# Patient Record
Sex: Female | Born: 1975
Health system: Southern US, Community
[De-identification: ages and names within clinical notes are randomized; demographics above are authoritative.]

## PROBLEM LIST (undated history)

## (undated) VITALS — BP 113/79 | HR 83 | Temp 97.5°F | Resp 16 | Ht 66.0 in | Wt 267.0 lb

## (undated) VITALS — BP 107/75 | HR 94 | Temp 98.5°F | Resp 18 | Ht 66.0 in | Wt 218.0 lb

## (undated) VITALS — BP 124/79 | HR 96 | Temp 97.7°F | Resp 16 | Ht 62.5 in | Wt 224.0 lb

## (undated) DIAGNOSIS — G709 Myoneural disorder, unspecified: Secondary | ICD-10-CM

## (undated) DIAGNOSIS — D649 Anemia, unspecified: Secondary | ICD-10-CM

## (undated) DIAGNOSIS — K219 Gastro-esophageal reflux disease without esophagitis: Secondary | ICD-10-CM

## (undated) DIAGNOSIS — E282 Polycystic ovarian syndrome: Secondary | ICD-10-CM

## (undated) DIAGNOSIS — F32A Depression, unspecified: Secondary | ICD-10-CM

## (undated) DIAGNOSIS — L989 Disorder of the skin and subcutaneous tissue, unspecified: Secondary | ICD-10-CM

## (undated) DIAGNOSIS — R011 Cardiac murmur, unspecified: Secondary | ICD-10-CM

## (undated) DIAGNOSIS — Z8701 Personal history of pneumonia (recurrent): Secondary | ICD-10-CM

## (undated) DIAGNOSIS — D229 Melanocytic nevi, unspecified: Secondary | ICD-10-CM

## (undated) DIAGNOSIS — E669 Obesity, unspecified: Secondary | ICD-10-CM

## (undated) DIAGNOSIS — R519 Headache, unspecified: Secondary | ICD-10-CM

## (undated) DIAGNOSIS — T8859XA Other complications of anesthesia, initial encounter: Secondary | ICD-10-CM

## (undated) DIAGNOSIS — J45909 Unspecified asthma, uncomplicated: Secondary | ICD-10-CM

## (undated) DIAGNOSIS — T4145XA Adverse effect of unspecified anesthetic, initial encounter: Secondary | ICD-10-CM

## (undated) DIAGNOSIS — G4730 Sleep apnea, unspecified: Secondary | ICD-10-CM

## (undated) DIAGNOSIS — F329 Major depressive disorder, single episode, unspecified: Secondary | ICD-10-CM

## (undated) DIAGNOSIS — R630 Anorexia: Secondary | ICD-10-CM

## (undated) DIAGNOSIS — J189 Pneumonia, unspecified organism: Secondary | ICD-10-CM

## (undated) DIAGNOSIS — K5900 Constipation, unspecified: Secondary | ICD-10-CM

## (undated) DIAGNOSIS — K635 Polyp of colon: Secondary | ICD-10-CM

## (undated) DIAGNOSIS — I10 Essential (primary) hypertension: Secondary | ICD-10-CM

## (undated) DIAGNOSIS — I499 Cardiac arrhythmia, unspecified: Secondary | ICD-10-CM

## (undated) DIAGNOSIS — G8929 Other chronic pain: Secondary | ICD-10-CM

## (undated) DIAGNOSIS — F419 Anxiety disorder, unspecified: Secondary | ICD-10-CM

## (undated) DIAGNOSIS — R12 Heartburn: Secondary | ICD-10-CM

## (undated) DIAGNOSIS — R51 Headache: Secondary | ICD-10-CM

## (undated) DIAGNOSIS — Z86718 Personal history of other venous thrombosis and embolism: Secondary | ICD-10-CM

## (undated) HISTORY — DX: Disorder of the skin and subcutaneous tissue, unspecified: L98.9

## (undated) HISTORY — DX: Headache: R51

## (undated) HISTORY — PX: OTHER SURGICAL HISTORY: SHX169

## (undated) HISTORY — PX: TUBAL LIGATION: SHX77

## (undated) HISTORY — DX: Unspecified asthma, uncomplicated: J45.909

## (undated) HISTORY — DX: Other chronic pain: G89.29

## (undated) HISTORY — DX: Anemia, unspecified: D64.9

## (undated) HISTORY — DX: Myoneural disorder, unspecified: G70.9

## (undated) HISTORY — DX: Personal history of pneumonia (recurrent): Z87.01

## (undated) HISTORY — DX: Personal history of other venous thrombosis and embolism: Z86.718

## (undated) HISTORY — DX: Depression, unspecified: F32.A

## (undated) HISTORY — DX: Sleep apnea, unspecified: G47.30

## (undated) HISTORY — PX: SKIN LESION EXCISION: SHX2412

## (undated) HISTORY — DX: Headache, unspecified: R51.9

## (undated) HISTORY — DX: Melanocytic nevi, unspecified: D22.9

## (undated) HISTORY — DX: Major depressive disorder, single episode, unspecified: F32.9

## (undated) HISTORY — PX: CYSTOSCOPY WITH URETHRAL DILATATION: SHX5125

## (undated) HISTORY — PX: TONSILLECTOMY: SUR1361

## (undated) HISTORY — PX: POLYPECTOMY: SHX149

## (undated) HISTORY — DX: Obesity, unspecified: E66.9

## (undated) HISTORY — DX: Anxiety disorder, unspecified: F41.9

## (undated) HISTORY — PX: BREAST SURGERY: SHX581

---

## 1997-10-10 ENCOUNTER — Ambulatory Visit (HOSPITAL_COMMUNITY): Admission: RE | Admit: 1997-10-10 | Discharge: 1997-10-10 | Payer: Self-pay | Admitting: *Deleted

## 1998-09-10 ENCOUNTER — Other Ambulatory Visit: Admission: RE | Admit: 1998-09-10 | Discharge: 1998-09-10 | Payer: Self-pay | Admitting: Obstetrics and Gynecology

## 1999-08-23 ENCOUNTER — Encounter: Payer: Self-pay | Admitting: Emergency Medicine

## 1999-08-23 ENCOUNTER — Emergency Department (HOSPITAL_COMMUNITY): Admission: EM | Admit: 1999-08-23 | Discharge: 1999-08-23 | Payer: Self-pay | Admitting: Emergency Medicine

## 2001-07-26 ENCOUNTER — Other Ambulatory Visit: Admission: RE | Admit: 2001-07-26 | Discharge: 2001-07-26 | Payer: Self-pay | Admitting: Obstetrics & Gynecology

## 2002-08-04 ENCOUNTER — Observation Stay (HOSPITAL_COMMUNITY): Admission: AD | Admit: 2002-08-04 | Discharge: 2002-08-04 | Payer: Self-pay | Admitting: *Deleted

## 2002-09-15 ENCOUNTER — Other Ambulatory Visit: Admission: RE | Admit: 2002-09-15 | Discharge: 2002-09-15 | Payer: Self-pay | Admitting: Obstetrics & Gynecology

## 2002-12-07 ENCOUNTER — Ambulatory Visit (HOSPITAL_COMMUNITY): Admission: RE | Admit: 2002-12-07 | Discharge: 2002-12-07 | Payer: Self-pay | Admitting: Obstetrics & Gynecology

## 2002-12-07 ENCOUNTER — Encounter: Payer: Self-pay | Admitting: Obstetrics & Gynecology

## 2003-03-24 ENCOUNTER — Inpatient Hospital Stay (HOSPITAL_COMMUNITY): Admission: AD | Admit: 2003-03-24 | Discharge: 2003-03-28 | Payer: Self-pay | Admitting: Obstetrics & Gynecology

## 2003-03-25 ENCOUNTER — Encounter (INDEPENDENT_AMBULATORY_CARE_PROVIDER_SITE_OTHER): Payer: Self-pay | Admitting: Specialist

## 2003-05-08 ENCOUNTER — Other Ambulatory Visit: Admission: RE | Admit: 2003-05-08 | Discharge: 2003-05-08 | Payer: Self-pay | Admitting: Obstetrics & Gynecology

## 2005-11-14 ENCOUNTER — Inpatient Hospital Stay (HOSPITAL_COMMUNITY): Admission: AD | Admit: 2005-11-14 | Discharge: 2005-11-17 | Payer: Self-pay | Admitting: Obstetrics & Gynecology

## 2006-05-01 ENCOUNTER — Ambulatory Visit: Payer: Self-pay | Admitting: Family Medicine

## 2006-05-01 ENCOUNTER — Ambulatory Visit (HOSPITAL_COMMUNITY): Admission: RE | Admit: 2006-05-01 | Discharge: 2006-05-01 | Payer: Self-pay | Admitting: Family Medicine

## 2007-01-01 ENCOUNTER — Ambulatory Visit: Payer: Self-pay | Admitting: Family Medicine

## 2007-01-05 ENCOUNTER — Encounter (INDEPENDENT_AMBULATORY_CARE_PROVIDER_SITE_OTHER): Payer: Self-pay | Admitting: *Deleted

## 2007-10-11 DIAGNOSIS — F3289 Other specified depressive episodes: Secondary | ICD-10-CM | POA: Insufficient documentation

## 2007-10-11 DIAGNOSIS — F329 Major depressive disorder, single episode, unspecified: Secondary | ICD-10-CM

## 2007-10-11 DIAGNOSIS — E669 Obesity, unspecified: Secondary | ICD-10-CM | POA: Insufficient documentation

## 2007-10-11 DIAGNOSIS — Z85828 Personal history of other malignant neoplasm of skin: Secondary | ICD-10-CM | POA: Insufficient documentation

## 2007-10-26 ENCOUNTER — Encounter: Admission: RE | Admit: 2007-10-26 | Discharge: 2007-10-26 | Payer: Self-pay | Admitting: *Deleted

## 2008-08-01 ENCOUNTER — Ambulatory Visit: Payer: Self-pay | Admitting: Family Medicine

## 2008-08-01 DIAGNOSIS — R002 Palpitations: Secondary | ICD-10-CM | POA: Insufficient documentation

## 2008-08-01 DIAGNOSIS — H547 Unspecified visual loss: Secondary | ICD-10-CM | POA: Insufficient documentation

## 2008-08-02 DIAGNOSIS — R0789 Other chest pain: Secondary | ICD-10-CM | POA: Insufficient documentation

## 2008-08-02 DIAGNOSIS — R079 Chest pain, unspecified: Secondary | ICD-10-CM | POA: Insufficient documentation

## 2008-08-03 ENCOUNTER — Ambulatory Visit (HOSPITAL_COMMUNITY): Payer: Self-pay | Admitting: Psychology

## 2008-08-15 ENCOUNTER — Ambulatory Visit (HOSPITAL_COMMUNITY): Payer: Self-pay | Admitting: Psychology

## 2008-08-16 ENCOUNTER — Encounter: Payer: Self-pay | Admitting: Family Medicine

## 2008-08-16 ENCOUNTER — Ambulatory Visit: Payer: Self-pay | Admitting: Cardiology

## 2008-08-17 ENCOUNTER — Encounter: Payer: Self-pay | Admitting: Cardiology

## 2008-08-17 ENCOUNTER — Ambulatory Visit (HOSPITAL_COMMUNITY): Admission: RE | Admit: 2008-08-17 | Discharge: 2008-08-17 | Payer: Self-pay | Admitting: Cardiology

## 2008-08-17 ENCOUNTER — Ambulatory Visit: Payer: Self-pay | Admitting: Cardiology

## 2008-08-22 ENCOUNTER — Encounter: Payer: Self-pay | Admitting: Family Medicine

## 2008-08-29 ENCOUNTER — Ambulatory Visit (HOSPITAL_COMMUNITY): Payer: Self-pay | Admitting: Psychology

## 2008-09-01 ENCOUNTER — Ambulatory Visit: Payer: Self-pay | Admitting: Family Medicine

## 2008-09-01 DIAGNOSIS — E282 Polycystic ovarian syndrome: Secondary | ICD-10-CM | POA: Insufficient documentation

## 2008-09-01 DIAGNOSIS — J0110 Acute frontal sinusitis, unspecified: Secondary | ICD-10-CM | POA: Insufficient documentation

## 2008-09-12 ENCOUNTER — Ambulatory Visit (HOSPITAL_COMMUNITY): Payer: Self-pay | Admitting: Psychology

## 2008-09-17 ENCOUNTER — Ambulatory Visit: Admission: RE | Admit: 2008-09-17 | Discharge: 2008-09-17 | Payer: Self-pay | Admitting: Neurology

## 2008-09-17 ENCOUNTER — Encounter: Payer: Self-pay | Admitting: Family Medicine

## 2008-10-10 ENCOUNTER — Ambulatory Visit (HOSPITAL_COMMUNITY): Payer: Self-pay | Admitting: Psychology

## 2008-10-24 ENCOUNTER — Ambulatory Visit (HOSPITAL_COMMUNITY): Payer: Self-pay | Admitting: Psychiatry

## 2008-11-01 ENCOUNTER — Ambulatory Visit: Payer: Self-pay | Admitting: Family Medicine

## 2008-11-15 ENCOUNTER — Ambulatory Visit (HOSPITAL_COMMUNITY): Payer: Self-pay | Admitting: Psychology

## 2008-11-16 ENCOUNTER — Ambulatory Visit (HOSPITAL_COMMUNITY): Payer: Self-pay | Admitting: Psychiatry

## 2008-12-28 ENCOUNTER — Ambulatory Visit (HOSPITAL_COMMUNITY): Payer: Self-pay | Admitting: Psychiatry

## 2009-01-11 ENCOUNTER — Ambulatory Visit (HOSPITAL_COMMUNITY): Payer: Self-pay | Admitting: Psychiatry

## 2009-01-16 ENCOUNTER — Ambulatory Visit (HOSPITAL_COMMUNITY): Payer: Self-pay | Admitting: Psychology

## 2009-02-08 ENCOUNTER — Ambulatory Visit (HOSPITAL_COMMUNITY): Payer: Self-pay | Admitting: Psychology

## 2009-03-07 ENCOUNTER — Ambulatory Visit: Payer: Self-pay | Admitting: Family Medicine

## 2009-03-08 ENCOUNTER — Ambulatory Visit (HOSPITAL_COMMUNITY): Payer: Self-pay | Admitting: Psychology

## 2009-03-27 ENCOUNTER — Ambulatory Visit (HOSPITAL_COMMUNITY): Payer: Self-pay | Admitting: Psychiatry

## 2009-04-06 ENCOUNTER — Encounter: Payer: Self-pay | Admitting: Family Medicine

## 2009-04-24 ENCOUNTER — Ambulatory Visit (HOSPITAL_COMMUNITY): Payer: Self-pay | Admitting: Psychology

## 2009-05-03 ENCOUNTER — Ambulatory Visit (HOSPITAL_COMMUNITY): Payer: Self-pay | Admitting: Psychiatry

## 2009-05-17 ENCOUNTER — Ambulatory Visit: Payer: Self-pay | Admitting: Family Medicine

## 2009-05-17 DIAGNOSIS — R7301 Impaired fasting glucose: Secondary | ICD-10-CM | POA: Insufficient documentation

## 2009-05-17 LAB — CONVERTED CEMR LAB: Blood Glucose, Fasting: 69 mg/dL

## 2009-05-22 ENCOUNTER — Ambulatory Visit (HOSPITAL_COMMUNITY): Payer: Self-pay | Admitting: Psychology

## 2009-05-23 ENCOUNTER — Encounter: Payer: Self-pay | Admitting: Family Medicine

## 2009-05-31 ENCOUNTER — Ambulatory Visit (HOSPITAL_COMMUNITY): Payer: Self-pay | Admitting: Psychiatry

## 2009-05-31 ENCOUNTER — Telehealth: Payer: Self-pay | Admitting: Family Medicine

## 2009-06-14 ENCOUNTER — Ambulatory Visit (HOSPITAL_COMMUNITY): Payer: Self-pay | Admitting: Psychiatry

## 2009-06-15 ENCOUNTER — Ambulatory Visit (HOSPITAL_COMMUNITY): Admission: RE | Admit: 2009-06-15 | Discharge: 2009-06-15 | Payer: Self-pay | Admitting: Psychiatry

## 2009-06-19 ENCOUNTER — Ambulatory Visit: Payer: Self-pay | Admitting: Psychiatry

## 2009-06-19 ENCOUNTER — Other Ambulatory Visit (HOSPITAL_COMMUNITY): Admission: RE | Admit: 2009-06-19 | Discharge: 2009-07-02 | Payer: Self-pay | Admitting: Psychiatry

## 2009-06-21 ENCOUNTER — Ambulatory Visit: Payer: Self-pay | Admitting: Family Medicine

## 2009-06-21 ENCOUNTER — Encounter: Payer: Self-pay | Admitting: Physician Assistant

## 2009-06-22 ENCOUNTER — Encounter: Payer: Self-pay | Admitting: Physician Assistant

## 2009-06-22 LAB — CONVERTED CEMR LAB
TSH: 1.809 microintl units/mL (ref 0.350–4.500)
Vit D, 25-Hydroxy: 25 ng/mL — ABNORMAL LOW (ref 30–89)

## 2009-06-29 ENCOUNTER — Encounter: Payer: Self-pay | Admitting: Family Medicine

## 2009-07-03 ENCOUNTER — Ambulatory Visit (HOSPITAL_COMMUNITY): Admission: RE | Admit: 2009-07-03 | Discharge: 2009-07-03 | Payer: Self-pay | Admitting: Surgery

## 2009-07-13 ENCOUNTER — Ambulatory Visit (HOSPITAL_COMMUNITY): Admission: RE | Admit: 2009-07-13 | Discharge: 2009-07-13 | Payer: Self-pay | Admitting: Surgery

## 2009-07-25 ENCOUNTER — Ambulatory Visit (HOSPITAL_COMMUNITY): Payer: Self-pay | Admitting: Psychology

## 2009-07-26 ENCOUNTER — Ambulatory Visit: Payer: Self-pay | Admitting: Family Medicine

## 2009-07-29 DIAGNOSIS — F411 Generalized anxiety disorder: Secondary | ICD-10-CM | POA: Insufficient documentation

## 2009-08-01 ENCOUNTER — Encounter: Payer: Self-pay | Admitting: Family Medicine

## 2009-08-08 ENCOUNTER — Ambulatory Visit (HOSPITAL_COMMUNITY): Payer: Self-pay | Admitting: Psychology

## 2009-08-09 ENCOUNTER — Ambulatory Visit (HOSPITAL_COMMUNITY): Payer: Self-pay | Admitting: Psychiatry

## 2009-08-15 ENCOUNTER — Ambulatory Visit (HOSPITAL_COMMUNITY): Admission: RE | Admit: 2009-08-15 | Discharge: 2009-08-15 | Payer: Self-pay | Admitting: Psychiatry

## 2009-08-21 ENCOUNTER — Other Ambulatory Visit (HOSPITAL_COMMUNITY): Admission: RE | Admit: 2009-08-21 | Discharge: 2009-09-04 | Payer: Self-pay | Admitting: Psychiatry

## 2009-08-22 ENCOUNTER — Encounter: Admission: RE | Admit: 2009-08-22 | Discharge: 2009-08-22 | Payer: Self-pay | Admitting: Surgery

## 2009-08-28 ENCOUNTER — Ambulatory Visit: Payer: Self-pay | Admitting: Family Medicine

## 2009-08-28 ENCOUNTER — Ambulatory Visit: Payer: Self-pay | Admitting: Psychiatry

## 2009-09-10 ENCOUNTER — Ambulatory Visit (HOSPITAL_COMMUNITY): Payer: Self-pay | Admitting: Psychology

## 2009-09-11 ENCOUNTER — Ambulatory Visit (HOSPITAL_COMMUNITY): Payer: Self-pay | Admitting: Psychiatry

## 2009-09-25 ENCOUNTER — Ambulatory Visit (HOSPITAL_COMMUNITY): Payer: Self-pay | Admitting: Psychiatry

## 2009-09-27 ENCOUNTER — Ambulatory Visit (HOSPITAL_COMMUNITY): Payer: Self-pay | Admitting: Psychiatry

## 2009-10-03 ENCOUNTER — Ambulatory Visit (HOSPITAL_COMMUNITY): Payer: Self-pay | Admitting: Psychiatry

## 2009-10-18 ENCOUNTER — Ambulatory Visit (HOSPITAL_COMMUNITY): Payer: Self-pay | Admitting: Psychiatry

## 2009-10-19 ENCOUNTER — Ambulatory Visit (HOSPITAL_COMMUNITY): Payer: Self-pay | Admitting: Psychiatry

## 2009-10-22 ENCOUNTER — Ambulatory Visit (HOSPITAL_COMMUNITY): Payer: Self-pay | Admitting: Psychiatry

## 2009-11-06 ENCOUNTER — Ambulatory Visit (HOSPITAL_COMMUNITY): Payer: Self-pay | Admitting: Psychiatry

## 2009-11-19 ENCOUNTER — Ambulatory Visit (HOSPITAL_COMMUNITY): Payer: Self-pay | Admitting: Psychiatry

## 2009-11-22 ENCOUNTER — Ambulatory Visit (HOSPITAL_COMMUNITY): Payer: Self-pay | Admitting: Psychiatry

## 2009-12-03 ENCOUNTER — Ambulatory Visit (HOSPITAL_COMMUNITY): Payer: Self-pay | Admitting: Psychiatry

## 2009-12-11 ENCOUNTER — Ambulatory Visit (HOSPITAL_COMMUNITY): Payer: Self-pay | Admitting: Psychiatry

## 2009-12-13 ENCOUNTER — Ambulatory Visit (HOSPITAL_COMMUNITY): Payer: Self-pay | Admitting: Psychiatry

## 2009-12-21 ENCOUNTER — Ambulatory Visit (HOSPITAL_COMMUNITY): Payer: Self-pay | Admitting: Psychiatry

## 2010-01-03 ENCOUNTER — Ambulatory Visit (HOSPITAL_COMMUNITY): Payer: Self-pay | Admitting: Psychiatry

## 2010-01-08 ENCOUNTER — Ambulatory Visit (HOSPITAL_COMMUNITY): Payer: Self-pay | Admitting: Psychiatry

## 2010-01-15 ENCOUNTER — Ambulatory Visit (HOSPITAL_COMMUNITY): Payer: Self-pay | Admitting: Psychiatry

## 2010-01-18 ENCOUNTER — Encounter: Payer: Self-pay | Admitting: Family Medicine

## 2010-01-22 ENCOUNTER — Ambulatory Visit: Payer: Self-pay | Admitting: Family Medicine

## 2010-01-22 ENCOUNTER — Ambulatory Visit (HOSPITAL_COMMUNITY): Payer: Self-pay | Admitting: Psychiatry

## 2010-01-22 DIAGNOSIS — R110 Nausea: Secondary | ICD-10-CM | POA: Insufficient documentation

## 2010-01-22 DIAGNOSIS — R112 Nausea with vomiting, unspecified: Secondary | ICD-10-CM | POA: Insufficient documentation

## 2010-01-22 LAB — CONVERTED CEMR LAB
BUN: 8 mg/dL (ref 6–23)
Basophils Absolute: 0 10*3/uL (ref 0.0–0.1)
Basophils Relative: 0 % (ref 0–1)
CO2: 24 meq/L (ref 19–32)
Calcium: 9.4 mg/dL (ref 8.4–10.5)
Chloride: 103 meq/L (ref 96–112)
Creatinine, Ser: 1.1 mg/dL (ref 0.40–1.20)
Eosinophils Absolute: 0.1 10*3/uL (ref 0.0–0.7)
Eosinophils Relative: 1 % (ref 0–5)
Glucose, Bld: 62 mg/dL — ABNORMAL LOW (ref 70–99)
HCT: 43.9 % (ref 36.0–46.0)
Hemoglobin: 13.8 g/dL (ref 12.0–15.0)
Lithium Lvl: 0.72 meq/L — ABNORMAL LOW (ref 0.80–1.40)
Lymphocytes Relative: 12 % (ref 12–46)
Lymphs Abs: 1.6 10*3/uL (ref 0.7–4.0)
MCHC: 31.4 g/dL (ref 30.0–36.0)
MCV: 90.9 fL (ref 78.0–100.0)
Monocytes Absolute: 0.6 10*3/uL (ref 0.1–1.0)
Monocytes Relative: 5 % (ref 3–12)
Neutro Abs: 10.8 10*3/uL — ABNORMAL HIGH (ref 1.7–7.7)
Neutrophils Relative %: 82 % — ABNORMAL HIGH (ref 43–77)
Platelets: 394 10*3/uL (ref 150–400)
Potassium: 4.2 meq/L (ref 3.5–5.3)
RBC: 4.83 M/uL (ref 3.87–5.11)
RDW: 14.7 % (ref 11.5–15.5)
Sodium: 142 meq/L (ref 135–145)
WBC: 13.2 10*3/uL — ABNORMAL HIGH (ref 4.0–10.5)

## 2010-01-23 ENCOUNTER — Telehealth: Payer: Self-pay | Admitting: Physician Assistant

## 2010-01-25 ENCOUNTER — Ambulatory Visit (HOSPITAL_COMMUNITY): Payer: Self-pay | Admitting: Psychiatry

## 2010-02-01 ENCOUNTER — Ambulatory Visit (HOSPITAL_COMMUNITY): Payer: Self-pay | Admitting: Psychiatry

## 2010-02-08 ENCOUNTER — Ambulatory Visit (HOSPITAL_COMMUNITY): Payer: Self-pay | Admitting: Psychiatry

## 2010-02-12 ENCOUNTER — Ambulatory Visit (HOSPITAL_COMMUNITY): Payer: Self-pay | Admitting: Psychiatry

## 2010-02-22 ENCOUNTER — Ambulatory Visit (HOSPITAL_COMMUNITY): Payer: Self-pay | Admitting: Psychiatry

## 2010-03-04 ENCOUNTER — Ambulatory Visit (HOSPITAL_COMMUNITY): Payer: Self-pay | Admitting: Psychiatry

## 2010-03-14 ENCOUNTER — Ambulatory Visit (HOSPITAL_COMMUNITY): Payer: Self-pay | Admitting: Psychiatry

## 2010-03-18 ENCOUNTER — Ambulatory Visit: Payer: Self-pay | Admitting: Family Medicine

## 2010-03-18 DIAGNOSIS — J209 Acute bronchitis, unspecified: Secondary | ICD-10-CM | POA: Insufficient documentation

## 2010-03-25 LAB — CONVERTED CEMR LAB
BUN: 12 mg/dL (ref 6–23)
CO2: 27 meq/L (ref 19–32)
Calcium: 8.9 mg/dL (ref 8.4–10.5)
Chloride: 104 meq/L (ref 96–112)
Cholesterol: 150 mg/dL (ref 0–200)
Creatinine, Ser: 0.99 mg/dL (ref 0.40–1.20)
Glucose, Bld: 84 mg/dL (ref 70–99)
HDL: 26 mg/dL — ABNORMAL LOW (ref >39)
Hgb A1c MFr Bld: 5.5 % (ref <5.7)
LDL Cholesterol: 95 mg/dL (ref 0–99)
Potassium: 4.3 meq/L (ref 3.5–5.3)
Sodium: 141 meq/L (ref 135–145)
Total CHOL/HDL Ratio: 5.8
Triglycerides: 145 mg/dL (ref <150)
VLDL: 29 mg/dL (ref 0–40)

## 2010-03-28 ENCOUNTER — Ambulatory Visit (HOSPITAL_COMMUNITY): Payer: Self-pay | Admitting: Psychiatry

## 2010-04-05 ENCOUNTER — Ambulatory Visit (HOSPITAL_COMMUNITY): Payer: Self-pay | Admitting: Psychiatry

## 2010-04-09 ENCOUNTER — Ambulatory Visit (HOSPITAL_COMMUNITY)
Admission: RE | Admit: 2010-04-09 | Discharge: 2010-04-09 | Payer: Self-pay | Source: Home / Self Care | Attending: Family Medicine | Admitting: Family Medicine

## 2010-04-09 ENCOUNTER — Ambulatory Visit: Payer: Self-pay | Admitting: Family Medicine

## 2010-04-09 DIAGNOSIS — J42 Unspecified chronic bronchitis: Secondary | ICD-10-CM | POA: Insufficient documentation

## 2010-04-09 LAB — CONVERTED CEMR LAB
Basophils Absolute: 0 10*3/uL (ref 0.0–0.1)
Basophils Relative: 0 % (ref 0–1)
Calcium: 9 mg/dL (ref 8.4–10.5)
Creatinine, Ser: 0.89 mg/dL (ref 0.40–1.20)
Eosinophils Absolute: 0.1 10*3/uL (ref 0.0–0.7)
Eosinophils Relative: 1 % (ref 0–5)
Glucose, Urine, Semiquant: NEGATIVE
HCT: 39.3 % (ref 36.0–46.0)
Hemoglobin: 13.1 g/dL (ref 12.0–15.0)
Lymphocytes Relative: 25 % (ref 12–46)
MCHC: 33.3 g/dL (ref 30.0–36.0)
Monocytes Absolute: 0.4 10*3/uL (ref 0.1–1.0)
Nitrite: NEGATIVE
Platelets: 226 10*3/uL (ref 150–400)
Protein, U semiquant: NEGATIVE
RDW: 12.8 % (ref 11.5–15.5)
Sodium: 139 meq/L (ref 135–145)
WBC Urine, dipstick: NEGATIVE
pH: 6.5

## 2010-04-14 DIAGNOSIS — M940 Chondrocostal junction syndrome [Tietze]: Secondary | ICD-10-CM | POA: Insufficient documentation

## 2010-04-25 ENCOUNTER — Ambulatory Visit (HOSPITAL_COMMUNITY): Payer: Self-pay | Admitting: Psychiatry

## 2010-04-30 ENCOUNTER — Ambulatory Visit (HOSPITAL_COMMUNITY): Admission: RE | Admit: 2010-04-30 | Payer: Self-pay | Source: Home / Self Care | Admitting: Psychiatry

## 2010-04-30 ENCOUNTER — Inpatient Hospital Stay (HOSPITAL_COMMUNITY)
Admission: RE | Admit: 2010-04-30 | Discharge: 2010-05-13 | Payer: Self-pay | Source: Home / Self Care | Attending: Psychiatry | Admitting: Psychiatry

## 2010-04-30 ENCOUNTER — Ambulatory Visit (HOSPITAL_COMMUNITY)
Admission: RE | Admit: 2010-04-30 | Discharge: 2010-04-30 | Payer: Self-pay | Source: Home / Self Care | Attending: Psychiatry | Admitting: Psychiatry

## 2010-05-01 LAB — COMPREHENSIVE METABOLIC PANEL
ALT: 26 U/L (ref 0–35)
AST: 19 U/L (ref 0–37)
Albumin: 3.8 g/dL (ref 3.5–5.2)
Alkaline Phosphatase: 102 U/L (ref 39–117)
BUN: 11 mg/dL (ref 6–23)
CO2: 28 mEq/L (ref 19–32)
Calcium: 9.2 mg/dL (ref 8.4–10.5)
Chloride: 101 mEq/L (ref 96–112)
Creatinine, Ser: 1 mg/dL (ref 0.4–1.2)
GFR calc Af Amer: >60 mL/min (ref >60)
GFR calc non Af Amer: >60 mL/min (ref >60)
Glucose, Bld: 95 mg/dL (ref 70–99)
Potassium: 3.5 mEq/L (ref 3.5–5.1)
Sodium: 139 mEq/L (ref 135–145)
Total Bilirubin: 0.7 mg/dL (ref 0.3–1.2)
Total Protein: 7.4 g/dL (ref 6.0–8.3)

## 2010-05-07 ENCOUNTER — Ambulatory Visit (HOSPITAL_COMMUNITY): Admit: 2010-05-07 | Payer: Self-pay | Admitting: Psychiatry

## 2010-05-13 LAB — BASIC METABOLIC PANEL
BUN: 10 mg/dL (ref 6–23)
BUN: 2 mg/dL — ABNORMAL LOW (ref 6–23)
CO2: 29 mEq/L (ref 19–32)
CO2: 30 mEq/L (ref 19–32)
Calcium: 9.3 mg/dL (ref 8.4–10.5)
Calcium: 9.2 mg/dL (ref 8.4–10.5)
Chloride: 101 mEq/L (ref 96–112)
Chloride: 102 mEq/L (ref 96–112)
Creatinine, Ser: 1.06 mg/dL (ref 0.4–1.2)
Creatinine, Ser: 1.04 mg/dL (ref 0.4–1.2)
GFR calc Af Amer: >60 mL/min (ref >60)
GFR calc Af Amer: >60 mL/min (ref >60)
GFR calc non Af Amer: 59 mL/min — ABNORMAL LOW (ref >60)
GFR calc non Af Amer: >60 mL/min (ref >60)
Glucose, Bld: 80 mg/dL (ref 70–99)
Glucose, Bld: 85 mg/dL (ref 70–99)
Potassium: 3.4 mEq/L — ABNORMAL LOW (ref 3.5–5.1)
Potassium: 3.5 mEq/L (ref 3.5–5.1)
Sodium: 140 mEq/L (ref 135–145)
Sodium: 142 mEq/L (ref 135–145)

## 2010-05-13 LAB — URINALYSIS, ROUTINE W REFLEX MICROSCOPIC
Bilirubin Urine: NEGATIVE
Hgb urine dipstick: NEGATIVE
Ketones, ur: NEGATIVE mg/dL
Nitrite: NEGATIVE
Protein, ur: NEGATIVE mg/dL
Specific Gravity, Urine: 1.007 (ref 1.005–1.030)
Urine Glucose, Fasting: NEGATIVE mg/dL
Urobilinogen, UA: 1 mg/dL (ref 0.0–1.0)
pH: 7 (ref 5.0–8.0)

## 2010-05-13 LAB — LITHIUM LEVEL: Lithium Lvl: 0.96 mEq/L (ref 0.80–1.40)

## 2010-05-13 LAB — T3, FREE: T3, Free: 2.2 pg/mL — ABNORMAL LOW (ref 2.3–4.2)

## 2010-05-13 LAB — T4, FREE: Free T4: 1.24 ng/dL (ref 0.80–1.80)

## 2010-05-13 LAB — TSH: TSH: 0.867 u[IU]/mL (ref 0.350–4.500)

## 2010-05-14 ENCOUNTER — Other Ambulatory Visit (HOSPITAL_COMMUNITY)
Admission: RE | Admit: 2010-05-14 | Discharge: 2010-05-28 | Payer: Self-pay | Source: Home / Self Care | Attending: Psychiatry | Admitting: Psychiatry

## 2010-05-19 ENCOUNTER — Encounter: Payer: Self-pay | Admitting: Surgery

## 2010-05-23 NOTE — H&P (Addendum)
NAMESHANDRELL, BODA              ACCOUNT NO.:  [id number]  MEDICAL RECORD NO.:  [id number]          PATIENT TYPE:  IPS  LOCATION:  0305                          FACILITY:  [facility]  PHYSICIAN:  Eulogio Ditch, MD DATE OF BIRTH:  [birth year]  DATE OF ADMISSION:  04/30/2010 DATE OF DISCHARGE:  05/13/2010                      PSYCHIATRIC ADMISSION ASSESSMENT   IDENTIFICATION:  This is a 35 year old married Caucasian female.  This is a voluntary admission.  HISTORY OF PRESENT ILLNESS:  Edit presents on referral from her primary psychiatrist, Dr. Kathryne Sharper with depressed mood.  It has been getting worse since October 2011.  She has attributed this to some chronic conflict with her husband who she complains of having a short temper and poor insight into her depressed illness.  She has found herself reclusive, rather irritable and rarely leaves the home.  She describes herself as agoraphobic and will typically drive the children to school and will go buy groceries early in the morning when no one else is at the store.  She otherwise avoids leaving the home and will not go around crowds.  Sometimes for long periods of time she never leaves the bed, and if the children have needs they need to go to her bed to reach her.  She recently completed the Intensive Outpatient Program in the [place] office and had been recently started on Prozac and Klonopin, but to continued to endorse significant crying spells, anhedonia, hopelessness and severe depression.  She endorsed being easily irritated, angry and agitated.  She had also cut back on the amount she was able to eat and complained of having significant nausea whenever she attempted to eat.  She was generally compliant with medications.  Having suicidal thoughts.  No homicidal thoughts.  No history of substance abuse.  PAST PSYCHIATRIC HISTORY:  Followed by Dr. Lolly Mustache in our [place] office.  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  MEDICAL EVALUATION/DIAGNOSTIC STUDIES:  This is a fully alert female in no acute distress, obese, blunted affect.  No abnormal movements.  Full PE is noted in the record.  Diagnostic studies unremarkable.  ADMITTING MENTAL STATUS EXAM:  Revealed a fully alert female who appeared to be her stated age with depressed and constricted affect. Speech normal in pace, very soft tone, barely audible at times. Thinking logical, goal directed.  Positive suicidal thoughts with a plan to overdose.  Cognition intact.  No evidence of hallucinations or delusions.  Insight and judgment preserved.  ADMITTING DIAGNOSES:  AXIS I:  Major depressive disorder, recurrent, severe. AXIS II:  Deferred. AXIS III:  Obesity. AXIS IV:  Significant issues with chronic marital discord. AXIS V:  Current 30, past year not known.  HOSPITAL COURSE:  She was admitted to our Mood Disorders Program and we elected to start her on Wellbutrin 300 mg p.o. q.a.m.  We continued her previous Lamictal 150 mg b.i.d. and her previous diazepam 10 mg p.o. b.i.d.  We increased recent Abilify to 10 mg p.o. nightly and  also started her on trazodone 150 mg p.o. nightly.  She was gradually assimilated into the milieu.  For the first week, she remained with quite a flat affect, tearful, anxious and complained of significant problems with paranoia and anxiety, particularly around anything involving group activity.  Initially, she was unable to participate much in group although she would sit in the group sessions. She also complained of being unable to go to meals at all, feeling very paranoid that people were watching her, could not stand have her back to any of the other seats in the cafeteria and needed to sit against the wall.  Would become acutely nauseated and panicky  with thoughts of attending the cafeteria, so for 3-4 days she took many of her meals in her room until we could get her symptoms under control.  She did receive a nutrition consult while she was with [place].  We remained in touch with her primary psychiatrist and our plan of treatment.  We eventually eliminated the Abilify, because as we went up in dose, she became more anxious and suffered a very fine motor tremor, even at rest.  Eliminating the Abilify eliminated those side effects. We elected to start her on Risperdal 0.5 mg b.i.d. We also decreased her Wellbutrin to 150 mg XL q.a.m. because of her persistent eating issues and anxiety and started her on lithium initially 300 mg q.a.m. and 300 mg with the evening meal.  Subsequently increased that to 300 mg q.a.m. and 600 mg with the evening meal.  Very gradually, she became calmer.  By the 12th, she was able to attend the cafeteria consistently and interact in groups.  She gradually became more relaxed.  Her affect became broader and speech much more fluent. She was actually able to express herself in group and began to speak in full sentences.  As she began to be able to eat regular meals, she also began to feel better physically.  Her lithium level was noted on January 12 to be 0.96 on 900 mg daily.  January 13, which was the first day in which she had no tears whatsoever and did not appear anxious, her affect was broader, and she reported "I feel good."  We continued to work with the Valium and the Risperdal.  The Risperdal was subsequently increased to 0.5 mg q.i.d. and she was pleased with this, felt that she had control over her symptoms throughout the day with the divided q.i.d. dosing plan.  By the 14th, she was very pleased that she was eating well, a full diet in the cafeteria, appetite much improved, although she felt like she still had some agitation in the afternoon, but that was subsequently solved with the q.i.d. dosing  pattern for the Risperdal. No more guarding or feelings of paranoia.  She was looking forward to attending our Intensive Outpatient Program and making changes at home in her schedule in what she did with the children.  Meanwhile, our counselor spoke with her husband who was pleased with her progress and Jaydence reported that her mother was coming to stay at their house to help with the children while she continued to work on her mental health issues.  DISCHARGE MENTAL STATUS EXAM:  By the 16th, she was feeling much better. Affect full.  Denying any side effects from the Risperdal.  Planning on starting our Intensive Outpatient Program.  No dangerous thoughts.  Much more relaxed and feeling positive about discharge.  DISCHARGE DIAGNOSIS:  AXIS I:  Bipolar disorder  not otherwise specified with atypical features and psychosis. AXIS II:  No diagnosis. AXIS III:  No diagnosis. AXIS IV:  Marital discord, resolving. AXIS V:  Current 58, past year not known.  DISCHARGE CONDITION:  Stable and improved.  DISCHARGE PLAN:  She is to return to our Intensive Outpatient, Partial Hospital Program on January 17 at 8:45 a.m. and will follow up with Dr. Lolly Mustache in [place] on January 31 at 10:15 a.m.  DISCHARGE MEDICATIONS: 1. Bupropion XL 150 mg q.a.m. 2. Lithium carbonate 300 mg q.a.m. and 600 mg at bedtime. 3. Risperidone 0.5 mg four times a day. 4. Colace 100 mg daily. 5. Dexilant 30 mg daily. 6. Diazepam 10 mg b.i.d. 7. Lamotrigine 150 mg twice daily. 8. Trazodone 150 mg nightly. 9. TUMS as needed for dyspepsia. 10.Albuterol inhaler 2 puffs q.6 h p.r.n. for asthma.     Margaret A. Lorin Picket, N.P.   ______________________________ Eulogio Ditch, MD    MAS/MEDQ  D:  05/16/2010  T:  05/16/2010  Job:  [id number]  Electronically Signed by Kari Baars N.P. on 05/17/2010 11:41:38 AM Electronically Signed by Eulogio Ditch  on 05/23/2010 05:31:08 AM

## 2010-05-26 LAB — CONVERTED CEMR LAB
BUN: 11 mg/dL (ref 6–23)
Basophils Absolute: 0 10*3/uL (ref 0.0–0.1)
Basophils Relative: 0 % (ref 0–1)
CO2: 27 meq/L (ref 19–32)
Calcium: 8.7 mg/dL (ref 8.4–10.5)
Chloride: 106 meq/L (ref 96–112)
Cholesterol: 173 mg/dL (ref 0–200)
Creatinine, Ser: 0.82 mg/dL (ref 0.40–1.20)
Eosinophils Absolute: 0.1 10*3/uL (ref 0.0–0.7)
Eosinophils Relative: 1 % (ref 0–5)
Glucose, Bld: 84 mg/dL (ref 70–99)
Glucose, Bld: 89 mg/dL
HCT: 43.5 % (ref 36.0–46.0)
HDL: 40 mg/dL (ref >39)
Hemoglobin: 13.8 g/dL (ref 12.0–15.0)
LDL Cholesterol: 100 mg/dL — ABNORMAL HIGH (ref 0–99)
Lymphocytes Relative: 25 % (ref 12–46)
Lymphs Abs: 2.3 10*3/uL (ref 0.7–4.0)
MCHC: 31.7 g/dL (ref 30.0–36.0)
MCV: 89.3 fL (ref 78.0–100.0)
Monocytes Absolute: 0.4 10*3/uL (ref 0.1–1.0)
Monocytes Relative: 5 % (ref 3–12)
Neutro Abs: 6.4 10*3/uL (ref 1.7–7.7)
Neutrophils Relative %: 69 % (ref 43–77)
Platelets: 337 10*3/uL (ref 150–400)
Potassium: 4.3 meq/L (ref 3.5–5.3)
RBC: 4.87 M/uL (ref 3.87–5.11)
RDW: 14.1 % (ref 11.5–15.5)
Sodium: 143 meq/L (ref 135–145)
Total CHOL/HDL Ratio: 4.3
Triglycerides: 165 mg/dL — ABNORMAL HIGH (ref <150)
VLDL: 33 mg/dL (ref 0–40)
WBC: 9.3 10*3/uL (ref 4.0–10.5)

## 2010-05-27 ENCOUNTER — Ambulatory Visit (HOSPITAL_COMMUNITY): Admit: 2010-05-27 | Payer: Self-pay | Admitting: Psychiatry

## 2010-05-28 ENCOUNTER — Ambulatory Visit (HOSPITAL_COMMUNITY): Admit: 2010-05-28 | Payer: Self-pay | Admitting: Psychiatry

## 2010-05-29 ENCOUNTER — Other Ambulatory Visit (HOSPITAL_COMMUNITY): Payer: 59 | Attending: Psychiatry | Admitting: Psychiatry

## 2010-05-29 DIAGNOSIS — E669 Obesity, unspecified: Secondary | ICD-10-CM | POA: Insufficient documentation

## 2010-05-29 DIAGNOSIS — F332 Major depressive disorder, recurrent severe without psychotic features: Secondary | ICD-10-CM | POA: Insufficient documentation

## 2010-05-30 ENCOUNTER — Other Ambulatory Visit (HOSPITAL_COMMUNITY): Payer: 59 | Admitting: Psychiatry

## 2010-05-30 ENCOUNTER — Encounter (INDEPENDENT_AMBULATORY_CARE_PROVIDER_SITE_OTHER): Payer: 59 | Admitting: Psychiatry

## 2010-05-30 DIAGNOSIS — F331 Major depressive disorder, recurrent, moderate: Secondary | ICD-10-CM

## 2010-05-30 NOTE — Assessment & Plan Note (Signed)
Summary: office visit   Vital Signs:  Patient profile:   35 year old female Menstrual status:  regular Height:      66 inches Weight:      301.75 pounds BMI:     48.88 O2 Sat:      98 % Pulse rate:   106 / minute Resp:     16 per minute  Vitals Entered By: Everitt Amber (May 17, 2009 10:22 AM) CC: Follow up chronic problems   CC:  Follow up chronic problems.  History of Present Illness: Pt has determinied that she is ready to pursue bariatric surgery and is establishin this as the reason for her visit. She has been obese since a teenager, weighed about 200 pounds at 16  and has gained weight over time since then.  Mentally she is doing well  Allergies: 1)  ! Pcn  Review of Systems      See HPI General:  Complains of fatigue; denies chills and fever. ENT:  Denies earache, hoarseness, nasal congestion, and sinus pressure. CV:  Denies chest pain or discomfort, palpitations, and swelling of feet. Resp:  Denies cough and sputum productive. GI:  Denies abdominal pain, constipation, diarrhea, nausea, and vomiting blood. GU:  Denies dysuria and urinary frequency. Psych:  Complains of anxiety, depression, and mental problems; denies easily tearful, irritability, suicidal thoughts/plans, thoughts of violence, and unusual visions or sounds; improved.  Physical Exam  General:  Well-developed,obesein no acute distress; alert,appropriate and cooperative throughout examination HEENT: No facial asymmetry,  EOMI,  TM's Clear, oropharynx  pink and moist.   Chest: CLEAR TO ASCULTATION CVS: S1, S2, No murmurs, No S3.   Abd: Soft, Nontender.  MS: Adequate ROM spine, hips, shoulders and knees.  Ext: No edema.   CNS: CN 2-12 intact, power tone and sensation normal throughout.   Skin: Intact, no visible lesions or rashes.  Psych: Good eye contact, normal affect.  Memory intact, not anxious or depressed appearing.    Impression & Recommendations:  Problem # 1:  IMPAIRED FASTING  GLUCOSE (ICD-790.21) Assessment Comment Only  Orders: Glucose, (CBG) (82962) 69, reaSSURED NOT DIABETIC  Problem # 2:  OBESITY (ICD-278.00) Assessment: Deteriorated  Ht: 66 (05/17/2009)   Wt: 301.75 (05/17/2009)   BMI: 48.88 (05/17/2009)P[T TO HAVE BARIATRIC SURGERY  Problem # 3:  DEPRESSION (ICD-311) Assessment: Improved  Her updated medication list for this problem includes:    Buspirone Hcl 15 Mg Tabs (Buspirone hcl) .Marland Kitchen... Take 1 tablet by mouth three times a day    Cymbalta 60 Mg Cpep (Duloxetine hcl) .Marland Kitchen... Take 1 capsule by mouth once a day  Complete Medication List: 1)  Buspirone Hcl 15 Mg Tabs (Buspirone hcl) .... Take 1 tablet by mouth three times a day 2)  Zolpidem Tartrate 10 Mg Tabs (Zolpidem tartrate) .... Take 1 tab by mouth at bedtime as needed 3)  Cymbalta 60 Mg Cpep (Duloxetine hcl) .... Take 1 capsule by mouth once a day 4)  Abilify 5 Mg Tabs (Aripiprazole) .... Take 1 tablet by mouth once a day 5)  Septra Ds 800-160 Mg Tabs (Sulfamethoxazole-trimethoprim) .... Take 1 tablet by mouth two times a day 6)  Tessalon Perles 100 Mg Caps (Benzonatate) .... Take 1 capsule by mouth three times a day 7)  Fluconazole 150 Mg Tabs (Fluconazole) .... Take 1 tablet by mouth once a day as needed 8)  Apothecary Syrup  .... 5 cc three times a day as needed  Patient Instructions: 1)  Please schedule a follow-up  appointment in 1 month. 2)  It is important that you exercise regularly at least 20 minutes 5 times a week. If you develop chest pain, have severe difficulty breathing, or feel very tired , stop exercising immediately and seek medical attention. 3)  You need to lose weight. Consider a lower calorie diet and regular exercise.  4)  I support the decision to have the surgery.  Laboratory Results   Blood Tests   Date/Time Received: May 17, 2009  Date/Time Reported: May 17, 2009   Glucose (fasting): 69 mg/dL   (Normal Range: 16-109)  Comments: Patient requested  because she stated that her med Abilify could elevate blood sugars

## 2010-05-30 NOTE — Assessment & Plan Note (Signed)
Summary: COUGH AND FEVER/SLJ   Vital Signs:  Patient profile:   35 year old female Menstrual status:  regular Height:      66 inches Weight:      270.50 pounds BMI:     43.82 O2 Sat:      94 % on Room air Temp:     99.2 degrees F oral Pulse rate:   89 / minute Pulse rhythm:   regular Resp:     16 per minute BP sitting:   100 / 60  (left arm)  Vitals Entered By: Adella Hare LPN (March 18, 2010 8:42 AM)  Nutrition Counseling: Patient's BMI is greater than 25 and therefore counseled on weight management options.  O2 Flow:  Room air CC: fever and cough Is Patient Diabetic? No Pain Assessment Patient in pain? no      Comments did not bring meds to ov   CC:  fever and cough.  History of Present Illness: 1 week h/o cough , fever and chills, with brown sputum, she denies sinus presure and  sore thraot. Having  excessive cough  the pt has not been doing well mentally and is currently unemployed. she is in therapy, and coninues to see psychiatry regularly, sh e has had several changes in her meds all in an effort to stabilise her. she has been unable to focus on and carry out the lifestyle changes for weight loss.  Allergies (verified): 1)  ! 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 Eyes:  Denies blurring and discharge. ENT:  Denies hoarseness and nasal congestion. CV:  Denies chest pain or discomfort, palpitations, and swelling of feet. Resp:  Complains of cough, shortness of breath, sputum productive, and wheezing. GI:  Denies abdominal pain, constipation, diarrhea, nausea, and vomiting. GU:  Denies dysuria and urinary frequency. Psych:  Complains of mental problems. Endo:  Denies cold intolerance, excessive hunger, excessive thirst, and excessive urination. Heme:  Denies abnormal  bruising and bleeding.  Physical Exam  General:  Well-developed,obese,in no acute distress; alert,appropriate and cooperative throughout examination. Pt coughing excessively. HEENT: No facial asymmetry,  EOMI, No sinus tenderness, TM's Clear, oropharynx  pink and moist.   Chest: decreased air entry bilateral crackles and wheezes CVS: S1, S2, No murmurs, No S3.   Abd: Soft, Nontender.  MS: Adequate ROM spine, hips, shoulders and knees.  Ext: No edema.   CNS: CN 2-12 intact, power tone and sensation normal throughout.   Skin: Intact, no visible lesions or rashes.  Psych: Good eye contact, flat affect.  Memory intact,  depressed appearing.    Impression & Recommendations:  Problem # 1:  ACUTE BRONCHITIS (ICD-466.0) Assessment Comment Only  Her updated medication list for this problem includes:    Septra Ds 800-160 Mg Tabs (Sulfamethoxazole-trimethoprim) .Marland Kitchen... Take 1 tablet by mouth two times a day    Tussionex Pennkinetic Er 10-8 Mg/43ml Lqcr (Hydrocod polst-chlorphen polst) ..... One teaspoon twice daily as needed for cough  Orders: T-Basic Metabolic Panel [id number])  Problem # 2:  ANXIETY DISORDER (ICD-300.00) Assessment: Deteriorated  The following medications were removed from the medication list:    Trazodone Hcl 100 Mg Tabs (Trazodone hcl) ..... One tab by mouth at bedtime Her updated medication list for this problem includes:    Wellbutrin Xl 300 Mg Xr24h-tab (Bupropion  hcl) ..... One tab by mouth once daily    Trazodone Hcl 150 Mg Tabs (Trazodone hcl) .Marland Kitchen... Take 1 tab by mouth at bedtime    Clonazepam 1 Mg Tabs (Clonazepam) .Marland Kitchen... Take 1 tablet by mouth two times a day as needed  Problem # 3:  DEPRESSION (ICD-311) Assessment: Deteriorated  The following medications were removed from the medication list:    Trazodone Hcl 100 Mg Tabs (Trazodone hcl) ..... One tab by mouth at bedtime Her updated medication list for this problem includes:    Wellbutrin Xl 300 Mg  Xr24h-tab (Bupropion hcl) ..... One tab by mouth once daily    Trazodone Hcl 150 Mg Tabs (Trazodone hcl) .Marland Kitchen... Take 1 tab by mouth at bedtime    Clonazepam 1 Mg Tabs (Clonazepam) .Marland Kitchen... Take 1 tablet by mouth two times a day as needed  Problem # 4:  OBESITY (ICD-278.00) Assessment: Deteriorated  Ht: 66 (03/18/2010)   Wt: 270.50 (03/18/2010)   BMI: 43.82 (03/18/2010)  Complete Medication List: 1)  Wellbutrin Xl 300 Mg Xr24h-tab (Bupropion hcl) .... One tab by mouth once daily 2)  Lamictal Xr 300 Mg Xr24h-tab (Lamotrigine) .... One tab by mouth once daily 3)  Trazodone Hcl 150 Mg Tabs (Trazodone hcl) .... Take 1 tab by mouth at bedtime 4)  Clonazepam 1 Mg Tabs (Clonazepam) .... Take 1 tablet by mouth two times a day as needed 5)  Septra Ds 800-160 Mg Tabs (Sulfamethoxazole-trimethoprim) .... Take 1 tablet by mouth two times a day 6)  Fluconazole 150 Mg Tabs (Fluconazole) .... Take 1 tablet by mouth once a day as needed 7)  Tussionex Pennkinetic Er 10-8 Mg/65ml Lqcr (Hydrocod polst-chlorphen polst) .... One teaspoon twice daily as needed for cough  Other Orders: T-Lipid Profile ([id number]) T- Hemoglobin A1C ([id number])  Patient Instructions: 1)  Please schedule a follow-up appointment in 3.5 to 4  months. 2)  You are being treated for bronchitis, meds are sent in 3)  I hope you feel better soon. 4)  Call and lv a msg if you need me to talk with you 5)  BMP prior to visit, ICD-9: 6)  Lipid Panel prior to visit, ICD-9: 7)  HbgA1C prior to visit, ICD-9: Prescriptions: TUSSIONEX PENNKINETIC ER 10-8 MG/5ML LQCR (HYDROCOD POLST-CHLORPHEN POLST) one teaspoon twice daily as needed for cough  #300 ml x 0   Entered and Authorized by:   Syliva Overman MD   Signed by:   Syliva Overman MD on 03/18/2010   Method used:   Printed then faxed to ...       [facility]* (retail)       [address]       [place], [place]  [address]       Ph: [phone]        Fax: [phone]   RxID:   [id number] PREDNISONE (PAK) 5 MG TABS (PREDNISONE) Use as directed  #21 x 0   Entered and Authorized by:   Syliva Overman MD   Signed by:   Syliva Overman MD on 03/18/2010   Method used:   Electronically to        [facility]* (retail)       [address]       [place], [place]  [address]       Ph: [phone]       Fax: [phone]   RxID:   [id number]  FLUCONAZOLE 150 MG TABS (FLUCONAZOLE) Take 1 tablet by mouth once a day as needed  #3 x 0   Entered and Authorized by:   Syliva Overman MD   Signed by:   Syliva Overman MD on 03/18/2010   Method used:   Electronically to        [facility]* (retail)       [address] [place], [place]  [address]       Ph: [phone]       Fax: [phone]   RxID:   [id number] SEPTRA DS 800-160 MG TABS (SULFAMETHOXAZOLE-TRIMETHOPRIM) Take 1 tablet by mouth two times a day  #20 x 0   Entered and Authorized by:   Syliva Overman MD   Signed by:   Syliva Overman MD on 03/18/2010   Method used:   Electronically to        [facility]* (retail)       [address]       [place], [place]  [address]       Ph: [phone]       Fax: [phone]   RxID:   [id number]    Orders Added: 1)  Est. Patient Level IV [55732] 2)  T-Basic Metabolic Panel [[id number]] 3)  T-Lipid Profile [80061-22930] 4)  T- Hemoglobin A1C [[id number]]

## 2010-05-30 NOTE — Assessment & Plan Note (Signed)
Summary: sick- room 1   Vital Signs:  Patient profile:   35 year old female Menstrual status:  regular Height:      66 inches Weight:      261 pounds BMI:     42.28 O2 Sat:      97 % on Room air Pulse rate:   100 / minute Resp:     16 per minute BP sitting:   108 / 70  (left arm)  Vitals Entered By: Adella Hare LPN (January 22, 2010 10:35 AM) CC: nausea, vomitting with meals, states stress induced, not eating or drinking Is Patient Diabetic? No Pain Assessment Patient in pain? no      Comments did not bring meds to ov patient states she is also on lithium two times a day but not sure of dose   CC:  nausea, vomitting with meals, states stress induced, and not eating or drinking.  History of Present Illness: Psych was changing her from Abilify to Lithium. weaned off Abilify first and depression worsened.  Then started Lithium.  Admits did "take too much medicine"  5 days ago.  Discussed with psych this am.  "Is in a better space now."  Has been having nausea and vomits every time she drinks or eats anything.  No diarrhea.  No fever or chills.  Headache, and feeling weak.  Only voiding once a day.      Current Medications (verified): 1)  Wellbutrin Xl 300 Mg Xr24h-Tab (Bupropion Hcl) .... One Tab By Mouth Once Daily 2)  Lamictal Xr 300 Mg Xr24h-Tab (Lamotrigine) .... One Tab By Mouth Once Daily 3)  Trazodone Hcl 100 Mg Tabs (Trazodone Hcl) .... One Tab By Mouth At Bedtime  Allergies (verified): 1)  ! Pcn  Past History:  Past medical history reviewed for relevance to current acute and chronic problems.  Past Medical History: Reviewed history from 08/01/2008 and no changes required. Current Problems:  PERSONAL HISTORY OTHER MALIGNANT NEOPLASM SKIN (ICD-V10.83)  DEPRESSION (ICD-311) OBESITY (ICD-278.00)  Review of Systems General:  Denies chills and fever. ENT:  Denies earache, nasal congestion, and sore throat. CV:  Denies chest pain or discomfort and  palpitations. Resp:  Denies cough and shortness of breath. GI:  Complains of nausea and vomiting; denies abdominal pain and diarrhea. MS:  Complains of muscle aches.  Physical Exam  General:  Well-developed,well-nourished,in no acute distress; alert,appropriate and cooperative throughout examination Head:  Normocephalic and atraumatic without obvious abnormalities. No apparent alopecia or balding. Ears:  External ear exam shows no significant lesions or deformities.  Otoscopic examination reveals clear canals, tympanic membranes are intact bilaterally without bulging, retraction, inflammation or discharge. Hearing is grossly normal bilaterally. Nose:  External nasal examination shows no deformity or inflammation. Nasal mucosa are pink and moist without lesions or exudates. Mouth:  Oral mucosa and oropharynx without lesions or exudates.  Teeth in good repair. Neck:  No deformities, masses, or tenderness noted. Lungs:  Normal respiratory effort, chest expands symmetrically. Lungs are clear to auscultation, no crackles or wheezes. Heart:  Normal rate and regular rhythm. S1 and S2 normal without gallop, murmur, click, rub or other extra sounds. Abdomen:  Bowel sounds positive,abdomen soft and non-tender without masses, organomegaly or hernias noted. Skin:  turgor normal.   Cervical Nodes:  No lymphadenopathy noted Psych:  Cognition and judgment appear intact. Alert and cooperative with normal attention span and concentration. No apparent delusions, illusions, hallucinations   Impression & Recommendations:  Problem # 1:  NAUSEA (ICD-787.02) Assessment  New  Her updated medication list for this problem includes:    Ondansetron 4 Mg Tbdp (Ondansetron) .Marland Kitchen... Take 1 tab every 8 hrs as needed for nausea  Orders: T-Basic Metabolic Panel [phone]) T-CBC w/Diff [id number]) T-Lithium Level (29562-13086) Admin of Therapeutic Inj  intramuscular or subcutaneous (57846) Zofran 1mg . injection  (N6295)  Problem # 2:  DEPRESSION (ICD-311) Assessment: Comment Only  The following medications were removed from the medication list:    Prozac 10 Mg Caps (Fluoxetine hcl) ..... One cap by mouth once daily Her updated medication list for this problem includes:    Wellbutrin Xl 300 Mg Xr24h-tab (Bupropion hcl) ..... One tab by mouth once daily    Trazodone Hcl 100 Mg Tabs (Trazodone hcl) ..... One tab by mouth at bedtime  Complete Medication List: 1)  Wellbutrin Xl 300 Mg Xr24h-tab (Bupropion hcl) .... One tab by mouth once daily 2)  Lamictal Xr 300 Mg Xr24h-tab (Lamotrigine) .... One tab by mouth once daily 3)  Trazodone Hcl 100 Mg Tabs (Trazodone hcl) .... One tab by mouth at bedtime 4)  Ondansetron 4 Mg Tbdp (Ondansetron) .... Take 1 tab every 8 hrs as needed for nausea  Patient Instructions: 1)  Please schedule a follow-up appointment as needed. 2)  If you worsen go to the ER. 3)  You have received a shot for nausea.  I have also prescribed this in an oral medication for you to use at home as needed. 4)  Increase fluids.  Today stick to clear fluids as we discussed.  You may gradually advance your diet to light bland foods as discussed. Prescriptions: ONDANSETRON 4 MG TBDP (ONDANSETRON) take 1 tab every 8 hrs as needed for nausea  #12 x 0   Entered and Authorized by:   Esperanza Sheets PA   Signed by:   Esperanza Sheets PA on 01/22/2010   Method used:   Electronically to        [facility] (retail)       [address] [place], [place]  [address]       Ph: [phone]       Fax: [phone]   RxID:   [id number]    Medication Administration  Injection # 1:    Medication: Zofran 1mg . injection    Diagnosis: NAUSEA (ICD-787.02)    Route: IM    Site: LUOQ gluteus    Exp Date: 10/12    Lot #: [id number]    Mfr: [email or web address]    Comments: 4mg  given    Patient tolerated injection without complications    Given by: Adella Hare LPN (January 22, 2010 11:16 AM)  Orders Added: 1)  T-Basic Metabolic Panel [60630-16010] 2)  T-CBC w/Diff [93235-57322] 3)  T-Lithium Level [80178-23440] 4)  Admin of Therapeutic Inj  intramuscular or subcutaneous [96372] 5)  Zofran 1mg . injection [J2405] 6)  Est. Patient Level III [02542]

## 2010-05-30 NOTE — Letter (Signed)
Summary: Letter  Letter   Imported By: Lind Guest 05/23/2009 14:29:21  _____________________________________________________________________  External Attachment:    Type:   Image     Comment:   External Document

## 2010-05-30 NOTE — Letter (Signed)
Summary: Laboratory/X-Ray Results  [facility]  [address]   [place], [place] [address]   Phone: [phone]  Fax: [phone]    Lab/X-Ray Results  June 22, 2009  MRN: [id number]  [facility] [address] [place], [place]  [address]    The results of your recent lab has been reviewed and were found:  Your thyroid lab test was normal.  Your Vitamin D level is a little low.  I recommend you take Vitamin D 2,000 international units  once daily.  You can purchase Vitamin D in the vitamin section at the store.  We should recheck you Vitamin D level in 2-3 months.   If you have any questions, please contact our office.     Esperanza Sheets PA

## 2010-05-30 NOTE — Assessment & Plan Note (Signed)
Summary: cough, tightness in chest/slj   Vital Signs:  Patient profile:   35 year old female Menstrual status:  regular Height:      66 inches Weight:      273 pounds BMI:     44.22 O2 Sat:      98 % on Room air Pulse rate:   86 / minute Pulse rhythm:   regular Resp:     16 per minute BP sitting:   98 / 60  (left arm)  Vitals Entered By: Adella Hare LPN (April 09, 2010 8:11 AM)  Nutrition Counseling: Patient's BMI is greater than 25 and therefore counseled on weight management options.  O2 Flow:  Room air CC: cough Is Patient Diabetic? No   CC:  cough.  History of Present Illness: 2 week h/o uncontrolled cough, pleuritic chest pain, localised reproducible right chest pain rated at a 10, since the past 3 days, difficulty moving RUE. incontinence with cough , urine even looks bloody due to pressure at times.No recent fever  for the past 1 week, has had chills last night, temp 99.8 last night.  Current Medications (verified): 1)  Wellbutrin Xl 300 Mg Xr24h-Tab (Bupropion Hcl) .... One Tab By Mouth Once Daily 2)  Lamictal Xr 300 Mg Xr24h-Tab (Lamotrigine) .... One Tab By Mouth Once Daily 3)  Trazodone Hcl 150 Mg Tabs (Trazodone Hcl) .... Take 1 Tab By Mouth At Bedtime 4)  Valium 5 Mg Tabs (Diazepam) .... One Tab By Mouth Three To Four Times Daily As Needed  Allergies (verified): 1)  ! Pcn  Review of Systems      See HPI General:  Complains of chills, fatigue, loss of appetite, malaise, sleep disorder, sweats, and weight loss. Eyes:  Denies discharge and eye pain. ENT:  Denies hoarseness, nasal congestion, postnasal drainage, sinus pressure, and sore throat. CV:  Complains of chest pain or discomfort and lightheadness; denies difficulty breathing while lying down, palpitations, shortness of breath with exertion, and swelling of feet. Resp:  Complains of cough, pleuritic, and wheezing; denies sputum productive. GI:  Denies abdominal pain, constipation, diarrhea, nausea,  and vomiting. GU:  Complains of incontinence; denies dysuria and urinary frequency. MS:  Denies joint pain, low back pain, mid back pain, and stiffness. Derm:  Denies itching, lesion(s), and rash. Neuro:  Denies headaches, seizures, and sensation of room spinning. Psych:  Denies anxiety and depression. Endo:  Denies cold intolerance, excessive hunger, excessive thirst, and excessive urination. Heme:  Denies abnormal bruising and bleeding. Allergy:  Complains of seasonal allergies; denies hives or rash and itching eyes.  Physical Exam  General:  Well-developed,obese, in respiraqtory distress due to excessive cough,alert,appropriate and cooperative throughout examination ill appearing. HEENT: No facial asymmetry,  EOMI, No sinus tenderness, TM's Clear, oropharynx  pink and moist. decreased air entry, with wheezingClear to auscultation bilaterally.  CVS: S1, S2, No murmurs, No S3.   Abd: Soft, Nontender.  MS: Adequate ROM spine, hips, shoulders and knees. reproducible right chest wall pain affecting CC 3 and 4 Ext: No edema.   CNS: CN 2-12 intact, power tone and sensation normal throughout.   Skin: Intact, no visible lesions or rashes.  Psych: Good eye contact, normal affect.  Memory intact,anxious     Impression & Recommendations:  Problem # 1:  OTHER CHRONIC BRONCHITIS (ICD-491.8) Assessment Deteriorated  Orders: CXR- 2view (CXR) T-Basic Metabolic Panel ([address]) T-CBC w/Diff ([address]-91478) Depo- Medrol 80mg  (J1040) Ketorolac-Toradol 15mg  (G9562) Admin of Therapeutic Inj  intramuscular or subcutaneous (13086)  Problem #  2:  COSTOCHONDRITIS, ACUTE (ICD-733.6) Assessment: Comment Only toradol and depomedrol administered IM  Problem # 3:  ANXIETY DISORDER (ICD-300.00) Assessment: Comment Only  The following medications were removed from the medication list:    Clonazepam 1 Mg Tabs (Clonazepam) .Marland Kitchen... Take 1 tablet by mouth two times a day as needed Her updated medication  list for this problem includes:    Wellbutrin Xl 300 Mg Xr24h-tab (Bupropion hcl) ..... One tab by mouth once daily    Trazodone Hcl 150 Mg Tabs (Trazodone hcl) .Marland Kitchen... Take 1 tab by mouth at bedtime    Valium 5 Mg Tabs (Diazepam) ..... One tab by mouth three to four times daily as needed increased anxiety due to continued cough  Problem # 4:  OBESITY (ICD-278.00) Assessment: Unchanged  Ht: 66 (04/09/2010)   Wt: 273 (04/09/2010)   BMI: 44.22 (04/09/2010)  Complete Medication List: 1)  Wellbutrin Xl 300 Mg Xr24h-tab (Bupropion hcl) .... One tab by mouth once daily 2)  Lamictal Xr 300 Mg Xr24h-tab (Lamotrigine) .... One tab by mouth once daily 3)  Trazodone Hcl 150 Mg Tabs (Trazodone hcl) .... Take 1 tab by mouth at bedtime 4)  Valium 5 Mg Tabs (Diazepam) .... One tab by mouth three to four times daily as needed 5)  Clarithromycin 500 Mg Tabs (Clarithromycin) .... Take 1 tablet by mouth two times a day 6)  Prednisone (pak) 10 Mg Tabs (Prednisone) .... Use as directed 7)  Proventil Hfa 108 (90 Base) Mcg/act Aers (Albuterol sulfate) .... 2 puffs every 6 to 8 hours as needed for wheezing or uncontrolled cough 8)  Ibuprofen 800 Mg Tabs (Ibuprofen) .... Take 1 tablet by mouth three times a day 9)  Vicodin Es 7.5-750 Mg Tabs (Hydrocodone-acetaminophen) .... Take 1 tablet by mouth two times a day as needed for severe pain 10)  Qvar 40 Mcg/act Aers (Beclomethasone dipropionate) .... One puff twice daily 11)  Dexilant 30 Mg Cpdr (Dexlansoprazole) .... Take 1 capsule by mouth once a day  Other Orders: Urinalysis ([address])  Patient Instructions: 1)  F/U as before . 2)  You are being treated for brionchitis and costochondritis with pleuritic chest pain. 3)  Toradol and depomedrol will be administered. 4)  Biaxin, prednisone 10mg  dose pack, , qvar will be given , albuterol prescribed. 5)  Ibuprofen prescribed and also a small amt of vicodin for uncontrolled pain. 6)  PLS call in 3 days if no  better for referral to pulmonary. 7)  CXR and labs today Prescriptions: DEXILANT 30 MG CPDR (DEXLANSOPRAZOLE) Take 1 capsule by mouth once a day  #30 x 0   Entered and Authorized by:   Syliva Overman MD   Signed by:   Syliva Overman MD on 04/09/2010   Method used:   Electronically to        [facility] (retail)       [address]       [place], [place]  [address]       Ph: [phone]       Fax: [phone]   RxID:   [id number] QVAR 40 MCG/ACT AERS (BECLOMETHASONE DIPROPIONATE) one puff twice daily  #1 x 0   Entered and Authorized by:   Syliva Overman MD   Signed by:   Syliva Overman MD on 04/09/2010   Method used:   Samples Given   RxID:   [id number] VICODIN ES 7.5-750 MG TABS (HYDROCODONE-ACETAMINOPHEN) Take 1 tablet by mouth two times  a day as needed for severe pain  #20 x 0   Entered and Authorized by:   Syliva Overman MD   Signed by:   Syliva Overman MD on 04/09/2010   Method used:   Printed then faxed to ...       [facility] (retail)       [address]       [place], [place]  [address]       Ph: [phone]       Fax: [phone]   RxID:   [id number] IBUPROFEN 800 MG TABS (IBUPROFEN) Take 1 tablet by mouth three times a day  #30 x 0   Entered and Authorized by:   Syliva Overman MD   Signed by:   Syliva Overman MD on 04/09/2010   Method used:   Electronically to        [facility] (retail)       [address]       [place], [place]  [address]       Ph: [phone]       Fax: [phone]   RxID:   [id number] PROVENTIL HFA 108 (90 BASE) MCG/ACT AERS (ALBUTEROL SULFATE) 2 puffs every 6 to 8 hours as needed for wheezing or uncontrolled cough  #1 x 0   Entered and Authorized by:   Syliva Overman MD   Signed by:   Syliva Overman MD on 04/09/2010   Method used:   Electronically to        [facility] (retail)        [address]       [place], [place]  [address]       Ph: [phone]       Fax: [phone]   RxID:   [id number] PREDNISONE (PAK) 10 MG TABS (PREDNISONE) Use as directed  #21 x 0   Entered and Authorized by:   Syliva Overman MD   Signed by:   Syliva Overman MD on 04/09/2010   Method used:   Electronically to        [facility] (retail)       [address]       [place], [place]  [address]       Ph: [phone]       Fax: [phone]   RxID:   [id number] CLARITHROMYCIN 500 MG TABS (CLARITHROMYCIN) Take 1 tablet by mouth two times a day  #20 x 0   Entered and Authorized by:   Syliva Overman MD   Signed by:   Syliva Overman MD on 04/09/2010   Method used:   Electronically to        [facility] (retail)       [address]       [place], [place]  [address]       Ph: [phone]       Fax: [phone]   RxID:   [id number]    Medication Administration  Injection # 1:    Medication: Depo- Medrol 80mg     Diagnosis: OTHER CHRONIC BRONCHITIS (ICD-491.8)    Route: IM    Site: RUOQ gluteus    Exp Date: 07/12    Lot #: [id number]    Mfr: Pharmacia  Patient tolerated injection without complications    Given by: Adella Hare LPN (April 09, 2010 8:55 AM)  Injection # 2:    Medication: Ketorolac-Toradol 15mg     Diagnosis: OTHER CHRONIC BRONCHITIS (ICD-491.8)    Route: IM    Site: LUOQ gluteus    Exp Date: 09/27/2011    Lot #: [id number]    Mfr: [email or web address]    Comments: toradol 60mg  given    Patient tolerated injection without complications    Given by: Adella Hare LPN (April 09, 2010 8:55 AM)  Orders Added: 1)  Est. Patient Level IV [[address]] 2)  CXR- 2view [CXR] 3)  T-Basic Metabolic Panel [80048-22910] 4)  T-CBC w/Diff [45409-81191] 5)  Depo- Medrol 80mg  [J1040] 6)  Ketorolac-Toradol 15mg  [J1885] 7)  Admin of Therapeutic Inj  intramuscular or  subcutaneous [96372] 8)  Urinalysis [81003-65000]   Qvar sample given 100529 02/26/11  Medication Administration  Injection # 1:    Medication: Depo- Medrol 80mg     Diagnosis: OTHER CHRONIC BRONCHITIS (ICD-491.8)    Route: IM    Site: RUOQ gluteus    Exp Date: 07/12    Lot #: [id number]    Mfr: Pharmacia    Patient tolerated injection without complications    Given by: Adella Hare LPN (April 09, 2010 8:55 AM)  Injection # 2:    Medication: Ketorolac-Toradol 15mg     Diagnosis: OTHER CHRONIC BRONCHITIS (ICD-491.8)    Route: IM    Site: LUOQ gluteus    Exp Date: 09/27/2011    Lot #: [id number]    Mfr: [email or web address]    Comments: toradol 60mg  given    Patient tolerated injection without complications    Given by: Adella Hare LPN (April 09, 2010 8:55 AM)  Orders Added: 1)  Est. Patient Level IV [21308] 2)  CXR- 2view [CXR] 3)  T-Basic Metabolic Panel [80048-22910] 4)  T-CBC w/Diff [65784-69629] 5)  Depo- Medrol 80mg  [J1040] 6)  Ketorolac-Toradol 15mg  [J1885] 7)  Admin of Therapeutic Inj  intramuscular or subcutaneous [96372] 8)  Urinalysis [[address]3-[address]000]   Laboratory Results   Urine Tests  Date/Time Received: April 09, 2010 10:32 AM\par  Date/Time Reported: April 09, 2010 10:32 AM   Routine Urinalysis   Color: yellow Appearance: Clear Glucose: negative   (Normal Range: Negative) Bilirubin: negative   (Normal Range: Negative) Ketone: negative   (Normal Range: Negative) Spec. Gravity: 1.025   (Normal Range: 1.003-1.035) Blood: negative   (Normal Range: Negative) pH: 6.5   (Normal Range: 5.0-8.0) Protein: negative   (Normal Range: Negative) Urobilinogen: 0.2   (Normal Range: 0-1) Nitrite: negative   (Normal Range: Negative) Leukocyte Esterace: negative   (Normal Range: Negative)

## 2010-05-30 NOTE — Progress Notes (Signed)
  Phone Note Outgoing Call   Summary of Call: Left msg on answering machine for pt to call back. Calling to check on her today, to see if she is feeling better. Initial call taken by: Esperanza Sheets PA,  January 23, 2010 8:50 AM  Follow-up for Phone Call        patient returned your call Follow-up by: Adella Hare LPN,  January 23, 2010 9:17 AM  Additional Follow-up for Phone Call Additional follow up Details #1::        Per pt she is feeling a little better.  Is able to keep small amounts of fluid down now.  Has only voided once today though when she awoke this am.  Advised pt to drink water.  If she does not begin urinating in the next 2-3 hours she needs to go to the ER for possible IV fluids. Additional Follow-up by: Esperanza Sheets PA,  January 23, 2010 1:53 PM

## 2010-05-30 NOTE — Letter (Signed)
Summary: Letter  Letter   Imported By: Lind Guest 08/01/2009 14:58:05  _____________________________________________________________________  External Attachment:    Type:   Image     Comment:   External Document

## 2010-05-30 NOTE — Progress Notes (Signed)
Summary: FORM  Phone Note Call from Patient   Summary of Call: SHE BROUGHT BY A PAPER YESTERDAY  A FORM FOR MEDICAL RECORDS FOR GAS. BYPASS SURGERY  SO THE PAPER YOU FOUND WITH NO NAME IS HERS Initial call taken by: Lind Guest,  May 31, 2009 9:42 AM  Follow-up for Phone Call        pls contact the pt and ask her to resubmit the form, since it had no name it was trashed, I do not have it.  Follow-up by: Syliva Overman MD,  June 04, 2009 6:10 AM  Additional Follow-up for Phone Call Additional follow up Details #1::        LEFT MESSAGE 2.7.11 Additional Follow-up by: Lind Guest,  June 04, 2009 8:44 AM    Additional Follow-up for Phone Call Additional follow up Details #2::    patient aware will bring another paper Follow-up by: Lind Guest,  June 04, 2009 9:37 AM

## 2010-05-30 NOTE — Assessment & Plan Note (Signed)
Summary: weight issues - room 1   Vital Signs:  Patient profile:   35 year old female Menstrual status:  regular Height:      66 inches Weight:      304 pounds BMI:     49.24 O2 Sat:      96 % on Room air Pulse rate:   118 / minute Resp:     16 per minute BP sitting:   132 / 80  (left arm)  Vitals Entered By: Adella Hare LPN (June 21, 2009 1:52 PM)  Nutrition Counseling: Patient's BMI is greater than 25 and therefore counseled on weight management options.  Serial Vital Signs/Assessments:  Time      Position  BP       Pulse  Resp  Temp     By                              88                    Esperanza Sheets PA  CC: weight Is Patient Diabetic? No Pain Assessment Patient in pain? no        CC:  weight.  History of Present Illness: Pt is here for a 1 mos follow up of her obesity.  Pt states she gave up fast food & soda 1 mos ago.  Is eating at home now instead of eating out. Feels though she needs further guidance because she has gained wt since her last visit here instead of losing.  She is hoping she will qualify for bariatric surgery.  She begain exercising 1 wk ago. 30 min treadmill & 15 stationary bike every day.  She states her depression worsened & she has been seeing her therapist more often.  They made some changes to her meds & she feels like this is improving.  She is still off work on disabiity at this time.  Current Medications (verified): 1)  Buspirone Hcl 15 Mg Tabs (Buspirone Hcl) .... Take 1 Tablet By Mouth Three Times A Day 2)  Wellbutrin Xl 300 Mg Xr24h-Tab (Bupropion Hcl) .... One Tab By Mouth Once Daily 3)  Abilify 10 Mg Tabs (Aripiprazole) .... One Tab By Mouth Once Daily  Allergies (verified): 1)  ! Pcn  Past History:  Past medical, surgical, family and social histories (including risk factors) reviewed for relevance to current acute and chronic problems.  Past Medical History: Reviewed history from 08/01/2008 and no changes  required. Current Problems:  PERSONAL HISTORY OTHER MALIGNANT NEOPLASM SKIN (ICD-V10.83)  DEPRESSION (ICD-311) OBESITY (ICD-278.00)  Past Surgical History: Reviewed history from 08/01/2008 and no changes required. Tonsillectomy (1999) Kidney surgery on the urethra - age 26 Removal of malignant mole from her left spine - infancy removal of abnormal skin lesion from post chest  Family History: Reviewed history from 08/01/2008 and no changes required. Mother aged 78, HTN, iterstitial cystitis Father aged 17: colon polyps and depression Siters :none  Brother x1 aged 40, depression  Social History: Reviewed history from 08/01/2008 and no changes required. Employed Married Children x 2 ages 80 and 3 Never Smoked Alcohol use-no Drug use-no  Review of Systems General:  Denies chills and fever. CV:  Complains of palpitations; denies chest pain or discomfort; has had some increased heart rate since change of meds. Resp:  Denies shortness of breath. GI:  Denies abdominal pain, nausea, and vomiting. Psych:  Complains of depression.  Physical Exam  General:  Well-developed,well-nourished,in no acute distress; alert,appropriate and cooperative throughout examination Head:  Normocephalic and atraumatic without obvious abnormalities. No apparent alopecia or balding. Ears:  External ear exam shows no significant lesions or deformities.  Otoscopic examination reveals clear canals, tympanic membranes are intact bilaterally without bulging, retraction, inflammation or discharge. Hearing is grossly normal bilaterally. Nose:  External nasal examination shows no deformity or inflammation. Nasal mucosa are pink and moist without lesions or exudates. Mouth:  Oral mucosa and oropharynx without lesions or exudates.  Teeth in good repair. Neck:  No deformities, masses, or tenderness noted. Lungs:  Normal respiratory effort, chest expands symmetrically. Lungs are clear to auscultation, no crackles or  wheezes. Heart:  Normal rate and regular rhythm. S1 and S2 normal without gallop, murmur, click, rub or other extra sounds. Cervical Nodes:  No lymphadenopathy noted Psych:  Cognition and judgment appear intact. Alert and cooperative with normal attention span and concentration. No apparent delusions, illusions, hallucinations   Impression & Recommendations:  Problem # 1:  DEPRESSION (ICD-311) Assessment Unchanged  Continue follow up with therapist & psych as planned.   The following medications were removed from the medication list:    Cymbalta 60 Mg Cpep (Duloxetine hcl) .Marland Kitchen... Take 1 capsule by mouth once a day Her updated medication list for this problem includes:    Buspirone Hcl 15 Mg Tabs (Buspirone hcl) .Marland Kitchen... Take 1 tablet by mouth three times a day    Wellbutrin Xl 300 Mg Xr24h-tab (Bupropion hcl) ..... One tab by mouth once daily  Orders: T-TSH ([phone]) T-Vitamin D (25-Hydroxy) [phone])  Problem # 2:  OBESITY (ICD-278.00) Assessment: Deteriorated  Ht: 66 (06/21/2009)   Wt: 304 (06/21/2009)   BMI: 49.24 (06/21/2009)  Discussed serving sizes, increased fruits & veggies, lean meats and lower carb diet.  1600 calorie diet info given. Encouraged pt to continue exercising regularly.  Orders: T-TSH ([id number])  Problem # 3:  IMPAIRED FASTING GLUCOSE (ICD-790.21) Assessment: Unchanged  Continue with wt loss efforts & exercising regularly.  Complete Medication List: 1)  Buspirone Hcl 15 Mg Tabs (Buspirone hcl) .... Take 1 tablet by mouth three times a day 2)  Wellbutrin Xl 300 Mg Xr24h-tab (Bupropion hcl) .... One tab by mouth once daily 3)  Abilify 10 Mg Tabs (Aripiprazole) .... One tab by mouth once daily  Patient Instructions: 1)  Please schedule a follow-up appointment in 1 month. 2)  It is important that you exercise regularly at least 20 minutes 5 times a week. If you develop chest pain, have severe difficulty breathing, or feel very tired , stop  exercising immediately and seek medical attention. 3)  You need to lose weight. Consider a lower calorie diet and regular exercise.

## 2010-05-30 NOTE — Letter (Signed)
Summary: MEDICAL RELEASE  MEDICAL RELEASE   Imported By: Lind Guest 01/18/2010 09:38:36  _____________________________________________________________________  External Attachment:    Type:   Image     Comment:   External Document

## 2010-05-30 NOTE — Assessment & Plan Note (Signed)
Summary: office visit   Vital Signs:  Patient profile:   35 year old female Menstrual status:  regular Height:      66 inches Weight:      297 pounds BMI:     48.11 O2 Sat:      97 % Pulse rate:   77 / minute Pulse rhythm:   regular Resp:     16 per minute BP sitting:   130 / 82  (left arm) Cuff size:   xl  Vitals Entered By: Everitt Amber LPN (July 26, 2009 12:55 PM)  Nutrition Counseling: Patient's BMI is greater than 25 and therefore counseled on weight management options. CC: Follow up chronic problems   CC:  Follow up chronic problems.  History of Present Illness: Pt was off work for 3 weeks, and has returned in the past 2 weeks, she is having severe anxiety and panic attacks  and will continue with mental health,  She sleeps 4 hours  per night she is reading , can enjoy a movie, and really enjoys her job, but just feels overwhelmed.no apetie  Regular baxk pain, left knee and left hip.  On path for weight reduction surgery, and is here for her monthly check for nutrition counselling, and encouragement with exercise and to be weighed. At this time she reports poor eationg habits, eating very little , missing meals, which she states her norm when she is mentally stressed. She is coping with extreme anxiety which is currently uncontroled, she is hoping to have xanax added by her psychiatrist. Although her reports fromher supervisors are glowing sh is currently doubting her ability to cope alot .     Allergies: 1)  ! Pcn  Review of Systems      See HPI General:  Complains of fatigue; denies chills and fever. Eyes:  Denies blurring and discharge. ENT:  Denies earache, hoarseness, nasal congestion, sinus pressure, and sore throat. CV:  Denies chest pain or discomfort, palpitations, and swelling of feet. Resp:  Denies cough and sputum productive. GI:  Denies abdominal pain, constipation, diarrhea, nausea, and vomiting. GU:  Denies dysuria and urinary frequency; pap  outstanding , sh will sheduleg. MS:  Denies joint pain and stiffness. Psych:  Complains of anxiety, depression, easily tearful, and mental problems; denies suicidal thoughts/plans, unusual visions or sounds, and thoughts /plans of harming others.  Physical Exam  General:  Well-developed,obese,in no acute distress; alert,appropriate and cooperative throughout examination HEENT: No facial asymmetry,  EOMI, No sinus tenderness, TM's Clear, oropharynx  pink and moist.   Chest: Clear to auscultation bilaterally.  CVS: S1, S2, No murmurs, No S3.   Abd: Soft, Nontender.  MS: Adequate ROM spine, hips, shoulders and knees.  Ext: No edema.   CNS: CN 2-12 intact, power tone and sensation normal throughout.   Skin: Intact, no visible lesions or rashes.  Psych: Good eye contact, normal affect.  Memory intact, mildly  anxious not depressed appearing.    Impression & Recommendations:  Problem # 1:  DEPRESSION (ICD-311) Assessment Improved  Her updated medication list for this problem includes:    Buspirone Hcl 15 Mg Tabs (Buspirone hcl) .Marland Kitchen... Take 1 tablet by mouth three times a day    Wellbutrin Xl 300 Mg Xr24h-tab (Bupropion hcl) ..... One tab by mouth once daily  Problem # 2:  OBESITY (ICD-278.00) Assessment: Improved  Ht: 66 (07/26/2009)   Wt: 297 (07/26/2009)   BMI: 48.11 (07/26/2009)  Problem # 3:  ANXIETY DISORDER (ICD-300.00) Assessment: Deteriorated  Her updated medication list for this problem includes:    Buspirone Hcl 15 Mg Tabs (Buspirone hcl) .Marland Kitchen... Take 1 tablet by mouth three times a day    Wellbutrin Xl 300 Mg Xr24h-tab (Bupropion hcl) ..... One tab by mouth once daily  Complete Medication List: 1)  Buspirone Hcl 15 Mg Tabs (Buspirone hcl) .... Take 1 tablet by mouth three times a day 2)  Wellbutrin Xl 300 Mg Xr24h-tab (Bupropion hcl) .... One tab by mouth once daily 3)  Abilify 10 Mg Tabs (Aripiprazole) .... One tab by mouth once daily  Patient Instructions: 1)   Please schedule a follow-up appointment in 1 month. 2)  It is important that you exercise regularly at least 20 minutes 5 times a week. If you develop chest pain, have severe difficulty breathing, or feel very tired , stop exercising immediately and seek medical attention. 3)  You need to lose weight. Consider a lower calorie diet and regular exercise. Congrats on 7 pound weight loss keep it up pls.  Appended Document: office visit pls call pt and see if she is willing to start a food diary as a PART OF HER WEIGHT LOSS PROGRAM , IF SHE IS TELL HER I encourage this, and ask that she bring it to her next ov,Total cal /day goal s 12 to 1600  Appended Document: office visit called patient, no answer  Appended Document: office visit called patient, left message, mailed letter

## 2010-05-30 NOTE — Progress Notes (Signed)
Summary: CALL BACK  Phone Note Call from Patient   Summary of Call: THE # TO CALL HER BACK AT [phone] Initial call taken by: Lind Guest,  January 23, 2010 11:06 AM

## 2010-05-30 NOTE — Progress Notes (Signed)
Summary: DR. Ovidio Kin  DR. DAVID NEWMAN   Imported By: Lind Guest 07/16/2009 11:03:19  _____________________________________________________________________  External Attachment:    Type:   Image     Comment:   External Document

## 2010-05-30 NOTE — Assessment & Plan Note (Signed)
Summary: weight check - room 3   Vital Signs:  Patient profile:   35 year old female Menstrual status:  regular Height:      66 inches Weight:      289 pounds BMI:     46.81 O2 Sat:      97 % on Room air Pulse rate:   112 / minute Resp:     16 per minute BP sitting:   96 / 60  (left arm)  Vitals Entered By: Adella Hare LPN (Aug 28, 2692 1:42 PM)  Nutrition Counseling: Patient's BMI is greater than 25 and therefore counseled on weight management options. CC: weight check Is Patient Diabetic? No Pain Assessment Patient in pain? no        CC:  weight check.  History of Present Illness: Pt states she is doing a little better emotionally with recent change in meds.  Is in "extensive outpt therapy."  Is currently out of work due to her mental health.  Is scheduled to rtw the end of May depending on how she is doing.  Psych Dr won't sign off for her to have bariatric surgery though.  Is keeping a food diary now, but didn't bring it.  Not counting her calories though, just journaling what she does eat.  States she isn't eating much, is skipping meals.  When she does feel like eating she over eats. Not exercising regularly. On avg is walking 30-45 min twice a week.  Her goal is to do it every day but she has been unable to get herself to do this.      Current Medications (verified): 1)  Wellbutrin Xl 300 Mg Xr24h-Tab (Bupropion Hcl) .... One Tab By Mouth Once Daily 2)  Abilify 10 Mg Tabs (Aripiprazole) .... One Tab By Mouth Once Daily 3)  Prozac 10 Mg Caps (Fluoxetine Hcl) .... One Cap By Mouth Once Daily  Allergies (verified): 1)  ! Pcn  Past History:  Past medical history reviewed for relevance to current acute and chronic problems.  Past Medical History: Reviewed history from 08/01/2008 and no changes required. Current Problems:  PERSONAL HISTORY OTHER MALIGNANT NEOPLASM SKIN (ICD-V10.83)  DEPRESSION (ICD-311) OBESITY (ICD-278.00)  Review of Systems GI:   Complains of loss of appetite; denies abdominal pain and change in bowel habits. Psych:  Complains of anxiety and depression.  Physical Exam  General:  Well-developed,well-nourished,in no acute distress; alert,appropriate and cooperative throughout examination Head:  Normocephalic and atraumatic without obvious abnormalities. No apparent alopecia or balding. Ears:  External ear exam shows no significant lesions or deformities.  Otoscopic examination reveals clear canals, tympanic membranes are intact bilaterally without bulging, retraction, inflammation or discharge. Hearing is grossly normal bilaterally. Nose:  External nasal examination shows no deformity or inflammation. Nasal mucosa are pink and moist without lesions or exudates. Mouth:  Oral mucosa and oropharynx without lesions or exudates.  Teeth in good repair. Neck:  No deformities, masses, or tenderness noted. Lungs:  Normal respiratory effort, chest expands symmetrically. Lungs are clear to auscultation, no crackles or wheezes. Heart:  Normal rate and regular rhythm. S1 and S2 normal without gallop, murmur, click, rub or other extra sounds.   Impression & Recommendations:  Problem # 1:  OBESITY (ICD-278.00) Assessment Improved Pt has lost another 8 #. Discussed food choices, increasing fruits & veggies, and to prepare these ahead of time so they are quick & easy to grab. Also discussed with pt to increase her walking by 1 day every 1-2 weeks,  until she reaches her goal of walking every day.  Continue to keep food diary, but also start counting calories. Also encouraged pt to bring diary to her next appt.  Problem # 2:  ANXIETY DISORDER (ICD-300.00) Assessment: Comment Only  The following medications were removed from the medication list:    Buspirone Hcl 15 Mg Tabs (Buspirone hcl) .Marland Kitchen... Take 1 tablet by mouth three times a day Her updated medication list for this problem includes:    Wellbutrin Xl 300 Mg Xr24h-tab (Bupropion  hcl) ..... One tab by mouth once daily    Prozac 10 Mg Caps (Fluoxetine hcl) ..... One cap by mouth once daily  Complete Medication List: 1)  Wellbutrin Xl 300 Mg Xr24h-tab (Bupropion hcl) .... One tab by mouth once daily 2)  Abilify 10 Mg Tabs (Aripiprazole) .... One tab by mouth once daily 3)  Prozac 10 Mg Caps (Fluoxetine hcl) .... One cap by mouth once daily  Patient Instructions: 1)  Please schedule a follow-up appointment in 1 month. 2)  Congratulations on your weight loss! 3)  It is important that you exercise regularly at least 20 minutes 5 times a week. If you develop chest pain, have severe difficulty breathing, or feel very tired , stop exercising immediately and seek medical attention.  Laboratory Results   Urine Tests   Date/Time Reported:

## 2010-05-31 ENCOUNTER — Other Ambulatory Visit (HOSPITAL_COMMUNITY): Payer: 59 | Admitting: Psychiatry

## 2010-06-03 ENCOUNTER — Other Ambulatory Visit (HOSPITAL_COMMUNITY): Payer: 59 | Admitting: Psychiatry

## 2010-06-04 ENCOUNTER — Other Ambulatory Visit (HOSPITAL_COMMUNITY): Payer: 59 | Admitting: Psychiatry

## 2010-06-04 ENCOUNTER — Encounter (HOSPITAL_COMMUNITY): Payer: 59 | Admitting: Psychiatry

## 2010-06-04 DIAGNOSIS — F332 Major depressive disorder, recurrent severe without psychotic features: Secondary | ICD-10-CM

## 2010-06-04 DIAGNOSIS — F410 Panic disorder [episodic paroxysmal anxiety] without agoraphobia: Secondary | ICD-10-CM

## 2010-06-05 ENCOUNTER — Other Ambulatory Visit (HOSPITAL_COMMUNITY): Payer: 59 | Admitting: Psychiatry

## 2010-06-05 NOTE — Discharge Summary (Signed)
[name], [name]              ACCOUNT NO.:  [id number]  MEDICAL RECORD NO.:  [id number]          PATIENT TYPE:  IPS  LOCATION:  0305                          FACILITY:  [facility]  PHYSICIAN:  Eulogio Ditch, MD DATE OF BIRTH:  [birth year]  DATE OF ADMISSION:  04/30/2010 DATE OF DISCHARGE:  05/13/2010                              DISCHARGE SUMMARY   IDENTIFYING INFORMATION:  35 year old married Caucasian female, voluntary admission.  HISTORY OF PRESENT ILLNESS:  Katelyn [name] presents on referral from her primary psychiatrist Katelyn [name], with depressed mood that had been getting worse since October 2011 and she attributed this to chronic conflict with her husband who she complained of a short temper and rather poor insight into her depression.  She admits to becoming increasingly reclusive, rather irritable and rarely leaving home. Describes herself as agoraphobic and would typically drive the children to school and go buy groceries early in the morning when no one else was around.  Otherwise would avoid leaving her home and not going to crowds. Oftentimes she would remain in her bedroom for long periods of time and if the children needed something she would call them into her room, including playing games and reading books all from her own bed.  She was not able to tolerate going into crowds and reported feeling guarded and suspicious, feeling that she was being watched.  She had recently completed the intensive outpatient program, but continued to endorse significant crying spells, anhedonia and hopelessness.  No history of substance abuse.  She endorsed having a lot of difficulty eating and becoming extremely anxious and having nausea whenever she ate.  Medical evaluation physical exam was performed and noted in the record.  Fully alert female in no acute distress, obese, diagnostic studies unremarkable.  Vital signs were normal.  ADMITTING MENTAL STATUS EXAM:  Revealed a  fully alert female appearing to be of stated age with depressed and constricted affect.  Speech normal in pace, very soft tone, barely audible at times.  Thinking logical, goal directed.  Endorsing mild paranoia whenever she tried to get out around people, positive suicidal thoughts with a plan to overdose.  No evidence of hallucinations or delusions, did not appear to be internally distracted and affect rather rigid, appearing anxious but did not appear to be significantly guarded.  ADMITTING DIAGNOSIS:  AXIS I: Major depression, recurrent, severe. AXIS II: Deferred. AXIS III: Obesity. AXIS IV: Significant issues with marital discord.  COURSE OF HOSPITALIZATION: Transcriptionist I am going to have this discharge summary eliminated is this is an error, this is a duplicate transcription.  DICTATION ENDED HERE.     Margaret A. Lorin Picket, N.P.   ______________________________ Eulogio Ditch, MD    MAS/MEDQ  D:  06/03/2010  T:  06/03/2010  Job:  [id number]  Electronically Signed by Kari Baars N.P. on 06/04/2010 10:10:57 AM Electronically Signed by Eulogio Ditch  on 06/05/2010 09:18:20 AM NAME:  Katelyn [name], Katelyn [name]              ACCOUNT NO.:  [id number]  MEDICAL RECORD NO.:  [id number]          PATIENT TYPE:  IPS  LOCATION:  0305                          [facility]ACILITY[facility]  [facility]  PHYSICIAN:  Eulogio Ditch, MD DATE OF BIRTH:  Jun 16, 1975  DATE OF ADMISSION:  04/30/2010 DATE OF DISCHARGE:  05/13/2010                      PSYCHIATRIC ADMISSION ASSESSMENT   IDENTIFICATION:  This is a 35 year old married Caucasian female.  This is a voluntary admission.  HISTORY OF PRESENT ILLNESS:  Katelyn [name] presents on referral from her primary psychiatrist, Katelyn [name] with depressed mood.  It has been getting worse since October 2011.  She has attributed this to some chronic conflict with her husband who she complains of having a short temper and poor insight into her depressed illness.  She  has found herself reclusive, rather irritable and rarely leaves the home.  She describes herself as agoraphobic and will typically drive the children to school and will go buy groceries early in the morning when no one else is at the store.  She otherwise avoids leaving the home and will not go around crowds.  Sometimes for long periods of time she never leaves the bed, and if the children have needs they need to go to her bed to reach her.  She recently completed the Intensive Outpatient Program in the [place] office and had been recently started on Prozac and Klonopin, but to continued to endorse significant crying spells, anhedonia, hopelessness and severe depression.  She endorsed being easily irritated, angry and agitated.  She had also cut back on the amount she was able to eat and complained of having significant nausea whenever she attempted to eat.  She was generally compliant with medications.  Having suicidal thoughts.  No homicidal thoughts.  No history of substance abuse.  PAST PSYCHIATRIC HISTORY:  Followed by Katelyn [name] in our [place] office.  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  MEDICAL EVALUATION/DIAGNOSTIC STUDIES:  This is a fully alert female in no acute distress, obese, blunted affect.  No abnormal movements.  Full PE is noted in the record.  Diagnostic studies unremarkable.  ADMITTING MENTAL STATUS EXAM:  Revealed a fully alert female who appeared to be her stated age with depressed and constricted affect. Speech normal in pace, very soft tone, barely audible at times. Thinking logical, goal directed.  Positive suicidal thoughts with a plan to overdose.  Cognition intact.  No evidence of hallucinations or delusions.  Insight and judgment preserved.  ADMITTING DIAGNOSES:  AXIS I:  Major  depressive disorder, recurrent, severe. AXIS II:  Deferred. AXIS III:  Obesity. AXIS IV:  Significant issues with chronic marital discord. AXIS V:  Current 30, past year not known.  HOSPITAL COURSE:  She was admitted to our Mood Disorders Program and we elected to start her on Wellbutrin 300 mg p.o. q.a.m.  We continued her previous Lamictal 150 mg b.i.d. and her previous diazepam 10 mg p.o. b.i.d.  We increased recent Abilify to 10 mg p.o. nightly and also started her on trazodone 150 mg p.o. nightly.  She was gradually assimilated into the milieu.  For the first week, she remained with quite a flat affect, tearful, anxious and complained of significant problems with paranoia and anxiety,  particularly around anything involving group activity.  Initially, she was unable to participate much in group although she would sit in the group sessions. She also complained of being unable to go to meals at all, feeling very paranoid that people were watching her, could not stand have her back to any of the other seats in the cafeteria and needed to sit against the wall.  Would become acutely nauseated and panicky with thoughts of attending the cafeteria, so for 3-4 days she took many of her meals in her room until we could get her symptoms under control.  She did receive a nutrition consult while she was with [place].  We remained in touch with her primary psychiatrist and our plan of treatment.  We eventually eliminated the Abilify, because as we went up in dose, she became more anxious and suffered a very fine motor tremor, even at rest.  Eliminating the Abilify eliminated those side effects. We elected to start her on Risperdal 0.5 mg b.i.d. We also decreased her Wellbutrin to 150 mg XL q.a.m. because of her persistent eating issues and anxiety and started her on lithium initially 300 mg q.a.m. and 300 mg with the evening meal.  Subsequently increased that to 300 mg q.a.m. and 600 mg with the  evening meal.  Very gradually, she became calmer.  By the 12th, she was able to attend the cafeteria consistently and interact in groups.  She gradually became more relaxed.  Her affect became broader and speech much more fluent. She was actually able to express herself in group and began to speak in full sentences.  As she began to be able to eat regular meals, she also began to feel better physically.  Her lithium level was noted on January 12 to be 0.96 on 900 mg daily.  January 13, which was the first day in which she had no tears whatsoever and did not appear anxious, her affect was broader, and she reported "I feel good."  We continued to work with the Valium and the Risperdal.  The Risperdal was subsequently increased to 0.5 mg q.i.d. and she was pleased with this, felt that she had control over her symptoms throughout the day with the divided q.i.d. dosing plan.  By the 14th, she was very pleased that she was eating well, a full diet in the cafeteria, appetite much improved, although she felt like she still had some agitation in the afternoon, but that was subsequently solved with the q.i.d. dosing pattern for the Risperdal. No more guarding or feelings of paranoia.  She was looking forward to attending our Intensive Outpatient Program and making changes at home in her schedule in what she did with the children.  Meanwhile, our counselor spoke with her husband who was pleased with her progress and Katelyn [name] reported that her mother was coming to stay at their house to help with the children while she continued to work on her mental health issues.  DISCHARGE MENTAL STATUS EXAM:  By the 16th, she was feeling much better. Affect full.  Denying any side effects from the Risperdal.  Planning on starting our Intensive Outpatient Program.  No dangerous thoughts.  Much more relaxed and feeling positive about discharge.  DISCHARGE DIAGNOSIS:  AXIS I:  Bipolar disorder not otherwise  specified with atypical features and psychosis. AXIS II:  No diagnosis. AXIS III:  No diagnosis. AXIS IV:  Marital discord, resolving. AXIS V:  Current 58, past year not known.  DISCHARGE CONDITION:  Stable and improved.  DISCHARGE PLAN:  She is to return to our Intensive Outpatient, Partial Hospital Program on January 17 at 8:45 a.m. and will follow up with Katelyn [name] in [place] on January 31 at 10:15 a.m.  DISCHARGE MEDICATIONS: 1. Bupropion XL 150 mg q.a.m. 2. Lithium carbonate 300 mg q.a.m. and 600 mg at bedtime. 3. Risperidone 0.5 mg four times a day. 4. Colace 100 mg daily. 5. Dexilant 30 mg daily. 6. Diazepam 10 mg b.i.d. 7. Lamotrigine 150 mg twice daily. 8. Trazodone 150 mg nightly. 9. TUMS as needed for dyspepsia. 10.Albuterol inhaler 2 puffs q.6 h p.r.n. for asthma.     Margaret A. Lorin Picket, N.P.   ______________________________ Eulogio Ditch, MD    MAS/MEDQ  D:  05/16/2010  T:  05/16/2010  Job:  [id number]  Electronically Signed by Kari Baars N.P. on 05/17/2010 11:41:38 AM Electronically Signed by Eulogio Ditch  on 05/23/2010 05:31:08 AM

## 2010-06-06 ENCOUNTER — Other Ambulatory Visit (HOSPITAL_COMMUNITY): Payer: 59 | Admitting: Psychiatry

## 2010-06-06 ENCOUNTER — Encounter (HOSPITAL_COMMUNITY): Payer: 59 | Admitting: Psychiatry

## 2010-06-06 DIAGNOSIS — F332 Major depressive disorder, recurrent severe without psychotic features: Secondary | ICD-10-CM

## 2010-06-06 DIAGNOSIS — F410 Panic disorder [episodic paroxysmal anxiety] without agoraphobia: Secondary | ICD-10-CM

## 2010-06-07 ENCOUNTER — Other Ambulatory Visit (HOSPITAL_COMMUNITY): Payer: 59 | Admitting: Psychiatry

## 2010-06-10 ENCOUNTER — Other Ambulatory Visit (HOSPITAL_COMMUNITY): Payer: 59 | Admitting: Psychiatry

## 2010-06-11 ENCOUNTER — Other Ambulatory Visit (HOSPITAL_COMMUNITY): Payer: 59 | Admitting: Psychiatry

## 2010-06-12 ENCOUNTER — Other Ambulatory Visit (HOSPITAL_COMMUNITY): Payer: 59 | Admitting: Psychiatry

## 2010-06-13 ENCOUNTER — Other Ambulatory Visit (HOSPITAL_COMMUNITY): Payer: 59 | Admitting: Psychiatry

## 2010-06-13 ENCOUNTER — Encounter (HOSPITAL_COMMUNITY): Payer: 59 | Admitting: Psychiatry

## 2010-06-13 DIAGNOSIS — F332 Major depressive disorder, recurrent severe without psychotic features: Secondary | ICD-10-CM

## 2010-06-13 DIAGNOSIS — F410 Panic disorder [episodic paroxysmal anxiety] without agoraphobia: Secondary | ICD-10-CM

## 2010-06-14 ENCOUNTER — Other Ambulatory Visit (HOSPITAL_COMMUNITY): Payer: 59 | Admitting: Psychiatry

## 2010-06-17 ENCOUNTER — Other Ambulatory Visit (HOSPITAL_COMMUNITY): Payer: 59 | Admitting: Psychiatry

## 2010-06-18 ENCOUNTER — Encounter (HOSPITAL_COMMUNITY): Payer: 59 | Admitting: Psychiatry

## 2010-06-18 ENCOUNTER — Other Ambulatory Visit (HOSPITAL_COMMUNITY): Payer: 59 | Admitting: Psychiatry

## 2010-06-20 ENCOUNTER — Encounter (HOSPITAL_COMMUNITY): Payer: 59 | Admitting: Psychiatry

## 2010-06-20 DIAGNOSIS — F332 Major depressive disorder, recurrent severe without psychotic features: Secondary | ICD-10-CM

## 2010-06-20 DIAGNOSIS — F410 Panic disorder [episodic paroxysmal anxiety] without agoraphobia: Secondary | ICD-10-CM

## 2010-06-27 ENCOUNTER — Encounter (HOSPITAL_COMMUNITY): Payer: 59 | Admitting: Psychiatry

## 2010-06-27 DIAGNOSIS — F410 Panic disorder [episodic paroxysmal anxiety] without agoraphobia: Secondary | ICD-10-CM

## 2010-06-27 DIAGNOSIS — F332 Major depressive disorder, recurrent severe without psychotic features: Secondary | ICD-10-CM

## 2010-06-28 ENCOUNTER — Encounter: Payer: Self-pay | Admitting: Family Medicine

## 2010-07-01 ENCOUNTER — Encounter: Payer: Self-pay | Admitting: Family Medicine

## 2010-07-01 ENCOUNTER — Ambulatory Visit (INDEPENDENT_AMBULATORY_CARE_PROVIDER_SITE_OTHER): Payer: 59 | Admitting: Family Medicine

## 2010-07-01 DIAGNOSIS — F3289 Other specified depressive episodes: Secondary | ICD-10-CM

## 2010-07-01 DIAGNOSIS — E669 Obesity, unspecified: Secondary | ICD-10-CM

## 2010-07-01 DIAGNOSIS — F329 Major depressive disorder, single episode, unspecified: Secondary | ICD-10-CM

## 2010-07-02 ENCOUNTER — Encounter (INDEPENDENT_AMBULATORY_CARE_PROVIDER_SITE_OTHER): Payer: 59 | Admitting: Psychiatry

## 2010-07-02 DIAGNOSIS — F331 Major depressive disorder, recurrent, moderate: Secondary | ICD-10-CM

## 2010-07-04 ENCOUNTER — Encounter (HOSPITAL_COMMUNITY): Payer: 59 | Admitting: Psychiatry

## 2010-07-05 ENCOUNTER — Encounter (HOSPITAL_COMMUNITY): Payer: 59 | Admitting: Psychiatry

## 2010-07-05 DIAGNOSIS — F410 Panic disorder [episodic paroxysmal anxiety] without agoraphobia: Secondary | ICD-10-CM

## 2010-07-05 DIAGNOSIS — F332 Major depressive disorder, recurrent severe without psychotic features: Secondary | ICD-10-CM

## 2010-07-08 LAB — URINALYSIS, ROUTINE W REFLEX MICROSCOPIC
Bilirubin Urine: NEGATIVE
Glucose, UA: NEGATIVE mg/dL
Hgb urine dipstick: NEGATIVE
Nitrite: NEGATIVE
Protein, ur: NEGATIVE mg/dL
Specific Gravity, Urine: 1.018 (ref 1.005–1.030)
Urobilinogen, UA: 0.2 mg/dL (ref 0.0–1.0)
pH: 5.5 (ref 5.0–8.0)

## 2010-07-08 LAB — BENZODIAZEPINE, QUANTITATIVE, URINE
Alprazolam (GC/LC/MS), ur confirm: NEGATIVE NG/ML
Flurazepam GC/MS Conf: NEGATIVE NG/ML
Lorazepam UR QT: NEGATIVE NG/ML
Nordiazepam GC/MS Conf: NEGATIVE NG/ML
Oxazepam GC/MS Conf: 386 NG/ML — ABNORMAL HIGH
Temazepam GC/MS Conf: NEGATIVE NG/ML

## 2010-07-08 LAB — URINE DRUGS OF ABUSE SCREEN W ALC, ROUTINE (REF LAB)
Amphetamine Screen, Ur: NEGATIVE
Barbiturate Quant, Ur: NEGATIVE
Benzodiazepines.: POSITIVE — AB
Cocaine Metabolites: NEGATIVE
Creatinine,U: 174.6 mg/dL
Ethyl Alcohol: <10 mg/dL (ref <10)
Marijuana Metabolite: NEGATIVE
Methadone: NEGATIVE
Opiate Screen, Urine: NEGATIVE
Phencyclidine (PCP): NEGATIVE
Propoxyphene: NEGATIVE

## 2010-07-08 LAB — PREGNANCY, URINE: Preg Test, Ur: NEGATIVE

## 2010-07-09 ENCOUNTER — Ambulatory Visit (HOSPITAL_COMMUNITY): Payer: 59 | Admitting: Psychology

## 2010-07-09 DIAGNOSIS — F331 Major depressive disorder, recurrent, moderate: Secondary | ICD-10-CM

## 2010-07-09 NOTE — Letter (Signed)
Summary: medical release  medical release   Imported By: Lind Guest 07/05/2010 11:43:48  _____________________________________________________________________  External Attachment:    Type:   Image     Comment:   External Document

## 2010-07-11 ENCOUNTER — Encounter (HOSPITAL_COMMUNITY): Payer: 59 | Admitting: Psychiatry

## 2010-07-11 DIAGNOSIS — F332 Major depressive disorder, recurrent severe without psychotic features: Secondary | ICD-10-CM

## 2010-07-11 DIAGNOSIS — F410 Panic disorder [episodic paroxysmal anxiety] without agoraphobia: Secondary | ICD-10-CM

## 2010-07-16 NOTE — Assessment & Plan Note (Signed)
Summary: follow up   Vital Signs:  Patient profile:   35 year old female Menstrual status:  regular Height:      66 inches Weight:      271 pounds BMI:     43.90 O2 Sat:      96 % Pulse rate:   84 / minute Pulse rhythm:   regular Resp:     16 per minute BP sitting:   100 / 78  Vitals Entered By: Everitt Amber LPN (June 30, 1608 8:30 AM)  Nutrition Counseling: Patient's BMI is greater than 25 and therefore counseled on weight management options. CC: Follow up chronic problems   CC:  Follow up chronic problems.  History of Present Illness: Reports  that hs has been hospitalised for suicidal ideation in January.she is slowly improving, but feels really challenged and overwhelmed mostof the time. She is in multiple support grps and therapy Denies recent fever or chills. Denies sinus pressure, nasal congestion , ear pain or sore throat. Denies chest congestion, or cough productive of sputum. Denies chest pain, palpitations, PND, orthopnea or leg swelling. Denies abdominal pain, nausea, vomitting, diarrhea Denies change in bowel movements or bloody stool. Denies dysuria , frequency, incontinence or hesitancy. Denies  joint pain, swelling, or reduced mobility. Denies headaches, vertigo, seizures.  Denies  rash, lesions, or itch.     Current Medications (verified): 1)  Wellbutrin Xl 150 Mg Xr24h-Tab (Bupropion Hcl) .... Take 1 Tablet By Mouth Once A Day 2)  Lamictal 150 Mg Tabs (Lamotrigine) .... Take 1 Tablet By Mouth Two Times A Day 3)  Trazodone Hcl 150 Mg Tabs (Trazodone Hcl) .... Take 1 Tab By Mouth At Bedtime 4)  Valium 10 Mg Tabs (Diazepam) .... Take 1 Tablet By Mouth Two Times A Day 5)  Risperdal 0.5 Mg Tabs (Risperidone) .... Take 1 Tablet By Mouth Four Times A Day  Allergies (verified): 1)  ! Pcn  Past History:  Past medical, surgical, family and social histories (including risk factors) reviewed, and no changes noted (except as noted below).  Past Medical  History: Current Problems:  PERSONAL HISTORY OTHER MALIGNANT NEOPLASM SKIN (ICD-V10.83)  DEPRESSION (ICD-311) OBESITY (ICD-278.00) Hospitalised January 3 for 2 weeks because of suicidal ideation, has been in intensive outpt gp therapy for 2 weeks, now on individual.  Past Surgical History: Reviewed history from 08/01/2008 and no changes required. Tonsillectomy (1999) Kidney surgery on the urethra - age 66 Removal of malignant mole from her left spine - infancy removal of abnormal skin lesion from post chest  Family History: Reviewed history from 08/01/2008 and no changes required. Mother aged 64, HTN, iterstitial cystitis Father aged 21: colon polyps and depression Siters :none  Brother x1 aged 57, depression  Social History: Reviewed history from 03/18/2010 and no changes required. unemployed,RN Married Children x 2 ages 73 and 3 Never Smoked Alcohol use-no Drug use-no  Review of Systems      See HPI General:  Complains of fatigue and sleep disorder. Eyes:  Complains of vision loss-both eyes. GI:  Complains of constipation. Psych:  Complains of anxiety, depression, and mental problems; denies suicidal thoughts/plans, thoughts of violence, unusual visions or sounds, and thoughts /plans of harming others. Endo:  Denies cold intolerance, excessive hunger, excessive thirst, excessive urination, and heat intolerance. Heme:  Denies abnormal bruising and bleeding. Allergy:  Denies hives or rash and itching eyes.  Physical Exam  General:  Well-developed,obese,in no acute distress; alert,appropriate and cooperative throughout examination HEENT: No facial asymmetry,  EOMI, No sinus tenderness, TM's Clear, oropharynx  pink and moist.   Chest: Clear to auscultation bilaterally.  CVS: S1, S2, No murmurs, No S3.   Abd: Soft, Nontender.  MS: Adequate ROM spine, hips, shoulders and knees.  Ext: No edema.   CNS: CN 2-12 intact, power tone and sensation normal throughout.   Skin:  Intact, no visible lesions or rashes.  Psych: Good eye contact,flat affect.  Memory intact,  depressed appearing.    Impression & Recommendations:  Problem # 1:  OTHER CHRONIC BRONCHITIS (ICD-491.8) Assessment Improved  Problem # 2:  ANXIETY DISORDER (ICD-300.00) Assessment: Improved  Her updated medication list for this problem includes:    Wellbutrin Xl 150 Mg Xr24h-tab (Bupropion hcl) .Marland Kitchen... Take 1 tablet by mouth once a day    Trazodone Hcl 150 Mg Tabs (Trazodone hcl) .Marland Kitchen... Take 1 tab by mouth at bedtime    Valium 10 Mg Tabs (Diazepam) .Marland Kitchen... Take 1 tablet by mouth two times a day  Problem # 3:  DEPRESSION (ICD-311) Assessment: Improved  Her updated medication list for this problem includes:    Wellbutrin Xl 150 Mg Xr24h-tab (Bupropion hcl) .Marland Kitchen... Take 1 tablet by mouth once a day    Trazodone Hcl 150 Mg Tabs (Trazodone hcl) .Marland Kitchen... Take 1 tab by mouth at bedtime    Valium 10 Mg Tabs (Diazepam) .Marland Kitchen... Take 1 tablet by mouth two times a day  Problem # 4:  OBESITY (ICD-278.00) Assessment: Deteriorated  Ht: 66 (07/01/2010)   Wt: 271 (07/01/2010)   BMI: 43.90 (07/01/2010) therapeutic lifestyle change discussed and encouraged  Complete Medication List: 1)  Wellbutrin Xl 150 Mg Xr24h-tab (Bupropion hcl) .... Take 1 tablet by mouth once a day 2)  Lamictal 150 Mg Tabs (Lamotrigine) .... Take 1 tablet by mouth two times a day 3)  Trazodone Hcl 150 Mg Tabs (Trazodone hcl) .... Take 1 tab by mouth at bedtime 4)  Valium 10 Mg Tabs (Diazepam) .... Take 1 tablet by mouth two times a day 5)  Risperdal 0.5 Mg Tabs (Risperidone) .... Take 1 tablet by mouth four times a day  Patient Instructions: 1)  Please schedule a follow-up appointment in 4 months. 2)  It is important that you exercise regularly at least 20 minutes 5 times a week. If you develop chest pain, have severe difficulty breathing, or feel very tired , stop exercising immediately and seek medical attention. 3)  Please continue to  regularly see your therapist and keep in close touch with your psychiatrist. 4)  Pls remember to schedule your pap. 5)  Pls set a mtg with the chaplain outside of the hospital explain that youare still afraid to go to the hsopital, I am sure she will understand. 6)  I know you will continue to improve, I will keep you in my thoughts.    Orders Added: 1)  Est. Patient Level IV [16109]

## 2010-07-18 ENCOUNTER — Encounter (HOSPITAL_COMMUNITY): Payer: 59 | Admitting: Psychiatry

## 2010-07-18 DIAGNOSIS — F332 Major depressive disorder, recurrent severe without psychotic features: Secondary | ICD-10-CM

## 2010-07-18 DIAGNOSIS — F410 Panic disorder [episodic paroxysmal anxiety] without agoraphobia: Secondary | ICD-10-CM

## 2010-07-19 ENCOUNTER — Encounter (HOSPITAL_COMMUNITY): Payer: 59 | Admitting: Psychiatry

## 2010-07-23 ENCOUNTER — Encounter (INDEPENDENT_AMBULATORY_CARE_PROVIDER_SITE_OTHER): Payer: 59 | Admitting: Psychiatry

## 2010-07-23 DIAGNOSIS — F331 Major depressive disorder, recurrent, moderate: Secondary | ICD-10-CM

## 2010-07-25 ENCOUNTER — Encounter (HOSPITAL_COMMUNITY): Payer: 59 | Admitting: Psychiatry

## 2010-07-25 DIAGNOSIS — F332 Major depressive disorder, recurrent severe without psychotic features: Secondary | ICD-10-CM

## 2010-07-25 DIAGNOSIS — F410 Panic disorder [episodic paroxysmal anxiety] without agoraphobia: Secondary | ICD-10-CM

## 2010-07-26 ENCOUNTER — Encounter (HOSPITAL_COMMUNITY): Payer: 59 | Admitting: Psychology

## 2010-07-26 DIAGNOSIS — F332 Major depressive disorder, recurrent severe without psychotic features: Secondary | ICD-10-CM

## 2010-07-30 ENCOUNTER — Encounter (HOSPITAL_COMMUNITY): Payer: 59 | Admitting: Psychiatry

## 2010-07-30 DIAGNOSIS — F332 Major depressive disorder, recurrent severe without psychotic features: Secondary | ICD-10-CM

## 2010-07-30 DIAGNOSIS — F410 Panic disorder [episodic paroxysmal anxiety] without agoraphobia: Secondary | ICD-10-CM

## 2010-08-06 ENCOUNTER — Encounter (HOSPITAL_COMMUNITY): Payer: 59 | Admitting: Psychiatry

## 2010-08-06 DIAGNOSIS — F332 Major depressive disorder, recurrent severe without psychotic features: Secondary | ICD-10-CM

## 2010-08-06 DIAGNOSIS — F410 Panic disorder [episodic paroxysmal anxiety] without agoraphobia: Secondary | ICD-10-CM

## 2010-08-08 ENCOUNTER — Encounter (INDEPENDENT_AMBULATORY_CARE_PROVIDER_SITE_OTHER): Payer: 59 | Admitting: Psychiatry

## 2010-08-08 DIAGNOSIS — F331 Major depressive disorder, recurrent, moderate: Secondary | ICD-10-CM

## 2010-08-09 ENCOUNTER — Encounter (HOSPITAL_COMMUNITY): Payer: 59 | Admitting: Psychology

## 2010-08-09 DIAGNOSIS — F332 Major depressive disorder, recurrent severe without psychotic features: Secondary | ICD-10-CM

## 2010-08-16 ENCOUNTER — Encounter (HOSPITAL_COMMUNITY): Payer: 59 | Admitting: Psychiatry

## 2010-08-16 DIAGNOSIS — F410 Panic disorder [episodic paroxysmal anxiety] without agoraphobia: Secondary | ICD-10-CM

## 2010-08-16 DIAGNOSIS — F332 Major depressive disorder, recurrent severe without psychotic features: Secondary | ICD-10-CM

## 2010-08-23 ENCOUNTER — Encounter (HOSPITAL_COMMUNITY): Payer: 59 | Admitting: Psychiatry

## 2010-08-23 DIAGNOSIS — F332 Major depressive disorder, recurrent severe without psychotic features: Secondary | ICD-10-CM

## 2010-08-23 DIAGNOSIS — F410 Panic disorder [episodic paroxysmal anxiety] without agoraphobia: Secondary | ICD-10-CM

## 2010-08-29 ENCOUNTER — Encounter (HOSPITAL_COMMUNITY): Payer: 59 | Admitting: Psychiatry

## 2010-08-29 DIAGNOSIS — F332 Major depressive disorder, recurrent severe without psychotic features: Secondary | ICD-10-CM

## 2010-08-29 DIAGNOSIS — F410 Panic disorder [episodic paroxysmal anxiety] without agoraphobia: Secondary | ICD-10-CM

## 2010-08-30 ENCOUNTER — Encounter (HOSPITAL_COMMUNITY): Payer: 59 | Admitting: Psychology

## 2010-08-30 DIAGNOSIS — F332 Major depressive disorder, recurrent severe without psychotic features: Secondary | ICD-10-CM

## 2010-09-03 ENCOUNTER — Encounter (INDEPENDENT_AMBULATORY_CARE_PROVIDER_SITE_OTHER): Payer: 59 | Admitting: Psychiatry

## 2010-09-03 DIAGNOSIS — F331 Major depressive disorder, recurrent, moderate: Secondary | ICD-10-CM

## 2010-09-05 ENCOUNTER — Encounter (HOSPITAL_COMMUNITY): Payer: 59 | Admitting: Psychiatry

## 2010-09-05 DIAGNOSIS — F332 Major depressive disorder, recurrent severe without psychotic features: Secondary | ICD-10-CM

## 2010-09-05 DIAGNOSIS — F410 Panic disorder [episodic paroxysmal anxiety] without agoraphobia: Secondary | ICD-10-CM

## 2010-09-10 NOTE — Assessment & Plan Note (Signed)
[facility]                       [place] CARDIOLOGY OFFICE NOTE   NAME:Katelyn [name], Katelyn [name]                     MRN:          [id number]  DATE:08/16/2008                            DOB:          [birth year]    CHIEF COMPLAINT:  Chest pain and abnormal EKG.   HISTORY OF PRESENT ILLNESS:  Katelyn [name] is a very pleasant 35-year-  old married white female, registered nurse on third floor here at [employer], who comes referred by Dr. Syliva Overman for chest discomfort  and an abnormal EKG.   She describes a sharp pain that hits her just under her left breast and  lateral wall that last only seconds.  It is not associated with cough,  shortness of breath, hemoptysis.  It does not sound pleuritic.   She also complains of a tightness over her left breast when she is tense  and anxious.  This has occurred at work.   She saw Dr. Lodema Hong who performed an EKG which I do not have a copy of  today.  The patient states that it read left atrial enlargement and some  other abnormality.  Her EKG today shows essentially sinus tachycardia at  104 beats per minute with poor R-wave progression in the anterior  precordium.  An EKG from Dr. Carlena Hurl office in September 2009 is  identical except for a slightly slower heart rate.   She states that the chest tightness sometimes is worse when she  overexerts herself.  She is trying to get into a walking program.   PAST MEDICAL HISTORY:  She has had 2 C-sections and a tonsillectomy.  She has borderline hypertension which she does not understand.  This is  apparently relatively new.  She has struggled with her weight for  several years.   She is allergic to PENICILLIN.   She is currently on:  1. Lexapro 20 mg per day.  2. BuSpar 7.5 b.i.d.  3. Claritin 10 mg per day.   She does not smoke, does not drink or use any illicit drugs.   SOCIAL HISTORY:  She is married.  She is a Engineer, civil (consulting) as pointed out in the  HPI.   She has 2 children.   Her family history is remarkable for mother who is alive but has  hypertension.  Her father and brother are without any cardiac issues.  There is no history of diabetes in her family.   REVIEW OF SYSTEMS:  She has a history of occasional headaches.  She had  been told in the past she had a murmur and she is actually taken  antibiotics for this in the past.  She has a history of irritable bowel  syndrome.  Her other review of systems are negative and all questioned  on the diagnostic evaluation form.   PHYSICAL EXAMINATION:  GENERAL:  She is a pleasant overweight, somewhat  anxious white female in no acute distress.  VITAL SIGNS:  Her blood pressure is 143/93 in the right arm, her pulse  is 112.  An EKG shows sinus tachycardia.  She is 5 feet 6 inches, and  weighs  276 pounds.  HEENT:  Alert and oriented x3.  Skin is pale and dry.  She is wearing  glasses.  Facial symmetry is normal.  Dentition is satisfactory.  NECK:  Supple.  Carotid upstrokes were equal bilaterally without bruits.  No JVD.  Thyroid is not enlarged.  Trachea is midline.  LUNGS:  Clear to auscultation and percussion.  HEART:  Nondisplaced PMI.  Normal S1 and S2.  No murmur, rub, or gallop.  ABDOMEN:  Soft.  Good bowel sounds.  Organomegaly cannot be assessed  secondary to her weight.  EXTREMITIES:  Revealed no edema.  Pulses were present bilaterally,  equally symmetrical.  There is no sign of DVT.  There is no venous  varicosities.  SKIN:  Unremarkable.   I have reviewed all the laboratory data from Dr. Anthony Sar office which  is unremarkable including a hemoglobin of 13.0, a normal TSH, normal  potassium, normal calcium, normal blood sugar, normal BUN and  creatinine.  Cholesterol 158, triglycerides of 48, HDL 50, VLDL of only  10, LDL of 98.  Normal B12 and folate, normal ferritin, and hemoglobin  A1c that was normal.   I do recover the EKG done from Dr. Anthony Sar office at present, which  I  will review.   ASSESSMENT:  1. Probable noncardiac chest pain.  2. Abnormal EKG, probably normal variant.  3. Hypertension.  4. Obesity.  5. Sedentary lifestyle.   PLAN:  1. Reviewed EKG from Dr. Anthony Sar office which has been requested.  2. A 2-D echocardiogram without any structural or underlying heart      disease.  3. Weight reduction.  4. Watch her blood pressure closely.  5. Increase activity to 3 hours of walking per week if possible.   If her echo is normal, reassurance has been given.  She has been told  that she is at high risk of developing diabetes and other weight-related  problems including worsening of her blood pressure.  However, she can  get motivated and drop her weight considerably.   If her echocardiogram is normal, we will see her back on a p.r.n. basis.     Thomas C. Daleen Squibb, MD, [facility]  Electronically Signed    TCW/MedQ  DD: 08/16/2008  DT: 08/17/2008  Job #: [id number]   cc:   Milus Mallick. Lodema Hong, M.D.

## 2010-09-12 ENCOUNTER — Encounter (HOSPITAL_COMMUNITY): Payer: 59 | Admitting: Psychiatry

## 2010-09-12 DIAGNOSIS — F332 Major depressive disorder, recurrent severe without psychotic features: Secondary | ICD-10-CM

## 2010-09-12 DIAGNOSIS — F410 Panic disorder [episodic paroxysmal anxiety] without agoraphobia: Secondary | ICD-10-CM

## 2010-09-13 NOTE — H&P (Signed)
Katelyn [name], Katelyn [name]                          ACCOUNT NO.:  [id number]   MEDICAL RECORD NO.:  [id number]                   PATIENT TYPE:  OBV   LOCATION:  A321                                 FACILITY:  [facility]   PHYSICIAN:  Katelyn [name], M.D.                  DATE OF BIRTH:  01-20-1976   DATE OF ADMISSION:  08/04/2002  DATE OF DISCHARGE:                                HISTORY & PHYSICAL   PRIMARY PHYSICIAN:  Dr. Lodema Hong.   CHIEF COMPLAINT:  I have been vomiting and I have diarrhea.   HISTORY OF PRESENT ILLNESS:  The patient is a 35 year old lady with no  significant medical illness in the past.  She was well until about two days  ago when she developed an insidious onset of vomiting and diarrhea.  Vomitus  was clear, sometimes contains previously eaten foods.  She has had several  episodes of that.  She also has nausea.  The diarrhea stools she describes  as watery but nonbloody.  She says she feels weak and passed only about 50  mL of urine yesterday.  She saw her primary doctor who recommended that she  come in for IV hydration.  She has had no more episodes of diarrhea and  vomiting since she came in.   REVIEW OF SYSTEMS:  GENERAL:  She denies any fever or chills.  RESPIRATORY:  Denies any cough or shortness of breath.  CARDIOVASCULAR:  No chest pains.  She admits to palpitations on and off.  CNS:  Admits to dizziness.  She has  a headache.  She denies any dysuria.   PAST MEDICAL HISTORY:  She denies any history of hypertension or diabetes.  She is being treated for infertility at the moment.   MEDICATIONS:  She is on multivitamin medication.  She was taking some pills  for fertility but she has not taken it in a while.   FAMILY HISTORY:  Noncontributory.   SOCIAL HISTORY:  She is married.  She has no children.  Does not smoke or  drink alcohol.   PHYSICAL EXAMINATION:  VITAL SIGNS:  Blood pressure 142/73, heart rate 89,  temperature 98.5.  GENERAL:  She is a young lady  lying comfortably in bed, not in any distress.  She had moderate obesity.  HEENT:  She is not pale; she is anicteric.  She has a slightly dry oral  mucosa.  She has a desquamated tongue.  NECK:  Supple.  No lymphadenopathy, no thyromegaly.  CHEST:  Clear to auscultation.  CARDIOVASCULAR:  Heart sounds 1 and 2 are normal.  Rhythm is regular.  No  murmurs were heard.  ABDOMEN:  She has slight bilateral lower quadrant tenderness.  Bowel sounds  are present.  NEUROLOGIC:  She is alert and oriented x3.  EXTREMITIES:  She has no edema.   LABORATORY DATA AND DIAGNOSTICS:  CBC, CHEM-7, and a UA are pending.  ASSESSMENT AND PLAN:  Gastroenteritis, probably viral in etiology.  The  patient will be admitted for observation.  She will be hydrated with normal  saline at 120 mL/hour.  We will monitor her volume status.  The patient will  be given Phenergan IV 12.5 mg q.6h. p.r.n. for nausea.  Will also give her  Tylenol 650 q.6h. p.r.n. for fever and pain.  Send stool for leukocytes and  ova and parasites.   The patient to be admitted under the hospitalist service.  Further workup  and management will depend on clinical course.                                               Katelyn [name], M.D.    DW/MEDQ  D:  08/04/2002  T:  08/04/2002  Job:  [id number]

## 2010-09-13 NOTE — Discharge Summary (Signed)
NAME:  Katelyn [name], Katelyn [name]                        ACCOUNT NO.:  [id number]   MEDICAL RECORD NO.:  [id number]                   PATIENT TYPE:  INP   LOCATION:  9104                                 FACILITY:  [facility]   PHYSICIAN:  Genia Del, M.D.             DATE OF BIRTH:  04-21-1976   DATE OF ADMISSION:  03/24/2003  DATE OF DISCHARGE:  03/28/2003                                 DISCHARGE SUMMARY   ADMISSION DIAGNOSES:  1. Thirty-six weeks and 5 days gestation with mild, pregnancy-induced     hypertension .  2. Nonreassuring fetal heart rate monitoring.   DISCHARGE DIAGNOSES:  1. Thirty-six weeks and 5 days gestation with mild, pregnancy-induced     hypertension .  2. Nonreassuring fetal heart rate monitoring.  3. Birth of a baby boy on March 25, 2003, at 15:25 by cesarean section.   INTERVENTION:  Urgent primary low transverse C-section.   HOSPITAL COURSE:  The patient was induced with Cervidil and then Pitocin and  progressed to 5+ cm, but the tracing of the fetal heart rate was showing  late decelerations combined with prolonged, steep, variable decelerations.  After attempting to correct the problem with O2 mask, decrease Pitocin,  change in position, and increase fluids without success, the decision was  taken to proceed with urgent C-section. The baby was born at 15:25. Apgars  were 9 and 9, pH was 7.32. An urgent primary low transverse C-section was  done. The hysterotomy was closed in two planes. Estimated blood loss was 500  mL, and no complication occurred. Her postop course was unremarkable. The  patient has remained hemodynamically stable and afebrile. Her postop  hemoglobin was 10.2. She was discharged home on postop day #3 on March 28, 2003, in stable condition.  She was given postop advice and  recommendations. Percocet and Motrin were prescribed p.r.n. Ferrous sulfate  was advised daily. She will follow up in four to six weeks at the [facility]   OB[facility].                                               Genia Del, M.D.    ML/MEDQ  D:  04/22/2003  T:  04/22/2003  Job:  [id number]

## 2010-09-13 NOTE — Procedures (Signed)
NAME:  Katelyn [name], Katelyn [name]              ACCOUNT NO.:  [id number]   MEDICAL RECORD NO.:  [id number]          PATIENT TYPE:  OUT   LOCATION:  SLEE                          FACILITY:  [facility]   PHYSICIAN:  Kofi A. Gerilyn Pilgrim, M.D. DATE OF BIRTH:  [birth year]   DATE OF PROCEDURE:  DATE OF DISCHARGE:  09/17/2008                             SLEEP DISORDER REPORT   NOCTURNAL POLYSOMNOGRAPHY REPORT   REFERRING PHYSICIAN:  Kofi A. Doonquah, MD   INDICATIONS:  A 35 year old woman who presents with snoring, fatigue,  and has been evaluated for obstructive sleep apnea syndrome.   MEDICATIONS:  Ambien, BuSpar.   Epworth sleepiness scale 8.  BMI 46.   ARCHITECTURAL SUMMARY:  The total recording time is 403 minutes.  Sleep  efficiency 72%.  Sleep latency 41 minutes.  REM latency 135 minutes.  Stage N1 18%, N2 67%, N3 13%, REM sleep 2.4%.   RESPIRATORY SUMMARY:  Baseline oxygen saturation 98.  Lowest saturation  being 88.  AHI is 5.   LIMB MOVEMENT SUMMARY:  The PLM index is 20.   ELECTROCARDIOGRAM SUMMARY:  Average heart rate of 75 with isolated PVCs  observed.   IMPRESSION:  1. Mild obstructive sleep apnea syndrome, not requiring positive      pressure.  2. Moderate periodic limb movement disorder of sleep.      Kofi A. Gerilyn Pilgrim, M.D.  Electronically Signed     KAD/MEDQ  D:  09/23/2008  T:  09/24/2008  Job:  [id number]

## 2010-09-13 NOTE — H&P (Signed)
NAME:  Katelyn [name], Katelyn [name]                        ACCOUNT NO.:  [id number]   MEDICAL RECORD NO.:  [id number]                   PATIENT TYPE:  INP   LOCATION:  9163                                 [facility]ACILITY:  [facility]   PHYSICIAN:  Genia Del, M.D.             DATE OF BIRTH:  [birth year]   DATE OF ADMISSION:  03/24/2003  DATE OF DISCHARGE:                                HISTORY & PHYSICAL   HISTORY AND PHYSICAL:  Mrs. Katelyn [name] is a 35 year old G2, P0, A1, expected  date of delivery April 17, 2003 at 36 weeks and 5 days gestation.   REASON FOR ADMISSION:  Mild PIH and deep variable decelerations.   HISTORY OF PRESENT ILLNESS:  Patient was seen at [facility] office on  March 24, 2003.  She was followed closely for increased blood pressures  recently and severe generalized swelling.  She denies persistent headache.  She denies visual symptoms and epigastric pain.  Fetal movement is positive.  No regular uterine contractions.  No vaginal bleeding.  No fluid leak.  At  the office her blood pressure was 155/98, protein is trace, patient was sent  to maternity admission [facility] and there the  monitoring showed two episodes of severe decelerations that were late after  a long uterine contraction lasting about 2-1/2 minutes.  Around those two  decelerations which reached 80 beats per minute the fetal heart rate  monitoring was reactive with good accelerations and no other decelerations.  Patient has milder uterine contractions lasting about 40-50 seconds which  were not associated with any decelerations.  Her blood pressures remained on  the higher side with diastolics in the upper 80s-90s.  The decision was made  to admit the patient for Cervidil induction.   PAST MEDICAL HISTORY:  Positive for IBS, recurrent cystitis, depression.   PAST SURGICAL HISTORY:  Tonsillectomy; melanoma removed from left leg at 6  months; also in 1997 a first trimester therapeutic  abortion, no  complication.   ALLERGIES:  PENICILLIN (rash).   MEDICATIONS:  Prenate vitamins.   SOCIAL HISTORY:  Married; nonsmoker.   FAMILY HISTORY:  Positive for congenital heart malformation on father of the  baby's side, mother with chronic hypertension, a strong family history of  depression.   HISTORY OF PRESENT PREGNANCY:  First trimester was normal; hemoglobin was  13.3, platelets 349; blood type Rh B positive, Rh antibody is negative, RPR  nonreactive, HBsAg negative, HIV nonreactive, rubella titer is immune.  In  the second trimester patient had a triple test which was within normal  limits; ultrasound review of anatomy at 20 weeks was limited by maternal  body habitus, placenta was anterior normal, cervical length 3.6 cm and  closed, dating corresponded, the ultrasound review of anatomy was completed  at [facility] and was within normal limits; 1-hour GTT at 27 weeks was  high at 153, 3-hour GTT was within normal limits; ultrasound interval  growth  at 29 plus weeks was at the 79th percentile, amniotic fluid normal.  In the  third trimester group B strep was negative at 35 plus weeks.  Her blood  pressures which were borderline on/off during her pregnancy were increased  for the past 2 weeks and she developed severe edema more so in the past  week.   REVIEW OF SYSTEMS:  CONSTITUTIONAL:  Negative.  HEENT:  Negative.  CARDIOVASCULAR/RESPIRATORY:  Negative.  UROLOGICAL/GI:  Negative.  DERMATOLOGICAL/ENDOCRINOLOGICAL AND NEUROLOGICAL:  Negative.   PHYSICAL EXAMINATION:  No apparent distress.  General edema at 2+.  Blood  pressure 156/93, pulse regular and normal, respiratory rate normal and  temperature normal.  Lungs clear bilaterally.  Regular cardiac rhythm.  Gravid uterus, cephalic presentation.  Vaginal exam closed, 2.5 cm long,  vertex, -3 to -2.  Lower limbs no clonus.  DTRs 2/4 bilaterally.  Fetal  heart rate sinus rate currently fetal heart rate baseline is  140s with  excellent accelerations and no decelerations.  Tracing is reactive.  Throughout maternity admission two decelerations were seen which occurred  late after a long uterine contraction each time lasting 2-1/2 minutes also,  the fetal heart tracing went down to 80s and then slowly recovered and it  became reactive again.  Currently uterine contractions are irregular and  mild associated with no pain.   LABORATORIES:  PIH labs today from the office revealed platelets at 307, AST  17, ALT 13, uric acid stable at 6.9, LDH normal at 154, and alkaline  phosphatase 143 slight increase.  Her 24-hour urine from March 22, 2003  revealed protein lower than 300 at 233 mg per 24 hours and creatinine at 181  g per 24 hours.   IMPRESSION:  Gravida 2, para 0, abortus 1 at 36 weeks and 5 days gestation  with deep variable decelerations on monitoring and mild pregnancy-induced  hypertension; no diagnosis of preeclampsia.   PLAN:  Admit to labor and delivery, Cervidil induction tonight, we will have  her on low dose Pitocin in the morning, continuous monitoring.                                               Genia Del, M.D.    ML/MEDQ  D:  03/24/2003  T:  03/24/2003  Job:  [id number]

## 2010-09-13 NOTE — Discharge Summary (Signed)
   [name], [name]                          ACCOUNT NO.:  [id number]   MEDICAL RECORD NO.:  [id number]                   PATIENT TYPE:  OBV   LOCATION:  A321                                 FACILITY:  [facility]   PHYSICIAN:  Sarita Bottom, M.D.                  DATE OF BIRTH:  [birth year]   DATE OF ADMISSION:  08/04/2002  DATE OF DISCHARGE:                                 DISCHARGE SUMMARY   DISCHARGE DIAGNOSIS:  Gastroenteritis.   DISCHARGE MEDICATIONS:  The patient to continue preoperative admission  medications.   HISTORY OF PRESENT ILLNESS:  [name] is a 35 year old lady who  was admitted earlier in the day when she presented with a complaint of a 2-  day history of diarrhea and vomiting.   PHYSICAL EXAMINATION:  GENERAL: Physical examination on admission revealed a  young lady not in any distress.  HEENT:  She had a fairly dry tongue.  ABDOMEN:  Abdomen had slight tenderness on deep palpation.   X-RAY AND LABORATORY DATA:  The admission laboratory investigations revealed  a sodium of 139, potassium of 3.4, BUN of 13, creatinine of 9, glucose of  78.  WBC 10.6 and hemoglobin is 14.8.   HOSPITAL COURSE:  The patient was admitted with rule out  impression of a  gastroenteritis probably viral in nature.  She was treated with IV fluids  and IV Phenergan for nausea and vomiting.  Her potassium was corrected on  oral K-Dur.  The patient's clinical course was significant for improvement  in her general condition.  She only had 1 episode of diarrhea and vomiting  while as an inpatient.   DISPOSITION:  The patient is going to be discharged home today.  She has  been advised to continue with oral hydration and to come to the emergency  room if the diarrhea and vomiting recurs.   CONDITION ON DISCHARGE:  Stable and satisfactory.                                               Sarita Bottom, M.D.    DW/MEDQ  D:  08/04/2002  T:  08/05/2002  Job:  [id number]

## 2010-09-13 NOTE — Discharge Summary (Signed)
NAMEKAILE, BIXLER              ACCOUNT NO.:  [id number]   MEDICAL RECORD NO.:  [id number]          PATIENT TYPE:  INP   LOCATION:  9105                          FACILITY:  [facility]   PHYSICIAN:  Genia Del, M.D.DATE OF BIRTH:  [birth year]   DATE OF ADMISSION:  11/14/2005  DATE OF DISCHARGE:  11/17/2005                                 DISCHARGE SUMMARY   DIAGNOSES:  Gestation of 38 weeks, previous cesarean section, preeclampsia,  breech presentation, plan for elective repeat cesarean section.   ATTENDING PHYSICIAN ON DISCHARGE:  Richardean Sale, M.D.   DISCHARGE DIAGNOSES:  Postoperative day #3, status post repeat cesarean  section, in stable condition.   PATIENT HISTORY:  Patient is a gravida 2, para 1 with an EDC of November 25, 2005.  Patient has received prenatal care at [facility]  Gynecology, and Infertility with primary attending being Dr. Seymour Bars since  approximately eight weeks gestation.   Patient has known allergy to PENICILLIN with reported rash with ingestion.   CURRENT MEDICATIONS:  Include prenatal vitamin once daily.   LAB WORK:  Patient is B+.  Antibody screen negative.  Hepatitis B surface  antigen is negative.  HIV is negative.  Rubella is immune.  RPR status is  nonreactive.  Patient is known to have a glucose intolerance of pregnancy,  managed with diet.   Surgeries include previous cesarean section for preeclampsia in 2004,  urethral surgery dilation at age 35, tonsillectomy at age 35   Medical conditions include glucose intolerance of pregnancy, occasional  urinary tract infection.   Preop labs on day of admission, white blood cell count of 13.4, hemoglobin  12.3, hematocrit 36.3, platelet count 364.  RPR status on admission is  nonreactive.   Patient is admitted on November 14, 2005 for repeat cesarean section per Dr.  Seymour Bars, assist Marlinda Mike, CNM.  See operative note.  Patient delivers a  female newborn, viable, at 12:03.  Apgar scars of  8 and 9.  Birth weight of  7.2.   Postoperative day #1, patient complained of pain.  PCA still intact, in  place.  Effective for pain relief.  Tolerating diet without nausea.  Up ad  lib to bathroom with small amount of bleeding.  Vital signs are stable.  Patient is afebrile.  Postoperative labs include white blood cell count of  11.4, hemoglobin 10.3, hematocrit 30.2, platelet count 253.  Lungs are  clear.  Abdomen, positive bowel sounds, mildly distended, but soft and  nontender.  Fundus is firm.  Dressing is intact with a small amount of  drainage that has been increasing since surgery that has been marked with no  further increase in the last eight hours.  Patient has a trace amount of  edema.  Routine postoperative care.   Postoperative day #2, patient is tolerating diet.  Bleeding is small.  Vital  signs are stable.  Patient is afebrile.  Abdomen is soft and nontender.  Dressing is off.  Incision is intact without drainage or redness.   Postoperative day #3, day of discharge.  Patient is up ad lib, tolerating  diet.  Voiding quantity sufficient.  Passing gas with no bowel movement.  A  small amount of bleeding.  Vital signs are stable with a temperature of  100.1 the past two nights around 4:00 a.m.  Patient is breast-feeding.  Breasts are nonengorged.  Abdomen is soft, nondistended.  Fundus is firm,  nontender.  Small amount of bleeding.  Incision is well approximated with  staples.  No drainage or redness.  Staples are removed.  Steri-Strips are  applied.  Patient is discharged home in stable condition with routine  discharge instructions per [facility] postop post partum booklet.  Patient is  on a regular diet at home.  Activity per postoperative restrictions.  Medications at discharge include a prenatal vitamin once daily, Niferex iron  daily x2 months, ibuprofen 800 mg every 8 hours as needed for discomfort,  Percocet 1-2 tablets every 4-6 hours as needed for pain.  Patient is  to call  specifically for temperature greater than 100.5, increasing in pain or  bleeding, any redness, drainage, or pain from incision site, any redness,  engorgement, streaks, fever or chills associated with breast-feeding and  early signs of mastitis.      Marlinda Mike, C.N.M.      Genia Del, M.D.  Electronically Signed    TB/MEDQ  D:  12/20/2005  T:  12/21/2005  Job:  [id number]

## 2010-09-13 NOTE — Op Note (Signed)
NAME:  KENNIDI, YOSHIDA              ACCOUNT NO.:  [id number]   MEDICAL RECORD NO.:  [id number]          PATIENT TYPE:  INP   LOCATION:  9105                          FACILITY:  [facility]   PHYSICIAN:  Genia Del, M.D.DATE OF BIRTH:  [birth year]   DATE OF PROCEDURE:  11/14/2005  DATE OF DISCHARGE:                                 OPERATIVE REPORT   PREOPERATIVE DIAGNOSES:  1.  A 38+ week gestation.  2.  Previous cesarean section.  3.  History of preeclampsia.   POSTOPERATIVE DIAGNOSES:  1.  A 38+ week gestation.  2.  Previous cesarean section.  3.  History of preeclampsia.  4.  Breech presentation.   PROCEDURE:  Repeat elective low transverse cesarean section.   SURGEON:  Genia Del, M.D.   ASSISTANT:  Marlinda Mike, C.N.M.   ANESTHESIOLOGIST:  Burnett Corrente, M.D.   DESCRIPTION OF PROCEDURE:  Under spinal anesthesia with 15 degree left  decubitus position, the patient is prepped with Betadine on the abdominal,  suprapubic, vulvar and vaginal area.  The bladder catheter is inserted and  the patient is draped as usual.  The level of anesthesia is verified and is  adequate.  We infiltrate the subcutaneous tissue with Marcaine 0.25% plain,  10 mL.  We make a Pfannenstiel incision at the site of the previous C.  section scar.  We open the adipose tissue with the scalpel.  We open the  aponeurosis  transversely with the electrocautery and complete on each side  with the Mayo scissors.  We separate the recti muscles from the aponeurosis  on the midline.  We then open the parietal peritoneum longitudinally with  Mayo scissors.  We then open the visceral peritoneum over the lower uterine  segment transversely with Metzenbaum scissors. The bladder is reclined  downward.  The bladder retractor is repositioned.  We make a low transverse  hysterotomy with the scalpel and extend it on each side with dressing  scissors.  The amniotic fluid is clear.  The fetus is in breech  presentation.  Birth of a baby girl.  The cord is clamped and cut.  The baby  is suctioned. We bring the baby to the neonatal team.  Apgars are 8 and 9.  Cor blood banking is done.  Cord blood is also taken for labs.  We give  Flagyl 500 mg IV after cord clamping and Pitocin IV is started.  The  placenta is evacuated spontaneously.  Uterine revision is done.  The  placenta will go to labor and delivery.  The uterus contracts very well.  We  closed the hysterotomy with a first locked running suture of 0 Vicryl.  An X  stitch is done on the midline to complete the closure and we do a second  plane with 0 Vicryl in a mattress stitch.  Hemostasis is adequate at all  levels on the hysterotomy.  We verify the ovaries and tubes and they are  normal to inspection.  We then irrigate and suction the abdominopelvic  cavity.  Hemostasis is adequate.  We reapproximate the recti muscles on the  midline  with separate stitches of 0 Vicryl.  We then close the aponeurosis  with two half running sutures of 0 Vicryl.  Hemostasis is completed on the  adipose tissue with the electrocautery.  We reapproximate the skin with  staples.  A dry dressing is applied.   ESTIMATED BLOOD LOSS:  400 mL.   COMPLICATIONS:  None.   Sponge and instrument counts correct x2.  The patient was brought to the  recovery room in good stable condition.      Genia Del, M.D.  Electronically Signed     ML/MEDQ  D:  11/14/2005  T:  11/14/2005  Job:  [id number]

## 2010-09-13 NOTE — Op Note (Signed)
NAME:  Katelyn [name], Katelyn [name]                        ACCOUNT NO.:  [id number]   MEDICAL RECORD NO.:  [id number]                   PATIENT TYPE:  INP   LOCATION:  9104                                 FACILITY:  [facility]   PHYSICIAN:  Genia Del, M.D.             DATE OF BIRTH:  05/29/1975   DATE OF PROCEDURE:  03/25/2003  DATE OF DISCHARGE:                                 OPERATIVE REPORT   PREOPERATIVE DIAGNOSES:  1. A 36 weeks and six days gestation.  2. Mild pregnancy-induced hypertension.  3. Nonreassuring fetal heart monitoring,with repetitive late decelerations.   POSTOPERATIVE DIAGNOSES:  1. A 36 weeks and six days gestation.  2. Mild pregnancy-induced hypertension.  3. Nonreassuring fetal heart monitoring, with repetitive late decelerations.   INTERVENTION:  Urgent primary low transverse cesarean section under  epidural.   SURGEON:  Genia Del, M.D.   ANESTHESIOLOGIST:  Belva Agee, M.D.   PROTOCOL:  Under epidural anesthesia the patient is in 15-degree left  decubitus position.  She is prepped with Hibiclens on the abdomen, also her  pubic and vulvar areas.  Draped as usual.  The bladder catheter is already  in place.   An infiltration of Marcaine 0.25% at 10 cc is done at the site of the  incision.  A Pfannenstiel incision is done with a scalpel.  The aponeurosis  is opened transversely with Mayo scissors.  The aponeurosis is separated  from the rectus muscles on the midline superiorly and inferiorly.  The  parietoperitoneum is opened bluntly.  We then opened the visceral peritoneum  over the lower uterine segment with the Metzenbaum scissors.  The bladder is  reclined downward.  A hysterotomy is done on the lower uterine segment  transversely with a scalpel, and elongated on each side with the scissors.   The fetus is in cephalic presentation.  Birth at 1525 hr of a baby boy.  Suctioned after delivery of the head.  The cord is clamped and cut.  The  baby  is given to the neonatal team.  Apgars are 9 and 9.  The pH is 7.32.  The cord blood is taken.   The placenta is evacuated spontaneously.  Uterine revision is done.  The  uterus contracts well.  Pitocin is started intravenously.  A dose of Flagyl  500 mg IV is given.  We exteriorized the uterus.  Both ovaries and tubes are  normal in size and appearance.  We closed the hysterotomy with first full  plain locked running suture of Vicryl.  A second plain in a mattress fashion  is done with 0 Vicryl.  Hemostasis is completed with an X-stitch of 0 Vicryl  at left angle.  We then irrigated and suctioned the abdominopelvic cavity.  The hemostasis is completed at the bladder flap and rectus muscles with  electrocautery.  We then closed the aponeurosis with two half-running  sutures of 0 Vicryl.  Hemostasis is  completed at the adipose tissue with the  electrocautery.  The count of instruments and gauzes is complete x2.  We  then reapproximated the skin with staples and a dry dressing is applied.   ESTIMATED BLOOD LOSS:  500 cc.   COMPLICATIONS:  None occurred.   DISPOSITION:  The patient was brought to the recovery room in good status.                                               Genia Del, M.D.    ML/MEDQ  D:  03/25/2003  T:  03/25/2003  Job:  [id number]

## 2010-09-19 ENCOUNTER — Encounter (HOSPITAL_COMMUNITY): Payer: 59 | Admitting: Psychiatry

## 2010-09-19 DIAGNOSIS — F410 Panic disorder [episodic paroxysmal anxiety] without agoraphobia: Secondary | ICD-10-CM

## 2010-09-19 DIAGNOSIS — F332 Major depressive disorder, recurrent severe without psychotic features: Secondary | ICD-10-CM

## 2010-09-20 ENCOUNTER — Encounter (HOSPITAL_COMMUNITY): Payer: 59 | Admitting: Psychology

## 2010-09-26 ENCOUNTER — Encounter (HOSPITAL_COMMUNITY): Payer: 59 | Admitting: Psychiatry

## 2010-09-26 DIAGNOSIS — F332 Major depressive disorder, recurrent severe without psychotic features: Secondary | ICD-10-CM

## 2010-09-26 DIAGNOSIS — F410 Panic disorder [episodic paroxysmal anxiety] without agoraphobia: Secondary | ICD-10-CM

## 2010-10-01 ENCOUNTER — Encounter (INDEPENDENT_AMBULATORY_CARE_PROVIDER_SITE_OTHER): Payer: 59 | Admitting: Psychiatry

## 2010-10-01 DIAGNOSIS — F331 Major depressive disorder, recurrent, moderate: Secondary | ICD-10-CM

## 2010-10-02 NOTE — Group Therapy Note (Signed)
[name], [name]              ACCOUNT NO.:  [id number]  MEDICAL RECORD NO.:  [id number]  LOCATION:  [facility]                           [facility]ILITY[facility]  [facility]  PHYSICIAN:  Arieh Bogue T. Oddie Bottger, M.D.   DATE OF BIRTH:  1975-12-24                                PROGRESS NOTE   HISTORY OF PRESENT ILLNESS: The patient came in today for her follow-up appointment.  She continued to endorse severe depression, hopeless and anhedonia.  She admitted that she has taken extra pills of Valium on Tuesday after the argument with her husband.  We have been talking to the patient in the past about ECT treatment.  The patient has already seen a therapist and had discussed more in detail about the plan about ECT and today, she has agreed to give a trial. So far, we have tried numerous psychotropic medications, but the patient continued to endorse depression, anxiety with severe episodes of hopelessness and helplessness.  In the past six months the patient has been admitted to the [facility] and three times she has done intensive outpatient program.  She continued to endorse social isolation, decreased energy, hopeless, helpless and feeling of worthless.  She continues to endorse passive suicidal thinking and no energy.  Her stressor remains the same, very tense and difficult relationship with her husband, though she had support from her parents, but she is not able to make her mind about her future relationship with the husband.  We had tried multiple psychotropic medication which we have to change and stop either due to lack of response or side effects. She had tried Lexapro, Abilify, Prozac, lithium, BuSpar, Cymbalta, Pristiq, Klonopin and Effexor.  CURRENT MEDICATIONS: Her current medications are: 1. Wellbutrin XL 300 mg in the morning. 2. Valium 5 mg q.i.d. which she has taken extra pills on Tuesday. 3. Lamictal 150 mg b.i.d. 4. Risperdal 0.5 mg q.i.d. 5. On her last visit we had cut down the  Wellbutrin to 150 as she is     complaining a lot of tremors and increased restlessness and     agitation with increased Wellbutrin to 300.  I also talked to the insurance company doctor, Dr. Wilhemena Durie at [phone], who needed the clinical date on this patient.  We provided the most recent update and even the possibility of the ECT treatment.  MENTAL STATUS EXAMINATION: The patient continued to express sad mood.  She maintained fair eye contact.  Her speech is very slow and soft.  Her thought process was also slow but logical.  Her affect was constricted.  She continued to endorse passive suicidal thinking but no current suicidal plan at this time.  There is some residual paranoia, but she denies any auditory hallucinations or homicidal ideation at this time. She is alert and oriented x3.  DIAGNOSIS: Major depressive disorder with psychotic features.  PLAN: As discussed earlier the patient has agreed to go to [facility] tomorrow for an ECT evaluation and treatment.  So far, we have tried numerous antipsychotic and antidepressant medications with fair to poor response.  I have strongly recommended to cut down the Valium to three per day and reminded safety plan. The patient promised  that she will not take extra Valium in the future.  We talked about potential side effects, tolerance, dependence, withdrawal and accidental overdose on this controlled substance.  The patient is also scheduled to see her OB/GYN due to amenorrhea for the past six months.  She was advised to do ultrasound which she will do after the ECT treatment.  We will continue her current medication which is Risperdal 0.5 mg q.i.d., Lamictal 150 mg b.i.d., Wellbutrin 150 in the morning and trazodone 150 at bedtime.  She currently reported no side effects of medication. We have advised to take Valium 5 mg only t.i.d., however, we will not give any early refill of the Valium.  I have explained the risks and  benefits of medication in detail.  She will continue to see the therapist in the [place] office and tomorrow, as planned, she will go to [facility] for ECT evaluation and treatment.     Damontay Alred T. Lolly Mustache, M.D.     STA/MEDQ  D:  10/01/2010  T:  10/01/2010  Job:  [id number]  Electronically Signed by Kathryne Sharper M.D. on 10/02/2010 04:24:30 PM

## 2010-10-03 ENCOUNTER — Encounter (HOSPITAL_COMMUNITY): Payer: 59 | Admitting: Psychiatry

## 2010-10-10 ENCOUNTER — Encounter (HOSPITAL_COMMUNITY): Payer: 59 | Admitting: Psychiatry

## 2010-10-11 ENCOUNTER — Telehealth: Payer: Self-pay | Admitting: Family Medicine

## 2010-10-11 NOTE — Telephone Encounter (Signed)
pls contact pt , let her know that I hope she is doing better since her hospitalization (psych). I have labwork, the sugar was high 155, may not have been fasting, but since unsure, I suggest she get HBA1c done to ensure not diabetic,, fasting sugar was entirely normal earlier this year

## 2010-10-11 NOTE — Telephone Encounter (Signed)
Called patient, left message.

## 2010-10-14 NOTE — Telephone Encounter (Signed)
Called patient, no answer 

## 2010-10-17 ENCOUNTER — Encounter (HOSPITAL_COMMUNITY): Payer: 59 | Admitting: Psychiatry

## 2010-10-24 ENCOUNTER — Encounter (HOSPITAL_COMMUNITY): Payer: 59 | Admitting: Psychiatry

## 2010-10-24 DIAGNOSIS — F332 Major depressive disorder, recurrent severe without psychotic features: Secondary | ICD-10-CM

## 2010-10-24 DIAGNOSIS — F410 Panic disorder [episodic paroxysmal anxiety] without agoraphobia: Secondary | ICD-10-CM

## 2010-11-06 ENCOUNTER — Encounter: Payer: Self-pay | Admitting: Family Medicine

## 2010-11-07 ENCOUNTER — Encounter (HOSPITAL_COMMUNITY): Payer: 59 | Admitting: Psychiatry

## 2010-11-07 DIAGNOSIS — F332 Major depressive disorder, recurrent severe without psychotic features: Secondary | ICD-10-CM

## 2010-11-07 DIAGNOSIS — F410 Panic disorder [episodic paroxysmal anxiety] without agoraphobia: Secondary | ICD-10-CM

## 2010-11-12 ENCOUNTER — Encounter (INDEPENDENT_AMBULATORY_CARE_PROVIDER_SITE_OTHER): Payer: 59 | Admitting: Psychiatry

## 2010-11-12 DIAGNOSIS — F331 Major depressive disorder, recurrent, moderate: Secondary | ICD-10-CM

## 2010-11-13 ENCOUNTER — Ambulatory Visit (INDEPENDENT_AMBULATORY_CARE_PROVIDER_SITE_OTHER): Payer: 59 | Admitting: Family Medicine

## 2010-11-13 ENCOUNTER — Encounter: Payer: Self-pay | Admitting: Family Medicine

## 2010-11-13 VITALS — BP 112/88 | HR 102 | Resp 16 | Ht 66.0 in | Wt 287.1 lb

## 2010-11-13 DIAGNOSIS — Z13820 Encounter for screening for osteoporosis: Secondary | ICD-10-CM

## 2010-11-13 DIAGNOSIS — R5381 Other malaise: Secondary | ICD-10-CM

## 2010-11-13 DIAGNOSIS — F411 Generalized anxiety disorder: Secondary | ICD-10-CM

## 2010-11-13 DIAGNOSIS — E282 Polycystic ovarian syndrome: Secondary | ICD-10-CM

## 2010-11-13 DIAGNOSIS — R5383 Other fatigue: Secondary | ICD-10-CM

## 2010-11-13 DIAGNOSIS — Z13220 Encounter for screening for lipoid disorders: Secondary | ICD-10-CM

## 2010-11-13 DIAGNOSIS — R7301 Impaired fasting glucose: Secondary | ICD-10-CM

## 2010-11-13 DIAGNOSIS — E669 Obesity, unspecified: Secondary | ICD-10-CM

## 2010-11-13 DIAGNOSIS — F3289 Other specified depressive episodes: Secondary | ICD-10-CM

## 2010-11-13 DIAGNOSIS — F329 Major depressive disorder, single episode, unspecified: Secondary | ICD-10-CM

## 2010-11-13 NOTE — Progress Notes (Signed)
  Subjective:    Patient ID: Katelyn [name], female    DOB: 06-12-1975, 35 y.o.   MRN: [id number]  HPI Pt hospitalized x 3 weeks for suicidal plan , discharged on June 26. Saw psychiatry 1 day ago. First suicide attempt was at age 38, she has had 3 others, and just in the past approx 3 years has really commited to treatment.and is established well with our local psychiatrist, and gets weekly therapy. Benefited most from shock therapy which she received with this last hospitalization  Generalized joint pains chronically, back, left knee and right hip and hands. Pt has been having irregular periods for this year she has gone over 4 months without one, however her spouse is sterilized. She has little energy to focus and concentrate at this time still, and sttates she will resume regular daily exercise as she did while in hospital. Working on weight loss remains a challenge, she does not cook much now, states he spouse is often not understanding of her illness but her Mom is a great support   Review of Systems Denies recent fever or chills. Denies sinus pressure, nasal congestion, ear pain or sore throat. Denies chest congestion, productive cough or wheezing. Denies chest pains, palpitations, paroxysmal nocturnal dyspnea, orthopnea and leg swelling Denies abdominal pain, nausea, vomiting,diarrhea or constipation.  Denies rectal bleeding or change in bowel movement. Denies dysuria, frequency, hesitancy or incontinence.  Still experiencing significant depression, anxiety and insomnia. Not suicidal or homicidal Denies skin break down or rash.        Objective:   Physical Exam Patient alert and oriented and in no Cardiopulmonary distress.  HEENT: No facial asymmetry, EOMI, no sinus tenderness, TM's clear, Oropharynx pink and moist.  Neck supple no adenopathy.  Chest: Clear to auscultation bilaterally.  CVS: S1, S2 no murmurs, no S3.  ABD: Soft non tender. Bowel sounds normal.  Ext:  No edema  MS: Adequate ROM spine, shoulders, hips and knees.  Skin: Intact, no ulcerations or rash noted.  Psych: Good eye contact, flat affect. Memory intact  CNS: CN 2-12 intact, power, tone and sensation normal throughout.        Assessment & Plan:

## 2010-11-13 NOTE — Patient Instructions (Addendum)
F/u November 25 or after.  Fasting labs NOV 22  or after   HBA1c today.  It is important that you exercise regularly at least 30 minutes 5 times a week. If you develop chest pain, have severe difficulty breathing, or feel very tired, stop exercising immediately and seek medical attention    A healthy diet is rich in fruit, vegetables and whole grains. Poultry fish, nuts and beans are a healthy choice for protein rather then red meat. A low sodium diet and drinking 64 ounces of water daily is generally recommended. Oils and sweet should be limited. Carbohydrates especially for those who are diabetic or overweight, should be limited to 34-45 gram per meal. It is important to eat on a regular schedule, at least 3 times daily. Snacks should be primarily fruits, vegetables or nuts.   I am happy that the treatment you recently got is working .   Pls plan your menu and follow this, it will help with weight control

## 2010-11-13 NOTE — Progress Notes (Signed)
Katelyn [name], Katelyn [name]              ACCOUNT NO.:  [id number]  MEDICAL RECORD NO.:  [id number]  LOCATION:  [facility]                           [facility]ACILITY[facility]  PHYSICIAN:  Rockelle Heuerman T. Akeisha Lagerquist, M.D.   DATE OF BIRTH:  Sep 11, 1975                                PROGRESS NOTE  Date; 11/12/10 The patient is a 35 year old Caucasian female who was recently discharged from [facility] after ECT treatment.  The patient is known to this office, and actually, she was recommended from this office to have an ECT evaluation and treatment.  The patient has finished 6 treatments of ECT.  Her medications were adjusted at [facility]. She is now taking nortriptyline up to 100 mg.  Her Valium has been reduced.  Her Wellbutrin and Lamictal have been discontinued.  She is still on trazodone 100 mg at bedtime.  The patient has been improved remarkably due to the ECT treatment.  She felt increased energy.  Her affect is improved.  She is more involved in daily activities.  However, she continued to have stress and anxiety and marital distress.  She continued to have racing thoughts and depression.  However, she is hoping that this new medication will resolve her residual symptoms.  She also had applied for disability which has been rejected.  However, she is in the process of appeal.  She was recommended at [facility] for outpatient ECT treatment.  However, her insurance refused for outpatient ECT treatment.  She was also suggested at [facility] to add stimulant to help her energy level.  However, the patient is reluctant to try stimulant given the history of increased anxiety and anxiousness with the Wellbutrin in the past.  She is sleeping better since she is taking all the Risperdal at bedtime.  She reported no side effects of medication.  She continued to have a difficult relationship with her husband.  Her mother has been supportive who is also very involved in taking care of her  kids.  PHYSICAL EXAMINATION: VITALS:  Her systolic blood pressure was 110.  Her pulse was 78.  She is 288 pounds today.  She has gained some weight in past 3 months.  MENTAL STATUS EXAM: The patient is mildly obese female who is casually dressed.  She maintained fair eye contact.  Her speech is soft, but clear and coherent.  Her thought process was logical, linear and goal-directed. Her mood is still anxious and depressed and affect is constricted, but it is better from her previous visit.  She denies any auditory hallucinations, suicidal thoughts or homicidal thoughts.  There were no psychosis present.  Her attention and concentration is improved.  Her thought processes were logical, linear, goal-directed.  She is alert and oriented x3.  Her insight, judgment, impulse control were okay.  DIAGNOSIS: AXIS I:  Major depressive disorder recurrent..  CURRENT MEDICATIONS: 1. Risperdal 2 mg at bedtime. 2. Nortriptyline 100 mg at bedtime. 3. Valium 5 mg as needed.  PLAN: I talked to the patient at length about her residual symptoms of depression.  I do believe that ECT treatment has remarkably improved her depression, and it is sad that she cannot continue maintenance ECT as an  outpatient.  I will increase her nortriptyline to 150 mg with the titration to 125 in 1 week and then 150 mg on second week to target the residual depression.  We will consider stimulant once we will reassess her in 2-3 weeks, if increased nortriptyline does not help.  She still has Valium and does not need a new refill as she has been cut down her Valium use.  She also has a refill on her Risperdal which she has been taking in the night time along with the trazodone.  I explained the risks and benefits of medication in detail.  We also talked in detail about the safety plan in case she has been feeling more depressed and having any suicidal thoughts and homicidal thoughts.  In that case, she will call 9-1-1 or  go to local ER or call crisis hot line.  I will see her again in 2-3 weeks.  She will continue to see therapist in [place] office.  Time spent 30 minutes.     [name] T. Lolly Mustache, M.D.     STA/MEDQ  D:  11/12/2010  T:  11/12/2010  Job:  [id number]  Electronically Signed by Kathryne Sharper M.D. on 11/13/2010 01:56:30 PM

## 2010-11-14 LAB — HEMOGLOBIN A1C
Hgb A1c MFr Bld: 5.5 % (ref <5.7)
Mean Plasma Glucose: 111 mg/dL (ref <117)

## 2010-11-15 LAB — VITAMIN D 1,25 DIHYDROXY
Vitamin D 1, 25 (OH)2 Total: 36 pg/mL (ref 18–72)
Vitamin D3 1, 25 (OH)2: 36 pg/mL

## 2010-11-18 ENCOUNTER — Encounter: Payer: Self-pay | Admitting: Family Medicine

## 2010-11-18 ENCOUNTER — Encounter (HOSPITAL_COMMUNITY): Payer: 59 | Admitting: Psychiatry

## 2010-11-18 DIAGNOSIS — F332 Major depressive disorder, recurrent severe without psychotic features: Secondary | ICD-10-CM

## 2010-11-18 DIAGNOSIS — F410 Panic disorder [episodic paroxysmal anxiety] without agoraphobia: Secondary | ICD-10-CM

## 2010-11-18 NOTE — Assessment & Plan Note (Signed)
Recent suicide attempt and prolonged hospitalization, hopefully ECT will be benficial

## 2010-11-18 NOTE — Assessment & Plan Note (Signed)
Prolonged amenorreah currently, no risk of pregnancy, pt reassured

## 2010-11-18 NOTE — Assessment & Plan Note (Signed)
Deteriorated. Patient re-educated about  the importance of commitment to a  minimum of 150 minutes of exercise per week. The importance of healthy food choices with portion control discussed. Encouraged to start a food diary, count calories and to consider  joining a support group. Sample diet sheets offered. Goals set by the patient for the next several months.    

## 2010-11-18 NOTE — Assessment & Plan Note (Signed)
Uncontrolled, will continue with aggressive therapy

## 2010-11-26 ENCOUNTER — Encounter (INDEPENDENT_AMBULATORY_CARE_PROVIDER_SITE_OTHER): Payer: 59 | Admitting: Psychiatry

## 2010-11-26 DIAGNOSIS — F331 Major depressive disorder, recurrent, moderate: Secondary | ICD-10-CM

## 2010-11-28 ENCOUNTER — Encounter (HOSPITAL_COMMUNITY): Payer: 59 | Admitting: Psychiatry

## 2010-11-28 DIAGNOSIS — F332 Major depressive disorder, recurrent severe without psychotic features: Secondary | ICD-10-CM

## 2010-11-28 DIAGNOSIS — F410 Panic disorder [episodic paroxysmal anxiety] without agoraphobia: Secondary | ICD-10-CM

## 2010-12-05 ENCOUNTER — Encounter (HOSPITAL_COMMUNITY): Payer: 59 | Admitting: Psychiatry

## 2010-12-05 DIAGNOSIS — F332 Major depressive disorder, recurrent severe without psychotic features: Secondary | ICD-10-CM

## 2010-12-05 DIAGNOSIS — F410 Panic disorder [episodic paroxysmal anxiety] without agoraphobia: Secondary | ICD-10-CM

## 2010-12-10 ENCOUNTER — Encounter (INDEPENDENT_AMBULATORY_CARE_PROVIDER_SITE_OTHER): Payer: 59 | Admitting: Psychiatry

## 2010-12-10 DIAGNOSIS — F331 Major depressive disorder, recurrent, moderate: Secondary | ICD-10-CM

## 2010-12-12 ENCOUNTER — Encounter (HOSPITAL_COMMUNITY): Payer: 59 | Admitting: Psychiatry

## 2010-12-12 DIAGNOSIS — F332 Major depressive disorder, recurrent severe without psychotic features: Secondary | ICD-10-CM

## 2010-12-12 DIAGNOSIS — F410 Panic disorder [episodic paroxysmal anxiety] without agoraphobia: Secondary | ICD-10-CM

## 2010-12-19 ENCOUNTER — Encounter (HOSPITAL_COMMUNITY): Payer: 59 | Admitting: Psychiatry

## 2010-12-19 DIAGNOSIS — F332 Major depressive disorder, recurrent severe without psychotic features: Secondary | ICD-10-CM

## 2010-12-19 DIAGNOSIS — F410 Panic disorder [episodic paroxysmal anxiety] without agoraphobia: Secondary | ICD-10-CM

## 2010-12-26 ENCOUNTER — Encounter (HOSPITAL_COMMUNITY): Payer: 59 | Admitting: Psychology

## 2010-12-26 ENCOUNTER — Encounter (HOSPITAL_COMMUNITY): Payer: 59 | Admitting: Psychiatry

## 2010-12-26 DIAGNOSIS — F332 Major depressive disorder, recurrent severe without psychotic features: Secondary | ICD-10-CM

## 2010-12-26 DIAGNOSIS — F410 Panic disorder [episodic paroxysmal anxiety] without agoraphobia: Secondary | ICD-10-CM

## 2010-12-27 ENCOUNTER — Other Ambulatory Visit: Payer: Self-pay | Admitting: Obstetrics & Gynecology

## 2010-12-27 DIAGNOSIS — N912 Amenorrhea, unspecified: Secondary | ICD-10-CM

## 2010-12-27 DIAGNOSIS — R7989 Other specified abnormal findings of blood chemistry: Secondary | ICD-10-CM

## 2011-01-02 ENCOUNTER — Encounter (HOSPITAL_COMMUNITY): Payer: 59 | Admitting: Psychiatry

## 2011-01-04 ENCOUNTER — Ambulatory Visit
Admission: RE | Admit: 2011-01-04 | Discharge: 2011-01-04 | Disposition: A | Discharge status: Home / Self Care | Payer: 59 | Source: Ambulatory Visit | Attending: Obstetrics & Gynecology | Admitting: Obstetrics & Gynecology

## 2011-01-04 DIAGNOSIS — R7989 Other specified abnormal findings of blood chemistry: Secondary | ICD-10-CM

## 2011-01-04 DIAGNOSIS — N912 Amenorrhea, unspecified: Secondary | ICD-10-CM

## 2011-01-04 MED ORDER — GADOBENATE DIMEGLUMINE 529 MG/ML IV SOLN
15.0000 mL | Freq: Once | INTRAVENOUS | Status: AC | PRN
  Administered 2011-01-04: 15 mL via INTRAVENOUS

## 2011-01-10 ENCOUNTER — Encounter (HOSPITAL_COMMUNITY): Payer: 59 | Admitting: Psychiatry

## 2011-01-10 DIAGNOSIS — F332 Major depressive disorder, recurrent severe without psychotic features: Secondary | ICD-10-CM

## 2011-01-10 DIAGNOSIS — F410 Panic disorder [episodic paroxysmal anxiety] without agoraphobia: Secondary | ICD-10-CM

## 2011-01-14 ENCOUNTER — Encounter (HOSPITAL_COMMUNITY): Payer: 59 | Admitting: Psychiatry

## 2011-01-16 ENCOUNTER — Encounter (HOSPITAL_COMMUNITY): Payer: 59 | Admitting: Psychiatry

## 2011-01-20 DIAGNOSIS — K5900 Constipation, unspecified: Secondary | ICD-10-CM | POA: Insufficient documentation

## 2011-01-24 ENCOUNTER — Encounter (HOSPITAL_COMMUNITY): Payer: 59 | Admitting: Psychiatry

## 2011-01-30 ENCOUNTER — Encounter (INDEPENDENT_AMBULATORY_CARE_PROVIDER_SITE_OTHER): Payer: 59 | Admitting: Psychiatry

## 2011-01-30 DIAGNOSIS — F331 Major depressive disorder, recurrent, moderate: Secondary | ICD-10-CM

## 2011-01-30 NOTE — Progress Notes (Signed)
Katelyn [name], FASO              ACCOUNT NO.:  [id number]  MEDICAL RECORD NO.:  [id number]  LOCATION:  [facility]                           FACILITY:  [facility]  PHYSICIAN:  Syed T. Arfeen, M.D.   DATE OF BIRTH:  03/24/1976                                PROGRESS NOTE  Date; 01/30/11 The patient is a 35 year old Caucasian female who came for her followup appointment.  The patient was recently admitted to the [facility] for ECT treatment.  She had 6 ECT treatments.  She significantly got better with the ECT treatment.  This was the second time that she required inpatient treatment for ECT at [facility].  At the hospital, her Ritalin was stopped along with Valium.  She reported that her Risperdal was also decreased due to the fact they found a high prolactin level. This finding was also discussed with the endocrinologist, but per the endocrinologist if she does not have any associated symptoms then hyperprolactinemia does not need to be treated.  The patient reported that she is taking now Neurontin 400 mg 3 times a day to help her anxiety, but she continues to have anxiety spells.  She is sleeping good and getting some support from her parents and from husband.  She reports no side effects of medication, but she continues to endorse nervousness and anxiety.  CURRENT MEDICATIONS: 1. Neurontin 400 mg 3 times a day. 2. Trazodone 150 mg at bedtime. 3. Nortriptyline 75 mg 1 a day, which was reduced at [facility]. 4. Risperdal.  She does not know the dose, but it was reduced at     [facility]. 5. Metformin 500 mg twice a day for polycystic ovary disease. 6. Calcium.  She does not remember the dose.  MENTAL STATUS EXAM: The patient is fairly groomed and well dressed.  She appears to be more alert and maintaining good eye contact.  Her speech is clear and coherent.  Her thought processes are logical, linear and goal-directed. She denies any active or passive suicidal thoughts or  homicidal thoughts.  Her attention and concentration are much improved.  There was no paranoia or psychosis present though she noticed some anxiousness, but there were no delusions or psychoses present.  Her insight, judgment, impulse control were okay.  DIAGNOSIS: AXIS I:  Major depressive disorder, recurrent. AXIS II:  Deferred. AXIS III:  Polycystic ovary disease.  Obesity.  Her weight was 295 pounds today. AXIS IV:  Mild to moderate.  PLAN: I recommended the patient to bring the old discharge papers from the [facility].  For now, we will continue the discharge medications from the [facility], which are nortriptyline 75 mg at bedtime, Neurontin 400 mg 3 times a day, trazodone 150 mg at bedtime though I have recommended to try Vistaril 25 mg 1 to 2 capsules as needed for anxiety.  The patient had tried Klonopin, Valium, and Xanax in the past with little response.  The patient will bring the discharge paper with the correct dosage of Risperdal. She is also scheduled to see [name] in a few weeks.  At this time, she is not experiencing any side effects of medication.  I will see her again in  2 weeks.     Syed T. Lolly Mustache, M.D.     STA/MEDQ  D:  01/30/2011  T:  01/30/2011  Job:  [id number]  Electronically Signed by Kathryne Sharper M.D. on 01/30/2011 03:06:18 PM

## 2011-02-06 ENCOUNTER — Encounter (HOSPITAL_COMMUNITY): Payer: 59 | Admitting: Psychiatry

## 2011-02-06 DIAGNOSIS — F410 Panic disorder [episodic paroxysmal anxiety] without agoraphobia: Secondary | ICD-10-CM

## 2011-02-06 DIAGNOSIS — F332 Major depressive disorder, recurrent severe without psychotic features: Secondary | ICD-10-CM

## 2011-02-13 ENCOUNTER — Encounter (HOSPITAL_COMMUNITY): Payer: 59 | Admitting: Psychiatry

## 2011-02-13 DIAGNOSIS — F410 Panic disorder [episodic paroxysmal anxiety] without agoraphobia: Secondary | ICD-10-CM

## 2011-02-13 DIAGNOSIS — F332 Major depressive disorder, recurrent severe without psychotic features: Secondary | ICD-10-CM

## 2011-02-18 ENCOUNTER — Encounter (INDEPENDENT_AMBULATORY_CARE_PROVIDER_SITE_OTHER): Payer: 59 | Admitting: Psychiatry

## 2011-02-18 ENCOUNTER — Encounter (HOSPITAL_COMMUNITY): Payer: 59 | Admitting: Psychiatry

## 2011-02-18 DIAGNOSIS — F331 Major depressive disorder, recurrent, moderate: Secondary | ICD-10-CM

## 2011-02-20 ENCOUNTER — Encounter (HOSPITAL_COMMUNITY): Payer: 59 | Admitting: Psychiatry

## 2011-02-25 ENCOUNTER — Encounter (HOSPITAL_COMMUNITY): Payer: 59 | Admitting: Psychiatry

## 2011-02-25 DIAGNOSIS — F332 Major depressive disorder, recurrent severe without psychotic features: Secondary | ICD-10-CM

## 2011-02-25 DIAGNOSIS — F410 Panic disorder [episodic paroxysmal anxiety] without agoraphobia: Secondary | ICD-10-CM

## 2011-03-07 ENCOUNTER — Ambulatory Visit (INDEPENDENT_AMBULATORY_CARE_PROVIDER_SITE_OTHER): Payer: 59 | Admitting: Psychiatry

## 2011-03-07 DIAGNOSIS — F411 Generalized anxiety disorder: Secondary | ICD-10-CM

## 2011-03-07 DIAGNOSIS — F339 Major depressive disorder, recurrent, unspecified: Secondary | ICD-10-CM

## 2011-03-07 DIAGNOSIS — F419 Anxiety disorder, unspecified: Secondary | ICD-10-CM

## 2011-03-07 NOTE — Progress Notes (Signed)
   THERAPIST PROGRESS NOTE  Session Time: 2:00-2:50 pm  Participation Level: Active  Behavioral Response: Well GroomedAlertEuthymic  Type of Therapy: Individual Therapy  Treatment Goals addressed: Diagnosis: Depression and Anxiety  Interventions: DBT, Motivational Interviewing, Strength-based and Supportive  Summary: Katelyn [name] is a 35 y.o. female who presents with depression and anxiety symptoms. Pt today appears with elevated mood and affect. This is the best writer has experienced Pts mood stability since Pt entered treatment. Pt reports focusing more on her physical health through diet and exercise, increased self care time, using the DBT skills she is learning in her DBT class, less all or nothing thinking, and trying to be less controlling of situations and be more patient.  Pt described being proud of herself for the changes she is making and states she is not as dependent on Clinical research associate for her wellness or as fearful of writer going out on her maternity leave.   Suicidal/Homicidal: Nowithout intent/plan  Therapist Response: Identified the changes Pt is making in her life to direct her wellness in addition to the positive affects from the ECT treatment. Pt appears more self-confident and in more control of her mood than in previous sessions and appears motivated to continued working towards making behavioral and perspective changes.   Plan: Pt is scheduled in three weeks to see Forde Radon while writer is on medical leave, but Pt indicates a desire to see how well she does without individual counseling and instead use the support of her DBT class.   Diagnosis: Axis I: Anxiety Disorder NOS and Major Depression, Rec    Axis II: Deferred     Carman Ching, LCSW 03/07/2011

## 2011-03-25 ENCOUNTER — Encounter (HOSPITAL_COMMUNITY): Payer: 59 | Admitting: Psychiatry

## 2011-03-25 ENCOUNTER — Encounter (HOSPITAL_COMMUNITY): Payer: Self-pay | Admitting: Psychiatry

## 2011-03-25 ENCOUNTER — Ambulatory Visit (INDEPENDENT_AMBULATORY_CARE_PROVIDER_SITE_OTHER): Payer: 59 | Admitting: Psychiatry

## 2011-03-25 VITALS — Wt 274.0 lb

## 2011-03-25 DIAGNOSIS — F331 Major depressive disorder, recurrent, moderate: Secondary | ICD-10-CM

## 2011-03-25 MED ORDER — NORTRIPTYLINE HCL 75 MG PO CAPS: 75.0000 mg | ORAL_CAPSULE | Freq: Every day | ORAL | Status: DC

## 2011-03-25 MED ORDER — TRAZODONE HCL 150 MG PO TABS: 150.0000 mg | ORAL_TABLET | Freq: Every day | ORAL | Status: DC

## 2011-03-25 NOTE — Progress Notes (Signed)
Patient came for her followup appointment. She is now taking nortriptyline 75 mg and trazodone 150 mg at bedtime. She has stopped Neurontin as she was gaining weight. She is very excited as she lost 20 pounds from her last visit. She is more involved in her daily life. She's watching her appetite, Diet been doing regularly exercise. She continues to be involved in DBT and feels much improvement her therapy. She also scheduled to see therapist in [place] for individual sessions. She does not want any more medications and liked to keep the current dose. She is handling her family situation better. She had a good Thanksgiving. She is taking metformin for her diabetes and is scheduled to see Dr. Lodema Hong next week. She did not bring her complete list of medication on this visit however she promised she will bring on next visit. Overall she has improved in the past and denies any agitation crying spells social isolation anhedonia or anger. She also reported no side effects of medication with nortriptyline and trazodone.  Mental status emanation Patient is casually dressed well-groomed and maintained good eye contact. Her speech is soft clear and coherent. She described her mood is good and her affect is bright. She denies any active or passive suicidal thinking homicidal thinking. There are no psychotic symptoms present. She's alert and oriented x3. Her thought process is also logical linear and goal-directed.. Her attention and concentration is improved. Her insight judgment impulse control is okay.  Assessment Maj. depressive disorder  Plan I will continue trazodone 150 mg at bedtime and nortriptyline 75 mg at bedtime. I have explained risks and benefits of medication in detail. She will see her primary care physician next week for physical checkup. She will also see her therapist in [place] and continue DBT sessions. I have recommended to call us if she has any question or concern about the medication. I  will see her again in 2 months

## 2011-03-26 ENCOUNTER — Encounter: Payer: Self-pay | Admitting: Family Medicine

## 2011-03-26 LAB — BASIC METABOLIC PANEL
CO2: 27 mEq/L (ref 19–32)
Calcium: 9.7 mg/dL (ref 8.4–10.5)
Creat: 0.91 mg/dL (ref 0.50–1.10)
Glucose, Bld: 79 mg/dL (ref 70–99)
Sodium: 141 mEq/L (ref 135–145)

## 2011-03-26 LAB — CBC WITH DIFFERENTIAL/PLATELET
Basophils Relative: 0 % (ref 0–1)
Eosinophils Absolute: 0.1 10*3/uL (ref 0.0–0.7)
Eosinophils Relative: 1 % (ref 0–5)
Lymphs Abs: 2.2 10*3/uL (ref 0.7–4.0)
MCH: 29.6 pg (ref 26.0–34.0)
MCHC: 32 g/dL (ref 30.0–36.0)
MCV: 92.7 fL (ref 78.0–100.0)
Neutrophils Relative %: 69 % (ref 43–77)
Platelets: 327 10*3/uL (ref 150–400)
RBC: 4.79 MIL/uL (ref 3.87–5.11)

## 2011-03-26 LAB — HEPATIC FUNCTION PANEL
ALT: 42 U/L — ABNORMAL HIGH (ref 0–35)
AST: 29 U/L (ref 0–37)
Alkaline Phosphatase: 94 U/L (ref 39–117)
Bilirubin, Direct: 0.1 mg/dL (ref 0.0–0.3)
Total Bilirubin: 0.3 mg/dL (ref 0.3–1.2)

## 2011-03-26 LAB — LIPID PANEL
Cholesterol: 171 mg/dL (ref 0–200)
HDL: 39 mg/dL — ABNORMAL LOW (ref >39)

## 2011-03-31 ENCOUNTER — Encounter: Payer: Self-pay | Admitting: Family Medicine

## 2011-03-31 ENCOUNTER — Ambulatory Visit (INDEPENDENT_AMBULATORY_CARE_PROVIDER_SITE_OTHER): Payer: 59 | Admitting: Family Medicine

## 2011-03-31 VITALS — BP 128/60 | HR 101 | Resp 18 | Ht 66.0 in | Wt 271.1 lb

## 2011-03-31 DIAGNOSIS — F411 Generalized anxiety disorder: Secondary | ICD-10-CM

## 2011-03-31 DIAGNOSIS — F419 Anxiety disorder, unspecified: Secondary | ICD-10-CM

## 2011-03-31 DIAGNOSIS — F3289 Other specified depressive episodes: Secondary | ICD-10-CM

## 2011-03-31 DIAGNOSIS — R7309 Other abnormal glucose: Secondary | ICD-10-CM

## 2011-03-31 DIAGNOSIS — R7303 Prediabetes: Secondary | ICD-10-CM

## 2011-03-31 DIAGNOSIS — E669 Obesity, unspecified: Secondary | ICD-10-CM

## 2011-03-31 DIAGNOSIS — Z23 Encounter for immunization: Secondary | ICD-10-CM

## 2011-03-31 DIAGNOSIS — R7301 Impaired fasting glucose: Secondary | ICD-10-CM

## 2011-03-31 DIAGNOSIS — T7411XA Adult physical abuse, confirmed, initial encounter: Secondary | ICD-10-CM

## 2011-03-31 DIAGNOSIS — IMO0002 Reserved for concepts with insufficient information to code with codable children: Secondary | ICD-10-CM

## 2011-03-31 DIAGNOSIS — F329 Major depressive disorder, single episode, unspecified: Secondary | ICD-10-CM

## 2011-03-31 NOTE — Patient Instructions (Signed)
F/u in 3 months.  Please continue daily exercise and carb counting, fasting blood sugar goal of less than 100 also.  I recommend you stay in a safe place short term until your home situation improves. Please discuss with you husband and your family.  Iam happy that mentally you are otherwise much improved
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
## 2011-04-02 ENCOUNTER — Ambulatory Visit (INDEPENDENT_AMBULATORY_CARE_PROVIDER_SITE_OTHER): Payer: 59 | Admitting: Psychology

## 2011-04-02 ENCOUNTER — Encounter (HOSPITAL_COMMUNITY): Payer: Self-pay | Admitting: Psychology

## 2011-04-02 DIAGNOSIS — F411 Generalized anxiety disorder: Secondary | ICD-10-CM

## 2011-04-02 DIAGNOSIS — F331 Major depressive disorder, recurrent, moderate: Secondary | ICD-10-CM
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
## 2011-04-06 DIAGNOSIS — IMO0002 Reserved for concepts with insufficient information to code with codable children: Secondary | ICD-10-CM | POA: Insufficient documentation

## 2011-04-06 NOTE — Assessment & Plan Note (Signed)
Severe and disabling in behavioral therapy, non responsive to meds
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
## 2011-04-06 NOTE — Progress Notes (Signed)
  Subjective:    Patient ID: Katelyn [name], female    DOB: 10/10/1975, 35 y.o.   MRN: [id number]  HPI    Review of Systems     Objective:   Physical Exam        Assessment & Plan:

## 2011-04-06 NOTE — Assessment & Plan Note (Signed)
Improved with recent eCT , pt needs to continue to follow closely with psychiatrist and therapist

## 2011-04-06 NOTE — Assessment & Plan Note (Signed)
unchnaged Patient re-educated about  the importance of commitment to a  minimum of 150 minutes of exercise per week. The importance of healthy food choices with portion control discussed. Encouraged to start a food diary, count calories and to consider  joining a support group. Sample diet sheets offered. Goals set by the patient for the next several months.    

## 2011-04-06 NOTE — Assessment & Plan Note (Signed)
Now on metformin per endocrine,and following a low carb diet

## 2011-04-09 ENCOUNTER — Telehealth (HOSPITAL_COMMUNITY): Payer: Self-pay | Admitting: Psychology

## 2011-04-09 ENCOUNTER — Ambulatory Visit (HOSPITAL_COMMUNITY): Payer: 59 | Admitting: Psychology

## 2011-04-09 NOTE — Telephone Encounter (Signed)
Pt returned call informing front office that she cancelled w/ the automated system when called for reminder call.  [facility] confirmed this was accurate.

## 2011-04-09 NOTE — Telephone Encounter (Signed)
Left message for pt informing that she had missed appointment and wanted to call to inquire how she was doing.

## 2011-04-29 HISTORY — PX: COLONOSCOPY: SHX174

## 2011-05-05 ENCOUNTER — Ambulatory Visit (INDEPENDENT_AMBULATORY_CARE_PROVIDER_SITE_OTHER): Payer: 59 | Admitting: Psychology

## 2011-05-05 DIAGNOSIS — F332 Major depressive disorder, recurrent severe without psychotic features: Secondary | ICD-10-CM
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
## 2011-05-08 ENCOUNTER — Ambulatory Visit (INDEPENDENT_AMBULATORY_CARE_PROVIDER_SITE_OTHER): Payer: 59 | Admitting: Psychiatry

## 2011-05-08 DIAGNOSIS — F329 Major depressive disorder, single episode, unspecified: Secondary | ICD-10-CM

## 2011-05-08 MED ORDER — NORTRIPTYLINE HCL 50 MG PO CAPS: 75.0000 mg | ORAL_CAPSULE | Freq: Every day | ORAL | Status: DC

## 2011-05-08 MED ORDER — NORTRIPTYLINE HCL 50 MG PO CAPS: 75.0000 mg | ORAL_CAPSULE | Freq: Two times a day (BID) | ORAL | Status: DC

## 2011-05-08 MED ORDER — BUSPIRONE HCL 10 MG PO TABS: 10.0000 mg | ORAL_TABLET | Freq: Two times a day (BID) | ORAL | Status: DC

## 2011-05-08 NOTE — Progress Notes (Signed)
Addended by: Kathryne Sharper T on: 05/08/2011 01:37 PM   Modules accepted: Orders
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
## 2011-05-21 NOTE — Progress Notes (Signed)
Addended by: YATES, LEANNE M on: 05/21/2011 10:03 AM   Modules accepted: Level of Service  

## 2011-05-26 ENCOUNTER — Ambulatory Visit (HOSPITAL_COMMUNITY): Payer: 59 | Admitting: Psychology

## 2011-06-03 ENCOUNTER — Ambulatory Visit (INDEPENDENT_AMBULATORY_CARE_PROVIDER_SITE_OTHER): Payer: 59 | Admitting: Psychiatry

## 2011-06-03 ENCOUNTER — Encounter (HOSPITAL_COMMUNITY): Payer: Self-pay | Admitting: Psychiatry

## 2011-06-03 DIAGNOSIS — F329 Major depressive disorder, single episode, unspecified: Secondary | ICD-10-CM

## 2011-06-03 MED ORDER — BUSPIRONE HCL 10 MG PO TABS: 10.0000 mg | ORAL_TABLET | Freq: Three times a day (TID) | ORAL | Status: DC
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
## 2011-06-12 ENCOUNTER — Ambulatory Visit (INDEPENDENT_AMBULATORY_CARE_PROVIDER_SITE_OTHER): Payer: 59 | Admitting: Psychiatry

## 2011-06-12 DIAGNOSIS — F411 Generalized anxiety disorder: Secondary | ICD-10-CM

## 2011-06-12 DIAGNOSIS — F419 Anxiety disorder, unspecified: Secondary | ICD-10-CM

## 2011-06-12 DIAGNOSIS — F332 Major depressive disorder, recurrent severe without psychotic features: Secondary | ICD-10-CM
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
## 2011-06-12 NOTE — Progress Notes (Deleted)
Patient ID: Katelyn [name], female   DOB: [birth year], 36 y.o.   MRN: [id number] .j

## 2011-06-21 ENCOUNTER — Encounter (HOSPITAL_COMMUNITY): Payer: Self-pay

## 2011-06-21 ENCOUNTER — Emergency Department (HOSPITAL_COMMUNITY)
Admission: EM | Admit: 2011-06-21 | Discharge: 2011-06-21 | Disposition: A | Discharge status: Home / Self Care | Payer: 59 | Source: Home / Self Care

## 2011-06-21 DIAGNOSIS — B349 Viral infection, unspecified: Secondary | ICD-10-CM

## 2011-06-21 DIAGNOSIS — B9789 Other viral agents as the cause of diseases classified elsewhere: Secondary | ICD-10-CM

## 2011-06-21 LAB — POCT RAPID STREP A: Streptococcus, Group A Screen (Direct): NEGATIVE

## 2011-06-21 MED ORDER — IBUPROFEN 800 MG PO TABS: 800.0000 mg | ORAL_TABLET | Freq: Three times a day (TID) | ORAL | Status: AC

## 2011-06-21 MED ORDER — IBUPROFEN 800 MG PO TABS: 800.0000 mg | ORAL_TABLET | Freq: Three times a day (TID) | ORAL | Status: DC

## 2011-06-21 MED ORDER — PROMETHAZINE HCL 25 MG PO TABS: 25.0000 mg | ORAL_TABLET | Freq: Four times a day (QID) | ORAL | Status: DC | PRN

## 2011-06-21 NOTE — ED Notes (Signed)
PT STATES HER PERIODS ARE IRREGULAR AND SHE HASN'T HAD A PERIOD IN 46 DAYS AND IF SHE GOES THREE MONTHS WITHOUT A PERIOD SHE THEN TAKES MEDICATION TO START HER PERIOD.

## 2011-06-21 NOTE — ED Provider Notes (Signed)
History     CSN: [id number]  Arrival date & time 06/21/11  7829   None     Chief Complaint  Patient presents with  . Fever    (Consider location/radiation/quality/duration/timing/severity/associated sxs/prior treatment) Patient is a 36 y.o. female presenting with pharyngitis. The history is provided by the patient. No language interpreter was used.  Sore Throat This is a new problem. The current episode started yesterday. The problem occurs constantly. The problem has been gradually worsening. Pertinent negatives include no chest pain and no shortness of breath. The symptoms are aggravated by swallowing and drinking. The symptoms are relieved by acetaminophen and NSAIDs. She has tried acetaminophen for the symptoms. The treatment provided no relief.  Pt reports she vomitted x 2 yesterday.  Pt reports she has had fever and a sore throat.  Pt reports recently at Hospital with family member that was sick.  Past Medical History  Diagnosis Date  . Obesity   . Malignant neoplasm of skin   . Depression     several suicide attempts, hospitaluzed in 2012 for this    Past Surgical History  Procedure Date  . Tonsillectomy   . Kidney surgery age 77     on  the urethra   . Removal of malignant mole from her left spine infancy   . Removal of abnormal skin lesion from post chest   . Cesarean section     Family History  Problem Relation Age of Onset  . Hypertension Mother     Iterstitial Cystist  . Depression Brother   . Colon polyps Father   . Depression Father     History  Substance Use Topics  . Smoking status: Former Smoker    Quit date: 08/26/1996  . Smokeless tobacco: Not on file  . Alcohol Use: No    OB History    Grav Para Term Preterm Abortions TAB SAB Ect Mult Living                  Review of Systems  HENT: Positive for sore throat.   Respiratory: Negative for shortness of breath.   Cardiovascular: Negative for chest pain.  Gastrointestinal: Positive for  nausea and vomiting.  All other systems reviewed and are negative.    Allergies  Lithium and Penicillins  Home Medications   Current Outpatient Rx  Name Route Sig Dispense Refill  . ACETAMINOPHEN 325 MG PO TABS Oral Take 650 mg by mouth every 6 (six) hours as needed.    . IBUPROFEN 200 MG PO TABS Oral Take 200 mg by mouth every 6 (six) hours as needed.    . BUSPIRONE HCL 10 MG PO TABS Oral Take 1 tablet (10 mg total) by mouth 3 (three) times daily. 90 tablet 0  . METFORMIN HCL 500 MG PO TABS Oral Take 500 mg by mouth 2 (two) times daily with a meal.      . NORTRIPTYLINE HCL 50 MG PO CAPS Oral Take 2 capsules (100 mg total) by mouth at bedtime. 60 capsule 0  . TRAZODONE HCL 150 MG PO TABS Oral Take 1 tablet (150 mg total) by mouth at bedtime. 30 tablet 1    BP 141/80  Pulse 124  Temp(Src) 100.5 F (38.1 C) (Tympanic)  Resp 18  LMP 04/28/2011  Physical Exam  Vitals reviewed. Constitutional: She is oriented to person, place, and time. She appears well-developed and well-nourished.  HENT:  Head: Normocephalic and atraumatic.  Right Ear: External ear normal.  Left Ear: External  ear normal.  Eyes: Conjunctivae and EOM are normal. Pupils are equal, round, and reactive to light.  Neck: Normal range of motion. Neck supple.  Cardiovascular: Normal rate.   Pulmonary/Chest: Effort normal.  Abdominal: Soft.  Neurological: She is alert and oriented to person, place, and time. She has normal reflexes.  Skin: Skin is warm.  Psychiatric: She has a normal mood and affect.    ED Course  Procedures (including critical care time)   Labs Reviewed  POCT RAPID STREP A ([facility]RG CARE ONLY)   No results found.   No diagnosis found.    MDM  Strep negative.   Pt given rx for phenergan and ibuprofen.  Pt advised to see her Md for recheck on Monday        Langston Masker, [place] 06/21/11 [address] [place], [place] 07/04/11 [id number]

## 2011-06-21 NOTE — Discharge Instructions (Signed)
Viral Infections A viral infection can be caused by different types of viruses.Most viral infections are not serious and resolve on their own. However, some infections may cause severe symptoms and may lead to further complications. SYMPTOMS Viruses can frequently cause:  Minor sore throat.   Aches and pains.   Headaches.   Runny nose.   Different types of rashes.   Watery eyes.   Tiredness.   Cough.   Loss of appetite.   Gastrointestinal infections, resulting in nausea, vomiting, and diarrhea.  These symptoms do not respond to antibiotics because the infection is not caused by bacteria. However, you might catch a bacterial infection following the viral infection. This is sometimes called a "superinfection." Symptoms of such a bacterial infection may include:  Worsening sore throat with pus and difficulty swallowing.   Swollen neck glands.   Chills and a high or persistent fever.   Severe headache.   Tenderness over the sinuses.   Persistent overall ill feeling (malaise), muscle aches, and tiredness (fatigue).   Persistent cough.   Yellow, green, or brown mucus production with coughing.  HOME CARE INSTRUCTIONS   Only take over-the-counter or prescription medicines for pain, discomfort, diarrhea, or fever as directed by your caregiver.   Drink enough water and fluids to keep your urine clear or pale yellow. Sports drinks can provide valuable electrolytes, sugars, and hydration.   Get plenty of rest and maintain proper nutrition. Soups and broths with crackers or rice are fine.  SEEK IMMEDIATE MEDICAL CARE IF:   You have severe headaches, shortness of breath, chest pain, neck pain, or an unusual rash.   You have uncontrolled vomiting, diarrhea, or you are unable to keep down fluids.   You or your child has an oral temperature above 102 F (38.9 C), not controlled by medicine.   Your baby is older than 3 months with a rectal temperature of 102 F (38.9 C) or  higher.   Your baby is 3 months old or younger with a rectal temperature of 100.4 F (38 C) or higher.  MAKE SURE YOU:   Understand these instructions.   Will watch your condition.   Will get help right away if you are not doing well or get worse.  Document Released: 01/22/2005 Document Revised: 12/25/2010 Document Reviewed: 08/19/2010 Exit[employer] Patient Information 2012 [employer].Antibiotic Nonuse  Your caregiver felt that the infection or problem was not one that would be helped with an antibiotic. Infections may be caused by viruses or bacteria. Only a caregiver can tell which one of these is the likely cause of an illness. A cold is the most common cause of infection in both adults and children. A cold is a virus. Antibiotic treatment will have no effect on a viral infection. Viruses can lead to many lost days of work caring for sick children and many missed days of school. Children may catch as many as 10 "colds" or "flus" per year during which they can be tearful, cranky, and uncomfortable. The goal of treating a virus is aimed at keeping the ill person comfortable. Antibiotics are medications used to help the body fight bacterial infections. There are relatively few types of bacteria that cause infections but there are hundreds of viruses. While both viruses and bacteria cause infection they are very different types of germs. A viral infection will typically go away by itself within 7 to 10 days. Bacterial infections may spread or get worse without antibiotic treatment. Examples of bacterial infections are:  Sore throats (  like strep throat or tonsillitis).   Infection in the lung (pneumonia).   Ear and skin infections.  Examples of viral infections are:  Colds or flus.   Most coughs and bronchitis.   Sore throats not caused by Strep.   Runny noses.  It is often best not to take an antibiotic when a viral infection is the cause of the problem. Antibiotics can kill off the  helpful bacteria that we have inside our body and allow harmful bacteria to start growing. Antibiotics can cause side effects such as allergies, nausea, and diarrhea without helping to improve the symptoms of the viral infection. Additionally, repeated uses of antibiotics can cause bacteria inside of our body to become resistant. That resistance can be passed onto harmful bacterial. The next time you have an infection it may be harder to treat if antibiotics are used when they are not needed. Not treating with antibiotics allows our own immune system to develop and take care of infections more efficiently. Also, antibiotics will work better for us when they are prescribed for bacterial infections. Treatments for a child that is ill may include:  Give extra fluids throughout the day to stay hydrated.   Get plenty of rest.   Only give your child over-the-counter or prescription medicines for pain, discomfort, or fever as directed by your caregiver.   The use of a cool mist humidifier may help stuffy noses.   Cold medications if suggested by your caregiver.  Your caregiver may decide to start you on an antibiotic if:  The problem you were seen for today continues for a longer length of time than expected.   You develop a secondary bacterial infection.  SEEK MEDICAL CARE IF:  Fever lasts longer than 5 days.   Symptoms continue to get worse after 5 to 7 days or become severe.   Difficulty in breathing develops.   Signs of dehydration develop (poor drinking, rare urinating, dark colored urine).   Changes in behavior or worsening tiredness (listlessness or lethargy).  Document Released: 06/23/2001 Document Revised: 12/25/2010 Document Reviewed: 12/20/2008 Exit[employer] Patient Information 2012 [employer]. 

## 2011-06-21 NOTE — ED Notes (Signed)
PT HAS FEVER, BODY ACHES, SORETHROAT AND HAS VOMITED X2, SYMPTOMS STARTED YESTERDAY.

## 2011-06-26 ENCOUNTER — Ambulatory Visit (INDEPENDENT_AMBULATORY_CARE_PROVIDER_SITE_OTHER): Payer: 59 | Admitting: Psychiatry

## 2011-06-26 DIAGNOSIS — F339 Major depressive disorder, recurrent, unspecified: Secondary | ICD-10-CM

## 2011-06-26 DIAGNOSIS — F419 Anxiety disorder, unspecified: Secondary | ICD-10-CM

## 2011-06-26 DIAGNOSIS — F411 Generalized anxiety disorder: Secondary | ICD-10-CM

## 2011-06-26 NOTE — Progress Notes (Signed)
   THERAPIST PROGRESS NOTE  Session Time: 2:00-2:50 pm  Participation Level: Active  Behavioral Response: Fairly GroomedAlertAnxious and Depressed  Type of Therapy: Individual Therapy  Treatment Goals addressed: Diagnosis: Depression and Anxiety  Interventions: Other: Reveiw and Update Treatment Plan Goals  Summary: Katelyn [name] is a 36 y.o. female who presents with Depression and anxiety. Patient reports continued depression and anxiety maintaining at the same level. Used entire session to update treatment plan goals. (See plan).  # 1) ANXIETY o Be able to do basic daily activities with decreased anxiety. o Be able to handle traveling on vacation (June) o Be able to be more involved in church o Introduce CBT skills to challenge anxiety provoking thoughts and behaviors.  o Use session for brainstorming coping mechanisms to manage anxiety.  o Continue identifying triggers for anxiety.  o  09/17/11   #2) FAMILY          ENGAGMENT o Increased Quality time and pleasurable activities with family (kids and husband) o Manage homework time o Have more respect from my children o Learn how to keep myself healthy when my husband is mean.  o Learn ways to schedule in more fun family activities. o Find good bedtime rituals for putting the kids to bed for quality fun time. o Discuss rituals and routines for completing homework to make it less stressful. o Continue self-care activities (exercise 5 days a week, morning coffee routine, nightly tea) o Learn more ways to diffuse anger.   09/17/11   #3) DEPRESSION o Be less withdrawn at home and be around family more. o Have improved mood  o Learn new self-care activities to manage mood.  o Time with other people o Learn and maintain daily maintenance activities (open blinds, get dressed, shower, brush teeth etc.)  09/17/11    Suicidal/Homicidal: Nowithout intent/plan  Therapist Response: Updated client Treatment Plan Goals  Plan: Return  again in 2 weeks.  Diagnosis: Axis I: Major Depression, Recurrent severe    Axis II: none    [name] E, LCSW 06/26/2011

## 2011-07-01 ENCOUNTER — Encounter: Payer: Self-pay | Admitting: Family Medicine

## 2011-07-01 ENCOUNTER — Ambulatory Visit (INDEPENDENT_AMBULATORY_CARE_PROVIDER_SITE_OTHER): Payer: 59 | Admitting: Family Medicine

## 2011-07-01 VITALS — BP 112/78 | HR 78 | Resp 16 | Ht 66.0 in | Wt 244.4 lb

## 2011-07-01 DIAGNOSIS — E669 Obesity, unspecified: Secondary | ICD-10-CM

## 2011-07-01 DIAGNOSIS — R7301 Impaired fasting glucose: Secondary | ICD-10-CM

## 2011-07-01 DIAGNOSIS — G4700 Insomnia, unspecified: Secondary | ICD-10-CM

## 2011-07-01 DIAGNOSIS — E282 Polycystic ovarian syndrome: Secondary | ICD-10-CM

## 2011-07-01 DIAGNOSIS — F411 Generalized anxiety disorder: Secondary | ICD-10-CM

## 2011-07-01 NOTE — Progress Notes (Signed)
  Subjective:    Patient ID: Katelyn [name], female    DOB: November 22, 1975, 36 y.o.   MRN: [id number]  HPI The PT is here for follow up and re-evaluation of chronic medical conditions, medication management and review of any available recent lab and radiology data.  Preventive health is updated, specifically  Cancer screening and Immunization.   Questions or concerns regarding consultations or procedures which the PT has had in the interim are  addressed. The PT denies any adverse reactions to current medications since the last visit.  She has stopped buspar, states nothing helps her anxiety. She is not taking trazodone and has poor sleep. She is highly focussed on diet control, and has started 30 minutes daily of exercise, with excellent results. H/o bulemia, so she is cautioned about this. Seeing her old therapist every 2 weeks, and has started back in [place]. C/o longstanding dizziness with change in position on an inconsistent basis. Recently in ED with fevr, apparently due to viral illness, now better    Review of Systems See HPI Denies recent fever or chills. Denies sinus pressure, nasal congestion, ear pain or sore throat. Denies chest congestion, productive cough or wheezing. Denies chest pains, palpitations and leg swelling Denies abdominal pain, nausea, vomiting,diarrhea or constipation.   Denies dysuria, frequency, hesitancy or incontinence. Denies joint pain, swelling and limitation in mobility. Denies headaches, seizures, numbness, or tingling. Denies skin break down or rash.        Objective:   Physical Exam Patient alert and oriented and in no cardiopulmonary distress.  HEENT: No facial asymmetry, EOMI, no sinus tenderness,  oropharynx pink and moist.  Neck supple no adenopathy.  Chest: Clear to auscultation bilaterally.  CVS: S1, S2 no murmurs, no S3.No orthostatic change in Bp  ABD: Soft non tender. Bowel sounds normal.  Ext: No edema  MS: Adequate ROM  spine, shoulders, hips and knees.  Skin: Intact, no ulcerations or rash noted.  Psych: Good eye contact, flat affect. Memory intact mildly anxious and depressed appearing.  CNS: CN 2-12 intact, power, tone and sensation normal throughout.        Assessment & Plan:

## 2011-07-01 NOTE — Assessment & Plan Note (Signed)
Improved. Pt applauded on succesful weight loss through lifestyle change, and encouraged to continue same. Weight loss goal set for the next several months.  

## 2011-07-01 NOTE — Assessment & Plan Note (Signed)
HBA1C normal in December

## 2011-07-01 NOTE — Assessment & Plan Note (Signed)
Improved but still uncontrolled, will f/u with psych

## 2011-07-01 NOTE — Patient Instructions (Addendum)
F/u early August.  Please call if you need to see me before   You are doing well, keep up the good changes you are making.  Take your trazodone every night, sleep is essential for good health.  Dance is a great hobby and a great way to increase your exercise to 1 hour every day.  Ensure you eat sufficient calories every day, and weight loss of 1.5 to 2 pounds per week is healthy.  Spiritual growth and health is a good thing , so I encourage you to continue that journey.  Your blood pressure and heart rate are normal 120/80, and there is no change when you stand.    Try and establish a routine that calms you before you go to sleep, you will be provided with information on sleep hygiene also

## 2011-07-01 NOTE — Assessment & Plan Note (Signed)
Recently started regular cycles on metformin and with weight loss

## 2011-07-03 ENCOUNTER — Ambulatory Visit (INDEPENDENT_AMBULATORY_CARE_PROVIDER_SITE_OTHER): Payer: 59 | Admitting: Psychiatry

## 2011-07-03 ENCOUNTER — Encounter (HOSPITAL_COMMUNITY): Payer: Self-pay | Admitting: Psychiatry

## 2011-07-03 VITALS — Wt 246.0 lb

## 2011-07-03 DIAGNOSIS — F329 Major depressive disorder, single episode, unspecified: Secondary | ICD-10-CM

## 2011-07-03 MED ORDER — TRAZODONE HCL 150 MG PO TABS: 150.0000 mg | ORAL_TABLET | Freq: Every day | ORAL | Status: DC

## 2011-07-03 MED ORDER — NORTRIPTYLINE HCL 50 MG PO CAPS: 100.0000 mg | ORAL_CAPSULE | Freq: Every day | ORAL | Status: DC

## 2011-07-03 NOTE — Progress Notes (Signed)
Chief complaint I have difficulty sleeping  History of presenting illness Patient is 36 year old Caucasian married unemployed female who came for her followup appointment. Patient endorse anxiety and insomnia for past few weeks. She does not like BuSpar and stop taking it. She is living with her husband however relationship remains very tense at times. She admitted that at this time she cannot leave her husband due to financial reasons. Patient is trying to keep her relationship with her husband stable. However she continues to have agitation and anger with him. She was to go back on trazodone which was helping her sleep and anxiety. She has started seeing therapist every 3 weeks which is helping her. She denies any active or passive suicidal thinking but endorse social isolation and crying spells. Patient also seen her primary care physician one week ago recommended to restart trazodone.  Patient had a good support from her mother.  In the past we have tried multiple medication along with ECT and outpatient intensive program.   Current psychiatric medication Nortriptyline 100 mg at bedtime  Medical history Patient has history of polycystic ovary disease, obesity and chronic pain.  Alcohol and substance use history Patient denies any history of alcohol or substance use  Mental status examination Patient is casually dressed and fairly groomed. She present with a flat affect. She is cooperative and relevant in conversation. She described her mood is sad and depressed. She has poverty of speech and thought content but she denies any active or passive suicidal thoughts or homicidal thoughts. There were no psychotic symptoms present. Her attention and concentration is fair. Her thought process also slow. There were no extrapyramidal side effects or shakes present at this time. She's alert oriented x3. Her insight judgment and pulse control is okay.  Assessment Axis I Maj. depressive disorder Axis II  deferred Axis III see medical history Axis IV moderate  Plan I will start trazodone 150 mg at bedtime which was helping her sleep and anxiety. I will discontinue BuSpar which did not help her. She will continue nortriptyline 100 mg at bedtime. At this time patient reported no side effects of medication. I reinforced safety plan that in case if she felt that she is in danger from her husband or from some one else then she need to call 911 or go to local ER. I will see her again in 4 weeks  Time spent 30 minutes

## 2011-07-05 NOTE — ED Provider Notes (Signed)
Medical screening examination/treatment/procedure(s) were performed by non-physician practitioner and as supervising physician I was immediately available for consultation/collaboration.  Leslee Home, M.D.   Reuben Likes, MD 07/05/11 [phone]

## 2011-07-10 ENCOUNTER — Ambulatory Visit (INDEPENDENT_AMBULATORY_CARE_PROVIDER_SITE_OTHER): Payer: 59 | Admitting: Psychiatry

## 2011-07-18 ENCOUNTER — Ambulatory Visit (INDEPENDENT_AMBULATORY_CARE_PROVIDER_SITE_OTHER): Payer: 59 | Admitting: Psychiatry

## 2011-07-18 DIAGNOSIS — F411 Generalized anxiety disorder: Secondary | ICD-10-CM

## 2011-07-18 DIAGNOSIS — F419 Anxiety disorder, unspecified: Secondary | ICD-10-CM

## 2011-07-18 NOTE — Progress Notes (Signed)
   THERAPIST PROGRESS NOTE  Session Time: 2:00-2:50 pm  Participation Level: Active  Behavioral Response: Casual and NeatAlertAnxious  Type of Therapy: Individual Therapy  Treatment Goals addressed: Diagnosis: Depression and Anxiety  Interventions: CBT, Strength-based and Supportive  Summary: Katelyn [name] is a 36 y.o. female who presents with high anxiety and racing thoughts. Patient states she schedule this session as an emergency session due to high anxiety and inability to find any relief from her symptoms. She presented a list she created this week of "stressors" that she feels overwhelmed by and the list was two full pages long single spaced. She went through each one and described feeling "better" afterward. She states she does not have anyone she feels she can open up to honestly about her anxiety due to not wanting to disappoint or burden them. She states her family thinks she is doing well because her depression has lifted so she appears to be functioning better in that she is able to get out of the bed and function daily and they are unaware of the high level of anxiety. Patient identified some things she can do externally to minimize symptoms such as taking a hot bath, walking, talking and doing mindfulness. Patient indented in the session that she is overly inward focused and needs to do outwardly focused anxiety reduction techniques that get her away from the racing thoughts.   Suicidal/Homicidal: Nowithout intent/plan  Therapist Response: Used reflective and supportive listening to allow patient to release some of her anxiety through sharing her stressors. Explored barriers to having supportive people in her life to talk with. Brainstormed skills that are affective for patient in treating anxiety symptoms.   Plan: Return again in 1 week. Continue exploring anxiety management techniques and look further into a daily maintenance plan prevent anxiety.   Diagnosis: Axis I:  Anxiety Disorder NOS and Major Depression, Recurrent severe    Axis II: None    Salem Mastrogiovanni E, LCSW 07/18/2011

## 2011-07-24 ENCOUNTER — Encounter (HOSPITAL_COMMUNITY): Payer: Self-pay | Admitting: Psychiatry

## 2011-07-24 ENCOUNTER — Ambulatory Visit (INDEPENDENT_AMBULATORY_CARE_PROVIDER_SITE_OTHER): Payer: 59 | Admitting: Psychiatry

## 2011-07-24 DIAGNOSIS — F419 Anxiety disorder, unspecified: Secondary | ICD-10-CM

## 2011-07-24 DIAGNOSIS — F411 Generalized anxiety disorder: Secondary | ICD-10-CM

## 2011-07-24 NOTE — Progress Notes (Signed)
   THERAPIST PROGRESS NOTE  Session Time: 2:00-2:50 pm  Participation Level: Active  Behavioral Response: CasualAlertAnxious  Type of Therapy: Individual Therapy  Treatment Goals addressed: Anxiety  Interventions: Solution Focused, Biofeedback and Supportive  Summary: Katelyn [name] is a 36 y.o. female who presents with high anxiety. She reports increased racing thoughts and inability to stop them and find a sense of "relief" and of "calm". She had a notebook she has been filling for the past week with random worries and thoughts and appeared hopeless that anything would work to manage her anxiety symptoms. She expressed worry about how to talk with her psychiatrist, Dr. Lolly Mustache, about requesting to resume Vallum 10 mg daily for anxiety management since she found some relief by taking one pill last week. She required redirection to stay focused on one thought and wanted to drift from topic to topic. Patient agreed to five minutes of biofeedback to try to get away from her thoughts and find some calm. She was able to find minimal relief after trying the "Nuetral Technque" and agreed to download a breathing pacer on her I-Pad and practice one minute of breathing throughout the day for anxiety management.   Suicidal/Homicidal: Nowithout intent/plan  Therapist Response: Educated patient on her racing thoughts contributing to her anxiety level, redirected patient from rambling from one thought to another and assisted with goal directed thought. Problem-solved how to organize her next visit with her psychiatrist to ensure she gets what she needs from the session. Encouraged the use of biofeedback to help reduce the racing thoughts.   Plan: Return again in 1 week to participate in the Aromatherapy presentation for anxiety management to discuss at her next therapy session the following week.   Diagnosis: Axis I: Anxiety Disorder NOS and Major Depression, Recurrent severe    Axis II:  None    Pascal Stiggers E, LCSW 07/24/2011

## 2011-08-05 ENCOUNTER — Encounter (HOSPITAL_COMMUNITY): Payer: Self-pay | Admitting: Psychiatry

## 2011-08-05 ENCOUNTER — Ambulatory Visit (INDEPENDENT_AMBULATORY_CARE_PROVIDER_SITE_OTHER): Payer: 59 | Admitting: Psychiatry

## 2011-08-05 VITALS — Wt 237.0 lb

## 2011-08-05 DIAGNOSIS — F329 Major depressive disorder, single episode, unspecified: Secondary | ICD-10-CM

## 2011-08-05 MED ORDER — NORTRIPTYLINE HCL 50 MG PO CAPS: 100.0000 mg | ORAL_CAPSULE | Freq: Every day | ORAL | Status: DC

## 2011-08-05 MED ORDER — DIAZEPAM 5 MG PO TABS: 5.0000 mg | ORAL_TABLET | Freq: Every day | ORAL | Status: DC

## 2011-08-05 NOTE — Progress Notes (Signed)
Chief complaint I feel more nervous since increase trazodone.    History of presenting illness Patient is 36 year old Caucasian married unemployed female who came for her followup appointment.  On her last visit she complained of poor sleep and we have restarted trazodone 150 mg.  Patient complained increased nervousness and racing thoughts since trazodone increased .  She feel nervous and jittery .  She likes nortriptyline which is helping her depression and recent he denies any suicidal thinking and homicidal thinking .  She admitted taking Valium left over one day that calm her down .  She is seeing therapist regularly.  She continues to have psychosocial stressors .  She is living with her husband however relationship is still very tense .  Patient denies any recent physical abuse .  Patient has a supportive parent's .  She endorse nervousness among people and crowds however she is doing regular exercise and watching her diet and very pleased as she has lost weight.  Patient denies any active or passive suicidal thinking and homicidal thinking.  Current psychiatric medication Trazodone 150 mg at bedtime Nortriptyline 100 mg at bedtime  Past psychiatric history Patient has significant history of depression.  In the past she had tried Abilify, Risperdal, Cymbalta, Prozac, Lamictal, Wellbutrin, lithium, BuSpar and ECT.  Patient has multiple psychiatric inpatient treatment.  She has done intensive outpatient program few times.  Medical history Patient has history of polycystic ovary disease, obesity and chronic pain.  Recently she has hemoglobin A1c which was 5.2.  Alcohol and substance use history Patient denies any history of alcohol or substance use  Mental status examination Patient is casually dressed and fairly groomed. She is anxious but cooperative.  She maintained good eye contact.  She described her mood as anxious and nervous and her affect is constricted.  She is relevant in  conversation.  Her speech is slow but coherent.  Her thought processes logical linear and goal-directed.  She has poverty of speech and thought content but she denies any active or passive suicidal thoughts or homicidal thoughts. There were no psychotic symptoms present. Her attention and concentration is fair. Her thought process also slow. There were no extrapyramidal side effects or shakes present at this time. She's alert oriented x3. Her insight judgment and pulse control is okay.  Assessment Axis I Maj. depressive disorder Axis II deferred Axis III see medical history Axis IV moderate  Plan I will reduce her trazodone to 50 mg at bedtime as needed.  I will start Valium 5 mg daily for anxiety.  Patient had a good response in the past however patient decided to come off as she does not want to get addicted .  Patient does not drink or use any drugs.  I explained that Valium 5 mg will help her anxiety and she should take trazodone only as needed for sleep.  I will continue nortriptyline at present dose.  She will continue to see therapist and I recommend to try to see her 2 times a week for increase coping and social skills.  I will see her again in 3 weeks.  I recommended to call us if she is a question concerns or if she feels worsening of the symptoms.  I reinforced safety plan that in case if she felt that she is in danger from her husband or from some one else then she need to call 911 or go to local ER.

## 2011-08-06 ENCOUNTER — Ambulatory Visit (INDEPENDENT_AMBULATORY_CARE_PROVIDER_SITE_OTHER): Payer: 59 | Admitting: Family Medicine

## 2011-08-06 VITALS — BP 110/90 | HR 99 | Resp 16 | Ht 66.0 in | Wt 235.1 lb

## 2011-08-06 DIAGNOSIS — R5383 Other fatigue: Secondary | ICD-10-CM

## 2011-08-06 DIAGNOSIS — I10 Essential (primary) hypertension: Secondary | ICD-10-CM

## 2011-08-06 DIAGNOSIS — Z79899 Other long term (current) drug therapy: Secondary | ICD-10-CM

## 2011-08-06 DIAGNOSIS — F3289 Other specified depressive episodes: Secondary | ICD-10-CM

## 2011-08-06 DIAGNOSIS — F329 Major depressive disorder, single episode, unspecified: Secondary | ICD-10-CM

## 2011-08-06 DIAGNOSIS — R5381 Other malaise: Secondary | ICD-10-CM

## 2011-08-06 DIAGNOSIS — R42 Dizziness and giddiness: Secondary | ICD-10-CM

## 2011-08-06 DIAGNOSIS — H6500 Acute serous otitis media, unspecified ear: Secondary | ICD-10-CM

## 2011-08-06 DIAGNOSIS — E669 Obesity, unspecified: Secondary | ICD-10-CM

## 2011-08-06 DIAGNOSIS — R7301 Impaired fasting glucose: Secondary | ICD-10-CM

## 2011-08-06 MED ORDER — ERYTHROMYCIN BASE 500 MG PO TABS: 500.0000 mg | ORAL_TABLET | Freq: Three times a day (TID) | ORAL | Status: DC

## 2011-08-06 MED ORDER — FLUCONAZOLE 150 MG PO TABS: ORAL_TABLET | ORAL | Status: AC

## 2011-08-06 NOTE — Patient Instructions (Signed)
F/u as before.  You are being treated for right serous otiits media. Medication is sent to your pharmacy.  Your throat is being swabbed to check for strep in the office.  You are being referred to cardiology for further evaluation of recurent light headedness with syncope. In the office today you are not orthostatic. Please ensure you drink sufficient fluids and eat regularly  CBC, fasting chem 7, also calcium and magnesium, lipids and cbc , as soon as possible

## 2011-08-06 NOTE — Progress Notes (Signed)
  Subjective:    Patient ID: Katelyn [name], female    DOB: Feb 08, 1976, 36 y.o.   MRN: [id number]  HPI  10 day h/o right ear pain, sore throat and tender glands under the jaw.low grade fever  And chills. Non productive cough. Intermittent light headedness x 2 month, for the first time she blacked out on standing for approx 1 minute, has had h/o palpitations intermittently, denies chest pain. Pt is on very disciplined change in her eating as well as an exercise routine , which has facilitated very successful weigh loss.Reports eating regularly. Her mental health is improving , and she remains under the care of psychiatry  Review of Systems See HPI Denies recent fever or chills. Denies sinus pressure, nasal congestion,  Denies chest congestion, productive cough or wheezing. Denies chest pains, palpitations and leg swelling Denies abdominal pain, nausea, vomiting,diarrhea or constipation.   Denies dysuria, frequency, hesitancy or incontinence. Denies joint pain, swelling and limitation in mobility. Denies headaches, seizures, numbness, or tingling.  Denies skin break down or rash.        Objective:   Physical Exam Patient alert and oriented and in no cardiopulmonary distress.  HEENT: No facial asymmetry, EOMI, no sinus tenderness,  oropharynx pink and moist.  Neck supple anterior right cervical aenopathy.Tm on right dull with mild eythema  Chest: Clear to auscultation bilaterally.  CVS: S1, S2 no murmurs, no S3.Pt not orthostatic on exam  ABD: Soft non tender. Bowel sounds normal.  Ext: No edema  MS: Adequate ROM spine, shoulders, hips and knees.  Skin: Intact, no ulcerations or rash noted.  Psych: Good eye contact, blunted affect. Memory intact not anxious or depressed appearing.  CNS: CN 2-12 intact, power, tone and sensation normal throughout.        Assessment & Plan:

## 2011-08-07 ENCOUNTER — Encounter: Payer: Self-pay | Admitting: Cardiology

## 2011-08-07 ENCOUNTER — Ambulatory Visit (HOSPITAL_COMMUNITY): Payer: Self-pay | Admitting: Psychiatry

## 2011-08-07 ENCOUNTER — Ambulatory Visit (INDEPENDENT_AMBULATORY_CARE_PROVIDER_SITE_OTHER): Payer: 59 | Admitting: Cardiology

## 2011-08-07 VITALS — BP 186/104 | HR 96 | Resp 16 | Ht 66.0 in | Wt 230.0 lb

## 2011-08-07 DIAGNOSIS — I10 Essential (primary) hypertension: Secondary | ICD-10-CM

## 2011-08-07 DIAGNOSIS — R7301 Impaired fasting glucose: Secondary | ICD-10-CM

## 2011-08-07 DIAGNOSIS — R42 Dizziness and giddiness: Secondary | ICD-10-CM

## 2011-08-07 DIAGNOSIS — R030 Elevated blood-pressure reading, without diagnosis of hypertension: Secondary | ICD-10-CM | POA: Insufficient documentation

## 2011-08-07 DIAGNOSIS — R55 Syncope and collapse: Secondary | ICD-10-CM

## 2011-08-07 DIAGNOSIS — Z0189 Encounter for other specified special examinations: Secondary | ICD-10-CM

## 2011-08-07 DIAGNOSIS — E669 Obesity, unspecified: Secondary | ICD-10-CM | POA: Insufficient documentation

## 2011-08-07 LAB — BASIC METABOLIC PANEL
CO2: 27 mEq/L (ref 19–32)
Calcium: 9.8 mg/dL (ref 8.4–10.5)
Chloride: 104 mEq/L (ref 96–112)
Creat: 0.91 mg/dL (ref 0.50–1.10)
Glucose, Bld: 81 mg/dL (ref 70–99)

## 2011-08-07 LAB — CBC WITH DIFFERENTIAL/PLATELET
Eosinophils Absolute: 0.1 10*3/uL (ref 0.0–0.7)
Eosinophils Relative: 1 % (ref 0–5)
HCT: 42.8 % (ref 36.0–46.0)
Hemoglobin: 13.7 g/dL (ref 12.0–15.0)
Lymphocytes Relative: 27 % (ref 12–46)
Lymphs Abs: 2.7 10*3/uL (ref 0.7–4.0)
MCH: 30 pg (ref 26.0–34.0)
MCV: 93.7 fL (ref 78.0–100.0)
Monocytes Absolute: 0.7 10*3/uL (ref 0.1–1.0)
Monocytes Relative: 7 % (ref 3–12)
Platelets: 467 10*3/uL — ABNORMAL HIGH (ref 150–400)
RBC: 4.57 MIL/uL (ref 3.87–5.11)
WBC: 10 10*3/uL (ref 4.0–10.5)

## 2011-08-07 LAB — LIPID PANEL
LDL Cholesterol: 91 mg/dL (ref 0–99)
Triglycerides: 91 mg/dL (ref <150)
VLDL: 18 mg/dL (ref 0–40)

## 2011-08-07 NOTE — Patient Instructions (Signed)
Your physician recommends that you schedule a follow-up appointment in: 1 month  Your physician has requested that you regularly monitor and record your blood pressure readings at home. Please use the same machine at the same time of day to check your readings and record them to bring to your follow-up visit.  Your physician has recommended that you wear an event monitor. Event monitors are medical devices that record the heart's electrical activity. Doctors most often [place] these monitors to diagnose arrhythmias. Arrhythmias are problems with the speed or rhythm of the heartbeat. The monitor is a small, portable device. You can wear one while you do your normal daily activities. This is usually used to diagnose what is causing palpitations/syncope (passing out).

## 2011-08-07 NOTE — Progress Notes (Signed)
Patient ID: Katelyn [name], female   DOB: 28-Apr-1976, 36 y.o.   MRN: [id number]  HPI: Pleasant young retired Engineer, civil (consulting) evaluated at the kind request of Dr. Lodema Hong for lightheadedness with a brief loss of consciousness.  This nice woman previously worked for [employer], but more recently has provided care for her 2 children, ages 57 and 39.  She was previously evaluated by Dr. Daleen Squibb of our practice in 2010 for chest discomfort.  An echocardiogram was normal, and symptoms resolved spontaneously.  Recently, she experienced an episode of lightheadedness with standing and subsequently fell back on the couch after an apparent brief loss of consciousness.  There was no incontinence or other symptoms.  Mental status rapidly returned to normal.  She has had no history of loss of consciousness.  She denies any recent intercurrent illnesses, extreme dieting, fluid or salt deprivation or significant change in her medications.  There was no orthostatic change in her blood pressure when she was 1st evaluated after this event.  She is currently being treated for otitis media.  Prior to Admission medications   Medication Sig Start Date End Date Taking? Authorizing Provider  acetaminophen (TYLENOL) 325 MG tablet Take 650 mg by mouth every 6 (six) hours as needed.   Yes Historical Provider, MD  diazepam (VALIUM) 5 MG tablet Take 1 tablet (5 mg total) by mouth daily. 08/05/11 08/04/12 Yes Cleotis Nipper, MD  fluconazole (DIFLUCAN) 150 MG tablet One tablet once daily, as needed for vaginal itch 08/06/11 08/07/11 Yes Kerri Perches, MD  metFORMIN (GLUCOPHAGE) 500 MG tablet Take 500 mg by mouth 2 (two) times daily with a meal.     Yes Historical Provider, MD  nortriptyline (PAMELOR) 50 MG capsule Take 2 capsules (100 mg total) by mouth at bedtime. 08/05/11  Yes Cleotis Nipper, MD  traZODone (DESYREL) 50 MG tablet Take 1 tablet (50 mg total) by mouth at bedtime. 08/05/11  Yes Cleotis Nipper, MD   Allergies  Allergen Reactions  .  Lithium     Catatonic state  . Penicillins     REACTION: Rash   Past medical history, social history, and family history reviewed and updated.  ROS: Denies palpitations, orthopnea, PND, exertional dyspnea, chest discomfort.  She has lost 75 pounds with long-term caloric restriction.  Intermittent modest rectal bleeding attributed to hemorrhoids; daytime somnolence; intermittent hip pain.  HeAll other systems reviewed and are negative.  PHYSICAL EXAM: BP 186/104  Pulse 96  Resp 16  Ht 5\' 6"  (1.676 m)  Wt 104.327 kg (230 lb)  BMI 37.12 kg/m2; no orthostatic change in blood pressure  General-Well developed; no acute distress Body habitus-Overweight Neck-No JVD; no carotid bruits Lungs-clear lung fields; resonant to percussion Cardiovascular-normal PMI; normal S1 and S2; modest systolic ejection murmur at the cardiac base Abdomen-normal bowel sounds; soft and non-tender without masses or organomegaly Musculoskeletal-No deformities, no cyanosis or clubbing Neurologic-Normal cranial nerves; symmetric strength and tone Skin-Warm, no significant lesions Extremities-distal pulses intact; no edema  EKG: Sinus tachycardia at a rate of 104 BPM, mild nonspecific ST-T wave abnormality, modest and nondiagnostic inferior Q waves.  No previous tracing for comparison.  ASSESSMENT AND PLAN:  [place] Bing, MD 08/07/2011 1:45 PM
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
## 2011-08-07 NOTE — Assessment & Plan Note (Addendum)
By patient's description, episode was orthostatic in nature; however, there is no evidence for orthostatic hypotension at present.  This may indicate transient spells of decreased blood pressure or a different etiology.  Katelyn [name] will carry an event recorder for the next few weeks and monitor blood pressure and heart rate.  If testing is negative, and no additional episodes occur, additional assessment will likely not be necessary at present.

## 2011-08-13 ENCOUNTER — Other Ambulatory Visit: Payer: Self-pay | Admitting: Cardiology

## 2011-08-13 DIAGNOSIS — R55 Syncope and collapse: Secondary | ICD-10-CM

## 2011-08-14 ENCOUNTER — Ambulatory Visit (INDEPENDENT_AMBULATORY_CARE_PROVIDER_SITE_OTHER): Payer: 59 | Admitting: Psychiatry

## 2011-08-17 ENCOUNTER — Encounter: Payer: Self-pay | Admitting: Family Medicine

## 2011-08-17 NOTE — Assessment & Plan Note (Signed)
Antibiotic course prescribed 

## 2011-08-17 NOTE — Assessment & Plan Note (Signed)
H/o recurrent light headedness with reported LOC on 04/02, refer to cardiology for further eval

## 2011-08-17 NOTE — Assessment & Plan Note (Signed)
Improved, in intense therapy as well as treatment by psychiatry. Still unable to return to gainful employment at this time

## 2011-08-17 NOTE — Assessment & Plan Note (Signed)
Improved. Pt applauded on succesful weight loss through lifestyle change, and encouraged to continue same. Weight loss goal set for the next several months.  

## 2011-08-17 NOTE — Assessment & Plan Note (Signed)
Elevated at this visit, no medication at this time, espescialy in light of h/o recurrent near syncope

## 2011-08-21 ENCOUNTER — Ambulatory Visit (INDEPENDENT_AMBULATORY_CARE_PROVIDER_SITE_OTHER): Payer: 59 | Admitting: Psychiatry

## 2011-08-21 DIAGNOSIS — F3289 Other specified depressive episodes: Secondary | ICD-10-CM

## 2011-08-21 DIAGNOSIS — F411 Generalized anxiety disorder: Secondary | ICD-10-CM

## 2011-08-21 DIAGNOSIS — F419 Anxiety disorder, unspecified: Secondary | ICD-10-CM

## 2011-08-21 DIAGNOSIS — F32A Depression, unspecified: Secondary | ICD-10-CM

## 2011-08-21 DIAGNOSIS — F329 Major depressive disorder, single episode, unspecified: Secondary | ICD-10-CM

## 2011-08-22 NOTE — Progress Notes (Signed)
   THERAPIST PROGRESS NOTE  Session Time: 2:00-3:00 pm  Participation Level: Active  Behavioral Response: CasualAlertAnxious  Type of Therapy: Individual Therapy  Treatment Goals addressed: Anxiety  Interventions: Motivational Interviewing and Strength-based  Summary: Katelyn [name] is a 36 y.o. female who presents with normal affect and behavior but states she feels anxious internally. She described her depression as low, but her anxiety as high. She states she is "uncertain" how she feels and is aware externally she appears calm, while in the inside she feels anxious. She describes have periods where she feels her "blood sugar feels low" and is uncertain of the cause. She is working with her PCP to explore medical causes and is wearing a heart monitor to track her heart levels to determine if that is a factor. She also states she is frustrated with not losing weight as quickly as she would like and is only eating one meal per day and is extremely focused on her health and weight loss. When challenged on the dangers of this, she lacks insight into how her behaviors is unhealthy.  She is managing her anxiety symptoms and challenging her tendency to withdraw. She has plans to go to [employer] park next Friday to practice for her upcoming June trip to [place]. Her goal is to make sure she can fit into the ride seats and practice being around crowds. She is very preoccupied with her weight and the anxiety she feels about her need to fly soon and fear that she won't fit in the airline seat. Overall she is making progress with her anxiety in that she is able to function socially and do things with her family despite the overwhelming feelings of fear.   Suicidal/Homicidal: Nowithout intent/plan  Therapist Response: Used motivational interviewing to assess patients need to change how she is going about trying to lose weight. Attempted to bring insight into unhealthy behaviors and create a  need for change in this area. Identified areas of improvement with how patient is learning to manage anxiety symptoms.   Plan: Return again in 2 weeks. Pateint cant attend next week due to having a busy schedule with family obligations. Follow up on trip to [place] and how this was helpful with preparing for upcoming vacation.   Diagnosis: Axis I: Anxiety Disorder NOS and Depressive Disorder NOS    Axis II: None    Galadriel Shroff E, LCSW 08/22/2011

## 2011-08-26 ENCOUNTER — Encounter (HOSPITAL_COMMUNITY): Payer: Self-pay | Admitting: Psychiatry

## 2011-08-26 ENCOUNTER — Ambulatory Visit (INDEPENDENT_AMBULATORY_CARE_PROVIDER_SITE_OTHER): Payer: 59 | Admitting: Psychiatry

## 2011-08-26 VITALS — Wt 230.0 lb

## 2011-08-26 DIAGNOSIS — F329 Major depressive disorder, single episode, unspecified: Secondary | ICD-10-CM

## 2011-08-26 MED ORDER — NORTRIPTYLINE HCL 75 MG PO CAPS: 75.0000 mg | ORAL_CAPSULE | Freq: Every day | ORAL | Status: DC
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
## 2011-08-29 LAB — NORTRIPTYLINE LEVEL: Nortriptyline Lvl: 272 mcg/L — ABNORMAL HIGH (ref 50–150)

## 2011-08-31 ENCOUNTER — Other Ambulatory Visit (HOSPITAL_COMMUNITY): Payer: Self-pay | Admitting: Psychiatry

## 2011-09-02 ENCOUNTER — Other Ambulatory Visit (HOSPITAL_COMMUNITY): Payer: Self-pay | Admitting: Psychiatry

## 2011-09-02 DIAGNOSIS — F329 Major depressive disorder, single episode, unspecified: Secondary | ICD-10-CM

## 2011-09-02 MED ORDER — NORTRIPTYLINE HCL 50 MG PO CAPS: 50.0000 mg | ORAL_CAPSULE | Freq: Every day | ORAL | Status: DC

## 2011-09-02 NOTE — Progress Notes (Signed)
Called patient to discuss Nortriptyline level which is 272 (normal 50-150).  Recommended to try nortriptyline 50 mg at bedtime.  She was taking 100 mg and on her last visit we reduced to 75 mg.  We will further decrease to 50 mg .  Patient agreed with the plan.  I will call pharmacy with new prescription.

## 2011-09-04 ENCOUNTER — Ambulatory Visit (INDEPENDENT_AMBULATORY_CARE_PROVIDER_SITE_OTHER): Payer: 59 | Admitting: Psychiatry

## 2011-09-04 DIAGNOSIS — F319 Bipolar disorder, unspecified: Secondary | ICD-10-CM

## 2011-09-04 NOTE — Progress Notes (Signed)
   THERAPIST PROGRESS NOTE  Session Time: 2:00-2:50 pm  Participation Level: Active  Behavioral Response: Neat and Well GroomedAlertAnxious  Type of Therapy: Individual Therapy  Treatment Goals addressed: Diagnosis: Anxiety and Paranoia  Interventions: Motivational Interviewing and Supportive  Summary: Katelyn [name] is a 36 y.o. female who presents with anxious mood and affect. She reports her depression being minimal with her anxiety being extremely high. She states she has been forgetful and her thoughts appear scattered. She described her most recent visit with Dr. Lolly Mustache, psychiatrist and described her desire to not take any medications anymore because she fears their are things in them that will "harm her body". She states she has been feeling paranoid and looking into government conspirecies she feels could be responsible for recent events in the news. She states she wants to empty her entire house and "free it from all toxins". She lacks insight into the unhealthiness of her thinking and judgment. She states she feels "overwhelmed" but states she does not know what to change or stop. She states she does not know if she needs help and believes diet and exersize to be working for her with regards to mood management and she does not want to look to ECT or medication to help her with the paranoia despite feeling overwhelmed with it.   Suicidal/Homicidal: Nowithout intent/plan  Therapist Response: Assessed current level of functioning and paranoia. Explored her trust in Dr. Florentina Jenny and Clinical research associate as her treating providers, Expressed concerns about her lack of insight and severe level of paranoia as well as consequences that could escalate to inpatient hospitalization if patient does not manage her symptoms. Reviewed safety planning, assessed for need for inpatient hospitalization, but criteria was not met. Reinforced concern for wanting patient to be well and safe.   Plan: Return again in 1  week. Assess overall level of functioning and level of paranoia.   Diagnosis: Axis I: Bipolar, mixed    Axis II: None    Katelyn Poppen E, LCSW 09/04/2011

## 2011-09-09 ENCOUNTER — Ambulatory Visit (INDEPENDENT_AMBULATORY_CARE_PROVIDER_SITE_OTHER): Payer: 59 | Admitting: Psychiatry

## 2011-09-09 DIAGNOSIS — F419 Anxiety disorder, unspecified: Secondary | ICD-10-CM

## 2011-09-09 DIAGNOSIS — F339 Major depressive disorder, recurrent, unspecified: Secondary | ICD-10-CM

## 2011-09-09 DIAGNOSIS — F411 Generalized anxiety disorder: Secondary | ICD-10-CM

## 2011-09-09 NOTE — Progress Notes (Signed)
   THERAPIST PROGRESS NOTE  Session Time: 2:00-2:50 pm  Participation Level: Active  Behavioral Response: CasualAlertAnxious and Depressed  Type of Therapy: Individual Therapy  Treatment Goals addressed: Anxiety and Diagnosis: Depression  Interventions: Solution Focused, Supportive and Other: Education  Summary: Katelyn [name] is a 36 y.o. female who presents with depressed and anxious mood and tearful affect. She reports an increase in her depressive symptoms and states she did not feel "safe" this past Saturday and contemplated going back into the hospital. She states she did not tell anyone in her family because she was afraid she would "dissapoint" them if they knew her depression returned. She cried as she talked about the realization that her depression is back and how disappointed she is herself. She states she thought it was gone. She talked of how impressed her family family and her friend [name] is with how much weight she is from diet and exercise that they just expect her to feel good. She is aware that she has some unrealistic and compulsive thoughts such as she can't go into the hospital because she won't be able to exercise enough. She also is aware of a need to resume her old medication regimen and contacted the psychiatrist during the visit to ask permission to increase her Nortriptyline from 50 mg to 75 mg. She talked of not wanting to return to ECT treatment due to how taxing it was on her body, but she states it was helpful. She states she may need to give herself permission to "be depressed" for a few days and embrace her illness instead of trying to pretend she does not have it. She is also going to explore how she wants to involve her friend [name] more in educating her on her depression. She denies suicidal thoughts currently and agrees to Higher education careers adviser or go to the emergency room if her depression worsens before next weeks session.   Suicidal/Homicidal: Nowithout  intent/plan  Therapist Response: Assessed for safety and overall level of functioning, challenged negative, distorted thinking about unrealistic expectations and her illness of depression, educated patient on the importance of accepting and maintaining her illness to prevent relapse and how to act quickly upon the first sign of depression returning. Talked with Dr. Lolly Mustache and confirmed patient can increase Nortriptyline. Discussed involving her family more in her care.   Plan: Return again in 1 week. Assess for safety, make a plan to increase and then manage depression symptoms.   Diagnosis: Axis I: Anxiety Disorder NOS and Major Depression, Recurrent severe    Axis II: None    Katelyn Simao E, LCSW 09/09/2011

## 2011-09-10 ENCOUNTER — Encounter: Payer: Self-pay | Admitting: Internal Medicine

## 2011-09-12 ENCOUNTER — Encounter: Payer: Self-pay | Admitting: Cardiology

## 2011-09-12 ENCOUNTER — Ambulatory Visit (INDEPENDENT_AMBULATORY_CARE_PROVIDER_SITE_OTHER): Payer: 59 | Admitting: Cardiology

## 2011-09-12 VITALS — BP 133/94 | HR 107 | Resp 16 | Ht 66.0 in | Wt 220.0 lb

## 2011-09-12 DIAGNOSIS — R42 Dizziness and giddiness: Secondary | ICD-10-CM

## 2011-09-12 DIAGNOSIS — I10 Essential (primary) hypertension: Secondary | ICD-10-CM

## 2011-09-12 NOTE — Progress Notes (Signed)
Patient ID: Katelyn [name], female   DOB: 30-May-1975, 36 y.o.   MRN: [id number]  HPI: Scheduled return visit for continuing assessment of possible syncope.  Since her last visit, Katelyn [name] has done generally well.  She continues to experience brief episodes of lightheadedness that resolved spontaneously.  She has not suffered any falls nor any apparent loss of consciousness.  She was found to have an elevated nortriptyline level prompting a decrease in the dosage of that medication.  Prior to Admission medications   Medication Sig Start Date End Date Taking? Authorizing Provider  acetaminophen (TYLENOL) 325 MG tablet Take 650 mg by mouth every 6 (six) hours as needed.   Yes Historical Provider, MD  metFORMIN (GLUCOPHAGE) 500 MG tablet Take 500 mg by mouth 2 (two) times daily with a meal.     Yes Historical Provider, MD  nortriptyline (PAMELOR) 50 MG capsule Take 75 mg by mouth at bedtime. 09/02/11  Yes Cleotis Nipper, MD   Allergies  Allergen Reactions  . Lithium     Catatonic state  . Penicillins     REACTION: Rash     Past medical history, social history, and family history reviewed and updated.  PHYSICAL EXAM: BP 133/94  Pulse 107  Resp 16  Ht 5\' 6"  (1.676 m)  Wt 99.791 kg (220 lb)  BMI 35.51 kg/m2   ASSESSMENT AND PLAN:  A 25 minute visit was spent in discussion of the patient's symptoms.  I explained that nortriptyline can cause orthostatic hypotension, but none has been documented.  Alternatively, this drug may have been contributing directly to dizziness without intervening decreased blood pressure and cerebral hypoperfusion.  At this point, I do not think it is likely that she has significant pathology contributing to her symptoms.  She reports daily exercise since October and an 80 pound weight loss.  We discussed an appropriate target heart rate of 75-85% of age-predicted maximum, which would be 150-to 175 bpm for her.  We also discussed behavioral measures appropriate to  dealing with her mild lightheadedness.  Finally, she inquired about her minimally elevated platelet level of 467.  Of 5 tests performed since 2010, this is the only abnormality and likely represents either a reactive thrombocythemia or a normal variant.  I advised her that no further testing is warranted at the present time nor is any treatment required.  I will be happy to see this nice woman again on an as-needed basis.  [place] Bing, MD 09/12/2011 12:36 PM

## 2011-09-12 NOTE — Assessment & Plan Note (Signed)
Event recording revealed episodes of tachycardia to heart rates of 160-170 bpm, sometimes with associated palpitations and sometimes without.  This appeared to be sinus tachycardia.  Patient agrees that they may have been associated with anxiety or activity.  No arrhythmias documented, and no further cardiac testing is required at present.

## 2011-09-12 NOTE — Patient Instructions (Signed)
Your physician recommends that you schedule a follow-up appointment in: PRN 
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
## 2011-09-12 NOTE — Assessment & Plan Note (Addendum)
Blood pressure log returned with approximately 100 determinations, all of them normal.  Heart rate varied from 70-115.  She does not appear to require pharmacologic therapy at present.  Continued exercise and weight loss are encouraged.

## 2011-09-18 ENCOUNTER — Ambulatory Visit (INDEPENDENT_AMBULATORY_CARE_PROVIDER_SITE_OTHER): Payer: 59 | Admitting: Psychiatry

## 2011-09-18 DIAGNOSIS — F419 Anxiety disorder, unspecified: Secondary | ICD-10-CM

## 2011-09-18 DIAGNOSIS — F339 Major depressive disorder, recurrent, unspecified: Secondary | ICD-10-CM

## 2011-09-18 DIAGNOSIS — F411 Generalized anxiety disorder: Secondary | ICD-10-CM

## 2011-09-18 NOTE — Progress Notes (Signed)
   THERAPIST PROGRESS NOTE  Session Time: 2;00-2:50 pm  Participation Level: Active  Behavioral Response: CasualAlertAnxious and Depressed  Type of Therapy: Individual Therapy  Treatment Goals addressed: Anxiety and Diagnosis: Depression  Interventions: DBT, Solution Focused and Other: Safety Plannin  Summary: Katelyn [name] is a 36 y.o. female who presents with depressed mood and affect. She reports increased depression from last session, with less vocalized paranoia. Her depression seems to be increasing during each visit and this was pointed out to the patient, as has been her pattern of allowing herself to get to "rock bottom" before she is willing take action towards treating her depression. She acknowledged this pattern of behavior and states she feels she needs ECT, but is not ready for that yet. She stats she has been having thoughts of "not wanting to be here", but denied active suicidal thoughts and a plan. She states she has informed her family since the last session of her depression returning and they are now aware. She will be going to the beach tomorrow and will be with her parents, husband and children. She acknowledged several stressors including her children getting out of school for the summer tomorrow, her husband not treating her as she would like him to and her neighbor putting pressure on her.  She expressed a pattern of "all or nothing" thinking and gave examples of how she is going to extremes in all areas of her life and struggling to see any middle ground of how to take care of herself.   Suicidal/Homicidal: Nowithout intent/plan  Therapist Response: Assess for safety and for a need for inpatient hospitalization, but she did not meet criteria. Created a safety plan. Challenged negative distorted thinking, and braintormed ways to challenge depression symptoms.   Plan: Return again in 2 weeks after she returns from the beach with her family.   Diagnosis: Axis I:  Major Depression, Recurrent severe    Axis II: None    Aleene Swanner E, LCSW 09/18/2011

## 2011-09-25 ENCOUNTER — Encounter (HOSPITAL_COMMUNITY): Payer: Self-pay | Admitting: Psychiatry

## 2011-09-25 ENCOUNTER — Ambulatory Visit (INDEPENDENT_AMBULATORY_CARE_PROVIDER_SITE_OTHER): Payer: 59 | Admitting: Psychiatry

## 2011-09-25 VITALS — Wt 221.0 lb

## 2011-09-25 DIAGNOSIS — F329 Major depressive disorder, single episode, unspecified: Secondary | ICD-10-CM

## 2011-09-25 MED ORDER — ZIPRASIDONE HCL 40 MG PO CAPS: 40.0000 mg | ORAL_CAPSULE | Freq: Every day | ORAL | Status: DC

## 2011-09-25 MED ORDER — NORTRIPTYLINE HCL 25 MG PO CAPS: 25.0000 mg | ORAL_CAPSULE | Freq: Every day | ORAL | Status: DC
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
## 2011-10-02 ENCOUNTER — Ambulatory Visit (INDEPENDENT_AMBULATORY_CARE_PROVIDER_SITE_OTHER): Payer: 59 | Admitting: Psychiatry

## 2011-10-02 DIAGNOSIS — F339 Major depressive disorder, recurrent, unspecified: Secondary | ICD-10-CM

## 2011-10-03 NOTE — Progress Notes (Signed)
   THERAPIST PROGRESS NOTE  Session Time: 2:00-2:50 pm  Participation Level: Active  Behavioral Response: Casual, Neat and Well GroomedAlert and ConfusedAnxious and Depressed  Type of Therapy: Individual Therapy  Treatment Goals addressed: Anxiety and Diagnosis: Depression, Paranoia  Interventions: Motivational Interviewing, Solution Focused and Strength-based  Summary: Katelyn [name] is a 36 y.o. female who presents with depressed, anxious and paranoid mood. She reports continued confused thinking that remains the same if not worse as the previous session. She states she loses track of what she is thinking and has poor memory. She states depression is improved some after a medication change by Dr. Lolly Mustache at her previous appointment. She continues to have paranoid feelings about taking medications or putting anything "unhealhty" in her body. She continues to be obsessed with health and exercise and is spending about three hours at least daily on working out.  She states she was prescribed Geodon and has not taken it yet due to fears of putting it in her body. She is aware the thoughts are illogical, but struggles to challenge them. She states her friend [name] is telling her not to take medications or get ECT treatment and patients has a need to please her, although she does not know where this need comes from. She states she is not happy with where she is with her depression and anxiety but feels she is able to function outwardly better than she has before and fears losing this ability. She also states she enjoyed herself last week when her daughter (age 55 - [place]) spent a few days away, allowing her time with her son (age 87 - [place] Signs) and her husband time to themselves. She states her daughter causes anxiety due to her high energy level and neediness. She states she would like to explore ways to have her spend some time each week with her parents to give her some down time. Patient describes her  home life as without much structure and states she would like more structure and less chaos in her home.   Suicidal/Homicidal: Nowithout intent/plan  Therapist Response: Assessed level of functioning and paranoia, assessed for safety. Explored barriers to taking medications, used motivational interviewing to explore ambivalence and identify needs regarding improving wellness.   Plan: Return again in 1 weeks. Continue assessing paranoia and the possible need to a higher level of care. Inquire about taking Geodon. Help patient prepare for family vacation to [place] the following week.   Diagnosis: Axis I: Major Depression, Recurrent severe    Axis II: None    Shamar Kracke E, LCSW 10/03/2011

## 2011-10-09 ENCOUNTER — Ambulatory Visit (INDEPENDENT_AMBULATORY_CARE_PROVIDER_SITE_OTHER): Payer: 59 | Admitting: Psychiatry

## 2011-10-09 DIAGNOSIS — F339 Major depressive disorder, recurrent, unspecified: Secondary | ICD-10-CM

## 2011-10-09 NOTE — Progress Notes (Signed)
   THERAPIST PROGRESS NOTE  Session Time: 2:00-2:50 pm  Participation Level: Active  Behavioral Response: CasualAlertAnxious  Type of Therapy: Individual Therapy  Treatment Goals addressed: Anxiety  Interventions: Solution Focused  Summary: Katelyn [name] is a 36 y.o. female who presents with anxious mood and affect. She reports racing thoughts, obsessive compulsive behaviors which are increasing in intensity. She is very focused on how to handle her trip to [place] where she will be with her family for the next two weeks. She described a need to control her children and husbands behaviors while on vacation and how she already foresees stress with not being able to keep the same rigid routine she keeps with them at home. She wants them to do homework daily and not eat anything unhealthy despite being on summer vacation from school. She continues to over exercise and her family is commenting on how she is overly focused on it. She is aware of this extreme anxiety and obtrusiveness, but does not know how to change it. She states she did not take the Geodon yet and continues to have paranoid thinking. She states she has taped the video feature on her computer after learning in the news this past week about how the government is tracking phone calls.   Suicidal/Homicidal: Nowithout intent/plan  Therapist Response: challenged paranoid thinking, her need to control others in her family and her unrealistic ideas of vacation. Explored anxiety reduction techniques.   Plan: Return again in 2 weeks.  Diagnosis: Axis I: Generalized Anxiety Disorder    Axis II: None    Tobby Fawcett E, LCSW 10/09/2011

## 2011-10-21 ENCOUNTER — Encounter (HOSPITAL_COMMUNITY): Payer: Self-pay | Admitting: Psychiatry

## 2011-10-21 ENCOUNTER — Other Ambulatory Visit (HOSPITAL_COMMUNITY): Payer: Self-pay | Admitting: Psychiatry

## 2011-10-21 ENCOUNTER — Ambulatory Visit (INDEPENDENT_AMBULATORY_CARE_PROVIDER_SITE_OTHER): Payer: 59 | Admitting: Psychiatry

## 2011-10-21 DIAGNOSIS — F329 Major depressive disorder, single episode, unspecified: Secondary | ICD-10-CM

## 2011-10-21 DIAGNOSIS — F323 Major depressive disorder, single episode, severe with psychotic features: Secondary | ICD-10-CM

## 2011-10-21 MED ORDER — NORTRIPTYLINE HCL 75 MG PO CAPS: 75.0000 mg | ORAL_CAPSULE | Freq: Every day | ORAL | Status: DC

## 2011-10-21 NOTE — Progress Notes (Signed)
Chief complaint Medication management and followup.  History presenting illness Patient is 36 year old female who came for her followup appointment.  On her last visit we started Geodon for paranoia and hallucination.  Patient did not start Geodon due to the fear of antipsychotic medication.  She still has some paranoia and hallucination but they are less intense and less frequent.  Patient went to [place] and visited [address] with the family and her parents.  Patient admitted it was tense due to the crowd and husband being around.  There were no incident happened however she is glad that she is back.  She is taking nortriptyline 75 mg.  She denies any side effects of medication however she continued to feel that she has some side effects of nortriptyline.  Patient did not provide details and like to get another level of nortriptyline.  She had a stop watching TV and that has helped her paranoia.  She is seeing therapist but recently has not able to make appointment due to visit to [facility].  Overall she feel less depressed and less anxious with nortriptyline.  She sleeping better.  She denies any agitation anger mood swing however she continued to endorse some paranoia.  She still believes sometime conspiracy theory from government.  She's not drinking or using any illegal substance.    Current psychiatric medication Nortriptyline 75 mg at bedtime  Past psychiatric history Patient has significant history of depression.  In the past she had tried Abilify, Risperdal, Cymbalta, Prozac, Lamictal, Wellbutrin, lithium, BuSpar and ECT.  Patient has multiple psychiatric inpatient treatment.  She has done intensive outpatient program few times.  Medical history Patient has history of polycystic ovary disease, obesity and chronic pain.  Recently she has hemoglobin A1c which was 5.2.  Patient recently seen cardiologist and waiting how to monitor as patient has sought 3 weeks ago.  Patient is scheduled to  see cardiologist again in on May 17.  Alcohol and substance use history Patient denies any history of alcohol or substance use  Mental status examination Patient is casually dressed and fairly groomed. She is anxious guarded and maintained fair eye contact.  She described her mood anxious and nervous and her affect is constricted.  She is relevant in conversation.  Her speech is slow but coherent.  Her thought processes logical linear and goal-directed.  She denies any active or passive suicidal thoughts or homicidal thoughts.  She endorse paranoid feelings about people and surrounding however she denies any auditory or visual hallucination.  Her attention and concentration is fair. Her thought process also slow. There were no extrapyramidal side effects or shakes present at this time. She's alert oriented x3. Her insight judgment and pulse control is okay.  Assessment Axis I Maj. depressive disorder with psychotic features Axis II deferred Axis III see medical history Axis IV moderate  Plan I will continue nortriptyline 75 mg.  Patient wants to keep Geodon prescription she has not used but promised if her paranoia get worse she will start.  She liked to get nortriptyline level.  I will order a nortriptyline level.  I recommend to call us if she is any question or concern about the medication or if she feels worsening of the symptoms.  We talk about safety plan that anytime if she having suicidal thoughts or homicidal thoughts and she need to call 911 or go to local emergency room.  I will see her again in 4 weeks.  Portion of this note is generated with  voice recognition software and may contain typographical error.

## 2011-10-23 ENCOUNTER — Ambulatory Visit (INDEPENDENT_AMBULATORY_CARE_PROVIDER_SITE_OTHER): Payer: 59 | Admitting: Psychiatry

## 2011-10-23 DIAGNOSIS — F419 Anxiety disorder, unspecified: Secondary | ICD-10-CM

## 2011-10-23 DIAGNOSIS — F411 Generalized anxiety disorder: Secondary | ICD-10-CM

## 2011-10-23 LAB — NORTRIPTYLINE LEVEL: Nortriptyline Lvl: 166 mcg/L — ABNORMAL HIGH (ref 50–150)

## 2011-10-23 NOTE — Progress Notes (Signed)
   THERAPIST PROGRESS NOTE  Session Time: 3:00-3:50 pm  Participation Level: Active  Behavioral Response: Casual, Neat and Well GroomedAlertAnxious  Type of Therapy: Individual Therapy  Treatment Goals addressed: Anxiety  Interventions: Motivational Interviewing, Solution Focused and Reframing  Summary: Katelyn [name] is a 36 y.o. female who presents with anxious mood and affect. She just came from a hair appointment/makeover and was almost unrecognizable to Clinical research associate. She reports feeling "uncomfortable" with the new style since she has not changed her hair since high school. Her friend [name] brought patient to the appointment and has been making most decisions for patient including her hairstyle, clothing choices, food choices and various other decisions. Patient states she is fine with being "told what to do" but admits that sometimes it increases her anxiety level. She reports no depression and high anxiety and expressed fear that if she tries to reduce her anxiety level her depression will return and she can at least function in society with her anxiety and does not want to lose that. She expressed concern about "being overly focused" on meeting her 100 pound weight loss goal and has two weeks to loose nine pounds. She states she is doing everything in her power to lose the weight including throwing up. She states she won't do anything that puts her body at risk but is aware she is obsessed and engaging in compulsive behaviors to meet the goal. She states she still is not taking the Geodon. She worries about side affects.   Suicidal/Homicidal: Nowithout intent/plan  Therapist Response: Used motivation interviewing to gain insight into when to intervene with obsessive weight control. Identified situations would cause patient to be concerned and had patient write a letter to self to help her identify a need for change. Explored how she handled the trip to [employer] and assessed overall  level of functioning. Still has some paranoid thinking.  Plan: Return again in 1 week. Continue assessing paranoia and explore OCD behaviors relating to weight loss.   Diagnosis: Axis I: Anxiety Disorder NOS and Major Depression, Recurrent severe    Axis II: None    Pamala Hayman E, LCSW 10/23/2011

## 2011-11-05 ENCOUNTER — Encounter (HOSPITAL_COMMUNITY): Payer: Self-pay | Admitting: Psychiatry

## 2011-11-05 ENCOUNTER — Ambulatory Visit (INDEPENDENT_AMBULATORY_CARE_PROVIDER_SITE_OTHER): Payer: 59 | Admitting: Psychiatry

## 2011-11-05 DIAGNOSIS — F329 Major depressive disorder, single episode, unspecified: Secondary | ICD-10-CM

## 2011-11-05 DIAGNOSIS — F32A Depression, unspecified: Secondary | ICD-10-CM

## 2011-11-05 DIAGNOSIS — F411 Generalized anxiety disorder: Secondary | ICD-10-CM

## 2011-11-05 DIAGNOSIS — F419 Anxiety disorder, unspecified: Secondary | ICD-10-CM

## 2011-11-05 DIAGNOSIS — F3289 Other specified depressive episodes: Secondary | ICD-10-CM
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
## 2011-11-08 ENCOUNTER — Emergency Department (HOSPITAL_COMMUNITY)
Admission: EM | Admit: 2011-11-08 | Discharge: 2011-11-08 | Disposition: A | Discharge status: Home / Self Care | Payer: 59 | Attending: Emergency Medicine | Admitting: Emergency Medicine

## 2011-11-08 ENCOUNTER — Encounter (HOSPITAL_COMMUNITY): Payer: Self-pay | Admitting: *Deleted

## 2011-11-08 DIAGNOSIS — G4730 Sleep apnea, unspecified: Secondary | ICD-10-CM | POA: Insufficient documentation

## 2011-11-08 DIAGNOSIS — Z87891 Personal history of nicotine dependence: Secondary | ICD-10-CM | POA: Insufficient documentation

## 2011-11-08 DIAGNOSIS — E119 Type 2 diabetes mellitus without complications: Secondary | ICD-10-CM | POA: Insufficient documentation

## 2011-11-08 DIAGNOSIS — E669 Obesity, unspecified: Secondary | ICD-10-CM | POA: Insufficient documentation

## 2011-11-08 DIAGNOSIS — N390 Urinary tract infection, site not specified: Secondary | ICD-10-CM

## 2011-11-08 HISTORY — DX: Polycystic ovarian syndrome: E28.2

## 2011-11-08 LAB — URINALYSIS, ROUTINE W REFLEX MICROSCOPIC
Nitrite: POSITIVE — AB
Specific Gravity, Urine: 1.016 (ref 1.005–1.030)
Urobilinogen, UA: 1 mg/dL (ref 0.0–1.0)
pH: 6 (ref 5.0–8.0)

## 2011-11-08 LAB — URINE MICROSCOPIC-ADD ON

## 2011-11-08 LAB — POCT PREGNANCY, URINE: Preg Test, Ur: NEGATIVE

## 2011-11-08 MED ORDER — SULFAMETHOXAZOLE-TMP DS 800-160 MG PO TABS
1.0000 | ORAL_TABLET | Freq: Once | ORAL | Status: AC
  Administered 2011-11-08: 1 via ORAL
  Filled 2011-11-08: qty 1

## 2011-11-08 MED ORDER — OXYCODONE-ACETAMINOPHEN 5-325 MG PO TABS
2.0000 | ORAL_TABLET | Freq: Once | ORAL | Status: AC
  Administered 2011-11-08: 2 via ORAL
  Filled 2011-11-08: qty 2

## 2011-11-08 MED ORDER — OXYCODONE-ACETAMINOPHEN 5-325 MG PO TABS: 1.0000 | ORAL_TABLET | ORAL | Status: AC | PRN

## 2011-11-08 MED ORDER — SULFAMETHOXAZOLE-TRIMETHOPRIM 800-160 MG PO TABS: ORAL_TABLET | ORAL | Status: DC

## 2011-11-08 MED ORDER — PHENAZOPYRIDINE HCL 200 MG PO TABS: 200.0000 mg | ORAL_TABLET | Freq: Three times a day (TID) | ORAL | Status: AC

## 2011-11-08 NOTE — ED Provider Notes (Signed)
History     CSN: [id number]  Arrival date & time 11/08/11  0403   First MD Initiated Contact with Patient 11/08/11 [phone]      Chief Complaint  Patient presents with  . Abdominal Pain    (Consider location/radiation/quality/duration/timing/severity/associated sxs/prior treatment) HPI Comments: Katelyn [name] 36 y.o. female   The chief complaint is: Patient presents with:   Abdominal Pain   The patient has medical history significant for:   Past Medical History:   Obesity                                                      Skin lesion                                                    Comment:Excisional biopsy   Depression                                                     Comment:several suicide attempts, hospitaluzed in 2012               for this   Diabetes mellitus type II                                    Dizziness - light-headed                                     Sleep apnea                                                    Comment:Mild; moderate periodic limb movement disorder   Polycystic ovary                                            Patient presents with left sided flank pain and dysuria that began yesterday afternoon. Patient states that the flank pain is 9/10, sharp, constant, with associated disaphoresis and not relieved by Tylenol or Ibuprofen. The dysuria has associated urgency and frequency. Patient states that she has had multiple UTI's in the past, but never with the flank pain. Denies fever, chills. Denies NVD. Denies vaginal discharge, sores, or pain. LMP: 10/04/11 and husband has had vasectomy.     Patient is a 36 y.o. female presenting with abdominal pain.  Abdominal Pain The primary symptoms of the illness include abdominal pain and dysuria. The primary symptoms of the illness do not include fever, nausea, vomiting, diarrhea or vaginal discharge.  The dysuria is associated with urgency. The dysuria is not associated with hematuria.    Additional symptoms associated with the illness include urgency.  Symptoms associated with the illness do not include chills, constipation or hematuria.    Past Medical History  Diagnosis Date  . Obesity   . Skin lesion     Excisional biopsy  . Depression     several suicide attempts, hospitaluzed in 2012 for this  . Diabetes mellitus type II   . Dizziness - light-headed   . Sleep apnea     Mild; moderate periodic limb movement disorder  . Polycystic ovary     Past Surgical History  Procedure Date  . Tonsillectomy   . Cystoscopy with urethral dilatation age 80   . 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 Skin lesion excision   . Cesarean section     Family History  Problem Relation Age of Onset  . Hypertension Mother     Iterstitial Cystist  . Depression Brother   . Colon polyps Father   . Depression Father     History  Substance Use Topics  . Smoking status: Former Smoker    Quit date: 08/26/1996  . Smokeless tobacco: Not on file  . Alcohol Use: No    OB History    Grav Para Term Preterm Abortions TAB SAB Ect Mult Living                  Review of Systems  Constitutional: Negative for fever and chills.  Gastrointestinal: Positive for abdominal pain. Negative for nausea, vomiting, diarrhea and constipation.  Genitourinary: Positive for dysuria, urgency and flank pain. Negative for hematuria, vaginal discharge, genital sores and pelvic pain.    Allergies  Lithium and Penicillins  Home Medications   Current Outpatient Rx  Name Route Sig Dispense Refill  . ACETAMINOPHEN 325 MG PO TABS Oral Take 650 mg by mouth every 6 (six) hours as needed.    Marland Kitchen METFORMIN HCL 500 MG PO TABS Oral Take 500 mg by mouth 2 (two) times daily with a meal.      . NORTRIPTYLINE HCL 75 MG PO CAPS Oral Take 1 capsule (75 mg total) by mouth at bedtime. 30 capsule 0    BP 135/76  Pulse 121  Temp 99 F (37.2 C) (Oral)  Resp 20  SpO2 97%  Physical Exam   Vitals reviewed. Constitutional: She appears well-developed and well-nourished.  HENT:  Head: Normocephalic and atraumatic.  Cardiovascular: Normal rate, regular rhythm, normal heart sounds and intact distal pulses.   Abdominal: Soft. Bowel sounds are normal. There is no tenderness. There is CVA tenderness.  Neurological: She is alert.    ED Course  Procedures (including critical care time)   Results for orders placed during the hospital encounter of 11/08/11  URINALYSIS, ROUTINE W REFLEX MICROSCOPIC      Component Value Range   Color, Urine ORANGE (*) YELLOW   APPearance TURBID (*) CLEAR   Specific Gravity, Urine 1.016  1.005 - 1.030   pH 6.0  5.0 - 8.0   Glucose, UA NEGATIVE  NEGATIVE mg/dL   Hgb urine dipstick LARGE (*) NEGATIVE   Bilirubin Urine NEGATIVE  NEGATIVE   Ketones, ur NEGATIVE  NEGATIVE mg/dL   Protein, ur >161 (*) NEGATIVE mg/dL   Urobilinogen, UA 1.0  0.0 - 1.0 mg/dL   Nitrite POSITIVE (*) NEGATIVE   Leukocytes, UA LARGE (*) NEGATIVE  POCT PREGNANCY, URINE      Component Value Range   Preg Test, Ur NEGATIVE  NEGATIVE  URINE MICROSCOPIC-ADD ON      Component Value  Range   Squamous Epithelial / LPF RARE  RARE   WBC, UA TOO NUMEROUS TO COUNT  <3 WBC/hpf   RBC / HPF 7-10  <3 RBC/hpf   Bacteria, UA MANY (*) RARE    No results found.   1. UTI (lower urinary tract infection)       MDM  Patient is a 36 y/o female who presented with left flank pain and dysuria. No vaginal or abdominal complaints. Patient stated flank pain was 9/10 and managed in ED with Percocet. Urine pregnancy test negative. UA positive for nitrite, blood, and leukocytes. Patient given first dose of Bactrim-Septra in ED. Patient discharged on Bactrim, Pyridium, and Percocet for pain management. Patient has no red flags for pyelonephritis or kidney stone. Plan discussed with Dr. Hyacinth Meeker.        Pixie Casino, PA-C 11/08/11 [phone]

## 2011-11-08 NOTE — ED Notes (Signed)
36 y/o female with a history of left-sided abdominal pain, is constant, sharp and stabbing, associated with diaphoresis and dysuria X 24 hours, hx of vesicoureteral reflux which was fixed surgically as a child.  Still gets 2X / year UTI's.  To exam: Patient well appearing, in no acute distress, abdomen is soft and non-peritoneal but does have suprapubic and left lower quadrant tenderness. No pain at McBurney's point, no pain in right upper cautery. No fevers pulse 110 on my exam, no rashes, no peripheral edema, clear lungs, normal peripheral pulses., Mild CVA tenderness on the left   R/o UTI, consider other sources if neg UA.    Medical screening examination/treatment/procedure(s) were conducted as a shared visit with non-physician practitioner(s) and myself.  I personally evaluated the patient during the encounter    Vida Roller, MD 11/08/11 [phone]

## 2011-11-08 NOTE — ED Notes (Addendum)
Pt states that she has been having left flank pain. Pt states that she has been having burning when she urinates. Pt states hx of kidney infections but not stones. Pt denies problems with bowel movements. Pt states use of OTC medication that turns pee orange.

## 2011-11-08 NOTE — ED Provider Notes (Signed)
Medical screening examination/treatment/procedure(s) were conducted as a shared visit with non-physician practitioner(s) and myself.  I personally evaluated the patient during the encounter  Please see my separate respective documentation pertaining to this patient encounter   Vida Roller, MD 11/08/11 [phone]

## 2011-11-10 ENCOUNTER — Other Ambulatory Visit: Payer: Self-pay

## 2011-11-10 ENCOUNTER — Telehealth: Payer: Self-pay | Admitting: Family Medicine

## 2011-11-10 ENCOUNTER — Other Ambulatory Visit: Payer: Self-pay | Admitting: Family Medicine

## 2011-11-10 DIAGNOSIS — R300 Dysuria: Secondary | ICD-10-CM

## 2011-11-10 MED ORDER — CIPROFLOXACIN HCL 500 MG PO TABS: 500.0000 mg | ORAL_TABLET | Freq: Two times a day (BID) | ORAL | Status: AC

## 2011-11-10 NOTE — Telephone Encounter (Signed)
pls advise her to submit urine for c/s and order  None seemed to have been sent from [facility], verify pls.  Then send in cipro 500mg  one twice daily #10 Tell her to enjoy her trip!

## 2011-11-10 NOTE — Telephone Encounter (Signed)
Pt aware and med sent to [facility].  Lab faxed over for collection.

## 2011-11-11 NOTE — Telephone Encounter (Signed)
Order refaxed

## 2011-11-20 ENCOUNTER — Ambulatory Visit (INDEPENDENT_AMBULATORY_CARE_PROVIDER_SITE_OTHER): Payer: 59 | Admitting: Psychiatry

## 2011-11-20 ENCOUNTER — Encounter (HOSPITAL_COMMUNITY): Payer: Self-pay | Admitting: Psychiatry

## 2011-11-20 DIAGNOSIS — F3341 Major depressive disorder, recurrent, in partial remission: Secondary | ICD-10-CM

## 2011-11-20 DIAGNOSIS — IMO0002 Reserved for concepts with insufficient information to code with codable children: Secondary | ICD-10-CM
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
## 2011-11-25 ENCOUNTER — Encounter (HOSPITAL_COMMUNITY): Payer: Self-pay | Admitting: Psychiatry

## 2011-11-25 ENCOUNTER — Ambulatory Visit (INDEPENDENT_AMBULATORY_CARE_PROVIDER_SITE_OTHER): Payer: 59 | Admitting: Psychiatry

## 2011-11-25 DIAGNOSIS — F323 Major depressive disorder, single episode, severe with psychotic features: Secondary | ICD-10-CM

## 2011-11-25 DIAGNOSIS — F329 Major depressive disorder, single episode, unspecified: Secondary | ICD-10-CM

## 2011-11-25 MED ORDER — NORTRIPTYLINE HCL 75 MG PO CAPS: 75.0000 mg | ORAL_CAPSULE | Freq: Every day | ORAL | Status: DC
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
## 2011-11-27 ENCOUNTER — Ambulatory Visit (HOSPITAL_COMMUNITY): Payer: Self-pay | Admitting: Psychiatry

## 2011-12-02 ENCOUNTER — Ambulatory Visit: Payer: Self-pay | Admitting: Family Medicine

## 2011-12-04 ENCOUNTER — Encounter (HOSPITAL_COMMUNITY): Payer: Self-pay | Admitting: Psychiatry

## 2011-12-04 ENCOUNTER — Ambulatory Visit (INDEPENDENT_AMBULATORY_CARE_PROVIDER_SITE_OTHER): Payer: 59 | Admitting: Psychiatry

## 2011-12-04 ENCOUNTER — Telehealth (HOSPITAL_COMMUNITY): Payer: Self-pay | Admitting: *Deleted

## 2011-12-04 DIAGNOSIS — F332 Major depressive disorder, recurrent severe without psychotic features: Secondary | ICD-10-CM

## 2011-12-04 NOTE — Progress Notes (Signed)
   THERAPIST PROGRESS NOTE  Session Time: 2:00-2:50 pm  Participation Level: Active  Behavioral Response: CasualAlertAnxious and Depressed  Type of Therapy: Individual Therapy  Treatment Goals addressed: Anxiety and Diagnosis: Depression  Interventions: CBT, Motivational Interviewing and Supportive  Summary: Katelyn [name] is a 36 y.o. female who presents with depressed and anxious mood and affect. Patient described an increase in her depressive symptoms but did not correlate them to the fact that her depression was returning. She described meeting the criteria for 8/9 depression symptoms and states she is having thoughts of suicide without a plan. She originally was focused on her obsession with not being able to lose weight and states she is eating in extremes, either not eating or overeating due to her increased lack of energy and motivation to follow her healthy eating plan. She also states she is still purging occasionally. She described her fear of increasing her Noratryptiline to 100 mg per Dr. Robert Bellow recommendation, but after exploring a need to intervene and treat the depression and in looking at what has worked for her in the past, she agreed to fill the prescription. She contracted for safety and agreed to enact her crisis plan if her depression or suicidal thoughts worsen.   Suicidal/Homicidal: Yeswithout intent/plan  Therapist Response: Assessed for safety, created safety plan, Discussed need to intervene with depression, contacted Dr. Robert Bellow office to get a 25 mg prescription called into her pharmacy for Noratrypliline   Plan: Return again in 1 weeks.  Diagnosis: Axis I: Major Depression, Recurrent severe    Axis II: None    Adanely Reynoso E, LCSW 12/04/2011

## 2011-12-08 NOTE — Telephone Encounter (Signed)
Spoke with Katelyn [name] 12/08/11 1455: Per [name], pt saw her 12/04/11. Patient told her she has been on Nortriptyline 100 mg in the past and would like to be on that dosage again.She is currently on 75 mg. If approved by Dr.Arfeen, patient needs 25 mg capsules called to Pharmacy--she will take with 75 mg capsules to equal dose of 100 mg.

## 2011-12-09 ENCOUNTER — Other Ambulatory Visit (HOSPITAL_COMMUNITY): Payer: Self-pay | Admitting: Psychiatry

## 2011-12-09 MED ORDER — NORTRIPTYLINE HCL 25 MG PO CAPS: 25.0000 mg | ORAL_CAPSULE | Freq: Every day | ORAL | Status: DC

## 2011-12-09 NOTE — Telephone Encounter (Signed)
Patient was to try Pamelor 100 mg.  She has 75 mg remaining.  We will order 25 mg so that she can try 100 mg at bedtime.

## 2011-12-11 ENCOUNTER — Ambulatory Visit (HOSPITAL_COMMUNITY): Payer: Self-pay | Admitting: Psychiatry

## 2011-12-18 ENCOUNTER — Encounter (HOSPITAL_COMMUNITY): Payer: Self-pay | Admitting: Psychiatry

## 2011-12-18 ENCOUNTER — Ambulatory Visit (INDEPENDENT_AMBULATORY_CARE_PROVIDER_SITE_OTHER): Payer: 59 | Admitting: Psychiatry

## 2011-12-18 DIAGNOSIS — F332 Major depressive disorder, recurrent severe without psychotic features: Secondary | ICD-10-CM

## 2011-12-18 DIAGNOSIS — F411 Generalized anxiety disorder: Secondary | ICD-10-CM

## 2011-12-18 DIAGNOSIS — F419 Anxiety disorder, unspecified: Secondary | ICD-10-CM

## 2011-12-18 DIAGNOSIS — F509 Eating disorder, unspecified: Secondary | ICD-10-CM

## 2011-12-18 NOTE — Progress Notes (Signed)
   THERAPIST PROGRESS NOTE  Session Time: 3:00-3:50 pm  Participation Level: Active  Behavioral Response: CasualAlertAnxious and Depressed  Type of Therapy: Individual Therapy  Treatment Goals addressed: Anxiety and Diagnosis: Deoression and Eating Disorder  Interventions: Motivational Interviewing, Solution Focused and Supportive  Summary: FRANCELY CRAW is a 36 y.o. female who presents with depressed and anxious mood and affect. She reports her depression at a 7 and her anxiety a 10 on the scale from 1-10 with 10 being high. She states her main struggle is her "obsessive" behavior and thoughts regarding food. She states she is binging, purging and denying herself food in a frantic attempt to lose weight and states she has started to weigh her children and monitor their food to avoid weight gain. She agreed that was dangerous and stated she is no longer doing that and allowing her husband to handle their meals. She states she struggles with her need to control things in the home and requires a strict routine that is impacting her family. She states she is considering going back into the hospital but does not feel ready at this time. She is not suicidal. She agreed to have Clinical research associate explore a referral to an eating disorder specialist since patients symptoms and behaviors are outside of writers scope of practice.    Suicidal/Homicidal: Nowithout intent/plan  Therapist Response: Assessed obsessive behaviors, Childrens safety in the home, level of depression and anxiety and discussed the need to refer patient out to an eating disorder specialist.   Plan: Return again in 1 week. Refer to specialist and explore need for inpatient hospitalization.   Diagnosis: Axis I: Major Depression, Recurrent severe and Eating Disorder, Anxiety    Axis II: None    Yusuke Beza E, LCSW 12/18/2011

## 2011-12-23 ENCOUNTER — Encounter (HOSPITAL_COMMUNITY): Payer: Self-pay | Admitting: Psychiatry

## 2011-12-23 ENCOUNTER — Ambulatory Visit (INDEPENDENT_AMBULATORY_CARE_PROVIDER_SITE_OTHER): Payer: 59 | Admitting: Psychiatry

## 2011-12-23 DIAGNOSIS — F332 Major depressive disorder, recurrent severe without psychotic features: Secondary | ICD-10-CM

## 2011-12-23 NOTE — Progress Notes (Signed)
   THERAPIST PROGRESS NOTE  Session Time: 3:00-3:50 pm  Participation Level: Active  Behavioral Response: Casual, Neat and Well GroomedAlertAnxious and Depressed  Type of Therapy: Individual Therapy  Treatment Goals addressed: Coping  Interventions: Motivational Interviewing, Solution Focused and Supportive  Summary: Katelyn [name] is a 36 y.o. female who presents with depressed mood and affect with high anxiety. Patient reports no progress with depression, anxiety or eating disorder symptoms since last session last week. Discussed referrals to an eating disorder specialist and gave patient names and phone numbers of specialists in the area to call who focus on eating disorders. Patient discussed her continued conflicted relationship with her friend [name] and states she is now "fearful" in this relationship and her friend is using guilt, verbal belittling and power and control to force patient comply. Patient expressed a desire to challenge her friend but her need to please [name] and fear of the consequences her friend may enforce scare patient and cause her to avoid taking action. Patient became overwhelmed with talking about this situation and requested to change topics.  Writer stated she explored going into the hospital to treat  Her depression, but her family expressed their displeasure with this decision and told patient it was "bad timing". Patient denied suicidal thoughts or plans, but states she feels overwhelmed. She contracted for safety and agreed to enact her crisis plan if needed.   Suicidal/Homicidal: Nowithout intent/plan  Therapist Response: Writer explored patients feeling of "overwhelmed" and discussed how patient does not seem to be making progress in treatment. Writer discussed feeling like sessions are not helping and stated she feels patient needs a higher level of care to address deep rooted personal issues associated with low self-esteem and lack of self identity that  keep her involved in unhealthy relationships with others that contribute to depressed mood. Writer suggested a longer term treatment center where patient could explore more intently these issues that individual therapy is not addressing.   Plan: Return again in 2 weeks. Revisit patients decision to go into a longer term treatment center and her follow up with contacting an eating disorder specialist.   Diagnosis: Axis I: Major Depression, Recurrent severe    Axis II: Cluster B Traits    Kataleya Zaugg E, LCSW 12/23/2011

## 2011-12-25 ENCOUNTER — Ambulatory Visit (HOSPITAL_COMMUNITY): Payer: Self-pay | Admitting: Psychiatry

## 2011-12-30 ENCOUNTER — Ambulatory Visit (INDEPENDENT_AMBULATORY_CARE_PROVIDER_SITE_OTHER): Payer: 59 | Admitting: Psychiatry

## 2011-12-30 ENCOUNTER — Encounter (HOSPITAL_COMMUNITY): Payer: Self-pay | Admitting: Psychiatry

## 2011-12-30 VITALS — Wt 224.0 lb

## 2011-12-30 DIAGNOSIS — F329 Major depressive disorder, single episode, unspecified: Secondary | ICD-10-CM

## 2011-12-30 DIAGNOSIS — F323 Major depressive disorder, single episode, severe with psychotic features: Secondary | ICD-10-CM

## 2011-12-30 MED ORDER — TRAZODONE HCL 100 MG PO TABS: 100.0000 mg | ORAL_TABLET | Freq: Every day | ORAL | Status: AC

## 2011-12-30 MED ORDER — NORTRIPTYLINE HCL 50 MG PO CAPS: ORAL_CAPSULE | ORAL | Status: DC

## 2011-12-30 NOTE — Progress Notes (Signed)
Chief complaint I am feeling more depressed and anxious.  My relationship with the neighbor is tense.  I'm not sleeping good.  I am binging and then not eating very well.  History presenting illness Patient is 36 year old female who came for her followup appointment.  Patient endorse increased anxiety and depression in the past few weeks.  She has recently seen her therapist who also recommend to seek treatment for her binging.  Patient decided to take nortriptyline 100 mg recently when she endorse that she is not sleeping very well.  Patient and her neighbor having issues .  Her neighbor does not want patient to take medication and since patient started to take medication relationship is not going very well.  Patient is very frustrated because she depends on her neighbor a lot.  She feels that she will be left alone .  Patient admitted some time does not eat very well to loose weight and then she binge .  She liked taking nortriptyline but also concern about future.  She admitted having paranoia , hallucination and issue trusting other people.  Her family issue still remain the same.  Her relationship with the husband has not improved however she has a good support from her parent's.  Her 83-year-old is scheduled to see his spent addition due to chronic pain which patient believed it could be growing pain .  She admitted having crying spells social isolation and being withdrawn.  She also admitted passive suicidal thinking but denies any active suicidal thinking or plan.  She wants to get better.  She is willing to take antipsychotic medication which was prescribed in the past but patient never took it as per advice from her neighbor.  She is any side effects of medication.  Her weight is unchanged from the past.  She denies any tremors shakes .  She's not drinking or using any illegal substance.  She admitted some time takes trazodone 150 mg for sleep.  She has Geodon 40 mg at home which she wants to  try.  Current psychiatric medication Nortriptyline 100 mg at bedtime Trazodone 50 mg to 150 mg as needed for insomnia her  Past psychiatric history Patient has significant history of depression.  In the past she had tried Abilify, Risperdal, Cymbalta, Prozac, Lamictal, Wellbutrin, lithium, BuSpar and ECT.  Patient has multiple psychiatric inpatient treatment.  She has done intensive outpatient program few times.  Medical history Patient has history of polycystic ovary disease, obesity and chronic pain.  Hemoglobin A1c was 5.2.    Alcohol and substance use history Patient denies any history of alcohol or substance use  Mental status examination Patient is casually dressed and fairly groomed. She is anxious and maintained fair eye contact.  She is tearful and described her mood is depressed and anxious and her affect is constricted.  She endorse paranoia and does not feel comfortable around people.  She denies any auditory or visual hallucination.  She denies any active suicidal thoughts or plan.  Her speech is slow but clear and coherent.  Her thought process is also slow but logical linear and goal-directed.  She denies any homicidal thoughts.  Her attention and concentration is fair. Her thought process also slow. There were no extrapyramidal side effects or shakes present at this time. She's alert oriented x3. Her insight judgment and pulse control is okay.  Assessment Axis I Maj. depressive disorder with psychotic features Axis II deferred Axis III see medical history Axis IV moderate  Plan I  discussed in detail restarting antipsychotic medication.  She had a good response with Risperdal and Abilify however she stopped taking medication due to side effects and expense .  She like to try Geodon 40 mg.  She has medication at home.  I recommend to take trazodone 50-100 mg if she sleeping better with the Geodon.  I also recommend to see therapist for coping and social skills.  We talk about  binge eating and if it continued to persist then we may need to refer for her eating disorder.  We talked about relationship with the neighbor which I believe is countertherapeutic.  Patient acknowledged and agreed to keep a distance from her neighbor who is influencing her treatment plan.  He talked about safety plan that anytime if she feels worsening of the symptom any suicidal thoughts or homicidal thoughts then she need to call 911 or go to local emergency room.  I will see her again in 3 weeks.  Time spent 30 minutes.  She will continue nortriptyline 100 mg at this time.  Portion of this note is generated with voice recognition software and may contain typographical error.

## 2012-01-08 ENCOUNTER — Ambulatory Visit (INDEPENDENT_AMBULATORY_CARE_PROVIDER_SITE_OTHER): Payer: 59 | Admitting: Psychiatry

## 2012-01-08 ENCOUNTER — Encounter (HOSPITAL_COMMUNITY): Payer: Self-pay | Admitting: Psychiatry

## 2012-01-08 DIAGNOSIS — F333 Major depressive disorder, recurrent, severe with psychotic symptoms: Secondary | ICD-10-CM

## 2012-01-08 DIAGNOSIS — F419 Anxiety disorder, unspecified: Secondary | ICD-10-CM

## 2012-01-08 NOTE — Progress Notes (Signed)
   THERAPIST PROGRESS NOTE  Session Time: 1:00-1:50 pm  Participation Level: Active  Behavioral Response: Just came from the gymAlertAnxious  Type of Therapy: Individual Therapy  Treatment Goals addressed: Coping  Interventions: Solution Focused, Strength-based and Supportive  Summary: Katelyn [name] is a 36 y.o. female who presents with anxious mood and affect. She appears less depressed than previous session with increased ability to focus, concentrate and communicate. She shared several stressors including; 1) conflict she had with her friend [name] that caused discomfort where [name] set limits regarding their exercise routine. 2) Paranoia regarding taking the Geodon that was prescribed by Dr. Lolly Mustache and 3) Concerns regarding the last counseling session with writer. Patient problem solved all three issues during the session. At first she expressed that the Geodon was not helping and listed concerns about medications have side affects when taken together. She is aware she still has a lot of mistrust and paranoia but knows that is skewed thinking now. She was able to identify how the medication is helping her concentrate, think more clearly and be more assertive.  Patient shared concerns she had about the last counseling session due to writer suggesting she see a specialist in eating disorders. Patient said she felt writer was wanting to end sessions and patient appeared to panic and fear abandonment. She stated she was not able to contact writer in between sessions for fearing she would be an inconvenience. Patient reports feeling like she is making progress in the sessions and finds the ability to talk and problem solve most helpful. She states her depression has reduced but her anxiety symptoms have increased.   Suicidal/Homicidal: Nowithout intent/plan  Therapist Response: used problem solving to help patient explore stressors. Discussed last session and clarified miscommunication.  Challenged paranoid thinking. Encouraged taking medications.   Plan: Return again in 1 weeks. Provide supportive counseling and problem solving.   Diagnosis: Axis I: Anxiety Disorder NOS and Major Depressive Disorder, Recurrent, Severe with psychosis    Axis II: Cluster B traits     Vibha Ferdig E, LCSW 01/08/2012

## 2012-01-13 ENCOUNTER — Ambulatory Visit (INDEPENDENT_AMBULATORY_CARE_PROVIDER_SITE_OTHER): Payer: 59 | Admitting: Psychiatry

## 2012-01-13 ENCOUNTER — Encounter (HOSPITAL_COMMUNITY): Payer: Self-pay | Admitting: Psychiatry

## 2012-01-13 VITALS — Wt 221.0 lb

## 2012-01-13 DIAGNOSIS — F322 Major depressive disorder, single episode, severe without psychotic features: Secondary | ICD-10-CM

## 2012-01-13 DIAGNOSIS — F329 Major depressive disorder, single episode, unspecified: Secondary | ICD-10-CM

## 2012-01-13 MED ORDER — ZIPRASIDONE HCL 60 MG PO CAPS: 60.0000 mg | ORAL_CAPSULE | Freq: Every day | ORAL | Status: DC

## 2012-01-13 MED ORDER — NORTRIPTYLINE HCL 50 MG PO CAPS: ORAL_CAPSULE | ORAL | Status: DC

## 2012-01-13 NOTE — Progress Notes (Signed)
Chief complaint I like Geodon.  I feel better.  History presenting illness Patient is 36 year old female who came for her followup appointment.  She brought list of issues that she need to be discussed on this visit.  On her last appointment I started her on Geodon to target the paranoia and hallucination.  Patient seems some improvement in her paranoid thinking.  However she still has trust issue undetermined agencies .  At night sometimes she feels the TV is on and when she comes out to check she find TV is off.  She also here doorbell ring but no one of the door.  However overall she feel more calmer less depressed and less paranoid.  She feel some time decreased concentration the Geodon.  However she has taken those time with trazodone.  She still has insomnia some nights.  She is trying to avoid taking trazodone  Overall.she denies any agitation anger mood swing.  She denies any crying spells.  She continues to feel some time very anxious however recently started going to [facility] but she has never done before.  I relationship of the neighbor remain the same.  She has not told her about taking medication and she afraid that she may lose her as a friend.  Her family issue remains the same but her parents are very supportive. She denies any side effects of medication.  She has lost weight 3 point from her last visit.  She denies any tremors shakes .  She's not drinking or using any illegal substance.    Current psychiatric medication Nortriptyline 100 mg at bedtime Trazodone 50 mg to 150 mg as needed for insomnia Geodon 40 mg at bedtime  Past psychiatric history Patient has significant history of depression.  In the past she had tried Abilify, Risperdal, Cymbalta, Prozac, Lamictal, Wellbutrin, lithium, BuSpar and ECT.  Patient has multiple psychiatric inpatient treatment.  She has done intensive outpatient program few times.  Medical history Patient has history of polycystic ovary disease, obesity and  chronic pain.  Hemoglobin A1c was 5.2.    Alcohol and substance use history Patient denies any history of alcohol or substance use  Social history She lives with her husband and her children.  Her parents are very supportive and live close by.  Mental status examination Patient is casually dressed and fairly groomed. She is anxious but cooperative.  She described her mood is better and her affect is improved from the past.  She maintained good eye contact.  Her speech is clear and coherent and her thought process is logical linear and goal-directed. She still endorse paranoia and not comfortable around people but she denies any auditory or visual hallucination at this time.  She denies any active or passive suicidal thoughts or homicidal thoughts.  Her fund of knowledge is adequate.  Her attention and concentration is better.  She's alert and oriented x3.  Her insight judgment and impulse control is okay.  Assessment Axis I Maj. depressive disorder with psychotic features Axis II deferred Axis III see medical history Axis IV moderate  Plan I review last progress note, vitals and response to the medication.  I do believe patient shown some improvement since Geodon started.  Despite concern about weight gain she actually lost weight from her last visit.  I recommend to try Geodon 60 mg to target the physical mood lability paranoia and hallucination.  I recommend to take trazodone only as needed for insomnia.  She will continue nortriptyline at present does.  I recommend to call us if she is any question or concern about the medication if she feels worsening of the symptom.  She will see therapist regularly in [place] office.  I will see her again in 4 weeks.  Time spent 30 minutes.  Portion of this note is generated with voice recognition software and may contain typographical error.

## 2012-01-15 ENCOUNTER — Ambulatory Visit (HOSPITAL_COMMUNITY): Payer: Self-pay | Admitting: Psychiatry

## 2012-01-21 ENCOUNTER — Ambulatory Visit (INDEPENDENT_AMBULATORY_CARE_PROVIDER_SITE_OTHER): Payer: 59 | Admitting: Psychiatry

## 2012-02-05 ENCOUNTER — Inpatient Hospital Stay (HOSPITAL_COMMUNITY)
Admission: RE | Admit: 2012-02-05 | Discharge: 2012-02-09 | DRG: 881 | Disposition: A | Discharge status: Home / Self Care | Payer: 59 | Attending: Psychiatry | Admitting: Psychiatry

## 2012-02-05 ENCOUNTER — Encounter (HOSPITAL_COMMUNITY): Payer: Self-pay | Admitting: *Deleted

## 2012-02-05 ENCOUNTER — Ambulatory Visit (INDEPENDENT_AMBULATORY_CARE_PROVIDER_SITE_OTHER): Payer: 59 | Admitting: Psychiatry

## 2012-02-05 DIAGNOSIS — G4730 Sleep apnea, unspecified: Secondary | ICD-10-CM | POA: Diagnosis present

## 2012-02-05 DIAGNOSIS — F3289 Other specified depressive episodes: Principal | ICD-10-CM | POA: Diagnosis present

## 2012-02-05 DIAGNOSIS — R51 Headache: Secondary | ICD-10-CM | POA: Diagnosis present

## 2012-02-05 DIAGNOSIS — F411 Generalized anxiety disorder: Secondary | ICD-10-CM | POA: Diagnosis present

## 2012-02-05 DIAGNOSIS — E119 Type 2 diabetes mellitus without complications: Secondary | ICD-10-CM | POA: Diagnosis present

## 2012-02-05 DIAGNOSIS — F333 Major depressive disorder, recurrent, severe with psychotic symptoms: Secondary | ICD-10-CM

## 2012-02-05 DIAGNOSIS — E282 Polycystic ovarian syndrome: Secondary | ICD-10-CM | POA: Diagnosis present

## 2012-02-05 DIAGNOSIS — E669 Obesity, unspecified: Secondary | ICD-10-CM | POA: Diagnosis present

## 2012-02-05 DIAGNOSIS — F329 Major depressive disorder, single episode, unspecified: Principal | ICD-10-CM

## 2012-02-05 DIAGNOSIS — Z880 Allergy status to penicillin: Secondary | ICD-10-CM

## 2012-02-05 DIAGNOSIS — R45851 Suicidal ideations: Secondary | ICD-10-CM

## 2012-02-05 DIAGNOSIS — Z888 Allergy status to other drugs, medicaments and biological substances status: Secondary | ICD-10-CM

## 2012-02-05 LAB — URINALYSIS, ROUTINE W REFLEX MICROSCOPIC
Hgb urine dipstick: NEGATIVE
Nitrite: NEGATIVE
Specific Gravity, Urine: 1.005 (ref 1.005–1.030)
Urobilinogen, UA: 0.2 mg/dL (ref 0.0–1.0)
pH: 6 (ref 5.0–8.0)

## 2012-02-05 LAB — CBC
Hemoglobin: 14.2 g/dL (ref 12.0–15.0)
MCH: 31.2 pg (ref 26.0–34.0)
MCV: 90.1 fL (ref 78.0–100.0)
RBC: 4.55 MIL/uL (ref 3.87–5.11)

## 2012-02-05 LAB — GLUCOSE, CAPILLARY: Glucose-Capillary: 90 mg/dL (ref 70–99)

## 2012-02-05 LAB — COMPREHENSIVE METABOLIC PANEL
BUN: 8 mg/dL (ref 6–23)
CO2: 28 mEq/L (ref 19–32)
Calcium: 9.7 mg/dL (ref 8.4–10.5)
Creatinine, Ser: 0.8 mg/dL (ref 0.50–1.10)
GFR calc Af Amer: >90 mL/min (ref >90)
GFR calc non Af Amer: >90 mL/min (ref >90)
Glucose, Bld: 73 mg/dL (ref 70–99)

## 2012-02-05 LAB — RAPID URINE DRUG SCREEN, HOSP PERFORMED
Amphetamines: NOT DETECTED
Cocaine: NOT DETECTED
Opiates: NOT DETECTED

## 2012-02-05 LAB — PREGNANCY, URINE: Preg Test, Ur: NEGATIVE

## 2012-02-05 MED ORDER — NORTRIPTYLINE HCL 25 MG PO CAPS
100.0000 mg | ORAL_CAPSULE | Freq: Every day | ORAL | Status: DC
  Administered 2012-02-05 – 2012-02-08 (×4): 100 mg via ORAL
  Filled 2012-02-05 (×8): qty 4

## 2012-02-05 MED ORDER — MAGNESIUM HYDROXIDE 400 MG/5ML PO SUSP: 30.0000 mL | Freq: Every day | ORAL | Status: DC | PRN

## 2012-02-05 MED ORDER — ZIPRASIDONE HCL 60 MG PO CAPS
60.0000 mg | ORAL_CAPSULE | Freq: Every day | ORAL | Status: DC
  Administered 2012-02-05: 60 mg via ORAL
  Filled 2012-02-05 (×5): qty 1

## 2012-02-05 MED ORDER — ALUM & MAG HYDROXIDE-SIMETH 200-200-20 MG/5ML PO SUSP: 30.0000 mL | ORAL | Status: DC | PRN

## 2012-02-05 MED ORDER — ACETAMINOPHEN 325 MG PO TABS
650.0000 mg | ORAL_TABLET | Freq: Four times a day (QID) | ORAL | Status: DC | PRN
  Administered 2012-02-05: 650 mg via ORAL

## 2012-02-05 MED ORDER — TRAZODONE HCL 100 MG PO TABS
100.0000 mg | ORAL_TABLET | Freq: Every evening | ORAL | Status: DC | PRN
  Administered 2012-02-07: 100 mg via ORAL
  Filled 2012-02-05: qty 1

## 2012-02-05 MED ORDER — METFORMIN HCL ER 500 MG PO TB24
500.0000 mg | ORAL_TABLET | Freq: Two times a day (BID) | ORAL | Status: DC
  Administered 2012-02-06 – 2012-02-09 (×7): 500 mg via ORAL
  Filled 2012-02-05 (×15): qty 1

## 2012-02-05 NOTE — Progress Notes (Signed)
36 year old female patient admitted on voluntary basis, pt presented as a walk-in, during admission process pt spoke about being pt of Dr. Lolly Mustache and how she was started on geodon recently to help with depression and anxiety and it initially was working but now she feels just as depressed and anxious as before. Pt admits to paranoia, auditory hallucinations and suicidal thoughts but able to contract for safety on the unit. Pt endorses not sleeping well and feels that her medications are not working for her. Pt is married with children and plans to discharge back there. Pt denies any substance abuse or nicotine use, pt does have a PCP and said that she was suppose to see her in November. Pt also stated on admission that some of her doctors have said she is diabetic and other say she isn't. Pt does not regularly check blood sugar at home but does know the signs of hypoglycemia and was able to verbalize what to do if her blood sugar is low. Pt was oriented to the unit and safety maintained.
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
## 2012-02-05 NOTE — Progress Notes (Signed)
   THERAPIST PROGRESS NOTE  Session Time: 2:00-3:00 pm  Participation Level: Active  Behavioral Response: Casual, Neat and Well GroomedAlertAnxious and Depressed  Type of Therapy: Individual Therapy  Treatment Goals addressed: Anxiety and Diagnosis: Depression  Interventions: Other: Assessed for inpatient hospitalization  Summary: Katelyn [name] is a 36 y.o. female who presents with severe depressed mood and high anxiety. She states she is hearing voices telling her "you don't have to be here and you know what to do". She had a bag packed and stated she felt she needed to be admitted into the hospital for thoughts of suicide. She was unable to contract for safety and requested writer walk with her to the assessment office.    Suicidal/Homicidal: Darnelle Catalan intent/plan  Therapist Response: Assessed for suicide, contacted assessment department and spoke with Minerva Areola and Samson Frederic to confirm a bed for the patient. Walked patient upstairs and assisted with filling out initial paperwork due to inability to focus and concentrate. Patient met with an intake counselor and they began the inpatient assessment.   Plan: Return again after inpatient stay.  Diagnosis: Axis I: Major Depression, Recurrent severe    Axis II: None    Scout Gumbs E, LCSW 02/05/2012

## 2012-02-05 NOTE — Progress Notes (Signed)
D   Pt has been tearful and anxious   She would not come out of her room until the nurse came to talk with her about treatment    She said she did not feel safe on this hallway  A   Reassured pt of safety and being observed Q 15 minutes   Discussed her medications and lab reports with pt  Administered medications and monitored for effectiveness   Q 15 min checks R   Pt safe at present time and reports feeling better

## 2012-02-06 ENCOUNTER — Encounter (HOSPITAL_COMMUNITY): Payer: Self-pay | Admitting: Psychiatry

## 2012-02-06 DIAGNOSIS — F411 Generalized anxiety disorder: Secondary | ICD-10-CM

## 2012-02-06 DIAGNOSIS — F332 Major depressive disorder, recurrent severe without psychotic features: Secondary | ICD-10-CM

## 2012-02-06 LAB — GLUCOSE, CAPILLARY
Glucose-Capillary: 85 mg/dL (ref 70–99)
Glucose-Capillary: 73 mg/dL (ref 70–99)

## 2012-02-06 LAB — TSH: TSH: 2.212 u[IU]/mL (ref 0.350–4.500)

## 2012-02-06 LAB — HEMOGLOBIN A1C
Hgb A1c MFr Bld: 5.1 % (ref <5.7)
Mean Plasma Glucose: 100 mg/dL (ref <117)

## 2012-02-06 MED ORDER — DOCUSATE SODIUM 100 MG PO CAPS
100.0000 mg | ORAL_CAPSULE | Freq: Every day | ORAL | Status: DC
  Administered 2012-02-06 – 2012-02-09 (×4): 100 mg via ORAL
  Filled 2012-02-06 (×6): qty 1

## 2012-02-06 MED ORDER — IBUPROFEN 400 MG PO TABS
400.0000 mg | ORAL_TABLET | Freq: Three times a day (TID) | ORAL | Status: DC
  Administered 2012-02-06 – 2012-02-09 (×8): 400 mg via ORAL
  Filled 2012-02-06 (×15): qty 1

## 2012-02-06 MED ORDER — ZIPRASIDONE HCL 60 MG PO CAPS
60.0000 mg | ORAL_CAPSULE | Freq: Every day | ORAL | Status: DC
  Administered 2012-02-06 – 2012-02-08 (×3): 60 mg via ORAL
  Filled 2012-02-06 (×5): qty 1

## 2012-02-06 NOTE — Discharge Planning (Signed)
Pt came to aftercare planning group and tx team. Pt appears withdrawn, uncomfortable, and with depressed affect during group. She stated that depression/anxiety have increased over the past few months and she has been withdrawn lately. Pt sees Dr. Theotis Burrow in IOP and [name] for therapy. Pt will be moved to 500 hall today and will attend programming on 500.
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
## 2012-02-06 NOTE — Tx Team (Addendum)
Interdisciplinary Treatment Plan Update (Adult)  Date: 02/06/2012  Time Reviewed: 1000  Progress in Treatment: Attending groups: Yes Participating in groups:  Yes Taking medication as prescribed: Yes Tolerating medication:  Yes Family/Significant othe contact made:   Patient understands diagnosis:  Yes Discussing patient identified problems/goals with staff:  Yes Medical problems stabilized or resolved:  Yes Denies suicidal/homicidal ideation: No, passive SI. Issues/concerns per patient self-inventory:  None identified Other: N/A  New problem(s) identified: None Identified  Reason for Continuation of Hospitalization: Depression Medication stabilization   Interventions implemented related to continuation of hospitalization: mood stabilization, medication monitoring and adjustment, group therapy and psycho education, safety checks q 15 mins  Additional comments: Patient started on Geodon, Valium and Trazadone. Patient asked about referral for ECT treatment. She said that it was helpful in the past. Did say that her short term memory has been effected. Literature from [facility] provided to patient on ECT treatment.  Estimated length of stay: 3-5 days  Discharge Plan: Exploring ECT treatment.  New goal(s): N/A  Review of initial/current patient goals per problem list:   1.  Goal(s): Reduce depressive symptoms  Met:  No  Target date: by discharge  As evidenced by: Reducing depression from a 10 to a 3 as reported by pt.   2.  Goal (s): Eliminate Suicidal Ideation  Met:  No, endorses passive SI.  Target date: by discharge  As evidenced by: Eliminate suicidal ideation.     Attendees: Patient:  Katelyn [name] 02/06/2012 1000  Family:     Physician:  Dr. Laury Deep, MD 02/06/2012 1000  Nursing:      Case Manager:  Barrie Folk RN MS EdS 02/06/2012 1000  Counselor:  Veto Kemps, MT-BC 02/06/2012 1000  Other:  Shelda Jakes RN 02/06/2012 1000  Other:  Ledell Peoples Smart - LCSW  intern 02/06/2012 1000  Other:  Brendia Sacks 02/06/2012 1000  Other:      Scribe for Treatment Team:   Maybel Dambrosio 02/06/2012 1000

## 2012-02-06 NOTE — Progress Notes (Addendum)
D:  Pt isolative in room reading book, pt states "Crowds make me nervous, I don't do well with crowds."  Pt requesting dinner tray to be brought to room because pt does not feel comfortable going to cafeteria. Denies SI/HI/AVH at this time, able to contract for safety.  A:  Notified tech to bring pt tray to room for dinner, encouraged pt to try and go to group this evening  R:  Pt receptive, states "I will try and go to a group.  I plan on going to the cafeteria for breakfast in the morning."  Pt calm and cooperative.  No further complaints of requests.

## 2012-02-06 NOTE — Progress Notes (Signed)
Psychoeducational Group Note  Date:  02/06/2012 Time:  2000  Group Topic/Focus:  Karaoke night   Participation Level:  Did not attend   Participation Quality:  Did not attend  Affect:  Did not attend   Cognitive:  Did not attend   Insight:  Did not attend   Engagement in Group:  Did not attend   Additional Comments:  Pt did not attend karaoke this evening.   Mliss Wedin A 02/06/2012, 3:13 AM

## 2012-02-06 NOTE — H&P (Signed)
Katelyn [name] is an 36 y.o. female.    Chief Complaint: SI, Major Depression Disorder Recurrent, Anxiety  HPI: Per: Dr. Laury Deep:    Katelyn [name] is a 36 y/o woman with a past psychiatric history significant for Major Depressive disorder, severe with psychotic features. The patient reports that she had come for her outpatient therapy appointment and endorsed suicidal ideations with a plans. She reports she had done well in the past with ECT. She is currently taking nortriptyline and Ziprasidone. She reports she has not been taking Ziprasidone with food and has found the medication less effective   Past Medical History  Diagnosis Date  . Obesity   . Skin lesion     Excisional biopsy  . Depression     several suicide attempts, hospitaluzed in 2012 for this  . Diabetes mellitus type II   . Dizziness - light-headed   . Sleep apnea     Mild; moderate periodic limb movement disorder  . Polycystic ovary     Past Surgical History  Procedure Date  . Tonsillectomy   . Cystoscopy with urethral dilatation age 12   . 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 Skin lesion excision   . Cesarean section     Family History  Problem Relation Age of Onset  . Hypertension Mother     Iterstitial Cystist  . Depression Brother   . Colon polyps Father   . Depression Father    Social History:  reports that she quit smoking about 15 years ago. She does not have any smokeless tobacco history on file. She reports that she does not drink alcohol or use illicit drugs.  Allergies:  Allergies  Allergen Reactions  . Lithium     Catatonic state  . Penicillins     REACTION: Rash    Medications Prior to Admission  Medication Sig Dispense Refill  . aspirin-acetaminophen-caffeine (EXCEDRIN MIGRAINE) 250-250-65 MG per tablet Take 1 tablet by mouth daily as needed. For migraine headaches.      . diazepam (VALIUM) 5 MG tablet Take 5 mg by mouth daily as needed. For anxiety.      .  metFORMIN (GLUCOPHAGE) 500 MG tablet Take 500 mg by mouth 2 (two) times daily with a meal.        . nortriptyline (PAMELOR) 50 MG capsule Take 100 mg by mouth at bedtime.      . traZODone (DESYREL) 100 MG tablet Take 100 mg by mouth at bedtime as needed. For sleep.      . ziprasidone (GEODON) 60 MG capsule Take 60 mg by mouth at bedtime.      Marland Kitchen DISCONTD: nortriptyline (PAMELOR) 50 MG capsule Take 2 tab at bed time  60 capsule  0  . DISCONTD: ziprasidone (GEODON) 60 MG capsule Take 1 capsule (60 mg total) by mouth at bedtime.  30 capsule  0    Results for orders placed during the hospital encounter of 02/05/12 (from the past 48 hour(s))  GLUCOSE, CAPILLARY     Status: Abnormal   Collection Time   02/05/12  5:08 PM      Component Value Range Comment   Glucose-Capillary 63 (*) 70 - 99 mg/dL   URINALYSIS, ROUTINE W REFLEX MICROSCOPIC     Status: Normal   Collection Time   02/05/12  5:12 PM      Component Value Range Comment   Color, Urine YELLOW  YELLOW    APPearance CLEAR  CLEAR  Specific Gravity, Urine 1.005  1.005 - 1.030    pH 6.0  5.0 - 8.0    Glucose, UA NEGATIVE  NEGATIVE mg/dL    Hgb urine dipstick NEGATIVE  NEGATIVE    Bilirubin Urine NEGATIVE  NEGATIVE    Ketones, ur NEGATIVE  NEGATIVE mg/dL    Protein, ur NEGATIVE  NEGATIVE mg/dL    Urobilinogen, UA 0.2  0.0 - 1.0 mg/dL    Nitrite NEGATIVE  NEGATIVE    Leukocytes, UA NEGATIVE  NEGATIVE MICROSCOPIC NOT DONE ON URINES WITH NEGATIVE PROTEIN, BLOOD, LEUKOCYTES, NITRITE, OR GLUCOSE <1000 mg/dL.  PREGNANCY, URINE     Status: Normal   Collection Time   02/05/12  5:12 PM      Component Value Range Comment   Preg Test, Ur NEGATIVE  NEGATIVE   URINE RAPID DRUG SCREEN (HOSP PERFORMED)     Status: Normal   Collection Time   02/05/12  5:12 PM      Component Value Range Comment   Opiates NONE DETECTED  NONE DETECTED    Cocaine NONE DETECTED  NONE DETECTED    Benzodiazepines NONE DETECTED  NONE DETECTED    Amphetamines NONE  DETECTED  NONE DETECTED    Tetrahydrocannabinol NONE DETECTED  NONE DETECTED    Barbiturates NONE DETECTED  NONE DETECTED   CBC     Status: Abnormal   Collection Time   02/05/12  8:00 PM      Component Value Range Comment   WBC 14.8 (*) 4.0 - 10.5 K/uL    RBC 4.55  3.87 - 5.11 MIL/uL    Hemoglobin 14.2  12.0 - 15.0 g/dL    HCT 16.1  09.6 - 04.5 %    MCV 90.1  78.0 - 100.0 fL    MCH 31.2  26.0 - 34.0 pg    MCHC 34.6  30.0 - 36.0 g/dL    RDW 40.9  81.1 - 91.4 %    Platelets 371  150 - 400 K/uL   COMPREHENSIVE METABOLIC PANEL     Status: Normal   Collection Time   02/05/12  8:00 PM      Component Value Range Comment   Sodium 139  135 - 145 mEq/L    Potassium 3.9  3.5 - 5.1 mEq/L    Chloride 100  96 - 112 mEq/L    CO2 28  19 - 32 mEq/L    Glucose, Bld 73  70 - 99 mg/dL    BUN 8  6 - 23 mg/dL    Creatinine, Ser 7.82  0.50 - 1.10 mg/dL    Calcium 9.7  8.4 - 95.6 mg/dL    Total Protein 7.6  6.0 - 8.3 g/dL    Albumin 4.2  3.5 - 5.2 g/dL    AST 14  0 - 37 U/L    ALT 12  0 - 35 U/L    Alkaline Phosphatase 88  39 - 117 U/L    Total Bilirubin 0.3  0.3 - 1.2 mg/dL    GFR calc non Af Amer >90  >90 mL/min    GFR calc Af Amer >90  >90 mL/min   HEMOGLOBIN A1C     Status: Normal   Collection Time   02/05/12  8:00 PM      Component Value Range Comment   Hemoglobin A1C 5.1  <5.7 %    Mean Plasma Glucose 100  <117 mg/dL   TSH     Status: Normal   Collection Time  02/05/12  8:00 PM      Component Value Range Comment   TSH 2.212  0.350 - 4.500 uIU/mL   GLUCOSE, CAPILLARY     Status: Normal   Collection Time   02/05/12  9:44 PM      Component Value Range Comment   Glucose-Capillary 90  70 - 99 mg/dL    Comment 1 Notify RN     GLUCOSE, CAPILLARY     Status: Normal   Collection Time   02/06/12  6:15 AM      Component Value Range Comment   Glucose-Capillary 87  70 - 99 mg/dL   GLUCOSE, CAPILLARY     Status: Normal   Collection Time   02/06/12 12:12 PM      Component Value Range  Comment   Glucose-Capillary 85  70 - 99 mg/dL    No results found.  Review of Systems  Constitutional: Positive for weight loss (throught diet and excerise, has lost 90 lbs. in last year). Negative for fever, chills, malaise/fatigue and diaphoresis.  HENT: Negative for hearing loss, ear pain, nosebleeds, congestion, sore throat, neck pain, tinnitus and ear discharge.   Eyes: Positive for photophobia (Only when migriane). Negative for blurred vision, double vision, pain, discharge and redness.       Wear glasses.  Near sighted  Respiratory: Negative.  Negative for stridor.   Cardiovascular: Positive for palpitations (With anxiety (panic attacks)). Negative for orthopnea, claudication, leg swelling and PND.  Gastrointestinal: Positive for constipation Luisa Dago takes colace qd at home. No BM since admitted. last stool yesterday morning, constapated at that time.) and blood in stool (bright red with stool sometimes related to constipation). Negative for heartburn, nausea, vomiting, abdominal pain, diarrhea and melena.  Genitourinary: Negative.   Musculoskeletal: Positive for joint pain (c/o stiffness and joint pains in fingers (hands) bilat.  worse in the mornings). Negative for myalgias, back pain and falls.  Skin: Negative for itching and rash.  Neurological: Positive for headaches (States has headaches at least QOD. takes excedrine migraine for headaches.  c/o  headache now and was given motrin.). Negative for weakness.  Endo/Heme/Allergies: Positive for polydipsia. Negative for environmental allergies. Does not bruise/bleed easily.  Psychiatric/Behavioral: Positive for depression (hx of depression since high school (1992). rate 6/10), suicidal ideas (SI this morning.  States that she feels ok right now.  States that she just saw Katelyn [name] and spoke with her about ECT) and memory loss (Related to ECT treatment last september.  Has to write stuff down so that she can go back to refer.  States that if  asked questions she gets really confused). Negative for hallucinations and substance abuse. The patient is nervous/anxious (Hx of anxiety, panic attacks.  Nervvous around crowds, lound/busy, strangers. rate anxiety 8/10) and has insomnia (Restless, mind racing.  ).     Blood pressure 128/84, pulse 97, temperature 98.3 F (36.8 C), temperature source Oral, resp. rate 16, height 5' 2.5" (1.588 m), weight 101.606 kg (224 lb). Physical Exam  Constitutional: She is oriented to person, place, and time. She appears well-developed and well-nourished. She appears distressed (Patient anxious.  Has calmed down since moved to [address]. ).  HENT:  Head: Normocephalic.  Eyes: Conjunctivae normal and EOM are normal. Pupils are equal, round, and reactive to light. Right eye exhibits no discharge. Left eye exhibits no discharge.  Neck: Normal range of motion. Neck supple. No JVD present. No tracheal deviation present. No thyromegaly present.  Cardiovascular: Normal rate, regular rhythm, normal  heart sounds and intact distal pulses.  Exam reveals no gallop and no friction rub.   No murmur heard. Respiratory: Effort normal and breath sounds normal. No stridor. No respiratory distress. She has no wheezes. She has no rales. She exhibits no tenderness.  GI: Soft. Bowel sounds are normal. She exhibits no distension and no mass. There is no tenderness. There is no rebound and no guarding.  Musculoskeletal: Normal range of motion. She exhibits no edema and no tenderness.  Lymphadenopathy:    She has no cervical adenopathy.  Neurological: She is alert and oriented to person, place, and time. She has normal reflexes. She displays normal reflexes. No cranial nerve deficit. She exhibits normal muscle tone. Coordination normal.  Skin: Skin is warm and dry. No rash noted. She is not diaphoretic. No erythema. No pallor.       Left wrist scabbing from scratching from yesterday  Psychiatric: Judgment and thought content normal.         Mood/Affect somewhat flat.  Responding and answering questions. Recent memory intact.     Assessment/Plan Daily contact with patient to assess and evaluate symptoms and progress in treatment  Medication management  Patient able to participate in activities group therapy Colace for constipation   Navina Wohlers 02/06/2012, 1:09 PM

## 2012-02-06 NOTE — Progress Notes (Signed)
[facility] Group Notes:  (Counselor/Nursing/MHT/Case Management/Adjunct)  02/06/2012 3:42 PM  Type of Therapy:  Group Therapy at 1:15 to 2:30  Participation Level:  Did Not Attend; patient was in process of  Transferring to [address].  Katelyn [name]  02/06/2012 3:43 PM
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
## 2012-02-06 NOTE — Progress Notes (Signed)
Psychoeducational Group Note  Date:  02/06/2012 Time:  2005  Group Topic/Focus:  AA Group  Participation Level:  Minimal  Participation Quality:  Appropriate and Resistant  Affect:  Flat  Cognitive:  Appropriate  Insight:  Good  Engagement in Group:  Limited  Additional Comments:    Jishnu Jenniges K 02/06/2012, 10:51 PM
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
## 2012-02-06 NOTE — Progress Notes (Signed)
D: Patient appears  depressed with increased anxiety. Cooperative with assessment. Pt. Stated sleep was "poor ". Appetite is "poor". Pt rates depression was "10" and hopelessness was "10" out of 10 scale. Pt requested to be moved to a different hall because she feels closed in the 500 hall. Around 1145 am, a female patient entered patient  room and used the bathroom while the patient was in the room.  Patient a bit shaken up and refused to enter her room. Pt.  Denies HI/AV, passive SI  and contract for safety at this time. Offered no additional question or concerns.  A: patient was moved to 300 hall around 1150 am. Safety has been maintained with Q15 minutes observation. Supported and encouragement provided.  R: Pt. Seems less anxious and asked about group times and schedule. Patient remains safe. She is complaint with medications. Safety has been maintained Q15 and continue current POC.

## 2012-02-06 NOTE — Progress Notes (Signed)
Patient provided with [facility] literature on ECT treatment as requested by Dr. Molli Knock. Joice Lofts RN MS EdS 02/06/2012  1:40 PM

## 2012-02-07 DIAGNOSIS — F323 Major depressive disorder, single episode, severe with psychotic features: Secondary | ICD-10-CM

## 2012-02-07 NOTE — Progress Notes (Signed)
Psychoeducational Group Note  Date:  02/07/2012 Time:1000am  Group Topic/Focus:  Identifying Needs:   The focus of this group is to help patients identify their personal needs that have been historically problematic and identify healthy behaviors to address their needs.  Participation Level:  Active  Participation Quality:  Appropriate  Affect:  Anxious  Cognitive:  Appropriate  Insight:  Good  Engagement in Group:  Good  Additional Comments:    Valente David 02/07/2012,11:14 AM

## 2012-02-07 NOTE — Progress Notes (Signed)
[facility] Group Notes:  (Counselor/Nursing/MHT/Case Management/Adjunct)  02/07/2012 2100 Type of Therapy:  wrap up group  Participation Level:  Did Not Attend  Participation Quality:  did not attend  Affect:  Appropriate  Cognitive:  Appropriate  Insight:  did not attend  Engagement in Group:  did not attend  Engagement in Therapy:  did not attend  Modes of Intervention:  Clarification, Education and Support  Summary of Progress/Problems:Pt remained in bed sleeping during group.   Shelah Lewandowsky 02/07/2012, 10:01 PM

## 2012-02-07 NOTE — Progress Notes (Signed)
Patient ID: Katelyn [name], female   DOB: 11-14-1975, 36 y.o.   MRN: [id number]   [facility] Group Notes:  (Counselor/Nursing/MHT/Case Management/Adjunct)  02/07/2012 1:15 PM  Type of Therapy:  Group Therapy, Dance/Movement Therapy   Participation Level:  None  Participation Quality:  Resistant  Affect:  Appropriate  Cognitive:  Appropriate  Insight:  None  Engagement in Group:  None  Engagement in Therapy:  None  Modes of Intervention:  Clarification, Problem-solving, Role-play, Socialization and Support  Summary of Progress/Problems: Therapist and group members discussed sacrifices that they are willing to make for their recovery. Group members shared challenges and changes that need to be made in their lives. Group members discussed the 12 promises and which ones were important to them. Pt left at the beginning of the group when discussing recovery. Pt felt that she could not benefit from therapy because she has depression and anxiety, not an addiction.     [name] 02/07/2012. 2:50 PM

## 2012-02-07 NOTE — Progress Notes (Signed)
[facility] MD Progress Note  02/07/2012 1:48 PM Hospital Stay: 2 Diagnosis:   Axis I Maj. depressive disorder with psychotic features  Axis II deferred  Axis III see medical history  Axis IV moderate   ADL's:  Intact  Sleep: Fair  Appetite:  Fair  Suicidal Ideation:  Patient does report suicidal ideation with plan to overdose or to stop eating, but she does contract for safety here on the unit. Homicidal Ideation:  Patient denies suicidal ideation plan intent means or access.  AEB (as evidenced by): Subjective: Karine states that she is extremely anxious on the substance abuse unit that it is from alcohol and she is having difficulty sleeping in the loud noises bother her. She denies feeling that she has a substance abuse problem and requests to be moved to another hall and to attend programming on another hall. Mental Status Examination/Evaluation: Objective:  Appearance: Fairly Groomed  Patent attorney::  Fair  Speech:  Normal Rate  Volume:  Decreased  Mood:  Anxious and Depressed  Affect:  Depressed  Thought Process:  Coherent  Orientation:  Full  Thought Content:  WDL  Suicidal Thoughts:  Yes.  with intent/plan  Homicidal Thoughts:  No  Memory:  Immediate;   Fair Recent;   Fair Remote;   Fair  Judgement:  Impaired  Insight:  Lacking  Psychomotor Activity:  Normal  Concentration:  Fair  Recall:  Fair  Akathisia:  No  Handed:  Right  AIMS (if indicated):     Assets:  Communication Skills Housing Social Support  Sleep:  Number of Hours: 6.25    Vital Signs:Blood pressure 128/83, pulse 92, temperature 97.4 F (36.3 C), temperature source Oral, resp. rate 18, height 5' 2.5" (1.588 m), weight 101.606 kg (224 lb). Current Medications: Current Facility-Administered Medications  Medication Dose Route Frequency Provider Last Rate Last Dose  . alum & mag hydroxide-simeth (MAALOX/MYLANTA) 200-200-20 MG/5ML suspension 30 mL  30 mL Oral Q4H PRN Nelly Rout, MD      . docusate  sodium (COLACE) capsule 100 mg  100 mg Oral Daily Shuvon Rankin, NP   100 mg at 02/07/12 0804  . ibuprofen (ADVIL,MOTRIN) tablet 400 mg  400 mg Oral TID Larena Sox, MD   400 mg at 02/07/12 1202  . magnesium hydroxide (MILK OF MAGNESIA) suspension 30 mL  30 mL Oral Daily PRN Nelly Rout, MD      . metFORMIN (GLUCOPHAGE-XR) 24 hr tablet 500 mg  500 mg Oral BID WC Nelly Rout, MD   500 mg at 02/07/12 0803  . nortriptyline (PAMELOR) capsule 100 mg  100 mg Oral QHS Nelly Rout, MD   100 mg at 02/06/12 2131  . traZODone (DESYREL) tablet 100 mg  100 mg Oral QHS PRN Nelly Rout, MD      . ziprasidone (GEODON) capsule 60 mg  60 mg Oral Q supper Larena Sox, MD   60 mg at 02/06/12 1816  . DISCONTD: ziprasidone (GEODON) capsule 60 mg  60 mg Oral QHS Nelly Rout, MD   60 mg at 02/05/12 2215    Lab Results:  Results for orders placed during the hospital encounter of 02/05/12 (from the past 48 hour(s))  GLUCOSE, CAPILLARY     Status: Abnormal   Collection Time   02/05/12  5:08 PM      Component Value Range Comment   Glucose-Capillary 63 (*) 70 - 99 mg/dL   URINALYSIS, ROUTINE W REFLEX MICROSCOPIC     Status: Normal   Collection  Time   02/05/12  5:12 PM      Component Value Range Comment   Color, Urine YELLOW  YELLOW    APPearance CLEAR  CLEAR    Specific Gravity, Urine 1.005  1.005 - 1.030    pH 6.0  5.0 - 8.0    Glucose, UA NEGATIVE  NEGATIVE mg/dL    Hgb urine dipstick NEGATIVE  NEGATIVE    Bilirubin Urine NEGATIVE  NEGATIVE    Ketones, ur NEGATIVE  NEGATIVE mg/dL    Protein, ur NEGATIVE  NEGATIVE mg/dL    Urobilinogen, UA 0.2  0.0 - 1.0 mg/dL    Nitrite NEGATIVE  NEGATIVE    Leukocytes, UA NEGATIVE  NEGATIVE MICROSCOPIC NOT DONE ON URINES WITH NEGATIVE PROTEIN, BLOOD, LEUKOCYTES, NITRITE, OR GLUCOSE <1000 mg/dL.  PREGNANCY, URINE     Status: Normal   Collection Time   02/05/12  5:12 PM      Component Value Range Comment   Preg Test, Ur NEGATIVE  NEGATIVE   URINE RAPID  DRUG SCREEN (HOSP PERFORMED)     Status: Normal   Collection Time   02/05/12  5:12 PM      Component Value Range Comment   Opiates NONE DETECTED  NONE DETECTED    Cocaine NONE DETECTED  NONE DETECTED    Benzodiazepines NONE DETECTED  NONE DETECTED    Amphetamines NONE DETECTED  NONE DETECTED    Tetrahydrocannabinol NONE DETECTED  NONE DETECTED    Barbiturates NONE DETECTED  NONE DETECTED   CBC     Status: Abnormal   Collection Time   02/05/12  8:00 PM      Component Value Range Comment   WBC 14.8 (*) 4.0 - 10.5 K/uL    RBC 4.55  3.87 - 5.11 MIL/uL    Hemoglobin 14.2  12.0 - 15.0 g/dL    HCT 16.1  09.6 - 04.5 %    MCV 90.1  78.0 - 100.0 fL    MCH 31.2  26.0 - 34.0 pg    MCHC 34.6  30.0 - 36.0 g/dL    RDW 40.9  81.1 - 91.4 %    Platelets 371  150 - 400 K/uL   COMPREHENSIVE METABOLIC PANEL     Status: Normal   Collection Time   02/05/12  8:00 PM      Component Value Range Comment   Sodium 139  135 - 145 mEq/L    Potassium 3.9  3.5 - 5.1 mEq/L    Chloride 100  96 - 112 mEq/L    CO2 28  19 - 32 mEq/L    Glucose, Bld 73  70 - 99 mg/dL    BUN 8  6 - 23 mg/dL    Creatinine, Ser 7.82  0.50 - 1.10 mg/dL    Calcium 9.7  8.4 - 95.6 mg/dL    Total Protein 7.6  6.0 - 8.3 g/dL    Albumin 4.2  3.5 - 5.2 g/dL    AST 14  0 - 37 U/L    ALT 12  0 - 35 U/L    Alkaline Phosphatase 88  39 - 117 U/L    Total Bilirubin 0.3  0.3 - 1.2 mg/dL    GFR calc non Af Amer >90  >90 mL/min    GFR calc Af Amer >90  >90 mL/min   HEMOGLOBIN A1C     Status: Normal   Collection Time   02/05/12  8:00 PM      Component Value Range  Comment   Hemoglobin A1C 5.1  <5.7 %    Mean Plasma Glucose 100  <117 mg/dL   TSH     Status: Normal   Collection Time   02/05/12  8:00 PM      Component Value Range Comment   TSH 2.212  0.350 - 4.500 uIU/mL   GLUCOSE, CAPILLARY     Status: Normal   Collection Time   02/05/12  9:44 PM      Component Value Range Comment   Glucose-Capillary 90  70 - 99 mg/dL    Comment 1  Notify RN     GLUCOSE, CAPILLARY     Status: Normal   Collection Time   02/06/12  6:15 AM      Component Value Range Comment   Glucose-Capillary 87  70 - 99 mg/dL   GLUCOSE, CAPILLARY     Status: Normal   Collection Time   02/06/12 12:12 PM      Component Value Range Comment   Glucose-Capillary 85  70 - 99 mg/dL   GLUCOSE, CAPILLARY     Status: Normal   Collection Time   02/06/12  4:46 PM      Component Value Range Comment   Glucose-Capillary 73  70 - 99 mg/dL   GLUCOSE, CAPILLARY     Status: Normal   Collection Time   02/06/12  9:40 PM      Component Value Range Comment   Glucose-Capillary 91  70 - 99 mg/dL   GLUCOSE, CAPILLARY     Status: Abnormal   Collection Time   02/07/12  6:35 AM      Component Value Range Comment   Glucose-Capillary 103 (*) 70 - 99 mg/dL   GLUCOSE, CAPILLARY     Status: Normal   Collection Time   02/07/12 11:37 AM      Component Value Range Comment   Glucose-Capillary 98  70 - 99 mg/dL   Objective: Ivyrose appears to be quite anxious and mildly tremulous she reports some nausea so she is attending groups 100% she reports no side effects to her medication and her R. review of systems is negative. She does report seeing occasional objects in the wallpaper patterns on the wall she describes her depression as severe her anxiety is extremely severe. She does contract for safety on the unit.  Physical Findings: AIMS: Facial and Oral Movements Muscles of Facial Expression: None, normal Lips and Perioral Area: None, normal Jaw: None, normal Tongue: None, normal,Extremity Movements Upper (arms, wrists, hands, fingers): None, normal Lower (legs, knees, ankles, toes): None, normal, Trunk Movements Neck, shoulders, hips: None, normal, Overall Severity Severity of abnormal movements (highest score from questions above): None, normal Incapacitation due to abnormal movements: None, normal Patient's awareness of abnormal movements (rate only patient's report): No  Awareness, Dental Status Current problems with teeth and/or dentures?: No Does patient usually wear dentures?: No  CIWA:    COWS:     Treatment Plan Summary: Daily contact with patient to assess and evaluate symptoms and progress in treatment Medication management  Plan: 1. We'll plan to move the patient to be 500 hall. 2. If that is not available will at least allow her program on the 500 hall. 3. labs are reviewed and are unremarkable. 4. continue Colace 100 mg by mouth daily. 5. Continue ibuprofen 400 mg by mouth 3 times a day. 6. Continue metformin ask our 24 hour 500 mg tablets one twice a day with meals. 7. Nortriptyline 100 mg by mouth  each bedtime. 8. Geodon 60 mg by mouth daily with evening meal. 9. Trazodone 100 mg by mouth at at bedtime when necessary sleep.  10. We'll continue to follow. [facility] 02/07/2012, 1:48 PM

## 2012-02-07 NOTE — Progress Notes (Signed)
D.  Took over Pt care at 2330.  Pt resting in bed with eyes closed, respirations even and unlabored.  No distress noted.  A.  Will continue to monitor.  R.  Pt remains safe

## 2012-02-07 NOTE — Progress Notes (Signed)
D.  Pt anxious on approach.  Pt states that she has requested medication information sheets on Geodon and nortryptaline and none have as yet been provided.  Pt also states that she has not gotten to go to a depression group since coming in on Thursday and that is why she is here.  Requests to be able to attend groups on 500 hall  Denies SI/HI/hallucinations.  Pt is interacting appropriately on unit, was in dayroom earlier.  A.  Assured Pt that it will be made known that she is to attend groups on 500 hall and tomorrow she will do so. There is an order to this effect in the chart that I did not see in time to act on it tonight, apologized to Pt for this oversight.  Pt does not wish to move to the 500 hall, only to program there.  Printed medication information requested by Pt and gave this to Pt tonight.  R.  Pt pleased to get the information requested and also that tomorrow she will be programming on 500 hall.  Pt was seen in dayroom reading the printouts she had been given and interacting appropriately with peers.  No acute distress noted.

## 2012-02-07 NOTE — Progress Notes (Signed)
Patient ID: Katelyn [name], female   DOB: 18-Feb-1976, 36 y.o.   MRN: [id number]  Pt. attended and participated in aftercare planning group. Pt. verbally accepted information on suicide prevention, warning signs to look for with suicide and crisis line numbers to use. Pt. listed their current anxiety level as 7 and their current depression level as 9.

## 2012-02-07 NOTE — Progress Notes (Signed)
Patient ID: Katelyn [name], female   DOB: [birth year], 36 y.o.   MRN: [id number] She has been up and to groups interacting with peers and staff. Self inventory: depression 7, hopelessness 3, denies w/d symptoms, has SI off and on contracts fr safety.

## 2012-02-08 NOTE — Progress Notes (Addendum)
Psychoeducational Group Note  Date:  02/08/2012 Time:  1000am  Group Topic/Focus:  Making Healthy Choices:   The focus of this group is to help patients identify negative/unhealthy choices they were using prior to admission and identify positive/healthier coping strategies to replace them upon discharge.  Participation Level:  Did not attend  Participation Quality:    Affect:    Cognitive:    Insight:    Engagement in Group:    Additional Comments:    Valente David 02/08/2012,10:22 AM

## 2012-02-08 NOTE — Progress Notes (Signed)
Patient ID: Katelyn [name], female   DOB: 11-Jan-1976, 35 y.o.   MRN: [id number]  Pt did not attend aftercare planning group.

## 2012-02-08 NOTE — Progress Notes (Signed)
Patient ID: OTTIS CRITCHER, female   DOB: [birth year], 36 y.o.   MRN: [id number] 02-08-12 @ 1312 nursing adm note: D: pt spoke with rn and stated she was uncomfortable going to group on the 500 hall. She is housed on the 300 hall.   She stated it was causing her to have panic attacks. This was a new order. A: rn did 1:1 with pt and advised her she didn't have to go to the group but perhaps she could go to group in very small increments,stay 10 mins and increase it slowly. She was anxious and tearful. R: pt was not sure she would be able to do this. rn will monitor and q 15 min cks continue.

## 2012-02-08 NOTE — Progress Notes (Signed)
Patient ID: Katelyn [name], female   DOB: 26-May-1975, 36 y.o.   MRN: [id number]  Pt. attended and participated in aftercare planning group on the 500 hall. Pt. accepted information on suicide prevention, warning signs to look for with suicide and crisis line numbers to use. The pt. agreed to call crisis line numbers if having warning signs or having thoughts of suicide. Pt. listed their current anxiety level as 10 and her depression as a 8 (both out of  10 with 1 being lowest and 10 being highest).  Pt stated that she is having SI and is able to contract for safety.

## 2012-02-08 NOTE — Progress Notes (Signed)
Patient ID: Katelyn [name], female   DOB: 04-14-1976, 36 y.o.   MRN: [id number]   [facility] Group Notes:  (Counselor/Nursing/MHT/Case Management/Adjunct)  02/08/2012 1:15 PM  Type of Therapy:  Group Therapy, Dance/Movement Therapy   Participation Level:  Minimal  Participation Quality:  Inattentive  Affect:  Appropriate  Cognitive:  Appropriate  Insight:  None  Engagement in Group:  None  Engagement in Therapy:  None  Modes of Intervention:  Clarification, Problem-solving, Role-play, Socialization and Support  Summary of Progress/Problems: Therapist and group members created a list of what addiction has cost them and what they would like to say to their addiction if they could. Group members shared their personal strengths and read the poem, " I am your recovery" and discussed what they liked. Pt shared that her strength is that she is a good listener.      [name] 02/08/2012. 2:50 PM

## 2012-02-08 NOTE — Progress Notes (Signed)
D.  Pt in room on approach.  Pt states that today was "a disaster".  She attempted to program on 500 hall because she wanted to attend groups related to depression.  Pt states that she has severe social anxiety and when she walked into the very full room with Pts that she did not know, she had a panic attack and had to leave group.  Pt states that she did not attend any other groups over there the rest of the day but did feel comfortable going to groups on 300 hall because she knows the people and it is a smaller number attending.  Denies SI/HI/hallucinations.  Interacting appropriately with peers on 300 hall.  A.  Support and encouragement offered.  Explained that Pt can still get the depression work book and information, and re-enforced that Pt could go to group a small amount of time each day to acclimatize.  Pt willing to work on this.  R.  Pt brighter, in room preparing for bed, no acute distress noted.  Will continue to monitor.

## 2012-02-08 NOTE — Progress Notes (Signed)
[facility] Group Notes:  (Counselor/Nursing/MHT/Case Management/Adjunct)  02/08/2012 10:38 AM  Type of Therapy:  Psychoeducational Skills  Participation Level:  Minimal  Participation Quality:  Appropriate, Inattentive and Resistant  Affect:  Depressed and Flat  Cognitive:  Appropriate and Oriented  Insight:  Good  Engagement in Group:  Limited  Engagement in Therapy:  Limited  Modes of Intervention:  Activity, Education and Support  Summary of Progress/Problems: Pt attended and participated in group where pts discussed "Ending the Cycle" from Sunday workbook. Pts participation was limited but did discuss how she has been loosing weight but now she is not. She has been in this cycle in the past many times but it is hard to work her way out of it. Pt discussed strategies of how to begin a positive cycle again.    Katelyn [name] 02/08/2012, 10:38 AM

## 2012-02-09 DIAGNOSIS — F3289 Other specified depressive episodes: Principal | ICD-10-CM

## 2012-02-09 DIAGNOSIS — F329 Major depressive disorder, single episode, unspecified: Principal | ICD-10-CM

## 2012-02-09 LAB — GLUCOSE, CAPILLARY: Glucose-Capillary: 93 mg/dL (ref 70–99)

## 2012-02-09 MED ORDER — TRAZODONE HCL 100 MG PO TABS: 100.0000 mg | ORAL_TABLET | Freq: Every evening | ORAL | Status: DC | PRN

## 2012-02-09 MED ORDER — METFORMIN HCL ER 500 MG PO TB24: 500.0000 mg | ORAL_TABLET | Freq: Two times a day (BID) | ORAL | Status: DC

## 2012-02-09 MED ORDER — NORTRIPTYLINE HCL 50 MG PO CAPS: 100.0000 mg | ORAL_CAPSULE | Freq: Every day | ORAL | Status: DC

## 2012-02-09 MED ORDER — ZIPRASIDONE HCL 60 MG PO CAPS: 60.0000 mg | ORAL_CAPSULE | Freq: Every day | ORAL | Status: DC

## 2012-02-09 NOTE — Tx Team (Signed)
Interdisciplinary Treatment Plan Update (Adult)  Date:  02/09/2012  Time Reviewed:  9:39 AM   Progress in Treatment: Attending groups: Yes Participating in groups:  Yes Taking medication as prescribed: Yes Tolerating medication:  Yes Family/Significant othe contact made:  No Patient understands diagnosis:  Yes Discussing patient identified problems/goals with staff:  Yes Medical problems stabilized or resolved:  Yes Denies suicidal/homicidal ideation: Yes Issues/concerns per patient self-inventory:  None identified Other: N/A  New problem(s) identified: None Identified  Reason for Continuation of Hospitalization: Stable to d/c  Interventions implemented related to continuation of hospitalization: Stable to d/c  Additional comments: N/A  Estimated length of stay: D/C today  Discharge Plan: Pt will follow up with [facility] for medication management and therapy.  New goal(s): N/A  Review of initial/current patient goals per problem list:    1. Goal(s): Reduce depressive symptoms  Met: Yes Target date: by discharge  As evidenced by: Reducing depression from a 10 to a 3 as reported by pt. Pt rates depression at a 2 and anxiety at a 5 and reports feels stable to d/c.     2. Goal (s): Eliminate Suicidal Ideation  Met: Yes Target date: by discharge  As evidenced by: Eliminate suicidal ideation.    Attendees: Patient:  Katelyn [name]  02/09/2012 9:39 AM   Family:     Physician:  Patrick North, MD 02/09/2012 9:39 AM   Nursing: Alease Frame, RN 02/09/2012 9:39 AM   Case Manager:  Reyes Ivan, LCSWA 02/09/2012 9:39 AM   Counselor:  Ronda Fairly, LCSWA 02/09/2012 9:39 AM   Other:  Charlyne Mom, RN 02/09/2012 9:40 AM   Other:  Roswell Miners, RN 02/09/2012 9:39 AM   Other:  Jules Schick, RN 02/09/2012 9:40 AM   Other:      Scribe for Treatment Team:   Reyes Ivan 02/09/2012 9:39 AM

## 2012-02-09 NOTE — Progress Notes (Signed)
Patient ID: Katelyn [name], female   DOB: 11-08-1975, 36 y.o.   MRN: [id number] Patient appears calm and cooperative, denies SI/HI upon discharge.  D/C plan reviewed  and patient verbalized understanding. All personal items returned to patient. Patient states she will comply with OP services and medications as prescribed. Patient escorted to lobby for discharge.

## 2012-02-09 NOTE — [facility] Suicide Risk Assessment (Signed)
Suicide Risk Assessment  Discharge Assessment     Demographic Factors:  Female, Caucasian, marriied  Mental Status Per Nursing Assessment::   On Admission:  Self-harm thoughts  Current Mental Status by Physician: Patient alert and oriented to 4. Speech normal in rate and volume. Mood improved significantly. Denies SI/HI/VH/AH. Fair insight and judgement.  Loss Factors: NA  Historical Factors: Family history of mental illness or substance abuse  Risk Reduction Factors:   Responsible for children under 27 years of age, Sense of responsibility to family, Positive social support, Positive therapeutic relationship and Positive coping skills or problem solving skills  Continued Clinical Symptoms:  Depression:   Recent sense of peace/wellbeing  Cognitive Features That Contribute To Risk:  Cognitively intact  Suicide Risk:  Minimal: No identifiable suicidal ideation.  Patients presenting with no risk factors but with morbid ruminations; may be classified as minimal risk based on the severity of the depressive symptoms  Discharge Diagnoses:   AXIS I:  Depressive Disorder NOS AXIS II:  No diagnosis AXIS III:   Past Medical History  Diagnosis Date  . Obesity   . Skin lesion     Excisional biopsy  . Depression     several suicide attempts, hospitaluzed in 2012 for this  . Diabetes mellitus type II   . Dizziness - light-headed   . Sleep apnea     Mild; moderate periodic limb movement disorder  . Polycystic ovary    AXIS IV:  other psychosocial or environmental problems AXIS V:  61-70 mild symptoms  Plan Of Care/Follow-up recommendations:  Activity:  normal Diet:  normal Follow up with Dr.Afrin out patient.  Is patient on multiple antipsychotic therapies at discharge:  No   Has Patient had three or more failed trials of antipsychotic monotherapy by history:  No  Recommended Plan for Multiple Antipsychotic Therapies: NA  Shawnita Krizek 02/09/2012, 10:57 AM

## 2012-02-09 NOTE — Discharge Summary (Signed)
Physician Discharge Summary Note  Patient:  Katelyn [name] is an 36 y.o., female MRN:  [id number] DOB:  July 07, 1975 Patient phone:  [phone] (home)  Patient address:   [address] [place] [address]   Date of Admission:  02/05/2012 Date of Discharge: 02/09/2012  Discharge Diagnoses: Principal Problem:  *DEPRESSION Active Problems:  ANXIETY DISORDER  Axis Diagnosis:  AXIS I: Depressive Disorder NOS  AXIS II: No diagnosis  AXIS III:  Past Medical History   Diagnosis  Date   .  Obesity    .  Skin lesion      Excisional biopsy   .  Depression      several suicide attempts, hospitaluzed in 2012 for this   .  Diabetes mellitus type II    .  Dizziness - light-headed    .  Sleep apnea      Mild; moderate periodic limb movement disorder   .  Polycystic ovary     AXIS IV: other psychosocial or environmental problems  AXIS V: 61-70 mild symptoms   Level of Care:  Inpatient Hospitalization.  Reason for admission: [name] is a 36 y/o woman with a past psychiatric history significant for Major Depressive disorder, severe with psychotic features. The patient reports that she had come for her outpatient therapy appointment and endorsed suicidal ideations with a plans. She reports she had done well in the past with ECT. She is currently taking nortriptyline and Ziprasidone. She reports she has not been taking Ziprasidone with food and has found the medication less effective   Hospital Course:   The patient attended treatment team meeting this am and met with treatment team members. The patient's symptoms, treatment plan and response to treatment was discussed. The patient endorsed that their symptoms have improved. The patient also stated that they felt stable for discharge.  They reported that from this hospital stay they had learned many coping skills.  In other to maintain their psychiatric stability, they will continue psychiatric care on an outpatient basis. They will  follow-up as outlined below.  In addition they were instructed  to take all your medications as prescribed by their mental healthcare provider and to report any adverse effects and or reactions from your medicines to their outpatient provider promptly.  The patient is also instructed and cautioned to not engage in alcohol and or illegal drug use while on prescription medicines.  In the event of worsening symptoms the patient is instructed to call the crisis hotline, 911 and or go to the nearest ED for appropriate evaluation and treatment of symptoms.   Also while a patient in this hospital, the patient received medication management for her psychiatric symptoms. They were ordered and received as outlined below:    Medication List     As of 02/09/2012  1:00 PM    STOP taking these medications         aspirin-acetaminophen-caffeine 250-250-65 MG per tablet   Commonly known as: EXCEDRIN MIGRAINE      diazepam 5 MG tablet   Commonly known as: VALIUM      metFORMIN 500 MG tablet   Commonly known as: GLUCOPHAGE      TAKE these medications      Indication    metFORMIN 500 MG 24 hr tablet   Commonly known as: GLUCOPHAGE-XR   Take 1 tablet (500 mg total) by mouth 2 (two) times daily with a meal. For diabetes       nortriptyline 50 MG capsule  Commonly known as: PAMELOR   Take 2 capsules (100 mg total) by mouth at bedtime.    Indication: Depression      traZODone 100 MG tablet   Commonly known as: DESYREL   Take 1 tablet (100 mg total) by mouth at bedtime as needed for sleep (to help with sleep.).       ziprasidone 60 MG capsule   Commonly known as: GEODON   Take 1 capsule (60 mg total) by mouth daily with supper. For depression    Indication: Psychosis       They were also enrolled in group counseling sessions and activities in which they participated actively. Patient reported having a schedule for an entire week which would help with her day to day activities, help her exercise  and reduce her anxiety.      Follow-up Information    Follow up with [facility] - Outpatient. On 02/12/2012. (Appointment scheduled at 8:30 am with Dr. Lolly Mustache)    Contact information:   [address] [place], [place] [address] [phone]      Follow up with [facility] - Outpatient. On 02/19/2012. (Appointment scheduled at 3:00 pm with [name])    Contact information:   [address] [place], [place] [address] [phone]         Upon discharge, patient adamantly denies suicidal, homicidal ideations, auditory, visual hallucinations and or delusional thinking. They left [facility] with all personal belongings via personal transportation in no apparent distress.  Consults:  Please see electronic medical record for details.  Significant Diagnostic Studies:  Please see electronic medical record for details.  Discharge Vitals:   Blood pressure 124/79, pulse 96, temperature 97.7 F (36.5 C), temperature source Oral, resp. rate 16, height 5' 2.5" (1.588 m), weight 101.606 kg (224 lb)..  Mental Status Exam:Patient alert and oriented to 4. Speech normal in rate and volume. Mood improved significantly. Denies SI/HI/VH/AH. Fair insight and judgement.    Discharge destination:  Home  Is patient on multiple antipsychotic therapies at discharge:  No  Has Patient had three or more failed trials of antipsychotic monotherapy by history: N/A Recommended Plan for Multiple Antipsychotic Therapies: N/A Discharge Orders    Future Appointments: Provider: Department: Dept Phone: Center:   03/03/2012 8:15 AM Kerri Perches, MD [facility] [phone] [facility]     Future Orders Please Complete By Expires   Diet - low sodium heart healthy      Increase activity slowly          Medication List     As of 02/09/2012  1:00 PM    STOP taking these medications         aspirin-acetaminophen-caffeine 250-250-65 MG per tablet   Commonly known as: EXCEDRIN MIGRAINE       diazepam 5 MG tablet   Commonly known as: VALIUM      metFORMIN 500 MG tablet   Commonly known as: GLUCOPHAGE      TAKE these medications      Indication    metFORMIN 500 MG 24 hr tablet   Commonly known as: GLUCOPHAGE-XR   Take 1 tablet (500 mg total) by mouth 2 (two) times daily with a meal. For diabetes       nortriptyline 50 MG capsule   Commonly known as: PAMELOR   Take 2 capsules (100 mg total) by mouth at bedtime.    Indication: Depression      traZODone 100 MG tablet   Commonly known as: DESYREL  Take 1 tablet (100 mg total) by mouth at bedtime as needed for sleep (to help with sleep.).       ziprasidone 60 MG capsule   Commonly known as: GEODON   Take 1 capsule (60 mg total) by mouth daily with supper. For depression    Indication: Psychosis           Follow-up Information    Follow up with [facility] - Outpatient. On 02/12/2012. (Appointment scheduled at 8:30 am with Dr. Lolly Mustache)    Contact information:   [address] [place], [place] [address] [phone]      Follow up with [facility] - Outpatient. On 02/19/2012. (Appointment scheduled at 3:00 pm with [name])    Contact information:   [address] [place], [place] [address] [phone]        Follow-up recommendations:   Activities: Resume typical activities Diet: Resume typical diet Other: Follow up with outpatient provider and report any side effects to out patient prescriber.  Comments:  Take all your medications as prescribed by your mental healthcare provider. Report any adverse effects and or reactions from your medicines to your outpatient provider promptly. Patient is instructed and cautioned to not engage in alcohol and or illegal drug use while on prescription medicines. In the event of worsening symptoms, patient is instructed to call the crisis hotline, 911 and or go to the nearest ED for appropriate evaluation and treatment of symptoms. Follow-up  with your primary care provider for your other medical issues, concerns and or health care needs.  Signed: Jacquelin Krajewski 02/09/2012 1:00 PM

## 2012-02-09 NOTE — Progress Notes (Signed)
Patient did attend the evening speaker AA meeting.  

## 2012-02-09 NOTE — Progress Notes (Signed)
Psychoeducational Group Note  Date:  02/09/2012 Time:  1100  Group Topic/Focus:  Wellness Toolbox:   The focus of this group is to discuss various aspects of wellness, balancing those aspects and exploring ways to increase the ability to experience wellness.  Patients will create a wellness toolbox for use upon discharge.  Participation Level:  Active  Participation Quality:  Resistant  Affect:  Appropriate  Cognitive:  Appropriate  Insight:  Good  Engagement in Group:  Good  Additional Comments:  none  [name] M 02/09/2012, 1:31 PM

## 2012-02-09 NOTE — Progress Notes (Signed)
[facility] Case Management Discharge Plan:  Will you be returning to the same living situation after discharge: Yes,  returning home At discharge, do you have transportation home?:Yes,  access to transportation Do you have the ability to pay for your medications:Yes,  access to meds   Release of information consent forms completed and in the chart;  Patient's signature needed at discharge.  Patient to Follow up at:  Follow-up Information    Follow up with [facility] - Outpatient. On 02/12/2012. (Appointment scheduled at 8:30 am with Dr. Lolly Mustache)    Contact information:   [address] [place], [place] [address] [phone]      Follow up with [facility] - Outpatient. On 02/19/2012. (Appointment scheduled at 3:00 pm with [name])    Contact information:   [address] [place], [place] [address] [phone]         Patient denies SI/HI:   Yes,  denies SI/HI    Safety Planning and Suicide Prevention discussed:  Yes,  discussed with pt today  Barrier to discharge identified:No.  Summary and Recommendations: Pt attended discharge planning group and actively participated in group.  SW provided pt with today's workbook.  Pt presents with calm mood and affect.  Pt rates depression at a 2 and anxiety at a 5 today.  Pt denies SI/HI.  Pt reports feeling stable to d/c today.  No recommendations from SW.  No further needs voiced by pt.  Pt stable to discharge.     Carmina Miller 02/09/2012, 10:57 AM

## 2012-02-10 ENCOUNTER — Ambulatory Visit (HOSPITAL_COMMUNITY): Payer: Self-pay | Admitting: Psychiatry

## 2012-02-10 NOTE — Progress Notes (Signed)
Patient Discharge Instructions:  After Visit Summary (AVS):   Access to EMR:  02/10/12 Discharge Summary Note:   Access to EMR:  02/10/12 Psychiatric Admission Assessment Note:   Access to EMR:  02/10/12 Suicide Risk Assessment - Discharge Assessment:   Access to EMR:  02/10/12 Faxed/Sent to the Next Level Care provider:  02/10/12  EMR to [facility]  Karleen Hampshire Brittini, 02/10/2012, 3:14 PM

## 2012-02-12 ENCOUNTER — Encounter (HOSPITAL_COMMUNITY): Payer: Self-pay | Admitting: Psychiatry

## 2012-02-12 ENCOUNTER — Ambulatory Visit (INDEPENDENT_AMBULATORY_CARE_PROVIDER_SITE_OTHER): Payer: 59 | Admitting: Psychiatry

## 2012-02-12 VITALS — Wt 224.0 lb

## 2012-02-12 DIAGNOSIS — F329 Major depressive disorder, single episode, unspecified: Secondary | ICD-10-CM

## 2012-02-12 DIAGNOSIS — F323 Major depressive disorder, single episode, severe with psychotic features: Secondary | ICD-10-CM

## 2012-02-12 MED ORDER — ZIPRASIDONE HCL 80 MG PO CAPS: 80.0000 mg | ORAL_CAPSULE | Freq: Every day | ORAL | Status: DC

## 2012-02-12 MED ORDER — DIAZEPAM 5 MG PO TABS: 5.0000 mg | ORAL_TABLET | Freq: Every day | ORAL | Status: DC | PRN

## 2012-02-12 NOTE — Progress Notes (Signed)
[facility] [address] Progress Note  Katelyn [name] [id number] 36 y.o.  02/12/2012 9:29 AM  Chief Complaint: I was recently discharged from behavioral Health Center.  It was not a good experience.  I feel more anxious.  History of Present Illness:  Patient is 36 year old female who is recently discharged from behavioral Center.  She was admitted after experiencing suicidal thoughts increased paranoia and having hallucination.  She was seeing therapist and she was recommended for inpatient psychiatric treatment.  Patient endorse that hospitalist day was more anxiety provoking.  Her Geodon was increased to 60 mg.  She admitted having some visual hallucination however her paranoia is somewhat better.  She continues to have poor sleep and excessive anxiety.  She is not taking any Valium.  She denies any active or passive suicidal thoughts but endorse increased anxiety and sometime having visual hallucination.  She admitted seeing shadows however she denied any command auditory hallucination.  She denies any tremors or shakes but any medication. Suicidal Ideation: No Plan Formed: No Patient has means to carry out plan: No  Homicidal Ideation: No Plan Formed: No Patient has means to carry out plan: No  Review of Systems: Psychiatric: Agitation: No Hallucination: Yes Depressed Mood: Yes Insomnia: Yes Hypersomnia: No Altered Concentration: No Feels Worthless: Yes Grandiose Ideas: No Belief In Special Powers: No New/Increased Substance Abuse: No Compulsions: No  Neurologic: Headache: Yes Seizure: No Paresthesias: No  Past Medical Family, Social History: Patient lives with her husband and children.  Her parents are very supportive who live close by.  Patient has history of polycystic ovary disease, obesity and chronic pain.  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  Outpatient Encounter Prescriptions as of 02/12/2012    Medication Sig Dispense Refill  . metFORMIN (GLUCOPHAGE-XR) 500 MG 24 hr tablet Take 1 tablet (500 mg total) by mouth 2 (two) times daily with a meal. For diabetes  60 tablet  0  . nortriptyline (PAMELOR) 50 MG capsule Take 2 capsules (100 mg total) by mouth at bedtime.  30 capsule  0  . traZODone (DESYREL) 100 MG tablet Take 1 tablet (100 mg total) by mouth at bedtime as needed for sleep (to help with sleep.).  30 tablet  0  . ziprasidone (GEODON) 80 MG capsule Take 1 capsule (80 mg total) by mouth daily with supper. For depression  30 capsule  0  . DISCONTD: ziprasidone (GEODON) 60 MG capsule Take 1 capsule (60 mg total) by mouth daily with supper. For depression  30 capsule  0  . diazepam (VALIUM) 5 MG tablet Take 1 tablet (5 mg total) by mouth daily as needed for anxiety.  30 tablet  0    Past Psychiatric History/Hospitalization(s): Anxiety: Yes Bipolar Disorder: No Depression: Yes Mania: No Psychosis: Yes Schizophrenia: No Personality Disorder: No Hospitalization for psychiatric illness: Yes History of Electroconvulsive Shock Therapy: Yes Prior Suicide Attempts: No  Physical Exam: Constitutional:  Wt 224 lb (101.606 kg)  General Appearance: alert, oriented, no acute distress  Musculoskeletal: Strength & Muscle Tone: within normal limits Gait & Station: normal Patient leans: N/A  Psychiatric: Speech (describe rate, volume, coherence, spontaneity, and abnormalities if any): Soft and clear with normal tone volume.  Her speech is spontaneous.  Thought Process (describe rate, content, abstract reasoning, and computation): Thought processes slow but logical goal-directed.  She has good fund of knowledge.  She appears very anxious.  Associations: Relevant and Intact  Thoughts: hallucinations  patient endorse visual hallucination and sometime having paranoid thinking.  She has difficulty trusting people. Mental Status: Orientation: oriented to person, place, time/date and  situation Mood & Affect: depressed affect, anxiety and flat affect Attention Span & Concentration: Fair.  Medical Decision Making (Choose Three): Review of Psycho-Social Stressors (1), Review or order clinical lab tests (1), Review and summation of old records (2), Established Problem, Worsening (2), Review of Medication Regimen & Side Effects (2) and Review of New Medication or Change in Dosage (2)  Assessment: Axis I: Maj. depressive disorder with psychotic features  Axis II: Deferred  Axis III: Polycystic ovary disease chronic pain and obesity  Axis IV: Moderate  Axis V: 50-55   Plan: I review her psychosocial stressors, recent discharge summary and discharge medication.  During her hospital stay her Geodon was increased to 60 mg.  She shown some improvement as feeling less paranoid however she continued to have visual hallucination and extreme anxiety.  Her last hemoglobin A1c is 5.1.  She is taking metformin for her polycystic ovary disease.  She scheduled to see her OB/GYN in next few weeks.  I recommend to increase Geodon to 80 mg a daughter did is build hallucination and paranoid thinking.  At this time patient does not have any side effects including any tremors or shakes.  I also added Valium 5 mg as needed which she stopped taking.  I recommend to call us if she is any question or concern about the medication or if she feels worsening of the symptom.  She will see therapist in [place] office.  She also like to see me in [place] office.  We discussed safety plan that anytime having suicidal thoughts or homicidal thoughts and she need to call 911 or go to local emergency room.  More than 50% of the time spent in counseling psychoeducation" of care.  ARFEEN,SYED T., MD 02/12/2012

## 2012-02-13 ENCOUNTER — Ambulatory Visit (INDEPENDENT_AMBULATORY_CARE_PROVIDER_SITE_OTHER): Payer: 59 | Admitting: Psychiatry

## 2012-02-13 ENCOUNTER — Encounter (HOSPITAL_COMMUNITY): Payer: Self-pay | Admitting: Psychiatry

## 2012-02-13 DIAGNOSIS — F333 Major depressive disorder, recurrent, severe with psychotic symptoms: Secondary | ICD-10-CM

## 2012-02-13 NOTE — Progress Notes (Signed)
   THERAPIST PROGRESS NOTE  Session Time: 3:00-3:50 pm  Participation Level: Active  Behavioral Response: Casual, Neat and Well GroomedAlertAnxious and Depressed  Type of Therapy: Individual Therapy  Treatment Goals addressed: Anxiety and Diagnosis: Major Depression, Recurrent, Severe with psychosis  Interventions: Solution Focused and Assertiveness Training  Summary: Katelyn [name] is a 36 y.o. female who presents with low depression and high anxious mood. This is patients first session since her most recent inpatient hospitalization for depression. Patient was discharged this past Sunday. She presents with clear and goal directed thinking and is able to take an active role in her therapy today. She states the Geodon she is taking has helped her with her thought process and ability to be assertive and set healthier limits with others. She is aware of things she needs to do to help her manage her depression and anxiety symptoms and is less defensive and avoidant. She agrees to continue the medication and take all medications as prescribed by her psychiatrist. She states she had visual hallucinations while in the hospital for the first time and had some auditory hallucinations as well. She denies any current psychosis. She agreed that she needs to be more active in managing her symptoms and agreed to use the next session to create her daily mantenance list to manage mental health symptoms. She talked of being "tired of putting out fires" in her life and wanting to start making more permanent healthy changes. She states she is aware she has an unhealthy friend in her life that is negatively affecting her wellness Francena Hanly: diagnosed with Borderline Personality Disorder) who patient met while she was in the inpatient unit two years ago. She states when her friend found out she was in the hospital, she admitted herself as well. Patient states she is fearful of this person due to how sick she is and  wanted to use part of the session to determine how to set limits with her. Patient wrote down what she plans to send to her to create distance in the relationship until the next therapy session. She agreed to avoid contact with her and contact the police if she feels at all threatened by her behaviors after setting the limits.    Suicidal/Homicidal: Nowithout intent/plan  Therapist Response: Reviewed progress since being discharged from the hospital. Set limits regarding patient taking medications as prescribed and following medical recommendations. Educated patient healthy relationships and on affective boundary setting.  Plan: Return again in 1 week. Use session to create daily maintenance plan despite other crisis that may be going on. Patient agrees to use this session to build basic skills for daily wellness.   Diagnosis: Axis I: Major Depression, Recurrent, Severe with psychosis and Anxiety Disorder    Axis II: None    Marcos Peloso E, LCSW 02/13/2012

## 2012-02-19 ENCOUNTER — Ambulatory Visit (INDEPENDENT_AMBULATORY_CARE_PROVIDER_SITE_OTHER): Payer: 59 | Admitting: Psychiatry

## 2012-02-19 ENCOUNTER — Encounter (HOSPITAL_COMMUNITY): Payer: Self-pay | Admitting: Psychiatry

## 2012-02-19 DIAGNOSIS — F411 Generalized anxiety disorder: Secondary | ICD-10-CM

## 2012-02-19 DIAGNOSIS — F333 Major depressive disorder, recurrent, severe with psychotic symptoms: Secondary | ICD-10-CM

## 2012-02-19 NOTE — Progress Notes (Signed)
   THERAPIST PROGRESS NOTE  Session Time: 3:00-4:00 pm  Participation Level: Active  Behavioral Response: CasualAlertDepressed  Type of Therapy: Individual Therapy  Treatment Goals addressed: Coping  Interventions: Other: WRAP  Summary: JASARA DARBYSHIRE is a 36 y.o. female who presents with depressed mood, affect and low energy. She states her mood has shifted from anxiety to depression since last visit. She came prepared to complete her Wellness Recovery Action Plan as was discussed in the previous session. She was focused and goal oriented on this task. She identified her daily schedule and items that she would need to do to ensure optimal wellness and reduce symptoms of depression or anxiety. She was able to complete a schedule until after dinner time, but the session came to a close before the evening time slots could be discussed. She states "evening is a difficult time" because she is normally so tired from trying to function throughout her day that she goes to bed early from exhaustion. She is hoping this new routine will allow her to enjoy her evenings more. She agreed to contact [employer] from [employer] to have a consultation about how to use essential oils to enhance mood stability before next session.    Suicidal/Homicidal: Nowithout intent/plan  Therapist Response: Assessed overall level of functioning. Assisted in the creation of a daily maintenance plan using WRAP skills. Assigned homework, contact Chief of Staff from [employer] to have a consultation about how to use essential oils to enhance mood stability before next session.    Plan: Return again in 2 weeks. Finish completing Daily Maintenance List from [employer] and begin creating the list of supplimental wellness skills.   Diagnosis: Axis I: Depression, Major, Severe with psychosis and Anxiety Disorder    Axis II: None    Saraya Tirey E, LCSW 02/19/2012

## 2012-02-19 NOTE — Patient Instructions (Addendum)
Homework: Follow up with getting an appointment with [facility]

## 2012-03-03 ENCOUNTER — Encounter: Payer: Self-pay | Admitting: Family Medicine

## 2012-03-03 ENCOUNTER — Encounter: Payer: Self-pay | Admitting: Gastroenterology

## 2012-03-03 ENCOUNTER — Ambulatory Visit (INDEPENDENT_AMBULATORY_CARE_PROVIDER_SITE_OTHER): Payer: 59 | Admitting: Family Medicine

## 2012-03-03 VITALS — BP 120/78 | HR 78 | Resp 15 | Ht 66.0 in | Wt 217.0 lb

## 2012-03-03 DIAGNOSIS — R5383 Other fatigue: Secondary | ICD-10-CM

## 2012-03-03 DIAGNOSIS — R5381 Other malaise: Secondary | ICD-10-CM

## 2012-03-03 DIAGNOSIS — F411 Generalized anxiety disorder: Secondary | ICD-10-CM

## 2012-03-03 DIAGNOSIS — Z23 Encounter for immunization: Secondary | ICD-10-CM

## 2012-03-03 DIAGNOSIS — E282 Polycystic ovarian syndrome: Secondary | ICD-10-CM

## 2012-03-03 DIAGNOSIS — F333 Major depressive disorder, recurrent, severe with psychotic symptoms: Secondary | ICD-10-CM

## 2012-03-03 DIAGNOSIS — Z8371 Family history of colonic polyps: Secondary | ICD-10-CM | POA: Insufficient documentation

## 2012-03-03 DIAGNOSIS — D72829 Elevated white blood cell count, unspecified: Secondary | ICD-10-CM

## 2012-03-03 DIAGNOSIS — E669 Obesity, unspecified: Secondary | ICD-10-CM

## 2012-03-03 DIAGNOSIS — Z83719 Family history of colon polyps, unspecified: Secondary | ICD-10-CM | POA: Insufficient documentation

## 2012-03-03 LAB — CBC
MCHC: 34.7 g/dL (ref 30.0–36.0)
Platelets: 361 10*3/uL (ref 150–400)
RDW: 13.8 % (ref 11.5–15.5)
WBC: 8.3 10*3/uL (ref 4.0–10.5)

## 2012-03-03 LAB — VITAMIN B12: Vitamin B-12: 348 pg/mL (ref 211–911)

## 2012-03-03 NOTE — Progress Notes (Signed)
  Subjective:    Patient ID: Katelyn [name], female    DOB: 03-Apr-1976, 36 y.o.   MRN: [id number]  HPI  Pt has had  recent hospitalization for suicide attempt 3 weeks ago, thinks most of her symptoms are being aggravated by pMS symptoms. Father has a h/o polyposis of colon, had 30 the last time he was checked, she has rectal bleeding with a fissure, i believe GI eval is warranted. C/o fatigue and poor sleep has appt with psych in the next 2 days when this will be addressed, probably halcion may be beneficial. She ahs joined weigh watchers, and continues to regularly exercise with excellent weight loss success. At one time she was binging and purgingto help with weight loss but she recognizes this as unhealthy, and has stopped. Needs the flu vaccine today   Review of Systems See HPI Denies recent fever or chills.c/o fatigue, has questions re the need for B12 or vit D Denies sinus pressure, nasal congestion, ear pain or sore throat. Denies chest congestion, productive cough or wheezing. Denies chest pains, palpitations and leg swelling Denies abdominal pain, nausea, vomiting,diarrhea or constipation.   Denies dysuria, frequency, hesitancy or incontinence. Denies joint pain, swelling and limitation in mobility. Denies headaches, seizures, numbness, or tingling.  Denies skin break down or rash.        Objective:   Physical Exam  Patient alert and oriented and in no cardiopulmonary distress.  HEENT: No facial asymmetry, EOMI, no sinus tenderness,  oropharynx pink and moist.  Neck supple no adenopathy.  Chest: Clear to auscultation bilaterally.  CVS: S1, S2 no murmurs, no S3.  ABD: Soft non tender. Bowel sounds normal.  Ext: No edema  MS: Adequate ROM spine, shoulders, hips and knees.  Skin: Intact, no ulcerations or rash noted.  Psych: Good eye contact, blunted affect. Memory intact not anxious or depressed appearing.  CNS: CN 2-12 intact, power, tone and sensation normal  throughout.       Assessment & Plan:

## 2012-03-03 NOTE — Patient Instructions (Addendum)
F/u in 4 month,, please call if you need me.  B12 and Vit D and cbc today, dx of fatigue and leukocytosis  Flu vaccine today  Increase colace up to 4 daily, increase water , vegetable and fruit intake  Congrats on continued weight loss.  Ask about halcion for possible help with sleep   You are referred for GI eval due to family history

## 2012-03-04 ENCOUNTER — Ambulatory Visit (INDEPENDENT_AMBULATORY_CARE_PROVIDER_SITE_OTHER): Payer: 59 | Admitting: Psychiatry

## 2012-03-04 DIAGNOSIS — F411 Generalized anxiety disorder: Secondary | ICD-10-CM

## 2012-03-04 DIAGNOSIS — F333 Major depressive disorder, recurrent, severe with psychotic symptoms: Secondary | ICD-10-CM

## 2012-03-04 LAB — VITAMIN D 25 HYDROXY (VIT D DEFICIENCY, FRACTURES): Vit D, 25-Hydroxy: 50 ng/mL (ref 30–89)

## 2012-03-04 NOTE — Progress Notes (Signed)
   THERAPIST PROGRESS NOTE  Session Time: 3:00-3:50 pm  Participation Level: Active  Behavioral Response: Casual, Neat and Well GroomedAlertDepressed  Type of Therapy: Individual Therapy  Treatment Goals addressed: Treatment Plan Goals of Depression and Anxiety  Interventions: Other: Wellness Recovery Action Planning  Summary: Katelyn [name] is a 36 y.o. female who presents with moderate depressed mood and mild anxiety. She appears to be functioning at a higher level than previous sessions and presents with minimal anxiety and depression symptoms. She rated her overall wellness at a 5/10, which is higher than she has been at in a long time. Patient states the Geodon is helping her be more assertive and make decisions clearer.  Patient states she has managed her mood over the past two weeks since previous session and described how her daily maitenance list has helped her to manage her depression and anxiety symptoms. She went over her schedule and identified the items that were working and the items that needed to be re-explored. She talked about how she is trying to reduce her anxiety from being around her children by providing more structure for herself and for her children. She states her depression is higher right now than her anxiety. She identified a need to continue to explore her daily maintenance list and come up with evening activities for when the children go to bed for her to do to increase her wellness. She states she continues to struggle with trying to lose weight, but is working the [employer] program and feels it is under control and no longer in control of her.   Suicidal/Homicidal: Nowithout intent/plan  Therapist Response: Reviewed WRAP - Daily Maintenance list that was discussed during previous session and added to the wellness activities and explored how the plan is actually working as it has been put into practice. Explored how creating structure with her children  could reduce her anxiety symptoms.   Plan: Return again in 2 weeks. Review Daily Maintenance List, Explore evening activities for patients wellness and follow up with using aromatherapy, Lavender for anxiety management during homework time.   Diagnosis: Axis I: Major Depression, Recurrent, Severe with psychosis and Anxiety Disorder NOS    Axis II: No Diagnosis    Nansi Birmingham E, LCSW 03/04/2012

## 2012-03-05 ENCOUNTER — Ambulatory Visit (INDEPENDENT_AMBULATORY_CARE_PROVIDER_SITE_OTHER): Payer: 59 | Admitting: Psychiatry

## 2012-03-05 ENCOUNTER — Encounter (HOSPITAL_COMMUNITY): Payer: Self-pay | Admitting: Psychiatry

## 2012-03-05 VITALS — Wt 217.0 lb

## 2012-03-05 DIAGNOSIS — R5383 Other fatigue: Secondary | ICD-10-CM | POA: Insufficient documentation

## 2012-03-05 DIAGNOSIS — F323 Major depressive disorder, single episode, severe with psychotic features: Secondary | ICD-10-CM

## 2012-03-05 DIAGNOSIS — F329 Major depressive disorder, single episode, unspecified: Secondary | ICD-10-CM

## 2012-03-05 MED ORDER — DIAZEPAM 5 MG PO TABS: 5.0000 mg | ORAL_TABLET | Freq: Every day | ORAL | Status: DC | PRN

## 2012-03-05 MED ORDER — NORTRIPTYLINE HCL 50 MG PO CAPS: 100.0000 mg | ORAL_CAPSULE | Freq: Every day | ORAL | Status: DC

## 2012-03-05 MED ORDER — ZIPRASIDONE HCL 80 MG PO CAPS: 80.0000 mg | ORAL_CAPSULE | Freq: Every day | ORAL | Status: DC

## 2012-03-05 NOTE — Assessment & Plan Note (Signed)
Improved. Pt applauded on succesful weight loss through lifestyle change, and encouraged to continue same. Weight loss goal set for the next several months.  

## 2012-03-05 NOTE — Assessment & Plan Note (Signed)
Poor sleep and chronic fatigue. Check B12 and vit D levels also CBC,had recent leukocytosis

## 2012-03-05 NOTE — Assessment & Plan Note (Signed)
Pt to be referred to GI for consideration for screening colonoscopy. Also hashad rectal bleeding in the past

## 2012-03-05 NOTE — Assessment & Plan Note (Signed)
Close f/u and treatment by psych to continue

## 2012-03-05 NOTE — Assessment & Plan Note (Signed)
Maintained on metformin for same, now has regular cycles espescially with weight loss. Increased PMS symptoms, several options discussed with her gyne including use of OCP

## 2012-03-05 NOTE — Progress Notes (Signed)
[facility] [id number] Progress Note  Katelyn [name] [id number] 36 y.o.  03/05/2012 10:48 AM  Chief Complaint: I'm doing better on increased Geodon.  I lost some weight.  History of Present Illness:  Patient is 36 year old female who came for her followup appointment.  On her last visit we increased Geodon to 80 mg.  She's doing much better.  She is less anxious less depressed and less paranoid.  She still has some anxiety leaving her house but she started to have more interaction and activities in her daily life.  She denies any tremors or shakes.  She denies any panic attack.  She has some time insomnia but overall she is better with Geodon.  She start weight watcher but is also helping her weight loss.  She feel increased self-esteem and low confidence.  She seeing therapist regularly.  She denies any side effects of medication.  She takes Valium most of the days for anxiety.  She does not drink or use any illegal substance.  She denies any recent paranoid thinking or any auditory or visual hallucination.  She denies any active or passive suicidal thoughts.  Suicidal Ideation: No Plan Formed: No Patient has means to carry out plan: No  Homicidal Ideation: No Plan Formed: No Patient has means to carry out plan: No  Review of Systems: Psychiatric: Agitation: No Hallucination: no Depressed Mood: no Insomnia: Yes Hypersomnia: No Altered Concentration: No Feels Worthless: Yes Grandiose Ideas: No Belief In Special Powers: No New/Increased Substance Abuse: No Compulsions: No  Neurologic: Headache: Yes Seizure: No Paresthesias: No  Past Medical Family, Social History: Patient lives with her husband and children.  Her parents are very supportive who live close by.  Patient has history of polycystic ovary disease, obesity and chronic pain.  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  Outpatient Encounter Prescriptions as of  03/05/2012  Medication Sig Dispense Refill  . diazepam (VALIUM) 5 MG tablet Take 1 tablet (5 mg total) by mouth daily as needed for anxiety.  30 tablet  0  . metFORMIN (GLUCOPHAGE-XR) 500 MG 24 hr tablet Take 1 tablet (500 mg total) by mouth 2 (two) times daily with a meal. For diabetes  60 tablet  0  . nortriptyline (PAMELOR) 50 MG capsule Take 2 capsules (100 mg total) by mouth at bedtime.  30 capsule  0  . ziprasidone (GEODON) 80 MG capsule Take 1 capsule (80 mg total) by mouth daily with supper. For depression  30 capsule  0    Past Psychiatric History/Hospitalization(s): Anxiety: Yes Bipolar Disorder: No Depression: Yes Mania: No Psychosis: Yes Schizophrenia: No Personality Disorder: No Hospitalization for psychiatric illness: Yes History of Electroconvulsive Shock Therapy: Yes Prior Suicide Attempts: No  Physical Exam: Constitutional:  Wt 217 lb (98.431 kg)  General Appearance: alert, oriented, no acute distress  Musculoskeletal: Strength & Muscle Tone: within normal limits Gait & Station: normal Patient leans: N/A  Psychiatric: Speech (describe rate, volume, coherence, spontaneity, and abnormalities if any): Soft and clear with normal tone volume.  Her speech is spontaneous.  Thought Process (describe rate, content, abstract reasoning, and computation): Thought processes slow but logical goal-directed.  She has good fund of knowledge.  She appears very anxious.  Associations: Relevant and Intact  Thoughts: Organized and logical.   Mental Status: Orientation: oriented to person, place, time/date and situation Mood & Affect: Endorse good mood but improved affect. Attention Span & Concentration: Fair.  Assessment: Axis  I: Maj. depressive disorder with psychotic features  Axis II: Deferred  Axis III: Polycystic ovary disease chronic pain and obesity  Axis IV: Moderate  Axis V: 50-55   Plan: Patient is doing much better with increased Geodon.  She stopped  taking trazodone.  She still has some insomnia but overall she is doing better on her current psychiatric medication.  I recommend to call us if she is any question or concern about the medication if he feel worsening of the symptoms.  She will see therapist for coping and social skills.  I will see her again in 5-6 weeks.  Pasqualino Witherspoon T., MD 03/05/2012

## 2012-03-05 NOTE — Assessment & Plan Note (Signed)
Uses valium as needed per psych

## 2012-03-18 ENCOUNTER — Ambulatory Visit (INDEPENDENT_AMBULATORY_CARE_PROVIDER_SITE_OTHER): Payer: 59 | Admitting: Psychiatry

## 2012-03-18 DIAGNOSIS — F411 Generalized anxiety disorder: Secondary | ICD-10-CM

## 2012-03-18 DIAGNOSIS — F333 Major depressive disorder, recurrent, severe with psychotic symptoms: Secondary | ICD-10-CM

## 2012-03-18 NOTE — Progress Notes (Signed)
   THERAPIST PROGRESS NOTE  Session Time: 2:00-2:50 pm  Participation Level: Active  Behavioral Response: CasualAlertDepressed  Type of Therapy: Individual Therapy  Treatment Goals addressed: Coping  Interventions: Other: Wellness Recovery Action Planning  Summary: Katelyn [name] is a 36 y.o. female who presents with moderate depressed and affect. She reports an increase in depressive symptoms including low energy, increased desire to sleep, lack of interest, a loss of control with her food through binging and purging and feelings of hopelessness. She denies thoughts of suicide and expressed frustration with the return of her symptoms after feeling good for the last few weeks. She said she does not understand why her depression returned, but indicated a stressor occuring on Monday where she learned that her uncle (age 45) was killed this past Monday in a car accident and also having a difficult week with her husband. She states these events should not have shook her as badly as they did. She said structure is the most helpful way to manage her anxiety and depression but states her daily maintenance list is not working because she does not have the energy or motivation to do everything on the list and she has swung into an "all or nothing" mode. She expressed wanting to work on skills she could use over the next two weeks to manage her depression and created her "crisis wellness list" which she plans to use while she is in an active state of depression. She identified the "must do" items each day to ensure a level of functioning and agreed to contact writer if her symptoms worsen or go to the hospital if she is unable to stay safe.   Suicidal/Homicidal: Nowithout intent/plan  Therapist Response: Assessed level of depression and reviewed current symptoms. Created the WRAP crisis maintenance list for patient to follow to manage depression symptoms and had patient contract for safety and agree to  use crisis management plan if symptoms worsen.  Plan: Return again in 2 weeks. Review level of depression and WRAP list to see if it is effective in managing depression.    Diagnosis: Axis I: Depression, major recurrent, severe    Axis II: No diagnosis    Carman Ching, LCSW 03/18/2012

## 2012-03-22 ENCOUNTER — Encounter: Payer: Self-pay | Admitting: Gastroenterology

## 2012-03-22 ENCOUNTER — Ambulatory Visit (INDEPENDENT_AMBULATORY_CARE_PROVIDER_SITE_OTHER): Payer: 59 | Admitting: Gastroenterology

## 2012-03-22 VITALS — BP 100/70 | HR 100 | Ht 65.0 in | Wt 221.2 lb

## 2012-03-22 DIAGNOSIS — Z8371 Family history of colonic polyps: Secondary | ICD-10-CM

## 2012-03-22 DIAGNOSIS — K921 Melena: Secondary | ICD-10-CM

## 2012-03-22 DIAGNOSIS — Z800 Family history of malignant neoplasm of digestive organs: Secondary | ICD-10-CM

## 2012-03-22 MED ORDER — HYDROCORTISONE ACE-PRAMOXINE 2.5-1 % RE CREA: TOPICAL_CREAM | Freq: Two times a day (BID) | RECTAL | Status: DC

## 2012-03-22 MED ORDER — MOVIPREP 100 G PO SOLR: 1.0000 | Freq: Once | ORAL | Status: DC

## 2012-03-22 NOTE — Patient Instructions (Addendum)
You have been scheduled for a colonoscopy with propofol. Please follow written instructions given to you at your visit today.  Please pick up your prep kit at the pharmacy within the next 1-3 days. If you use inhalers (even only as needed) or a CPAP machine, please bring them with you on the day of your procedure.  We have sent the following medications to your pharmacy for you to pick up at your convenience: Analpram.  You can use over the counter Desitin for rectal irritation daily in combination with baby wipes after a bowel movement.  cc: Syliva Overman, MD

## 2012-03-22 NOTE — Progress Notes (Signed)
History of Present Illness: This is a 36 year old female who relates a long history of irritable bowel syndrome with urgent loose stools. Her bowel habits changed about one year ago to mild constipation when she began nortriptyline. Her constipation is controlled using Colace. She has had a history of hemorrhoids and apparently an anal fissure. She notes scant bright red blood per rectum occurring about once per month for the past few months. Recently she has noted perianal discomfort. Her father has a history of colon polyps and a paternal aunt developed colon cancer at age 19. Denies weight loss, abdominal pain, diarrhea, change in stool caliber, melena, nausea, vomiting, dysphagia, reflux symptoms, chest pain.  Review of Systems: Pertinent positive and negative review of systems were noted in the above HPI section. All other review of systems were otherwise negative.  Current Medications, Allergies, Past Medical History, Past Surgical History, Family History and Social History were reviewed in [employer] record.  Physical Exam: General: Well developed , well nourished, no acute distress Head: Normocephalic and atraumatic Eyes:  sclerae anicteric, EOMI Ears: Normal auditory acuity Mouth: No deformity or lesions Neck: Supple, no masses or thyromegaly Lungs: Clear throughout to auscultation Heart: Regular rate and rhythm; no murmurs, rubs or bruits Abdomen: Soft, non tender and non distended. No masses, hepatosplenomegaly or hernias noted. Normal Bowel sounds Rectal: no internal lesions, small external hemorrhoidal tags, moderate perianal erythema, heme - brown stool Musculoskeletal: Symmetrical with no gross deformities  Skin: No lesions on visible extremities Pulses:  Normal pulses noted Extremities: No clubbing, cyanosis, edema or deformities noted Neurological: Alert oriented x 4, grossly nonfocal Cervical Nodes:  No significant cervical adenopathy Inguinal Nodes:  No significant inguinal adenopathy Psychological:  Alert and cooperative. Flat affect  Assessment and Recommendations:  1. Hematochezia. Family history of colon polyps and colon cancer. I suspect hematochezia is due to a benign anorectal source such as internal hemorrhoids. The risks, benefits, and alternatives to colonoscopy with possible biopsy and possible polypectomy were discussed with the patient and they consent to proceed.   2. Constipation, likely medication induced. Continue Colace.  3. Nonspecific perianal dermatitis. Desitin and Analpram as directed.

## 2012-03-30 ENCOUNTER — Ambulatory Visit (HOSPITAL_COMMUNITY)
Admission: RE | Admit: 2012-03-30 | Discharge: 2012-03-30 | Disposition: A | Discharge status: Home / Self Care | Payer: 59 | Attending: Psychiatry | Admitting: Psychiatry
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
## 2012-03-31 ENCOUNTER — Other Ambulatory Visit (HOSPITAL_COMMUNITY): Payer: 59 | Attending: Psychiatry | Admitting: Psychiatry

## 2012-03-31 ENCOUNTER — Encounter (HOSPITAL_COMMUNITY): Payer: Self-pay

## 2012-03-31 DIAGNOSIS — F4310 Post-traumatic stress disorder, unspecified: Secondary | ICD-10-CM | POA: Insufficient documentation

## 2012-03-31 DIAGNOSIS — F332 Major depressive disorder, recurrent severe without psychotic features: Secondary | ICD-10-CM | POA: Insufficient documentation

## 2012-03-31 DIAGNOSIS — F411 Generalized anxiety disorder: Secondary | ICD-10-CM

## 2012-03-31 DIAGNOSIS — F333 Major depressive disorder, recurrent, severe with psychotic symptoms: Secondary | ICD-10-CM

## 2012-03-31 DIAGNOSIS — E119 Type 2 diabetes mellitus without complications: Secondary | ICD-10-CM | POA: Insufficient documentation

## 2012-03-31 MED ORDER — CLONAZEPAM 1 MG PO TABS: 1.0000 mg | ORAL_TABLET | Freq: Two times a day (BID) | ORAL | Status: DC

## 2012-04-01 ENCOUNTER — Other Ambulatory Visit (HOSPITAL_COMMUNITY): Payer: 59 | Admitting: Psychiatry

## 2012-04-01 ENCOUNTER — Ambulatory Visit (HOSPITAL_COMMUNITY): Payer: Self-pay | Admitting: Psychiatry

## 2012-04-01 DIAGNOSIS — F411 Generalized anxiety disorder: Secondary | ICD-10-CM

## 2012-04-01 DIAGNOSIS — F333 Major depressive disorder, recurrent, severe with psychotic symptoms: Secondary | ICD-10-CM

## 2012-04-01 NOTE — Progress Notes (Unsigned)
    Daily Group Progress Note  Program: IOP  Group Time: 9:00-10:30 am   Participation Level: Active  Behavioral Response: Appropriate  Type of Therapy:  Process Group  Summary of Progress: Today was patients first day in the group after telling writer that she felt she needed to return to the group therapy program for support for increasing depression symptoms. She appeared anxious but was attentive and observed the group process.      Group Time: 10:30 am - 12:00 pm   Participation Level:  Active  Behavioral Response: Appropriate  Type of Therapy: Psycho-education Group  Summary of Progress: Patient discussed the homework assignment from the previous group on self-esteem and listed positive qualities about self to challenge negative perceptions and self-talk.   Carman Ching, LCSW

## 2012-04-01 NOTE — Progress Notes (Deleted)
Katelyn [name] is a 36 y.o. female in treatment for *** and displays the following risk factors for Suicide:  Demographic factors:  {CHL AMB BH Suicide Demographics:21022747:a} Current Mental Status: {CHL AMB BH Suicide Current Mental Status:21022748:a} Loss Factors: {CHL AMB BH Suicide Loss Factors:21022749:a} Historical Factors: {CHL AMB BH Suicide Historical Factors:21022750:a} Risk Reduction Factors: {CHL AMB BH Suicide Risk Reduction Factors:21022751:a}  CLINICAL FACTORS:  {Clinical Factors:22706}  COGNITIVE FEATURES THAT CONTRIBUTE TO RISK: {Cognitive Features:22703}    SUICIDE RISK:  {BHH SUICIDE RISK:22704}  Mental Status: *** General Appearance /Behavior:  {BHH GENERAL APPEARANCE/BEHAVIOR:22300} Eye Contact:  {BHH EYE CONTACT:22301} Motor Behavior:  {BHH MOTOR BEHAVIOR:22302} Speech:  {BHH SPEECH:22304} Level of Consciousness:  {BHH LEVEL OF CONSCIOUSNESS:22305} Mood:  {BHH MOOD:22306} Affect:  {BHH AFFECT:22307} Anxiety Level:  {BHH ANXIETY LEVEL:22308} Thought Process:  {BHH THOUGHT PROCESS:22309} Thought Content:  {BHH THOUGHT CONTENT:22310} Perception:  {BHH PERCEPTION:22311} Judgment:  {BHH JUDGMENT:22312} Insight:  {BHH INSIGHT:22313} Cognition:  {BHH COGNITION:22314} Sleep: ***  PLAN OF CARE: ***   Carman Ching, LCSW 04/01/2012, 8:33 AM

## 2012-04-01 NOTE — Progress Notes (Signed)
    Daily Group Progress Note  Program: IOP  Group Time: 9:00-10:30 am   Participation Level: Minimal  Behavioral Response: Appropriate  Type of Therapy:  Process Group  Summary of Progress: Today was patients first full day in the group. She appeared very anxious and was fidgeting and did not not participate. She observed the group process but appeared overwhelmed in the group setting. She is working on feeling safe and comfortable in the social setting.      Group Time: 10:30 am - 12:00 pm   Participation Level:  Active  Behavioral Response: Appropriate  Type of Therapy: Psycho-education Group  Summary of Progress: Patient participated in learning the DBT skill of Mindfulness. Patient learned the steps to accessing WISE mind through observe, describe and participate and was given homework to practice the skill.  Carman Ching, LCSW
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
## 2012-04-01 NOTE — Progress Notes (Signed)
Patient ID: Katelyn [name], female   DOB: [birth year], 36 y.o.   MRN: [id number] Addendum to Admit Note: D:  Pt denied any SI/HI.  Admits to some paranoia, added that it was better though.  Denied any A/V hallucinations.

## 2012-04-02 ENCOUNTER — Other Ambulatory Visit (HOSPITAL_COMMUNITY): Payer: 59 | Admitting: Psychiatry

## 2012-04-02 ENCOUNTER — Ambulatory Visit (HOSPITAL_COMMUNITY): Payer: Self-pay | Admitting: Psychiatry

## 2012-04-02 DIAGNOSIS — F411 Generalized anxiety disorder: Secondary | ICD-10-CM

## 2012-04-02 DIAGNOSIS — F333 Major depressive disorder, recurrent, severe with psychotic symptoms: Secondary | ICD-10-CM

## 2012-04-02 NOTE — Progress Notes (Signed)
Patient ID: Katelyn [name], female   DOB: Feb 04, 1976, 36 y.o.   MRN: [id number] Patient seen today, states that since she started taking Klonopin 1 mg at bedtime she has been able to sleep for 5 hours. Continues to feel tired.and depressed Will monitor for another 2 days and then if she still is feeling depressed we'll consider adjusting doses of Pamelor and Geodon. Patient denies suicidal or homicidal ideation.

## 2012-04-02 NOTE — Progress Notes (Signed)
    Daily Group Progress Note  Program: IOP  Group Time: 9:00-10:30 am   Participation Level: Active  Behavioral Response: Appropriate  Type of Therapy:  Process Group  Summary of Progress: Patient reports no progress in her depression and anxiety since starting the group, but said she feels better being in a place where she can talk with others who also have depression and understand her symptoms. She said it was helpful having members ask her questions today about her depression and she was talkative in her response. She said she struggles with social anxiety and recently had events trigger flashbacks from past traumas. She is doing well with expressing feelings and sharing her reason for entering treatment.      Group Time: 10:30 am - 12:00 pm   Participation Level:  Active  Behavioral Response: Appropriate  Type of Therapy: Psycho-education Group  Summary of Progress: Patient practiced the skill of Mindfulness that was discussed yesterday and reported back on what patient did for homework to practice the skill. Patient discussed ways to continue using mindfulness throughout the day to manage stress and make more affective decisions. Patient practiced a ten minute mindfulness meditation Mortimer Fries) and was given access to the web site to use over the weekend.   Carman Ching, LCSW

## 2012-04-05 ENCOUNTER — Other Ambulatory Visit (HOSPITAL_COMMUNITY): Payer: 59

## 2012-04-05 ENCOUNTER — Telehealth (HOSPITAL_COMMUNITY): Payer: Self-pay | Admitting: Psychiatry

## 2012-04-06 ENCOUNTER — Other Ambulatory Visit (HOSPITAL_COMMUNITY): Payer: 59 | Admitting: Psychiatry

## 2012-04-06 DIAGNOSIS — F411 Generalized anxiety disorder: Secondary | ICD-10-CM

## 2012-04-06 DIAGNOSIS — F333 Major depressive disorder, recurrent, severe with psychotic symptoms: Secondary | ICD-10-CM
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
## 2012-04-07 ENCOUNTER — Other Ambulatory Visit (HOSPITAL_COMMUNITY): Payer: 59 | Admitting: Psychiatry

## 2012-04-07 DIAGNOSIS — F411 Generalized anxiety disorder: Secondary | ICD-10-CM

## 2012-04-07 DIAGNOSIS — F333 Major depressive disorder, recurrent, severe with psychotic symptoms: Secondary | ICD-10-CM

## 2012-04-07 NOTE — Progress Notes (Signed)
Patient ID: Katelyn [name], female   DOB: 03-21-1976, 36 y.o.   MRN: [id number] Patient reviewed and interviewed today, states she is doing better on the Klonopin and is able to sleep up to 4 hours. Has been worried since her husband was fired from his job but then has been rehired on [employer] basis. Patient had a panic attack today in group, felt people were sitting too close to her. Discussed CBT for panic attacks and encouraged her to practice relaxation 3-4 times a day she stated understanding. Will increase her Klonopin 1 mg at bedtime and 0.5 mg q. a.m. Patient denies suicidal or homicidal ideation and has no hallucinations or delusions.

## 2012-04-07 NOTE — Progress Notes (Signed)
    Daily Group Progress Note  Program: IOP  Group Time: 9:00-10:30 am   Participation Level: Active  Behavioral Response: Sharing  Type of Therapy:  Process Group  Summary of Progress: Patient arrived thirty minutes early and sat in the chair she needs to sit in to feel safe and avoid panic attacks. She shared with the group her need for routine and consistency to feel safe throughout her day. She appeared increasingly anxious when a new member entered the group room and sat next to her. The anxious responses increased rapidly until patient left the room to manage her anxiety. Patient struggled to express her anxiety to the group and eventually the group member intervened and asked member if she needed more physical space and she nodded. Patients anxiety and depression is very high and she is struggling to enact coping skills to reduce the symptoms. She shared privately with Clinical research associate that her husband got his job back, but is on probation. She is fearful due to his explosive tendencies that he may get fired in the future.      Group Time: 10:30 am - 12:00 pm   Participation Level:  Active  Behavioral Response: Appropriate  Type of Therapy: Psycho-education Group  Summary of Progress: Patient participated in a goodbye ceremony to a member ending the group today and practiced having healthy closure and communicating words of goodbye.   Carman Ching, LCSW

## 2012-04-08 ENCOUNTER — Other Ambulatory Visit (HOSPITAL_COMMUNITY): Payer: 59 | Admitting: Psychiatry

## 2012-04-08 DIAGNOSIS — F411 Generalized anxiety disorder: Secondary | ICD-10-CM

## 2012-04-08 DIAGNOSIS — F333 Major depressive disorder, recurrent, severe with psychotic symptoms: Secondary | ICD-10-CM
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
## 2012-04-09 ENCOUNTER — Other Ambulatory Visit (HOSPITAL_COMMUNITY): Payer: 59 | Admitting: Psychiatry

## 2012-04-09 DIAGNOSIS — F333 Major depressive disorder, recurrent, severe with psychotic symptoms: Secondary | ICD-10-CM

## 2012-04-09 DIAGNOSIS — F411 Generalized anxiety disorder: Secondary | ICD-10-CM
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
## 2012-04-12 ENCOUNTER — Other Ambulatory Visit (HOSPITAL_COMMUNITY): Payer: 59 | Admitting: Psychiatry

## 2012-04-12 DIAGNOSIS — F333 Major depressive disorder, recurrent, severe with psychotic symptoms: Secondary | ICD-10-CM

## 2012-04-12 DIAGNOSIS — F411 Generalized anxiety disorder: Secondary | ICD-10-CM
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
## 2012-04-13 ENCOUNTER — Other Ambulatory Visit (HOSPITAL_COMMUNITY): Payer: 59 | Admitting: Psychiatry

## 2012-04-13 DIAGNOSIS — F411 Generalized anxiety disorder: Secondary | ICD-10-CM

## 2012-04-13 DIAGNOSIS — F333 Major depressive disorder, recurrent, severe with psychotic symptoms: Secondary | ICD-10-CM

## 2012-04-14 ENCOUNTER — Other Ambulatory Visit (HOSPITAL_COMMUNITY): Payer: 59 | Admitting: Psychiatry

## 2012-04-14 DIAGNOSIS — F333 Major depressive disorder, recurrent, severe with psychotic symptoms: Secondary | ICD-10-CM

## 2012-04-14 DIAGNOSIS — F411 Generalized anxiety disorder: Secondary | ICD-10-CM

## 2012-04-14 MED ORDER — CLONAZEPAM 1 MG PO TABS: 1.0000 mg | ORAL_TABLET | Freq: Three times a day (TID) | ORAL | Status: DC

## 2012-04-14 NOTE — Progress Notes (Signed)
    Daily Group Progress Note  Program: IOP  Group Time: 9:00-10:30 am    Participation Level: Minimal  Behavioral Response: Appropriate  Type of Therapy:  Process Group  Summary of Progress: Patient reports getting sleep last night and appears more rested. She continues to struggle with severe anxiety and and moderate depression, but appears to be approaching baseline. She is still struggling with asserting her needs to manage anxiety triggers and is working to practice this skill and learn other coping skills.      Group Time: 10:30 am - 12:00 pm   Participation Level:  Active  Behavioral Response: Appropriate  Type of Therapy: Psycho-education Group  Summary of Progress: Patient learned part of the DBT skill of distress tolerance and how to identify if an anxiety trigger can be fixed how to fix it.  Carman Ching, LCSW
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
## 2012-04-14 NOTE — Addendum Note (Signed)
Addended by: Margit Banda D on: 04/14/2012 11:49 AM   Modules accepted: Orders

## 2012-04-15 ENCOUNTER — Ambulatory Visit (HOSPITAL_COMMUNITY): Payer: Self-pay | Admitting: Psychiatry

## 2012-04-15 ENCOUNTER — Other Ambulatory Visit (HOSPITAL_COMMUNITY): Payer: 59 | Admitting: Psychiatry

## 2012-04-15 DIAGNOSIS — F333 Major depressive disorder, recurrent, severe with psychotic symptoms: Secondary | ICD-10-CM

## 2012-04-15 DIAGNOSIS — F411 Generalized anxiety disorder: Secondary | ICD-10-CM

## 2012-04-15 NOTE — Progress Notes (Signed)
    Daily Group Progress Note  Program: IOP  Group Time: 9:00-10:30 am   Participation Level: Active  Behavioral Response: Appropriate  Type of Therapy:  Process Group  Summary of Progress: Patient talked more today and asserted herself to ask another member to move back some to reduce her anxiety level. She is practicing expressing herself as a means to anxiety reduction. She still reports high depression and anxiety and lack of sleep. She talked about the difficulty with her parents and best friend not supporting her with her depression and the expectations they have of her to be able to move past it.     Group Time: 10:30 am - 12:00 pm   Participation Level:  Active  Behavioral Response: Appropriate  Type of Therapy: Psycho-education Group  Summary of Progress: Patient learned the Distress Tolerance skills of "improve the moment and "self soothe" to manage difficult emotions.  [facility]

## 2012-04-16 ENCOUNTER — Other Ambulatory Visit (HOSPITAL_COMMUNITY): Payer: 59 | Admitting: Psychiatry

## 2012-04-16 DIAGNOSIS — F411 Generalized anxiety disorder: Secondary | ICD-10-CM

## 2012-04-16 DIAGNOSIS — F333 Major depressive disorder, recurrent, severe with psychotic symptoms: Secondary | ICD-10-CM

## 2012-04-16 MED ORDER — CLONAZEPAM 1 MG PO TABS: 1.0000 mg | ORAL_TABLET | Freq: Three times a day (TID) | ORAL | Status: DC

## 2012-04-16 NOTE — Progress Notes (Signed)
    Daily Group Progress Note  Program: IOP  Group Time: 9:00-10:30 am   Participation Level: Active  Behavioral Response: Appropriate  Type of Therapy:  Process Group  Summary of Progress: today is patients final day in the group. She has her hair styled and appears less anxious. She initially entered the group to work on depression and anxiety symptoms that were increasing. She said she has learned the importance of using the DBT skills she has been learning in individual therapy and said she had not really been using them as much as she should be. She said her depression and anxiety are at a manageable level and she feels ready to return to individual therapy.     Group Time: 10:30 am - 12:00 pm   Participation Level:  Active  Behavioral Response: Appropriate  Type of Therapy: Psycho-education Group  Summary of Progress: Patient learned the DBT distress tolerance skill of distraction and how to use it to manage difficult emotions.  [facility]

## 2012-04-16 NOTE — Patient Instructions (Signed)
Patient completed MH-IOP today.  Will follow up with Maxcine Ham, LCSW on 05-06-12 @ 2pm and Dr. Lolly Mustache on 05-14-12 @ 11 a.m..  Encouraged support groups.  Also, mentioned [facility].
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
## 2012-04-19 ENCOUNTER — Other Ambulatory Visit (HOSPITAL_COMMUNITY): Payer: 59

## 2012-04-20 ENCOUNTER — Other Ambulatory Visit (HOSPITAL_COMMUNITY): Payer: 59

## 2012-04-21 ENCOUNTER — Other Ambulatory Visit (HOSPITAL_COMMUNITY): Payer: 59
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
## 2012-04-23 NOTE — Addendum Note (Signed)
Addended by: Margit Banda D on: 04/23/2012 01:53 PM   Modules accepted: Orders

## 2012-04-26 ENCOUNTER — Ambulatory Visit (AMBULATORY_SURGERY_CENTER): Payer: 59 | Admitting: Gastroenterology

## 2012-04-26 ENCOUNTER — Encounter: Payer: Self-pay | Admitting: Gastroenterology

## 2012-04-26 VITALS — BP 126/78 | HR 68 | Temp 98.3°F | Resp 16 | Ht 65.0 in | Wt 221.0 lb

## 2012-04-26 DIAGNOSIS — Z8371 Family history of colonic polyps: Secondary | ICD-10-CM

## 2012-04-26 DIAGNOSIS — D126 Benign neoplasm of colon, unspecified: Secondary | ICD-10-CM

## 2012-04-26 DIAGNOSIS — Z800 Family history of malignant neoplasm of digestive organs: Secondary | ICD-10-CM

## 2012-04-26 DIAGNOSIS — K921 Melena: Secondary | ICD-10-CM

## 2012-04-26 MED ORDER — SODIUM CHLORIDE 0.9 % IV SOLN: 500.0000 mL | INTRAVENOUS | Status: DC

## 2012-04-26 NOTE — Patient Instructions (Addendum)
YOU HAD AN ENDOSCOPIC PROCEDURE TODAY AT THE [facility]: Refer to the procedure report that was given to you for any specific questions about what was found during the examination.  If the procedure report does not answer your questions, please call your gastroenterologist to clarify.  If you requested that your care partner not be given the details of your procedure findings, then the procedure report has been included in a sealed envelope for you to review at your convenience later.  YOU SHOULD EXPECT: Some feelings of bloating in the abdomen. Passage of more gas than usual.  Walking can help get rid of the air that was put into your GI tract during the procedure and reduce the bloating. If you had a lower endoscopy (such as a colonoscopy or flexible sigmoidoscopy) you may notice spotting of blood in your stool or on the toilet paper. If you underwent a bowel prep for your procedure, then you may not have a normal bowel movement for a few days.  DIET: Your first meal following the procedure should be a light meal and then it is ok to progress to your normal diet.  A half-sandwich or bowl of soup is an example of a good first meal.  Heavy or fried foods are harder to digest and may make you feel nauseous or bloated.  Likewise meals heavy in dairy and vegetables can cause extra gas to form and this can also increase the bloating.  Drink plenty of fluids but you should avoid alcoholic beverages for 24 hours.  ACTIVITY: Your care partner should take you home directly after the procedure.  You should plan to take it easy, moving slowly for the rest of the day.  You can resume normal activity the day after the procedure however you should NOT DRIVE or use heavy machinery for 24 hours (because of the sedation medicines used during the test).    SYMPTOMS TO REPORT IMMEDIATELY: A gastroenterologist can be reached at any hour.  During normal business hours, 8:30 AM to 5:00 PM Monday through Friday,  call [phone].  After hours and on weekends, please call the GI answering service at [phone] who will take a message and have the physician on call contact you.   Following lower endoscopy (colonoscopy or flexible sigmoidoscopy):  Excessive amounts of blood in the stool  Significant tenderness or worsening of abdominal pains  Swelling of the abdomen that is new, acute  Fever of 100F or higher   FOLLOW UP: If any biopsies were taken you will be contacted by phone or by letter within the next 1-3 weeks.  Call your gastroenterologist if you have not heard about the biopsies in 3 weeks.  Our staff will call the home number listed on your records the next business day following your procedure to check on you and address any questions or concerns that you may have at that time regarding the information given to you following your procedure. This is a courtesy call and so if there is no answer at the home number and we have not heard from you through the emergency physician on call, we will assume that you have returned to your regular daily activities without incident.  SIGNATURES/CONFIDENTIALITY: You and/or your care partner have signed paperwork which will be entered into your electronic medical record.  These signatures attest to the fact that that the information above on your After Visit Summary has been reviewed and is understood.  Full responsibility of the confidentiality of   this discharge information lies with you and/or your care-partner.  Polyps-handout given  Repeat colonoscopy in 5 years.  

## 2012-04-26 NOTE — Progress Notes (Signed)
Stable to RR Report to RN 

## 2012-04-26 NOTE — Progress Notes (Signed)
Called to room to assist during endoscopic procedure.  Patient ID and intended procedure confirmed with present staff. Received instructions for my participation in the procedure from the performing physician.  

## 2012-04-26 NOTE — Op Note (Signed)
[facility] [address] [place], [address]   COLONOSCOPY PROCEDURE REPORT  PATIENT: Katelyn [name], Katelyn [name]  MR#: [id number] BIRTHDATE: 1975/08/24 , 36  yrs. old GENDER: Female ENDOSCOPIST: Meryl Dare, MD, [facility] REFERRED [id number] Lodema Hong, M.D. PROCEDURE DATE:  04/26/2012 PROCEDURE:   Colonoscopy with snare polypectomy and Colonoscopy with biopsy ASA CLASS:   Class II INDICATIONS:hematochezia, Patient's family history of colon cancer-distant relative, Patient's family history of colon polyps-father. MEDICATIONS: MAC sedation, administered by CRNA and propofol (Diprivan) 300mg  IV DESCRIPTION OF PROCEDURE:   After the risks benefits and alternatives of the procedure were thoroughly explained, informed consent was obtained.  A digital rectal exam revealed no abnormalities of the rectum.   The LB [id number]  endoscope was introduced through the anus and advanced to the cecum, which was identified by both the appendix and ileocecal valve. No adverse events experienced.   The quality of the prep was excellent, using MoviPrep  The instrument was then slowly withdrawn as the colon was fully examined.  COLON FINDINGS: Two sessile polyps measuring 4 mm in size were found in the transverse colon and descending colon.  A polypectomy was performed with cold forceps.  The resection was complete and the polyp tissue was completely retrieved.   A semi-pedunculated polyp measuring 6 mm in size was found in the rectum.  A polypectomy was performed with a cold snare.  The resection was complete and the polyp tissue was completely retrieved.   The colon was otherwise normal.  There was no diverticulosis, inflammation, polyps or cancers unless previously stated.  Retroflexed views revealed no abnormalities. The time to cecum=3 minutes 35 seconds.  Withdrawal time=13 minutes 37 seconds.  The scope was withdrawn and the procedure completed.  COMPLICATIONS: There were no  complications.  ENDOSCOPIC IMPRESSION: 1.   Two sessile polyps measuring 4 mm in the transverse and descending colon; polypectomy performed with cold forceps 2.   Semi-pedunculated polyp measuring 6 mm in the rectum; polypectomy performed with a cold snare 3.   The colon was otherwise normal  RECOMMENDATIONS: 1.  Await pathology results 2.  Repeat Colonoscopy in 5 years.  eSigned:  Meryl Dare, MD, [facility] 04/26/2012 10:17 AM

## 2012-04-26 NOTE — Progress Notes (Signed)
Patient did not experience any of the following events: a burn prior to discharge; a fall within the facility; wrong site/side/patient/procedure/implant event; or a hospital transfer or hospital admission upon discharge from the facility. (G8907) Patient did not have preoperative order for IV antibiotic SSI prophylaxis. (G8918)  

## 2012-04-27 ENCOUNTER — Telehealth: Payer: Self-pay | Admitting: *Deleted

## 2012-04-27 NOTE — Telephone Encounter (Signed)
Left message on number given in admitting yesterday. ewm 

## 2012-04-29 ENCOUNTER — Ambulatory Visit (HOSPITAL_COMMUNITY): Payer: Self-pay | Admitting: Psychiatry

## 2012-05-03 ENCOUNTER — Encounter: Payer: Self-pay | Admitting: Gastroenterology

## 2012-05-04 ENCOUNTER — Ambulatory Visit (INDEPENDENT_AMBULATORY_CARE_PROVIDER_SITE_OTHER): Payer: 59 | Admitting: Psychiatry

## 2012-05-04 ENCOUNTER — Encounter (HOSPITAL_COMMUNITY): Payer: Self-pay | Admitting: Psychiatry

## 2012-05-04 DIAGNOSIS — F321 Major depressive disorder, single episode, moderate: Secondary | ICD-10-CM

## 2012-05-04 DIAGNOSIS — F323 Major depressive disorder, single episode, severe with psychotic features: Secondary | ICD-10-CM

## 2012-05-04 MED ORDER — FLUOXETINE HCL 10 MG PO CAPS: ORAL_CAPSULE | ORAL | Status: DC
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
## 2012-05-06 ENCOUNTER — Encounter (HOSPITAL_COMMUNITY): Payer: Self-pay | Admitting: *Deleted

## 2012-05-06 ENCOUNTER — Ambulatory Visit (INDEPENDENT_AMBULATORY_CARE_PROVIDER_SITE_OTHER): Payer: 59 | Admitting: Psychiatry

## 2012-05-06 ENCOUNTER — Inpatient Hospital Stay (HOSPITAL_COMMUNITY)
Admission: RE | Admit: 2012-05-06 | Discharge: 2012-05-14 | DRG: 885 | Disposition: A | Discharge status: Home / Self Care | Payer: 59 | Attending: Psychiatry | Admitting: Psychiatry

## 2012-05-06 DIAGNOSIS — R42 Dizziness and giddiness: Secondary | ICD-10-CM

## 2012-05-06 DIAGNOSIS — E119 Type 2 diabetes mellitus without complications: Secondary | ICD-10-CM | POA: Diagnosis present

## 2012-05-06 DIAGNOSIS — F333 Major depressive disorder, recurrent, severe with psychotic symptoms: Principal | ICD-10-CM

## 2012-05-06 DIAGNOSIS — F411 Generalized anxiety disorder: Secondary | ICD-10-CM

## 2012-05-06 DIAGNOSIS — E282 Polycystic ovarian syndrome: Secondary | ICD-10-CM

## 2012-05-06 DIAGNOSIS — R002 Palpitations: Secondary | ICD-10-CM

## 2012-05-06 DIAGNOSIS — Z83719 Family history of colon polyps, unspecified: Secondary | ICD-10-CM

## 2012-05-06 DIAGNOSIS — F329 Major depressive disorder, single episode, unspecified: Secondary | ICD-10-CM

## 2012-05-06 DIAGNOSIS — R030 Elevated blood-pressure reading, without diagnosis of hypertension: Secondary | ICD-10-CM

## 2012-05-06 DIAGNOSIS — E669 Obesity, unspecified: Secondary | ICD-10-CM

## 2012-05-06 DIAGNOSIS — F3289 Other specified depressive episodes: Secondary | ICD-10-CM

## 2012-05-06 DIAGNOSIS — F509 Eating disorder, unspecified: Secondary | ICD-10-CM

## 2012-05-06 DIAGNOSIS — Z85828 Personal history of other malignant neoplasm of skin: Secondary | ICD-10-CM

## 2012-05-06 DIAGNOSIS — R7301 Impaired fasting glucose: Secondary | ICD-10-CM

## 2012-05-06 DIAGNOSIS — R45851 Suicidal ideations: Secondary | ICD-10-CM

## 2012-05-06 DIAGNOSIS — H547 Unspecified visual loss: Secondary | ICD-10-CM

## 2012-05-06 DIAGNOSIS — Z79899 Other long term (current) drug therapy: Secondary | ICD-10-CM

## 2012-05-06 DIAGNOSIS — R5383 Other fatigue: Secondary | ICD-10-CM

## 2012-05-06 DIAGNOSIS — Z8371 Family history of colonic polyps: Secondary | ICD-10-CM

## 2012-05-06 HISTORY — DX: Polyp of colon: K63.5

## 2012-05-06 LAB — COMPREHENSIVE METABOLIC PANEL
Albumin: 4 g/dL (ref 3.5–5.2)
Alkaline Phosphatase: 93 U/L (ref 39–117)
BUN: 16 mg/dL (ref 6–23)
Potassium: 3.8 mEq/L (ref 3.5–5.1)
Sodium: 135 mEq/L (ref 135–145)
Total Protein: 7.8 g/dL (ref 6.0–8.3)

## 2012-05-06 LAB — URINE MICROSCOPIC-ADD ON

## 2012-05-06 LAB — URINALYSIS, ROUTINE W REFLEX MICROSCOPIC
Glucose, UA: NEGATIVE mg/dL
Ketones, ur: NEGATIVE mg/dL
Specific Gravity, Urine: 1.019 (ref 1.005–1.030)
pH: 5.5 (ref 5.0–8.0)

## 2012-05-06 LAB — CBC
HCT: 41.9 % (ref 36.0–46.0)
Hemoglobin: 14.3 g/dL (ref 12.0–15.0)
MCV: 92.1 fL (ref 78.0–100.0)
RDW: 13.1 % (ref 11.5–15.5)
WBC: 11.8 10*3/uL — ABNORMAL HIGH (ref 4.0–10.5)

## 2012-05-06 LAB — PREGNANCY, URINE: Preg Test, Ur: NEGATIVE

## 2012-05-06 MED ORDER — NORTRIPTYLINE HCL 25 MG PO CAPS
100.0000 mg | ORAL_CAPSULE | Freq: Every day | ORAL | Status: DC
  Filled 2012-05-06: qty 1
  Filled 2012-05-06 (×2): qty 4

## 2012-05-06 MED ORDER — CLONAZEPAM 1 MG PO TABS
1.0000 mg | ORAL_TABLET | Freq: Three times a day (TID) | ORAL | Status: DC
  Administered 2012-05-06: 1 mg via ORAL
  Filled 2012-05-06: qty 1

## 2012-05-06 MED ORDER — FLUOXETINE HCL 10 MG PO CAPS
10.0000 mg | ORAL_CAPSULE | Freq: Every day | ORAL | Status: DC
  Filled 2012-05-06 (×2): qty 1

## 2012-05-06 MED ORDER — MAGNESIUM HYDROXIDE 400 MG/5ML PO SUSP
30.0000 mL | Freq: Every day | ORAL | Status: DC | PRN
  Administered 2012-05-11 – 2012-05-13 (×2): 30 mL via ORAL

## 2012-05-06 MED ORDER — HYDROCORTISONE ACE-PRAMOXINE 1-1 % RE CREA
TOPICAL_CREAM | Freq: Two times a day (BID) | RECTAL | Status: DC
  Filled 2012-05-06: qty 30

## 2012-05-06 MED ORDER — FLUOXETINE HCL 10 MG PO CAPS
10.0000 mg | ORAL_CAPSULE | Freq: Every day | ORAL | Status: DC
  Administered 2012-05-07: 10 mg via ORAL
  Filled 2012-05-06 (×2): qty 1

## 2012-05-06 MED ORDER — ACETAMINOPHEN 325 MG PO TABS
650.0000 mg | ORAL_TABLET | Freq: Four times a day (QID) | ORAL | Status: DC | PRN
  Administered 2012-05-06: 650 mg via ORAL

## 2012-05-06 MED ORDER — CLONAZEPAM 1 MG PO TABS
1.0000 mg | ORAL_TABLET | Freq: Three times a day (TID) | ORAL | Status: DC
  Administered 2012-05-06 – 2012-05-07 (×3): 1 mg via ORAL
  Filled 2012-05-06 (×3): qty 1

## 2012-05-06 MED ORDER — DOCUSATE SODIUM 100 MG PO CAPS
200.0000 mg | ORAL_CAPSULE | Freq: Two times a day (BID) | ORAL | Status: DC
  Administered 2012-05-06: 200 mg via ORAL
  Filled 2012-05-06 (×3): qty 2
  Filled 2012-05-06: qty 1

## 2012-05-06 MED ORDER — ALUM & MAG HYDROXIDE-SIMETH 200-200-20 MG/5ML PO SUSP: 30.0000 mL | ORAL | Status: DC | PRN

## 2012-05-06 MED ORDER — ZIPRASIDONE HCL 80 MG PO CAPS
80.0000 mg | ORAL_CAPSULE | Freq: Every day | ORAL | Status: DC
  Administered 2012-05-07 – 2012-05-13 (×7): 80 mg via ORAL
  Filled 2012-05-06 (×9): qty 1

## 2012-05-06 MED ORDER — DOCUSATE SODIUM 100 MG PO CAPS
200.0000 mg | ORAL_CAPSULE | Freq: Two times a day (BID) | ORAL | Status: DC
  Administered 2012-05-07 – 2012-05-14 (×15): 200 mg via ORAL
  Filled 2012-05-06 (×6): qty 2
  Filled 2012-05-06: qty 1
  Filled 2012-05-06 (×11): qty 2

## 2012-05-06 MED ORDER — METFORMIN HCL ER 500 MG PO TB24
500.0000 mg | ORAL_TABLET | Freq: Two times a day (BID) | ORAL | Status: DC
  Administered 2012-05-07 – 2012-05-14 (×15): 500 mg via ORAL
  Filled 2012-05-06 (×18): qty 1

## 2012-05-06 NOTE — Progress Notes (Signed)
Psychoeducational Group Note  Date:  05/06/2012 Time:  2000  Group Topic/Focus:  Karaoke  Participation Level:  Did Not Attend  Participation Quality:    Affect:    Cognitive:    Insight:    Engagement in Group:    Additional Comments:    Humberto Seals Monique 05/06/2012, 10:56 PM

## 2012-05-06 NOTE — Progress Notes (Signed)
   THERAPIST PROGRESS NOTE  Session Time: 2:00-3:00 pm  Participation Level: Active  Behavioral Response: CasualAlertDepressed  Type of Therapy: Individual Therapy  Treatment Goals addressed: Coping  Interventions: Other: inpatient assessment  Summary: Katelyn [name] is a 37 y.o. female who presents with severe depressed mood and flat affect. Patient requested inpatient hospitalization after exploring the severity of her depression symptoms. She states she met with Dr. Lolly Mustache this week and they considered the possibility of inpatient at that session but patient was stable enough to go home. Patient states she feels hopeless and helpless and wishes she were dead. She has had thoughts to cut her wrists at moments when the depression worsens. Patient has insight into how her depression worsens when her anxiety subsides and also that her depression becomes unmanagable when her period cycle starts. Patient was taking into the hospital during this session.   Suicidal/Homicidal: Katelyn [name] intent/plan  Therapist Response: Assessed level of depression, completed suicide assessment and referred patient for inpatient hospitalization.  Plan: Return again after discharge from the hospital  Diagnosis: Axis I: Depression, Major, Recurrent, Severe with psychosis    Axis II: No diagnosis    Caira Poche E, LCSW 05/06/2012
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
## 2012-05-06 NOTE — Tx Team (Signed)
Initial Interdisciplinary Treatment Plan  PATIENT STRENGTHS: (choose at least two) Ability for insight Average or above average intelligence Communication skills General fund of knowledge Supportive family/friends  PATIENT STRESSORS: Marital or family conflict Medication change or noncompliance   PROBLEM LIST: Problem List/Patient Goals Date to be addressed Date deferred Reason deferred Estimated date of resolution  SI      Depression                                                 DISCHARGE CRITERIA:  Improved stabilization in mood, thinking, and/or behavior Need for constant or close observation no longer present  PRELIMINARY DISCHARGE PLAN: Attend aftercare/continuing care group Attend PHP/IOP  PATIENT/FAMIILY INVOLVEMENT: This treatment plan has been presented to and reviewed with the patient, RIKO LUMSDEN, and/or family member, .  The patient and family have been given the opportunity to ask questions and make suggestions.  Noah Charon 05/06/2012, 6:21 PM
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
## 2012-05-07 MED ORDER — FLUOXETINE HCL 20 MG PO CAPS
20.0000 mg | ORAL_CAPSULE | Freq: Every day | ORAL | Status: DC
  Administered 2012-05-08 – 2012-05-10 (×3): 20 mg via ORAL
  Filled 2012-05-07 (×4): qty 1

## 2012-05-07 MED ORDER — TRAZODONE HCL 50 MG PO TABS
150.0000 mg | ORAL_TABLET | Freq: Every evening | ORAL | Status: DC | PRN
  Administered 2012-05-07 – 2012-05-13 (×7): 150 mg via ORAL
  Filled 2012-05-07: qty 1
  Filled 2012-05-07 (×5): qty 3
  Filled 2012-05-07: qty 2

## 2012-05-07 MED ORDER — NORTRIPTYLINE HCL 25 MG PO CAPS
50.0000 mg | ORAL_CAPSULE | Freq: Every day | ORAL | Status: DC
  Administered 2012-05-07 – 2012-05-09 (×3): 50 mg via ORAL
  Filled 2012-05-07 (×4): qty 2

## 2012-05-07 MED ORDER — IBUPROFEN 200 MG PO TABS
400.0000 mg | ORAL_TABLET | ORAL | Status: DC | PRN
  Administered 2012-05-07 – 2012-05-13 (×4): 400 mg via ORAL
  Filled 2012-05-07 (×4): qty 2

## 2012-05-07 MED ORDER — CLONAZEPAM 1 MG PO TABS
1.0000 mg | ORAL_TABLET | Freq: Three times a day (TID) | ORAL | Status: DC
  Administered 2012-05-07 – 2012-05-13 (×18): 1 mg via ORAL
  Filled 2012-05-07 (×18): qty 1

## 2012-05-07 NOTE — Progress Notes (Signed)
Psychoeducational Group Note  Date:  05/07/2012 Time:  1100  Group Topic/Focus:  Early Warning Signs:   The focus of this group is to help patients identify signs or symptoms they exhibit before slipping into an unhealthy state or crisis.  Participation Level:  Active  Participation Quality:  Appropriate  Affect:  Appropriate  Cognitive:  Appropriate  Insight:  Engaged  Engagement in Group:  Engaged  Additional Comments:   Katelyn [name] 05/07/2012, 1:57 PM

## 2012-05-07 NOTE — Progress Notes (Signed)
[facility] LCSW Aftercare Discharge Planning Group Note  05/07/2012 11:36 AM  Participation Quality:  Attentive  Affect:  Blunted, Depressed, Excited and Tearful  Cognitive:  Appropriate  Insight:  Developing/Improving  Engagement in Group:  Developing/Improving  Modes of Intervention:  Discussion, Exploration, Problem-solving and Support  Summary of Progress/Problems:  Patient endorses SI but able to contract for safety.  She also endorses depression, helplessness and hopelessness all rated at nine.  Wynn Banker 05/07/2012, 11:36 AM

## 2012-05-07 NOTE — Progress Notes (Signed)
Patient ID: Katelyn [name], female   DOB: [birth year], 37 y.o.   MRN: [id number] D: pt. Sitting in room with lights off. Pt. Does speak to Clinical research associate and reports depression at "10" of 10. Pt. Reports "stressful life overall". "Christmas day I almost committed suicide" "I think my medication need changing"  "I don't hear voices I hear door bell, TV's on, but it not on, not all the time" A: Writer introduces self to pt. Review meds and importance of eating to avoid GI upset with meds. Staff will monitor q70min for safety. Writer encourages group. Pt. Refuses other meds but takes Klonopin. R: Pt. Is safe on the unit. Pt. Refuses group, "I don't like music. Pt. Refuse snack.

## 2012-05-07 NOTE — Tx Team (Signed)
Interdisciplinary Treatment Plan Update (Adult)  Date:  05/07/2012  Time Reviewed:  10:01 AM   Progress in Treatment: Attending groups:   Yes   Participating in groups:  Yes Taking medication as prescribed:  Yes Tolerating medication:  Yes Family/Significant othe contact made: Contact to be made with family Patient understands diagnosis:  Yes Discussing patient identified problems/goals with staff: Yes Medical problems stabilized or resolved: Yes Denies suicidal/homicidal ideation:No but contracts for safety Issues/concerns per patient self-inventory:  Other:   New problem(s) identified:  Reason for Continuation of Hospitalization: Anxiety Depression Medication stabilization Suicidal ideation  Interventions implemented related to continuation of hospitalization:  Medication Management; safety checks q 15 mins  Additional comments:  Estimated length of stay:  3-5 days  Discharge Plan:  Home with outpatient follow up  New goal(s):  Review of initial/current patient goals per problem list:    1.  Goal(s): Eliminate SI/other thoughts of self harm   Met:  No  Target date: d/c  As evidenced by: Patient will no longer endorse SI/HI or other thoughts of self harm.    2.  Goal (s):Reduce depression/anxiety (Rated at ten today  Met: No  Target date: d/c  As evidenced by: Patient will rate symptoms at four or below    3.  Goal(s):.stabilize on meds   Met:  No  Target date: d/c  As evidenced by: Patient will report being stabilized on medications - less symptomatic    4.  Goal(s): Refer for outpatient follow up   Met:  No  Target date: d/c  As evidenced by: Follow up appointment will bescheduled    Attendees: Patient:   05/07/2012 10:01 AM  Physican:  Patrick North, MD 05/07/2012 10:01 AM  Nursing:  Berneice Heinrich, RN 05/07/2012 10:01 AM   Nursing:   Marylu Lund We bb, RN 05/07/2012 10:01 AM   Clinical Social Worker:  Juline Patch, LCSW 05/07/2012 10:01  AM   Other: Tera Helper, PHM-NP 05/07/2012 10:01 AM   Other:         05/07/2012 10:01 AM Other:        05/07/2012 10:01 AM

## 2012-05-07 NOTE — Clinical Social Work Note (Signed)
[facility] LCSW Group Therapy            05/07/2012 3:59 PM    Type of Therapy:  Group Therapy  Participation Level:  Appropriate  Participation Quality:  Appropriate  Affect:  Appropriate  Cognitive:  Attentive Appropriate  Insight:  Engaged  Engagement in Therapy:  Engaged  Modes of Intervention:  Discussion Exploration Problem-Solving Supportive  Summary of Progress/Problems:  Katelyn [name] shared she relapses when she withdraws from others.  She shared her neighbor knows she is not doing well when she does not open her blinds.    Wynn Banker 05/07/2012 3:59 PM

## 2012-05-07 NOTE — Progress Notes (Addendum)
Pt came to the nurses window this am complaining of her medications.She stated she needed trazadone 150mg  and must be on 200mg  of colace. In addition pt had been reading where clonidine needed to be given 30 minutes before meals.Assured pt that this would be discussed with the MD. Pt states she has not been sleeping and has very low energy. Pt. Has been going to group but appears very blunted in her affect and labile as well. No Si or Hi and contracts for safety. Pt does not appear to interact with the other pts. And at times is teary eyed.
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
## 2012-05-07 NOTE — H&P (Signed)
Psychiatric Admission Assessment Adult  Patient Identification:  Katelyn [name] Date of Evaluation:  05/07/2012 Chief Complaint:  MDD  Physical Exam  Constitutional: She is oriented to person, place, and time. She appears well-developed and well-nourished.  HENT:  Head: Normocephalic and atraumatic.  Right Ear: External ear normal.  Left Ear: External ear normal.  Nose: Nose normal.  Mouth/Throat: Oropharynx is clear and moist.  Eyes: Conjunctivae normal and EOM are normal. Pupils are equal, round, and reactive to light.  Neck: Normal range of motion. Neck supple.  Cardiovascular: Normal rate, regular rhythm, normal heart sounds and intact distal pulses.   Respiratory: Effort normal and breath sounds normal.  GI: Soft. Bowel sounds are normal.  Genitourinary:       Deferred, denies issues  Musculoskeletal: Normal range of motion.  Neurological: She is alert and oriented to person, place, and time. She has normal reflexes.  Skin: Skin is warm and dry.    Review of Systems  Constitutional: Negative.   Eyes: Negative.   Respiratory: Negative.   Cardiovascular: Negative.   Gastrointestinal: Negative.   Genitourinary: Negative.   Musculoskeletal: Negative.   Skin: Negative.   Neurological: Negative.   Endo/Heme/Allergies: Negative.   Psychiatric/Behavioral: Positive for depression. The patient is nervous/anxious and has insomnia.     Blood pressure 133/85, pulse 93, temperature 98.5 F (36.9 C), temperature source Oral, resp. rate 16, height 5\' 6"  (1.676 m), weight 98.884 kg (218 lb), last menstrual period 04/21/2012, SpO2 100.00%.Body mass index is 35.19 kg/(m^2).    Past Medical History:   Past Medical History  Diagnosis Date  . Obesity   . Skin lesion     Excisional biopsy  . Depression     several suicide attempts, hospitaluzed in 2012 for this  . Diabetes mellitus type II   . Dizziness - light-headed   . Sleep apnea     Mild; moderate periodic limb movement  disorder  . Polycystic ovary   . Anxiety   . Chronic headaches   . IBS (irritable bowel syndrome)   . History of pneumonia     x 3  . Benign juvenile melanoma   . Colon polyps     found on colonoscopy 04/26/2012    Family History:   Family History  Problem Relation Age of Onset  . Hypertension Mother     Iterstitial Cystist  . Hyperlipidemia Mother   . Depression Brother   . Alcohol abuse Brother   . Colon polyps Father   . Depression Father   . Irritable bowel syndrome Father   . Colon cancer Paternal Aunt 78  . Heart attack Paternal Grandfather   . Kidney cancer Paternal Grandfather   . Cancer Maternal Grandfather     unknown type    Results for orders placed during the hospital encounter of 05/06/12 (from the past 72 hour(s))  URINALYSIS, ROUTINE W REFLEX MICROSCOPIC     Status: Abnormal   Collection Time   05/06/12  6:11 PM      Component Value Range Comment   Color, Urine YELLOW  YELLOW    APPearance CLOUDY (*) CLEAR    Specific Gravity, Urine 1.019  1.005 - 1.030    pH 5.5  5.0 - 8.0    Glucose, UA NEGATIVE  NEGATIVE mg/dL    Hgb urine dipstick NEGATIVE  NEGATIVE    Bilirubin Urine NEGATIVE  NEGATIVE    Ketones, ur NEGATIVE  NEGATIVE mg/dL    Protein, ur NEGATIVE  NEGATIVE mg/dL  Urobilinogen, UA 0.2  0.0 - 1.0 mg/dL    Nitrite NEGATIVE  NEGATIVE    Leukocytes, UA SMALL (*) NEGATIVE   PREGNANCY, URINE     Status: Normal   Collection Time   05/06/12  6:11 PM      Component Value Range Comment   Preg Test, Ur NEGATIVE  NEGATIVE   URINE MICROSCOPIC-ADD ON     Status: Abnormal   Collection Time   05/06/12  6:11 PM      Component Value Range Comment   Squamous Epithelial / LPF FEW (*) RARE    WBC, UA 3-6  <3 WBC/hpf    Bacteria, UA RARE  RARE   COMPREHENSIVE METABOLIC PANEL     Status: Abnormal   Collection Time   05/06/12  7:54 PM      Component Value Range Comment   Sodium 135  135 - 145 mEq/L    Potassium 3.8  3.5 - 5.1 mEq/L    Chloride 98  96 - 112  mEq/L    CO2 28  19 - 32 mEq/L    Glucose, Bld 85  70 - 99 mg/dL    BUN 16  6 - 23 mg/dL    Creatinine, Ser 1.61  0.50 - 1.10 mg/dL    Calcium 9.3  8.4 - 09.6 mg/dL    Total Protein 7.8  6.0 - 8.3 g/dL    Albumin 4.0  3.5 - 5.2 g/dL    AST 21  0 - 37 U/L    ALT 16  0 - 35 U/L    Alkaline Phosphatase 93  39 - 117 U/L    Total Bilirubin 0.2 (*) 0.3 - 1.2 mg/dL    GFR calc non Af Amer 87 (*) >90 mL/min    GFR calc Af Amer >90  >90 mL/min   CBC     Status: Abnormal   Collection Time   05/06/12  7:54 PM      Component Value Range Comment   WBC 11.8 (*) 4.0 - 10.5 K/uL    RBC 4.55  3.87 - 5.11 MIL/uL    Hemoglobin 14.3  12.0 - 15.0 g/dL    HCT 04.5  40.9 - 81.1 %    MCV 92.1  78.0 - 100.0 fL    MCH 31.4  26.0 - 34.0 pg    MCHC 34.1  30.0 - 36.0 g/dL    RDW 91.4  78.2 - 95.6 %    Platelets 316  150 - 400 K/uL   HEMOGLOBIN A1C     Status: Normal   Collection Time   05/06/12  7:54 PM      Component Value Range Comment   Hemoglobin A1C 5.2  <5.7 %    Mean Plasma Glucose 103  <117 mg/dL    Psychological Evaluations:   Past Medical History  Diagnosis Date  . Obesity   . Skin lesion     Excisional biopsy  . Depression     several suicide attempts, hospitaluzed in 2012 for this  . Diabetes mellitus type II   . Dizziness - light-headed   . Sleep apnea     Mild; moderate periodic limb movement disorder  . Polycystic ovary   . Anxiety   . Chronic headaches   . IBS (irritable bowel syndrome)   . History of pneumonia     x 3  . Benign juvenile melanoma   . Colon polyps     found on colonoscopy 04/26/2012  Current Facility-Administered Medications  Medication Dose Route Frequency Provider Last Rate Last Dose  . acetaminophen (TYLENOL) tablet 650 mg  650 mg Oral Q6H PRN Rachael Fee, MD   650 mg at 05/06/12 1953  . alum & mag hydroxide-simeth (MAALOX/MYLANTA) 200-200-20 MG/5ML suspension 30 mL  30 mL Oral Q4H PRN Rachael Fee, MD      . clonazePAM Scarlette Calico) tablet 1 mg  1  mg Oral TID Himabindu Ravi, MD   1 mg at 05/07/12 1210  . docusate sodium (COLACE) capsule 200 mg  200 mg Oral BID Himabindu Ravi, MD   200 mg at 05/07/12 1610  . FLUoxetine (PROZAC) capsule 20 mg  20 mg Oral Daily Himabindu Ravi, MD      . magnesium hydroxide (MILK OF MAGNESIA) suspension 30 mL  30 mL Oral Daily PRN Rachael Fee, MD      . metFORMIN (GLUCOPHAGE-XR) 24 hr tablet 500 mg  500 mg Oral BID WC Rachael Fee, MD   500 mg at 05/07/12 9604  . nortriptyline (PAMELOR) capsule 50 mg  50 mg Oral QHS Himabindu Ravi, MD      . pramoxine-hydrocortisone (PROCTOCREAM-HC) rectal cream   Rectal BID Himabindu Ravi, MD      . traZODone (DESYREL) tablet 150 mg  150 mg Oral QHS PRN Himabindu Ravi, MD      . ziprasidone (GEODON) capsule 80 mg  80 mg Oral QAC supper Rachael Fee, MD        I certify that inpatient services furnished can reasonably be expected to improve the patient's condition.   Nanine Means, PMH-NP 1/10/20143:41 PM
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
## 2012-05-07 NOTE — Progress Notes (Signed)
Psychoeducational Group Note  Date:  05/07/2012 Time:  2000  Group Topic/Focus:  Wrap-Up Group:   The focus of this group is to help patients review their daily goal of treatment and discuss progress on daily workbooks.  Participation Level:  Active  Participation Quality:  Appropriate  Affect:  Anxious, Blunted and Depressed  Cognitive:  Appropriate  Insight:  Limited  Engagement in Group:  Lacking  Additional Comments:  Patient shared that she was able to speak with her doctor about her needs today which was a positive move forward for her.  Jahaad Penado, [place] Pigg 05/07/2012, 9:20 PM
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
## 2012-05-08 DIAGNOSIS — F332 Major depressive disorder, recurrent severe without psychotic features: Secondary | ICD-10-CM

## 2012-05-08 MED ORDER — ONDANSETRON 4 MG PO TBDP
4.0000 mg | ORAL_TABLET | Freq: Three times a day (TID) | ORAL | Status: DC | PRN
  Administered 2012-05-08 – 2012-05-14 (×3): 4 mg via ORAL
  Filled 2012-05-08: qty 1

## 2012-05-08 MED ORDER — ZIPRASIDONE HCL 20 MG PO CAPS
20.0000 mg | ORAL_CAPSULE | ORAL | Status: DC
  Administered 2012-05-08 – 2012-05-14 (×7): 20 mg via ORAL
  Filled 2012-05-08 (×8): qty 1

## 2012-05-08 MED ORDER — ONDANSETRON 4 MG PO TBDP
ORAL_TABLET | ORAL | Status: AC
  Filled 2012-05-08: qty 1

## 2012-05-08 NOTE — Progress Notes (Signed)
D: Presents anxious and depressed. Calm and cooperative with assessment. No acute distress noted. States she continues to feel anxious in social situations. States she gets anxious in cafeteria (MD aware and treating) but does ok in groups if the sits in the "right seat". Rates her Depression and hopelessness at a 10 and endorses low energy with poor concentration. She endorses passive SI with no plan as well as AH (buzzing noises and music). She denies HI and VH. She contracts for safety. She did c/o nausea and MD wrote order for prn to address. Otherwise offers no questions or concerns.   A: Safety has been maintained with q15 minute observation. Support and encouragement provided. Medications and POC for the shift reviewed and understanding verbalized. Medications given as ordered by MD. PRN provided per her request.   R: Pt remains safe. She is anxious and depressed, but cooperative. She is attending groups with minimal participation and interaction. She offers no additional questions or concerns. Will continue current POC, Q15 minute observation and will f/u response to prn.

## 2012-05-08 NOTE — Progress Notes (Signed)
Date: 05/08/2012  Time: 0900  Group Topic/Focus:  Goal setting group  Participation Level: Attended Participation Quality: Little engagement  Affect: flat and depressed Cognitive: wnl  Insight: limited  Engagement in Group: Lacking Additional Comments: .

## 2012-05-08 NOTE — Clinical Social Work Psychosocial (Signed)
[facility] Group Notes:  (Clinical Social Work)  05/08/2012   3:00-4:00PM  Summary of Progress/Problems:   The main focus of today's process group was for the patient to identify ways in which they have in the past sabotaged their own recovery and reasons they may have done this/what they received from doing it.  We then worked to identify a specific plan to avoid doing this when discharged from the hospital for this admission.  The discussion evolved into diagnosis education, and a concern about advocating for oneself as a patient.  In response to patient concerns, CSW encouraged patients to know about their diagnoses as well as prescribed medications, and informed them they could obtain written material from their RN.  The patient expressed almost nothing verbally, but was attentive.  Type of Therapy:  Group Therapy - Process  Participation Level:  Minimal  Participation Quality:  Attentive  Affect:  Anxious and Depressed  Cognitive:  Oriented  Insight:  Limited  Engagement in Therapy:  Limited  Modes of Intervention:  Clarification, Education, Limit-setting, Problem-solving, Socialization, Support and Processing, Exploration, Discussion   Ambrose Mantle, LCSW 05/08/2012, 5:25 PM

## 2012-05-08 NOTE — Progress Notes (Signed)
[facility] Group Notes:  (Counselor/Nursing/MHT/Case Management/Adjunct)  05/08/2012 11:19 PM  Type of Therapy:  Psychoeducational Skills  Participation Level:  Active  Participation Quality:  Attentive  Affect:  Appropriate  Cognitive:  Appropriate  Insight:  Improving  Engagement in Group:  Engaged  Engagement in Therapy:  Engaged  Modes of Intervention:  Education  Summary of Progress/Problems: The patient described her day as being "okay". She mentioned in group that she was able to go to the cafeteria with her peers. Her goal for tomorrow is to have a good day.    Hazle Coca S 05/08/2012, 11:19 PM

## 2012-05-08 NOTE — Progress Notes (Signed)
[facility] MD Progress Note  05/08/2012 10:16 AM Katelyn [name]  MRN:  [id number] Subjective:  I still feel depressed.  I have not eaten anything in past 2 days.  I cannot sleep well.  I feel weak and tired.  I get very anxious around people.  Today I have taken Klonopin half an hour earlier than breakfast and I was able to eat some food.  I still hear voices. Diagnosis:  Axis I: Major Depression, Recurrent severe  ADL's:  Intact  Sleep: Poor  Appetite:  Poor  Suicidal Ideation:  Plan:  Patient still has passive suicidal thinking but denies any current plan. Homicidal Ideation:  Plan:  Denies any homicidal thoughts or plan. AEB (as evidenced by):  Psychiatric Specialty Exam: Review of Systems  Gastrointestinal: Positive for nausea.  Neurological: Positive for weakness.  Psychiatric/Behavioral: Positive for depression and suicidal ideas. The patient is nervous/anxious and has insomnia.     Blood pressure 119/83, pulse 101, temperature 97.2 F (36.2 C), temperature source Oral, resp. rate 16, height 5\' 6"  (1.676 m), weight 98.884 kg (218 lb), last menstrual period 04/21/2012, SpO2 100.00%.Body mass index is 35.19 kg/(m^2).  General Appearance: Fairly Groomed and Guarded  Patent attorney::  Fair  Speech:  Slow  Volume:  Decreased  Mood:  Anxious, Depressed and Hopeless  Affect:  Blunt and Flat  Thought Process:  Linear and Logical  Orientation:  Full (Time, Place, and Person)  Thought Content:  Rumination  Suicidal Thoughts:  Yes.  without intent/plan  Homicidal Thoughts:  No  Memory:  Intact  Judgement:  Fair  Insight:  Fair  Psychomotor Activity:  Decreased  Concentration:  Fair  Recall:  Fair  Akathisia:  No  Handed:  Right  AIMS (if indicated):     Assets:  Housing Physical Health Social Support  Sleep:  Number of Hours: 6.5    Current Medications: Current Facility-Administered Medications  Medication Dose Route Frequency Provider Last Rate Last Dose  . acetaminophen  (TYLENOL) tablet 650 mg  650 mg Oral Q6H PRN Rachael Fee, MD   650 mg at 05/06/12 1953  . alum & mag hydroxide-simeth (MAALOX/MYLANTA) 200-200-20 MG/5ML suspension 30 mL  30 mL Oral Q4H PRN Rachael Fee, MD      . clonazePAM Scarlette Calico) tablet 1 mg  1 mg Oral TID AC Himabindu Ravi, MD   1 mg at 05/08/12 0606  . docusate sodium (COLACE) capsule 200 mg  200 mg Oral BID Himabindu Ravi, MD   200 mg at 05/08/12 0807  . FLUoxetine (PROZAC) capsule 20 mg  20 mg Oral Daily Himabindu Ravi, MD   20 mg at 05/08/12 0807  . ibuprofen (ADVIL,MOTRIN) tablet 400 mg  400 mg Oral Q4H PRN Nanine Means, NP   400 mg at 05/07/12 1616  . magnesium hydroxide (MILK OF MAGNESIA) suspension 30 mL  30 mL Oral Daily PRN Rachael Fee, MD      . metFORMIN (GLUCOPHAGE-XR) 24 hr tablet 500 mg  500 mg Oral BID WC Rachael Fee, MD   500 mg at 05/08/12 0807  . nortriptyline (PAMELOR) capsule 50 mg  50 mg Oral QHS Himabindu Ravi, MD   50 mg at 05/07/12 2109  . ondansetron (ZOFRAN-ODT) disintegrating tablet 4 mg  4 mg Oral Q8H PRN Cleotis Nipper, MD      . pramoxine-hydrocortisone (PROCTOCREAM-HC) rectal cream   Rectal BID Himabindu Ravi, MD      . traZODone (DESYREL) tablet 150 mg  150 mg  Oral QHS PRN Patrick North, MD   150 mg at 05/07/12 2111  . ziprasidone (GEODON) capsule 80 mg  80 mg Oral QAC supper Rachael Fee, MD   80 mg at 05/07/12 1712    Lab Results:  Results for orders placed during the hospital encounter of 05/06/12 (from the past 48 hour(s))  URINALYSIS, ROUTINE W REFLEX MICROSCOPIC     Status: Abnormal   Collection Time   05/06/12  6:11 PM      Component Value Range Comment   Color, Urine YELLOW  YELLOW    APPearance CLOUDY (*) CLEAR    Specific Gravity, Urine 1.019  1.005 - 1.030    pH 5.5  5.0 - 8.0    Glucose, UA NEGATIVE  NEGATIVE mg/dL    Hgb urine dipstick NEGATIVE  NEGATIVE    Bilirubin Urine NEGATIVE  NEGATIVE    Ketones, ur NEGATIVE  NEGATIVE mg/dL    Protein, ur NEGATIVE  NEGATIVE mg/dL     Urobilinogen, UA 0.2  0.0 - 1.0 mg/dL    Nitrite NEGATIVE  NEGATIVE    Leukocytes, UA SMALL (*) NEGATIVE   PREGNANCY, URINE     Status: Normal   Collection Time   05/06/12  6:11 PM      Component Value Range Comment   Preg Test, Ur NEGATIVE  NEGATIVE   URINE MICROSCOPIC-ADD ON     Status: Abnormal   Collection Time   05/06/12  6:11 PM      Component Value Range Comment   Squamous Epithelial / LPF FEW (*) RARE    WBC, UA 3-6  <3 WBC/hpf    Bacteria, UA RARE  RARE   COMPREHENSIVE METABOLIC PANEL     Status: Abnormal   Collection Time   05/06/12  7:54 PM      Component Value Range Comment   Sodium 135  135 - 145 mEq/L    Potassium 3.8  3.5 - 5.1 mEq/L    Chloride 98  96 - 112 mEq/L    CO2 28  19 - 32 mEq/L    Glucose, Bld 85  70 - 99 mg/dL    BUN 16  6 - 23 mg/dL    Creatinine, Ser 8.29  0.50 - 1.10 mg/dL    Calcium 9.3  8.4 - 56.2 mg/dL    Total Protein 7.8  6.0 - 8.3 g/dL    Albumin 4.0  3.5 - 5.2 g/dL    AST 21  0 - 37 U/L    ALT 16  0 - 35 U/L    Alkaline Phosphatase 93  39 - 117 U/L    Total Bilirubin 0.2 (*) 0.3 - 1.2 mg/dL    GFR calc non Af Amer 87 (*) >90 mL/min    GFR calc Af Amer >90  >90 mL/min   CBC     Status: Abnormal   Collection Time   05/06/12  7:54 PM      Component Value Range Comment   WBC 11.8 (*) 4.0 - 10.5 K/uL    RBC 4.55  3.87 - 5.11 MIL/uL    Hemoglobin 14.3  12.0 - 15.0 g/dL    HCT 13.0  86.5 - 78.4 %    MCV 92.1  78.0 - 100.0 fL    MCH 31.4  26.0 - 34.0 pg    MCHC 34.1  30.0 - 36.0 g/dL    RDW 69.6  29.5 - 28.4 %    Platelets 316  150 - 400  K/uL   HEMOGLOBIN A1C     Status: Normal   Collection Time   05/06/12  7:54 PM      Component Value Range Comment   Hemoglobin A1C 5.2  <5.7 %    Mean Plasma Glucose 103  <117 mg/dL     Physical Findings: AIMS: Facial and Oral Movements Muscles of Facial Expression: None, normal Lips and Perioral Area: None, normal Jaw: None, normal Tongue: None, normal,Extremity Movements Upper (arms, wrists, hands,  fingers): None, normal Lower (legs, knees, ankles, toes): None, normal, Trunk Movements Neck, shoulders, hips: None, normal, Overall Severity Severity of abnormal movements (highest score from questions above): None, normal Incapacitation due to abnormal movements: None, normal Patient's awareness of abnormal movements (rate only patient's report): No Awareness, Dental Status Current problems with teeth and/or dentures?: No Does patient usually wear dentures?: No  CIWA:  CIWA-Ar Total: 0  COWS:     Treatment Plan Summary: Daily contact with patient to assess and evaluate symptoms and progress in treatment Medication management  Plan: Patient continues to have suicidal thinking, paranoia and hallucination.  She is taking Prozac, Geodon and when necessary trazodone.  She's also taking Klonopin up to 3 times a day.  She's having nausea.  I discussed safety plan and also medication adjustment.  We will provide Zofran for nausea.  I also recommend to try Geodon 20 mg in the morning.  Reassurance given.  We will continue to monitor the therapeutic response of medication.  Patient still requires inpatient psychiatric treatment.  Encourage her to eat for a better effect of Geodon.  Patient does not have any side effects of medication.  Medical Decision Making Problem Points:  Established problem, worsening (2), New problem, with additional work-up planned (4) and Review of psycho-social stressors (1) Data Points:  Review or order medicine tests (1) Review and summation of old records (2) Review of medication regiment & side effects (2) Review of new medications or change in dosage (2)  I certify that inpatient services furnished can reasonably be expected to improve the patient's condition.   Armandina Iman T. 05/08/2012, 10:16 AM

## 2012-05-09 NOTE — Progress Notes (Signed)
Patient ID: Katelyn [name], female   DOB: 06-Jul-1975, 37 y.o.   MRN: [id number] D)  Has been rather quiet, affect is flat, sad, distant.   Seems to have limited interactions with peers, staff.  Attended group, came to med window, compliant with meds.  Came to nsg station later, said she was afraid she couldn't sleep, states she has flashbacks and bad dreams and sometimes is afraid to go to sleep.  We reviewed relaxation techniques, visualizing peaceful places, the beach, etc.  Didn't think it would work.  Suggested she think about all of her blessings, thought that would work and went back to bed. A)  Will continue to monitor q 15 minutes for safety. R)  Remains safe on unit.

## 2012-05-09 NOTE — Progress Notes (Signed)
[facility] Group Notes:  (Counselor/Nursing/MHT/Case Management/Adjunct)  05/09/2012 3:00PM  Type of Therapy:  Group Therapy  Participation Level:  Active  Participation Quality:  Appropriate, Attentive and Supportive  Affect:  Appropriate and Flat  Cognitive:  Alert and Appropriate  Insight:  Supportive  Engagement in Group:  Limited  Engagement in Therapy:  Limited  Modes of Intervention:  Clarification, Discussion and Education  Summary of Progress/Problems:Group discussion on supportive aftercare groups.   Pixie Casino [facility] 05/09/2012, 8:45 PM

## 2012-05-09 NOTE — Progress Notes (Signed)
[facility] MD Progress Note  05/09/2012 10:14 AM Katelyn [name]  MRN:  [id number] Subjective:  Patient seen and chart reviewed.  She had better sleep last night.  She also eating better but she continued to have depressive symptoms.  She is tolerating her medication without any side effects.  She still has passive suicidal thoughts and paranoia however there is no plan for suicide.  She feel Prozac helped somewhat .  She had participated some groups.  She denies any tremors or shakes.  She has some nausea and she was given Zofran yesterday which helped.   Diagnosis:  Axis I: Major Depression, Recurrent severe  ADL's:  Intact  Sleep: Poor  Appetite:  Poor  Suicidal Ideation:  Plan:  Patient still has passive suicidal thinking but denies any current plan. Homicidal Ideation:  Plan:  Denies any homicidal thoughts or plan. AEB (as evidenced by):  Psychiatric Specialty Exam: Review of Systems  Neurological: Positive for weakness.  Psychiatric/Behavioral: Positive for depression and suicidal ideas. The patient is nervous/anxious and has insomnia.     Blood pressure 126/76, pulse 108, temperature 98.6 F (37 C), temperature source Oral, resp. rate 15, height 5\' 6"  (1.676 m), weight 98.884 kg (218 lb), last menstrual period 04/21/2012, SpO2 100.00%.Body mass index is 35.19 kg/(m^2).  General Appearance: Fairly Groomed and Guarded  Patent attorney::  Fair  Speech:  Slow  Volume:  Decreased  Mood:  Anxious, Depressed and Hopeless  Affect:  Blunt and Flat  Thought Process:  Linear and Logical  Orientation:  Full (Time, Place, and Person)  Thought Content:  Rumination  Suicidal Thoughts:  Yes.  without intent/plan  Homicidal Thoughts:  No  Memory:  Intact  Judgement:  Fair  Insight:  Fair  Psychomotor Activity:  Decreased  Concentration:  Fair  Recall:  Fair  Akathisia:  No  Handed:  Right  AIMS (if indicated):     Assets:  Housing Physical Health Social Support  Sleep:  Number of Hours: 2     Current Medications: Current Facility-Administered Medications  Medication Dose Route Frequency Provider Last Rate Last Dose  . acetaminophen (TYLENOL) tablet 650 mg  650 mg Oral Q6H PRN Rachael Fee, MD   650 mg at 05/06/12 1953  . alum & mag hydroxide-simeth (MAALOX/MYLANTA) 200-200-20 MG/5ML suspension 30 mL  30 mL Oral Q4H PRN Rachael Fee, MD      . clonazePAM Scarlette Calico) tablet 1 mg  1 mg Oral TID AC Himabindu Ravi, MD   1 mg at 05/09/12 0653  . docusate sodium (COLACE) capsule 200 mg  200 mg Oral BID Himabindu Ravi, MD   200 mg at 05/09/12 0810  . FLUoxetine (PROZAC) capsule 20 mg  20 mg Oral Daily Himabindu Ravi, MD   20 mg at 05/09/12 0811  . ibuprofen (ADVIL,MOTRIN) tablet 400 mg  400 mg Oral Q4H PRN Nanine Means, NP   400 mg at 05/07/12 1616  . magnesium hydroxide (MILK OF MAGNESIA) suspension 30 mL  30 mL Oral Daily PRN Rachael Fee, MD      . metFORMIN (GLUCOPHAGE-XR) 24 hr tablet 500 mg  500 mg Oral BID WC Rachael Fee, MD   500 mg at 05/09/12 0811  . nortriptyline (PAMELOR) capsule 50 mg  50 mg Oral QHS Himabindu Ravi, MD   50 mg at 05/08/12 2149  . ondansetron (ZOFRAN-ODT) disintegrating tablet 4 mg  4 mg Oral Q8H PRN Cleotis Nipper, MD   4 mg at 05/09/12 4098  .  pramoxine-hydrocortisone (PROCTOCREAM-HC) rectal cream   Rectal BID Himabindu Ravi, MD      . traZODone (DESYREL) tablet 150 mg  150 mg Oral QHS PRN Himabindu Ravi, MD   150 mg at 05/08/12 2150  . ziprasidone (GEODON) capsule 20 mg  20 mg Oral BH-q7a Cleotis Nipper, MD   20 mg at 05/09/12 0746  . ziprasidone (GEODON) capsule 80 mg  80 mg Oral QAC supper Rachael Fee, MD   80 mg at 05/08/12 1807    Lab Results:  No results found for this or any previous visit (from the past 48 hour(s)).  Physical Findings: AIMS: Facial and Oral Movements Muscles of Facial Expression: None, normal Lips and Perioral Area: None, normal Jaw: None, normal Tongue: None, normal,Extremity Movements Upper (arms, wrists, hands,  fingers): None, normal Lower (legs, knees, ankles, toes): None, normal, Trunk Movements Neck, shoulders, hips: None, normal, Overall Severity Severity of abnormal movements (highest score from questions above): None, normal Incapacitation due to abnormal movements: None, normal Patient's awareness of abnormal movements (rate only patient's report): No Awareness, Dental Status Current problems with teeth and/or dentures?: No Does patient usually wear dentures?: No  CIWA:  CIWA-Ar Total: 0  COWS:     Treatment Plan Summary: Daily contact with patient to assess and evaluate symptoms and progress in treatment Medication management  Plan: Patient has shown mild improvement in her depression.  However she continues to have suicidal thinking, paranoia and hallucination.  She sleeping better and her appetite is also improved from the past.  She is taking Prozac, Pamelor, Geodon and when necessary trazodone.  She's also taking Klonopin up to 3 times a day.  I encourage her to participate in group milieu therapy.  We will consider increasing Prozac tomorrow to 30 mg and reducing Pamelor to 25 mg.  We will continue to monitor her progress and symptoms.  At this time patient does not have any side effects of medication.  Medical Decision Making Problem Points:  Established problem, stable/improving (1), Review of last therapy session (1) and Review of psycho-social stressors (1) Data Points:  Review or order medicine tests (1) Review of medication regiment & side effects (2)  I certify that inpatient services furnished can reasonably be expected to improve the patient's condition.   Romond Pipkins T. 05/09/2012, 10:14 AM

## 2012-05-09 NOTE — Clinical Social Work Psychosocial (Signed)
[facility] Group Notes:  (Clinical Social Work)  05/09/2012    Summary of Progress/Problems:   Summary of Progress/Problems:   The main focus of today's process group was for the patient to define "support" and describe what healthy supports are, then to identify the patient's current support system and decide on other supports that can be put in place to prevent future hospitalizations.  Roleplay was used to demonstrate definitions of different types of available supports.  An emphasis was placed on using therapist, doctor, therapy groups, self-help groups and problem-specific support groups to expand supports. The patient expressed nothing during group, but started a conversation with another patient about the [employer] resources that are available prior to CSW leaving the room.  Type of Therapy:  Process Group  Participation Level:  Minimal  Participation Quality:  Attentive  Affect:  Blunted and Depressed  Cognitive:  Appropriate and Oriented  Insight:  Limited  Engagement in Therapy:  Limited  Modes of Intervention:  Clarification, Education, Limit-setting, Problem-solving, Socialization, Support and Processing, Exploration, Discussion, Role-Play   Ambrose Mantle, LCSW 05/09/2012, 12:09 PM

## 2012-05-09 NOTE — Progress Notes (Signed)
Patient ID: Katelyn [name], female   DOB: Apr 09, 1976, 37 y.o.   MRN: [id number] D: "I'm OK, but depressed." A: Pt. Denies lethality and A/V/H's or other problems; affect and mood are congruent and reflect her depression.  Pt.'s affect is mostly flat to blunted, but was able to elicit a smile from her one time today. Taking meds and attending groups. R: Will continue to montitor for changes.

## 2012-05-09 NOTE — Progress Notes (Signed)
Psychoeducational Group Note  Date:  05/09/2012 Time:  2000  Group Topic/Focus:  Wellness Toolbox:   The focus of this group is to discuss various aspects of wellness, balancing those aspects and exploring ways to increase the ability to experience wellness.  Patients will create a wellness toolbox for use upon discharge.  Participation Level:  Active  Participation Quality:  Attentive  Affect:  Flat  Cognitive:  Oriented  Insight:  Limited  Engagement in Group:  Limited  Additional Comments:  Patient shared that she had "an okay" day.  Aryaan Persichetti, [place] Pigg 05/09/2012, 10:52 PM

## 2012-05-10 MED ORDER — NORTRIPTYLINE HCL 25 MG PO CAPS
25.0000 mg | ORAL_CAPSULE | Freq: Every day | ORAL | Status: DC
  Administered 2012-05-10 – 2012-05-11 (×2): 25 mg via ORAL
  Filled 2012-05-10 (×4): qty 1

## 2012-05-10 MED ORDER — FLUOXETINE HCL 20 MG PO CAPS
40.0000 mg | ORAL_CAPSULE | Freq: Every day | ORAL | Status: DC
  Administered 2012-05-11 – 2012-05-12 (×2): 40 mg via ORAL
  Filled 2012-05-10 (×4): qty 2

## 2012-05-10 NOTE — Progress Notes (Signed)
D.  Pt. Has flat affect, depressed mood.  Reports that she has some bad news today "pre cancerous polyps in colon" A.  Encouragement and support given. R.  Pt. Receptive.

## 2012-05-10 NOTE — Progress Notes (Signed)
Patient stated she has been hearing music and doorbells today.  SI off/on, contracts for safety.  Denied HI.  Denied visual hallucinations.  Stated she has decreased appetite.  Will go to dining room for dinner tonight.  Patient has been cooperative and pleasant.

## 2012-05-10 NOTE — Progress Notes (Signed)
Adult Psychoeducational Group Note  Date:  05/10/2012 Time:  10:54 PM  Group Topic/Focus:  Wrap-Up Group:   The focus of this group is to help patients review their daily goal of treatment and discuss progress on daily workbooks.  Participation Level:  Minimal  Participation Quality:  Appropriate  Affect:  Flat  Cognitive:  Appropriate  Insight: Appropriate  Engagement in Group:  Engaged  Modes of Intervention:  Support  Additional Comments:  Pt. Was present in group but chose not to share during group  Gwenevere Ghazi Patience 05/10/2012, 10:54 PM

## 2012-05-10 NOTE — Progress Notes (Signed)
Patient ID: Katelyn [name], female   DOB: 22-Aug-1975, 37 y.o.   MRN: [id number] D)  Remains sad, depressed, flat.  Did come out for group, but again voiced that being in group and around people seems to add to her anxiety.  Quiet, but appropriate with staff, rather isolative, admits to occasional passive thought of suicide, but feels a little better..  Compliant with meds. A)  Will continue to monitor for safety, support and encouragement. R)  Quiet, remains safe on unit.

## 2012-05-10 NOTE — Progress Notes (Signed)
[facility] MD Progress Note  05/10/2012 1:25 PM VIRJEAN BOMAN  MRN:  [id number] Subjective:  Patient continues to endorse depressed mood and suicidal thoughts. She is eating very little, poor appetite. Tolerating medications without side effects. Diagnosis:   Axis I: Major Depression, Recurrent severe Axis II: Deferred Axis III:  Past Medical History  Diagnosis Date  . Obesity   . Skin lesion     Excisional biopsy  . Depression     several suicide attempts, hospitaluzed in 2012 for this  . Diabetes mellitus type II   . Dizziness - light-headed   . Sleep apnea     Mild; moderate periodic limb movement disorder  . Polycystic ovary   . Anxiety   . Chronic headaches   . IBS (irritable bowel syndrome)   . History of pneumonia     x 3  . Benign juvenile melanoma   . Colon polyps     found on colonoscopy 04/26/2012   Axis IV: occupational problems and other psychosocial or environmental problems Axis V: 41-50 serious symptoms  ADL's:  Impaired  Sleep: Poor  Appetite:  Poor   Psychiatric Specialty Exam: Review of Systems  HENT: Negative.   Eyes: Negative.   Respiratory: Negative.   Cardiovascular: Negative.   Gastrointestinal: Negative.   Genitourinary: Negative.   Musculoskeletal: Negative.   Skin: Negative.   Neurological: Positive for weakness.  Endo/Heme/Allergies: Negative.   Psychiatric/Behavioral: Positive for depression and suicidal ideas. The patient is nervous/anxious.     Blood pressure 113/82, pulse 93, temperature 97.7 F (36.5 C), temperature source Oral, resp. rate 16, height 5\' 6"  (1.676 m), weight 98.884 kg (218 lb), last menstrual period 04/21/2012, SpO2 100.00%.Body mass index is 35.19 kg/(m^2).  General Appearance: Disheveled  Eye Solicitor::  Fair  Speech:  Slow  Volume:  Decreased  Mood:  Depressed, Dysphoric and Hopeless  Affect:  Constricted and Depressed  Thought Process:  Coherent  Orientation:  Full (Time, Place, and Person)  Thought  Content:  WDL  Suicidal Thoughts:  Yes.  with intent/plan  Homicidal Thoughts:  No  Memory:  Immediate;   Fair Recent;   Fair Remote;   Fair  Judgement:  Fair  Insight:  Present  Psychomotor Activity:  Normal  Concentration:  Fair  Recall:  Fair  Akathisia:  No  Handed:  Right  AIMS (if indicated):     Assets:  Communication Skills Desire for Improvement Housing  Sleep:  Number of Hours: 5.5    Current Medications: Current Facility-Administered Medications  Medication Dose Route Frequency Provider Last Rate Last Dose  . acetaminophen (TYLENOL) tablet 650 mg  650 mg Oral Q6H PRN Rachael Fee, MD   650 mg at 05/06/12 1953  . alum & mag hydroxide-simeth (MAALOX/MYLANTA) 200-200-20 MG/5ML suspension 30 mL  30 mL Oral Q4H PRN Rachael Fee, MD      . clonazePAM Scarlette Calico) tablet 1 mg  1 mg Oral TID AC Kyna Blahnik, MD   1 mg at 05/10/12 1059  . docusate sodium (COLACE) capsule 200 mg  200 mg Oral BID Jynesis Nakamura, MD   200 mg at 05/10/12 0820  . FLUoxetine (PROZAC) capsule 40 mg  40 mg Oral Daily Merrill Deanda, MD      . ibuprofen (ADVIL,MOTRIN) tablet 400 mg  400 mg Oral Q4H PRN Nanine Means, NP   400 mg at 05/09/12 1342  . magnesium hydroxide (MILK OF MAGNESIA) suspension 30 mL  30 mL Oral Daily PRN Rachael Fee, MD      .  metFORMIN (GLUCOPHAGE-XR) 24 hr tablet 500 mg  500 mg Oral BID WC Rachael Fee, MD   500 mg at 05/10/12 0945  . nortriptyline (PAMELOR) capsule 25 mg  25 mg Oral QHS Leightyn Cina, MD      . ondansetron (ZOFRAN-ODT) disintegrating tablet 4 mg  4 mg Oral Q8H PRN Cleotis Nipper, MD   4 mg at 05/09/12 0454  . pramoxine-hydrocortisone (PROCTOCREAM-HC) rectal cream   Rectal BID Kenlei Safi, MD      . traZODone (DESYREL) tablet 150 mg  150 mg Oral QHS PRN Yao Hyppolite, MD   150 mg at 05/09/12 2126  . ziprasidone (GEODON) capsule 20 mg  20 mg Oral BH-q7a Cleotis Nipper, MD   20 mg at 05/10/12 0820  . ziprasidone (GEODON) capsule 80 mg  80 mg Oral QAC supper  Rachael Fee, MD   80 mg at 05/09/12 1807    Lab Results: No results found for this or any previous visit (from the past 48 hour(s)).  Physical Findings: AIMS: Facial and Oral Movements Muscles of Facial Expression: None, normal Lips and Perioral Area: None, normal Jaw: None, normal Tongue: None, normal,Extremity Movements Upper (arms, wrists, hands, fingers): None, normal Lower (legs, knees, ankles, toes): None, normal, Trunk Movements Neck, shoulders, hips: None, normal, Overall Severity Severity of abnormal movements (highest score from questions above): None, normal Incapacitation due to abnormal movements: None, normal Patient's awareness of abnormal movements (rate only patient's report): No Awareness, Dental Status Current problems with teeth and/or dentures?: No Does patient usually wear dentures?: No  CIWA:  CIWA-Ar Total: 4  COWS:     Treatment Plan Summary: Daily contact with patient to assess and evaluate symptoms and progress in treatment Medication management  Plan: Decrease pamelor to 25mg  po qd. Increase Prozac too 40mg  po qd. Patient encouraged to eat, attend some groups.  Medical Decision Making Problem Points:  Established problem, stable/improving (1), Review of last therapy session (1) and Review of psycho-social stressors (1) Data Points:  Review of medication regiment & side effects (2) Review of new medications or change in dosage (2)  I certify that inpatient services furnished can reasonably be expected to improve the patient's condition.   Mishel Sans 05/10/2012, 1:25 PM

## 2012-05-10 NOTE — H&P (Signed)
Reviewed

## 2012-05-10 NOTE — Progress Notes (Signed)
Patient ID: Katelyn [name], female   DOB: Sep 07, 1975, 37 y.o.   MRN: [id number] D- Patient reports fair sleep and decreased appetite.  Her energy level is low and her ability to pay attention is improving..  She is having suicidal thoughts off and on. A Asked patient to contract for safety R- She agrees to tell staff if she feels unsafe.  She says she is still hearing doorbells and music and during group this am it was very distracting to hr.  She took her geodon this am, but says it is difficult for her to eat enough to absorb it effectively.  Talked with her about requesting a snack when needed for therapeutic absorption.  Patient has flat affect, but did smile briefly during conversation.

## 2012-05-10 NOTE — Clinical Social Work Note (Signed)
[facility] LCSW Group Therapy            Overcoming Obstacles         1:15 -2:30         05/10/2012   3:37 PM     Type of Therapy:  Group Therapy  Participation Level:  Limited  Participation Quality:Limited  Affect:  Depressed  Cognitive:  Attentive Appropriate  Insight:  Engaged  Engagement in Therapy:  Engaged  Modes of Intervention:  Discussion Exploration Problem-Solving Supportive  Summary of Progress/Problems:  Patient stated she is unable to identify an obstacle she needs to overcome.  Wynn Banker 05/10/2012    3:37 PM

## 2012-05-10 NOTE — Tx Team (Signed)
Interdisciplinary Treatment Plan Update (Adult)  Date:  05/10/2012  Time Reviewed:  9:54 AM   Progress in Treatment: Attending groups:   Yes   Participating in groups:  Yes Taking medication as prescribed:  Yes Tolerating medication:  Yes Family/Significant othe contact made: Contact to be made with family Patient understands diagnosis:  Yes Discussing patient identified problems/goals with staff: Yes Medical problems stabilized or resolved: Yes Denies suicidal/homicidal ideation:  No, but able to contract for safety Issues/concerns per patient self-inventory:  Other:   New problem(s) identified:  Reason for Continuation of Hospitalization: Anxiety Depression Medication stabilization A/H Suicidal ideation  Interventions implemented related to continuation of hospitalization:  Medication Management; safety checks q 15 mins  Additional comments:  Estimated length of stay:  3-5 days  Discharge Plan:  Home with outpatient follow up  New goal(s):  Review of initial/current patient goals per problem list:    1.  Goal(s): Eliminate SI/other thoughts of self harm   Met:  No  Target date: d/c  As evidenced by: Patient will no longer endorse SI/HI or other thoughts of self harm.    2.  Goal (s):Reduce depression/anxiety (rates at nine/ten today  Met: Yes  Target date: d/c  As evidenced by: Patient will rate symptoms at four or below    3.  Goal(s):.stabilize on meds   Met:  No  Target date: d/c  As evidenced by: Patient will report being stabilized on medications - less symptomatic    4.  Goal(s):  Eliminate Psychosis   Met: No  Target date: d/c  As evidenced by: Patient will no longer endorse A/VH or endorse less frequent occcurrences    Attendees: Patient:   05/10/2012 9:54 AM  Physican:  Patrick North, MD 05/10/2012 9:54 AM  Nursing:  Berneice Heinrich, RN 05/10/2012 9:54 AM   Nursing:   Neill Loft, RN 05/10/2012 9:54 AM   Clinical Social Worker:   Juline Patch, LCSW 05/10/2012 9:54 AM   Other: Tera Helper, PHM-NP 05/10/2012 9:54 AM   Other:   05/10/2012 9:54 AM Other:        05/10/2012 9:54 AM

## 2012-05-11 ENCOUNTER — Ambulatory Visit (HOSPITAL_COMMUNITY): Payer: Self-pay | Admitting: Psychiatry

## 2012-05-11 LAB — GLUCOSE, CAPILLARY: Glucose-Capillary: 72 mg/dL (ref 70–99)

## 2012-05-11 MED ORDER — LISINOPRIL 10 MG PO TABS
10.0000 mg | ORAL_TABLET | Freq: Every day | ORAL | Status: DC
  Administered 2012-05-11 – 2012-05-14 (×4): 10 mg via ORAL
  Filled 2012-05-11 (×7): qty 1

## 2012-05-11 NOTE — Clinical Social Work Note (Signed)
[facility] LCSW Aftercare Discharge Planning Group Note       8:30-9:30 AM  1/14/201412:43 PM  Participation Quality:  Limited  Affect:  Depressed, Flat  Cognitive:  Appropriate  Insight:  Engaged  Engagement in Group:  Limited  Modes of Intervention:  Education, Exploration, Problem-solving and Support  Summary of Progress/Problems:   Patient continues to endorse SI but able to contract for safety.  She rates all symptoms at ten.  Wynn Banker 05/11/2012 12:43 PM

## 2012-05-11 NOTE — Progress Notes (Signed)
Patient ID: Katelyn [name], female   DOB: June 19, 1975, 37 y.o.   MRN: [id number] D- Patient reports she slept well with medication.  Her appetite is poor and she continues to have difficulty eating in the cafeteria but is reluctant to draw attention to herself by not going.  Encouraged pt to think through how she can best meet her goal of being well-nourished.  Encouraged her to ask for what she needs. A- patient says she will think a about it.  She continues to be hopeless and depressed 10/10.

## 2012-05-11 NOTE — Clinical Social Work Note (Signed)
[facility] LCSW Group Therapy  Feelings Around Group Therapy 1:15 - 2:30             05/11/2012   3:18 PM     Type of Therapy:  Group Therapy  Participation Level:  Appropriate  Participation Quality:  Appropriate  Affect:  Appropriate  Cognitive:  Attentive Appropriate  Insight:  Engaged  Engagement in Therapy:  Engaged  Modes of Intervention:  Discussion Exploration Problem-Solving Supportive  Summary of Progress/Problems:  Patient shared she was okay with the diagnosis of depression but the eating disorder diagnosis is hard for her to accept.  She also shared that the psychosis diagnosis makes her feel like she is crazy.  Patient was commended for being so open during group.  Wynn Banker 05/11/2012  3:18 PM

## 2012-05-11 NOTE — Progress Notes (Signed)
Patient attended 11 am Psycho educational group which consisted of the therapeutic ball that discussed pts short and long term goals. Pt remain appropriate and alert during group. 

## 2012-05-11 NOTE — Progress Notes (Signed)
Patient ID: Katelyn [name], female   DOB: [birth year], 37 y.o.   MRN: [id number] [facility] MD Progress Note  05/11/2012 4:30 PM Katelyn [name]  MRN:  [id number]  Subjective:  "I have bad headaches, I get them a lot. My headaches started after my ECT in 2012, October. I have memory problems from that treatment as well. But it helped my depression a lot. I became suicidal this time because I felt overwhelmed. I take Excedrin headaches at home. I don't mind getting ECT again if my insurance will pay for it".  Diagnosis:   Axis I: Major Depression, Recurrent severe Axis II: Deferred Axis III:  Past Medical History  Diagnosis Date  . Obesity   . Skin lesion     Excisional biopsy  . Depression     several suicide attempts, hospitaluzed in 2012 for this  . Diabetes mellitus type II   . Dizziness - light-headed   . Sleep apnea     Mild; moderate periodic limb movement disorder  . Polycystic ovary   . Anxiety   . Chronic headaches   . IBS (irritable bowel syndrome)   . History of pneumonia     x 3  . Benign juvenile melanoma   . Colon polyps     found on colonoscopy 04/26/2012   Axis IV: occupational problems and other psychosocial or environmental problems Axis V: 41-50 serious symptoms  ADL's:  Fair  Sleep: 6.5 per documentation.  Appetite:  Fair   Psychiatric Specialty Exam: Review of Systems  HENT: Negative.   Eyes: Negative.   Respiratory: Negative.   Cardiovascular: Negative.   Gastrointestinal: Negative.   Genitourinary: Negative.   Musculoskeletal: Negative.   Skin: Negative.   Neurological: Positive for weakness.  Endo/Heme/Allergies: Negative.   Psychiatric/Behavioral: Positive for depression and suicidal ideas. The patient is nervous/anxious.     Blood pressure 154/93, pulse 91, temperature 97.3 F (36.3 C), temperature source Oral, resp. rate 18, height 5\' 6"  (1.676 m), weight 98.884 kg (218 lb), last menstrual period 04/21/2012, SpO2 100.00%.Body mass index  is 35.19 kg/(m^2).  General Appearance: Casual.  Eye Contact::  Fair  Speech:  Slow  Volume:  Decreased  Mood:  Depressed, Dysphoric and Hopeless  Affect:  Constricted and Depressed  Thought Process:  Coherent  Orientation:  Full (Time, Place, and Person)  Thought Content:  WDL  Suicidal Thoughts:  Yes.  with intent/plan  Homicidal Thoughts:  No  Memory:  Immediate;   Fair Recent;   Fair Remote;   Fair  Judgement:  Fair  Insight:  Present  Psychomotor Activity:  Normal  Concentration:  Fair  Recall:  Fair  Akathisia:  No  Handed:  Right  AIMS (if indicated):     Assets:  Communication Skills Desire for Improvement Housing  Sleep:  Number of Hours: 6.75    Current Medications: Current Facility-Administered Medications  Medication Dose Route Frequency Provider Last Rate Last Dose  . acetaminophen (TYLENOL) tablet 650 mg  650 mg Oral Q6H PRN Rachael Fee, MD   650 mg at 05/06/12 1953  . alum & mag hydroxide-simeth (MAALOX/MYLANTA) 200-200-20 MG/5ML suspension 30 mL  30 mL Oral Q4H PRN Rachael Fee, MD      . clonazePAM Scarlette Calico) tablet 1 mg  1 mg Oral TID AC Himabindu Ravi, MD   1 mg at 05/11/12 1118  . docusate sodium (COLACE) capsule 200 mg  200 mg Oral BID Himabindu Ravi, MD   200 mg at 05/11/12 0800  .  FLUoxetine (PROZAC) capsule 40 mg  40 mg Oral Daily Himabindu Ravi, MD   40 mg at 05/11/12 0800  . ibuprofen (ADVIL,MOTRIN) tablet 400 mg  400 mg Oral Q4H PRN Nanine Means, NP   400 mg at 05/09/12 1342  . lisinopril (PRINIVIL,ZESTRIL) tablet 10 mg  10 mg Oral Daily Sanjuana Kava, NP      . magnesium hydroxide (MILK OF MAGNESIA) suspension 30 mL  30 mL Oral Daily PRN Rachael Fee, MD   30 mL at 05/11/12 0921  . metFORMIN (GLUCOPHAGE-XR) 24 hr tablet 500 mg  500 mg Oral BID WC Rachael Fee, MD   500 mg at 05/11/12 0802  . nortriptyline (PAMELOR) capsule 25 mg  25 mg Oral QHS Himabindu Ravi, MD   25 mg at 05/10/12 2156  . ondansetron (ZOFRAN-ODT) disintegrating tablet 4 mg   4 mg Oral Q8H PRN Cleotis Nipper, MD   4 mg at 05/09/12 1914  . pramoxine-hydrocortisone (PROCTOCREAM-HC) rectal cream   Rectal BID Himabindu Ravi, MD      . traZODone (DESYREL) tablet 150 mg  150 mg Oral QHS PRN Himabindu Ravi, MD   150 mg at 05/10/12 2156  . ziprasidone (GEODON) capsule 20 mg  20 mg Oral BH-q7a Cleotis Nipper, MD   20 mg at 05/11/12 0602  . ziprasidone (GEODON) capsule 80 mg  80 mg Oral QAC supper Rachael Fee, MD   80 mg at 05/10/12 1804    Lab Results: No results found for this or any previous visit (from the past 48 hour(s)).  Physical Findings: AIMS: Facial and Oral Movements Muscles of Facial Expression: None, normal Lips and Perioral Area: None, normal Jaw: None, normal Tongue: None, normal,Extremity Movements Upper (arms, wrists, hands, fingers): None, normal Lower (legs, knees, ankles, toes): None, normal, Trunk Movements Neck, shoulders, hips: None, normal, Overall Severity Severity of abnormal movements (highest score from questions above): None, normal Incapacitation due to abnormal movements: None, normal Patient's awareness of abnormal movements (rate only patient's report): No Awareness, Dental Status Current problems with teeth and/or dentures?: No Does patient usually wear dentures?: No  CIWA:  CIWA-Ar Total: 4  COWS:     Treatment Plan Summary: Daily contact with patient to assess and evaluate symptoms and progress in treatment Medication management  Plan:  Supportive approach/coping skills. Start Lisinopril 10 mg daily for HTN, increased B/Pmay be reason for headaches. Encouraged group participation. Continue current plan of care.  Medical Decision Making Problem Points:  Established problem, stable/improving (1), Review of last therapy session (1) and Review of psycho-social stressors (1) Data Points:  Review of medication regiment & side effects (2) Review of new medications or change in dosage (2)  I certify that inpatient services  furnished can reasonably be expected to improve the patient's condition.   Armandina Stammer I 05/11/2012, 4:30 PM

## 2012-05-12 LAB — GLUCOSE, CAPILLARY
Glucose-Capillary: 74 mg/dL (ref 70–99)
Glucose-Capillary: 58 mg/dL — ABNORMAL LOW (ref 70–99)

## 2012-05-12 MED ORDER — FLUOXETINE HCL 20 MG PO CAPS
60.0000 mg | ORAL_CAPSULE | Freq: Every day | ORAL | Status: DC
  Administered 2012-05-13 – 2012-05-14 (×2): 60 mg via ORAL
  Filled 2012-05-12 (×3): qty 3

## 2012-05-12 NOTE — Progress Notes (Signed)
D: Patient denies HI and visual hallucinations. The patient has auditory hallucinations and passive SI but contracts for safety. The patient has a depressed mood and affect. The patient rates her depression a 9 out of 10 and her hopelessness a 10 out of 10 (1 low/10 high). The patient reports sleeping fairly well and states that her appetite is poor and that her energy level is low. The patient is attending groups on the unit and is interacting appropriately.  A: Patient given emotional support from RN. Patient encouraged to come to staff with concerns and/or questions. Patient's medication routine continued. Patient's orders and plan of care reviewed.  R: Patient remains cooperative. Will continue to monitor patient q15 minutes for safety.

## 2012-05-12 NOTE — Progress Notes (Signed)
Adult Psychoeducational Group Note  Date:  05/12/2012 Time:  1100  Group Topic/Focus:  Personal Choices and Values:   The focus of this group is to help patients assess and explore the importance of values in their lives, how their values affect their decisions, how they express their values and what opposes their expression.  Participation Level:  None  Participation Quality:  Appropriate and Attentive  Affect:  Appropriate and Depressed  Cognitive:  Appropriate  Insight: None  Engagement in Group:  None  Modes of Intervention:  Education  Additional Comments:  Keayra attended group and did not share. Patient was asked what were some negative and positive choices and values throughout patient life span. Patient also asked to review identifying values in workbook and discussed information from the choosing a value oriented life. Patient was asked to speak, patient was reserved.    Karleen Hampshire Brittini 05/12/2012, 6:30 PM

## 2012-05-12 NOTE — Progress Notes (Signed)
[facility] MD Progress Note  05/12/2012 3:12 PM Katelyn [name]  MRN:  [id number] Subjective:  Patient reports tolerating the increase in Prozac well, denies side effects. Not eating well, states she has been restricting herself to avoid gaining weight.  Diagnosis:   Axis I: Major Depression, Recurrent severe Axis II: Deferred Axis III:  Past Medical History  Diagnosis Date  . Obesity   . Skin lesion     Excisional biopsy  . Depression     several suicide attempts, hospitaluzed in 2012 for this  . Diabetes mellitus type II   . Dizziness - light-headed   . Sleep apnea     Mild; moderate periodic limb movement disorder  . Polycystic ovary   . Anxiety   . Chronic headaches   . IBS (irritable bowel syndrome)   . History of pneumonia     x 3  . Benign juvenile melanoma   . Colon polyps     found on colonoscopy 04/26/2012   Axis IV: occupational problems and other psychosocial or environmental problems Axis V: 41-50 serious symptoms  ADL's:  Intact  Sleep: Fair  Appetite:  Poor    Psychiatric Specialty Exam: Review of Systems  Constitutional: Negative.   HENT: Negative.   Eyes: Negative.   Respiratory: Negative.   Cardiovascular: Negative.   Gastrointestinal: Negative.   Genitourinary: Negative.   Musculoskeletal: Negative.   Skin: Negative.   Neurological: Negative.   Endo/Heme/Allergies: Negative.   Psychiatric/Behavioral: Positive for depression and suicidal ideas. The patient is nervous/anxious.     Blood pressure 111/77, pulse 102, temperature 98.3 F (36.8 C), temperature source Oral, resp. rate 18, height 5\' 6"  (1.676 m), weight 98.884 kg (218 lb), last menstrual period 04/21/2012, SpO2 100.00%.Body mass index is 35.19 kg/(m^2).  General Appearance: Casual  Eye Contact::  Fair  Speech:  Slow  Volume:  Decreased  Mood:  Anxious and Depressed  Affect:  Constricted and Depressed  Thought Process:  Coherent  Orientation:  Full (Time, Place, and Person)    Thought Content:  WDL  Suicidal Thoughts:  Yes.  without intent/plan  Homicidal Thoughts:  No  Memory:  Immediate;   Fair Recent;   Fair Remote;   Fair  Judgement:  Fair  Insight:  Present  Psychomotor Activity:  Decreased  Concentration:  Fair  Recall:  Fair  Akathisia:  No  Handed:  Right  AIMS (if indicated):     Assets:  Communication Skills Desire for Improvement Housing Social Support  Sleep:  Number of Hours: 6.5    Current Medications: Current Facility-Administered Medications  Medication Dose Route Frequency Provider Last Rate Last Dose  . acetaminophen (TYLENOL) tablet 650 mg  650 mg Oral Q6H PRN Rachael Fee, MD   650 mg at 05/06/12 1953  . alum & mag hydroxide-simeth (MAALOX/MYLANTA) 200-200-20 MG/5ML suspension 30 mL  30 mL Oral Q4H PRN Rachael Fee, MD      . clonazePAM Scarlette Calico) tablet 1 mg  1 mg Oral TID AC Teion Ballin, MD   1 mg at 05/12/12 1142  . docusate sodium (COLACE) capsule 200 mg  200 mg Oral BID Briasia Flinders, MD   200 mg at 05/12/12 0744  . FLUoxetine (PROZAC) capsule 40 mg  40 mg Oral Daily Shakhia Gramajo, MD   40 mg at 05/12/12 0744  . ibuprofen (ADVIL,MOTRIN) tablet 400 mg  400 mg Oral Q4H PRN Nanine Means, NP   400 mg at 05/12/12 1050  . lisinopril (PRINIVIL,ZESTRIL) tablet 10 mg  10 mg Oral Daily Sanjuana Kava, NP   10 mg at 05/12/12 0744  . magnesium hydroxide (MILK OF MAGNESIA) suspension 30 mL  30 mL Oral Daily PRN Rachael Fee, MD   30 mL at 05/11/12 0921  . metFORMIN (GLUCOPHAGE-XR) 24 hr tablet 500 mg  500 mg Oral BID WC Rachael Fee, MD   500 mg at 05/12/12 0744  . nortriptyline (PAMELOR) capsule 25 mg  25 mg Oral QHS Alilah Mcmeans, MD   25 mg at 05/11/12 2144  . ondansetron (ZOFRAN-ODT) disintegrating tablet 4 mg  4 mg Oral Q8H PRN Cleotis Nipper, MD   4 mg at 05/09/12 7829  . pramoxine-hydrocortisone (PROCTOCREAM-HC) rectal cream   Rectal BID Ernst Cumpston, MD      . traZODone (DESYREL) tablet 150 mg  150 mg Oral QHS PRN  Ritaj Dullea, MD   150 mg at 05/11/12 2144  . ziprasidone (GEODON) capsule 20 mg  20 mg Oral BH-q7a Cleotis Nipper, MD   20 mg at 05/12/12 0617  . ziprasidone (GEODON) capsule 80 mg  80 mg Oral QAC supper Rachael Fee, MD   80 mg at 05/11/12 1651    Lab Results:  Results for orders placed during the hospital encounter of 05/06/12 (from the past 48 hour(s))  GLUCOSE, CAPILLARY     Status: Normal   Collection Time   05/11/12  8:51 PM      Component Value Range Comment   Glucose-Capillary 72  70 - 99 mg/dL    Comment 1 Notify RN      Comment 2 Documented in Chart     GLUCOSE, CAPILLARY     Status: Normal   Collection Time   05/12/12  6:11 AM      Component Value Range Comment   Glucose-Capillary 74  70 - 99 mg/dL   GLUCOSE, CAPILLARY     Status: Abnormal   Collection Time   05/12/12 11:41 AM      Component Value Range Comment   Glucose-Capillary 58 (*) 70 - 99 mg/dL    Comment 1 Notify RN       Physical Findings: AIMS: Facial and Oral Movements Muscles of Facial Expression: None, normal Lips and Perioral Area: None, normal Jaw: None, normal Tongue: None, normal,Extremity Movements Upper (arms, wrists, hands, fingers): None, normal Lower (legs, knees, ankles, toes): None, normal, Trunk Movements Neck, shoulders, hips: None, normal, Overall Severity Severity of abnormal movements (highest score from questions above): None, normal Incapacitation due to abnormal movements: None, normal Patient's awareness of abnormal movements (rate only patient's report): No Awareness, Dental Status Current problems with teeth and/or dentures?: No Does patient usually wear dentures?: No  CIWA:  CIWA-Ar Total: 4  COWS:     Treatment Plan Summary: Daily contact with patient to assess and evaluate symptoms and progress in treatment Medication management  Plan: Increase Prozac to 60mg . Discontinue Pamelor. Encouraged patient to eat and that a good diet would aid in improving her mood. Plan  for discharge as patient`s mood improves and safe.  Medical Decision Making Problem Points:  Established problem, stable/improving (1), Review of last therapy session (1) and Review of psycho-social stressors (1) Data Points:  Review of medication regiment & side effects (2) Review of new medications or change in dosage (2)  I certify that inpatient services furnished can reasonably be expected to improve the patient's condition.   Raylee Strehl 05/12/2012, 3:12 PM

## 2012-05-12 NOTE — Progress Notes (Signed)
Patient ID: Katelyn [name], female   DOB: Aug 23, 1975, 37 y.o.   MRN: [id number]  Problem: Anxiety, Depression  D: Patient with dull, flat affect endorsing depression. Pt isolative to her room at times. A: Monitor Q 15 minutes for safety, encourage staff/peer interaction and group participation. Administer medications as ordered by MD. R: Patient with no inappropriate behaviors noted.

## 2012-05-12 NOTE — Progress Notes (Signed)
[facility] Group Notes:  (Counselor/Nursing/MHT/Case Management/Adjunct)  Type of Therapy:  Psychoeducational Skills  Participation Level:  Minimal  Participation Quality:  Attentive  Affect:  Depressed and Flat  Cognitive:  Appropriate and Oriented  Insight:  Limited  Engagement in Group:  Lacking and Limited  Modes of Intervention:  Activity, Discussion, Education, Problem-solving, Rapport Building, Socialization and Support  Summary of Progress/Problems: Katelyn [name] attended psychoeducational group that focused on using quality time with support systems/individuals to engage in health coping skills. Katelyn [name] participated in activity guessing about self and peers. Katelyn [name] was quiet but spoke when prompted while group discussed who their support systems are, how they can spend positive quality time with them as a coping skills and a way to strengthen their relationship, did state her family is a part of her support system. Katelyn [name] was given a homework assignment to find two ways to improve her support systems and twenty activities she can do to spend quality time with her supports.   Katelyn [name] 05/12/2012 1:55 PM

## 2012-05-12 NOTE — Tx Team (Signed)
Interdisciplinary Treatment Plan Update (Adult)  Date:  05/12/2012  Time Reviewed:  9:45 AM   Progress in Treatment: Attending groups:   Yes   Participating in groups:  Yes Taking medication as prescribed:  Yes Tolerating medication:  Yes Family/Significant othe contact made: Contact to be made with family Patient understands diagnosis:  Yes Discussing patient identified problems/goals with staff: Yes Medical problems stabilized or resolved: Yes Denies suicidal/homicidal ideation:  No but contracts for safety Issues/concerns per patient self-inventory:  Other:   New problem(s) identified:  Reason for Continuation of Hospitalization: Anxiety Depression Medication stabilization Suicidal ideation  Interventions implemented related to continuation of hospitalization:  Medication Management; safety checks q 15 mins  Additional comments:  Estimated length of stay:  3-5 days  Discharge Plan:  Home with outpatient follow up  New goal(s):  Review of initial/current patient goals per problem list:    1.  Goal(s): Eliminate SI/other thoughts of self harm   Met:  No  Target date: d/c  As evidenced by: Patient will no longer endorse SI/HI or other thoughts of self harm.    2.  Goal (s):Reduce depression/anxiety ( Rates symptoms at nine today)  Met: No  Target date: d/c  As evidenced by: Patient will rate symptoms at four or below    3.  Goal(s):.stabilize on meds   Met:  No  Target date: d/c  As evidenced by: Patient will report being stabilized on medications - less symptomatic    4.  Goal(s): Refer for outpatient follow up   Met:  No  Target date: d/c  As evidenced by: Follow up appointment scheduled    Attendees: Patient:   05/12/2012 9:45 AM  Physican:  Patrick North, MD 05/12/2012 9:45 AM  Nursing:    Nestor Ramp, RN 05/12/2012 9:45 AM   Nursing:   Berneice Heinrich, RN 05/12/2012 9:45 AM   Clinical Social Worker:  Juline Patch, LCSW 05/12/2012  9:45 AM   Other:  05/12/2012 9:45 AM   Other:         05/12/2012 9:45 AM Other:        05/12/2012 9:45 AM

## 2012-05-12 NOTE — Progress Notes (Signed)
Patient ID: Katelyn [name], female   DOB: Sep 13, 1975, 37 y.o.   MRN: [id number] D: Patient in dayroom on approach. Pt presented with depressed mood and flat affect. Pt endorses self harm thoughts by not eating. Pt stated "it silent and nobody will know". Pt has a  hx of social anxiety and not comfortable eating in the dinning room.  Pt denies SI/HI/AVH and pain. Pt attended evening wrap up group and did not Interact with peers. Cooperative with assessment. No acute distressed noted at this time.   A: Met with pt 1:1. Medications administered as prescribed. Writer encouraged pt to discuss feelings. Wrier encouraged pt to work on her fears and set goals daily. Writer also offered to have meals brought back to unit. Pt encouraged to come to staff with any question or concerns.  R: Patient remains safe. She is complaint with medications and group programming. Pt refused to have meal brought back to unit. Pt stated "will work on facing her fears". Continue current POC.
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
## 2012-05-12 NOTE — Clinical Social Work Note (Signed)
[facility] LCSW Aftercare Discharge Planning Group Note       8:30-9:30 AM  1/15/201411:21 AM  Participation Quality:  Appropriate  Affect:  Depressed, Blunted, Flat  Cognitive:  Appropriate  Insight:  Engaged  Engagement in Group:  Engaged  Modes of Intervention:  Education, Exploration, Problem-solving and Support  Summary of Progress/Problems:  Patient continues to endorse SI but able to contract for safety.  She rates depression at nine and all other symptoms at ten.  Wynn Banker 05/12/2012 11:21 AM

## 2012-05-13 DIAGNOSIS — F333 Major depressive disorder, recurrent, severe with psychotic symptoms: Principal | ICD-10-CM

## 2012-05-13 DIAGNOSIS — F411 Generalized anxiety disorder: Secondary | ICD-10-CM

## 2012-05-13 LAB — GLUCOSE, CAPILLARY: Glucose-Capillary: 78 mg/dL (ref 70–99)

## 2012-05-13 MED ORDER — CLONAZEPAM 1 MG PO TABS
1.0000 mg | ORAL_TABLET | Freq: Once | ORAL | Status: AC
  Administered 2012-05-13: 1 mg via ORAL
  Filled 2012-05-13: qty 1

## 2012-05-13 NOTE — Clinical Social Work Note (Signed)
[facility] LCSW Group Therapy  Living a Balanced Life 1:15 - 3: 30          05/13/2012  4:26 PM    Type of Therapy:  Group Therapy  Participation Level:  Appropriate  Participation Quality:  Appropriate  Affect:  Depressed  Cognitive:  Attentive Appropriate  Insight:  Engaged  Engagement in Therapy:  Engaged  Modes of Intervention:  Discussion Exploration Problem-Solving Supportive  Summary of Progress/Problems: Patient listened attentively to speaker from [employer] She asked if she had to call in advance to come to their programs and was advised she could just show up for support groups but would need to call ahead for other programs.  Patient stated she is interested in attending.  Wynn Banker 05/13/2012  4:26 PM

## 2012-05-13 NOTE — Clinical Social Work Note (Signed)
[facility] LCSW Aftercare Discharge Planning Group Note       8:30-9:30 AM  1/16/20144:24 PM  Participation Quality:  Appropriate  Affect:  Appropriate  Cognitive:  Appropriate  Insight:  Engaged  Engagement in Group:  Engaged  Modes of Intervention:  Education, Exploration, Problem-solving and Support  Summary of Progress/Problems:  Patient advised of being better today.  She endorsed off/on SI but contracted for safety.  She rated depression at eight, anxiety at ten and hopelessness/helplessness at seven.  Wynn Banker 05/13/2012 4:24 PM

## 2012-05-13 NOTE — Progress Notes (Signed)
[facility] INPATIENT:  Family/Significant Other Suicide Prevention Education  Suicide Prevention Education:  Education Completed; Devoria Albe, husband, [phone] has been identified by the patient as the family member/significant other with whom the patient will be residing, and identified as the person(s) who will aid the patient in the event of a mental health crisis (suicidal ideations/suicide attempt).  With written consent from the patient, the family member/significant other has been provided the following suicide prevention education, prior to the and/or following the discharge of the patient.  The suicide prevention education provided includes the following:  Suicide risk factors  Suicide prevention and interventions  National Suicide Hotline telephone number  [facility] 2 assessment telephone number  [facility] Emergency Assistance 911  [facility] and/or Residential Mobile Crisis Unit telephone number  Request made of family/significant other to:  Remove weapons (e.g., guns, rifles, knives), all items previously/currently identified as safety concern.  Husband reports there are no guns in the home.  Remove drugs/medications (over-the-counter, prescriptions, illicit drugs), all items previously/currently identified as a safety concern.  The family member/significant other verbalizes understanding of the suicide prevention education information provided.  The family member/significant other agrees to remove the items of safety concern listed above.  Wynn Banker 05/13/2012, 4:23 PM

## 2012-05-13 NOTE — Progress Notes (Signed)
During 11 am psycho educational group pts were encouraged to talk about positive activities that they can do and participate in to reduce stress and and use as coping skills for difficult times.  

## 2012-05-13 NOTE — Progress Notes (Signed)
D: Patient denies HI and auditory and visual hallucinations. The patient is having passive SI but verbally agrees to contract for safety. The patient has a depressed mood and affect. The patient reports sleeping well and states that her appetite is poor and that her energy level is low. The patient stated to RN that she has a "eating disorder." Patient states that she "hasn't been eating for days." RN did observe patient eating snacks and drinking juice yesterday.  A: Patient given emotional support from RN. Patient encouraged to come to staff with concerns and/or questions. Patient's medication routine continued. Patient's orders and plan of care reviewed. Patient was given education regarding proper nutrition and overall health status.  R: Patient remains cooperative. Will continue to monitor patient q15 minutes for safety. Will continue to ensure that patient is receiving proper intake.

## 2012-05-13 NOTE — Progress Notes (Signed)
Katelyn [facility] MD Progress Note  05/13/2012 2:06 PM PHENIX GREIN  MRN:  [id number] Subjective:  Reports mood is im[proving. Continues to have on/off suicidal thoughts.  Diagnosis:   Axis I: Major Depression, Recurrent severe Axis II: No diagnosis Axis III:  Past Medical History  Diagnosis Date  . Obesity   . Skin lesion     Excisional biopsy  . Depression     several suicide attempts, hospitaluzed in 2012 for this  . Diabetes mellitus type II   . Dizziness - light-headed   . Sleep apnea     Mild; moderate periodic limb movement disorder  . Polycystic ovary   . Anxiety   . Chronic headaches   . IBS (irritable bowel syndrome)   . History of pneumonia     x 3  . Benign juvenile melanoma   . Colon polyps     found on colonoscopy 04/26/2012   Axis IV: occupational problems and other psychosocial or environmental problems Axis V: 41-50 serious symptoms  ADL's:  Intact  Sleep: Fair  Appetite:  Poor   Psychiatric Specialty Exam: Review of Systems  Constitutional: Negative.   HENT: Negative.   Respiratory: Negative.   Cardiovascular: Negative.   Gastrointestinal: Positive for constipation.  Genitourinary: Negative.   Musculoskeletal: Negative.   Skin: Negative.   Neurological: Negative.   Endo/Heme/Allergies: Negative.   Psychiatric/Behavioral: Positive for depression and suicidal ideas.    Blood pressure 111/77, pulse 102, temperature 98.3 F (36.8 C), temperature source Oral, resp. rate 18, height 5\' 6"  (1.676 m), weight 98.884 kg (218 lb), last menstrual period 04/21/2012, SpO2 100.00%.Body mass index is 35.19 kg/(m^2).  General Appearance: Casual  Eye Contact::  Fair  Speech:  Clear and Coherent  Volume:  Normal  Mood:  Dysphoric  Affect:  Constricted  Thought Process:  Coherent  Orientation:  Full (Time, Place, and Person)  Thought Content:  WDL  Suicidal Thoughts:  Yes.  without intent/plan  Homicidal Thoughts:  No  Memory:  Immediate;   Fair Recent;    Fair Remote;   Fair  Judgement:  Fair  Insight:  Fair  Psychomotor Activity:  Normal  Concentration:  Fair  Recall:  Fair  Akathisia:  No  Handed:  Right  AIMS (if indicated):     Assets:  Communication Skills Desire for Improvement Housing Social Support  Sleep:  Number of Hours: 5    Current Medications: Current Facility-Administered Medications  Medication Dose Route Frequency Provider Last Rate Last Dose  . acetaminophen (TYLENOL) tablet 650 mg  650 mg Oral Q6H PRN Rachael Fee, MD   650 mg at 05/06/12 1953  . alum & mag hydroxide-simeth (MAALOX/MYLANTA) 200-200-20 MG/5ML suspension 30 mL  30 mL Oral Q4H PRN Rachael Fee, MD      . clonazePAM Scarlette Calico) tablet 1 mg  1 mg Oral TID AC Ridhi Hoffert, MD   1 mg at 05/13/12 1105  . docusate sodium (COLACE) capsule 200 mg  200 mg Oral BID Verla Bryngelson, MD   200 mg at 05/13/12 0724  . FLUoxetine (PROZAC) capsule 60 mg  60 mg Oral Daily Sukaina Toothaker, MD   60 mg at 05/13/12 0724  . ibuprofen (ADVIL,MOTRIN) tablet 400 mg  400 mg Oral Q4H PRN Nanine Means, NP   400 mg at 05/13/12 0547  . lisinopril (PRINIVIL,ZESTRIL) tablet 10 mg  10 mg Oral Daily Sanjuana Kava, NP   10 mg at 05/13/12 0727  . magnesium hydroxide (MILK OF MAGNESIA) suspension 30  mL  30 mL Oral Daily PRN Rachael Fee, MD   30 mL at 05/13/12 0936  . metFORMIN (GLUCOPHAGE-XR) 24 hr tablet 500 mg  500 mg Oral BID WC Rachael Fee, MD   500 mg at 05/13/12 0724  . ondansetron (ZOFRAN-ODT) disintegrating tablet 4 mg  4 mg Oral Q8H PRN Cleotis Nipper, MD   4 mg at 05/09/12 1610  . pramoxine-hydrocortisone (PROCTOCREAM-HC) rectal cream   Rectal BID Macel Yearsley, MD      . traZODone (DESYREL) tablet 150 mg  150 mg Oral QHS PRN Mairim Bade, MD   150 mg at 05/12/12 2218  . ziprasidone (GEODON) capsule 20 mg  20 mg Oral BH-q7a Cleotis Nipper, MD   20 mg at 05/13/12 0724  . ziprasidone (GEODON) capsule 80 mg  80 mg Oral QAC supper Rachael Fee, MD   80 mg at 05/12/12 1701     Lab Results:  Results for orders placed during the [name] encounter of 05/06/12 (from the past 48 hour(s))  GLUCOSE, CAPILLARY     Status: Normal   Collection Time   05/11/12  8:51 PM      Component Value Range Comment   Glucose-Capillary 72  70 - 99 mg/dL    Comment 1 Notify RN      Comment 2 Documented in Chart     GLUCOSE, CAPILLARY     Status: Normal   Collection Time   05/12/12  6:11 AM      Component Value Range Comment   Glucose-Capillary 74  70 - 99 mg/dL   GLUCOSE, CAPILLARY     Status: Abnormal   Collection Time   05/12/12 11:41 AM      Component Value Range Comment   Glucose-Capillary 58 (*) 70 - 99 mg/dL    Comment 1 Notify RN     GLUCOSE, CAPILLARY     Status: Normal   Collection Time   05/12/12  9:43 PM      Component Value Range Comment   Glucose-Capillary 91  70 - 99 mg/dL   GLUCOSE, CAPILLARY     Status: Normal   Collection Time   05/13/12  5:58 AM      Component Value Range Comment   Glucose-Capillary 78  70 - 99 mg/dL     Physical Findings: AIMS: Facial and Oral Movements Muscles of Facial Expression: None, normal Lips and Perioral Area: None, normal Jaw: None, normal Tongue: None, normal,Extremity Movements Upper (arms, wrists, hands, fingers): None, normal Lower (legs, knees, ankles, toes): None, normal, Trunk Movements Neck, shoulders, hips: None, normal, Overall Severity Severity of abnormal movements (highest score from questions above): None, normal Incapacitation due to abnormal movements: None, normal Patient's awareness of abnormal movements (rate only patient's report): No Awareness, Dental Status Current problems with teeth and/or dentures?: No Does patient usually wear dentures?: No  CIWA:  CIWA-Ar Total: 4  COWS:     Treatment Plan Summary: Daily contact with patient to assess and evaluate symptoms and progress in treatment Medication management  Plan: Continue current plan of care. Plan for discharge tomorrow.  Medical  Decision Making Problem Points:  Established problem, stable/improving (1), Review of last therapy session (1) and Review of psycho-social stressors (1) Data Points:  Review of medication regiment & side effects (2)  I certify that inpatient services furnished can reasonably be expected to improve the patient's condition.   Bailee Metter 05/13/2012, 2:06 PM

## 2012-05-14 ENCOUNTER — Ambulatory Visit (HOSPITAL_COMMUNITY): Payer: Self-pay | Admitting: Psychiatry

## 2012-05-14 MED ORDER — ZIPRASIDONE HCL 20 MG PO CAPS: 20.0000 mg | ORAL_CAPSULE | ORAL | Status: DC

## 2012-05-14 MED ORDER — ZIPRASIDONE HCL 80 MG PO CAPS: 80.0000 mg | ORAL_CAPSULE | Freq: Every day | ORAL | Status: DC

## 2012-05-14 MED ORDER — TRAZODONE HCL 150 MG PO TABS: 150.0000 mg | ORAL_TABLET | Freq: Every evening | ORAL | Status: DC | PRN

## 2012-05-14 MED ORDER — ASPIRIN-ACETAMINOPHEN-CAFFEINE 250-250-65 MG PO TABS: 1.0000 | ORAL_TABLET | Freq: Four times a day (QID) | ORAL | Status: DC | PRN

## 2012-05-14 MED ORDER — DOCUSATE SODIUM 100 MG PO CAPS: 200.0000 mg | ORAL_CAPSULE | Freq: Two times a day (BID) | ORAL | Status: DC

## 2012-05-14 MED ORDER — LISINOPRIL 10 MG PO TABS: 10.0000 mg | ORAL_TABLET | Freq: Every day | ORAL | Status: DC

## 2012-05-14 MED ORDER — CLONAZEPAM 1 MG PO TABS: 1.0000 mg | ORAL_TABLET | Freq: Three times a day (TID) | ORAL | Status: DC

## 2012-05-14 MED ORDER — FLUOXETINE HCL 20 MG PO CAPS: 60.0000 mg | ORAL_CAPSULE | Freq: Every day | ORAL | Status: DC

## 2012-05-14 MED ORDER — CALCIUM CARBONATE ANTACID 500 MG PO CHEW: 2.0000 | CHEWABLE_TABLET | Freq: Every day | ORAL | Status: DC | PRN

## 2012-05-14 MED ORDER — METFORMIN HCL ER 500 MG PO TB24: 500.0000 mg | ORAL_TABLET | Freq: Two times a day (BID) | ORAL | Status: DC

## 2012-05-14 NOTE — Discharge Summary (Signed)
Physician Discharge Summary Note  Patient:  Katelyn [name] is an 37 y.o., female MRN:  [id number] DOB:  02-Nov-1975 Patient phone:  [phone] (home)  Patient address:   [address] [place] [address],   Date of Admission:  05/06/2012 Date of Discharge: 05/14/2012  Reason for Admission:  Depression with psychosis and suicidal ideations  Discharge Diagnoses: Active Problems:  * No active hospital problems. *   Review of Systems  Constitutional: Negative.   HENT: Negative.   Eyes: Negative.   Respiratory: Negative.   Cardiovascular: Negative.   Genitourinary: Negative.   Musculoskeletal: Negative.   Skin: Negative.   Neurological: Negative.   Endo/Heme/Allergies: Negative.   Psychiatric/Behavioral: Positive for depression.   Axis Diagnosis:   AXIS I:  Major Depression, Recurrent severe AXIS II:  Deferred AXIS III:   Past Medical History  Diagnosis Date  . Obesity   . Skin lesion     Excisional biopsy  . Depression     several suicide attempts, hospitaluzed in 2012 for this  . Diabetes mellitus type II   . Dizziness - light-headed   . Sleep apnea     Mild; moderate periodic limb movement disorder  . Polycystic ovary   . Anxiety   . Chronic headaches   . IBS (irritable bowel syndrome)   . History of pneumonia     x 3  . Benign juvenile melanoma   . Colon polyps     found on colonoscopy 04/26/2012   AXIS IV:  other psychosocial or environmental problems, problems related to social environment and problems with primary support group AXIS V:  61-70 mild symptoms  Level of Care:  OP  Hospital Course:  Review of chart, vital signs, medications, and notes. 1-Individual and group therapy attended 2-Medication management for depression and anxiety:  Medications reviewed with the patient and no untoward effects 3-Coping skills for depression and anxiety developed and utilized 4-Crisis stabilization and management completed 5-Treatment plan in place to  prevent relapse of depression and anxiety  6-Patient denied suicidal/homicidal ideations and auditory/visual hallucinations, follow-up appointments encouraged to attend, patient plans to go to her outside support group today after discharge  Consults:  None  Significant Diagnostic Studies:  labs: completed and reviewed, stable  Discharge Vitals:   Blood pressure 107/75, pulse 94, temperature 98.5 F (36.9 C), temperature source Oral, resp. rate 18, height 5\' 6"  (1.676 m), weight 98.884 kg (218 lb), last menstrual period 04/21/2012, SpO2 100.00%. Body mass index is 35.19 kg/(m^2). Lab Results:   Results for orders placed during the hospital encounter of 05/06/12 (from the past 72 hour(s))  GLUCOSE, CAPILLARY     Status: Normal   Collection Time   05/11/12  8:51 PM      Component Value Range Comment   Glucose-Capillary 72  70 - 99 mg/dL    Comment 1 Notify RN      Comment 2 Documented in Chart     GLUCOSE, CAPILLARY     Status: Normal   Collection Time   05/12/12  6:11 AM      Component Value Range Comment   Glucose-Capillary 74  70 - 99 mg/dL   GLUCOSE, CAPILLARY     Status: Abnormal   Collection Time   05/12/12 11:41 AM      Component Value Range Comment   Glucose-Capillary 58 (*) 70 - 99 mg/dL    Comment 1 Notify RN     GLUCOSE, CAPILLARY     Status: Normal   Collection  Time   05/12/12  9:43 PM      Component Value Range Comment   Glucose-Capillary 91  70 - 99 mg/dL   GLUCOSE, CAPILLARY     Status: Normal   Collection Time   05/13/12  5:58 AM      Component Value Range Comment   Glucose-Capillary 78  70 - 99 mg/dL   GLUCOSE, CAPILLARY     Status: Abnormal   Collection Time   05/13/12  9:46 PM      Component Value Range Comment   Glucose-Capillary 66 (*) 70 - 99 mg/dL   GLUCOSE, CAPILLARY     Status: Abnormal   Collection Time   05/14/12  5:45 AM      Component Value Range Comment   Glucose-Capillary 65 (*) 70 - 99 mg/dL     Physical Findings: AIMS: Facial and Oral  Movements Muscles of Facial Expression: None, normal Lips and Perioral Area: None, normal Jaw: None, normal Tongue: None, normal,Extremity Movements Upper (arms, wrists, hands, fingers): None, normal Lower (legs, knees, ankles, toes): None, normal, Trunk Movements Neck, shoulders, hips: None, normal, Overall Severity Severity of abnormal movements (highest score from questions above): None, normal Incapacitation due to abnormal movements: None, normal Patient's awareness of abnormal movements (rate only patient's report): No Awareness, Dental Status Current problems with teeth and/or dentures?: No Does patient usually wear dentures?: No  CIWA:  CIWA-Ar Total: 4  COWS:     Psychiatric Specialty Exam: See Psychiatric Specialty Exam and Suicide Risk Assessment completed by Attending Physician prior to discharge.  Discharge destination:  Home  Is patient on multiple antipsychotic therapies at discharge:  No   Has Patient had three or more failed trials of antipsychotic monotherapy by history:  No Recommended Plan for Multiple Antipsychotic Therapies: NA  Discharge Orders    Future Appointments: Provider: Department: Dept Phone: Center:   05/20/2012 2:00 PM Carman Ching, LCSW [facility] [place] [phone] None   06/03/2012 2:00 PM Carman Ching, LCSW [facility] [place] [phone] None   06/17/2012 2:00 PM Carman Ching, LCSW [facility] [place] [phone] None   06/24/2012 2:00 PM Carman Ching, LCSW [facility] [place] [phone] None   07/01/2012 8:00 AM Kerri Perches, MD [facility] [phone] [facility]   07/01/2012 2:00 PM Carman Ching, LCSW [facility] [place] [phone] None   07/15/2012 2:00 PM Carman Ching, LCSW [facility] [place] [phone] None     Future  Orders Please Complete By Expires   Diet - low sodium heart healthy      Activity as tolerated - No restrictions          Medication List     As of 05/14/2012  1:21 PM    STOP taking these medications         acetaminophen 500 MG tablet   Commonly known as: TYLENOL      nortriptyline 50 MG capsule   Commonly known as: PAMELOR      TAKE these medications      Indication    aspirin-acetaminophen-caffeine 250-250-65 MG per tablet   Commonly known as: EXCEDRIN MIGRAINE   Take 1 tablet by mouth every 6 (six) hours as needed. For migraine    Indication: Headache      calcium carbonate 500 MG chewable tablet   Commonly known as: TUMS - dosed in mg elemental calcium   Chew 2 tablets (400 mg  of elemental calcium total) by mouth daily as needed. For heartburn    Indication: indigestion      clonazePAM 1 MG tablet   Commonly known as: KLONOPIN   Take 1 tablet (1 mg total) by mouth 3 (three) times daily.    Indication: Panic Disorder, ptsd      docusate sodium 100 MG capsule   Commonly known as: COLACE   Take 2 capsules (200 mg total) by mouth 2 (two) times daily.    Indication: Constipation      FLUoxetine 20 MG capsule   Commonly known as: PROZAC   Take 3 capsules (60 mg total) by mouth daily.    Indication: Depression      lisinopril 10 MG tablet   Commonly known as: PRINIVIL,ZESTRIL   Take 1 tablet (10 mg total) by mouth daily.    Indication: High Blood Pressure      metFORMIN 500 MG 24 hr tablet   Commonly known as: GLUCOPHAGE-XR   Take 1 tablet (500 mg total) by mouth 2 (two) times daily with a meal.    Indication: Type 2 Diabetes      traZODone 150 MG tablet   Commonly known as: DESYREL   Take 1 tablet (150 mg total) by mouth at bedtime as needed for sleep.       ziprasidone 20 MG capsule   Commonly known as: GEODON   Take 1 capsule (20 mg total) by mouth every morning.    Indication: mood disorder      ziprasidone 80 MG capsule   Commonly known as: GEODON     Take 1 capsule (80 mg total) by mouth daily before supper.    Indication: mood disorder           Follow-up Information    Follow up with Dr. Lolly Mustache. On 05/21/2012. (You are scheduled with Dr. Lolly Mustache on Friday, May 21, 2012 at 11 AM)    Contact information:   [address] [place], [place]   [address]  [phone]      Follow up with Carman Ching, LCSW. On 05/20/2012. (You are scheduled with ShannonEnglehorn on Thursday, May 20, 2012 at [facility])    Contact information:   [facility] [address] [place] [address] [phone]          Follow-up recommendations:  Activity as tolerated, low sodium heart healthy diet  Comments:  Patient will follow-up with Dr Lolly Mustache, her regular physician, after discharge.  Total Discharge Time:  Greater than 30 minutes  Signed: Nanine Means, PMH-NP 05/14/2012, 1:21 PM

## 2012-05-14 NOTE — Progress Notes (Signed)
Adult Psychoeducational Group Note  Date:  05/14/2012 Time:  11:32 AM  Group Topic/Focus:  Relapse Prevention Planning:   The focus of this group is to define relapse and discuss the need for planning to combat relapse.  Participation Level:  None  Participation Quality:  Appropriate  Affect:  Depressed and Flat  Cognitive:  Appropriate  Insight: None  Engagement in Group:  Lacking  Modes of Intervention:  Activity and Discussion  Additional Comments:  Pt stayed to the side of activities and declined to participate.  Pt stated that she was having anxiety attacks.  Pt was encouraged to communicate this to her nurse.  Pt was acknowledged for attending group even though she did not feel well.  Gwyndolyn Kaufman 05/14/2012, 11:32 AM

## 2012-05-14 NOTE — Progress Notes (Signed)
Pt discharged per MD orders; pt currently denies SI/HI and auditory/visual hallucinations; pt was given education by RN regarding follow-up appointments and medications and pt denied any questions or concerns about these instructions; pt was then escorted to search room to retrieve her belongings by RN before being discharged to hospital lobby. 

## 2012-05-14 NOTE — [facility] Suicide Risk Assessment (Signed)
Suicide Risk Assessment  Discharge Assessment     Demographic Factors:  Female, caucasian, married.  Mental Status Per Nursing Assessment::   On Admission:  Suicidal ideation indicated by patient  Current Mental Status by Physician: Patient is alert and oriented to 4. Denies AH/VH/HI. Reports vague suicidal thoughts without a plan or intent.  Loss Factors: Decline in physical health  Historical Factors: Impulsivity  Risk Reduction Factors:   Sense of responsibility to family and Positive social support  Continued Clinical Symptoms:  Depression:   Recent sense of peace/wellbeing  Cognitive Features That Contribute To Risk:  Cognitively intact   Suicide Risk:  Minimal: No identifiable suicidal ideation.  Patients presenting with no risk factors but with morbid ruminations; may be classified as minimal risk based on the severity of the depressive symptoms  Discharge Diagnoses:   AXIS I:  Major Depression, Recurrent severe AXIS II:  Deferred AXIS III:   Past Medical History  Diagnosis Date  . Obesity   . Skin lesion     Excisional biopsy  . Depression     several suicide attempts, hospitaluzed in 2012 for this  . Diabetes mellitus type II   . Dizziness - light-headed   . Sleep apnea     Mild; moderate periodic limb movement disorder  . Polycystic ovary   . Anxiety   . Chronic headaches   . IBS (irritable bowel syndrome)   . History of pneumonia     x 3  . Benign juvenile melanoma   . Colon polyps     found on colonoscopy 04/26/2012   AXIS IV:  other psychosocial or environmental problems AXIS V:  61-70 mild symptoms  Plan Of Care/Follow-up recommendations:  Activity:  normal Diet:  normal Follow up with outpatient appointments.  Is patient on multiple antipsychotic therapies at discharge:  No   Has Patient had three or more failed trials of antipsychotic monotherapy by history:  No  Recommended Plan for Multiple Antipsychotic Therapies: NA  Brayley Mackowiak,  Tabb Croghan 05/14/2012, 9:51 AM

## 2012-05-14 NOTE — Progress Notes (Signed)
Patient ID: Katelyn [name], female   DOB: [birth year], 37 y.o.   MRN: [id number] D: Pt presented with depressed mood and flat affect. Pt endorses passive suicidal ideation but contracts for safety. Pt told writer she wants to be discharged tomorrow, she is not doing better and is not eating well. Pt evening CBG was 66. Pt not exhibiting S/S of hypoglycemia. Pt denies HI/AVH and pain. Pt attended evening karaoke. Cooperative with assessment. No acute distressed noted at this time.   A: Met with pt 1:1. Medications administered as prescribed. Writer gave pt 4oz  juice for low CBG. Pt educated on S/S of hypoglycemia. Writer encouraged pt to discuss feelings. Pt encouraged to come to staff with any question or concerns.  R: Patient remains safe. She is complaint with medications and group programming. Continue current POC.

## 2012-05-14 NOTE — Tx Team (Addendum)
Interdisciplinary Treatment Plan Update (Adult)  Date:  05/14/2012  Time Reviewed:  9:50 AM   Progress in Treatment: Attending groups:   Yes   Participating in groups:  Yes Taking medication as prescribed:  Yes Tolerating medication:  Yes Family/Significant othe contact made: Contact made with family Patient understands diagnosis:  Yes Discussing patient identified problems/goals with staff: Yes Medical problems stabilized or resolved: Yes Denies suicidal/homicidal ideation:Yes Issues/concerns per patient self-inventory:  Other:   New problem(s) identified:  Reason for Continuation of Hospitalization:  Interventions implemented related to continuation of hospitalization:  Additional comments:  Estimated length of stay:  Discharge today  Discharge Plan:  Home with outpatient follow up  New goal(s):  Review of initial/current patient goals per problem list:    1.  Goal(s): Eliminate SI/other thoughts of self harm   Met:  No (Patient endorses chronic SI but no intent to act on thoughts)  Target date: d/c  As evidenced by: Patient will no longer endorse SI/HI or other thoughts of self harm.    2.  Goal (s):Reduce depression/anxiety (rated at seven and nine today)  Patient advised baseline numbers are five and   seven)  Met: Deferred  Target date: d/c  As evidenced by: Patient will rate symptoms at four or below    3.  Goal(s):.stabilize on meds   Met:  Yes  Target date: d/c  As evidenced by: Patient reports being stabilized on medications - less symptomatic    4.  Goal(s): Refer for outpatient follow up   Met:  Yes  Target date: d/c  As evidenced by: Follow up appointment scheduled    Attendees: Patient:   05/14/2012 9:50 AM  Physican:  Patrick North, MD 05/14/2012 9:50 AM  Nursing:  Neill Loft, RN 05/14/2012 9:50 AM   Nursing:   Berneice Heinrich, RN 05/14/2012 9:50 AM   Clinical Social Worker:  Juline Patch, LCSW 05/14/2012 9:50 AM   Other:  Tera Helper, PHM-NP 05/14/2012 9:50 AM   Other:   05/14/2012 9:50 AM Other:        05/14/2012 9:50 AM

## 2012-05-14 NOTE — Progress Notes (Signed)
Adult Psychoeducational Group Note  Date:  05/14/2012 Time:  11:54 AM  Group Topic/Focus:  Dimensions of Wellness:   The focus of this group is to introduce the topic of wellness and discuss the role each dimension of wellness plays in total health.  Participation Level:  None  Participation Quality:  Inattentive  Affect:  Flat  Cognitive:  Alert  Insight: None  Engagement in Group:  Lacking  Modes of Intervention:  Discussion and Education  Additional Comments:    Carlis Blanchard T 05/14/2012, 11:54 AM

## 2012-05-14 NOTE — Progress Notes (Signed)
Adult Psychoeducational Group Note  Date:  05/14/2012 Time:  2000  Group Topic/Focus:  Wrap-Up Group:   The focus of this group is to help patients review their daily goal of treatment and discuss progress on daily workbooks.  Participation Level:  Minimal  Participation Quality:  Appropriate  Affect:  Appropriate  Cognitive:  Appropriate  Insight: Appropriate  Engagement in Group:  Limited  Modes of Intervention:  Activity  Additional Comments:  Pt attended karaoke this evening.   Jane Birkel A 05/14/2012, 12:52 AM

## 2012-05-17 NOTE — Discharge Summary (Signed)
Reviewed

## 2012-05-18 NOTE — Progress Notes (Signed)
Patient Discharge Instructions:  Next Level Care Provider Has Access to the EMR, 05/18/12 Records provided to Dr. Lolly Mustache & Maxcine Ham via [facility]/Epic access  Jerelene Redden, 05/18/2012, 1:51 PM

## 2012-05-20 ENCOUNTER — Ambulatory Visit (INDEPENDENT_AMBULATORY_CARE_PROVIDER_SITE_OTHER): Payer: 59 | Admitting: Psychiatry

## 2012-05-20 ENCOUNTER — Ambulatory Visit (HOSPITAL_COMMUNITY): Payer: Self-pay | Admitting: Psychiatry

## 2012-05-20 DIAGNOSIS — F411 Generalized anxiety disorder: Secondary | ICD-10-CM

## 2012-05-20 DIAGNOSIS — E669 Obesity, unspecified: Secondary | ICD-10-CM
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
## 2012-05-21 ENCOUNTER — Ambulatory Visit (INDEPENDENT_AMBULATORY_CARE_PROVIDER_SITE_OTHER): Payer: 59 | Admitting: Psychiatry

## 2012-05-21 ENCOUNTER — Encounter (HOSPITAL_COMMUNITY): Payer: Self-pay | Admitting: Psychiatry

## 2012-05-21 VITALS — BP 116/68 | HR 71 | Wt 211.0 lb

## 2012-05-21 DIAGNOSIS — F329 Major depressive disorder, single episode, unspecified: Secondary | ICD-10-CM

## 2012-05-21 DIAGNOSIS — F323 Major depressive disorder, single episode, severe with psychotic features: Secondary | ICD-10-CM

## 2012-05-21 NOTE — Progress Notes (Signed)
[facility] [id number] Progress Note  Katelyn [name] [id number] 37 y.o.  05/21/2012 11:27 AM  Chief Complaint:  I am feeling better since his release from the hospital.      History of Present Illness:  Patient is 37 year old female who came for her followup appointment since recently discharged from the hospital.  Patient was admitted on January 9 to January 17.  She was last seen in this office on January 7.  During hospitalization her medications were changed.  She was started on Prozac and Geodon was added in the morning.  She is feeling better since the medicine or change.  She still has anxiety and depression and sometime passive suicidal thoughts but no active plan.  She's to endorse hallucination but they're less intense and less frequent.  She is concerned about her breast discharge but she believe could be due to Geodon.  However she is scheduled to see her endocrinologist and like to discuss more this issue.  She sleeping better but she still has a lot of anxiety.  Despite recommended she's not taking Klonopin.  He's also not taking her blood pressure medication since her blood pressure has been fine.  She denies any tremors or shakes.  She has lost weight in past few weeks since she was not eating however she started to have a better appetite and she has gained some weight from the past.  She is seeing therapist.  She denies any tremors or shakes.  Suicidal Ideation: Yes Plan Formed: No Patient has means to carry out plan: No  Homicidal Ideation: No Plan Formed: No Patient has means to carry out plan: No  Review of Systems: Psychiatric: Agitation: No Hallucination: yes Depressed Mood: yes Insomnia: Yes Hypersomnia: No Altered Concentration: No Feels Worthless: No Grandiose Ideas: No Belief In Special Powers: No New/Increased Substance Abuse: No Compulsions: No  Neurologic: Headache: Yes Seizure: No Paresthesias: No  Past psychiatric History: Patient has  multiple psychiatric admission.  In the past she had tried good response with ECT however she scared to try ECT.  In the past she had tried Cymbalta, Lexapro, Abilify, lithium, Wellbutrin, Lamictal, Ritalin, Risperdal, Neurontin, Vistaril , BuSpar and Valium.  Her last psychiatric admission was this month.    Psychosocial history Patient lives with her husband and children.  Her parents are very supportive who live close by.    Medical history Patient has history of polycystic ovary disease, obesity and chronic pain.  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  Outpatient Encounter Prescriptions as of 05/21/2012  Medication Sig Dispense Refill  . docusate sodium (COLACE) 100 MG capsule Take 2 capsules (200 mg total) by mouth 2 (two) times daily.  60 capsule  0  . FLUoxetine (PROZAC) 20 MG capsule Take 3 capsules (60 mg total) by mouth daily.  90 capsule  0  . metFORMIN (GLUCOPHAGE-XR) 500 MG 24 hr tablet Take 1 tablet (500 mg total) by mouth 2 (two) times daily with a meal.  60 tablet  0  . traZODone (DESYREL) 150 MG tablet Take 1 tablet (150 mg total) by mouth at bedtime as needed for sleep.  30 tablet  0  . ziprasidone (GEODON) 20 MG capsule Take 1 capsule (20 mg total) by mouth every morning.  30 capsule  0  . ziprasidone (GEODON) 80 MG capsule Take 1 capsule (80 mg total) by mouth daily before supper.  30 capsule  0  . aspirin-acetaminophen-caffeine (EXCEDRIN  MIGRAINE) 250-250-65 MG per tablet Take 1 tablet by mouth every 6 (six) hours as needed. For migraine  30 tablet  0  . calcium carbonate (TUMS - DOSED IN MG ELEMENTAL CALCIUM) 500 MG chewable tablet Chew 2 tablets (400 mg of elemental calcium total) by mouth daily as needed. For heartburn  30 tablet  0  . clonazePAM (KLONOPIN) 1 MG tablet Take 1 tablet (1 mg total) by mouth 3 (three) times daily.  21 tablet  0  . lisinopril (PRINIVIL,ZESTRIL) 10 MG tablet Take 1 tablet (10 mg total) by  mouth daily.  30 tablet  0    Past Psychiatric History/Hospitalization(s): Anxiety: Yes Bipolar Disorder: No Depression: Yes Mania: No Psychosis: Yes Schizophrenia: No Personality Disorder: No Hospitalization for psychiatric illness: Yes History of Electroconvulsive Shock Therapy: Yes Prior Suicide Attempts: No  Physical Exam: Constitutional:  BP 116/68  Pulse 71  Wt 211 lb (95.709 kg)  LMP 04/21/2012  General Appearance: well nourished  Musculoskeletal: Strength & Muscle Tone: within normal limits Gait & Station: normal Patient leans: N/A  Mental status examination Patient is casually dressed and fairly groomed.  She is cooperative and maintained fair eye contact.    She described her mood is depressed and her affect is constricted and flat.  She endorse passive suicidal thinking but no plan.  She denies any visual hallucination but endorse occasional auditory hallucination.  There were no flight of ideas or any loose association.  Her speech is slow with decreased volume and tone.  Her thought processes slow with poverty of thought content.  She endorse paranoia and does not feel comfortable leaving her place.  Her attention and concentration is fair.  There were no tremors or shakes. Her insight judgment and impulse control is okay.  Assessment: Axis I: Maj. depressive disorder with psychotic features  Axis II: Deferred  Axis III: Polycystic ovary disease chronic pain and obesity  Axis IV: Moderate  Axis V: 50-55   Plan: I review her chart, recent discharge summary and current medication and side effects.  Reassurance given.  I recommend to see endocrinologist to discuss the issue of post discharge , patient has history of breast discharged from Risperdal however it is very rare but Geodon.  Side effects and benefits discussed in detail.  Recommend to see therapist regularly.  Safety plan discussed again.  We'll consider Geodon to increase if patient is clear from her  endocrinologist.  Also recommend to take Klonopin as needed for anxiety.  Continue to encourage healthy habits including sleep hygiene and watching her calorie intake.  Time spent 25 minutes.  I will see her again in 2 weeks.  Oasis Goehring T., MD 05/21/2012

## 2012-06-03 ENCOUNTER — Ambulatory Visit (INDEPENDENT_AMBULATORY_CARE_PROVIDER_SITE_OTHER): Payer: 59 | Admitting: Psychiatry

## 2012-06-03 DIAGNOSIS — F329 Major depressive disorder, single episode, unspecified: Secondary | ICD-10-CM

## 2012-06-03 DIAGNOSIS — F3289 Other specified depressive episodes: Secondary | ICD-10-CM

## 2012-06-03 DIAGNOSIS — F411 Generalized anxiety disorder: Secondary | ICD-10-CM

## 2012-06-03 NOTE — Progress Notes (Signed)
   THERAPIST PROGRESS NOTE  Session Time: 2:00-2:50 pm  Participation Level: Active  Behavioral Response: Neat, Well Groomed and Hair and make-up doneAlertAnxious  Type of Therapy: Individual Therapy  Treatment Goals addressed: Anxiety  Interventions: CBT, Motivational Interviewing, Solution Focused and Supportive  Summary: Katelyn [name] is a 37 y.o. female who presents with high anxiety and racing thoughts. She denied depression but was frustrated that her mind was racing so quickly and she described feeling "frantic". She said she is not sleeping and pacing around her house. She listed stressors including; her son fearing his upcoming counseling appointment and expressing anger about having to attend it, stress relating to [name] and feeling unhappy in her marriage to him. She said he is more of a friend than a husband and she is uncomfortable being intimate with. She said she is "codependent" and feels she can not function without other people around her. She explored her ambivalence about making any changes with her marriage out of the fears the change would bring. She also stated she was very depressed before she met her husband and she does not think she would be less anxious or depressed without him in her life. She explored coping skills she uses to try to manage the stress and she acknowledges that she functions better with the anxiety than the depression. Patients thoughts were clear and goal directed and despite the anxiety, this was the best patient has presented in a session in a long time. She also mentioned being fearful of her upcoming disability hearing in March.   Suicidal/Homicidal: Nowithout intent/plan  Therapist Response: Assessed level of anxiety and symptoms. Used motivational interviewing to determine triggers and explore motivation for changing stressors pertaining to her marriage with her husband. Explored options if patient left her husband and the  ambivalence associated with leaving. Encouraged patient to attend the support group at [facility] immediatly following session to help reduce some of the anxiety. Recommended exploration of acupuncture for anxiety management.   Plan: Return again in 2 weeks. Reassess anxiety level, follow up regarding her appointment with her lactation specialist and the Geodon causing lactation, patients son's counseling appointment and continue working on teaching anxiety and depression coping skills.   Diagnosis: Axis I: Depression, Major, Recurrent, Severe and Anxiety Disorder    Axis II: No diagnosis    Jaymeson Mengel E, LCSW 06/03/2012

## 2012-06-03 NOTE — Progress Notes (Deleted)
Psychiatric Assessment Adult  Patient Identification:  Katelyn [name] Date of Evaluation:  06/03/2012 Chief Complaint: *** History of Chief Complaint:   Chief Complaint  Patient presents with  . Anxiety    HPI Review of Systems Physical Exam  Depressive Symptoms: {DEPRESSION SYMPTOMS:20000}  (Hypo) Manic Symptoms:   Elevated Mood:  {BHH YES OR NO:22294} Irritable Mood:  {BHH YES OR NO:22294} Grandiosity:  {BHH YES OR NO:22294} Distractibility:  {BHH YES OR NO:22294} Labiality of Mood:  {BHH YES OR NO:22294} Delusions:  {BHH YES OR NO:22294} Hallucinations:  {BHH YES OR NO:22294} Impulsivity:  {BHH YES OR NO:22294} Sexually Inappropriate Behavior:  {BHH YES OR NO:22294} Financial Extravagance:  {BHH YES OR NO:22294} Flight of Ideas:  {BHH YES OR NO:22294}  Anxiety Symptoms: Excessive Worry:  {BHH YES OR NO:22294} Panic Symptoms:  {BHH YES OR NO:22294} Agoraphobia:  {BHH YES OR NO:22294} Obsessive Compulsive: {BHH YES OR NO:22294}  Symptoms: {Obsessive Compulsive Symptoms:22671} Specific Phobias:  {BHH YES OR NO:22294} Social Anxiety:  {BHH YES OR NO:22294}  Psychotic Symptoms:  Hallucinations: {BHH YES OR NO:22294} {Hallucinations:22672} Delusions:  {BHH YES OR NO:22294} Paranoia:  {BHH YES OR NO:22294}   Ideas of Reference:  {BHH YES OR NO:22294}  PTSD Symptoms: Ever had a traumatic exposure:  {BHH YES OR NO:22294} Had a traumatic exposure in the last month:  {BHH YES OR NO:22294} Re-experiencing: {BHH YES OR NO:22294} {Re-experiencing:22673} Hypervigilance:  {BHH YES OR NO:22294} Hyperarousal: {BHH YES OR NO:22294} {Hyperarousal:22674} Avoidance: {BHH YES OR NO:22294} {Avoidance:22675}  Traumatic Brain Injury: {BHH YES OR NO:22294} {Traumatic Brain Injury:22676}  Past Psychiatric History: Diagnosis: ***  Hospitalizations: ***  Outpatient Care: ***  Substance Abuse Care: ***  Self-Mutilation: ***  Suicidal Attempts: ***  Violent Behaviors: ***   Past  Medical History:   Past Medical History  Diagnosis Date  . Obesity   . Skin lesion     Excisional biopsy  . Depression     several suicide attempts, hospitaluzed in 2012 for this  . Diabetes mellitus type II   . Dizziness - light-headed   . Sleep apnea     Mild; moderate periodic limb movement disorder  . Polycystic ovary   . Anxiety   . Chronic headaches   . IBS (irritable bowel syndrome)   . History of pneumonia     x 3  . Benign juvenile melanoma   . Colon polyps     found on colonoscopy 04/26/2012   History of Loss of Consciousness:  {BHH YES OR NO:22294} Seizure History:  {BHH YES OR NO:22294} Cardiac History:  {BHH YES OR NO:22294} Allergies:   Allergies  Allergen Reactions  . Lithium     Catatonic state  . Penicillins     REACTION: Rash   Current Medications:  Current Outpatient Prescriptions  Medication Sig Dispense Refill  . aspirin-acetaminophen-caffeine (EXCEDRIN MIGRAINE) 250-250-65 MG per tablet Take 1 tablet by mouth every 6 (six) hours as needed. For migraine  30 tablet  0  . calcium carbonate (TUMS - DOSED IN MG ELEMENTAL CALCIUM) 500 MG chewable tablet Chew 2 tablets (400 mg of elemental calcium total) by mouth daily as needed. For heartburn  30 tablet  0  . clonazePAM (KLONOPIN) 1 MG tablet Take 1 tablet (1 mg total) by mouth 3 (three) times daily.  21 tablet  0  . docusate sodium (COLACE) 100 MG capsule Take 2 capsules (200 mg total) by mouth 2 (two) times daily.  60 capsule  0  . FLUoxetine (PROZAC) 20 MG  capsule Take 3 capsules (60 mg total) by mouth daily.  90 capsule  0  . lisinopril (PRINIVIL,ZESTRIL) 10 MG tablet Take 1 tablet (10 mg total) by mouth daily.  30 tablet  0  . metFORMIN (GLUCOPHAGE-XR) 500 MG 24 hr tablet Take 1 tablet (500 mg total) by mouth 2 (two) times daily with a meal.  60 tablet  0  . traZODone (DESYREL) 150 MG tablet Take 1 tablet (150 mg total) by mouth at bedtime as needed for sleep.  30 tablet  0  . ziprasidone (GEODON) 20  MG capsule Take 1 capsule (20 mg total) by mouth every morning.  30 capsule  0  . ziprasidone (GEODON) 80 MG capsule Take 1 capsule (80 mg total) by mouth daily before supper.  30 capsule  0  . [DISCONTINUED] clonazePAM (KLONOPIN) 1 MG tablet Take 1 tablet (1 mg total) by mouth 3 (three) times daily.  90 tablet  0    Previous Psychotropic Medications:  Medication Dose   ***  ***                     Substance Abuse History in the last 12 months: Substance Age of 1st Use Last Use Amount Specific Type  Nicotine  ***  ***  ***  ***  Alcohol  ***  ***  ***  ***  Cannabis  ***  ***  ***  ***  Opiates  ***  ***  ***  ***  Cocaine  ***  ***  ***  ***  Methamphetamines  ***  ***  ***  ***  LSD  ***  ***  ***  ***  Ecstasy  ***   ***  ***  ***  Benzodiazepines  ***  ***  ***  ***  Caffeine  ***  ***  ***  ***  Inhalants  ***  ***  ***  ***  Others:                          Medical Consequences of Substance Abuse: ***  Legal Consequences of Substance Abuse: ***  Family Consequences of Substance Abuse: ***  Blackouts:  {BHH YES OR NO:22294} DT's:  {BHH YES OR NO:22294} Withdrawal Symptoms:  {BHH YES OR NO:22294} {Withdrawal Symptoms:22677}  Social History: Current Place of Residence: *** Place of Birth: *** Family Members: *** Marital Status:  {Marital Status:22678} Children: ***  Sons: ***  Daughters: *** Relationships: *** Education:  {Education:22679} Educational Problems/Performance: *** Religious Beliefs/Practices: *** History of Abuse: {Desc; abuse:16542} Occupational Experiences; Military History:  {Military History:22680} Legal History: *** Hobbies/Interests: ***  Family History:   Family History  Problem Relation Age of Onset  . Hypertension Mother     Iterstitial Cystist  . Hyperlipidemia Mother   . Depression Brother   . Alcohol abuse Brother   . Colon polyps Father   . Depression Father   . Irritable bowel syndrome Father   . Colon cancer  Paternal Aunt 34  . Heart attack Paternal Grandfather   . Kidney cancer Paternal Grandfather   . Cancer Maternal Grandfather     unknown type    Mental Status Examination/Evaluation: Objective:  Appearance: {Appearance:22683}  Eye Contact::  {BHH EYE CONTACT:22684}  Speech:  {Speech:22685}  Volume:  {Volume (PAA):22686}  Mood:  ***  Affect:  {Affect (PAA):22687}  Thought Process:  {Thought Process (PAA):22688}  Orientation:  {BHH ORIENTATION (PAA):22689}  Thought Content:  {Thought Content:22690}  Suicidal Thoughts:  {ST/HT (PAA):22692}  Homicidal Thoughts:  {ST/HT (PAA):22692}  Judgement:  {Judgement (PAA):22694}  Insight:  {Insight (PAA):22695}  Psychomotor Activity:  {Psychomotor (PAA):22696}  Akathisia:  {BHH YES OR NO:22294}  Handed:  {Handed:22697}  AIMS (if indicated):  ***  Assets:  {Assets (PAA):22698}    Laboratory/X-Ray Psychological Evaluation(s)   ***  ***   Assessment:  {axis diagnosis:3049000}  AXIS I {psych axis 1:31909}  AXIS II {psych axis 2:31910}  AXIS III Past Medical History  Diagnosis Date  . Obesity   . Skin lesion     Excisional biopsy  . Depression     several suicide attempts, hospitaluzed in 2012 for this  . Diabetes mellitus type II   . Dizziness - light-headed   . Sleep apnea     Mild; moderate periodic limb movement disorder  . Polycystic ovary   . Anxiety   . Chronic headaches   . IBS (irritable bowel syndrome)   . History of pneumonia     x 3  . Benign juvenile melanoma   . Colon polyps     found on colonoscopy 04/26/2012     AXIS IV {psych axis iv:31915}  AXIS V {psych axis v score:31919}   Treatment Plan/Recommendations:  Plan of Care: ***  Laboratory:  {Laboratory:22682}  Psychotherapy: ***  Medications: ***  Routine PRN Medications:  {BHH YES OR NO:22294}  Consultations: ***  Safety Concerns:  ***  OtherCarman Ching, LCSW 2/6/20143:05 PM

## 2012-06-11 ENCOUNTER — Ambulatory Visit (HOSPITAL_COMMUNITY): Payer: Self-pay | Admitting: Psychiatry

## 2012-06-14 ENCOUNTER — Other Ambulatory Visit (HOSPITAL_COMMUNITY): Payer: Self-pay | Admitting: Psychiatry

## 2012-06-15 ENCOUNTER — Other Ambulatory Visit (HOSPITAL_COMMUNITY): Payer: Self-pay | Admitting: Psychiatry

## 2012-06-15 MED ORDER — FLUOXETINE HCL 20 MG PO CAPS: 60.0000 mg | ORAL_CAPSULE | Freq: Every day | ORAL | Status: DC

## 2012-06-17 ENCOUNTER — Other Ambulatory Visit (HOSPITAL_COMMUNITY): Payer: Self-pay | Admitting: Psychiatry

## 2012-06-17 ENCOUNTER — Ambulatory Visit (INDEPENDENT_AMBULATORY_CARE_PROVIDER_SITE_OTHER): Payer: 59 | Admitting: Psychiatry

## 2012-06-17 DIAGNOSIS — F333 Major depressive disorder, recurrent, severe with psychotic symptoms: Secondary | ICD-10-CM | POA: Insufficient documentation

## 2012-06-17 DIAGNOSIS — F411 Generalized anxiety disorder: Secondary | ICD-10-CM

## 2012-06-17 MED ORDER — ZIPRASIDONE HCL 20 MG PO CAPS: 20.0000 mg | ORAL_CAPSULE | ORAL | Status: DC

## 2012-06-17 NOTE — Progress Notes (Signed)
   THERAPIST PROGRESS NOTE  Session Time: 2:00-2:50 pm  Participation Level: Active  Behavioral Response: Neat and Well GroomedAlertAnxious  Type of Therapy: Individual Therapy  Treatment Goals addressed: Anxiety and Coping  Interventions: CBT, Solution Focused and Supportive  Summary: Katelyn [name] is a 37 y.o. female who presents with severe anxious mood and affect at the same level as previous session. Patient denies active depression symptoms but states her anxiety feels unbearable at times and had a severe panic attack yesterday at her childrens school that involved throwing up from the stress level. Patient described racing thoughts and feeling overwhelmed by "everything" and finding it difficult to accomplish things. Patient states she has not been taking her Trazadone at night reguarly or her Klonopin as prescribed due to "not wanting to take more medications". Patient participated in a discussion on the benefits and consequences of taking hte medications vs allowing her body to become that anxious and patient agreed that it was probably very unhealhty to allow her body to become so anixous she throws up. Patient agreed to a trail period of one week of taking all medications as prescribed to review in the next session. Patient expressed fear associated with her sons upcoming counseling appointed and requested support from writer on how to make his transition to his first appointment a [facility] one. Patient also expressed frustration with her marriage and stated that she had a wonderful week with her husband last week and stated it was "the best time she has ever had with him". She said he then returned to his mean behaviors this week. She is hoping to attend his next doctors appointment with him to explore other mental health medications for mood stabilization in an attempt to work on their marriage.    Suicidal/Homicidal: Nowithout intent/plan  Therapist Response: Assessed patients  level of depression and anxiety per patient report. Reviewed mood symptoms and possible causes. Educated patient on ways to talk with her son about his counseling appointment to ease his anxiety about the visit and processed stress related to problems in patients marriage.   Plan: Return again in 1 weeks. Review patients goal of taking medications as prescribed for one week.   Diagnosis: Axis I: Anxiety Disorder NOS and Depression, Major, Recurrent, Severe with psychosis    Axis II: No diagnosis    Noela Brothers E, LCSW 06/17/2012

## 2012-06-22 ENCOUNTER — Encounter (HOSPITAL_COMMUNITY): Payer: Self-pay | Admitting: Psychiatry

## 2012-06-22 ENCOUNTER — Ambulatory Visit (INDEPENDENT_AMBULATORY_CARE_PROVIDER_SITE_OTHER): Payer: 59 | Admitting: Psychiatry

## 2012-06-22 VITALS — Wt 216.0 lb

## 2012-06-22 DIAGNOSIS — F329 Major depressive disorder, single episode, unspecified: Secondary | ICD-10-CM

## 2012-06-22 MED ORDER — ZIPRASIDONE HCL 20 MG PO CAPS: 20.0000 mg | ORAL_CAPSULE | ORAL | Status: DC

## 2012-06-22 MED ORDER — ZIPRASIDONE HCL 80 MG PO CAPS: 80.0000 mg | ORAL_CAPSULE | Freq: Every day | ORAL | Status: DC

## 2012-06-22 MED ORDER — CLONAZEPAM 1 MG PO TABS: 1.0000 mg | ORAL_TABLET | Freq: Three times a day (TID) | ORAL | Status: DC

## 2012-06-22 MED ORDER — FLUOXETINE HCL 20 MG PO CAPS: 60.0000 mg | ORAL_CAPSULE | Freq: Every day | ORAL | Status: DC

## 2012-06-22 MED ORDER — TRAZODONE HCL 150 MG PO TABS: 150.0000 mg | ORAL_TABLET | Freq: Every evening | ORAL | Status: DC | PRN

## 2012-06-22 NOTE — Progress Notes (Signed)
[facility] [id number] Progress Note  Katelyn [name] [id number] 37 y.o.  06/22/2012 11:03 AM  Chief Complaint:  I am feeling better since taking the medication regularly.        History of Present Illness:  Patient is 37 year old female who came for her followup appointment.  Patient is taking her medication regularly.  She admitted there are times when she quit taking Klonopin and also Geodon in the morning but then she started to feel very anxious depressed and realized that she need to take the medication as prescribed.  She brought her son today for the first time for counseling.  Patient admitted son has been decompensated and she was thinking to bring him for counseling.  She also brought recent blood work including prolactin level which is normal.  Patient is still has some milk discharge from breast but she is relieved that her prolactin level is normal.  Her physician suggested to take medication for breast lactation suppression and she is considering about it.  All her other blood results are normal.  She is anxious about upcoming disability hearing next week.  However she sleeping better with the medication.  She denies any agitation anger mood swing.  She denies any hallucination or any recent paranoia.  She has no tremors or shakes.  She is seeing therapist.  She wants to continue her current medication as prescribed.  She's not drinking or using any illegal substance. She has gained weight from her last visit.  She admitted more eating but also her physician decreased metformin 500 mg twice a day to once a day as her hemoglobin A1c is 4.9.  Suicidal Ideation: No Plan Formed: No Patient has means to carry out plan: No  Homicidal Ideation: No Plan Formed: No Patient has means to carry out plan: No  Review of Systems: Psychiatric: Agitation: No Hallucination: yes Depressed Mood: yes Insomnia: Yes Hypersomnia: No Altered Concentration: No Feels Worthless: No Grandiose  Ideas: No Belief In Special Powers: No New/Increased Substance Abuse: No Compulsions: No  Neurologic: Headache: Yes Seizure: No Paresthesias: No  Past psychiatric History: Patient has multiple psychiatric admission.  In the past she had tried good response with ECT however she scared to try ECT.  In the past she had tried Cymbalta, Lexapro, Abilify, lithium, Wellbutrin, Lamictal, Ritalin, Risperdal, Neurontin, Vistaril , BuSpar and Valium.  Her last psychiatric admission was this month.    Psychosocial history Patient lives with her husband and children.  Her parents are very supportive who live close by.    Medical history Patient has history of polycystic ovary disease, obesity and chronic pain.  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  Outpatient Encounter Prescriptions as of 06/22/2012  Medication Sig Dispense Refill  . aspirin-acetaminophen-caffeine (EXCEDRIN MIGRAINE) 250-250-65 MG per tablet Take 1 tablet by mouth every 6 (six) hours as needed. For migraine  30 tablet  0  . calcium carbonate (TUMS - DOSED IN MG ELEMENTAL CALCIUM) 500 MG chewable tablet Chew 2 tablets (400 mg of elemental calcium total) by mouth daily as needed. For heartburn  30 tablet  0  . clonazePAM (KLONOPIN) 1 MG tablet Take 1 tablet (1 mg total) by mouth 3 (three) times daily.  90 tablet  0  . docusate sodium (COLACE) 100 MG capsule Take 2 capsules (200 mg total) by mouth 2 (two) times daily.  60 capsule  0  . FLUoxetine (PROZAC) 20 MG capsule Take 3  capsules (60 mg total) by mouth daily.  90 capsule  0  . lisinopril (PRINIVIL,ZESTRIL) 10 MG tablet Take 1 tablet (10 mg total) by mouth daily.  30 tablet  0  . metFORMIN (GLUCOPHAGE) 500 MG tablet Take 500 mg by mouth daily with breakfast.      . ziprasidone (GEODON) 20 MG capsule Take 1 capsule (20 mg total) by mouth every morning.  30 capsule  0  . ziprasidone (GEODON) 80 MG capsule Take 1 capsule (80 mg  total) by mouth daily before supper.  30 capsule  0  . [DISCONTINUED] clonazePAM (KLONOPIN) 1 MG tablet Take 1 tablet (1 mg total) by mouth 3 (three) times daily.  21 tablet  0  . [DISCONTINUED] FLUoxetine (PROZAC) 20 MG capsule Take 3 capsules (60 mg total) by mouth daily.  90 capsule  0  . [DISCONTINUED] metFORMIN (GLUCOPHAGE-XR) 500 MG 24 hr tablet Take 1 tablet (500 mg total) by mouth 2 (two) times daily with a meal.  60 tablet  0  . [DISCONTINUED] ziprasidone (GEODON) 20 MG capsule Take 1 capsule (20 mg total) by mouth every morning.  5 capsule  0  . [DISCONTINUED] ziprasidone (GEODON) 80 MG capsule Take 1 capsule (80 mg total) by mouth daily before supper.  30 capsule  0  . traZODone (DESYREL) 150 MG tablet Take 1 tablet (150 mg total) by mouth at bedtime as needed for sleep.  30 tablet  0  . [DISCONTINUED] traZODone (DESYREL) 150 MG tablet Take 1 tablet (150 mg total) by mouth at bedtime as needed for sleep.  30 tablet  0   No facility-administered encounter medications on file as of 06/22/2012.    Past Psychiatric History/Hospitalization(s): Anxiety: Yes Bipolar Disorder: No Depression: Yes Mania: No Psychosis: Yes Schizophrenia: No Personality Disorder: No Hospitalization for psychiatric illness: Yes History of Electroconvulsive Shock Therapy: Yes Prior Suicide Attempts: No  Physical Exam: Constitutional:  Wt 216 lb (97.977 kg)  BMI 34.88 kg/m2  General Appearance: well nourished  Musculoskeletal: Strength & Muscle Tone: within normal limits Gait & Station: normal Patient leans: N/A  Mental status examination Patient is casually dressed and fairly groomed.  She is cooperative and maintained fair eye contact.    She described her mood is anxious and her affect is mood appropriate.  She  denies any active or passive suicidal thinking  or homicidal thinking.  She denies any visual hallucination but endorse occasional auditory hallucination.  There were no flight of ideas or  any loose association.  Her speech is slow with decreased volume and tone.  Her thought processes slow with poverty of thought content.  She endorse paranoia and does not feel comfortable leaving her place.  Her attention and concentration is fair.  There were no tremors or shakes. Her insight judgment and impulse control is okay.  Assessment: Axis I: Maj. depressive disorder with psychotic features  Axis II: Deferred  Axis III: Polycystic ovary disease chronic pain and obesity  Axis IV: Moderate  Axis V: 50-55   Plan: I review her chart, recent  blood results which shows normal prolactin level and hemoglobin A1c 4.9.  Her cholesterol and basic chemistry was also normal.  I recommend to continue her present dose of psychotropic medication and excessive encouragement given for compliance.  She will see therapist for coping and social skills.  Risk and benefits of medication explain in detail.  I will see her again in 4 weeks.  She'll continue Prozac 60 mg, Geodon 20 mg in  the morning and 80 mg at bedtime, Klonopin 1 mg 3 times a day and trazodone 150 mg at bedtime. Safety plan discussed again.  at this time we will defer increasing any Geodon. Continue to encourage healthy habits including sleep hygiene and watching her calorie intake.  Time spent 25 minutes.  I will see her again in 4 weeks.  Seyed Heffley T., MD 06/22/2012

## 2012-06-24 ENCOUNTER — Ambulatory Visit (INDEPENDENT_AMBULATORY_CARE_PROVIDER_SITE_OTHER): Payer: 59 | Admitting: Psychiatry

## 2012-06-24 DIAGNOSIS — F411 Generalized anxiety disorder: Secondary | ICD-10-CM

## 2012-06-24 DIAGNOSIS — F333 Major depressive disorder, recurrent, severe with psychotic symptoms: Secondary | ICD-10-CM

## 2012-06-24 NOTE — Progress Notes (Signed)
   THERAPIST PROGRESS NOTE  Session Time: 2:00-2:50 pm  Participation Level: Active  Behavioral Response: Neat and Well GroomedAlertAnxious  Type of Therapy: Individual Therapy  Treatment Goals addressed: Anxiety and Coping  Interventions: CBT and Solution Focused  Summary: Katelyn [name] is a 37 y.o. female who presents with high anxious mood and affect. Patient stated at the start of the session that she would need structure and redirection due to racing thoughts and inability to focus. Patient listed the three items for discussion in the session 1) her upcoming disability hearing and anxiety associated with it, 2) her sons recent therapy session and 3) her homework of taking the medication as prescribed. Patient states she did take her medication as prescribed but then stopped the Trazadone after one week because her neighbor, [name], noticed a change in patient for taking the medicaiton and gave patient an ultimadem that if she continues taking that medication, patient can't see her anymore. Patient expressed concerns about this and about "feeling different" and less "motivated" when she takes it, although said it gives her a full nights sleep and she feels calmer. Patient considered the posilibyt that the medication might be making her feel "normal" and that feel is different for her because it is not the accelerated feeling she is used to. Patient is uncertain if she plans to continue taking it. Patient continues to be obsessed about her weight and is engaging in binging and not eating to manage difficult emotions. Patient states she is trying new coping skills and created a safe place in her bedroom with a rocking chair and calming elements that she has found helpful in anxiety management. Patient expressed fears about her upcoming disability hearing and how worried she is that it won't be approved.   Suicidal/Homicidal: Nowithout intent/plan  Therapist Response: Assessed overall level of  functioning, reviewed homework assignment, explored behavioral tactics patient is using to reduce anxiety and depression symptoms, processed ways to ease anxiety regarding upcoming disability hearing.   Plan: Return again in 1 weeks. Gave patient her documentation that was requested for her disability hearing and faxed the paperwork to the attorney per patients request.   Diagnosis: Axis I: Anxiety Disorder NOS and Depression, Major, Recurrent, Severe with psychosis    Axis II: No diagnosis    Carman Ching, LCSW 06/24/2012

## 2012-07-01 ENCOUNTER — Encounter: Payer: Self-pay | Admitting: Family Medicine

## 2012-07-01 ENCOUNTER — Ambulatory Visit (INDEPENDENT_AMBULATORY_CARE_PROVIDER_SITE_OTHER): Payer: Medicare Other | Admitting: Psychiatry

## 2012-07-01 ENCOUNTER — Ambulatory Visit (INDEPENDENT_AMBULATORY_CARE_PROVIDER_SITE_OTHER): Payer: 59 | Admitting: Family Medicine

## 2012-07-01 VITALS — BP 118/72 | HR 70 | Resp 18 | Ht 66.0 in | Wt 209.1 lb

## 2012-07-01 DIAGNOSIS — R519 Headache, unspecified: Secondary | ICD-10-CM | POA: Insufficient documentation

## 2012-07-01 DIAGNOSIS — E669 Obesity, unspecified: Secondary | ICD-10-CM

## 2012-07-01 DIAGNOSIS — R51 Headache: Secondary | ICD-10-CM

## 2012-07-01 DIAGNOSIS — G8929 Other chronic pain: Secondary | ICD-10-CM | POA: Insufficient documentation

## 2012-07-01 DIAGNOSIS — F333 Major depressive disorder, recurrent, severe with psychotic symptoms: Secondary | ICD-10-CM

## 2012-07-01 NOTE — Progress Notes (Deleted)
    Daily Group Progress Note  Program: IOP  Group Time: 9:00-10:30 am   Participation Level: {CHL AMB BH Group Participation:21022742}  Behavioral Response: {CHL AMB BH Group Behavior:21022743}  Type of Therapy:  {CHL AMB BH Type of Therapy:21022741}  Summary of Progress: ***     Group Time:   Participation Level:  {CHL AMB BH Group Participation:21022742}  Behavioral Response: {CHL AMB BH Group Behavior:21022743}  Type of Therapy: {CHL AMB BH Type of Therapy:21022741}  Summary of Progress: ***  ENGLEHORN,SHANNON E, LCSW

## 2012-07-01 NOTE — Progress Notes (Signed)
  Subjective:    Patient ID: Katelyn [name], female    DOB: 1975/08/31, 37 y.o.   MRN: [id number]  HPI Pt in for follow up . She c/o of daily headaches for months, frontal and on the crown of her head, she has been using compresses and aromatherapy, wonders if there are any other options. No interest in headache clinic, major trust issues , by her report. No localized weakness or numbness, she relates the headaches to ECT , however is very open to the possibility that this may not at all be the case. States tha t2 days agio she nearly committted suicide, is convinced that life would be better for everyone including her family and her children if she were dead. Not currently suicidal. Has afternoon appt with her therapist, has considered re hospitalization, and allows me to leave a message on the therapist's phone about her current state. Very unhealthy relationship with food currently. Avoids kitchen, doesn't eat, feels as though she is extremely big, binging and purging , which she has done in the past. Recent labs are excellent, and she got her disability approved 3 days ago   Review of Systems See HPI Denies recent fever or chills. Denies sinus pressure, nasal congestion, ear pain or sore throat. Denies chest congestion, productive cough or wheezing. Denies chest pains, palpitations and leg swelling Denies abdominal pain, nausea, vomiting,diarrhea or constipation.   Denies dysuria, frequency, hesitancy or incontinence. Denies joint pain, swelling and limitation in mobility.  Denies skin break down or rash.        Objective:   Physical Exam  Patient alert and oriented and in no cardiopulmonary distress.  HEENT: No facial asymmetry, EOMI, no sinus tenderness,  oropharynx pink and moist.  Neck supple no adenopathy.  Chest: Clear to auscultation bilaterally.  CVS: S1, S2 no murmurs, no S3.  ABD: Soft non tender. Bowel sounds normal.  Ext: No edema  Psych: Good eye contact,  blunted affect. Memory intact  depressed appearing.  CNS: CN 2-12 intact, power,  throughout.       Assessment & Plan:

## 2012-07-01 NOTE — Assessment & Plan Note (Signed)
Unchanged for month, no aura, no focal neurologic symptoms or signs No interest in pursuing medication management of this at this time

## 2012-07-01 NOTE — Assessment & Plan Note (Signed)
Currently uncontrolled, I believe pt needs re hospitalization based on current report, she does have a scheduled appt with her therapist today. Recently took i 2 cats which are therapeutic , yesterday, I have encouraged return to [place] and to start craft, eg knitting

## 2012-07-01 NOTE — Patient Instructions (Addendum)
Congrats on excellent labs.  Please keep today's appointment and follow through on things we discussed in terms of group, , starting a craft , and enjoy your cats  Keep active, and continue what you are doing for headache management  F/U in 3.5 month, please call if you need me before.  I am thankful that you have your disability  Call if headaches worsen for referral to headache specialist

## 2012-07-01 NOTE — Progress Notes (Signed)
   THERAPIST PROGRESS NOTE  Session Time: 2:00-2:50 pm  Participation Level: Active  Behavioral Response: CasualAlertDepressed  Type of Therapy: Individual Therapy  Treatment Goals addressed: Coping  Interventions: Other: sucide assessment  Summary: Katelyn [name] is a 37 y.o. female who presents with severe depressed mood and affect. Writer received a phone call from patients primary care doctor this morning expressing concerns about the high level of depression. Patient admitted she is struggling and had thoughts of suicide this past Tuesday. She states she still has thoughts today, but does not have a plan or means and contracted for safety until tomorrow at 1:00 pm when patient agreed to Higher education careers adviser by phone for a reassessment of symptoms. Patient reports severe episodes of binging and not eating and has been throwing the food out of the house to where her mother has been brining food over for patients children to eat. Patient states she cant stand to look at food and questions if she will ever have a healthy relationship with food again. Patient agreed to contact Noni Saupe, a therapist who specializes in eating disorders, tomorrow to schedule an appointment with her. Patient states she has been isolating in her room and unable to function with her family and household responsibilities. She feels like a burden to her family and friends. She states she got two cats this week because "she needed someone to love her". Now she regrets that decision. Patient agreed to Higher education careers adviser tomorrow to further explore crisis planning.   Suicidal/Homicidal: Yeswithout intent/plan  Therapist Response: Assessed for safety, completed safety plan.  Plan: Writer will contact patient tomorrow over the phone to further assess for safety.    Diagnosis: Axis I: Depression, Major, Recurrent, Severe with psychosis    Axis II: No diagnosis    Katelyn [name] E, LCSW 07/01/2012

## 2012-07-01 NOTE — Assessment & Plan Note (Signed)
Continued weight loss, unfortunately , in an unhealthy manner to some extent Pt encouraged to be diligent in providing appropriate nutrition to her body

## 2012-07-05 ENCOUNTER — Ambulatory Visit (INDEPENDENT_AMBULATORY_CARE_PROVIDER_SITE_OTHER): Payer: 59 | Admitting: Psychiatry

## 2012-07-05 ENCOUNTER — Encounter (HOSPITAL_COMMUNITY): Payer: Self-pay

## 2012-07-05 ENCOUNTER — Inpatient Hospital Stay (HOSPITAL_COMMUNITY)
Admission: AD | Admit: 2012-07-05 | Discharge: 2012-07-12 | DRG: 885 | Disposition: A | Discharge status: Home / Self Care | Payer: 59 | Source: Ambulatory Visit | Attending: Psychiatry | Admitting: Psychiatry

## 2012-07-05 DIAGNOSIS — R7301 Impaired fasting glucose: Secondary | ICD-10-CM

## 2012-07-05 DIAGNOSIS — R030 Elevated blood-pressure reading, without diagnosis of hypertension: Secondary | ICD-10-CM

## 2012-07-05 DIAGNOSIS — F333 Major depressive disorder, recurrent, severe with psychotic symptoms: Principal | ICD-10-CM | POA: Diagnosis present

## 2012-07-05 DIAGNOSIS — F411 Generalized anxiety disorder: Secondary | ICD-10-CM

## 2012-07-05 DIAGNOSIS — E669 Obesity, unspecified: Secondary | ICD-10-CM

## 2012-07-05 DIAGNOSIS — R42 Dizziness and giddiness: Secondary | ICD-10-CM

## 2012-07-05 DIAGNOSIS — Z79899 Other long term (current) drug therapy: Secondary | ICD-10-CM

## 2012-07-05 DIAGNOSIS — R002 Palpitations: Secondary | ICD-10-CM

## 2012-07-05 DIAGNOSIS — H547 Unspecified visual loss: Secondary | ICD-10-CM

## 2012-07-05 DIAGNOSIS — F509 Eating disorder, unspecified: Secondary | ICD-10-CM

## 2012-07-05 DIAGNOSIS — Z8371 Family history of colonic polyps: Secondary | ICD-10-CM

## 2012-07-05 DIAGNOSIS — E282 Polycystic ovarian syndrome: Secondary | ICD-10-CM

## 2012-07-05 DIAGNOSIS — R5383 Other fatigue: Secondary | ICD-10-CM

## 2012-07-05 DIAGNOSIS — G8929 Other chronic pain: Secondary | ICD-10-CM

## 2012-07-05 DIAGNOSIS — Z85828 Personal history of other malignant neoplasm of skin: Secondary | ICD-10-CM

## 2012-07-05 MED ORDER — ZIPRASIDONE HCL 20 MG PO CAPS
20.0000 mg | ORAL_CAPSULE | Freq: Every day | ORAL | Status: DC
  Administered 2012-07-06 – 2012-07-07 (×2): 20 mg via ORAL
  Filled 2012-07-05 (×4): qty 1

## 2012-07-05 MED ORDER — FLUOXETINE HCL 20 MG PO CAPS
60.0000 mg | ORAL_CAPSULE | Freq: Every day | ORAL | Status: DC
  Administered 2012-07-06: 60 mg via ORAL
  Filled 2012-07-05 (×3): qty 3

## 2012-07-05 MED ORDER — TRAZODONE HCL 50 MG PO TABS
150.0000 mg | ORAL_TABLET | Freq: Every evening | ORAL | Status: DC | PRN
  Administered 2012-07-06: 150 mg via ORAL
  Filled 2012-07-05 (×2): qty 1

## 2012-07-05 MED ORDER — ADULT MULTIVITAMIN W/MINERALS CH
1.0000 | ORAL_TABLET | Freq: Every day | ORAL | Status: DC
  Administered 2012-07-06 – 2012-07-12 (×7): 1 via ORAL
  Filled 2012-07-05 (×9): qty 1

## 2012-07-05 MED ORDER — THIAMINE HCL 100 MG/ML IJ SOLN: 100.0000 mg | Freq: Once | INTRAMUSCULAR | Status: DC

## 2012-07-05 MED ORDER — ONDANSETRON 4 MG PO TBDP: 4.0000 mg | ORAL_TABLET | Freq: Four times a day (QID) | ORAL | Status: AC | PRN

## 2012-07-05 MED ORDER — ZIPRASIDONE HCL 80 MG PO CAPS
80.0000 mg | ORAL_CAPSULE | Freq: Every day | ORAL | Status: DC
  Administered 2012-07-05 – 2012-07-11 (×7): 80 mg via ORAL
  Filled 2012-07-05 (×11): qty 1

## 2012-07-05 MED ORDER — HYDROXYZINE HCL 25 MG PO TABS: 25.0000 mg | ORAL_TABLET | Freq: Four times a day (QID) | ORAL | Status: AC | PRN

## 2012-07-05 MED ORDER — METFORMIN HCL 500 MG PO TABS
500.0000 mg | ORAL_TABLET | Freq: Every day | ORAL | Status: DC
  Administered 2012-07-06 – 2012-07-07 (×2): 500 mg via ORAL
  Filled 2012-07-05 (×4): qty 1

## 2012-07-05 MED ORDER — DOCUSATE SODIUM 100 MG PO CAPS
100.0000 mg | ORAL_CAPSULE | Freq: Two times a day (BID) | ORAL | Status: DC
  Administered 2012-07-05 – 2012-07-06 (×2): 100 mg via ORAL
  Filled 2012-07-05 (×6): qty 1

## 2012-07-05 MED ORDER — ASPIRIN-ACETAMINOPHEN-CAFFEINE 250-250-65 MG PO TABS
1.0000 | ORAL_TABLET | Freq: Four times a day (QID) | ORAL | Status: DC | PRN
  Administered 2012-07-05: 1 via ORAL
  Filled 2012-07-05: qty 1

## 2012-07-05 MED ORDER — LOPERAMIDE HCL 2 MG PO CAPS: 2.0000 mg | ORAL_CAPSULE | ORAL | Status: AC | PRN

## 2012-07-05 MED ORDER — MAGNESIUM HYDROXIDE 400 MG/5ML PO SUSP: 30.0000 mL | Freq: Every day | ORAL | Status: DC | PRN

## 2012-07-05 MED ORDER — ALUM & MAG HYDROXIDE-SIMETH 200-200-20 MG/5ML PO SUSP: 30.0000 mL | ORAL | Status: DC | PRN

## 2012-07-05 MED ORDER — CHLORDIAZEPOXIDE HCL 25 MG PO CAPS
25.0000 mg | ORAL_CAPSULE | Freq: Once | ORAL | Status: AC
  Administered 2012-07-05: 25 mg via ORAL
  Filled 2012-07-05: qty 1

## 2012-07-05 MED ORDER — VITAMIN B-1 100 MG PO TABS
100.0000 mg | ORAL_TABLET | Freq: Every day | ORAL | Status: DC
  Administered 2012-07-06 – 2012-07-12 (×7): 100 mg via ORAL
  Filled 2012-07-05 (×9): qty 1

## 2012-07-05 MED ORDER — CHLORDIAZEPOXIDE HCL 25 MG PO CAPS: 25.0000 mg | ORAL_CAPSULE | Freq: Four times a day (QID) | ORAL | Status: DC | PRN

## 2012-07-05 MED ORDER — ACETAMINOPHEN 325 MG PO TABS: 650.0000 mg | ORAL_TABLET | Freq: Four times a day (QID) | ORAL | Status: DC | PRN

## 2012-07-05 MED ORDER — CALCIUM CARBONATE ANTACID 500 MG PO CHEW
1000.0000 mg | CHEWABLE_TABLET | Freq: Four times a day (QID) | ORAL | Status: DC | PRN
  Filled 2012-07-05: qty 2

## 2012-07-05 NOTE — Tx Team (Signed)
Initial Interdisciplinary Treatment Plan  PATIENT STRENGTHS: (choose at least two) Ability for insight Active sense of humor Average or above average intelligence Capable of independent living Communication skills General fund of knowledge Supportive family/friends  PATIENT STRESSORS: Medication change or noncompliance   PROBLEM LIST: Problem List/Patient Goals Date to be addressed Date deferred Reason deferred Estimated date of resolution  Pt. Denies SI but told her therapist that she took 3 klonipin Friday night so she could sleep.  Pt. Reports she has been SI in past .      Writer ask pt. What we could do to help and she does admit that she has experienced incr. Depression and does not feel like her medication is working as good as it did in the past.                                                 DISCHARGE CRITERIA:  Ability to meet basic life and health needs Improved stabilization in mood, thinking, and/or behavior Motivation to continue treatment in a less acute level of care Need for constant or close observation no longer present Verbal commitment to aftercare and medication compliance  PRELIMINARY DISCHARGE PLAN: Outpatient therapy Participate in family therapy Return to previous living arrangement  PATIENT/FAMIILY INVOLVEMENT: This treatment plan has been presented to and reviewed with the patient, ADAMARYS SHALL, and/or family member, .  The patient and family have been given the opportunity to ask questions and make suggestions.  Cooper Render 07/05/2012, 8:09 PM
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
## 2012-07-05 NOTE — Progress Notes (Signed)
   THERAPIST PROGRESS NOTE  Session Time: 2:00-2:50 pm  Participation Level: Active  Behavioral Response: CasualConfusedAnxious and Depressed  Type of Therapy: Individual Therapy  Treatment Goals addressed: Diagnosis: sucide assessment  Interventions: Other: suicide assessment and inpatient referral  Summary: Katelyn [name] is a 37 y.o. female who presents with depressed mood and flat affect. Patient continues to remain depressed with worsening symptoms. She reports "taking an overdose" but could not remember when it was, or what or how much she took. She states that she "just woke up" and that her family does not know about the overdose. Patient struggled to identify steps needed to take to engage her crisis plan but could not contract for safety or plan for her wellness. Patient continues to binge eat and not eat food in swings. She presents in crisis an unable to think clearly and make good decisions. She went voluntarily upstairs to be assessed for inpatient admission.    Suicidal/Homicidal: Yeswithout intent/plan  Therapist Response: Assessed for safety, referred patient upstairs for inpatient admission  Plan: recommended inpatient hospitalization   Diagnosis: Axis I: Depression, Major, Recurrent, Severe, with psychosis and Anxiety NOS    Axis II: No diagnosis    ENGLEHORN,SHANNON E, LCSW 07/05/2012

## 2012-07-05 NOTE — Progress Notes (Signed)
The focus of this group is to help patients review their daily goal of treatment and discuss progress on daily workbooks. Despite having only just arrived on the unit moments prior, Pt attended the evening wrap-up group and responded to all discussion prompts with limited answers. Pt said a positive thing about her day was that she had a good workout earlier this morning. Pt did not smile and affect was flat throughout group.
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
## 2012-07-06 MED ORDER — DOCUSATE SODIUM 100 MG PO CAPS
200.0000 mg | ORAL_CAPSULE | Freq: Two times a day (BID) | ORAL | Status: DC
  Administered 2012-07-06 – 2012-07-12 (×12): 200 mg via ORAL
  Filled 2012-07-06 (×2): qty 2
  Filled 2012-07-06: qty 1
  Filled 2012-07-06 (×10): qty 2
  Filled 2012-07-06: qty 1
  Filled 2012-07-06 (×4): qty 2

## 2012-07-06 MED ORDER — FLUOXETINE HCL 20 MG PO CAPS
80.0000 mg | ORAL_CAPSULE | Freq: Every day | ORAL | Status: DC
  Administered 2012-07-07 – 2012-07-12 (×6): 80 mg via ORAL
  Filled 2012-07-06 (×8): qty 4

## 2012-07-06 MED ORDER — CLONAZEPAM 1 MG PO TABS
1.0000 mg | ORAL_TABLET | Freq: Two times a day (BID) | ORAL | Status: DC | PRN
  Administered 2012-07-06 – 2012-07-12 (×6): 1 mg via ORAL
  Filled 2012-07-06 (×6): qty 1

## 2012-07-06 MED ORDER — IBUPROFEN 200 MG PO TABS
400.0000 mg | ORAL_TABLET | Freq: Four times a day (QID) | ORAL | Status: DC | PRN
  Administered 2012-07-06 – 2012-07-09 (×3): 400 mg via ORAL
  Filled 2012-07-06 (×3): qty 2
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
## 2012-07-06 NOTE — Progress Notes (Signed)
D:Patient in the dayroom getting coffee on approach.  Patient states she is stressed because she is having problems with her best friend.  Patient states her friend is pulling away from her due to her mental illness.  Patient states she just wants her friend to return her calls.  Patient states she wants to focus on taking his medications.  Patient denies SI/HI and denies AVH. A: Staff to monitor Q 15 mins for safety.  Encouragement and support offered.  No scheduled medications administered per orders.  Klonopin administered prn for anxiety.  Trazodone administered prn for sleep.   R: Patient remains safe on the unit.  Patient attended group tonight.  Patient visible on the unit and interacting with peers.

## 2012-07-06 NOTE — Progress Notes (Signed)
Patient ID: Katelyn [name], female   DOB: 14-Apr-1976, 37 y.o.   MRN: [id number] She has been up and to groups interacting with peers and staff. Requested and received prn medication for headache today and it was effective. She put on her self inventory today that she had not given the correct story on admission and that she has a heard time trusting when taking her medication.

## 2012-07-06 NOTE — Progress Notes (Signed)
Adult Psychosocial Assessment Update Interdisciplinary Team  [facility] admissions[facility]discharges:  Admissions Discharges  Date: 05/06/12 Date:  05/14/12  Date: Date:  Date: Date:  Date: Date:  Date: Date:   Changes since the last Psychosocial Assessment (including adherence to outpatient mental health and/or substance abuse treatment, situational issues contributing to decompensation and/or relapse). Patient reports becoming increasingly depression and anxious since discharging in   January.  She shared she had not been non-compliant with anxiety and sleep   Medications due to fear of taking medicines.  She advised of reporting she had taken  Husband's Xanax to sleep but stated she took six pills (3mg ) in a suicide attempt.       Discharge Plan 1. Will you be returning to the same living situation after discharge?   Yes: No:      If no, what is your plan?    Patient will return to the home she shares with her husband at discharge.       2. Would you like a referral for services when you are discharged? Yes:     If yes, for what services?  No:       Patient plans to resume outpatient services with [facility] outpatient clinic.       Summary and Recommendations (to be completed by the evaluator) Katelyn [name] is a 37 year old Caucasian female admitted with Major Depression  Disorder.  She will Patient will benefit from crisis stabilization, evaluation for medication management, psycho education groups for coping skills development, group therapy and assistance with discharge planning.                      Signature:  Wynn Banker, 07/06/2012 11:52 AM

## 2012-07-06 NOTE — Progress Notes (Signed)
Grief and Loss Group  Group members discussed significant losses that continue to impact them. Members discussed the importance of coping with their losses by sharing and supporting with another person.   Pt was present and attentive during group. Pt offered support to another group member by relating to his feelings of pain and expectations of being completely healed upon discharge.     Sherol Dade Counselor Intern Haroldine Laws
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
## 2012-07-06 NOTE — Progress Notes (Signed)
[facility] LCSW Group Therapy  Feeling Around Diagnosis  07/06/2012 2:36 PM  Type of Therapy:  Group Therapy  Participation Level:  Active  Participation Quality:  Attentive  Affect:  Appropriate, Depressed and Flat  Cognitive:  Appropriate  Insight:  Engaged  Engagement in Therapy:  Engaged  Modes of Intervention:  Discussion, Exploration, Problem-solving, Rapport Building and Support  Summary of Progress/Problems:  Patient shared she is embarrassed by her diagnosis.  She stated it is not so bad while in the hospital but is hard for her to accept outside of the hospital.  Patient stated she would not be embarrassed by a medical diagnosis.   Wynn Banker 07/06/2012, 2:36 PM

## 2012-07-06 NOTE — Progress Notes (Signed)
[facility] LCSW Aftercare Discharge Planning Group Note  07/06/2012 11:20 AM  Participation Quality:  Appropriate  Affect:  Blunted, Depressed and Flat  Cognitive:  Appropriate  Insight:  Engaged  Engagement in Group:  Engaged  Modes of Intervention:  Exploration, Problem-solving, Rapport Building and Support  Summary of Progress/Problems: Patient reports admitting to hospital due to a increased depression and a suicide attempt.  Patient shared she took pills (husband Xanax) in a suicide attempt but advised assessment it was to sleep.  Patient endorses SI but able to contract for safety.  She rates depression at eight and all other symptoms at ten.  She is followed outpatient by [facility].  Patient shared she has worried family so much in the past that she does not want family to know she attempted suicide.    Wynn Banker 07/06/2012, 11:20 AM

## 2012-07-07 MED ORDER — TRAZODONE HCL 100 MG PO TABS
200.0000 mg | ORAL_TABLET | Freq: Every evening | ORAL | Status: DC | PRN
  Administered 2012-07-07 – 2012-07-10 (×4): 200 mg via ORAL
  Administered 2012-07-11: 100 mg via ORAL
  Filled 2012-07-07 (×5): qty 2

## 2012-07-07 MED ORDER — ZIPRASIDONE HCL 40 MG PO CAPS
40.0000 mg | ORAL_CAPSULE | Freq: Every day | ORAL | Status: DC
  Administered 2012-07-08 – 2012-07-12 (×5): 40 mg via ORAL
  Filled 2012-07-07 (×7): qty 1

## 2012-07-07 MED ORDER — METFORMIN HCL 500 MG PO TABS
500.0000 mg | ORAL_TABLET | Freq: Two times a day (BID) | ORAL | Status: DC
  Administered 2012-07-07 – 2012-07-12 (×10): 500 mg via ORAL
  Filled 2012-07-07 (×15): qty 1

## 2012-07-07 NOTE — Progress Notes (Signed)
D: Patient denies HI and A/V hallucinations and admits to on and off thoughts of SI; patient reports sleep is fair because she kept waking up; reports appetite is good ; reports energy level is low ; reports ability to pay attention is poor; rates depression as 8/10; rates hopelessness 8/10; rates anxiety as 10/10;   A: Monitored q 15 minutes; patient encouraged to attend groups; patient educated about medications; patient given medications per physician orders; patient encouraged to express feelings and/or concerns; prn medications administered as ordered  R: Patient is blunted but elaborates about status; patient is cooperative; patient's interaction with staff and peers is assertive but minimal; patient was able to set goal to talk with staff 1:1 when having feelings of SI; patient is taking medications as prescribed and tolerating medications; patient is attending all groups

## 2012-07-07 NOTE — Progress Notes (Addendum)
[facility] LCSW Aftercare Discharge Planning Group Note  07/07/2012 10:41 AM  Participation Quality:  Appropriate  Affect:  Blunted, Depressed and Flat  Cognitive:  Appropriate  Insight:  Engaged  Engagement in Group:  Engaged  Modes of Intervention:  Exploration, Problem-solving, Rapport Building and Support  Summary of Progress/Problems: Patient reports being a little better today but continues to endorse SI.  Patient contracts for safety.  She rates depression at eight and all other symptoms at ten.  She shared she did not sleep well last night.  Wynn Banker 07/07/2012, 10:41 AM

## 2012-07-07 NOTE — Progress Notes (Signed)
Adult Psychoeducational Group Note  Date:  07/07/2012 Time:  10:20 AM  Group Topic/Focus:  Therapeutic Activity- Question Newman Pies  Participation Level:  Active  Participation Quality:  Appropriate  Affect:  Appropriate  Cognitive:  Alert and Appropriate  Insight: Appropriate  Engagement in Group:  Engaged  Modes of Intervention:  Discussion  Additional Comments:  Pt was appropriate and attentive while attending group. Pt was willing to participate and answer a question on ball.   Sharyn Lull 07/07/2012, 10:20 AM

## 2012-07-07 NOTE — Progress Notes (Signed)
[facility] MD Progress Note  07/07/2012 11:40 AM Katelyn [name]  MRN:  [id number] Subjective:  Patient endorsing increased depression and anxiety. She reports being concerned about her friend wanting space and worried about her therapist. Endorsing feeling paranoid. Has been researching a plan to kill herself over the internet, states she will drink excessive amounts of water in a 2 hour period which can lead to death. Able to contract for safety in the hospital. Diagnosis:   Axis I: Major Depression, Recurrent severe Axis II: Deferred Axis III:  Past Medical History  Diagnosis Date  . Obesity   . Skin lesion     Excisional biopsy  . Depression     several suicide attempts, hospitaluzed in 2012 for this  . Dizziness - light-headed   . Polycystic ovary   . Anxiety   . Chronic headaches   . IBS (irritable bowel syndrome)   . History of pneumonia     x 3  . Benign juvenile melanoma   . Colon polyps     found on colonoscopy 04/26/2012  . Sleep apnea     has lost 100lbs.   Axis IV: other psychosocial or environmental problems Axis V: 41-50 serious symptoms  ADL's:  Intact  Sleep: Poor  Appetite:  Poor  Psychiatric Specialty Exam: Review of Systems  Constitutional: Negative.   HENT: Negative.   Eyes: Negative.   Respiratory: Negative.   Cardiovascular: Negative.   Gastrointestinal: Negative.   Musculoskeletal: Negative.   Skin: Negative.   Neurological: Negative.   Endo/Heme/Allergies: Negative.   Psychiatric/Behavioral: Positive for depression and suicidal ideas. The patient is nervous/anxious and has insomnia.     Blood pressure 140/83, pulse 80, temperature 97.1 F (36.2 C), temperature source Oral, resp. rate 16, height 6' 6.5" (1.994 m), last menstrual period 06/25/2012.There is no weight on file to calculate BMI.  General Appearance: Casual  Eye Contact::  Fair  Speech:  Slow  Volume:  Decreased  Mood:  Depressed, Dysphoric and Hopeless  Affect:  Constricted   Thought Process:  Circumstantial  Orientation:  Full (Time, Place, and Person)  Thought Content:  Rumination  Suicidal Thoughts:  Yes.  with intent/plan  Homicidal Thoughts:  No  Memory:  Immediate;   Fair Recent;   Fair Remote;   Fair  Judgement:  Impaired  Insight:  Shallow  Psychomotor Activity:  Decreased  Concentration:  Fair  Recall:  Fair  Akathisia:  No  Handed:  Right  AIMS (if indicated):     Assets:  Communication Skills Desire for Improvement Housing Social Support  Sleep:  Number of Hours: 4.75   Current Medications: Current Facility-Administered Medications  Medication Dose Route Frequency Provider Last Rate Last Dose  . alum & mag hydroxide-simeth (MAALOX/MYLANTA) 200-200-20 MG/5ML suspension 30 mL  30 mL Oral Q4H PRN Larena Sox, MD      . aspirin-acetaminophen-caffeine (EXCEDRIN MIGRAINE) per tablet 1 tablet  1 tablet Oral Q6H PRN Larena Sox, MD   1 tablet at 07/05/12 2050  . calcium carbonate (TUMS - dosed in mg elemental calcium) chewable tablet 1,000 mg  1,000 mg Oral Q6H PRN Larena Sox, MD      . clonazePAM (KLONOPIN) tablet 1 mg  1 mg Oral BID PRN Lasharon Dunivan, MD   1 mg at 07/07/12 0801  . docusate sodium (COLACE) capsule 200 mg  200 mg Oral BID Imonie Tuch, MD   200 mg at 07/07/12 0756  . FLUoxetine (PROZAC) capsule 80 mg  80  mg Oral Daily Corri Delapaz, MD   80 mg at 07/07/12 0756  . hydrOXYzine (ATARAX/VISTARIL) tablet 25 mg  25 mg Oral Q6H PRN Larena Sox, MD      . ibuprofen (ADVIL,MOTRIN) tablet 400 mg  400 mg Oral Q6H PRN Prarthana Parlin, MD   400 mg at 07/06/12 1207  . loperamide (IMODIUM) capsule 2-4 mg  2-4 mg Oral PRN Larena Sox, MD      . magnesium hydroxide (MILK OF MAGNESIA) suspension 30 mL  30 mL Oral Daily PRN Larena Sox, MD      . metFORMIN (GLUCOPHAGE) tablet 500 mg  500 mg Oral BID WC Sadik Piascik, MD      . multivitamin with minerals tablet 1 tablet  1 tablet Oral Daily Larena Sox, MD   1  tablet at 07/07/12 0756  . ondansetron (ZOFRAN-ODT) disintegrating tablet 4 mg  4 mg Oral Q6H PRN Larena Sox, MD      . thiamine (B-1) injection 100 mg  100 mg Intramuscular Once Larena Sox, MD      . thiamine (VITAMIN B-1) tablet 100 mg  100 mg Oral Daily Larena Sox, MD   100 mg at 07/07/12 0756  . traZODone (DESYREL) tablet 200 mg  200 mg Oral QHS PRN Naziyah Tieszen, MD      . Melene Muller ON 07/08/2012] ziprasidone (GEODON) capsule 40 mg  40 mg Oral Daily Maleigha Colvard, MD      . ziprasidone (GEODON) capsule 80 mg  80 mg Oral q1800 Larena Sox, MD   80 mg at 07/06/12 1815    Lab Results: No results found for this or any previous visit (from the past 48 hour(s)).  Physical Findings: AIMS: Facial and Oral Movements Muscles of Facial Expression: None, normal Lips and Perioral Area: None, normal Jaw: None, normal Tongue: None, normal,Extremity Movements Upper (arms, wrists, hands, fingers): None, normal Lower (legs, knees, ankles, toes): None, normal, Trunk Movements Neck, shoulders, hips: None, normal, Overall Severity Severity of abnormal movements (highest score from questions above): None, normal Incapacitation due to abnormal movements: None, normal Patient's awareness of abnormal movements (rate only patient's report): No Awareness, Dental Status Current problems with teeth and/or dentures?: No Does patient usually wear dentures?: No  CIWA:  CIWA-Ar Total: 0 COWS:     Treatment Plan Summary: Daily contact with patient to assess and evaluate symptoms and progress in treatment Medication management  Plan: Educated patient about depression and hopelessness being a severe symptom. Discussed ways in which she can deal with her friend wanting some space at this time. Increase Geodon to 40mg  in the morning. Increase Trazodone to 200mg  at bedtime. Patient encouraged to attend groups and discuss her feelings, learn skills to cope. Patient able to contract for safety  in the hospital.  Medical Decision Making Problem Points:  Established problem, worsening (2), Review of last therapy session (1) and Review of psycho-social stressors (1) Data Points:  Review of medication regiment & side effects (2) Review of new medications or change in dosage (2)  I certify that inpatient services furnished can reasonably be expected to improve the patient's condition.   Frankye Schwegel 07/07/2012, 11:40 AM

## 2012-07-07 NOTE — Progress Notes (Signed)
Recreation Therapy Notes  Date: 03.12.2014 Time: 3:00pm Location: Art Room      Group Topic/Focus: Personal Development & Goal Setting  Participation Level: Active  Participation Quality: Sharing  Affect: Flat  Cognitive: Appropriate  Additional Comments: Patient filled out personal Coat of Arms. Patient identified the following components of Coat of Arms: My best quality, Something I am good at, Something I value, An obstacle I have overcome, A turning point in my life, Something I want to accomplish in the next year. Patient chose to share "Something I'm good at" and "An obstacle I have overcome." Patient stated "line dancing" and "Trauma." Patient stated she wants to find ways to put herself first. Patient stated she has difficulty dealing with a lot of noise. Patient stated LCSW suggested she expose herself to music periodically. LRT suggested trying different kinds of music as well. LRT also suggested for times that the patient knows she is going to experience a lot of noise she spend 10 - 15 minutes engaged in stress relief techniques prior to exposing herself to a noisy situation. Patient spoke about using aromatherapy in the past as a way to calm herself and help with her depression. Patient stated she has asked that no family or friends call her while she is here so she can fully invest in her treatment.    Marykay Lex Blanchfield, LRT/CTRS  Jearl Klinefelter 07/07/2012 3:56 PM

## 2012-07-07 NOTE — Progress Notes (Signed)
Adult Psychoeducational Group Note  Date:  07/07/2012 Time:  11:55 AM  Group Topic/Focus:  Coping With Mental Health Crisis:   The purpose of this group is to help patients identify strategies for coping with mental health crisis.  Group discusses possible causes of crisis and ways to manage them effectively.  Participation Level:  Active  Participation Quality:  Attentive  Affect:  Blunted  Cognitive:  Oriented  Insight: Good  Engagement in Group:  Engaged  Modes of Intervention:  Discussion and Education  Additional Comments:  Pt talked about her goal and set it to focus on her self. Pt talked about her goal and got even more specific about it.  BELL, CLAIRE T 07/07/2012, 11:55 AM

## 2012-07-07 NOTE — Progress Notes (Signed)
Patient ID: Katelyn [name], female   DOB: 06-11-1975, 37 y.o.   MRN: [id number] D: Pt. Just out of the shower, reports feeling better.  Rates depression at "6" of 10, anxiety at "8" of 10. Pt. Reports had visit from mom today "that went well", but reports had a phone call though that kind of upset her. A: Writer encouraged pt. To go to group. Pt. Will be monitored q64min for safety. R: pt. Is safe on the unit. Pt. Attended group.

## 2012-07-07 NOTE — Tx Team (Addendum)
Interdisciplinary Treatment Plan Update   Date Reviewed:  07/07/2012  Time Reviewed:  10:42 AM  Progress in Treatment:   Attending groups: Yes Participating in groups: Yes Taking medication as prescribed: Yes  Tolerating medication: Yes Family/Significant other contact made: No, contact to be made with husband Patient understands diagnosis: Yes  Discussing patient identified problems/goals with staff: Yes Medical problems stabilized or resolved: Yes Denies suicidal/homicidal ideation: Yes Patient has not harmed self or others: Yes  For review of initial/current patient goals, please see plan of care.  Estimated Length of Stay:  2-3 days  Reasons for Continued Hospitalization:  Anxiety Depression Medication stabilization Suicidal ideation  New Problems/Goals identified:    Discharge Plan or Barriers:   Home with outpatient follow up  Additional Comments:  Patient continues to endorse SI but contracts for safety.  She rates depression at eight and all other symptoms at ten.  MD to increase Geodon to 40 mg daily.  Patient shared with MD she researched last weekend how to commit suicide by drinking a lot of water.  Attendees:  Patient: Katelyn [name] 07/07/2012 10:42 AM   Signature: Patrick North, MD 07/07/2012 10:42 AM  Signature: 07/07/2012 10:42 AM  Signature: Harold Barban, RN 07/07/2012 10:42 AM  Signature: 07/07/2012 10:42 AM  Signature:  07/07/2012 10:42 AM  Signature:  Juline Patch, LCSW 07/07/2012 10:42 AM  Signature: Silverio Decamp, PMH-NP 07/07/2012 10:42 AM  Signature:  07/07/2012 10:42 AM  Signature: Fransisca Kaufmann, [facility] 07/07/2012 10:42 AM  Signature:    Signature:    Signature:      Scribe for Treatment Team:   Juline Patch,  07/07/2012 10:42 AM

## 2012-07-07 NOTE — Progress Notes (Signed)
[facility] LCSW Group Therapy  Emotional Regulation  07/07/2012 2:44 PM  Type of Therapy:  Group Therapy  Participation Level:  Active  Participation Quality:  Attentive  Affect:  Appropriate, Depressed and Flat  Cognitive:  Appropriate  Insight:  Engaged  Engagement in Therapy:  Engaged  Modes of Intervention:  Discussion, Exploration, Problem-solving, Rapport Building and Support  Summary of Progress/Problems:  Patient has to learn how to be patient in order to keep her frustration under control.  Patient stated it difficult for her to deal with the noise her young children make when they get home from school.  She shared she keeps the house quiet all day and when they get home she becomes anxious and frustrated and wants to go to her home.  Patient was encouraged to add a little sound to her environment during the day so that in time, the noise from the children will not be as overwhelming.  Patient shared she had not thought of doing something like that to make things better.   Wynn Banker 07/07/2012, 2:44 PM

## 2012-07-08 NOTE — Progress Notes (Signed)
[facility] Group Notes:  (Nursing/MHT/Case Management/Adjunct)  Date:  07/08/2012  Time:  10:52 AM  Type of Therapy:  Coping Skills- Pictionary  Participation Level:  Active  Participation Quality:  Appropriate, Attentive and Sharing  Affect:  Appropriate  Cognitive:  Appropriate  Insight:  Appropriate  Engagement in Group:  Developing/Improving  Modes of Intervention:  Activity and Pictionary  Summary of Progress/Problems: Patient attended group and participated in the therapeutic activity of pictionary. Patient was active, and appropriate during group.  Karleen Hampshire Brittini 07/08/2012, 10:52 AM

## 2012-07-08 NOTE — Progress Notes (Signed)
Adult Psychoeducational Group Note  Date:  07/08/2012 Time:  2015  Group Topic/Focus:  Wrap-Up Group:   The focus of this group is to help patients review their daily goal of treatment and discuss progress on daily workbooks.  Participation Level:  Active  Participation Quality:  Sharing and Supportive  Affect:  Blunted and Flat  Cognitive:  Appropriate  Insight: Good  Engagement in Group:  Engaged  Modes of Intervention:  Exploration and Support  Additional Comments:    Humberto Seals Monique 07/08/2012, 9:28 PM

## 2012-07-08 NOTE — Progress Notes (Signed)
[facility] INPATIENT:  Family/Significant Other Suicide Prevention Education  Suicide Prevention Education:  Education Completed; Angelene Giovanni, Husband, [phone] has been identified by the patient as the family member/significant other with whom the patient will be residing, and identified as the person(s) who will aid the patient in the event of a mental health crisis (suicidal ideations/suicide attempt).  With written consent from the patient, the family member/significant other has been provided the following suicide prevention education, prior to the and/or following the discharge of the patient.  The suicide prevention education provided includes the following:  Suicide risk factors  Suicide prevention and interventions  National Suicide Hotline telephone number  [facility] assessment telephone number  [facility] Emergency Assistance 911  [facility] and/or Residential Mobile Crisis Unit telephone number  Request made of family/significant other to:  Remove weapons (e.g., guns, rifles, knives), all items previously/currently identified as safety concern.  Husband reports there are no guns in the home.  Remove drugs/medications (over-the-counter, prescriptions, illicit drugs), all items previously/currently identified as a safety concern.  The family member/significant other verbalizes understanding of the suicide prevention education information provided.  The family member/significant other agrees to remove the items of safety concern listed above.   Husband reports he is familiar with suicide prevention as it has been reviewed multiple times.  Wynn Banker 07/08/2012, 2:50 PM

## 2012-07-08 NOTE — Progress Notes (Signed)
[facility] LCSW Group Therapy  07/08/2012 2:53 PM  Type of Therapy:  Group Therapy  Participation Level:  Active  Participation Quality:  Appropriate  Affect:  Appropriate  Cognitive:  Appropriate  Insight:  Engaged  Engagement in Therapy:  Engaged  Modes of Intervention:  Discussion, Education, Exploration, Problem-solving, Rapport Building and Support  Summary of Progress/Problems: Patient listened attentively to speaker from [employer].  She shared she is interested in the program and wants to attend [facility] groups rather than going to [facility] again.   Wynn Banker 07/08/2012, 2:53 PM

## 2012-07-08 NOTE — Progress Notes (Signed)
[facility] LCSW Aftercare Discharge Planning Group Note  07/08/2012 10:33 AM  Participation Quality:  Appropriate  Affect:  Blunted, Depressed and Flat  Cognitive:  Appropriate  Insight:  Engaged  Engagement in Group:  Engaged  Modes of Intervention:  Exploration, Problem-solving, Rapport Building and Support  Summary of Progress/Problems: Patient reports being better today.  She denies SI/HI and rates depression at five and anxiety at seven.  Patient shared it has been good to be away from the stressors that lead to her admission.  She continues to be concerned about the relationship with her friend and her therapist.  Writer to contact therapist to stop by to see patient.   Wynn Banker 07/08/2012, 10:33 AM

## 2012-07-08 NOTE — Progress Notes (Signed)
Patient ID: Katelyn [name], female   DOB: [birth year], 37 y.o.   MRN: [id number] D: Patient in dayroom on approach. Pt presented with depressed mood and flat affect. Pt stated "disappointed for coming back to hospital again". Pt anxious about children visiting tomorrow. Pt denies SI/HI/AVH and pain. Pt attended evening wrap up group and Interacted appropriately with peers. Cooperative with assessment. No acute distressed noted at this time.   A: Met with pt 1:1. Medications administered as prescribed. Writer encouraged pt to discuss feelings. Pt encouraged to come to staff with any question or concerns.  R: Patient remains safe. She is complaint with medications and group programming. Continue current POC.

## 2012-07-08 NOTE — Progress Notes (Signed)
Adult Psychoeducational Group Note  Date:  07/08/2012 Time:  1100  Group Topic/Focus:  Building Self Esteem:   The Focus of this group is helping patients become aware of the effects of self-esteem on their lives, the things they and others do that enhance or undermine their self-esteem, seeing the relationship between their level of self-esteem and the choices they make and learning ways to enhance self-esteem.  Participation Level:  Active  Participation Quality:  Appropriate and Attentive  Affect:  Appropriate  Cognitive:  Alert, Appropriate and Oriented  Insight: Appropriate  Engagement in Group:  Engaged  Modes of Intervention:  Discussion and Education  Additional Comments:    Noah Charon 07/08/2012, 12:22 PM

## 2012-07-08 NOTE — Progress Notes (Signed)
[facility] MD Progress Note  07/08/2012 9:28 AM Katelyn [name]  MRN:  [id number] Subjective: Patient tolerating her medications well. Slept better last night. Continues to ruminate on friendship, reports feeling very overwhelmed with life. Continues to be very anxious.  Diagnosis:   Axis I: Major Depression, Recurrent severe Axis II: Deferred Axis III:  Past Medical History  Diagnosis Date  . Obesity   . Skin lesion     Excisional biopsy  . Depression     several suicide attempts, hospitaluzed in 2012 for this  . Dizziness - light-headed   . Polycystic ovary   . Anxiety   . Chronic headaches   . IBS (irritable bowel syndrome)   . History of pneumonia     x 3  . Benign juvenile melanoma   . Colon polyps     found on colonoscopy 04/26/2012  . Sleep apnea     has lost 100lbs.   Axis IV: other psychosocial or environmental problems Axis V: 41-50 serious symptoms  ADL's:  Intact  Sleep: Fair  Appetite:  Fair    Psychiatric Specialty Exam: Review of Systems  Constitutional: Negative.   HENT: Negative.   Eyes: Negative.   Respiratory: Negative.   Cardiovascular: Negative.   Gastrointestinal: Negative.   Genitourinary: Negative.   Musculoskeletal: Negative.   Skin: Negative.   Neurological: Negative.   Endo/Heme/Allergies: Negative.   Psychiatric/Behavioral: Positive for depression and suicidal ideas. The patient is nervous/anxious.     Blood pressure 136/88, pulse 83, temperature 97.3 F (36.3 C), temperature source Oral, resp. rate 18, height 6' 6.5" (1.994 m), last menstrual period 06/25/2012.There is no weight on file to calculate BMI.  General Appearance: Casual  Eye Contact::  Fair  Speech:  Clear and Coherent  Volume:  Decreased  Mood:  Anxious, Depressed and Hopeless  Affect:  Constricted and Depressed  Thought Process:  Coherent  Orientation:  Full (Time, Place, and Person)  Thought Content:  Rumination  Suicidal Thoughts:  Yes.  with intent/plan   Homicidal Thoughts:  No  Memory:  Immediate;   Fair Recent;   Fair Remote;   Fair  Judgement:  Fair  Insight:  Present  Psychomotor Activity:  Normal  Concentration:  Fair  Recall:  Fair  Akathisia:  No  Handed:  Right  AIMS (if indicated):     Assets:  Communication Skills Desire for Improvement Housing Social Support  Sleep:  Number of Hours: 5.5   Current Medications: Current Facility-Administered Medications  Medication Dose Route Frequency Katelyn [name] Last Rate Last Dose  . alum & mag hydroxide-simeth (MAALOX/MYLANTA) 200-200-20 MG/5ML suspension 30 mL  30 mL Oral Q4H PRN Larena Sox, MD      . aspirin-acetaminophen-caffeine (EXCEDRIN MIGRAINE) per tablet 1 tablet  1 tablet Oral Q6H PRN Larena Sox, MD   1 tablet at 07/05/12 2050  . calcium carbonate (TUMS - dosed in mg elemental calcium) chewable tablet 1,000 mg  1,000 mg Oral Q6H PRN Larena Sox, MD      . clonazePAM (KLONOPIN) tablet 1 mg  1 mg Oral BID PRN Himabindu Ravi, MD   1 mg at 07/08/12 0837  . docusate sodium (COLACE) capsule 200 mg  200 mg Oral BID Himabindu Ravi, MD   200 mg at 07/07/12 1659  . FLUoxetine (PROZAC) capsule 80 mg  80 mg Oral Daily Himabindu Ravi, MD   80 mg at 07/07/12 0756  . hydrOXYzine (ATARAX/VISTARIL) tablet 25 mg  25 mg Oral Q6H PRN Iven Finn  Puthuvel, MD      . ibuprofen (ADVIL,MOTRIN) tablet 400 mg  400 mg Oral Q6H PRN Himabindu Ravi, MD   400 mg at 07/06/12 1207  . loperamide (IMODIUM) capsule 2-4 mg  2-4 mg Oral PRN Larena Sox, MD      . magnesium hydroxide (MILK OF MAGNESIA) suspension 30 mL  30 mL Oral Daily PRN Larena Sox, MD      . metFORMIN (GLUCOPHAGE) tablet 500 mg  500 mg Oral BID WC Himabindu Ravi, MD   500 mg at 07/07/12 1700  . multivitamin with minerals tablet 1 tablet  1 tablet Oral Daily Larena Sox, MD   1 tablet at 07/07/12 0756  . ondansetron (ZOFRAN-ODT) disintegrating tablet 4 mg  4 mg Oral Q6H PRN Larena Sox, MD      . thiamine (B-1)  injection 100 mg  100 mg Intramuscular Once Larena Sox, MD      . thiamine (VITAMIN B-1) tablet 100 mg  100 mg Oral Daily Larena Sox, MD   100 mg at 07/07/12 0756  . traZODone (DESYREL) tablet 200 mg  200 mg Oral QHS PRN Himabindu Ravi, MD   200 mg at 07/07/12 2138  . ziprasidone (GEODON) capsule 40 mg  40 mg Oral Daily Himabindu Ravi, MD      . ziprasidone (GEODON) capsule 80 mg  80 mg Oral q1800 Larena Sox, MD   80 mg at 07/07/12 1806    Lab Results: No results found for this or any previous visit (from the past 48 hour(s)).  Physical Findings: AIMS: Facial and Oral Movements Muscles of Facial Expression: None, normal Lips and Perioral Area: None, normal Jaw: None, normal Tongue: None, normal,Extremity Movements Upper (arms, wrists, hands, fingers): None, normal Lower (legs, knees, ankles, toes): None, normal, Trunk Movements Neck, shoulders, hips: None, normal, Overall Severity Severity of abnormal movements (highest score from questions above): None, normal Incapacitation due to abnormal movements: None, normal Patient's awareness of abnormal movements (rate only patient's report): No Awareness, Dental Status Current problems with teeth and/or dentures?: No Does patient usually wear dentures?: No  CIWA:  CIWA-Ar Total: 0 COWS:     Treatment Plan Summary: Daily contact with patient to assess and evaluate symptoms and progress in treatment Medication management  Plan: Continue current plan of care. Discussed coping strategies with patient on handling her anxiety. Continue to monitor mood.  Medical Decision Making Problem Points:  Established problem, stable/improving (1), Review of last therapy session (1) and Review of psycho-social stressors (1) Data Points:  Review of medication regiment & side effects (2) Review of new medications or change in dosage (2)  I certify that inpatient services furnished can reasonably be expected to improve the patient's  condition.   RAVI, HIMABINDU 07/08/2012, 9:28 AM

## 2012-07-09 MED ORDER — METHYLPHENIDATE HCL 10 MG PO TABS
5.0000 mg | ORAL_TABLET | Freq: Every day | ORAL | Status: DC
  Administered 2012-07-09: 16:00:00 via ORAL
  Administered 2012-07-10: 5 mg via ORAL
  Filled 2012-07-09 (×2): qty 1

## 2012-07-09 NOTE — Progress Notes (Signed)
Adult Psychoeducational Group Note  Date:  07/09/2012 Time:  11:00am Group Topic/Focus:  Relapse Prevention Planning:   The focus of this group is to define relapse and discuss the need for planning to combat relapse.  Participation Level:  Active  Participation Quality:  Appropriate, Sharing and Supportive  Affect:  Appropriate  Cognitive:  Alert and Appropriate  Insight: Appropriate  Engagement in Group:  Engaged  Modes of Intervention:  Discussion  Additional Comments: Pt was attentive and appropriate during group discussion. Pt. Was able to share with peers about why she was admitted and what changes she will make. Pt. Was able to share about her past and how she is going to make changes.   Katelyn [name] D 07/09/2012, 11:41 AM

## 2012-07-09 NOTE — Progress Notes (Signed)
[facility] LCSW Group Therapy  Relapse Prevention 1:15 - 2:30 PM  07/09/2012 2:30 PM  Type of Therapy:  Group Therapy  Participation Level:  Active  Participation Quality:  Appropriate  Affect:  Depressed. Flat  Cognitive:  Appropriate  Insight:  Engaged  Engagement in Therapy:  Engaged  Modes of Intervention:  Discussion, Education, Exploration, Problem-solving, Rapport Building and Support  Summary of Progress/Problems: Patient advised the behaviors she needs to keep from returning to is not eating and not taking her medications.  Patient shared she needs to eat before food helps to medication to work better. She shared when her meds are working she is less paranoid and does much better in all areas.    Patient shared food seems to be the only thing in her life she can contro.   Wynn Banker 07/09/2012, 2:30 PM

## 2012-07-09 NOTE — Progress Notes (Signed)
[facility] MD Progress Note  07/09/2012 2:39 PM Katelyn [name]  MRN:  [id number] Subjective:  Patient endorsing high anxiety and feeling more depressed. She heard her outpatient physician may not continue to see her and this is upsetting her greatly. Tolerating her medications well, the increase in geodone has helped with the paranoia. Denies any suicidal thoughts today.  Diagnosis:   Axis I: Major Depression, Recurrent severe Axis II: Deferred Axis III:  Past Medical History  Diagnosis Date  . Obesity   . Skin lesion     Excisional biopsy  . Depression     several suicide attempts, hospitaluzed in 2012 for this  . Dizziness - light-headed   . Polycystic ovary   . Anxiety   . Chronic headaches   . IBS (irritable bowel syndrome)   . History of pneumonia     x 3  . Benign juvenile melanoma   . Colon polyps     found on colonoscopy 04/26/2012  . Sleep apnea     has lost 100lbs.   Axis IV: other psychosocial or environmental problems Axis V: 41-50 serious symptoms  ADL's:  Intact  Sleep: Fair  Appetite:  Fair    Psychiatric Specialty Exam: Review of Systems  Constitutional: Negative.   HENT: Negative.   Eyes: Negative.   Respiratory: Negative.   Cardiovascular: Negative.   Gastrointestinal: Negative.   Genitourinary: Negative.   Musculoskeletal: Negative.   Skin: Negative.   Neurological: Negative.   Endo/Heme/Allergies: Negative.   Psychiatric/Behavioral: Positive for depression. The patient is nervous/anxious and has insomnia.     Blood pressure 122/75, pulse 88, temperature 97.6 F (36.4 C), temperature source Oral, resp. rate 16, height 6' 6.5" (1.994 m), last menstrual period 06/25/2012.There is no weight on file to calculate BMI.  General Appearance: Casual  Eye Contact::  Fair  Speech:  Slow  Volume:  Normal  Mood:  Anxious, Depressed and Hopeless  Affect:  Constricted and Depressed  Thought Process:  Circumstantial  Orientation:  Full (Time, Place, and  Person)  Thought Content:  Rumination  Suicidal Thoughts:  No  Homicidal Thoughts:  No  Memory:  Immediate;   Fair Recent;   Fair Remote;   Fair  Judgement:  Fair  Insight:  Present  Psychomotor Activity:  Normal  Concentration:  Fair  Recall:  Fair  Akathisia:  No  Handed:  Right  AIMS (if indicated):     Assets:  Communication Skills Desire for Improvement Housing Social Support  Sleep:  Number of Hours: 6   Current Medications: Current Facility-Administered Medications  Medication Dose Route Frequency Provider Last Rate Last Dose  . alum & mag hydroxide-simeth (MAALOX/MYLANTA) 200-200-20 MG/5ML suspension 30 mL  30 mL Oral Q4H PRN Larena Sox, MD      . aspirin-acetaminophen-caffeine (EXCEDRIN MIGRAINE) per tablet 1 tablet  1 tablet Oral Q6H PRN Larena Sox, MD   1 tablet at 07/05/12 2050  . calcium carbonate (TUMS - dosed in mg elemental calcium) chewable tablet 1,000 mg  1,000 mg Oral Q6H PRN Larena Sox, MD      . clonazePAM (KLONOPIN) tablet 1 mg  1 mg Oral BID PRN Himabindu Ravi, MD   1 mg at 07/09/12 0746  . docusate sodium (COLACE) capsule 200 mg  200 mg Oral BID Himabindu Ravi, MD   200 mg at 07/09/12 0744  . FLUoxetine (PROZAC) capsule 80 mg  80 mg Oral Daily Himabindu Ravi, MD   80 mg at 07/09/12 0745  .  ibuprofen (ADVIL,MOTRIN) tablet 400 mg  400 mg Oral Q6H PRN Himabindu Ravi, MD   400 mg at 07/08/12 1549  . magnesium hydroxide (MILK OF MAGNESIA) suspension 30 mL  30 mL Oral Daily PRN Larena Sox, MD      . metFORMIN (GLUCOPHAGE) tablet 500 mg  500 mg Oral BID WC Himabindu Ravi, MD   500 mg at 07/09/12 0743  . multivitamin with minerals tablet 1 tablet  1 tablet Oral Daily Larena Sox, MD   1 tablet at 07/09/12 0744  . thiamine (B-1) injection 100 mg  100 mg Intramuscular Once Larena Sox, MD      . thiamine (VITAMIN B-1) tablet 100 mg  100 mg Oral Daily Larena Sox, MD   100 mg at 07/09/12 0743  . traZODone (DESYREL) tablet 200 mg   200 mg Oral QHS PRN Himabindu Ravi, MD   200 mg at 07/08/12 2135  . ziprasidone (GEODON) capsule 40 mg  40 mg Oral Daily Himabindu Ravi, MD   40 mg at 07/09/12 0743  . ziprasidone (GEODON) capsule 80 mg  80 mg Oral q1800 Larena Sox, MD   80 mg at 07/08/12 1810    Lab Results: No results found for this or any previous visit (from the past 48 hour(s)).  Physical Findings: AIMS: Facial and Oral Movements Muscles of Facial Expression: None, normal Lips and Perioral Area: None, normal Jaw: None, normal Tongue: None, normal,Extremity Movements Upper (arms, wrists, hands, fingers): None, normal Lower (legs, knees, ankles, toes): None, normal, Trunk Movements Neck, shoulders, hips: None, normal, Overall Severity Severity of abnormal movements (highest score from questions above): None, normal Incapacitation due to abnormal movements: None, normal Patient's awareness of abnormal movements (rate only patient's report): No Awareness, Dental Status Current problems with teeth and/or dentures?: No Does patient usually wear dentures?: No  CIWA:  CIWA-Ar Total: 2 COWS:     Treatment Plan Summary: Daily contact with patient to assess and evaluate symptoms and progress in treatment Medication management  Plan: Continue current plan of care. Start ritalin at 5mg  po qam to augment therapy for depression. Encouraged patient to not dwell on her physician change and focus on getting better.   Medical Decision Making Problem Points:  Established problem, stable/improving (1), Review of last therapy session (1) and Review of psycho-social stressors (1) Data Points:  Review of medication regiment & side effects (2) Review of new medications or change in dosage (2)  I certify that inpatient services furnished can reasonably be expected to improve the patient's condition.   RAVI, HIMABINDU 07/09/2012, 2:39 PM

## 2012-07-09 NOTE — Progress Notes (Signed)
Recreation Therapy Notes  Date: 03.14.2014 Time: 3:00pm Location: [facility]      Group Topic/Focus: Communication  Participation Level: Active  Participation Quality: Appropriate  Affect: Flat  Cognitive: Appropriate    Additional Comments: Patient played two rounds of the "Telephone" game. Patient with peers were unsuccessful at getting a sentence around the group intact. Patient played "What is Different" a game that requires one member of the group to stand in the center of the group, be observed for 5 seconds and then leave the group and change something about their appearance.  Patient peers then guess what has changed about the patient. Patient participated in guessing what had changed about peers. Patient changed the following things about her appearance for peers to guess: put hair behind her ears and took her hands out of her pockets. Patient stated relapse prevention to her is "staying healthy." Patient stated that good communication skills can benefit her life and relapse prevention in the following way: be present in the moment.   Marykay Lex Blanchfield, LRT/CTRS   Jearl Klinefelter 07/09/2012 4:01 PM

## 2012-07-09 NOTE — Progress Notes (Signed)
Patient ID: Katelyn [name], female   DOB: 1976/01/01, 37 y.o.   MRN: [id number] D: Patient in dayroom on approach. Pt presented with depressed mood and flat affect. Pt stated she had increased depression during the day and anxious about family visiting. Pt had visit from husband and children this evening. Pt was sad when family left because her 37 year old was crying and wanted to stay with her.  Pt denies SI/HI/AVH. Pt attended evening wrap up group and Interacted appropriately with peers. Cooperative with assessment. No acute distressed noted at this time.   A: Met with pt 1:1. Medications administered as prescribed. Writer encouraged pt to discuss feelings. Pt encouraged to come to staff with any question or concerns.  R: Patient remains safe. She is complaint with medications and group programming. Continue current POC.

## 2012-07-09 NOTE — Tx Team (Signed)
Interdisciplinary Treatment Plan Update   Date Reviewed:  07/09/2012  Time Reviewed:  9:48 AM  Progress in Treatment:   Attending groups: Yes Participating in groups: Yes Taking medication as prescribed: Yes  Tolerating medication: Yes Family/Significant other contact made:  Yes, contact made with husband Patient understands diagnosis: Yes  Discussing patient identified problems/goals with staff: Yes Medical problems stabilized or resolved: Yes Denies suicidal/homicidal ideation: Yes Patient has not harmed self or others: Yes  For review of initial/current patient goals, please see plan of care.  Estimated Length of Stay:  2-3 days  Reasons for Continued Hospitalization:  Anxiety Depression Medication stabilization Suicidal ideation  New Problems/Goals identified:    Discharge Plan or Barriers:   Home with outpatient follow up with [facility]  Additional Comments:  Patient continues to endorse SI but contracts for safety.  She advised outpatient MD wants to to have ECT but she is not certain she wants to follow through with that recommendation.    Attendees:  Patient: 07/09/2012 9:48 AM   Signature: Patrick North, MD 07/09/2012 9:48 AM  Signature:  Berneice Heinrich, Rn 07/09/2012 9:48 AM  Signature: Harold Barban, RN 07/09/2012 9:48 AM  Signature: 07/09/2012 9:48 AM  Signature:  07/09/2012 9:48 AM  Signature:  Juline Patch, LCSW 07/09/2012 9:48 AM  Signature: Silverio Decamp, PMH-NP 07/09/2012 9:48 AM  Signature:  07/09/2012 9:48 AM  Signature: Fransisca Kaufmann, [facility] 07/09/2012 9:48 AM  Signature:    Signature:    Signature:      Scribe for Treatment Team:   Juline Patch,  07/09/2012 9:48 AM

## 2012-07-09 NOTE — Progress Notes (Signed)
D: Patient appropriate and cooperative with staff and peers. Patient's affect/mood is sad and depressed. She reported on the self inventory sheet that her sleep is poor, appetite is good, energy level is low and ability to pay attention is improving. Patient rated depression "7" and feelings of hopelessness "6". She verbalized that she's very nervous today because her children will be visiting with her at dinner. Also, she stated that she's "anxious about having to make a decision about whether to have ECT done again or not". Patient verbalized that her therapist is advising that she has this procedure performed because it was effective in the past.   A: Support and encouragement provided to patient. Scheduled medications administered per MD orders. Maintain Q15 minute checks for safety.  R: Patient receptive. Denies SI/HI/AVH at this time. Patient remains safe.

## 2012-07-10 ENCOUNTER — Encounter (HOSPITAL_COMMUNITY): Payer: Self-pay | Admitting: Registered Nurse

## 2012-07-10 DIAGNOSIS — F333 Major depressive disorder, recurrent, severe with psychotic symptoms: Principal | ICD-10-CM

## 2012-07-10 MED ORDER — METHYLPHENIDATE HCL 10 MG PO TABS
5.0000 mg | ORAL_TABLET | Freq: Every day | ORAL | Status: DC
  Administered 2012-07-11 – 2012-07-12 (×2): 5 mg via ORAL
  Filled 2012-07-10 (×2): qty 1

## 2012-07-10 NOTE — Progress Notes (Signed)
Katelyn [name] is seen out in the milieu on the [address] hall today....she is quiet, but is seen interacting with her peers. She completed her self inventory and on it she  Wrote she denied SI within the past 24 hrs, she rated her depression and hopelessness " 7 / 5 " and stated her DC plan is to " take my meds".   A She has met with her physician and ritalin is dc'Katelyn at this time.   R Safety is in place and POC cont with theraeutic relationship fostered.

## 2012-07-10 NOTE — Clinical Social Work Note (Signed)
[facility] Group Notes:  (Clinical Social Work)  07/10/2012   3:00-4:00PM  Summary of Progress/Problems:   The main focus of today's process group was for the patient to identify something in their life that led to their hospitalization that they would like to change, then to discuss their motivation to change.  The Stages of Change were explained to the group, then each patient identified where they are in that process.  .  The patient expressed that she needs and wants to be more independent, that she relies on others too much to help with her anxiety.  She discussed a specific plan for returning to a routine of exercise, only this time on her own.  She is in the preparation stage.  Type of Therapy:  Process Group  Participation Level:  Active  Participation Quality:  Appropriate, Attentive and Sharing  Affect:  Blunted and Depressed  Cognitive:  Alert, Appropriate and Oriented  Insight:  Engaged  Engagement in Therapy:  Engaged  Modes of Intervention:  Clarification, Support and Processing, Exploration, Discussion   Ambrose Mantle, LCSW 07/10/2012, 4:14 PM

## 2012-07-10 NOTE — Progress Notes (Signed)
[facility] Group Notes:  (Nursing/MHT/Case Management/Adjunct)  Date:  07/10/2012 Time:  2000  Type of Therapy:  Psychoeducational Skills  Participation Level:  None  Participation Quality:  Resistant  Affect:  Flat  Cognitive:  Lacking  Insight:  Lacking  Engagement in Group:  Lacking  Modes of Intervention:  Education  Summary of Progress/Problems: The patient arrived late for group and had nothing to share.   Katelyn [name] 07/10/2012, 10:38 PM

## 2012-07-10 NOTE — Progress Notes (Signed)
Met with pt 1:1 who remains flat, quiet. States depression is lessening somewhat. Affect remains quite flat, blank. Pt pleased that MD reinstated order for ritalin. Feels it has helped the past 2 mornings. Requested info on med. Patient information printed and given. Support, reassurance provided. She denies SI/HI/AVH and remains safe on unit. Katelyn [name]

## 2012-07-10 NOTE — Progress Notes (Addendum)
Patient ID: Katelyn [name], female   DOB: [birth year], 37 y.o.   MRN: [id number] [facility] MD Progress Note  07/10/2012 11:11 AM Katelyn [name]  MRN:  [id number] Subjective:  Patient continues to endorse high anxiety and feelings of depressed but improving. Patient states that starting the has helped.   Patient also states that she is getting more coping skills that will help such as listening to TV or playing music to desensitize not being able to be around noise which will help with her children when they come home. Diagnosis:   Axis I: Major Depression, Recurrent severe Axis II: Deferred Axis III:  Past Medical History  Diagnosis Date  . Obesity   . Skin lesion     Excisional biopsy  . Depression     several suicide attempts, hospitaluzed in 2012 for this  . Dizziness - light-headed   . Polycystic ovary   . Anxiety   . Chronic headaches   . IBS (irritable bowel syndrome)   . History of pneumonia     x 3  . Benign juvenile melanoma   . Colon polyps     found on colonoscopy 04/26/2012  . Sleep apnea     has lost 100lbs.   Axis IV: other psychosocial or environmental problems Axis V: 41-50 serious symptoms  ADL's:  Intact  Sleep: Fair  Appetite:  Fair    Psychiatric Specialty Exam: Review of Systems  Psychiatric/Behavioral: Positive for depression, suicidal ideas, hallucinations and memory loss. Negative for substance abuse. The patient is nervous/anxious and has insomnia.        Patient states that she is improving.  States that she is not hearing voices at this time states that the Geodon really helps.  States that she continues to have problems with noises.  Patient states that her anxiety and depressions is improving Rates anxiety 6/10 and depression 7/10.    Blood pressure 108/73, pulse 80, temperature 98.2 F (36.8 C), temperature source Oral, resp. rate 21, height 6' 6.5" (1.994 m), last menstrual period 06/25/2012.There is no weight on file to calculate BMI.  General  Appearance: Casual  Eye Contact::  Fair  Speech:  Slow  Volume:  Normal  Mood:  Anxious and Depressed  Affect:  Depressed  Thought Process:  Circumstantial and Goal Directed  Orientation:  Full (Time, Place, and Person)  Thought Content:  Rumination  Suicidal Thoughts:  No  Homicidal Thoughts:  No  Memory:  Immediate;   Fair Recent;   Fair Remote;   Fair  Judgement:  Fair  Insight:  Present  Psychomotor Activity:  Normal  Concentration:  Fair  Recall:  Fair  Akathisia:  No  Handed:  Right  AIMS (if indicated):     Assets:  Communication Skills Desire for Improvement Housing Social Support  Sleep:  Number of Hours: 6.75   Current Medications: Current Facility-Administered Medications  Medication Dose Route Frequency Provider Last Rate Last Dose  . alum & mag hydroxide-simeth (MAALOX/MYLANTA) 200-200-20 MG/5ML suspension 30 mL  30 mL Oral Q4H PRN Larena Sox, MD      . aspirin-acetaminophen-caffeine (EXCEDRIN MIGRAINE) per tablet 1 tablet  1 tablet Oral Q6H PRN Larena Sox, MD   1 tablet at 07/05/12 2050  . calcium carbonate (TUMS - dosed in mg elemental calcium) chewable tablet 1,000 mg  1,000 mg Oral Q6H PRN Larena Sox, MD      . clonazePAM (KLONOPIN) tablet 1 mg  1 mg Oral BID PRN Himabindu Ravi,  MD   1 mg at 07/09/12 0746  . docusate sodium (COLACE) capsule 200 mg  200 mg Oral BID Himabindu Ravi, MD   200 mg at 07/10/12 0826  . FLUoxetine (PROZAC) capsule 80 mg  80 mg Oral Daily Himabindu Ravi, MD   80 mg at 07/10/12 0827  . ibuprofen (ADVIL,MOTRIN) tablet 400 mg  400 mg Oral Q6H PRN Himabindu Ravi, MD   400 mg at 07/09/12 1958  . magnesium hydroxide (MILK OF MAGNESIA) suspension 30 mL  30 mL Oral Daily PRN Larena Sox, MD      . metFORMIN (GLUCOPHAGE) tablet 500 mg  500 mg Oral BID WC Himabindu Ravi, MD   500 mg at 07/10/12 0827  . methylphenidate (RITALIN) tablet 5 mg  5 mg Oral Daily Himabindu Ravi, MD   5 mg at 07/10/12 1610  . multivitamin with  minerals tablet 1 tablet  1 tablet Oral Daily Larena Sox, MD   1 tablet at 07/10/12 0827  . thiamine (B-1) injection 100 mg  100 mg Intramuscular Once Larena Sox, MD      . thiamine (VITAMIN B-1) tablet 100 mg  100 mg Oral Daily Larena Sox, MD   100 mg at 07/10/12 0827  . traZODone (DESYREL) tablet 200 mg  200 mg Oral QHS PRN Himabindu Ravi, MD   200 mg at 07/09/12 2154  . ziprasidone (GEODON) capsule 40 mg  40 mg Oral Daily Himabindu Ravi, MD   40 mg at 07/10/12 0827  . ziprasidone (GEODON) capsule 80 mg  80 mg Oral q1800 Larena Sox, MD   80 mg at 07/09/12 1806    Lab Results: No results found for this or any previous visit (from the past 48 hour(s)).  Physical Findings: AIMS: Facial and Oral Movements Muscles of Facial Expression: None, normal Lips and Perioral Area: None, normal Jaw: None, normal Tongue: None, normal,Extremity Movements Upper (arms, wrists, hands, fingers): None, normal Lower (legs, knees, ankles, toes): None, normal, Trunk Movements Neck, shoulders, hips: None, normal, Overall Severity Severity of abnormal movements (highest score from questions above): None, normal Incapacitation due to abnormal movements: None, normal Patient's awareness of abnormal movements (rate only patient's report): No Awareness, Dental Status Current problems with teeth and/or dentures?: No Does patient usually wear dentures?: No  CIWA:  CIWA-Ar Total: 0 COWS:     Treatment Plan Summary: Daily contact with patient to assess and evaluate symptoms and progress in treatment Medication management Will continue current medications.  Plan: Continue current plan of care. Encouraged patient to not dwell on her physician change and focus on getting better.  Will continue current treatment plan.  Medical Decision Making Problem Points:  Established problem, stable/improving (1), Review of last therapy session (1) and Review of psycho-social stressors (1) Data Points:   Review or order clinical lab tests (1) Review and summation of old records (2) Review of medication regiment & side effects (2) Review of new medications or change in dosage (2)  I certify that inpatient services furnished can reasonably be expected to improve the patient's condition.   Rankin, Shuvon 07/10/2012, 11:11 AM  Reviewed patient's chart, discussed with provider, and agree with plan. The  Patient had discussed starting ritalin, will not start this medication for this patient at this time.  Jacqulyn Cane, M.D.  07/10/2012 12:33 PM

## 2012-07-11 DIAGNOSIS — F332 Major depressive disorder, recurrent severe without psychotic features: Secondary | ICD-10-CM

## 2012-07-11 NOTE — Progress Notes (Signed)
[facility] MD Progress Note  07/11/2012 11:26 AM Katelyn [name]  MRN:  [id number]  Subjective:  The patient is a 37 y/o woman with a patThe patient reports that she has feeling more "clear" since starting Ritalin. She states that her fatigue has improved. She denies any side effects from initiating ritalin. She states that she notice some improvement of depression with ritalin. She admits to not taking geodon with food in the past 2 months as well, but reports eating while inpatient during the current admission. She admits to poor coping skills with minimal stressors which have led to suicidal thoughts in the past. She reports that she is taking her medications and denies any side effects.   Diagnosis:   Axis I: Major Depression, Recurrent severe  Axis II: No diagnosis Axis III:  Past Medical History  Diagnosis Date  . Obesity   . Skin lesion     Excisional biopsy  . Depression     several suicide attempts, hospitaluzed in 2012 for this  . Dizziness - light-headed   . Polycystic ovary   . Anxiety   . Chronic headaches   . IBS (irritable bowel syndrome)   . History of pneumonia     x 3  . Benign juvenile melanoma   . Colon polyps     found on colonoscopy 04/26/2012  . Sleep apnea     has lost 100lbs.   Axis IV: other psychosocial or environmental problems Axis V: 41-50 serious symptoms  ADL's:  Intact  Sleep: Good  Appetite:  Good  Suicidal Ideation:  Plan:  Patient denies. Intent:  Patient denies. Means:  Patient denies. Homicidal Ideation:  Plan:  Patient denies. Intent:  Patient denies. Means:  Patient denies. AEB (as evidenced by):  Psychiatric Specialty Exam: Review of Systems  Constitutional: Positive for malaise/fatigue. Negative for fever, chills and diaphoresis.  Respiratory: Negative for cough, shortness of breath and wheezing.   Cardiovascular: Negative for chest pain, palpitations and leg swelling.  Gastrointestinal: Positive for constipation. Negative for  nausea, vomiting, abdominal pain and diarrhea.  Neurological: Positive for headaches (Chronic since having ECTs). Negative for dizziness and seizures.    Blood pressure 98/62, pulse 90, temperature 97.8 F (36.6 C), temperature source Oral, resp. rate 16, height 6' 6.5" (1.994 m), last menstrual period 06/25/2012.There is no weight on file to calculate BMI.  General Appearance: Fairly Groomed  Patent attorney::  Fair  Speech:  Clear and Coherent and Normal Rate  Volume:  Normal  Mood:  "calmer, less anxious, more focused."  Affect:  Restricted  Thought Process:  Coherent, Linear and Logical  Orientation:  Full (Time, Place, and Person)  Thought Content:  Delusions  Suicidal Thoughts:  No  Homicidal Thoughts:  No  Memory:  Immediate;   Good Recent;   Poor Remote;   Fair  Judgement:  Intact  Insight:  Present  Psychomotor Activity:  Normal  Concentration:  Fair  Recall:  Fair  Akathisia:  Negative  Handed:  Right  AIMS (if indicated):   As noted in chart  Assets:  Communication Skills Desire for Improvement Financial Resources/Insurance Housing Intimacy Physical Health Social Support Transportation  Sleep:  Number of Hours: 6.75   Current Medications: Current Facility-Administered Medications  Medication Dose Route Frequency Provider Last Rate Last Dose  . alum & mag hydroxide-simeth (MAALOX/MYLANTA) 200-200-20 MG/5ML suspension 30 mL  30 mL Oral Q4H PRN Larena Sox, MD      . aspirin-acetaminophen-caffeine (EXCEDRIN MIGRAINE) per tablet 1 tablet  1 tablet Oral Q6H PRN Larena Sox, MD   1 tablet at 07/05/12 2050  . calcium carbonate (TUMS - dosed in mg elemental calcium) chewable tablet 1,000 mg  1,000 mg Oral Q6H PRN Larena Sox, MD      . clonazePAM (KLONOPIN) tablet 1 mg  1 mg Oral BID PRN Himabindu Ravi, MD   1 mg at 07/10/12 1715  . docusate sodium (COLACE) capsule 200 mg  200 mg Oral BID Himabindu Ravi, MD   200 mg at 07/11/12 0805  . FLUoxetine (PROZAC)  capsule 80 mg  80 mg Oral Daily Himabindu Ravi, MD   80 mg at 07/11/12 0805  . ibuprofen (ADVIL,MOTRIN) tablet 400 mg  400 mg Oral Q6H PRN Himabindu Ravi, MD   400 mg at 07/09/12 1958  . magnesium hydroxide (MILK OF MAGNESIA) suspension 30 mL  30 mL Oral Daily PRN Larena Sox, MD      . metFORMIN (GLUCOPHAGE) tablet 500 mg  500 mg Oral BID WC Himabindu Ravi, MD   500 mg at 07/11/12 0805  . methylphenidate (RITALIN) tablet 5 mg  5 mg Oral Q breakfast Larena Sox, MD   5 mg at 07/11/12 0813  . multivitamin with minerals tablet 1 tablet  1 tablet Oral Daily Larena Sox, MD   1 tablet at 07/11/12 0805  . thiamine (B-1) injection 100 mg  100 mg Intramuscular Once Larena Sox, MD      . thiamine (VITAMIN B-1) tablet 100 mg  100 mg Oral Daily Larena Sox, MD   100 mg at 07/11/12 0804  . traZODone (DESYREL) tablet 200 mg  200 mg Oral QHS PRN Himabindu Ravi, MD   200 mg at 07/10/12 2146  . ziprasidone (GEODON) capsule 40 mg  40 mg Oral Daily Himabindu Ravi, MD   40 mg at 07/11/12 0805  . ziprasidone (GEODON) capsule 80 mg  80 mg Oral q1800 Larena Sox, MD   80 mg at 07/10/12 1814    Lab Results: No results found for this or any previous visit (from the past 48 hour(s)).  Physical Findings: AIMS: Facial and Oral Movements Muscles of Facial Expression: None, normal Lips and Perioral Area: None, normal Jaw: None, normal Tongue: None, normal,Extremity Movements Upper (arms, wrists, hands, fingers): None, normal Lower (legs, knees, ankles, toes): None, normal, Trunk Movements Neck, shoulders, hips: None, normal, Overall Severity Severity of abnormal movements (highest score from questions above): None, normal Incapacitation due to abnormal movements: None, normal Patient's awareness of abnormal movements (rate only patient's report): No Awareness, Dental Status Current problems with teeth and/or dentures?: No Does patient usually wear dentures?: No  CIWA:  CIWA-Ar Total:  0 COWS:     Treatment Plan Summary: Daily contact with patient to assess and evaluate symptoms and progress in treatment Medication management Continue current medications.  Plan: Will continue current medications.  Continue to provide safe, secure, environment on the unit. Patient has an outpatient provider she will go to on discharge, but reports she would also like to have a therapist.  Medical Decision Making Problem Points:  Established problem, stable/improving (1) Data Points:  Order Aims Assessment (2) Review of medication regiment & side effects (2) Review of new medications or change in dosage (2)  I certify that inpatient services furnished can reasonably be expected to improve the patient's condition.   Roxie Kreeger 07/11/2012, 11:26 AM

## 2012-07-11 NOTE — Progress Notes (Signed)
Adult Psychoeducational Group Note  Date:  07/11/2012 Time:  10:22 PM  Group Topic/Focus:  Wrap-Up Group:   The focus of this group is to help patients review their daily goal of treatment and discuss progress on daily workbooks.  Participation Level:  Minimal  Participation Quality:  Appropriate  Affect:  Depressed  Cognitive:  Appropriate  Insight: Limited  Engagement in Group:  Limited  Modes of Intervention:  Discussion  Additional Comments: Katelyn [name] stated that she did not set a goal for today. When asked about her day, pt stated that she had an "okay" day, but that she felt down because the weather was dreary. Pt stated that she had some good conversations with other patients today, and that she felt less anxiety today due to having a routine.   Katelyn [name] 07/11/2012, 10:22 PM

## 2012-07-11 NOTE — Progress Notes (Signed)
Katelyn [name] says she slept better last night. She is more relaxed this  Morning as evidenced by her approaching the med window directly after breakfast and requesting her medication. She makes clearer eye contact. She almost smiles as she is conversing with this nurse.    A Ritalin was restarted last night per DR. Puthevel.  Pt completed her AM self inventory  And on it she wrote she denied SI within the past 24 hrs, and she rated her feelings of depression and hopelessness " 5 / 5 " , respectively.   R POC is in place and thera peuitc relationship is fostered.

## 2012-07-11 NOTE — Progress Notes (Addendum)
Spoke with pt 1:1 at length regarding her depression, anxiety, after care plan and safety plan upon discharge. Pt expresses readiness to d/c tomorrow however is anxious. "This has been a good, quiet break. I think I'm ready to face the chaos at home (referencing her children). States family is "tired of her depressive episodes." "I have asked them to help me identify when I start to go down hill. I hope they will help." Encouraged pt to express the alternative to her family - her potentially suiciding - if they are not willing to support and help her. Explained appropriate assertiveness techniques. Also encouraged pt to develop a safety plan should the SI return." Pt receptive. States she has a "go to" person. She denies SI/HI/AVH and remains safe. Lawrence Marseilles

## 2012-07-11 NOTE — Clinical Social Work Note (Signed)
[facility] Group Notes:  (Clinical Social Work)  07/11/2012   3:00-4:00PM  Summary of Progress/Problems:    The main focus of today's process group was to define "support" and describe what healthy supports are.  We then discussed how and why to increase patient supports, using motivational interviewing.  An emphasis was placed on using counselor, doctor, therapy groups, self-help groups and problem-specific support groups to expand supports.   The patient expressed she has some good supports, has supported people in the past and has some knowledge of how challenging it can be.  She demonstrated concern and empathy for a fellow group member who was crying and feeling very isolated.  Type of Therapy:  Process Group  Participation Level:  Active  Participation Quality:  Appropriate, Attentive, Sharing and Supportive  Affect:  Anxious and Blunted  Cognitive:  Alert, Appropriate and Oriented  Insight:  Engaged  Engagement in Therapy:  Engaged  Modes of Intervention:  Education,  Teacher, English as a foreign language, Exploration, Discussion   Ambrose Mantle, LCSW 07/11/2012, 4:41 PM

## 2012-07-11 NOTE — Progress Notes (Signed)
Psychoeducational Group Note  Psychoeducational Group Note  Date: 07/11/2012 Time:  07/11/2012  Group Topic/Focus:  Gratefulness:  The focus of this group is to help patients identify what two things they are most grateful for in their lives. What helps ground them and to center them on their work to their recovery.  Participation Level:  Active  Participation Quality:  Appropriate  Affect:  Appropriate  Cognitive:  Appropriate  Insight:  Improving  Engagement in Group:  Engaged  Additional Comments:    Dione Housekeeper

## 2012-07-12 ENCOUNTER — Encounter (HOSPITAL_COMMUNITY): Payer: Self-pay | Admitting: Psychiatry

## 2012-07-12 ENCOUNTER — Ambulatory Visit (INDEPENDENT_AMBULATORY_CARE_PROVIDER_SITE_OTHER): Payer: 59 | Admitting: Psychiatry

## 2012-07-12 VITALS — BP 105/66 | HR 68 | Wt 220.6 lb

## 2012-07-12 DIAGNOSIS — F329 Major depressive disorder, single episode, unspecified: Secondary | ICD-10-CM

## 2012-07-12 DIAGNOSIS — F323 Major depressive disorder, single episode, severe with psychotic features: Secondary | ICD-10-CM

## 2012-07-12 MED ORDER — TRAZODONE HCL 100 MG PO TABS: 200.0000 mg | ORAL_TABLET | Freq: Every evening | ORAL | Status: DC | PRN

## 2012-07-12 MED ORDER — ADULT MULTIVITAMIN W/MINERALS CH: 1.0000 | ORAL_TABLET | Freq: Every day | ORAL | Status: DC

## 2012-07-12 MED ORDER — CLONAZEPAM 1 MG PO TABS: 1.0000 mg | ORAL_TABLET | Freq: Two times a day (BID) | ORAL | Status: DC | PRN

## 2012-07-12 MED ORDER — METHYLPHENIDATE HCL 5 MG PO TABS: 5.0000 mg | ORAL_TABLET | Freq: Every day | ORAL | Status: DC

## 2012-07-12 MED ORDER — METFORMIN HCL 500 MG PO TABS: 500.0000 mg | ORAL_TABLET | Freq: Two times a day (BID) | ORAL | Status: DC

## 2012-07-12 MED ORDER — FLUOXETINE HCL 40 MG PO CAPS: 80.0000 mg | ORAL_CAPSULE | Freq: Every day | ORAL | Status: DC

## 2012-07-12 MED ORDER — DOCUSATE SODIUM 100 MG PO CAPS: 200.0000 mg | ORAL_CAPSULE | Freq: Two times a day (BID) | ORAL | Status: DC

## 2012-07-12 MED ORDER — ZIPRASIDONE HCL 80 MG PO CAPS: 80.0000 mg | ORAL_CAPSULE | Freq: Every day | ORAL | Status: DC

## 2012-07-12 MED ORDER — ZIPRASIDONE HCL 40 MG PO CAPS: 40.0000 mg | ORAL_CAPSULE | Freq: Every day | ORAL | Status: DC
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
## 2012-07-12 NOTE — Progress Notes (Signed)
Patient ID: Katelyn [name], female   DOB: [birth year], 37 y.o.   MRN: [id number] Patient was discharged ambulatory to drive herself home.  Prior to that she is going to have appt with Dr. Florentina Jenny , so patient was walked to outpatient lobby for appt.  She denies SI/HI.  She verbalizes understanding of her d/c meds and her followup.  She was given scripts.
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
## 2012-07-12 NOTE — Tx Team (Addendum)
Interdisciplinary Treatment Plan Update   Date Reviewed:  07/12/2012  Time Reviewed:  9:42 AM  Progress in Treatment:   Attending groups: Yes Participating in groups: Yes Taking medication as prescribed: Yes  Tolerating medication: Yes Family/Significant other contact made:  Yes, contact made with husband Patient understands diagnosis: Yes  Discussing patient identified problems/goals with staff: Yes Medical problems stabilized or resolved: Yes Denies suicidal/homicidal ideation: Yes Patient has not harmed self or others: Yes  For review of initial/current patient goals, please see plan of care.  Estimated Length of Stay: Discharge home today  Reasons for Continued Hospitalization:   New Problems/Goals identified:    Discharge Plan or Barriers:   Home with outpatient follow up with [facility]  Additional Comments:  Patient reports being better today.  She advised MD the Ritalin seems to help.  She denies SI/HI and rates anxiety at six and all other symptoms at four.   Attendees:  Patient:   Katelyn [name] 07/12/2012 9:42 AM   Signature: Patrick North, MD 07/12/2012 9:42 AM  Signature:   07/12/2012 9:42 AM  Signature:   Neill Loft, RN 07/12/2012 9:42 AM  Signature: 07/12/2012 9:42 AM  Signature:  07/12/2012 9:42 AM  Signature:  Juline Patch, LCSW 07/12/2012 9:42 AM  Signature: Silverio Decamp, PMH-NP 07/12/2012 9:42 AM  Signature:  07/12/2012 9:42 AM  Signature: 07/12/2012 9:42 AM  Signature:    Signature:    Signature:      Scribe for Treatment Team:   Juline Patch,  07/12/2012 9:42 AM
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
## 2012-07-12 NOTE — Progress Notes (Signed)
[facility] Adult Case Management Discharge Plan :  Will you be returning to the same living situation after discharge: Yes,  Patient to return to her home with husband. At discharge, do you have transportation home?:Yes,  Patient to arrange transportation home. Do you have the ability to pay for your medications:Yes,  Patient is able to afford medications.  Release of information consent forms completed and in the chart;  Patient's signature needed at discharge.  Patient to Follow up at: Follow-up Information   Follow up with Dr. Lolly Mustache- [facility] On 07/12/2012. (You are scheduled to see Dr. Lolly Mustache on Monday, July 12, 2012 at 1:45)    Contact information:   [address] [place], [place]   [address]  [phone]      Follow up with [name] [facility] On 07/26/2012. (Monday, July 26, 2012 at 2:00 PM)    Contact information:   [phone]      Patient denies SI/HI:   Yes,  Patient is not endorsing SI/HI or thoughts of self harm.    Safety Planning and Suicide Prevention discussed:  Yes,  Reviewed during after care group.  Durrell Barajas Ha[place]on 07/12/2012, 10:00 AM

## 2012-07-14 ENCOUNTER — Other Ambulatory Visit (HOSPITAL_COMMUNITY): Payer: Self-pay | Admitting: Psychiatry

## 2012-07-14 ENCOUNTER — Telehealth (HOSPITAL_COMMUNITY): Payer: Self-pay

## 2012-07-14 MED ORDER — BUPROPION HCL ER (XL) 150 MG PO TB24: 150.0000 mg | ORAL_TABLET | ORAL | Status: DC

## 2012-07-14 NOTE — Telephone Encounter (Signed)
Call returned. Patient like to try Wellbutrin and stop Ritalin. Ritalin requires PA. She had tried Wellbutrin before without any side effects. Will discontinue Ritalin and start Wellbutrin XL 150 mg daily. Recommended to call us back if she has any questions or experience any side effects.

## 2012-07-15 ENCOUNTER — Ambulatory Visit (HOSPITAL_COMMUNITY): Payer: Self-pay | Admitting: Psychiatry

## 2012-07-15 NOTE — Progress Notes (Signed)
Patient Discharge Instructions:  Next Level Care Provider Has Access to the EMR, 07/15/12 Records provided to [facility] via [facility]/Epic access.  Jerelene Redden, 07/15/2012, 11:55 AM

## 2012-07-21 ENCOUNTER — Ambulatory Visit (INDEPENDENT_AMBULATORY_CARE_PROVIDER_SITE_OTHER): Payer: 59 | Admitting: Psychiatry

## 2012-07-21 ENCOUNTER — Encounter (HOSPITAL_COMMUNITY): Payer: Self-pay | Admitting: Psychiatry

## 2012-07-21 VITALS — BP 122/69 | HR 61 | Wt 218.2 lb

## 2012-07-21 DIAGNOSIS — F329 Major depressive disorder, single episode, unspecified: Secondary | ICD-10-CM

## 2012-07-21 DIAGNOSIS — F323 Major depressive disorder, single episode, severe with psychotic features: Secondary | ICD-10-CM

## 2012-07-21 MED ORDER — FLUOXETINE HCL 40 MG PO CAPS: ORAL_CAPSULE | ORAL | Status: DC
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
## 2012-07-23 ENCOUNTER — Encounter: Payer: Self-pay | Admitting: Family Medicine

## 2012-07-26 ENCOUNTER — Ambulatory Visit (INDEPENDENT_AMBULATORY_CARE_PROVIDER_SITE_OTHER): Payer: 59 | Admitting: Psychiatry

## 2012-07-26 DIAGNOSIS — F333 Major depressive disorder, recurrent, severe with psychotic symptoms: Secondary | ICD-10-CM

## 2012-07-26 NOTE — Progress Notes (Signed)
   THERAPIST PROGRESS NOTE  Session Time: 2:00-2:50 pm  Participation Level: Active  Behavioral Response: Casual, Neat and Well GroomedAlertAnxious and Depressed  Type of Therapy: Individual Therapy  Treatment Goals addressed: Coping  Interventions: CBT, Solution Focused and Supportive  Summary: Katelyn [name] is a 37 y.o. female who presents with mild anxious and depressed mood and affect. This is patients first session with Clinical research associate following her most recent inpatient hospitalization at writers request. Patient reports feeling "significant improvement" and states she is grateful she went into the hospital. She was able to identify the items that helped improve her functioning and stated going into the hospital allowed her a break from home stress, the Geodon and other medications are working better now that she is eating and she is sleeping more (six hours per night reported). Patient is clear thinking, goal directed and able to make her own decisions easily. Patient is not placing extra power on her relationship with her neighbor Katelyn [name]) and has insight into the "co-dependent" nature of this relationship when patient is "unwell". Patient states she had one appointment to focus on her eating disorder with therapist, Katelyn [name], and patient states that went well and she is looking forward to doing more work with her. Patient stats she signed up with Katelyn [name] through the [facility] and is scheduled to attend WRAP training next week. Patient has another appointment this week with writer and wants to focus on exploring welness tools now while she is doing well.   Suicidal/Homicidal: Nowithout intent/plan  Therapist Response: Assessed overall level of functioning following hospitalization. Reviewed discharge plan and treatment going forward.   Plan: Return again in 1 weeks. Explore what makes patient "happy" homework from therapist, Katelyn [name].   Diagnosis: Axis I: Depression, Major,  Severe, with psychosis    Axis II: No diagnosis    Katelyn Ching, LCSW 07/26/2012

## 2012-07-29 ENCOUNTER — Ambulatory Visit (INDEPENDENT_AMBULATORY_CARE_PROVIDER_SITE_OTHER): Payer: Medicare Other | Admitting: Psychiatry

## 2012-07-29 DIAGNOSIS — F333 Major depressive disorder, recurrent, severe with psychotic symptoms: Secondary | ICD-10-CM

## 2012-07-29 NOTE — Progress Notes (Signed)
   THERAPIST PROGRESS NOTE  Session Time: 2:00-2:50 pm  Participation Level: Active  Behavioral Response: Neat and Well GroomedAlertEuthymic  Type of Therapy: Individual Therapy  Treatment Goals addressed: Coping  Interventions: CBT, Solution Focused and Supportive  Summary: Katelyn [name] is a 37 y.o. female who presents with mild anxious and depressed mood. Patient states her depression and anxiety continue to remain stable with minimal symptoms, but states she has noticed a decrease in energy and motivation. She is unable to identify the cause, but does not feel like it is from feeling overwhelmed with home responsibilities. Patient talked about a need to work towards getting her two children (9 and 6 1/2) back to sleeping in their own beds at night instead of with her and her husband. Patient states she has allowed her youngest child (daughter) to sleep with them for a long time, but now her older child (son) also wants to sleep with them and patients husband is setting limits with this. Patient states she understands the reasoning, but is worried about implementing the change. She states she talked with the children last night and informed them that the change is coming to prepare them. She explored options for making the change easier during the session. Patient describes her daughter as having symptoms of "over attachment". Patient wanted to explore items that make her happy during this session which was a homework assignment from her other counselor that patient wanted help with. Patient was able to identify about twelve items that bring her enjoyment or happiness, whereas at the start of the session, she only had one.     Suicidal/Homicidal: Nowithout intent/plan  Therapist Response: Assessed overall level of depression and anxiety. Used CBT to explore ways to help patient work towards getting her children to sleep in their own rooms. Explored things that bring patient happiness.    Plan: Return again in 1 weeks.  Diagnosis: Axis I: Depression, major, recurrent, severe with psychosis    Axis II: No diagnosis    Karla Vines E, LCSW 07/29/2012

## 2012-08-05 ENCOUNTER — Ambulatory Visit (INDEPENDENT_AMBULATORY_CARE_PROVIDER_SITE_OTHER): Payer: 59 | Admitting: Psychiatry

## 2012-08-05 ENCOUNTER — Encounter (HOSPITAL_COMMUNITY): Payer: Self-pay | Admitting: Psychiatry

## 2012-08-05 VITALS — BP 128/77 | HR 60 | Wt 221.3 lb

## 2012-08-05 DIAGNOSIS — F329 Major depressive disorder, single episode, unspecified: Secondary | ICD-10-CM

## 2012-08-05 DIAGNOSIS — F323 Major depressive disorder, single episode, severe with psychotic features: Secondary | ICD-10-CM

## 2012-08-05 MED ORDER — ZIPRASIDONE HCL 40 MG PO CAPS: 40.0000 mg | ORAL_CAPSULE | Freq: Every day | ORAL | Status: DC

## 2012-08-05 MED ORDER — ZIPRASIDONE HCL 80 MG PO CAPS: 80.0000 mg | ORAL_CAPSULE | Freq: Every day | ORAL | Status: DC

## 2012-08-05 MED ORDER — BUPROPION HCL ER (XL) 150 MG PO TB24: 150.0000 mg | ORAL_TABLET | ORAL | Status: DC

## 2012-08-05 MED ORDER — TRAZODONE HCL 100 MG PO TABS: 200.0000 mg | ORAL_TABLET | Freq: Every evening | ORAL | Status: DC | PRN

## 2012-08-05 MED ORDER — FLUOXETINE HCL 40 MG PO CAPS: ORAL_CAPSULE | ORAL | Status: DC
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
## 2012-08-06 ENCOUNTER — Ambulatory Visit (INDEPENDENT_AMBULATORY_CARE_PROVIDER_SITE_OTHER): Payer: Medicare Other | Admitting: Psychiatry

## 2012-08-06 DIAGNOSIS — F333 Major depressive disorder, recurrent, severe with psychotic symptoms: Secondary | ICD-10-CM
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
## 2012-08-17 ENCOUNTER — Ambulatory Visit (HOSPITAL_COMMUNITY): Payer: Self-pay | Admitting: Psychiatry

## 2012-08-26 ENCOUNTER — Ambulatory Visit (INDEPENDENT_AMBULATORY_CARE_PROVIDER_SITE_OTHER): Payer: Medicare Other | Admitting: Psychiatry

## 2012-08-26 DIAGNOSIS — F333 Major depressive disorder, recurrent, severe with psychotic symptoms: Secondary | ICD-10-CM | POA: Diagnosis not present

## 2012-08-26 NOTE — Progress Notes (Signed)
   THERAPIST PROGRESS NOTE  Session Time: 2:00-2:50 pm  Participation Level: Active  Behavioral Response: Neat and Well GroomedAlertDepressed  Type of Therapy: Individual Therapy  Treatment Goals addressed: Coping  Interventions: Solution Focused  Summary: Katelyn [name] is a 37 y.o. female who presents with moderate depressed mood and affect. Patient still presents with a high level of functioning but reports an increase in her depression symptoms. She identified a decrease in motivation, not getting dressed in the morning, difficulty making decisions, feeling easily overwhelmed with tasks and responsibilities, an increase in "all or nothing" thinking, return to her obsession with diet and weight loss. Patient states she has started dieting again and is stopping the assignments requested by her eating disorder therapist Risk analyst). Patient said "I need to just get away" and clarified that she is feeling overwhelmed with doing so much that she just needs a break. Patient identified triggers to her symptoms including; Dr. Lolly Mustache lowered her Prozac three weeks age in an attempt to decrease sexual side affects, Patient decreased her appointments with Noni Saupe and moved them out to monthly, Patient identified a need for a break from her children. Patient created an action plan that involves the following; Contacting Dr. Lolly Mustache to explore going up on the Wellbutrin to compensate for the Prozac reduction, Contacting Engineer, manufacturing systems for an appointment ASAP to address eating disorder, Talking with her mom about taking the children so patient can go to the beach next weekend for a break. Patient is starting to identify depression symptoms and intervene sooner. This is the first time patient was this active in creating an action plan.   Suicidal/Homicidal: Nowithout intent/plan  Therapist Response: Assessed overall level of depression and increase in symptoms, explored triggers to the  depression and created an action plan of intervention.   Plan: Return again in 2 weeks.  Diagnosis: Axis I: Depression, Major, Recurrent Severe with pscyhosis    Axis II: No diagnosis    Aureliano Oshields E, LCSW 08/26/2012

## 2012-08-27 ENCOUNTER — Telehealth (HOSPITAL_COMMUNITY): Payer: Self-pay

## 2012-08-30 ENCOUNTER — Telehealth (HOSPITAL_COMMUNITY): Payer: Self-pay | Admitting: Psychiatry

## 2012-08-30 NOTE — Telephone Encounter (Signed)
Spoke to Patient, wants to increase Wellbutrin since Prozac decrease. Feel sad and decrease energy. Rec to try Wellbutrin 300 mg and call us back about response. F/U in 2 weeks.

## 2012-09-02 ENCOUNTER — Ambulatory Visit: Payer: Self-pay | Admitting: Family Medicine

## 2012-09-06 ENCOUNTER — Telehealth (HOSPITAL_COMMUNITY): Payer: Self-pay

## 2012-09-06 DIAGNOSIS — F329 Major depressive disorder, single episode, unspecified: Secondary | ICD-10-CM

## 2012-09-06 MED ORDER — BUPROPION HCL ER (XL) 150 MG PO TB24: 150.0000 mg | ORAL_TABLET | ORAL | Status: DC

## 2012-09-06 NOTE — Telephone Encounter (Signed)
Patient like to reduce Wellbutrin to 150 mg.  She does not feel good when she tried 300 mg.  For now she likes to continue Prozac 40 mg and Wellbutrin 150 mg.  She needed new refill of Wellbutrin.  We will call her pharmacy for Wellbutrin XL 150 mg daily.

## 2012-09-07 ENCOUNTER — Other Ambulatory Visit (HOSPITAL_COMMUNITY): Payer: Self-pay | Admitting: Psychiatry

## 2012-09-07 DIAGNOSIS — F329 Major depressive disorder, single episode, unspecified: Secondary | ICD-10-CM

## 2012-09-07 MED ORDER — ZIPRASIDONE HCL 40 MG PO CAPS: 40.0000 mg | ORAL_CAPSULE | Freq: Every day | ORAL | Status: DC

## 2012-09-09 ENCOUNTER — Ambulatory Visit (INDEPENDENT_AMBULATORY_CARE_PROVIDER_SITE_OTHER): Payer: Medicare Other | Admitting: Psychiatry

## 2012-09-09 DIAGNOSIS — F333 Major depressive disorder, recurrent, severe with psychotic symptoms: Secondary | ICD-10-CM

## 2012-09-09 NOTE — Progress Notes (Signed)
   THERAPIST PROGRESS NOTE  Session Time: 2:00-2:50 pm  Participation Level: Active  Behavioral Response: Neat and Well GroomedAlertEuthymic  Type of Therapy: Individual Therapy  Treatment Goals addressed: Coping  Interventions: Solution Focused and Supportive  Summary: Katelyn [name] is a 37 y.o. female who presents with euthymic mood and affect. Patient reports a reduction in depression and anxiety symptoms and states she is feeling close to her normal. Patient believes her depression and eating disorder symptoms increase during the week before and during her menstrual cycle and patient plans to discuss this with her psychiatrist at her next appointment. This was the first time since patient has been working with Clinical research associate that she has been able to identify a depression relapse and take action to intervene and reduce the symptoms to allow herself to return to baseline without the symptoms increasing.  Patient processed fear and uncertainty with her husband getting a new job that starts this Monday and the change this will cause to patients and her childrens schedule. Patient explored how she will now be alone in the nights with her children while her husband works nights. Patient is able to think and problem solve and does not appear overwhelmed by this change. Patient also explored the power and control issues in her friendship with [name], but is still uncertain about making changes in this area.   Suicidal/Homicidal: Nowithout intent/plan  Therapist Response: Assessed level of depression and acknowledged patients success in identifying and reducing her symptoms. Explored current stressors and provided emotional support.   Plan: Return again in 1 weeks.  Diagnosis: Axis I: Depression, major, recurrent severe with psychosis    Axis II: No diagnosis    Carman Ching, LCSW 09/09/2012

## 2012-09-17 ENCOUNTER — Ambulatory Visit (INDEPENDENT_AMBULATORY_CARE_PROVIDER_SITE_OTHER): Payer: 59 | Admitting: Psychiatry

## 2012-09-17 ENCOUNTER — Encounter (HOSPITAL_COMMUNITY): Payer: Self-pay | Admitting: Psychiatry

## 2012-09-17 VITALS — BP 130/86 | HR 76 | Wt 229.0 lb

## 2012-09-17 DIAGNOSIS — F323 Major depressive disorder, single episode, severe with psychotic features: Secondary | ICD-10-CM

## 2012-09-17 DIAGNOSIS — F329 Major depressive disorder, single episode, unspecified: Secondary | ICD-10-CM

## 2012-09-17 MED ORDER — CLONAZEPAM 1 MG PO TABS: 1.0000 mg | ORAL_TABLET | Freq: Two times a day (BID) | ORAL | Status: DC | PRN

## 2012-09-17 MED ORDER — ZIPRASIDONE HCL 40 MG PO CAPS: ORAL_CAPSULE | ORAL | Status: DC

## 2012-09-17 MED ORDER — BUPROPION HCL ER (XL) 150 MG PO TB24: 150.0000 mg | ORAL_TABLET | ORAL | Status: DC

## 2012-09-17 MED ORDER — TOPIRAMATE 25 MG PO TABS: 25.0000 mg | ORAL_TABLET | Freq: Every day | ORAL | Status: DC
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
## 2012-09-23 ENCOUNTER — Ambulatory Visit (INDEPENDENT_AMBULATORY_CARE_PROVIDER_SITE_OTHER): Payer: 59 | Admitting: Psychiatry

## 2012-09-23 DIAGNOSIS — F333 Major depressive disorder, recurrent, severe with psychotic symptoms: Secondary | ICD-10-CM

## 2012-09-23 NOTE — Progress Notes (Signed)
   THERAPIST PROGRESS NOTE  Session Time: 2:00-2:50 pm  Participation Level: Active  Behavioral Response: Neat and Well GroomedAlertAnxious  Type of Therapy: Individual Therapy  Treatment Goals addressed: Anxiety  Interventions: Solution Focused, Strength-based and Supportive  Summary: Katelyn [name] is a 37 y.o. female who presents with moderate anxious mood and affect, but denies depression symptoms. Pt showed a wide range of affect including smiling and laughing. Pt appears to be functioning at a high level. Pt is aware of the progress she has made and referred to how she was when she first stared therapy. Pt expressed feeling very stressed due to several life changes including; her husband starting a new job working the night shift, her friend [name] getting upset with Pt for setting healthy limits with Pt and Pts daughter struggling with her sleep routine and Pt sitting with her in her bedroom every night until the daughter falls asleep. Pt talked about how she is managing all of this change and was proud of herself for not going into the hospital, whereas she would have if all of this would have occurred to her earlier in her treatment. Pt explored solutions to making her evenings less stressful with the kids and created a new bedtime plan to try this week to help put her daughter to sleep. Pt is getting a voice of her own an standing up for herself with others. She presents with confidence and appears happy. Pt states she is still working with [facility] on her eating disorder and states she feels like it is helping. Pt said her goals are working on her social anxiety by having something planned outside of the home daily and meeting with a nutritionist.  Pt is down to therapy appointments every two weeks and has been maintaining stable mood with this frequency of sessions.   Suicidal/Homicidal: Nowithout intent/plan  Therapist Response: Assessed level of depression and anxiety,  reviewed progress in treatment based on things Pt shared occurred since last session, used solution focused therapy to create new routines to reduce stress in the family and reviewed progress with eating disorder specialist.   Plan: Return again in 2  weeks.  Diagnosis: Axis I: Depression, Major Recurrent, Severe with psychosis and Anxiety NOS    Axis II: No diagnosis    Mickael Mcnutt E, LCSW 09/23/2012

## 2012-09-27 ENCOUNTER — Telehealth (HOSPITAL_COMMUNITY): Payer: Self-pay | Admitting: *Deleted

## 2012-09-27 ENCOUNTER — Telehealth (HOSPITAL_COMMUNITY): Payer: Self-pay | Admitting: Psychiatry

## 2012-09-27 NOTE — Telephone Encounter (Signed)
VM: Topamax not working.Wants to know if dose can be increased.

## 2012-09-27 NOTE — Telephone Encounter (Signed)
Patient called requesting to increased Topamax.  She's taking 25 mg.  She has difficulty losing weight.  She has no side effects.  I recommend to try 50 mg Topamax and let us know if she has any side effects.

## 2012-10-04 ENCOUNTER — Telehealth (HOSPITAL_COMMUNITY): Payer: Self-pay | Admitting: *Deleted

## 2012-10-04 ENCOUNTER — Other Ambulatory Visit (HOSPITAL_COMMUNITY): Payer: Self-pay | Admitting: Psychiatry

## 2012-10-04 DIAGNOSIS — F329 Major depressive disorder, single episode, unspecified: Secondary | ICD-10-CM

## 2012-10-04 MED ORDER — TOPIRAMATE 25 MG PO TABS: 25.0000 mg | ORAL_TABLET | Freq: Two times a day (BID) | ORAL | Status: DC

## 2012-10-04 NOTE — Telephone Encounter (Signed)
Requests refill for Topamax. Was increased to 50mg , needs that strength, not 25 mg.

## 2012-10-12 ENCOUNTER — Encounter: Payer: Self-pay | Admitting: Family Medicine

## 2012-10-12 ENCOUNTER — Ambulatory Visit (INDEPENDENT_AMBULATORY_CARE_PROVIDER_SITE_OTHER): Payer: 59 | Admitting: Family Medicine

## 2012-10-12 VITALS — Ht 66.0 in | Wt 227.8 lb

## 2012-10-12 DIAGNOSIS — F509 Eating disorder, unspecified: Secondary | ICD-10-CM

## 2012-10-12 NOTE — Progress Notes (Signed)
Medical Nutrition Therapy:  Appt start time: 1000 end time:  1100.  Assessment:  Primary concerns today: Weight management and eating disorder.  Katelyn [name] lost 100 lb in the last 2 yrs, but has regained ~20 lb, which worries her.  She said she has tried every diet there is.  She was diagnosed w/ DM2 a couple yrs ago, and was careful with carb's for a while.  She has subsequently restricted severely, as well as abused laxatives, and purged.  She now feels like she is never full, so can't manage to lose weight.  She wants to lose weight in a healthy way.  Katelyn [name] has not purged or used laxative for at least a couple months.  Katelyn [name] lives with her husband, and 9-YO son and 6-YO daughter.   Katelyn [name] has suffered from depression and anxiety since adolescence.  She has had ECT twice.  She sees Katelyn [name] every two weeks for therapy as well as Katelyn [name], Katelyn [name] 1 X month.    Usual eating pattern includes 3 meals and 1-4 snacks per day. Usual physical activity includes 30-60 min cardio ex at the [facility] 5 X wk.  Frequent foods include fruit, salads, bagels, 24 oz diet soda/day.  Avoided foods include milk (disliked).    24-hr recall: (Up at 4 AM) B (8 AM)-   BLT w/ 1/2 pc bacon, fat-free Miracle Whip, diet soda Snk ( AM)-   none L ( PM)-  1/2 baked potato, 1/2 slc toast, 1/2 tsp butter, 1 c squash & onions, 3 oz steak, 1/2 c mixed peas, corn, green beans, carrots, choc cake Snk ( PM)-  12 oz diet soda D (5 PM)-  1/2 c rice, 1/2 c chx & veg's, 1/2 c potato salad Snk (8 PM)-  1 pc cheesecake Snk (9 PM)-  2 bites chx salad, 4 saltines Yesterday was a non-binge day; on some days [name] might eat 3 frozen meals, multiple cookies, chips, etc.  She tries to keep these snack foods and sweets out of the house.   [name] usually gets about 6 hrs of sleep a night; can't sleep past 3, 4, or 5 AM.    Progress Towards Goal(s):  In progress.   Nutritional Diagnosis:  NB-1.2 Harmful beliefs/attitudes about food or  nutrition-related topics (use with caution) As related to food and weight anxiety.  As evidenced by recent history of binge eating with sense of being out of control.    Intervention:  Nutrition counseling.  Monitoring/Evaluation:  Dietary intake, exercise, and body weight in 3 week(s).

## 2012-10-12 NOTE — Patient Instructions (Addendum)
-   Appetite management:   - Substantial amount of protein for breakfast, i.e., at least 1 cup of milk or yogurt ([place] is best); or 2 eggs; or 3 oz meat or fish; or 2 oz cheese.    - Also include some carb at breakfast. Aim for high-fiber carbohydrates, ie. fruit or cereal with at least 5 g fiber/serving, (whole-grain bread).  - Eat at least 3 meals and 1-2 snacks per day.  Aim for no more than 5 hours between eating.  These need to be REAL meals, i.e., would you serve this as a meal to a guest in your home?  - SOLID vs liquid calories.  - [facility]    - Strength training 20 min 2-3 X wk will help increase your metabolic rate.  - Obtain twice as many veg's as protein or carbohydrate foods for both lunch and dinner.  - Make the starchy component of your meal minimal, and if not included, be sure to plan a snack.   - Keep a daily food record, including what, how much, and what time you eat.  If it gets you feeling crazy, let go of the food record.    - Review your food record a few times a week; ask yourself what was in place on days you ate well, and what was triggering on days you made poorer choices.    - Write down what you learn from this process.   - Please schedule at the front desk:  July 7 at 10:30  July 29 at 10   Aug 19 at 10

## 2012-10-14 ENCOUNTER — Ambulatory Visit (INDEPENDENT_AMBULATORY_CARE_PROVIDER_SITE_OTHER): Payer: 59 | Admitting: Psychiatry

## 2012-10-14 DIAGNOSIS — F333 Major depressive disorder, recurrent, severe with psychotic symptoms: Secondary | ICD-10-CM

## 2012-10-14 NOTE — Progress Notes (Signed)
   THERAPIST PROGRESS NOTE  Session Time: 2:30-3:30 pm  Participation Level: Active  Behavioral Response: Neat and Well GroomedAlertAnxious and Depressed  Type of Therapy: Individual Therapy  Treatment Goals addressed: Coping  Interventions: Solution Focused and Other: WRAP Training  Summary: Katelyn [name] is a 38 y.o. female who presents with moderate depressed and anxious mood and affect. Patient reports an increase in depression 8/10 and anxiety 8/10. Patient reports increased thoughts of failure, loss of interest in activities, and difficulty sleeping. Pt states her routine has been changing also, which is a trigger for Pts depression and anxiety symptoms since routine helps Pt feel balanced. Pt said her husbands work schedule is fluctuating from days and nights and Pt tried volunteering yesterday to help fill her time during the day, but it was too anxiety provoking due to the lack of structure. Pt fears having too much time at home is contributing to her depression symptoms increasing and agrees she needs to resume a schedule that involves leaving the home daily.  Pt participated in making a plan for her time to follow for the next couple of days. Pt is scheduled to go to the beach for two weeks, leaving Sunday, to be with her family. Pt states she is still working with the nutritionist and her eating disorder therapist and reports it is going well.   Suicidal/Homicidal: Nowithout intent/plan  Therapist Response: Assessed increase in depression and anxiety symptoms and explored causes, Used WRAP to create a daily plan to mange depression and anxiety symptoms and taught Pt how to use reframing to challenge negative thoughts about self and failure.   Plan: Return again in 3 weeks after Pt returns from two week vacation.  Diagnosis: Axis I: Depression, Major, Recurrent, Severe with psychosis    Axis II: No diagnosis    Carman Ching, LCSW 10/14/2012

## 2012-10-28 ENCOUNTER — Ambulatory Visit (HOSPITAL_COMMUNITY): Payer: Self-pay | Admitting: Psychiatry

## 2012-11-01 ENCOUNTER — Ambulatory Visit (INDEPENDENT_AMBULATORY_CARE_PROVIDER_SITE_OTHER): Payer: 59 | Admitting: Family Medicine

## 2012-11-01 ENCOUNTER — Encounter: Payer: Self-pay | Admitting: Family Medicine

## 2012-11-01 ENCOUNTER — Encounter (HOSPITAL_COMMUNITY): Payer: Self-pay | Admitting: Psychiatry

## 2012-11-01 ENCOUNTER — Ambulatory Visit (INDEPENDENT_AMBULATORY_CARE_PROVIDER_SITE_OTHER): Payer: 59 | Admitting: Psychiatry

## 2012-11-01 VITALS — Ht 66.0 in | Wt 233.8 lb

## 2012-11-01 VITALS — BP 133/68 | HR 71 | Ht 65.0 in | Wt 237.6 lb

## 2012-11-01 DIAGNOSIS — F509 Eating disorder, unspecified: Secondary | ICD-10-CM

## 2012-11-01 DIAGNOSIS — F329 Major depressive disorder, single episode, unspecified: Secondary | ICD-10-CM

## 2012-11-01 DIAGNOSIS — F29 Unspecified psychosis not due to a substance or known physiological condition: Secondary | ICD-10-CM

## 2012-11-01 MED ORDER — CLONAZEPAM 1 MG PO TABS: 1.0000 mg | ORAL_TABLET | Freq: Two times a day (BID) | ORAL | Status: DC | PRN

## 2012-11-01 MED ORDER — BUPROPION HCL ER (XL) 150 MG PO TB24: 150.0000 mg | ORAL_TABLET | ORAL | Status: DC

## 2012-11-01 MED ORDER — TOPIRAMATE 25 MG PO TABS: ORAL_TABLET | ORAL | Status: DC

## 2012-11-01 MED ORDER — ZIPRASIDONE HCL 40 MG PO CAPS: ORAL_CAPSULE | ORAL | Status: DC

## 2012-11-01 NOTE — Patient Instructions (Addendum)
- "  Sticking with the program":  Making good food choices per dietary recommendations provided.   - BUT actually, it means also analyzing why you didn't make those recommended choices and why you did.   - GOALS:  - Physical activity:  At least 30 min 5 X wk.     - Also try to be as active throughout your day as you can, i.e., stairs vs elevator; park far away; micro-breaks (1 in per 20 min or 5 min per hour of sedentary).    - Eat at least 3 meals and 1-2 snacks per day.  Aim for no more than 5 hours between eating.  If you feel hungry, EAT.    - Obtain twice as many veg's as protein or carbohydrate foods for both lunch and dinner.  If you skip the starch, then plan a snack for later.    - PRE-plan your exercise days AND do some advance food prep to make it easy for you.    - Daily food records WITH analyses when you do especially well AND not so wel.

## 2012-11-01 NOTE — Progress Notes (Signed)
Medical Nutrition Therapy:  Appt start time: 1000 end time:  1100.  Assessment:  Primary concerns today: Weight management and eating disorder.  Katelyn [name] said she did not want to come in today.  She just got home from 2 weeks at the beach, and knows her wt is up.  She feels tempted to restrict completely again.   She kept a food record until July 2, which she said helped her stay aware of food choices.  [name] found that having breakfast usually led to better food choices throughout the day.  Where she fell off-track was when she entertained, i.e., eating party leftovers for a couple days after.  Also, during her second week at the beach, she binged on several occasions.  The more she binged, the less she wanted to write her food records.  In analyzing why she binged at the beach, she said she was very unhappy, feeling trapped and feeling a lot of anxiety.  Her anxiety comes from fear of the ocean, especially for her 9-YO son, and she feels uncomfortable being around other women in swim suits at the pool.    Progress Towards Goal(s):  In progress.   Nutritional Diagnosis:  NB-1.2 Harmful beliefs/attitudes about food or nutrition-related topics (use with caution) As related to food and weight anxiety.  As evidenced by recent history of binge eating with sense of being out of control.    Intervention:  Nutrition counseling.  Monitoring/Evaluation:  Dietary intake, exercise, and body weight in 3 week(s).
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
## 2012-11-02 ENCOUNTER — Encounter: Payer: Self-pay | Admitting: Family Medicine

## 2012-11-02 ENCOUNTER — Ambulatory Visit (INDEPENDENT_AMBULATORY_CARE_PROVIDER_SITE_OTHER): Payer: 59 | Admitting: Family Medicine

## 2012-11-02 VITALS — BP 112/72 | HR 85 | Resp 16 | Ht 65.0 in | Wt 232.0 lb

## 2012-11-02 DIAGNOSIS — N760 Acute vaginitis: Secondary | ICD-10-CM | POA: Insufficient documentation

## 2012-11-02 DIAGNOSIS — G8929 Other chronic pain: Secondary | ICD-10-CM

## 2012-11-02 DIAGNOSIS — E669 Obesity, unspecified: Secondary | ICD-10-CM

## 2012-11-02 DIAGNOSIS — E282 Polycystic ovarian syndrome: Secondary | ICD-10-CM

## 2012-11-02 DIAGNOSIS — R7309 Other abnormal glucose: Secondary | ICD-10-CM | POA: Diagnosis not present

## 2012-11-02 DIAGNOSIS — Z139 Encounter for screening, unspecified: Secondary | ICD-10-CM | POA: Diagnosis not present

## 2012-11-02 DIAGNOSIS — F333 Major depressive disorder, recurrent, severe with psychotic symptoms: Secondary | ICD-10-CM

## 2012-11-02 DIAGNOSIS — R51 Headache: Secondary | ICD-10-CM | POA: Diagnosis not present

## 2012-11-02 DIAGNOSIS — G4700 Insomnia, unspecified: Secondary | ICD-10-CM

## 2012-11-02 DIAGNOSIS — R519 Headache, unspecified: Secondary | ICD-10-CM

## 2012-11-02 LAB — COMPREHENSIVE METABOLIC PANEL
Alkaline Phosphatase: 70 U/L (ref 39–117)
BUN: 19 mg/dL (ref 6–23)
Creat: 1.04 mg/dL (ref 0.50–1.10)
Glucose, Bld: 80 mg/dL (ref 70–99)
Total Bilirubin: 0.3 mg/dL (ref 0.3–1.2)

## 2012-11-02 MED ORDER — FLUCONAZOLE 150 MG PO TABS: ORAL_TABLET | ORAL | Status: DC

## 2012-11-02 NOTE — Patient Instructions (Addendum)
F/u in 4.5 month, call if you need me before   Fluconazole is prescribed as discussed   Labs today, HBA1C and CMP  These will be sent to Dr Talmage Nap the endocrinologist in [place]  Pls commit to daily exercise, not excessive, approximating the equivalent of 3 miles per day, this will cause a 1 pound weight loss weekly  Healthy weight loss is no more than 2 pounds per week   We will provide handout on sleep hygiene and insomnia, no pharmaceutical management is  The best way to go fior everyone, if possible  Insomnia Insomnia means you have trouble falling or staying asleep. It affects about one person in three at different times and is usually related to stress from work, school, or personal relations. Insomnia is also a sign of depression or anxiety. Other medical problems that cause insomnia include conditions that cause pain, night leg cramps, coughing, shortness of breath, urinary problems, and fevers. Sleep apnea is an abnormal breathing pattern at night that can cause insomnia and loud snoring. Certain medications and excess intake of caffeine drinks (coffee, tea, colas) can also interfere with normal sleep. Treatment for insomnia depends on the cause. Besides specific medical treatment, the following measures can help you relax and get better sleep. Get regular exercise every day, at least several hours before bed time. Try to get to bed at the same time every night. Take a hot bath before retiring to help you relax. Do not stay in bed if you are unable to sleep. During the daytime avoid staying in bed to watch television, eat, or read. Reduce unwanted noise and light in your room. Keep your room at a comfortable temperature. Avoid alcohol as it causes one to sleep less soundly, may cause you to awaken during the night, and can leave you feeling groggy the next day. Using a mild sedative prescribed or suggested by your caregiver may be needed, but the daily use of sleeping pills is not  recommended. Anti-depressant medicines can improve sleep in people with depression. Please call your doctor for follow up care to better understand the cause and proper treatment of your insomnia. Document Released: 05/22/2004 Document Revised: 07/07/2011 Document Reviewed: 04/14/2005 [facility] Patient Information 2014 [place], [place].

## 2012-11-04 ENCOUNTER — Ambulatory Visit (INDEPENDENT_AMBULATORY_CARE_PROVIDER_SITE_OTHER): Payer: 59 | Admitting: Psychiatry

## 2012-11-04 DIAGNOSIS — F333 Major depressive disorder, recurrent, severe with psychotic symptoms: Secondary | ICD-10-CM

## 2012-11-04 NOTE — Progress Notes (Signed)
   THERAPIST PROGRESS NOTE  Session Time: 3:30-4:30 pm  Participation Level: Active  Behavioral Response: CasualAlertAnxious  Type of Therapy: Individual Therapy  Treatment Goals addressed: Anxiety  Interventions: DBT and Supportive  Summary: Katelyn [name] is a 37 y.o. female who presents with high anxious mood and affect. Pt reports high anxiety since her last session three weeks ago and indicated a need to resume more frequent therapy sessions. Pt  Was aware of stopping her anxiety maintanance activities and quickly identified she was in an anxiety relapse and what skills she needed to resume. Pt acknowledged that there was a time in therapy when she could not identify any skills and appeared proud of her progress in therapy. Pt said she is using her DBT skill of distress tolerance "if you can fix it fix it" and identified in the session other activities she can stop that are coming up because they will trigger her anxiety needlessly. Pt said she is still working with [facility] on her eating disorder and said it is helping. Pt reports low depression.   Suicidal/Homicidal: Nowithout intent/plan  Therapist Response: Assessed overall level of anxiety, identified ways to reduce depression and reminded Pt of DBT skills of Distress tolerance to reduce symptoms.   Plan: Return again in 1 weeks. Pt is on the cancellation list to get in sooner than her next three week appointment per her request.   Diagnosis: Axis I: Depression, Major, Recurrent, Severe with psychosis    Axis II: No diagnosis    Chelsi Warr E, LCSW 11/04/2012

## 2012-11-07 DIAGNOSIS — G4700 Insomnia, unspecified: Secondary | ICD-10-CM | POA: Insufficient documentation

## 2012-11-07 NOTE — Assessment & Plan Note (Signed)
Marked improvement in frequency and d  severity on topamax, continue same, managed by psych

## 2012-11-07 NOTE — Progress Notes (Signed)
  Subjective:    Patient ID: Katelyn [name], female    DOB: [birth year], 37 y.o.   MRN: [id number]  HPI The PT is here for follow up and re-evaluation of chronic medical conditions, medication management and review of any available recent lab and radiology data.  Preventive health is updated, specifically  Cancer screening and Immunization.   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 Not suicidal, homicidal, or hallucinating, current meds help a lot except that insomnia is a real problem The PT denies any adverse reactions to current medications since the last visit.  There are no new concerns.  There are no specific complaints  Concerned about weight, would love to get apetitie suppressant , but due to high pill count of mind/mood altering meds, I believe this may be impossible, will leave psych to further addess    Review of Systems See HPI Denies recent fever or chills. Denies sinus pressure, nasal congestion, ear pain or sore throat. Denies chest congestion, productive cough or wheezing. Denies chest pains, palpitations and leg swelling Denies abdominal pain, nausea, vomiting,diarrhea or constipation.   Denies dysuria, frequency, hesitancy or incontinence. Denies joint pain, swelling and limitation in mobility. Denies seizures, numbness, or tingling. . Denies skin break down or rash.        Objective:   Physical Exam  Patient alert and oriented and in no cardiopulmonary distress.  HEENT: No facial asymmetry, EOMI, no sinus tenderness,  oropharynx pink and moist.  Neck supple no adenopathy.  Chest: Clear to auscultation bilaterally.  CVS: S1, S2 no murmurs,  no S3.  ABD: Soft non tender. Bowel sounds normal.  Ext: No edema  MS: Adequate ROM spine, shoulders, hips and knees.  Skin: Intact, no ulcerations or rash noted.  Psych: Good eye contact, blunted  affect. Memory intact not anxious or depressed appearing.  CNS: CN 2-12 intact, power, tone and sensation normal throughout.       Assessment & Plan:

## 2012-11-07 NOTE — Assessment & Plan Note (Signed)
Severe, however on multiple mind altering meds currently Non pharmacological methods discussed in detail

## 2012-11-07 NOTE — Assessment & Plan Note (Signed)
Deteriorated. Patient re-educated about  the importance of commitment to a  minimum of 150 minutes of exercise per week. The importance of healthy food choices with portion control discussed. Encouraged to start a food diary, count calories and to consider  joining a support group. Sample diet sheets offered. Goals set by the patient for the next several months.   Currently seeing a specialist in eating disorders to help with management of eating d/o Discussed that safety of phentermine is questionable and she needs to have psych determine. Emphasized healthy weigh loss goals

## 2012-11-07 NOTE — Assessment & Plan Note (Signed)
Hospitalized since last visit. 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

## 2012-11-07 NOTE — Assessment & Plan Note (Signed)
Maintained on metformin, of note blood sugar is totally normal

## 2012-11-07 NOTE — Assessment & Plan Note (Signed)
H/o recurrent vaginal yeast infections, script sent in fluconazole

## 2012-11-11 ENCOUNTER — Encounter (HOSPITAL_COMMUNITY): Payer: Self-pay | Admitting: *Deleted

## 2012-11-11 ENCOUNTER — Ambulatory Visit (HOSPITAL_COMMUNITY)
Admission: RE | Admit: 2012-11-11 | Discharge: 2012-11-11 | Disposition: A | Discharge status: Home / Self Care | Payer: 59 | Attending: Psychiatry | Admitting: Psychiatry
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
## 2012-11-12 ENCOUNTER — Encounter (HOSPITAL_COMMUNITY): Payer: Self-pay

## 2012-11-12 ENCOUNTER — Other Ambulatory Visit (HOSPITAL_COMMUNITY): Payer: Medicare Other | Attending: Psychiatry | Admitting: Psychiatry

## 2012-11-12 DIAGNOSIS — F332 Major depressive disorder, recurrent severe without psychotic features: Secondary | ICD-10-CM | POA: Diagnosis not present

## 2012-11-12 DIAGNOSIS — F410 Panic disorder [episodic paroxysmal anxiety] without agoraphobia: Secondary | ICD-10-CM | POA: Diagnosis not present

## 2012-11-12 DIAGNOSIS — F909 Attention-deficit hyperactivity disorder, unspecified type: Secondary | ICD-10-CM | POA: Insufficient documentation

## 2012-11-12 DIAGNOSIS — F4310 Post-traumatic stress disorder, unspecified: Secondary | ICD-10-CM | POA: Diagnosis not present

## 2012-11-12 DIAGNOSIS — F988 Other specified behavioral and emotional disorders with onset usually occurring in childhood and adolescence: Secondary | ICD-10-CM

## 2012-11-12 DIAGNOSIS — F323 Major depressive disorder, single episode, severe with psychotic features: Secondary | ICD-10-CM

## 2012-11-12 DIAGNOSIS — F333 Major depressive disorder, recurrent, severe with psychotic symptoms: Secondary | ICD-10-CM

## 2012-11-12 MED ORDER — FLUOXETINE HCL 20 MG PO CAPS: 60.0000 mg | ORAL_CAPSULE | Freq: Every day | ORAL | Status: DC

## 2012-11-12 MED ORDER — LISDEXAMFETAMINE DIMESYLATE 20 MG PO CAPS: 20.0000 mg | ORAL_CAPSULE | ORAL | Status: DC

## 2012-11-12 NOTE — Progress Notes (Signed)
Patient ID: Katelyn [name], female   DOB: 06/08/1975, 37 y.o.   MRN: [id number] D:  This is an 37 y.o married, caucasian female, who was referred per therapist Maxcine Ham, LCSW), treatment for increased anxiety and depression. Pt admits to SI, no plan or intent.  Discussed safety options with pt.  Pt able to contract for safety.  Denies HI and V/hallucinations.  Admits to A/ hallucinations.  "Whenever I am at home the phone is ringing (when it's not really ringing), I hear the TV (when it's not really on), and I hear voices from the refrigerator."  States the voice from the refrigerator tells her "no."  Pt reports that she has significant issues with binge eating and reports that she binged on food last night but did not throw up. Pt reports that she contacted her eating disorder therapist yesterday Risk analyst, LCSW) and has an appointment today. Pt reports that her anxiety has increasingly gotten worse over the past month. Pt reports that her eating disorder therapist recommended that she volunteer in the community to help her with her anxiety. Pt reports that she was volunteering at a summer food program for children. Pt reports that she became very anxious and overwhelmed volunteering and reports that her symptoms exacerbated.  Pt reports increased daily panic attacks over the past month and feels that she is a "failure" and can't work to get past her experience and reports negative self talk. Pt reports that she is not eating or sleeping and just wants to lay in bed all day, over the past week since her children (37 yo son and a 49 yo daughter) are with her mother to give her a break.  States that her husband of 14 yrs marriage, has a new job, therefore he is away from the home a lot.  Pt also mentioned, that a 83 yo neighbor passed this past Wednesday.  According to pt, her neighbor had be decompensating since she fell and broke her hip. Pt states she feels "alone with no one to talk to who  understands." Pt denies any drugs/ETOH.  Has been on disability ~ two months. *Pt is well known to this writer due to previous admits in [facility]-I[facility].  CC: previous records for history Pt completed all forms.  Scored 38 on the burns.  Pt will attend MH-IOP for 12 days.  A:  Re-oriented pt.  Informed Dr. Lolly Mustache and Maxcine Ham, LCSW of admit.  Provided pt with an orientation folder.  Have pt to sign a release for [facility], LCSW.  Encouraged support groups.  R:  Pt receptive.

## 2012-11-15 ENCOUNTER — Other Ambulatory Visit (HOSPITAL_COMMUNITY): Payer: Medicare Other | Admitting: Psychiatry

## 2012-11-15 DIAGNOSIS — F333 Major depressive disorder, recurrent, severe with psychotic symptoms: Secondary | ICD-10-CM

## 2012-11-15 DIAGNOSIS — F410 Panic disorder [episodic paroxysmal anxiety] without agoraphobia: Secondary | ICD-10-CM | POA: Diagnosis not present

## 2012-11-15 DIAGNOSIS — F332 Major depressive disorder, recurrent severe without psychotic features: Secondary | ICD-10-CM | POA: Diagnosis not present

## 2012-11-15 DIAGNOSIS — F909 Attention-deficit hyperactivity disorder, unspecified type: Secondary | ICD-10-CM | POA: Diagnosis not present

## 2012-11-15 DIAGNOSIS — F4310 Post-traumatic stress disorder, unspecified: Secondary | ICD-10-CM | POA: Diagnosis not present

## 2012-11-15 NOTE — Progress Notes (Signed)
    Daily Group Progress Note  Program: IOP  Group Time: 9:00-10:30 am   Participation Level: Active  Behavioral Response: Appropriate  Type of Therapy:  Process Group  Summary of Progress: Pt shared with the group her reason for joining and said "I need to be around people who understand depression and anxiety". Pt talked about her difficult with change and with feeling a need to please others. She said she feels anxious and depressed when others are "dissapointed" in her. Pt talked about how her husband taking a new job and buying a new truck this weekend has been a lot of change to adjust to and how she needed the support of the group to help her maintain stability. This is the first time Pt asked to join the group and Pt is getting much better at saying what she needs to ensure her wellness.      Group Time: 10:30 am - 12:00 pm   Participation Level:  Active  Behavioral Response: Appropriate  Type of Therapy: Psycho-education Group  Summary of Progress: Pt participated in a group with a focus on grief and loss and identified current losses impacting wellness and effective ways to grieve them.   Carman Ching, LCSW

## 2012-11-15 NOTE — Progress Notes (Signed)
    Daily Group Progress Note  Program: IOP  Group Time: 9:00-10:30 am   Participation Level: None  Behavioral Response: none  Type of Therapy:  Initial Assessment and Orientation  Summary of Progress: Pt was completing the initial assessment and orientation due to starting the program today.      Group Time: 10:30 am - 12:00 pm   Participation Level:  Active  Behavioral Response: Appropriate  Type of Therapy: Psycho-education Group  Summary of Progress:  Pt participated in a guided meditation to reduce stress and discussed how the relaxation technique was beneficial in reducing the patients feelings of stress.  Carman Ching, LCSW

## 2012-11-16 ENCOUNTER — Other Ambulatory Visit (HOSPITAL_COMMUNITY): Payer: Medicare Other | Admitting: Psychiatry

## 2012-11-16 DIAGNOSIS — F333 Major depressive disorder, recurrent, severe with psychotic symptoms: Secondary | ICD-10-CM

## 2012-11-16 DIAGNOSIS — F410 Panic disorder [episodic paroxysmal anxiety] without agoraphobia: Secondary | ICD-10-CM | POA: Diagnosis not present

## 2012-11-16 DIAGNOSIS — F332 Major depressive disorder, recurrent severe without psychotic features: Secondary | ICD-10-CM | POA: Diagnosis not present

## 2012-11-16 DIAGNOSIS — F4310 Post-traumatic stress disorder, unspecified: Secondary | ICD-10-CM | POA: Diagnosis not present

## 2012-11-16 DIAGNOSIS — F909 Attention-deficit hyperactivity disorder, unspecified type: Secondary | ICD-10-CM | POA: Diagnosis not present

## 2012-11-16 NOTE — Progress Notes (Signed)
    Daily Group Progress Note  Program: IOP  Group Time: 9:00-10:30 am   Participation Level: Active  Behavioral Response: Appropriate  Type of Therapy:  Process Group  Summary of Progress: pt described an increase in her depression and suicidal thoughts without a plan due to her neighbor, Katelyn [name], saying she is disappointed in Pt for getting mental health support. Group challenged Pt on being in a co-dependent relationship that is a trigger for depression symptoms and Pt said she knows this and needs to explore setting better boundaries with the friendship and learning to be ok with what the consequences of doing that will be. Pt is getting better at sharing voluntarily and opening up more in a group setting.      Group Time: 10:30 am - 12:00 pm   Participation Level:  Active  Behavioral Response: Appropriate  Type of Therapy: Psycho-education Group  Summary of Progress:  Pt was introduced to the topic of how to set healthy boundaries to ensure optimal wellness.   Carman Ching, LCSW

## 2012-11-17 ENCOUNTER — Other Ambulatory Visit (HOSPITAL_COMMUNITY): Payer: Medicare Other | Admitting: Psychiatry

## 2012-11-17 ENCOUNTER — Encounter (HOSPITAL_COMMUNITY): Payer: Self-pay | Admitting: Psychiatry

## 2012-11-17 DIAGNOSIS — F4310 Post-traumatic stress disorder, unspecified: Secondary | ICD-10-CM | POA: Diagnosis not present

## 2012-11-17 DIAGNOSIS — F332 Major depressive disorder, recurrent severe without psychotic features: Secondary | ICD-10-CM | POA: Diagnosis not present

## 2012-11-17 DIAGNOSIS — F333 Major depressive disorder, recurrent, severe with psychotic symptoms: Secondary | ICD-10-CM

## 2012-11-17 DIAGNOSIS — F410 Panic disorder [episodic paroxysmal anxiety] without agoraphobia: Secondary | ICD-10-CM | POA: Diagnosis not present

## 2012-11-17 DIAGNOSIS — F909 Attention-deficit hyperactivity disorder, unspecified type: Secondary | ICD-10-CM | POA: Diagnosis not present

## 2012-11-17 NOTE — Progress Notes (Signed)
    Daily Group Progress Note  Program: IOP  Group Time: 9:00-10:30 am   Participation Level: Active  Behavioral Response: Appropriate  Type of Therapy:  Process Group  Summary of Progress: Pt reports high depression again today associated with feeling like she is disappointing her neighbor. Pt is learning how to feel confident in herself and set limits with her neighbor who is unhealthy for pts overall wellbeing. Pt is talking more and volunteering to share, which is a huge accomplishment.      Group Time: 10:30 am - 12:00 pm   Participation Level:  Active  Behavioral Response: Appropriate  Type of Therapy: Psycho-education Group  Summary of Progress: pt participated in part II of a Boundaries discussion and identified personal things that need boundary setting to ensure wellness.  Carman Ching, LCSW
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
## 2012-11-18 ENCOUNTER — Other Ambulatory Visit (HOSPITAL_COMMUNITY): Payer: Medicare Other | Admitting: Psychiatry

## 2012-11-18 DIAGNOSIS — F909 Attention-deficit hyperactivity disorder, unspecified type: Secondary | ICD-10-CM | POA: Diagnosis not present

## 2012-11-18 DIAGNOSIS — F410 Panic disorder [episodic paroxysmal anxiety] without agoraphobia: Secondary | ICD-10-CM | POA: Diagnosis not present

## 2012-11-18 DIAGNOSIS — F332 Major depressive disorder, recurrent severe without psychotic features: Secondary | ICD-10-CM | POA: Diagnosis not present

## 2012-11-18 DIAGNOSIS — F333 Major depressive disorder, recurrent, severe with psychotic symptoms: Secondary | ICD-10-CM

## 2012-11-18 DIAGNOSIS — F4310 Post-traumatic stress disorder, unspecified: Secondary | ICD-10-CM | POA: Diagnosis not present

## 2012-11-18 NOTE — Progress Notes (Signed)
    Daily Group Progress Note  Program: IOP  Group Time: 9:00-10:30 am   Participation Level: Active  Behavioral Response: Appropriate  Type of Therapy:  Process Group  Summary of Progress: Pt is so much more assertive and talkative this time in the IOP program than previous times. Pt talked about her struggle with being co-dependent in relationships and said this is not her biggest stressor. Her main focus is feeling "alone" with her husband being at work all day and not feeling like she has anything to do. Pt wants to find ways to fill her day and feel less alone. Pt expressed some paranoia and said she thinks her neighbor can see through the computer or her cell phone and watch Pts behaviors.      Group Time: 10:30 am - 12:00 pm   Participation Level:  Active  Behavioral Response: Appropriate  Type of Therapy: Psycho-education Group  Summary of Progress: Pt learned the five ways to set boundaries with others (words, time, distance, people, and consequences) and how to use them to increase wellness.  Carman Ching, LCSW

## 2012-11-19 ENCOUNTER — Other Ambulatory Visit (HOSPITAL_COMMUNITY): Payer: Medicare Other | Admitting: Psychiatry

## 2012-11-19 DIAGNOSIS — F4310 Post-traumatic stress disorder, unspecified: Secondary | ICD-10-CM | POA: Diagnosis not present

## 2012-11-19 DIAGNOSIS — F332 Major depressive disorder, recurrent severe without psychotic features: Secondary | ICD-10-CM | POA: Diagnosis not present

## 2012-11-19 DIAGNOSIS — F333 Major depressive disorder, recurrent, severe with psychotic symptoms: Secondary | ICD-10-CM

## 2012-11-19 DIAGNOSIS — F410 Panic disorder [episodic paroxysmal anxiety] without agoraphobia: Secondary | ICD-10-CM | POA: Diagnosis not present

## 2012-11-19 DIAGNOSIS — F909 Attention-deficit hyperactivity disorder, unspecified type: Secondary | ICD-10-CM | POA: Diagnosis not present

## 2012-11-19 NOTE — Progress Notes (Signed)
    Daily Group Progress Note  Program: IOP  Group Time: 9:00-10:30 am   Participation Level: Active  Behavioral Response: Appropriate  Type of Therapy:  Process Group  Summary of Progress: Pt reports a significant improvement in her mood and said she wishes she could feel like this forever. She said she feels more anxiety but it is a productive form of anxiety where she feels she can accomplish things she otherwise can't. Members tried to help PT identify the cause of improved mood. Pt said she feels excited to see her children today who are coming home from the beach and slept well last night, but Pt feels her mood is just shifting on its own.      Group Time: 10:30 am - 12:00 pm   Participation Level:  Active  Behavioral Response: Appropriate  Type of Therapy: Psycho-education Group  Summary of Progress: Pt learned about self-esteem and the causes of low self-esteem and how to begin to improve it.   Carman Ching, LCSW

## 2012-11-19 NOTE — Progress Notes (Signed)
Patient ID: Katelyn [name], female   DOB: 1975-10-20, 37 y.o.   MRN: [id number]  Subjective: [name] requested to be seen today because she was thinking yesterday she wanted to discontinue the Topamax. She reports that she sees no appetite suppression benefit from the Topamax, and does not see the need to take it. She also reports that she did not start the Vyvanse or increased the dose of Prozac as we had talked about in her initial visit. She had talked with her outpatient psychiatrist who was concerned about side effects from the Prozac. She admits to some anxiety about taking a new medication, such as Vyvanse. She reports that her mood has improved significantly, and she states that her depression seems to have lifted somewhat. She endorses a degree of anxiety, but states that it is a good anxiety in that she feels she can be more productive. She expresses some concern that she has not been able to lose any further weight, and in fact may have gained some. She continues to work with a nutritionist , as well as a Paramedic on her eating habits. She is also participating in cardiovascular exercise 6 days per week, one hour daily.  Objective: Well-nourished well-developed, albeit obese, white female who is fully alert and oriented and in no acute distress. She presents with a depressed and anxious mood with a congruent affect. Her speech is clear and coherent had a regular rate and rhythm and decreased volume. Her cognitive functioning is within normal limits. She shows no outward sign of psychosis. Her insight and judgment are fair  Assessment and Plan: We will continue all of her medications that she is currently taking them, that being Wellbutrin XL 150 mg daily, Klonopin 1 mg twice daily as needed, Prozac 40 mg daily, Topamax 25 mg each morning and 50 mg in the evening, and Geodon 40 mg each morning and 80 mg each evening. She is encouraged to continue to eat a healthy diet, and try to mix up her workout  routine to avoid muscle memory.

## 2012-11-22 ENCOUNTER — Other Ambulatory Visit (HOSPITAL_COMMUNITY): Payer: Medicare Other

## 2012-11-22 NOTE — Progress Notes (Signed)
    Daily Group Progress Note  Program: IOP  Group Time: 9:00-10:30 a.m.  Participation Level: Active  Behavioral Response: Appropriate  Type of Therapy:  Group Therapy  Summary of Progress: Patient discussed her frustration with her father wanting her to see another psychiatrist for a second opinion.  States she has been trying to set boundaries with her parents but is having difficulty because she feels that they are doing a lot of things for her.     Group Time: 10:45-12n  Participation Level:  Active  Behavioral Response: Sharing  Type of Therapy: Psycho-education Group  Summary of Progress: Patient participated in a group with a focus on grief and loss and identified current losses impacting wellness and effective ways to grieve them.  [facility]

## 2012-11-23 ENCOUNTER — Ambulatory Visit: Payer: Self-pay | Admitting: Family Medicine

## 2012-11-24 ENCOUNTER — Other Ambulatory Visit (HOSPITAL_COMMUNITY): Payer: Medicare Other | Admitting: Psychiatry

## 2012-11-24 DIAGNOSIS — F909 Attention-deficit hyperactivity disorder, unspecified type: Secondary | ICD-10-CM | POA: Diagnosis not present

## 2012-11-24 DIAGNOSIS — F4310 Post-traumatic stress disorder, unspecified: Secondary | ICD-10-CM | POA: Diagnosis not present

## 2012-11-24 DIAGNOSIS — F333 Major depressive disorder, recurrent, severe with psychotic symptoms: Secondary | ICD-10-CM

## 2012-11-24 DIAGNOSIS — F332 Major depressive disorder, recurrent severe without psychotic features: Secondary | ICD-10-CM | POA: Diagnosis not present

## 2012-11-24 DIAGNOSIS — F410 Panic disorder [episodic paroxysmal anxiety] without agoraphobia: Secondary | ICD-10-CM | POA: Diagnosis not present

## 2012-11-24 NOTE — Progress Notes (Signed)
    Daily Group Progress Note  Program: IOP  Group Time: 9:00-10:30 am   Participation Level: Active  Behavioral Response: Appropriate  Type of Therapy:  Process Group  Summary of Progress: Pt reports improved mood and continues to be more talkative in the group. Pt is exploring the unhealthiness of her relationship with her neighbor, [name], and her tendency to have co-dependent relationships. Pt is also looking at her tendency towards "all or nothing" thinking" and how she tends to get disappointed when things don't go "perfectly" when doing things with her children. Pt gave the example of how she would like to spend more time with her kids, but when she does not have the energy to cook a full dinner and gives them a TV dinner instead she feels like a failure and then does not enjoy the time with them.      Group Time: 10:30 am - 12:00 pm   Participation Level:  Active  Behavioral Response: Appropriate  Type of Therapy: Psycho-education Group  Summary of Progress: pt was educated on negative self talk and how this contributes to feelings of depression and low self-esteem. Pt then participated in activity to help challenge negative thoughts about self.   Carman Ching, LCSW

## 2012-11-25 ENCOUNTER — Other Ambulatory Visit (HOSPITAL_COMMUNITY): Payer: Medicare Other | Admitting: Psychiatry

## 2012-11-25 DIAGNOSIS — F410 Panic disorder [episodic paroxysmal anxiety] without agoraphobia: Secondary | ICD-10-CM | POA: Diagnosis not present

## 2012-11-25 DIAGNOSIS — F332 Major depressive disorder, recurrent severe without psychotic features: Secondary | ICD-10-CM | POA: Diagnosis not present

## 2012-11-25 DIAGNOSIS — F909 Attention-deficit hyperactivity disorder, unspecified type: Secondary | ICD-10-CM | POA: Diagnosis not present

## 2012-11-25 DIAGNOSIS — F333 Major depressive disorder, recurrent, severe with psychotic symptoms: Secondary | ICD-10-CM

## 2012-11-25 DIAGNOSIS — F4310 Post-traumatic stress disorder, unspecified: Secondary | ICD-10-CM | POA: Diagnosis not present

## 2012-11-26 ENCOUNTER — Ambulatory Visit (HOSPITAL_COMMUNITY): Payer: Self-pay | Admitting: Psychiatry

## 2012-11-26 ENCOUNTER — Other Ambulatory Visit (HOSPITAL_COMMUNITY): Payer: 59 | Attending: Psychiatry | Admitting: Psychiatry

## 2012-11-26 DIAGNOSIS — F332 Major depressive disorder, recurrent severe without psychotic features: Secondary | ICD-10-CM | POA: Diagnosis not present

## 2012-11-26 DIAGNOSIS — F909 Attention-deficit hyperactivity disorder, unspecified type: Secondary | ICD-10-CM | POA: Insufficient documentation

## 2012-11-26 DIAGNOSIS — F4310 Post-traumatic stress disorder, unspecified: Secondary | ICD-10-CM | POA: Insufficient documentation

## 2012-11-26 DIAGNOSIS — F410 Panic disorder [episodic paroxysmal anxiety] without agoraphobia: Secondary | ICD-10-CM | POA: Diagnosis not present

## 2012-11-26 DIAGNOSIS — F333 Major depressive disorder, recurrent, severe with psychotic symptoms: Secondary | ICD-10-CM

## 2012-11-26 NOTE — Progress Notes (Signed)
    Daily Group Progress Note  Program: IOP  Group Time: 9:00-10:30 am   Participation Level: Active  Behavioral Response: Appropriate  Type of Therapy:  Process Group  Summary of Progress: Pt is scheduled to end the IOP program today and appears to be at her baseline level of functioning. Pt is less depressed and has anxiety at a manageable level, per Pt self report. Pt is exploring the unhealthy components of her friendship with her neighbor and considering how it is a trigger for depression symptoms. Pt still gets anxious about any changes to scheduling or things that occur that are outside of the normal. Pt is also exploring how her time alone at home is a trigger for her depression.      Group Time: 10:30 am - 12:00 pm   Participation Level:  Active  Behavioral Response: Appropriate  Type of Therapy: Psycho-education Group  Summary of Progress: Pt participated in and educational segment and discussion on how to plan to end the program and what would need to be put in place to ensure continued wellness.   Carman Ching, LCSW

## 2012-11-26 NOTE — Progress Notes (Signed)
Patient ID: Katelyn [name], female   DOB: [birth year], 37 y.o.   MRN: [id number] D: This is an 37 y.o married, caucasian female, who was referred per therapist Maxcine Ham, LCSW), treatment for increased anxiety and depression. Pt denies SI, HI, or Visual Hallucinations. Admits to continued A/ hallucinations. "Whenever I am at home the phone is ringing (when it's not really ringing), I hear the TV (when it's not really on), and I hear voices from the refrigerator." States the voice from the refrigerator tells her "no." Reports that although she feels better overall, she continues to struggle with anxiety and poor concentration.   States the groups are always helpful.  Pt voiced that she would like to continue working on structuring her days and to attend a support group on Wednesdays.  Pt states she would like a second gynecological opinion.  "I feel that I start feeling very depressed and suicidal only whenever it's time for my menstrual cycle.  A:  D/C today.  Encouraged pt to discuss second opinion with her therapist and psychiatrist.  F/U with Maxcine Ham, LCSW on 12-07-12 and Dr. Lolly Mustache on 12-08-12.  Encouraged support groups.  R:  Pt receptive.

## 2012-11-26 NOTE — Progress Notes (Signed)
    Daily Group Progress Note  Program: IOP  Group Time: 9:00-10:30 am   Participation Level: Active  Behavioral Response: Appropriate  Type of Therapy:  Process Group  Summary of Progress: Today is Pts last day in the IOP program. She appears to be at her baseline level of functioning. She reports stable mood and has elevated affect and appears more confident and assertive. Pt is exploring ways she can change her daily schedule to get her more active and reduce triggers for depression. Pt also is looking at her co-dependent tendencies in her relationships and how to be more independent instead of seeking approval or her value from others.      Group Time: 10:30 am - 12:00 pm   Participation Level:  Active  Behavioral Response: Appropriate  Type of Therapy: Psycho-education Group  Summary of Progress: Pt participated in a goodbye ceremony for herself and another member leaving the group today and also learned about how to use technology through smart phone applications to manage mental health and wellness.   Carman Ching, LCSW

## 2012-11-26 NOTE — Patient Instructions (Signed)
Patient completed MH-IOP today.  Will follow up with Maxcine Ham, LCSW on 12-07-12 @ 1:30 pm and Dr. Lolly Mustache on 12-08-12 @ 4:45 pm.  Encouraged support groups.

## 2012-11-29 ENCOUNTER — Ambulatory Visit (HOSPITAL_COMMUNITY): Payer: Self-pay | Admitting: Psychiatry

## 2012-11-29 ENCOUNTER — Other Ambulatory Visit (HOSPITAL_COMMUNITY): Payer: 59

## 2012-11-29 ENCOUNTER — Other Ambulatory Visit (HOSPITAL_COMMUNITY): Payer: Self-pay | Admitting: Psychiatry

## 2012-11-29 DIAGNOSIS — F329 Major depressive disorder, single episode, unspecified: Secondary | ICD-10-CM

## 2012-11-29 MED ORDER — ZIPRASIDONE HCL 40 MG PO CAPS: 40.0000 mg | ORAL_CAPSULE | ORAL | Status: DC

## 2012-11-29 MED ORDER — ZIPRASIDONE HCL 80 MG PO CAPS: 80.0000 mg | ORAL_CAPSULE | Freq: Every day | ORAL | Status: DC

## 2012-11-29 NOTE — Telephone Encounter (Signed)
Received refill request for Ziprasidone 80 mg at HS. Clarified w/MD:Pt takes Geodon 40 mg in AM and 80 mg at HS. Clarified w/Pharmacy:RX split into 2 prescriptions-40 mg capsules AM and 80 mg capsules HS.40 mg refilled 7/24 w/no refils remaining.

## 2012-11-30 ENCOUNTER — Other Ambulatory Visit (HOSPITAL_COMMUNITY): Payer: 59

## 2012-12-01 ENCOUNTER — Other Ambulatory Visit (HOSPITAL_COMMUNITY): Payer: 59

## 2012-12-02 ENCOUNTER — Other Ambulatory Visit (HOSPITAL_COMMUNITY): Payer: 59

## 2012-12-06 ENCOUNTER — Ambulatory Visit (INDEPENDENT_AMBULATORY_CARE_PROVIDER_SITE_OTHER): Payer: 59 | Admitting: Psychiatry

## 2012-12-06 ENCOUNTER — Encounter (HOSPITAL_COMMUNITY): Payer: Self-pay | Admitting: Psychiatry

## 2012-12-06 VITALS — BP 122/86 | HR 80 | Ht 65.0 in | Wt 244.0 lb

## 2012-12-06 DIAGNOSIS — F329 Major depressive disorder, single episode, unspecified: Secondary | ICD-10-CM | POA: Diagnosis not present

## 2012-12-06 DIAGNOSIS — F29 Unspecified psychosis not due to a substance or known physiological condition: Secondary | ICD-10-CM

## 2012-12-06 MED ORDER — ZIPRASIDONE HCL 40 MG PO CAPS: 40.0000 mg | ORAL_CAPSULE | ORAL | Status: DC

## 2012-12-06 MED ORDER — BUPROPION HCL ER (XL) 150 MG PO TB24: ORAL_TABLET | ORAL | Status: DC

## 2012-12-06 MED ORDER — FLUOXETINE HCL 40 MG PO CAPS: 40.0000 mg | ORAL_CAPSULE | Freq: Every day | ORAL | Status: DC

## 2012-12-06 MED ORDER — CLONAZEPAM 1 MG PO TABS: 1.0000 mg | ORAL_TABLET | Freq: Two times a day (BID) | ORAL | Status: DC | PRN

## 2012-12-06 MED ORDER — ZIPRASIDONE HCL 80 MG PO CAPS
80.0000 mg | ORAL_CAPSULE | Freq: Every day | ORAL | Status: DC
Start: 2012-12-06 — End: 2012-12-30
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
## 2012-12-07 ENCOUNTER — Ambulatory Visit (INDEPENDENT_AMBULATORY_CARE_PROVIDER_SITE_OTHER): Payer: Medicare Other | Admitting: Psychiatry

## 2012-12-07 DIAGNOSIS — F333 Major depressive disorder, recurrent, severe with psychotic symptoms: Secondary | ICD-10-CM

## 2012-12-07 NOTE — Progress Notes (Signed)
   THERAPIST PROGRESS NOTE  Session Time: 1:30-2:30 pm  Participation Level: Active  Behavioral Response: CasualAlertDepressed  Type of Therapy: Individual Therapy  Treatment Goals addressed: Coping  Interventions: CBT and Solution Focused  Summary: Katelyn [name] is a 37 y.o. female who presents with mild depressed mood and affect but appears to be at her baseline level of functioning. Pt states she was anxious last week at the beach with her family, but pushed through the anxiety in an attempt to make fun memories with her children. Pt believes pushing herself may have caused some mild depression symptoms this week, but said she is managing and able to function. Pt processed themes of anxiety around her children starting school in two weeks and how to plan to reduce anxiety symptoms. Pt identified ways to plan snacks and meals more effectively to reduce her stress and looked at schedule changes to allow good time for play and homework.   Suicidal/Homicidal: Nowithout intent/plan  Therapist Response: Assessed overall level of depression per pt self report. Used CBT to explore behavior changes to reduce stress associated with upcoming start of school. Explored themes of all or nothing thinking and how this impacts her behavior and ways to challenge it.   Plan: Return again in 1 weeks. Follow up with preparing for kids to start school. Planning five snacks and five meals to put into a rotation.   Diagnosis: Axis I: Depression Major, Recurrent Severe    Axis II: No diagnosis    Carman Ching, LCSW 12/07/2012

## 2012-12-08 ENCOUNTER — Ambulatory Visit (HOSPITAL_COMMUNITY): Payer: Self-pay | Admitting: Psychiatry

## 2012-12-09 DIAGNOSIS — N943 Premenstrual tension syndrome: Secondary | ICD-10-CM | POA: Diagnosis not present

## 2012-12-13 ENCOUNTER — Ambulatory Visit (INDEPENDENT_AMBULATORY_CARE_PROVIDER_SITE_OTHER): Payer: Managed Care, Other (non HMO) | Admitting: Psychiatry

## 2012-12-13 ENCOUNTER — Encounter: Payer: Self-pay | Admitting: Family Medicine

## 2012-12-13 ENCOUNTER — Ambulatory Visit (INDEPENDENT_AMBULATORY_CARE_PROVIDER_SITE_OTHER): Payer: 59 | Admitting: Family Medicine

## 2012-12-13 VITALS — Ht 66.0 in | Wt 248.6 lb

## 2012-12-13 DIAGNOSIS — F333 Major depressive disorder, recurrent, severe with psychotic symptoms: Secondary | ICD-10-CM

## 2012-12-13 DIAGNOSIS — F509 Eating disorder, unspecified: Secondary | ICD-10-CM | POA: Diagnosis not present
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
## 2012-12-13 NOTE — Progress Notes (Signed)
Medical Nutrition Therapy:  Appt start time: 1000 end time:  1100.  Assessment:  Primary concerns today: Weight management and eating disorder.  Arieona said she is eating breakfast daily and she feels good about the choices she is making for breakfast.  She often feels overwhelmed by the variety of food choices for the rest of the day, however.  She feels frustrated that she cannot maintain a consistent eating pattern, but does well for a while, then starts getting into the pattern of bingeing and restricting.  She is still seeing therapist Noni Saupe, and spent some time at [facility] last month when her depression got severe.  She will be starting on OCA to help regulate hormones, which are believed to be contributing to depression.    Progress Towards Goal(s):  In progress.   Nutritional Diagnosis:   No progress yet on NB-1.2 Harmful beliefs/attitudes about food or nutrition-related topics (use with caution) As related to food and weight anxiety.  As evidenced by recent history of binge eating with sense of being out of control.    Intervention:  Nutrition counseling.  Monitoring/Evaluation:  Dietary intake, exercise, and body weight in 4 week(s).

## 2012-12-13 NOTE — Patient Instructions (Addendum)
-   Lunch options with a sandwich wrap:    - side salad; raw veg's (carrots, celery, cucumber, pepper strips), cooked veg's, soup (to which you add frozen veg's).  - Dinner options:  Think about using the template of veg's, protein, and carb.  - Advance planning & preparation:       - Protein:  Keep some chicken in the freezer, tuna fish and beans in the cupboard, some uncooked burgers in freezer, cottage cheese, plain [place] yogurt to   top salads or soups.       - Veg's:  Keep frozen on hand.  Fresh veg's that keep well include carrots, cabbage (microwave or cut for slaw).     - Carb's:  Wraps, rice, quinoa (look for recipes on internet to keep on hand), sweet potatoes, beets (roasted; look for recipes), other bread/bakery products.  - Choose 3 from each above category, and keep these stocked in your kitchen.   Considerations:  - There is no perfect diet.    - "Less than ideal" food choices are a part of every diet.   - Food decisions algorithm provided today:  Use as needed.    - Hungry  - Angry  - Anxious  - Lonely  - Tired   (& Bored / Depressed)  - SATISFACTION is a crucial part of a healthy diet.

## 2012-12-18 NOTE — Progress Notes (Addendum)
Patient ID: ALTON TREMBLAY, female   DOB: [birth year], 37 y.o.   MRN: [id number]  Discharge Note  Patient:  Katelyn [name] DOB:  January 25, 1976  Date of Admission: 11/12/2012 Date of Discharge: 11/26/2012  Reason for Admission: anxiety and depression  Hospital course Lashonna was referred by her outpatient psychiatrist, Dr. Kathryne Sharper, for the intensive outpatient treatment for her increased anxiety and depression. She was continued on her outpatient medications including Wellbutrin XL 150 mg daily, Klonopin 1 mg twice daily as needed, Prozac 40 mg daily,  Topamax 25 mg each morning and 50 mg each evening, and Geodon 40 mg each morning and 80 mg each evening. She joined the group and quickly became an active participant. On approximately day 4 she expressed that her depression seemed to be increasing, and she expressed some suicidal thoughts. She continued to process her feelings in the group setting, and made excellent progress. At her discharge visit she reported that she was feeling more stable. She stated that she was taking time to focus on using the tools that she learned in the group. She feels that her medications are working well. She denied any suicidal or homicidal ideation. She denied any auditory or visual hallucinations.  Mental status at discharge alert and oriented and in no acute distress, mildly dysphoric and anxious with a congruent affect, good eye contact, cognitive functioning within normal limits, insight and judgment fair, speech is clear and coherent at a regular rate and rhythm and normal volume, memory and concentration are intact, no outward signs of suicidality or psychosis.  Lab Results: No results found for this or any previous visit (from the past 48 hour(s)).   Discharge prescriptions: no prescriptions were provided at discharge, but her discharge medications are as follows:  Wellbutrin XL 150 mg daily  Klonopin 1 mg twice daily as needed  Prozac 40 mg daily  Topamax  25 mg each morning and 50 mg each evening  Geodon 40 mg each morning and 80 mg each evening  Axis Diagnosis:  Axis I: ADHD, inattentive type, Major Depression, Recurrent severe, Panic Disorder and Post Traumatic Stress Disorder  Axis II: deferred Axis III:  Obesity    .  Skin lesion      Excisional biopsy   .  Depression      several suicide attempts, hospitaluzed in 2012 for this   .  Dizziness - light-headed    .  Polycystic ovary    .  Anxiety    .  Chronic headaches    .  IBS (irritable bowel syndrome)    .  History of pneumonia      x 3   .  Benign juvenile melanoma    .  Colon polyps      found on colonoscopy 04/26/2012   .  Sleep apnea      has lost 100lbs.   Axis IV: moderate for economic problems, occupational problems and problems related to social environment  Axis V: 60  Level of Care: OP  Discharge destination: Home  Is patient on multiple antipsychotic therapies at discharge: No  Has Patient had three or more failed trials of antipsychotic monotherapy by history: No  Follow-up recommendations: Activity: As tolerated  Diet: Regular   Follow-up appointments:   Maxcine Ham, LCSW on 12/07/2012     Kathryne Sharper, psychiatrist on 12/08/2012 Comments:    The patient received suicide prevention pamphlet: Yes  Yolande Jolly, MHS, PA-C

## 2012-12-20 DIAGNOSIS — N643 Galactorrhea not associated with childbirth: Secondary | ICD-10-CM | POA: Diagnosis not present

## 2012-12-20 DIAGNOSIS — E282 Polycystic ovarian syndrome: Secondary | ICD-10-CM | POA: Diagnosis not present

## 2012-12-20 DIAGNOSIS — E119 Type 2 diabetes mellitus without complications: Secondary | ICD-10-CM | POA: Diagnosis not present

## 2012-12-20 DIAGNOSIS — E669 Obesity, unspecified: Secondary | ICD-10-CM | POA: Diagnosis not present

## 2012-12-30 ENCOUNTER — Other Ambulatory Visit (HOSPITAL_COMMUNITY): Payer: Self-pay | Admitting: *Deleted

## 2012-12-30 ENCOUNTER — Ambulatory Visit (HOSPITAL_COMMUNITY): Payer: Self-pay | Admitting: Psychiatry

## 2012-12-30 DIAGNOSIS — F329 Major depressive disorder, single episode, unspecified: Secondary | ICD-10-CM

## 2012-12-30 MED ORDER — ZIPRASIDONE HCL 40 MG PO CAPS: ORAL_CAPSULE | ORAL | Status: DC

## 2013-01-03 ENCOUNTER — Ambulatory Visit (INDEPENDENT_AMBULATORY_CARE_PROVIDER_SITE_OTHER): Payer: 59 | Admitting: Family Medicine

## 2013-01-03 ENCOUNTER — Encounter: Payer: Self-pay | Admitting: Family Medicine

## 2013-01-03 VITALS — Wt 255.0 lb

## 2013-01-03 DIAGNOSIS — F509 Eating disorder, unspecified: Secondary | ICD-10-CM | POA: Diagnosis not present

## 2013-01-03 NOTE — Patient Instructions (Addendum)
-   Email your oral contraceptive to [email or web address]. - Advance planning and preparation will help you be successful in your food choices.   - Remember there is no all or none; If you need a quick, easy meal, you can always add a fruit and/or vegetable.   - Remember the rule of 3 components for a meal.  - Start a food record along with you son (for his Scout project), writing down what, how much, and what time you eat.    - Bring to follow-up appt.  - Read handout on sleep hygiene, and incorporate some of the suggestions as possible.    - GOALS:  - Daily food record, including any physical activity.   - Limit carb's to 2 portions (30 grams) per meal and 1 (15 grams) per snack.  (This is limited to starchy foods; fruit and milk excluded.)

## 2013-01-03 NOTE — Progress Notes (Signed)
Medical Nutrition Therapy:  Appt start time: 1100 end time:  1000.  Assessment:  Primary concerns today: Weight management and eating disorder.  Ayaka has been more depressed and anxious since last appt; OCA has not improved these sx, but she has only been taking it for ~3 wks.  Consequently, food choices have tended toward convenience and fast foods.  Her endocrinologist Dr. Talmage Nap considers her A1C of 6.1 to be diagnostic of DM b/c of Margalit's family hx of DM, so has recommended limiting carb's to 30 g per meal.   Reeshemah has not been sleeping well; wakes ~4 am most days, and cannot get back to sleep.  Does not go to sleep till ~midnight most nights.      24-hr recall:  (UP at 4 AM) B (5 AM)-  diet soda, can't remember bkfst Snk (11 AM)-  8 mini-donuts L ( PM)-  none Snk (2 PM)-  1/2  sausage & chs dip, 20 corn chips, diet soda D (6:30 PM)-  1 slc meatloaf, 1 c mashed pot's, 2 dinner rolls, diet soda Snk (8 PM)-  1 1/2 c ice cream This morning had a protein shake for breakfast, then sausage & chs dip & chips ~9 AM.  When asked what might help her make consistently better choices, Bailyn said she has been more successful before when keeping food records.    Progress Towards Goal(s):  In progress.   Nutritional Diagnosis:   No progress yet on NB-1.2 Harmful beliefs/attitudes about food or nutrition-related topics (use with caution) As related to food and weight anxiety.  As evidenced by recent history of binge eating with sense of being out of control.    Intervention:  Nutrition counseling.  Monitoring/Evaluation:  Dietary intake, exercise, and body weight in 4 week(s).

## 2013-01-17 ENCOUNTER — Encounter (HOSPITAL_COMMUNITY): Payer: Self-pay | Admitting: Psychiatry

## 2013-01-17 ENCOUNTER — Ambulatory Visit (INDEPENDENT_AMBULATORY_CARE_PROVIDER_SITE_OTHER): Payer: Medicare Other | Admitting: Psychiatry

## 2013-01-17 ENCOUNTER — Ambulatory Visit (HOSPITAL_COMMUNITY): Payer: Self-pay | Admitting: Psychiatry

## 2013-01-17 VITALS — BP 112/83 | HR 73 | Ht 65.0 in | Wt 256.2 lb

## 2013-01-17 DIAGNOSIS — F329 Major depressive disorder, single episode, unspecified: Secondary | ICD-10-CM

## 2013-01-17 DIAGNOSIS — F29 Unspecified psychosis not due to a substance or known physiological condition: Secondary | ICD-10-CM | POA: Diagnosis not present

## 2013-01-17 MED ORDER — CLONAZEPAM 1 MG PO TABS: 1.0000 mg | ORAL_TABLET | ORAL | Status: DC | PRN

## 2013-01-17 MED ORDER — BUPROPION HCL ER (XL) 150 MG PO TB24: ORAL_TABLET | ORAL | Status: DC

## 2013-01-17 MED ORDER — FLUOXETINE HCL 40 MG PO CAPS: 40.0000 mg | ORAL_CAPSULE | Freq: Every day | ORAL | Status: DC

## 2013-01-17 MED ORDER — ZIPRASIDONE HCL 40 MG PO CAPS: ORAL_CAPSULE | ORAL | Status: DC
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
## 2013-01-18 DIAGNOSIS — Z23 Encounter for immunization: Secondary | ICD-10-CM | POA: Diagnosis not present

## 2013-01-21 ENCOUNTER — Ambulatory Visit (INDEPENDENT_AMBULATORY_CARE_PROVIDER_SITE_OTHER): Payer: Medicare Other | Admitting: Psychiatry

## 2013-01-21 DIAGNOSIS — F333 Major depressive disorder, recurrent, severe with psychotic symptoms: Secondary | ICD-10-CM | POA: Diagnosis not present

## 2013-01-21 NOTE — Progress Notes (Signed)
   THERAPIST PROGRESS NOTE  Session Time: 1:30-2:30 pm  Participation Level: Active  Behavioral Response: CasualAlertAnxious  Type of Therapy: Individual Therapy  Treatment Goals addressed: Anxiety  Interventions: CBT  Summary: Katelyn [name] is a 37 y.o. female who presents with high anxious mood and affect. It has been five weeks since Pts last visit due to Pt falling off a scheduling routine. Pt said she has been struggling with high anxiety over the past several weeks associated with the kids starting school and Pt finding a routine with all the activities they are involved in. Pt processed her routine and ways to reduce stress. Pt also processed her friendship with her controlling friend/nieghbor and how Pt wants to set better boundaries with her regarding not exercising daily and having her control Pts weightloss. Pt agreed to stop seeing her nutritionist and weight counselor due to feeling like Pt has accepted her wieght and does not want to make that the focus of treatment.   Suicidal/Homicidal: Nowithout intent/plan  Therapist Response: Assessed overall level of depression and anxiety and explored triggers to anxiety with the kids school starting. Explored ways to manage the routine to reduce stress, ways to set boundaries with friend and ways to increase daily self care time to reduce stress.   Plan: Return again in 2 weeks.  Diagnosis: Axis I: Depression Major Recurrent Severe and Anxiety NOS    Axis II: No diagnosis    Dareth Andrew E, LCSW 01/21/2013

## 2013-01-28 ENCOUNTER — Ambulatory Visit (HOSPITAL_COMMUNITY): Payer: Self-pay | Admitting: Psychiatry

## 2013-01-31 ENCOUNTER — Ambulatory Visit: Payer: Self-pay | Admitting: Family Medicine

## 2013-02-04 ENCOUNTER — Ambulatory Visit (INDEPENDENT_AMBULATORY_CARE_PROVIDER_SITE_OTHER): Payer: Medicare Other | Admitting: Psychiatry

## 2013-02-04 DIAGNOSIS — F333 Major depressive disorder, recurrent, severe with psychotic symptoms: Secondary | ICD-10-CM

## 2013-02-04 NOTE — Progress Notes (Signed)
   THERAPIST PROGRESS NOTE  Session Time: 2:30-3:30 pm  Participation Level: Active  Behavioral Response: CasualAlertDepressed  Type of Therapy: Individual Therapy  Treatment Goals addressed: Coping  Interventions: Solution Focused and Other: WRAP Skills  Summary: Katelyn [name] is a 37 y.o. female who presents with severe depressed mood and flat affect. Pt said she is "extremely anxious", but then described her change in behaviors that indicated severe depression 8/9 symptoms. Pt denied thoughts of suicide, but said she is "very comfortable isolating at home in her bed". Pt said her children are asking her if she is going back to the hospital and noticing a change in Pts behaviors. Pt said it feels so good to "not be anxious" that it is hard to challenge it. Pt started overeating, oversleeping, not cooking dinner, staying in the bed during the day, not getting dressed or styling her hair and reports low motivation and energy. The anxiety comes from when she "has to leave her home". Paranoia about medications is returning and she is starting to question if she needs to take them, although states that she has not stopped them. Pt agreed she needs to intervene and made a plan to see if she could go away this weekend with her husband for a chance of scenery and to get her out of her habits in her home. Pt is going to ask her mother to watch the children and agreed if symptoms worsen she will go into the hospital and Higher education careers adviser.     Suicidal/Homicidal: Nowithout intent/plan  Therapist Response: Assessed increase in depression symptoms. Reviewed possible triggers, reviewed WRAP skills to determine events that need to be scheduled back in and made plans for Pt to go away this weekend with her husband.   Plan: Return again in 1 weeks. Pt agrees to enact crisis plan if symptoms worsen.  Diagnosis: Axis I: Depression Major Recurent Severe with Psychosis    Axis II: No  diagnosis    Camry Robello E, LCSW 02/04/2013

## 2013-02-11 ENCOUNTER — Ambulatory Visit (INDEPENDENT_AMBULATORY_CARE_PROVIDER_SITE_OTHER): Payer: Medicare Other | Admitting: Psychiatry

## 2013-02-11 DIAGNOSIS — F333 Major depressive disorder, recurrent, severe with psychotic symptoms: Secondary | ICD-10-CM | POA: Diagnosis not present

## 2013-02-11 NOTE — Progress Notes (Signed)
   THERAPIST PROGRESS NOTE  Session Time: 2:30-3:30 pm  Participation Level: Active  Behavioral Response: CasualConfusedDepressed  Type of Therapy: Individual Therapy  Treatment Goals addressed: Coping  Interventions: CBT and Solution Focused  Summary: Katelyn [name] is a 37 y.o. female who presents with severe depressed mood with paranoid thinking. Pts depression symptoms have worsened since last week with increase insomnia and paranoid thinking. Pt has 8-9 depression symptoms but denies thoughts of sucide. Pt does not want to go into the hospital and is questioning taking her mental health medications. Pt said she is not showering regualry and staying in the bed for most of the day. Pt said she has plans to go to [place] with her husband this weekend for a getaway trip to see if it helps reduce her depression. Pt was against the idea of hospitalization and ECT treatments. Pt has a scheduled appointment with Dr. Lolly Mustache on Monday and agreed to inform him about the increase in depression symptoms. Pt agreed to track her mood daily on a scoring sheet provided today during the session to monitor symptoms.    Suicidal/Homicidal: Nowithout intent/plan  Therapist Response: Assessed increase in depression symptoms and assessed for safety. Made a plan to assure safety and encouraged daily mood tracking to monitor symptoms.   Plan: Return again in 1 weeks.  Diagnosis: Axis I: Depression Major Recurrent Severe with psychosis    Axis II: No diagnosis    Katelyn Iseman E, LCSW 02/11/2013

## 2013-02-14 ENCOUNTER — Encounter (HOSPITAL_COMMUNITY): Payer: Self-pay | Admitting: Psychiatry

## 2013-02-14 ENCOUNTER — Ambulatory Visit (INDEPENDENT_AMBULATORY_CARE_PROVIDER_SITE_OTHER): Payer: Medicare Other | Admitting: Psychiatry

## 2013-02-14 VITALS — BP 125/68 | HR 84 | Ht 65.0 in | Wt 264.0 lb

## 2013-02-14 DIAGNOSIS — F29 Unspecified psychosis not due to a substance or known physiological condition: Secondary | ICD-10-CM

## 2013-02-14 DIAGNOSIS — F329 Major depressive disorder, single episode, unspecified: Secondary | ICD-10-CM

## 2013-02-14 MED ORDER — ZIPRASIDONE HCL 40 MG PO CAPS: ORAL_CAPSULE | ORAL | Status: DC

## 2013-02-14 MED ORDER — BUPROPION HCL ER (XL) 150 MG PO TB24: ORAL_TABLET | ORAL | Status: DC

## 2013-02-14 MED ORDER — CLONAZEPAM 1 MG PO TABS: 1.0000 mg | ORAL_TABLET | ORAL | Status: DC | PRN

## 2013-02-14 MED ORDER — FLUOXETINE HCL 40 MG PO CAPS: 40.0000 mg | ORAL_CAPSULE | Freq: Every day | ORAL | Status: DC
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
## 2013-02-24 ENCOUNTER — Ambulatory Visit (INDEPENDENT_AMBULATORY_CARE_PROVIDER_SITE_OTHER): Payer: Medicare Other | Admitting: Psychiatry

## 2013-02-24 ENCOUNTER — Encounter (HOSPITAL_COMMUNITY): Payer: Self-pay

## 2013-02-24 DIAGNOSIS — F333 Major depressive disorder, recurrent, severe with psychotic symptoms: Secondary | ICD-10-CM

## 2013-02-24 NOTE — Progress Notes (Signed)
   THERAPIST PROGRESS NOTE  Session Time: 2:30-3:30 pm  Participation Level: Active  Behavioral Response: CasualAlertDepressed  Type of Therapy: Individual Therapy  Treatment Goals addressed: Coping  Interventions: CBT, DBT and Solution Focused  Summary: Katelyn [name] is a 37 y.o. female who presents with moderate depressed mood and affect with auditory halucinations. Pt was fearful of her cell phone and wanted to turn it off during the session so she would not hear "the children laughing". Pt said she hears the tv when it is off at home, hears doorbells ringing but is paranoid about taking more medications right now and did not want to schedule a follow up with Dr. Celene Kras. Pt wants to try to see if the symptoms lesson. Pt said she did go away last weekend to [place] with her husband and found it restful. Pt said her depression has lessoned, but she still feels confused, is having difficulty making decisions and feels "blah".    Suicidal/Homicidal: Nowithout intent/plan  Therapist Response: Assessed overall level of depression, per Pt self report. Assessed reduction in depression symptoms and reviewed coping skills to manage symptoms. Explored increase in parania and discussed a plan to manage them.   Plan: Return again in 2 weeks.  Diagnosis: Axis I: Bipolar Disorder    Axis II: No diagnosis    Latona Krichbaum E, LCSW 02/24/2013

## 2013-03-02 ENCOUNTER — Telehealth (HOSPITAL_COMMUNITY): Payer: Self-pay | Admitting: Psychiatry

## 2013-03-02 ENCOUNTER — Telehealth (HOSPITAL_COMMUNITY): Payer: Self-pay | Admitting: *Deleted

## 2013-03-02 NOTE — Telephone Encounter (Signed)
I returned patient's phone call.  She is complaining of poor sleep.  She don't understand why she having insomnia.  She tried trazodone but limited response.  I recommend to take Klonopin 2 mg and if that does not help her sleep than she should take Klonopin 2 mg with trazodone 150 mg.  Recommend to cause back if she does not feel any improvement.  Patient denied any suicidal thoughts.

## 2013-03-02 NOTE — Telephone Encounter (Signed)
Pt left VM:Has not been sleeping well for several weeks.Sleeps about 2 hrs/night.Exhausted,headaches & eyes hurt.Has taken Geodon at bedtime as directed and also tried Trazodone, but neither helped.Requests call from provider and/or medicine to help sleep.

## 2013-03-03 ENCOUNTER — Other Ambulatory Visit: Payer: Self-pay

## 2013-03-08 ENCOUNTER — Telehealth: Payer: Self-pay | Admitting: Family Medicine

## 2013-03-08 ENCOUNTER — Ambulatory Visit (INDEPENDENT_AMBULATORY_CARE_PROVIDER_SITE_OTHER): Payer: Medicare Other | Admitting: Psychiatry

## 2013-03-08 ENCOUNTER — Telehealth: Payer: Self-pay

## 2013-03-08 ENCOUNTER — Telehealth: Payer: Self-pay | Admitting: *Deleted

## 2013-03-08 ENCOUNTER — Other Ambulatory Visit: Payer: Self-pay

## 2013-03-08 ENCOUNTER — Encounter (HOSPITAL_COMMUNITY): Payer: Self-pay | Admitting: Psychiatry

## 2013-03-08 VITALS — BP 116/83 | HR 84 | Wt 265.0 lb

## 2013-03-08 DIAGNOSIS — F29 Unspecified psychosis not due to a substance or known physiological condition: Secondary | ICD-10-CM

## 2013-03-08 DIAGNOSIS — F329 Major depressive disorder, single episode, unspecified: Secondary | ICD-10-CM | POA: Diagnosis not present

## 2013-03-08 DIAGNOSIS — R195 Other fecal abnormalities: Secondary | ICD-10-CM

## 2013-03-08 DIAGNOSIS — G4700 Insomnia, unspecified: Secondary | ICD-10-CM

## 2013-03-08 DIAGNOSIS — R112 Nausea with vomiting, unspecified: Secondary | ICD-10-CM

## 2013-03-08 MED ORDER — BUTALBITAL-APAP-CAFFEINE 50-500-40 MG PO TABS
1.0000 | ORAL_TABLET | Freq: Three times a day (TID) | ORAL | Status: DC | PRN
Start: 2013-03-08 — End: 2013-03-29

## 2013-03-08 MED ORDER — ONDANSETRON HCL 4 MG PO TABS: 4.0000 mg | ORAL_TABLET | Freq: Every day | ORAL | Status: DC | PRN

## 2013-03-08 MED ORDER — TEMAZEPAM 15 MG PO CAPS: 15.0000 mg | ORAL_CAPSULE | Freq: Every evening | ORAL | Status: DC | PRN

## 2013-03-08 MED ORDER — DIPHENOXYLATE-ATROPINE 2.5-0.025 MG PO TABS: 1.0000 | ORAL_TABLET | Freq: Four times a day (QID) | ORAL | Status: DC | PRN

## 2013-03-08 MED ORDER — BUTALBITAL-APAP-CAFFEINE 50-325-40 MG PO TABS: 1.0000 | ORAL_TABLET | Freq: Three times a day (TID) | ORAL | Status: DC | PRN

## 2013-03-08 NOTE — Telephone Encounter (Signed)
Spoke with patient and she states that she has previously call her psychiatrist about the insomnia.  And she was given an increase in her dose of klonopin and started on trazodone.  Both of which have not helped.  Given the advise for the loose stools and she is accepting of this.  Encouraged to get back in contact with psych and let them know that meds are not working.  She will do this and call back if they are unable to help her today.

## 2013-03-08 NOTE — Telephone Encounter (Signed)
pls encourage pt to call her mental health provider as far as sleep issues are concerned. Due to lack of sleep I believe this is causing the headache. Loose stool advise OTC immodium I need feedback pls

## 2013-03-08 NOTE — Telephone Encounter (Signed)
Dr Lolly Mustache called in requesting help for pt with 10 plus headache, no localized neurologic complaints, no blood pressure available. Also c/o nausea and loose stool. Advised needs neurology to help with headache management, in interim will prescribe fioricet, lomotil and zofran, limited amt.  Pt was sitting in his office seeing him during the conversation.  Please send in fioricet one tablet every 8 hours as needed , for uncontrrolled headache #20 refill zero  Zofran 4mg  one daily as needed, for nausea #15 no refill  Lomotil i every 6 hours as needed for diarrheah  She needs chem 7 to ensure not dehydrated and pottassium is not low.  Let her know if headache worsens or feels weak, needs to go to the [facility]  Also let her know i will refer her to neurologist of her choice re headache. Dr Lolly Mustache is working on her sleep and psych meds

## 2013-03-08 NOTE — Telephone Encounter (Signed)
Patient aware and meds sent.   Aware of labs.    Has no preference for neuro

## 2013-03-08 NOTE — Addendum Note (Signed)
Addended by: Kandis Fantasia B on: 03/08/2013 03:20 PM   Modules accepted: Orders

## 2013-03-08 NOTE — Telephone Encounter (Signed)
Noted, thanks!
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
## 2013-03-08 NOTE — Telephone Encounter (Signed)
Toni Amend spoke with patient regarding this message

## 2013-03-08 NOTE — Telephone Encounter (Signed)
Pt called stating her RX has not been called in, pt says she is supposed to have 3 RX. Please advise [phone]

## 2013-03-10 ENCOUNTER — Other Ambulatory Visit: Payer: Self-pay | Admitting: Family Medicine

## 2013-03-10 ENCOUNTER — Telehealth: Payer: Self-pay | Admitting: Family Medicine

## 2013-03-10 ENCOUNTER — Ambulatory Visit (INDEPENDENT_AMBULATORY_CARE_PROVIDER_SITE_OTHER): Payer: Medicare Other | Admitting: Psychiatry

## 2013-03-10 DIAGNOSIS — F333 Major depressive disorder, recurrent, severe with psychotic symptoms: Secondary | ICD-10-CM

## 2013-03-10 DIAGNOSIS — R519 Headache, unspecified: Secondary | ICD-10-CM

## 2013-03-10 NOTE — Telephone Encounter (Signed)
Spoke with pt states she still has diarheah , only has had the zofran, advised her to collect the lomotil which was faxed in later that day Sleep has improved with med change and so has her headache, still agrees to neurology appt which is entered

## 2013-03-10 NOTE — Progress Notes (Signed)
   THERAPIST PROGRESS NOTE  Session Time: 2:30-3:20 pm  Participation Level: Active  Behavioral Response: DisheveledDrowsyDepressed  Type of Therapy: Individual Therapy  Treatment Goals addressed: Coping  Interventions: CBT and Solution Focused  Summary: Katelyn [name] is a 37 y.o. female who presents with severe depressed mood, flat affect, confused thought process. Pts hygiene is poor and Pt said she has not been taking care of ADL's, and has "been laying in the bed all week". Pt reports all 9 symptoms of depression including thoughts of suicide, but can contract for safety and does not have a plan. Pt did say that she "overtook" her diabetes medication last week in an attempt to have a medical problem that would put her in the hospital. Pt could not remember how much medication she took. Pt was afraid to tell writer because it would "send her to the hospital". Pt completed a safety assessment and no longer feels the need to harm self. Pt agreed if her depression symptoms remain the same she will bring a packed bag next week and go immediatly inpatient. Pt agreed to call Dr. Lolly Mustache tomorrow if the Risperdal is not helping her sleep. Pt also agreed to enact crisis plan if she becomes a danger to herself.   Suicidal/Homicidal: Nowithout intent/plan  Therapist Response: Assessed overall level of depression, completed suicide and safety assessment, created a plan of action to treat depression and agreed to commit Pt if symptoms of depression don't increase by next week.   Plan: Return again in 1 weeks.  Diagnosis: Axis I: Depression Major Recurrent Severe with psychosis    Axis II: No diagnosis    Alyssa Mancera E, LCSW 03/10/2013

## 2013-03-17 ENCOUNTER — Other Ambulatory Visit (HOSPITAL_COMMUNITY): Payer: Self-pay | Admitting: Psychiatry

## 2013-03-17 ENCOUNTER — Telehealth (HOSPITAL_COMMUNITY): Payer: Self-pay | Admitting: *Deleted

## 2013-03-17 ENCOUNTER — Ambulatory Visit (HOSPITAL_COMMUNITY): Payer: Managed Care, Other (non HMO) | Admitting: Psychiatry

## 2013-03-17 DIAGNOSIS — F329 Major depressive disorder, single episode, unspecified: Secondary | ICD-10-CM

## 2013-03-17 DIAGNOSIS — G4700 Insomnia, unspecified: Secondary | ICD-10-CM

## 2013-03-17 MED ORDER — TEMAZEPAM 30 MG PO CAPS: 30.0000 mg | ORAL_CAPSULE | Freq: Every day | ORAL | Status: DC

## 2013-03-17 NOTE — Progress Notes (Signed)
Patient is taking temazepam 2 capsule.  She needs a new prescription off temazepam 30 mg.  We will provide a new prescription today.

## 2013-03-17 NOTE — Telephone Encounter (Signed)
Called pt to notify that refill she requested is available for pickup today. Pt states she will pick it up when she comes to see her therapist today.

## 2013-03-17 NOTE — Telephone Encounter (Signed)
Pt called asking that her medication Temazepam be refilled because she has been taking 2 pills at bedtime. Per Arfeen's note pt was to take 15mg  1-2 at hs for insomnia. Pt states she will not have enough to last and taking 2 pills is needed. Asking that mediation be changed to 30mg  once at bedtime. Please advise on refill.

## 2013-03-23 ENCOUNTER — Telehealth (HOSPITAL_COMMUNITY): Payer: Self-pay | Admitting: Psychiatry

## 2013-03-23 ENCOUNTER — Telehealth (HOSPITAL_COMMUNITY): Payer: Self-pay

## 2013-03-23 NOTE — Telephone Encounter (Signed)
I returned patient's phone call.  She is complaining about increased anxiety lack of sleep and irrational behavior.  She spending money for no reason.  She admitted she is doing it because of increased anxiety and calm herself.  I recommend to stop the Wellbutrin.  Continue Prozac.  If patient does not feel any better than she should take Geodon 40 mg extra at bedtime.  Recommend because by that she does not see any improvement.  Discussed safety plan.

## 2013-03-28 ENCOUNTER — Ambulatory Visit (INDEPENDENT_AMBULATORY_CARE_PROVIDER_SITE_OTHER): Payer: Medicare Other | Admitting: Psychiatry

## 2013-03-28 DIAGNOSIS — F333 Major depressive disorder, recurrent, severe with psychotic symptoms: Secondary | ICD-10-CM | POA: Diagnosis not present

## 2013-03-28 NOTE — Progress Notes (Signed)
   THERAPIST PROGRESS NOTE  Session Time: 2:30 - 3:20 pm  Participation Level: Active  Behavioral Response: Neat and Well GroomedAlertAnxious and Depressed  Type of Therapy: Individual Therapy  Treatment Goals addressed: Coping  Interventions: CBT, Solution Focused and Supportive  Summary: Katelyn [name] is a 37 y.o. female who presents with less depressed mood with high anxeity. Pt said she feels her mood is starting to improve and she does not feel she needs hospitalization. Pt said she plans on talking to her primary care doctor tomorrow at her appointment and with Dr. Lolly Mustache about how she overtook medications a few weeks ago in during a state of depression. Pt said she was "fearful if she told any of [place] we would stop treating her as a patient". Pt said she still struggles with spending money as a way to manage her anxiety, but her father has control over her funds so she is not worried she will overspend. Pt said she wants to continue learning how to identify triggers of depression and manage it more effectively.    Suicidal/Homicidal: Nowithout intent/plan  Therapist Response: Assessed overall level of depression and anxiety symptoms per Pt self report and behavior during session. Discussed how to share with her providers about her overtaking medications and explored ways to continue managing symptoms.   Plan: Return again in 1 weeks. Got Pt an earlier apt to monitor symptoms of depression and anxiety.   Diagnosis: Axis I: Depression Major Recurrent Severe with psychosis    Axis II: No diagnosis    Lorree Millar E, LCSW 03/28/2013

## 2013-03-29 ENCOUNTER — Encounter: Payer: Self-pay | Admitting: Family Medicine

## 2013-03-29 ENCOUNTER — Telehealth (HOSPITAL_COMMUNITY): Payer: Self-pay | Admitting: Dietician

## 2013-03-29 ENCOUNTER — Ambulatory Visit (INDEPENDENT_AMBULATORY_CARE_PROVIDER_SITE_OTHER): Payer: Medicare Other | Admitting: Family Medicine

## 2013-03-29 VITALS — BP 124/84 | HR 103 | Resp 16 | Ht 65.5 in | Wt 278.0 lb

## 2013-03-29 DIAGNOSIS — F509 Eating disorder, unspecified: Secondary | ICD-10-CM

## 2013-03-29 DIAGNOSIS — F333 Major depressive disorder, recurrent, severe with psychotic symptoms: Secondary | ICD-10-CM

## 2013-03-29 DIAGNOSIS — E282 Polycystic ovarian syndrome: Secondary | ICD-10-CM

## 2013-03-29 DIAGNOSIS — E669 Obesity, unspecified: Secondary | ICD-10-CM

## 2013-03-29 DIAGNOSIS — G8929 Other chronic pain: Secondary | ICD-10-CM

## 2013-03-29 DIAGNOSIS — R51 Headache: Secondary | ICD-10-CM

## 2013-03-29 LAB — COMPLETE METABOLIC PANEL WITH GFR
Albumin: 4 g/dL (ref 3.5–5.2)
Alkaline Phosphatase: 97 U/L (ref 39–117)
BUN: 15 mg/dL (ref 6–23)
Calcium: 8.8 mg/dL (ref 8.4–10.5)
Chloride: 103 mEq/L (ref 96–112)
GFR, Est Non African American: >89 mL/min
Glucose, Bld: 83 mg/dL (ref 70–99)
Potassium: 4.2 mEq/L (ref 3.5–5.3)
Sodium: 139 mEq/L (ref 135–145)
Total Protein: 6.6 g/dL (ref 6.0–8.3)

## 2013-03-29 LAB — CBC
HCT: 39.9 % (ref 36.0–46.0)
Hemoglobin: 13.5 g/dL (ref 12.0–15.0)
MCHC: 33.8 g/dL (ref 30.0–36.0)
MCV: 88.9 fL (ref 78.0–100.0)
RDW: 13.8 % (ref 11.5–15.5)
WBC: 9.9 10*3/uL (ref 4.0–10.5)

## 2013-03-29 NOTE — Telephone Encounter (Signed)
Received referral via fax from Dr. Simpson for dx: obesity.  

## 2013-03-29 NOTE — Patient Instructions (Addendum)
F/u in 1 month, call if you need me before   You are being referred to nutrtionist at [facility] , she will call with an appointment   It is important that you exercise regularly at least 30 minutes 5 times a week. If you develop chest pain, have severe difficulty breathing, or feel very tired, stop exercising immediately and seek medical attention   Three meals per day, 30 to 45 carbs per meal and 2 snacks 15 gm carb per snack. Try to eat  The 3 n meakls at set times every day, and over a 12 to max of 14 hour period  Transition to water as your drink, aim for 60 ounces every day   Weight loss goal of 5 pounds this month  Labs today, cmp 7 and EGFR, CBC . We will add TSh if needed  If your loose stool continues you will be referred to GI Doc Russella Dar

## 2013-03-29 NOTE — Progress Notes (Signed)
   Subjective:    Patient ID: Katelyn [name], female    DOB: 04/19/1976, 37 y.o.   MRN: [id number]  HPI Pt in for follow up. Recently called in with severe headache and sleep deprivation. She states at that time she intentionally overdosed on metformin, as a cry for help, no intent on suicide.Now has had loose stool since that time and is anxious re  Both kidney and liver function. She has been resisting ECT proposed by her psychiatrist for improvement in her mental illness. She has intentionally with held at times just how badly she is doing. This is because she fears ECT, feels that it not only triggered her headaches but has resulted in significant memory loss. Has longstanding eating disorder with h/o binging and purging also, has gained an excessive amount of weight this year, now going to attempt to get gastric bypass to asist with this, which I believe is a great idea   Review of Systems See HPI Denies recent fever or chills. Denies sinus pressure, nasal congestion, ear pain or sore throat. Denies chest congestion, productive cough or wheezing. Denies chest pains, palpitations and leg swelling Denies abdominal pain, nausea, vomiting,diarrhea or constipation.   Denies dysuria, frequency, hesitancy or incontinence. Denies joint pain, swelling and limitation in mobility.  Denies skin break down or rash.         Objective:   Physical Exam Patient alert and oriented and in no cardiopulmonary distress.  HEENT: No facial asymmetry, EOMI, no sinus tenderness,  oropharynx pink and moist.  Neck supple no adenopathy.  Chest: Clear to auscultation bilaterally.  CVS: S1, S2 no murmurs, no S3.  ABD: Soft non tender. Bowel sounds normal.  Ext: No edema  MS: Adequate ROM spine, shoulders, hips and knees.  Skin: Intact, no ulcerations or rash noted.  Psych: Good eye contact,flat  affect. Memory intact  depressed appearing.  CNS: CN 2-12 intact, power, tone and sensation normal  throughout.        Assessment & Plan:

## 2013-03-30 NOTE — Telephone Encounter (Signed)
Called and left message at 1403.

## 2013-03-31 NOTE — Telephone Encounter (Signed)
Received message left by pt at 1040.

## 2013-03-31 NOTE — Telephone Encounter (Signed)
Called back at 1249. Appointment scheduled for 04/07/13 at 0900.

## 2013-04-07 ENCOUNTER — Encounter (HOSPITAL_COMMUNITY): Payer: Self-pay | Admitting: Dietician

## 2013-04-07 NOTE — Progress Notes (Signed)
Outpatient Initial Nutrition Assessment  Date:04/07/2013   Appt Start Time: 1052  Referring Physician: Dr. Lodema Hong Reason for Visit: obesity  Nutrition Assessment:  Height: 5' 5.5" (166.4 cm)   Weight: 280 lb (127.007 kg)   IBW: 128#  %IBW: 219% UBW: 280#  %UBW: 108% Body mass index is 45.87 kg/(m^2). Meets criteria for extreme obesity class III.  Goal Weight: 270# (pt goal) Weight hx: Wt Readings from Last 50 Encounters:  03/29/13 278 lb (126.1 kg)  03/08/13 265 lb (120.203 kg)  02/14/13 264 lb (119.75 kg)  01/17/13 256 lb 3.2 oz (116.212 kg)  01/03/13 255 lb (115.667 kg)  12/13/12 248 lb 9.6 oz (112.764 kg)  12/06/12 244 lb (110.678 kg)  11/02/12 232 lb (105.235 kg)  11/01/12 237 lb 9.6 oz (107.775 kg)  11/01/12 233 lb 12.8 oz (106.051 kg)  10/12/12 227 lb 12.8 oz (103.329 kg)  09/17/12 229 lb (103.874 kg)  08/05/12 221 lb 4.8 oz (100.381 kg)  07/21/12 218 lb 3.2 oz (98.975 kg)  07/12/12 220 lb 9.6 oz (100.064 kg)  07/01/12 209 lb 1.3 oz (94.838 kg)  06/22/12 216 lb (97.977 kg)  05/21/12 211 lb (95.709 kg)  05/06/12 218 lb (98.884 kg)  04/26/12 221 lb (100.245 kg)  03/22/12 221 lb 4 oz (100.358 kg)  03/05/12 217 lb (98.431 kg)  03/03/12 217 lb (98.431 kg)  02/12/12 224 lb (101.606 kg)  02/05/12 224 lb (101.606 kg)  01/13/12 221 lb (100.245 kg)  12/30/11 224 lb (101.606 kg)  09/25/11 221 lb (100.245 kg)  09/12/11 220 lb (99.791 kg)  08/26/11 230 lb (104.327 kg)  08/07/11 230 lb (104.327 kg)  08/06/11 235 lb 1.9 oz (106.65 kg)  08/05/11 237 lb (107.502 kg)  07/03/11 246 lb (111.585 kg)  07/01/11 244 lb 6.4 oz (110.859 kg)  03/31/11 271 lb 1.3 oz (122.961 kg)  03/25/11 274 lb (124.286 kg)  11/13/10 287 lb 1.9 oz (130.237 kg)  07/01/10 271 lb (122.925 kg)  04/09/10 273 lb (123.832 kg)  03/18/10 270 lb 8 oz (122.698 kg)  01/22/10 261 lb (118.389 kg)  08/28/09 289 lb (131.09 kg)  07/26/09 297 lb (134.718 kg)  06/21/09 304 lb (137.893 kg)  05/17/09 301 lb 12 oz  (136.873 kg)  03/07/09 290 lb 12 oz (131.883 kg)  11/01/08 283 lb 4 oz (128.481 kg)  09/01/08 282 lb 1 oz (127.943 kg)  08/01/08 281 lb 3 oz (127.546 kg)  Noted hx of weight fluctuations over the past 4 years. Pt was able to get weight down into the 220's approximately 1 year ago. Over the past year, there is a trend of progressive weight gain. Noted a 59# (27%) wt gain x 1 year, which is significant.  She reports She lost approximately 100# over 2 years ago and lost the same amount of weight previously, but both times gained the weight back (once due to quitting smoking). She her lowest adult weight was 260# when she got married 14.5 years ago. She reports she has always struggled with obesity.   Estimated nutritional needs:  Kcals/ day: 1900-2000 Protein (grams)/day: 101-127 Fluid (L)/ day: 1.9-2.0  PMH:  Past Medical History  Diagnosis Date  . Obesity   . Skin lesion     Excisional biopsy  . Depression     several suicide attempts, hospitaluzed in 2012 for this  . Dizziness - light-headed   . Polycystic ovary   . Anxiety   . Chronic headaches   . IBS (irritable bowel syndrome)   .  History of pneumonia     x 3  . Benign juvenile melanoma   . Colon polyps     found on colonoscopy 04/26/2012  . Sleep apnea     has lost 100lbs.    Medications:  Current Outpatient Rx  Name  Route  Sig  Dispense  Refill  . diphenoxylate-atropine (LOMOTIL) 2.5-0.025 MG per tablet   Oral   Take 1 tablet by mouth 4 (four) times daily as needed for diarrhea or loose stools.   30 tablet   0   . FLUoxetine (PROZAC) 40 MG capsule   Oral   Take 1 capsule (40 mg total) by mouth daily.   30 capsule   1   . ibuprofen (ADVIL,MOTRIN) 200 MG tablet   Oral   Take 400 mg by mouth every 6 (six) hours as needed for pain.         . metFORMIN (GLUCOPHAGE) 500 MG tablet   Oral   Take 1 tablet (500 mg total) by mouth 2 (two) times daily with a meal.   60 tablet   11   . Multiple Vitamin  (MULTIVITAMIN WITH MINERALS) TABS   Oral   Take 1 tablet by mouth daily.   30 tablet   0   . temazepam (RESTORIL) 30 MG capsule   Oral   Take 1 capsule (30 mg total) by mouth at bedtime.   30 capsule   0     Dose change   . ziprasidone (GEODON) 40 MG capsule      Take ONE capsule (40mg ) daily in the morning and Take TWO capsules (80mg ) daily at bedtime   90 capsule   1     Labs: CMP     Component Value Date/Time   NA 139 03/29/2013 1007   K 4.2 03/29/2013 1007   CL 103 03/29/2013 1007   CO2 29 03/29/2013 1007   GLUCOSE 83 03/29/2013 1007   BUN 15 03/29/2013 1007   CREATININE 0.79 03/29/2013 1007   CREATININE 0.85 05/06/2012 1954   CALCIUM 8.8 03/29/2013 1007   PROT 6.6 03/29/2013 1007   ALBUMIN 4.0 03/29/2013 1007   AST 20 03/29/2013 1007   ALT 24 03/29/2013 1007   ALKPHOS 97 03/29/2013 1007   BILITOT 0.3 03/29/2013 1007   GFRNONAA 87* 05/06/2012 1954   GFRAA >90 05/06/2012 1954    Lipid Panel     Component Value Date/Time   CHOL 146 08/07/2011 1109   TRIG 91 08/07/2011 1109   HDL 37* 08/07/2011 1109   CHOLHDL 3.9 08/07/2011 1109   VLDL 18 08/07/2011 1109   LDLCALC 91 08/07/2011 1109     Lab Results  Component Value Date   HGBA1C 4.8 11/02/2012   HGBA1C 5.2 05/06/2012   HGBA1C 5.1 02/05/2012   Lab Results  Component Value Date   LDLCALC 91 08/07/2011   CREATININE 0.79 03/29/2013     Lifestyle/ social habits: Katelyn [name] resides in [place] with her husband, 5 year old son, and 1 year old daughter. She is a former [employer] fund[employer], but is now on disability due to mental illness. Her parents are very supportive.  Stress level is high. She suffers from mental illness. 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 She also exhibited a lot of negative self-talk and is very discouraged about her weight.  Pt is not physically active, but is contemplating walking for 30 minutes per day and [facility]. She reports she exercised 1 hour daily  with a friend, but stopped last year. She  participated in [place], elliptical, bike, and stair stepper.   Nutrition hx/habits: Katelyn [name] desires weight loss. She reports that she is going through the 6 month documentation process of applying for bariatric surgery, after which she can participate in the screening process for the surgery.  She reports that she has tried everything to lose weight. Efforts for weight loss included Adipex, [place], Atkins, [employer] Watchers, multiple liquid diets, and the 3 Day Cardiac Diet. She reports good weight loss attempts with each, however, she has always had difficulty with maintaining the weight loss. Most recently, she has seen Wyona Almas, RD at [facility], and [facility], an eating disorder specialist in [place]. She reports she stopped these visits due to expense. She is not a candidate for weight loss pills due to psychotropic medications, per her report. She also has hx of binging and purging, but no longer participates in this behavior.  She reports that she struggles most with portion control and frequency in eating. She complains that she is hungry all the time. Diet recall reveals she eats 5 times per day and often chooses low fiber, high carbohydrate items. Another challenge for her is that she suffers from mental illness and she often does not feel like she is able to cook, so diet is high in fast food and convenience foods. She expresses desire to make a change, particularly because her own son is now overweight. She finds sweets very hard to resist, admitting to eating an entire pastry or box of swiss rolls her husband may bring in the house.   Diet recall: 0600: shredded wheat cereal with 1% milk OR bagel with cream cheese, black coffee; 0900: TV dinner OR sandwich, diet Dr. Reino Kent; 1200: leftovers OR TV dinner, diet Dr. Reino Kent; 1400: candy and black coffee; 1500: hamburger helper, peas, diet Dr. Reino Kent; 1800: 2 bagels with cream cheese OR chips OR crackers.   Nutrition  Diagnosis: Involuntary weight gain r/t physical inactivity, excessive enrtgy intake AEB 29% wt gain x 1 year.   Nutrition Intervention: Nutrition rx: 1500 kcal NAS, diabetic diet; limit 1 starch per meal; 3 meals per day; 2 snacks per day; low calorie beverages only; 30 minutes physical activity daily  Education/Counseling Provided: Educated pt on principles of weight management. Discussed principles of energy expenditure and how changes in diet and physical activity affect weight status. Discussed nutritional content of commonly eaten foods and suggested healthier alternatives. Educated pt on plate method and a general, healthful diet that includes low fat dairy, lean meats, whole fruits and vegetables, and whole grains most often. Discussed importance of a healthy diet along with regular physical activity (at least 30 minutes 5 times per week) to achieve weight loss goals. Encouraged slow, moderate weight loss (0.5-2# weight loss per week) and adopting healthy lifestyle changes vs. obtaining a certain body type or weight. Encouraged weighing self weekly at a consistent day and time of choice. Showed pt functionality of MyFitnessPal and encouraged using a food diary to better track caloric intake. Provided AND Nutrition Care Manual's "Weight Loss Tips" and "1500 Calorie Diet" handouts Used TeachBack to assess understanding.   Understanding, Motivation, Ability to Follow Recommendations: Expect fair compliance.  Monitoring and Evaluation: Goals: 1) 0.5-2# weight loss per week; 2) 30 minutes of physical activity daily  Recommendations: 1) For weight loss: 1400-1500 kcals daily; 2) Keep Food Diary; 3) Break physical activity into smaller, more frequent sessions; 4) Make a list  when food shopping  F/U: PRN. Provided RD contact information.   Quamaine Webb A. Mayford Knife, RD, LDN 04/07/2013  Appt EndTime: 4540

## 2013-04-07 NOTE — Assessment & Plan Note (Signed)
Recently had an intentional overdose of metformin she reports due to 3 days of inability to sleep with "no one hearing her complaint". Denies suicidal intent, just believes that it was a "call for help" Still has to make her psychiatrist aware of this, as he has been suggesting ECT which she resists, feels that it has caused memory loss and her headaches to escalate

## 2013-04-07 NOTE — Assessment & Plan Note (Signed)
Treated by endo for this with metformin

## 2013-04-07 NOTE — Assessment & Plan Note (Signed)
Excessive weight gain on past 8 month, h/o eating disorder, now enrolling for bariatric surgery, which will definitely be benficial to her health. This is the first of probably 6 monthly sessions to establish consistent healthy behavior changes to assist with weigh loss

## 2013-04-08 ENCOUNTER — Ambulatory Visit (INDEPENDENT_AMBULATORY_CARE_PROVIDER_SITE_OTHER): Payer: Medicare Other | Admitting: Psychiatry

## 2013-04-08 DIAGNOSIS — F333 Major depressive disorder, recurrent, severe with psychotic symptoms: Secondary | ICD-10-CM
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
## 2013-04-11 ENCOUNTER — Encounter: Payer: Self-pay | Admitting: Neurology

## 2013-04-11 ENCOUNTER — Ambulatory Visit (INDEPENDENT_AMBULATORY_CARE_PROVIDER_SITE_OTHER): Payer: Medicare Other | Admitting: Neurology

## 2013-04-11 VITALS — BP 130/80 | HR 86 | Temp 98.4°F | Ht 65.0 in | Wt 282.9 lb

## 2013-04-11 DIAGNOSIS — R51 Headache: Secondary | ICD-10-CM

## 2013-04-11 DIAGNOSIS — G43009 Migraine without aura, not intractable, without status migrainosus: Secondary | ICD-10-CM | POA: Diagnosis not present

## 2013-04-11 DIAGNOSIS — R5381 Other malaise: Secondary | ICD-10-CM | POA: Diagnosis not present

## 2013-04-11 DIAGNOSIS — R519 Headache, unspecified: Secondary | ICD-10-CM

## 2013-04-11 LAB — TSH: TSH: 0.79 u[IU]/mL (ref 0.35–5.50)

## 2013-04-11 MED ORDER — TOPIRAMATE 25 MG PO TABS: ORAL_TABLET | ORAL | Status: DC

## 2013-04-11 NOTE — Assessment & Plan Note (Signed)
Recently worsened due to sleep deprivation, she feels as though the ECT treatment she had in the past is the major trigger. Has upcoming appointment with neurology to help with headache management

## 2013-04-11 NOTE — Progress Notes (Signed)
Rx called in to pharmacy. 

## 2013-04-11 NOTE — Assessment & Plan Note (Signed)
Worsened, has gained over 45 pounds this year. Now trying to get bypass surgery, which would be beneficial
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
## 2013-04-11 NOTE — Patient Instructions (Addendum)
1.  For chronic daily headaches  Start topamax 25mg  as follows:        AM   PM   Week 1:  25mg     Week 2:  25mg    25mg    Week 3:  50mg    25mg     Week 4:  50mg   50mg  2.  Recommend sleep evaluation and CPAP as needed as patient likely having noctural hypoxemia causing morning headaches 3.  Call the office in 6-month with an update of your headaches 4.  Check blood work today 5.  Return to clinic in 57-months

## 2013-04-12 ENCOUNTER — Ambulatory Visit (INDEPENDENT_AMBULATORY_CARE_PROVIDER_SITE_OTHER): Payer: Medicare Other | Admitting: Psychiatry

## 2013-04-12 DIAGNOSIS — F333 Major depressive disorder, recurrent, severe with psychotic symptoms: Secondary | ICD-10-CM | POA: Diagnosis not present

## 2013-04-12 NOTE — Progress Notes (Signed)
   THERAPIST PROGRESS NOTE  Session Time: 1:30-2:20 pm  Participation Level: Active  Behavioral Response: CasualAlertAnxious and Depressed  Type of Therapy: Individual Therapy  Treatment Goals addressed: Diagnosis: Depression and Anxiety  Interventions: CBT and Solution Focused  Summary: Katelyn [name] is a 37 y.o. female who presents with anxious mood and affect. Pt states her anxiety is higher than her depression this week and she could "not pinpoint a cause". Pt said she feels overall unsafe in the world. Pt rated her safest feelings at home when her husband is not around and high anxiety when he is home. Pt said she feels the most unsafe when she is outside of her house due to "watching the news" and feeling concerned that the world is an unsafe place. Pts overall sense of safety, even when she is at her baseline, is low. Pt said she is trying to manage her anxiety symptoms with coping strategies discussed in therapy. Pt said her relaxation space she was using is not effective anymore due to her bedroom being cluttered with other things. Pt brainstormed a need to turn the guest bedroom into her new relaxation space. Pt also indicated high stress associated with a lack of discipline from her children and lack of respect towards her rules. Pt said she is afraid to discipline them because she is trying to compensate for how tough her husband is on them.  Pt states overall she is in a stable place with her mood and she is scheduled to see Dr. Lolly Mustache this week.   Suicidal/Homicidal: Nowithout intent/plan  Therapist Response: Worked on goals of depression and anxiety, assessed overall levels of depression and anxiety per Pt self report, explored causes of anxiety, assessed feelings of safety, and dicussed adding a new relaxation room to Pts home.   Plan: Return again in 2 weeks.  Diagnosis: Axis I: Depression Major Recurrent Moderate with psychosis    Axis II: No  diagnosis    [name], LCSW 04/12/2013

## 2013-04-13 LAB — METHYLMALONIC ACID, SERUM: Methylmalonic Acid, Quant: 0.11 umol/L (ref <0.40)

## 2013-04-14 ENCOUNTER — Ambulatory Visit (INDEPENDENT_AMBULATORY_CARE_PROVIDER_SITE_OTHER): Payer: Medicare Other | Admitting: Psychiatry

## 2013-04-14 ENCOUNTER — Encounter (HOSPITAL_COMMUNITY): Payer: Self-pay | Admitting: Psychiatry

## 2013-04-14 VITALS — BP 136/92 | HR 92 | Wt 279.0 lb

## 2013-04-14 DIAGNOSIS — F29 Unspecified psychosis not due to a substance or known physiological condition: Secondary | ICD-10-CM | POA: Diagnosis not present

## 2013-04-14 DIAGNOSIS — F329 Major depressive disorder, single episode, unspecified: Secondary | ICD-10-CM

## 2013-04-14 DIAGNOSIS — G4700 Insomnia, unspecified: Secondary | ICD-10-CM

## 2013-04-14 MED ORDER — BUPROPION HCL ER (XL) 150 MG PO TB24: 150.0000 mg | ORAL_TABLET | ORAL | Status: DC

## 2013-04-14 MED ORDER — ZIPRASIDONE HCL 40 MG PO CAPS: ORAL_CAPSULE | ORAL | Status: DC

## 2013-04-14 MED ORDER — FLUOXETINE HCL 40 MG PO CAPS: 40.0000 mg | ORAL_CAPSULE | Freq: Every day | ORAL | Status: DC

## 2013-04-14 MED ORDER — TEMAZEPAM 30 MG PO CAPS: 30.0000 mg | ORAL_CAPSULE | Freq: Every day | ORAL | Status: DC
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
## 2013-04-18 ENCOUNTER — Ambulatory Visit (HOSPITAL_COMMUNITY): Payer: Self-pay | Admitting: Psychiatry

## 2013-04-26 ENCOUNTER — Encounter: Payer: Self-pay | Admitting: Family Medicine

## 2013-04-26 ENCOUNTER — Ambulatory Visit (INDEPENDENT_AMBULATORY_CARE_PROVIDER_SITE_OTHER): Payer: Medicare Other | Admitting: Psychiatry

## 2013-04-26 ENCOUNTER — Ambulatory Visit (INDEPENDENT_AMBULATORY_CARE_PROVIDER_SITE_OTHER): Payer: Medicare Other | Admitting: Family Medicine

## 2013-04-26 VITALS — BP 124/82 | HR 83 | Resp 16 | Ht 65.5 in | Wt 281.4 lb

## 2013-04-26 DIAGNOSIS — F333 Major depressive disorder, recurrent, severe with psychotic symptoms: Secondary | ICD-10-CM

## 2013-04-26 DIAGNOSIS — G8929 Other chronic pain: Secondary | ICD-10-CM

## 2013-04-26 DIAGNOSIS — R51 Headache: Secondary | ICD-10-CM

## 2013-04-26 DIAGNOSIS — E669 Obesity, unspecified: Secondary | ICD-10-CM | POA: Diagnosis not present

## 2013-04-26 DIAGNOSIS — E282 Polycystic ovarian syndrome: Secondary | ICD-10-CM

## 2013-04-26 MED ORDER — PHENTERMINE HCL 37.5 MG PO TABS: 37.5000 mg | ORAL_TABLET | Freq: Every day | ORAL | Status: DC
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
## 2013-04-26 NOTE — Progress Notes (Signed)
   Subjective:    Patient ID: Katelyn [name], female    DOB: Sep 05, 1975, 37 y.o.   MRN: [id number]  HPI Pt has been unable to exercise to the extent she desired due to lateral left knee pain, up to a 10 after walking for10 mins, rests but does complete 30 mins 5 days per week. No specific trauma to area, no interest in any imaging studies or even short term use of prescription pain medication. Eating has not been as hoped during the holiday season, excessive access to sweets and unhealthy snacks which she has been giving in to, plans to change this and get rid of the food which is unhealthy Requests addition of appetite suppressant , hungry all the time, she is an emotional eater with h/o eating d/o Had neurology eval, essentially normal, feeling is that needs sleep eval to see if she has sleep apnea as the culprit for daily headaches, will refer for the test, body habitus is definitely compatible with the dx. Following with psych regualrly, esp therapisty feels a bit etter mentally, but distraught/concerned about her weight    Review of Systems See HPI Denies recent fever or chills. Denies sinus pressure, nasal congestion, ear pain or sore throat. Denies chest congestion, productive cough or wheezing. Denies chest pains, palpitations and leg swelling Denies abdominal pain, nausea, vomiting,diarrhea or constipation.   Denies dysuria, frequency, hesitancy or incontinence.  Denies skin break down or rash.        Objective:   Physical Exam  Patient alert and oriented and in no cardiopulmonary distress.  HEENT: No facial asymmetry, EOMI, no sinus tenderness,  oropharynx pink and moist.  Neck supple no adenopathy.  Chest: Clear to auscultation bilaterally.  CVS: S1, S2 no murmurs, no S3.  ABD: Soft non tender. Bowel sounds normal.  Ext: No edema  MS: Adequate ROM spine, shoulders, hips and reduced in left  Knee which is tender on lateral aspect  Skin: Intact, no ulcerations or  rash noted.  Psych: Good eye contact,flat / blunted affect. Memory intact not anxious but mildly depressed appearing.  CNS: CN 2-12 intact, power, tone and sensation normal throughout.       Assessment & Plan:

## 2013-04-26 NOTE — Patient Instructions (Addendum)
F/u in 4 weeks, call if you need me before  I will start phenetrmine  If Ok with Dr Lolly Mustache, so far computer has not flagged a warning  Get rid of holiday food, and have only veg or fruit available for snacks Keep appts with therapist   Continue nto commit to 30 mins of physical activity at last 5 days per week, hopefully the knee will son nb less painful so exercise will not be hindered/  Weight loss goal of 2 to 4 pounds per month  You are being rferred for evaluation for sleep apnea, as neurologist believes that untreated sleep apnea is contributing to chronic daily headaches

## 2013-05-01 NOTE — Assessment & Plan Note (Signed)
Followed by endo, no med change, recent lab review reveals elevated testosterone level, pt unaware of this previously. Thyuroid function is normal, as is her HBa1C

## 2013-05-01 NOTE — Assessment & Plan Note (Signed)
Deteriorated. Patient re-educated about  the importance of commitment to a  minimum of 150 minutes of exercise per week. The importance of healthy food choices with portion control discussed. Encouraged to start a food diary, count calories and to consider  joining a support group. Sample diet sheets offered. Goals set by the patient for the next several months. Pt will start half phentermine ,daily, and "clean up" her eating environment , she is tocontinue daily exercise

## 2013-05-01 NOTE — Assessment & Plan Note (Signed)
evalauted recently by neurologist, sleep evaluation for possibe sleep apnea contributing to her headaches , referral to [facility] office per pt request

## 2013-05-01 NOTE — Assessment & Plan Note (Signed)
Reports doing better, seeing therapist regularly, sleep improved with recent medication and has also told her psych of recent intentionaloverdose with metformin, not with suicidal intent

## 2013-05-02 ENCOUNTER — Telehealth: Payer: Self-pay | Admitting: Family Medicine

## 2013-05-02 NOTE — Telephone Encounter (Signed)
Patient aware and verbalized understanding. °

## 2013-05-02 NOTE — Telephone Encounter (Signed)
Let her know that afterdiscussion with [name] he spicifically recommended the  half tablet, encourage her to work with this dose and on her other issues as well

## 2013-05-03 ENCOUNTER — Telehealth (HOSPITAL_COMMUNITY): Payer: Self-pay | Admitting: *Deleted

## 2013-05-03 NOTE — Telephone Encounter (Signed)
Recv'd letter from pt.Her new insurance requires her to use [facility] and get 90 day RX thru mail.She needs new RX sent for Wellbutrin and Prozac.They also limit Temazepam to 15 caps /month unless Prior Auth completed.

## 2013-05-05 ENCOUNTER — Encounter (HOSPITAL_COMMUNITY): Payer: Self-pay | Admitting: *Deleted

## 2013-05-05 NOTE — Progress Notes (Signed)
[facility] authorized Temazepam 30 mg capsules, #30 per month from 05/05/13 until 4/815 per representative [employer] [id number]

## 2013-05-09 ENCOUNTER — Telehealth: Payer: Self-pay | Admitting: Neurology

## 2013-05-09 MED ORDER — TOPIRAMATE 50 MG PO TABS: 50.0000 mg | ORAL_TABLET | Freq: Two times a day (BID) | ORAL | Status: DC

## 2013-05-09 NOTE — Telephone Encounter (Signed)
Calling to provide update after taking Topamax. Please call patient [phone] / Katelyn [name].

## 2013-05-09 NOTE — Telephone Encounter (Signed)
Patient notified

## 2013-05-09 NOTE — Telephone Encounter (Signed)
Noted.  Will send prescription for topamax 50mg  tablet - one tablet BID.   Please notify patient that Rx has been sent.  Donika K. Posey Pronto, DO

## 2013-05-09 NOTE — Telephone Encounter (Signed)
Patient call after taking the 25mg  Topamax for one month, patient has not had any side effects and would like to increase to 50mg  twice a day.

## 2013-05-11 ENCOUNTER — Other Ambulatory Visit (HOSPITAL_COMMUNITY): Payer: Self-pay | Admitting: *Deleted

## 2013-05-12 ENCOUNTER — Ambulatory Visit (INDEPENDENT_AMBULATORY_CARE_PROVIDER_SITE_OTHER): Payer: Managed Care, Other (non HMO) | Admitting: Psychiatry

## 2013-05-12 DIAGNOSIS — F333 Major depressive disorder, recurrent, severe with psychotic symptoms: Secondary | ICD-10-CM | POA: Diagnosis not present
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
## 2013-05-13 ENCOUNTER — Telehealth (HOSPITAL_COMMUNITY): Payer: Self-pay | Admitting: *Deleted

## 2013-05-13 DIAGNOSIS — F329 Major depressive disorder, single episode, unspecified: Secondary | ICD-10-CM

## 2013-05-13 MED ORDER — FLUOXETINE HCL 40 MG PO CAPS: 40.0000 mg | ORAL_CAPSULE | Freq: Every day | ORAL | Status: DC

## 2013-05-13 MED ORDER — BUPROPION HCL ER (XL) 150 MG PO TB24: 150.0000 mg | ORAL_TABLET | ORAL | Status: DC

## 2013-05-13 NOTE — Telephone Encounter (Signed)
Patient changed insurance. Insurance requires use of Mail Order and 90 day RX.  Requests 90 day supply of Prozac and Wellbutrin be sent [facility]. Authorized by [employer]

## 2013-05-20 ENCOUNTER — Ambulatory Visit (INDEPENDENT_AMBULATORY_CARE_PROVIDER_SITE_OTHER): Payer: Self-pay | Admitting: General Surgery

## 2013-05-24 ENCOUNTER — Encounter: Payer: Self-pay | Admitting: Family Medicine

## 2013-05-24 ENCOUNTER — Ambulatory Visit (INDEPENDENT_AMBULATORY_CARE_PROVIDER_SITE_OTHER): Payer: Medicare Other | Admitting: Family Medicine

## 2013-05-24 VITALS — BP 114/72 | HR 97 | Resp 16 | Wt 267.8 lb

## 2013-05-24 DIAGNOSIS — E8881 Metabolic syndrome: Secondary | ICD-10-CM | POA: Diagnosis not present

## 2013-05-24 DIAGNOSIS — E669 Obesity, unspecified: Secondary | ICD-10-CM | POA: Diagnosis not present

## 2013-05-24 DIAGNOSIS — J209 Acute bronchitis, unspecified: Secondary | ICD-10-CM | POA: Diagnosis not present

## 2013-05-24 DIAGNOSIS — G8929 Other chronic pain: Secondary | ICD-10-CM

## 2013-05-24 DIAGNOSIS — G479 Sleep disorder, unspecified: Secondary | ICD-10-CM

## 2013-05-24 DIAGNOSIS — R51 Headache: Secondary | ICD-10-CM

## 2013-05-24 DIAGNOSIS — R519 Headache, unspecified: Secondary | ICD-10-CM

## 2013-05-24 MED ORDER — PROMETHAZINE-DM 6.25-15 MG/5ML PO SYRP: ORAL_SOLUTION | ORAL | Status: DC

## 2013-05-24 MED ORDER — SULFAMETHOXAZOLE-TMP DS 800-160 MG PO TABS: 1.0000 | ORAL_TABLET | Freq: Two times a day (BID) | ORAL | Status: DC

## 2013-05-24 NOTE — Progress Notes (Signed)
   Subjective:    Patient ID: Katelyn [name], female    DOB: 1975-10-26, 38 y.o.   MRN: [id number]  HPI Pt in for f/u obesity, She has lost an excessive amt of weight and states part of this is due to illness. Had a  3 day h/o acute vomiting and diarreah and remained fairly ill for an additional 4 days. Had influenza about 2.5 weeks ago, again was unable  To eat much at the time, states did not take the phentermine for approx 2 weeks , only started back yesterday since she had no appetite She has started using 2 apps  To help with weight looss , which have both been beneficial States comited to taking the phentermine  Pre lunch as has better effect, using half tablet  Has been unable to exercise due ill health started yesterday and again got into excessive cough C/o chest congestion , yellow green sputum, rib pain with breathing and cough following influenza, no fever or chills   Continues to see her therapist , and her psych meds seem to be working well for her currently. Denies suicidal or homicidal ideation   Review of Systems See HPI C/o chills, no fever Denies sinus pressure, nasal congestion, ear pain or sore throat.  Denies chest pains, palpitations and leg swelling Denies abdominal pain, nausea, vomiting,diarrhea or constipation.   Denies dysuria, frequency, hesitancy or incontinence. Denies joint pain, swelling and limitation in mobility. Denies headaches, seizures, numbness, or tingling. Denies uncontrolled  depression, anxiety or insomnia. Denies skin break down or rash.         Objective:   Physical Exam Patient alert and oriented and in no cardiopulmonary distress.  HEENT: No facial asymmetry, EOMI, no sinus tenderness,  oropharynx pink and moist.  Neck supple no adenopathy.  Chest: Adequate air entry bilaterally, scattered crackles, no wheezes  CVS: S1, S2 no murmurs, no S3.  ABD: Soft non tender. Bowel sounds normal.  Ext: No edema  MS: Adequate ROM  spine, shoulders, hips and knees.  Skin: Intact, no ulcerations or rash noted.  Psych: Good eye contact, normal affect. Memory intact not anxious or depressed appearing.  CNS: CN 2-12 intact, power, tone and sensation normal throughout.        Assessment & Plan:

## 2013-05-24 NOTE — Patient Instructions (Addendum)
F/u in 4 weeks.  Behavioral change as discussed, esp with exercise and food choices. "My fitness pal" and "[employer]ilos" are excellent support apps and it is good that you have chosen to make them a part of your daily routine  Weight loss goal of 10 pounds per month  Medication is sent in for acute bacterial bronchitis

## 2013-05-26 ENCOUNTER — Ambulatory Visit (HOSPITAL_COMMUNITY): Payer: Self-pay | Admitting: Psychiatry

## 2013-05-30 ENCOUNTER — Ambulatory Visit (INDEPENDENT_AMBULATORY_CARE_PROVIDER_SITE_OTHER): Payer: Medicare Other | Admitting: Psychiatry

## 2013-05-30 ENCOUNTER — Ambulatory Visit (HOSPITAL_COMMUNITY): Payer: Self-pay | Admitting: Psychiatry

## 2013-05-30 DIAGNOSIS — F411 Generalized anxiety disorder: Secondary | ICD-10-CM | POA: Diagnosis not present

## 2013-05-30 DIAGNOSIS — F333 Major depressive disorder, recurrent, severe with psychotic symptoms: Secondary | ICD-10-CM
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
## 2013-06-01 ENCOUNTER — Other Ambulatory Visit: Payer: Self-pay | Admitting: Family Medicine

## 2013-06-01 DIAGNOSIS — G479 Sleep disorder, unspecified: Secondary | ICD-10-CM | POA: Insufficient documentation

## 2013-06-01 DIAGNOSIS — E8881 Metabolic syndrome: Secondary | ICD-10-CM | POA: Insufficient documentation

## 2013-06-01 MED ORDER — PHENTERMINE HCL 37.5 MG PO TABS: ORAL_TABLET | ORAL | Status: DC

## 2013-06-01 NOTE — Assessment & Plan Note (Signed)
Acute infection following influenza, antibiotic and cough suppressant prescribed

## 2013-06-01 NOTE — Assessment & Plan Note (Signed)
Pt has been labeled as prediabetic by endo per her report, her abdominal girth exceeds 33 inches and she is morbidly obese The increased risk of cardiovascular disease associated with this diagnosis, and the need to consistently work on lifestyle to change this is discussed. Following  a  heart healthy diet ,commitment to 30 minutes of exercise at least 5 days per week, as well as control of blood sugar and cholesterol , and achieving a healthy weight are all the areas to be addressed .
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
## 2013-06-09 ENCOUNTER — Ambulatory Visit (HOSPITAL_COMMUNITY): Payer: Self-pay | Admitting: Psychiatry

## 2013-06-09 ENCOUNTER — Inpatient Hospital Stay (HOSPITAL_COMMUNITY)
Admission: RE | Admit: 2013-06-09 | Discharge: 2013-06-13 | DRG: 885 | Disposition: A | Discharge status: Home / Self Care | Payer: Managed Care, Other (non HMO) | Attending: Psychiatry | Admitting: Psychiatry

## 2013-06-09 ENCOUNTER — Encounter (HOSPITAL_COMMUNITY): Payer: Self-pay

## 2013-06-09 ENCOUNTER — Ambulatory Visit (INDEPENDENT_AMBULATORY_CARE_PROVIDER_SITE_OTHER): Payer: Medicare Other | Admitting: Psychiatry

## 2013-06-09 ENCOUNTER — Telehealth (HOSPITAL_COMMUNITY): Payer: Self-pay | Admitting: *Deleted

## 2013-06-09 DIAGNOSIS — F410 Panic disorder [episodic paroxysmal anxiety] without agoraphobia: Secondary | ICD-10-CM | POA: Diagnosis present

## 2013-06-09 DIAGNOSIS — Z8249 Family history of ischemic heart disease and other diseases of the circulatory system: Secondary | ICD-10-CM | POA: Diagnosis not present

## 2013-06-09 DIAGNOSIS — Z8701 Personal history of pneumonia (recurrent): Secondary | ICD-10-CM

## 2013-06-09 DIAGNOSIS — Z23 Encounter for immunization: Secondary | ICD-10-CM | POA: Diagnosis not present

## 2013-06-09 DIAGNOSIS — F333 Major depressive disorder, recurrent, severe with psychotic symptoms: Secondary | ICD-10-CM | POA: Diagnosis not present

## 2013-06-09 DIAGNOSIS — K589 Irritable bowel syndrome without diarrhea: Secondary | ICD-10-CM | POA: Diagnosis present

## 2013-06-09 DIAGNOSIS — Z87891 Personal history of nicotine dependence: Secondary | ICD-10-CM

## 2013-06-09 DIAGNOSIS — G4730 Sleep apnea, unspecified: Secondary | ICD-10-CM

## 2013-06-09 DIAGNOSIS — R45851 Suicidal ideations: Secondary | ICD-10-CM

## 2013-06-09 DIAGNOSIS — G4700 Insomnia, unspecified: Secondary | ICD-10-CM | POA: Diagnosis present

## 2013-06-09 DIAGNOSIS — G479 Sleep disorder, unspecified: Secondary | ICD-10-CM | POA: Diagnosis present

## 2013-06-09 DIAGNOSIS — F411 Generalized anxiety disorder: Secondary | ICD-10-CM | POA: Diagnosis not present

## 2013-06-09 DIAGNOSIS — Z8051 Family history of malignant neoplasm of kidney: Secondary | ICD-10-CM

## 2013-06-09 DIAGNOSIS — Z800 Family history of malignant neoplasm of digestive organs: Secondary | ICD-10-CM

## 2013-06-09 DIAGNOSIS — F509 Eating disorder, unspecified: Secondary | ICD-10-CM

## 2013-06-09 MED ORDER — MAGNESIUM HYDROXIDE 400 MG/5ML PO SUSP: 30.0000 mL | Freq: Every day | ORAL | Status: DC | PRN

## 2013-06-09 MED ORDER — ACETAMINOPHEN 325 MG PO TABS: 650.0000 mg | ORAL_TABLET | Freq: Four times a day (QID) | ORAL | Status: DC | PRN

## 2013-06-09 MED ORDER — TRAZODONE HCL 50 MG PO TABS
50.0000 mg | ORAL_TABLET | Freq: Every evening | ORAL | Status: DC | PRN
  Administered 2013-06-09 – 2013-06-10 (×2): 50 mg via ORAL
  Filled 2013-06-09 (×2): qty 1

## 2013-06-09 MED ORDER — ALUM & MAG HYDROXIDE-SIMETH 200-200-20 MG/5ML PO SUSP: 30.0000 mL | ORAL | Status: DC | PRN

## 2013-06-09 MED ORDER — INFLUENZA VAC SPLIT QUAD 0.5 ML IM SUSP
0.5000 mL | INTRAMUSCULAR | Status: DC
  Filled 2013-06-09: qty 0.5

## 2013-06-09 MED ORDER — HYDROXYZINE HCL 50 MG PO TABS
50.0000 mg | ORAL_TABLET | Freq: Four times a day (QID) | ORAL | Status: DC | PRN
  Administered 2013-06-09: 50 mg via ORAL
  Filled 2013-06-09: qty 1

## 2013-06-09 NOTE — BH Assessment (Signed)
Completed face to face assessment with patient at  4:30 PM; patient requested earlier today that husband secure all medications in the home due to worsening SI; no HI nor Hallucinations.  Patient admitted per [name] and [facility] Debarah Crape to room [phone] with depression/anxiety and increasing suicidal ideation over the last month. Sheilah Pigeon, LCSW
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
## 2013-06-09 NOTE — Tx Team (Signed)
Initial Interdisciplinary Treatment Plan  PATIENT STRENGTHS: (choose at least two) Ability for insight Supportive family/friends  PATIENT STRESSORS: Financial difficulties Marital or family conflict Traumatic event   PROBLEM LIST: Problem List/Patient Goals Date to be addressed Date deferred Reason deferred Estimated date of resolution  depression      anxiety      Passive si      paranoia                                     DISCHARGE CRITERIA:  Ability to meet basic life and health needs Adequate post-discharge living arrangements Improved stabilization in mood, thinking, and/or behavior Motivation to continue treatment in a less acute level of care  PRELIMINARY DISCHARGE PLAN: Attend aftercare/continuing care group Attend PHP/IOP Participate in family therapy Return to previous living arrangement  PATIENT/FAMIILY INVOLVEMENT: This treatment plan has been presented to and reviewed with the patient, Katelyn [name].  The patient and family have been given the opportunity to ask questions and make suggestions.  Katelyn [name] M 06/09/2013, 9:32 PM

## 2013-06-09 NOTE — Telephone Encounter (Signed)
Contacted pt at Dr. Marguerite Olea request. Informed pt that Dr,. Arfeen had spoken with therapist [name]. He wants patient to be admitted to [facility] inpatient today.Advised pt to go to [facility] ED an tell them Dr. Adele Schilder wants her admitted to [facility] inpatient. Patient states she will either do that or come to [facility] and tell them Dr.Arfeen wants her to be admitted.
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
## 2013-06-10 DIAGNOSIS — R45851 Suicidal ideations: Secondary | ICD-10-CM

## 2013-06-10 MED ORDER — TOPIRAMATE 25 MG PO TABS
50.0000 mg | ORAL_TABLET | Freq: Two times a day (BID) | ORAL | Status: DC
  Administered 2013-06-10 – 2013-06-13 (×7): 50 mg via ORAL
  Filled 2013-06-10 (×11): qty 2

## 2013-06-10 MED ORDER — ZIPRASIDONE HCL 40 MG PO CAPS
40.0000 mg | ORAL_CAPSULE | ORAL | Status: DC
  Administered 2013-06-10: 40 mg via ORAL
  Filled 2013-06-10 (×3): qty 1

## 2013-06-10 MED ORDER — TEMAZEPAM 15 MG PO CAPS
30.0000 mg | ORAL_CAPSULE | Freq: Every day | ORAL | Status: DC
  Administered 2013-06-10 – 2013-06-12 (×3): 30 mg via ORAL
  Filled 2013-06-10 (×3): qty 2

## 2013-06-10 MED ORDER — FLUOXETINE HCL 20 MG PO CAPS
40.0000 mg | ORAL_CAPSULE | Freq: Every day | ORAL | Status: DC
  Administered 2013-06-10 – 2013-06-13 (×4): 40 mg via ORAL
  Filled 2013-06-10 (×3): qty 2
  Filled 2013-06-10: qty 8
  Filled 2013-06-10 (×3): qty 2

## 2013-06-10 MED ORDER — ZIPRASIDONE HCL 80 MG PO CAPS
80.0000 mg | ORAL_CAPSULE | Freq: Two times a day (BID) | ORAL | Status: DC
  Administered 2013-06-10 – 2013-06-13 (×6): 80 mg via ORAL
  Filled 2013-06-10 (×3): qty 1
  Filled 2013-06-10: qty 8
  Filled 2013-06-10 (×5): qty 1
  Filled 2013-06-10: qty 8
  Filled 2013-06-10 (×2): qty 1

## 2013-06-10 MED ORDER — METFORMIN HCL 500 MG PO TABS
500.0000 mg | ORAL_TABLET | Freq: Two times a day (BID) | ORAL | Status: DC
  Administered 2013-06-10 – 2013-06-13 (×7): 500 mg via ORAL
  Filled 2013-06-10 (×11): qty 1

## 2013-06-10 MED ORDER — ZIPRASIDONE HCL 80 MG PO CAPS
80.0000 mg | ORAL_CAPSULE | Freq: Every day | ORAL | Status: DC
  Filled 2013-06-10: qty 1

## 2013-06-10 NOTE — [facility] Suicide Risk Assessment (Signed)
   Nursing information obtained from:    Demographic factors:    Current Mental Status:    Loss Factors:    Historical Factors:    Risk Reduction Factors:    Total Time spent with patient: 45 minutes  CLINICAL FACTORS:   Severe Anxiety and/or Agitation Depression:   Anhedonia Delusional Hopelessness Impulsivity Insomnia Recent sense of peace/wellbeing Severe More than one psychiatric diagnosis Currently Psychotic Previous Psychiatric Diagnoses and Treatments Medical Diagnoses and Treatments/Surgeries  Psychiatric Specialty Exam: Physical Exam  Constitutional: She is oriented to person, place, and time. She appears well-developed.  HENT:  Head: Normocephalic.  Eyes: Pupils are equal, round, and reactive to light.  Neck: Normal range of motion.  Cardiovascular: Normal rate.   Respiratory: Effort normal.  GI: Soft.  Musculoskeletal: Normal range of motion.  Neurological: She is alert and oriented to person, place, and time.  Skin: Skin is warm.    Review of Systems  Psychiatric/Behavioral: Positive for depression and suicidal ideas. The patient is nervous/anxious and has insomnia.   All other systems reviewed and are negative.    Blood pressure 120/82, pulse 87, temperature 99.1 F (37.3 C), temperature source Oral, resp. rate 16, height 5\' 6"  (1.676 m), weight 121.11 kg (267 lb).Body mass index is 43.12 kg/(m^2).  General Appearance: Disheveled and Guarded  Eye Contact::  Minimal  Speech:  Clear and Coherent  Volume:  Decreased  Mood:  Anxious, Depressed, Hopeless and Worthless  Affect:  Depressed and Flat  Thought Process:  Goal Directed and Intact  Orientation:  Full (Time, Place, and Person)  Thought Content:  WDL, Paranoid Ideation and Rumination  Suicidal Thoughts:  Yes.  without intent/plan  Homicidal Thoughts:  No  Memory:  Immediate;   Fair  Judgement:  Intact  Insight:  Fair  Psychomotor Activity:  Psychomotor Retardation  Concentration:  Fair   Recall:  [place] of Knowledge:Good  Language: Good  Akathisia:  NA  Handed:  Right  AIMS (if indicated):     Assets:  Communication Skills Desire for Improvement Financial Resources/Insurance Housing Intimacy Leisure Time Physical Health Resilience Social Support Transportation  Sleep:  Number of Hours: 0   Musculoskeletal: Strength & Muscle Tone: within normal limits Gait & Station: normal Patient leans: N/A  COGNITIVE FEATURES THAT CONTRIBUTE TO RISK:  Closed-mindedness Loss of executive function Polarized thinking    SUICIDE RISK:   Moderate:  Frequent suicidal ideation with limited intensity, and duration, some specificity in terms of plans, no associated intent, good self-control, limited dysphoria/symptomatology, some risk factors present, and identifiable protective factors, including available and accessible social support.  PLAN OF CARE: Admitted for crisis stabilization, safety monitoring and medication management for major depressive disorder recurrent severe with psychotic symptoms and suicidal ideation with plan but without intention.  I certify that inpatient services furnished can reasonably be expected to improve the patient's condition.  Brandie Lopes,JANARDHAHA R. 06/10/2013, 10:59 AM

## 2013-06-10 NOTE — Progress Notes (Signed)
Patient appeared flat, depressed and seemed worried at the beginning of the shift. She reported that she came to the unit at about 3 pm and also want to know what the physician would do with his medications. She denied SI/HI and denied hallucinations. Writer notified physician patient and requested for Admission orders and HS medications for patient. Administered medications as ordered. Writer notified patient that she would be evaluated in the morning and physician would talk to her about her medications. Patient receptive to encouragement and support. Received HS medications as ordered without any difficulty. Q 15 minute check

## 2013-06-10 NOTE — BHH Group Notes (Signed)
[name] Aftercare Discharge Planning Group Note   06/10/2013 10:08 AM  Participation Quality:  Minimal  Mood/Affect:  Flat and Depressed  Depression Rating:  8  Anxiety Rating:  8  Thoughts of Suicide:  No Will you contract for safety?   Yes  Current AVH:  No  Plan for Discharge/Comments:  Patient reports she follows up with Dr. Adele Schilder and a therapist at [facility].  Patient was very quiet and limited engagement in group this morning.  Transportation Means: family/friends  Supports: family/friends  Katelyn [name]

## 2013-06-10 NOTE — Care Management Utilization Note (Signed)
Per State Regulation 482.30  The chart was reviewed for necessity with respect to the patient's Admission/ Duration of stay.Admission 06/09/13 SI with plan.  Next Review Date: 1/66/06  Conception Oms, RN, BSN
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
## 2013-06-10 NOTE — BHH Group Notes (Signed)
[place] LCSW Group Therapy  06/10/2013 3:29 PM   Type of Therapy:  Group Therapy  Participation Level:  Active  Participation Quality:  Attentive  Affect:  Appropriate  Cognitive:  Appropriate  Insight:  Improving  Engagement in Therapy:  Engaged  Modes of Intervention:  Clarification, Education, Exploration and Socialization  Summary of Progress/Problems: Today's group focused on relapse prevention.  We defined the term, and then brainstormed on ways to prevent relapse.  Was quiet throughout group.  Appeared to sleep for much of the time.  When asked directly, declined to comment.  Roque Lias B 06/10/2013 , 3:29 PM

## 2013-06-10 NOTE — Progress Notes (Signed)
D) Pt has been attending the groups. Affect is flat and mood is depressed. Rates her depression and her hopelessness both at a 9 and admits to thoughts of SI. States that she did not sleep last night "which makes everything worse". Pt reports that she has been taking Restoril 30 mg for years and that it works well. A) Encouraged to talk with the doctor to change her sleep medication. Verbal contract made with Pt for her safety on the unit. Given encouragement to attend the groups and to participate. R) Pt continues with a sad, flat affect and depressed mood. Did contract for her safety.
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
## 2013-06-11 DIAGNOSIS — F411 Generalized anxiety disorder: Secondary | ICD-10-CM

## 2013-06-11 DIAGNOSIS — F332 Major depressive disorder, recurrent severe without psychotic features: Secondary | ICD-10-CM

## 2013-06-11 NOTE — Progress Notes (Signed)
D) Pt rates her depression and hopelessness both at a 6 and denies SI and HI. Has attended the groups and interacts with seldct peers. States that she slept last night and that really helped her to feel a bit better about things. Affect remains flat and mood derpessed A) Given support, reassurance and praise.  R) Dneies SI and HI

## 2013-06-11 NOTE — Progress Notes (Signed)
[facility] MD Progress Note  06/11/2013 5:33 PM Katelyn [name]  MRN:  [id number] Subjective:  Patient forwarded little information.  She stated her sleep and appetite are "good", mood is "pretty good."  Katelyn [name] presents with a flat affect and depressed mood.  She stated prior to admission she broke down, overwhelmed with everything, paranoid, and disconnected or depersonalized.  Since admission, she has opened the communication lines with her parents and let them know how depressed and anxious she was.  [name] stated she felt like a burden to them and her family.  Her mother is going out of the country next week and her father is coming to help her with her two children; husband also supportive.   Diagnosis:   DSM5:  Depressive Disorders:  Major Depressive Disorder - with Psychotic Features (296.24) Total Time spent with patient: 30 minutes  Axis I: Anxiety Disorder NOS and Major Depression, Recurrent severe Axis II: Deferred Axis III:  Past Medical History  Diagnosis Date  . Obesity   . Skin lesion     Excisional biopsy  . Depression     several suicide attempts, hospitaluzed in 2012 for this  . Dizziness - light-headed   . Polycystic ovary   . Anxiety   . Chronic headaches   . IBS (irritable bowel syndrome)   . History of pneumonia     x 3  . Benign juvenile melanoma   . Colon polyps     found on colonoscopy 04/26/2012  . Sleep apnea     has lost 100lbs.   Axis IV: other psychosocial or environmental problems, problems related to social environment and problems with primary support group Axis V: 41-50 serious symptoms  ADL's:  Intact  Sleep: Good  Appetite:  Good  Suicidal Ideation:  Denies Homicidal Ideation:  Denies  Psychiatric Specialty Exam: Physical Exam  Constitutional: She is oriented to person, place, and time. She appears well-developed and well-nourished.  HENT:  Head: Normocephalic and atraumatic.  Neck: Normal range of motion.  Respiratory: Effort normal.   Musculoskeletal: Normal range of motion.  Neurological: She is alert and oriented to person, place, and time.  Skin: Skin is warm and dry.    Review of Systems  Constitutional: Negative.   HENT: Negative.   Eyes: Negative.   Respiratory: Negative.   Cardiovascular: Negative.   Gastrointestinal: Negative.   Genitourinary: Negative.   Musculoskeletal: Negative.   Skin: Negative.   Neurological: Negative.   Endo/Heme/Allergies: Negative.   Psychiatric/Behavioral: Positive for depression and suicidal ideas. The patient is nervous/anxious.     Blood pressure 100/67, pulse 91, temperature 97.8 F (36.6 C), temperature source Oral, resp. rate 18, height 5\' 6"  (1.676 m), weight 121.11 kg (267 lb).Body mass index is 43.12 kg/(m^2).  General Appearance: Casual  Eye Contact::  Fair  Speech:  Slow  Volume:  Decreased  Mood:  Anxious and Depressed  Affect:  Congruent  Thought Process:  Coherent  Orientation:  Full (Time, Place, and Person)  Thought Content:  Rumination  Suicidal Thoughts:  No  Homicidal Thoughts:  No  Memory:  Immediate;   Fair Recent;   Fair Remote;   Fair  Judgement:  Fair  Insight:  Lacking  Psychomotor Activity:  Decreased  Concentration:  Fair  Recall:  [employer] of Knowledge:Fair  Language: Fair  Akathisia:  No  Handed:  Right  AIMS (if indicated):     Assets:  Resilience  Sleep:  Number of Hours: 6.75   Musculoskeletal: Strength &  Muscle Tone: within normal limits Gait & Station: normal Patient leans: N/A  Current Medications: Current Facility-Administered Medications  Medication Dose Route Frequency Provider Last Rate Last Dose  . acetaminophen (TYLENOL) tablet 650 mg  650 mg Oral Q6H PRN Waldon Merl, MD      . alum & mag hydroxide-simeth (MAALOX/MYLANTA) 200-200-20 MG/5ML suspension 30 mL  30 mL Oral Q4H PRN Waldon Merl, MD      . FLUoxetine (PROZAC) capsule 40 mg  40 mg Oral Daily Durward Parcel, MD   40 mg at 06/11/13  0809  . hydrOXYzine (ATARAX/VISTARIL) tablet 50 mg  50 mg Oral Q6H PRN Waldon Merl, MD   50 mg at 06/09/13 2315  . influenza vac split quadrivalent PF (FLUARIX) injection 0.5 mL  0.5 mL Intramuscular Tomorrow-1000 Durward Parcel, MD      . magnesium hydroxide (MILK OF MAGNESIA) suspension 30 mL  30 mL Oral Daily PRN Waldon Merl, MD      . metFORMIN (GLUCOPHAGE) tablet 500 mg  500 mg Oral BID WC Lurena Nida, NP   500 mg at 06/11/13 1713  . temazepam (RESTORIL) capsule 30 mg  30 mg Oral QHS Durward Parcel, MD   30 mg at 06/10/13 2131  . topiramate (TOPAMAX) tablet 50 mg  50 mg Oral BID Lurena Nida, NP   50 mg at 06/11/13 1713  . ziprasidone (GEODON) capsule 80 mg  80 mg Oral BID WC Durward Parcel, MD   80 mg at 06/11/13 1713    Lab Results: No results found for this or any previous visit (from the past 48 hour(s)).  Physical Findings: AIMS: Facial and Oral Movements Muscles of Facial Expression: None, normal Lips and Perioral Area: None, normal Jaw: None, normal Tongue: None, normal,Extremity Movements Upper (arms, wrists, hands, fingers): None, normal Lower (legs, knees, ankles, toes): None, normal, Trunk Movements Neck, shoulders, hips: None, normal, Overall Severity Severity of abnormal movements (highest score from questions above): None, normal Incapacitation due to abnormal movements: None, normal Patient's awareness of abnormal movements (rate only patient's report): No Awareness, Dental Status Current problems with teeth and/or dentures?: No Does patient usually wear dentures?: No  CIWA:    COWS:     Treatment Plan Summary: Daily contact with patient to assess and evaluate symptoms and progress in treatment Medication management  Plan:  Review of chart, vital signs, medications, and notes. 1-Individual and group therapy 2-Medication management for depression and anxiety:  Medications reviewed with the patient and she stated no  untoward effects, no changes made 3-Coping skills for depression, anxiety 4-Continue crisis stabilization and management 5-Address health issues--monitoring vital signs, stable 6-Treatment plan in progress to prevent relapse of depression and anxiety  Medical Decision Making Problem Points:  Established problem, stable/improving (1) and Review of psycho-social stressors (1) Data Points:  Review of medication regiment & side effects (2)  I certify that inpatient services furnished can reasonably be expected to improve the patient's condition.   Waylan Boga, [place] 06/11/2013, 5:33 PM

## 2013-06-11 NOTE — BHH Group Notes (Signed)
[place] Group Notes:  (Clinical Social Work)  06/11/2013   1:15-2:15PM  Summary of Progress/Problems:   The main focus of today's process group was for the patient to identify ways in which they have sabotaged their own mental health wellness/recovery.  Motivational interviewing and a handout were used to explore the benefits and costs of their self-sabotaging behavior as well as the benefits and costs of changing this behavior.  The Stages of Change were explained to the group using a handout, and patients identified where they are with regard to changing self-defeating behaviors.  The patient expressed she self-sabotages with giving in to depression, isolating and withdrawing herself.  She feels defeated because she has tried before and failed, and feels little hope at this time.  Type of Therapy:  Process Group  Participation Level:  Active  Participation Quality:  Attentive  Affect:  Anxious and Depressed  Cognitive:  Appropriate  Insight:  Developing/Improving  Engagement in Therapy:  Engaged  Modes of Intervention:  Education, Motivational Interviewing   Selmer Dominion, LCSW 06/11/2013, 4:00pm

## 2013-06-11 NOTE — Progress Notes (Signed)
Writer spoke with patient 1:1 and she is concerned with not being able to sleep tonight. She reports that she did not rest well last nigt. She is aware that her sleep medication has been changed to the same medication she takes at home and is hopeful that she will rest well. She attended group this evening and participated. She reports issues with increased anxiety and paranoia. She is aware also that her geodon has been increased to help with these issues. She appears anxious and requested her medication so she can get some sleep. She currently reports passive si and verbally contracts for safety, denies hi/a/v hallucinations. Safety maintained on unit with 15 min checks.

## 2013-06-11 NOTE — Progress Notes (Signed)
Adult Psychoeducational Group Note  Date:  06/11/2013 Time:  1:25 AM  Group Topic/Focus:  Wrap-Up Group:   The focus of this group is to help patients review their daily goal of treatment and discuss progress on daily workbooks.  Participation Level:  Active  Participation Quality:  Appropriate  Affect:  Appropriate  Cognitive:  Alert and Appropriate  Insight: Appropriate and Good  Engagement in Group:  Engaged  Modes of Intervention:  Discussion  Additional Comments:   Pt attended wrap-up group this evening and participated in group with peers.  Demarri Elie A 06/11/2013, 1:25 AM

## 2013-06-12 DIAGNOSIS — F333 Major depressive disorder, recurrent, severe with psychotic symptoms: Principal | ICD-10-CM

## 2013-06-12 DIAGNOSIS — F411 Generalized anxiety disorder: Secondary | ICD-10-CM

## 2013-06-12 NOTE — Progress Notes (Signed)
Patient ID: Katelyn [name], female   DOB: 1975-10-31, 38 y.o.   MRN: [id number] [facility] MD Progress Note  06/12/2013 4:05 PM Katelyn [name]  MRN:  [id number] Subjective: "I have been doing well since I was admitted here. I hope my doctor will let me go home soon.'' Objective:  Patient endorsed decreased poor impulse control, anxiety and depressive symptoms. She reports decreased suicidal thoughts and denies auditory/visual hallucinations. Patient has been compliant with her medications and attending on the unit milieu. She states that she hope to be discharged soon so that she can re-unite with her children, husband and other family members. Diagnosis:   DSM5:  Depressive Disorders:  Major Depressive Disorder - with Psychotic Features (296.24) Total Time spent with patient: 30 minutes  Axis I: Anxiety Disorder NOS and Major Depression, Recurrent severe Axis II: Deferred Axis III:  Past Medical History  Diagnosis Date  . Obesity   . Skin lesion     Excisional biopsy    several suicide attempts, hospitaluzed in 2012 for this  . Dizziness - light-headed   . Polycystic ovary   . Chronic headaches   . IBS (irritable bowel syndrome)   . History of pneumonia     x 3  . Benign juvenile melanoma   . Colon polyps     found on colonoscopy 04/26/2012  . Sleep apnea     has lost 100lbs.   Axis IV: other psychosocial or environmental problems, problems related to social environment and problems with primary support group Axis V: 41-50 serious symptoms  ADL's:  Intact  Sleep: Good  Appetite:  Good  Suicidal Ideation:  Denies Homicidal Ideation:  Denies  Psychiatric Specialty Exam: Physical Exam  Constitutional: She is oriented to person, place, and time. She appears well-developed and well-nourished.  HENT:  Head: Normocephalic and atraumatic.  Neck: Normal range of motion.  Respiratory: Effort normal.  Musculoskeletal: Normal range of motion.  Neurological: She is alert and  oriented to person, place, and time.  Skin: Skin is warm and dry.    Review of Systems  Constitutional: Negative.   HENT: Negative.   Eyes: Negative.   Respiratory: Negative.   Cardiovascular: Negative.   Gastrointestinal: Negative.   Genitourinary: Negative.   Musculoskeletal: Negative.   Skin: Negative.   Neurological: Negative.   Endo/Heme/Allergies: Negative.   Psychiatric/Behavioral: Positive for depression and suicidal ideas. The patient is nervous/anxious.     Blood pressure 115/71, pulse 92, temperature 98.2 F (36.8 C), temperature source Oral, resp. rate 18, height 5\' 6"  (1.676 m), weight 121.11 kg (267 lb).Body mass index is 43.12 kg/(m^2).  General Appearance: Casual  Eye Contact::  Fair  Speech:  Slow  Volume:  Decreased  Mood:  Anxious and Depressed  Affect:  Congruent  Thought Process:  Coherent  Orientation:  Full (Time, Place, and Person)  Thought Content:  Rumination  Suicidal Thoughts:  No  Homicidal Thoughts:  No  Memory:  Immediate;   Fair Recent;   Fair Remote;   Fair  Judgement:  Fair  Insight:  Lacking  Psychomotor Activity:  Decreased  Concentration:  Fair  Recall:  [employer] of Knowledge:Fair  Language: Fair  Akathisia:  No  Handed:  Right  AIMS (if indicated):     Assets:  Resilience  Sleep:  Number of Hours: 5.75   Musculoskeletal: Strength & Muscle Tone: within normal limits Gait & Station: normal Patient leans: N/A  Current Medications: Current Facility-Administered Medications  Medication Dose Route Frequency  Provider Last Rate Last Dose  . acetaminophen (TYLENOL) tablet 650 mg  650 mg Oral Q6H PRN Waldon Merl, MD      . alum & mag hydroxide-simeth (MAALOX/MYLANTA) 200-200-20 MG/5ML suspension 30 mL  30 mL Oral Q4H PRN Waldon Merl, MD      . FLUoxetine (PROZAC) capsule 40 mg  40 mg Oral Daily Durward Parcel, MD   40 mg at 06/12/13 0809  . hydrOXYzine (ATARAX/VISTARIL) tablet 50 mg  50 mg Oral Q6H PRN Waldon Merl, MD   50 mg at 06/09/13 2315  . influenza vac split quadrivalent PF (FLUARIX) injection 0.5 mL  0.5 mL Intramuscular Tomorrow-1000 Durward Parcel, MD      . magnesium hydroxide (MILK OF MAGNESIA) suspension 30 mL  30 mL Oral Daily PRN Waldon Merl, MD      . metFORMIN (GLUCOPHAGE) tablet 500 mg  500 mg Oral BID WC Lurena Nida, NP   500 mg at 06/12/13 0809  . temazepam (RESTORIL) capsule 30 mg  30 mg Oral QHS Durward Parcel, MD   30 mg at 06/11/13 2102  . topiramate (TOPAMAX) tablet 50 mg  50 mg Oral BID Lurena Nida, NP   50 mg at 06/12/13 0809  . ziprasidone (GEODON) capsule 80 mg  80 mg Oral BID WC Durward Parcel, MD   80 mg at 06/12/13 0867    Lab Results: No results found for this or any previous visit (from the past 30 hour(s)).  Physical Findings: AIMS: Facial and Oral Movements Muscles of Facial Expression: None, normal Lips and Perioral Area: None, normal Jaw: None, normal Tongue: None, normal,Extremity Movements Upper (arms, wrists, hands, fingers): None, normal Lower (legs, knees, ankles, toes): None, normal, Trunk Movements Neck, shoulders, hips: None, normal, Overall Severity Severity of abnormal movements (highest score from questions above): None, normal Incapacitation due to abnormal movements: None, normal Patient's awareness of abnormal movements (rate only patient's report): No Awareness, Dental Status Current problems with teeth and/or dentures?: No Does patient usually wear dentures?: No  CIWA:    COWS:     Treatment Plan Summary: Daily contact with patient to assess and evaluate symptoms and progress in treatment Medication management  Plan:  Review of chart, vital signs, medications, and notes. 1-Individual and group therapy 2-Medication management for depression and anxiety:  Medications reviewed with the patient and she stated no untoward effects, no changes made 3-Coping skills for depression,  anxiety 4-Continue crisis stabilization and management 5-Address health issues--monitoring vital signs, stable 6-Treatment plan in progress to prevent relapse of depression and anxiety  Medical Decision Making Problem Points:  Established problem, stable/improving (1) and Review of psycho-social stressors (1) Data Points:  Review of medication regiment & side effects (2)  I certify that inpatient services furnished can reasonably be expected to improve the patient's condition.   Corena Pilgrim, MD 06/12/2013, 4:05 PM

## 2013-06-12 NOTE — Progress Notes (Signed)
Adult Psychoeducational Group Note  Date:  06/12/2013 Time:  8:00 pm  Group Topic/Focus:  Wrap-Up Group:   The focus of this group is to help patients review their daily goal of treatment and discuss progress on daily workbooks.  Participation Level:  Active  Participation Quality:  Appropriate and Sharing  Affect:  Appropriate  Cognitive:  Appropriate  Insight: Appropriate  Engagement in Group:  Engaged  Modes of Intervention:  Discussion, Education, Socialization and Support  Additional Comments:  Pt stated that she has learned to develop a plan of action to follow when she is unable to take her medication. Pt stated that she is a good listener and a good friend.   Wynetta Emery, Dejuana Weist 06/12/2013, 11:27 PM

## 2013-06-12 NOTE — Progress Notes (Signed)
[facility] Group Notes:  (Nursing/MHT/Case Management/Adjunct)  Date:  06/12/2013  Time:  12:09 AM  Type of Therapy:  Group Therapy  Participation Level:  Minimal  Participation Quality:  Appropriate  Affect:  Appropriate  Cognitive:  Appropriate  Insight:  Appropriate  Engagement in Group:  Engaged  Modes of Intervention:  Socialization and Support  Summary of Progress/Problems: Pt. Stated she had a good day.  Pt. Stated she was "mindfulness" and was developing coping skills for anxiety.  Lanell Persons 06/12/2013, 12:09 AM

## 2013-06-12 NOTE — Progress Notes (Signed)
D) Pt was thinking about getting sick and ending up in the hospital. States that she had the flu last week and wasn't able to eat. When she doesn't eat, she cannot take her Geodon and she states that she needs the Geodon not to feel frightened.Stated the last time she was sick and couldn't eat,  she also started feeling paranoid and feeling compromised in her psychological illness. Feels the physical issues are directly related to her psychological. Rates her depression and hopelessness both at a 4 and denies SI and HI. A) Brainstormed about different ways she could take her medications, even though she is not eating a full meal. Given support and reassurance and encouraged to talk with the doctor about he concerns. R) Denies SI and HI.

## 2013-06-12 NOTE — Progress Notes (Signed)
Late entry for 06/11/2013. Written on 06/12/2013  Psychoeducational Group Note    Date: 06/12/2013 Time:  0930  Goal Setting Purpose of Group: To be able to set a goal that is measurable and that can be accomplished in one day Participation Level:  Active  Participation Quality:  Appropriate  Affect:  Appropriate  Cognitive:  Oriented  Insight:  Improving  Engagement in Group:  Engaged  Additional Comments:  Participating and engaged.  Paulino Rily

## 2013-06-12 NOTE — Progress Notes (Signed)
Psychoeducational Group Note  Date:  06/12/2013 Time:  1015  Group Topic/Focus:  Making Healthy Choices:   The focus of this group is to help patients identify negative/unhealthy choices they were using prior to admission and identify positive/healthier coping strategies to replace them upon discharge.  Participation Level:  Active  Participation Quality:  Appropriate  Affect:  Appropriate  Cognitive:  Oriented  Insight:  Improving  Engagement in Group:  Engaged  Additional Comments:    Paulino Rily 06/12/2013

## 2013-06-12 NOTE — BHH Group Notes (Signed)
[facility] Group Notes:  (Clinical Social Work)  06/12/2013   1:15-2:15PM  Summary of Progress/Problems:  The main focus of today's process group was to   identify the patient's current support system and decide on other supports that can be put in place.  The picture on workbook was used to discuss why additional supports are needed.  An emphasis was placed on using counselor, doctor, therapy groups, 12-step groups, and problem-specific support groups to expand supports.   There was also an extensive discussion about what constitutes a healthy support versus an unhealthy support.  The patient expressed full comprehension of the concepts presented, and agreed that there is a need to add more supports.  The patient stated the current supports in place are her psychiatrist, therapist, parents, and husband.  She stated she wants to add supports of friends with the same issues.  Type of Therapy:  Process Group  Participation Level:  Active  Participation Quality:  Attentive and Sharing  Affect:  Blunted  Cognitive:  Appropriate and Oriented  Insight:  Engaged  Engagement in Therapy:  Engaged  Modes of Intervention:  Education,  Support and [employer], LCSW 06/12/2013, 4:00pm

## 2013-06-12 NOTE — Progress Notes (Signed)
Late entry for 06-11-2013 written on 06-12-2013   Psychoeducational Group Note  Date: 06/12/2013 Time:  1015  Group Topic/Focus:  Identifying Needs:   The focus of this group is to help patients identify their personal needs that have been historically problematic and identify healthy behaviors to address their needs.  Participation Level:  Active  Participation Quality:  Appropriate  Affect:  Appropriate  Cognitive:  Oriented  Insight:  Improving  Engagement in Group:  Engaged  Additional Comments:   Katelyn [name] 

## 2013-06-12 NOTE — Progress Notes (Signed)
Patient observed by writer up in the dayroom watching tv and interacting with select peers. She report having had a good day since sleeping well last night. She is hopeful to discharge on Monday and has plans for her mom/dad to help her with her 2 children for a while. She reports her anxiety has decreased also. She currently denies si/hi/a/v hallucinations. Writer encouraged her to continue improving and utilize her support system in place when in need. Safety maintained on unit with 15 min checks.

## 2013-06-12 NOTE — Progress Notes (Signed)
Psychoeducational Group Note  Date: 06/12/2013 Time:  0930 Group Topic/Focus:  Gratefulness:  The focus of this group is to help patients identify what two things they are most grateful for in their lives. What helps ground them and to center them on their work to their recovery.  Participation Level:  Active  Participation Quality:  Appropriate  Affect:  Appropriate  Cognitive:  Oriented  Insight:  Improving  Engagement in Group:  Engaged  Additional Comments:  participated in the group.  Joannah Gitlin A   

## 2013-06-13 ENCOUNTER — Ambulatory Visit: Payer: Self-pay | Admitting: Neurology

## 2013-06-13 MED ORDER — ZIPRASIDONE HCL 80 MG PO CAPS: 80.0000 mg | ORAL_CAPSULE | Freq: Two times a day (BID) | ORAL | Status: DC

## 2013-06-13 MED ORDER — FLUOXETINE HCL 40 MG PO CAPS: 40.0000 mg | ORAL_CAPSULE | Freq: Every day | ORAL | Status: DC

## 2013-06-13 NOTE — Progress Notes (Signed)
Discharge Note:  Patient discharged home.  Patient received all her belongings, clothing, toiletries, miscellaneous items, prescriptions, medications.  Patient denied SI and HI.  Denied A/V hallucinations.  Denied pain.  Suicide prevention information given and discussed with patient who stated she understood and had no questions.  Patient stated she appreciated all assistance received from [facility] staff.

## 2013-06-13 NOTE — BHH Suicide Risk Assessment (Signed)
[facility] INPATIENT:  Family/Significant Other Suicide Prevention Education  Suicide Prevention Education:  Contact Attempts: [name], Husband, [phone];  has been identified by the patient as the family member/significant other with whom the patient will be residing, and identified as the person(s) who will aid the patient in the event of a mental health crisis.  With written consent from the patient, two attempts were made to provide suicide prevention education, prior to and/or following the patient's discharge.  We were unsuccessful in providing suicide prevention education.  A suicide education pamphlet was given to the patient to share with family/significant other.  Date and time of first attempt: 06/13/13 - Message left on husband VM.  Patient discharged day of meeting with writer for the first time.  SPE has been done with husband during past hospital admissions.   Concha Pyo 06/13/2013, 3:01 PM

## 2013-06-13 NOTE — Tx Team (Signed)
Interdisciplinary Treatment Plan Update   Date Reviewed:  06/13/2013  Time Reviewed:  8:33 AM  Progress in Treatment:   Attending groups: Yes Participating in groups: Yes Taking medication as prescribed: Yes  Tolerating medication: Yes Family/Significant other contact made:  No, but consent was given by patient and message left on VM. Patient understands diagnosis: Yes  Discussing patient identified problems/goals with staff: Yes Medical problems stabilized or resolved: Yes Denies suicidal/homicidal ideation: Yes Patient has not harmed self or others: Yes  For review of initial/current patient goals, please see plan of care.  Estimated Length of Stay: Discharge today  Reasons for Continued Hospitalization:   New Problems/Goals identified:    Discharge Plan or Barriers:   Home with outpatient follow up with [facility]  Additional Comments:     Attendees:  Patient:  06/13/2013 8:33 AM   Signature: Mylinda Latina, MD 06/13/2013 8:33 AM  Signature:   06/13/2013 8:33 AM  Signature:  Catalina Pizza, NP 06/13/2013 8:33 AM  Signature:Beverly Danelle Earthly, RN 06/13/2013 8:33 AM  Signature:  Thurnell Garbe RN 06/13/2013 8:33 AM  Signature:  Joette Catching, LCSW 06/13/2013 8:33 AM  Signature:  Regan Lemming, LCSW 06/13/2013 8:33 AM  Signature:  Lucinda Dell, Care Coordinator [facility] 06/13/2013 8:33 AM  Signature:  Marshall Cork, RN 06/13/2013 8:33 AM  Signature:  06/13/2013  8:33 AM  Signature:   Lars Pinks, RN [facility] 06/13/2013  8:33 AM  ignature:   06/13/2013  8:33 AM    Scribe for Treatment Team:   Joette Catching,  06/13/2013 8:33 AM

## 2013-06-13 NOTE — Progress Notes (Signed)
Patient has been up and active on the unit, attended group this evening and has voiced no complaints. Patient currently denies having pain, -si/hi/a/v hall. She is hopeful to discharge on tomorrow and hopes to be at home with her children when it snows.  Support and encouragement offered, safety maintained on unit, will continue to monitor.
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
## 2013-06-13 NOTE — Progress Notes (Signed)
D:  Patient's self inventory sheet, patient sleeps well, good appetite, normal energy level, improving attention span.  Rated depression and hopeless 1, anxiety 2.  Denied withdrawals.  Denied SI.  Pain goal 3, zero pain.  "Have an action plan fix when I can't eat and need to take my geodon, liquid nutrition nausea meds.  I am ready for discharge today and need to leave before the weather arrives."  Does have discharge plans.  No problems taking meds after discharge. A:  Medications administered per MD orders.  Emotional support and encouragement given patient. R:  Denied SI and HI.  Denied A/V hallucinations.  Will continue to monitor patient for safety with 15 minute checks.  Safety maintained.

## 2013-06-13 NOTE — Discharge Summary (Signed)
Physician Discharge Summary Note  Patient:  Katelyn [name] is an 38 y.o., female MRN:  [id number] DOB:  1975-08-17 Patient phone:  [phone] (home)  Patient address:   [address] [place] [address],  Total Time spent with patient: Greater than 30 minutes Date of Admission:  06/09/2013 Date of Discharge: 06/13/2013  Reason for Admission:  MDD with SI w/plan  Discharge Diagnoses: Principal Problem:   Suicidal ideation Active Problems:   Depression, major, recurrent, severe with psychosis   Generalized anxiety disorder   Sleep disorder   Psychiatric Specialty Exam: Physical Exam  Review of Systems  Constitutional: Negative.   HENT: Negative.   Eyes: Negative.   Respiratory: Negative.   Cardiovascular: Negative.   Gastrointestinal: Negative.   Genitourinary: Negative.   Musculoskeletal: Negative.   Skin: Negative.   Neurological: Negative.   Endo/Heme/Allergies: Negative.   Psychiatric/Behavioral: Positive for depression. Negative for suicidal ideas and hallucinations. The patient is nervous/anxious. The patient does not have insomnia.     Blood pressure 113/79, pulse 83, temperature 97.5 F (36.4 C), temperature source Oral, resp. rate 16, height 5\' 6"  (1.676 m), weight 121.11 kg (267 lb).Body mass index is 43.12 kg/(m^2).  General Appearance: Casual  Eye Contact::  Good  Speech:  Clear and Coherent  Volume:  Normal  Mood:  Anxious  Affect:  Appropriate  Thought Process:  Coherent  Orientation:  Full (Time, Place, and Person)  Thought Content:  WDL  Suicidal Thoughts:  No  Homicidal Thoughts:  No  Memory:  Immediate;   Good Recent;   Good Remote;   Good  Judgement:  Fair  Insight:  Fair  Psychomotor Activity:  Normal  Concentration:  Good  Recall:  [place] of Knowledge:Good  Language: Good  Akathisia:  NA  Handed:  Right  AIMS (if indicated):     Assets:  Communication Skills Desire for Improvement Resilience  Sleep:  Number of Hours:  6.5     Musculoskeletal: Strength & Muscle Tone: within normal limits Gait & Station: normal Patient leans: N/A  DSM5: Depressive Disorders:  Major Depressive Disorder - Severe (296.23)  Axis Diagnosis:   AXIS I:  Generalized Anxiety Disorder and Major Depression, Recurrent severe AXIS II:  Deferred AXIS III:   Past Medical History  Diagnosis Date  . Obesity   . Skin lesion     Excisional biopsy  . Depression     several suicide attempts, hospitaluzed in 2012 for this  . Dizziness - light-headed   . Polycystic ovary   . Anxiety   . Chronic headaches   . IBS (irritable bowel syndrome)   . History of pneumonia     x 3  . Benign juvenile melanoma   . Colon polyps     found on colonoscopy 04/26/2012  . Sleep apnea     has lost 100lbs.   AXIS IV:  other psychosocial or environmental problems and problems related to social environment AXIS V:  61-70 mild symptoms  Level of Care:  OP  Hospital Course:   Katelyn [name] is a 38 YO married disabled caucasian female admitted voluntarily after she was referred by primary psychiatric therapies and physician from outpatient psychiatric services for increased symptoms of depression, anxiety, and suicidal ideation with the plan of overdosing on medication and has no intention. Patient reports having husband secure all medications in the home as she "did not trust herself today." Patient reports medications not working for last month and SI increasing. Patient Reported she  has at least 8 are more suicide attempt in the past. Patient has prior history of admits at [facility], most recently 07/05/12 and received two rounds, 6 treatment each, of ECT at [facility] in 2012.   During Hospitalization: Medications managed, psychoeducation, group and individual therapy. Pt currently denies SI, HI, and Psychosis. At discharge, pt rates anxiety at 1/10 and depression at 1/10. Pt states that she does have a good supportive home environment and will  followup with outpatient treatment. Affirms agreement with medication regimen and discharge plan. Denies other physical and psychological concerns at time of discharge.   Consults:  None  Significant Diagnostic Studies:  None  Discharge Vitals:   Blood pressure 113/79, pulse 83, temperature 97.5 F (36.4 C), temperature source Oral, resp. rate 16, height 5\' 6"  (1.676 m), weight 121.11 kg (267 lb). Body mass index is 43.12 kg/(m^2). Lab Results:   No results found for this or any previous visit (from the past 72 hour(s)).  Physical Findings: AIMS: Facial and Oral Movements Muscles of Facial Expression: None, normal Lips and Perioral Area: None, normal Jaw: None, normal Tongue: None, normal,Extremity Movements Upper (arms, wrists, hands, fingers): None, normal Lower (legs, knees, ankles, toes): None, normal, Trunk Movements Neck, shoulders, hips: None, normal, Overall Severity Severity of abnormal movements (highest score from questions above): None, normal Incapacitation due to abnormal movements: None, normal Patient's awareness of abnormal movements (rate only patient's report): No Awareness, Dental Status Current problems with teeth and/or dentures?: No Does patient usually wear dentures?: No  CIWA:  CIWA-Ar Total: 2 COWS:  COWS Total Score: 2  Psychiatric Specialty Exam: See Psychiatric Specialty Exam and Suicide Risk Assessment completed by Attending Physician prior to discharge.  Discharge destination:  Home  Is patient on multiple antipsychotic therapies at discharge:  No   Has Patient had three or more failed trials of antipsychotic monotherapy by history:  No  Recommended Plan for Multiple Antipsychotic Therapies: NA   Future Appointments Provider Department Dept Phone   06/21/2013 1:30 PM Fayrene Helper, MD [facility] [phone]   06/24/2013 10:45 AM Edward Jolly, MD [facility] [place] [phone]   07/19/2013 8:15 AM Fayrene Helper, MD [facility] [phone]   08/16/2013 8:45 AM Fayrene Helper, MD [facility] [phone]   09/13/2013 8:15 AM Fayrene Helper, MD [facility] [phone]   10/11/2013 8:00 AM Fayrene Helper, MD [place] [phone]       Medication List    ASK your doctor about these medications     Indication   buPROPion 150 MG 24 hr tablet  Commonly known as:  WELLBUTRIN XL  Take 1 tablet (150 mg total) by mouth every morning.      FLUoxetine 40 MG capsule  Commonly known as:  PROZAC  Take 40 mg by mouth every morning.      GLUCOPHAGE 500 MG tablet  Generic drug:  metFORMIN  Take 1 tablet (500 mg total) by mouth 2 (two) times daily with a meal.      multivitamin with minerals Tabs tablet  Take 1 tablet by mouth every morning.      phentermine 37.5 MG capsule  Take 37.5 mg by mouth daily before lunch.      temazepam 30 MG capsule  Commonly known as:  RESTORIL  Take 1 capsule (30 mg total) by mouth at bedtime.      topiramate 50 MG tablet  Commonly known as:  TOPAMAX  Take 1 tablet (50 mg total) by mouth 2 (two) times daily.      ziprasidone 40 MG capsule  Commonly known as:  GEODON  Take 40 mg by mouth every morning.      ziprasidone 80 MG capsule  Commonly known as:  GEODON  Take 80 mg by mouth at bedtime.            Follow-up Information   Follow up with [facility] On 06/20/2013. (Follow up with Dr. Adele Schilder at 10:00am for medications)    Contact information:   [address] [place], [place] [address] [phone]      Follow up with [facility] On 06/23/2013. (Appointment with [name] at 9:00am for continued therapy)    Contact information:   [place] Office:   [place] [place], [place] [address]  Phone: [phone]      Follow-up recommendations:  Activity:  As tolerated Diet:  Heart healthy with low sodium.  Comments:   Take all  medications as prescribed. Keep all follow-up appointments as scheduled.  Do not consume alcohol or use illegal drugs while on prescription medications. Report any adverse effects from your medications to your primary care provider promptly.  In the event of recurrent symptoms or worsening symptoms, call 911, a crisis hotline, or go to the nearest emergency department for evaluation.   Total Discharge Time:  Greater than 30 minutes.  Signed: Benjamine Mola, FNP-BC 06/13/2013, 1:52 PM  Patient was seen for psychiatric evaluation, suicide risk assessment and case discussed with her treatment team and physician extender. Disposition plan was made and reviewed the information documented and agree with the treatment plan.  Torrin Frein,JANARDHAHA R. 06/16/2013 1:38 PM

## 2013-06-13 NOTE — BHH Group Notes (Signed)
[facility] LCSW Aftercare Discharge Planning Group Note   06/13/2013 10:37 AM    Participation Quality:  Appropraite  Mood/Affect:  Appropriate  Depression Rating:  1  Anxiety Rating:  2  Thoughts of Suicide:  No  Will you contract for safety?   NA  Current AVH:  No  Plan for Discharge/Comments:  Patient attended discharge planning group and actively participated in group.  She hopes to discharge soon.  Patient to follow up with [facility].  CSW provided all participants with daily workbook.   Transportation Means: Patient has transportation.   Supports:  Patient has a support system.   Alyla Pietila, [place] Post

## 2013-06-15 ENCOUNTER — Ambulatory Visit (INDEPENDENT_AMBULATORY_CARE_PROVIDER_SITE_OTHER): Payer: Managed Care, Other (non HMO) | Admitting: Psychiatry

## 2013-06-15 ENCOUNTER — Ambulatory Visit (HOSPITAL_COMMUNITY): Payer: Self-pay | Admitting: Psychiatry

## 2013-06-15 ENCOUNTER — Encounter (HOSPITAL_COMMUNITY): Payer: Self-pay | Admitting: Psychiatry

## 2013-06-15 VITALS — BP 121/74 | HR 78 | Ht 65.0 in | Wt 268.0 lb

## 2013-06-15 DIAGNOSIS — F323 Major depressive disorder, single episode, severe with psychotic features: Secondary | ICD-10-CM

## 2013-06-15 DIAGNOSIS — G4700 Insomnia, unspecified: Secondary | ICD-10-CM

## 2013-06-15 DIAGNOSIS — F329 Major depressive disorder, single episode, unspecified: Secondary | ICD-10-CM

## 2013-06-15 MED ORDER — TEMAZEPAM 30 MG PO CAPS: 30.0000 mg | ORAL_CAPSULE | Freq: Every day | ORAL | Status: DC

## 2013-06-15 MED ORDER — ZIPRASIDONE HCL 80 MG PO CAPS: 80.0000 mg | ORAL_CAPSULE | Freq: Two times a day (BID) | ORAL | Status: DC
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
## 2013-06-16 NOTE — Progress Notes (Signed)
Patient Discharge Instructions:  Next Level Care Provider Has Access to the EMR, 06/16/13 Records provided to [facility] via [facility]/Epic access.  Patsey Berthold, 06/16/2013, 3:05 PM

## 2013-06-20 ENCOUNTER — Ambulatory Visit (HOSPITAL_COMMUNITY): Payer: Self-pay | Admitting: Psychiatry

## 2013-06-21 ENCOUNTER — Ambulatory Visit: Payer: Managed Care, Other (non HMO) | Admitting: Family Medicine

## 2013-06-22 DIAGNOSIS — E119 Type 2 diabetes mellitus without complications: Secondary | ICD-10-CM | POA: Diagnosis not present

## 2013-06-23 ENCOUNTER — Ambulatory Visit (HOSPITAL_COMMUNITY): Payer: Self-pay | Admitting: Psychiatry

## 2013-06-23 ENCOUNTER — Ambulatory Visit: Payer: Self-pay | Admitting: Family Medicine

## 2013-06-23 ENCOUNTER — Ambulatory Visit (HOSPITAL_COMMUNITY): Payer: Managed Care, Other (non HMO) | Admitting: Psychiatry

## 2013-06-24 ENCOUNTER — Ambulatory Visit (INDEPENDENT_AMBULATORY_CARE_PROVIDER_SITE_OTHER): Payer: Medicare Other | Admitting: General Surgery

## 2013-06-24 DIAGNOSIS — E282 Polycystic ovarian syndrome: Secondary | ICD-10-CM

## 2013-06-24 DIAGNOSIS — G4700 Insomnia, unspecified: Secondary | ICD-10-CM | POA: Diagnosis not present

## 2013-06-24 DIAGNOSIS — E8881 Metabolic syndrome: Secondary | ICD-10-CM

## 2013-06-24 DIAGNOSIS — F509 Eating disorder, unspecified: Secondary | ICD-10-CM

## 2013-06-24 DIAGNOSIS — Z01818 Encounter for other preprocedural examination: Secondary | ICD-10-CM

## 2013-06-24 DIAGNOSIS — Z8261 Family history of arthritis: Secondary | ICD-10-CM

## 2013-06-24 DIAGNOSIS — F411 Generalized anxiety disorder: Secondary | ICD-10-CM

## 2013-06-24 DIAGNOSIS — E119 Type 2 diabetes mellitus without complications: Secondary | ICD-10-CM

## 2013-06-24 NOTE — Progress Notes (Signed)
Subjective:   morbid obesity  Patient ID: Katelyn [name], female   DOB: [birth year], 38 y.o.   MRN: [id number]  HPI Katelyn [name] [facility] y.o.female referred by Dr. [name] for consideration for surgical treatment for morbid obesity.  She  gives a history of progressive obesity since her teenage years despite multiple attempts at supervised medical management.  her weight has been affecting her in a number of ways including development of metabolic syndrome and prediabetes, polycystic ovarian syndrome, chronic joint pain and stress urinary incontinence. She has difficulty participating in routine daily activities with her children..   She has been to our initial information seminar, researched surgical options thoroughly and is interested in gastric bypass.  She has significant psychiatric problems with depression and he is disabled because of this but at this point by her history has been cleared by her psychiatrist to be acceptable for surgery. She formerly worked as a Equities trader at [employer]. She has previously been diagnosed with mild sleep apnea not requiring CPAP. Her neurologist has recommended a repeat sleep study due to headaches.  Past Medical History  Diagnosis Date  . Obesity   . Skin lesion     Excisional biopsy  . Depression     several suicide attempts, hospitaluzed in 2012 for this  . Dizziness - light-headed   . Polycystic ovary   . Anxiety   . Chronic headaches   . IBS (irritable bowel syndrome)   . History of pneumonia     x 3  . Benign juvenile melanoma   . Colon polyps     found on colonoscopy 04/26/2012  . Sleep apnea     has lost 100lbs.   Past Surgical History  Procedure Laterality Date  . Tonsillectomy  age 46  . Cystoscopy with urethral dilatation  age 23   . Mass excision  infancy     neoplasm excised from spine  . Skin lesion excision      back  . Cesarean section  2004, 2007    x 2  . Mole excision         Review of Systems   Constitutional: Positive for fatigue.  HENT: Negative.   Respiratory: Negative.   Cardiovascular: Negative.   Gastrointestinal: Negative.        Mild GERD  Genitourinary:       Mild stress incontinence  Musculoskeletal: Positive for arthralgias and myalgias.  Psychiatric/Behavioral: Positive for suicidal ideas, sleep disturbance and dysphoric mood. The patient is nervous/anxious.        Objective:   Physical Exam BP 132/80  Pulse 74  Temp(Src) 98 F (36.7 C)  Resp 18  Ht 5' 5.5" (1.664 m)  Wt 261 lb (118.389 kg)  BMI 42.76 kg/m2 General: Alert, morbidly obese Caucasian female, in no distress Skin: Warm and dry without rash or infection. HEENT: No palpable masses or thyromegaly. Sclera nonicteric. Pupils equal round and reactive. Oropharynx clear. Lymph nodes: No cervical, supraclavicular, or inguinal nodes palpable. Lungs: Breath sounds clear and equal without increased work of breathing Cardiovascular: Regular rate and rhythm without murmur. No JVD or edema. Peripheral pulses intact. Abdomen: Nondistended. Soft and nontender. No masses palpable. No organomegaly. No palpable hernias. Extremities: No edema or joint swelling or deformity. No chronic venous stasis changes. Neurologic: Alert and fully oriented. Somewhat flat affect. Gait normal.    Assessment:     Patient with progressive morbid obesity unresponsive to multiple efforts at medical management who presents with a BMI  of 41 and comorbidities of early or prediabetes, stress urinary incontinence, joint pain, probable mildsleep apnea and polycystic ovarian syndrome.. I believe there would be very significant medical benefit from surgical weight loss.  She has very significant problems with depression but currently is being monitored closely by her psychiatrist who feels that she is stable for surgery. After our discussion of surgical options currently available the patient has decided to proceed with laparoscopic Roux-en-Y  gastric bypass due to the reliability of weight loss and direct affect on metabolic syndrome.. We have discussed the nature of the problem and the risks of remaining morbidly obese. We discussed laparoscopic Roux-en-Y gastric bypass in detail including the nature of the procedure, expected hospitalization and recovery, and major risks of anesthetic complications, bleeding, blood clots and pulmonary emboli, leakage and infection and long-term risks of stricture, ulceration, bowel obstruction, nutritional deficiencies, and failure to lose weight or weight regain. We discussed these problems could lead to death. The patient was given a complete consent form to review and all questions were answered. We will go ahead with preoperative including psychological and nutrition evaluations, H. pylori testing, ultrasound, bone density, and routine lab and x-rays. I will see the patient back following these studies.    Plan:     Begin preop workup for gastric bypass as above

## 2013-06-25 ENCOUNTER — Emergency Department (INDEPENDENT_AMBULATORY_CARE_PROVIDER_SITE_OTHER)
Admission: EM | Admit: 2013-06-25 | Discharge: 2013-06-25 | Disposition: A | Discharge status: Home / Self Care | Payer: Managed Care, Other (non HMO) | Source: Home / Self Care | Attending: Emergency Medicine | Admitting: Emergency Medicine

## 2013-06-25 ENCOUNTER — Encounter (HOSPITAL_COMMUNITY): Payer: Self-pay | Admitting: Emergency Medicine

## 2013-06-25 DIAGNOSIS — L0291 Cutaneous abscess, unspecified: Secondary | ICD-10-CM | POA: Diagnosis not present

## 2013-06-25 DIAGNOSIS — L0390 Cellulitis, unspecified: Secondary | ICD-10-CM

## 2013-06-25 MED ORDER — SULFAMETHOXAZOLE-TMP DS 800-160 MG PO TABS: 2.0000 | ORAL_TABLET | Freq: Two times a day (BID) | ORAL | Status: DC

## 2013-06-25 NOTE — ED Provider Notes (Signed)
  Chief Complaint   Chief Complaint  Patient presents with  . Abscess    History of Present Illness   Katelyn [name] is a 38 year old female who has had a 2 to three-week history of a painful abscess on her left lateral neck. This has drained some pus. She denies any fever or chills. No prior history of abscesses or skin infections.  Review of Systems   Other than as noted above, the patient denies any of the following symptoms: Systemic:  No fever, chills or sweats. Skin:  No rash or itching.  [place]   Past medical history, family history, social history, meds, and allergies were reviewed.  No history of diabetes or prior history of abscesses or MRSA. She's allergic to penicillin. Current meds include Wellbutrin, Prozac, metformin, phentermine, Restoril, Topamax, and Geodon. She has a history of polycystic ovarian syndrome and depression.  Physical Examination     Vital signs:  BP 143/87  Pulse 99  Temp(Src) 99 F (37.2 C) (Oral)  Resp 20  SpO2 100% Skin:  There is a 1.5 cm, tender abscess on the left lateral neck area. This was not draining any pus.  Skin exam was otherwise normal.  No rash. Ext:  Distal pulses were full, patient has full ROM of all joints.  Procedure   Verbal informed consent was obtained.  The patient was informed of the risks and benefits of the procedure and understands and accepts.  A time out was called and the identity of the patient and correct procedure was confirmed.   The abscess area described above was prepped with Betadine and alcohol and anesthetized with ethyl chloride spray and 5 mL of 2% Xylocaine with epinephrine.  Using a #11 scalpel blade, a singe straight incision was made into the area of fluctulence, yielding a moderate amount of prurulent drainage and squamous debris.  Routine cultures were obtained.  Blunt dissection was used to break up loculations and the resulting wound cavity was packed with 1/4 inch Iodoform gauze.  A sterile  pressure dressing was applied.  Assessment   The encounter diagnosis was Abscess.  Probably secondary to infected sebaceous cyst.  Plan     1.  Meds:  The following meds were prescribed:   Discharge Medication List as of 06/25/2013  1:38 PM    START taking these medications   Details  sulfamethoxazole-trimethoprim (BACTRIM DS) 800-160 MG per tablet Take 2 tablets by mouth 2 (two) times daily., Starting 06/25/2013, Until Discontinued, Normal        2.  Patient Education/Counseling:  The patient was given appropriate handouts, self care instructions, and instructed in symptomatic relief.    3.  Follow up:  The patient was instructed to leave the dressing in place and return here again in 48 hours for packing removal, or sooner if becoming worse in any way, and given some red flag symptoms such as fever which would prompt immediate return.       Harden Mo, MD 06/25/13 [phone]

## 2013-06-25 NOTE — Discharge Instructions (Signed)
You have had an abscess drained.  An abscess is a collection of pus caused by infection with skin bacteria such as Streptococcus or Staphylococcus.  Since this is and infection, you may be contagious.  For the first 2 days, leave the dressing in place and keep it clean and dry. This means you should not get it wet.  You will have to take a sponge bath rather than a shower.  If the abscess was packed, we may instruct you to come back in 2 to 3 days to have the packing removed.  If the abscess was not packed, you may remove the dressing yourself in 2 days and take care of the wound yourself.  After the packing is out, change the dressing at least once a day.  You may bathe or shower once the packing has been removed.  Assemble all the dressing material before you change the dressing, wear gloves, dispose of the soiled dressing material well and wash your hands before and after changing the dressing.  Wash the area well with soap and water, taking care to remove all the dried blood and drainage.  Apply a thin layer of antibiotic ointment (Bacitracin or Polysporin) around the abscess cavity, then apply a gauze dressing.  You may want to use a non-adherent dressing like Telfa.  Fasten this in place well with tape.  Continue to change the dressing until there is no further drainage.  Finish up the entire prescription of any antibiotics that you have been given.  Take infectious precautions since the bacteria that cause these abscesses may be contagious.  Wash hands frequently or use hand sanitizer, especially after touching the abscess area or changing dressings.  Do not allow anyone else to use your towel or washcloth and wash these items after each use until the abscess has healed.  You may want to use an antibacterial soap such as Dial or Safeguard or a prescription body wash like Hibiclens.  You also may want to consider spraying the tub or shower with a disinfectant such a Lysol until the abscess has  healed.  Things that should prompt you to return to the office for a recheck include:  Fever over 100 degrees, increasing pain or drainage, failure of the abscess to heal after 10 days, or other skin lesions elsewhere.   

## 2013-06-25 NOTE — ED Notes (Signed)
Pt. Stated, I've had a bump that just got bigger in the last 2 weeks, now its so painful I can;'t sleep.

## 2013-06-27 ENCOUNTER — Encounter (HOSPITAL_COMMUNITY): Payer: Self-pay | Admitting: Emergency Medicine

## 2013-06-27 ENCOUNTER — Emergency Department (INDEPENDENT_AMBULATORY_CARE_PROVIDER_SITE_OTHER)
Admission: EM | Admit: 2013-06-27 | Discharge: 2013-06-27 | Disposition: A | Discharge status: Home / Self Care | Payer: Managed Care, Other (non HMO) | Source: Home / Self Care | Attending: Emergency Medicine | Admitting: Emergency Medicine

## 2013-06-27 DIAGNOSIS — Z4801 Encounter for change or removal of surgical wound dressing: Secondary | ICD-10-CM

## 2013-06-27 DIAGNOSIS — L0291 Cutaneous abscess, unspecified: Secondary | ICD-10-CM

## 2013-06-27 NOTE — ED Provider Notes (Signed)
  Chief Complaint   Chief Complaint  Patient presents with  . Suture / Staple Removal    History of Present Illness   Katelyn [name] is a 38 year old female who was here 2 days ago for an abscess on the left side of her neck. This was incised, drained, and packed. She returns today for packing removal. She has only minimal pain. No fever or chills. So far the cultures growing out gram-negative rods, no identification or sensitivities yet.  Review of Systems   Other than as noted above, the patient denies any of the following symptoms: Systemic:  No fever, chills or sweats. Skin:  No rash or itching.  [place]   Past medical history, family history, social history, meds, and allergies were reviewed.  No history of diabetes or prior history of abscesses or MRSA.  Physical Examination     Vital signs:  BP 133/88  Pulse 80  Temp(Src) 98.9 F (37.2 C) (Oral)  Resp 16  SpO2 100%  LMP 06/17/2013 Skin:  There is an abscess on the left lateral neck which has been incised and drained and packing is in place. There is no surrounding erythema, induration, or tenderness to palpation.  Skin exam was otherwise normal.  No rash. Ext:  Distal pulses were full, patient has full ROM of all joints.  Procedure   Verbal informed consent was obtained.  The patient was informed of the risks and benefits of the procedure and understands and accepts.  A time out was called and the identity of the patient and correct procedure was confirmed. The packing was removed. I was able to express a small amount of yellow pus thereafter. Otherwise the wound cavity appears normal. The wound was cleansed with saline, antibiotic ointment was applied followed by a clean, dry dressing.  Assessment   The encounter diagnosis was Abscess.  Plan     1.  Meds:  The following meds were prescribed:   New Prescriptions   No medications on file    2.  Patient Education/Counseling:  The patient was given appropriate  handouts, self care instructions, and instructed in symptomatic relief.  Instructed in wound care.  3.  Follow up:  The patient was instructed to return if becoming worse in any way, and given some red flag symptoms such as fever which would prompt immediate return.       Harden Mo, MD 06/27/13 [phone]

## 2013-06-27 NOTE — ED Notes (Signed)
Pt here for packing removal.  Voices no concerns at this time.

## 2013-06-27 NOTE — Discharge Instructions (Signed)
Now that the packing has been removed from your abscess, you may bathe and shower as normal.  You should change the dressing at least once a day.  You may change it more often if it becomes soiled with drainage.  Wash the wound well with soap and water, pat dry, apply an antibiotic ointment (Neosporin, Polysporin, or Bacitracin are all OK), then cover with a non-stick dressing such as Telfa with several layers of plain gause over that to absorb drainage.  You may secure this in place with tape or with roll gauze and tape.  Keep the wound covered for until it stops draining.  This usually takes 7 days, but may be longer.  Return for a recheck if you have heavy bleeding, increasing pain, fever, or the wound looks worse.  ° °

## 2013-06-28 ENCOUNTER — Ambulatory Visit (INDEPENDENT_AMBULATORY_CARE_PROVIDER_SITE_OTHER): Payer: Managed Care, Other (non HMO) | Admitting: Family Medicine

## 2013-06-28 ENCOUNTER — Encounter: Payer: Self-pay | Admitting: Family Medicine

## 2013-06-28 DIAGNOSIS — R45851 Suicidal ideations: Secondary | ICD-10-CM

## 2013-06-28 DIAGNOSIS — L0291 Cutaneous abscess, unspecified: Secondary | ICD-10-CM | POA: Diagnosis not present

## 2013-06-28 DIAGNOSIS — G4700 Insomnia, unspecified: Secondary | ICD-10-CM

## 2013-06-28 DIAGNOSIS — L0390 Cellulitis, unspecified: Secondary | ICD-10-CM | POA: Diagnosis not present

## 2013-06-28 LAB — CULTURE, ROUTINE-ABSCESS
Culture: NO GROWTH
Special Requests: NORMAL

## 2013-06-28 NOTE — Progress Notes (Signed)
Subjective:    Patient ID: Katelyn [name], female    DOB: 08/15/1975, 38 y.o.   MRN: [id number]  HPI Pt in for follow up of chronic  Medical problems , in particular monthly evaluation of morbid obesity. Hopes to qualify for surgical intervention. Since last visit , she was hospitalized for suicide attempt, states she always has these events perimenstrualy, and I have advised that she discuss hormonal manipulation by her gynecologist to minimize changes in her hormonal environment in the hope that she will be n medically stable and have no suicidal plans. Weight is essentially is unchanged in the past month, but she did have excessive weight loss the month before. He is engaged in support group on line, also using "my fitness pal", uses a pedometer , and is also linked with a weight loss counselor through her ins company and is also interested on individual weight loss counseling at the hospital in person She remains highly motivated to losing weight and becoming healthier as a result. Will discuss the need for sleep study again with her neurologist, has not yet had appt she requested previously with the local neurologist  Was recently in ED for abscess  On left neck which was incised, will need f/o on c/s    Review of Systems See HPI Denies recent fever or chills. Denies sinus pressure, nasal congestion, ear pain or sore throat. Denies chest congestion, productive cough or wheezing. Denies chest pains, palpitations and leg swelling Denies abdominal pain, nausea, vomiting,diarrhea or constipation.   Denies dysuria, frequency, hesitancy or incontinence. Denies joint pain, swelling and limitation in mobility. Denies headaches, seizures, numbness, or tingling.          Objective:   Physical Exam BP 114/70  Pulse 86  Resp 16  Ht 5' 5.5" (1.664 m)  Wt 267 lb 12.8 oz (121.473 kg)  BMI 43.87 kg/m2  SpO2 97%  LMP 06/17/2013 Patient alert and oriented and in no cardiopulmonary  distress.  HEENT: No facial asymmetry, EOMI, no sinus tenderness,  oropharynx pink and moist.  Neck supple no adenopathy.Incision site on left neck healing well, no excessive erythema or drainage noted  Chest: Clear to auscultation bilaterally.  CVS: S1, S2 no murmurs, no S3.  ABD: Soft non tender. Bowel sounds normal.  Ext: No edema  MS: Adequate ROM spine, shoulders, hips and knees.  Skin: Recent incision healing well with no obvious superinfection Psych: Good eye contact, normal affect. Memory intact not anxious or depressed appearing.  CNS: CN 2-12 intact, power, tone and sensation normal throughout.        Assessment & Plan:  Morbid obesity Unchanged, resume half phentermine daily, support by telephone, inter net and in person re food intake as well as regular exercise to facilitate weight loss Patient re-educated about  the importance of commitment to a  minimum of 150 minutes of exercise per week. The importance of healthy food choices with portion control discussed. Encouraged to start a food diary, count calories and to consider  joining a support group. Sample diet sheets offered. Goals set by the patient for the next one  month.     Suicidal ideation Recently hospitalized, since last visit in the past month for suicide attempt, which she relates to variations in female hormones with menstrual cycle. To discuss this further with her gyne Followed closely by psychiatry and therapist, not suicidal currently  Insomnia Controlled, no change in medication   Cellulitis and abscess Healing well following recent I/D in  the urgent care setting, will f/u on wound c/s result

## 2013-06-28 NOTE — Patient Instructions (Addendum)
F/u in 4 week, call if you need me before  Resume phentermine one half daily, you don't need any new script at this time)  Continue with therapist and get good rest   Send me message in 2 days re culture report from abcess

## 2013-06-30 ENCOUNTER — Ambulatory Visit (INDEPENDENT_AMBULATORY_CARE_PROVIDER_SITE_OTHER): Payer: Medicare Other | Admitting: Neurology

## 2013-06-30 ENCOUNTER — Encounter: Payer: Self-pay | Admitting: Neurology

## 2013-06-30 VITALS — BP 90/68 | HR 82 | Temp 98.5°F | Ht 66.14 in | Wt 265.5 lb

## 2013-06-30 DIAGNOSIS — R51 Headache: Secondary | ICD-10-CM

## 2013-06-30 DIAGNOSIS — R519 Headache, unspecified: Secondary | ICD-10-CM

## 2013-06-30 MED ORDER — TOPIRAMATE 50 MG PO TABS: 50.0000 mg | ORAL_TABLET | Freq: Two times a day (BID) | ORAL | Status: DC

## 2013-06-30 NOTE — Patient Instructions (Signed)
1.  Continue topamax 50mg  twice daily 2.  I'll see you back in 79-months, or sooner as needed

## 2013-06-30 NOTE — Progress Notes (Signed)
Follow-up Visit   Date: 06/30/2013    Katelyn [name] MRN: [id number] DOB: 1975/05/18   Interim History: Katelyn [name] is a 38 y.o. right-handed Caucasian female with history of history of diabetes, polycystic ovarian cystic syndrome, depression with psychosis, and mild OSA (not on CPAP) returning to the clinic for follow-up of chronic daily headaches.  The patient was accompanied to the clinic by self.  She was last seen in the clinic on 04/11/2013.  History of present illness: Headaches started around 2012 after her ECT therapy (total of 12 sessions). She has no prior history of migraines or severe headaches prior to this. Headaches are described as "tightness, pressure" over the forehead. She has occasional radiation of pain to the back of her head. Headache is daily (ranked as 3/10), with varying intensity throughout the day. It is worsened by loud noises and stress. When severe, she has photophobia, nausea, and vomiting. Duration is 5-6 hours. Severe headaches occur once every 1-2 months. She is able to function with daily headaches, but with severe pain, she has to lay down. She has tried hot and cold compresses, ibuprofen, excedrin migraine, tylenol, and Aleve which does not help. She does not take anything for them anymore because nothing works. She was recently started on fioricet which helped when she took two.  She reports snoring loudly and feeling tired in the morning. She does not get restful sleep. Restoril was started for insomnia which helped her get 6 hours of uninterrupted sleep, but this is started to wear off.   Her psychiatrist also tried topamax 50mg  but there was no change in symptoms. Of all the things she has tried, she has felt the most relief with hot/cold compresses.  Of note, she takes ziprasidone 40 MG for depression with psychosis for a number of years and has generalized tremors.   - Follow-up 06/30/2013:  She reports having significant improvement after  increasing topamax to 100mg /day, especially with migraines. Last migraine was a month ago.  She continues to have low-grade daily headache, but this also improved because it is tolerable now. She is now able to sleep through the night and denies any morning headache.  She reports having psychiatric hospitalization January 2015 for suicidal ideation and impulsive thoughts.   Denies any side effects or plans for pregnancy.    Medications:  Current Outpatient Prescriptions on File Prior to Visit  Medication Sig Dispense Refill  . buPROPion (WELLBUTRIN XL) 150 MG 24 hr tablet       . FLUoxetine (PROZAC) 40 MG capsule Take 1 capsule (40 mg total) by mouth daily.  30 capsule  0  . metFORMIN (GLUCOPHAGE) 500 MG tablet Take 1 tablet (500 mg total) by mouth 2 (two) times daily with a meal.  60 tablet  11  . Multiple Vitamin (MULTIVITAMIN WITH MINERALS) TABS tablet Take 1 tablet by mouth every morning.      . phentermine 37.5 MG capsule Take 37.5 mg by mouth daily before lunch.      . sulfamethoxazole-trimethoprim (BACTRIM DS) 800-160 MG per tablet Take 2 tablets by mouth 2 (two) times daily.  40 tablet  0  . temazepam (RESTORIL) 30 MG capsule Take 1 capsule (30 mg total) by mouth at bedtime.  30 capsule  0  . topiramate (TOPAMAX) 50 MG tablet Take 1 tablet (50 mg total) by mouth 2 (two) times daily.  60 tablet  6  . ziprasidone (GEODON) 80 MG capsule Take 1 capsule (80 mg total)  by mouth 2 (two) times daily with a meal.  180 capsule  0   No current facility-administered medications on file prior to visit.    Allergies:  Allergies  Allergen Reactions  . Lithium     Catatonic state  . Penicillins     REACTION: Rash     Review of Systems:  CONSTITUTIONAL: No fevers, chills, night sweats, + intentional weight loss.   EYES: No visual changes or eye pain ENT: No hearing changes.  No history of nose bleeds.   RESPIRATORY: No cough, wheezing and shortness of breath.   CARDIOVASCULAR: Negative for  chest pain, and palpitations.   GI: Negative for abdominal discomfort, blood in stools or black stools.  No recent change in bowel habits.   GU:  No history of incontinence.   MUSCLOSKELETAL: No history of joint pain or swelling.  No myalgias.   SKIN: Negative for lesions, rash, and itching.   ENDOCRINE: Negative for cold or heat intolerance, polydipsia or goiter.   PSYCH:  + depression or anxiety symptoms.   NEURO: As Above.   Vital Signs:  BP 90/68  Pulse 82  Temp(Src) 98.5 F (36.9 C)  Ht 5' 6.14" (1.68 m)  Wt 265 lb 8 oz (120.43 kg)  BMI 42.67 kg/m2  SpO2 95%  LMP 06/17/2013  Neurological Exam: MENTAL STATUS including orientation to time, place, person, recent and remote memory, attention span and concentration, language, and fund of knowledge is normal.  Speech is not dysarthric.  CRANIAL NERVES: Pupils equal round and reactive to light.  Normal conjugate, extra-ocular eye movements in all directions of gaze.  No ptosis. Face is symmetric. Palate elevates symmetrically.  Tongue is midline and tremulous.  Occasional involuntary movements of the face is seen.  MOTOR:  Motor strength is 5/5 in all extremities. Intermittent mild bilateral hand intention tremors.  Tone is normal.  MSRs:  Reflexes are 2+/4 throughout.  SENSORY:  Intact to vibration throughout.  COORDINATION/GAIT:  Intact rapid alternating movements bilaterally.  Gait narrow based and stable.   Data: Lab Results  Component Value Date   [id number] 010 04/11/2013   Lab Results  Component Value Date   TSH 0.79 04/11/2013   MRI brain 12/2010: Normal examination. No pituitary macroadenoma. No diagnosable microadenoma.    IMPRESSION/PLAN: 1.  Chronic daily headaches  - Clinically improved after starting TPM  - Continue current dose of topiramate 50mg  BID 2.  Migraine without aura  - much improved! 3.  Depression with psychosis, followed by Dr. Adele Schilder 4.  Return to clinic in 33-months    The duration of  this appointment visit was 20 minutes of face-to-face time with the patient.  Greater than 50% of this time was spent in counseling, explanation of diagnosis, planning of further management, and coordination of care.   Thank you for allowing me to participate in patient's care.  If I can answer any additional questions, I would be pleased to do so.    Sincerely,    Nura Cahoon K. Posey Pronto, DO

## 2013-07-01 DIAGNOSIS — F411 Generalized anxiety disorder: Secondary | ICD-10-CM | POA: Diagnosis not present

## 2013-07-01 DIAGNOSIS — G4700 Insomnia, unspecified: Secondary | ICD-10-CM | POA: Diagnosis not present

## 2013-07-01 DIAGNOSIS — E8881 Metabolic syndrome: Secondary | ICD-10-CM | POA: Diagnosis not present

## 2013-07-01 DIAGNOSIS — Z01818 Encounter for other preprocedural examination: Secondary | ICD-10-CM | POA: Diagnosis not present

## 2013-07-01 DIAGNOSIS — E119 Type 2 diabetes mellitus without complications: Secondary | ICD-10-CM | POA: Diagnosis not present

## 2013-07-01 DIAGNOSIS — F509 Eating disorder, unspecified: Secondary | ICD-10-CM | POA: Diagnosis not present

## 2013-07-01 DIAGNOSIS — E282 Polycystic ovarian syndrome: Secondary | ICD-10-CM | POA: Diagnosis not present

## 2013-07-01 LAB — LIPID PANEL
CHOLESTEROL: 166 mg/dL (ref 0–200)
HDL: 46 mg/dL (ref >39)
LDL CALC: 101 mg/dL — AB (ref 0–99)
Total CHOL/HDL Ratio: 3.6 Ratio
Triglycerides: 95 mg/dL (ref <150)
VLDL: 19 mg/dL (ref 0–40)

## 2013-07-01 LAB — CBC WITH DIFFERENTIAL/PLATELET
BASOS PCT: 0 % (ref 0–1)
Basophils Absolute: 0 10*3/uL (ref 0.0–0.1)
EOS ABS: 0.1 10*3/uL (ref 0.0–0.7)
Eosinophils Relative: 1 % (ref 0–5)
HCT: 40.1 % (ref 36.0–46.0)
Hemoglobin: 13.7 g/dL (ref 12.0–15.0)
Lymphocytes Relative: 29 % (ref 12–46)
Lymphs Abs: 2.1 10*3/uL (ref 0.7–4.0)
MCH: 30.7 pg (ref 26.0–34.0)
MCHC: 34.2 g/dL (ref 30.0–36.0)
MCV: 89.9 fL (ref 78.0–100.0)
Monocytes Absolute: 0.4 10*3/uL (ref 0.1–1.0)
Monocytes Relative: 6 % (ref 3–12)
NEUTROS ABS: 4.5 10*3/uL (ref 1.7–7.7)
NEUTROS PCT: 64 % (ref 43–77)
Platelets: 388 10*3/uL (ref 150–400)
RBC: 4.46 MIL/uL (ref 3.87–5.11)
RDW: 14.9 % (ref 11.5–15.5)
WBC: 7.1 10*3/uL (ref 4.0–10.5)

## 2013-07-01 LAB — COMPREHENSIVE METABOLIC PANEL
ALBUMIN: 4.3 g/dL (ref 3.5–5.2)
ALK PHOS: 95 U/L (ref 39–117)
ALT: 26 U/L (ref 0–35)
AST: 21 U/L (ref 0–37)
BILIRUBIN TOTAL: 0.4 mg/dL (ref 0.2–1.2)
BUN: 13 mg/dL (ref 6–23)
CO2: 23 mEq/L (ref 19–32)
Calcium: 9 mg/dL (ref 8.4–10.5)
Chloride: 105 mEq/L (ref 96–112)
Creat: 0.93 mg/dL (ref 0.50–1.10)
Glucose, Bld: 88 mg/dL (ref 70–99)
POTASSIUM: 4.1 meq/L (ref 3.5–5.3)
SODIUM: 137 meq/L (ref 135–145)
TOTAL PROTEIN: 7 g/dL (ref 6.0–8.3)

## 2013-07-01 LAB — HEMOGLOBIN A1C
Hgb A1c MFr Bld: 5.2 % (ref <5.7)
Mean Plasma Glucose: 103 mg/dL (ref <117)

## 2013-07-02 LAB — TSH: TSH: 1.485 u[IU]/mL (ref 0.350–4.500)

## 2013-07-02 LAB — T4: T4, Total: 9.3 ug/dL (ref 5.0–12.5)

## 2013-07-06 ENCOUNTER — Telehealth: Payer: Self-pay | Admitting: Family Medicine

## 2013-07-06 NOTE — Telephone Encounter (Signed)
Fine please cancel

## 2013-07-07 ENCOUNTER — Ambulatory Visit (INDEPENDENT_AMBULATORY_CARE_PROVIDER_SITE_OTHER): Payer: Medicare Other | Admitting: Psychiatry

## 2013-07-07 ENCOUNTER — Encounter (HOSPITAL_COMMUNITY): Payer: Self-pay | Admitting: Psychiatry

## 2013-07-07 DIAGNOSIS — F333 Major depressive disorder, recurrent, severe with psychotic symptoms: Secondary | ICD-10-CM | POA: Diagnosis not present

## 2013-07-07 DIAGNOSIS — F411 Generalized anxiety disorder: Secondary | ICD-10-CM

## 2013-07-07 NOTE — Progress Notes (Signed)
   THERAPIST PROGRESS NOTE  Session Time: 10:00-10:50 am  Participation Level: Active  Behavioral Response: Casual, Neat and Well GroomedAlertAnxious  Type of Therapy: Individual Therapy  Treatment Goals addressed: Depression and Anxiety  Interventions: CBT, Solution Focused and Supportive  Summary: Katelyn [name] is a 38 y.o. female who presents with severe anxious mood and affect and denies depression. Pt said she was hospitalized for her depression and paranoia by Dr. Adele Schilder the last day she saw Probation officer. Pt said she contacted his office and he made the decision to refer Pt to [facility] inpatient [facility]. Pt said they made some medication changes and she also feels the psychosis was linked to the start of her cycle. Pt agrees to start tracking that on an app on her phone. Pt said she does not have any current depression or paranoia but has very high anxiety with jittery feelings. Pt said she is aware that she lacks insight into enacting her wellness plan when she gets sick and agreed to take the WRAP criss plan home today to complete it and give a copy to her dad so the next time she is depressed we have a concrete plan of intervention. Pt said she will talk to Dr. Adele Schilder next week about the possibility of ECT vs [place] as possible next treatments for depression flare ups. Pt appears to be a her highest level of baseline functioning. Pt said she is no longer paranoid about her family and her husband and is being productive around the home and leaving her house to go shopping and run errands.   Suicidal/Homicidal: No  Therapist Response: Assessed overall level of functioning per Pt self report. Discussed recent inpatient hospitalization and the benefits to treatment. Educated Pt on her resistance to treatment when she is sick an enforced the need for a written crisis plan to be completed a copy given to her father, Explored ECT and [place] to discuss with Dr. Adele Schilder at her next session, reviewed mood  stability and encouraged resuming coping skills and daily mantenance list activities for continued wellness.   Plan: Return again in 2 weeks.  Diagnosis: Axis I: Depression Major Recurent Severe with Psychosis and Generalized Anxiety Disorder      Tresa Res, LCSW 07/07/2013

## 2013-07-10 DIAGNOSIS — L0390 Cellulitis, unspecified: Secondary | ICD-10-CM

## 2013-07-10 DIAGNOSIS — L0291 Cutaneous abscess, unspecified: Secondary | ICD-10-CM | POA: Insufficient documentation

## 2013-07-10 NOTE — Assessment & Plan Note (Signed)
Healing well following recent I/D in the urgent care setting, will f/u on wound c/s result

## 2013-07-10 NOTE — Assessment & Plan Note (Signed)
Controlled, no change in medication  

## 2013-07-10 NOTE — Assessment & Plan Note (Signed)
Recently hospitalized, since last visit in the past month for suicide attempt, which she relates to variations in female hormones with menstrual cycle. To discuss this further with her gyne Followed closely by psychiatry and therapist, not suicidal currently

## 2013-07-10 NOTE — Assessment & Plan Note (Signed)
Unchanged, resume half phentermine daily, support by telephone, inter net and in person re food intake as well as regular exercise to facilitate weight loss Patient re-educated about  the importance of commitment to a  minimum of 150 minutes of exercise per week. The importance of healthy food choices with portion control discussed. Encouraged to start a food diary, count calories and to consider  joining a support group. Sample diet sheets offered. Goals set by the patient for the next one  month.

## 2013-07-13 ENCOUNTER — Ambulatory Visit (INDEPENDENT_AMBULATORY_CARE_PROVIDER_SITE_OTHER): Payer: Managed Care, Other (non HMO) | Admitting: Psychiatry

## 2013-07-13 ENCOUNTER — Encounter (HOSPITAL_COMMUNITY): Payer: Self-pay | Admitting: Psychiatry

## 2013-07-13 VITALS — BP 118/79 | HR 80 | Ht 65.5 in | Wt 268.6 lb

## 2013-07-13 DIAGNOSIS — F29 Unspecified psychosis not due to a substance or known physiological condition: Secondary | ICD-10-CM

## 2013-07-13 DIAGNOSIS — F329 Major depressive disorder, single episode, unspecified: Secondary | ICD-10-CM

## 2013-07-13 MED ORDER — BENZTROPINE MESYLATE 0.5 MG PO TABS: 0.5000 mg | ORAL_TABLET | Freq: Two times a day (BID) | ORAL | Status: DC

## 2013-07-13 MED ORDER — TEMAZEPAM 30 MG PO CAPS: 30.0000 mg | ORAL_CAPSULE | Freq: Every day | ORAL | Status: DC
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
## 2013-07-19 ENCOUNTER — Ambulatory Visit: Payer: Managed Care, Other (non HMO) | Admitting: Family Medicine

## 2013-07-21 ENCOUNTER — Encounter (HOSPITAL_COMMUNITY): Payer: Self-pay | Admitting: *Deleted

## 2013-07-21 ENCOUNTER — Ambulatory Visit (INDEPENDENT_AMBULATORY_CARE_PROVIDER_SITE_OTHER): Payer: Managed Care, Other (non HMO) | Admitting: Psychiatry

## 2013-07-21 DIAGNOSIS — F333 Major depressive disorder, recurrent, severe with psychotic symptoms: Secondary | ICD-10-CM | POA: Diagnosis not present

## 2013-07-21 DIAGNOSIS — F411 Generalized anxiety disorder: Secondary | ICD-10-CM

## 2013-07-21 NOTE — Progress Notes (Signed)
   THERAPIST PROGRESS NOTE  Session Time: 10:00-10:50 am  Participation Level: Active  Behavioral Response: CasualAlertDepressed  Type of Therapy: Individual Therapy  Treatment Goals addressed: Depression and anxiety  Interventions: CBT, Strength-based and Supportive  Summary: Katelyn [name] is a 38 y.o. female who presents with mild depressed mood and minimal anxiety. Pt reports doing "well" and said it has been a stable two weeks. Pt said she was anxious at her last appointment and her mood has settled down into a more depressed state, but Pt said it is minimal and still allows her to function around the house and accomplish weekly tasks.  Pt said she was very excited to Occupational psychologist that she had great news about her daughter [name]) sleeping in her own room and in her own bed consistently for the past two weeks. Pt said she has enjoyed having her own bed back and is so happy that this has finally worked out. Pt has expressed concerns about the kids sleeping in her bed since Pt started therapy with Probation officer. Pt said she has been consistently reinforcing her daughters positive behaviors and she has received a reward at the end of each week for sleeping in her own room. Pt saw this as a great success. Pt showed writer her crisis plan that she worked on since last session and was proud of the detail in which she completed it. Pt reviewed the plan with writer and discussed it in depth. Pt said she already discussed it with her husband and her parents and feels ready to enact it if necessary. Pt also agreed to make daily mood tracking more of a priority and asked writer to start session with this each week to hold her accountable. Pt said this should start at the next session.   Suicidal/Homicidal: No  Therapist Response: Assessed overall level of depression symptoms per Pt self report. Provided support and encouragement for Pt getting her daughter to sleep in her own room. Used cbt to explore how  Pt went about making that change and reinforcing it. reviewed crisis management plan that Pt created and discussed how writer can be supportive with implementation, encouraged daily mood tracking by Pt. Writer agreed to start each session with reviewing int.   Plan: Return again in 2 weeks. Start session by reviewing mood tracker to assess past mood and direct session.   Diagnosis: Axis I: Depression Major Recurrent Severe with Psychosis and Generalized Anxiety Disorder       Tresa Res, LCSW 07/21/2013

## 2013-07-22 ENCOUNTER — Encounter (HOSPITAL_COMMUNITY)
Admission: RE | Disposition: A | Discharge status: Home / Self Care | Payer: Self-pay | Source: Ambulatory Visit | Attending: General Surgery

## 2013-07-22 ENCOUNTER — Ambulatory Visit (HOSPITAL_COMMUNITY)
Admission: RE | Admit: 2013-07-22 | Discharge: 2013-07-22 | Disposition: A | Discharge status: Home / Self Care | Payer: Commercial Indemnity | Source: Ambulatory Visit | Attending: General Surgery | Admitting: General Surgery

## 2013-07-22 DIAGNOSIS — E282 Polycystic ovarian syndrome: Secondary | ICD-10-CM

## 2013-07-22 DIAGNOSIS — Z8261 Family history of arthritis: Secondary | ICD-10-CM

## 2013-07-22 DIAGNOSIS — E8881 Metabolic syndrome: Secondary | ICD-10-CM

## 2013-07-22 DIAGNOSIS — Z01818 Encounter for other preprocedural examination: Secondary | ICD-10-CM | POA: Diagnosis not present

## 2013-07-22 DIAGNOSIS — E119 Type 2 diabetes mellitus without complications: Secondary | ICD-10-CM

## 2013-07-22 DIAGNOSIS — G4700 Insomnia, unspecified: Secondary | ICD-10-CM

## 2013-07-22 DIAGNOSIS — Z01810 Encounter for preprocedural cardiovascular examination: Secondary | ICD-10-CM | POA: Diagnosis not present

## 2013-07-22 DIAGNOSIS — F411 Generalized anxiety disorder: Secondary | ICD-10-CM

## 2013-07-22 DIAGNOSIS — F509 Eating disorder, unspecified: Secondary | ICD-10-CM

## 2013-07-22 HISTORY — PX: BREATH TEK H PYLORI: SHX5422

## 2013-07-22 SURGERY — BREATH TEST, FOR HELICOBACTER PYLORI

## 2013-07-22 NOTE — Progress Notes (Signed)
07/22/13 [place]  Referring MD Excell Seltzer  Time of Last PO Intake 2200 (on 07/21/13)  Baseline Breath At: 0740  Pranactin Given At: 0742  Post-Dose Breath At: 0757  Sample 1 4.2  Sample 2 3.3  Test Negative

## 2013-07-25 ENCOUNTER — Encounter (HOSPITAL_COMMUNITY): Payer: Self-pay | Admitting: General Surgery

## 2013-07-26 ENCOUNTER — Ambulatory Visit (INDEPENDENT_AMBULATORY_CARE_PROVIDER_SITE_OTHER): Payer: Commercial Indemnity | Admitting: Family Medicine

## 2013-07-26 ENCOUNTER — Encounter: Payer: Self-pay | Admitting: Family Medicine

## 2013-07-26 DIAGNOSIS — F333 Major depressive disorder, recurrent, severe with psychotic symptoms: Secondary | ICD-10-CM | POA: Diagnosis not present

## 2013-07-26 NOTE — Patient Instructions (Signed)
F/u in 4 weeks, call if you need me before  Continue to make exercise fun and do this with your children, everyone benefits then, minimum of 30 minutes total each day (two 15 minute sessions is fine)  Please call an schedule f/u with nutritionist and please measure carbs and proitein. Great that you eat white meat  Eat 3 meals at scheduled times, snacks between ar oK /good BUT fesh /frozen fruit and veg , few nuts (measure)   CONGRATS on 2 pound weight loss

## 2013-07-26 NOTE — Progress Notes (Signed)
   Subjective:    Patient ID: Katelyn [name], female    DOB: July 23, 1975, 38 y.o.   MRN: [id number]  HPI PT IN FOR HER 4 WEEK F/U ON WEIGHT LOSS EFFORT. SHE HAS LOST 2 POUNDS, AND STATES LIFESTYLE HAS BEEN "GOOD AND BAD" EXERCISE HAS BEEN IRREGULAR , AND BASED ON WEATHER SHE WALKS OUTSIDE.Enjoying exercise more now that she has started doing this with her children EATING CHANGES THAT SHE HAS MADE ARE A SWITCH FROM RED TO WHITE MEAT, AND  SHE ONLY DRINKS ZERO CAL DRINKS, WATER, COFFEE OR TEA DECAF WHICH HELP WITH ANXIETY, SHE TAKES DECAF WITH NO CREAM OR SWEETNER. Did not have as significant mood swing with most recent menses which she is just finishing, has made no contact with gyn as yet, but states her psychiatrist wants her to have ECT therapy   Review of Systems See HPI Denies recent fever or chills. Denies sinus pressure, nasal congestion, ear pain or sore throat. Denies chest congestion, productive cough or wheezing. Denies chest pains, palpitations and leg swelling Denies abdominal pain, nausea, vomiting,diarrhea or constipation.   Denies dysuria, frequency, hesitancy or incontinence. Denies joint pain, swelling and limitation in mobility. Denies headaches, seizures, numbness, or tingling. Denies uncontrolled depression, anxiety or insomnia. Denies skin break down or rash.        Objective:   Physical Exam  BP 114/74  Pulse 87  Resp 16  Wt 265 lb 6.4 oz (120.385 kg)  SpO2 97%  LMP 07/22/2013 Patient alert and oriented and in no cardiopulmonary distress.  HEENT: No facial asymmetry, EOMI, no sinus tenderness,  oropharynx pink and moist.  Neck supple no adenopathy.  Chest: Clear to auscultation bilaterally.  CVS: S1, S2 no murmurs, no S3.  ABD: Soft non tender. Bowel sounds normal.  Ext: No edema  MS: Adequate ROM spine, shoulders, hips and knees.  Skin: Intact, no ulcerations or rash noted.  Psych: Good eye contact, normal affect. Memory intact not anxious  or depressed appearing.  CNS: CN 2-12 intact, power, tone and sensation normal throughout.       Assessment & Plan:  Morbid obesity Improved. Pt applauded on succesful weight loss through lifestyle change, and encouraged to continue same. Weight loss goal set for the next several months. New lifestyle goals as well as well as expected weight loss set for next 4 weeks   Depression, major, recurrent, severe with psychosis Currently stable though reports feeling as though coming down from a high. Still resists the eCT therapy recommended by her psych, associates this with memory loss

## 2013-07-31 NOTE — Assessment & Plan Note (Addendum)
Improved. Pt applauded on succesful weight loss through lifestyle change, and encouraged to continue same. Weight loss goal set for the next several months. New lifestyle goals as well as well as expected weight loss set for next 4 weeks

## 2013-07-31 NOTE — Assessment & Plan Note (Signed)
Currently stable though reports feeling as though coming down from a high. Still resists the eCT therapy recommended by her psych, associates this with memory loss

## 2013-08-03 ENCOUNTER — Ambulatory Visit (INDEPENDENT_AMBULATORY_CARE_PROVIDER_SITE_OTHER): Payer: Managed Care, Other (non HMO) | Admitting: Psychiatry

## 2013-08-03 DIAGNOSIS — F411 Generalized anxiety disorder: Secondary | ICD-10-CM

## 2013-08-03 DIAGNOSIS — F333 Major depressive disorder, recurrent, severe with psychotic symptoms: Secondary | ICD-10-CM

## 2013-08-03 NOTE — Progress Notes (Signed)
   THERAPIST PROGRESS NOTE  Session Time: 11:00-11:50 am  Participation Level: Active  Behavioral Response: Neat and Well GroomedAlertEuthymic  Type of Therapy: Individual Therapy  Treatment Goals addressed: Depression and Anxiety  Interventions: DBT and Supportive  Summary: Katelyn [name] is a 38 y.o. female who presents with euthymic mood and affect. Pt has her hair styled, make-up on and is dressed up instead of in sweat pants. She showed writer her mood tracker on [employer] and had good to excellent days since last session with Probation officer. Pt said she feels good and described how she is accepting she has depression and anxiety that must be managed. Pt said she is using her DBT manage it tools and has been maintaining both in the home with her family and outside the home with obligations. Pt said she feel good and is at baseline.   Suicidal/Homicidal: No  Therapist Response: Assessed overall level of functioning per Pt self report, explored stress management tools being used and reinforced Pts acceptance of her illness.   Plan: Return again in 2 weeks. Look at mood tracker  Diagnosis: Axis I: Depression Major Recurrent Severe with psychosis and Generalized Anxiety Disorder     Tresa Res, LCSW 08/03/2013

## 2013-08-09 ENCOUNTER — Encounter: Payer: Commercial Indemnity | Attending: General Surgery | Admitting: Dietician

## 2013-08-09 ENCOUNTER — Encounter: Payer: Self-pay | Admitting: Dietician

## 2013-08-09 DIAGNOSIS — Z713 Dietary counseling and surveillance: Secondary | ICD-10-CM | POA: Insufficient documentation

## 2013-08-09 DIAGNOSIS — Z6841 Body Mass Index (BMI) 40.0 and over, adult: Secondary | ICD-10-CM | POA: Insufficient documentation

## 2013-08-09 DIAGNOSIS — Z01818 Encounter for other preprocedural examination: Secondary | ICD-10-CM | POA: Insufficient documentation

## 2013-08-09 NOTE — Progress Notes (Signed)
Samples provided:  Premier protein shake (chocolate; qty 1) Lot# [id number] Exp 04/2014  Unjury protein powder (unflavored; qty 1) Lot# [id number] Exp 07/2014

## 2013-08-09 NOTE — Patient Instructions (Signed)
[facility] to call the [facility] to enroll in Pre-Op and Post-Op Nutrition Education when surgery date is scheduled. 

## 2013-08-09 NOTE — Progress Notes (Signed)
  Pre-Op Assessment Visit:  Pre-Operative RYGB Surgery  Medical Nutrition Therapy:  Appt start time: 0454   End time:  1600.  Patient was seen on 08/09/2013 for Pre-Operative RYGB Nutrition Assessment. Assessment and letter of approval faxed to [facility] Program coordinator on 08/09/2013.   Preferred Learning Style:  No preference indicated   Learning Readiness:   Ready   Handouts given during visit include:  Pre-Op Goals Bariatric Surgery Protein Shakes  Teaching Method Utilized:  Visual Auditory Hands on  Barriers to learning/adherence to lifestyle change: none  Demonstrated degree of understanding via:  Teach Back   Patient to call the [facility] to enroll in Pre-Op and Post-Op Nutrition Education when surgery date is scheduled.

## 2013-08-15 ENCOUNTER — Encounter (HOSPITAL_COMMUNITY): Payer: Self-pay | Admitting: Psychiatry

## 2013-08-15 ENCOUNTER — Ambulatory Visit (INDEPENDENT_AMBULATORY_CARE_PROVIDER_SITE_OTHER): Payer: Managed Care, Other (non HMO) | Admitting: Psychiatry

## 2013-08-15 VITALS — BP 117/66 | HR 75 | Ht 65.5 in | Wt 263.2 lb

## 2013-08-15 DIAGNOSIS — F329 Major depressive disorder, single episode, unspecified: Secondary | ICD-10-CM

## 2013-08-15 DIAGNOSIS — F29 Unspecified psychosis not due to a substance or known physiological condition: Secondary | ICD-10-CM

## 2013-08-15 DIAGNOSIS — E282 Polycystic ovarian syndrome: Secondary | ICD-10-CM

## 2013-08-15 MED ORDER — FLUOXETINE HCL 40 MG PO CAPS: 40.0000 mg | ORAL_CAPSULE | Freq: Every day | ORAL | Status: DC

## 2013-08-15 MED ORDER — BUPROPION HCL ER (XL) 150 MG PO TB24: 150.0000 mg | ORAL_TABLET | Freq: Every day | ORAL | Status: DC

## 2013-08-15 MED ORDER — TEMAZEPAM 30 MG PO CAPS: 30.0000 mg | ORAL_CAPSULE | Freq: Every day | ORAL | Status: DC

## 2013-08-15 MED ORDER — BENZTROPINE MESYLATE 1 MG PO TABS: 1.0000 mg | ORAL_TABLET | Freq: Every day | ORAL | Status: DC
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
## 2013-08-16 ENCOUNTER — Ambulatory Visit: Payer: Self-pay | Admitting: Family Medicine

## 2013-08-18 ENCOUNTER — Ambulatory Visit (INDEPENDENT_AMBULATORY_CARE_PROVIDER_SITE_OTHER): Payer: Managed Care, Other (non HMO) | Admitting: Psychiatry

## 2013-08-18 DIAGNOSIS — F333 Major depressive disorder, recurrent, severe with psychotic symptoms: Secondary | ICD-10-CM | POA: Diagnosis not present

## 2013-08-18 DIAGNOSIS — F411 Generalized anxiety disorder: Secondary | ICD-10-CM

## 2013-08-18 NOTE — Progress Notes (Signed)
   THERAPIST PROGRESS NOTE  Session Time: 10:00-10:50 am   Participation Level: Active  Behavioral Response: CasualAlertDepressed  Type of Therapy: Individual Therapy  Treatment Goals addressed: Depression  Interventions: CBT, Solution Focused and Supportive  Summary: Katelyn [name] is a 38 y.o. female who presents with moderate depressed mood and affect. Pt is in sweat pants with her hair pulled back and no makeup. Pt said her depression started increasing three days ago and started with feelings of sadness for two days and moderate depression yesterday. Pt showed writer her mood tracker on her phone and it showed three weeks of really good mood, one week of stable mood and with last week as an increase in depression. Pt struggled to identify what activities she pulled from on her depressed days to manage her mood. Pt said she would find her sometimes list at home that she pulls from and identify what skills to use on triangle and square days (mild and depressed mood days). Pt said her kids are stressors for her now in that they are arguing in the car on the way home from school. Pt identified that she could ask them how to make the car ride less stressful and implement some changes there. Pt also showed Probation officer a video she took last night her [name] (age 15) trying to go homework that lasted for two hours where she appeared distracted and unfocused. Pt expressed concerns about learning difficulties or ADHD and said she wants to get her tested. Pt said she does not feel she needs an assigned therapist while Probation officer is on maternity leave if she continues working her current plan and agreed to contact the receptionist if she needs support or a therapy referral while Probation officer is away. Pt denied thoughts of self harm or suicide.   Suicidal/Homicidal: No  Therapist Response: Assessed overall level of depression, reviewed mood tracker and identified triggers to depressed mood, explored coping skills to  mange depression on down days, referred pt to a child psychiatrist and made maternity plan.   Plan: Return again in 2 weeks.  Diagnosis: Axis I: Depression Major Recurrent Severe with Psychosis and Generalized Anxiety Disorder      Tresa Res, LCSW 08/18/2013

## 2013-08-23 ENCOUNTER — Encounter: Payer: Self-pay | Admitting: Family Medicine

## 2013-08-23 ENCOUNTER — Ambulatory Visit (INDEPENDENT_AMBULATORY_CARE_PROVIDER_SITE_OTHER): Payer: Commercial Indemnity | Admitting: Family Medicine

## 2013-08-23 VITALS — BP 114/74 | HR 93 | Resp 16 | Ht 65.5 in | Wt 264.0 lb

## 2013-08-23 DIAGNOSIS — E669 Obesity, unspecified: Secondary | ICD-10-CM | POA: Diagnosis not present

## 2013-08-23 DIAGNOSIS — G479 Sleep disorder, unspecified: Secondary | ICD-10-CM | POA: Diagnosis not present

## 2013-08-23 DIAGNOSIS — F333 Major depressive disorder, recurrent, severe with psychotic symptoms: Secondary | ICD-10-CM | POA: Diagnosis not present

## 2013-08-23 DIAGNOSIS — F411 Generalized anxiety disorder: Secondary | ICD-10-CM

## 2013-08-23 NOTE — Patient Instructions (Signed)
F/u in 4 weeks, call if you need me before  Labs are excellent, just reduce fatty food a bit  Changes as discussed, keep working at these  All the best, and take good care of Mom

## 2013-08-28 NOTE — Progress Notes (Signed)
   Subjective:    Patient ID: Katelyn [name], female    DOB: 24-May-1975, 38 y.o.   MRN: [id number]  HPI Pt in today for f/u weight loss program which will hopefully qualify her for bariatric surgery. She has lost 1 pound since her ;last visit , and overall 14 pounds in the past 5 months. Physically she has been well, emotionally challenged currently with being premenstrual , but has new coping strategies to deal with her mood instability esp at this time. Her current therapist is unfortunately going to be out for apprpox 3 months, she has another one which I advise her to make contact with , she needs the ongoing support Has recently met with nutritionist and has set goals as far as her diet documented , she also states her exercise remains inconsistent , which is clearly a problem   Review of Systems See HPI Denies recent fever or chills. Denies sinus pressure, nasal congestion, ear pain or sore throat. Denies chest congestion, productive cough or wheezing. Denies chest pains, palpitations and leg swelling Denies abdominal pain, nausea, vomiting,diarrhea or constipation.   Denies dysuria, frequency, hesitancy or incontinence. Denies joint pain, swelling and limitation in mobility. Denies headaches, seizures, numbness, or tingling. Denies uncontrolled depression, anxiety or insomnia. Denies skin break down or rash.        Objective:   Physical Exam BP 114/74  Pulse 93  Resp 16  Ht 5' 5.5" (1.664 m)  Wt 264 lb (119.75 kg)  BMI 43.25 kg/m2  SpO2 97% Patient alert and oriented and in no cardiopulmonary distress.  HEENT: No facial asymmetry, EOMI, no sinus tenderness,  oropharynx pink and moist.  Neck supple no adenopathy.  Chest: Clear to auscultation bilaterally.  CVS: S1, S2 no murmurs, no S3.  ABD: Soft non tender. Bowel sounds normal.  Ext: No edema  MS: Adequate ROM spine, shoulders, hips and knees.  Skin: Intact, no ulcerations or rash noted.  Psych: Good eye  contact, normal affect. Memory intact not anxious or depressed appearing.  CNS: CN 2-12 intact, power, tone and sensation normal throughout.        Assessment & Plan:  OBESITY Continues to  Work on behavioral modification to facilitate weight loss, not much weight loss in past 1 month,  But the ongoing mental health challenge as well as the fact that her exercise is inconsistent still, has resulted in only 1 pound weight loss this past 4 weeks. For pt, mental health needs have to take precedence in her care. Goals re regular eating with portion size of protein per 24 hrs as well as water intake recommended are specifically noted, and her exercise is to increase to 45 mins daily as well as she is to formulate a list of non food related pleasurable activities . Bariatric surgery will definitely be beneficial to patient in my opinion  Depression, major, recurrent, severe with psychosis Current ly stable, needs very close regular follow up with therapist as well as psychiatry  Sleep disorder Controlled, no change in medication   Generalized anxiety disorder Improving, more able to get out in the public without extreme anxiety symptoms developing

## 2013-08-28 NOTE — Assessment & Plan Note (Signed)
Controlled, no change in medication  

## 2013-08-28 NOTE — Assessment & Plan Note (Signed)
Continues to  Work on behavioral modification to facilitate weight loss, not much weight loss in past 1 month,  But the ongoing mental health challenge as well as the fact that her exercise is inconsistent still, has resulted in only 1 pound weight loss this past 4 weeks. For pt, mental health needs have to take precedence in her care. Goals re regular eating with portion size of protein per 24 hrs as well as water intake recommended are specifically noted, and her exercise is to increase to 45 mins daily as well as she is to formulate a list of non food related pleasurable activities . Bariatric surgery will definitely be beneficial to patient in my opinion

## 2013-08-28 NOTE — Assessment & Plan Note (Signed)
Improving, more able to get out in the public without extreme anxiety symptoms developing

## 2013-08-28 NOTE — Assessment & Plan Note (Signed)
Current ly stable, needs very close regular follow up with therapist as well as psychiatry

## 2013-09-01 ENCOUNTER — Telehealth (HOSPITAL_COMMUNITY): Payer: Self-pay | Admitting: *Deleted

## 2013-09-01 ENCOUNTER — Telehealth (HOSPITAL_COMMUNITY): Payer: Self-pay | Admitting: Psychiatry

## 2013-09-01 ENCOUNTER — Ambulatory Visit (INDEPENDENT_AMBULATORY_CARE_PROVIDER_SITE_OTHER): Payer: Managed Care, Other (non HMO) | Admitting: Psychiatry

## 2013-09-01 DIAGNOSIS — F333 Major depressive disorder, recurrent, severe with psychotic symptoms: Secondary | ICD-10-CM

## 2013-09-01 DIAGNOSIS — F411 Generalized anxiety disorder: Secondary | ICD-10-CM | POA: Diagnosis not present

## 2013-09-01 NOTE — Progress Notes (Signed)
   THERAPIST PROGRESS NOTE  Session Time: 10:00-10:50 am  Participation Level: Active  Behavioral Response: Casual, Neat and Well GroomedAlertAnxious  Type of Therapy: Individual Therapy  Treatment Goals addressed: Depression and Anxiety  Interventions: CBT, Solution Focused and Supportive  Summary: Katelyn [name] is a 38 y.o. female who presents with moderate anxiety. Pt described an increase in anxiety over the past three days associated with the following stressors; 1) Mothers knee surgery. Pt is worried about her mother not being able to help with the children this summer due to knee pain and the probability of knee surgery that will prevent the kids from going to the beach this summer with her parents. Pt expressed concerns that she will be the primary caretaker of both children all summer. Pt brainstormed summer camps and activities that will help provide some needed structure. 2) Daughter diagnosed with ADHD and started taking Adderal this week. Pt and her daughter see significant improvement in behaviors from starting the medications, but this was a stressor for Pt to get her started on medications. 3) Blurry eyesight. Pt questions if her Cogentin is the cause. Pt was going to just "stop taking it" and agreed to contact Dr. Adele Schilder instead of changing her medications on her own. Pt showed writer her mood tracker for he past two weeks and most days have been fair to ok with the exception of the last three. Pt identified skills she could use today to help get her mood back into a calm area.   Suicidal/Homicidal: No   Therapist Response: Assessed overall level of anxiety and depression through Mood tracker, processed stressors and created a summer plan for her children to make anxiety more management. Used solution focused therapy to identify ways to reduce other stressors, encouraged Pt to contact Dr. Adele Schilder regarding medication questions.   Plan: Return again in 2  weeks.  Diagnosis: Axis I: Depression Major Recurrent Severe with Psychosis and Generalized Anxiety Disorder      Tresa Res, LCSW 09/01/2013

## 2013-09-01 NOTE — Telephone Encounter (Signed)
I returned patient's phone call today she is complaining of blurry vision and dry mouth from Cogentin.  I recommended to reduce the dose to half and call us back if the symptoms does not get better.

## 2013-09-01 NOTE — Telephone Encounter (Signed)
Patient left ZP:HXTAV Cogentin was increased, has had blurry vision, even with glasses on.Is that a side effect? Should she continue at higher dose, or redue to previous dose?

## 2013-09-13 ENCOUNTER — Ambulatory Visit: Payer: Self-pay | Admitting: Family Medicine

## 2013-09-15 ENCOUNTER — Ambulatory Visit (INDEPENDENT_AMBULATORY_CARE_PROVIDER_SITE_OTHER): Payer: Managed Care, Other (non HMO) | Admitting: Psychiatry

## 2013-09-15 DIAGNOSIS — F411 Generalized anxiety disorder: Secondary | ICD-10-CM

## 2013-09-15 DIAGNOSIS — F333 Major depressive disorder, recurrent, severe with psychotic symptoms: Secondary | ICD-10-CM

## 2013-09-15 NOTE — Progress Notes (Signed)
   THERAPIST PROGRESS NOTE  Session Time: 10:00-10:50 am   Participation Level: Active  Behavioral Response: CasualAlertAnxious and Depressed  Type of Therapy: Individual Therapy  Treatment Goals addressed: Anxiety and Depression  Interventions: CBT, Solution Focused and Supportive  Summary: MARICSA SAMMONS is a 38 y.o. female who presents with mild anxious mood and affect. Pt reports an increase in anxiety and depressive symptoms due to life stressors that have occurred in the past two weeks. Pt said she has taken on the role of acting as a medical advocate for her father-in-law who has been diagnosed with cancer. Pt has been taking him to appointments and communicating with his doctors. Pt has also been helping her mom recovery from her recent knee surgery and has advocated for herself with her daughters soccer coach regarding not feeling like her daughter has been getting equal playing time. All these items have increased Pts stress level some. Pt continues to function and explored coping skills to manage her increase in symptoms. Pt said she is being more flexible with her rigid thinking pertaining to her children and gave an example of how she adjusted her daughters bedtime to be more accomodating to he needs and described how that is working well. Pt organized her weekend with friends a family to create more structure where she was feeling overwhelmed with scheduling. Pts birthday is tomorrow and she said she feels less stressed seeing a scheduled time plan.   Suicidal/Homicidal: No   Therapist Response: Assessed overall level of functioning per PT self report, reviewed coping skills and plan. Used cbt to explore behaviors to reduce anxiety.   Plan: Return again after writer returns from maternity leave. Jarrett Soho with [facility] will provide counseling while writer is on maternity leave.   Diagnosis: Axis I: Depression Major Recurrent Severe with Psychosis and Generalized Anxiety  Disorder      Tresa Res, LCSW 09/15/2013

## 2013-09-16 ENCOUNTER — Encounter (HOSPITAL_COMMUNITY): Payer: Self-pay | Admitting: Psychiatry

## 2013-09-16 ENCOUNTER — Ambulatory Visit (INDEPENDENT_AMBULATORY_CARE_PROVIDER_SITE_OTHER): Payer: Managed Care, Other (non HMO) | Admitting: Psychiatry

## 2013-09-16 ENCOUNTER — Encounter (HOSPITAL_COMMUNITY): Payer: Self-pay | Admitting: *Deleted

## 2013-09-16 VITALS — Wt 265.0 lb

## 2013-09-16 DIAGNOSIS — F323 Major depressive disorder, single episode, severe with psychotic features: Secondary | ICD-10-CM

## 2013-09-16 DIAGNOSIS — F329 Major depressive disorder, single episode, unspecified: Secondary | ICD-10-CM

## 2013-09-16 MED ORDER — ZIPRASIDONE HCL 80 MG PO CAPS: 80.0000 mg | ORAL_CAPSULE | Freq: Two times a day (BID) | ORAL | Status: DC

## 2013-09-16 MED ORDER — TEMAZEPAM 30 MG PO CAPS: 30.0000 mg | ORAL_CAPSULE | Freq: Every day | ORAL | Status: DC

## 2013-09-16 MED ORDER — TRIHEXYPHENIDYL HCL 2 MG PO TABS: 2.0000 mg | ORAL_TABLET | Freq: Every day | ORAL | Status: DC
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
## 2013-09-16 NOTE — Progress Notes (Signed)
Temazepam 30 mg authorized for quantity of 30/30 days by [facility] Case ID# [id number] Authorized 08/17/13 thru 09/16/14 Patient notified by phone of authorization/dates (left message at home #)

## 2013-09-20 ENCOUNTER — Encounter: Payer: Self-pay | Admitting: Family Medicine

## 2013-09-20 ENCOUNTER — Ambulatory Visit (INDEPENDENT_AMBULATORY_CARE_PROVIDER_SITE_OTHER): Payer: Commercial Indemnity | Admitting: Family Medicine

## 2013-09-20 VITALS — BP 120/80 | HR 97 | Resp 16 | Wt 267.1 lb

## 2013-09-20 DIAGNOSIS — N3090 Cystitis, unspecified without hematuria: Secondary | ICD-10-CM

## 2013-09-20 DIAGNOSIS — F411 Generalized anxiety disorder: Secondary | ICD-10-CM | POA: Diagnosis not present

## 2013-09-20 DIAGNOSIS — G479 Sleep disorder, unspecified: Secondary | ICD-10-CM | POA: Diagnosis not present

## 2013-09-20 LAB — POCT URINALYSIS DIPSTICK
BILIRUBIN UA: NEGATIVE
Blood, UA: NEGATIVE
GLUCOSE UA: NEGATIVE
KETONES UA: NEGATIVE
LEUKOCYTES UA: NEGATIVE
NITRITE UA: NEGATIVE
Protein, UA: NEGATIVE
Spec Grav, UA: 1.015
Urobilinogen, UA: 0.2
pH, UA: 7

## 2013-09-20 NOTE — Patient Instructions (Addendum)
F/u as before  Urine is normal.  You need to reduce the responsibilities that you currently have so that you don not become even more overwhelmed, also get the help that you need from family memebers, let them know what is going on Put a schedule on your fridge of medication names , doses and times for you and your daughter Katelyn [name] to help you to remember  I will send [name] a flag  Pls commit to daily exercise as we discussed and change of eating

## 2013-09-20 NOTE — Progress Notes (Signed)
Subjective:    Patient ID: Katelyn [name], female    DOB: 1975/09/04, 38 y.o.   MRN: [id number]  HPI  1 week h/o frequency , with fever , chills and left flank pain Increased anxiety this morning, has been forgetting her meds recently and is not sleeping well Currently overwhelmed due to multiple illnesses in her family, Mom recently had surgery, daughter now on medication for ADHD, and father in law with newly diagnosed severe anemia which  Requires a lot of medical evaluations and she has been put in charge of this  Weight gain since last visit, a lot of holidays, mental health and mood is uncontrolled currently and this makes her weight loss attempt impossible to fulfill  Starting a 30 minute daily exercise  Program with neighbor at 5:30 am 6 days  Per week      Review of Systems See HPI Denies recent fever or chills. Denies sinus pressure, nasal congestion, ear pain or sore throat. Denies chest congestion, productive cough or wheezing. Denies chest pains, palpitations and leg swelling Denies abdominal pain, nausea, vomiting,diarrhea or constipation.    Denies joint pain, swelling and limitation in mobility. Denies headaches, seizures, numbness, or tingling. Denies suicidal or homicidal ideation, npo hallucinations Denies skin break down or rash.        Objective:   Physical Exam BP 120/80  Pulse 97  Resp 16  Wt 267 lb 1.9 oz (121.165 kg)  SpO2 97% Patient alert and oriented and in no cardiopulmonary distress.Extremely anxious and at times slightly tearful  HEENT: No facial asymmetry, EOMI, no sinus tenderness,  oropharynx pink and moist.  Neck supple no adenopathy.  Chest: Clear to auscultation bilaterally.  CVS: S1, S2 no murmurs, no S3.  ABD: Soft left flank tender, no suprapubic tenderness Ext: No edema  MS: Adequate ROM spine, shoulders, hips and knees.  Skin: Intact, no ulcerations or rash noted.  Psych: Good eye contact, anxious. Memory intact   anxious and mildly  depressed appearing.  CNS: CN 2-12 intact, power, tone and sensation normal throughout.        Assessment & Plan:  Generalized anxiety disorder Increased due to multiple personal stressors, many family members ill, and her therapist is unavailable. Plans to make family aware that she is unale to cope with the increased stress, so changes can be made on their expectations of her , also is comfortable with making her psych aware of her current state, though she saw  Him less than 1 week ago, she "is not as open"about her mental state (reported in the past that he recommends ECT and she does not want this again) She is not suicidal or homicidal  Morbid obesity Deteriorated.Discussed specific behavioral changes which she will be better able to implement when her mental health is better Patient re-educated about  the importance of commitment to a  minimum of 150 minutes of exercise per week. The importance of healthy food choices with portion control discussed. Encouraged to start a food diary, count calories and to consider  joining a support group. Sample diet sheets offered. Goals set by the patient for the next several months.     Sleep disorder Deteriorated, reports no sleep the night behavior, states she has been "forgetting her meds", has friend with her today , who she states is a part of her support system,  She will work at taking meds as prescribed  Cystitis Normal UA in setting of multiple symptoms of severe UTI

## 2013-09-25 DIAGNOSIS — N3090 Cystitis, unspecified without hematuria: Secondary | ICD-10-CM | POA: Insufficient documentation

## 2013-09-25 NOTE — Assessment & Plan Note (Signed)
Deteriorated, reports no sleep the night behavior, states she has been "forgetting her meds", has friend with her today , who she states is a part of her support system,  She will work at taking meds as prescribed

## 2013-09-25 NOTE — Assessment & Plan Note (Signed)
Increased due to multiple personal stressors, many family members ill, and her therapist is unavailable. Plans to make family aware that she is unale to cope with the increased stress, so changes can be made on their expectations of her , also is comfortable with making her psych aware of her current state, though she saw  Him less than 1 week ago, she "is not as open"about her mental state (reported in the past that he recommends ECT and she does not want this again) She is not suicidal or homicidal

## 2013-09-25 NOTE — Assessment & Plan Note (Addendum)
Deteriorated.Discussed specific behavioral changes which she will be better able to implement when her mental health is better Patient re-educated about  the importance of commitment to a  minimum of 150 minutes of exercise per week. The importance of healthy food choices with portion control discussed. Encouraged to start a food diary, count calories and to consider  joining a support group. Sample diet sheets offered. Goals set by the patient for the next several months.

## 2013-09-25 NOTE — Assessment & Plan Note (Signed)
Normal UA in setting of multiple symptoms of severe UTI

## 2013-09-29 ENCOUNTER — Telehealth: Payer: Self-pay | Admitting: Family Medicine

## 2013-09-29 NOTE — Telephone Encounter (Signed)
Noted , great! 

## 2013-09-30 ENCOUNTER — Ambulatory Visit (INDEPENDENT_AMBULATORY_CARE_PROVIDER_SITE_OTHER): Payer: Commercial Indemnity | Admitting: Neurology

## 2013-09-30 ENCOUNTER — Encounter: Payer: Self-pay | Admitting: Neurology

## 2013-09-30 VITALS — BP 100/70 | HR 89 | Ht 66.54 in | Wt 265.0 lb

## 2013-09-30 DIAGNOSIS — R51 Headache: Secondary | ICD-10-CM | POA: Diagnosis not present

## 2013-09-30 DIAGNOSIS — R519 Headache, unspecified: Secondary | ICD-10-CM

## 2013-09-30 DIAGNOSIS — G43009 Migraine without aura, not intractable, without status migrainosus: Secondary | ICD-10-CM

## 2013-09-30 NOTE — Patient Instructions (Addendum)
1. Continue topamax 50mg  twice daily 2. Start taking magnesium oxide 400-600mg  daily 3. Return to clinic in 57-months, or sooner as needed

## 2013-09-30 NOTE — Progress Notes (Signed)
Follow-up Visit   Date: 09/30/2013    PARVEEN FREEHLING MRN: [id number] DOB: Oct 27, 1975   Interim History: Katelyn [name] is a 38 y.o. right-handed Caucasian female with history of history of diabetes, polycystic ovarian cystic syndrome, depression with psychosis, and mild OSA (not on CPAP) returning to the clinic for follow-up of chronic daily headaches.  The patient was accompanied to the clinic by self.  She was last seen in the clinic on 06/30/2013.  History of present illness: Headaches started around 2012 after her ECT therapy (total of 12 sessions). She has no prior history of migraines or severe headaches prior to this. Headaches are described as "tightness, pressure" over the forehead. She has occasional radiation of pain to the back of her head. Headache is daily (ranked as 3/10), with varying intensity throughout the day. It is worsened by loud noises and stress. When severe, she has photophobia, nausea, and vomiting. Duration is 5-6 hours. Severe headaches occur once every 1-2 months. She is able to function with daily headaches, but with severe pain, she has to lay down. She has tried hot and cold compresses, ibuprofen, excedrin migraine, tylenol, and Aleve which does not help. She does not take anything for them anymore because nothing works. She was recently started on fioricet which helped when she took two.  She reports snoring loudly and feeling tired in the morning. She does not get restful sleep. Restoril was started for insomnia which helped her get 6 hours of uninterrupted sleep, but this is started to wear off.   Her psychiatrist also tried topamax 50mg  but there was no change in symptoms. Of all the things she has tried, she has felt the most relief with hot/cold compresses.  Of note, she takes ziprasidone 40 MG for depression with psychosis for a number of years and has generalized tremors.   - Follow-up 06/30/2013:  She reports having significant improvement after  increasing topamax to 100mg /day, especially with migraines.   She continues to have low-grade daily headache, but this also improved because it is tolerable now.  She reports having psychiatric hospitalization January 2015 for suicidal ideation and impulsive thoughts.     - Follow 09/30/2013:  She reports having migraines about once per month and takes naproxen.  She is having headache-free days, occuring about once per week. Otherwise, she still has low-grade dull headache.  Denies any side effects to topamax or plans for pregnancy.  She is approved for gastric bypass which potentially scheduled for early July.  She is very happy that headaches are better because she is less sedentary and is able to engage with her children.     Medications:  Current Outpatient Prescriptions on File Prior to Visit  Medication Sig Dispense Refill  . buPROPion (WELLBUTRIN XL) 150 MG 24 hr tablet Take 1 tablet (150 mg total) by mouth daily.  90 tablet  0  . FLUoxetine (PROZAC) 40 MG capsule Take 1 capsule (40 mg total) by mouth daily.  90 capsule  0  . metFORMIN (GLUCOPHAGE) 500 MG tablet Take 1 tablet (500 mg total) by mouth 2 (two) times daily with a meal.  60 tablet  11  . Multiple Vitamin (MULTIVITAMIN WITH MINERALS) TABS tablet Take 1 tablet by mouth every morning.      . phentermine 37.5 MG capsule Take 37.5 mg by mouth daily before lunch.      . temazepam (RESTORIL) 30 MG capsule Take 1 capsule (30 mg total) by mouth at bedtime.  30 capsule  0  . topiramate (TOPAMAX) 50 MG tablet Take 1 tablet (50 mg total) by mouth 2 (two) times daily.  180 tablet  3  . trihexyphenidyl (ARTANE) 2 MG tablet Take 1 tablet (2 mg total) by mouth at bedtime.  30 tablet  0  . ziprasidone (GEODON) 80 MG capsule Take 1 capsule (80 mg total) by mouth 2 (two) times daily with a meal.  180 capsule  0   No current facility-administered medications on file prior to visit.    Allergies:  Allergies  Allergen Reactions  . Lithium      Catatonic state  . Penicillins     REACTION: Rash     Review of Systems:  CONSTITUTIONAL: No fevers, chills, night sweats, weight loss.   EYES: No visual changes or eye pain ENT: No hearing changes.  No history of nose bleeds.   RESPIRATORY: No cough, wheezing and shortness of breath.   CARDIOVASCULAR: Negative for chest pain, and palpitations.   GI: Negative for abdominal discomfort, blood in stools or black stools.  No recent change in bowel habits.   GU:  No history of incontinence.   MUSCLOSKELETAL: No history of joint pain or swelling.  No myalgias.   SKIN: Negative for lesions, rash, and itching.   ENDOCRINE: Negative for cold or heat intolerance, polydipsia or goiter.   PSYCH:  + depression or anxiety symptoms.   NEURO: As Above.   Vital Signs:  BP 100/70  Pulse 89  Ht 5' 6.53" (1.69 m)  Wt 265 lb 0.6 oz (120.22 kg)  BMI 42.09 kg/m2  SpO2 92%  Neurological Exam: MENTAL STATUS including orientation to time, place, person, recent and remote memory, attention span and concentration, language, and fund of knowledge is normal.  Speech is not dysarthric.  CRANIAL NERVES: Pupils equal round and reactive to light.  Normal conjugate, extra-ocular eye movements in all directions of gaze.  No ptosis. Face is symmetric. Palate elevates symmetrically.  Tongue is midline and tremulous.  Occasional involuntary movements of the face is seen.  MOTOR:  Motor strength is 5/5 in all extremities. Intermittent mild bilateral hand intention tremors.  Tone is normal.  MSRs:  Reflexes are 2+/4 throughout.  COORDINATION/GAIT: Gait narrow based and stable.   Data: Lab Results  Component Value Date   [id number] 04/11/2013   Lab Results  Component Value Date   TSH 1.485 07/01/2013   MRI brain 12/2010: Normal examination. No pituitary macroadenoma. No diagnosable microadenoma.    IMPRESSION/PLAN: 1.  Chronic daily headaches  - Clinically, doing great. Now with headache-free days  -  Continue current dose of topiramate 50mg  BID, discussed once daily dosing but patient happy with her regimen.  No side effects  - Start taking magnesium oxide 400-600mg  daily 2.  Migraine without aura  - Occuring once per month  - Discussed starting triptan, but patient declined 3.  Depression with psychosis, followed by Dr. Adele Schilder 4.  Return to clinic in 53-months    The duration of this appointment visit was 20 minutes of face-to-face time with the patient.  Greater than 50% of this time was spent in counseling, explanation of diagnosis, planning of further management, and coordination of care.   Thank you for allowing me to participate in patient's care.  If I can answer any additional questions, I would be pleased to do so.    Sincerely,    Yaretzy Olazabal K. Posey Pronto, DO

## 2013-10-10 NOTE — Progress Notes (Signed)
Dr. Excell Seltzer  -  Katelyn [name] has been scheduled for her surgery on 7/6  - Please enter her preop orders in Epic.  Thanks.

## 2013-10-11 ENCOUNTER — Ambulatory Visit: Payer: Self-pay | Admitting: Family Medicine

## 2013-10-11 ENCOUNTER — Encounter (HOSPITAL_COMMUNITY): Payer: Self-pay | Admitting: Psychiatry

## 2013-10-11 ENCOUNTER — Ambulatory Visit (INDEPENDENT_AMBULATORY_CARE_PROVIDER_SITE_OTHER): Payer: Managed Care, Other (non HMO) | Admitting: Psychiatry

## 2013-10-11 VITALS — BP 113/72 | HR 82 | Ht 65.5 in | Wt 266.0 lb

## 2013-10-11 DIAGNOSIS — F323 Major depressive disorder, single episode, severe with psychotic features: Secondary | ICD-10-CM

## 2013-10-11 DIAGNOSIS — F329 Major depressive disorder, single episode, unspecified: Secondary | ICD-10-CM

## 2013-10-11 MED ORDER — FLUOXETINE HCL 40 MG PO CAPS: 40.0000 mg | ORAL_CAPSULE | Freq: Every day | ORAL | Status: DC

## 2013-10-11 MED ORDER — BUPROPION HCL ER (XL) 150 MG PO TB24
150.0000 mg | ORAL_TABLET | Freq: Every day | ORAL | Status: DC
Start: 2013-10-11 — End: 2013-10-13

## 2013-10-11 MED ORDER — TEMAZEPAM 30 MG PO CAPS: 30.0000 mg | ORAL_CAPSULE | Freq: Every day | ORAL | Status: DC
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
## 2013-10-13 ENCOUNTER — Encounter (HOSPITAL_COMMUNITY): Payer: Self-pay | Admitting: Pharmacy Technician

## 2013-10-13 ENCOUNTER — Ambulatory Visit: Payer: Managed Care, Other (non HMO) | Admitting: Family Medicine

## 2013-10-17 ENCOUNTER — Encounter: Payer: Managed Care, Other (non HMO) | Attending: General Surgery

## 2013-10-17 DIAGNOSIS — Z713 Dietary counseling and surveillance: Secondary | ICD-10-CM | POA: Insufficient documentation

## 2013-10-17 DIAGNOSIS — Z01818 Encounter for other preprocedural examination: Secondary | ICD-10-CM | POA: Insufficient documentation

## 2013-10-17 DIAGNOSIS — Z6841 Body Mass Index (BMI) 40.0 and over, adult: Secondary | ICD-10-CM | POA: Insufficient documentation

## 2013-10-18 NOTE — Progress Notes (Signed)
  Pre-Operative Nutrition Class:  Appt start time: 8242   End time:  1830.  Patient was seen on 10/17/2013 for Pre-Operative Bariatric Surgery Education [facility].   Surgery date: 10/31/2013 Surgery type: RYGB Start weight at [facility]: 265.5 lbs on 08/09/2013 Weight today: 264.5 lbs  TANITA  BODY COMP RESULTS  10/17/2013   BMI (kg/m^2) 44   Fat Mass (lbs) 140   Fat Free Mass (lbs) 124.5   Total Body Water (lbs) 91   Samples given per MNT protocol. Patient educated on appropriate usage:  Unjury Protein powder (vanilla - qty 1) Lot#: [id number] Exp: 01/2015  Bariatric Advantage Calcium citrate (cinnamon - qty 1) Lot#: [id number] Exp: 11/2013  Bariactiv Multivitamin (qty 1) Lot#: [id number] Exp: 08/2014  Premier protein shake (vanilla - qty 1) Lot #: [id number] Exp: 01/2014   The following the learning objectives were met by the patient during this course:  Identify Pre-Op Dietary Goals and will begin 2 weeks pre-operatively  Identify appropriate sources of fluids and proteins   State protein recommendations and appropriate sources pre and post-operatively  Identify Post-Operative Dietary Goals and will follow for 2 weeks post-operatively  Identify appropriate multivitamin and calcium sources  Describe the need for physical activity post-operatively and will follow MD recommendations  State when to call healthcare provider regarding medication questions or post-operative complications  Handouts given during class include:  Pre-Op Bariatric Surgery Diet Handout  Protein Shake Handout  Post-Op Bariatric Surgery Nutrition Handout  BELT Program Information Flyer  Support Group Information Flyer  [facility] Bariatric Supplements Price List  Follow-Up Plan: Patient will follow-up at [facility] 2 weeks post operatively for diet advancement per MD.

## 2013-10-18 NOTE — Progress Notes (Signed)
DR. Excell Seltzer - Katelyn [name] IS COMING TO [place] - WED 6/24 AT 11:00 AM FOR HER PREOP - PLEASE ENTER HER PREOP ORDERS IN EPIC.  THANKS.

## 2013-10-19 ENCOUNTER — Encounter (HOSPITAL_COMMUNITY): Payer: Self-pay

## 2013-10-19 ENCOUNTER — Other Ambulatory Visit (INDEPENDENT_AMBULATORY_CARE_PROVIDER_SITE_OTHER): Payer: Self-pay | Admitting: General Surgery

## 2013-10-19 ENCOUNTER — Encounter (HOSPITAL_COMMUNITY)
Admission: RE | Admit: 2013-10-19 | Discharge: 2013-10-19 | Disposition: A | Discharge status: Home / Self Care | Payer: Managed Care, Other (non HMO) | Source: Ambulatory Visit | Attending: General Surgery | Admitting: General Surgery

## 2013-10-19 DIAGNOSIS — Z01812 Encounter for preprocedural laboratory examination: Secondary | ICD-10-CM | POA: Insufficient documentation

## 2013-10-19 HISTORY — DX: Pneumonia, unspecified organism: J18.9

## 2013-10-19 HISTORY — DX: Other complications of anesthesia, initial encounter: T88.59XA

## 2013-10-19 HISTORY — DX: Cardiac murmur, unspecified: R01.1

## 2013-10-19 HISTORY — DX: Heartburn: R12

## 2013-10-19 HISTORY — DX: Constipation, unspecified: K59.00

## 2013-10-19 HISTORY — DX: Adverse effect of unspecified anesthetic, initial encounter: T41.45XA

## 2013-10-19 LAB — COMPREHENSIVE METABOLIC PANEL
ALBUMIN: 4 g/dL (ref 3.5–5.2)
ALK PHOS: 100 U/L (ref 39–117)
ALT: 14 U/L (ref 0–35)
AST: 16 U/L (ref 0–37)
BUN: 20 mg/dL (ref 6–23)
CO2: 23 meq/L (ref 19–32)
CREATININE: 0.88 mg/dL (ref 0.50–1.10)
Calcium: 9.1 mg/dL (ref 8.4–10.5)
Chloride: 106 mEq/L (ref 96–112)
GFR calc Af Amer: >90 mL/min (ref >90)
GFR, EST NON AFRICAN AMERICAN: 82 mL/min — AB (ref >90)
Glucose, Bld: 89 mg/dL (ref 70–99)
Potassium: 4.4 mEq/L (ref 3.7–5.3)
Sodium: 144 mEq/L (ref 137–147)
Total Protein: 7.6 g/dL (ref 6.0–8.3)

## 2013-10-19 LAB — CBC WITH DIFFERENTIAL/PLATELET
BASOS PCT: 0 % (ref 0–1)
Basophils Absolute: 0 10*3/uL (ref 0.0–0.1)
Eosinophils Absolute: 0.1 10*3/uL (ref 0.0–0.7)
Eosinophils Relative: 1 % (ref 0–5)
HEMATOCRIT: 40.8 % (ref 36.0–46.0)
HEMOGLOBIN: 13.3 g/dL (ref 12.0–15.0)
LYMPHS PCT: 17 % (ref 12–46)
Lymphs Abs: 1.9 10*3/uL (ref 0.7–4.0)
MCH: 30.1 pg (ref 26.0–34.0)
MCHC: 32.6 g/dL (ref 30.0–36.0)
MCV: 92.3 fL (ref 78.0–100.0)
MONO ABS: 0.5 10*3/uL (ref 0.1–1.0)
MONOS PCT: 5 % (ref 3–12)
NEUTROS PCT: 77 % (ref 43–77)
Neutro Abs: 8.8 10*3/uL — ABNORMAL HIGH (ref 1.7–7.7)
Platelets: 343 10*3/uL (ref 150–400)
RBC: 4.42 MIL/uL (ref 3.87–5.11)
RDW: 13.8 % (ref 11.5–15.5)
WBC: 11.3 10*3/uL — AB (ref 4.0–10.5)

## 2013-10-19 NOTE — Patient Instructions (Signed)
ASK DR. HOXWORTH IF ANY BOWEL PREP NEEDED - ANY INSTRUCTIONS WOULD COME FROM HIS OFFICE.     YOUR SURGERY IS SCHEDULED AT [facility]  ON:  Monday  7/6  REPORT TO  SHORT STAY CENTER AT:  5:15 am   PLEASE COME IN THE [place] ENTRANCE AND FOLLOW SIGNS TO SHORT STAY CENTER.  DO NOT EAT OR DRINK ANYTHING AFTER MIDNIGHT THE NIGHT BEFORE YOUR SURGERY.  YOU MAY BRUSH YOUR TEETH, RINSE OUT YOUR MOUTH--BUT NO WATER, NO FOOD, NO CHEWING GUM, NO MINTS, NO CANDIES, NO CHEWING TOBACCO.  PLEASE TAKE THE FOLLOWING MEDICATIONS THE AM OF YOUR SURGERY WITH A FEW SIPS OF WATER:  WELLBUTRIN, PROZAC, TOPAMAX, GEODON   DO NOT BRING VALUABLES, MONEY, CREDIT CARDS.  DO NOT WEAR JEWELRY, MAKE-UP, NAIL POLISH AND NO METAL PINS OR CLIPS IN YOUR HAIR. CONTACT LENS, DENTURES / PARTIALS, GLASSES SHOULD NOT BE WORN TO SURGERY AND IN MOST CASES-HEARING AIDS WILL NEED TO BE REMOVED.  BRING YOUR GLASSES CASE, ANY EQUIPMENT NEEDED FOR YOUR CONTACT LENS. FOR PATIENTS ADMITTED TO THE HOSPITAL--CHECK OUT TIME THE DAY OF DISCHARGE IS 11:00 AM.  ALL INPATIENT ROOMS ARE PRIVATE - WITH BATHROOM, TELEPHONE, TELEVISION AND WIFI INTERNET.                                                    PLEASE BE AWARE THAT YOU MAY NEED ADDITIONAL BLOOD DRAWN DAY OF YOUR SURGERY  _______________________________________________________________________   [facility] - Preparing for Surgery Before surgery, you can play an important role.  Because skin is not sterile, your skin needs to be as free of germs as possible.  You can reduce the number of germs on your skin by washing with CHG (chlorahexidine gluconate) soap before surgery.  CHG is an antiseptic cleaner which kills germs and bonds with the skin to continue killing germs even after washing. Please DO NOT use if you have an allergy to CHG or antibacterial soaps.  If your skin becomes reddened/irritated stop using the CHG and inform your nurse when you arrive at Short  Stay. Do not shave (including legs and underarms) for at least 48 hours prior to the first CHG shower.  You may shave your face/neck. Please follow these instructions carefully:  1.  Shower with CHG Soap the night before surgery and the  morning of Surgery.  2.  If you choose to wash your hair, wash your hair first as usual with your  normal  shampoo.  3.  After you shampoo, rinse your hair and body thoroughly to remove the  shampoo.                           4.  Use CHG as you would any other liquid soap.  You can apply chg directly  to the skin and wash                       Gently with a scrungie or clean washcloth.  5.  Apply the CHG Soap to your body ONLY FROM THE NECK DOWN.   Do not use on face/ open                           Wound  or open sores. Avoid contact with eyes, ears mouth and genitals (private parts).                       Wash face,  Genitals (private parts) with your normal soap.             6.  Wash thoroughly, paying special attention to the area where your surgery  will be performed.  7.  Thoroughly rinse your body with warm water from the neck down.  8.  DO NOT shower/wash with your normal soap after using and rinsing off  the CHG Soap.                9.  Pat yourself dry with a clean towel.            10.  Wear clean pajamas.            11.  Place clean sheets on your bed the night of your first shower and do not  sleep with pets. Day of Surgery : Do not apply any lotions/deodorants the morning of surgery.  Please wear clean clothes to the hospital/surgery center.  FAILURE TO FOLLOW THESE INSTRUCTIONS MAY RESULT IN THE CANCELLATION OF YOUR SURGERY PATIENT SIGNATURE_________________________________  NURSE SIGNATURE__________________________________  ________________________________________________________________________

## 2013-10-19 NOTE — Pre-Procedure Instructions (Signed)
EKG AND CXR REPORTS IN EPIC FROM 07/22/13

## 2013-10-20 ENCOUNTER — Ambulatory Visit (INDEPENDENT_AMBULATORY_CARE_PROVIDER_SITE_OTHER): Payer: Managed Care, Other (non HMO) | Admitting: General Surgery

## 2013-10-20 ENCOUNTER — Encounter (INDEPENDENT_AMBULATORY_CARE_PROVIDER_SITE_OTHER): Payer: Self-pay | Admitting: General Surgery

## 2013-10-20 MED ORDER — OXYCODONE HCL 5 MG/5ML PO SOLN: 5.0000 mg | ORAL | Status: DC | PRN

## 2013-10-20 NOTE — Progress Notes (Signed)
Chief complaint: Preop gastric bypass  History: Patient returns for her preop visit prior to complete workup for planned laparoscopic Roux-en-Y gastric bypass for morbid obesity and multiple comorbidities including early or prediabetes, stress urinary incontinence, joint pain, probable mild sleep apnea and polycystic ovarian syndrome. She has successfully completed her preoperative workup. She has significant depression but we have clearance from her psychologist that she feels she is stable to proceed with gastric bypass. This issue has been under control per patient. Ultrasound within the last several years shows no gallstones. Lab work was unremarkable. No concerns on nutrition evaluation. She has started her preoperative diet. She has been feeling okay with no intercurrent illnesses or infections.  Past Medical History  Diagnosis Date  . Obesity   . Skin lesion     Excisional biopsy of moles - none cancerous  . Polycystic ovary     takes metformin to treat  . Anxiety   . Chronic headaches   . History of pneumonia     x 3  years ago - no recent problems  . Benign juvenile melanoma   . Colon polyps     found on colonoscopy 04/26/2012  . Complication of anesthesia     itching after epidural for c section  . Constipation   . Pneumonia   . Heart murmur     as a child - no one mentions hearing murmur anymore  . Sleep apnea     yrs ago - diagnosed mild sleep apnea - did not have to use cpap   . Heartburn     no meds  . Depression     several suicide attempts, hospitaluzed in 2012 for this; hx pf ECT treatments ; pt sees Dr. Adele Schilder pyschiatrist  and doing well on medication   Past Surgical History  Procedure Laterality Date  . Tonsillectomy  age 29  . Cystoscopy with urethral dilatation  age 67   . Skin lesion excision      back  . Cesarean section  2004, 2007    x 2  . Mole excision      "benign juvenile melanoma" removed from left leg - inner thigh  . Breath tek h pylori N/A  07/22/2013    Procedure: BREATH TEK H PYLORI;  Surgeon: Edward Jolly, MD;  Location: [facility];  Service: General;  Laterality: N/A;   Current Outpatient Prescriptions  Medication Sig Dispense Refill  . buPROPion (WELLBUTRIN XL) 150 MG 24 hr tablet Take 150 mg by mouth every morning.      Marland Kitchen FLUoxetine (PROZAC) 40 MG capsule Take 40 mg by mouth every morning.      . magnesium oxide (MAG-OX) 400 MG tablet Take 400 mg by mouth daily.      . metFORMIN (GLUCOPHAGE) 500 MG tablet Take 1 tablet (500 mg total) by mouth 2 (two) times daily with a meal.  60 tablet  11  . Multiple Vitamin (MULTIVITAMIN WITH MINERALS) TABS tablet Take 1 tablet by mouth every morning.      . temazepam (RESTORIL) 30 MG capsule Take 30 mg by mouth at bedtime as needed for sleep.      Marland Kitchen topiramate (TOPAMAX) 50 MG tablet Take 50 mg by mouth 2 (two) times daily.      . ziprasidone (GEODON) 80 MG capsule Take 80 mg by mouth 2 (two) times daily with a meal.      . oxyCODONE (ROXICODONE) 5 MG/5ML solution Take 5-10 mLs (5-10 mg total) by mouth every  4 (four) hours as needed.  250 mL  0   No current facility-administered medications for this visit.   Allergies  Allergen Reactions  . Lithium     Catatonic state  . Other     Itching with epidural for c-section - pt thinks morphine was being given  . Penicillins     REACTION: Rash   History  Substance Use Topics  . Smoking status: Former Smoker -- 1.00 packs/day for 5 years    Types: Cigarettes    Quit date: 08/26/1996  . Smokeless tobacco: Never Used  . Alcohol Use: No   Exam: BP 118/78  Pulse 84  Resp 16  Ht 5\' 5"  (1.651 m)  Wt 260 lb 6.4 oz (118.117 kg)  BMI 43.33 kg/m2  LMP 10/15/2013 General: Mild flat affect, no distress Skin: No rashes or infections HEENT: No palpable masses or thyromegaly sclera nonicteric Lungs: Clear good breath sounds bilaterally Cardiac: Regular rate and rhythm. No edema Abdomen: Soft and nontender. No masses organomegaly  or hernias Extremities: No edema Neuro: Alert and oriented. Gait normal  Assessment and plan: Ready to proceed with planned arthroscopic Roux-en-Y gastric bypass. She is accompanied by her husband today. We discussed the surgery and expected recovery period we reviewed the consent form and all their questions were answered. She received her preoperative bowel prep and postoperative pain medicine prescription.

## 2013-10-24 ENCOUNTER — Telehealth: Payer: Self-pay

## 2013-10-24 ENCOUNTER — Ambulatory Visit (INDEPENDENT_AMBULATORY_CARE_PROVIDER_SITE_OTHER): Payer: Managed Care, Other (non HMO)

## 2013-10-24 VITALS — BP 106/72 | Temp 101.3°F | Wt 257.0 lb

## 2013-10-24 DIAGNOSIS — N3000 Acute cystitis without hematuria: Secondary | ICD-10-CM | POA: Diagnosis not present

## 2013-10-24 LAB — POCT URINALYSIS DIPSTICK
Bilirubin, UA: NEGATIVE
Glucose, UA: NEGATIVE
KETONES UA: 40
Leukocytes, UA: NEGATIVE
Nitrite, UA: NEGATIVE
PH UA: 6.5
RBC UA: NEGATIVE
Spec Grav, UA: 1.02
Urobilinogen, UA: 0.2

## 2013-10-24 LAB — CBC
HCT: 40.7 % (ref 36.0–46.0)
Hemoglobin: 13.9 g/dL (ref 12.0–15.0)
MCH: 31.2 pg (ref 26.0–34.0)
MCHC: 34.2 g/dL (ref 30.0–36.0)
MCV: 91.5 fL (ref 78.0–100.0)
PLATELETS: 371 10*3/uL (ref 150–400)
RBC: 4.45 MIL/uL (ref 3.87–5.11)
RDW: 13.8 % (ref 11.5–15.5)
WBC: 14.1 10*3/uL — ABNORMAL HIGH (ref 4.0–10.5)

## 2013-10-24 MED ORDER — CIPROFLOXACIN HCL 500 MG PO TABS: 500.0000 mg | ORAL_TABLET | Freq: Two times a day (BID) | ORAL | Status: DC

## 2013-10-24 NOTE — Progress Notes (Signed)
Patient in for nurse visit for assessment of UTI.  POCT urinalysis performed with negative results for UTI.  MD notified verbally.  Patient given order for STAT CBC due to fever.  Verbal order given for Cipro 500mg  BID #6 due to symptoms.  Urine sent for culture.  Follow up to be made on culture results when available.

## 2013-10-24 NOTE — Telephone Encounter (Signed)
Noted and agree with all of the above

## 2013-10-24 NOTE — Addendum Note (Signed)
Addended by: Denman George B on: 10/24/2013 12:56 PM   Modules accepted: Orders

## 2013-10-24 NOTE — Telephone Encounter (Signed)
Noted. And patient in for nurse visit.

## 2013-10-24 NOTE — Telephone Encounter (Signed)
Patient will come in for nurse visit for POCT urinalysis.  Temp to be accessed.  Will order stat CBC due to reported fever.  Cipro to be started if in house urinalysis positive.

## 2013-10-25 ENCOUNTER — Telehealth (INDEPENDENT_AMBULATORY_CARE_PROVIDER_SITE_OTHER): Payer: Self-pay

## 2013-10-25 NOTE — Telephone Encounter (Signed)
Message copied by Ivor Costa on Tue Oct 25, 2013  3:34 PM ------      Message from: Crisoforo Oxford      Created: Tue Oct 25, 2013  2:01 PM      Contact: [phone]       Pt advised has surgery scheduled for Mon. she is currently running a fever and pcp has put her on antibiotics. She just wanted [place] to know in case anything with surgery date needed to be changed.  ty Karna Christmas  ------

## 2013-10-25 NOTE — Telephone Encounter (Signed)
Called and spoke to patient regarding surgery on Monday 10/31/13 for RNY.  Patients WBC elevated from 10/24/13 labs.  Patient to have CBC redrawn on Thursday, we will await results.

## 2013-10-26 LAB — URINE CULTURE

## 2013-10-27 ENCOUNTER — Encounter: Payer: Self-pay | Admitting: Family Medicine

## 2013-10-27 DIAGNOSIS — N3000 Acute cystitis without hematuria: Secondary | ICD-10-CM | POA: Diagnosis not present

## 2013-10-27 LAB — CBC WITH DIFFERENTIAL/PLATELET
BASOS PCT: 0 % (ref 0–1)
Basophils Absolute: 0 10*3/uL (ref 0.0–0.1)
EOS ABS: 0.1 10*3/uL (ref 0.0–0.7)
EOS PCT: 1 % (ref 0–5)
HEMATOCRIT: 41.3 % (ref 36.0–46.0)
HEMOGLOBIN: 13.9 g/dL (ref 12.0–15.0)
LYMPHS ABS: 1.6 10*3/uL (ref 0.7–4.0)
Lymphocytes Relative: 16 % (ref 12–46)
MCH: 30.6 pg (ref 26.0–34.0)
MCHC: 33.7 g/dL (ref 30.0–36.0)
MCV: 91 fL (ref 78.0–100.0)
MONO ABS: 0.6 10*3/uL (ref 0.1–1.0)
MONOS PCT: 6 % (ref 3–12)
NEUTROS PCT: 77 % (ref 43–77)
Neutro Abs: 7.5 10*3/uL (ref 1.7–7.7)
Platelets: 400 10*3/uL (ref 150–400)
RBC: 4.54 MIL/uL (ref 3.87–5.11)
RDW: 13.7 % (ref 11.5–15.5)
WBC: 9.8 10*3/uL (ref 4.0–10.5)

## 2013-10-31 ENCOUNTER — Inpatient Hospital Stay (HOSPITAL_COMMUNITY): Payer: Managed Care, Other (non HMO) | Admitting: Anesthesiology

## 2013-10-31 ENCOUNTER — Encounter (HOSPITAL_COMMUNITY): Payer: Managed Care, Other (non HMO) | Admitting: Anesthesiology

## 2013-10-31 ENCOUNTER — Encounter (HOSPITAL_COMMUNITY)
Admission: RE | Disposition: A | Discharge status: Home / Self Care | Payer: Self-pay | Source: Ambulatory Visit | Attending: General Surgery

## 2013-10-31 ENCOUNTER — Encounter (HOSPITAL_COMMUNITY): Payer: Self-pay | Admitting: *Deleted

## 2013-10-31 ENCOUNTER — Inpatient Hospital Stay (HOSPITAL_COMMUNITY)
Admission: RE | Admit: 2013-10-31 | Discharge: 2013-11-02 | DRG: 621 | Disposition: A | Discharge status: Home / Self Care | Payer: Managed Care, Other (non HMO) | Source: Ambulatory Visit | Attending: General Surgery | Admitting: General Surgery

## 2013-10-31 DIAGNOSIS — N393 Stress incontinence (female) (male): Secondary | ICD-10-CM | POA: Diagnosis present

## 2013-10-31 DIAGNOSIS — R7309 Other abnormal glucose: Secondary | ICD-10-CM | POA: Diagnosis present

## 2013-10-31 DIAGNOSIS — Z87891 Personal history of nicotine dependence: Secondary | ICD-10-CM

## 2013-10-31 DIAGNOSIS — G4730 Sleep apnea, unspecified: Secondary | ICD-10-CM | POA: Diagnosis present

## 2013-10-31 DIAGNOSIS — G4733 Obstructive sleep apnea (adult) (pediatric): Secondary | ICD-10-CM

## 2013-10-31 DIAGNOSIS — R933 Abnormal findings on diagnostic imaging of other parts of digestive tract: Secondary | ICD-10-CM | POA: Diagnosis not present

## 2013-10-31 DIAGNOSIS — Z6841 Body Mass Index (BMI) 40.0 and over, adult: Secondary | ICD-10-CM

## 2013-10-31 DIAGNOSIS — Z01812 Encounter for preprocedural laboratory examination: Secondary | ICD-10-CM | POA: Diagnosis not present

## 2013-10-31 HISTORY — PX: GASTRIC ROUX-EN-Y: SHX5262

## 2013-10-31 LAB — HEMOGLOBIN AND HEMATOCRIT, BLOOD
HEMATOCRIT: 36.6 % (ref 36.0–46.0)
HEMOGLOBIN: 12.2 g/dL (ref 12.0–15.0)

## 2013-10-31 LAB — PREGNANCY, URINE: PREG TEST UR: NEGATIVE

## 2013-10-31 SURGERY — LAPAROSCOPIC ROUX-EN-Y GASTRIC BYPASS WITH UPPER ENDOSCOPY
Anesthesia: General | Site: Abdomen

## 2013-10-31 MED ORDER — PROMETHAZINE HCL 25 MG/ML IJ SOLN
INTRAMUSCULAR | Status: AC
  Filled 2013-10-31: qty 1

## 2013-10-31 MED ORDER — PROPOFOL 10 MG/ML IV BOLUS
INTRAVENOUS | Status: DC | PRN
  Administered 2013-10-31: 150 mg via INTRAVENOUS

## 2013-10-31 MED ORDER — ONDANSETRON HCL 4 MG/2ML IJ SOLN
INTRAMUSCULAR | Status: AC
  Filled 2013-10-31: qty 2

## 2013-10-31 MED ORDER — LACTATED RINGERS IV SOLN
INTRAVENOUS | Status: DC | PRN
  Administered 2013-10-31 (×3): via INTRAVENOUS

## 2013-10-31 MED ORDER — KETOROLAC TROMETHAMINE 30 MG/ML IJ SOLN: 15.0000 mg | Freq: Once | INTRAMUSCULAR | Status: DC | PRN

## 2013-10-31 MED ORDER — MORPHINE SULFATE 2 MG/ML IJ SOLN
2.0000 mg | INTRAMUSCULAR | Status: DC | PRN
  Administered 2013-10-31: 2 mg via INTRAVENOUS
  Administered 2013-10-31 – 2013-11-01 (×6): 4 mg via INTRAVENOUS
  Filled 2013-10-31: qty 2
  Filled 2013-10-31: qty 1
  Filled 2013-10-31 (×5): qty 2

## 2013-10-31 MED ORDER — PROPOFOL 10 MG/ML IV BOLUS
INTRAVENOUS | Status: AC
  Filled 2013-10-31: qty 20

## 2013-10-31 MED ORDER — LIDOCAINE HCL (CARDIAC) 20 MG/ML IV SOLN
INTRAVENOUS | Status: DC | PRN
  Administered 2013-10-31: 50 mg via INTRAVENOUS

## 2013-10-31 MED ORDER — ACETAMINOPHEN 10 MG/ML IV SOLN
INTRAVENOUS | Status: DC | PRN
Start: 2013-10-31 — End: 2013-10-31
  Administered 2013-10-31: 1000 mg via INTRAVENOUS

## 2013-10-31 MED ORDER — LEVOFLOXACIN IN D5W 750 MG/150ML IV SOLN
750.0000 mg | INTRAVENOUS | Status: AC
  Administered 2013-10-31: 750 mg via INTRAVENOUS
  Filled 2013-10-31: qty 150

## 2013-10-31 MED ORDER — HYDROMORPHONE HCL PF 2 MG/ML IJ SOLN
INTRAMUSCULAR | Status: AC
  Filled 2013-10-31: qty 1

## 2013-10-31 MED ORDER — BUPIVACAINE-EPINEPHRINE (PF) 0.25% -1:200000 IJ SOLN
INTRAMUSCULAR | Status: AC
  Filled 2013-10-31: qty 30

## 2013-10-31 MED ORDER — FENTANYL CITRATE 0.05 MG/ML IJ SOLN
INTRAMUSCULAR | Status: DC | PRN
  Administered 2013-10-31 (×2): 50 ug via INTRAVENOUS
  Administered 2013-10-31: 100 ug via INTRAVENOUS
  Administered 2013-10-31: 50 ug via INTRAVENOUS

## 2013-10-31 MED ORDER — OXYCODONE HCL 5 MG/5ML PO SOLN
5.0000 mg | ORAL | Status: DC | PRN
  Administered 2013-11-01 – 2013-11-02 (×6): 10 mg via ORAL
  Filled 2013-10-31 (×6): qty 10

## 2013-10-31 MED ORDER — ROCURONIUM BROMIDE 100 MG/10ML IV SOLN
INTRAVENOUS | Status: AC
  Filled 2013-10-31: qty 1

## 2013-10-31 MED ORDER — SUCCINYLCHOLINE CHLORIDE 20 MG/ML IJ SOLN
INTRAMUSCULAR | Status: DC | PRN
  Administered 2013-10-31: 140 mg via INTRAVENOUS

## 2013-10-31 MED ORDER — HYDROMORPHONE HCL PF 1 MG/ML IJ SOLN
INTRAMUSCULAR | Status: DC | PRN
  Administered 2013-10-31 (×2): 0.5 mg via INTRAVENOUS
  Administered 2013-10-31: 1 mg via INTRAVENOUS

## 2013-10-31 MED ORDER — FENTANYL CITRATE 0.05 MG/ML IJ SOLN
INTRAMUSCULAR | Status: AC
  Filled 2013-10-31: qty 5

## 2013-10-31 MED ORDER — HYDROMORPHONE HCL PF 1 MG/ML IJ SOLN
0.2500 mg | INTRAMUSCULAR | Status: DC | PRN
  Administered 2013-10-31 (×2): 0.5 mg via INTRAVENOUS

## 2013-10-31 MED ORDER — GLYCOPYRROLATE 0.2 MG/ML IJ SOLN
INTRAMUSCULAR | Status: AC
  Filled 2013-10-31: qty 3

## 2013-10-31 MED ORDER — ONDANSETRON HCL 4 MG/2ML IJ SOLN
INTRAMUSCULAR | Status: DC | PRN
  Administered 2013-10-31: 4 mg via INTRAVENOUS

## 2013-10-31 MED ORDER — LACTATED RINGERS IV SOLN: INTRAVENOUS | Status: DC

## 2013-10-31 MED ORDER — POTASSIUM CHLORIDE IN NACL 20-0.9 MEQ/L-% IV SOLN
INTRAVENOUS | Status: DC
  Administered 2013-10-31 – 2013-11-02 (×4): via INTRAVENOUS
  Filled 2013-10-31 (×7): qty 1000

## 2013-10-31 MED ORDER — UNJURY VANILLA POWDER: 2.0000 [oz_av] | Freq: Four times a day (QID) | ORAL | Status: DC

## 2013-10-31 MED ORDER — NEOSTIGMINE METHYLSULFATE 10 MG/10ML IV SOLN
INTRAVENOUS | Status: DC | PRN
  Administered 2013-10-31: 4 mg via INTRAVENOUS

## 2013-10-31 MED ORDER — DEXAMETHASONE SODIUM PHOSPHATE 10 MG/ML IJ SOLN
INTRAMUSCULAR | Status: AC
  Filled 2013-10-31: qty 1

## 2013-10-31 MED ORDER — NEOSTIGMINE METHYLSULFATE 10 MG/10ML IV SOLN
INTRAVENOUS | Status: AC
  Filled 2013-10-31: qty 1

## 2013-10-31 MED ORDER — PROMETHAZINE HCL 25 MG/ML IJ SOLN
6.2500 mg | INTRAMUSCULAR | Status: AC | PRN
  Administered 2013-10-31: 6.25 mg via INTRAVENOUS
  Administered 2013-10-31: 12.5 mg via INTRAVENOUS

## 2013-10-31 MED ORDER — CHLORHEXIDINE GLUCONATE 4 % EX LIQD: 1.0000 "application " | Freq: Once | CUTANEOUS | Status: DC

## 2013-10-31 MED ORDER — HEPARIN SODIUM (PORCINE) 5000 UNIT/ML IJ SOLN
5000.0000 [IU] | INTRAMUSCULAR | Status: AC
  Administered 2013-10-31: 5000 [IU] via SUBCUTANEOUS
  Filled 2013-10-31: qty 1

## 2013-10-31 MED ORDER — ACETAMINOPHEN 160 MG/5ML PO SOLN: 650.0000 mg | ORAL | Status: DC | PRN

## 2013-10-31 MED ORDER — LIDOCAINE HCL (CARDIAC) 20 MG/ML IV SOLN
INTRAVENOUS | Status: AC
  Filled 2013-10-31: qty 5

## 2013-10-31 MED ORDER — TISSEEL VH 10 ML EX KIT
PACK | CUTANEOUS | Status: DC | PRN
  Administered 2013-10-31: 10 mL

## 2013-10-31 MED ORDER — UNJURY CHOCOLATE CLASSIC POWDER
2.0000 [oz_av] | Freq: Four times a day (QID) | ORAL | Status: DC
  Administered 2013-11-02 (×2): 2 [oz_av] via ORAL

## 2013-10-31 MED ORDER — UNJURY CHICKEN SOUP POWDER: 2.0000 [oz_av] | Freq: Four times a day (QID) | ORAL | Status: DC

## 2013-10-31 MED ORDER — GLYCOPYRROLATE 0.2 MG/ML IJ SOLN
INTRAMUSCULAR | Status: DC | PRN
  Administered 2013-10-31: 0.6 mg via INTRAVENOUS

## 2013-10-31 MED ORDER — HYDROMORPHONE HCL PF 1 MG/ML IJ SOLN
INTRAMUSCULAR | Status: AC
  Filled 2013-10-31: qty 1

## 2013-10-31 MED ORDER — DEXAMETHASONE SODIUM PHOSPHATE 10 MG/ML IJ SOLN
INTRAMUSCULAR | Status: DC | PRN
  Administered 2013-10-31: 10 mg via INTRAVENOUS

## 2013-10-31 MED ORDER — ACETAMINOPHEN 10 MG/ML IV SOLN
1000.0000 mg | Freq: Once | INTRAVENOUS | Status: DC
  Filled 2013-10-31: qty 100

## 2013-10-31 MED ORDER — ROCURONIUM BROMIDE 100 MG/10ML IV SOLN
INTRAVENOUS | Status: DC | PRN
  Administered 2013-10-31 (×3): 10 mg via INTRAVENOUS
  Administered 2013-10-31: 50 mg via INTRAVENOUS
  Administered 2013-10-31: 10 mg via INTRAVENOUS
  Administered 2013-10-31 (×2): 5 mg via INTRAVENOUS

## 2013-10-31 MED ORDER — LACTATED RINGERS IR SOLN
Status: DC | PRN
  Administered 2013-10-31: 1000 mL

## 2013-10-31 MED ORDER — MIDAZOLAM HCL 2 MG/2ML IJ SOLN
INTRAMUSCULAR | Status: AC
  Filled 2013-10-31: qty 2

## 2013-10-31 MED ORDER — ACETAMINOPHEN 160 MG/5ML PO SOLN: 325.0000 mg | ORAL | Status: DC | PRN

## 2013-10-31 MED ORDER — HEPARIN SODIUM (PORCINE) 5000 UNIT/ML IJ SOLN
5000.0000 [IU] | Freq: Three times a day (TID) | INTRAMUSCULAR | Status: DC
  Administered 2013-10-31 – 2013-11-02 (×6): 5000 [IU] via SUBCUTANEOUS
  Filled 2013-10-31 (×8): qty 1

## 2013-10-31 MED ORDER — ONDANSETRON HCL 4 MG/2ML IJ SOLN
4.0000 mg | INTRAMUSCULAR | Status: DC | PRN
  Administered 2013-10-31 – 2013-11-02 (×3): 4 mg via INTRAVENOUS
  Filled 2013-10-31 (×3): qty 2

## 2013-10-31 MED ORDER — MIDAZOLAM HCL 5 MG/5ML IJ SOLN
INTRAMUSCULAR | Status: DC | PRN
  Administered 2013-10-31: 2 mg via INTRAVENOUS

## 2013-10-31 MED ORDER — TISSEEL VH 10 ML EX KIT
PACK | CUTANEOUS | Status: AC
  Filled 2013-10-31: qty 1

## 2013-10-31 SURGICAL SUPPLY — 75 items
ADH SKN CLS APL DERMABOND .7 (GAUZE/BANDAGES/DRESSINGS) ×1
APL SRG 32X5 SNPLK LF DISP (MISCELLANEOUS) ×1
APPLICATOR COTTON TIP 6IN STRL (MISCELLANEOUS) ×4 IMPLANT
APPLIER CLIP ROT 13.4 12 LRG (CLIP)
APR CLP LRG 13.4X12 ROT 20 MLT (CLIP)
BAG SPEC RTRVL LRG 6X4 10 (ENDOMECHANICALS)
BLADE SURG SZ11 CARB STEEL (BLADE) ×2 IMPLANT
CABLE HIGH FREQUENCY MONO STRZ (ELECTRODE) ×1 IMPLANT
CANISTER SUCTION 2500CC (MISCELLANEOUS) ×2 IMPLANT
CHLORAPREP W/TINT 26ML (MISCELLANEOUS) ×2 IMPLANT
CLIP APPLIE ROT 13.4 12 LRG (CLIP) IMPLANT
CLIP SUT LAPRA TY ABSORB (SUTURE) ×4 IMPLANT
DECANTER SPIKE VIAL GLASS SM (MISCELLANEOUS) ×2 IMPLANT
DERMABOND ADVANCED (GAUZE/BANDAGES/DRESSINGS) ×1
DERMABOND ADVANCED .7 DNX12 (GAUZE/BANDAGES/DRESSINGS) ×1 IMPLANT
DEVICE SUTURE ENDOST 10MM (ENDOMECHANICALS) IMPLANT
DISSECTOR BLUNT TIP ENDO 5MM (MISCELLANEOUS) IMPLANT
DRAIN PENROSE 18X1/4 LTX STRL (WOUND CARE) ×2 IMPLANT
DRAPE CAMERA CLOSED 9X96 (DRAPES) ×2 IMPLANT
ELECT REM PT RETURN 9FT ADLT (ELECTROSURGICAL) ×2
ELECTRODE REM PT RTRN 9FT ADLT (ELECTROSURGICAL) ×1 IMPLANT
GAUZE SPONGE 4X4 12PLY STRL (GAUZE/BANDAGES/DRESSINGS) ×2 IMPLANT
GAUZE SPONGE 4X4 16PLY XRAY LF (GAUZE/BANDAGES/DRESSINGS) ×2 IMPLANT
GLOVE BIOGEL PI IND STRL 7.5 (GLOVE) ×1 IMPLANT
GLOVE BIOGEL PI INDICATOR 7.5 (GLOVE) ×2
GLOVE SS BIOGEL STRL SZ 7.5 (GLOVE) ×1 IMPLANT
GLOVE SUPERSENSE BIOGEL SZ 7.5 (GLOVE) ×1
GOWN STRL REUS W/TWL XL LVL3 (GOWN DISPOSABLE) ×6 IMPLANT
HEMOSTAT SURGICEL 4X8 (HEMOSTASIS) IMPLANT
HOVERMATT SINGLE USE (MISCELLANEOUS) ×2 IMPLANT
KIT BASIN OR (CUSTOM PROCEDURE TRAY) ×2 IMPLANT
KIT GASTRIC LAVAGE 34FR ADT (SET/KITS/TRAYS/PACK) ×2 IMPLANT
NDL SPNL 22GX3.5 QUINCKE BK (NEEDLE) ×1 IMPLANT
NEEDLE SPNL 22GX3.5 QUINCKE BK (NEEDLE) ×2 IMPLANT
PACK CARDIOVASCULAR III (CUSTOM PROCEDURE TRAY) ×2 IMPLANT
PEN SKIN MARKING BROAD (MISCELLANEOUS) ×2 IMPLANT
POUCH SPECIMEN RETRIEVAL 10MM (ENDOMECHANICALS) IMPLANT
RELOAD 45 VASCULAR/THIN (ENDOMECHANICALS) ×2 IMPLANT
RELOAD BLUE (STAPLE) ×5 IMPLANT
RELOAD ENDO STITCH 2.0 (ENDOMECHANICALS)
RELOAD GOLD (STAPLE) ×2 IMPLANT
RELOAD STAPLE 45 2.5 WHT GRN (ENDOMECHANICALS) ×1 IMPLANT
RELOAD STAPLE 45 3.5 BLU ETS (ENDOMECHANICALS) ×1 IMPLANT
RELOAD STAPLE TA45 3.5 REG BLU (ENDOMECHANICALS) ×4 IMPLANT
RELOAD SUT SNGL STCH ABSRB 2-0 (ENDOMECHANICALS) IMPLANT
RELOAD SUT SNGL STCH BLK 2-0 (ENDOMECHANICALS) IMPLANT
RELOAD WHITE ECR60W (STAPLE) ×2 IMPLANT
SCISSORS LAP 5X35 DISP (ENDOMECHANICALS) ×2 IMPLANT
SEALANT SURGICAL APPL DUAL CAN (MISCELLANEOUS) ×2 IMPLANT
SET IRRIG TUBING LAPAROSCOPIC (IRRIGATION / IRRIGATOR) ×2 IMPLANT
SHEARS CURVED HARMONIC AC 45CM (MISCELLANEOUS) ×2 IMPLANT
SLEEVE ADV FIXATION 12X100MM (TROCAR) ×4 IMPLANT
SOLUTION ANTI FOG 6CC (MISCELLANEOUS) ×2 IMPLANT
STAPLE ECHEON FLEX 60 POW ENDO (STAPLE) ×2 IMPLANT
STAPLER VISISTAT 35W (STAPLE) ×2 IMPLANT
SUT DVC SILK 2.0X39 (SUTURE) ×5 IMPLANT
SUT DVC VICRYL PGA 2.0X39 (SUTURE) ×5 IMPLANT
SUT MNCRL AB 4-0 PS2 18 (SUTURE) ×2 IMPLANT
SUT RELOAD ENDO STITCH 2 48X1 (ENDOMECHANICALS)
SUT RELOAD ENDO STITCH 2.0 (ENDOMECHANICALS)
SUT VIC AB 2-0 SH 27 (SUTURE)
SUT VIC AB 2-0 SH 27X BRD (SUTURE) IMPLANT
SUTURE RELOAD END STTCH 2 48X1 (ENDOMECHANICALS) IMPLANT
SUTURE RELOAD ENDO STITCH 2.0 (ENDOMECHANICALS) IMPLANT
SYRINGE 20CC LL (MISCELLANEOUS) ×4 IMPLANT
SYRINGE 60CC LL (MISCELLANEOUS) ×2 IMPLANT
TOWEL OR 17X26 10 PK STRL BLUE (TOWEL DISPOSABLE) ×2 IMPLANT
TOWEL OR NON WOVEN STRL DISP B (DISPOSABLE) ×2 IMPLANT
TRAY FOLEY CATH 14FRSI W/METER (CATHETERS) ×2 IMPLANT
TROCAR ADV FIXATION 12X100MM (TROCAR) ×2 IMPLANT
TROCAR ADV FIXATION 5X100MM (TROCAR) ×2 IMPLANT
TROCAR BLADELESS OPT 5 100 (ENDOMECHANICALS) ×2 IMPLANT
TROCAR XCEL 12X100 BLDLESS (ENDOMECHANICALS) ×2 IMPLANT
TUBING ENDO SMARTCAP PENTAX (MISCELLANEOUS) ×2 IMPLANT
TUBING FILTER THERMOFLATOR (ELECTROSURGICAL) ×2 IMPLANT

## 2013-10-31 NOTE — Op Note (Signed)
Katelyn [name] [id number] May 18, 1975 10/31/2013  Preoperative diagnosis: gastric bypass  Postoperative diagnosis: Same   Procedure: Upper endoscopy   Surgeon: Catalina Antigua B. Hassell Done  M.D., FACS   Anesthesia: Gen.   Indications for procedure: 38  yo morbid obesity patient  undergoing a laparoscopic roux en y gastric bypass and an upper endoscopy was requested to evaluate the anastomosis.  Description of procedure: After we have completed the new gastrojejunostomy, I scrubbed out and obtained the Olympus endoscope. I gently placed endoscope in the patient's oropharynx and gently glided it down the esophagus without any difficulty under direct visualization. Once I was in the gastric pouch, I insufflated the pouch was air. The pouch was approximately 4 cm in size. I was able to cannulate and advanced the scope through the gastrojejunostomy. Dr.Hoxworth had placed saline in the upper abdomen. Upon further insufflation of the gastric pouch there was no evidence of bubbles. Upon further inspection of the gastric pouch, the mucosa appeared normal. There is no evidence of any mucosal abnormality. The gastric pouch and Roux limb were decompressed. The width of the gastrojejunal anastomosis was at least 1 cm. The scope was withdrawn. The patient tolerated this portion of the procedure well. Please see Dr Excell Seltzer operative note for details regarding the laparoscopic roux-en-y gastric bypass.  Matt B. Hassell Done, MD, FACS General, Bariatric, & Minimally Invasive Surgery [facility], [place]

## 2013-10-31 NOTE — Anesthesia Postprocedure Evaluation (Signed)
  Anesthesia Post-op Note  Patient: Katelyn [name]  Procedure(s) Performed: Procedure(s) (LRB): LAPAROSCOPIC ROUX-EN-Y GASTRIC BYPASS WITH UPPER ENDOSCOPY (N/A)  Patient Location: PACU  Anesthesia Type: General  Level of Consciousness: awake and alert   Airway and Oxygen Therapy: Patient Spontanous Breathing  Post-op Pain: mild  Post-op Assessment: Post-op Vital signs reviewed, Patient's Cardiovascular Status Stable, Respiratory Function Stable, Patent Airway and No signs of Nausea or vomiting  Last Vitals:  Filed Vitals:   10/31/13 1115  BP: 126/65  Pulse: 84  Temp: 36.7 C  Resp: 9    Post-op Vital Signs: stable   Complications: No apparent anesthesia complications

## 2013-10-31 NOTE — Transfer of Care (Signed)
Immediate Anesthesia Transfer of Care Note  Patient: Katelyn [name]  Procedure(s) Performed: Procedure(s): LAPAROSCOPIC ROUX-EN-Y GASTRIC BYPASS WITH UPPER ENDOSCOPY (N/A)  Patient Location: PACU  Anesthesia Type:General  Level of Consciousness: awake, alert  and oriented  Airway & Oxygen Therapy: Patient Spontanous Breathing and Patient connected to face mask oxygen  Post-op Assessment: Report given to PACU RN and Post -op Vital signs reviewed and stable  Post vital signs: Reviewed and stable  Complications: No apparent anesthesia complications

## 2013-10-31 NOTE — H&P (View-Only) (Signed)
Chief complaint: Preop gastric bypass  History: Patient returns for her preop visit prior to complete workup for planned laparoscopic Roux-en-Y gastric bypass for morbid obesity and multiple comorbidities including early or prediabetes, stress urinary incontinence, joint pain, probable mild sleep apnea and polycystic ovarian syndrome. She has successfully completed her preoperative workup. She has significant depression but we have clearance from her psychologist that she feels she is stable to proceed with gastric bypass. This issue has been under control per patient. Ultrasound within the last several years shows no gallstones. Lab work was unremarkable. No concerns on nutrition evaluation. She has started her preoperative diet. She has been feeling okay with no intercurrent illnesses or infections.  Past Medical History  Diagnosis Date  . Obesity   . Skin lesion     Excisional biopsy of moles - none cancerous  . Polycystic ovary     takes metformin to treat  . Anxiety   . Chronic headaches   . History of pneumonia     x 3  years ago - no recent problems  . Benign juvenile melanoma   . Colon polyps     found on colonoscopy 04/26/2012  . Complication of anesthesia     itching after epidural for c section  . Constipation   . Pneumonia   . Heart murmur     as a child - no one mentions hearing murmur anymore  . Sleep apnea     yrs ago - diagnosed mild sleep apnea - did not have to use cpap   . Heartburn     no meds  . Depression     several suicide attempts, hospitaluzed in 2012 for this; hx pf ECT treatments ; pt sees Dr. Adele Schilder pyschiatrist  and doing well on medication   Past Surgical History  Procedure Laterality Date  . Tonsillectomy  age 87  . Cystoscopy with urethral dilatation  age 11   . Skin lesion excision      back  . Cesarean section  2004, 2007    x 2  . Mole excision      "benign juvenile melanoma" removed from left leg - inner thigh  . Breath tek h pylori N/A  07/22/2013    Procedure: BREATH TEK H PYLORI;  Surgeon: Edward Jolly, MD;  Location: [facility];  Service: General;  Laterality: N/A;   Current Outpatient Prescriptions  Medication Sig Dispense Refill  . buPROPion (WELLBUTRIN XL) 150 MG 24 hr tablet Take 150 mg by mouth every morning.      Marland Kitchen FLUoxetine (PROZAC) 40 MG capsule Take 40 mg by mouth every morning.      . magnesium oxide (MAG-OX) 400 MG tablet Take 400 mg by mouth daily.      . metFORMIN (GLUCOPHAGE) 500 MG tablet Take 1 tablet (500 mg total) by mouth 2 (two) times daily with a meal.  60 tablet  11  . Multiple Vitamin (MULTIVITAMIN WITH MINERALS) TABS tablet Take 1 tablet by mouth every morning.      . temazepam (RESTORIL) 30 MG capsule Take 30 mg by mouth at bedtime as needed for sleep.      Marland Kitchen topiramate (TOPAMAX) 50 MG tablet Take 50 mg by mouth 2 (two) times daily.      . ziprasidone (GEODON) 80 MG capsule Take 80 mg by mouth 2 (two) times daily with a meal.      . oxyCODONE (ROXICODONE) 5 MG/5ML solution Take 5-10 mLs (5-10 mg total) by mouth every  4 (four) hours as needed.  250 mL  0   No current facility-administered medications for this visit.   Allergies  Allergen Reactions  . Lithium     Catatonic state  . Other     Itching with epidural for c-section - pt thinks morphine was being given  . Penicillins     REACTION: Rash   History  Substance Use Topics  . Smoking status: Former Smoker -- 1.00 packs/day for 5 years    Types: Cigarettes    Quit date: 08/26/1996  . Smokeless tobacco: Never Used  . Alcohol Use: No   Exam: BP 118/78  Pulse 84  Resp 16  Ht 5\' 5"  (1.651 m)  Wt 260 lb 6.4 oz (118.117 kg)  BMI 43.33 kg/m2  LMP 10/15/2013 General: Mild flat affect, no distress Skin: No rashes or infections HEENT: No palpable masses or thyromegaly sclera nonicteric Lungs: Clear good breath sounds bilaterally Cardiac: Regular rate and rhythm. No edema Abdomen: Soft and nontender. No masses organomegaly  or hernias Extremities: No edema Neuro: Alert and oriented. Gait normal  Assessment and plan: Ready to proceed with planned arthroscopic Roux-en-Y gastric bypass. She is accompanied by her husband today. We discussed the surgery and expected recovery period we reviewed the consent form and all their questions were answered. She received her preoperative bowel prep and postoperative pain medicine prescription.

## 2013-10-31 NOTE — Op Note (Signed)
Preop diagnosis: Morbid obesity  Postop diagnosis: Morbid obesity  Body mass index is 41.98 kg/(m^2).  Surgical procedure: Laparoscopic Roux-en-Y gastric bypass  Surgeon: Marland Kitchen T.Peyton Rossner M.D.  Asst.: Johnathan Hausen M.D.  Anesthesia: General  Complications:  None  EBL: Minimal  Drains: None  Disposition: PACU in good condition  Description of procedure: Patient is brought to the operating room and general anesthesia induced. She had received preoperative broad-spectrum IV antibiotics and subcutaneous heparin. The abdomen was widely sterilely prepped and draped. Patient timeout was performed and correct patient and procedure confirmed. Access was obtained with a 12 mm Optiview trocar in the left upper quadrant and pneumoperitoneum established without difficulty. Under direct vision 12 mm trocars were placed laterally in the right upper quadrant, right upper quadrant midclavicular line, and to the left and above the umbilicus for the camera port. A 5 mm trocar was placed laterally in the left upper quadrant. The omentum was brought into the upper abdomen and the transverse mesocolon elevated and the ligament of Treitz clearly identified. A 40 cm biliopancreatic limb was then carefully measured from the ligament of Treitz. The small intestine was divided at this point with a single firing of the white load linear stapler. A Penrose drain was sutured to the end of the Roux-en-Y limb for later identification. A 100 cm Roux-en-Y limb was then carefully measured. At this point a side-to-side anastomosis was created between the Roux limb and the end of the biliopancreatic limb. This was accomplished with a single firing of the 45 mm white load linear stapler. The common enterotomy was closed with a running 2-0 Vicryl begun at either end of the enterotomy and tied centrally. The mesenteric defect was then closed with running 2-0 silk. The omentum was then divided with the harmonic scalpel up towards  the transverse colon to allow mobility of the Roux limb toward the gastric pouch. The patient was then placed in steep reversed Trendelenburg. Through a 5 mm subxiphoid site the [facility] retractor was placed and the left lobe of the liver elevated with excellent exposure of the upper stomach and hiatus. The angle of Hiss was then mobilized with the harmonic scalpel. A 4 cm gastric pouch was then carefully measured along the lesser curve of the stomach. Dissection was carried along the lesser curve at this point with the Harmonic scalpel working carefully back toward the lesser sac at right angles to the lesser curve. The free lesser sac was then entered. After being sure all tubes were removed from the stomach an initial firing of the gold load 60 mm linear stapler was fired at right angles across the lesser curve for about 4 cm. The gastric pouch was further mobilized posteriorly and then the pouch was completed with 2 further firings of the 60 mm blue load linear stapler up through the previously dissected angle of His. It was ensured that the pouch was completely mobilized away from the gastric remnant. This created a nice tubular 4-5 cm gastric pouch. The staple line of the gastric remnant was then oversewn with 2-0 silk for hemostasis. The Roux limb was then brought up in an antecolic fashion with the candycane facing to the patient's left without undue tension. The gastrojejunostomy was created with an initial posterior row of 2-0 Vicryl between the Roux limb and the staple line of the gastric pouch. Enterotomies were then made in the gastric pouch and the Roux limb with the harmonic scalpel and at approximately 2-2-1/2 cm anastomosis was created with a single  firing of the blue load linear stapler. The staple line was inspected and was intact without bleeding. The common enterotomy was then closed with running 2-0 Vicryl begun at either end and tied centrally. The wall tube was then easily passed through the  anastomosis and an outer anterior layer of running 2-0 Vicryl was placed. The Ewald tube was removed. With the outlet of the gastrojejunostomy clamped and under saline irrigation the assistant performed upper endoscopy and with the gastric pouch tensely distended with air there was no evidence of leak. The pouch was desufflated. The Terance Hart defect was closed with running 2-0 silk. The abdomen was inspected for any evidence of bleeding or bowel injury and everything looked fine. The Nathanson retractor was removed under direct vision after coating the anastomosis with Tisseel tissue sealant. All CO2 was evacuated and trochars removed. Skin incisions were closed with staples. Sponge needle and instrument counts were correct. The patient was taken to the PACU in good condition.     Edward Jolly MD, FACS  10/31/2013, 10:47 AM

## 2013-10-31 NOTE — Interval H&P Note (Signed)
History and Physical Interval Note:  10/31/2013 6:56 AM  Katelyn [name]  has presented today for surgery, with the diagnosis of morbid obesity   The various methods of treatment have been discussed with the patient and family. After consideration of risks, benefits and other options for treatment, the patient has consented to  Procedure(s): LAPAROSCOPIC ROUX-EN-Y GASTRIC BYPASS WITH UPPER ENDOSCOPY (N/A) as a surgical intervention .  The patient's history has been reviewed, patient examined, no change in status, stable for surgery.  I have reviewed the patient's chart and labs.  Questions were answered to the patient's satisfaction.     Stephenie Navejas T

## 2013-10-31 NOTE — Anesthesia Preprocedure Evaluation (Signed)
Anesthesia Evaluation  Patient identified by MRN, date of birth, ID band Patient awake    Reviewed: Allergy & Precautions, H&P , NPO status , Patient's Chart, lab work & pertinent test results  Airway Mallampati: II TM Distance: >3 FB Neck ROM: Full    Dental no notable dental hx.    Pulmonary neg pulmonary ROS, former smoker,  breath sounds clear to auscultation  Pulmonary exam normal       Cardiovascular negative cardio ROS  Rhythm:Regular Rate:Normal     Neuro/Psych Depression negative neurological ROS     GI/Hepatic negative GI ROS, Neg liver ROS,   Endo/Other  Morbid obesity  Renal/GU negative Renal ROS  negative genitourinary   Musculoskeletal negative musculoskeletal ROS (+)   Abdominal   Peds negative pediatric ROS (+)  Hematology negative hematology ROS (+)   Anesthesia Other Findings   Reproductive/Obstetrics negative OB ROS                           Anesthesia Physical Anesthesia Plan  ASA: II  Anesthesia Plan: General   Post-op Pain Management:    Induction: Intravenous  Airway Management Planned: Oral ETT  Additional Equipment:   Intra-op Plan:   Post-operative Plan: Extubation in OR  Informed Consent: I have reviewed the patients History and Physical, chart, labs and discussed the procedure including the risks, benefits and alternatives for the proposed anesthesia with the patient or authorized representative who has indicated his/her understanding and acceptance.   Dental advisory given  Plan Discussed with: CRNA and Surgeon  Anesthesia Plan Comments:         Anesthesia Quick Evaluation

## 2013-11-01 ENCOUNTER — Inpatient Hospital Stay (HOSPITAL_COMMUNITY): Payer: Managed Care, Other (non HMO)

## 2013-11-01 ENCOUNTER — Encounter (HOSPITAL_COMMUNITY): Payer: Self-pay | Admitting: General Surgery

## 2013-11-01 LAB — CBC WITH DIFFERENTIAL/PLATELET
BASOS ABS: 0 10*3/uL (ref 0.0–0.1)
BASOS PCT: 0 % (ref 0–1)
Eosinophils Absolute: 0 10*3/uL (ref 0.0–0.7)
Eosinophils Relative: 0 % (ref 0–5)
HEMATOCRIT: 35.7 % — AB (ref 36.0–46.0)
Hemoglobin: 11.4 g/dL — ABNORMAL LOW (ref 12.0–15.0)
Lymphocytes Relative: 15 % (ref 12–46)
Lymphs Abs: 1.9 10*3/uL (ref 0.7–4.0)
MCH: 29.7 pg (ref 26.0–34.0)
MCHC: 31.9 g/dL (ref 30.0–36.0)
MCV: 93 fL (ref 78.0–100.0)
MONO ABS: 1 10*3/uL (ref 0.1–1.0)
Monocytes Relative: 8 % (ref 3–12)
NEUTROS ABS: 9.5 10*3/uL — AB (ref 1.7–7.7)
NEUTROS PCT: 77 % (ref 43–77)
PLATELETS: 363 10*3/uL (ref 150–400)
RBC: 3.84 MIL/uL — ABNORMAL LOW (ref 3.87–5.11)
RDW: 13.8 % (ref 11.5–15.5)
WBC: 12.4 10*3/uL — AB (ref 4.0–10.5)

## 2013-11-01 LAB — HEMOGLOBIN AND HEMATOCRIT, BLOOD
HEMATOCRIT: 36.2 % (ref 36.0–46.0)
HEMOGLOBIN: 11.7 g/dL — AB (ref 12.0–15.0)

## 2013-11-01 MED ORDER — TEMAZEPAM 30 MG PO CAPS
30.0000 mg | ORAL_CAPSULE | Freq: Every evening | ORAL | Status: DC | PRN
  Administered 2013-11-01: 30 mg via ORAL
  Filled 2013-11-01: qty 2
  Filled 2013-11-01: qty 1

## 2013-11-01 MED ORDER — BUPROPION HCL ER (XL) 150 MG PO TB24
150.0000 mg | ORAL_TABLET | Freq: Every morning | ORAL | Status: DC
  Administered 2013-11-02: 150 mg via ORAL
  Filled 2013-11-01 (×2): qty 1

## 2013-11-01 MED ORDER — IOHEXOL 300 MG/ML  SOLN
50.0000 mL | Freq: Once | INTRAMUSCULAR | Status: AC | PRN
  Administered 2013-11-01: 20 mL via ORAL

## 2013-11-01 MED ORDER — FLUOXETINE HCL 20 MG PO CAPS
40.0000 mg | ORAL_CAPSULE | Freq: Every morning | ORAL | Status: DC
  Administered 2013-11-02: 40 mg via ORAL
  Filled 2013-11-01 (×2): qty 2

## 2013-11-01 MED ORDER — TOPIRAMATE 25 MG PO TABS
50.0000 mg | ORAL_TABLET | Freq: Two times a day (BID) | ORAL | Status: DC
  Administered 2013-11-01 – 2013-11-02 (×2): 50 mg via ORAL
  Filled 2013-11-01 (×4): qty 2

## 2013-11-01 MED ORDER — ZIPRASIDONE HCL 80 MG PO CAPS
80.0000 mg | ORAL_CAPSULE | Freq: Two times a day (BID) | ORAL | Status: DC
Start: 2013-11-01 — End: 2013-11-02
  Administered 2013-11-02: 80 mg via ORAL
  Filled 2013-11-01 (×5): qty 1

## 2013-11-01 NOTE — Care Management Note (Signed)
    Page 1 of 1   11/01/2013     2:19:11 PM CARE MANAGEMENT NOTE 11/01/2013  Patient:  Katelyn [name], Katelyn [name]   Account Number:  [id number]  Date Initiated:  11/01/2013  Documentation initiated by:  Sunday Spillers  Subjective/Objective Assessment:   38 yo female admitted s/p gastric bypass. PTA lived at home with spouse.     Action/Plan:   Home when stable   Anticipated DC Date:  11/03/2013   Anticipated DC Plan:  [place]  CM consult      Choice offered to / List presented to:             Status of service:  Completed, signed off Medicare Important Message given?  NA - LOS <3 / Initial given by admissions (If response is "NO", the following Medicare IM given date fields will be blank) Date Medicare IM given:   Medicare IM given by:   Date Additional Medicare IM given:   Additional Medicare IM given by:    Discharge Disposition:  HOME/SELF CARE  Per UR Regulation:  Reviewed for med. necessity/level of care/duration of stay  If discussed at [place] of Stay Meetings, dates discussed:    Comments:

## 2013-11-01 NOTE — Progress Notes (Signed)
Patient ID: Katelyn [name], female   DOB: 11/22/1975, 38 y.o.   MRN: [id number] 1 Day Post-Op  Subjective: Some LUQ pain relieved with meds. No nausea. Ambulatory.  No other C/O  Objective: Vital signs in last 24 hours: Temp:  [97.3 F (36.3 C)-98.5 F (36.9 C)] 98.5 F (36.9 C) (07/07 0610) Pulse Rate:  [69-90] 69 (07/07 0610) Resp:  [8-16] 16 (07/07 0610) BP: (106-129)/(56-78) 110/72 mmHg (07/07 0610) SpO2:  [94 %-99 %] 98 % (07/07 0610) Last BM Date: 10/31/13  Intake/Output from previous day: 07/06 0701 - 07/07 0700 In: 3066.7 [I.V.:3066.7] Out: 2000 [Urine:2000] Intake/Output this shift:    General appearance: alert, cooperative and no distress Resp: clear to auscultation bilaterally GI: abnormal findings:  mild tenderness in the LUQ Incision/Wound: Clean and dry  Lab Results:   Recent Labs  10/31/13 1909 11/01/13 0503  WBC  --  12.4*  HGB 12.2 11.4*  HCT 36.6 35.7*  PLT  --  363   BMET No results found for this basename: NA, K, CL, CO2, GLUCOSE, BUN, CREATININE, CALCIUM,  in the last 72 hours   Studies/Results: Swallow study neg for leak  Anti-infectives: Anti-infectives   Start     Dose/Rate Route Frequency Ordered Stop   10/31/13 0509  levofloxacin (LEVAQUIN) IVPB 750 mg     750 mg 100 mL/hr over 90 Minutes Intravenous On call to O.R. 10/31/13 0509 10/31/13 [id number]      Assessment/Plan: s/p Procedure(s): LAPAROSCOPIC ROUX-EN-Y GASTRIC BYPASS WITH UPPER ENDOSCOPY Doing well post op Start water po Resume home psych meds    LOS: 1 day    Cynda Soule T 11/01/2013

## 2013-11-02 LAB — CBC WITH DIFFERENTIAL/PLATELET
BASOS ABS: 0 10*3/uL (ref 0.0–0.1)
Basophils Relative: 0 % (ref 0–1)
Eosinophils Absolute: 0.1 10*3/uL (ref 0.0–0.7)
Eosinophils Relative: 1 % (ref 0–5)
HCT: 34.5 % — ABNORMAL LOW (ref 36.0–46.0)
Hemoglobin: 11.2 g/dL — ABNORMAL LOW (ref 12.0–15.0)
Lymphocytes Relative: 24 % (ref 12–46)
Lymphs Abs: 2.2 10*3/uL (ref 0.7–4.0)
MCH: 30.3 pg (ref 26.0–34.0)
MCHC: 32.5 g/dL (ref 30.0–36.0)
MCV: 93.2 fL (ref 78.0–100.0)
Monocytes Absolute: 0.8 10*3/uL (ref 0.1–1.0)
Monocytes Relative: 9 % (ref 3–12)
Neutro Abs: 6.1 10*3/uL (ref 1.7–7.7)
Neutrophils Relative %: 66 % (ref 43–77)
PLATELETS: 330 10*3/uL (ref 150–400)
RBC: 3.7 MIL/uL — ABNORMAL LOW (ref 3.87–5.11)
RDW: 13.9 % (ref 11.5–15.5)
WBC: 9.2 10*3/uL (ref 4.0–10.5)

## 2013-11-02 MED ORDER — DIPHENHYDRAMINE HCL 12.5 MG/5ML PO ELIX
25.0000 mg | ORAL_SOLUTION | Freq: Once | ORAL | Status: AC
Start: 2013-11-02 — End: 2013-11-02
  Administered 2013-11-02: 25 mg via ORAL
  Filled 2013-11-02: qty 10

## 2013-11-02 NOTE — Progress Notes (Signed)
Nutrition Education Note  Received consult for diet education per DROP protocol.   Discussed 2 week post op diet with pt. Emphasized that liquids must be non carbonated, non caffeinated, and sugar free. Fluid goals discussed. Pt to follow up with outpatient bariatric RD for further diet progression after 2 weeks. Multivitamins and minerals also reviewed. Teach back method used, pt expressed understanding, expect good compliance.   Diet: First 2 Weeks  You will see the nutritionist about two (2) weeks after your surgery. The nutritionist will increase the types of foods you can eat if you are handling liquids well:  If you have severe vomiting or nausea and cannot handle clear liquids lasting longer than 1 day, call your surgeon  Protein Shake  Drink at least 2 ounces of shake 5-6 times per day  Each serving of protein shakes (usually 8 - 12 ounces) should have a minimum of:  15 grams of protein  And no more than 5 grams of carbohydrate  Goal for protein each day:  Men = 80 grams per day  Women = 60 grams per day  Protein powder may be added to fluids such as non-fat milk or Lactaid milk or Soy milk (limit to 35 grams added protein powder per serving)   Hydration  Slowly increase the amount of water and other clear liquids as tolerated (See Acceptable Fluids)  Slowly increase the amount of protein shake as tolerated  Sip fluids slowly and throughout the day  May use sugar substitutes in small amounts (no more than 6 - 8 packets per day; i.e. Splenda)   Fluid Goal  The first goal is to drink at least 8 ounces of protein shake/drink per day (or as directed by the nutritionist); some examples of protein shakes are [employer], [employer], EAS Edge HP, and Unjury. See handout from pre-op Bariatric Education Class:  Slowly increase the amount of protein shake you drink as tolerated  You may find it easier to slowly sip shakes throughout the day  It is important to get your proteins  in first  Your fluid goal is to drink 64 - 100 ounces of fluid daily  It may take a few weeks to build up to this  32 oz (or more) should be clear liquids  And  32 oz (or more) should be full liquids (see below for examples)  Liquids should not contain sugar, caffeine, or carbonation   Clear Liquids:  Water or Sugar-free flavored water (i.e. Fruit H2O, Propel)  Decaffeinated coffee or tea (sugar-free)  Crystal Lite, Wyler's Lite, Minute Maid Lite  Sugar-free Jell-O  Bouillon or broth  Sugar-free Popsicle: *Less than 20 calories each; Limit 1 per day   Full Liquids:  Protein Shakes/Drinks + 2 choices per day of other full liquids  Full liquids must be:  No More Than 12 grams of Carbs per serving  No More Than 3 grams of Fat per serving  Strained low-fat cream soup  Non-Fat milk  Fat-free Lactaid Milk  Sugar-free yogurt (Dannon Lite & Fit, [place] yogurt)     [place] MS, RD, [place] [phone] Pager [phone] Weekend/After Hours Pager

## 2013-11-02 NOTE — Progress Notes (Signed)
Late Entry - Patient alert and oriented, Post op day 1.  Provided support and encouragement.  Encouraged pulmonary toilet, ambulation and small sips of liquids.  All questions answered.  Will continue to monitor.

## 2013-11-02 NOTE — Progress Notes (Addendum)
Patient alert and oriented, pain is controlled. Patient is tolerating fluids,  advanced to protein shake today, patient tolerated well. Reviewed Gastric Bypass discharge instructions with patient and patient is able to articulate understanding. Provided information on BELT program, Support Group and [facility] outpatient pharmacy. All questions answered, will continue to monitor.    

## 2013-11-02 NOTE — Discharge Instructions (Signed)
PER BARIATRIC NURSE     GASTRIC BYPASS/SLEEVE  Home Care Instructions   These instructions are to help you care for yourself when you go home.  Call: If you have any problems.   Call [phone] and ask for the surgeon on call   If you need immediate assistance come to the ER at [facility]. Tell the ER staff you are a new post-op gastric bypass or gastric sleeve patient  Signs and symptoms to report:   Severe  vomiting or nausea o If you cannot handle clear liquids for longer than 1 day, call your surgeon   Abdominal pain which does not get better after taking your pain medication   Fever greater than 100.4  F and chills   Heart rate over 100 beats a minute   Trouble breathing   Chest pain   Redness,  swelling, drainage, or foul odor at incision (surgical) sites   If your incisions open or pull apart   Swelling or pain in calf (lower leg)   Diarrhea (Loose bowel movements that happen often), frequent watery, uncontrolled bowel movements   Constipation, (no bowel movements for 3 days) if this happens: o Take Milk of Magnesia, 2 tablespoons by mouth, 3 times a day for 2 days if needed o Stop taking Milk of Magnesia once you have had a bowel movement o Call your doctor if constipation continues Or o Take Miralax  (instead of Milk of Magnesia) following the label instructions o Stop taking Miralax once you have had a bowel movement o Call your doctor if constipation continues   Anything you think is abnormal for you   Normal side effects after surgery:   Unable to sleep at night or unable to concentrate   Irritability   Being tearful (crying) or depressed  These are common complaints, possibly related to your anesthesia, stress of surgery, and change in lifestyle, that usually go away a few weeks after surgery. If these feelings continue, call your medical doctor.  Wound Care: You may have surgical glue, steri-strips, or staples over your incisions after surgery   Surgical  glue: Looks like clear film over your incisions and will wear off a little at a time   Steri-strips: Adhesive strips of tape over your incisions. You may notice a yellowish color on skin under the steri-strips. This is used to make the steri-strips stick better. Do not pull the steri-strips off - let them fall off   Staples: Staples may be removed before you leave the hospital o If you go home with staples, call [facility] for an appointment with your surgeons nurse to have staples removed 10 days after surgery, [phone]   Showering: You may shower two (2) days after your surgery unless your surgeon tells you differently o Wash gently around incisions with warm soapy water, rinse well, and gently pat dry o If you have a drain (tube from your incision), you may need someone to hold this while you shower o No tub baths until staples are removed and incisions are healed   Medications:   Medications should be liquid or crushed if larger than the size of a dime   Extended release pills (medication that releases a little bit at a time through the  day) should not be crushed   Depending on the size and number of medications you take, you may need to space (take a few throughout the day)/change the time you take your medications so that you do not  life -style changes °• If you have diabetes, follow up with your doctor that orders your diabetes medication(s) within one week after surgery and check your blood sugar regularly ° °• Do not drive while taking narcotics (pain medications) ° °• Do not take acetaminophen (Tylenol) and Roxicet or Lortab Elixir at the same time since these pain medications contain acetaminophen °  °Diet:  °First 2 Weeks You will see the nutritionist about two (2) weeks after your surgery. The nutritionist will  increase the types of foods you can eat if you are handling liquids well: °• If you have severe vomiting or nausea and cannot handle clear liquids lasting longer than 1 day call your surgeon °Protein Shake °• Drink at least 2 ounces of shake 5-6 times per day °• Each serving of protein shakes (usually 8-12 ounces) should have a minimum of: °o 15 grams of protein °o And no more than 5 grams of carbohydrate °• Goal for protein each day: °o Men = 80 grams per day °o Women = 60 grams per day °  ° • Protein powder may be added to fluids such as non-fat milk or Lactaid milk or Soy milk (limit to 35 grams added protein powder per serving) ° °Hydration °• Slowly increase the amount of water and other clear liquids as tolerated (See Acceptable Fluids) °• Slowly increase the amount of protein shake as tolerated °• Sip fluids slowly and throughout the day °• May use sugar substitutes in small amounts (no more than 6-8 packets per day; i.e. Splenda) ° °Fluid Goal °• The first goal is to drink at least 8 ounces of protein shake/drink per day (or as directed by the nutritionist); some examples of protein shakes are Syntrax Nectar, Adkins Advantage, EAS Edge HP, and Unjury. - See handout from pre-op Bariatric Education Class: °o Slowly increase the amount of protein shake you drink as tolerated °o You may find it easier to slowly sip shakes throughout the day °o It is important to get your proteins in first °• Your fluid goal is to drink 64-100 ounces of fluid daily °o It may take a few weeks to build up to this  °• 32 oz. (or more) should be clear liquids °And °• 32 oz. (or more) should be full liquids (see below for examples) °• Liquids should not contain sugar, caffeine, or carbonation ° °Clear Liquids: °• Water of Sugar-free flavored water (i.e. Fruit H²O, Propel) °• Decaffeinated coffee or tea (sugar-free) °• Crystal lite, Wyler’s Lite, Minute Maid Lite °• Sugar-free Jell-O °• Bouillon or broth °• Sugar-free Popsicle:    -  Less than 20 calories each; Limit 1 per day ° °Full Liquids: °                  Protein Shakes/Drinks + 2 choices per day of other full liquids °• Full liquids must be: °o No More Than 12 grams of Carbs per serving °o No More Than 3 grams of Fat per serving °• Strained low-fat cream soup °• Non-Fat milk °• Fat-free Lactaid Milk °• Sugar-free yogurt (Dannon Lite & Fit, Greek yogurt) ° °  °Vitamins and Minerals • Start 1 day after surgery unless otherwise directed by your surgeon °• 2 Chewable Multivitamin / Multimineral Supplement with iron (i.e. Centrum for Adults) °• Vitamin B-12, 350-500 micrograms sub-lingual (place tablet under the tongue) each day °• Chewable Calcium Citrate with Vitamin D-3 °(Example: 3 Chewable Calcium  Plus 600 with Vitamin D-3) °o Take 500 mg three (3)   times a day for a total of 1500 mg each day °o Do not take all 3 doses of calcium at one time as it may cause constipation, and you can only absorb 500 mg at a time °o Do not mix multivitamins containing iron with calcium supplements;  take 2 hours apart °o Do not substitute Tums (calcium carbonate) for your calcium °• Menstruating women and those at risk for anemia ( a blood disease that causes weakness) may need extra iron °o Talk to your doctor to see if you need more iron °• If you need extra iron: Total daily Iron recommendation (including Vitamins) is 50 to 100 mg Iron/day °• Do not stop taking or change any vitamins or minerals until you talk to your nutritionist or surgeon °• Your nutritionist and/or surgeon must approve all vitamin and mineral supplements °  °Activity and Exercise: It is important to continue walking at home. Limit your physical activity as instructed by your doctor. During this time, use these guidelines: °• Do not lift anything greater than ten  (10) pounds for at least two (2) weeks °• Do not go back to work or drive until your surgeon says you can °• You may have sex when you feel comfortable °o It is VERY  important for female patients to use a reliable birth control method; fertility often increase after surgery °o Do not get pregnant for at least 18 months °• Start exercising as soon as your doctor tells you that you can °o Make sure your doctor approves any physical activity °• Start with a simple walking program °• Walk 5-15 minutes each day, 7 days per week °• Slowly increase until you are walking 30-45 minutes per day °• Consider joining our BELT program. [phone] or email [email or web address] °  °Special Instructions Things to remember: °• Free counseling is available for you and your family through collaboration between [facility] and [employer]. Please call [phone] and leave a message °• Use your CPAP when sleeping if this applies to you °• Consider buying a medical alert bracelet that says you had lap-band surgery °  °  You will likely have your first fill (fluid added to your band) 6 - 8 weeks after surgery °• [facility] has a free Bariatric Surgery Support Group that meets monthly, the 3rd Thursday, 6pm. [facility] Classrooms. You can see classes online at [email or web address] °• It is very important to keep all follow up appointments with your surgeon, nutritionist, primary care physician, and behavioral health practitioner °o After the first year, please follow up with your bariatric surgeon and nutritionist at least once a year in order to maintain best weight loss results °      °             [facility]:  [phone] ° °             [facility]: [phone] ° °             Bariatric Nurse Coordinator: [phone]  °Gastric Bypass/Sleeve Home Care Instructions  Rev. 05/2012    ° °                                                    Reviewed and Endorsed °                                                     by [facility] Patient Education Committee, Jan, 2014 ° ° ° ° ° ° ° ° ° °

## 2013-11-02 NOTE — Progress Notes (Signed)
Patient ID: Katelyn [name], female   DOB: 02-07-1976, 38 y.o.   MRN: [id number] 2 Days Post-Op  Subjective: Some LUQ pain, better than yesterday and managed with meds.  Some nausea with oral meds, no vomiting, tolerating water po well.  Anxious about going home  Objective: Vital signs in last 24 hours: Temp:  [97.7 F (36.5 C)-98.7 F (37.1 C)] 97.7 F (36.5 C) (07/08 0625) Pulse Rate:  [74-92] 84 (07/08 0625) Resp:  [16] 16 (07/08 0625) BP: (104-114)/(70-76) 108/75 mmHg (07/08 0625) SpO2:  [89 %-97 %] 97 % (07/08 0625) Last BM Date: 10/31/13  Intake/Output from previous day: 07/07 0701 - 07/08 0700 In: 3652.5 [P.O.:180; I.V.:3472.5] Out: 1550 [Urine:1550] Intake/Output this shift:    General appearance: alert, cooperative and no distress GI: normal findings: soft, non-tender Incision/Wound: Clean and dry  Lab Results:   Recent Labs  11/01/13 0503 11/01/13 1545 11/02/13 0453  WBC 12.4*  --  9.2  HGB 11.4* 11.7* 11.2*  HCT 35.7* 36.2 34.5*  PLT 363  --  330   BMET No results found for this basename: NA, K, CL, CO2, GLUCOSE, BUN, CREATININE, CALCIUM,  in the last 72 hours   Studies/Results: Dg Ugi W/water Sol Cm  11/01/2013   CLINICAL DATA:  Post Roux-en-Y gastric bypass  EXAM: WATER SOLUBLE UPPER GI SERIES  TECHNIQUE: Single-column upper GI series was performed using water soluble contrast.  CONTRAST:  20 ml Omnipaque  COMPARISON:  Upper GI 07/13/2009  FLUOROSCOPY TIME:  1 min 5 seconds  FINDINGS: Water soluble contrast flows readily through the gastroesophageal junction. Small gastric remnant is identified. Contrast flows readily through the gastrojejunostomy without evidence of leak or obstruction. No significant residual contrast in the esophagus.  IMPRESSION: No evidence of leak or obstruction following Roux-en-Y gastric bypass surgery.   Electronically Signed   By: Suzy Bouchard M.D.   On: 11/01/2013 09:38    Anti-infectives: Anti-infectives   Start      Dose/Rate Route Frequency Ordered Stop   10/31/13 0509  levofloxacin (LEVAQUIN) IVPB 750 mg     750 mg 100 mL/hr over 90 Minutes Intravenous On call to O.R. 10/31/13 0509 10/31/13 9357      Assessment/Plan: s/p Procedure(s): LAPAROSCOPIC ROUX-EN-Y GASTRIC BYPASS WITH UPPER ENDOSCOPY Doing well without apparent complication. Start protein shakes  Observe for now, likely home tomorrow or possibly later today if she feels more comfortable   LOS: 2 days    Sumaiya Arruda T 11/02/2013

## 2013-11-02 NOTE — Progress Notes (Signed)
Patient complaining of itching sensation after taking oxycodone for pain, order received from MD [facility] for benadryl 70ml once Neta Mends RN 11-02-2013 13:58pm

## 2013-11-04 NOTE — Discharge Summary (Signed)
  Patient ID: Katelyn [name] [id number] 38 y.o. 1976-01-15  10/31/2013  Discharge date and time: 11/03/2013   Admitting Physician: Excell Seltzer T  Discharge Physician: Excell Seltzer T  Admission Diagnoses: morbid obesity   Discharge Diagnoses: same  Operations: Procedure(s): LAPAROSCOPIC ROUX-EN-Y GASTRIC BYPASS WITH UPPER ENDOSCOPY  Admission Condition: good  Discharged Condition: good  Indication for Admission: patient is a 38 year old female with a long history of progressive morbid obesity unresponsive to medical management. After extensive preoperative workup and discussion detailed elsewhere she is electively admitted for laparoscopic Roux-en-Y gastric bypass for treatment of her morbid obesity.  Hospital Course: on the morning of admission the patient underwent an uneventful laparoscopic Roux-en-Y gastric bypass. Her postoperative course was unremarkable. Gastrografin swallow on postop day #1 showed no leak or obstruction and she was started on water. She was stable with a mildly elevated white count. By postoperative day 2 white count was normal. Vital signs are stable. Abdomen is soft and nontender. She is tolerating her protein shakes without difficulty and felt ready for discharge.   Disposition: Home  Patient Instructions:    Medication List    STOP taking these medications       ciprofloxacin 500 MG tablet  Commonly known as:  CIPRO      TAKE these medications       buPROPion 150 MG 24 hr tablet  Commonly known as:  WELLBUTRIN XL  Take 150 mg by mouth every morning.     FLUoxetine 40 MG capsule  Commonly known as:  PROZAC  Take 40 mg by mouth every morning.     GLUCOPHAGE 500 MG tablet  Generic drug:  metFORMIN  Take 1 tablet (500 mg total) by mouth 2 (two) times daily with a meal.     magnesium oxide 400 MG tablet  Commonly known as:  MAG-OX  Take 400 mg by mouth daily.     multivitamin with minerals Tabs tablet  Take 1 tablet by mouth  every morning.     oxyCODONE 5 MG/5ML solution  Commonly known as:  ROXICODONE  Take 5-10 mLs (5-10 mg total) by mouth every 4 (four) hours as needed.     temazepam 30 MG capsule  Commonly known as:  RESTORIL  Take 30 mg by mouth at bedtime as needed for sleep.     topiramate 50 MG tablet  Commonly known as:  TOPAMAX  Take 50 mg by mouth 2 (two) times daily.     ziprasidone 80 MG capsule  Commonly known as:  GEODON  Take 80 mg by mouth 2 (two) times daily with a meal.        Activity: activity as tolerated Diet: bariatric protein shakes Wound Care: none needed  Follow-up:  With Dr. Excell Seltzer in 3 weeks.  Signed: Edward Jolly MD, FACS  11/04/2013, 5:36 PM

## 2013-11-07 ENCOUNTER — Telehealth (HOSPITAL_COMMUNITY): Payer: Self-pay

## 2013-11-07 NOTE — Telephone Encounter (Signed)
Made discharge phone call to patient per DROP protocol. Asking the following questions.    1. Do you have someone to care for you now that you are home?  yes 2. Are you having pain now that is not relieved by your pain medication?  no 3. Are you able to drink the recommended daily amount of fluids (48 ounces minimum/day) and protein (60-80 grams/day) as prescribed by the dietitian or nutritional counselor?  Not yet on fluids, protein 30mg  daily 4. Are you taking the vitamins and minerals as prescribed?  yes 5. Do you have the "on call" number to contact your surgeon if you have a problem or question?  yes 6. Are your incisions free of redness, swelling or drainage? (If steri strips, address that these can fall off, shower as tolerated) no 7. Have your bowels moved since your surgery?  If not, are you passing gas?  Yes! 8. Are you up and walking 3-4 times per day?  yes 9. Do you have an appointment to see a dietitian or nutritional counselor in the next month?  yes  The following questions can be added at the center's discharge phone call script at the center's discretion.  The questions are also captured within [employer] project custom fields. 1. Do you have an appointment made to see your surgeon in the next month?   yes 2. Were you provided your discharge medications before your surgery or before you were discharged from the hospital and are you taking them without problem?  yes 3. Were you provided phone numbers to the clinic/surgeon's office?  yes 4. Did you watch the patient education video module in the (clinic, surgeon's office, etc.) before your surgery? yes 5. Do you have a discharge checklist that was provided to you in the hospital to reference with instructions on how to take care of yourself after surgery?  yes 6. Did you see a dietitian or nutritional counselor while you were in the hospital?  Yes  Questions for me, how long should it take me to finish a shake? Able to get in about 4  oz at a sitting with no pain -- educated that she could increase volume as tolerated as long as no pain or nausea.

## 2013-11-15 ENCOUNTER — Encounter: Payer: Managed Care, Other (non HMO) | Attending: General Surgery

## 2013-11-15 DIAGNOSIS — Z01818 Encounter for other preprocedural examination: Secondary | ICD-10-CM | POA: Insufficient documentation

## 2013-11-15 DIAGNOSIS — Z713 Dietary counseling and surveillance: Secondary | ICD-10-CM | POA: Insufficient documentation

## 2013-11-15 DIAGNOSIS — Z6841 Body Mass Index (BMI) 40.0 and over, adult: Secondary | ICD-10-CM | POA: Insufficient documentation

## 2013-11-15 NOTE — Progress Notes (Addendum)
Bariatric Class:  Appt start time: 1530 end time:  1630.  2 Week Post-Operative Nutrition Class  Patient was seen on 11/15/2013 for Post-Operative [facility]   Surgery date: 10/31/2013 Surgery type: RYGB [facility] weight at [facility]: 265.5 lbs on 08/09/2013 Weight today: 242.0 lbs  Weight loss: 22.5 lbs  TANITA  BODY COMP RESULTS  10/17/2013 11/15/13   BMI (kg/m^2) 44 40.3   Fat Mass (lbs) 140 124.0   Fat Free Mass (lbs) 124.5 118.0   Total Body Water (lbs) 91 86.5    The following the learning objectives were met by the patient during this course:  Identifies Phase 3A (Soft, High Proteins) Dietary Goals and will begin from 2 weeks post-operatively to 2 months post-operatively  Identifies appropriate sources of fluids and proteins   States protein recommendations and appropriate sources post-operatively  Identifies the need for appropriate texture modifications, mastication, and bite sizes when consuming solids  Identifies appropriate multivitamin and calcium sources post-operatively  Describes the need for physical activity post-operatively and will follow MD recommendations  States when to call healthcare provider regarding medication questions or post-operative complications  Handouts given during class include:  Phase 3A: Soft, High Protein Diet Handout  Follow-Up Plan: Patient will follow-up at [facility] in 6 weeks for 2 month post-op nutrition visit for diet advancement per MD.

## 2013-11-15 NOTE — Patient Instructions (Signed)
Patient to follow Phase 3A-Soft, High Protein Diet and follow-up at [facility] in 6 weeks for 2 months post-op nutrition visit for diet advancement. 

## 2013-11-17 ENCOUNTER — Telehealth (HOSPITAL_COMMUNITY): Payer: Self-pay | Admitting: *Deleted

## 2013-11-17 DIAGNOSIS — F329 Major depressive disorder, single episode, unspecified: Secondary | ICD-10-CM

## 2013-11-17 MED ORDER — FLUOXETINE HCL 20 MG/5ML PO SOLN: 40.0000 mg | Freq: Every day | ORAL | Status: DC

## 2013-11-17 MED ORDER — BUPROPION HCL 75 MG PO TABS: 75.0000 mg | ORAL_TABLET | Freq: Two times a day (BID) | ORAL | Status: DC

## 2013-11-17 NOTE — Telephone Encounter (Signed)
Patient left [id number] again to get medicaitons changed so she can continue them. Please send new prescriptions to [facility]

## 2013-11-17 NOTE — Telephone Encounter (Signed)
Patient left message: Just had Gastric Bypass surgery. Cannot take Wellbutrin and Prozac in current form as she cannot swallow them. Requests that Wellbutrin XL be changed so that she can crush it:  REGUlAR instead of EXTENDED release. Would like Prozac ordered in liquid form.

## 2013-11-17 NOTE — Addendum Note (Signed)
Addended by: Rolland Bimler on: 11/17/2013 04:32 PM   Modules accepted: Orders

## 2013-11-17 NOTE — Telephone Encounter (Addendum)
Dr. Adele Schilder ordered that pharmacy be contacted to find out medication changes needed. Following medication changes made/ordered with pharmacist: Wellbutrin 75 mg, BID-- May crush for administration, #60 Prozac 40 mg/10 ml  Daily, 300 ml

## 2013-11-21 ENCOUNTER — Encounter (HOSPITAL_COMMUNITY): Payer: Self-pay | Admitting: Professional Counselor

## 2013-11-21 ENCOUNTER — Ambulatory Visit (INDEPENDENT_AMBULATORY_CARE_PROVIDER_SITE_OTHER): Payer: Managed Care, Other (non HMO) | Admitting: Professional Counselor

## 2013-11-21 DIAGNOSIS — F411 Generalized anxiety disorder: Secondary | ICD-10-CM | POA: Diagnosis not present

## 2013-11-21 NOTE — Progress Notes (Signed)
   THERAPIST PROGRESS NOTE  Session Time: 11:00am-11:45am  Participation Level: Active  Behavioral Response: CasualAlertDepressed and Hopeless  Type of Therapy: Individual Therapy  Treatment Goals addressed: Diagnosis: Depression/Anxiety  Interventions: Motivational Interviewing and Solution Focused  Summary: Katelyn [name] is a 38 y.o. female who presents with depressed mood and congruent affect.  Marquesa shares she just had gastric bypass surgery the beginning of the month. Patient was able to process feelings before surgery and feelings of aftermath being very tearful, depressed, and anxious. Patient reports she is struggling taking her pills, leaving the house, and accepting this new life style. LCSW and patient processed change and transition and validated emotions of feeling overwhelmed and accepting her new body and lifestyle. LCSW reviewed coping techniques such as pts relaxation room in her home, exercise, aromatherapy, and breathing in efforts to feel more in control and less overwhelmed. Patient also discussed feelings of isolation around others when they eat and LCSW educated patient on stages of change and how patient is currently in pre contemplation stage of accepting this new identity. Lastly, session focused on future oriented planning and ideals for patient using solution focused therapy.     Suicidal/Homicidal: Nowithout intent/plan  Therapist Response: LCSW assessed patient's overall anxiety and depression, coping skills patient is utilizing and daily function level.  Patient and LCSW reviewed alternative ways to coping without food such as support groups, aromatherapy, mindfulness and using her relax room in her home. Patient was open to reviewing past coping skills that were helpful and will follow up regarding next appointment regarding if they were helpful.  Plan: Return again in 2-3 weeks.  Diagnosis: Axis I: Generalized Anxiety Disorder    Axis II:  Deferred    Lilly Cove, LCSW 11/21/2013

## 2013-11-22 ENCOUNTER — Other Ambulatory Visit (HOSPITAL_COMMUNITY): Payer: Self-pay | Admitting: Psychiatry

## 2013-11-23 ENCOUNTER — Ambulatory Visit (INDEPENDENT_AMBULATORY_CARE_PROVIDER_SITE_OTHER): Payer: Managed Care, Other (non HMO) | Admitting: General Surgery

## 2013-11-23 ENCOUNTER — Encounter (INDEPENDENT_AMBULATORY_CARE_PROVIDER_SITE_OTHER): Payer: Self-pay | Admitting: General Surgery

## 2013-11-23 NOTE — Progress Notes (Signed)
Chief complaint: Followup gastric bypass  History: Patient returns 3 weeks status post laparoscopic Roux-en-Y gastric bypass for morbid obesity and comorbidities of prediabetes, stress urinary incontinence, joint pain and probable mild sleep apnea and polycystic ovarian syndrome. She has not had any major problems. She is on a number of medications for depression and sleep disorder. She has had trouble sleeping and feels that she is nauseated and generally not feeling well when she is unable to sleep. This has been better the last couple of nights. She is concerned that some of her medications are not being absorbed well. She is tolerating protein shakes and starting some small protein meals without major problems.  Exam: BP 126/80  Pulse 78  Ht 5\' 5"  (1.651 m)  Wt 236 lb (107.049 kg)  BMI 39.27 kg/m2 Total weight loss 24 pounds General: Does not appear ill Skin: Normal turgor without a rash Abdomen: Soft and nontender. Incision is healing well  Assessment and plan: I think she is doing well following gastric bypass. We discussed that some of her sleep issues and anxiety may just be perioperative disturbance. I'm not aware of poor absorption of psychiatric meds but I urged her to ask her psychiatrist if he had any knowledge of this. Overall however I think she is doing well these issues appear to be settling out. Return in 3 weeks

## 2013-12-07 ENCOUNTER — Ambulatory Visit (HOSPITAL_COMMUNITY): Payer: Self-pay | Admitting: Professional Counselor

## 2013-12-12 ENCOUNTER — Ambulatory Visit (HOSPITAL_COMMUNITY): Payer: Self-pay | Admitting: Psychiatry

## 2013-12-14 ENCOUNTER — Encounter (HOSPITAL_COMMUNITY): Payer: Self-pay | Admitting: Psychiatry

## 2013-12-14 ENCOUNTER — Ambulatory Visit (INDEPENDENT_AMBULATORY_CARE_PROVIDER_SITE_OTHER): Payer: Managed Care, Other (non HMO) | Admitting: Psychiatry

## 2013-12-14 ENCOUNTER — Ambulatory Visit (HOSPITAL_COMMUNITY): Payer: Self-pay | Admitting: Professional Counselor

## 2013-12-14 VITALS — BP 123/73 | HR 101 | Wt 225.0 lb

## 2013-12-14 DIAGNOSIS — F333 Major depressive disorder, recurrent, severe with psychotic symptoms: Secondary | ICD-10-CM | POA: Diagnosis not present

## 2013-12-14 DIAGNOSIS — F331 Major depressive disorder, recurrent, moderate: Secondary | ICD-10-CM

## 2013-12-14 MED ORDER — BUPROPION HCL ER (XL) 300 MG PO TB24: 300.0000 mg | ORAL_TABLET | ORAL | Status: DC

## 2013-12-14 MED ORDER — TEMAZEPAM 30 MG PO CAPS: 30.0000 mg | ORAL_CAPSULE | Freq: Every evening | ORAL | Status: DC | PRN

## 2013-12-14 MED ORDER — FLUOXETINE HCL 40 MG PO CAPS: 40.0000 mg | ORAL_CAPSULE | Freq: Every day | ORAL | Status: DC
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
## 2013-12-15 ENCOUNTER — Other Ambulatory Visit (INDEPENDENT_AMBULATORY_CARE_PROVIDER_SITE_OTHER): Payer: Self-pay | Admitting: General Surgery

## 2013-12-15 ENCOUNTER — Ambulatory Visit (INDEPENDENT_AMBULATORY_CARE_PROVIDER_SITE_OTHER): Payer: Managed Care, Other (non HMO) | Admitting: General Surgery

## 2013-12-15 ENCOUNTER — Encounter (INDEPENDENT_AMBULATORY_CARE_PROVIDER_SITE_OTHER): Payer: Self-pay | Admitting: General Surgery

## 2013-12-15 DIAGNOSIS — K909 Intestinal malabsorption, unspecified: Secondary | ICD-10-CM

## 2013-12-15 LAB — BASIC METABOLIC PANEL
BUN: 9 mg/dL (ref 6–23)
CHLORIDE: 101 meq/L (ref 96–112)
CO2: 18 meq/L — AB (ref 19–32)
CREATININE: 0.96 mg/dL (ref 0.50–1.10)
Calcium: 8.9 mg/dL (ref 8.4–10.5)
Glucose, Bld: 73 mg/dL (ref 70–99)
POTASSIUM: 3.6 meq/L (ref 3.5–5.3)
Sodium: 139 mEq/L (ref 135–145)

## 2013-12-15 LAB — CBC
HEMATOCRIT: 41.9 % (ref 36.0–46.0)
Hemoglobin: 14.2 g/dL (ref 12.0–15.0)
MCH: 30.6 pg (ref 26.0–34.0)
MCHC: 33.9 g/dL (ref 30.0–36.0)
MCV: 90.3 fL (ref 78.0–100.0)
PLATELETS: 350 10*3/uL (ref 150–400)
RBC: 4.64 MIL/uL (ref 3.87–5.11)
RDW: 16.6 % — AB (ref 11.5–15.5)
WBC: 10 10*3/uL (ref 4.0–10.5)

## 2013-12-15 NOTE — Progress Notes (Signed)
Chief complaint: Followup gastric bypass  History: Patient returns for her 6 week postoperative visit status post laparoscopic Roux-en-Y gastric bypass. She is having some difficulty progressing toward solid foods. She finds very little that she is able to E. Without causing nausea and occasional vomiting. She is able to tolerate yogurt and soup or chilli consistency foods.  She has been having some loose stools 4-5 times per day. No dominant or pain. No fever or chills. She is using protein powder supplement to get her protein and.  Exam: BP 130/70  Pulse 77  Ht 5\' 5"  (1.651 m)  Wt 224 lb (101.606 kg)  BMI 37.28 kg/m2 Total weight loss 36 pounds, 12 pounds from last visit General: Appears well Skin: Normal turgor no rashes infection Abdomen: Soft and nontender with well-healed  Assessment and plan: Some persistent nausea and food intolerance but getting along OK and I really do not see any evidence of complication. We discussed diet options and she'll be seen a dietitian again soon. I will check electrolytes and blood count. She will call for any worsening symptoms I reassured her that it is not unusual food intolerance early on after surgery and this should steadily improve.

## 2013-12-16 ENCOUNTER — Ambulatory Visit (INDEPENDENT_AMBULATORY_CARE_PROVIDER_SITE_OTHER): Payer: Managed Care, Other (non HMO) | Admitting: Psychiatry

## 2013-12-16 DIAGNOSIS — F331 Major depressive disorder, recurrent, moderate: Secondary | ICD-10-CM

## 2013-12-16 DIAGNOSIS — F411 Generalized anxiety disorder: Secondary | ICD-10-CM
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
## 2013-12-20 ENCOUNTER — Telehealth (HOSPITAL_COMMUNITY): Payer: Self-pay | Admitting: *Deleted

## 2013-12-20 NOTE — Telephone Encounter (Signed)
[facility] sent fax and called office to verify that patient should have Bupropion XL 300 mg as ordered - due to status of post bariatric surgery pt may not absorb medication as well. Per MD, contact pt to see if med is tolerated. If tolerated, he will authorize, but asked that pt be cautioned to report any changes in mood as may indicate medication not being fully absorbed.  Contacted patient, she states she can take it without problem.  Cautioned to report any changes in mood as may indicate medication not being fully absorbed. Patient verbalized understanding. Contacted [facility]: Gave MD authorization to Sherri Rad, pharmacist continue Bupropion XL for patient.

## 2013-12-21 ENCOUNTER — Ambulatory Visit (HOSPITAL_COMMUNITY): Payer: Self-pay | Admitting: Professional Counselor

## 2013-12-27 ENCOUNTER — Encounter: Payer: Managed Care, Other (non HMO) | Attending: General Surgery | Admitting: Dietician

## 2013-12-27 DIAGNOSIS — Z713 Dietary counseling and surveillance: Secondary | ICD-10-CM | POA: Diagnosis not present

## 2013-12-27 DIAGNOSIS — Z6841 Body Mass Index (BMI) 40.0 and over, adult: Secondary | ICD-10-CM | POA: Insufficient documentation

## 2013-12-27 DIAGNOSIS — Z01818 Encounter for other preprocedural examination: Secondary | ICD-10-CM | POA: Diagnosis not present

## 2013-12-27 DIAGNOSIS — E669 Obesity, unspecified: Secondary | ICD-10-CM

## 2013-12-27 NOTE — Patient Instructions (Addendum)
Goals:  Follow Phase 3B: High Protein + Non-Starchy Vegetables  Eat 3-6 small meals/snacks, every 3-5 hrs  Increase lean protein foods to meet 60g goal  Increase fluid intake to 64oz +  Sip more throughout the middle of the day  Avoid drinking 15 minutes before, during and 30 minutes after eating  Aim for >30 min of physical activity daily  Add another protein snack: deli meat, cheese, beans, [place] sausage, Dannon Light and Fit Greek yogurt, egg   Surgery date: 10/31/2013 Surgery type: RYGB Start weight at [facility]: 265.5 lbs on 08/09/2013 Weight today: 218 lbs Weight loss: 24 lbs Total weight loss: 47.5 lbs   TANITA  BODY COMP RESULTS  10/17/2013 11/15/13 12/27/13   BMI (kg/m^2) 44 40.3 35.7   Fat Mass (lbs) 140 124.0 101.5   Fat Free Mass (lbs) 124.5 118.0 116.5   Total Body Water (lbs) 91 86.5 85.5

## 2013-12-27 NOTE — Progress Notes (Signed)
  Follow-up visit:  8 Weeks Post-Operative RYGB Surgery  Medical Nutrition Therapy:  Appt start time: 0945 end time:  1015.  Primary concerns today: Post-operative Bariatric Surgery Nutrition Management. Katelyn [name] returns today having lost 24 pounds since last visit. She is meeting her protein needs but relying heavily on protein powder. She states that she can only eat 1/2 egg or 1/2 oz of meat at one time. Katelyn [name] reports that she feels thirsty all the time and would rather drink than eat.   Surgery date: 10/31/2013 Surgery type: RYGB Start weight at [facility]: 265.5 lbs on 08/09/2013 Weight today: 218 lbs Weight loss: 24 lbs Total weight loss: 47.5 lbs  TANITA  BODY COMP RESULTS  10/17/2013 11/15/13 12/27/13   BMI (kg/m^2) 44 40.3 35.7   Fat Mass (lbs) 140 124.0 101.5   Fat Free Mass (lbs) 124.5 118.0 116.5   Total Body Water (lbs) 91 86.5 85.5    Preferred Learning Style:   No preference indicated   Learning Readiness:   Ready  24-hr recall: B (8-10 AM): 8oz GenePro shake (30g) Snk (AM):   L (12 PM): 0.5 oz ground [place], 1/8 cup squash (4g) Snk (3 PM): string cheese (7g)  D (6 PM): 0.5 oz ground [place] and 1/8 cup squash (4g) Snk (8-9 PM): protein shake (15g)  Fluid intake: 16-24 oz water, sips all day (12 oz protein shake) Estimated total protein intake: 60g  Medications: see list Supplementation: taking  Using straws: no Drinking while eating: no Hair loss: yes, in the last 2-3 weeks (taking Biotin) Carbonated beverages: no N/V/D/C: n/v if tries to eat too much Dumping syndrome: no  Recent physical activity:  30-90 minutes of biking/treadmill at least 5 days a week   Progress Towards Goal(s):  In progress.  Handouts given during visit include:  Phase 3B lean protein + non-starchy vegetables   Nutritional Diagnosis:  [place]-3.3 Overweight/obesity related to past poor dietary habits and physical inactivity as evidenced by patient w/ recent RYGB surgery following dietary  guidelines for continued weight loss.     Intervention:  Nutrition counseling provided.  Teaching Method Utilized:  Visual Auditory  Barriers to learning/adherence to lifestyle change: none  Demonstrated degree of understanding via:  Teach Back   Monitoring/Evaluation:  Dietary intake, exercise, and body weight. Follow up in 1 months for 3 month post-op visit.

## 2013-12-29 ENCOUNTER — Ambulatory Visit (INDEPENDENT_AMBULATORY_CARE_PROVIDER_SITE_OTHER): Payer: Managed Care, Other (non HMO) | Admitting: Psychiatry

## 2013-12-29 DIAGNOSIS — F411 Generalized anxiety disorder: Secondary | ICD-10-CM

## 2013-12-29 DIAGNOSIS — F331 Major depressive disorder, recurrent, moderate: Secondary | ICD-10-CM
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
## 2014-01-04 ENCOUNTER — Ambulatory Visit (HOSPITAL_COMMUNITY): Payer: Self-pay | Admitting: Professional Counselor

## 2014-01-17 ENCOUNTER — Ambulatory Visit (INDEPENDENT_AMBULATORY_CARE_PROVIDER_SITE_OTHER): Payer: Managed Care, Other (non HMO) | Admitting: Psychiatry

## 2014-01-17 ENCOUNTER — Encounter (HOSPITAL_COMMUNITY): Payer: Self-pay | Admitting: Psychiatry

## 2014-01-17 VITALS — BP 104/68 | HR 98 | Ht 65.0 in | Wt 212.6 lb

## 2014-01-17 DIAGNOSIS — F323 Major depressive disorder, single episode, severe with psychotic features: Secondary | ICD-10-CM

## 2014-01-17 DIAGNOSIS — F331 Major depressive disorder, recurrent, moderate: Secondary | ICD-10-CM

## 2014-01-17 MED ORDER — BUPROPION HCL 75 MG PO TABS: ORAL_TABLET | ORAL | Status: DC
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
## 2014-01-18 ENCOUNTER — Ambulatory Visit (HOSPITAL_COMMUNITY): Payer: Self-pay | Admitting: Professional Counselor

## 2014-02-02 ENCOUNTER — Ambulatory Visit (INDEPENDENT_AMBULATORY_CARE_PROVIDER_SITE_OTHER): Payer: Managed Care, Other (non HMO) | Admitting: General Surgery

## 2014-02-02 ENCOUNTER — Other Ambulatory Visit (INDEPENDENT_AMBULATORY_CARE_PROVIDER_SITE_OTHER): Payer: Self-pay | Admitting: General Surgery

## 2014-02-02 ENCOUNTER — Other Ambulatory Visit (INDEPENDENT_AMBULATORY_CARE_PROVIDER_SITE_OTHER): Payer: Self-pay

## 2014-02-02 DIAGNOSIS — Z9884 Bariatric surgery status: Secondary | ICD-10-CM

## 2014-02-02 LAB — FOLATE: Folate: 18.7 ng/mL

## 2014-02-02 LAB — CBC WITH DIFFERENTIAL/PLATELET
BASOS ABS: 0 10*3/uL (ref 0.0–0.1)
Basophils Relative: 0 % (ref 0–1)
Eosinophils Absolute: 0.1 10*3/uL (ref 0.0–0.7)
Eosinophils Relative: 1 % (ref 0–5)
HCT: 40.6 % (ref 36.0–46.0)
Hemoglobin: 13.7 g/dL (ref 12.0–15.0)
LYMPHS PCT: 25 % (ref 12–46)
Lymphs Abs: 1.8 10*3/uL (ref 0.7–4.0)
MCH: 31.9 pg (ref 26.0–34.0)
MCHC: 33.7 g/dL (ref 30.0–36.0)
MCV: 94.6 fL (ref 78.0–100.0)
MONO ABS: 0.5 10*3/uL (ref 0.1–1.0)
Monocytes Relative: 7 % (ref 3–12)
NEUTROS ABS: 4.8 10*3/uL (ref 1.7–7.7)
Neutrophils Relative %: 67 % (ref 43–77)
PLATELETS: 300 10*3/uL (ref 150–400)
RBC: 4.29 MIL/uL (ref 3.87–5.11)
RDW: 18.9 % — AB (ref 11.5–15.5)
WBC: 7.2 10*3/uL (ref 4.0–10.5)

## 2014-02-02 LAB — COMPREHENSIVE METABOLIC PANEL
ALBUMIN: 3.9 g/dL (ref 3.5–5.2)
ALK PHOS: 68 U/L (ref 39–117)
ALT: 24 U/L (ref 0–35)
AST: 19 U/L (ref 0–37)
BUN: 6 mg/dL (ref 6–23)
CO2: 21 mEq/L (ref 19–32)
CREATININE: 0.84 mg/dL (ref 0.50–1.10)
Calcium: 9 mg/dL (ref 8.4–10.5)
Chloride: 103 mEq/L (ref 96–112)
Glucose, Bld: 69 mg/dL — ABNORMAL LOW (ref 70–99)
POTASSIUM: 3.7 meq/L (ref 3.5–5.3)
Sodium: 141 mEq/L (ref 135–145)
Total Bilirubin: 0.4 mg/dL (ref 0.2–1.2)
Total Protein: 6.3 g/dL (ref 6.0–8.3)

## 2014-02-02 LAB — LIPID PANEL
CHOL/HDL RATIO: 3.2 ratio
Cholesterol: 124 mg/dL (ref 0–200)
HDL: 39 mg/dL — AB (ref >39)
LDL CALC: 66 mg/dL (ref 0–99)
Triglycerides: 96 mg/dL (ref <150)
VLDL: 19 mg/dL (ref 0–40)

## 2014-02-02 LAB — HEMOGLOBIN A1C
Hgb A1c MFr Bld: 4.5 % (ref <5.7)
Mean Plasma Glucose: 82 mg/dL (ref <117)

## 2014-02-02 LAB — MAGNESIUM: Magnesium: 1.7 mg/dL (ref 1.5–2.5)

## 2014-02-02 LAB — IRON AND TIBC
%SAT: 14 % — AB (ref 20–55)
IRON: 30 ug/dL — AB (ref 42–145)
TIBC: 215 ug/dL — AB (ref 250–470)
UIBC: 185 ug/dL (ref 125–400)

## 2014-02-02 LAB — VITAMIN B12

## 2014-02-05 LAB — VITAMIN B1: Vitamin B1 (Thiamine): 10 nmol/L (ref 8–30)

## 2014-02-07 ENCOUNTER — Encounter: Payer: Managed Care, Other (non HMO) | Attending: General Surgery | Admitting: Dietician

## 2014-02-07 DIAGNOSIS — E669 Obesity, unspecified: Secondary | ICD-10-CM

## 2014-02-07 DIAGNOSIS — Z713 Dietary counseling and surveillance: Secondary | ICD-10-CM | POA: Insufficient documentation

## 2014-02-07 DIAGNOSIS — Z6829 Body mass index (BMI) 29.0-29.9, adult: Secondary | ICD-10-CM | POA: Diagnosis not present

## 2014-02-07 NOTE — Progress Notes (Signed)
  Follow-up visit:  12 Weeks Post-Operative RYGB Surgery  Medical Nutrition Therapy:  Appt start time: 1110 end time:  1140  Primary concerns today: Post-operative Bariatric Surgery Nutrition Management.  Zuri returns today stating she is able to tolerate more food and having no intolerances. She logs her foods into My Fitness pal and estimates that her daily calorie intake is 340 kcals. However, she is not having any weakness or fatigue. She sometimes has hunger pains if she does not eat frequently.   Surgery date: 10/31/2013 Surgery type: RYGB [facility] weight at [facility]: 265.5 lbs on 08/09/2013 Weight today:197.5 lbs Weight loss: 20.5 lbs Total weight loss: 68 lbs  Goal weight: 160 lbs  TANITA  BODY COMP RESULTS  10/17/2013 11/15/13 12/27/13 02/07/14   BMI (kg/m^2) 44 40.3 35.7 32.9   Fat Mass (lbs) 140 124.0 101.5 83   Fat Free Mass (lbs) 124.5 118.0 116.5 114.5   Total Body Water (lbs) 91 86.5 85.5 84    Preferred Learning Style:   No preference indicated   Learning Readiness:   Ready  24-hr recall: B (8-10 AM): 8oz GenePro shake (30g) Snk (AM):   L (12 PM): 2 oz tuna (14g) Snk (3 PM): 1/2 cup of cottage cheese with salsa (6g)  D (6 PM): 1/4 cup of ground meat and 1/4 cup vegetable Snk (7 PM): deli meat if protein is insufficient  Fluid intake: unable to determine amount; sips all day and able to take in more at a time per patient Estimated total protein intake: 60+g  Medications: see list Supplementation: taking  Using straws: no Drinking while eating: no Hair loss: "a lot of hair loss" (taking Biotin) Carbonated beverages: no N/V/D/C: constipation, resolved with miralax Dumping syndrome: no  Recent physical activity:  1 hour of cardio 4x a week; plans to start weight training  Progress Towards Goal(s):  In progress.  Handouts given during visit include:  Phase 3B lean protein + non-starchy vegetables   Nutritional Diagnosis:  Arroyo-3.3 Overweight/obesity related  to past poor dietary habits and physical inactivity as evidenced by patient w/ recent RYGB surgery following dietary guidelines for continued weight loss.     Intervention:  Nutrition counseling provided.  Teaching Method Utilized:  Visual Auditory  Barriers to learning/adherence to lifestyle change: none  Demonstrated degree of understanding via:  Teach Back   Monitoring/Evaluation:  Dietary intake, exercise, and body weight. Follow up in 2 months for 5 month post-op visit.

## 2014-02-07 NOTE — Patient Instructions (Addendum)
Goals:  Have something to eat every 3-5 hours  Try keeping a [employer] bar in your car to nibble on between meals if needed  Surgery date: 10/31/2013 Surgery type: RYGB Start weight at [facility]: 265.5 lbs on 08/09/2013 Weight today:197.5 lbs Weight loss: 20.5 lbs Total weight loss: 68 lbs  Goal weight: 160 lbs   TANITA  BODY COMP RESULTS  10/17/2013 11/15/13 12/27/13 02/07/14   BMI (kg/m^2) 44 40.3 35.7 32.9   Fat Mass (lbs) 140 124.0 101.5 83   Fat Free Mass (lbs) 124.5 118.0 116.5 114.5   Total Body Water (lbs) 91 86.5 85.5 84

## 2014-02-16 ENCOUNTER — Ambulatory Visit (INDEPENDENT_AMBULATORY_CARE_PROVIDER_SITE_OTHER): Payer: Managed Care, Other (non HMO) | Admitting: Psychiatry

## 2014-02-16 ENCOUNTER — Encounter (HOSPITAL_COMMUNITY): Payer: Self-pay | Admitting: Psychiatry

## 2014-02-16 VITALS — BP 108/65 | HR 77 | Ht 65.0 in | Wt 196.0 lb

## 2014-02-16 DIAGNOSIS — F331 Major depressive disorder, recurrent, moderate: Secondary | ICD-10-CM

## 2014-02-16 DIAGNOSIS — F333 Major depressive disorder, recurrent, severe with psychotic symptoms: Secondary | ICD-10-CM

## 2014-02-16 MED ORDER — BUPROPION HCL 75 MG PO TABS: ORAL_TABLET | ORAL | Status: DC

## 2014-02-16 MED ORDER — ZIPRASIDONE HCL 80 MG PO CAPS: 80.0000 mg | ORAL_CAPSULE | Freq: Two times a day (BID) | ORAL | Status: DC

## 2014-02-16 MED ORDER — FLUOXETINE HCL 40 MG PO CAPS: 40.0000 mg | ORAL_CAPSULE | Freq: Every day | ORAL | Status: DC

## 2014-02-16 MED ORDER — TEMAZEPAM 30 MG PO CAPS: 30.0000 mg | ORAL_CAPSULE | Freq: Every evening | ORAL | Status: DC | PRN
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
## 2014-02-20 ENCOUNTER — Other Ambulatory Visit (HOSPITAL_COMMUNITY): Payer: Self-pay | Admitting: Psychiatry

## 2014-03-03 ENCOUNTER — Telehealth (INDEPENDENT_AMBULATORY_CARE_PROVIDER_SITE_OTHER): Payer: Self-pay

## 2014-03-03 NOTE — Telephone Encounter (Signed)
Pt called to get her lab results. Informed pt that per Dr Excell Seltzer message.Iron level is a little low. You need to add one additional separate iron supplement daily. Pt verbalized understanding

## 2014-03-07 DIAGNOSIS — F411 Generalized anxiety disorder: Secondary | ICD-10-CM | POA: Diagnosis not present

## 2014-03-07 DIAGNOSIS — F333 Major depressive disorder, recurrent, severe with psychotic symptoms: Secondary | ICD-10-CM | POA: Diagnosis not present

## 2014-04-03 ENCOUNTER — Encounter: Payer: Self-pay | Admitting: Neurology

## 2014-04-03 ENCOUNTER — Ambulatory Visit (INDEPENDENT_AMBULATORY_CARE_PROVIDER_SITE_OTHER): Payer: Managed Care, Other (non HMO) | Admitting: Neurology

## 2014-04-03 ENCOUNTER — Ambulatory Visit (HOSPITAL_COMMUNITY): Payer: Self-pay | Admitting: Psychiatry

## 2014-04-03 VITALS — BP 100/64 | HR 78 | Ht 65.0 in | Wt 179.1 lb

## 2014-04-03 DIAGNOSIS — G43009 Migraine without aura, not intractable, without status migrainosus: Secondary | ICD-10-CM

## 2014-04-03 DIAGNOSIS — R51 Headache: Secondary | ICD-10-CM | POA: Diagnosis not present

## 2014-04-03 DIAGNOSIS — R519 Headache, unspecified: Secondary | ICD-10-CM

## 2014-04-03 MED ORDER — TOPIRAMATE 50 MG PO TABS: 50.0000 mg | ORAL_TABLET | Freq: Two times a day (BID) | ORAL | Status: DC

## 2014-04-03 NOTE — Progress Notes (Signed)
Follow-up Visit   Date: 04/03/2014    Katelyn [name] MRN: [id number] DOB: [birth year]   Interim History: Katelyn [name] is a 38 y.o. right-handed Caucasian female with history of history of diabetes, polycystic ovarian cystic syndrome, depression with psychosis, and mild OSA (not on CPAP) returning to the clinic for follow-up of chronic daily headaches.  The patient was accompanied to the clinic by her son.  History of present illness: Headaches started around 2012 after her ECT therapy (total of 12 sessions). She has no prior history of migraines or severe headaches prior to this. Headaches are described as "tightness, pressure" over the forehead. She has occasional radiation of pain to the back of her head. Headache is daily (ranked as 3/10), with varying intensity throughout the day. It is worsened by loud noises and stress. When severe, she has photophobia, nausea, and vomiting. Duration is 5-6 hours. Severe headaches occur once every 1-2 months. She is able to function with daily headaches, but with severe pain, she has to lay down. She has tried hot and cold compresses, ibuprofen, excedrin migraine, tylenol, and Aleve which does not help. She does not take anything for them anymore because nothing works. She was recently started on fioricet which helped when she took two.  She reports snoring loudly and feeling tired in the morning. She does not get restful sleep. Restoril was started for insomnia which helped her get 6 hours of uninterrupted sleep, but this is started to wear off.   Her psychiatrist also tried topamax 50mg  but there was no change in symptoms. Of all the things she has tried, she has felt the most relief with hot/cold compresses.  Of note, she takes ziprasidone 40 MG for depression with psychosis for a number of years and has generalized tremors.   - Follow-up 06/30/2013:  She reports having significant improvement after increasing topamax to 100mg /day, especially with  migraines.   She continues to have low-grade daily headache, but this also improved because it is tolerable now.  She reports having psychiatric hospitalization January 2015 for suicidal ideation and impulsive thoughts.     - Follow 09/30/2013:  She reports having migraines about once per month and takes naproxen.  She is having headache-free days, occuring about once per week. Otherwise, she still has low-grade dull headache.  Denies any side effects to topamax or plans for pregnancy.  She is approved for gastric bypass which potentially scheduled for early July.  She is very happy that headaches are better because she is less sedentary and is able to engage with her children.    - UPDATE 04/03/2014:  She underwent gastric bypass in July 2015 and has lost an incredible 88lb and continues to loose weight!  She is staying active and exercising also.  She reports migraines are better occuring once every 1-2 months.  She had no longer has all-day headache and she has 2-3 headache-free days per week.  She started taking magnesium supplements and noticed that it helped also.  Most days her headaches are 5/10 and reports that it is tolerable.     Medications:  Current Outpatient Prescriptions on File Prior to Visit  Medication Sig Dispense Refill  . buPROPion (WELLBUTRIN) 75 MG tablet Two tab in am and two tab in afternoon 360 tablet 0  . FLUoxetine (PROZAC) 40 MG capsule Take 1 capsule (40 mg total) by mouth daily. 90 capsule 0  . magnesium oxide (MAG-OX) 400 MG tablet Take 400 mg by mouth daily.    Marland Kitchen  Multiple Vitamin (MULTIVITAMIN WITH MINERALS) TABS tablet Take 1 tablet by mouth every morning.    . temazepam (RESTORIL) 30 MG capsule Take 1 capsule (30 mg total) by mouth at bedtime as needed for sleep. 30 capsule 1  . ziprasidone (GEODON) 80 MG capsule Take 1 capsule (80 mg total) by mouth 2 (two) times daily with a meal. 180 capsule 0   No current facility-administered medications on file prior to visit.      Allergies:  Allergies  Allergen Reactions  . Lithium     Catatonic state  . Other     Itching with epidural for c-section - pt thinks morphine was being given  . Penicillins     REACTION: Rash     Review of Systems:  CONSTITUTIONAL: No fevers, chills, night sweats, weight loss.   EYES: No visual changes or eye pain ENT: No hearing changes.  No history of nose bleeds.   RESPIRATORY: No cough, wheezing and shortness of breath.   CARDIOVASCULAR: Negative for chest pain, and palpitations.   GI: Negative for abdominal discomfort, blood in stools or black stools.  No recent change in bowel habits.   GU:  No history of incontinence.   MUSCLOSKELETAL: No history of joint pain or swelling.  No myalgias.   SKIN: Negative for lesions, rash, and itching.   ENDOCRINE: Negative for cold or heat intolerance, polydipsia or goiter.   PSYCH:  + depression or anxiety symptoms.   NEURO: As Above.   Vital Signs:  BP 100/64 mmHg  Pulse 78  Ht 5\' 5"  (1.651 m)  Wt 179 lb 1 oz (81.222 kg)  BMI 29.80 kg/m2  SpO2 97%  Neurological Exam: MENTAL STATUS including orientation to time, place, person, recent and remote memory, attention span and concentration, language, and fund of knowledge is normal.  Speech is not dysarthric.  CRANIAL NERVES: Pupils equal round and reactive to light.  Normal conjugate, extra-ocular eye movements in all directions of gaze.  No ptosis. Face is symmetric. Palate elevates symmetrically.  Tongue is midline and tremulous.  Occasional involuntary movements of the face is seen.  MOTOR:  Motor strength is 5/5 in all extremities. Intermittent mild bilateral hand intention tremors.  Tone is normal.  MSRs:  Reflexes are 2+/4 throughout.  COORDINATION/GAIT: Gait narrow based and stable.   Data: Lab Results  Component Value Date   VITAMINB12 >2000* 02/02/2014   Lab Results  Component Value Date   TSH 1.485 07/01/2013   MRI brain 12/2010: Normal examination. No  pituitary macroadenoma. No diagnosable microadenoma.    IMPRESSION/PLAN: 1.  Chronic daily headaches  - Clinically, doing great. Now with 2-3 headache-free days per week  - Refills provided for topiramate 50mg  BID.     - Continue taking magnesium oxide 400-600mg  daily 2.  Migraine without aura  - Occuring once per month  - Discussed starting triptan, but patient declined  - Recommended taking tylenol in combination with benadryl 25mg  and extra MgO 400mg  3.  Depression with psychosis, followed by Dr. Adele Schilder 4.  Praised her for her weight loss and encouraged her to stay active to keep weight off - she has lost an incredible 88lb following gastric bypass! 5.  Return to clinic in 1 year    The duration of this appointment visit was 20 minutes of face-to-face time with the patient.  Greater than 50% of this time was spent in counseling, explanation of diagnosis, planning of further management, and coordination of care.   Thank you for  allowing me to participate in patient's care.  If I can answer any additional questions, I would be pleased to do so.    Sincerely,    Donika K. Posey Pronto, DO

## 2014-04-03 NOTE — Patient Instructions (Signed)
1.  For severe migraines, take tylenol in combination with benadryl 25mg  and magnesium 400mg  2.  Continue your medications as you are taking them 3.  You look AMAZING!!  Keep up the good work. 4.  Return to clinic in 1 year

## 2014-04-11 ENCOUNTER — Encounter: Payer: Managed Care, Other (non HMO) | Attending: General Surgery | Admitting: Dietician

## 2014-04-11 DIAGNOSIS — Z6829 Body mass index (BMI) 29.0-29.9, adult: Secondary | ICD-10-CM | POA: Diagnosis not present

## 2014-04-11 DIAGNOSIS — Z48815 Encounter for surgical aftercare following surgery on the digestive system: Secondary | ICD-10-CM | POA: Diagnosis not present

## 2014-04-11 DIAGNOSIS — F333 Major depressive disorder, recurrent, severe with psychotic symptoms: Secondary | ICD-10-CM | POA: Diagnosis not present

## 2014-04-11 DIAGNOSIS — E669 Obesity, unspecified: Secondary | ICD-10-CM | POA: Diagnosis not present

## 2014-04-11 DIAGNOSIS — F411 Generalized anxiety disorder: Secondary | ICD-10-CM | POA: Diagnosis not present

## 2014-04-11 DIAGNOSIS — Z713 Dietary counseling and surveillance: Secondary | ICD-10-CM | POA: Diagnosis not present

## 2014-04-11 NOTE — Progress Notes (Signed)
  Follow-up visit:  5.5 months Post-Operative RYGB Surgery  Medical Nutrition Therapy:  Appt start time: 1000 end time:  1035  Primary concerns today: Post-operative Bariatric Surgery Nutrition Management.  Katelyn [name] returns today having lost another 25 lbs in the last 2 months. She states "every time I get to a lower number I change my goal weight." She has a history of binge eating, anorexia, and bulimia. She states she is working with her therapist to combat these thoughts; sees her therapist every 2 weeks. Having a lot of hair loss and trying to increase daily protein intake. Although she is able to eat more food, she reports having to drink 2 protein shakes per day to get more protein. Has increased calorie intake to 500-700 calories per day.   Surgery date: 10/31/2013 Surgery type: RYGB [facility] weight at [facility]: 265.5 lbs on 08/09/2013 (306 per patient) Weight today:172.5 lbs Weight loss: 25 lbs Total weight loss: 93 lbs (133.5 lbs) Goal weight: 160 lbs   TANITA  BODY COMP RESULTS  10/17/2013 11/15/13 12/27/13 02/07/14 04/11/14   BMI (kg/m^2) 44 40.3 35.7 32.9 28.7   Fat Mass (lbs) 140 124.0 101.5 83 65.5   Fat Free Mass (lbs) 124.5 118.0 116.5 114.5 107   Total Body Water (lbs) 91 86.5 85.5 84 78.5    Preferred Learning Style:   No preference indicated   Learning Readiness:   Ready  24-hr recall: B (8-10 AM): 3/4 cup unsweetened almond milk with whey protein powder (20g) Snk (11 AM): 3 oz deli [place] (18-21g)   L (3 PM): 1/2 cup chili or yogurt with PB2 (6g) Snk: string cheese (6g) D (6 PM):protein shake (20g) Snk (PM): string cheese (6g)  Fluid intake: unable to determine amount; sips all day and able to take in more at a time per patient Estimated total protein intake: 70+g  Medications: see list Supplementation: taking  Using straws: no Drinking while eating: no Hair loss: "a lot of hair loss" (taking Biotin) Carbonated beverages: no N/V/D/C: constipation  resolving Dumping syndrome: no  Recent physical activity:  1 hour of cardio 4x a week  Progress Towards Goal(s):  In progress.   Nutritional Diagnosis:  [place]-3.3 Overweight/obesity related to past poor dietary habits and physical inactivity as evidenced by patient w/ recent RYGB surgery following dietary guidelines for continued weight loss.    Intervention:  Nutrition counseling provided. -Have about 15 grams of carbohydrate per meal (45-60 grams per day) -Try weighing yourself every other day -Add nuts, spaghetti squash, small amount of fruit *Focus on non-scale victories: crossing legs comfortably, sitting in a chair comfortably, feeling good in clothes  Teaching Method Utilized:  Visual Auditory  Barriers to learning/adherence to lifestyle change: none  Demonstrated degree of understanding via:  Teach Back   Monitoring/Evaluation:  Dietary intake, exercise, and body weight. Follow up in 2 months for 7 month post-op visit.

## 2014-04-11 NOTE — Patient Instructions (Addendum)
-  Have about 15 grams of carbohydrate per meal (45-60 grams per day) -Try weighing yourself every other day -Add nuts, spaghetti squash, small amount of fruit *Focus on non-scale victories: crossing legs comfortably, sitting in a chair comfortably, feeling good in clothes  TANITA  BODY COMP RESULTS  10/17/2013 11/15/13 12/27/13 02/07/14 04/11/14   BMI (kg/m^2) 44 40.3 35.7 32.9 28.7   Fat Mass (lbs) 140 124.0 101.5 83 65.5   Fat Free Mass (lbs) 124.5 118.0 116.5 114.5 107   Total Body Water (lbs) 91 86.5 85.5 84 78.5

## 2014-04-12 ENCOUNTER — Encounter (HOSPITAL_COMMUNITY): Payer: Self-pay | Admitting: Psychiatry

## 2014-04-12 ENCOUNTER — Ambulatory Visit (INDEPENDENT_AMBULATORY_CARE_PROVIDER_SITE_OTHER): Payer: Managed Care, Other (non HMO) | Admitting: Psychiatry

## 2014-04-12 VITALS — BP 100/61 | HR 75 | Ht 65.0 in | Wt 172.8 lb

## 2014-04-12 DIAGNOSIS — F333 Major depressive disorder, recurrent, severe with psychotic symptoms: Secondary | ICD-10-CM

## 2014-04-12 DIAGNOSIS — F331 Major depressive disorder, recurrent, moderate: Secondary | ICD-10-CM

## 2014-04-12 MED ORDER — DIAZEPAM 5 MG PO TABS: 5.0000 mg | ORAL_TABLET | Freq: Three times a day (TID) | ORAL | Status: DC | PRN

## 2014-04-12 MED ORDER — TEMAZEPAM 30 MG PO CAPS: 30.0000 mg | ORAL_CAPSULE | Freq: Every evening | ORAL | Status: DC | PRN
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
## 2014-04-13 ENCOUNTER — Telehealth (HOSPITAL_COMMUNITY): Payer: Self-pay

## 2014-04-13 ENCOUNTER — Other Ambulatory Visit (HOSPITAL_COMMUNITY): Payer: Self-pay | Admitting: Psychiatry

## 2014-04-13 DIAGNOSIS — F331 Major depressive disorder, recurrent, moderate: Secondary | ICD-10-CM

## 2014-04-13 MED ORDER — LAMOTRIGINE 25 MG PO TABS: ORAL_TABLET | ORAL | Status: DC

## 2014-04-17 ENCOUNTER — Ambulatory Visit (HOSPITAL_COMMUNITY): Payer: Self-pay | Admitting: Psychiatry

## 2014-05-01 ENCOUNTER — Ambulatory Visit (HOSPITAL_COMMUNITY): Payer: Self-pay | Admitting: Psychiatry

## 2014-05-04 DIAGNOSIS — F333 Major depressive disorder, recurrent, severe with psychotic symptoms: Secondary | ICD-10-CM | POA: Diagnosis not present

## 2014-05-04 DIAGNOSIS — F411 Generalized anxiety disorder: Secondary | ICD-10-CM | POA: Diagnosis not present

## 2014-05-09 ENCOUNTER — Encounter (HOSPITAL_COMMUNITY): Payer: Self-pay | Admitting: Psychiatry

## 2014-05-09 ENCOUNTER — Other Ambulatory Visit (HOSPITAL_COMMUNITY): Payer: 59 | Attending: Psychiatry | Admitting: Psychiatry

## 2014-05-09 DIAGNOSIS — F333 Major depressive disorder, recurrent, severe with psychotic symptoms: Secondary | ICD-10-CM | POA: Insufficient documentation

## 2014-05-09 DIAGNOSIS — Z9884 Bariatric surgery status: Secondary | ICD-10-CM | POA: Insufficient documentation

## 2014-05-09 DIAGNOSIS — F419 Anxiety disorder, unspecified: Secondary | ICD-10-CM | POA: Insufficient documentation

## 2014-05-09 DIAGNOSIS — G4710 Hypersomnia, unspecified: Secondary | ICD-10-CM | POA: Insufficient documentation

## 2014-05-09 DIAGNOSIS — F4310 Post-traumatic stress disorder, unspecified: Secondary | ICD-10-CM

## 2014-05-09 DIAGNOSIS — F331 Major depressive disorder, recurrent, moderate: Secondary | ICD-10-CM

## 2014-05-09 MED ORDER — TEMAZEPAM 15 MG PO CAPS: 15.0000 mg | ORAL_CAPSULE | Freq: Every evening | ORAL | Status: DC | PRN
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
## 2014-05-10 ENCOUNTER — Other Ambulatory Visit (HOSPITAL_COMMUNITY): Payer: 59 | Admitting: Psychiatry

## 2014-05-10 DIAGNOSIS — F4310 Post-traumatic stress disorder, unspecified: Secondary | ICD-10-CM

## 2014-05-10 DIAGNOSIS — G4710 Hypersomnia, unspecified: Secondary | ICD-10-CM | POA: Diagnosis not present

## 2014-05-10 DIAGNOSIS — Z9884 Bariatric surgery status: Secondary | ICD-10-CM | POA: Diagnosis not present

## 2014-05-10 DIAGNOSIS — F419 Anxiety disorder, unspecified: Secondary | ICD-10-CM | POA: Diagnosis not present

## 2014-05-10 DIAGNOSIS — F333 Major depressive disorder, recurrent, severe with psychotic symptoms: Secondary | ICD-10-CM | POA: Diagnosis not present

## 2014-05-10 DIAGNOSIS — F331 Major depressive disorder, recurrent, moderate: Secondary | ICD-10-CM

## 2014-05-11 ENCOUNTER — Other Ambulatory Visit (HOSPITAL_COMMUNITY): Payer: 59 | Admitting: Psychiatry

## 2014-05-11 DIAGNOSIS — F419 Anxiety disorder, unspecified: Secondary | ICD-10-CM | POA: Diagnosis not present

## 2014-05-11 DIAGNOSIS — F331 Major depressive disorder, recurrent, moderate: Secondary | ICD-10-CM

## 2014-05-11 DIAGNOSIS — G4710 Hypersomnia, unspecified: Secondary | ICD-10-CM | POA: Diagnosis not present

## 2014-05-11 DIAGNOSIS — F333 Major depressive disorder, recurrent, severe with psychotic symptoms: Secondary | ICD-10-CM | POA: Diagnosis not present

## 2014-05-11 DIAGNOSIS — Z9884 Bariatric surgery status: Secondary | ICD-10-CM | POA: Diagnosis not present

## 2014-05-11 NOTE — Progress Notes (Signed)
    Daily Group Progress Note  Program: IOP  Group Time: 9:00-10:30  Participation Level: Active  Behavioral Response: Appropriate  Type of Therapy:  Group Therapy  Summary of Progress: Pt. Presents with depressed, flat affect. Pt. Is having difficulty connecting with the group, reported that she does not identify with themes in the group. Pt. Discussed in first few minutes of the group her bariatric surgery and ongoing problems with self-image post weight-loss.     Group Time: 10:30-12:00  Participation Level:  Active  Behavioral Response: Appropriate  Type of Therapy: Psycho-education Group  Summary of Progress: Pt. Participated in self-acceptance guided meditation exercise.   Nancie Neas, LPC

## 2014-05-12 ENCOUNTER — Other Ambulatory Visit (HOSPITAL_COMMUNITY): Payer: 59 | Admitting: Psychiatry

## 2014-05-12 DIAGNOSIS — G4710 Hypersomnia, unspecified: Secondary | ICD-10-CM | POA: Diagnosis not present

## 2014-05-12 DIAGNOSIS — F4310 Post-traumatic stress disorder, unspecified: Secondary | ICD-10-CM

## 2014-05-12 DIAGNOSIS — F333 Major depressive disorder, recurrent, severe with psychotic symptoms: Secondary | ICD-10-CM | POA: Diagnosis not present

## 2014-05-12 DIAGNOSIS — F419 Anxiety disorder, unspecified: Secondary | ICD-10-CM | POA: Diagnosis not present

## 2014-05-12 DIAGNOSIS — F331 Major depressive disorder, recurrent, moderate: Secondary | ICD-10-CM

## 2014-05-12 DIAGNOSIS — Z9884 Bariatric surgery status: Secondary | ICD-10-CM | POA: Diagnosis not present

## 2014-05-12 NOTE — Progress Notes (Signed)
    Daily Group Progress Note  Program: IOP  Group Time: 9:00-10:30  Participation Level: Active  Behavioral Response: Appropriate  Type of Therapy:  Group Therapy  Summary of Progress: Pt. Continues to present with flat affect. Pt. Reported that she has been feeling very tired and not sure to attribute to the worsening of her depression or nutrition. Pt. Reports that she has been feeling more anxious, heart racing.      Group Time: 10:30-12:00  Participation Level:  Active  Behavioral Response: Appropriate  Type of Therapy: Psycho-education Group  Summary of Progress: Pt. Participated in grief and loss facilitated by Jeanella Craze.   Nancie Neas, LPC

## 2014-05-12 NOTE — Progress Notes (Signed)
    Daily Group Progress Note  Program: IOP  Group Time: 9:00-10:30  Participation Level: Active  Behavioral Response: Appropriate  Type of Therapy:  Group Therapy  Summary of Progress: Pt. Presented as quiet with flat affect. Pt. Reported that she was feeling anxious and was nervous about her weigh in with her doctor.    Group Time: 10:30-12:00  Participation Level:  None  Behavioral Response: Appropriate  Type of Therapy: Psycho-education Group  Summary of Progress: Pt. Did not attend due to previously scheduled appointment related to gastric bypass.   [facility]

## 2014-05-13 ENCOUNTER — Other Ambulatory Visit: Payer: Self-pay | Admitting: General Surgery

## 2014-05-14 LAB — COMPREHENSIVE METABOLIC PANEL
ALK PHOS: 80 U/L (ref 39–117)
ALT: 20 U/L (ref 0–35)
AST: 18 U/L (ref 0–37)
Albumin: 4.1 g/dL (ref 3.5–5.2)
BUN: 15 mg/dL (ref 6–23)
CALCIUM: 9.2 mg/dL (ref 8.4–10.5)
CO2: 22 mEq/L (ref 19–32)
Chloride: 106 mEq/L (ref 96–112)
Creat: 0.82 mg/dL (ref 0.50–1.10)
GLUCOSE: 75 mg/dL (ref 70–99)
POTASSIUM: 4.3 meq/L (ref 3.5–5.3)
Sodium: 139 mEq/L (ref 135–145)
TOTAL PROTEIN: 6.9 g/dL (ref 6.0–8.3)
Total Bilirubin: 0.6 mg/dL (ref 0.2–1.2)

## 2014-05-14 LAB — CBC WITH DIFFERENTIAL/PLATELET
BASOS PCT: 0 % (ref 0–1)
Basophils Absolute: 0 10*3/uL (ref 0.0–0.1)
Eosinophils Absolute: 0.1 10*3/uL (ref 0.0–0.7)
Eosinophils Relative: 2 % (ref 0–5)
HCT: 44.2 % (ref 36.0–46.0)
Hemoglobin: 14.3 g/dL (ref 12.0–15.0)
LYMPHS PCT: 25 % (ref 12–46)
Lymphs Abs: 1.5 10*3/uL (ref 0.7–4.0)
MCH: 32.4 pg (ref 26.0–34.0)
MCHC: 32.4 g/dL (ref 30.0–36.0)
MCV: 100.2 fL — ABNORMAL HIGH (ref 78.0–100.0)
MPV: 10.4 fL (ref 8.6–12.4)
Monocytes Absolute: 0.4 10*3/uL (ref 0.1–1.0)
Monocytes Relative: 6 % (ref 3–12)
NEUTROS PCT: 67 % (ref 43–77)
Neutro Abs: 4 10*3/uL (ref 1.7–7.7)
Platelets: 348 10*3/uL (ref 150–400)
RBC: 4.41 MIL/uL (ref 3.87–5.11)
RDW: 13.6 % (ref 11.5–15.5)
WBC: 5.9 10*3/uL (ref 4.0–10.5)

## 2014-05-14 LAB — LIPID PANEL
CHOL/HDL RATIO: 3 ratio
CHOLESTEROL: 103 mg/dL (ref 0–200)
HDL: 34 mg/dL — AB (ref >39)
LDL Cholesterol: 57 mg/dL (ref 0–99)
Triglycerides: 61 mg/dL (ref <150)
VLDL: 12 mg/dL (ref 0–40)

## 2014-05-14 LAB — IRON AND TIBC
%SAT: 23 % (ref 20–55)
IRON: 66 ug/dL (ref 42–145)
TIBC: 284 ug/dL (ref 250–470)
UIBC: 218 ug/dL (ref 125–400)

## 2014-05-14 LAB — FOLATE: Folate: 12.2 ng/mL

## 2014-05-14 LAB — TSH: TSH: 1.474 u[IU]/mL (ref 0.350–4.500)

## 2014-05-14 LAB — VITAMIN B12: Vitamin B-12: 758 pg/mL (ref 211–911)

## 2014-05-15 ENCOUNTER — Other Ambulatory Visit (HOSPITAL_COMMUNITY): Payer: 59 | Admitting: Psychiatry

## 2014-05-15 DIAGNOSIS — F331 Major depressive disorder, recurrent, moderate: Secondary | ICD-10-CM

## 2014-05-15 DIAGNOSIS — F333 Major depressive disorder, recurrent, severe with psychotic symptoms: Secondary | ICD-10-CM | POA: Diagnosis not present

## 2014-05-15 DIAGNOSIS — F419 Anxiety disorder, unspecified: Secondary | ICD-10-CM | POA: Diagnosis not present

## 2014-05-15 DIAGNOSIS — Z9884 Bariatric surgery status: Secondary | ICD-10-CM | POA: Diagnosis not present

## 2014-05-15 DIAGNOSIS — G4710 Hypersomnia, unspecified: Secondary | ICD-10-CM | POA: Diagnosis not present
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
## 2014-05-16 ENCOUNTER — Other Ambulatory Visit (HOSPITAL_COMMUNITY): Payer: 59 | Admitting: Psychiatry

## 2014-05-16 ENCOUNTER — Ambulatory Visit (HOSPITAL_COMMUNITY): Payer: Self-pay | Admitting: Psychiatry

## 2014-05-16 DIAGNOSIS — Z9884 Bariatric surgery status: Secondary | ICD-10-CM | POA: Diagnosis not present

## 2014-05-16 DIAGNOSIS — F331 Major depressive disorder, recurrent, moderate: Secondary | ICD-10-CM

## 2014-05-16 DIAGNOSIS — F333 Major depressive disorder, recurrent, severe with psychotic symptoms: Secondary | ICD-10-CM | POA: Diagnosis not present

## 2014-05-16 DIAGNOSIS — G4710 Hypersomnia, unspecified: Secondary | ICD-10-CM | POA: Diagnosis not present

## 2014-05-16 DIAGNOSIS — F419 Anxiety disorder, unspecified: Secondary | ICD-10-CM | POA: Diagnosis not present

## 2014-05-16 LAB — COPPER, SERUM: Copper: 127 ug/dL (ref 70–175)

## 2014-05-16 LAB — ZINC: Zinc: 86 ug/dL (ref 60–130)

## 2014-05-16 LAB — SELENIUM SERUM: SELENIUM, BLOOD: 107 ug/L (ref 63–160)

## 2014-05-17 ENCOUNTER — Other Ambulatory Visit (HOSPITAL_COMMUNITY): Payer: 59 | Admitting: Psychiatry

## 2014-05-17 DIAGNOSIS — F333 Major depressive disorder, recurrent, severe with psychotic symptoms: Secondary | ICD-10-CM | POA: Diagnosis not present

## 2014-05-17 DIAGNOSIS — F331 Major depressive disorder, recurrent, moderate: Secondary | ICD-10-CM

## 2014-05-17 DIAGNOSIS — G4710 Hypersomnia, unspecified: Secondary | ICD-10-CM | POA: Diagnosis not present

## 2014-05-17 DIAGNOSIS — Z9884 Bariatric surgery status: Secondary | ICD-10-CM | POA: Diagnosis not present

## 2014-05-17 DIAGNOSIS — F419 Anxiety disorder, unspecified: Secondary | ICD-10-CM | POA: Diagnosis not present

## 2014-05-17 NOTE — Progress Notes (Signed)
    Daily Group Progress Note  Program: IOP  Group Time: 9:00-10:30  Participation Level: Minimal  Behavioral Response: Appropriate  Type of Therapy:  Group Therapy  Summary of Progress: Pt. Continues to present with flat affect, poor recall of events, continues to have difficulty connecting with group themes. Pt. Continues to report marital problems and history of medication noncompliance.      Group Time: 10:30-12:00  Participation Level:  Minimal  Behavioral Response: Appropriate  Type of Therapy: Psycho-education Group  Summary of Progress: Pt. Watched Marilynn Latino video on developing self-compassion. Pt. Did not not make any notable observations or reflections about the topic.  Nancie Neas, LPC

## 2014-05-17 NOTE — Progress Notes (Signed)
    Daily Group Progress Note  Program: IOP  Group Time: 9:00-10:30  Participation Level: Minimal  Behavioral Response: Appropriate  Type of Therapy:  Group Therapy  Summary of Progress: Pt had minimal participation today. Pt.Continues to present with flat affect, poor recall of events, continues to have difficulty connecting with group themes. Pt. Continues to report marital problems and history of medication noncompliance.      Group Time: 10:30-12:00  Participation Level:  Active  Behavioral Response: Appropriate  Type of Therapy: Psycho-education Group  Summary of Progress: Pt. had active participation. Pt. created an "absolute yes list" and listed items she would like to say yes to. She became aware of her priorities in life. Pt. made minimal reflections about the topic.   Nancie Neas, LPC

## 2014-05-18 ENCOUNTER — Other Ambulatory Visit (HOSPITAL_COMMUNITY): Payer: 59 | Admitting: Psychiatry

## 2014-05-18 DIAGNOSIS — F333 Major depressive disorder, recurrent, severe with psychotic symptoms: Secondary | ICD-10-CM | POA: Diagnosis not present

## 2014-05-18 DIAGNOSIS — F331 Major depressive disorder, recurrent, moderate: Secondary | ICD-10-CM

## 2014-05-18 DIAGNOSIS — F419 Anxiety disorder, unspecified: Secondary | ICD-10-CM | POA: Diagnosis not present

## 2014-05-18 DIAGNOSIS — Z9884 Bariatric surgery status: Secondary | ICD-10-CM | POA: Diagnosis not present

## 2014-05-18 DIAGNOSIS — G4710 Hypersomnia, unspecified: Secondary | ICD-10-CM | POA: Diagnosis not present

## 2014-05-18 LAB — VITAMIN D 1,25 DIHYDROXY
Vitamin D 1, 25 (OH)2 Total: 65 pg/mL (ref 18–72)
Vitamin D2 1, 25 (OH)2: <8 pg/mL
Vitamin D3 1, 25 (OH)2: 65 pg/mL

## 2014-05-18 LAB — VITAMIN A: VITAMIN A (RETINOIC ACID): 31 ug/dL — AB (ref 38–98)

## 2014-05-19 ENCOUNTER — Ambulatory Visit (HOSPITAL_COMMUNITY): Payer: Self-pay | Admitting: Psychiatry

## 2014-05-19 ENCOUNTER — Other Ambulatory Visit (HOSPITAL_COMMUNITY): Payer: 59

## 2014-05-19 LAB — VITAMIN B1: Vitamin B1 (Thiamine): 12 nmol/L (ref 8–30)

## 2014-05-21 ENCOUNTER — Other Ambulatory Visit (HOSPITAL_COMMUNITY): Payer: Self-pay | Admitting: Psychiatry

## 2014-05-22 ENCOUNTER — Other Ambulatory Visit (HOSPITAL_COMMUNITY): Payer: 59

## 2014-05-22 NOTE — Progress Notes (Signed)
    Daily Group Progress Note  Program: IOP  Group Time:  9:00-10:30  Participation Level: Minimal  Behavioral Response: Appropriate  Type of Therapy:  Group Therapy  Summary of Progress: Pt had no participation and was asleep majority of group. Pt. Continues to present with flat affect, poor recall of events, continues to have difficulty connecting with group themes. Pt. reports of getting 6 hours of sleep last night and still being tired.      Group Time: 10:30-12:00  Participation Level:  Active  Behavioral Response: Appropriate  Type of Therapy: Psycho-education Group  Summary of Progress: Pt. had minimal participation. Pt. watched Almond Lint video on the power of vulnerability and completed a self-compassion exercise. Pt. made minimal observations and reflections about the topic.  Nancie Neas, LPC

## 2014-05-23 ENCOUNTER — Other Ambulatory Visit (HOSPITAL_COMMUNITY): Payer: 59

## 2014-05-23 NOTE — Telephone Encounter (Signed)
Telephone call with patient who states she has enough of all medications to last until appointment 05/29/14.  New order can be written that date.

## 2014-05-24 ENCOUNTER — Other Ambulatory Visit (HOSPITAL_COMMUNITY): Payer: 59 | Admitting: Psychiatry

## 2014-05-24 DIAGNOSIS — F333 Major depressive disorder, recurrent, severe with psychotic symptoms: Secondary | ICD-10-CM | POA: Diagnosis not present

## 2014-05-24 DIAGNOSIS — Z9884 Bariatric surgery status: Secondary | ICD-10-CM | POA: Diagnosis not present

## 2014-05-24 DIAGNOSIS — F419 Anxiety disorder, unspecified: Secondary | ICD-10-CM | POA: Diagnosis not present

## 2014-05-24 DIAGNOSIS — F331 Major depressive disorder, recurrent, moderate: Secondary | ICD-10-CM

## 2014-05-24 DIAGNOSIS — G4710 Hypersomnia, unspecified: Secondary | ICD-10-CM | POA: Diagnosis not present

## 2014-05-24 NOTE — Progress Notes (Signed)
  [facility] Discharge Summary  [name] [id number]  Admission date: 05/09/2014 Discharge date: 05/26/2014  Reason for admission: depression and anxiety  Chemical Use History: none  Family of Origin Issues: none  Progress in Program Toward Treatment Goals: Katelyn [name] says she is about the same as when she came in to the program.  She has definitely felt better as she attended, she said because when she was out for the week end and snow days she felt worse.  Consequently she is making plans to keep herself busy when the program ends.  She has noticed that the tiredness and sleepiness come from the morning dose of Geodon and she was told she could take both Geodon doses at bedtime.  She is not suicidal and is okay with discharge.  Progress (rationale): Katelyn [name] benefited from the daily structure and talking.  She knows the coping skills already and is now planning to put them into practice more intentionally she says.    Clarene Reamer, MD 05/24/2014

## 2014-05-25 ENCOUNTER — Telehealth (HOSPITAL_COMMUNITY): Payer: Self-pay | Admitting: Psychiatry

## 2014-05-25 ENCOUNTER — Other Ambulatory Visit (HOSPITAL_COMMUNITY): Payer: 59 | Admitting: Psychiatry

## 2014-05-25 DIAGNOSIS — F333 Major depressive disorder, recurrent, severe with psychotic symptoms: Secondary | ICD-10-CM | POA: Diagnosis not present

## 2014-05-25 DIAGNOSIS — F331 Major depressive disorder, recurrent, moderate: Secondary | ICD-10-CM

## 2014-05-25 DIAGNOSIS — F419 Anxiety disorder, unspecified: Secondary | ICD-10-CM | POA: Diagnosis not present

## 2014-05-25 DIAGNOSIS — Z9884 Bariatric surgery status: Secondary | ICD-10-CM | POA: Diagnosis not present

## 2014-05-25 DIAGNOSIS — G4710 Hypersomnia, unspecified: Secondary | ICD-10-CM | POA: Diagnosis not present

## 2014-05-25 NOTE — Progress Notes (Signed)
    Daily Group Progress Note  Program: IOP  Group Time: 9:00-10:30  Participation Level: Active  Behavioral Response: Appropriate  Type of Therapy:  Group Therapy  Summary of Progress: Pt. Was one hour late for group due to children's school schedule. Pt. Reported that she had suicidal thoughts last night that were triggered by her son's defiance and challenging her authority. Pt. Reports ongoing difficulty related to son's disrespect of her. Pt. Reports that she called her father who provided social support for. Pt. Reports that after panic attack and suicidal thoughts she was able to sleep for 6 hours which she recognized as a positive change for her.      Group Time: 10:30-12:00  Participation Level:  Active  Behavioral Response: Appropriate  Type of Therapy: Psycho-education Group  Summary of Progress: Sheritta was more active than usual in group. Summary of Progress: Pt. continued to share about her family issues with her husband and children. She seemed to be connecting with the appropriate themes. Pt. participated in a guided meditation on affirmation.    Nancie Neas, LPC

## 2014-05-25 NOTE — Progress Notes (Signed)
    Daily Group Progress Note  Program: IOP Group Time: 9:00-10:30   Participation Level: Minimal   Behavioral Response: Appropriate   Type of Therapy:  Group Therapy   Summary of Progress: Pt. Continues to present with flat affect, poor recall of events, continues to have difficulty connecting with group themes. Pt. claims to have no interests or hobbies.         Group Time: 10:30-12:00   Participation Level:  Minimal   Behavioral Response: Appropriate   Type of Therapy: Psycho-education Group  Summary of Progress: Pt. participated in meditation exercise. Pt. Did not make any notable observations or reflections about the topic.   Nancie Neas, LPC

## 2014-05-26 ENCOUNTER — Other Ambulatory Visit (HOSPITAL_COMMUNITY): Payer: 59 | Admitting: Psychiatry

## 2014-05-26 DIAGNOSIS — F331 Major depressive disorder, recurrent, moderate: Secondary | ICD-10-CM

## 2014-05-26 DIAGNOSIS — G4710 Hypersomnia, unspecified: Secondary | ICD-10-CM | POA: Diagnosis not present

## 2014-05-26 DIAGNOSIS — F333 Major depressive disorder, recurrent, severe with psychotic symptoms: Secondary | ICD-10-CM | POA: Diagnosis not present

## 2014-05-26 DIAGNOSIS — F419 Anxiety disorder, unspecified: Secondary | ICD-10-CM | POA: Diagnosis not present

## 2014-05-26 DIAGNOSIS — Z9884 Bariatric surgery status: Secondary | ICD-10-CM | POA: Diagnosis not present

## 2014-05-26 NOTE — Progress Notes (Signed)
    Daily Group Progress Note  Program: IOP  Group Time: 9:00-10:30  Participation Level: Active  Behavioral Response: Appropriate  Type of Therapy:  Group Therapy  Summary of Progress: Pt. Prepared for discharge. Pt. Presented with slightly brightened affect, pt. Was open to feedback from others regarding setting healthy boundaries with her children and husband, and exercising better self care.      Group Time: 10:30-12:00  Participation Level:  Active  Behavioral Response: Appropriate  Type of Therapy: Psycho-education Group  Summary of Progress: Pt. Participated in grief and loss facilitated by Jeanella Craze.   Nancie Neas, LPC
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
## 2014-05-26 NOTE — Patient Instructions (Signed)
Patient completed MH-IOP today.  Will follow up with Dr. Adele Schilder on 05-29-14 @ 8:30 a.m and Gracelyn Nurse, LCSW on 06-01-14 @ 10 a.m.  Encouraged support groups.

## 2014-05-29 ENCOUNTER — Ambulatory Visit (INDEPENDENT_AMBULATORY_CARE_PROVIDER_SITE_OTHER): Payer: Managed Care, Other (non HMO) | Admitting: Psychiatry

## 2014-05-29 ENCOUNTER — Encounter (HOSPITAL_COMMUNITY): Payer: Self-pay | Admitting: Psychiatry

## 2014-05-29 ENCOUNTER — Other Ambulatory Visit (HOSPITAL_COMMUNITY): Payer: Managed Care, Other (non HMO)

## 2014-05-29 VITALS — BP 89/63 | HR 79 | Ht 65.0 in | Wt 155.6 lb

## 2014-05-29 DIAGNOSIS — F331 Major depressive disorder, recurrent, moderate: Secondary | ICD-10-CM

## 2014-05-29 DIAGNOSIS — F323 Major depressive disorder, single episode, severe with psychotic features: Secondary | ICD-10-CM

## 2014-05-29 MED ORDER — CLONAZEPAM 0.5 MG PO TABS: 0.5000 mg | ORAL_TABLET | Freq: Two times a day (BID) | ORAL | Status: DC

## 2014-05-29 MED ORDER — BUPROPION HCL 75 MG PO TABS: ORAL_TABLET | ORAL | Status: DC

## 2014-05-29 MED ORDER — ZIPRASIDONE HCL 80 MG PO CAPS: ORAL_CAPSULE | ORAL | Status: DC

## 2014-05-29 MED ORDER — TRAZODONE HCL 100 MG PO TABS: ORAL_TABLET | ORAL | Status: DC
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
## 2014-05-30 ENCOUNTER — Other Ambulatory Visit (HOSPITAL_COMMUNITY): Payer: Managed Care, Other (non HMO)

## 2014-05-31 ENCOUNTER — Other Ambulatory Visit (HOSPITAL_COMMUNITY): Payer: Managed Care, Other (non HMO)

## 2014-06-01 ENCOUNTER — Other Ambulatory Visit (HOSPITAL_COMMUNITY): Payer: Managed Care, Other (non HMO)

## 2014-06-02 ENCOUNTER — Other Ambulatory Visit (HOSPITAL_COMMUNITY): Payer: Managed Care, Other (non HMO)

## 2014-06-02 DIAGNOSIS — F333 Major depressive disorder, recurrent, severe with psychotic symptoms: Secondary | ICD-10-CM | POA: Diagnosis not present

## 2014-06-02 DIAGNOSIS — F411 Generalized anxiety disorder: Secondary | ICD-10-CM | POA: Diagnosis not present

## 2014-06-05 ENCOUNTER — Other Ambulatory Visit (HOSPITAL_COMMUNITY): Payer: Managed Care, Other (non HMO)

## 2014-06-06 ENCOUNTER — Telehealth: Payer: Self-pay | Admitting: Dietician

## 2014-06-06 ENCOUNTER — Other Ambulatory Visit (HOSPITAL_COMMUNITY): Payer: Managed Care, Other (non HMO)

## 2014-06-06 NOTE — Telephone Encounter (Signed)
Email received 06/03/14: Katelyn [name], This is Katelyn [name] and I had GB in July. Since the last time I have seen you I have been drinking only protein shakes.  I have become afraid to eat. I don't feel I can control how much I eat and will eat to much. The weight is not coming off as easy. I am seeing my therapist.  I have forgotten the rules to eating. 80gm of protein.  How many carbs a day?  I want to still lose. How many calories a day?  Can't wait for my appointment! Thank you, Katelyn [name]  Reply to email on 06/03/14: Katelyn [name], I can't wait for your appointment either! It is absolutely imperative that you eat real food! Aim to eat real food at least twice a day and replace 1 meal with a protein shake if you want to. It is normal for weight loss to begin to slow at this point and you can certainly continue to lose while eating real food. 80 grams of protein is a good goal (60 gram minimum). Shoot for 15 grams of carbs 4x a day. Keep it to 45 grams total per day if that makes you feel more comfortable. I'd rather you count grams and servings rather than calories. When is your next appointment? Katelyn [name]  Patient reply: Katelyn [name], I see you Feb 16th. I will aim for two meals a day and a protein shake. Thank you, Katelyn [name]

## 2014-06-07 ENCOUNTER — Other Ambulatory Visit (HOSPITAL_COMMUNITY): Payer: Managed Care, Other (non HMO)

## 2014-06-08 ENCOUNTER — Other Ambulatory Visit (HOSPITAL_COMMUNITY): Payer: Managed Care, Other (non HMO)

## 2014-06-09 ENCOUNTER — Other Ambulatory Visit (HOSPITAL_COMMUNITY): Payer: Managed Care, Other (non HMO)

## 2014-06-12 ENCOUNTER — Other Ambulatory Visit (HOSPITAL_COMMUNITY): Payer: Managed Care, Other (non HMO)

## 2014-06-13 ENCOUNTER — Ambulatory Visit: Payer: Self-pay | Admitting: Dietician

## 2014-06-14 ENCOUNTER — Encounter: Payer: Managed Care, Other (non HMO) | Attending: General Surgery | Admitting: Dietician

## 2014-06-14 ENCOUNTER — Other Ambulatory Visit (HOSPITAL_COMMUNITY): Payer: Managed Care, Other (non HMO)

## 2014-06-14 DIAGNOSIS — Z6829 Body mass index (BMI) 29.0-29.9, adult: Secondary | ICD-10-CM | POA: Insufficient documentation

## 2014-06-14 DIAGNOSIS — Z713 Dietary counseling and surveillance: Secondary | ICD-10-CM | POA: Diagnosis not present

## 2014-06-14 NOTE — Patient Instructions (Addendum)
*  Focus on non-scale victories: crossing legs comfortably, sitting in a chair comfortably, feeling good in clothes  -Increase carbs on the days you exercise to 45 to 60 grams per day -Ask your husband to lock up the scale and bring it out only on Mondays -Foods to try: beans, avocado, a big yummy salad! (Enjoy every bite!) -Goal: eat real food at least 2x a day (have at least 4 "meals" per day)

## 2014-06-14 NOTE — Progress Notes (Signed)
  Follow-up visit:  7 months Post-Operative RYGB Surgery  Medical Nutrition Therapy:  Appt start time: 1120 end time:  1200  Primary concerns today: Post-operative Bariatric Surgery Nutrition Management.  Katelyn [name] returns today having lost another 23.5 lbs in the last 2 months. Her BMI is now within the normal range. She reports that she is trying to eat one solid meal per day but is more comfortable drinking protein shakes because she does not "have to think about it." Katelyn [name] reports extreme anxiety over food and fear of weight regain. She is meeting her protein needs with Inspire protein shakes (20g protein). She is afraid to eat carbohydrates like beans and fruit and reports that she may have up to 30 grams of carbs on a "bad day." She weighs herself daily and thinks it may be beneficial to have her husband lock up her scale. She reports that she would like to eat normally.  Surgery date: 10/31/2013 Surgery type: RYGB [facility] weight at [facility]: 265.5 lbs on 08/09/2013 (306 per patient) Weight today: 149 lbs Weight loss: 23.5 lbs Total weight loss: 116.5 lbs (157 lbs)  TANITA  BODY COMP RESULTS  10/17/2013 11/15/13 12/27/13 02/07/14 04/11/14 06/14/14   BMI (kg/m^2) 44 40.3 35.7 32.9 28.7 24.8   Fat Mass (lbs) 140 124.0 101.5 83 65.5 45.5   Fat Free Mass (lbs) 124.5 118.0 116.5 114.5 107 103.5   Total Body Water (lbs) 91 86.5 85.5 84 78.5 76    Preferred Learning Style:   No preference indicated   Learning Readiness:   Ready  24-hr recall: B (AM): inspire protein shake (20g) *3-4 per day Snk (AM):  L (PM): 1/2 cup cottage cheese with 12 almonds Snk:  D (6 PM):protein shake (20g) Snk (PM):  Fluid intake: unable to determine amount; sips all day and able to take in more at a time per patient Estimated total protein intake: 70+g  Medications: see list Supplementation: taking  Using straws: no Drinking while eating: no Hair loss: "a lot of hair loss" (taking Biotin) Carbonated  beverages: no N/V/D/C: constipation resolving Dumping syndrome: no  Recent physical activity:  1 hour of cardio (treadmill and elliptical) 4x a week  Progress Towards Goal(s):  In progress.   Nutritional Diagnosis:  [place]-3.3 Overweight/obesity related to past poor dietary habits and physical inactivity as evidenced by patient w/ recent RYGB surgery following dietary guidelines for continued weight loss.    Intervention:  Nutrition counseling provided. Goals: -Increase carbs on the days you exercise to 45 to 60 grams per day -Ask your husband to lock up the scale and bring it out only on Mondays -Foods to try: beans, avocado, a big yummy salad! (Enjoy every bite!) -Goal: eat real food at least 2x a day (have at least 4 "meals" per day)  Teaching Method Utilized:  Visual Auditory  Barriers to learning/adherence to lifestyle change: none  Demonstrated degree of understanding via:  Teach Back   Monitoring/Evaluation:  Dietary intake, exercise, and body weight. Follow up in 1 months for 8 month post-op visit.

## 2014-06-15 ENCOUNTER — Encounter (HOSPITAL_COMMUNITY): Payer: Self-pay | Admitting: Psychiatry

## 2014-06-15 ENCOUNTER — Other Ambulatory Visit (HOSPITAL_COMMUNITY): Payer: Managed Care, Other (non HMO)

## 2014-06-15 ENCOUNTER — Ambulatory Visit (INDEPENDENT_AMBULATORY_CARE_PROVIDER_SITE_OTHER): Payer: Managed Care, Other (non HMO) | Admitting: Psychiatry

## 2014-06-15 VITALS — BP 125/76 | HR 88 | Ht 65.0 in | Wt 147.8 lb

## 2014-06-15 DIAGNOSIS — F323 Major depressive disorder, single episode, severe with psychotic features: Secondary | ICD-10-CM

## 2014-06-15 DIAGNOSIS — F331 Major depressive disorder, recurrent, moderate: Secondary | ICD-10-CM

## 2014-06-15 MED ORDER — LORAZEPAM 0.5 MG PO TABS: 0.5000 mg | ORAL_TABLET | Freq: Two times a day (BID) | ORAL | Status: DC

## 2014-06-15 MED ORDER — BUPROPION HCL ER (XL) 300 MG PO TB24: 300.0000 mg | ORAL_TABLET | Freq: Every day | ORAL | Status: DC
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
## 2014-06-16 ENCOUNTER — Ambulatory Visit: Payer: Self-pay | Admitting: Psychiatry

## 2014-06-16 ENCOUNTER — Other Ambulatory Visit (HOSPITAL_COMMUNITY): Payer: Managed Care, Other (non HMO)

## 2014-06-16 DIAGNOSIS — F332 Major depressive disorder, recurrent severe without psychotic features: Secondary | ICD-10-CM | POA: Diagnosis not present

## 2014-06-16 DIAGNOSIS — Z01810 Encounter for preprocedural cardiovascular examination: Secondary | ICD-10-CM | POA: Diagnosis not present

## 2014-06-16 DIAGNOSIS — F329 Major depressive disorder, single episode, unspecified: Secondary | ICD-10-CM | POA: Diagnosis not present

## 2014-06-16 DIAGNOSIS — F323 Major depressive disorder, single episode, severe with psychotic features: Secondary | ICD-10-CM | POA: Diagnosis not present

## 2014-06-16 DIAGNOSIS — Z01818 Encounter for other preprocedural examination: Secondary | ICD-10-CM | POA: Diagnosis not present

## 2014-06-16 DIAGNOSIS — Z01812 Encounter for preprocedural laboratory examination: Secondary | ICD-10-CM | POA: Diagnosis not present

## 2014-06-19 ENCOUNTER — Other Ambulatory Visit (HOSPITAL_COMMUNITY): Payer: Managed Care, Other (non HMO)

## 2014-06-19 ENCOUNTER — Ambulatory Visit: Payer: Self-pay | Admitting: Psychiatry

## 2014-06-19 ENCOUNTER — Telehealth (HOSPITAL_COMMUNITY): Payer: Self-pay

## 2014-06-19 DIAGNOSIS — F329 Major depressive disorder, single episode, unspecified: Secondary | ICD-10-CM | POA: Diagnosis not present

## 2014-06-19 NOTE — Telephone Encounter (Signed)
Katelyn [name] will start ECT in [place] on 06/21/14.  dlo

## 2014-06-20 DIAGNOSIS — F333 Major depressive disorder, recurrent, severe with psychotic symptoms: Secondary | ICD-10-CM | POA: Diagnosis not present

## 2014-06-20 DIAGNOSIS — F411 Generalized anxiety disorder: Secondary | ICD-10-CM | POA: Diagnosis not present

## 2014-06-22 ENCOUNTER — Ambulatory Visit (HOSPITAL_COMMUNITY): Payer: Self-pay | Admitting: Psychiatry

## 2014-06-22 DIAGNOSIS — E282 Polycystic ovarian syndrome: Secondary | ICD-10-CM | POA: Diagnosis not present

## 2014-06-22 DIAGNOSIS — E119 Type 2 diabetes mellitus without complications: Secondary | ICD-10-CM | POA: Diagnosis not present

## 2014-06-22 DIAGNOSIS — N643 Galactorrhea not associated with childbirth: Secondary | ICD-10-CM | POA: Diagnosis not present

## 2014-06-26 DIAGNOSIS — F329 Major depressive disorder, single episode, unspecified: Secondary | ICD-10-CM | POA: Diagnosis not present

## 2014-06-26 DIAGNOSIS — F332 Major depressive disorder, recurrent severe without psychotic features: Secondary | ICD-10-CM | POA: Diagnosis not present

## 2014-06-26 DIAGNOSIS — F339 Major depressive disorder, recurrent, unspecified: Secondary | ICD-10-CM | POA: Diagnosis not present

## 2014-06-27 ENCOUNTER — Ambulatory Visit
Admit: 2014-06-27 | Disposition: A | Discharge status: Home / Self Care | Payer: Self-pay | Attending: Psychiatry | Admitting: Psychiatry

## 2014-06-27 DIAGNOSIS — E282 Polycystic ovarian syndrome: Secondary | ICD-10-CM | POA: Diagnosis not present

## 2014-06-27 DIAGNOSIS — N643 Galactorrhea not associated with childbirth: Secondary | ICD-10-CM | POA: Diagnosis not present

## 2014-06-27 DIAGNOSIS — E6609 Other obesity due to excess calories: Secondary | ICD-10-CM | POA: Diagnosis not present

## 2014-06-27 DIAGNOSIS — F329 Major depressive disorder, single episode, unspecified: Secondary | ICD-10-CM | POA: Diagnosis not present

## 2014-06-27 DIAGNOSIS — E119 Type 2 diabetes mellitus without complications: Secondary | ICD-10-CM | POA: Diagnosis not present

## 2014-06-28 DIAGNOSIS — F339 Major depressive disorder, recurrent, unspecified: Secondary | ICD-10-CM | POA: Diagnosis not present

## 2014-06-28 DIAGNOSIS — F329 Major depressive disorder, single episode, unspecified: Secondary | ICD-10-CM | POA: Diagnosis not present

## 2014-06-28 DIAGNOSIS — F332 Major depressive disorder, recurrent severe without psychotic features: Secondary | ICD-10-CM | POA: Diagnosis not present

## 2014-06-29 ENCOUNTER — Encounter (HOSPITAL_COMMUNITY): Payer: Self-pay | Admitting: Psychiatry

## 2014-06-29 ENCOUNTER — Ambulatory Visit (INDEPENDENT_AMBULATORY_CARE_PROVIDER_SITE_OTHER): Payer: Managed Care, Other (non HMO) | Admitting: Psychiatry

## 2014-06-29 VITALS — BP 100/62 | HR 69 | Ht 65.0 in | Wt 144.8 lb

## 2014-06-29 DIAGNOSIS — F331 Major depressive disorder, recurrent, moderate: Secondary | ICD-10-CM | POA: Diagnosis not present

## 2014-06-29 MED ORDER — BUPROPION HCL 100 MG PO TABS: 100.0000 mg | ORAL_TABLET | Freq: Every day | ORAL | Status: DC

## 2014-06-29 MED ORDER — CLONAZEPAM 0.5 MG PO TABS: 0.5000 mg | ORAL_TABLET | Freq: Two times a day (BID) | ORAL | Status: DC

## 2014-06-29 MED ORDER — FLUOXETINE HCL 40 MG PO CAPS: 40.0000 mg | ORAL_CAPSULE | Freq: Every day | ORAL | Status: DC
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
## 2014-06-30 DIAGNOSIS — F332 Major depressive disorder, recurrent severe without psychotic features: Secondary | ICD-10-CM | POA: Diagnosis not present

## 2014-06-30 DIAGNOSIS — F339 Major depressive disorder, recurrent, unspecified: Secondary | ICD-10-CM | POA: Diagnosis not present

## 2014-06-30 DIAGNOSIS — F329 Major depressive disorder, single episode, unspecified: Secondary | ICD-10-CM | POA: Diagnosis not present

## 2014-07-04 DIAGNOSIS — F411 Generalized anxiety disorder: Secondary | ICD-10-CM | POA: Diagnosis not present

## 2014-07-04 DIAGNOSIS — F333 Major depressive disorder, recurrent, severe with psychotic symptoms: Secondary | ICD-10-CM | POA: Diagnosis not present

## 2014-07-10 DIAGNOSIS — F339 Major depressive disorder, recurrent, unspecified: Secondary | ICD-10-CM | POA: Diagnosis not present

## 2014-07-10 DIAGNOSIS — F329 Major depressive disorder, single episode, unspecified: Secondary | ICD-10-CM | POA: Diagnosis not present

## 2014-07-10 DIAGNOSIS — F332 Major depressive disorder, recurrent severe without psychotic features: Secondary | ICD-10-CM | POA: Diagnosis not present

## 2014-07-12 ENCOUNTER — Encounter: Payer: Managed Care, Other (non HMO) | Attending: General Surgery | Admitting: Dietician

## 2014-07-12 VITALS — Wt 142.0 lb

## 2014-07-12 DIAGNOSIS — Z713 Dietary counseling and surveillance: Secondary | ICD-10-CM | POA: Insufficient documentation

## 2014-07-12 DIAGNOSIS — Z6829 Body mass index (BMI) 29.0-29.9, adult: Secondary | ICD-10-CM | POA: Insufficient documentation

## 2014-07-12 DIAGNOSIS — E669 Obesity, unspecified: Secondary | ICD-10-CM

## 2014-07-12 NOTE — Progress Notes (Signed)
  Follow-up visit:  8 months Post-Operative RYGB Surgery  Medical Nutrition Therapy:  Appt start time: 945 end time:  1015  Primary concerns today: Post-operative Bariatric Surgery Nutrition Management.  Weronika returns today having lost another 7 pounds. She has been having Electric Shock Therapy. She states that her relationship with food is improving and she is feeling less anxiety about trying new foods. She has begun to eat solid foods more frequently but still has fears about some foods, especially carbohydrates. Reighn reports that she is ready to stop losing weight but would not like to gain any weight.   Surgery date: 10/31/2013 Surgery type: RYGB Start weight at [facility]: 265.5 lbs on 08/09/2013 (306 per patient) Weight today: 142 lbs Weight loss: 7 lbs Total weight loss: 164 lbs  TANITA  BODY COMP RESULTS  10/17/2013 11/15/13 12/27/13 02/07/14 04/11/14 06/14/14 07/12/14   BMI (kg/m^2) 44 40.3 35.7 32.9 28.7 24.8 23.6   Fat Mass (lbs) 140 124.0 101.5 83 65.5 45.5 36   Fat Free Mass (lbs) 124.5 118.0 116.5 114.5 107 103.5 106   Total Body Water (lbs) 91 86.5 85.5 84 78.5 76 77.5    Preferred Learning Style:   No preference indicated   Learning Readiness:   Ready  24-hr recall: B (AM): inspire protein shake (20g)  Snk (AM): sugar free jello L (PM): 2 oz meat + vegetable with [place] yogurt Ranch dip (14g) Snk: egg drop soup D (6 PM): Dannon Light and Fit Greek yogurt with 2Tbsp PB2 (12g) Snk (PM):  Fluid intake: "close" to 64 ounces Estimated total protein intake: 52 g protein yesterday  Medications: see list Supplementation: taking  Using straws: no Drinking while eating: no Hair loss: "a lot of hair loss" (taking Biotin) Carbonated beverages: no N/V/D/C: some diarrhea Dumping syndrome: no  Recent physical activity:  Less exercise (1 hour about 2x a week)  Progress Towards Goal(s):  In progress.   Nutritional Diagnosis:  [place]-3.3 Overweight/obesity related to past poor  dietary habits and physical inactivity as evidenced by patient w/ recent RYGB surgery following dietary guidelines for continued weight loss.    Intervention:  Nutrition counseling provided. Goals: -Add something with protein to snacks: cottage cheese, egg, cheese, nuts, deli meat, yogurt -Have something to eat about every 3-4 hours -Continue to have 4 small meals per day -Work on listening to hunger cues -Try strawberries!   Teaching Method Utilized:  Visual Auditory  Barriers to learning/adherence to lifestyle change: none  Demonstrated degree of understanding via:  Teach Back   Monitoring/Evaluation:  Dietary intake, exercise, and body weight. Follow up in 2 months for 10 month post-op visit.

## 2014-07-12 NOTE — Patient Instructions (Addendum)
*  Focus on non-scale victories: crossing legs comfortably, sitting in a chair comfortably, feeling good in clothes   -Add something with protein to snacks: cottage cheese, egg, cheese, nuts, deli meat, yogurt -Have something to eat about every 3-4 hours -Continue to have 4 small meals per day -Work on listening to hunger cues -Try strawberries!   TANITA  BODY COMP RESULTS  10/17/2013 11/15/13 12/27/13 02/07/14 04/11/14 06/14/14 07/12/14   BMI (kg/m^2) 44 40.3 35.7 32.9 28.7 24.8 23.6   Fat Mass (lbs) 140 124.0 101.5 83 65.5 45.5 36   Fat Free Mass (lbs) 124.5 118.0 116.5 114.5 107 103.5 106   Total Body Water (lbs) 91 86.5 85.5 84 78.5 76 77.5

## 2014-07-17 DIAGNOSIS — F332 Major depressive disorder, recurrent severe without psychotic features: Secondary | ICD-10-CM | POA: Diagnosis not present

## 2014-07-17 DIAGNOSIS — F339 Major depressive disorder, recurrent, unspecified: Secondary | ICD-10-CM | POA: Diagnosis not present

## 2014-07-17 DIAGNOSIS — F329 Major depressive disorder, single episode, unspecified: Secondary | ICD-10-CM | POA: Diagnosis not present

## 2014-07-18 DIAGNOSIS — F333 Major depressive disorder, recurrent, severe with psychotic symptoms: Secondary | ICD-10-CM | POA: Diagnosis not present

## 2014-07-18 DIAGNOSIS — F411 Generalized anxiety disorder: Secondary | ICD-10-CM | POA: Diagnosis not present

## 2014-07-28 ENCOUNTER — Ambulatory Visit
Admit: 2014-07-28 | Disposition: A | Discharge status: Home / Self Care | Payer: Self-pay | Attending: Psychiatry | Admitting: Psychiatry

## 2014-07-28 DIAGNOSIS — F329 Major depressive disorder, single episode, unspecified: Secondary | ICD-10-CM | POA: Diagnosis not present

## 2014-07-31 ENCOUNTER — Ambulatory Visit (HOSPITAL_COMMUNITY): Payer: Self-pay | Admitting: Psychiatry

## 2014-07-31 DIAGNOSIS — F339 Major depressive disorder, recurrent, unspecified: Secondary | ICD-10-CM | POA: Diagnosis not present

## 2014-07-31 DIAGNOSIS — F332 Major depressive disorder, recurrent severe without psychotic features: Secondary | ICD-10-CM | POA: Diagnosis not present

## 2014-07-31 DIAGNOSIS — F329 Major depressive disorder, single episode, unspecified: Secondary | ICD-10-CM | POA: Diagnosis not present

## 2014-08-01 DIAGNOSIS — F333 Major depressive disorder, recurrent, severe with psychotic symptoms: Secondary | ICD-10-CM | POA: Diagnosis not present

## 2014-08-01 DIAGNOSIS — F411 Generalized anxiety disorder: Secondary | ICD-10-CM | POA: Diagnosis not present

## 2014-08-02 ENCOUNTER — Ambulatory Visit (INDEPENDENT_AMBULATORY_CARE_PROVIDER_SITE_OTHER): Payer: Managed Care, Other (non HMO) | Admitting: Neurology

## 2014-08-02 ENCOUNTER — Encounter: Payer: Self-pay | Admitting: Neurology

## 2014-08-02 VITALS — BP 110/64 | HR 64 | Ht 65.0 in | Wt 136.3 lb

## 2014-08-02 DIAGNOSIS — G43009 Migraine without aura, not intractable, without status migrainosus: Secondary | ICD-10-CM

## 2014-08-02 DIAGNOSIS — R51 Headache: Secondary | ICD-10-CM | POA: Diagnosis not present

## 2014-08-02 DIAGNOSIS — R519 Headache, unspecified: Secondary | ICD-10-CM

## 2014-08-02 MED ORDER — ONDANSETRON HCL 4 MG PO TABS: 4.0000 mg | ORAL_TABLET | Freq: Three times a day (TID) | ORAL | Status: DC | PRN

## 2014-08-02 MED ORDER — TOPIRAMATE 50 MG PO TABS: ORAL_TABLET | ORAL | Status: DC

## 2014-08-02 MED ORDER — CYCLOBENZAPRINE HCL 5 MG PO TABS: 5.0000 mg | ORAL_TABLET | Freq: Three times a day (TID) | ORAL | Status: DC | PRN

## 2014-08-02 NOTE — Patient Instructions (Addendum)
1.  Increase your topamax 50mg  as follows:   Start taking 1 tab in the morning and 2 tab at bedtime x 2 weeks, then increase to 2 tablets twice daily.    Keep in mind that it may worsen your cognitive problems and we may need to reduce it again  2.  For rescue medication, ok to take zofran 4mg  (nausea) and flexeril 5mg  (muscle relaxants) at migraine onset  3.  Return to clinic as needed

## 2014-08-02 NOTE — Progress Notes (Signed)
Follow-up Visit   Date: 08/02/2014    Katelyn [name] MRN: [id number] DOB: [birth year]   Interim History: Katelyn [name] is a 39 y.o. right-handed Caucasian female with history of history of diabetes, polycystic ovarian cystic syndrome, depression with psychosis, and mild OSA (not on CPAP) returning to the clinic for follow-up of chronic daily headaches.  The patient was accompanied to the clinic by her son.  History of present illness: Headaches started around 2012 after her ECT therapy (total of 12 sessions). She has no prior history of migraines or severe headaches prior to this. Headaches are described as "tightness, pressure" over the forehead. She has occasional radiation of pain to the back of her head. Headache is daily (ranked as 3/10), with varying intensity throughout the day. It is worsened by loud noises and stress. When severe, she has photophobia, nausea, and vomiting. Duration is 5-6 hours. Severe headaches occur once every 1-2 months. She is able to function with daily headaches, but with severe pain, she has to lay down. She has tried hot and cold compresses, ibuprofen, excedrin migraine, tylenol, and Aleve which does not help. She does not take anything for them anymore because nothing works. She was recently started on fioricet which helped when she took two.  She reports snoring loudly and feeling tired in the morning. She does not get restful sleep. Restoril was started for insomnia which helped her get 6 hours of uninterrupted sleep, but this is started to wear off.   Her psychiatrist also tried topamax 50mg  but there was no change in symptoms. Of all the things she has tried, she has felt the most relief with hot/cold compresses.  Of note, she takes ziprasidone 40 MG for depression with psychosis for a number of years and has generalized tremors.   - Follow-up 06/30/2013:  She reports having significant improvement after increasing topamax to 100mg /day, especially with  migraines.   She continues to have low-grade daily headache, but this also improved because it is tolerable now.  She reports having psychiatric hospitalization January 2015 for suicidal ideation and impulsive thoughts.     - Follow 09/30/2013:  She reports having migraines about once per month and takes naproxen.  She is having headache-free days, occuring about once per week. Otherwise, she still has low-grade dull headache.  Denies any side effects to topamax or plans for pregnancy.  She is approved for gastric bypass which potentially scheduled for early July.  She is very happy that headaches are better because she is less sedentary and is able to engage with her children.    - UPDATE 04/03/2014:  She underwent gastric bypass in July 2015 and has lost an incredible 88lb and continues to loose weight!  She is staying active and exercising also.  She reports migraines are better occuring once every 1-2 months.  She had no longer has all-day headache and she has 2-3 headache-free days per week.  She started taking magnesium supplements and noticed that it helped also.  Most days her headaches are 5/10 and reports that it is tolerable.    - UPDATE 08/02/2014:  Patient scheduled sooner f/u appointment due to worsening headaches since starting ECT in February which is helping her depression, but has noticed cognitive side effects and worsening of headaches. Her headaches are now daily and chronic in addition to migraines several times per week. Bifrontal headaches and throbbing.  She is unable to take NSAIDs due to gastric bypass, so has been treated with  tylenol but there is no relief.  She continues to loose an astonishing 40+lb since her last visit!   Medications:  Current Outpatient Prescriptions on File Prior to Visit  Medication Sig Dispense Refill  . buPROPion (WELLBUTRIN) 100 MG tablet Take 1 tablet (100 mg total) by mouth daily. 30 tablet 0  . clonazePAM (KLONOPIN) 0.5 MG tablet Take 1 tablet (0.5 mg  total) by mouth 2 (two) times daily. 60 tablet 0  . FLUoxetine (PROZAC) 40 MG capsule Take 1 capsule (40 mg total) by mouth daily. 90 capsule 0  . magnesium oxide (MAG-OX) 400 MG tablet Take 400 mg by mouth daily.    . Multiple Vitamin (MULTIVITAMIN WITH MINERALS) TABS tablet Take 1 tablet by mouth every morning.    . ziprasidone (GEODON) 80 MG capsule Take 2 capsule at bed time 180 capsule 0   No current facility-administered medications on file prior to visit.    Allergies:  Allergies  Allergen Reactions  . Lithium     Catatonic state  . Other     Itching with epidural for c-section - pt thinks morphine was being given  . Penicillins     REACTION: Rash     Review of Systems:  CONSTITUTIONAL: No fevers, chills, night sweats,++ weight loss.   EYES: No visual changes or eye pain ENT: No hearing changes.  No history of nose bleeds.   RESPIRATORY: No cough, wheezing and shortness of breath.   CARDIOVASCULAR: Negative for chest pain, and palpitations.   GI: Negative for abdominal discomfort, blood in stools or black stools.  No recent change in bowel habits.   GU:  No history of incontinence.   MUSCLOSKELETAL: No history of joint pain or swelling.  No myalgias.   SKIN: Negative for lesions, rash, and itching.   ENDOCRINE: Negative for cold or heat intolerance, polydipsia or goiter.   PSYCH:  + depression or anxiety symptoms.   NEURO: As Above.   Vital Signs:  BP 110/64 mmHg  Pulse 64  Ht 5\' 5"  (1.651 m)  Wt 136 lb 5 oz (61.831 kg)  BMI 22.68 kg/m2  SpO2 95%  Neurological Exam: MENTAL STATUS including orientation to time, place, person, recent and remote memory, attention span and concentration, language, and fund of knowledge is normal.  Speech is not dysarthric. Blunted affect, but smiles appropriate.  Reduced eye blink.  CRANIAL NERVES:  Normal conjugate, extra-ocular eye movements in all directions of gaze.  No ptosis. Face is symmetric. Palate elevates symmetrically.   Tongue is midline and tremulous.  Occasional involuntary movements of the face is seen.  MOTOR:  Motor strength is 5/5 in all extremities.  COORDINATION/GAIT: Gait narrow based and stable.   Data: Lab Results  Component Value Date   [id number] 244 05/13/2014   Lab Results  Component Value Date   TSH 1.474 05/13/2014   MRI brain 12/2010: Normal examination. No pituitary macroadenoma. No diagnosable microadenoma.    IMPRESSION/PLAN: 1.  Chronic daily headaches, worsening since starting ECT  - now she has daily headaches again  - increase topamax to 100mg  BID (titration schedule given)  - warned her that cognitive side effects may worsen as we titrate TPM and will need to watch for this.    - Continue taking magnesium oxide 400-600mg  daily  2.  Migraine without aura, worsening  - Rx given for zofran 4mg  and flexeril 5mg  prn for migraine  - Consider triptan going forward  - Unable to take NSAIDs due to gastric bypass  3.  Depression with psychosis, followed by Dr. Adele Schilder  - undergoing ECT  4.  S/p gastric bypass with 120+ weight loss!  Encouraged her to maintain current weight and avoid further weight loss  5.  Return to clinic as needed  The duration of this appointment visit was 30 minutes of face-to-face time with the patient.  Greater than 50% of this time was spent in counseling, explanation of diagnosis, planning of further management, and coordination of care.   Thank you for allowing me to participate in patient's care.  If I can answer any additional questions, I would be pleased to do so.    Sincerely,    Marquetta Weiskopf K. Posey Pronto, DO

## 2014-08-03 ENCOUNTER — Ambulatory Visit (INDEPENDENT_AMBULATORY_CARE_PROVIDER_SITE_OTHER): Payer: Managed Care, Other (non HMO) | Admitting: Psychiatry

## 2014-08-03 ENCOUNTER — Encounter (HOSPITAL_COMMUNITY): Payer: Self-pay | Admitting: Psychiatry

## 2014-08-03 VITALS — BP 124/83 | HR 75 | Ht 65.0 in | Wt 136.0 lb

## 2014-08-03 DIAGNOSIS — F331 Major depressive disorder, recurrent, moderate: Secondary | ICD-10-CM

## 2014-08-03 DIAGNOSIS — F323 Major depressive disorder, single episode, severe with psychotic features: Secondary | ICD-10-CM

## 2014-08-03 MED ORDER — BUPROPION HCL 100 MG PO TABS: 100.0000 mg | ORAL_TABLET | Freq: Every day | ORAL | Status: DC
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
## 2014-08-10 ENCOUNTER — Other Ambulatory Visit (HOSPITAL_COMMUNITY): Payer: Self-pay | Admitting: Psychiatry

## 2014-08-10 NOTE — Telephone Encounter (Signed)
Tried to contact patient, had to leave message on machine.

## 2014-08-16 DIAGNOSIS — F411 Generalized anxiety disorder: Secondary | ICD-10-CM | POA: Diagnosis not present

## 2014-08-16 DIAGNOSIS — F333 Major depressive disorder, recurrent, severe with psychotic symptoms: Secondary | ICD-10-CM | POA: Diagnosis not present

## 2014-08-26 ENCOUNTER — Other Ambulatory Visit: Payer: Self-pay

## 2014-08-26 MED ORDER — SUCCINYLCHOLINE CHLORIDE 20 MG/ML IJ SOLN: 70.0000 mg | Freq: Once | INTRAMUSCULAR | Status: DC

## 2014-08-28 ENCOUNTER — Encounter: Payer: Self-pay | Admitting: Anesthesiology

## 2014-08-28 ENCOUNTER — Ambulatory Visit
Admission: RE | Admit: 2014-08-28 | Discharge: 2014-08-28 | Disposition: A | Discharge status: Home / Self Care | Payer: Managed Care, Other (non HMO) | Source: Ambulatory Visit | Attending: Psychiatry | Admitting: Psychiatry

## 2014-08-28 ENCOUNTER — Encounter: Payer: Medicare Other | Admitting: Anesthesiology

## 2014-08-28 DIAGNOSIS — F329 Major depressive disorder, single episode, unspecified: Secondary | ICD-10-CM | POA: Insufficient documentation

## 2014-08-28 DIAGNOSIS — F339 Major depressive disorder, recurrent, unspecified: Secondary | ICD-10-CM | POA: Diagnosis not present

## 2014-08-28 DIAGNOSIS — F332 Major depressive disorder, recurrent severe without psychotic features: Secondary | ICD-10-CM | POA: Diagnosis not present

## 2014-08-28 HISTORY — DX: Headache: R51

## 2014-08-28 HISTORY — DX: Headache, unspecified: R51.9

## 2014-08-28 HISTORY — PX: OTHER SURGICAL HISTORY: SHX169

## 2014-08-28 HISTORY — PX: ROUX-EN-Y PROCEDURE: SUR1287

## 2014-08-28 MED ORDER — LIDOCAINE HCL 2 % IJ SOLN
0.2000 mL | Freq: Once | INTRAMUSCULAR | Status: AC
  Administered 2014-08-28: 4 mg

## 2014-08-28 MED ORDER — SUCCINYLCHOLINE CHLORIDE 20 MG/ML IJ SOLN
70.0000 mg | Freq: Once | INTRAMUSCULAR | Status: AC
  Administered 2014-08-28: 70 mg via INTRAVENOUS

## 2014-08-28 MED ORDER — SODIUM CHLORIDE 0.9 % IV SOLN
250.0000 mL | Freq: Once | INTRAVENOUS | Status: AC
  Administered 2014-08-28: 250 mL via INTRAVENOUS

## 2014-08-28 MED ORDER — METHOHEXITAL SODIUM 0.5 G IJ SOLR
70.0000 mg | Freq: Once | INTRAMUSCULAR | Status: AC
  Administered 2014-08-28: 70 mg via INTRAVENOUS

## 2014-08-28 NOTE — Anesthesia Preprocedure Evaluation (Signed)
Anesthesia Evaluation   Patient awake    Reviewed: Allergy & Precautions, NPO status , Patient's Chart, lab work & pertinent test results  History of Anesthesia Complications Negative for: history of anesthetic complications  Airway Mallampati: II  TM Distance: >3 FB Neck ROM: Full    Dental no notable dental hx.    Pulmonary neg pulmonary ROS, former smoker,    Pulmonary exam normal       Cardiovascular negative cardio ROS      Neuro/Psych  Headaches, PSYCHIATRIC DISORDERS Anxiety Depression    GI/Hepatic negative GI ROS, Neg liver ROS,   Endo/Other  negative endocrine ROS  Renal/GU negative Renal ROS     Musculoskeletal   Abdominal   Peds  Hematology negative hematology ROS (+)   Anesthesia Other Findings   Reproductive/Obstetrics negative OB ROS                             Anesthesia Physical Anesthesia Plan  ASA: II  Anesthesia Plan: General   Post-op Pain Management:    Induction: Intravenous  Airway Management Planned: Mask  Additional Equipment:   Intra-op Plan:   Post-operative Plan:   Informed Consent: I have reviewed the patients History and Physical, chart, labs and discussed the procedure including the risks, benefits and alternatives for the proposed anesthesia with the patient or authorized representative who has indicated his/her understanding and acceptance.   Dental advisory given  Plan Discussed with: CRNA and Surgeon  Anesthesia Plan Comments:         Anesthesia Quick Evaluation

## 2014-08-28 NOTE — Anesthesia Procedure Notes (Signed)
Date/Time: 08/28/2014 11:36 AM Performed by: Eliberto Ivory Pre-anesthesia Checklist: Patient identified, Emergency Drugs available, Timeout performed, Suction available and Patient being monitored Patient Re-evaluated:Patient Re-evaluated prior to inductionOxygen Delivery Method: Circle system utilized Preoxygenation: Pre-oxygenation with 100% oxygen Ventilation: Oral airway inserted - appropriate to patient size and Mask ventilation without difficulty Grade View: Grade II

## 2014-08-28 NOTE — Transfer of Care (Signed)
Immediate Anesthesia Transfer of Care Note  Patient: Katelyn [name]  Procedure(s) Performed: ECT  Patient Location: PACU  Anesthesia Type:General  Level of Consciousness: awake, alert  and oriented  Airway & Oxygen Therapy: Patient Spontanous Breathing and Patient connected to face mask oxygen  Post-op Assessment: Report given to RN and Post -op Vital signs reviewed and stable  Post vital signs: Reviewed and stable  Last Vitals:  Filed Vitals:   08/28/14 1150  BP: 109/66  Pulse: 82  Temp:   Resp: 12    Complications: No apparent anesthesia complications

## 2014-08-28 NOTE — Anesthesia Postprocedure Evaluation (Signed)
  Anesthesia Post-op Note  Patient: Katelyn [name]  Procedure(s) Performed: ECT  Anesthesia type:General  Patient location: PACU  Post pain: Pain level controlled  Post assessment: Post-op Vital signs reviewed, Patient's Cardiovascular Status Stable, Respiratory Function Stable, Patent Airway and No signs of Nausea or vomiting  Post vital signs: Reviewed and stable  Last Vitals:  Filed Vitals:   08/28/14 1150  BP: 109/66  Pulse: 82  Temp: 37.9 C  Resp: 12    Level of consciousness: awake, alert  and patient cooperative  Complications: No apparent anesthesia complications

## 2014-08-29 DIAGNOSIS — F332 Major depressive disorder, recurrent severe without psychotic features: Secondary | ICD-10-CM | POA: Insufficient documentation

## 2014-08-29 DIAGNOSIS — N898 Other specified noninflammatory disorders of vagina: Secondary | ICD-10-CM | POA: Diagnosis not present

## 2014-08-29 DIAGNOSIS — Z01419 Encounter for gynecological examination (general) (routine) without abnormal findings: Secondary | ICD-10-CM | POA: Diagnosis not present

## 2014-08-29 DIAGNOSIS — Z1151 Encounter for screening for human papillomavirus (HPV): Secondary | ICD-10-CM | POA: Diagnosis not present

## 2014-08-30 DIAGNOSIS — F411 Generalized anxiety disorder: Secondary | ICD-10-CM | POA: Diagnosis not present

## 2014-08-30 DIAGNOSIS — F333 Major depressive disorder, recurrent, severe with psychotic symptoms: Secondary | ICD-10-CM | POA: Diagnosis not present

## 2014-09-01 LAB — POCT PREGNANCY, URINE: Preg Test, Ur: NEGATIVE

## 2014-09-04 ENCOUNTER — Ambulatory Visit (INDEPENDENT_AMBULATORY_CARE_PROVIDER_SITE_OTHER): Payer: Managed Care, Other (non HMO) | Admitting: Psychiatry

## 2014-09-04 ENCOUNTER — Encounter (HOSPITAL_COMMUNITY): Payer: Self-pay | Admitting: Psychiatry

## 2014-09-04 VITALS — BP 113/76 | HR 71 | Ht 66.0 in | Wt 128.8 lb

## 2014-09-04 DIAGNOSIS — F411 Generalized anxiety disorder: Secondary | ICD-10-CM | POA: Diagnosis not present

## 2014-09-04 DIAGNOSIS — F333 Major depressive disorder, recurrent, severe with psychotic symptoms: Secondary | ICD-10-CM

## 2014-09-04 MED ORDER — CLONAZEPAM 0.5 MG PO TABS: 0.5000 mg | ORAL_TABLET | Freq: Two times a day (BID) | ORAL | Status: DC

## 2014-09-04 NOTE — Progress Notes (Signed)
[name] [phone] Progress Note  Katelyn [name] [id number] 39 y.o.  09/04/2014 10:43 AM  Chief Complaint:  I am more anxious.  I start taking Klonopin again.                 History of Present Illness:  Katelyn [name] came for her appointment.  She reported increased anxiety and nervousness and she stopped taking again Klonopin 0.5 mg twice a day.  She is getting ECT every 3-4 weeks as a maintenance because she has noticed stopping ECT causing more depression and hopeless feeling.  She mentioned her parents are out of the country for vacation but she also noticed that her husband is very supportive and helping.  She has lost a lot of weight and in 4 weeks she lost another 10-12 pounds .  She is taking Topamax for the headaches.  She has noticed some time poor attention and poor concentration and some time forgetful.  She recently consulted nutritionist and trying to stabilize her weight.  She is compliant with Wellbutrin, Geodon, Prozac and now Klonopin.  She has no tremors or shakes.  Her paranoia is under control.  She is not involved in any binging .  She denies any irritability but sometimes get frustrated.  Her ADLs are okay.  She is seeing Katelyn [name] every week for counseling.  Patient denies drinking or using any illegal substances.  Her next appointment with neurologist is in December 2016.  Suicidal Ideation: No Plan Formed: No Patient has means to carry out plan: No  Homicidal Ideation: No Plan Formed: No Patient has means to carry out plan: No  Review of Systems  Constitutional: Positive for weight loss and malaise/fatigue.  Cardiovascular: Negative for chest pain.  Gastrointestinal: Negative for constipation.  Skin: Negative for itching and rash.  Neurological: Positive for headaches. Negative for dizziness, tingling and tremors.  Psychiatric/Behavioral: Negative for depression, suicidal ideas, hallucinations and substance abuse. The patient is nervous/anxious. The patient does  not have insomnia.        Memory impairment    Psychiatric: Agitation: No Hallucination: Denies.   Depressed Mood: no Insomnia: No Hypersomnia: No Altered Concentration: No Feels Worthless: No Grandiose Ideas: No Belief In Special Powers: No New/Increased Substance Abuse: No Compulsions: No  Neurologic: Headache: Yes Seizure: No Paresthesias: No  Psychosocial history Patient lives with her husband and children.  Her parents are very supportive who live close by.    Medical history Patient has history of polycystic ovary disease, obesity, headache and chronic pain.  Patient has gastric bypass surgery.  Her primary care physician is Dr. [name].  Outpatient Encounter Prescriptions as of 09/04/2014  Medication Sig  . buPROPion (WELLBUTRIN) 100 MG tablet Take 1 tablet (100 mg total) by mouth daily.  . clonazePAM (KLONOPIN) 0.5 MG tablet Take 1 tablet (0.5 mg total) by mouth 2 (two) times daily.  . cyclobenzaprine (FLEXERIL) 5 MG tablet Take 1 tablet (5 mg total) by mouth every 8 (eight) hours as needed for muscle spasms.  Marland Kitchen FLUoxetine (PROZAC) 40 MG capsule Take 1 capsule (40 mg total) by mouth daily.  . magnesium oxide (MAG-OX) 400 MG tablet Take 400 mg by mouth daily.  . Multiple Vitamin (MULTIVITAMIN WITH MINERALS) TABS tablet Take 1 tablet by mouth every morning.  . ondansetron (ZOFRAN) 4 MG tablet Take 1 tablet (4 mg total) by mouth every 8 (eight) hours as needed for nausea or vomiting.  . topiramate (TOPAMAX) 50 MG tablet Start taking 1 tab in  the morning and 2 tab at bedtime x 2 weeks, then increase to 2 tablets twice daily.  . ziprasidone (GEODON) 80 MG capsule TAKE 2 CAPSULES AT BEDTIME  . [DISCONTINUED] clonazePAM (KLONOPIN) 0.5 MG tablet Take 1 tablet (0.5 mg total) by mouth 2 (two) times daily.   No facility-administered encounter medications on file as of 09/04/2014.   No results found for this or any previous visit (from the past 2160 hour(s)). Past  Psychiatric History/Hospitalization(s): Patient has at least 8 psychiatric inpatient treatment.  Her last admission was in February 2015 .  In the past she had tried good response with ECT however she scared to try ECT.  In the past she had tried Cymbalta, Lexapro, Abilify, lithium, Wellbutrin, Lamictal, Ritalin, Risperdal, Neurontin, Vistaril , BuSpar and Valium.     Anxiety: Yes Bipolar Disorder: No Depression: Yes Mania: No Psychosis: Yes Schizophrenia: No Personality Disorder: No Hospitalization for psychiatric illness: Yes History of Electroconvulsive Shock Therapy: Yes Prior Suicide Attempts: No  Physical Exam: Constitutional:  BP 113/76 mmHg  Pulse 71  Ht 5\' 6"  (1.676 m)  Wt 128 lb 12.8 oz (58.423 kg)  BMI 20.80 kg/m2  General Appearance: well nourished  Musculoskeletal: Strength & Muscle Tone: within normal limits Gait & Station: normal Patient leans: N/A  Mental status examination Patient is casually dressed and groomed.  She appears thin and had lost weight from the past.  She is anxious but cooperative.  Her speech is soft clear and coherent.  She maintained good eye contact.  She described her mood anxious and nervous and her affect is constricted.  Her attention and concentration is fair.  Her psychomotor activity is normal.  She denies any paranoia or any hallucination.  She denies any auditory or visual hallucination.  There were no flight if ideas or any loose association. Her fund of knowledge is adequate.  Her memory is grossly normal.  She is alert and oriented x3.  Her insight judgment and impulse control is okay.  Assessment: Axis I: Maj. depressive disorder with psychotic features, generalized anxiety disorder, eating disorder in complete remission, weight loss and memory impairment  Axis II: Deferred  Axis III: Patient Active Problem List   Diagnosis Date Noted  . Sleep disorder 06/01/2013  . Generalized anxiety disorder 05/30/2013  . Chronic headaches  07/01/2012  . Depression, major, recurrent, severe with psychosis 06/17/2012  . Family hx colonic polyps 03/03/2012  . Eating disorder 12/18/2011  . POLYCYSTIC OVARIAN DISEASE 09/01/2008    Plan: I review her symptoms, history, current medication.  I do believe her attention concentration and she has some memory impairment which could be due to ECT and Topamax.  I strongly encouraged her to see her neurologist if she can take something else for headaches as she is losing weight and noticing memory impairment.  She admitted that she need ECT maintenance which is helping her depression.  I encouraged to restart Klonopin 0.5 mg twice a day which is helping her anxiety.  Patient is going to consult with a nutritionist to help her appetite and reducing weight loss.  I also encouraged if symptoms do not get better then she should consult her surgeon who did gastric bypass surgery.  I will continue Geodon 80 mg 2 tablet at bedtime, Prozac 40 mg daily, Wellbutrin 100 mg daily.  Discussed medication side effects and benefits specially metabolic syndrome, EPS and benzodiazepine may cause further cognitive impairment.  Continue to see Katelyn [name] for counseling.  I will also contact her  neurologist so she can get an early appointment for the management of chronic headaches.  Recommended to call us back if she has any question, concern or if she feels worsening of the symptom. Time spent 25 minutes.  More than 50% of the time spent in psychoeducation, counseling and coordination of care.  Discuss safety plan that anytime having active suicidal thoughts or homicidal thoughts then patient need to call 911 or go to the local emergency room.  Follow-up in 2 months. Patient is taking Topamax from Dr Romelle Starcher.    Hurman Ketelsen T., MD 09/04/2014

## 2014-09-05 NOTE — Progress Notes (Signed)
Left message for patient to call me back.  New appointment is scheduled for June 13 at 7:45.

## 2014-09-06 NOTE — Progress Notes (Signed)
Patient is scheduled for a 1 year follow up on June 13.

## 2014-09-12 ENCOUNTER — Ambulatory Visit: Payer: Self-pay | Admitting: Dietician

## 2014-09-13 DIAGNOSIS — F411 Generalized anxiety disorder: Secondary | ICD-10-CM | POA: Diagnosis not present

## 2014-09-13 DIAGNOSIS — F333 Major depressive disorder, recurrent, severe with psychotic symptoms: Secondary | ICD-10-CM | POA: Diagnosis not present

## 2014-09-22 ENCOUNTER — Other Ambulatory Visit: Payer: Self-pay | Admitting: Psychiatry

## 2014-09-28 DIAGNOSIS — N915 Oligomenorrhea, unspecified: Secondary | ICD-10-CM | POA: Diagnosis not present

## 2014-09-28 DIAGNOSIS — Z3200 Encounter for pregnancy test, result unknown: Secondary | ICD-10-CM | POA: Diagnosis not present

## 2014-10-02 ENCOUNTER — Ambulatory Visit: Payer: Self-pay | Admitting: Neurology

## 2014-10-03 ENCOUNTER — Telehealth (HOSPITAL_COMMUNITY): Payer: Self-pay | Admitting: *Deleted

## 2014-10-03 NOTE — Telephone Encounter (Signed)
Called for prior authorization of ECT treatment. Was told by Abby that it is not required for outpatient but only inpatient ECT. No further action required.

## 2014-10-04 ENCOUNTER — Ambulatory Visit: Payer: Self-pay | Admitting: Neurology

## 2014-10-05 DIAGNOSIS — Z9884 Bariatric surgery status: Secondary | ICD-10-CM | POA: Diagnosis not present

## 2014-10-06 DIAGNOSIS — Z9884 Bariatric surgery status: Secondary | ICD-10-CM | POA: Diagnosis not present

## 2014-10-09 ENCOUNTER — Ambulatory Visit: Payer: Self-pay | Admitting: Neurology

## 2014-10-13 ENCOUNTER — Other Ambulatory Visit (HOSPITAL_COMMUNITY): Payer: Self-pay | Admitting: Psychiatry

## 2014-10-16 ENCOUNTER — Other Ambulatory Visit (HOSPITAL_COMMUNITY): Payer: Self-pay | Admitting: Psychiatry

## 2014-10-16 DIAGNOSIS — F331 Major depressive disorder, recurrent, moderate: Secondary | ICD-10-CM

## 2014-10-23 ENCOUNTER — Other Ambulatory Visit: Payer: Self-pay

## 2014-10-24 ENCOUNTER — Telehealth: Payer: Self-pay | Admitting: *Deleted

## 2014-10-24 NOTE — Telephone Encounter (Signed)
Offered her an earlier appointment since she was in and out of town and had limited times available  She would come

## 2014-10-24 NOTE — Telephone Encounter (Signed)
Pt called stating for the last couple months her butt has been hurting and she noticed recently it is turning red. I offered pt a appt for today pt states she is out of town and needs a appt after 7/6, I gave her our next available 11/15/14 at 2:45. Pt would like to be seen sooner. Please advise pt at [phone]

## 2014-11-08 ENCOUNTER — Ambulatory Visit: Payer: Managed Care, Other (non HMO) | Admitting: Family Medicine

## 2014-11-08 ENCOUNTER — Ambulatory Visit: Payer: Self-pay | Admitting: Neurology

## 2014-11-08 ENCOUNTER — Other Ambulatory Visit (HOSPITAL_COMMUNITY): Payer: Self-pay | Admitting: Psychiatry

## 2014-11-08 DIAGNOSIS — F411 Generalized anxiety disorder: Secondary | ICD-10-CM

## 2014-11-09 NOTE — Telephone Encounter (Signed)
Refill of patient's prescribed Geodon authorized by Dr. Salem Senate for 90 day supply due to medication is mailed from [facility].  Patient to keep appointment on 11/16/14 as no further refills until patient evaluated.  New 90 day order of patient's Geodon e-scribed to patient [facility].

## 2014-11-14 DIAGNOSIS — F411 Generalized anxiety disorder: Secondary | ICD-10-CM | POA: Diagnosis not present

## 2014-11-14 DIAGNOSIS — F333 Major depressive disorder, recurrent, severe with psychotic symptoms: Secondary | ICD-10-CM | POA: Diagnosis not present

## 2014-11-15 ENCOUNTER — Ambulatory Visit: Payer: Managed Care, Other (non HMO) | Admitting: Family Medicine

## 2014-11-16 ENCOUNTER — Ambulatory Visit (INDEPENDENT_AMBULATORY_CARE_PROVIDER_SITE_OTHER): Payer: Managed Care, Other (non HMO) | Admitting: Psychiatry

## 2014-11-16 ENCOUNTER — Other Ambulatory Visit: Payer: Self-pay | Admitting: Physician Assistant

## 2014-11-16 ENCOUNTER — Ambulatory Visit
Admission: RE | Admit: 2014-11-16 | Discharge: 2014-11-16 | Disposition: A | Discharge status: Home / Self Care | Payer: Managed Care, Other (non HMO) | Source: Ambulatory Visit | Attending: Physician Assistant | Admitting: Physician Assistant

## 2014-11-16 ENCOUNTER — Encounter (HOSPITAL_COMMUNITY): Payer: Self-pay | Admitting: Psychiatry

## 2014-11-16 VITALS — BP 110/74 | HR 73 | Ht 65.5 in | Wt 117.0 lb

## 2014-11-16 DIAGNOSIS — R09A2 Foreign body sensation, throat: Secondary | ICD-10-CM

## 2014-11-16 DIAGNOSIS — R413 Other amnesia: Secondary | ICD-10-CM

## 2014-11-16 DIAGNOSIS — R634 Abnormal weight loss: Secondary | ICD-10-CM

## 2014-11-16 DIAGNOSIS — R0989 Other specified symptoms and signs involving the circulatory and respiratory systems: Secondary | ICD-10-CM

## 2014-11-16 DIAGNOSIS — F411 Generalized anxiety disorder: Secondary | ICD-10-CM

## 2014-11-16 DIAGNOSIS — Z980 Intestinal bypass and anastomosis status: Secondary | ICD-10-CM

## 2014-11-16 DIAGNOSIS — F331 Major depressive disorder, recurrent, moderate: Secondary | ICD-10-CM

## 2014-11-16 DIAGNOSIS — Z9884 Bariatric surgery status: Secondary | ICD-10-CM | POA: Diagnosis not present

## 2014-11-16 DIAGNOSIS — F509 Eating disorder, unspecified: Secondary | ICD-10-CM | POA: Diagnosis not present

## 2014-11-16 DIAGNOSIS — F323 Major depressive disorder, single episode, severe with psychotic features: Secondary | ICD-10-CM

## 2014-11-16 DIAGNOSIS — Z903 Acquired absence of stomach [part of]: Secondary | ICD-10-CM

## 2014-11-16 MED ORDER — CLONAZEPAM 0.5 MG PO TABS: 0.5000 mg | ORAL_TABLET | Freq: Three times a day (TID) | ORAL | Status: DC | PRN

## 2014-11-16 MED ORDER — FLUOXETINE HCL 40 MG PO CAPS: 40.0000 mg | ORAL_CAPSULE | Freq: Every day | ORAL | Status: DC

## 2014-11-16 NOTE — Progress Notes (Signed)
[facility] [phone] Progress Note  Katelyn [name] [id number] 39 y.o.  11/16/2014 8:58 AM  Chief Complaint:  I still have a lot of anxiety and nervousness.  I stop ECT treatment.  My depression is okay.                   History of Present Illness:  Katelyn [name] came for her appointment.  She stop ECT treatment because she felt her depression is stable.  She also noticed increased nausea with anesthesia and she does not like that.  She continues to lose weight.  She admitted despite feeling hungry she does not eat because she want to lose weight.  She is afraid that she is slipping into anorexia .  She had not seen Katelyn [name] for 2 months until last week because she was very anxious.  She also scheduled to see [facility] who specialize in eating disorder.  Patient admitted having binge eating but denies any purging.  She denies any hallucination or any suicidal thoughts but admitted she still have paranoia.  She likes Geodon is helping her paranoia and delusions.  She has no tremors or shakes.  She is concerned about her mother who recently diagnosed with breast cancer.  Patient told the news of breast cancer has been disturbing but she is handling better than she thought.  She sleeping 4-6 hours.  She denies any feeling of hopelessness or worthlessness.  She is compliant with Wellbutrin, Geodon, Prozac and Klonopin.  She also takes Topamax from her headache doctor.  She reported Topamax is helping her headaches.  We have discussed stopping the Topamax because of weight loss but patient is very resistant to stop the Topamax.  She has some time poor attention concentration and forgetfulness but does not believe it is getting worse.  She denies any agitation or anger.  Her ADLs are good.  She denies drinking or using any illegal substances.  She scheduled to see her surgeon who did gastric bypass next week for follow-up.  She had blood work done in 4 weeks ago and she do not remember any abnormal  findings other than mild elevation of sodium.  She is scheduled to see Dr. Moshe Cipro for her primary care needs in August.  Her blood pressure is normal however she had lost weight 10 pounds in past 2 months.  Suicidal Ideation: No Plan Formed: No Patient has means to carry out plan: No  Homicidal Ideation: No Plan Formed: No Patient has means to carry out plan: No  Review of Systems  Constitutional: Positive for weight loss.  Cardiovascular: Negative for chest pain.  Gastrointestinal: Positive for nausea. Negative for constipation.  Skin: Negative for itching and rash.  Neurological: Positive for headaches. Negative for dizziness, tingling and tremors.  Psychiatric/Behavioral: Negative for depression, suicidal ideas, hallucinations and substance abuse. The patient is nervous/anxious. The patient does not have insomnia.        Memory impairment    Psychiatric: Agitation: No Hallucination: Denies.   Depressed Mood: no Insomnia: No Hypersomnia: No Altered Concentration: No Feels Worthless: No Grandiose Ideas: No Belief In Special Powers: No New/Increased Substance Abuse: No Compulsions: No  Neurologic: Headache: Yes Seizure: No Paresthesias: No  Psychosocial history Patient lives with her husband and children.  Her parents are very supportive who live close by.    Medical history Patient has history of polycystic ovary disease, obesity, headache and chronic pain.  Patient has gastric bypass surgery.  Her primary care physician is Dr.  [name].  Outpatient Encounter Prescriptions as of 11/16/2014  Medication Sig  . clonazePAM (KLONOPIN) 0.5 MG tablet Take 1 tablet (0.5 mg total) by mouth 3 (three) times daily as needed for anxiety.  . cyclobenzaprine (FLEXERIL) 5 MG tablet Take 1 tablet (5 mg total) by mouth every 8 (eight) hours as needed for muscle spasms.  [facility] FLUoxetine (PROZAC) 40 MG capsule Take 1 capsule (40 mg total) by mouth daily.  . magnesium oxide (MAG-OX) 400  MG tablet Take 400 mg by mouth daily.  . Multiple Vitamin (MULTIVITAMIN WITH MINERALS) TABS tablet Take 1 tablet by mouth every morning.  . ondansetron (ZOFRAN) 4 MG tablet Take 1 tablet (4 mg total) by mouth every 8 (eight) hours as needed for nausea or vomiting.  . topiramate (TOPAMAX) 50 MG tablet Start taking 1 tab in the morning and 2 tab at bedtime x 2 weeks, then increase to 2 tablets twice daily.  . ziprasidone (GEODON) 80 MG capsule TAKE 2 CAPSULES AT BEDTIME  . [DISCONTINUED] buPROPion (WELLBUTRIN) 100 MG tablet Take 1 tablet (100 mg total) by mouth daily.  . [DISCONTINUED] clonazePAM (KLONOPIN) 0.5 MG tablet Take 1 tablet (0.5 mg total) by mouth 2 (two) times daily.  . [DISCONTINUED] FLUoxetine (PROZAC) 40 MG capsule Take 1 capsule (40 mg total) by mouth daily.   No facility-administered encounter medications on file as of 11/16/2014.   No results found for this or any previous visit (from the past 2160 hour(s)). Past Psychiatric History/Hospitalization(s): Patient has at least 8 psychiatric inpatient treatment.  Her last admission was in February 2015 .  In the past she had tried good response with ECT however she scared to try ECT.  In the past she had tried Cymbalta, Lexapro, Abilify, lithium, Wellbutrin, Lamictal, Ritalin, Risperdal, Neurontin, Vistaril , BuSpar and Valium.     Anxiety: Yes Bipolar Disorder: No Depression: Yes Mania: No Psychosis: Yes Schizophrenia: No Personality Disorder: No Hospitalization for psychiatric illness: Yes History of Electroconvulsive Shock Therapy: Yes Prior Suicide Attempts: No  Physical Exam: Constitutional:  BP 110/74 mmHg  Pulse 73  Ht 5' 5.5" (1.664 m)  Wt 117 lb (53.071 kg)  BMI 19.17 kg/m2  General Appearance: well nourished  Musculoskeletal: Strength & Muscle Tone: within normal limits Gait & Station: normal Patient leans: N/A  Mental status examination Patient is casually dressed and groomed.  She appears thin and had  lost weight from the past.  She is anxious but cooperative.  Her speech is soft clear and coherent.  She maintained good eye contact.  She described her mood anxious and nervous and her affect is constricted.  Her attention and concentration is fair.  Her psychomotor activity is normal.  She endorse paranoia but denies any delusions or hallucinations.  There were no flight if ideas or any loose association. Her fund of knowledge is adequate.  Her memory is grossly normal.  She is alert and oriented x3.  Her insight judgment and impulse control is okay.  Assessment: Axis I: Maj. depressive disorder with psychotic features, generalized anxiety disorder, eating disorder, weight loss and memory impairment  Axis II: Deferred  Axis III: Patient Active Problem List   Diagnosis Date Noted  . Sleep disorder 06/01/2013  . Generalized anxiety disorder 05/30/2013  . Chronic headaches 07/01/2012  . Depression, major, recurrent, severe with psychosis 06/17/2012  . Family hx colonic polyps 03/03/2012  . Eating disorder 12/18/2011  . POLYCYSTIC OVARIAN DISEASE 09/01/2008    Plan: I had a long discussion with  the patient about weight loss.  She had to stop ECT treatment because she is afraid of nausea due to anesthesia.  However she promised if started to feel depressed she will consider reducing the ECT treatment.  I will discontinue Wellbutrin since she continued to lose weight.  I would increase Klonopin 0.5 mg 3 times a day to help anxiety and nervousness.  Strongly encouraged to keep appointment with [facility] for eating disorder, Katelyn [name] for counseling and GI doctor for follow-up.  Discuss binge eating as a maladaptive behavior and length.  Patient understands and she is trying to work on her eating habit.  Encouraged to eat and portion and keep her weight stable.  I will continue Prozac 40 mg daily, Geodon 80 mg 2 tablet at bedtime.  Patient has no tremors shakes or any EPS.  One more time I  recommended to reduce or stop Topamax but patient reluctant to do that because she believe it is helping her headaches.  I also discuss that she should get repeat blood work if sodium is slightly high and she is losing weight.  Patient will discuss this with her GI doctor follow-up next week. Recommended to call us back if she has any question, concern or if she feels worsening of the symptom. Time spent 25 minutes.  More than 50% of the time spent in psychoeducation, counseling and coordination of care.  Discuss safety plan that anytime having active suicidal thoughts or homicidal thoughts then patient need to call 911 or go to the local emergency room.  Follow-up in 2 months. Patient is taking Topamax from Dr Romelle Starcher.    Tamikka Pilger T., MD 11/16/2014

## 2014-11-21 DIAGNOSIS — N911 Secondary amenorrhea: Secondary | ICD-10-CM | POA: Diagnosis not present

## 2014-11-23 DIAGNOSIS — Z9884 Bariatric surgery status: Secondary | ICD-10-CM | POA: Diagnosis not present

## 2014-11-24 ENCOUNTER — Encounter: Payer: Managed Care, Other (non HMO) | Attending: Family Medicine | Admitting: *Deleted

## 2014-11-24 ENCOUNTER — Encounter: Payer: Self-pay | Admitting: *Deleted

## 2014-11-24 VITALS — Ht 65.5 in | Wt 121.0 lb

## 2014-11-24 DIAGNOSIS — Z713 Dietary counseling and surveillance: Secondary | ICD-10-CM | POA: Insufficient documentation

## 2014-11-24 DIAGNOSIS — F509 Eating disorder, unspecified: Secondary | ICD-10-CM | POA: Insufficient documentation

## 2014-11-24 NOTE — Progress Notes (Signed)
Medical record indicates polycystic ovaries.  Follow up at next visit
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
## 2014-12-06 DIAGNOSIS — F333 Major depressive disorder, recurrent, severe with psychotic symptoms: Secondary | ICD-10-CM | POA: Diagnosis not present

## 2014-12-06 DIAGNOSIS — F411 Generalized anxiety disorder: Secondary | ICD-10-CM | POA: Diagnosis not present

## 2014-12-08 ENCOUNTER — Encounter: Payer: Self-pay | Admitting: *Deleted

## 2014-12-08 ENCOUNTER — Encounter: Payer: Managed Care, Other (non HMO) | Attending: Family Medicine | Admitting: *Deleted

## 2014-12-08 VITALS — Ht 65.5 in | Wt 129.5 lb

## 2014-12-08 DIAGNOSIS — F509 Eating disorder, unspecified: Secondary | ICD-10-CM | POA: Insufficient documentation

## 2014-12-08 DIAGNOSIS — Z713 Dietary counseling and surveillance: Secondary | ICD-10-CM | POA: Insufficient documentation
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
## 2014-12-13 ENCOUNTER — Telehealth (HOSPITAL_COMMUNITY): Payer: Self-pay

## 2014-12-14 NOTE — Telephone Encounter (Signed)
I returned her therapist called and left a message to call us back

## 2014-12-19 DIAGNOSIS — F333 Major depressive disorder, recurrent, severe with psychotic symptoms: Secondary | ICD-10-CM | POA: Diagnosis not present

## 2014-12-19 DIAGNOSIS — R630 Anorexia: Secondary | ICD-10-CM | POA: Diagnosis not present

## 2014-12-19 DIAGNOSIS — F411 Generalized anxiety disorder: Secondary | ICD-10-CM | POA: Diagnosis not present

## 2014-12-19 NOTE — Telephone Encounter (Signed)
I returned therapist call.  Discuss patient anxiety, binge eating, psychosis and depression in detail.  Therapist will work with the patient about her issues.

## 2014-12-20 ENCOUNTER — Telehealth (HOSPITAL_COMMUNITY): Payer: Self-pay

## 2014-12-20 NOTE — Telephone Encounter (Signed)
I returned patient's phone call.  Her anxiety is increased since her therapist told that if she did not gain weight than she need to be hospitalized.  We talked about resuming ECT treatment which helped her in the past.  Patient is scheduled to see on September 4.  We will discuss more on her appointment.  In the meantime recommended to take 4 Klonopin as needed.  Patient has refill remaining.

## 2014-12-26 ENCOUNTER — Encounter: Payer: Self-pay | Admitting: Family Medicine

## 2014-12-26 ENCOUNTER — Encounter: Payer: Managed Care, Other (non HMO) | Admitting: *Deleted

## 2014-12-26 ENCOUNTER — Encounter: Payer: Self-pay | Admitting: *Deleted

## 2014-12-26 ENCOUNTER — Telehealth: Payer: Self-pay | Admitting: *Deleted

## 2014-12-26 ENCOUNTER — Ambulatory Visit (INDEPENDENT_AMBULATORY_CARE_PROVIDER_SITE_OTHER): Payer: Managed Care, Other (non HMO) | Admitting: Family Medicine

## 2014-12-26 VITALS — BP 98/60 | HR 58 | Resp 16 | Ht 65.5 in | Wt 124.4 lb

## 2014-12-26 DIAGNOSIS — F509 Eating disorder, unspecified: Secondary | ICD-10-CM | POA: Diagnosis not present

## 2014-12-26 DIAGNOSIS — E441 Mild protein-calorie malnutrition: Secondary | ICD-10-CM

## 2014-12-26 DIAGNOSIS — F5000 Anorexia nervosa, unspecified: Secondary | ICD-10-CM

## 2014-12-26 DIAGNOSIS — Z0000 Encounter for general adult medical examination without abnormal findings: Secondary | ICD-10-CM

## 2014-12-26 NOTE — Patient Instructions (Signed)
Breakfast: yogurt Lunch: protein shake S: sugar-free jello Dinner: protein with vegetable  Snack on vegetables   Talk with Nira Conn about managing anxiety after eating CALL [facility] and start admission process.

## 2014-12-26 NOTE — Telephone Encounter (Signed)
Pt called lmom for a return call about some forms that's needs to be done before her inpatient treatment. Please advise
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
## 2014-12-26 NOTE — Patient Instructions (Signed)
F/U in 6 months, call if you need me before   Please remember to get the  flu vaccine and let us know    Pls sign for your recent labs, wecan do EKG if needed, just call  Inpatient care will get you on track with healthy nutrition that you do need  Very happy to see you, and I think of you a lot and continue to wish you improved health

## 2014-12-26 NOTE — Progress Notes (Signed)
Subjective:    Patient ID: Katelyn [name], female    DOB: [birth year], 39 y.o.   MRN: [id number]  HPI  Preventive Screening-Counseling & Management   Patient present here today for a Medicare annual wellness visit.Initial   Current Problems (verified)   Medications Prior to Visit Allergies (verified)   PAST HISTORY  Family History (verified)   Social History Married for 16 years, 2 children, disabled since 2011. Former smoker, quit in [place]  Current exercise habits: Not currently allowed to exercise- Dr's orders    Dietary issues discussed: dealing with anorexia now, diet very limited    Cardiac risk factors:   Depression Screen  (Note: if answer to either of the following is "Yes", a more complete depression screening is indicated)  Not currently suicidal or homicidal. Treated by psychiatry  Over the past two weeks, have you felt down, depressed or hopeless? Yes  Over the past two weeks, have you felt little interest or pleasure in doing things? Yes   Have you lost interest or pleasure in daily life? Yes  Do you often feel hopeless? yes Do you cry easily over simple problems? Yes    Activities of Daily Living  In your present state of health, do you have any difficulty performing the following activities?  Driving?: No Managing money?: Her father has control over her finances  Feeding yourself?:No Getting from bed to chair?:No Climbing a flight of stairs?:No Preparing food and eating?:No Bathing or showering?:No Getting dressed?:No Getting to the toilet?:No Using the toilet?:No Moving around from place to place?: No  Fall Risk Assessment In the past year have you fallen or had a near fall?:No Are you currently taking any medications that make you dizzy?:yes when she is not eating at times gets dark spells as if she will pass out   Hearing Difficulties: No Do you often ask people to speak up or repeat themselves?:No Do you experience  ringing or noises in your ears?:No Do you have difficulty understanding soft or whispered voices?:No  Cognitive Testing  Alert? Yes Normal Appearance?Yes  Oriented to person? Yes Place? Yes  Time? Yes  Displays appropriate judgment?Yes  Can read the correct time from a watch face? Yes  Are you having problems remembering things? Has had ECT and that has affected her memory   Advanced Directives have been discussed with the patient?Yes, no living will received  brochure  , full code   List the Names of Other Physician/Practitioners you currently use:  Dr Chalmers Cater (endo) [name] (psych) Dr Posey Pronto (neuro)  Dr Abran Cantor (surgeon) Dr Dellis Filbert (gyne)   Indicate any recent Medical Services you may have received from other than [facility] providers in the past year (date may be approximate).   Assessment:    Annual Wellness Exam , initial  Plan:        Medicare Attestation  I have personally reviewed:  The patient's medical and social history  Their use of alcohol, tobacco or illicit drugs  Their current medications and supplements  The patient's functional ability including ADLs,fall risks, home safety risks, cognitive, and hearing and visual impairment  Diet and physical activities  Evidence for depression or mood disorders  The patient's weight, height, BMI, and visual acuity have been recorded in the chart. I have made referrals, counseling, and provided education to the patient based on review of the above and I have provided the patient with a written personalized care plan for preventive services.  Review of Systems     Objective:   Physical Exam  BP 98/60 mmHg  Pulse 58  Resp 16  Ht 5' 5.5" (1.664 m)  Wt 124 lb 6.4 oz (56.427 kg)  BMI 20.38 kg/m2  SpO2 100%      Assessment & Plan:  Medicare annual wellness visit, initial Annual exam as documented. Counseling done  re healthy lifestyle involving commitment to regular exercise as able,  healthy diet, and attaining  healthy weight.The importance of adequate sleep also discussed. Regular seat belt use and home safety, is also discussed. Changes in health habits are decided on by the patient with goals and time frames  set for achieving them. Immunization and cancer screening needs are specifically addressed at this visit.

## 2014-12-27 ENCOUNTER — Other Ambulatory Visit: Payer: Self-pay | Admitting: Family Medicine

## 2014-12-27 ENCOUNTER — Encounter: Payer: Self-pay | Admitting: *Deleted

## 2014-12-27 ENCOUNTER — Ambulatory Visit (INDEPENDENT_AMBULATORY_CARE_PROVIDER_SITE_OTHER): Payer: Managed Care, Other (non HMO)

## 2014-12-27 DIAGNOSIS — Z111 Encounter for screening for respiratory tuberculosis: Secondary | ICD-10-CM

## 2014-12-27 DIAGNOSIS — F5000 Anorexia nervosa, unspecified: Secondary | ICD-10-CM | POA: Diagnosis not present

## 2014-12-27 NOTE — Telephone Encounter (Signed)
Called patient no answer.

## 2014-12-27 NOTE — Telephone Encounter (Signed)
Will come today to bring the forms

## 2014-12-28 ENCOUNTER — Encounter: Payer: Self-pay | Admitting: Family Medicine

## 2014-12-28 ENCOUNTER — Telehealth: Payer: Self-pay

## 2014-12-28 ENCOUNTER — Ambulatory Visit (INDEPENDENT_AMBULATORY_CARE_PROVIDER_SITE_OTHER): Payer: Managed Care, Other (non HMO) | Admitting: Family Medicine

## 2014-12-28 VITALS — BP 92/60 | HR 60 | Resp 16 | Ht 65.5 in | Wt 124.0 lb

## 2014-12-28 DIAGNOSIS — F411 Generalized anxiety disorder: Secondary | ICD-10-CM

## 2014-12-28 DIAGNOSIS — R51 Headache: Secondary | ICD-10-CM | POA: Diagnosis not present

## 2014-12-28 DIAGNOSIS — Z022 Encounter for examination for admission to residential institution: Secondary | ICD-10-CM | POA: Diagnosis not present

## 2014-12-28 DIAGNOSIS — R519 Headache, unspecified: Secondary | ICD-10-CM

## 2014-12-28 DIAGNOSIS — Z0000 Encounter for general adult medical examination without abnormal findings: Secondary | ICD-10-CM

## 2014-12-28 DIAGNOSIS — F333 Major depressive disorder, recurrent, severe with psychotic symptoms: Secondary | ICD-10-CM | POA: Diagnosis not present

## 2014-12-28 DIAGNOSIS — R7989 Other specified abnormal findings of blood chemistry: Secondary | ICD-10-CM

## 2014-12-28 DIAGNOSIS — G8929 Other chronic pain: Secondary | ICD-10-CM

## 2014-12-28 LAB — COMPREHENSIVE METABOLIC PANEL
ALK PHOS: 83 U/L (ref 33–115)
ALT: 27 U/L (ref 6–29)
AST: 17 U/L (ref 10–30)
Albumin: 4 g/dL (ref 3.6–5.1)
BUN: 13 mg/dL (ref 7–25)
CHLORIDE: 114 mmol/L — AB (ref 98–110)
CO2: 23 mmol/L (ref 20–31)
CREATININE: 0.78 mg/dL (ref 0.50–1.10)
Calcium: 8.8 mg/dL (ref 8.6–10.2)
GLUCOSE: 25 mg/dL — AB (ref 65–99)
POTASSIUM: 3.9 mmol/L (ref 3.5–5.3)
SODIUM: 148 mmol/L — AB (ref 135–146)
Total Bilirubin: 0.3 mg/dL (ref 0.2–1.2)
Total Protein: 6.2 g/dL (ref 6.1–8.1)

## 2014-12-28 LAB — CBC WITH DIFFERENTIAL/PLATELET
Basophils Absolute: 0 10*3/uL (ref 0.0–0.1)
Basophils Relative: 0 % (ref 0–1)
EOS ABS: 0.1 10*3/uL (ref 0.0–0.7)
EOS PCT: 2 % (ref 0–5)
HCT: 37.8 % (ref 36.0–46.0)
Hemoglobin: 12.4 g/dL (ref 12.0–15.0)
LYMPHS ABS: 2.2 10*3/uL (ref 0.7–4.0)
Lymphocytes Relative: 36 % (ref 12–46)
MCH: 32 pg (ref 26.0–34.0)
MCHC: 32.8 g/dL (ref 30.0–36.0)
MCV: 97.4 fL (ref 78.0–100.0)
MPV: 10.1 fL (ref 8.6–12.4)
Monocytes Absolute: 0.5 10*3/uL (ref 0.1–1.0)
Monocytes Relative: 8 % (ref 3–12)
Neutro Abs: 3.2 10*3/uL (ref 1.7–7.7)
Neutrophils Relative %: 54 % (ref 43–77)
PLATELETS: 322 10*3/uL (ref 150–400)
RBC: 3.88 MIL/uL (ref 3.87–5.11)
RDW: 14.1 % (ref 11.5–15.5)
WBC: 6 10*3/uL (ref 4.0–10.5)

## 2014-12-28 LAB — URINALYSIS
Bilirubin Urine: NEGATIVE
Glucose, UA: NEGATIVE
Hgb urine dipstick: NEGATIVE
NITRITE: NEGATIVE
PROTEIN: NEGATIVE
SPECIFIC GRAVITY, URINE: 1.025 (ref 1.001–1.035)
pH: 6 (ref 5.0–8.0)

## 2014-12-28 LAB — VITAMIN B12: Vitamin B-12: 514 pg/mL (ref 211–911)

## 2014-12-28 LAB — LIPASE: Lipase: 34 U/L (ref 7–60)

## 2014-12-28 LAB — AMYLASE: Amylase: 36 U/L (ref 0–105)

## 2014-12-28 LAB — HEPATITIS PANEL, ACUTE
HCV Ab: NEGATIVE
HEP A IGM: NONREACTIVE
Hep B C IgM: NONREACTIVE
Hepatitis B Surface Ag: NEGATIVE

## 2014-12-28 LAB — FERRITIN: FERRITIN: 9 ng/mL — AB (ref 10–291)

## 2014-12-28 LAB — PREGNANCY, URINE: Preg Test, Ur: NEGATIVE

## 2014-12-28 LAB — TSH: TSH: 1.155 u[IU]/mL (ref 0.350–4.500)

## 2014-12-28 LAB — T4: T4 TOTAL: 6.3 ug/dL (ref 4.5–12.0)

## 2014-12-28 LAB — T3: T3 TOTAL: 56.9 ng/dL — AB (ref 80.0–204.0)

## 2014-12-28 LAB — VITAMIN D 25 HYDROXY (VIT D DEFICIENCY, FRACTURES): VIT D 25 HYDROXY: 50 ng/mL (ref 30–100)

## 2014-12-28 LAB — MAGNESIUM: Magnesium: 2.1 mg/dL (ref 1.5–2.5)

## 2014-12-28 LAB — PHOSPHORUS: Phosphorus: 3.6 mg/dL (ref 2.5–4.5)

## 2014-12-28 NOTE — Patient Instructions (Addendum)
F/u in 3 month, call if you need me sooner  You are referred to endo Dr Soyla Murphy to further eval thyroid  You are cleared medically for inpatient treatment of your eating disorder  You NEED inpatient help with anorexia as soon as possible  pls return for TB skin test reading, we will fax your paper work for you as well  Thanks for choosing [facility], we consider it a privelige to serve you.   Hep A vaccine today at the pharmacy

## 2014-12-28 NOTE — Telephone Encounter (Signed)
Katelyn [name] from [place] lab called in a Critical low Glucose this am at 8:18am of 25 which was repeated and verified. Pt to come into office this am for an appt

## 2014-12-29 ENCOUNTER — Ambulatory Visit (INDEPENDENT_AMBULATORY_CARE_PROVIDER_SITE_OTHER): Payer: Managed Care, Other (non HMO) | Admitting: Psychiatry

## 2014-12-29 ENCOUNTER — Encounter (HOSPITAL_COMMUNITY): Payer: Self-pay | Admitting: Psychiatry

## 2014-12-29 VITALS — BP 98/62 | HR 67 | Ht 65.5 in | Wt 130.0 lb

## 2014-12-29 DIAGNOSIS — F509 Eating disorder, unspecified: Secondary | ICD-10-CM

## 2014-12-29 DIAGNOSIS — F332 Major depressive disorder, recurrent severe without psychotic features: Secondary | ICD-10-CM | POA: Diagnosis not present

## 2014-12-29 DIAGNOSIS — F411 Generalized anxiety disorder: Secondary | ICD-10-CM

## 2014-12-29 LAB — URINE CULTURE
Colony Count: NO GROWTH
Organism ID, Bacteria: NO GROWTH

## 2014-12-29 MED ORDER — MIRTAZAPINE 15 MG PO TBDP: 15.0000 mg | ORAL_TABLET | Freq: Every day | ORAL | Status: DC

## 2014-12-29 NOTE — Progress Notes (Signed)
[name] [id number] Progress Note  Katelyn [name] [id number] 39 y.o.  12/29/2014 11:37 AM  Chief Complaint:  I'm not eating well.  I'm scared to eat.                     History of Present Illness:  Katelyn [name] came for her appointment.  She has seen recently her primary care physician and she had blood work yesterday which shows blood sugar only 25.  She has noticed episodes of sweating, shakes, tremors and difficulty eating.  She believe Geodon causing difficulty eating and swallowing.  However she admitted that she is very scared to eat because she does not want to gain weight.  She had lost a lot of weight in recent months.  She is seeing Production assistant, radio for counseling.  We have long discussion in the past about getting treatment for eating disorder and now finally she agreed to go [facility] for inpatient treatment.  She admitted having paranoia about food.  She does not eat outside because she does not sure if something put in the food.  She sleeping 4-6 hours.  She is no longer taking Wellbutrin because it felt that it may causing decreased appetite.  She does not want to stop Topamax because it is helping her headaches.  I have noticed since stopped ECT treatment she's been more anxious and isolated.  She is also concerned about her mother who diagnosed with cancer.  Though she denies any suicidal thoughts or homicidal thought but she is not eating which could be critical.  Today she did not allow [place] to get weight that I will not disclose the weight to her.  She had gained weight since the last visit but she does not want to listen because it make her more anxious and afraid.  Patient has negative distortion about her weight.  Heart per patient is low but stable.  She denies any aggressive behavior.  She denies any hallucination at this time.  She told that she is in a process of getting inpatient treatment at [facility].  Patient denies drinking or using any illegal substances.  Her  ADLs are good.  She denies any chest pain, shortness of breath, dizziness or any palpitation.  Her last blood work was done on September 1 shows sodium 148, potassium 3.9, chloride 114, glucose 25, phosphorous 3.6 her liver enzymes and CBC was normal.  Suicidal Ideation: No Plan Formed: No Patient has means to carry out plan: No  Homicidal Ideation: No Plan Formed: No Patient has means to carry out plan: No  Review of Systems  Constitutional: Positive for weight loss.  Cardiovascular: Negative for chest pain.  Gastrointestinal: Positive for nausea. Negative for constipation.  Skin: Negative for itching and rash.  Neurological: Positive for headaches. Negative for dizziness, tingling and tremors.  Psychiatric/Behavioral: Negative for depression, suicidal ideas, hallucinations and substance abuse. The patient is nervous/anxious. The patient does not have insomnia.        Memory impairment    Psychiatric: Agitation: No Hallucination: Denies.   Depressed Mood: no Insomnia: No Hypersomnia: No Altered Concentration: No Feels Worthless: No Grandiose Ideas: No Belief In Special Powers: No New/Increased Substance Abuse: No Compulsions: No  Neurologic: Headache: Yes Seizure: No Paresthesias: No  Psychosocial history Patient lives with her husband and children.  Her parents are very supportive who live close by.    Medical history Patient has history of polycystic ovary disease, obesity, headache and chronic pain.  Patient has gastric bypass surgery.  Her primary care physician is Dr. [name].  Outpatient Encounter Prescriptions as of 12/29/2014  Medication Sig  . ziprasidone (GEODON) 80 MG capsule Take 80 mg by mouth at bedtime.  . clonazePAM (KLONOPIN) 0.5 MG tablet Take 1 tablet (0.5 mg total) by mouth 3 (three) times daily as needed for anxiety.  . cyclobenzaprine (FLEXERIL) 5 MG tablet Take 1 tablet (5 mg total) by mouth every 8 (eight) hours as needed for muscle spasms.   Marland Kitchen FLUoxetine (PROZAC) 40 MG capsule Take 1 capsule (40 mg total) by mouth daily.  . magnesium oxide (MAG-OX) 400 MG tablet Take 400 mg by mouth daily.  . mirtazapine (REMERON SOL-TAB) 15 MG disintegrating tablet Take 1 tablet (15 mg total) by mouth at bedtime.  . Multiple Vitamin (MULTIVITAMIN WITH MINERALS) TABS tablet Take 1 tablet by mouth every morning.  . ondansetron (ZOFRAN) 4 MG tablet Take 1 tablet (4 mg total) by mouth every 8 (eight) hours as needed for nausea or vomiting.  . topiramate (TOPAMAX) 100 MG tablet Take 100 mg by mouth 2 (two) times daily.   . ziprasidone (GEODON) 80 MG capsule TAKE 2 CAPSULES AT BEDTIME (Patient not taking: Reported on 12/29/2014)  . [DISCONTINUED] buPROPion (WELLBUTRIN) 100 MG tablet Take 100 mg by mouth every morning.   No facility-administered encounter medications on file as of 12/29/2014.   No results found for this or any previous visit (from the past 2160 hour(s)).   Past Psychiatric History/Hospitalization(s): Patient has at least 8 psychiatric inpatient treatment.  Her last admission was in February 2015 .  In the past she had tried good response with ECT however she scared to try ECT.  In the past she had tried Cymbalta, Lexapro, Abilify, lithium, Wellbutrin, Lamictal, Ritalin, Risperdal, Neurontin, Vistaril , BuSpar and Valium.     Anxiety: Yes Bipolar Disorder: No Depression: Yes Mania: No Psychosis: Yes Schizophrenia: No Personality Disorder: No Hospitalization for psychiatric illness: Yes History of Electroconvulsive Shock Therapy: Yes Prior Suicide Attempts: No  Physical Exam: Constitutional:  BP 98/62 mmHg  Pulse 67  Ht 5' 5.5" (1.664 m)  Wt 130 lb (58.968 kg)  BMI 21.30 kg/m2  General Appearance: Anxious  Musculoskeletal: Strength & Muscle Tone: within normal limits Gait & Station: normal Patient leans: N/A  Mental status examination Patient is casually dressed and groomed.  She appears thin and had lost weight from the  past.  She is anxious but cooperative.  Her speech is soft clear and coherent.  She maintained fair eye contact.  She described her mood anxious and nervous and her affect is constricted.  Her attention and concentration is fair.  Her psychomotor activity is slow. She endorse paranoia but denies any delusions or hallucinations.  There were no flight if ideas or any loose association. Her fund of knowledge is adequate.  Her memory is grossly normal.  She is alert and oriented x3.  Her insight judgment and impulse control is okay.  Assessment: Axis I: Maj. depressive disorder with psychotic features, generalized anxiety disorder, eating disorder, weight loss and memory impairment  Axis II: Deferred  Axis III: Patient Active Problem List   Diagnosis Date Noted  . Physical exam 12/28/2014  . Severe recurrent major depression without psychotic features   . Sleep disorder 06/01/2013  . Generalized anxiety disorder 05/30/2013  . Chronic headaches 07/01/2012  . Depression, major, recurrent, severe with psychosis 06/17/2012  . Family hx colonic polyps 03/03/2012  . Eating disorder 12/18/2011  .  POLYCYSTIC OVARIAN DISEASE 09/01/2008    Plan: I had a long discussion with the patient about weight loss.  I review her blood work results for a low blood sugar.  I explained that episodes of sweating, tremors, shakes could be due to hypoglycemia and she should eat to get relief.  However she insists to cut down the Geodon because she felt it is causing EPS and dysphagia.  I told we can try reducing Geodon however if her symptoms of paranoia get worse then she should go back on 2 capsule of Geodon every night.  I will add low-dose Remeron to help her anxiety , insomnia and increased appetite.  Patient promised that she will eat home-cooked food since she does not trust the food from outside.  She has been eating Jell-O, fruit, and we discussed her target weight should be more than on at least 120.  Patient is  closely working with nutritionist.  She is willing to go inpatient [place] and I recommended if she needed any help from [place] that she should inform me.  Encouraged to keep appointment with [facility] and [facility].  Discuss critical value of low blood sugar and patient agreed for inpatient services for the treatment of eating disorder.  Discuss safety plan that anytime having active suicidal thoughts or homicidal thoughts and she need to call 911 or go to the local emergency room.  I will see her again in 3-4 weeks or sooner if needed.  Time spent 25 minutes.  More than 50% of the time spent in psychoeducation, counseling and coordination of care.  Patient is taking Topamax from Dr Romelle Starcher.    Surena Welge T., MD 12/29/2014

## 2014-12-30 NOTE — Assessment & Plan Note (Signed)
Has ahd multiple treatments in 2016 with ECT, not currently suicidal, homicidal or psychotic.

## 2014-12-30 NOTE — Assessment & Plan Note (Signed)
Controlled On current med through psych

## 2014-12-30 NOTE — Assessment & Plan Note (Signed)
Physical exam is normal Pt is medically cleared for admission to treatment  facility for her eating disorder, now manifest as anorexia. EKG shows NSR, no ischemic changes noted

## 2014-12-30 NOTE — Progress Notes (Signed)
   Subjective:    Patient ID: Katelyn [name], female    DOB: 1975-12-01, 39 y.o.   MRN: [id number]  HPI  Pt in for history review and form completion of this , and also medical examination as a pre requisite for her admission for inpatient management of her severe eating disorder in a residential facility , expected duration of stay is approximately 1 month , she reprots She is "ready to go" recognizes the need, and actually she had a blood sugar of 25 two  days ago when her lab was drawn She is to receive a hepatitis A vaccine at her pharmacy, and to return in am for PPD test to be read  Review of Systems See HPI Denies recent fever or chills. Denies sinus pressure, nasal congestion, ear pain or sore throat. Denies chest congestion, productive cough or wheezing. Denies chest pains, palpitations and leg swelling. C/o intermittent light headedness, however, this occurs she states , when she "knows that she has not had enough to eat" Denies abdominal pain, nausea, vomiting,diarrhea or constipation.   Denies dysuria, frequency, hesitancy or incontinence. Denies joint pain, swelling and limitation in mobility. Denies headaches, seizures, numbness, or tingling. Denies uncontrolled  depression, anxiety or insomnia.Is treated continually by psychiatry Denies skin break down or rash.        Objective:   Physical Exam BP 92/60 mmHg  Pulse 60  Resp 16  Ht 5' 5.5" (1.664 m)  Wt 124 lb (56.246 kg)  BMI 20.31 kg/m2  SpO2 100%  Patient alert and oriented and in no cardiopulmonary distress.  HEENT: No facial asymmetry, EOMI,   oropharynx pink and moist.  Neck supple no JVD, no mass.  Chest: Clear to auscultation bilaterally.  CVS: S1, S2 no murmurs, no S3.Regular rate.  ABD: Soft non tender.   Ext: No edema  MS: Adequate ROM spine, shoulders, hips and knees.  Skin: Intact, no ulcerations or rash noted.  Psych: Good eye contact, flat affect, mild short term memory loss  CNS:  CN 2-12 intact, power,  normal throughout.no focal deficits noted.      Assessment & Plan:  Encounter for examination for admission to residential institution Physical exam is normal Pt is medically cleared for admission to treatment  facility for her eating disorder, now manifest as anorexia. EKG shows NSR, no ischemic changes noted  Chronic headaches Well controlled on topamax  Depression, major, recurrent, severe with psychosis Has ahd multiple treatments in 2016 with ECT, not currently suicidal, homicidal or psychotic.  Generalized anxiety disorder Controlled On current med through psych

## 2014-12-30 NOTE — Assessment & Plan Note (Signed)
Well controlled on topamax

## 2015-01-01 ENCOUNTER — Encounter: Payer: Self-pay | Admitting: Family Medicine

## 2015-01-01 DIAGNOSIS — Z0000 Encounter for general adult medical examination without abnormal findings: Secondary | ICD-10-CM | POA: Insufficient documentation

## 2015-01-01 LAB — BENZODIAZEPINES (GC/LC/MS), URINE
Alprazolam metabolite (GC/LC/MS), ur confirm: NEGATIVE ng/mL
Clonazepam metabolite (GC/LC/MS), ur confirm: 448 ng/mL — AB
Flurazepam metabolite (GC/LC/MS), ur confirm: NEGATIVE ng/mL
Lorazepam (GC/LC/MS), ur confirm: NEGATIVE ng/mL
Midazolam (GC/LC/MS), ur confirm: NEGATIVE ng/mL
Nordiazepam (GC/LC/MS), ur confirm: NEGATIVE ng/mL
Oxazepam (GC/LC/MS), ur confirm: NEGATIVE ng/mL
Temazepam (GC/LC/MS), ur confirm: NEGATIVE ng/mL
Triazolam metabolite (GC/LC/MS), ur confirm: NEGATIVE ng/mL

## 2015-01-01 NOTE — Assessment & Plan Note (Addendum)
Annual exam as documented. Counseling done  re healthy lifestyle involving commitment to regular exercise as able,  healthy diet, and attaining healthy weight.The importance of adequate sleep also discussed. Regular seat belt use and home safety, is also discussed. Changes in health habits are decided on by the patient with goals and time frames  set for achieving them. Immunization and cancer screening needs are specifically addressed at this visit.

## 2015-01-02 ENCOUNTER — Telehealth: Payer: Self-pay | Admitting: *Deleted

## 2015-01-02 LAB — PRESCRIPTION MONITORING PROFILE (15 PANEL)
Amphetamine/Meth: NEGATIVE ng/mL
Barbiturate Screen, Urine: NEGATIVE ng/mL
Buprenorphine, Urine: NEGATIVE ng/mL
CARISOPRODOL, URINE: NEGATIVE ng/mL
CREATININE, URINE: 187.97 mg/dL (ref >20.0)
Cannabinoid Scrn, Ur: NEGATIVE ng/mL
Cocaine Metabolites: NEGATIVE ng/mL
Fentanyl, Ur: NEGATIVE ng/mL
MEPERIDINE UR: NEGATIVE ng/mL
METHADONE SCREEN, URINE: NEGATIVE ng/mL
NITRITES URINE, INITIAL: NEGATIVE ug/mL
Opiate Screen, Urine: NEGATIVE ng/mL
Oxycodone Screen, Ur: NEGATIVE ng/mL
PH URINE, INITIAL: 6.1 pH (ref 4.5–8.9)
PROPOXYPHENE: NEGATIVE ng/mL
Tramadol Scrn, Ur: NEGATIVE ng/mL
Zolpidem, Urine: NEGATIVE ng/mL

## 2015-01-02 NOTE — Telephone Encounter (Signed)
Labs faxed over on patient

## 2015-01-02 NOTE — Telephone Encounter (Signed)
[place] from [place] requesting labs on this pt ph number is [phone] and fax number is [phone]

## 2015-01-03 NOTE — Addendum Note (Signed)
Encounter addended by: Kathyrn Drown, RN on: 01/03/2015  4:23 PM<BR>     Documentation filed: PRL Based Order Sets, Orders

## 2015-01-04 DIAGNOSIS — R630 Anorexia: Secondary | ICD-10-CM | POA: Diagnosis not present

## 2015-01-04 DIAGNOSIS — F333 Major depressive disorder, recurrent, severe with psychotic symptoms: Secondary | ICD-10-CM | POA: Diagnosis not present

## 2015-01-04 DIAGNOSIS — F411 Generalized anxiety disorder: Secondary | ICD-10-CM | POA: Diagnosis not present

## 2015-01-09 ENCOUNTER — Ambulatory Visit: Payer: Self-pay | Admitting: *Deleted

## 2015-01-11 LAB — TB SKIN TEST
INDURATION: 0 mm
TB SKIN TEST: NEGATIVE

## 2015-01-17 ENCOUNTER — Encounter: Payer: Self-pay | Admitting: Family Medicine

## 2015-01-17 ENCOUNTER — Telehealth (HOSPITAL_COMMUNITY): Payer: Self-pay | Admitting: *Deleted

## 2015-01-17 ENCOUNTER — Ambulatory Visit (HOSPITAL_COMMUNITY): Payer: Self-pay | Admitting: Psychiatry

## 2015-01-17 NOTE — Telephone Encounter (Signed)
Checking for prior authorization of ECT in May. Pt has MCR and no authorization required for ECT.

## 2015-01-30 ENCOUNTER — Ambulatory Visit (HOSPITAL_COMMUNITY): Payer: Self-pay | Admitting: Psychiatry

## 2015-02-06 ENCOUNTER — Other Ambulatory Visit (HOSPITAL_COMMUNITY): Payer: Self-pay | Admitting: Psychiatry

## 2015-02-13 ENCOUNTER — Encounter: Payer: Managed Care, Other (non HMO) | Attending: Family Medicine | Admitting: *Deleted

## 2015-02-13 ENCOUNTER — Other Ambulatory Visit (HOSPITAL_COMMUNITY): Payer: Self-pay | Admitting: Psychiatry

## 2015-02-13 ENCOUNTER — Encounter: Payer: Self-pay | Admitting: *Deleted

## 2015-02-13 DIAGNOSIS — F509 Eating disorder, unspecified: Secondary | ICD-10-CM | POA: Insufficient documentation

## 2015-02-13 DIAGNOSIS — Z713 Dietary counseling and surveillance: Secondary | ICD-10-CM | POA: Insufficient documentation

## 2015-02-13 NOTE — Patient Instructions (Signed)
Follow Me on Social Media  [employer] [facility] HEAL * Beating Eating Disorders * Ed Bites * Beauty Redefined * Be Nourished [facility] ([place])  * [facility] [place]   carbs are brain fuel.  You need them to fuel your fight against Ed!!!

## 2015-02-13 NOTE — Progress Notes (Signed)
Appointment start time: 0800  Appointment end time: 0900  Patient was seen on 02/13/15 for nutrition counseling pertaining to disordered eating  Primary care provider: Dr. Moshe Cipro Therapist: Alvis Lemmings Any other medical team members: Dr. Excell Seltzer, bariatric surgeon; Dr. Adele Schilder, psychiatrist; Dr. Chalmers Cater, endocrinologist   Assessment: Katelyn [name] was admitted on 9/12 to [facility] residential treatment center and released on 10/13.  Admission weight: 127.8, discharge weight: 126.2 lb.  Inpatient RD reports she struggled with restricting, especially carbohydrates, and that is why she lost weight.   Katelyn [name] discharged herself prematurely as she wanted to be home with her family.  Inpatient team set goal weight at 138-170, based on previous weight of 260 lb prior to surgery. Inpatient team wanted to keep her in treatment as her weight was not restored.  Katelyn [name] states she struggled with volume of food while in treatment.  Didn't think they followed bariatric guidelines (did get 3 meals and 3 snacks while there).  Was able to decrease her volume and spread it out more.  completed 100% of meals while in treatment towards the end.  Physical discomfort with meal plan even after spreading it out.  Still feels like it's too much.  Is trying to condense her foods (dried fruit instead of fresh fruit).  Feels she has flexibility at home.  Though it's been difficult.  Is getting all her exchanges.  Is struggling with her goal weight.  Get her period back and feels like that is good enough.  Feels like she wants to modify her meal plan for maintenance mode, not restoration mode.  She made substantial progress towards combating her eating disorder (Ed). Didn't get along with therapist.  Didn't discuss much.  Doesn't have openings with outpatient therapist.   Realized she was in a cycle before with bigning and restricting Felt scared when she got home and binge.  Wanted to restrict yesterday  Hasn't weighed.  Got  rid of her scale and doesn't want to know her weight.  Deleted all her bariatric apps and support systems and realizes how triggering all that was.  Wants to recover.    Anthropometrics: "Ideal body weight": 127 lb + 13 lb Ideal BMI: 20.9 BMI today: 21.2 % Ideal today:  101% Goal weight range based on previous anthropometrics: 145-155; 138-170 lb per [facility] Previous LMP at 150 lb Suspect ~5-10 lb more weight gain needed for normal BM and consistent menstruation and strength to combat cognitive distortion Goal rate of weight gain:  0.5 lb/week  [facility] Body Composition Results  10/17/2013 11/15/13 12/27/13 02/07/14 04/11/14 06/14/14 07/12/14 11/24/14 12/07/14 12/26/14 02/13/15  BMI (kg/m^2) 44 40.3 35.7 32.9 28.7 24.8 23.6 19.8 21.5 20.7 21.2  Fat Mass (lbs) 140 124.0 101.5 83 65.5 45.5 36 19 22.5 16.5 23.5  Fat Free Mass (lbs) 124.5 118.0 116.5 114.5 107 103.5 106 102 107 108 104  Total Body Water (lbs) 91 86.5 85.5 84 78.5 76 77.5 74.5 78.5 79 76  %Body Fat      30.7 25.3 15.6 17.3 13.4 18.4          Mental health diagnosis: anorexia nervosa.  B/P subtype  Dietary assessment: Discharge meal plan, per Katelyn [name]: 2 dairy 3 fruit 8 starches 6 fat 6 pro 1 other 1 supplement 2-3 vegetable   24 hour recall B: boiled egg, sausage S: skipped L: bowl vegetable and chicken soup S: greek yogurt with granola and craisins D: chili with cornbread, salad with sunflower seeds and craisins, sweet dressing S: ice  cream Beverages: decaff tea and some coffee  Friday-Sunday felt she ate everything.  Can't remember.  Feels like it was more like a binge.  Not sure what happened.  Felt out of control.  Felt like the foods at [place] are trigger foods, but bought them anyway Trigger foods: carbs: animal crackers, goldfish, granola bars, ice cream, M&Ms. Started to follow meal plan,but then started to binge   Feels herself avoiding certain foods:  carbs Realizes she restriced yesterday: no carb at breakfast  What Methods Do You Use To Control Your Weight (Compensatory behaviors)?           Restricting (calories, fat, carbs): some restrictive thoughts, but trying to fight that.  Realizes she's not eating enough starch exchanges   Estimated energy intake: 1400 kcal  Estimated energy needs: BMR: 1333 kcal Maintenance needs: 1800 kcal Weight restoration at 0.5 lb/week: 2300 kcal 275 g CHO 120 g pro 80 g fat  Nutrition Diagnosis: NI-1.4 Inadequate energy intake As related to eating disorder.  As evidenced by consumption ~200 kcal/day vs 1800kcal recommended.  Intervention/Goals:   While her BMI is now "WNL" it may not be adequate for her.  She is still constipated and still has low % body fat.  She has had 1 menstrual cycle.  Educated Katelyn [name] of need for consistent menstruation for assurance of adequate weight restoration.  Her "idea body weight" is difficult to determine.  She has been heavy all her life and her parents are heavy.  Prior to bariatric surgery she was ~260 lb.  She lost her period at ~150 lb. This provider agrees with inpatient team that her "ideal weight" is higher than the formula calculation of 127 lb.  This provider does not think she needs to restore all the weight she lost, but it is difficult to say what is appropriate.  Informed Katelyn [name] she needs at least 2 consistent menstrual cycles and need to move her bowels independent of medications.  At that weight, we can maintain.  She did not argue.  Literature is inconsistent of % body fat needed for normal menstruation, but ~21-33% body fat is considered "healthy".  Katelyn [name] is at 18.4%, which would be considered too low.  25% body fat would yield total weight 138.5 lb for her.  This seems reasonable.     Discussed possible reasons for binge behavior.  Katelyn [name] agrees it's most likely because she's restricting or thinking restrictive thoughts.  She is also mindless in her eating  sometimes, which set her up for failure.  Discussed need for adequate carbs and dangers of restricting either total amount, or types of carbs as that "black and white" mentality is destructive.  She agreed.  Encouraged challenging cognitive distortions, having adequate carbs for "brain food" and following various organizations on social media for body positivity.  Gave food log to help tease out binge behaviors.   Monitoring and Evaluation: Patient will follow up in 1 weeks

## 2015-02-15 ENCOUNTER — Other Ambulatory Visit (HOSPITAL_COMMUNITY): Payer: Self-pay | Admitting: Psychiatry

## 2015-02-20 ENCOUNTER — Encounter: Payer: Self-pay | Admitting: *Deleted

## 2015-02-20 ENCOUNTER — Encounter: Payer: Managed Care, Other (non HMO) | Admitting: *Deleted

## 2015-02-20 VITALS — Ht 65.5 in | Wt 125.5 lb

## 2015-02-20 DIAGNOSIS — F509 Eating disorder, unspecified: Secondary | ICD-10-CM | POA: Diagnosis not present

## 2015-02-20 NOTE — Progress Notes (Signed)
Appointment start time: 0800  Appointment end time: 0900  Patient was seen on 02/20/15 for nutrition counseling pertaining to disordered eating  Primary care provider: Dr. Moshe Cipro Therapist: Alvis Lemmings Any other medical team members: Dr. Excell Seltzer, bariatric surgeon; Dr. Adele Schilder, psychiatrist; Dr. Chalmers Cater, endocrinologist   Assessment: Katelyn [name] states that she had "good days and bad days" this week.  She is still without a therapist as Alvis Lemmings is very busy.  She has been concerned about her meal plan as she thought the discharge meal plan was going to make her gain weight.  She ademetly does not want to gain weight.  She therefore started to restrict her intake.  She initially started restricting types of foods (carbs and fat) then she started restricting amounts of food.  By the end of the week, she was binging.  She found the food log helpful and was able to figure out on her own what happened with her food intake and how she set herself up for failure by restricting.  She has lost weight this week, both fat mass and lean mass.  She also consistently eats mindlessly in her bedroom, living room, or the car.  She was consistently hungry all week, until she started binging   Anthropometrics "Ideal body weight": 127 lb + 13 lb Ideal BMI: 20.9 BMI today: 20.9 % Ideal today:  100% Goal weight range based on previous anthropometrics: 145-155; 138-170 lb per [facility] Previous LMP at 150 lb Suspect ~5-10 lb more weight gain needed for normal BM and consistent menstruation and strength to combat cognitive distortion Goal rate of weight gain:  0.5 lb/week  [facility] Body Composition Results  10/17/2013 11/15/13 12/27/13 02/07/14 04/11/14 06/14/14 07/12/14 11/24/14 12/07/14 12/26/14 02/13/15 02/20/15  BMI (kg/m^2) 44 40.3 35.7 32.9 28.7 24.8 23.6 19.8 21.5 20.7 21.2 20.9  Fat Mass (lbs) 140 124.0 101.5 83 65.5 45.5 36 19 22.5 16.5 23.5 22.5  Fat Free Mass (lbs) 124.5  118.0 116.5 114.5 107 103.5 106 102 107 108 104 103  Total Body Water (lbs) 91 86.5 85.5 84 78.5 76 77.5 74.5 78.5 79 76 75.5  %Body Fat      30.7 25.3 15.6 17.3 13.4 18.4 17.8          Mental health diagnosis: anorexia nervosa.  B/P subtype  Dietary assessment: Discharge meal plan, per [name]: 2 dairy 3 fruit 8 starches 6 fat 6 pro 1 other 1 supplement 2-3 vegetable   24 hour recall B: caramel apple, cheerios, 1/2 bagel with low fat cream cheese, boiled egg S: granola bar, 1/2 bagel with low fat cream cheese, popcorn L: 1/2 pork chop, zucchini and carrots with hummus, orange S: 2 ice cream sandwiches S: granola bar, 8 peanut butter crackers, ice cream cone S: 10 fries S: ice cream sandwich    What Methods Do You Use To Control Your Weight (Compensatory behaviors)?           Restricting (calories, fat, carbs): restricting carbs and fat  Binging: 3 days/week   Estimated energy intake: 1300 kcal on restricted days and 2500 kcal on binge days  Estimated energy needs: BMR: 1333 kcal Maintenance needs: 1800 kcal Weight restoration at 0.5 lb/week: 2300 kcal 275 g CHO 120 g pro 80 g fat  Nutrition Diagnosis: NI-1.4 Inadequate energy intake As related to eating disorder.  As evidenced by consumption ~200 kcal/day vs 1800kcal recommended.  Intervention/Goals:   Discussed with [name] reasons for binge behavior.  [name] agrees it's most likely because she's  restricting or thinking restrictive thoughts.  She is also mindless in her eating sometimes, which set her up for failure.  Discussed need for adequate carbs and dangers of restricting either total amount, or types of carbs as that "black and white" mentality is destructive.  She agreed.  Encouraged challenging cognitive distortions, having adequate carbs for "brain food" and following various organizations on social media for body positivity.  Explained how she did not gain weight while in treatment like she  thought.  Explained she's losing muscle mass. The meal plan is to nourish her body.  She needs more enrgy to function and to be healthy for herself and for her children.  Gave more resources on body positivity.  Encouraged her to surround herself with those positive messages to dilute the negative messages in her head.   She realizes on her own she needs more and that's why she's binging.    This provider will try and find therapy options  Monitoring and Evaluation: Patient will follow up in 1 weeks

## 2015-02-26 ENCOUNTER — Encounter: Payer: Self-pay | Admitting: *Deleted

## 2015-02-26 ENCOUNTER — Encounter: Payer: Managed Care, Other (non HMO) | Admitting: *Deleted

## 2015-02-26 VITALS — Ht 65.5 in | Wt 129.0 lb

## 2015-02-26 DIAGNOSIS — F509 Eating disorder, unspecified: Secondary | ICD-10-CM | POA: Diagnosis not present

## 2015-02-26 NOTE — Progress Notes (Signed)
Appointment start time: 1000  Appointment end time: 1100  Patient was seen on 02/26/15 for nutrition counseling pertaining to disordered eating  Primary care provider: Dr. Moshe Cipro Therapist: Alvis Lemmings Any other medical team members: Dr. Excell Seltzer, bariatric surgeon; Dr. Adele Schilder, psychiatrist; Dr. Chalmers Cater, endocrinologist   Assessment: Katelyn [name] states that she had "good days and bad days" this week.  She is still without a therapist as Alvis Lemmings is very busy.  She is still restricting her intake.  She initially started restricting types of foods (carbs and fat) then she started restricting amounts of food.  By the end of the week, she was binging.  She found the food log helpful and was able to figure out on her own what happened with her food intake and how she set herself up for failure by restricting.  She has lost weight this week, both fat mass and lean mass.  She also consistently eats mindlessly in her bedroom, living room, or the car.  She was consistently hungry all week, until she started binging.  She thinks logging is triggering   Anthropometrics "Ideal body weight": 127 lb + 13 lb Ideal BMI: 20.9 BMI today: 20.9 % Ideal today:  100% Goal weight range based on previous anthropometrics: 145-155; 138-170 lb per [facility] Previous LMP at 150 lb Suspect ~5-10 lb more weight gain needed for normal BM and consistent menstruation and strength to combat cognitive distortion Goal rate of weight gain:  0.5 lb/week  [facility] Body Composition Results  10/17/2013 11/15/13 12/27/13 02/07/14 04/11/14 06/14/14 07/12/14 11/24/14 12/07/14 12/26/14 02/13/15 02/20/15 02/26/15  BMI (kg/m^2) 44 40.3 35.7 32.9 28.7 24.8 23.6 19.8 21.5 20.7 21.2 20.9 21.1  Fat Mass (lbs) 140 124.0 101.5 83 65.5 45.5 36 19 22.5 16.5 23.5 22.5 21.5  Fat Free Mass (lbs) 124.5 118.0 116.5 114.5 107 103.5 106 102 107 108 104 103 107.5  Total Body Water (lbs) 91 86.5 85.5 84 78.5 76  77.5 74.5 78.5 79 76 75.5 78.5  %Body Fat      30.7 25.3 15.6 17.3 13.4 18.4 17.8 16.6          Mental health diagnosis: anorexia nervosa.  B/P subtype  Dietary assessment: Discharge meal plan, per [name]: 2 dairy 3 fruit 8 starches 6 fat 6 pro 1 other 1 supplement 2-3 vegetable   24 hour recall- doesn't remember yesterday. Friday B: bagel with cream cheese, [place] sausage S: yogurt with craisins L: 1/2 pimento cheese sandwich, carrots with hummus, apple, candy S: 2 slices ham and apple Binge: 3 poptarts, popcorn, pretzels, candy    What Methods Do You Use To Control Your Weight (Compensatory behaviors)?           Restricting (calories, fat, carbs): restricting carbs and fat  Binging: 3 days/week   Estimated energy intake: 1000 kcal on restricted days and 2500 kcal on binge days  Estimated energy needs: BMR: 1333 kcal Maintenance needs: 1800 kcal Weight restoration at 0.5 lb/week: 2300 kcal 275 g CHO 120 g pro 80 g fat  Nutrition Diagnosis: NI-1.4 Inadequate energy intake As related to eating disorder.  As evidenced by consumption ~200 kcal/day vs 1800kcal recommended.  Intervention/Goals:   Discussed with [name] reasons for binge behavior.  [name] agrees it's most likely because she's restricting or thinking restrictive thoughts.  She is also mindless in her eating sometimes, which set her up for failure.  Discussed need for adequate carbs and dangers of restricting either total amount, or types of carbs as that "black  and white" mentality is destructive.  She agreed.  Encouraged challenging cognitive distortions, having adequate carbs for "brain food" and following various organizations on social media for body positivity.  Explained how she did not gain weight while in treatment like she thought.  Explained she's losing muscle mass. The meal plan is to nourish her body.  She needs more enrgy to function and to be healthy for herself and for her children.  Gave  more resources on body positivity.  Encouraged her to surround herself with those positive messages to dilute the negative messages in her head.   She realizes on her own she needs more and that's why she's binging.    Log via [facility].  Take pictures  Monitoring and Evaluation: Patient will follow up in 1 weeks

## 2015-03-01 DIAGNOSIS — F411 Generalized anxiety disorder: Secondary | ICD-10-CM | POA: Diagnosis not present

## 2015-03-01 DIAGNOSIS — R630 Anorexia: Secondary | ICD-10-CM | POA: Diagnosis not present

## 2015-03-01 DIAGNOSIS — F333 Major depressive disorder, recurrent, severe with psychotic symptoms: Secondary | ICD-10-CM | POA: Diagnosis not present

## 2015-03-05 ENCOUNTER — Encounter (HOSPITAL_COMMUNITY): Payer: Self-pay | Admitting: Psychiatry

## 2015-03-05 ENCOUNTER — Ambulatory Visit (INDEPENDENT_AMBULATORY_CARE_PROVIDER_SITE_OTHER): Payer: Managed Care, Other (non HMO) | Admitting: Psychiatry

## 2015-03-05 VITALS — BP 122/77 | HR 89 | Ht 65.5 in | Wt 133.0 lb

## 2015-03-05 DIAGNOSIS — F331 Major depressive disorder, recurrent, moderate: Secondary | ICD-10-CM

## 2015-03-05 DIAGNOSIS — F411 Generalized anxiety disorder: Secondary | ICD-10-CM | POA: Diagnosis not present

## 2015-03-05 MED ORDER — FLUOXETINE HCL 40 MG PO CAPS: 40.0000 mg | ORAL_CAPSULE | Freq: Every day | ORAL | Status: DC

## 2015-03-05 MED ORDER — CLONAZEPAM 0.5 MG PO TABS: 0.5000 mg | ORAL_TABLET | Freq: Two times a day (BID) | ORAL | Status: DC

## 2015-03-05 MED ORDER — ZIPRASIDONE HCL 80 MG PO CAPS: 80.0000 mg | ORAL_CAPSULE | Freq: Every day | ORAL | Status: DC

## 2015-03-05 MED ORDER — FLUOXETINE HCL 20 MG PO CAPS: 20.0000 mg | ORAL_CAPSULE | Freq: Every day | ORAL | Status: DC

## 2015-03-05 MED ORDER — HYDROXYZINE PAMOATE 25 MG PO CAPS: 25.0000 mg | ORAL_CAPSULE | Freq: Every day | ORAL | Status: DC

## 2015-03-05 NOTE — Progress Notes (Addendum)
[facility] [phone] Progress Note  Katelyn [name] [id number] 39 y.o.  03/05/2015 10:35 AM  Chief Complaint:  I'm feeling better.  I did program at [employer].  I'm eating better.                       History of Present Illness:  Katelyn [name] came for her appointment.  She was last seen in September and after that she went to [facility] for her eating disorder program.  She did well while she was in the program however she has to sign out AMA because her parents were going out of the town and she has no one to keep the children.  But she is following their treatment plan .  Her Prozac was increased to 60 mg.  She is not taking Remeron because she is scared to gain weight.  But she is eating frequent meals and she has gained 6 pounds since the last visit.  Her energy level remains low but she is less depressed and denies any paranoia or any hallucination.  She will start counseling with [facility] for eating disorder.  Patient has appointment this Friday.  Patient is also seeing nutritionist and balancing her diet.n she denies any suicidal thoughts or homicidal thought.  Her thinking is much clearer and organized.  She does not feel that at this time she should pursue ECT treatment.  Sometimes she has anxiety but she takes Klonopin.  She cut down Klonopin from the past and takes only as needed.  However she is concerned about tremors and in the past she has taken Cogentin but cause dry mouth.  I suggested to try Vistaril as an alternative to help the tremors.  She is taking Geodon 80 mg daily.  Since she cut down from 160 mg she has seen improvement in her sedation and tremors.  She denies any irritability, anger, mood swing.  She is seeing [name] on a regular basis.  She is relieved that her husband is more involved in her treatment plan and also to help the kids.  She she mentioned her husband attended family counseling when she was in the program.  Recently she's seen primary care  physician and there has no changes.  She denies agitation, anger, feeling of hopelessness or worthlessness.  Her vitals are stable. Patient also talked psychotropic genetic testing which shows she can take Wellbutrin, Pristiq,Fetzima, selgline and viibryd if needed.  Suicidal Ideation: No Plan Formed: No Patient has means to carry out plan: No  Homicidal Ideation: No Plan Formed: No Patient has means to carry out plan: No  Review of Systems  Cardiovascular: Negative for chest pain.  Gastrointestinal: Negative for constipation.  Skin: Negative for itching and rash.  Neurological: Positive for tremors. Negative for dizziness and tingling.  Psychiatric/Behavioral: Negative for depression, suicidal ideas, hallucinations and substance abuse. The patient is nervous/anxious. The patient does not have insomnia.     Psychiatric: Agitation: No Hallucination: Denies.   Depressed Mood: no Insomnia: No Hypersomnia: No Altered Concentration: No Feels Worthless: No Grandiose Ideas: No Belief In Special Powers: No New/Increased Substance Abuse: No Compulsions: No  Neurologic: Headache: Yes Seizure: No Paresthesias: No  Psychosocial history Patient lives with her husband and children.  Her parents are very supportive who live close by.    Medical history Patient has history of polycystic ovary disease, obesity, headache and chronic pain.  Patient has gastric bypass surgery.  Her primary care physician is Dr.  [name].  Outpatient Encounter Prescriptions as of 03/05/2015  Medication Sig  . clonazePAM (KLONOPIN) 0.5 MG tablet Take 1 tablet (0.5 mg total) by mouth 2 (two) times daily.  . cyclobenzaprine (FLEXERIL) 5 MG tablet Take 1 tablet (5 mg total) by mouth every 8 (eight) hours as needed for muscle spasms.  Marland Kitchen docusate sodium (COLACE) 50 MG capsule Take 50 mg by mouth 2 (two) times daily.  Marland Kitchen FLUoxetine (PROZAC) 20 MG capsule Take 1 capsule (20 mg total) by mouth daily.  Marland Kitchen  FLUoxetine (PROZAC) 40 MG capsule Take 1 capsule (40 mg total) by mouth daily.  . hydrOXYzine (VISTARIL) 25 MG capsule Take 1 capsule (25 mg total) by mouth at bedtime.  . magnesium oxide (MAG-OX) 400 MG tablet Take 400 mg by mouth daily.  . Multiple Vitamin (MULTIVITAMIN WITH MINERALS) TABS tablet Take 1 tablet by mouth every morning.  . ondansetron (ZOFRAN) 4 MG tablet Take 1 tablet (4 mg total) by mouth every 8 (eight) hours as needed for nausea or vomiting.  . topiramate (TOPAMAX) 100 MG tablet Take 100 mg by mouth 2 (two) times daily.   . ziprasidone (GEODON) 80 MG capsule Take 1 capsule (80 mg total) by mouth at bedtime.  . [DISCONTINUED] clonazePAM (KLONOPIN) 0.5 MG tablet Take 1 tablet (0.5 mg total) by mouth 3 (three) times daily as needed for anxiety.  . [DISCONTINUED] FLUoxetine (PROZAC) 40 MG capsule Take 1 capsule (40 mg total) by mouth daily. (Patient taking differently: Take 60 mg by mouth daily. )  . [DISCONTINUED] mirtazapine (REMERON SOL-TAB) 15 MG disintegrating tablet Take 1 tablet (15 mg total) by mouth at bedtime.  . [DISCONTINUED] ziprasidone (GEODON) 80 MG capsule TAKE 2 CAPSULES AT BEDTIME (Patient not taking: Reported on 12/29/2014)  . [DISCONTINUED] ziprasidone (GEODON) 80 MG capsule Take 80 mg by mouth at bedtime.   No facility-administered encounter medications on file as of 03/05/2015.   Recent Results (from the past 2160 hour(s))  CBC with Differential/Platelet     Status: None   Collection Time: 12/27/14  2:15 PM  Result Value Ref Range   WBC 6.0 4.0 - 10.5 K/uL   RBC 3.88 3.87 - 5.11 MIL/uL   Hemoglobin 12.4 12.0 - 15.0 g/dL   HCT 37.8 36.0 - 46.0 %   MCV 97.4 78.0 - 100.0 fL   MCH 32.0 26.0 - 34.0 pg   MCHC 32.8 30.0 - 36.0 g/dL   RDW 14.1 11.5 - 15.5 %   Platelets 322 150 - 400 K/uL   MPV 10.1 8.6 - 12.4 fL   Neutrophils Relative % 54 43 - 77 %   Neutro Abs 3.2 1.7 - 7.7 K/uL   Lymphocytes Relative 36 12 - 46 %   Lymphs Abs 2.2 0.7 - 4.0 K/uL    Monocytes Relative 8 3 - 12 %   Monocytes Absolute 0.5 0.1 - 1.0 K/uL   Eosinophils Relative 2 0 - 5 %   Eosinophils Absolute 0.1 0.0 - 0.7 K/uL   Basophils Relative 0 0 - 1 %   Basophils Absolute 0.0 0.0 - 0.1 K/uL   Smear Review Criteria for review not met   Comprehensive metabolic panel     Status: Abnormal   Collection Time: 12/27/14  2:15 PM  Result Value Ref Range   Sodium 148 (H) 135 - 146 mmol/L   Potassium 3.9 3.5 - 5.3 mmol/L   Chloride 114 (H) 98 - 110 mmol/L   CO2 23 20 - 31 mmol/L  Glucose, Bld 25 (LL) 65 - 99 mg/dL    Comment: Result repeated and verified. No visible hemolysis.    BUN 13 7 - 25 mg/dL   Creat 0.78 0.50 - 1.10 mg/dL   Total Bilirubin 0.3 0.2 - 1.2 mg/dL   Alkaline Phosphatase 83 33 - 115 U/L   AST 17 10 - 30 U/L   ALT 27 6 - 29 U/L   Total Protein 6.2 6.1 - 8.1 g/dL   Albumin 4.0 3.6 - 5.1 g/dL   Calcium 8.8 8.6 - 10.2 mg/dL  Phosphorus     Status: None   Collection Time: 12/27/14  2:15 PM  Result Value Ref Range   Phosphorus 3.6 2.5 - 4.5 mg/dL  Hepatitis, Acute     Status: None   Collection Time: 12/27/14  2:15 PM  Result Value Ref Range   Hepatitis B Surface Ag NEGATIVE NEGATIVE   HCV Ab NEGATIVE NEGATIVE   Hep B C IgM NON REACTIVE NON REACTIVE    Comment: High levels of Hepatitis B Core IgM antibody are detectable during the acute stage of Hepatitis B. This antibody is used to differentiate current from past HBV infection.      Hep A IgM NON REACTIVE NON REACTIVE    Comment:   Effective March 13, 2014, Hepatitis Acute Panel (test code [id number]) will be revised to automatically reflex to the Hepatitis C Viral RNA, Quantitative, Real-Time PCR assay if the Hepatitis C antibody screening result is Reactive. This action is being taken to ensure that the CDC/USPSTF recommended HCV diagnostic algorithm with the appropriate test reflex needed for accurate interpretation is followed.     TSH     Status: None   Collection Time: 12/27/14   2:15 PM  Result Value Ref Range   TSH 1.155 0.350 - 4.500 uIU/mL  T3     Status: Abnormal   Collection Time: 12/27/14  2:15 PM  Result Value Ref Range   T3, Total 56.9 (L) 80.0 - 204.0 ng/dL  T4     Status: None   Collection Time: 12/27/14  2:15 PM  Result Value Ref Range   T4, Total 6.3 4.5 - 12.0 ug/dL  Magnesium     Status: None   Collection Time: 12/27/14  2:15 PM  Result Value Ref Range   Magnesium 2.1 1.5 - 2.5 mg/dL  Amylase     Status: None   Collection Time: 12/27/14  2:15 PM  Result Value Ref Range   Amylase 36 0 - 105 U/L  Lipase     Status: None   Collection Time: 12/27/14  2:15 PM  Result Value Ref Range   Lipase 34 7 - 60 U/L  Pregnancy, urine     Status: None   Collection Time: 12/27/14  2:15 PM  Result Value Ref Range   Preg Test, Ur NEG     Comment: The sensitivity for this methodology is >24 mIU/mL.     Vitamin D (25 hydroxy)     Status: None   Collection Time: 12/27/14  2:15 PM  Result Value Ref Range   Vit D, 25-Hydroxy 50 30 - 100 ng/mL    Comment: Vitamin D Status           25-OH Vitamin D        Deficiency                <20 ng/mL        Insufficiency  20 - 29 ng/mL        Optimal             > or = 30 ng/mL   For 25-OH Vitamin D testing on patients on D2-supplementation and patients for whom quantitation of D2 and D3 fractions is required, the QuestAssureD 25-OH VIT D, (D2,D3), LC/MS/MS is recommended: order code [phone] (patients > 2 yrs).   Footnotes:  (1) ** Please note change in unit of measure and reference range(s). **     B12     Status: None   Collection Time: 12/27/14  2:15 PM  Result Value Ref Range   Vitamin B-12 514 211 - 911 pg/mL  Ferritin     Status: Abnormal   Collection Time: 12/27/14  2:15 PM  Result Value Ref Range   Ferritin 9 (L) 10 - 291 ng/mL  Prescript Monitor Profile(15)     Status: None   Collection Time: 12/27/14  2:15 PM  Result Value Ref Range   Creatinine, Urine 187.97 >20.0 mg/dL   pH, Initial 6.1  4.5 - 8.9 pH   Nitrites, Initial NEG Cutoff:200 ug/mL   Amphetamine/Meth NEG Cutoff:500 ng/mL   Barbiturate Screen, Urine NEG Cutoff:200 ng/mL   Benzodiazepine Screen, Urine PPS Cutoff:100 ng/mL   Buprenorphine, Urine NEG Cutoff:10 ng/mL   Cannabinoid Scrn, Ur NEG Cutoff:50 ng/mL   Cocaine Metabolites NEG Cutoff:150 ng/mL   Methadone Screen, Urine NEG Cutoff:300 ng/mL   Oxycodone Screen, Ur NEG Cutoff:100 ng/mL   Tramadol Scrn, Ur NEG Cutoff:200 ng/mL   Propoxyphene NEG Cutoff:300 ng/mL   Opiate Screen, Urine NEG Cutoff:100 ng/mL   Zolpidem, Urine NEG Cutoff:20 ng/mL   Fentanyl, Ur NEG Cutoff:2 ng/mL   Meperidine, Ur NEG Cutoff:200 ng/mL   Carisoprodol, Urine NEG Cutoff:100 ng/mL   Prescribed Drug 1 NIACIN     Comment: * (PPS) Presumptive positive screen result to be verified by         quantitative LC/MS or GC/MS confirmation testing.   Urinalysis     Status: Abnormal   Collection Time: 12/27/14  2:15 PM  Result Value Ref Range   Color, Urine YELLOW YELLOW    Comment: ** Please note change in unit of measure and reference range(s). **      APPearance CLEAR CLEAR   Specific Gravity, Urine 1.025 1.001 - 1.035   pH 6.0 5.0 - 8.0   Glucose, UA NEGATIVE NEGATIVE   Bilirubin Urine NEGATIVE NEGATIVE   Ketones, ur TRACE (A) NEGATIVE   Hgb urine dipstick NEGATIVE NEGATIVE   Protein, ur NEGATIVE NEGATIVE   Nitrite NEGATIVE NEGATIVE   Leukocytes, UA TRACE (A) NEGATIVE  Urine culture     Status: None   Collection Time: 12/27/14  2:15 PM  Result Value Ref Range   Colony Count NO GROWTH    Organism ID, Bacteria NO GROWTH   Benzodiazepines (GC/LC/MS), urine     Status: Abnormal   Collection Time: 12/27/14  2:15 PM  Result Value Ref Range   Alprazolam metabolite (GC/LC/MS), ur confirm NEG <25 ng/mL   Midazolam (GC/LC/MS), ur confirm NEG <50 ng/mL   Triazolam metabolite (GC/LC/MS), ur confirm NEG <50 ng/mL   Clonazepam metabolite (GC/LC/MS), ur confirm 448 (A) <25 ng/mL   Flurazepam  metabolite (GC/LC/MS), ur confirm NEG <50 ng/mL   Lorazepam (GC/LC/MS), ur confirm NEG <50 ng/mL   Nordiazepam (GC/LC/MS), ur confirm NEG <50 ng/mL   Oxazepam (GC/LC/MS), ur confirm NEG <50 ng/mL   Temazepam (GC/LC/MS), ur confirm NEG <50 ng/mL  TB Skin Test     Status: None   Collection Time: 01/11/15  4:31 PM  Result Value Ref Range   TB Skin Test Negative    Induration 0 mm     Past Psychiatric History/Hospitalization(s): Patient has at least 8 psychiatric inpatient treatment.  Her last admission was in February 2015 .  In the past she had tried good response with ECT however she scared to try ECT.  In the past she had tried Cymbalta, Lexapro, Abilify, lithium, Wellbutrin, Lamictal, Ritalin, Risperdal, Neurontin, Vistaril , BuSpar and Valium.     Anxiety: Yes Bipolar Disorder: No Depression: Yes Mania: No Psychosis: Yes Schizophrenia: No Personality Disorder: No Hospitalization for psychiatric illness: Yes History of Electroconvulsive Shock Therapy: Yes Prior Suicide Attempts: No  Physical Exam: Constitutional:  BP 122/77 mmHg  Pulse 89  Ht 5' 5.5" (1.664 m)  Wt 133 lb (60.328 kg)  BMI 21.79 kg/m2  LMP 01/22/2015  General Appearance: Anxious  Musculoskeletal: Strength & Muscle Tone: within normal limits Gait & Station: normal Patient leans: N/A  Mental status examination Patient is casually dressed and groomed.  She is anxious but cooperative.  She maintained good eye contact.  Her speech is soft clear and coherent.  She described her mood anxious and her affect is appropriate.  Her attention and concentration is fair.  Her psychomotor activity is slow.  She denies any auditory or visual hallucination.  She denies any paranoia, delusions or any obsessive thoughts.  There were no flight if ideas or any loose association. Her fund of knowledge is adequate.  Her memory is grossly normal.  She is alert and oriented x3.  Her insight judgment and impulse control is  okay.  Assessment: Axis I: Maj. depressive disorder with psychotic features, generalized anxiety disorder, eating disorder,  Axis II: Deferred  Axis III: Patient Active Problem List   Diagnosis Date Noted  . Medicare annual wellness visit, initial 01/01/2015  . Encounter for examination for admission to residential institution 12/28/2014  . Severe recurrent major depression without psychotic features ([place])   . Sleep disorder 06/01/2013  . Generalized anxiety disorder 05/30/2013  . Chronic headaches 07/01/2012  . Depression, major, recurrent, severe with psychosis ([place]) 06/17/2012  . Family hx colonic polyps 03/03/2012  . Eating disorder 12/18/2011  . POLYCYSTIC OVARIAN DISEASE 09/01/2008    Plan: I review her previous records, current medication and her treatment plan.  She is following a meal plan since she left from [place] .  She had gained 6 pounds since the last visit.  Patient also feels relieved and pleased with her progress.  She has once binge eating episode but she scheduled to see [facility] on Friday who is her eating disorder therapist.  I also encouraged to see [name] for her regular counseling.  I recommended to try Vistaril 25 mg to help the tremors and anxiety and if she liked that she will call us back so we can add more refills.  She had tried Cogentin in the past but complained of dry mouth.  Continue Geodon 80 mg at bedtime, Klonopin 0.5 mg as needed.  A new prescription of 40 mg Prozac and 20 mg Prozac is given to her pharmacy.  She is taking 60 mg Prozac altogether.  Discussed medication side effects and benefits.  Patient also brought copy of psychotropic genetic testing medication results and I did discuss in detail about these medications.  We will keep a copy for the future if needed.  However  at this time she does not want to try a different medication.  Recommended to call us back if she has any question or any concern.  I will see her again in 6  weeks. Discuss safety plan that anytime having active suicidal thoughts or homicidal thoughts and she need to call 911 or go to the local emergency room.  Time spent 25 minutes.  More than 50% of the time spent in psychoeducation, counseling and coordination of care.  Patient is taking Topamax from Dr Romelle Starcher.    ARFEEN,SYED T., MD 03/05/2015

## 2015-03-06 ENCOUNTER — Encounter: Payer: Managed Care, Other (non HMO) | Attending: Family Medicine | Admitting: *Deleted

## 2015-03-06 ENCOUNTER — Other Ambulatory Visit (HOSPITAL_COMMUNITY): Payer: Self-pay | Admitting: Psychiatry

## 2015-03-06 VITALS — Ht 65.5 in | Wt 131.5 lb

## 2015-03-06 DIAGNOSIS — F509 Eating disorder, unspecified: Secondary | ICD-10-CM

## 2015-03-06 DIAGNOSIS — Z713 Dietary counseling and surveillance: Secondary | ICD-10-CM | POA: Diagnosis not present

## 2015-03-06 NOTE — Patient Instructions (Addendum)
3 dairy 3 fruit 9 starches 7 fat 7 pro 1 other 2-3 vegetable  B: boiled egg, 1/2 bagel with cream cheese S: granola bar with fruit L: 1 cup soup with cheese added with slice toast with butter S: yogurt with granola and craisins D:  Meat, 2 starch, veg (with fat) S: apple with peanut butter, yogurt

## 2015-03-06 NOTE — Progress Notes (Signed)
Appointment start time: 0800  Appointment end time: 0845  Patient was seen on 03/05/15 for nutrition counseling pertaining to disordered eating  Primary care provider: Dr. Moshe Cipro Therapist: Alvis Lemmings Any other medical team members: Dr. Excell Seltzer, bariatric surgeon; Dr. Adele Schilder, psychiatrist; Dr. Chalmers Cater, endocrinologist   Assessment: Katelyn [name] states she knows she gained weight as she went to the doctor yesterday.  She states she is "surprisingly ok with it."  She knows she gained weight from the binging she's been doing.  She wishes she could "get control" of her binging.  Reports no menstrual cycle since 01/22/15   Anthropometrics "Ideal body weight": 127 lb + 13 lb Ideal BMI: 20.9 BMI today: 20.9 % Ideal today:  100% Goal weight range based on previous anthropometrics: 145-155; 138-170 lb per [facility] Previous LMP at 150 lb Suspect ~5-10 lb more weight gain needed for normal BM and consistent menstruation and strength to combat cognitive distortion Goal rate of weight gain:  0.5 lb/week  [facility] Body Composition Results  10/17/2013 11/15/13 12/27/13 02/07/14 04/11/14 06/14/14 07/12/14 11/24/14 12/07/14 12/26/14 02/13/15 02/20/15 02/26/15 03/05/15  BMI (kg/m^2) 44 40.3 35.7 32.9 28.7 24.8 23.6 19.8 21.5 20.7 21.2 20.9 21.1 21.6  Fat Mass (lbs) 140 124.0 101.5 83 65.5 45.5 36 19 22.5 16.5 23.5 22.5 21.5 28  Fat Free Mass (lbs) 124.5 118.0 116.5 114.5 107 103.5 106 102 107 108 104 103 107.5 103.5  Total Body Water (lbs) 91 86.5 85.5 84 78.5 76 77.5 74.5 78.5 79 76 75.5 78.5 76  %Body Fat      30.7 25.3 15.6 17.3 13.4 18.4 17.8 16.6 21.3          Mental health diagnosis: anorexia nervosa.  B/P subtype  Dietary assessment: Discharge meal plan, per [name]: 2 dairy 3 fruit 8 starches 6 fat 6 pro 1 other 1 supplement 2-3 vegetable   24 hour recall-  B: egg and piece sausage S: greek yogurt, carrots, hummus L: salad with chicken S:  "binge" (yogurt, craisins, granola...goldfish, halloween candy, bariatric taco soup) D: apple with peanut butter, carrots with hummus, goldfish S: marshmellows and skinny cow ice cream sandwich    What Methods Do You Use To Control Your Weight (Compensatory behaviors)?           Restricting (calories, fat, carbs): restricting carbs and fat  Binging: daily   Estimated energy needs: BMR: 1333 kcal Maintenance needs: 1800 kcal Weight restoration at 0.5 lb/week: 2300 kcal 275 g CHO 120 g pro 80 g fat  Nutrition Diagnosis: NI-1.4 Inadequate energy intake As related to eating disorder.  As evidenced by consumption ~200 kcal/day vs 1800kcal recommended.  Intervention/Goals:   Discussed with Hailly reasons for binge behavior.  Minsa agrees it's most likely because she's restricting or thinking restrictive thoughts.  She is also mindless in her eating sometimes, which set her up for failure.  Discussed need for adequate carbs and dangers of restricting either total amount, or types of carbs as that "black and white" mentality is destructive.  She agreed.  Encouraged challenging cognitive distortions, having adequate carbs for "brain food" and following various organizations on social media for body positivity.  Explained how she did not gain weight while in treatment like she thought.  Encouraged her to surround herself with those positive messages to dilute the negative messages in her head.   She realizes on her own she needs more and that's why she's binging.      Monitoring and Evaluation: Patient will follow up in  1 weeks

## 2015-03-13 ENCOUNTER — Ambulatory Visit: Payer: Self-pay | Admitting: *Deleted

## 2015-03-15 DIAGNOSIS — R630 Anorexia: Secondary | ICD-10-CM | POA: Diagnosis not present

## 2015-03-15 DIAGNOSIS — F333 Major depressive disorder, recurrent, severe with psychotic symptoms: Secondary | ICD-10-CM | POA: Diagnosis not present

## 2015-03-15 DIAGNOSIS — F411 Generalized anxiety disorder: Secondary | ICD-10-CM | POA: Diagnosis not present

## 2015-03-16 ENCOUNTER — Encounter: Payer: Self-pay | Admitting: Neurology

## 2015-03-16 ENCOUNTER — Ambulatory Visit (INDEPENDENT_AMBULATORY_CARE_PROVIDER_SITE_OTHER): Payer: Managed Care, Other (non HMO) | Admitting: Neurology

## 2015-03-16 VITALS — BP 90/58 | HR 65 | Ht 65.5 in | Wt 134.3 lb

## 2015-03-16 DIAGNOSIS — R519 Headache, unspecified: Secondary | ICD-10-CM

## 2015-03-16 DIAGNOSIS — G43009 Migraine without aura, not intractable, without status migrainosus: Secondary | ICD-10-CM | POA: Diagnosis not present

## 2015-03-16 DIAGNOSIS — R51 Headache: Secondary | ICD-10-CM | POA: Diagnosis not present

## 2015-03-16 MED ORDER — TOPIRAMATE 100 MG PO TABS: 100.0000 mg | ORAL_TABLET | Freq: Two times a day (BID) | ORAL | Status: DC

## 2015-03-16 MED ORDER — ONDANSETRON HCL 4 MG PO TABS: 4.0000 mg | ORAL_TABLET | Freq: Three times a day (TID) | ORAL | Status: DC | PRN

## 2015-03-16 MED ORDER — CYCLOBENZAPRINE HCL 5 MG PO TABS: 5.0000 mg | ORAL_TABLET | Freq: Three times a day (TID) | ORAL | Status: DC | PRN

## 2015-03-16 NOTE — Patient Instructions (Addendum)
Reduce topamax to 50mg  in the morning and 100mg  at bedtime for one month.  If headaches are still well-controlled, reduce to 50mg  twice daily thereafter.  Return to clinic as needed

## 2015-03-16 NOTE — Progress Notes (Signed)
Follow-up Visit   Date: 03/16/2015    ZALEA GOVEA MRN: [id number] DOB: Mar 16, 1976   Interim History: Katelyn [name] is a 39 y.o. right-handed Caucasian female with history of history of diabetes, polycystic ovarian cystic syndrome, depression with psychosis, and mild OSA (not on CPAP) returning to the clinic for follow-up of chronic daily headaches.    History of present illness: Headaches started around 2012 after her ECT therapy (total of 12 sessions). She has no prior history of migraines or severe headaches prior to this. Headaches are described as "tightness, pressure" over the forehead. She has occasional radiation of pain to the back of her head. Headache is daily (ranked as 3/10), with varying intensity throughout the day. It is worsened by loud noises and stress. When severe, she has photophobia, nausea, and vomiting. Duration is 5-6 hours. Severe headaches occur once every 1-2 months. She is able to function with daily headaches, but with severe pain, she has to lay down. She has tried hot and cold compresses, ibuprofen, excedrin migraine, tylenol, and Aleve which does not help. She does not take anything for them anymore because nothing works. She was took fioricet which helped when she took two.  She reports snoring loudly and feeling tired in the morning.  Her psychiatrist also tried topamax 50mg  but there was no change in symptoms. Of all the things she has tried, she has felt the most relief with hot/cold compresses.  Of note, she takes ziprasidone 40 MG for depression with psychosis for a number of years and has generalized tremors.   She established care with me in December 2014 at which time I increased her topamax to 100mg /d, which significantly helped. Headaches were well-controlled on this dose until Spring 2016 when she underwent more ECT, so it was increased to 200mg /d.   - UPDATE 08/02/2014:  Patient scheduled sooner f/u appointment due to worsening headaches  since starting ECT in February which is helping her depression, but has noticed cognitive side effects and worsening of headaches. Her headaches are now daily and chronic in addition to migraines several times per week. Bifrontal headaches and throbbing.  She is unable to take NSAIDs due to gastric bypass, so has been treated with tylenol but there is no relief.  She continues to loose an astonishing 40+lb since her last visit!  - UPDATE 03/16/2015:  She stopped treatment with ECT over the summer because of issues related to her memory and headaches.  Headaches have improved since then, migraines once per month and tension headaches 2-3 times per week.  She usually does not treat these headaches.  She continues to have short-term memory, especially with names, dates, and events.     Medications:  Current Outpatient Prescriptions on File Prior to Visit  Medication Sig Dispense Refill  . clonazePAM (KLONOPIN) 0.5 MG tablet TAKE 1 TABLET BY MOUTH 3 TIMES DAILY. 90 tablet 0  . cyclobenzaprine (FLEXERIL) 5 MG tablet Take 1 tablet (5 mg total) by mouth every 8 (eight) hours as needed for muscle spasms. 20 tablet 3  . docusate sodium (COLACE) 50 MG capsule Take 50 mg by mouth 2 (two) times daily.    Marland Kitchen FLUoxetine (PROZAC) 20 MG capsule Take 1 capsule (20 mg total) by mouth daily. 90 capsule 0  . FLUoxetine (PROZAC) 40 MG capsule Take 1 capsule (40 mg total) by mouth daily. (Patient taking differently: Take 60 mg by mouth daily. ) 90 capsule 0  . hydrOXYzine (VISTARIL) 25 MG capsule Take  1 capsule (25 mg total) by mouth at bedtime. 30 capsule 0  . magnesium oxide (MAG-OX) 400 MG tablet Take 400 mg by mouth daily.    . Multiple Vitamin (MULTIVITAMIN WITH MINERALS) TABS tablet Take 1 tablet by mouth every morning.    . ondansetron (ZOFRAN) 4 MG tablet Take 1 tablet (4 mg total) by mouth every 8 (eight) hours as needed for nausea or vomiting. 20 tablet 3  . topiramate (TOPAMAX) 100 MG tablet Take 100 mg by  mouth 2 (two) times daily.     . ziprasidone (GEODON) 80 MG capsule Take 1 capsule (80 mg total) by mouth at bedtime. 90 capsule 0   No current facility-administered medications on file prior to visit.    Allergies:  Allergies  Allergen Reactions  . Dextrose     Anxiety due to gastric bypass  . Lithium Other (See Comments)    Catatonic state Catatonic state  . Penicillins     REACTION: Rash  . Penicillins Other (See Comments)    unknown  . Other Rash    Red rash if worn over long period Itching with epidural for c-section - pt thinks morphine was being given     Review of Systems:  CONSTITUTIONAL: No fevers, chills, night sweats,++ weight loss.   EYES: No visual changes or eye pain ENT: No hearing changes.  No history of nose bleeds.   RESPIRATORY: No cough, wheezing and shortness of breath.   CARDIOVASCULAR: Negative for chest pain, and palpitations.   GI: Negative for abdominal discomfort, blood in stools or black stools.  No recent change in bowel habits.   GU:  No history of incontinence.   MUSCLOSKELETAL: No history of joint pain or swelling.  No myalgias.   SKIN: Negative for lesions, rash, and itching.   ENDOCRINE: Negative for cold or heat intolerance, polydipsia or goiter.   PSYCH:  + depression or anxiety symptoms.   NEURO: As Above.   Vital Signs:  BP 90/58 mmHg  Pulse 65  Ht 5' 5.5" (1.664 m)  Wt 134 lb 5 oz (60.924 kg)  BMI 22.00 kg/m2  SpO2 99%  Neurological Exam: MENTAL STATUS including orientation to time, place, person, recent and remote memory, attention span and concentration, language, and fund of knowledge is normal.  Blunted affect, but smiles appropriate.   CRANIAL NERVES:  Face is symmetric.  MOTOR:  Motor strength is 5/5 in all extremities.  COORDINATION/GAIT: Gait narrow based and stable.   Data: Lab Results  Component Value Date   [id number] 12/27/2014   Lab Results  Component Value Date   TSH 1.155 12/27/2014   MRI brain  12/2010: Normal examination. No pituitary macroadenoma. No diagnosable microadenoma.    IMPRESSION/PLAN: 1.  Chronic daily headaches, improved since stopping ECT but will with short-term memory problems  - Reduce topamax to 50mg  in the morning and 100mg  at bedtime for a month and then 50mg  twice daily, if headaches are still controlled.  Monitor for signs of cognitive improvement although patient feels this is moreso due to ECT   - Continue taking magnesium oxide 400-600mg  daily  2.  Migraine without aura, stable  - Continue zofran 4mg  and flexeril 5mg  prn for migraine which provides relief  - Unable to take NSAIDs due to gastric bypass  3.  Depression with psychosis, followed by Dr. Adele Schilder  4.  S/p gastric bypass with significant weight loss, unable to take extended release medication   Return to clinic as needed  The  duration of this appointment visit was 25 minutes of face-to-face time with the patient.  Greater than 50% of this time was spent in counseling, explanation of diagnosis, planning of further management, and coordination of care.   Thank you for allowing me to participate in patient's care.  If I can answer any additional questions, I would be pleased to do so.    Sincerely,    Wilmont Olund K. Posey Pronto, DO

## 2015-03-20 ENCOUNTER — Ambulatory Visit: Payer: Self-pay | Admitting: *Deleted

## 2015-03-27 ENCOUNTER — Ambulatory Visit: Payer: Self-pay | Admitting: *Deleted

## 2015-03-29 DIAGNOSIS — F411 Generalized anxiety disorder: Secondary | ICD-10-CM | POA: Diagnosis not present

## 2015-03-29 DIAGNOSIS — R630 Anorexia: Secondary | ICD-10-CM | POA: Diagnosis not present

## 2015-03-29 DIAGNOSIS — F333 Major depressive disorder, recurrent, severe with psychotic symptoms: Secondary | ICD-10-CM | POA: Diagnosis not present

## 2015-04-04 ENCOUNTER — Ambulatory Visit: Payer: Self-pay | Admitting: *Deleted

## 2015-04-04 ENCOUNTER — Ambulatory Visit: Payer: Self-pay | Admitting: Neurology

## 2015-04-10 ENCOUNTER — Encounter: Payer: Self-pay | Admitting: *Deleted

## 2015-04-10 ENCOUNTER — Ambulatory Visit (INDEPENDENT_AMBULATORY_CARE_PROVIDER_SITE_OTHER): Payer: Managed Care, Other (non HMO) | Admitting: Psychiatry

## 2015-04-10 ENCOUNTER — Encounter: Payer: Managed Care, Other (non HMO) | Attending: Family Medicine | Admitting: *Deleted

## 2015-04-10 ENCOUNTER — Encounter (HOSPITAL_COMMUNITY): Payer: Self-pay | Admitting: Psychiatry

## 2015-04-10 VITALS — Ht 65.5 in | Wt 139.5 lb

## 2015-04-10 VITALS — BP 96/60 | HR 76 | Ht 65.5 in | Wt 140.4 lb

## 2015-04-10 DIAGNOSIS — Z713 Dietary counseling and surveillance: Secondary | ICD-10-CM | POA: Diagnosis not present

## 2015-04-10 DIAGNOSIS — F419 Anxiety disorder, unspecified: Secondary | ICD-10-CM

## 2015-04-10 DIAGNOSIS — F331 Major depressive disorder, recurrent, moderate: Secondary | ICD-10-CM

## 2015-04-10 DIAGNOSIS — F509 Eating disorder, unspecified: Secondary | ICD-10-CM

## 2015-04-10 MED ORDER — FLUOXETINE HCL 20 MG PO CAPS: ORAL_CAPSULE | ORAL | Status: DC

## 2015-04-10 MED ORDER — ZIPRASIDONE HCL 60 MG PO CAPS
ORAL_CAPSULE | ORAL | Status: DC
Start: 2015-04-10 — End: 2015-05-10

## 2015-04-10 NOTE — Progress Notes (Signed)
Appointment start time: 0800  Appointment end time: 0900  Patient was seen on 04/10/15 for nutrition counseling pertaining to disordered eating  Primary care provider: Dr. Moshe Cipro Therapist: Gracelyn Nurse, primary.  Needs new eating disorder therapist Any other medical team members: Dr. Excell Seltzer, bariatric surgeon; Dr. Adele Schilder, psychiatrist; Dr. Chalmers Cater, endocrinologist   Assessment:  Sheneka has not been seen by this provider in 1 month.  She has stopped seeing [employer] as the out of pocket expense was too great.  She has an appointment with Dr. Ardath Sax scheduled.  Realizes her eating disorder voice was really loud.  Thought she was "better."  Wasn't following any one's advice and felt like it was a waste of time.  Considered going back to bariatric RD Is weighing hreself at home, "not obsessively" but knows it can go there.  Restricts during the morning and then binges at night.  She's at 140 lb and feels that is the breaking point; that she needs to make changes.   Doesn't remember exchange system from previous treatment and would like a new meal plan based on her current needs  Did resume menses.  Has now had 3 consecutive cycles.   Has been experiencing hypoglycemia  (reactive??) once a week.  Thinks this is because she is mostly eating refined carbohydrates without protein now fat.  She knows she's been restricting in the mornings and that leads to binging in the afternoons.     Anthropometrics "Ideal body weight": 127 lb + 13 lb Ideal BMI: 20.9 BMI today: 23.2 % Ideal today:  100+% Goal weight range based on previous anthropometrics: 145-155; 138-170 lb per [facility] No further weight gain needed  [place] Body Composition Results  10/17/2013 11/15/13 12/27/13 02/07/14 04/11/14 06/14/14 07/12/14 11/24/14 12/07/14 12/26/14 02/13/15 02/20/15 02/26/15 03/05/15 04/10/15  BMI (kg/m^2) 44 40.3 35.7 32.9 28.7 24.8 23.6 19.8 21.5 20.7 21.2 20.9 21.1 21.6 23.2  Fat Mass  (lbs) 140 124.0 101.5 83 65.5 45.5 36 19 22.5 16.5 23.5 22.5 21.5 28 33  Fat Free Mass (lbs) 124.5 118.0 116.5 114.5 107 103.5 106 102 107 108 104 103 107.5 103.5 106.5  Total Body Water (lbs) 91 86.5 85.5 84 78.5 76 77.5 74.5 78.5 79 76 75.5 78.5 76 78  %Body Fat      30.7 25.3 15.6 17.3 13.4 18.4 17.8 16.6 21.3 23.5          Mental health diagnosis: anorexia nervosa.  B/P subtype  Dietary assessment: high carbs Past 2  Days has been eating more normally (she thinks this is because maybe because she's on her period?)   24 hour recall B: not sure (today had 1/2 large muffin) S: broccoli and hummus L: hamburger helper with corn an dover baked fries S: dont' remember D: frozen dinner; chicken low mein S: swiss cake roll Beverages: crystla light, unsweet tea, coffee  Physical activity: none   What Methods Do You Use To Control Your Weight (Compensatory behaviors)?           Restricting (calories, fat, carbs): restricting carbs and fat  Binging: daily   Estimated energy needs: BMR: 1386 kcal Maintenance needs: 1800 kcal 225 g CHO 90 g pro 60 g fat  Nutrition Diagnosis: NI-1.4 Inadequate energy intake As related to eating disorder.  As evidenced by consumption ~200 kcal/day vs 1800kcal recommended.  Intervention/Goals:   Recommended 2 additional therapist who accept her insurance that this provider has worked with in the past: Ed Blalock and Jeremy Johann as this  provider does not align treatment idealogies with Dr. Ardath Sax and collaboration and cohesion is important for recovery and patient care.   Discussed with Mili reasons for binge behavior: restricting total food or restricting types of food.  Reviewed reasons for recovery.  Discussed eating disorder voice is deceiving.  Praised her renewed desire for recovery.    New meal plan: Dairy: 3 Fruit: 3 Veg: 3-4 Starch: 8 Pro: 5 Fat: 6  B: 2 starch, 1 dairy, 1 fruit, 1 pro, 1 fat S: 1 starch,  1 fruit, 1 fat L: 2 starch,  1 veg, 2 pro, 1 fat S: 1 starch,  1 dairy, 1 fat D: 2 starch, 2 veg, 2 pro, 1 fat S: 1 fruit, 1 dairy, 1 fat  B: 2 servings cereal with 1 cup soy milk, prunes, 2 [place] sausage links S: apple with 2 tbsp peanut butter and 1 tbsp honey drizzle L: 2 oz [place] sandwich with mustard, 1 cup carrots, 2 tbsp hummus S: greek yogurt with 1/4 cup granola (regular, not low fat) D: 2 oz meat, 2/3 cup cooked pasta, 1 cup cooked veggies, cooked in oil S: cheesestick, 1 small or 1/2 large banana and peanut butter Beverages: at least 4 cups water, plus whatever else   Why I want to recover: Want to be comfortable in my body Want to be healthy Want to be able to eat with family Want my body to function properly   Monitoring and Evaluation: Patient will follow up in 1 weeks

## 2015-04-10 NOTE — Patient Instructions (Addendum)
New meal plan: Dairy: 3 Fruit: 3 Veg: 3-4 Starch: 8 Pro: 5 Fat: 6  B: 2 starch, 1 dairy, 1 fruit, 1 pro, 1 fat S: 1 starch, 1 fruit, 1 fat L: 2 starch,  1 veg, 2 pro, 1 fat S: 1 starch,  1 dairy, 1 fat D: 2 starch, 2 veg, 2 pro, 1 fat S: 1 fruit, 1 dairy, 1 fat  B: 2 servings cereal with 1 cup soy milk, prunes, 2 [place] sausage links S: apple with 2 tbsp peanut butter and 1 tbsp honey drizzle L: 2 oz [place] sandwich with mustard, 1 cup carrots, 2 tbsp hummus S: greek yogurt with 1/4 cup granola (regular, not low fat) D: 2 oz meat, 2/3 cup cooked pasta, 1 cup cooked veggies, cooked in oil S: cheesestick, 1 small or 1/2 large banana and peanut butter Beverages: at least 4 cups water, plus whatever else  This is what I need to maintain, not gain.  This is what will protect me from binging in the afternoons!!!  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    Why I want to recover: Want to be comfortable in my body Want to be healthy Want to be able to eat with family Want my body to function properly

## 2015-04-10 NOTE — Progress Notes (Signed)
[name] Progress Note  [name] [id number] 39 y.o.  04/10/2015 9:44 AM  Chief Complaint:  I'm seeing nutritionist every week.  I'm eating better I still have a lot of anxiety and I continued to hear voices.                         History of Present Illness:  Katelyn [name] came for her appointment.  She is taking her medication as prescribed however she takes Klonopin only as needed.  She is seeing nutritionist every week and her weight is increased from the past.  She is not happy about it but she admitted her energy level is much better.  She endorsed passive and fleeting suicidal thoughts but denies any plan.  She continued to hear voices but also endorsed the are stable.  She still have tremors and shakes and we had tried Vistaril but she does not see any improvement.  She tried to cut down her Topamax but her headaches got worse.  She is no longer seeing Katelyn [name] due to conflict of appointments.  She is seeing Katelyn [name] for CBT.  At this time she does not want to pursue ECT as she feel that her depression is stable.  She endorse husband is more involved and supportive.  Her parents are also came back who are out of the town.  She has gained 4 pounds from the last visit.  She is seeing primary care physician.  She denies any agitation or any anger and denies any feeling of hopelessness or worthlessness.  She used to take a higher dose of Geodon which help her hallucination but decided to cut down on her own.  Now she is willing to go back on a higher dose of Geodon again.  Patient denies drinking or using any illegal substances.  She is planning to have Christmas at her parent's house were very supportive.  Her appetite is okay and she gained weight from the past.  She still have mild tremors.  Patient has genetic testing which shows that she can take Wellbutrin, Pristiq, Fetzima, selgline and viibryd if needed.  Suicidal Ideation: Passive and fleeting Thoughts but no  plan or intent Plan Formed: No Patient has means to carry out plan: No  Homicidal Ideation: No Plan Formed: No Patient has means to carry out plan: No  Review of Systems  Constitutional: Negative for weight loss and malaise/fatigue.  Cardiovascular: Negative for chest pain.  Gastrointestinal: Negative for constipation.  Skin: Negative for itching and rash.  Neurological: Positive for tremors and headaches. Negative for dizziness and tingling.  Psychiatric/Behavioral: Positive for hallucinations. Negative for depression, suicidal ideas and substance abuse. The patient is nervous/anxious. The patient does not have insomnia.     Psychiatric: Agitation: No Hallucination:  complain of auditory hallucination.   Depressed Mood: no Insomnia: No Hypersomnia: No Altered Concentration: No Feels Worthless: No Grandiose Ideas: No Belief In Special Powers: No New/Increased Substance Abuse: No Compulsions: No  Neurologic: Headache: Yes Seizure: No Paresthesias: No  Psychosocial history Patient lives with her husband and children.  Her parents are very supportive who live close by.    Medical history Patient has history of polycystic ovary disease, obesity, headache and chronic pain.  Patient has gastric bypass surgery.  Her primary care physician is Dr. Tula [name].  Outpatient Encounter Prescriptions as of 04/10/2015  Medication Sig  . clonazePAM (KLONOPIN) 0.5 MG tablet TAKE 1 TABLET BY MOUTH 3 TIMES  DAILY.  . cyclobenzaprine (FLEXERIL) 5 MG tablet Take 1 tablet (5 mg total) by mouth every 8 (eight) hours as needed for muscle spasms.  Marland [name] docusate sodium (COLACE) 50 MG capsule Take 50 mg by mouth 2 (two) times daily.  Marland [name] FLUoxetine (PROZAC) 20 MG capsule Take 3 capsule daily  . magnesium oxide (MAG-OX) 400 MG tablet Take 400 mg by mouth daily.  . Multiple Vitamin (MULTIVITAMIN WITH MINERALS) TABS tablet Take 1 tablet by mouth every morning.  . ondansetron (ZOFRAN) 4 MG tablet Take 1  tablet (4 mg total) by mouth every 8 (eight) hours as needed for nausea or vomiting.  . topiramate (TOPAMAX) 100 MG tablet Take 1 tablet (100 mg total) by mouth 2 (two) times daily.  . ziprasidone (GEODON) 60 MG capsule Take 2 capsule daily  . [DISCONTINUED] FLUoxetine (PROZAC) 20 MG capsule Take 1 capsule (20 mg total) by mouth daily.  . [DISCONTINUED] FLUoxetine (PROZAC) 40 MG capsule Take 1 capsule (40 mg total) by mouth daily. (Patient taking differently: Take 60 mg by mouth daily. )  . [DISCONTINUED] hydrOXYzine (VISTARIL) 25 MG capsule Take 1 capsule (25 mg total) by mouth at bedtime.  . [DISCONTINUED] ziprasidone (GEODON) 80 MG capsule Take 1 capsule (80 mg total) by mouth at bedtime.   No facility-administered encounter medications on file as of 04/10/2015.   Recent Results (from the past 2160 hour(s))  TB Skin Test     Status: None   Collection Time: 01/11/15  4:31 PM  Result Value Ref Range   TB Skin Test Negative    Induration 0 mm     Past Psychiatric History/Hospitalization(s): Patient has at least 8 psychiatric inpatient treatment.  Her last admission was in February 2015 .  In the past she had tried good response with ECT however she scared to try ECT.  In the past she had tried Cymbalta, Lexapro, Abilify, lithium, Wellbutrin, Lamictal, Ritalin,  Remeron , Risperdal, Neurontin, Vistaril , BuSpar and Valium.     Anxiety: Yes Bipolar Disorder: No Depression: Yes Mania: No Psychosis: Yes Schizophrenia: No Personality Disorder: No Hospitalization for psychiatric illness: Yes History of Electroconvulsive Shock Therapy: Yes Prior Suicide Attempts: No  Physical Exam: Constitutional:  BP 96/60 mmHg  Pulse 76  Ht 5' 5.5" (1.664 m)  Wt 140 lb 6.4 oz (63.685 kg)  BMI 23.00 kg/m2  LMP 04/09/2015  General Appearance: Anxious  Musculoskeletal: Strength & Muscle Tone: within normal limits Gait & Station: normal Patient leans: N/A  Mental status examination Patient is  casually dressed and groomed.  She is anxious but cooperative.  She maintained good eye contact.  Her speech is soft clear and coherent.  She described her mood anxious and her affect is appropriate.  Her attention and concentration is fair.   she has mild tremors in her psychomotor activity is slightly increased.  She admitted having hallucinations which are nonspecific.  She denies any visual hallucinations.  She denies any paranoia, delusions or any obsessive thoughts.  There were no flight if ideas or any loose association. Her fund of knowledge is adequate.  Her memory is grossly normal.  She is alert and oriented x3.  Her insight judgment and impulse control is okay.  Assessment: Axis I: Maj. depressive disorder with psychotic features, generalized anxiety disorder, eating disorder,  Axis II: Deferred  Axis III: Patient Active Problem List   Diagnosis Date Noted  . Medicare annual wellness visit, initial 01/01/2015  . Encounter for examination for admission to residential institution  12/28/2014  . Severe recurrent major depression without psychotic features ([place])   . Sleep disorder 06/01/2013  . Generalized anxiety disorder 05/30/2013  . Chronic headaches 07/01/2012  . Depression, major, recurrent, severe with psychosis ([place]) 06/17/2012  . Family hx colonic polyps 03/03/2012  . Eating disorder 12/18/2011  . POLYCYSTIC OVARIAN DISEASE 09/01/2008    Plan: I review her previous records,  notes from nutritionist , current medication and her treatment plan.  She is following a meal plan since she left from  .  She had gained 4 pounds since the last visit.  Patient also feels relieved and pleased with her progress.   She is not involved in any binge.  I would discontinue Vistaril since it did not help her tremors.  Recommended to increase Geodon 120 mg at bedtime to help hallucinations.  She is in the process of getting a new therapist for her eating disorder since her previous  therapist had a [name] is not available for appointments.  She will continue seeing [name] for [facility]BT.  Continue Prozac 60 mg daily.  She does not need a new prescription of Klonopin since she is using only as needed.  Encouraged to keep appointment with nutritionist every week for her meal plan. Recommended to call us back if she has any question or any concern.  Discuss safety plan that anytime having active suicidal thoughts or homicidal thoughts and she need to call 911 or go to the local emergency room.  Time spent 25 minutes.  More than 50% of the time spent in psychoeducation, counseling and coordination of care.  Patient is taking Topamax from Dr Romelle Starcher.    Katelyn Avedisian T., MD 04/10/2015

## 2015-04-16 ENCOUNTER — Ambulatory Visit (INDEPENDENT_AMBULATORY_CARE_PROVIDER_SITE_OTHER): Payer: Managed Care, Other (non HMO) | Admitting: Family Medicine

## 2015-04-16 ENCOUNTER — Ambulatory Visit: Payer: Self-pay | Admitting: *Deleted

## 2015-04-16 ENCOUNTER — Encounter: Payer: Self-pay | Admitting: Family Medicine

## 2015-04-16 VITALS — BP 118/78 | HR 68 | Resp 16 | Ht 65.5 in | Wt 141.4 lb

## 2015-04-16 DIAGNOSIS — F509 Eating disorder, unspecified: Secondary | ICD-10-CM | POA: Diagnosis not present

## 2015-04-16 DIAGNOSIS — E162 Hypoglycemia, unspecified: Secondary | ICD-10-CM | POA: Diagnosis not present

## 2015-04-16 DIAGNOSIS — E282 Polycystic ovarian syndrome: Secondary | ICD-10-CM | POA: Diagnosis not present

## 2015-04-16 LAB — GLUCOSE, POCT (MANUAL RESULT ENTRY): POC Glucose: 75 mg/dl (ref 70–99)

## 2015-04-16 NOTE — Assessment & Plan Note (Signed)
Recently completed in patient care for help with this with some success, however this continues to be a struggle, I suspect that the reported episodes of hypoglycemia have more of a psychiatric componenent than endo. She continues to follow with eating disorder specialist as she needs to

## 2015-04-16 NOTE — Assessment & Plan Note (Signed)
2 month h/o hypoglycemia, now experiences daily drops into the 40's and 50's , once down in 30's and unarousable this past Saturday , afraid even to drive ,

## 2015-04-16 NOTE — Patient Instructions (Signed)
F/u as before, call if you need me before  Take glucose tablets to use as a supplement when you feel as thouigh you need them ' Urgent referral to endo we will call with appt  Weightis VERY HEALTHY, so continue intensive therapy

## 2015-04-16 NOTE — Progress Notes (Signed)
   Subjective:    Patient ID: Katelyn [name], female    DOB: [birth year], 39 y.o.   MRN: [id number]  HPI Pt in today with c/o recurrent hypoglycemic episodes in the past 2 months. States blood sugar falls to the 50's if she does not eat on a 2 hr schedule, and that this past Saturday her blood sugar was in the 30's , her daughter found her and she was barely arousable In office today , 3 hrs after last meal blood sugar is checked twice and it is 75 and 80 respectively. States she is afraid even to drive  Due to hypoglycemia, states she is following dietary recommendations, awakens herself from sleep every 2 hrs to eat, feels that she is currently 10 pounds overweight though her weight is ideal She is ready to admit that her eating disorder is the cause of these feelings Questions the need for glucagon at home and wonders if she is diabetic, denies taking metformin at this time  Review of Systems See HPI Denies recent fever or chills. Denies sinus pressure, nasal congestion, ear pain or sore throat. Denies chest congestion, productive cough or wheezing. Denies chest pains, palpitations and leg swelling Denies abdominal pain, nausea, vomiting,diarrhea or constipation.   Denies dysuria, frequency, hesitancy or incontinence. Denies joint pain, swelling and limitation in mobility. Denies headaches, seizures, numbness, or tingling. Denies uncontrolled  depression, anxiety or insomnia. Denies skin break down or rash.        Objective:   Physical Exam BP 118/78 mmHg  Pulse 68  Resp 16  Ht 5' 5.5" (1.664 m)  Wt 141 lb 6.4 oz (64.139 kg)  BMI 23.16 kg/m2  SpO2 100%  LMP 04/09/2015 Patient alert and oriented and in no cardiopulmonary distress.  HEENT: No facial asymmetry, EOMI,   oropharynx pink and moist.  Neck supple no JVD, no mass.  Chest: Clear to auscultation bilaterally.  CVS: S1, S2 no murmurs, no S3.Regular rate.  ABD: Soft non tender.   Ext: No edema  MS: Adequate ROM  spine, shoulders, hips and knees.  Skin: Intact, no ulcerations or rash noted.  Psych: Good eye contact, normal affect. Memory intact not anxious or depressed appearing.  CNS: CN 2-12 intact, power,  normal throughout.no focal deficits noted.        Assessment & Plan:  Hypoglycemia 2 month h/o hypoglycemia, now experiences daily drops into the 40's and 50's , once down in 30's and unarousable this past Saturday , afraid even to drive ,   Eating disorder Recently completed in patient care for help with this with some success, however this continues to be a struggle, I suspect that the reported episodes of hypoglycemia have more of a psychiatric componenent than endo. She continues to follow with eating disorder specialist as she needs to

## 2015-04-24 ENCOUNTER — Encounter: Payer: Managed Care, Other (non HMO) | Admitting: *Deleted

## 2015-04-24 VITALS — Ht 65.5 in | Wt 142.0 lb

## 2015-04-24 DIAGNOSIS — F509 Eating disorder, unspecified: Secondary | ICD-10-CM

## 2015-04-24 DIAGNOSIS — E162 Hypoglycemia, unspecified: Secondary | ICD-10-CM

## 2015-04-24 NOTE — Progress Notes (Signed)
Appointment start time: 1500  Appointment end time: 1600  Patient was seen on 04/24/15 for nutrition counseling pertaining to disordered eating  Primary care provider: Dr. Moshe Cipro Therapist: Gracelyn Nurse, primary.  Made appointment with [name] for 1/23 Any other medical team members: Dr. Excell Seltzer, bariatric surgeon; Dr. Adele Schilder, psychiatrist; Dr. Chalmers Cater, endocrinologist   Assessment:  Katelyn [name] has not been seen by an eating disorder therapist in ~2 months. She has stopped seeing [employer] as the out of pocket expense was too great.  She has an appointment with [name] scheduled.  She confirms increased anxiety.  She complains of hypoglycemia regularly.  Saw PCP last week who referred her to endocrinology, Dr. Chalmers Cater last week and was instructed to limit carbohydrates to 30g 6 times/day to protect again reacive hypoglycemia.  Laurelee feels she needs to limit carbohydrates completely.  She is afraid to eat and is getting mixed messages from her providers.  She is very dissatisfied with her weight and her body.  She feels she is too heavy. headaches have been increasing in severity.  Anxiety is 8:10, higher than before.   Sees primary therapist every other week psychiatrist increased meds recently She has not followed a structured meal plan consistently in awhile.  She was restricting and then binging pretty regularly.  When she binges, she consumes refined carbohydrates  Anthropometrics "Ideal body weight": 127 lb + 13 lb Ideal BMI: 20.9 BMI today: 23.3 % Ideal today:  100+% Goal weight range based on previous anthropometrics: 145-155; 138-170 lb per [facility] No further weight gain needed  [place] Body Composition Results  10/17/2013 11/15/13 12/27/13 02/07/14 04/11/14 06/14/14 07/12/14 11/24/14 12/07/14 12/26/14 02/13/15 02/20/15 02/26/15 03/05/15 04/10/15 04/24/15  BMI (kg/m^2) 44 40.3 35.7 32.9 28.7 24.8 23.6 19.8 21.5 20.7 21.2 20.9 21.1 21.6 23.2 23.3  Fat Mass  (lbs) 140 124.0 101.5 83 65.5 45.5 36 19 22.5 16.5 23.5 22.5 21.5 28 33 30.5  Fat Free Mass (lbs) 124.5 118.0 116.5 114.5 107 103.5 106 102 107 108 104 103 107.5 103.5 106.5 111.5  Total Body Water (lbs) 91 86.5 85.5 84 78.5 76 77.5 74.5 78.5 79 76 75.5 78.5 76 78 81.5  %Body Fat      30.7 25.3 15.6 17.3 13.4 18.4 17.8 16.6 21.3 23.5 21.6          Mental health diagnosis: anorexia nervosa.  B/P subtype  Dietary assessment:   24 hour recall B: biscuit with grits, 1 egg, country ham L: none D: pork with mashed potatoes Beverages: decaff coffee, water   Physical activity: none   What Methods Do You Use To Control Your Weight (Compensatory behaviors)?           Restricting (calories, fat, carbs): restricting carbs and fat  Binging: daily   Estimated energy needs: BMR: 1386 kcal Maintenance needs: 1800 kcal 225 g CHO 90 g pro 60 g fat  Nutrition Diagnosis: NI-1.4 Inadequate energy intake As related to eating disorder.  As evidenced by consumption ~200 kcal/day vs 1800kcal recommended.  Intervention/Goals:    Reinforced messaging about normalized eating to protect against binging and reactive hypoglycemia.  She followed her meal plan months ago and no hypoglycemia.  The difference now is the binging.  Reinforced messages of balanced eating: fiber, protein, fat with all eating occasions. Endocrinology recommendations of 30 g x 6/day = 180 g carbohydrate total; this provider recommended 225 g carbohydrate total.  Created new meal plan honoring endocrinology recommendations.  Listened and validated her feelings.  Reiterated she still needs to eat adequately.  Restricting --> binging every day with her.  She must eat in order to maintain control.   New meal plan B:1 dairy, 1 fruit, 2 pro, 1 fat S: 1 starch, 1 fruit, 1 fat L: 2 starch,  1 veg, 2 pro, 1 fat S: 1 starch,  1 dairy, 1 fat D: 2 starch, 2 veg, 2 pro, 1 fat S: 1 starch, 1 dairy, 1  fat   Monitoring and Evaluation: Patient will follow up in 1 weeks

## 2015-04-24 NOTE — Patient Instructions (Addendum)
B:1 dairy, 1 fruit, 2 pro, 1 fat S: 1 starch, 1 fruit, 1 fat L: 2 starch,  1 veg, 2 pro, 1 fat S: 1 starch,  1 dairy, 1 fat D: 2 starch, 2 veg, 2 pro, 1 fat S: 1 starch, 1 dairy, 1 fat

## 2015-05-01 ENCOUNTER — Encounter: Payer: Self-pay | Admitting: *Deleted

## 2015-05-01 ENCOUNTER — Encounter: Payer: Managed Care, Other (non HMO) | Attending: Family Medicine | Admitting: *Deleted

## 2015-05-01 VITALS — Ht 65.5 in | Wt 138.0 lb

## 2015-05-01 DIAGNOSIS — Z713 Dietary counseling and surveillance: Secondary | ICD-10-CM | POA: Insufficient documentation

## 2015-05-01 DIAGNOSIS — F509 Eating disorder, unspecified: Secondary | ICD-10-CM

## 2015-05-01 NOTE — Progress Notes (Signed)
Appointment start time: 0800  Appointment end time: 0900  Patient was seen on 05/01/15 for nutrition counseling pertaining to disordered eating  Primary care provider: Dr. Moshe Cipro Therapist: Gracelyn Nurse, primary.  Made appointment with [name] for 1/23 Any other medical team members: Dr. Excell Seltzer, bariatric surgeon; Dr. Adele Schilder, psychiatrist; Dr. Chalmers Cater, endocrinologist   Assessment:  Kept a food log this past week.  Feels like she had some restricting and some binging behaviors.  Keeping the log was helpful, thought, per patient. Log reveals restricting leads to binging.  Log also reveals inappropriate treatment for hypoglycemia.  Jennell was not able to follow structured meal plan.  Most food is eaten in living room.  Binges are typically in bedroom, but she is trying to work on that   [employer] Composition Results  10/17/2013 11/15/13 12/27/13 04/11/14 07/12/14 11/24/14 12/07/14 02/13/15 02/20/15 02/26/15 03/05/15 04/10/15 04/24/15 04/30/14  BMI (kg/m^2) 44 40.3 35.7 28.7 23.6 19.8 21.5 21.2 20.9 21.1 21.6 23.2 23.3 22.6  Fat Mass (lbs) 140 124.0 101.5 65.5 36 19 22.5 23.5 22.5 21.5 28 33 30.5 34  Fat Free Mass (lbs) 124.5 118.0 116.5 107 106 102 107 104 103 107.5 103.5 106.5 111.5 104  Total Body Water (lbs) 91 86.5 85.5 78.5 77.5 74.5 78.5 76 75.5 78.5 76 78 81.5 76  %Body Fat     25.3 15.6 17.3 18.4 17.8 16.6 21.3 23.5 21.6 24.7          Mental health diagnosis: anorexia nervosa.  B/P subtype  What Methods Do You Use To Control Your Weight (Compensatory behaviors)?           Restricting (calories, fat, carbs): restricting carbs and fat  Binging: 2-3/week   Estimated energy needs: BMR: 1386 kcal Maintenance needs: 1800 kcal 225 g CHO 90 g pro 60 g fat  Nutrition Diagnosis: NI-1.4 Inadequate energy intake As related to eating disorder.  As evidenced by consumption ~200 kcal/day vs 1800kcal recommended.  Intervention/Goals:    Reinforced  messaging about normalized eating to protect against binging and reactive hypoglycemia.  She followed her meal plan months ago and no hypoglycemia.  The difference now is the binging.  Reinforced messages of balanced eating: fiber, protein, fat with all eating occasions. Endocrinology recommendations of 30 g x 6/day = 180 g carbohydrate total; this provider recommended 225 g carbohydrate total.  Created new meal plan honoring endocrinology recommendations.  Listened and validated her feelings.  Reiterated she still needs to eat adequately.  Restricting --> binging every day with her.  She must eat in order to maintain control.   New meal plan B:1 dairy, 1 fruit, 2 pro, 1 fat S: 1 starch, 1 fruit, 1 fat L: 2 starch,  1 veg, 2 pro, 1 fat S: 1 starch,  1 dairy, 1 fat D: 2 starch, 2 veg, 2 pro, 1 fat S: 1 starch, 1 dairy, 1 fat  Goals: Continue to keep food log, write down all beverages Write sign for bedroom Read Dr. Austin Miles books, Overcoming Binge Eating Consider not weighing until next week Think about spilled coffee cup next time something doesn't happen the way you want When you have a low blood sugar, treat with 15 g simple sugar: 3-4 hard candies, 8 sweet tarts/smarties, or 3-4 glucose tablets  Then 15 minutes later, eat something with protein and carb (1/2 [place] sandwich or peanut butter crackers, or the meal you haven't had yet ;-) Following the meal plan will protect form binges and from low blood  sugars!!  Monitoring and Evaluation: Patient will follow up in 1 weeks

## 2015-05-01 NOTE — Patient Instructions (Addendum)
Continue to keep food log, write down all beverages Write sign for bedroom Read Dr. Austin Miles books, Overcoming Binge Eating Consider not weighing until next week Think about spilled coffee cup next time something doesn't happen the way you want When you have a low blood sugar, treat with 15 g simple sugar: 3-4 hard candies, 8 sweet tarts/smarties, or 3-4 glucose tablets  Then 15 minutes later, eat something with protein and carb (1/2 [place] sandwich or peanut butter crackers, or the meal you haven't had yet ;-) Following the meal plan will protect form binges and from low blood sugars!!  You can do this!!! You did it before and you will do it again.  You are stronger than Ed

## 2015-05-04 DIAGNOSIS — F333 Major depressive disorder, recurrent, severe with psychotic symptoms: Secondary | ICD-10-CM | POA: Diagnosis not present

## 2015-05-04 DIAGNOSIS — F411 Generalized anxiety disorder: Secondary | ICD-10-CM | POA: Diagnosis not present

## 2015-05-10 ENCOUNTER — Ambulatory Visit (INDEPENDENT_AMBULATORY_CARE_PROVIDER_SITE_OTHER): Payer: Managed Care, Other (non HMO) | Admitting: Psychiatry

## 2015-05-10 ENCOUNTER — Encounter (HOSPITAL_COMMUNITY): Payer: Self-pay | Admitting: Psychiatry

## 2015-05-10 VITALS — BP 106/68 | HR 58 | Ht 65.5 in | Wt 143.8 lb

## 2015-05-10 DIAGNOSIS — F331 Major depressive disorder, recurrent, moderate: Secondary | ICD-10-CM | POA: Diagnosis not present

## 2015-05-10 DIAGNOSIS — F411 Generalized anxiety disorder: Secondary | ICD-10-CM | POA: Diagnosis not present

## 2015-05-10 MED ORDER — FLUOXETINE HCL 20 MG PO CAPS: ORAL_CAPSULE | ORAL | Status: DC

## 2015-05-10 MED ORDER — ZIPRASIDONE HCL 80 MG PO CAPS: 80.0000 mg | ORAL_CAPSULE | Freq: Every day | ORAL | Status: DC

## 2015-05-10 MED ORDER — CARBAMAZEPINE 100 MG PO CHEW: 100.0000 mg | CHEWABLE_TABLET | Freq: Two times a day (BID) | ORAL | Status: DC

## 2015-05-10 NOTE — Progress Notes (Signed)
[facility] [phone] Progress Note  Katelyn [name] [id number] 40 y.o.  05/10/2015 9:38 AM  Chief Complaint:  I was very depressed and having suicidal thoughts.  Her blood sugar dropped because I was careless about myself and I was feeling hopeless.  Now I am feeling better.  I cannot tolerate increase Geodon.  It was making me restless and I started having tremors.                          History of Present Illness:  Katelyn [name] came for her appointment.  She admitted last month she had severe episode of depression and prior to that she was somewhat manic and irritable.  She believe she crash into depression and started to having suicidal thoughts and not taking care of herself .  She is not sure what happened but she had a hypoglycemic event and her blood sugar dropped 30.  Since then she is watching her diet closely.  She is able to gain weight from the past.  She cannot tolerate higher dose of Geodon.  She reported it makes her restless, tremulous and liked to go back on 80 mg.  She is not taking Klonopin every day but does take Klonopin 2-3 times a week when she is very anxious and nervous.  She admitted not able to see [name] for therapy because she has to go to school as her son was victim of bullying by another Ship broker.  She admitted increased stress and depression at that time.  But overall her Christmas went well.  Her husband is very supportive.  Her husband locked all her medication and her medicine is supervised by her family members.  Though she denies any hallucination or any paranoia but admitted some time not taking the medication as prescribed.  She still have struggle accepting her psychiatric illness .  She admitted episodes of hopelessness and worthlessness.  Her energy level is better since she is watching her diet and able to gain weight.  She was seen by her primary care physician and there has been no significant changes.  Patient denies any irritability, anger, aggressive  behavior but does report some time mania and depression.  We talked about adding mood stabilizer since she cannot tolerate higher dose of Geodon.  In the past she had tried Lamictal, lithium but she had a limited response.  She does not want to pursue ECT at this time.  Patient has genetic testing which shows that she can take Wellbutrin, Pristiq, Fetzima, selgline and viibryd if needed.  However she is comfortable with Prozac.  Her tremors are less intense since she cut down Geodon.  Her appetite is fair.  She gained weight from the past.  She has bradycardia but she denies any dizziness, palpitation at this time.  Suicidal Ideation: No Plan Formed: No Patient has means to carry out plan: No  Homicidal Ideation: No Plan Formed: No Patient has means to carry out plan: No  Review of Systems  Constitutional: Negative for weight loss and malaise/fatigue.  Cardiovascular: Negative for chest pain.  Gastrointestinal: Negative for constipation.  Skin: Negative for itching and rash.  Neurological: Positive for tremors and headaches. Negative for dizziness and tingling.  Psychiatric/Behavioral: Positive for depression. Negative for suicidal ideas and substance abuse. The patient is nervous/anxious. The patient does not have insomnia.     Psychiatric: Agitation: No Hallucination:  Denies    Depressed Mood: no Insomnia: No Hypersomnia: No Altered  Concentration: No Feels Worthless: No Grandiose Ideas: No Belief In Special Powers: No New/Increased Substance Abuse: No Compulsions: No  Neurologic: Headache: Yes Seizure: No Paresthesias: No  Psychosocial history Patient lives with her husband and children.  Her parents are very supportive who live close by.    Medical history Patient has history of polycystic ovary disease, obesity, headache and chronic pain.  Patient has gastric bypass surgery.  Her primary care physician is Dr. [name].  Outpatient Encounter Prescriptions as of  05/10/2015  Medication Sig  . carbamazepine (TEGRETOL) 100 MG chewable tablet Chew 1 tablet (100 mg total) by mouth 2 (two) times daily.  . clonazePAM (KLONOPIN) 0.5 MG tablet TAKE 1 TABLET BY MOUTH 3 TIMES DAILY.  Marland Kitchen docusate sodium (COLACE) 50 MG capsule Take 50 mg by mouth 2 (two) times daily. Reported on 04/24/2015  . FLUoxetine (PROZAC) 20 MG capsule Take 3 capsule daily  . Magnesium 400 MG CAPS Take by mouth.  . Multiple Vitamin (MULTIVITAMIN WITH MINERALS) TABS tablet Take 1 tablet by mouth every morning.  . ondansetron (ZOFRAN) 4 MG tablet Take 1 tablet (4 mg total) by mouth every 8 (eight) hours as needed for nausea or vomiting.  . topiramate (TOPAMAX) 100 MG tablet Take 1 tablet (100 mg total) by mouth 2 (two) times daily.  . ziprasidone (GEODON) 80 MG capsule Take 1 capsule (80 mg total) by mouth at bedtime.  . [DISCONTINUED] cyclobenzaprine (FLEXERIL) 5 MG tablet Take 1 tablet (5 mg total) by mouth every 8 (eight) hours as needed for muscle spasms.  . [DISCONTINUED] FLUoxetine (PROZAC) 20 MG capsule Take 3 capsule daily  . [DISCONTINUED] magnesium oxide (MAG-OX) 400 MG tablet Take 400 mg by mouth daily.  . [DISCONTINUED] ziprasidone (GEODON) 60 MG capsule Take 2 capsule daily   No facility-administered encounter medications on file as of 05/10/2015.   Recent Results (from the past 2160 hour(s))  POCT glucose (manual entry)     Status: None   Collection Time: 04/16/15 10:46 AM  Result Value Ref Range   POC Glucose 75 70 - 99 mg/dl    Comment: random      Past Psychiatric History/Hospitalization(s): Patient has at least 8 psychiatric inpatient treatment.  Her last admission was in February 2015 .  In the past she had tried good response with ECT however she scared to try ECT.  In the past she had tried Cymbalta, Lexapro, Abilify, lithium, Wellbutrin, Lamictal, Ritalin,  Remeron , Risperdal, Neurontin, Vistaril , BuSpar and Valium.     Anxiety: Yes Bipolar Disorder: No Depression:  Yes Mania: No Psychosis: Yes Schizophrenia: No Personality Disorder: No Hospitalization for psychiatric illness: Yes History of Electroconvulsive Shock Therapy: Yes Prior Suicide Attempts: No  Physical Exam: Constitutional:  BP 106/68 mmHg  Pulse 58  Ht 5' 5.5" (1.664 m)  Wt 143 lb 12.8 oz (65.227 kg)  BMI 23.56 kg/m2  LMP 04/09/2015  General Appearance: Anxious  Musculoskeletal: Strength & Muscle Tone: within normal limits Gait & Station: normal Patient leans: N/A  Mental status examination Patient is casually dressed and groomed.  She maintained fair eye contact.  She described her mood anxious and depressed and her affect is constricted.  Her speech is slow, soft, clear and coherent.  Her attention and concentration is fair.   she has mild tremors in her psychomotor activity is slightly decreased. She denies any auditory or visual hallucination at this time.  However she admitted some time paranoid about taking medication.  There were no delusions  or any obsessive thoughts.  There were no flight if ideas or any loose association. Her fund of knowledge is adequate.  Her memory is grossly normal.  She is alert and oriented x3.  Her insight judgment is fair and her impulse control is okay.    Assessment: Axis I: Maj. depressive disorder with psychotic features, generalized anxiety disorder, eating disorder,  Axis II: Deferred  Axis III: Patient Active Problem List   Diagnosis Date Noted  . Hypoglycemia 04/16/2015  . Medicare annual wellness visit, initial 01/01/2015  . Encounter for examination for admission to residential institution 12/28/2014  . Severe recurrent major depression without psychotic features ([place])   . Sleep disorder 06/01/2013  . Generalized anxiety disorder 05/30/2013  . Chronic headaches 07/01/2012  . Depression, major, recurrent, severe with psychosis ([place]) 06/17/2012  . Family hx colonic polyps 03/03/2012  . Eating disorder 12/18/2011  . POLYCYSTIC  OVARIAN DISEASE 09/01/2008    Plan: I review her previous records,  notes from her primary care physician , lab results current medication and her treatment plan.  She is following a meal plan and overall she has gained weight .  Lately she has been not involved in any purging or vomiting.  However she continues to struggle accepting her psychiatric illness .  She does not want to take a higher dose of Geodon due to tremors, restlessness.  I talk in detail about adding low-dose mood stabilizer.  In the past she had taken lithium and Lamictal with limited response.  I offer Depakote or Tegretol and patient agreed to try Tegretol a low dose.  I also encouraged to see [name] on a regular basis for coping skills.  I will continue Prozac 60 mg daily however reduce Geodon 80 mg at bedtime and we will start Tegretol 100 mg twice a day.  She is taking Klonopin 2-3 times a week for severe anxiety and nervousness.  Discuss in length medication side effects and benefits.  Recommended to call us back if she has any question or any concern.  Discuss safety plan that anytime having active suicidal thoughts or homicidal thoughts and she need to call 911 or go to the local emergency room.  Follow-up in 4 weeks Time spent 25 minutes.  More than 50% of the time spent in psychoeducation, counseling and coordination of care.  Patient is taking Topamax from Dr Romelle Starcher.  Patient does not want to cut down Topamax at this time.    Janiaya Ryser T., MD 05/10/2015

## 2015-05-11 ENCOUNTER — Ambulatory Visit: Payer: Self-pay | Admitting: *Deleted

## 2015-05-15 ENCOUNTER — Ambulatory Visit: Payer: Self-pay | Admitting: *Deleted

## 2015-05-18 ENCOUNTER — Ambulatory Visit: Payer: Self-pay | Admitting: *Deleted

## 2015-05-18 DIAGNOSIS — R630 Anorexia: Secondary | ICD-10-CM | POA: Diagnosis not present

## 2015-05-18 DIAGNOSIS — F411 Generalized anxiety disorder: Secondary | ICD-10-CM | POA: Diagnosis not present

## 2015-05-18 DIAGNOSIS — F333 Major depressive disorder, recurrent, severe with psychotic symptoms: Secondary | ICD-10-CM | POA: Diagnosis not present

## 2015-05-22 ENCOUNTER — Encounter: Payer: Managed Care, Other (non HMO) | Admitting: *Deleted

## 2015-05-22 ENCOUNTER — Encounter: Payer: Self-pay | Admitting: *Deleted

## 2015-05-22 VITALS — Ht 65.5 in | Wt 144.0 lb

## 2015-05-22 DIAGNOSIS — F509 Eating disorder, unspecified: Secondary | ICD-10-CM

## 2015-05-22 NOTE — Progress Notes (Signed)
Appointment start time: 0800  Appointment end time: 0845  Patient was seen on 05/22/15 for nutrition counseling pertaining to disordered eating  Primary care provider: Dr. Moshe Cipro Therapist: Gracelyn Nurse, primary.  No ED therapist in many months Any other medical team members: Dr. Excell Seltzer, bariatric surgeon; Dr. Adele Schilder, psychiatrist; Dr. Chalmers Cater, endocrinologist   Assessment:  Feels like she isn't "eating like I should."  Has been binging.  Eats like she "is supposed to" and then binges in the evening.  Reports emotional eating.  Did not see Gwen Auel yesterday as insurance will not cover 2 weekly therapists.    "safe carbs"  -tortilla -fruit -granola -popcorn -quest bar -shake with carb   24 hour recall B: protein shake (inspire) at 6 am 9 am:1/4 cup cottage cheese, boiled eggs, piece of fruit L: large salad with chicken, beans, avocado with ranch S: light greek yogurt with PB 2 D: chicken enchilada with rice  Binge episodes: daily in the evening after dinner.  Food varies: cereal or another carb, milkshake or ice cream, cheese and crackers, bagel with cream cheese  Living room typically   Eats less in bedroom now, just spends less time there.    Yesterday she was anxious and ate candy throughout the day in her bed    Mental health diagnosis: anorexia nervosa.  B/P subtype  What Methods Do You Use To Control Your Weight (Compensatory behaviors)?           Restricting (calories, fat, carbs): restricting carbs and fat  Binging: 2-3/week   Estima1400 kcal Maintenance needs: 1800 kcal 225 g CHO 90 g pro 60 g fat  Nutrition Diagnosis: NI-1.4 Inadequate energy intake As related to eating disorder.  As evidenced by consumption ~200 kcal/day vs 1800kcal recommended.  Intervention/Goals:    Reinforced messaging about normalized eating to protect against binging and reactive hypoglycemia.  She followed her meal plan months ago and no hypoglycemia.  The difference now is  the binging.  Reinforced messages of balanced eating: fiber, protein, fat with all eating occasions. Endocrinology recommendations of 30 g x 6/day = 180 g carbohydrate total; this provider recommended 225 g carbohydrate total.  Created new meal plan honoring endocrinology recommendations.  Listened and validated her feelings.  Reiterated she still needs to eat adequately.  Restricting --> binging every day with her.  She must eat in order to maintain control.  Coping skills:  Opposite action- "fake it till you make it"  Do whatever Ed tells you not to do Remember that this is what my body NEEDS .  Food is medicine.  presciption medicine doesn't work as well without adequate nutrition.  Food restriction increases psychological challenges.   Eating enough during the day protects against binging in the evening.    Think about what you could be doing free from Ed thoughts  Consider seeing friends outside of bariatric support group and consider not going to supoprt group Consider registering for [employer] group instead   Add carb to all eating occasions:   Fruit or tortilla or popcorn, granola, quest bar, shake (glucerna or boost glucose control)  Breakfast on the go could be quest bar and greek yogurt Lunch on the go: salad with fruit on it  Try "no weigh weekends." Consider if things are helpful or hurtful towards your recovery.  If it's hurtful- get rid of it!  Monitoring and Evaluation: Patient will follow up in 2 weeks.  Advised weekly appointments

## 2015-05-22 NOTE — Patient Instructions (Addendum)
Coping skills:  Opposite action- "fake it till you make it"  Do whatever Ed tells you not to do Remember that this is what my body NEEDS .  Food is medicine.  presciption medicine doesn't work as well without adequate nutrition.  Food restriction increases psychological challenges.   Eating enough during the day protects against binging in the evening.    Think about what you could be doing free from Ed thoughts  Consider seeing friends outside of bariatric support group and consider not going to supoprt group Consider registering for Body Works group instead   Add carb to all eating occasions:   Fruit or tortilla or popcorn, granola, quest bar, shake (glucerna or boost glucose control)  Breakfast on the go could be quest bar and greek yogurt Lunch on the go: salad with fruit on it  Try "no weigh weekends." Consider if things are helpful or hurtful towards your recovery.  If it's hurtful- get rid of it!

## 2015-05-24 DIAGNOSIS — R630 Anorexia: Secondary | ICD-10-CM | POA: Diagnosis not present

## 2015-05-24 DIAGNOSIS — F411 Generalized anxiety disorder: Secondary | ICD-10-CM | POA: Diagnosis not present

## 2015-05-24 DIAGNOSIS — F333 Major depressive disorder, recurrent, severe with psychotic symptoms: Secondary | ICD-10-CM | POA: Diagnosis not present

## 2015-05-25 ENCOUNTER — Ambulatory Visit: Payer: Self-pay | Admitting: *Deleted

## 2015-05-29 ENCOUNTER — Ambulatory Visit (INDEPENDENT_AMBULATORY_CARE_PROVIDER_SITE_OTHER): Payer: Managed Care, Other (non HMO) | Admitting: Family Medicine

## 2015-05-29 ENCOUNTER — Encounter: Payer: Self-pay | Admitting: Family Medicine

## 2015-05-29 VITALS — BP 104/70 | HR 65 | Resp 16 | Ht 66.0 in | Wt 148.0 lb

## 2015-05-29 DIAGNOSIS — D649 Anemia, unspecified: Secondary | ICD-10-CM | POA: Diagnosis not present

## 2015-05-29 DIAGNOSIS — R519 Headache, unspecified: Secondary | ICD-10-CM

## 2015-05-29 DIAGNOSIS — R51 Headache: Secondary | ICD-10-CM

## 2015-05-29 DIAGNOSIS — F509 Eating disorder, unspecified: Secondary | ICD-10-CM

## 2015-05-29 DIAGNOSIS — E162 Hypoglycemia, unspecified: Secondary | ICD-10-CM

## 2015-05-29 DIAGNOSIS — G8929 Other chronic pain: Secondary | ICD-10-CM

## 2015-05-29 DIAGNOSIS — F411 Generalized anxiety disorder: Secondary | ICD-10-CM

## 2015-05-29 DIAGNOSIS — F333 Major depressive disorder, recurrent, severe with psychotic symptoms: Secondary | ICD-10-CM

## 2015-05-29 NOTE — Patient Instructions (Signed)
F/u in 4.5 month, call if you need me sooner  Thankful that you are improving, continue to remind  Yourself how important it is to stay motivated to care for yourself  Labs to be drawn when you are next getting them for Dr Michiel Sites  All the best!  Thanks for choosing [facility], we consider it a privelige to serve you.

## 2015-05-29 NOTE — Progress Notes (Signed)
   Subjective:    Patient ID: Katelyn [name], female    DOB: Sep 03, 1975, 40 y.o.   MRN: [id number]  HPI   Katelyn [name]     MRN: [id number]      DOB: October 19, 1975   HPI [name] is here for follow up and re-evaluation of chronic medical conditions, medication management and review of any available recent lab and radiology data.  Preventive health is updated, specifically  Cancer screening and Immunization.   Questions or concerns regarding consultations or procedures which the PT has had in the interim are  addressed. The PT denies any adverse reactions to current medications since the last visit.  There are no new concerns.  There are no specific complaints   ROS Denies recent fever or chills. Denies sinus pressure, nasal congestion, ear pain or sore throat. Denies chest congestion, productive cough or wheezing. Denies chest pains, palpitations and leg swelling Denies abdominal pain, nausea, vomiting,diarrhea or constipation.   Denies dysuria, frequency, hesitancy or incontinence. Denies joint pain, swelling and limitation in mobility. Denies uncontrolled  headaches, seizures, numbness, or tingling. Denies skin break down or rash.   PE  BP 104/70 mmHg  Pulse 65  Resp 16  Ht 5\' 6"  (1.676 m)  Wt 148 lb (67.132 kg)  BMI 23.90 kg/m2  SpO2 100%  Patient alert and oriented and in no cardiopulmonary distress.  HEENT: No facial asymmetry, EOMI,   oropharynx pink and moist.  Neck supple no JVD, no mass.  Chest: Clear to auscultation bilaterally.  CVS: S1, S2 no murmurs, no S3.Regular rate.  ABD: Soft non tender.   Ext: No edema  MS: Adequate ROM spine, shoulders, hips and knees.  Skin: Intact, no ulcerations or rash noted.  Psych: Good eye contact, flat  affect. Memory intact not anxious or depressed appearing.  CNS: CN 2-12 intact, power,  normal throughout.no focal deficits noted.   Assessment & Plan   Hypoglycemia recently evaluated by her endo and has  been counsel led as to the importance of eating regularly including complex carbs to reduce likelihood of hypoglycemia Less frequent episodes since last visit  Eating disorder Involved with therapist and nutritionist, and continues to struggle, but trying hard to remain engaged in the process Healthier weight at this visit. She is s/p bariatric surgery with excessive weight loss and the need to be institutionalized because of excessive weight loss   Generalized anxiety disorder Reports that currently improved, but states that 3 weeks ago she was doing poorly, especially with regard to her depression  Depression, major, recurrent, severe with psychosis Continues to fluctuate in stability, states that 3 weeks ago she was doing "very badly" has seen psych and improved since that time. Currently dealing with new stresses involved in raising a teen son  Chronic headaches Improved and less frequent , topamax has been beneficial as a prophylactic agent  Anemia Low ferritin howver iron is within normal, will update value with next lab draw, supplementation encouraged        Review of Systems     Objective:   Physical Exam        Assessment & Plan:

## 2015-06-01 ENCOUNTER — Ambulatory Visit: Payer: Self-pay | Admitting: *Deleted

## 2015-06-01 DIAGNOSIS — F333 Major depressive disorder, recurrent, severe with psychotic symptoms: Secondary | ICD-10-CM | POA: Diagnosis not present

## 2015-06-01 DIAGNOSIS — F411 Generalized anxiety disorder: Secondary | ICD-10-CM | POA: Diagnosis not present

## 2015-06-01 DIAGNOSIS — R630 Anorexia: Secondary | ICD-10-CM | POA: Diagnosis not present

## 2015-06-08 ENCOUNTER — Ambulatory Visit: Payer: Self-pay | Admitting: *Deleted

## 2015-06-08 ENCOUNTER — Ambulatory Visit (HOSPITAL_COMMUNITY): Payer: Self-pay | Admitting: Psychiatry

## 2015-06-08 DIAGNOSIS — F333 Major depressive disorder, recurrent, severe with psychotic symptoms: Secondary | ICD-10-CM | POA: Diagnosis not present

## 2015-06-08 DIAGNOSIS — F411 Generalized anxiety disorder: Secondary | ICD-10-CM | POA: Diagnosis not present

## 2015-06-08 DIAGNOSIS — R630 Anorexia: Secondary | ICD-10-CM | POA: Diagnosis not present

## 2015-06-10 ENCOUNTER — Encounter: Payer: Self-pay | Admitting: Family Medicine

## 2015-06-10 DIAGNOSIS — D509 Iron deficiency anemia, unspecified: Secondary | ICD-10-CM | POA: Insufficient documentation

## 2015-06-10 NOTE — Assessment & Plan Note (Signed)
Reports that currently improved, but states that 3 weeks ago she was doing poorly, especially with regard to her depression

## 2015-06-10 NOTE — Assessment & Plan Note (Signed)
Improved and less frequent , topamax has been beneficial as a prophylactic agent

## 2015-06-10 NOTE — Assessment & Plan Note (Signed)
recently evaluated by her endo and has been counsel led as to the importance of eating regularly including complex carbs to reduce likelihood of hypoglycemia Less frequent episodes since last visit

## 2015-06-10 NOTE — Assessment & Plan Note (Signed)
Continues to fluctuate in stability, states that 3 weeks ago she was doing "very badly" has seen psych and improved since that time. Currently dealing with new stresses involved in raising a teen son

## 2015-06-10 NOTE — Assessment & Plan Note (Signed)
Low ferritin howver iron is within normal, will update value with next lab draw, supplementation encouraged

## 2015-06-10 NOTE — Assessment & Plan Note (Signed)
Involved with therapist and nutritionist, and continues to struggle, but trying hard to remain engaged in the process Healthier weight at this visit. She is s/p bariatric surgery with excessive weight loss and the need to be institutionalized because of excessive weight loss

## 2015-06-12 ENCOUNTER — Encounter: Payer: Self-pay | Admitting: *Deleted

## 2015-06-12 ENCOUNTER — Encounter: Payer: Managed Care, Other (non HMO) | Attending: Family Medicine | Admitting: *Deleted

## 2015-06-12 DIAGNOSIS — F509 Eating disorder, unspecified: Secondary | ICD-10-CM

## 2015-06-12 DIAGNOSIS — Z713 Dietary counseling and surveillance: Secondary | ICD-10-CM | POA: Diagnosis not present

## 2015-06-12 NOTE — Patient Instructions (Addendum)
Practice self care by using your relaxation techniques and consider yoga or 20 minute walk Try not to eat at all in the bedroom Prevent binging by eating 6 small meals through out the day with carbs, protein, and fat Try to get as much rest as you Lucianne Lei Drink adequate fluids (64 ounces)  Consider [email or web address]   Or body image support group

## 2015-06-12 NOTE — Progress Notes (Signed)
Appointment start time: 1030  Appointment end time: 1100  Patient was seen on 06/12/15 for nutrition counseling pertaining to disordered eating  Primary care provider: Dr. Moshe Cipro Therapist: Gracelyn Nurse, primary.  No ED therapist in many months Any other medical team members: Dr. Excell Seltzer, bariatric surgeon; Dr. Adele Schilder, psychiatrist; Dr. Chalmers Cater, endocrinologist   Assessment:  Martin Majestic to see [facility] and enjoyed it.   Is signing up for DBT class.  Has not been pursuing eating disorder therapy for herself Feels like she is in a better place the past week.  Feels like she hasn't been "at war" this week.  Didn't think about food as much and that is the first time in a long time However, children are sick and son is having a lot of struggles.  She has not been taking care of herself  24 hour recall B: can't remember.  Today had leftover chicken and soleslaw L: egg McMuffin D: fried chicken, cole slaw S: strawberries Beverages: water, decaff tea, 1 cup decaff coffee  Didn't bear herself up over fried chicken No physical activity 4 meals/day the past week, 2 snacks/day Last binge Wednesday.  Can't remember what happened, but it hasn't happened since Blood sugars are improving.  Last hypoglycemia when binging Not sure what is still a struggle  Has been doing aromatherapy, distraction, mediatation to help relax Has been to yoga twice   Mental health diagnosis: anorexia nervosa.  B/P subtype   Estimated energy intake: unknown  Maintenance needs: 1800 kcal 225 g CHO 90 g pro 60 g fat  Nutrition Diagnosis: NI-1.4 Inadequate energy intake As related to eating disorder.  As evidenced by consumption ~200 kcal/day vs 1800kcal recommended.  Intervention/Goals:    Reinforced messaging about normalized eating to protect against binging and reactive hypoglycemia.  Reiterated self-care and importance of adequate participation in recovery.  Advised Body Image group and/or [facility]  IOP.  Stressed importance of adequate relaxation, sleep, hydration, as well as nutrition.  Advised not eating in the bedroom.  Monitoring and Evaluation: Patient will follow up in 2 weeks.  Advised weekly appointments

## 2015-06-14 ENCOUNTER — Ambulatory Visit (INDEPENDENT_AMBULATORY_CARE_PROVIDER_SITE_OTHER): Payer: Managed Care, Other (non HMO) | Admitting: Psychiatry

## 2015-06-14 ENCOUNTER — Encounter (HOSPITAL_COMMUNITY): Payer: Self-pay | Admitting: Psychiatry

## 2015-06-14 VITALS — BP 106/64 | HR 57 | Ht 65.5 in | Wt 147.2 lb

## 2015-06-14 DIAGNOSIS — F411 Generalized anxiety disorder: Secondary | ICD-10-CM

## 2015-06-14 DIAGNOSIS — F331 Major depressive disorder, recurrent, moderate: Secondary | ICD-10-CM

## 2015-06-14 MED ORDER — ZIPRASIDONE HCL 80 MG PO CAPS: 80.0000 mg | ORAL_CAPSULE | Freq: Every day | ORAL | Status: DC

## 2015-06-14 MED ORDER — FLUOXETINE HCL 20 MG PO CAPS: ORAL_CAPSULE | ORAL | Status: DC

## 2015-06-14 MED ORDER — CLONAZEPAM 0.5 MG PO TABS: ORAL_TABLET | ORAL | Status: DC

## 2015-06-14 NOTE — Progress Notes (Signed)
[facility] [phone] Progress Note  Katelyn [name] [id number] 40 y.o.  06/14/2015 11:23 AM  Chief Complaint:  I'm feeling overwhelmed.  My son having behavior problem.    History of Present Illness:  Katelyn [name] came for her appointment.   she is taking her medication as prescribed however she stopped taking Tegretol which was started on her last visit.  Patient told she is just not see any improvement after 3 week she decided to stop.  She admitted not compliant with her meal plan and there are times when she go into hypoglycemia but her weight has been stable.  She is very concerned about her 58 year old son who has significant behavior problem and having violent outbursts.  Patient told there are complaints from school and she is [facility] .  She described her mood is most of the time stable otherwise she feels anxious nervous.  She still have sad days but denies any recent active suicidal thoughts.  Her energy level remains low.  Her husband is trying to help the situation but most of the time he is not home and come very late.  She had a good support from her parents.  She is seeing [name] for counseling and now she decided to see a therapist for DBT.  She is taking Geodon, Prozac and Klonopin.  She is taking Klonopin 1 tablet to 2 tablets a day is helping her anxiety.  She sleeping better.  She admitted Geodon helping her paranoia and delusions and there has been no recent episodes of psychosis.  She does not want any mood stabilizer.  She wants to continue her current psychiatric medication.  Her weight is a stable.  She denies any episodes of dizziness, palpitation, fainting.  She also denies any recent purging or restricting her diet.  Patient denies drinking or using any illegal substances.  Suicidal Ideation: No Plan Formed: No Patient has means to carry out plan: No  Homicidal Ideation: No Plan Formed: No Patient has means to carry out plan:  No  Review of Systems  Constitutional: Negative for weight loss and malaise/fatigue.  Cardiovascular: Negative for chest pain.  Gastrointestinal: Negative for constipation.  Skin: Negative for itching and rash.  Neurological: Positive for tremors and headaches. Negative for dizziness and tingling.  Psychiatric/Behavioral: Positive for depression. Negative for suicidal ideas and substance abuse. The patient is nervous/anxious. The patient does not have insomnia.     Psychiatric: Agitation: No Hallucination:  Denies    Depressed Mood: no Insomnia: No Hypersomnia: No Altered Concentration: No Feels Worthless: No Grandiose Ideas: No Belief In Special Powers: No New/Increased Substance Abuse: No Compulsions: No  Neurologic: Headache: Yes Seizure: No Paresthesias: No  Psychosocial history Patient lives with her husband and children.  Her parents are very supportive who live close by.    Medical history Patient has history of polycystic ovary disease, obesity, headache and chronic pain.  Patient has gastric bypass surgery.  Her primary care physician is Dr. [name].  Outpatient Encounter Prescriptions as of 06/14/2015  Medication Sig  . clonazePAM (KLONOPIN) 0.5 MG tablet TAKE 1 TABLET BY MOUTH 2 TIMES DAILY.  Marland Kitchen docusate sodium (COLACE) 50 MG capsule Take 50 mg by mouth 2 (two) times daily. Reported on 04/24/2015  . FLUoxetine (PROZAC) 20 MG capsule Take 3 capsule daily  . Magnesium 400 MG CAPS Take 1 tablet by mouth. 1 tab daily  . Multiple Vitamin (MULTIVITAMIN WITH MINERALS) TABS tablet  Take 1 tablet by mouth every morning.  . ondansetron (ZOFRAN) 4 MG tablet Take 1 tablet (4 mg total) by mouth every 8 (eight) hours as needed for nausea or vomiting.  . topiramate (TOPAMAX) 100 MG tablet Take 1 tablet (100 mg total) by mouth 2 (two) times daily.  . ziprasidone (GEODON) 80 MG capsule Take 1 capsule (80 mg total) by mouth at bedtime.  . [DISCONTINUED] carbamazepine  (TEGRETOL) 100 MG chewable tablet Chew 1 tablet (100 mg total) by mouth 2 (two) times daily.  . [DISCONTINUED] clonazePAM (KLONOPIN) 0.5 MG tablet TAKE 1 TABLET BY MOUTH 3 TIMES DAILY.  . [DISCONTINUED] FLUoxetine (PROZAC) 20 MG capsule Take 3 capsule daily  . [DISCONTINUED] ziprasidone (GEODON) 80 MG capsule Take 1 capsule (80 mg total) by mouth at bedtime.   No facility-administered encounter medications on file as of 06/14/2015.   Recent Results (from the past 2160 hour(s))  POCT glucose (manual entry)     Status: None   Collection Time: 04/16/15 10:46 AM  Result Value Ref Range   POC Glucose 75 70 - 99 mg/dl    Comment: random      Past Psychiatric History/Hospitalization(s): Patient has at least 8 psychiatric inpatient treatment.  Her last admission was in February 2015 .  In the past she had tried good response with ECT however she scared to try ECT.  In the past she had tried Cymbalta, Lexapro, Abilify, lithium, Wellbutrin, Lamictal, Ritalin,  Remeron , Risperdal, Neurontin, Vistaril , BuSpar and Valium.     Anxiety: Yes Bipolar Disorder: No Depression: Yes Mania: No Psychosis: Yes Schizophrenia: No Personality Disorder: No Hospitalization for psychiatric illness: Yes History of Electroconvulsive Shock Therapy: Yes Prior Suicide Attempts: No  Physical Exam: Constitutional:  BP 106/64 mmHg  Pulse 57  Ht 5' 5.5" (1.664 m)  Wt 147 lb 3.2 oz (66.769 kg)  BMI 24.11 kg/m2  General Appearance: Anxious  Musculoskeletal: Strength & Muscle Tone: within normal limits Gait & Station: normal Patient leans: N/A  Mental status examination Patient is casually dressed and groomed.  She maintained fair eye contact.  She described her mood anxious and her affect is constricted.  Her speech is slow, soft, clear and coherent.  Her attention and concentration is fair.   she has mild tremors in her psychomotor activity is slightly decreased. She denies any auditory or visual hallucination  at this time.   she denies any delusions, obsessive thoughts or any paranoia.  She denies any active or passive suicidal thoughts or homicidal thought.  There were no flight if ideas or any loose association. Her fund of knowledge is adequate.  Her memory is grossly normal.  She is alert and oriented x3.  Her insight judgment is fair and her impulse control is okay.    Assessment: Axis I: Maj. depressive disorder with psychotic features, generalized anxiety disorder, eating disorder,  Axis II: Deferred  Axis III: Patient Active Problem List   Diagnosis Date Noted  . Anemia 06/10/2015  . Hypoglycemia 04/16/2015  . Encounter for examination for admission to residential institution 12/28/2014  . Severe recurrent major depression without psychotic features ([place])   . Sleep disorder 06/01/2013  . Generalized anxiety disorder 05/30/2013  . Chronic headaches 07/01/2012  . Depression, major, recurrent, severe with psychosis ([place]) 06/17/2012  . Family hx colonic polyps 03/03/2012  . Eating disorder 12/18/2011  . POLYCYSTIC OVARIAN DISEASE 09/01/2008    Plan: I review her records,  notes from her primary care physician , lab results  current medication and her treatment plan.   I would discontinue Tegretol since patient is not taking it.  She does not see any improvement.  She has not involved in any purging or weight restriction.  Her weight is stable.  Reinforce to compliant with her meal plan.  Encouraged to continue seeing [name] for counseling .  Patient also schedule for DBT counseling.  Discussed psychosocial stressors specially son need to see a therapist due to behavioral issues.  [facility]. I will continue Prozac 60 mg daily, Geodon 80 mg at bedtime and Klonopin 0.5 mg 2 times a day as needed. Discuss in length medication side effects and benefits.  Recommended to call us back if she has any question or any concern.  Discuss safety plan that anytime  having active suicidal thoughts or homicidal thoughts and she need to call 911 or go to the local emergency room.  Follow-up in 6 weeks Time spent 25 minutes.  More than 50% of the time spent in psychoeducation, counseling and coordination of care.  Patient is taking Topamax from Dr Romelle Starcher.  Patient does not want to cut down Topamax at this time.    ARFEEN,SYED T., MD 06/14/2015

## 2015-06-15 DIAGNOSIS — R630 Anorexia: Secondary | ICD-10-CM | POA: Diagnosis not present

## 2015-06-15 DIAGNOSIS — F333 Major depressive disorder, recurrent, severe with psychotic symptoms: Secondary | ICD-10-CM | POA: Diagnosis not present

## 2015-06-15 DIAGNOSIS — F411 Generalized anxiety disorder: Secondary | ICD-10-CM | POA: Diagnosis not present

## 2015-06-19 ENCOUNTER — Encounter: Payer: Managed Care, Other (non HMO) | Admitting: *Deleted

## 2015-06-19 DIAGNOSIS — F509 Eating disorder, unspecified: Secondary | ICD-10-CM

## 2015-06-19 NOTE — Patient Instructions (Addendum)
Stay hydrated Pack food medicine when you travel :-) Skipping meals and snacks never works out   Follow previous meal plan: Dairy: 3 Fruit: 3 Veg: 3-4 Starch: 8 Pro: 5 Fat: 6  Finish reading Life without Ed It's not all or nothing!  If you slip up, don't give up.  Practice self-compassion and grace Take yoga class Try relaxing yoga video at home

## 2015-06-19 NOTE — Progress Notes (Signed)
Appointment start time: 0800  Appointment end time: 0845  Patient was seen on 06/19/15 for nutrition counseling pertaining to disordered eating  Primary care provider: Dr. Moshe Cipro Therapist: Gracelyn Nurse, primary.  No ED therapist in many months Any other medical team members: Dr. Excell Seltzer, bariatric surgeon; Dr. Adele Schilder, psychiatrist; Dr. Chalmers Cater, endocrinologist   Assessment:  Is meeting with therapist weekly started DBT group weekly, last week's topic was "radical acceptance".  Tried to get in Body Image group, but it was full.  Is currently on wait list.  Therapist visit last week served to help refocus Katelyn [name].  She is wanting to recover and is committed to participating in treatment  Thinks her problem is "denial"; doesn't want to acknowledge she has mental illness due to negative feedback she's received over the years about mental illness Realizes she needs to acknowledge and work  Dietary recall" B: 1 scrambled egg, 2 slices bacon, 2 pancakes S: missed L: missed Binged rest of the day (candy, popcorn, hot pocket, chicken pot pie, mango)  Today B: boiled eggs, greek yogurt, granola  TANITA  BODY COMP RESULTS  06/19/15   BMI (kg/m^2) 23.6   Fat Mass (lbs) 39   Fat Free Mass (lbs) 109.5   Total Body Water (lbs) 80  % body fat 26.2     Mental health diagnosis: anorexia nervosa.  B/P subtype   Estimated energy intake: unknown  Maintenance needs: 1800 kcal 225 g CHO 90 g pro 60 g fat  Nutrition Diagnosis: NI-1.4 Inadequate energy intake As related to eating disorder.  As evidenced by consumption ~200 kcal/day vs 1800kcal recommended.  Intervention/Goals:    Nutrition counseling provided.  Reinforced recovery messages.  Addressed cognitive distortions and ED thoughts.    Goals: Stay hydrated Pack food medicine when you travel :-) Skipping meals and snacks never works out   Follow previous meal plan: Dairy: 3 Fruit: 3 Veg: 3-4 Starch: 8 Pro: 5 Fat:  6  Finish reading Life without Ed It's not all or nothing!  If you slip up, don't give up.  Practice self-compassion and grace Take yoga class Try relaxing yoga video at home  Monitoring and Evaluation: Patient will follow up in 2 weeks, per limited scheduling availability

## 2015-06-20 DIAGNOSIS — N643 Galactorrhea not associated with childbirth: Secondary | ICD-10-CM | POA: Diagnosis not present

## 2015-06-20 DIAGNOSIS — E119 Type 2 diabetes mellitus without complications: Secondary | ICD-10-CM | POA: Diagnosis not present

## 2015-06-20 DIAGNOSIS — D649 Anemia, unspecified: Secondary | ICD-10-CM | POA: Diagnosis not present

## 2015-06-20 DIAGNOSIS — F509 Eating disorder, unspecified: Secondary | ICD-10-CM | POA: Diagnosis not present

## 2015-06-20 DIAGNOSIS — E282 Polycystic ovarian syndrome: Secondary | ICD-10-CM | POA: Diagnosis not present

## 2015-06-22 DIAGNOSIS — F333 Major depressive disorder, recurrent, severe with psychotic symptoms: Secondary | ICD-10-CM | POA: Diagnosis not present

## 2015-06-22 DIAGNOSIS — R630 Anorexia: Secondary | ICD-10-CM | POA: Diagnosis not present

## 2015-06-22 DIAGNOSIS — F411 Generalized anxiety disorder: Secondary | ICD-10-CM | POA: Diagnosis not present

## 2015-06-28 DIAGNOSIS — R630 Anorexia: Secondary | ICD-10-CM | POA: Diagnosis not present

## 2015-06-28 DIAGNOSIS — F333 Major depressive disorder, recurrent, severe with psychotic symptoms: Secondary | ICD-10-CM | POA: Diagnosis not present

## 2015-06-28 DIAGNOSIS — F411 Generalized anxiety disorder: Secondary | ICD-10-CM | POA: Diagnosis not present

## 2015-07-03 ENCOUNTER — Encounter: Payer: Managed Care, Other (non HMO) | Attending: Family Medicine | Admitting: *Deleted

## 2015-07-03 ENCOUNTER — Ambulatory Visit: Payer: Self-pay | Admitting: *Deleted

## 2015-07-03 DIAGNOSIS — F509 Eating disorder, unspecified: Secondary | ICD-10-CM

## 2015-07-03 DIAGNOSIS — Z713 Dietary counseling and surveillance: Secondary | ICD-10-CM | POA: Diagnosis not present

## 2015-07-03 NOTE — Progress Notes (Signed)
Appointment start time: 0930  Appointment end time: 1015  Patient was seen on 07/03/15 for nutrition counseling pertaining to disordered eating  Primary care provider: Dr. Moshe Cipro Therapist: Gracelyn Nurse, primary.  No ED therapist in many months Any other medical team members: Dr. Excell Seltzer, bariatric surgeon; Dr. Adele Schilder, psychiatrist; Dr. Chalmers Cater, endocrinologist   Assessment: Katelyn [name] is separating from her husband.  She is struggling to follow the meal plan, though not intentionally.  She is not restricting on purpose, but is feeling very anxious and that is causing nausea.  She's trying to force herself to eat as she knows she needs to.  She is actually doing faily well, all things considered Is seeing psych on Friday.  Is seeing therapist weekly. And doing DBT group weekly: she likes that she's getting actual skills she can use Is going to stay in her home and husband is supposed to move out. Parents are not here and that's her main support  She is trying to take care of herself.  There are times when she doesn't follow the meal plan, but overall feels more in control with her eating lately.  There has been less binging.  Feels like she is stronger.  Sees a light at the end of the tunnel Is doing some guided meditations; taking time with friends, focus on kids; have not been to yoga class.   Son is in therapy now. Is doing family therapy    TANITA  BODY COMP RESULTS  06/19/15 07/03/15   BMI (kg/m^2) 23.6 23.8   Fat Mass (lbs) 39 38.5   Fat Free Mass (lbs) 109.5 106.5   Total Body Water (lbs) 80 78  % body fat 26.2 26.6     Mental health diagnosis: anorexia nervosa.  B/P subtype   Estimated energy intake: unknown  Maintenance needs: 1800 kcal 225 g CHO 90 g pro 60 g fat  Nutrition Diagnosis: NI-1.4 Inadequate energy intake As related to eating disorder.  As evidenced by consumption ~200 kcal/day vs 1800kcal recommended.  Intervention/Goals:   Nutrition counseling provided.    Talk to psych about med changes  Consider asking about Remeron or something else for sleep and anxiety  Consider asking about Zofran temporarily  Try ginger products for nausea: tea, gingerale, ginger snaps, ginger candies  Keep focusing on "food is medicine" need to eat 5-6 small protein+ carb occasions through out the day for physical stregth and mental strength You are so much stronger than you were and strong than you give yourself credit for Do ensure you get adequate carbs, otherwise you'll binge Try not to eat in bedroom  Go sit in the kitchen! Consider family meal at the table   Choose 3-4 random days to record foods  incorporate 20 minute walk or yoga   Follow previous meal plan: Dairy: 3 Fruit: 3 Veg: 3-4 Starch: 8 Pro: 5 Fat: 6    Monitoring and Evaluation: Patient will follow up in 3 weeks, per limited scheduling availability

## 2015-07-03 NOTE — Patient Instructions (Signed)
Talk to psych about med changes  Consider asking about Remeron or something else for sleep and anxiety  Consider asking about Zofran temporarily  Try ginger products for nausea: tea, gingerale, ginger snaps, ginger candies  Keep focusing on "food is medicine" need to eat 5-6 small protein+ carb occasions through out the day for physical stregth and mental strength You are so much stronger than you were and strong than you give yourself credit for Do ensure you get adequate carbs, otherwise you'll binge Try not to eat in bedroom  Go sit in the kitchen! Consider family meal at the table   Choose 3-4 random days to record foods  incorporate 20 minute walk or yoga

## 2015-07-04 ENCOUNTER — Ambulatory Visit: Payer: Self-pay | Admitting: *Deleted

## 2015-07-05 DIAGNOSIS — F411 Generalized anxiety disorder: Secondary | ICD-10-CM | POA: Diagnosis not present

## 2015-07-05 DIAGNOSIS — F333 Major depressive disorder, recurrent, severe with psychotic symptoms: Secondary | ICD-10-CM | POA: Diagnosis not present

## 2015-07-05 DIAGNOSIS — R630 Anorexia: Secondary | ICD-10-CM | POA: Diagnosis not present

## 2015-07-06 ENCOUNTER — Encounter (HOSPITAL_COMMUNITY): Payer: Self-pay | Admitting: Psychiatry

## 2015-07-06 ENCOUNTER — Ambulatory Visit (INDEPENDENT_AMBULATORY_CARE_PROVIDER_SITE_OTHER): Payer: Managed Care, Other (non HMO) | Admitting: Psychiatry

## 2015-07-06 VITALS — BP 118/64 | HR 72 | Ht 65.5 in | Wt 144.2 lb

## 2015-07-06 DIAGNOSIS — F331 Major depressive disorder, recurrent, moderate: Secondary | ICD-10-CM

## 2015-07-06 DIAGNOSIS — F411 Generalized anxiety disorder: Secondary | ICD-10-CM | POA: Diagnosis not present

## 2015-07-06 MED ORDER — ZIPRASIDONE HCL 60 MG PO CAPS: 60.0000 mg | ORAL_CAPSULE | Freq: Every day | ORAL | Status: DC
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
## 2015-07-20 DIAGNOSIS — F411 Generalized anxiety disorder: Secondary | ICD-10-CM | POA: Diagnosis not present

## 2015-07-20 DIAGNOSIS — F333 Major depressive disorder, recurrent, severe with psychotic symptoms: Secondary | ICD-10-CM | POA: Diagnosis not present

## 2015-07-20 DIAGNOSIS — R630 Anorexia: Secondary | ICD-10-CM | POA: Diagnosis not present

## 2015-07-23 ENCOUNTER — Encounter: Payer: Managed Care, Other (non HMO) | Admitting: *Deleted

## 2015-07-23 DIAGNOSIS — F509 Eating disorder, unspecified: Secondary | ICD-10-CM | POA: Diagnosis not present

## 2015-07-23 NOTE — Progress Notes (Signed)
Appointment start time: 1000  Appointment end time: 1030  Patient was seen on 07/23/15 for nutrition counseling pertaining to disordered eating  Primary care provider: Dr. Moshe Cipro Therapist: Gracelyn Nurse, primary.  No ED therapist in many months Any other medical team members: Dr. Excell Seltzer, bariatric surgeon; Dr. Adele Schilder, psychiatrist; Dr. Chalmers Cater, endocrinologist   Assessment: Katelyn [name] has separated from her husband.  She feels better about that, but things are still quite stressful with managing the divorce paperwork, etc. She is using a lawyer No more nausea.  Isn't sleeping as well, but is ok with that.  Sleeping ~5 hour.  Is waking up 3 am and there is no going back to sleep.   No yoga yet Has been avoiding her feelings and not taking care of her mental health.  Is eating mindlessly and isn't doing her paperwork.    24 hour recall Not sure, can't remember Didn't eat until 3 pm: lasagna, cheeseburger, pretzels, cantaloupe, strawberries, diet ice cream sandwich, peanuts Beverages: coffee, crystal light, diet apple juice    TANITA  BODY COMP RESULTS  06/19/15 07/03/15 07/23/15 *scale significantly off   BMI (kg/m^2) 23.6 23.8 24.1   Fat Mass (lbs) 39 38.5 28   Fat Free Mass (lbs) 109.5 106.5 123.5   Total Body Water (lbs) 80 78 90.5  % body fat 26.2 26.6 18.4     Mental health diagnosis: anorexia nervosa.  B/P subtype   Estimated energy intake: unknown  Maintenance needs: 1800 kcal 225 g CHO 90 g pro 60 g fat  Nutrition Diagnosis: NI-1.4 Inadequate energy intake As related to eating disorder.  As evidenced by consumption ~200 kcal/day vs 1800kcal recommended.  Intervention/Goals:   Nutrition counseling provided.   Put yourself first!  You can't take care of your kids without you being safe and healthy.  Log food, eat breakfast with kids and start day off right.  Follow structured meal plan, eat mindfully at the table.  Practice self-care by doing homework from therapist,  yoga, walking, meditation, bubble bath, etc.    Follow previous meal plan: Dairy: 3 Fruit: 3 Veg: 3-4 Starch: 8 Pro: 5 Fat: 6    Monitoring and Evaluation: Patient will follow up in 1 weeks

## 2015-07-26 ENCOUNTER — Ambulatory Visit (HOSPITAL_COMMUNITY): Payer: Self-pay | Admitting: Psychiatry

## 2015-07-30 ENCOUNTER — Encounter: Payer: Managed Care, Other (non HMO) | Attending: Family Medicine | Admitting: *Deleted

## 2015-07-30 DIAGNOSIS — Z713 Dietary counseling and surveillance: Secondary | ICD-10-CM | POA: Insufficient documentation

## 2015-07-30 DIAGNOSIS — F509 Eating disorder, unspecified: Secondary | ICD-10-CM

## 2015-07-30 NOTE — Progress Notes (Signed)
Appointment start time: 1130  Appointment end time: 1200  Patient was seen on 07/30/15 for nutrition counseling pertaining to disordered eating  Primary care provider: Dr. Moshe Cipro Therapist: Gracelyn Nurse, primary.  No ED therapist in many months Any other medical team members: Dr. Excell Seltzer, bariatric surgeon; Dr. Adele Schilder, psychiatrist; Dr. Chalmers Cater, endocrinologist   Assessment: Her anxiety is worse lately and she's been eating less.  Has appt with psychiatry 4/7.  Still seeing therapist weekly (didn't see last week) and still has DBT group.   states she is really nauseated from her anxiety and not eating well.  This has happened before.  Advised discussing need for anti-emetic with medical provider. States she uses her home scale to make sure she's not dropping too low.  Tries to stay in 140s. States she weighs every couple days, depending on her eating behaviors  Still is not practicing self-care.  Staying busy and going through motions, but not taking care of herself. Not logging foods, or taking time for herself.  24 hour recall Yogurt with granola Skipped lunch Dinner with parents: 1 rib, 1/2 corn, pineapple    TANITA  BODY COMP RESULTS  06/19/15 07/03/15 07/30/15   BMI (kg/m^2) 23.6 23.8 23   Fat Mass (lbs) 39 38.5 42   Fat Free Mass (lbs) 109.5 106.5 102.   Total Body Water (lbs) 80 78 75  % body fat 26.2 26.6 29.1     Mental health diagnosis: anorexia nervosa.  B/P subtype   Estimated energy intake: unknown  Maintenance needs: 1800 kcal 225 g CHO 90 g pro 60 g fat  Nutrition Diagnosis: NI-1.4 Inadequate energy intake As related to eating disorder.  As evidenced by consumption ~200 kcal/day vs 1800kcal recommended.  Intervention/Goals:   Nutrition counseling provided.   Put yourself first!  You can't take care of your kids without you being safe and healthy.  Log food, eat breakfast with kids and start day off right.  Follow structured meal plan, eat mindfully at the  table.  Practice self-care by doing homework from therapist, yoga, walking, meditation, bubble bath, etc.  Goals:  Ask psych about nausea meds Use ginger and peppermint in the interim  Try BRAT foods: banana, rice, applesacue, toast (try peanut butter) maybe Ezekiel bread Try probiotic either yogurt or Kefir or Culturelle supplement Go on youtube and try yoga for anxiety MAKE the time for self care: yoga or medication or prayer or walking daily  Clean less to [facility] time for you Shift focus from staying busy to things that help you  -Log foods, eat 5-6 times/day, eat in kitchen or living room and not in the bedroom  Stay hydrated with non caffeinated beverages  Eat with kids, when possible   -Practice mindfullness, fill out log sheet for [name], make time for self-care (take break from other busywork), reach out for support people  Read Life without Ed. Embody by Eileen Stanford; color     Follow previous meal plan: Dairy: 3 Fruit: 3 Veg: 3-4 Starch: 8 Pro: 5 Fat: 6    Monitoring and Evaluation: Patient will follow up in 2 weeks as she will be going out of town next week

## 2015-07-30 NOTE — Patient Instructions (Addendum)
You deserve kindness from yourself.  Treat yourself with kindness   Ask psych about nausea meds Use ginger and peppermint in the interim  Try BRAT foods: banana, rice, applesacue, toast (try peanut butter) maybe Ezekiel bread Try probiotic either yogurt or Kefir or Culturelle supplement Go on youtube and try yoga for anxiety MAKE the time for self care: yoga or medication or prayer or walking daily  Clean less to [facility] time for you Shift focus from staying busy to things that help you  -Log foods, eat 5-6 times/day, eat in kitchen or living room and not in the bedroom  Stay hydrated with non caffeinated beverages  Eat with kids, when possible   -Practice mindfullness, fill out log sheet for [name], make time for self-care (take break from other busywork), reach out for support people  Read Life without Ed. Embody by Eileen Stanford; color

## 2015-08-01 DIAGNOSIS — R630 Anorexia: Secondary | ICD-10-CM | POA: Diagnosis not present

## 2015-08-01 DIAGNOSIS — F333 Major depressive disorder, recurrent, severe with psychotic symptoms: Secondary | ICD-10-CM | POA: Diagnosis not present

## 2015-08-01 DIAGNOSIS — F411 Generalized anxiety disorder: Secondary | ICD-10-CM | POA: Diagnosis not present

## 2015-08-03 ENCOUNTER — Encounter (HOSPITAL_COMMUNITY): Payer: Self-pay | Admitting: Psychiatry

## 2015-08-03 ENCOUNTER — Ambulatory Visit (INDEPENDENT_AMBULATORY_CARE_PROVIDER_SITE_OTHER): Payer: Managed Care, Other (non HMO) | Admitting: Psychiatry

## 2015-08-03 VITALS — BP 102/68 | HR 75 | Ht 65.5 in | Wt 148.0 lb

## 2015-08-03 DIAGNOSIS — F331 Major depressive disorder, recurrent, moderate: Secondary | ICD-10-CM | POA: Diagnosis not present

## 2015-08-03 DIAGNOSIS — F411 Generalized anxiety disorder: Secondary | ICD-10-CM | POA: Diagnosis not present

## 2015-08-03 MED ORDER — FLUOXETINE HCL 20 MG PO CAPS: ORAL_CAPSULE | ORAL | Status: DC

## 2015-08-03 MED ORDER — CLONAZEPAM 0.5 MG PO TABS: ORAL_TABLET | ORAL | Status: DC

## 2015-08-03 MED ORDER — ZIPRASIDONE HCL 60 MG PO CAPS: 60.0000 mg | ORAL_CAPSULE | Freq: Every day | ORAL | Status: DC

## 2015-08-03 NOTE — Progress Notes (Signed)
[facility] Progress Note  Katelyn [name] [id number] 40 y.o.  08/03/2015 10:56 AM  Chief Complaint:  I still have some anxiety but I'm feeling better from the past.        History of Present Illness:  Katelyn [name] came for her follow-up appointment.  On her last visit she recommended to reduce Geodon because it was making her too sleepy and she does not want to be in deep sleep and she is living with her children and her husband is moved out.  She is been separated and she is feeling much better after the decision.  She was disappointed last week when she find out that her husband already seeing someone .  She admitted that day very depressed and having fleeting suicidal thoughts but now she feels proud that she made the better decision.  She had a good support from her parents.  She still have anxiousness and nervousness.  But overall her depression is better.  She sleeping okay.  Her appetite is better.  She is able to keep the food inside and able to gain a few more pounds from the past.  She denies any agitation, anger, mood swing.  She mentioned her daughter is very relieved since the separation because she was very scared from her husband.  Patient has mild tremors but overall she has no other concern.  She is seeing [name] a regular basis.  Now she is taking Klonopin more frequently is helping her anxiety.  She denies any feeling of hopelessness or worthlessness.  She denies any active or passive suicidal thoughts.  Patient denies drinking or using any illegal substances.  Suicidal Ideation: No Plan Formed: No Patient has means to carry out plan: No  Homicidal Ideation: No Plan Formed: No Patient has means to carry out plan: No  Review of Systems  Constitutional: Negative for weight loss and malaise/fatigue.  Cardiovascular: Negative for chest pain.  Gastrointestinal: Negative for constipation.  Skin: Negative for itching and rash.  Neurological: Positive for tremors.  Negative for dizziness and tingling.  Psychiatric/Behavioral: Negative for suicidal ideas and substance abuse. The patient is nervous/anxious. The patient does not have insomnia.     Psychiatric: Agitation: No Hallucination:  Denies    Depressed Mood: no Insomnia: No Hypersomnia: No Altered Concentration: No Feels Worthless: No Grandiose Ideas: No Belief In Special Powers: No New/Increased Substance Abuse: No Compulsions: No  Neurologic: Headache: Yes Seizure: No Paresthesias: No  Psychosocial history Patient lives with her husband and children.  Her parents are very supportive who live close by.    Medical history Patient has history of polycystic ovary disease, obesity, headache and chronic pain.  Patient has gastric bypass surgery.  Her primary care physician is Dr. [name].  Outpatient Encounter Prescriptions as of 08/03/2015  Medication Sig  . clonazePAM (KLONOPIN) 0.5 MG tablet TAKE 1 TABLET BY MOUTH 3 TIMES DAILY.  Marland Kitchen docusate sodium (COLACE) 50 MG capsule Take 50 mg by mouth 2 (two) times daily. Reported on 04/24/2015  . FLUoxetine (PROZAC) 20 MG capsule Take 3 capsule daily  . Magnesium 400 MG CAPS Take 1 tablet by mouth. 1 tab daily  . Multiple Vitamin (MULTIVITAMIN WITH MINERALS) TABS tablet Take 1 tablet by mouth every morning.  . ondansetron (ZOFRAN) 4 MG tablet Take 1 tablet (4 mg total) by mouth every 8 (eight) hours as needed for nausea or vomiting.  . topiramate (TOPAMAX) 100 MG tablet Take 1 tablet (100 mg total) by mouth 2 (  two) times daily.  . ziprasidone (GEODON) 60 MG capsule Take 1 capsule (60 mg total) by mouth at bedtime.  . [DISCONTINUED] clonazePAM (KLONOPIN) 0.5 MG tablet TAKE 1 TABLET BY MOUTH 2 TIMES DAILY.  . [DISCONTINUED] FLUoxetine (PROZAC) 20 MG capsule Take 3 capsule daily  . [DISCONTINUED] ziprasidone (GEODON) 60 MG capsule Take 1 capsule (60 mg total) by mouth at bedtime.   No facility-administered encounter medications on file as  of 08/03/2015.   No results found for this or any previous visit (from the past 2160 hour(s)).   Past Psychiatric History/Hospitalization(s): Patient has at least 8 psychiatric inpatient treatment.  Her last admission was in February 2015 .  In the past she had tried good response with ECT however she scared to try ECT.  In the past she had tried Cymbalta, Lexapro, Abilify, lithium, Wellbutrin, Lamictal, Ritalin,  Remeron , Risperdal, Neurontin, Vistaril , BuSpar and Valium.     Anxiety: Yes Bipolar Disorder: No Depression: Yes Mania: No Psychosis: Yes Schizophrenia: No Personality Disorder: No Hospitalization for psychiatric illness: Yes History of Electroconvulsive Shock Therapy: Yes Prior Suicide Attempts: No  Physical Exam: Constitutional:  BP 102/68 mmHg  Pulse 75  Ht 5' 5.5" (1.664 m)  Wt 148 lb (67.132 kg)  BMI 24.25 kg/m2  General Appearance: Anxious  Musculoskeletal: Strength & Muscle Tone: within normal limits Gait & Station: normal Patient leans: N/A  Mental status examination Patient is casually dressed and groomed.  She maintained fair eye contact.  She described her mood anxious and her affect is Mood appropriate.  Her speech is slow, soft, clear and coherent.  Her attention and concentration is fair.   she has mild tremors in her hand .  Her psychomotor activity is slightly decreased. She denies any auditory or visual hallucination at this time.   she denies any delusions, obsessive thoughts or any paranoia.  She denies any active or passive suicidal thoughts or homicidal thought.  There were no flight if ideas or any loose association. Her fund of knowledge is adequate.  Her memory is grossly normal.  She is alert and oriented x3.  Her insight judgment is fair and her impulse control is okay.    Assessment: Axis I: Maj. depressive disorder with psychotic features, generalized anxiety disorder, eating disorder,  Axis II: Deferred  Axis III: Patient Active Problem  List   Diagnosis Date Noted  . Anemia 06/10/2015  . Hypoglycemia 04/16/2015  . Encounter for examination for admission to residential institution 12/28/2014  . Severe recurrent major depression without psychotic features ([place])   . Sleep disorder 06/01/2013  . Generalized anxiety disorder 05/30/2013  . Chronic headaches 07/01/2012  . Depression, major, recurrent, severe with psychosis ([place]) 06/17/2012  . Family hx colonic polyps 03/03/2012  . Eating disorder 12/18/2011  . POLYCYSTIC OVARIAN DISEASE 09/01/2008    Plan: Patient feels that she made a good decision about separation from her husband.  She is seeing therapist on a regular basis.  We discuss if paranoia started to get worse then she should go back to Geodon 80 mg at this time she is fairly stable on her current psychiatric medication however I recommended to try Klonopin 3 times a day to help her anxiety.  She really like Prozac and she [facility].  I will continue Prozac 60 mg daily, Geodon 60 mg at bedtime recommended to continue DBT and individual counseling.  Reinforce to stick with a meal plan. Recommended to call  us back if she has any question or any concern.  Discuss safety plan that anytime having active suicidal thoughts or homicidal thoughts and she need to call 911 or go to the local emergency room.  Follow-up in 6 weeks  Patient is taking Topamax from Dr Romelle Starcher.  Patient does not want to cut down Topamax at this time.    Trueman Worlds T., MD 08/03/2015

## 2015-08-14 DIAGNOSIS — F3112 Bipolar disorder, current episode manic without psychotic features, moderate: Secondary | ICD-10-CM | POA: Diagnosis not present

## 2015-08-16 ENCOUNTER — Ambulatory Visit: Payer: Self-pay | Admitting: *Deleted

## 2015-08-23 DIAGNOSIS — F3112 Bipolar disorder, current episode manic without psychotic features, moderate: Secondary | ICD-10-CM | POA: Diagnosis not present

## 2015-08-23 DIAGNOSIS — R630 Anorexia: Secondary | ICD-10-CM | POA: Diagnosis not present

## 2015-08-23 DIAGNOSIS — F411 Generalized anxiety disorder: Secondary | ICD-10-CM | POA: Diagnosis not present

## 2015-08-28 ENCOUNTER — Encounter: Payer: Self-pay | Admitting: *Deleted

## 2015-08-28 ENCOUNTER — Encounter: Payer: Managed Care, Other (non HMO) | Attending: Family Medicine | Admitting: *Deleted

## 2015-08-28 DIAGNOSIS — F509 Eating disorder, unspecified: Secondary | ICD-10-CM

## 2015-08-28 DIAGNOSIS — Z713 Dietary counseling and surveillance: Secondary | ICD-10-CM | POA: Diagnosis not present

## 2015-08-28 NOTE — Patient Instructions (Addendum)
Put reminders ALL OVER THE PLACE so you can see how important you are and how important food is!  Use a visual reminder when you're struggling to use your coping skills  Meditation  Aroma therapy  Distraction  Call a friend  Break ice cubes in the shower/bath tub  Do relaxation yoga- not your power yoga  With eating disorder thoughts  Eat regularly. Stay ahead of the binge  Eat balanced  opposite action.  Whatever Ed says, do the opposite   Challenge those thoughts with truth: "food is fuel.  And my body/brain don't function without adequate fuel.  End of story. " restricting causes anxiety to be worse and depression."  Remind yourself you deserve nutrition DAILY     When you're nauseated  Ginger  Peppermint  Try probiotic regularly- Culturelle  Preventative measures  Eat regularly  Probiotic  Yoga  Meditation  Staying hydrated with water  Plan ahead with meals and snacks!   When you wake up early, do a meditation instead of getting on the phone or cleaning

## 2015-08-28 NOTE — Progress Notes (Signed)
Appointment start time: 0800  Appointment end time: 0845  Patient was seen on 08/28/15 for nutrition counseling pertaining to disordered eating  Primary care provider: Dr. Moshe Cipro Therapist: Gracelyn Nurse, primary.  No ED therapist in many months Any other medical team members: Dr. Excell Seltzer, bariatric surgeon; Dr. Adele Schilder, psychiatrist; Dr. Chalmers Cater, endocrinologist   Assessment:  States things are about the same.  States she is "just here."  She is glad that she is here.  Nausea comes and goes.  Is still anxious.  meds were increased.   Is not managing her nausea.  Is not using ginger or mint or a bland diet.  States she is just trying to do what she can and eat when she can  B: yogurt with granola L: chicken biscuit S; cottage cheese and chips D: bacon, eggs, cheese biscuit Beverages: decaff coffe, water, diet lemonade   TANITA  BODY COMP RESULTS  06/19/15 07/03/15 07/30/15 08/28/15   BMI (kg/m^2) 23.6 23.8 23 22.8   Fat Mass (lbs) 39 38.5 42 39   Fat Free Mass (lbs) 109.5 106.5 102. 104.5   Total Body Water (lbs) 80 78 75 76.5  % body fat 26.2 26.6 29.1 27.2     Mental health diagnosis: anorexia nervosa.  B/P subtype   Estimated energy intake: unknown  Maintenance needs: 1800 kcal 225 g CHO 90 g pro 60 g fat  Nutrition Diagnosis: NI-1.4 Inadequate energy intake As related to eating disorder.  As evidenced by consumption ~200 kcal/day vs 1800kcal recommended.  Intervention/Goals:   Nutrition counseling provided.   Put yourself first!  You can't take care of your kids without you being safe and healthy.  Log food, eat breakfast with kids and start day off right.  Follow structured meal plan, eat mindfully at the table.  Practice self-care by doing homework from therapist, yoga, walking, meditation, bubble bath, etc.  Goals:  Put reminders ALL OVER THE PLACE so you can see how important you are and how important food is!  Use a visual reminder when you're struggling to use  your coping skills  Meditation  Aroma therapy  Distraction  Call a friend  Break ice cubes in the shower/bath tub  Do relaxation yoga- not your power yoga  With eating disorder thoughts  Eat regularly. Stay ahead of the binge  Eat balanced  opposite action.  Whatever Ed says, do the opposite   Challenge those thoughts with truth: "food is fuel.  And my body/brain don't function without adequate fuel.  End of story. " restricting causes anxiety to be worse and depression."  Remind yourself you deserve nutrition DAILY     When you're nauseated  Ginger  Peppermint  Try probiotic regularly- Culturelle  Preventative measures  Eat regularly  Probiotic  Yoga  Meditation  Staying hydrated with water  Plan ahead with meals and snacks!   When you wake up early, do a meditation instead of getting on the phone or cleaning   Follow previous meal plan: Dairy: 3 Fruit: 3 Veg: 3-4 Starch: 8 Pro: 5 Fat: 6    Monitoring and Evaluation: Patient will follow up in 1 week

## 2015-08-30 DIAGNOSIS — N898 Other specified noninflammatory disorders of vagina: Secondary | ICD-10-CM | POA: Diagnosis not present

## 2015-08-30 DIAGNOSIS — R300 Dysuria: Secondary | ICD-10-CM | POA: Diagnosis not present

## 2015-08-30 DIAGNOSIS — Z113 Encounter for screening for infections with a predominantly sexual mode of transmission: Secondary | ICD-10-CM | POA: Diagnosis not present

## 2015-08-30 DIAGNOSIS — Z01411 Encounter for gynecological examination (general) (routine) with abnormal findings: Secondary | ICD-10-CM | POA: Diagnosis not present

## 2015-09-03 ENCOUNTER — Encounter: Payer: Managed Care, Other (non HMO) | Admitting: *Deleted

## 2015-09-03 ENCOUNTER — Encounter: Payer: Self-pay | Admitting: *Deleted

## 2015-09-03 DIAGNOSIS — F509 Eating disorder, unspecified: Secondary | ICD-10-CM | POA: Diagnosis not present

## 2015-09-03 NOTE — Progress Notes (Signed)
Appointment start time: 0800  Appointment end time: 0830  Patient was seen on 09/03/15 for nutrition counseling pertaining to disordered eating  Primary care provider: Dr. Moshe Cipro Therapist: Gracelyn Nurse, primary.  No ED therapist in many months Any other medical team members: Dr. Excell Seltzer, bariatric surgeon; Dr. Adele Schilder, psychiatrist; Dr. Chalmers Cater, endocrinologist   Assessment: Katelyn [name] thinks he weight is up because she's been binging this past week.  She did not complete any food logs due to shame over her binging.   She states she actually feels better, both mentally and physically this week after eating.  Her anxiety has improved, her mood has improved, and she feels like she actually has more energy to do things around the house.  She is concerned about weight gain, but states "it's not the end of the world."  She expresses some frustration at not being able to "be in control" of her binges, but acknowledges the binges are a direct result of her restricting.  She did experience more frequent reactive hypoglycemia this week which she attributes to her dietary choices.  She is treating hypoglycemia with  Appropriate glucose, but not waiting adequate time for allow for absorption before starting protein. She would like to start back on a structured meal plan and has questions about that   Dietary recall Can't remember yesterday.  Did not log   TANITA  BODY COMP RESULTS  06/19/15 07/03/15 07/30/15 08/28/15 09/03/15   BMI (kg/m^2) 23.6 23.8 23 22.8 23.8   Fat Mass (lbs) 39 38.5 42 39 43   Fat Free Mass (lbs) 109.5 106.5 102. 104.5 106.5   Total Body Water (lbs) 80 78 75 76.5 78  % body fat 26.2 26.6 29.1 27.2 28.8     Mental health diagnosis: anorexia nervosa.  B/P subtype   Estimated energy intake: unknown  Maintenance needs: 1800 kcal 225 g CHO 90 g pro 60 g fat  Nutrition Diagnosis: NI-1.4 Inadequate energy intake As related to eating disorder.  As evidenced by consumption ~200  kcal/day vs 1800kcal recommended.  Intervention/Goals:   Nutrition counseling provided.  Discussed her need for adequate fuel and the binging as result of restriction.  Discussed how much better she feels with adequate fuel.  Discussed health vs weight.  Discussed Ed thoughts. Educated on proper treatment of hypoglycemia.  Reviewed structured meal plan and answered questions.  Discussed shame of binges being Ed thought and usefulness of food logs     Goals:  Put reminders ALL OVER THE PLACE so you can see how important you are and how important food is!  Use a visual reminder when you're struggling to use your coping skills  Meditation  Aroma therapy  Distraction  Call a friend  Break ice cubes in the shower/bath tub  Do relaxation yoga- not your power yoga  With eating disorder thoughts  Eat regularly. Stay ahead of the binge  Eat balanced  opposite action.  Whatever Ed says, do the opposite   Challenge those thoughts with truth: "food is fuel.  And my body/brain don't function without adequate fuel.  End of story. " restricting causes anxiety to be worse and depression."  Remind yourself you deserve nutrition DAILY     When you're nauseated  Ginger  Peppermint  Try probiotic regularly- Culturelle  Preventative measures  Eat regularly  Probiotic  Yoga  Meditation  Staying hydrated with water  Plan ahead with meals and snacks!   When you wake up early, do a meditation instead of getting  on the phone or cleaning   Follow previous meal plan: Dairy: 3 Fruit: 3 Veg: 3-4 Starch: 8 Pro: 5 Fat: 6    Monitoring and Evaluation: Patient will follow up in 1 week

## 2015-09-07 DIAGNOSIS — F411 Generalized anxiety disorder: Secondary | ICD-10-CM | POA: Diagnosis not present

## 2015-09-07 DIAGNOSIS — F3112 Bipolar disorder, current episode manic without psychotic features, moderate: Secondary | ICD-10-CM | POA: Diagnosis not present

## 2015-09-07 DIAGNOSIS — R630 Anorexia: Secondary | ICD-10-CM | POA: Diagnosis not present

## 2015-09-11 ENCOUNTER — Encounter (HOSPITAL_COMMUNITY): Payer: Self-pay | Admitting: Psychiatry

## 2015-09-11 ENCOUNTER — Ambulatory Visit (INDEPENDENT_AMBULATORY_CARE_PROVIDER_SITE_OTHER): Payer: Managed Care, Other (non HMO) | Admitting: Psychiatry

## 2015-09-11 VITALS — BP 118/70 | HR 62 | Ht 65.5 in | Wt 150.6 lb

## 2015-09-11 DIAGNOSIS — F411 Generalized anxiety disorder: Secondary | ICD-10-CM

## 2015-09-11 DIAGNOSIS — F331 Major depressive disorder, recurrent, moderate: Secondary | ICD-10-CM

## 2015-09-11 MED ORDER — CLONAZEPAM 0.5 MG PO TABS: ORAL_TABLET | ORAL | Status: DC
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
## 2015-09-13 ENCOUNTER — Encounter (HOSPITAL_COMMUNITY): Payer: Self-pay | Admitting: Psychiatry

## 2015-09-13 ENCOUNTER — Other Ambulatory Visit (HOSPITAL_COMMUNITY): Payer: 59 | Attending: Psychiatry | Admitting: Psychiatry

## 2015-09-13 DIAGNOSIS — F509 Eating disorder, unspecified: Secondary | ICD-10-CM

## 2015-09-13 DIAGNOSIS — F323 Major depressive disorder, single episode, severe with psychotic features: Secondary | ICD-10-CM | POA: Insufficient documentation

## 2015-09-13 DIAGNOSIS — Z30430 Encounter for insertion of intrauterine contraceptive device: Secondary | ICD-10-CM | POA: Diagnosis not present

## 2015-09-13 DIAGNOSIS — Z9884 Bariatric surgery status: Secondary | ICD-10-CM | POA: Diagnosis not present

## 2015-09-13 DIAGNOSIS — F333 Major depressive disorder, recurrent, severe with psychotic symptoms: Secondary | ICD-10-CM

## 2015-09-13 DIAGNOSIS — F4310 Post-traumatic stress disorder, unspecified: Secondary | ICD-10-CM | POA: Diagnosis not present

## 2015-09-13 DIAGNOSIS — Z87891 Personal history of nicotine dependence: Secondary | ICD-10-CM | POA: Diagnosis not present

## 2015-09-13 DIAGNOSIS — F411 Generalized anxiety disorder: Secondary | ICD-10-CM

## 2015-09-13 DIAGNOSIS — N854 Malposition of uterus: Secondary | ICD-10-CM | POA: Diagnosis not present
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
## 2015-09-13 NOTE — Progress Notes (Signed)
    Daily Group Progress Note  Program: IOP  Group Time: 9:00-10:30  Participation Level: Active  Behavioral Response: Appropriate  Type of Therapy:  Group Therapy  Summary of Progress: Pt. Met with case manager and psychiatrist for intake assessment for first day of group.     Group Time: 10:30-12:00  Participation Level:  None  Behavioral Response: Appropriate  Type of Therapy: Psycho-education Group  Summary of Progress: Pt. Did not attend due to previously scheduled doctor's appointment.  Nancie Neas, LPC

## 2015-09-14 ENCOUNTER — Ambulatory Visit: Payer: Self-pay | Admitting: *Deleted

## 2015-09-14 ENCOUNTER — Other Ambulatory Visit (HOSPITAL_COMMUNITY): Payer: 59 | Admitting: Psychiatry

## 2015-09-14 DIAGNOSIS — F323 Major depressive disorder, single episode, severe with psychotic features: Secondary | ICD-10-CM | POA: Diagnosis not present

## 2015-09-14 DIAGNOSIS — F411 Generalized anxiety disorder: Secondary | ICD-10-CM

## 2015-09-17 ENCOUNTER — Other Ambulatory Visit (HOSPITAL_COMMUNITY): Payer: 59 | Admitting: Psychiatry

## 2015-09-18 ENCOUNTER — Telehealth (HOSPITAL_COMMUNITY): Payer: Self-pay | Admitting: Psychiatry

## 2015-09-18 ENCOUNTER — Other Ambulatory Visit (HOSPITAL_COMMUNITY): Payer: 59 | Admitting: Psychiatry

## 2015-09-18 NOTE — Progress Notes (Signed)
    Daily Group Progress Note  Program: IOP  Group Time: 9:00-10:30  Participation Level: Active  Behavioral Response: Appropriate  Type of Therapy:  Group Therapy  Summary of Progress: Pt. Presented as quiet with flat affect. Pt. Did not share in group, but stated that she would like to wait until Monday to speak.     Group Time: 10:30-12:00  Participation Level:  Active  Behavioral Response: Appropriate  Type of Therapy: Psycho-education Group  Summary of Progress: Pt. Participated in grief and loss with the Chaplain.  Nancie Neas, LPC

## 2015-09-19 ENCOUNTER — Other Ambulatory Visit (HOSPITAL_COMMUNITY): Payer: 59 | Admitting: Psychiatry

## 2015-09-19 DIAGNOSIS — F411 Generalized anxiety disorder: Secondary | ICD-10-CM

## 2015-09-19 DIAGNOSIS — F323 Major depressive disorder, single episode, severe with psychotic features: Secondary | ICD-10-CM | POA: Diagnosis not present

## 2015-09-20 ENCOUNTER — Other Ambulatory Visit (HOSPITAL_COMMUNITY): Payer: 59 | Admitting: Psychiatry

## 2015-09-20 DIAGNOSIS — F411 Generalized anxiety disorder: Secondary | ICD-10-CM

## 2015-09-20 DIAGNOSIS — F323 Major depressive disorder, single episode, severe with psychotic features: Secondary | ICD-10-CM | POA: Diagnosis not present
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
## 2015-09-21 ENCOUNTER — Other Ambulatory Visit (HOSPITAL_COMMUNITY): Payer: 59 | Admitting: Psychiatry

## 2015-09-21 DIAGNOSIS — F323 Major depressive disorder, single episode, severe with psychotic features: Secondary | ICD-10-CM | POA: Diagnosis not present

## 2015-09-21 DIAGNOSIS — F411 Generalized anxiety disorder: Secondary | ICD-10-CM
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
## 2015-09-21 NOTE — Progress Notes (Signed)
    Daily Group Progress Note  Program: IOP  Group Time: 9:00-12:00  Participation Level: Active  Behavioral Response: Appropriate  Type of Therapy:  Group Therapy  Summary of Progress: Pt. Was primarily quiet, alert, engaged in the group process. Pt. Reported that she exercised twice the day before because she was following suggestions made to another patient, despite the fact that she has been instructed not to exercise due to eating disorder. Pt. Reported that she was feeling significant anxiety. Pt.participated in reflection on reading and suggestions for meditation for the day.    Nancie Neas, LPC

## 2015-09-25 ENCOUNTER — Other Ambulatory Visit (HOSPITAL_COMMUNITY): Payer: 59 | Admitting: Psychiatry

## 2015-09-25 DIAGNOSIS — F323 Major depressive disorder, single episode, severe with psychotic features: Secondary | ICD-10-CM | POA: Diagnosis not present

## 2015-09-25 DIAGNOSIS — F331 Major depressive disorder, recurrent, moderate: Secondary | ICD-10-CM

## 2015-09-25 NOTE — Progress Notes (Signed)
    Daily Group Progress Note  Program: IOP  Group Time: 10:30-12:00pm  Participation Level: Active  Behavioral Response: Appropriate  Type of Therapy:  Group Therapy  Summary of Progress: Pt participated in a discussion on her circle of recovery. Since living with her depression pt has isolated herself and is apprehenisve about trying to widen her circle. Pt shared she has also been betrayed by a friend who tried to sabotage a new relationship. Pt asked for suggestions from the group and was given good feedback about trust. Pt wants to widen her circle but has trust issues. Pt will work on finding positive supportive people that will be an addition to her life.      MACKENZIE,LISBETH S, Licensed Cli

## 2015-09-26 ENCOUNTER — Other Ambulatory Visit (HOSPITAL_COMMUNITY): Payer: 59 | Admitting: Psychiatry
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
## 2015-09-27 ENCOUNTER — Other Ambulatory Visit (HOSPITAL_COMMUNITY): Payer: 59 | Attending: Psychiatry | Admitting: Psychiatry

## 2015-09-27 DIAGNOSIS — R630 Anorexia: Secondary | ICD-10-CM | POA: Diagnosis not present

## 2015-09-27 DIAGNOSIS — F331 Major depressive disorder, recurrent, moderate: Secondary | ICD-10-CM | POA: Diagnosis present

## 2015-09-27 DIAGNOSIS — F411 Generalized anxiety disorder: Secondary | ICD-10-CM | POA: Diagnosis not present

## 2015-09-27 NOTE — Progress Notes (Signed)
Patient ID: Katelyn [name], female   DOB: Sep 10, 1975, 40 y.o.   MRN: [id number] Discharge Note  Patient:  [name] is an 40 y.o., female DOB:  January 18, 1976  Date of Admission:  09/13/2015  Date of Discharge:  09/27/2015  Reason for Admission:depression  IOP Course:  [name] attended and participated.  Said she is less depressed but has had newer stressors that have impeded her progress.  Her son surprisingly failed EOG tests and the [place] trip had to be cancelled, she had problems with a best friend and also a new man she was interested in.  Michela Pitcher he feels she has to have a man in her life but with recent experiences she is rethinking that.  Eating disorder has flared up but she believes she is keeping it under control.    Mental Status at Discharge:not suicidal, though still depressed  Lab Results: No results found for this or any previous visit (from the past 74 hour(s)).   Current outpatient prescriptions:  .  clonazePAM (KLONOPIN) 0.5 MG tablet, TAKE 1 TABLET BY MOUTH 3 TIMES DAILY., Disp: 90 tablet, Rfl: 0 .  docusate sodium (COLACE) 50 MG capsule, Take 50 mg by mouth 2 (two) times daily. Reported on 04/24/2015, Disp: , Rfl:  .  FLUoxetine (PROZAC) 20 MG capsule, Take 3 capsule daily, Disp: 270 capsule, Rfl: 0 .  Magnesium 400 MG CAPS, Take 1 tablet by mouth. 1 tab daily, Disp: , Rfl:  .  Multiple Vitamin (MULTIVITAMIN WITH MINERALS) TABS tablet, Take 1 tablet by mouth every morning., Disp: , Rfl:  .  ondansetron (ZOFRAN) 4 MG tablet, Take 1 tablet (4 mg total) by mouth every 8 (eight) hours as needed for nausea or vomiting., Disp: 20 tablet, Rfl: 3 .  topiramate (TOPAMAX) 100 MG tablet, Take 1 tablet (100 mg total) by mouth 2 (two) times daily., Disp: 360 tablet, Rfl: 3 .  ziprasidone (GEODON) 60 MG capsule, Take 1 capsule (60 mg total) by mouth at bedtime., Disp: 90 capsule, Rfl: 0  Axis Diagnosis:  Major depression, recurrent, moderate.  GAD, PTSP, eating disorder currently  anorexic   Level of Care:  IOP  Discharge destination:  Other:  has appointments with psychiatrist and therapist  Is patient on multiple antipsychotic therapies at discharge:  No    Has Patient had three or more failed trials of antipsychotic monotherapy by history:  Negative  Patient phone:  [phone] (home)  Patient address:   [address] [place] [address],   Follow-up recommendations:  Activity:  continue current activity Diet:  continue current diet  Comments:  none  The patient received suicide prevention pamphlet:  Yes   Donnelly Angelica 09/27/2015, 9:09 AM

## 2015-09-27 NOTE — Patient Instructions (Signed)
Patient complete MH-IOP today.  F/U with Dr. Adele Schilder on 10-15-15 @ 2:30 pm and Gracelyn Nurse, LCSW on 10-18-15 pm.  Encourage support groups.  DBT once a week.
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
## 2015-09-28 ENCOUNTER — Other Ambulatory Visit (HOSPITAL_COMMUNITY): Payer: 59

## 2015-09-28 NOTE — Progress Notes (Signed)
    Daily Group Progress Note  Program: IOP  Group Time: 9:00-12:00   Participation Level:  minimal   Behavioral Response:  flat   Type of Therapy:  group therapy   Summary of Progress: Client stated she was able to recognize and connect her sickness/discomfort the previous day with the lack of food intake.  Client was able to consume food to make herself feel better.  Client was encouraged to continue to listen to her body and respond accordingly.  Client was discharged.  Nancie Neas, LPC

## 2015-10-01 ENCOUNTER — Other Ambulatory Visit (HOSPITAL_COMMUNITY): Payer: 59

## 2015-10-02 ENCOUNTER — Other Ambulatory Visit (HOSPITAL_COMMUNITY): Payer: 59

## 2015-10-03 ENCOUNTER — Other Ambulatory Visit (HOSPITAL_COMMUNITY): Payer: 59

## 2015-10-04 ENCOUNTER — Other Ambulatory Visit (HOSPITAL_COMMUNITY): Payer: 59

## 2015-10-05 ENCOUNTER — Other Ambulatory Visit (HOSPITAL_COMMUNITY): Payer: 59

## 2015-10-08 ENCOUNTER — Other Ambulatory Visit (HOSPITAL_COMMUNITY): Payer: 59

## 2015-10-09 ENCOUNTER — Other Ambulatory Visit (HOSPITAL_COMMUNITY): Payer: 59

## 2015-10-10 ENCOUNTER — Other Ambulatory Visit (HOSPITAL_COMMUNITY): Payer: 59

## 2015-10-11 ENCOUNTER — Other Ambulatory Visit (HOSPITAL_COMMUNITY): Payer: 59

## 2015-10-12 ENCOUNTER — Other Ambulatory Visit (HOSPITAL_COMMUNITY): Payer: 59

## 2015-10-14 ENCOUNTER — Other Ambulatory Visit (HOSPITAL_COMMUNITY): Payer: Self-pay | Admitting: Psychiatry

## 2015-10-15 ENCOUNTER — Encounter (HOSPITAL_COMMUNITY): Payer: Self-pay | Admitting: Psychiatry

## 2015-10-15 ENCOUNTER — Ambulatory Visit (INDEPENDENT_AMBULATORY_CARE_PROVIDER_SITE_OTHER): Payer: 59 | Admitting: Psychiatry

## 2015-10-15 ENCOUNTER — Encounter: Payer: Self-pay | Admitting: Family Medicine

## 2015-10-15 ENCOUNTER — Other Ambulatory Visit (HOSPITAL_COMMUNITY): Payer: 59

## 2015-10-15 ENCOUNTER — Ambulatory Visit (INDEPENDENT_AMBULATORY_CARE_PROVIDER_SITE_OTHER): Payer: Managed Care, Other (non HMO) | Admitting: Family Medicine

## 2015-10-15 VITALS — BP 108/60 | HR 78 | Resp 16 | Ht 66.0 in | Wt 152.0 lb

## 2015-10-15 VITALS — BP 116/66 | HR 74 | Ht 65.5 in | Wt 153.2 lb

## 2015-10-15 DIAGNOSIS — R51 Headache: Secondary | ICD-10-CM

## 2015-10-15 DIAGNOSIS — F331 Major depressive disorder, recurrent, moderate: Secondary | ICD-10-CM | POA: Diagnosis not present

## 2015-10-15 DIAGNOSIS — F509 Eating disorder, unspecified: Secondary | ICD-10-CM | POA: Diagnosis not present

## 2015-10-15 DIAGNOSIS — F411 Generalized anxiety disorder: Secondary | ICD-10-CM

## 2015-10-15 DIAGNOSIS — N760 Acute vaginitis: Secondary | ICD-10-CM | POA: Diagnosis not present

## 2015-10-15 DIAGNOSIS — D649 Anemia, unspecified: Secondary | ICD-10-CM

## 2015-10-15 DIAGNOSIS — J0110 Acute frontal sinusitis, unspecified: Secondary | ICD-10-CM

## 2015-10-15 DIAGNOSIS — B3731 Acute candidiasis of vulva and vagina: Secondary | ICD-10-CM | POA: Insufficient documentation

## 2015-10-15 DIAGNOSIS — F333 Major depressive disorder, recurrent, severe with psychotic symptoms: Secondary | ICD-10-CM

## 2015-10-15 DIAGNOSIS — B373 Candidiasis of vulva and vagina: Secondary | ICD-10-CM | POA: Insufficient documentation

## 2015-10-15 DIAGNOSIS — G8929 Other chronic pain: Secondary | ICD-10-CM

## 2015-10-15 DIAGNOSIS — R519 Headache, unspecified: Secondary | ICD-10-CM

## 2015-10-15 LAB — CBC
HEMATOCRIT: 36.5 % (ref 35.0–45.0)
HEMOGLOBIN: 11.7 g/dL (ref 11.7–15.5)
MCH: 29.3 pg (ref 27.0–33.0)
MCHC: 32.1 g/dL (ref 32.0–36.0)
MCV: 91.3 fL (ref 80.0–100.0)
MPV: 9.2 fL (ref 7.5–12.5)
Platelets: 365 10*3/uL (ref 140–400)
RBC: 4 MIL/uL (ref 3.80–5.10)
RDW: 14.6 % (ref 11.0–15.0)
WBC: 8.2 10*3/uL (ref 3.8–10.8)

## 2015-10-15 LAB — IRON: IRON: 24 ug/dL — AB (ref 40–190)

## 2015-10-15 LAB — FERRITIN: FERRITIN: 5 ng/mL — AB (ref 10–232)

## 2015-10-15 MED ORDER — FLUCONAZOLE 150 MG PO TABS: ORAL_TABLET | ORAL | Status: DC

## 2015-10-15 MED ORDER — DOXYCYCLINE HYCLATE 100 MG PO TABS: 100.0000 mg | ORAL_TABLET | Freq: Two times a day (BID) | ORAL | Status: DC

## 2015-10-15 MED ORDER — FLUOXETINE HCL 20 MG PO CAPS: ORAL_CAPSULE | ORAL | Status: DC

## 2015-10-15 MED ORDER — CLONAZEPAM 0.5 MG PO TABS: ORAL_TABLET | ORAL | Status: DC

## 2015-10-15 MED ORDER — ZIPRASIDONE HCL 60 MG PO CAPS: 60.0000 mg | ORAL_CAPSULE | Freq: Every day | ORAL | Status: DC

## 2015-10-15 NOTE — Assessment & Plan Note (Signed)
Symptomatic, fluconazole prescribed 1 tab only for use before the antibiotic
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
## 2015-10-15 NOTE — Assessment & Plan Note (Signed)
Weight stable , no longer in therapy, doing well at this time

## 2015-10-15 NOTE — Assessment & Plan Note (Signed)
Closely followed by psychiatry, currently stable though had a recent adverse event but fortunately sought help in a timely manner

## 2015-10-15 NOTE — Progress Notes (Signed)
   Subjective:    Patient ID: Katelyn [name], female    DOB: Sep 08, 1975, 40 y.o.   MRN: [id number]  HPI   Katelyn [name]     MRN: [id number]      DOB: 1976/02/27   HPI [name] is here for follow up and re-evaluation of chronic medical conditions, medication management and review of any available recent lab and radiology data.  Preventive health is updated, specifically  Cancer screening and Immunization.   Questions or concerns regarding consultations or procedures which the PT has had in the interim are  Addressed.Being followed closely by mental health and reports recent unfortunate incident which she is engaged in therapy for No longer getting support for eating disorder and weight is now stable. The PT denies any adverse reactions to current medications since the last visit.  1 week h/o frontal pressure, and now has cough productive of brown sputum. C/o vaginal itch and needs medication for this   ROS Denies recent fever or chills. re throat. Denies chest congestion, productive cough or wheezing. Denies chest pains, palpitations and leg swelling Denies abdominal pain, nausea, vomiting,diarrhea or constipation.   Denies dysuria, frequency, hesitancy or incontinence. Denies joint pain, swelling and limitation in mobility. Denies , seizures, numbness, or tingling. Denies uncontrolled  depression, anxiety or insomnia. Denies skin break down or rash.   PE  BP 108/60 mmHg  Pulse 78  Resp 16  Ht 5\' 6"  (1.676 m)  Wt 152 lb (68.947 kg)  BMI 24.55 kg/m2  SpO2 98%  Patient alert and oriented and in no cardiopulmonary distress.  HEENT: No facial asymmetry, EOMI,   oropharynx pink and moist.  Neck supple no JVD, no mass. Frontal sinus tenderness Chest: Clear to auscultation bilaterally.  CVS: S1, S2 no murmurs, no S3.Regular rate.  ABD: Soft non tender.   Ext: No edema  MS: Adequate ROM spine, shoulders, hips and knees.  Skin: Intact, no ulcerations or rash  noted.  Psych: Good eye contact, normal affect. Memory intact not anxious or depressed appearing.  CNS: CN 2-12 intact, power,  normal throughout.no focal deficits noted.   Assessment & Plan   Acute frontal sinusitis Antibiotic prescribed, and fluconazole  Vaginitis and vulvovaginitis Symptomatic, fluconazole prescribed 1 tab only for use before the antibiotic  Iron deficiency anemia Updated lab needed at/ before next visit. Though HB was normal, her ferritin was low, needs to be re checked today  Eating disorder Weight stable , no longer in therapy, doing well at this time  Generalized anxiety disorder Controlled, no change in medication   Chronic headaches Controlled, no change in medication   Depression, major, recurrent, severe with psychosis Closely followed by psychiatry, currently stable though had a recent adverse event but fortunately sought help in a timely manner       Review of Systems     Objective:   Physical Exam        Assessment & Plan:

## 2015-10-15 NOTE — Assessment & Plan Note (Signed)
Updated lab needed at/ before next visit. Though HB was normal, her ferritin was low, needs to be re checked today

## 2015-10-15 NOTE — Assessment & Plan Note (Signed)
Controlled, no change in medication  

## 2015-10-15 NOTE — Patient Instructions (Signed)
Annual wellness in 3.5 month, call if yoyu need me sooner  CBC, iron and ferritin today  Antibiotic and flyconazole prescribed as discussed  Thankful you are improving  Thank you  for choosing [facility]. We consider it a privelige to serve you.  Delivering excellent health care in a caring and  compassionate way is our goal.  Partnering with you,  so that together we can achieve this goal is our strategy.

## 2015-10-15 NOTE — Assessment & Plan Note (Signed)
Antibiotic prescribed, and fluconazole

## 2015-10-16 ENCOUNTER — Other Ambulatory Visit (HOSPITAL_COMMUNITY): Payer: 59

## 2015-10-16 DIAGNOSIS — R102 Pelvic and perineal pain: Secondary | ICD-10-CM | POA: Diagnosis not present

## 2015-10-16 DIAGNOSIS — Z3202 Encounter for pregnancy test, result negative: Secondary | ICD-10-CM | POA: Diagnosis not present

## 2015-10-16 DIAGNOSIS — Z30431 Encounter for routine checking of intrauterine contraceptive device: Secondary | ICD-10-CM | POA: Diagnosis not present

## 2015-10-16 NOTE — Addendum Note (Signed)
Addended by: Eual Fines on: 10/16/2015 02:29 PM   Modules accepted: Orders

## 2015-10-17 ENCOUNTER — Other Ambulatory Visit (HOSPITAL_COMMUNITY): Payer: 59

## 2015-10-17 DIAGNOSIS — R630 Anorexia: Secondary | ICD-10-CM | POA: Diagnosis not present

## 2015-10-17 DIAGNOSIS — F3112 Bipolar disorder, current episode manic without psychotic features, moderate: Secondary | ICD-10-CM | POA: Diagnosis not present

## 2015-10-17 DIAGNOSIS — F411 Generalized anxiety disorder: Secondary | ICD-10-CM | POA: Diagnosis not present

## 2015-10-18 ENCOUNTER — Other Ambulatory Visit (HOSPITAL_COMMUNITY): Payer: 59

## 2015-10-19 ENCOUNTER — Other Ambulatory Visit (HOSPITAL_COMMUNITY): Payer: 59

## 2015-10-22 ENCOUNTER — Other Ambulatory Visit (HOSPITAL_COMMUNITY): Payer: 59

## 2015-10-23 ENCOUNTER — Other Ambulatory Visit (HOSPITAL_COMMUNITY): Payer: 59

## 2015-10-24 ENCOUNTER — Other Ambulatory Visit (HOSPITAL_COMMUNITY): Payer: 59

## 2015-10-24 DIAGNOSIS — R630 Anorexia: Secondary | ICD-10-CM | POA: Diagnosis not present

## 2015-10-24 DIAGNOSIS — F411 Generalized anxiety disorder: Secondary | ICD-10-CM | POA: Diagnosis not present

## 2015-10-24 DIAGNOSIS — F3112 Bipolar disorder, current episode manic without psychotic features, moderate: Secondary | ICD-10-CM | POA: Diagnosis not present

## 2015-10-25 ENCOUNTER — Other Ambulatory Visit (HOSPITAL_COMMUNITY): Payer: 59

## 2015-10-26 ENCOUNTER — Other Ambulatory Visit (HOSPITAL_COMMUNITY): Payer: 59

## 2015-10-29 ENCOUNTER — Other Ambulatory Visit (HOSPITAL_COMMUNITY): Payer: 59

## 2015-10-31 ENCOUNTER — Other Ambulatory Visit (HOSPITAL_COMMUNITY): Payer: 59

## 2015-11-01 ENCOUNTER — Other Ambulatory Visit (HOSPITAL_COMMUNITY): Payer: 59

## 2015-11-02 ENCOUNTER — Other Ambulatory Visit (HOSPITAL_COMMUNITY): Payer: 59

## 2015-11-05 ENCOUNTER — Ambulatory Visit (HOSPITAL_COMMUNITY): Payer: Self-pay | Admitting: Psychiatry

## 2015-11-05 ENCOUNTER — Other Ambulatory Visit (HOSPITAL_COMMUNITY): Payer: 59

## 2015-11-06 ENCOUNTER — Other Ambulatory Visit (HOSPITAL_COMMUNITY): Payer: 59

## 2015-11-07 ENCOUNTER — Other Ambulatory Visit (HOSPITAL_COMMUNITY): Payer: 59

## 2015-11-07 ENCOUNTER — Emergency Department (HOSPITAL_COMMUNITY)
Admission: EM | Admit: 2015-11-07 | Discharge: 2015-11-08 | Disposition: A | Discharge status: Home / Self Care | Payer: Managed Care, Other (non HMO) | Attending: Emergency Medicine | Admitting: Emergency Medicine

## 2015-11-07 ENCOUNTER — Encounter (HOSPITAL_COMMUNITY): Payer: Self-pay

## 2015-11-07 DIAGNOSIS — Z6824 Body mass index (BMI) 24.0-24.9, adult: Secondary | ICD-10-CM | POA: Diagnosis not present

## 2015-11-07 DIAGNOSIS — F329 Major depressive disorder, single episode, unspecified: Secondary | ICD-10-CM | POA: Diagnosis not present

## 2015-11-07 DIAGNOSIS — Z87891 Personal history of nicotine dependence: Secondary | ICD-10-CM | POA: Insufficient documentation

## 2015-11-07 DIAGNOSIS — N12 Tubulo-interstitial nephritis, not specified as acute or chronic: Secondary | ICD-10-CM | POA: Diagnosis not present

## 2015-11-07 DIAGNOSIS — Z79899 Other long term (current) drug therapy: Secondary | ICD-10-CM | POA: Diagnosis not present

## 2015-11-07 DIAGNOSIS — E669 Obesity, unspecified: Secondary | ICD-10-CM | POA: Insufficient documentation

## 2015-11-07 DIAGNOSIS — R509 Fever, unspecified: Secondary | ICD-10-CM | POA: Diagnosis present

## 2015-11-07 LAB — I-STAT CG4 LACTIC ACID, ED: LACTIC ACID, VENOUS: 1.66 mmol/L (ref 0.5–1.9)

## 2015-11-07 LAB — CBC WITH DIFFERENTIAL/PLATELET
Basophils Absolute: 0 10*3/uL (ref 0.0–0.1)
Basophils Relative: 0 %
Eosinophils Absolute: 0 10*3/uL (ref 0.0–0.7)
Eosinophils Relative: 0 %
HEMATOCRIT: 36.9 % (ref 36.0–46.0)
HEMOGLOBIN: 11.9 g/dL — AB (ref 12.0–15.0)
LYMPHS ABS: 1.5 10*3/uL (ref 0.7–4.0)
LYMPHS PCT: 12 %
MCH: 29 pg (ref 26.0–34.0)
MCHC: 32.2 g/dL (ref 30.0–36.0)
MCV: 90 fL (ref 78.0–100.0)
MONO ABS: 1 10*3/uL (ref 0.1–1.0)
MONOS PCT: 8 %
NEUTROS ABS: 10.1 10*3/uL — AB (ref 1.7–7.7)
Neutrophils Relative %: 80 %
Platelets: 403 10*3/uL — ABNORMAL HIGH (ref 150–400)
RBC: 4.1 MIL/uL (ref 3.87–5.11)
RDW: 14.5 % (ref 11.5–15.5)
WBC: 12.6 10*3/uL — ABNORMAL HIGH (ref 4.0–10.5)

## 2015-11-07 LAB — URINALYSIS, ROUTINE W REFLEX MICROSCOPIC
BILIRUBIN URINE: NEGATIVE
GLUCOSE, UA: NEGATIVE mg/dL
Ketones, ur: NEGATIVE mg/dL
Nitrite: NEGATIVE
Protein, ur: 30 mg/dL — AB
SPECIFIC GRAVITY, URINE: 1.01 (ref 1.005–1.030)
pH: 7 (ref 5.0–8.0)

## 2015-11-07 LAB — COMPREHENSIVE METABOLIC PANEL
ALK PHOS: 90 U/L (ref 38–126)
ALT: 17 U/L (ref 14–54)
ANION GAP: 9 (ref 5–15)
AST: 19 U/L (ref 15–41)
Albumin: 4.3 g/dL (ref 3.5–5.0)
BILIRUBIN TOTAL: 0.7 mg/dL (ref 0.3–1.2)
BUN: 8 mg/dL (ref 6–20)
CALCIUM: 8.5 mg/dL — AB (ref 8.9–10.3)
CO2: 21 mmol/L — ABNORMAL LOW (ref 22–32)
Chloride: 107 mmol/L (ref 101–111)
Creatinine, Ser: 0.83 mg/dL (ref 0.44–1.00)
GFR calc Af Amer: >60 mL/min (ref >60)
Glucose, Bld: 126 mg/dL — ABNORMAL HIGH (ref 65–99)
POTASSIUM: 3 mmol/L — AB (ref 3.5–5.1)
Sodium: 137 mmol/L (ref 135–145)
TOTAL PROTEIN: 7.7 g/dL (ref 6.5–8.1)

## 2015-11-07 LAB — URINE MICROSCOPIC-ADD ON

## 2015-11-07 MED ORDER — ONDANSETRON HCL 4 MG/2ML IJ SOLN
4.0000 mg | Freq: Once | INTRAMUSCULAR | Status: AC
  Administered 2015-11-08: 4 mg via INTRAVENOUS
  Filled 2015-11-07: qty 2

## 2015-11-07 MED ORDER — DEXTROSE 5 % IV SOLN
1.0000 g | Freq: Once | INTRAVENOUS | Status: AC
  Administered 2015-11-08: 1 g via INTRAVENOUS
  Filled 2015-11-07: qty 10

## 2015-11-07 MED ORDER — SODIUM CHLORIDE 0.9 % IV BOLUS (SEPSIS)
1000.0000 mL | Freq: Once | INTRAVENOUS | Status: AC
Start: 2015-11-08 — End: 2015-11-08
  Administered 2015-11-08: 1000 mL via INTRAVENOUS

## 2015-11-07 MED ORDER — OXYCODONE-ACETAMINOPHEN 5-325 MG PO TABS
1.0000 | ORAL_TABLET | Freq: Once | ORAL | Status: AC
  Administered 2015-11-08: 1 via ORAL
  Filled 2015-11-07: qty 1

## 2015-11-07 NOTE — ED Provider Notes (Signed)
CSN: [id number]     Arrival date & time 11/07/15  2034 History  By signing my name below, I, Emmanuella Mensah, attest that this documentation has been prepared under the direction and in the presence of Merryl Hacker, MD. Electronically Signed: Judithann Sauger, ED Scribe. 11/07/2015. 11:53 PM.    Chief Complaint  Patient presents with  . Fever   Patient is a 40 y.o. female presenting with fever. The history is provided by the patient. No language interpreter was used.  Fever Max temp prior to arrival:  103.7 Temp source:  Oral Severity:  Moderate Onset quality:  Gradual Duration:  12 hours Timing:  Constant Progression:  Unchanged Relieved by:  Nothing Worsened by:  Nothing tried Ineffective treatments:  Acetaminophen Associated symptoms: dysuria and nausea   Associated symptoms: no chest pain, no rhinorrhea, no sore throat and no vomiting    HPI Comments: Katelyn [name] is a 40 y.o. female who presents to the Emergency Department complaining of gradually worsening 9/10 lower back pain onset today. Pt reports associated fever of 103.7 PTA, urinary frequency, and nausea. Pt adds that she has decreased appetite with sharp LLQ pain onset 4 days ago. No alleviating factors noted. Pt states that she has tried Tylenol for her fever with no relief. [name] LNMP was 11/02/15. She reports a hx of UTI, stating that these symptoms are consistent with when she had a UTI in the past. She denies any cough, rhinorrhea, sore throat, SOB, or chest pain.   Past Medical History  Diagnosis Date  . Depression   . Headache   . Obesity   . Skin lesion     Excisional biopsy of moles - none cancerous  . Polycystic ovary     takes metformin to treat  . Anxiety   . Chronic headaches   . History of pneumonia     x 3  years ago - no recent problems  . Benign juvenile melanoma   . Colon polyps     found on colonoscopy 04/26/2012  . Complication of anesthesia     itching after epidural for c section   . Constipation   . Pneumonia   . Heart murmur     as a child - no one mentions hearing murmur anymore  . Sleep apnea     yrs ago - diagnosed mild sleep apnea - did not have to use cpap   . Heartburn     no meds  . Depression     several suicide attempts, hospitaluzed in 2012 for this; hx pf ECT treatments ; pt sees Dr. Adele Schilder pyschiatrist  and doing well on medication   Past Surgical History  Procedure Laterality Date  . Roux-en-y procedure  08/28/14  . Tonsillectomy    . C sections  08/28/14    x 2  . Kidney stent  08/28/14  . Tonsillectomy  age 7  . Cystoscopy with urethral dilatation  age 32   . Skin lesion excision      back  . Cesarean section  2004, 2007    x 2  . Mole excision      "benign juvenile melanoma" removed from left leg - inner thigh  . Breath tek h pylori N/A 07/22/2013    Procedure: BREATH TEK H PYLORI;  Surgeon: Edward Jolly, MD;  Location: [facility];  Service: General;  Laterality: N/A;  . Gastric roux-en-y N/A 10/31/2013    Procedure: LAPAROSCOPIC ROUX-EN-Y GASTRIC BYPASS WITH UPPER ENDOSCOPY;  Surgeon:  Edward Jolly, MD;  Location: [facility];  Service: General;  Laterality: N/A;   Family History  Problem Relation Age of Onset  . Hypercholesterolemia Mother   . Hypertension Mother     Iterstitial Cystist  . Hyperlipidemia Mother   . Cancer Mother 95    breast   . Depression Brother   . Alcohol abuse Brother   . Colon polyps Father   . Depression Father   . Irritable bowel syndrome Father   . Alcohol abuse Father   . Colon cancer Paternal Aunt 34  . Heart attack Paternal Grandfather   . Kidney cancer Paternal Grandfather   . Cancer Maternal Grandfather     unknown type   Social History  Substance Use Topics  . Smoking status: Former Smoker -- 1.00 packs/day for 5 years    Types: Cigarettes    Quit date: 08/26/1997  . Smokeless tobacco: Never Used  . Alcohol Use: No   OB History    Gravida Para Term Preterm AB TAB SAB Ectopic  Multiple Living   0 0 0 0 0 0 0 0       Review of Systems  Constitutional: Positive for fever.  HENT: Negative for rhinorrhea and sore throat.   Respiratory: Negative for shortness of breath.   Cardiovascular: Negative for chest pain.  Gastrointestinal: Positive for nausea and abdominal pain. Negative for vomiting.  Genitourinary: Positive for dysuria and frequency.  Musculoskeletal: Positive for back pain.  All other systems reviewed and are negative.     Allergies  Dextrose; Lithium; Penicillins; Penicillins; and Other  Home Medications   Prior to Admission medications   Medication Sig Start Date End Date Taking? Authorizing Provider  ferrous sulfate 325 (65 FE) MG tablet Take 325 mg by mouth 2 (two) times daily with a meal.   Yes Historical Provider, MD  FLUoxetine (PROZAC) 20 MG capsule Take 3 capsule daily 10/15/15  Yes Kathlee Nations, MD  Magnesium 400 MG CAPS Take 1 tablet by mouth. 1 tab daily   Yes Historical Provider, MD  Multiple Vitamin (MULTIVITAMIN WITH MINERALS) TABS tablet Take 1 tablet by mouth every morning.   Yes Historical Provider, MD  topiramate (TOPAMAX) 100 MG tablet Take 1 tablet (100 mg total) by mouth 2 (two) times daily. 03/16/15  Yes Donika K Patel, DO  ziprasidone (GEODON) 60 MG capsule Take 1 capsule (60 mg total) by mouth at bedtime. 10/15/15  Yes Kathlee Nations, MD  cephALEXin (KEFLEX) 500 MG capsule Take 1 capsule (500 mg total) by mouth 3 (three) times daily. 11/08/15   Merryl Hacker, MD  clonazePAM (KLONOPIN) 0.5 MG tablet TAKE 1 TABLET BY MOUTH 3 TIMES DAILY. Patient not taking: Reported on 11/07/2015 10/15/15   Kathlee Nations, MD  doxycycline (VIBRA-TABS) 100 MG tablet Take 1 tablet (100 mg total) by mouth 2 (two) times daily. Patient not taking: Reported on 11/07/2015 10/15/15   Fayrene Helper, MD  HYDROcodone-acetaminophen (NORCO/VICODIN) 5-325 MG tablet Take 1 tablet by mouth every 6 (six) hours as needed. 11/08/15   Merryl Hacker, MD   ondansetron (ZOFRAN ODT) 4 MG disintegrating tablet Take 1 tablet (4 mg total) by mouth every 8 (eight) hours as needed for nausea or vomiting. 11/08/15   Merryl Hacker, MD   BP 118/74 mmHg  Pulse 97  Temp(Src) 102 F (38.9 C) (Oral)  Resp 18  Ht 5\' 5"  (1.651 m)  Wt 150 lb (68.04 kg)  BMI 24.96 kg/m2  SpO2  96%  LMP 10/31/2015 Physical Exam  Constitutional: She is oriented to person, place, and time. She appears well-developed and well-nourished. No distress.  HENT:  Head: Normocephalic and atraumatic.  Cardiovascular: Normal rate, regular rhythm and normal heart sounds.   No murmur heard. Pulmonary/Chest: Effort normal and breath sounds normal. No respiratory distress. She has no wheezes.  Abdominal: Soft. Bowel sounds are normal. There is no tenderness. There is no rebound and no guarding.  Genitourinary:  No CVA tenderness  Musculoskeletal:  Diffuse paraspinous muscle tenderness of the lower lumbar spine bilaterally  Neurological: She is alert and oriented to person, place, and time.  Skin: Skin is warm and dry.  Psychiatric: She has a normal mood and affect.  Nursing note and vitals reviewed.   ED Course  Procedures (including critical care time) DIAGNOSTIC STUDIES: Oxygen Saturation is 96% on RA, normal by my interpretation.    COORDINATION OF CARE: 11:50 PM- Pt advised of plan for treatment and pt agrees. Pt informed of lab results. She will receive IV fluids, Percocet, Zofran, and Rocephin.     Labs Review Labs Reviewed  CBC WITH DIFFERENTIAL/PLATELET - Abnormal; Notable for the following:    WBC 12.6 (*)    Hemoglobin 11.9 (*)    Platelets 403 (*)    Neutro Abs 10.1 (*)    All other components within normal limits  COMPREHENSIVE METABOLIC PANEL - Abnormal; Notable for the following:    Potassium 3.0 (*)    CO2 21 (*)    Glucose, Bld 126 (*)    Calcium 8.5 (*)    All other components within normal limits  URINALYSIS, ROUTINE W REFLEX MICROSCOPIC (NOT AT  [facility]) - Abnormal; Notable for the following:    Hgb urine dipstick TRACE (*)    Protein, ur 30 (*)    Leukocytes, UA TRACE (*)    All other components within normal limits  URINE MICROSCOPIC-ADD ON - Abnormal; Notable for the following:    Squamous Epithelial / LPF 0-5 (*)    Bacteria, UA MANY (*)    All other components within normal limits  I-STAT CG4 LACTIC ACID, ED - Abnormal; Notable for the following:    Lactic Acid, Venous 0.43 (*)    All other components within normal limits  URINE CULTURE  I-STAT CG4 LACTIC ACID, ED    Imaging Review No results found.   Merryl Hacker, MD has personally reviewed and evaluated these images and lab results as part of her medical decision-making.   EKG Interpretation None      MDM   Final diagnoses:  Pyelonephritis    Patient presents with back pain, dysuria, and fever. Temperature here 103 upon arrival. Mildly tachycardic. Ill-appearing but nontoxic. No CVA tenderness; however given back pain, pyelonephritis is certainly consideration. Urinalysis with 6-30 white cells and many bacteria. Urine culture sent. Lactate normal. Mild leukocytosis. Mild hypokalemia as well. Patient was given fluids and pain medication. She is able to tolerate by mouth. She was given 1 g of Rocephin. Will discharge him on Keflex with a short course of nausea and pain medication. Will treat for pyelonephritis. She does not appear septic and a course of outpatient antibiotics seems reasonable.  After history, exam, and medical workup I feel the patient has been appropriately medically screened and is safe for discharge home. Pertinent diagnoses were discussed with the patient. Patient was given return precautions.   I personally performed the services described in this documentation, which was scribed in my presence.  The recorded information has been reviewed and is accurate.   Merryl Hacker, MD 11/08/15 [phone]

## 2015-11-07 NOTE — ED Notes (Signed)
Fever, back pain, LLQ pain, headache and nausea started today.

## 2015-11-08 DIAGNOSIS — N12 Tubulo-interstitial nephritis, not specified as acute or chronic: Secondary | ICD-10-CM | POA: Diagnosis not present

## 2015-11-08 LAB — I-STAT CG4 LACTIC ACID, ED: LACTIC ACID, VENOUS: 0.43 mmol/L — AB (ref 0.5–1.9)

## 2015-11-08 MED ORDER — ONDANSETRON 4 MG PO TBDP: 4.0000 mg | ORAL_TABLET | Freq: Three times a day (TID) | ORAL | Status: DC | PRN

## 2015-11-08 MED ORDER — HYDROCODONE-ACETAMINOPHEN 5-325 MG PO TABS: 1.0000 | ORAL_TABLET | Freq: Four times a day (QID) | ORAL | Status: DC | PRN

## 2015-11-08 MED ORDER — CEPHALEXIN 500 MG PO CAPS: 500.0000 mg | ORAL_CAPSULE | Freq: Three times a day (TID) | ORAL | Status: DC

## 2015-11-08 MED ORDER — POTASSIUM CHLORIDE CRYS ER 20 MEQ PO TBCR
40.0000 meq | EXTENDED_RELEASE_TABLET | Freq: Once | ORAL | Status: AC
  Administered 2015-11-08: 40 meq via ORAL
  Filled 2015-11-08: qty 2

## 2015-11-08 NOTE — Discharge Instructions (Signed)

## 2015-11-08 NOTE — ED Notes (Signed)
Gave patient a diet gingerale as per doctor.

## 2015-11-09 DIAGNOSIS — R630 Anorexia: Secondary | ICD-10-CM | POA: Diagnosis not present

## 2015-11-09 DIAGNOSIS — F3112 Bipolar disorder, current episode manic without psychotic features, moderate: Secondary | ICD-10-CM | POA: Diagnosis not present

## 2015-11-09 DIAGNOSIS — F411 Generalized anxiety disorder: Secondary | ICD-10-CM | POA: Diagnosis not present

## 2015-11-12 ENCOUNTER — Telehealth: Payer: Self-pay

## 2015-11-12 LAB — URINE CULTURE: Culture: 50000 — AB

## 2015-11-12 MED ORDER — CIPROFLOXACIN HCL 500 MG PO TABS: 500.0000 mg | ORAL_TABLET | Freq: Two times a day (BID) | ORAL | Status: DC

## 2015-11-12 NOTE — Telephone Encounter (Signed)
From c/s needs cipro 500 mg one twice daily for 5 days please send in , may stop the keflex

## 2015-11-12 NOTE — Telephone Encounter (Signed)
Patient aware and medication change sent to pharmacy.

## 2015-11-12 NOTE — Addendum Note (Signed)
Addended by: Denman George B on: 11/12/2015 03:59 PM   Modules accepted: Orders

## 2015-11-13 ENCOUNTER — Telehealth: Payer: Self-pay | Admitting: *Deleted

## 2015-11-13 NOTE — Telephone Encounter (Signed)
Post ED Visit - Positive Culture Follow-up  Culture report reviewed by antimicrobial stewardship pharmacist:  []  Katelyn [name], Pharm.D. []  Katelyn [name], Pharm.D., BCPS []  Katelyn [name], Pharm.D. [x]  Katelyn [name], Pharm.D., BCPS []  Katelyn [name], [place].D., BCPS, AAHIVP []  Katelyn [name], Pharm.D., BCPS, AAHIVP []  Katelyn [name], Pharm.D. []  Katelyn [name], Pharm.D.  Positive urine culture Treated with Cephalexin, organism sensitive to the same and no further patient follow-up is required at this time.  [name] [facility] 11/13/2015, 10:36 AM

## 2015-11-16 DIAGNOSIS — F3112 Bipolar disorder, current episode manic without psychotic features, moderate: Secondary | ICD-10-CM | POA: Diagnosis not present

## 2015-11-16 DIAGNOSIS — R630 Anorexia: Secondary | ICD-10-CM | POA: Diagnosis not present

## 2015-11-16 DIAGNOSIS — F411 Generalized anxiety disorder: Secondary | ICD-10-CM | POA: Diagnosis not present

## 2015-11-19 DIAGNOSIS — R102 Pelvic and perineal pain: Secondary | ICD-10-CM | POA: Diagnosis not present

## 2015-11-19 DIAGNOSIS — N760 Acute vaginitis: Secondary | ICD-10-CM | POA: Diagnosis not present

## 2015-11-19 DIAGNOSIS — Z113 Encounter for screening for infections with a predominantly sexual mode of transmission: Secondary | ICD-10-CM | POA: Diagnosis not present

## 2015-11-21 DIAGNOSIS — F3112 Bipolar disorder, current episode manic without psychotic features, moderate: Secondary | ICD-10-CM | POA: Diagnosis not present

## 2015-11-21 DIAGNOSIS — F411 Generalized anxiety disorder: Secondary | ICD-10-CM | POA: Diagnosis not present

## 2015-11-21 DIAGNOSIS — R630 Anorexia: Secondary | ICD-10-CM | POA: Diagnosis not present

## 2015-11-23 ENCOUNTER — Telehealth: Payer: Self-pay | Admitting: Family Medicine

## 2015-11-23 DIAGNOSIS — N3000 Acute cystitis without hematuria: Secondary | ICD-10-CM

## 2015-11-23 NOTE — Telephone Encounter (Signed)
Do you want her to come in for a nurse visit to recheck urine or an OV since she has fever?

## 2015-11-23 NOTE — Telephone Encounter (Signed)
Order sent to lab

## 2015-11-23 NOTE — Telephone Encounter (Signed)
Katelyn [name] is stating she has finished the antibiotics given for the UTI, still c/o lower abdominal and back pain and still running fever, Please advise?

## 2015-11-23 NOTE — Telephone Encounter (Signed)
Pt unable to come in this pm due to home situation  Pls order urine c/s and send to lab, she should submit this no later than tomorrow, I also advised her to start no antibuiotic until specimen submitted , I am sending in a 3 day course of antibiotic for her

## 2015-11-25 ENCOUNTER — Encounter: Payer: Self-pay | Admitting: Family Medicine

## 2015-11-25 LAB — URINE CULTURE: Organism ID, Bacteria: NO GROWTH

## 2015-11-27 ENCOUNTER — Telehealth: Payer: Self-pay

## 2015-11-27 NOTE — Telephone Encounter (Signed)
Patient states that she has not run a fever today or yesterday.  Will call back by Thursday if problems persist.

## 2015-11-27 NOTE — Telephone Encounter (Signed)
Needs to be seen in office today

## 2015-11-29 DIAGNOSIS — F411 Generalized anxiety disorder: Secondary | ICD-10-CM | POA: Diagnosis not present

## 2015-11-29 DIAGNOSIS — R509 Fever, unspecified: Secondary | ICD-10-CM | POA: Diagnosis not present

## 2015-11-29 DIAGNOSIS — R109 Unspecified abdominal pain: Secondary | ICD-10-CM | POA: Diagnosis not present

## 2015-11-29 DIAGNOSIS — N898 Other specified noninflammatory disorders of vagina: Secondary | ICD-10-CM | POA: Diagnosis not present

## 2015-11-29 DIAGNOSIS — R630 Anorexia: Secondary | ICD-10-CM | POA: Diagnosis not present

## 2015-11-29 DIAGNOSIS — F3112 Bipolar disorder, current episode manic without psychotic features, moderate: Secondary | ICD-10-CM | POA: Diagnosis not present

## 2015-12-06 DIAGNOSIS — F411 Generalized anxiety disorder: Secondary | ICD-10-CM | POA: Diagnosis not present

## 2015-12-06 DIAGNOSIS — F3112 Bipolar disorder, current episode manic without psychotic features, moderate: Secondary | ICD-10-CM | POA: Diagnosis not present

## 2015-12-06 DIAGNOSIS — R630 Anorexia: Secondary | ICD-10-CM | POA: Diagnosis not present

## 2015-12-10 ENCOUNTER — Ambulatory Visit (HOSPITAL_COMMUNITY): Payer: Self-pay | Admitting: Psychiatry

## 2015-12-13 DIAGNOSIS — R102 Pelvic and perineal pain: Secondary | ICD-10-CM | POA: Diagnosis not present

## 2015-12-13 DIAGNOSIS — R300 Dysuria: Secondary | ICD-10-CM | POA: Diagnosis not present

## 2015-12-13 DIAGNOSIS — R509 Fever, unspecified: Secondary | ICD-10-CM | POA: Diagnosis not present

## 2015-12-13 DIAGNOSIS — N898 Other specified noninflammatory disorders of vagina: Secondary | ICD-10-CM | POA: Diagnosis not present

## 2015-12-14 DIAGNOSIS — F411 Generalized anxiety disorder: Secondary | ICD-10-CM | POA: Diagnosis not present

## 2015-12-14 DIAGNOSIS — R630 Anorexia: Secondary | ICD-10-CM | POA: Diagnosis not present

## 2015-12-14 DIAGNOSIS — F3112 Bipolar disorder, current episode manic without psychotic features, moderate: Secondary | ICD-10-CM | POA: Diagnosis not present

## 2015-12-18 ENCOUNTER — Encounter (HOSPITAL_COMMUNITY): Payer: Self-pay | Admitting: *Deleted

## 2015-12-18 ENCOUNTER — Emergency Department (HOSPITAL_COMMUNITY)
Admission: EM | Admit: 2015-12-18 | Discharge: 2015-12-18 | Disposition: A | Discharge status: Home / Self Care | Payer: Managed Care, Other (non HMO) | Attending: Emergency Medicine | Admitting: Emergency Medicine

## 2015-12-18 DIAGNOSIS — Z87891 Personal history of nicotine dependence: Secondary | ICD-10-CM | POA: Diagnosis not present

## 2015-12-18 DIAGNOSIS — R300 Dysuria: Secondary | ICD-10-CM | POA: Diagnosis not present

## 2015-12-18 DIAGNOSIS — R000 Tachycardia, unspecified: Secondary | ICD-10-CM | POA: Insufficient documentation

## 2015-12-18 DIAGNOSIS — R109 Unspecified abdominal pain: Secondary | ICD-10-CM | POA: Diagnosis not present

## 2015-12-18 DIAGNOSIS — N3090 Cystitis, unspecified without hematuria: Secondary | ICD-10-CM | POA: Diagnosis not present

## 2015-12-18 DIAGNOSIS — R509 Fever, unspecified: Secondary | ICD-10-CM | POA: Diagnosis not present

## 2015-12-18 DIAGNOSIS — R110 Nausea: Secondary | ICD-10-CM | POA: Diagnosis not present

## 2015-12-18 LAB — CBC WITH DIFFERENTIAL/PLATELET
Basophils Absolute: 0 10*3/uL (ref 0.0–0.1)
Basophils Relative: 0 %
EOS ABS: 0.1 10*3/uL (ref 0.0–0.7)
EOS PCT: 1 %
HCT: 30.8 % — ABNORMAL LOW (ref 36.0–46.0)
Hemoglobin: 10.1 g/dL — ABNORMAL LOW (ref 12.0–15.0)
LYMPHS ABS: 2.5 10*3/uL (ref 0.7–4.0)
LYMPHS PCT: 28 %
MCH: 28.5 pg (ref 26.0–34.0)
MCHC: 32.8 g/dL (ref 30.0–36.0)
MCV: 86.8 fL (ref 78.0–100.0)
MONO ABS: 0.9 10*3/uL (ref 0.1–1.0)
MONOS PCT: 10 %
Neutro Abs: 5.4 10*3/uL (ref 1.7–7.7)
Neutrophils Relative %: 61 %
PLATELETS: 354 10*3/uL (ref 150–400)
RBC: 3.55 MIL/uL — AB (ref 3.87–5.11)
RDW: 15.3 % (ref 11.5–15.5)
WBC: 8.9 10*3/uL (ref 4.0–10.5)

## 2015-12-18 LAB — COMPREHENSIVE METABOLIC PANEL
ALBUMIN: 4.1 g/dL (ref 3.5–5.0)
ALK PHOS: 70 U/L (ref 38–126)
ALT: 28 U/L (ref 14–54)
AST: 34 U/L (ref 15–41)
Anion gap: 5 (ref 5–15)
BUN: 16 mg/dL (ref 6–20)
CALCIUM: 8.4 mg/dL — AB (ref 8.9–10.3)
CHLORIDE: 109 mmol/L (ref 101–111)
CO2: 23 mmol/L (ref 22–32)
CREATININE: 0.94 mg/dL (ref 0.44–1.00)
GFR calc Af Amer: >60 mL/min (ref >60)
GFR calc non Af Amer: >60 mL/min (ref >60)
GLUCOSE: 77 mg/dL (ref 65–99)
Potassium: 3.5 mmol/L (ref 3.5–5.1)
SODIUM: 137 mmol/L (ref 135–145)
Total Bilirubin: 0.2 mg/dL — ABNORMAL LOW (ref 0.3–1.2)
Total Protein: 6.8 g/dL (ref 6.5–8.1)

## 2015-12-18 LAB — URINALYSIS, ROUTINE W REFLEX MICROSCOPIC
BILIRUBIN URINE: NEGATIVE
GLUCOSE, UA: NEGATIVE mg/dL
HGB URINE DIPSTICK: NEGATIVE
KETONES UR: NEGATIVE mg/dL
Leukocytes, UA: NEGATIVE
Nitrite: NEGATIVE
Protein, ur: 30 mg/dL — AB
Specific Gravity, Urine: >1.03 — ABNORMAL HIGH (ref 1.005–1.030)
pH: 6 (ref 5.0–8.0)

## 2015-12-18 LAB — URINE MICROSCOPIC-ADD ON

## 2015-12-18 LAB — LACTIC ACID, PLASMA: Lactic Acid, Venous: 0.7 mmol/L (ref 0.5–1.9)

## 2015-12-18 LAB — LIPASE, BLOOD: Lipase: 45 U/L (ref 11–51)

## 2015-12-18 MED ORDER — SODIUM CHLORIDE 0.9 % IV BOLUS (SEPSIS)
1000.0000 mL | Freq: Once | INTRAVENOUS | Status: AC
  Administered 2015-12-18: 1000 mL via INTRAVENOUS

## 2015-12-18 MED ORDER — SODIUM CHLORIDE 0.9 % IV BOLUS (SEPSIS)
1000.0000 mL | Freq: Once | INTRAVENOUS | Status: AC
Start: 2015-12-18 — End: 2015-12-18
  Administered 2015-12-18: 1000 mL via INTRAVENOUS

## 2015-12-18 NOTE — ED Triage Notes (Signed)
Pt brought in by [facility] for c/o tachycardia; pt states she has been dealing with a uti x 6 weeks and felt her heart racing today; when she took her pulse at home it was in the 170's, ems found pulse to be in 130's with a temp of 101; pt was given zofran 4mg  en route to ED

## 2015-12-18 NOTE — ED Provider Notes (Signed)
Emergency Department Provider Note   I have reviewed the triage vital signs and the nursing notes.   HISTORY  Chief Complaint Tachycardia   HPI Katelyn [name] is a 40 y.o. female with PMH of anxiety, chronic HA, depression, and intermittent UTI with 6 weeks of persistent fever presents to the ED for evaluation of intermittent dysuria and continued fever. The patient was seen by her OB/GYN provider last week with labs drawn at that time showing a urinary tract infection. She has been on multiple rounds of antibiotics and had multiple urine cultures growing positive Escherichia coli that is pan-sensitive. She has been referred to a urologist with an appointment next week. She denies abdominal pain and reports some mild right sided lower back pain. She had a pelvic ultrasound today to assess the position of her IUD which was reportedly normal. With continued fever today of 68 F the patient presented to the emergency department for further evaluation. She denies any new symptoms such as vomiting, diarrhea, productive cough, shortness of breath, chest pain. No vaginal discharge or bleeding.    Past Medical History:  Diagnosis Date  . Anxiety   . Benign juvenile melanoma   . Chronic headaches   . Colon polyps    found on colonoscopy 04/26/2012  . Complication of anesthesia    itching after epidural for c section  . Constipation   . Depression   . Depression    several suicide attempts, hospitaluzed in 2012 for this; hx pf ECT treatments ; pt sees Dr. Adele Schilder pyschiatrist  and doing well on medication  . Headache   . Heart murmur    as a child - no one mentions hearing murmur anymore  . Heartburn    no meds  . History of pneumonia    x 3  years ago - no recent problems  . Obesity   . Pneumonia   . Polycystic ovary    takes metformin to treat  . Skin lesion    Excisional biopsy of moles - none cancerous  . Sleep apnea    yrs ago - diagnosed mild sleep apnea - did not have to  use cpap     Patient Active Problem List   Diagnosis Date Noted  . Acute frontal sinusitis 10/15/2015  . Vaginitis and vulvovaginitis 10/15/2015  . Iron deficiency anemia 06/10/2015  . Hypoglycemia 04/16/2015  . Sleep disorder 06/01/2013  . Generalized anxiety disorder 05/30/2013  . Chronic headaches 07/01/2012  . Depression, major, recurrent, severe with psychosis ([place]) 06/17/2012  . Family hx colonic polyps 03/03/2012  . Eating disorder 12/18/2011  . POLYCYSTIC OVARIAN DISEASE 09/01/2008    Past Surgical History:  Procedure Laterality Date  . BREATH TEK H PYLORI N/A 07/22/2013   Procedure: BREATH TEK H PYLORI;  Surgeon: Edward Jolly, MD;  Location: [facility];  Service: General;  Laterality: N/A;  . c sections  08/28/14   x 2  . CESAREAN SECTION  2004, 2007   x 2  . CYSTOSCOPY WITH URETHRAL DILATATION  age 26   . GASTRIC ROUX-EN-Y N/A 10/31/2013   Procedure: LAPAROSCOPIC ROUX-EN-Y GASTRIC BYPASS WITH UPPER ENDOSCOPY;  Surgeon: Edward Jolly, MD;  Location: [facility];  Service: General;  Laterality: N/A;  . kidney stent  08/28/14  . mole excision     "benign juvenile melanoma" removed from left leg - inner thigh  . ROUX-EN-Y PROCEDURE  08/28/14  . SKIN LESION EXCISION     back  . TONSILLECTOMY    .  TONSILLECTOMY  age 57    Current Outpatient Rx  . Order #: [id number] Class: Print  . Order #: [id number] Class: Historical Med  . Order #: [id number] Class: Normal  . Order #: [id number] Class: Print  . Order #: [id number] Class: Historical Med  . Order #: [id number] Class: Historical Med  . Order #: [id number] Class: Historical Med  . Order #: [id number] Class: Normal  . Order #: [id number] Class: Normal  . Order #: [id number] Class: Print  . Order #: [id number] Class: Normal  . Order #: [id number] Class: Print  . Order #: [id number] Class: Print    Allergies Penicillins  Family History  Problem Relation Age of Onset  . Hypercholesterolemia Mother   . Hypertension Mother      Iterstitial Cystist  . Hyperlipidemia Mother   . Cancer Mother 51    breast   . Depression Brother   . Alcohol abuse Brother   . Colon polyps Father   . Depression Father   . Irritable bowel syndrome Father   . Alcohol abuse Father   . Colon cancer Paternal Aunt 59  . Heart attack Paternal Grandfather   . Kidney cancer Paternal Grandfather   . Cancer Maternal Grandfather     unknown type    Social History Social History  Substance Use Topics  . Smoking status: Former Smoker    Packs/day: 1.00    Years: 5.00    Types: Cigarettes    Quit date: 08/26/1997  . Smokeless tobacco: Never Used  . Alcohol use No    Review of Systems  Constitutional: No chills. Positive persistent fever.  Eyes: No visual changes. ENT: No sore throat. Cardiovascular: Denies chest pain. Respiratory: Denies shortness of breath. Gastrointestinal: No abdominal pain.  No nausea, no vomiting.  No diarrhea.  No constipation. Genitourinary: Intermittent for dysuria. Musculoskeletal: Negative for back pain. Skin: Negative for rash. Neurological: Negative for headaches, focal weakness or numbness.  10-point ROS otherwise negative.  ____________________________________________   PHYSICAL EXAM:  VITAL SIGNS: ED Triage Vitals [12/18/15 1923]  Enc Vitals Group     BP 127/82     Pulse Rate 113     Resp 20     Temp 99.3 F (37.4 C)     Temp Source Oral     SpO2 97 %     Weight 152 lb (68.9 kg)     Height 5\' 5"  (1.651 m)     Pain Score 8   Constitutional: Alert and oriented. Well appearing and in no acute distress. Eyes: Conjunctivae are normal.  Head: Atraumatic. Nose: No congestion/rhinnorhea. Mouth/Throat: Mucous membranes are dry. Oropharynx non-erythematous. Neck: No stridor.   Cardiovascular: Sinus tachycardia. Good peripheral circulation. Grossly normal heart sounds.   Respiratory: Normal respiratory effort.  No retractions. Lungs CTAB. Gastrointestinal: Soft and nontender. No  distention. No CVA tenderness.  Musculoskeletal: No lower extremity tenderness nor edema. No gross deformities of extremities. Neurologic:  Normal speech and language. No gross focal neurologic deficits are appreciated.  Skin:  Skin is warm, dry and intact. No rash noted. Psychiatric: Mood and affect are normal. Speech and behavior are normal.  ____________________________________________   LABS (all labs ordered are listed, but only abnormal results are displayed)  Labs Reviewed  COMPREHENSIVE METABOLIC PANEL - Abnormal; Notable for the following:       Result Value   Calcium 8.4 (*)    Total Bilirubin 0.2 (*)    All other components within normal limits  CBC WITH DIFFERENTIAL/PLATELET - Abnormal; Notable for the following:    RBC 3.55 (*)  Hemoglobin 10.1 (*)    HCT 30.8 (*)    All other components within normal limits  URINALYSIS, ROUTINE W REFLEX MICROSCOPIC (NOT AT [facility]) - Abnormal; Notable for the following:    Specific Gravity, Urine >1.030 (*)    Protein, ur 30 (*)    All other components within normal limits  URINE MICROSCOPIC-ADD ON - Abnormal; Notable for the following:    Squamous Epithelial / LPF 6-30 (*)    Bacteria, UA RARE (*)    All other components within normal limits  CULTURE, BLOOD (ROUTINE X 2)  CULTURE, BLOOD (ROUTINE X 2)  LIPASE, BLOOD  LACTIC ACID, PLASMA   ____________________________________________  EKG    EKG Interpretation  Date/Time:  Tuesday December 18 2015 19:32:22 EDT Ventricular Rate:  108 PR Interval:    QRS Duration: 78 QT Interval:  326 QTC Calculation: 437 R Axis:   48 Text Interpretation:  Sinus tachycardia No STEMI.  Confirmed by Fallynn Gravett MD, Adira Limburg [phone]) on 12/18/2015 8:21:44 PM       ____________________________________________  RADIOLOGY  None ____________________________________________   PROCEDURES  Procedure(s) performed:   Procedures  None ____________________________________________   INITIAL  IMPRESSION / ASSESSMENT AND PLAN / ED COURSE  Pertinent labs & imaging results that were available during my care of the patient were reviewed by me and considered in my medical decision making (see chart for details).  Patient presents to the emergency department for evaluation of persistent fever, intermittent dysuria, sinus tachycardia the setting of recurrent urinary tract infection. Prior urine cultures have grown Escherichia coli sensitive to multiple antibiotics. Patient has been treated in the emergency department with Rocephin IV and Keflex. She is followed with her OB/GYN provider who perform pelvic ultrasound today that was reportedly normal. The patient has no abdominal tenderness to palpation. I do not feel this is an underlying intra-abdominal source of infection. The patient is scheduled to see a urologist. Have sent initial sepsis workup labs including lactate, blood cultures, urinalysis. Will give IV fluids. Patient is afebrile here. Normal mental status. Well perfused. No ischemia on EKG.   09:40 PM Patient with no evidence of urinary tract infection. Normal lactate. Normal kidney function. Tachycardia improving with IV fluids. Patient has been afebrile in the emergency department. I discussed with her my impression that I'm unsure why she continues to have fever. She is being seen by urologist in the near future. I discussed possible referral to an infectious disease specialist and the patient will discuss with her primary care physician. I see no clear indication to initiate new antibiotics or admission to the hospital at this time. Plan to complete IV fluids and discharge at this time.   At this time, I do not feel there is any life-threatening condition present. I have reviewed and discussed all results (EKG, imaging, lab, urine as appropriate), exam findings with patient. I have reviewed nursing notes and appropriate previous records.  I feel the patient is safe to be discharged home  without further emergent workup. Discussed usual and customary return precautions. Patient and family (if present) verbalize understanding and are comfortable with this plan.  Patient will follow-up with their primary care provider. If they do not have a primary care provider, information for follow-up has been provided to them. All questions have been answered.  ____________________________________________  FINAL CLINICAL IMPRESSION(S) / ED DIAGNOSES  Final diagnoses:  Tachycardia  Dysuria     MEDICATIONS GIVEN DURING THIS VISIT:  Medications  sodium chloride 0.9 % bolus 1,000 mL (  0 mLs Intravenous Stopped 12/18/15 2118)  sodium chloride 0.9 % bolus 1,000 mL (0 mLs Intravenous Stopped 12/18/15 2218)     NEW OUTPATIENT MEDICATIONS STARTED DURING THIS VISIT:  None   Note:  This document was prepared using Dragon voice recognition software and may include unintentional dictation errors.  Nanda Quinton, MD Emergency Medicine   Margette Fast, MD 12/19/15 1021

## 2015-12-18 NOTE — ED Notes (Signed)
Pt reports taking hydrocodone/apap 5/325 today and noticed HR up to 170s. Reports had never taken this medication before.

## 2015-12-18 NOTE — Discharge Instructions (Signed)
You were seen in the ED today with elevated heart rate and fever at home. I did not find an obvious source of infection to explain these symptoms. We are sending blood and urine for culture.   Follow up with your PCP this week to discuss further treatment and possible referral to an infectious disease physician. Return to the ED with any abdominal pain, confusion, weakness, chest pain, or difficulty breathing.

## 2015-12-19 ENCOUNTER — Encounter: Payer: Self-pay | Admitting: Family Medicine

## 2015-12-19 ENCOUNTER — Ambulatory Visit (INDEPENDENT_AMBULATORY_CARE_PROVIDER_SITE_OTHER): Payer: Managed Care, Other (non HMO) | Admitting: Family Medicine

## 2015-12-19 VITALS — BP 120/80 | HR 88 | Temp 99.6°F | Resp 16 | Ht 66.0 in | Wt 156.0 lb

## 2015-12-19 DIAGNOSIS — D509 Iron deficiency anemia, unspecified: Secondary | ICD-10-CM

## 2015-12-19 DIAGNOSIS — E559 Vitamin D deficiency, unspecified: Secondary | ICD-10-CM

## 2015-12-19 DIAGNOSIS — R509 Fever, unspecified: Secondary | ICD-10-CM

## 2015-12-19 DIAGNOSIS — F411 Generalized anxiety disorder: Secondary | ICD-10-CM

## 2015-12-19 DIAGNOSIS — Z8371 Family history of colonic polyps: Secondary | ICD-10-CM

## 2015-12-19 DIAGNOSIS — R000 Tachycardia, unspecified: Secondary | ICD-10-CM

## 2015-12-19 DIAGNOSIS — Z1211 Encounter for screening for malignant neoplasm of colon: Secondary | ICD-10-CM

## 2015-12-19 DIAGNOSIS — Z114 Encounter for screening for human immunodeficiency virus [HIV]: Secondary | ICD-10-CM

## 2015-12-19 DIAGNOSIS — I495 Sick sinus syndrome: Secondary | ICD-10-CM | POA: Insufficient documentation

## 2015-12-19 LAB — POC HEMOCCULT BLD/STL (OFFICE/1-CARD/DIAGNOSTIC): Fecal Occult Blood, POC: NEGATIVE

## 2015-12-19 LAB — FERRITIN: Ferritin: 5 ng/mL — ABNORMAL LOW (ref 10–232)

## 2015-12-19 LAB — IRON AND TIBC
%SAT: 3 % — ABNORMAL LOW (ref 11–50)
IRON: 12 ug/dL — AB (ref 40–190)
TIBC: 442 ug/dL (ref 250–450)
UIBC: 430 ug/dL — AB (ref 125–400)

## 2015-12-19 LAB — VITAMIN B12: VITAMIN B 12: 335 pg/mL (ref 200–1100)

## 2015-12-19 LAB — FOLATE: Folate: >24 ng/mL (ref >5.4)

## 2015-12-19 LAB — TSH: TSH: 1.17 m[IU]/L

## 2015-12-19 LAB — HIV ANTIBODY (ROUTINE TESTING W REFLEX): HIV 1&2 Ab, 4th Generation: NONREACTIVE

## 2015-12-19 NOTE — Assessment & Plan Note (Signed)
6 week h/o intermittent fever up to 105, but maintinaing low geradt temp of 99.6  Since that time to current Has had 2 uTI , still concerned about fever will get id inputI

## 2015-12-19 NOTE — Addendum Note (Signed)
Addended by: Eual Fines on: 12/19/2015 01:28 PM   Modules accepted: Orders

## 2015-12-19 NOTE — Progress Notes (Signed)
   Katelyn [name]     MRN: [id number]      DOB: [birth year]   HPI [name] is here for follow up and re-evaluation c/o 6 week h/o fever, intermittently as high as 105 and generally around 99.5, has had 2 bladder infections. Concerned about cause of fever being so prolonged, no current symptoms of infection, wishes to see ID, has upcoming urology appt. Twice since yesterday , acute tachycardia as fast as 170, seen in ED , had recurrence again this morning. No symptoms of anxiety or panic. Npo new medication denies uncontrolled psych symptoms  ROS . Denies sinus pressure, nasal congestion, ear pain or sore throat. Denies chest congestion, productive cough or wheezing. Denies chest pains, palpitations and leg swelling Denies abdominal pain, nausea, vomiting,diarrhea or constipation.   Denies dysuria, frequency, hesitancy or incontinence. Denies joint pain, swelling and limitation in mobility. Denies headaches, seizures, numbness, or tingling. Denies uncontrolled depression, anxiety or insomnia. Denies skin break down or rash.   PE  BP 120/80   Pulse 88   Temp 99.6 F (37.6 C) (Oral)   Resp 16   Ht 5\' 6"  (1.676 m)   Wt 156 lb (70.8 kg)   LMP 11/29/2015   SpO2 98%   BMI 25.18 kg/m   Patient alert and oriented and in no cardiopulmonary distress.  HEENT: No facial asymmetry, EOMI,   oropharynx pink and moist.  Neck supple no JVD, no mass.  Chest: Clear to auscultation bilaterally.  CVS: S1, S2 no murmurs, no S3.Regular rate.  ABD: Soft non tender.  Rectal: no mass heme negative stool Ext: No edema  MS: Adequate ROM spine, shoulders, hips and knees.  Skin: Intact, no ulcerations or rash noted.  Psych: Good eye contact, normal affect. Memory intact  anxious not depressed appearing.  CNS: CN 2-12 intact, power,  normal throughout.no focal deficits noted.   Assessment & Plan  Tachycardia Acute onset  Tachycardia yesterday rate up to 170, went to Ed by EMS, was 140  when eMS, remained over 100 in the eD. This morning  Again at 5am experienced tachycardia of 140 for approx 30 mins, no symptoms of panic attack at the time  Fever in adult 6 week h/o intermittent fever up to 105, but maintinaing low geradt temp of 99.6  Since that time to current Has had 2 uTI , still concerned about fever will get id inputI  Generalized anxiety disorder Reports controlled though mildly anxious at visit, managed by psych  Iron deficiency anemia Updated lab needed at/ before next visit.   Family hx colonic polyps rept colonoscopy due in 2018, heme negative stool at visit

## 2015-12-19 NOTE — Assessment & Plan Note (Signed)
rept colonoscopy due in 2018, heme negative stool at visit

## 2015-12-19 NOTE — Patient Instructions (Addendum)
F/u in 5 weeks , call if you need me sooner  Labs today  You are referred urgently to cardiology  You are referred to Infectious disease  No hidden blood in stool,  We will coonact  You with labresults

## 2015-12-19 NOTE — Assessment & Plan Note (Signed)
Acute onset  Tachycardia yesterday rate up to 170, went to Ed by EMS, was 140 when eMS, remained over 100 in the eD. This morning  Again at 5am experienced tachycardia of 140 for approx 30 mins, no symptoms of panic attack at the time

## 2015-12-19 NOTE — Assessment & Plan Note (Signed)
Reports controlled though mildly anxious at visit, managed by psych

## 2015-12-19 NOTE — Assessment & Plan Note (Signed)
Updated lab needed at/ before next visit.   

## 2015-12-20 ENCOUNTER — Other Ambulatory Visit: Payer: Self-pay | Admitting: Family Medicine

## 2015-12-20 ENCOUNTER — Telehealth: Payer: Self-pay | Admitting: Family Medicine

## 2015-12-20 DIAGNOSIS — F509 Eating disorder, unspecified: Secondary | ICD-10-CM

## 2015-12-20 DIAGNOSIS — R000 Tachycardia, unspecified: Secondary | ICD-10-CM

## 2015-12-20 DIAGNOSIS — D509 Iron deficiency anemia, unspecified: Secondary | ICD-10-CM

## 2015-12-20 LAB — VITAMIN D 25 HYDROXY (VIT D DEFICIENCY, FRACTURES): VIT D 25 HYDROXY: 45 ng/mL (ref 30–100)

## 2015-12-20 NOTE — Telephone Encounter (Signed)
Patient aware.

## 2015-12-20 NOTE — Telephone Encounter (Signed)
pls advise her that I suggest a trial of regular food small amounts for lunch and early dinner as she needs to eat to nourish her body Let her kniow I have referred her to gI ,  Dr Laural Golden as I told her ,  Let her know her lab results pls

## 2015-12-20 NOTE — Telephone Encounter (Signed)
[phone] pt called and said that Dr. Moshe Cipro told her to only eat protein shakes until she saw the cardiologist - however, they can't see her until 8/30 and she wanted to know if she could eat regular food or if Dr. Moshe Cipro still wanted her to eat protein shakes.   Also, she wants to know if we received her lab reports back. Please call her back on the number above.

## 2015-12-20 NOTE — Telephone Encounter (Signed)
Please advise about diet.

## 2015-12-20 NOTE — Telephone Encounter (Signed)
called at approx 18:50 on 08/24 stating that she became tachycardic in relation to meals. Had eaten approx 2 hrs before and sustained period of tachycardia of approx 30 mins up to 140 hR, she had not had lunch. I advised a protein shake for breakfast and that I would also refer her to GI Of note at visit she stated that she had missed last appt with psych as Provider canceled, and that her eating d/o was under control, I will send a msg alert to her psych as well

## 2015-12-22 ENCOUNTER — Encounter (INDEPENDENT_AMBULATORY_CARE_PROVIDER_SITE_OTHER): Payer: Self-pay | Admitting: Internal Medicine

## 2015-12-23 ENCOUNTER — Other Ambulatory Visit: Payer: Self-pay | Admitting: Family Medicine

## 2015-12-23 ENCOUNTER — Encounter: Payer: Self-pay | Admitting: Family Medicine

## 2015-12-23 LAB — CULTURE, BLOOD (ROUTINE X 2)
Culture: NO GROWTH
Culture: NO GROWTH

## 2015-12-23 MED ORDER — DICYCLOMINE HCL 10 MG PO CAPS: 10.0000 mg | ORAL_CAPSULE | Freq: Three times a day (TID) | ORAL | 2 refills | Status: DC

## 2015-12-26 NOTE — Progress Notes (Signed)
Cardiology Office Note   Date:  12/27/2015   ID:  Katelyn [name], DOB 10-29-1975, MRN [id number]  PCP:  [name], MD  Cardiologist:   Jenkins Rouge, MD   No chief complaint on file.     History of Present Illness: Katelyn [name] is a 40 y.o. female who presents for evaluation of tachycardia  Has anemia and dumping Syndrome with difficulty absorbing iron  Also sees psych and has eating disorder.  8/24 indicated tachycardia To 140 for 30 minutes about 2 hrs after eating . Also seen 8/23 complaining of fever to 105 with bladder infections  Seen in ER 8/22 and only in ST no atrial tach or PAF.  Improved with hydaration Pelvic US negative And no signs of sepsis   Has appt with urologist tomorrow still thinks she is having fevers.  Single mom with a 28 and 40 yo no ETOH, drugs , stimulants or smoking    Past Medical History:  Diagnosis Date  . Anxiety   . Benign juvenile melanoma   . Chronic headaches   . Colon polyps    found on colonoscopy 04/26/2012  . Complication of anesthesia    itching after epidural for c section  . Constipation   . Depression   . Depression    several suicide attempts, hospitaluzed in 2012 for this; hx pf ECT treatments ; pt sees Dr. Adele Schilder pyschiatrist  and doing well on medication  . Headache   . Heart murmur    as a child - no one mentions hearing murmur anymore  . Heartburn    no meds  . History of pneumonia    x 3  years ago - no recent problems  . Obesity   . Pneumonia   . Polycystic ovary    takes metformin to treat  . Skin lesion    Excisional biopsy of moles - none cancerous  . Sleep apnea    yrs ago - diagnosed mild sleep apnea - did not have to use cpap     Past Surgical History:  Procedure Laterality Date  . BREATH TEK H PYLORI N/A 07/22/2013   Procedure: BREATH TEK H PYLORI;  Surgeon: Edward Jolly, MD;  Location: [facility];  Service: General;  Laterality: N/A;  . c sections  08/28/14   x 2  . CESAREAN  SECTION  2004, 2007   x 2  . CYSTOSCOPY WITH URETHRAL DILATATION  age 39   . GASTRIC ROUX-EN-Y N/A 10/31/2013   Procedure: LAPAROSCOPIC ROUX-EN-Y GASTRIC BYPASS WITH UPPER ENDOSCOPY;  Surgeon: Edward Jolly, MD;  Location: [facility];  Service: General;  Laterality: N/A;  . kidney stent  08/28/14  . mole excision     "benign juvenile melanoma" removed from left leg - inner thigh  . ROUX-EN-Y PROCEDURE  08/28/14  . SKIN LESION EXCISION     back  . TONSILLECTOMY    . TONSILLECTOMY  age 29     Current Outpatient Prescriptions  Medication Sig Dispense Refill  . clonazePAM (KLONOPIN) 0.5 MG tablet TAKE 1 TABLET BY MOUTH 3 TIMES DAILY. (Patient taking differently: Take 0.5 mg by mouth 3 (three) times daily as needed for anxiety. ) 90 tablet 0  . dicyclomine (BENTYL) 10 MG capsule Take 1 capsule (10 mg total) by mouth 4 (four) times daily -  before meals and at bedtime. 120 capsule 2  . ferrous sulfate 325 (65 FE) MG tablet Take 325 mg by mouth 2 (two) times daily  with a meal.    . FLUoxetine (PROZAC) 20 MG capsule Take 3 capsule daily (Patient taking differently: Take 40 mg by mouth daily. ) 270 capsule 0  . HYDROcodone-acetaminophen (NORCO/VICODIN) 5-325 MG tablet Take 1 tablet by mouth every 6 (six) hours as needed. 6 tablet 0  . Magnesium 400 MG CAPS Take 1 tablet by mouth daily.     . Multiple Vitamin (MULTIVITAMIN WITH MINERALS) TABS tablet Take 1 tablet by mouth every morning.    . ondansetron (ZOFRAN ODT) 4 MG disintegrating tablet Take 1 tablet (4 mg total) by mouth every 8 (eight) hours as needed for nausea or vomiting. 20 tablet 0  . topiramate (TOPAMAX) 100 MG tablet Take 1 tablet (100 mg total) by mouth 2 (two) times daily. 360 tablet 3  . ziprasidone (GEODON) 60 MG capsule Take 1 capsule (60 mg total) by mouth at bedtime. 90 capsule 0   No current facility-administered medications for this visit.     Allergies:   Penicillins    Social History:  The patient  reports that she quit  smoking about 18 years ago. Her smoking use included Cigarettes. She has a 5.00 pack-year smoking history. She has never used smokeless tobacco. She reports that she does not drink alcohol or use drugs.   Family History:  The patient's family history includes Alcohol abuse in her brother and father; Cancer in her maternal grandfather; Cancer (age of onset: 22) in her mother; Colon cancer (age of onset: 25) in her paternal aunt; Colon polyps in her father; Depression in her brother and father; Heart attack in her paternal grandfather; Hypercholesterolemia in her mother; Hyperlipidemia in her mother; Hypertension in her mother; Irritable bowel syndrome in her father; Kidney cancer in her paternal grandfather.    ROS:  Please see the history of present illness.   Otherwise, review of systems are positive for none.   All other systems are reviewed and negative.    PHYSICAL EXAM: VS:  BP (!) 126/58 (BP Location: Right Arm)   Pulse (!) 105   Ht 5\' 5"  (1.651 m)   Wt 158 lb (71.7 kg)   LMP 11/29/2015   SpO2 96%   BMI 26.29 kg/m  , BMI Body mass index is 26.29 kg/m. Affect appropriate Healthy:  appears stated age 43: normal Neck supple with no adenopathy JVP normal no bruits no thyromegaly Lungs clear with no wheezing and good diaphragmatic motion Heart:  S1/S2 no murmur, no rub, gallop or click PMI normal Abdomen: benighn, BS positve, no tenderness, no AAA no bruit.  No HSM or HJR Distal pulses intact with no bruits No edema Neuro non-focal Skin warm and dry No muscular weakness    EKG:  12/18/15 ST rate 108 no abnormalities no pre excitation    Recent Labs: 12/27/2014: Magnesium 2.1 12/18/2015: ALT 28; BUN 16; Creatinine, Ser 0.94; Hemoglobin 10.1; Platelets 354; Potassium 3.5; Sodium 137 12/19/2015: TSH 1.17    Lipid Panel    Component Value Date/Time   CHOL 103 05/13/2014 0805   TRIG 61 05/13/2014 0805   HDL 34 (L) 05/13/2014 0805   CHOLHDL 3.0 05/13/2014 0805   VLDL 12  05/13/2014 0805   LDLCALC 57 05/13/2014 0805      Wt Readings from Last 3 Encounters:  12/27/15 158 lb (71.7 kg)  12/19/15 156 lb (70.8 kg)  12/18/15 152 lb (68.9 kg)      Other studies Reviewed: Additional studies/ records that were reviewed today include: ER notes labs ECG Notes  from Dr Moshe Cipro .    ASSESSMENT AND PLAN:  1.  Tachycardia seems physiologic from anemia , anxiety and dumping syndrome 48 hr holter to assess Average HR and make sure HR goes down at night 2. Murmur:  Radiates to back no history of congental dx echo to r/o SBE and coarctation 3. Dumping Syndrome:  F/u GI low gluten diet  4. UTI's  May need further w/u with continued fevers r/o pylonephritis seeing urology in am    Current medicines are reviewed at length with the patient today.  The patient does not have concerns regarding medicines.  The following changes have been made:  no change  Labs/ tests ordered today include: 48 hr Holter and echo  No orders of the defined types were placed in this encounter.    Disposition:   FU with NP in a few weeks after tests      Signed, Jenkins Rouge, MD  12/27/2015 8:55 AM    [facility] [address], [place], [place]  [address] Phone: [phone]; Fax: [phone]

## 2015-12-27 ENCOUNTER — Ambulatory Visit (INDEPENDENT_AMBULATORY_CARE_PROVIDER_SITE_OTHER): Payer: Managed Care, Other (non HMO) | Admitting: Cardiovascular Disease

## 2015-12-27 ENCOUNTER — Encounter: Payer: Self-pay | Admitting: Cardiovascular Disease

## 2015-12-27 ENCOUNTER — Encounter (INDEPENDENT_AMBULATORY_CARE_PROVIDER_SITE_OTHER): Payer: Self-pay | Admitting: Internal Medicine

## 2015-12-27 ENCOUNTER — Ambulatory Visit (HOSPITAL_COMMUNITY)
Admission: RE | Admit: 2015-12-27 | Discharge: 2015-12-27 | Disposition: A | Discharge status: Home / Self Care | Payer: Managed Care, Other (non HMO) | Source: Ambulatory Visit | Attending: Cardiovascular Disease | Admitting: Cardiovascular Disease

## 2015-12-27 VITALS — BP 126/58 | HR 105 | Ht 65.0 in | Wt 158.0 lb

## 2015-12-27 DIAGNOSIS — R002 Palpitations: Secondary | ICD-10-CM

## 2015-12-27 DIAGNOSIS — R011 Cardiac murmur, unspecified: Secondary | ICD-10-CM

## 2015-12-27 DIAGNOSIS — I491 Atrial premature depolarization: Secondary | ICD-10-CM | POA: Insufficient documentation

## 2015-12-27 DIAGNOSIS — R000 Tachycardia, unspecified: Secondary | ICD-10-CM | POA: Diagnosis not present

## 2015-12-27 DIAGNOSIS — I493 Ventricular premature depolarization: Secondary | ICD-10-CM | POA: Diagnosis not present

## 2015-12-27 NOTE — Patient Instructions (Signed)
Your physician recommends that you schedule a follow-up appointment in: 4-6 Weeks with Jory Sims, NP  Your physician recommends that you continue on your current medications as directed. Please refer to the Current Medication list given to you today.  Your physician has recommended that you wear a 48 Hour holter monitor. Holter monitors are medical devices that record the heart's electrical activity. Doctors most often use these monitors to diagnose arrhythmias. Arrhythmias are problems with the speed or rhythm of the heartbeat. The monitor is a small, portable device. You can wear one while you do your normal daily activities. This is usually used to diagnose what is causing palpitations/syncope (passing out).   Your physician has requested that you have an echocardiogram. Echocardiography is a painless test that uses sound waves to create images of your heart. It provides your doctor with information about the size and shape of your heart and how well your heart's chambers and valves are working. This procedure takes approximately one hour. There are no restrictions for this procedure.  If you need a refill on your cardiac medications before your next appointment, please call your pharmacy.  Thank you for choosing [facility]!

## 2015-12-28 DIAGNOSIS — N10 Acute tubulo-interstitial nephritis: Secondary | ICD-10-CM | POA: Diagnosis not present

## 2016-01-01 ENCOUNTER — Encounter (HOSPITAL_COMMUNITY): Payer: Self-pay | Admitting: Adult Health

## 2016-01-01 ENCOUNTER — Encounter (HOSPITAL_COMMUNITY): Payer: Managed Care, Other (non HMO) | Attending: Adult Health | Admitting: Adult Health

## 2016-01-01 VITALS — BP 142/76 | HR 77 | Temp 99.6°F | Resp 18 | Ht 65.0 in | Wt 155.6 lb

## 2016-01-01 DIAGNOSIS — K909 Intestinal malabsorption, unspecified: Secondary | ICD-10-CM

## 2016-01-01 DIAGNOSIS — R630 Anorexia: Secondary | ICD-10-CM | POA: Diagnosis not present

## 2016-01-01 DIAGNOSIS — D500 Iron deficiency anemia secondary to blood loss (chronic): Secondary | ICD-10-CM | POA: Diagnosis not present

## 2016-01-01 DIAGNOSIS — N920 Excessive and frequent menstruation with regular cycle: Secondary | ICD-10-CM | POA: Diagnosis not present

## 2016-01-01 DIAGNOSIS — E538 Deficiency of other specified B group vitamins: Secondary | ICD-10-CM

## 2016-01-01 DIAGNOSIS — Z9884 Bariatric surgery status: Secondary | ICD-10-CM | POA: Diagnosis not present

## 2016-01-01 DIAGNOSIS — F411 Generalized anxiety disorder: Secondary | ICD-10-CM | POA: Diagnosis not present

## 2016-01-01 DIAGNOSIS — D509 Iron deficiency anemia, unspecified: Secondary | ICD-10-CM

## 2016-01-01 DIAGNOSIS — F3112 Bipolar disorder, current episode manic without psychotic features, moderate: Secondary | ICD-10-CM | POA: Diagnosis not present

## 2016-01-01 MED ORDER — SODIUM CHLORIDE 0.9 % IV SOLN: 510.0000 mg | Freq: Once | INTRAVENOUS | Status: DC

## 2016-01-01 MED ORDER — B-12 1000 MCG SL SUBL: 1000.0000 ug | SUBLINGUAL_TABLET | Freq: Every day | SUBLINGUAL | 6 refills | Status: DC

## 2016-01-01 NOTE — Progress Notes (Signed)
Katelyn [name], [name]   CLINIC:  Medical Oncology  REASON FOR CONSULTATION:  Iron-deficiency anemia   REFERRAL FROM:  Dr. [name] [phone]  PRIMARY CARE PROVIDER: Dr. [name] [phone]   HISTORY OF PRESENT ILLNESS: [name] is referred to the cancer center for management of iron-deficiency anemia.  Her diagnosis of anemia dates back to 01/2014, which was about 15 months after she underwent Roux-en-Y gastric bypass surgery.  Her labs were monitored over time until about 1 year ago when she was started on oral iron supplementation at 325 mg twice daily; she has been on this iron regimen since that time.  Her energy levels have been progressively getting worse over the past year.   In the past 2 months, she has noted worsening cold intolerance, hair loss, and easy bruising.  She denies any frank bleeding, blood in her stools, or dark/tarry stools.  She tells me that her stools were hemoccult negative back at the end of 11/2015.  Her menstrual cycles occur once per month with no metrorrhagia.  Also for the past 2 months, she has had daily fevers of about 100.4; highest fever was 105.  She was diagnosed with a few UTIs and is on daily prophylactic doses of antibiotics now.  She feels tachycardic when she has fevers. She denies any nausea or vomiting; she has some diarrhea, but feels this may be dumping syndrome related to her gastric bypass surgery.  Her PCP is closely monitoring her for this.  She does tell me that she is also recovering from anorexia nervosa since Fall 2016; her weight has been stable since that time; her appetite is good; she has close follow-up with her psychiatrist Dr. Adele Schilder.    ADDITIONAL REVIEW OF SYSTEMS: Review of Systems  Constitutional: Positive for fever and malaise/fatigue.  HENT: Negative.  Negative for nosebleeds.   Eyes: Negative.   Respiratory: Negative.   Cardiovascular:   Tachycardia  Gastrointestinal: Negative.  Negative for blood in stool, melena, nausea and vomiting.  Genitourinary:       Recent UTIs  Musculoskeletal: Negative.   Skin: Negative.   Neurological: Negative.   Endo/Heme/Allergies: Bruises/bleeds easily.       Cold intolerance  Psychiatric/Behavioral:       History of anxiety & depression    PAST MEDICAL & SURGICAL HISTORY:  Past Medical History:  Diagnosis Date  . Anxiety   . Benign juvenile melanoma   . Chronic headaches   . Colon polyps    found on colonoscopy 04/26/2012  . Complication of anesthesia    itching after epidural for c section  . Constipation   . Depression   . Depression    several suicide attempts, hospitaluzed in 2012 for this; hx pf ECT treatments ; pt sees Dr. Adele Schilder pyschiatrist  and doing well on medication  . Headache   . Heart murmur    as a child - no one mentions hearing murmur anymore  . Heartburn    no meds  . History of pneumonia    x 3  years ago - no recent problems  . Obesity   . Pneumonia   . Polycystic ovary    takes metformin to treat  . Skin lesion    Excisional biopsy of moles - none cancerous  . Sleep apnea    yrs ago - diagnosed mild sleep apnea - did not have to use cpap    Past Surgical History:  Procedure Laterality Date  . BREATH TEK H PYLORI N/A 07/22/2013   Procedure: BREATH TEK H PYLORI;  Surgeon: Edward Jolly, MD;  Location: [facility];  Service: General;  Laterality: N/A;  . c sections  08/28/14   x 2  . CESAREAN SECTION  2004, 2007   x 2  . CYSTOSCOPY WITH URETHRAL DILATATION  age 59   . GASTRIC ROUX-EN-Y N/A 10/31/2013   Procedure: LAPAROSCOPIC ROUX-EN-Y GASTRIC BYPASS WITH UPPER ENDOSCOPY;  Surgeon: Edward Jolly, MD;  Location: [facility];  Service: General;  Laterality: N/A;  . kidney stent  08/28/14  . mole excision     "benign juvenile melanoma" removed from left leg - inner thigh  . ROUX-EN-Y PROCEDURE  08/28/14  . SKIN LESION EXCISION     back  .  TONSILLECTOMY    . TONSILLECTOMY  age 67     SOCIAL HISTORY:  [name] is separated and lives with her 2 children in [place], [place].  She has 1 son age 14 and 78 daughter age 93.  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  She denies any history or current use of tobacco, alcohol, or illicit drugs.   CURRENT MEDICATIONS:  Current Outpatient Prescriptions on File Prior to Visit  Medication Sig Dispense Refill  . clonazePAM (KLONOPIN) 0.5 MG tablet TAKE 1 TABLET BY MOUTH 3 TIMES DAILY. (Patient taking differently: Take 0.5 mg by mouth 3 (three) times daily as needed for anxiety. ) 90 tablet 0  . dicyclomine (BENTYL) 10 MG capsule Take 1 capsule (10 mg total) by mouth 4 (four) times daily -  before meals and at bedtime. 120 capsule 2  . FLUoxetine (PROZAC) 20 MG capsule Take 3 capsule daily (Patient taking differently: Take 40 mg by mouth daily. ) 270 capsule 0  . HYDROcodone-acetaminophen (NORCO/VICODIN) 5-325 MG tablet Take 1 tablet by mouth every 6 (six) hours as needed. 6 tablet 0  . Magnesium 400 MG CAPS Take 1 tablet by mouth daily.     . Multiple Vitamin (MULTIVITAMIN WITH MINERALS) TABS tablet Take 1 tablet by mouth 2 (two) times daily.     . ondansetron (ZOFRAN ODT) 4 MG disintegrating tablet Take 1 tablet (4 mg total) by mouth every 8 (eight) hours as needed for nausea or vomiting. 20 tablet 0  . topiramate (TOPAMAX) 100 MG tablet Take 1 tablet (100 mg total) by mouth 2 (two) times daily. 360 tablet 3  . ziprasidone (GEODON) 60 MG capsule Take 1 capsule (60 mg total) by mouth at bedtime. 90 capsule 0   No current facility-administered medications on file prior to visit.      ALLERGIES: Allergies  Allergen Reactions  . Penicillins     Has patient had a PCN reaction causing immediate rash, facial/tongue/throat swelling, SOB or lightheadedness with hypotension: Yes Has patient had a PCN reaction causing severe  rash involving mucus membranes or skin necrosis: No Has patient had a PCN reaction that required hospitalization No Has patient had a PCN reaction occurring within the last 10 years: No If all of the above answers are "NO", then may proceed with Cephalosporin use.     REACTION: Rash     PERFORMANCE STATUS: 80   PHYSICAL EXAM:  Vitals:   01/01/16 1300  BP: (!) 142/76  Pulse: 77  Resp: 18  Temp: 99.6 F (37.6 C)   Filed Weights   01/01/16 1300  Weight:  155 lb 9.6 oz (70.6 kg)    General: Female in no acute distress.  Unaccompanied today.    HEENT: Head is normocephalic.  Pupils equal and reactive to light. Conjunctivae clear without exudate.  Sclerae anicteric. Oral mucosa is pink and moist without lesions. Oropharynx is pink and moist without lesions. Lymph: No cervical, supraclavicular, or infraclavicular lymphadenopathy noted on palpation.   Cardiovascular: Tachycardia, but regular rhythm Respiratory: Clear to auscultation bilaterally. Chest expansion symmetric without accessory muscle use. Breathing non-labored.    GU: Deferred.   GI: Soft, non-tender abdomen. Normoactive bowel sounds.  Neuro: No focal deficits. Steady gait.   Psych: Normal mood and affect for situation. Extremities: No edema. Skin: Pale; skin warm and dry.    LABORATORY DATA: CBC    Component Value Date/Time   WBC 8.9 12/18/2015 2030   RBC 3.55 (L) 12/18/2015 2030   HGB 10.1 (L) 12/18/2015 2030   HCT 30.8 (L) 12/18/2015 2030   PLT 354 12/18/2015 2030   MCV 86.8 12/18/2015 2030   MCH 28.5 12/18/2015 2030   MCHC 32.8 12/18/2015 2030   RDW 15.3 12/18/2015 2030   LYMPHSABS 2.5 12/18/2015 2030   MONOABS 0.9 12/18/2015 2030   EOSABS 0.1 12/18/2015 2030   BASOSABS 0.0 12/18/2015 2030   CMP Latest Ref Rng & Units 12/18/2015 11/07/2015 12/27/2014  Glucose 65 - 99 mg/dL 77 126(H) 25(LL)  BUN 6 - 20 mg/dL 16 8 13   Creatinine 0.44 - 1.00 mg/dL 0.94 0.83 0.78  Sodium 135 - 145 mmol/L 137 137 148(H)    Potassium 3.5 - 5.1 mmol/L 3.5 3.0(L) 3.9  Chloride 101 - 111 mmol/L 109 107 114(H)  CO2 22 - 32 mmol/L 23 21(L) 23  Calcium 8.9 - 10.3 mg/dL 8.4(L) 8.5(L) 8.8  Total Protein 6.5 - 8.1 g/dL 6.8 7.7 6.2  Total Bilirubin 0.3 - 1.2 mg/dL 0.2(L) 0.7 0.3  Alkaline Phos 38 - 126 U/L 70 90 83  AST 15 - 41 U/L 34 19 17  ALT 14 - 54 U/L 28 17 27     Results for SHARMANE, DALTO (MRN [id number]) as of 01/01/2016   Ref. Range 12/19/2015 12:07  Iron Latest Ref Range: 40 - 190 ug/dL 12 (L)  UIBC Latest Ref Range: 125 - 400 ug/dL 430 (H)  TIBC Latest Ref Range: 250 - 450 ug/dL 442  %SAT Latest Ref Range: 11 - 50 % 3 (L)  Ferritin Latest Ref Range: 10 - 232 ng/mL 5 (L)  Folate Latest Ref Range: >5.4 ng/mL >24.0  Vitamin D, 25-Hydroxy Latest Ref Range: 30 - 100 ng/mL 45  Vitamin B12 Latest Ref Range: 200 - 1,100 pg/mL 335   Results for VIANCA, GARDENHIRE (MRN [id number]) as of 01/01/2016   Ref. Range 12/19/2015 12:07  TSH Latest Units: mIU/L 1.17   Results for LAILANI, GARLING (MRN [id number]) as of 01/01/2016   Ref. Range 12/19/2015 12:07  HIV Latest Ref Range: NONREACTIVE  NONREACTIVE   Results for CHANTICE, COLDEN (MRN [id number]) as of 01/01/2016  Ref. Range 12/19/2015 13:27  Fecal Occult Blood, POC Latest Ref Range: Negative  Negative  Card #1 Date Unknown 12/19/2015    DIAGNOSTIC IMAGING:  None for this visit.    ASSESSMENT & PLAN:  [name] is a pleasant 40 y.o. female/female with recent history of iron-deficiency anemia.   1. Iron-deficiency anemia likely secondary to malabsorption: Given Ms. Huffman's history of gastric bypass surgery, she is likely not absorbing the oral supplementation to maintain her iron stores.  Her serum iron on 12/19/15 was quite low at 12 ug/dL.  We discussed that our goal for her total iron will be 100 ug/dL.  We will arrange for her to have IV iron infusion with routine monitoring of her iron lab studies.  She was instructed to stop her oral iron supplementation.   Her B12 was also recently noted to be on the low-end of normal at 335 pg/mL.  She will begin sublingual B12 supplement, which was e-prescribed to her pharmacy today with instructions on use.  Based on iron deficit calculations  = 590 (see calculation below), one dose of Feraheme (510 mg) has been ordered for her.  We reviewed possible side effects of IV iron and expected length of time for her infusions. She may need an additional dose based on her iron deficit, but we will continue to monitor her closely. [name] voiced understanding and agreed with this plan of care.  She will return to the cancer center next week for her first IV iron infusion, then she will follow-up with Dr. Whitney Muse in 1 month with repeat labs. Supportive therapy treatment plan for Feraheme created today.   The following dose calculation was done to determine iron deficit: [(14 - current Hgb) x 2.145 x wt in kg] 14 - 10.1 x 2.145 x 70.6 = 590.6   [name] was encouraged to ask questions and all questions were answered to her stated satisfaction.  She was also encouraged to contact our office if she has any further questions or concerns prior to her next appointment at the cancer center.    Dispo:  -Start sublingual B12 oral supplementation today.  -Arrange for first IV iron infusion (Feraheme) next week.  -Return to cancer center in 1 month to see Dr. Whitney Muse with labs preceding visit.    -This patient was seen & evaluated by Dr. Ancil Linsey who agrees with the above plan of care.   A total of 35 minutes was spent in face-to-face care of this patient, with greater than 50% of that time spent in counseling and care coordination.    Mike Craze, NP [place]   Patient was seen and examined with Elzie Rings today. This is a shared visit. I agree with her HPI. Physical exam is performed and documented below:   PHYSICAL EXAM:  Vitals:   01/01/16 1300  BP: (!) 142/76  Pulse: 77  Resp: 18   Temp: 99.6 F (37.6 C)   Filed Weights   01/01/16 1300  Weight: 155 lb 9.6 oz (70.6 kg)    General: Female in no acute distress.  Unaccompanied today.    HEENT: Head is normocephalic.  Pupils equal and reactive to light. Conjunctivae clear without exudate.  Sclerae anicteric. Oral mucosa is pink and moist without lesions. Oropharynx is pink and moist without lesions. Lymph: No cervical, supraclavicular, or infraclavicular lymphadenopathy noted on palpation.   Cardiovascular: RRR, no murmurs, S1, S2 audible Respiratory: Clear to auscultation bilaterally. NO wheezing or rhonchi GU: Deferred.   GI: Soft, non-tender abdomen. Normoactive bowel sounds. NO HSM Neuro: No focal deficits. Steady gait.  CN II-XII grossly intact Psych: Normal mood and affect for situation. Extremities: No edema. Skin: Pale; skin warm and dry.   Assessment/Plan Gastric Bypass Iron deficiency Anemia Hx anorexia Iron malabsorption secondary to bypass surgery Iron losses secondary to menses  Agree with IV iron replacement. Calculated iron deficient noted above. The most likely cause of her anemia is due to chronic blood loss/malabsorption syndrome.  We discussed some of the risks, benefits, and alternatives of intravenous iron infusions. The patient is symptomatic from anemia and the iron level is critically low. Some of the side-effects to be expected including risks of infusion reactions, phlebitis, headaches, nausea and fatigue.  The patient is willing to proceed. Patient education material was dispensed. Goal is to keep ferritin level greater than or equal to 100 ng/ml  Donald Pore MD

## 2016-01-01 NOTE — Patient Instructions (Signed)
We will get you scheduled for your first IV iron infusion within the next week or so.  You will then see Dr. Whitney Muse in about 1 month with repeat labs before that visit.    -Start taking the Vitamin B12 medication (under your tongue) once daily.    Call us with any questions!  Mike Craze, NP [place] [phone]

## 2016-01-03 ENCOUNTER — Telehealth: Payer: Self-pay | Admitting: Family Medicine

## 2016-01-03 DIAGNOSIS — N10 Acute tubulo-interstitial nephritis: Secondary | ICD-10-CM | POA: Diagnosis not present

## 2016-01-03 DIAGNOSIS — F509 Eating disorder, unspecified: Secondary | ICD-10-CM

## 2016-01-03 DIAGNOSIS — R000 Tachycardia, unspecified: Secondary | ICD-10-CM

## 2016-01-03 NOTE — Telephone Encounter (Signed)
Dr. Moshe Cipro wanted her to see Dr. Laural Golden but he can't see her until January - wants to know if she can see Dr. Oneida Alar instead. Also she hasn't heard anything from an infectious disease doctor Serena Colonel will you check into the referral?)

## 2016-01-03 NOTE — Telephone Encounter (Signed)
Referral changed to dr fields

## 2016-01-07 ENCOUNTER — Other Ambulatory Visit (HOSPITAL_COMMUNITY): Payer: Self-pay | Admitting: Oncology

## 2016-01-07 ENCOUNTER — Encounter (HOSPITAL_BASED_OUTPATIENT_CLINIC_OR_DEPARTMENT_OTHER): Payer: Managed Care, Other (non HMO)

## 2016-01-07 VITALS — BP 116/67 | HR 64 | Temp 98.5°F | Resp 18

## 2016-01-07 DIAGNOSIS — D509 Iron deficiency anemia, unspecified: Secondary | ICD-10-CM | POA: Diagnosis not present

## 2016-01-07 MED ORDER — METHYLPREDNISOLONE SODIUM SUCC 40 MG IJ SOLR
INTRAMUSCULAR | Status: AC
  Filled 2016-01-07: qty 1

## 2016-01-07 MED ORDER — SODIUM CHLORIDE 0.9 % IV SOLN
Freq: Once | INTRAVENOUS | Status: AC
  Administered 2016-01-07: 15:00:00 via INTRAVENOUS

## 2016-01-07 MED ORDER — METHYLPREDNISOLONE SODIUM SUCC 40 MG IJ SOLR
40.0000 mg | Freq: Once | INTRAMUSCULAR | Status: AC
  Administered 2016-01-07: 40 mg via INTRAVENOUS

## 2016-01-07 MED ORDER — ACETAMINOPHEN 325 MG PO TABS
650.0000 mg | ORAL_TABLET | Freq: Once | ORAL | Status: AC
  Administered 2016-01-07: 650 mg via ORAL

## 2016-01-07 MED ORDER — DIPHENHYDRAMINE HCL 50 MG/ML IJ SOLN
25.0000 mg | Freq: Once | INTRAMUSCULAR | Status: AC
Start: 2016-01-07 — End: 2016-01-07
  Administered 2016-01-07: 25 mg via INTRAVENOUS

## 2016-01-07 MED ORDER — ACETAMINOPHEN 325 MG PO TABS
ORAL_TABLET | ORAL | Status: AC
  Filled 2016-01-07: qty 2

## 2016-01-07 MED ORDER — METHYLPREDNISOLONE SODIUM SUCC 125 MG IJ SOLR
INTRAMUSCULAR | Status: AC
  Filled 2016-01-07: qty 2

## 2016-01-07 MED ORDER — SODIUM CHLORIDE 0.9 % IV SOLN
510.0000 mg | Freq: Once | INTRAVENOUS | Status: AC
  Administered 2016-01-07: 510 mg via INTRAVENOUS
  Filled 2016-01-07: qty 17

## 2016-01-07 MED ORDER — DIPHENHYDRAMINE HCL 50 MG/ML IJ SOLN
INTRAMUSCULAR | Status: AC
  Filled 2016-01-07: qty 1

## 2016-01-07 NOTE — Patient Instructions (Signed)
[place] at [facility] Discharge Instructions  RECOMMENDATIONS MADE BY THE CONSULTANT AND ANY TEST RESULTS WILL BE SENT TO YOUR REFERRING PHYSICIAN.  Almost complete dose of Feraheme today until reaction occurred.   Solu-Medrol 40mg  IV, Benadryl 25mg  IV, and Tylenol 650mg  by mouth for allergic reaction.    Thank you for choosing [place] at [facility] to provide your oncology and hematology care.  To afford each patient quality time with our provider, please arrive at least 15 minutes before your scheduled appointment time.   Beginning January 23rd 2017 lab work for the [facility] will be done in the  Main lab at [facility] on 1st floor. If you have a lab appointment with the [facility] please come in thru the  Main Entrance and check in at the main information desk  You need to re-schedule your appointment should you arrive 10 or more minutes late.  We strive to give you quality time with our providers, and arriving late affects you and other patients whose appointments are after yours.  Also, if you no show three or more times for appointments you may be dismissed from the clinic at the providers discretion.     Again, thank you for choosing [facility].  Our hope is that these requests will decrease the amount of time that you wait before being seen by our physicians.       _____________________________________________________________  Should you have questions after your visit to [facility], please contact our office at [phone] between the hours of 8:30 a.m. and 4:30 p.m.  Voicemails left after 4:30 p.m. will not be returned until the following business day.  For prescription refill requests, have your pharmacy contact our office.         Resources For Cancer Patients and their Caregivers ? American Cancer Society: Can assist with transportation, wigs, general needs, runs Look Good Feel Better.         970-710-3173 ? Cancer Care: Provides financial assistance, online support groups, medication/co-pay assistance.  1-800-813-HOPE 610-593-6468) ? [facility] Assists [place] cancer patients and their families through emotional , educational and financial support.  (602) 231-6124 ? [place] DSS Where to apply for food stamps, Medicaid and utility assistance. 640-381-0046 ? RCATS: Transportation to medical appointments. 762-792-6423 ? Social Security Administration: May apply for disability if have a Stage IV cancer. 407-089-7222 (541)242-2832 ? [employer], Disability and Transit Services: Assists with nutrition, care and transit needs. [place] Support Programs: @10RELATIVEDAYS @ > Cancer Support Group  2nd Tuesday of the month 1pm-2pm, Journey Room  > Creative Journey  3rd Tuesday of the month 1130am-1pm, Journey Room  > Look Good Feel Better  1st Wednesday of the month 10am-12 noon, Journey Room (Call [place] to register [phone])

## 2016-01-07 NOTE — Progress Notes (Signed)
1525: Feraheme stopped r/t patient developing hives locally to the IV site.  VSS.  Robynn Pane, PA aware and in room.  No other complaints or symptoms.  Patient given benadryl IV, Solu-medrol IV, and tylenol PO as ordered for reaction.   Patient monitored for additional 45 minutes without further s/s allergic reaction.  Ambulatory and stable upon discharge.

## 2016-01-08 MED ORDER — HEPARIN SOD (PORK) LOCK FLUSH 100 UNIT/ML IV SOLN
INTRAVENOUS | Status: AC
  Filled 2016-01-08: qty 5

## 2016-01-09 ENCOUNTER — Ambulatory Visit (INDEPENDENT_AMBULATORY_CARE_PROVIDER_SITE_OTHER): Payer: Self-pay | Admitting: Internal Medicine

## 2016-01-09 ENCOUNTER — Other Ambulatory Visit (HOSPITAL_COMMUNITY): Payer: Self-pay

## 2016-01-09 DIAGNOSIS — B373 Candidiasis of vulva and vagina: Secondary | ICD-10-CM | POA: Diagnosis not present

## 2016-01-09 DIAGNOSIS — Z3009 Encounter for other general counseling and advice on contraception: Secondary | ICD-10-CM | POA: Diagnosis not present

## 2016-01-09 DIAGNOSIS — R102 Pelvic and perineal pain: Secondary | ICD-10-CM | POA: Diagnosis not present

## 2016-01-09 DIAGNOSIS — Z30432 Encounter for removal of intrauterine contraceptive device: Secondary | ICD-10-CM | POA: Diagnosis not present

## 2016-01-10 DIAGNOSIS — F3112 Bipolar disorder, current episode manic without psychotic features, moderate: Secondary | ICD-10-CM | POA: Diagnosis not present

## 2016-01-10 DIAGNOSIS — F411 Generalized anxiety disorder: Secondary | ICD-10-CM | POA: Diagnosis not present

## 2016-01-10 DIAGNOSIS — R630 Anorexia: Secondary | ICD-10-CM | POA: Diagnosis not present

## 2016-01-15 ENCOUNTER — Other Ambulatory Visit (HOSPITAL_COMMUNITY): Payer: Self-pay | Admitting: Psychiatry

## 2016-01-15 ENCOUNTER — Other Ambulatory Visit (HOSPITAL_COMMUNITY): Payer: Self-pay | Admitting: Pharmacist

## 2016-01-15 DIAGNOSIS — F331 Major depressive disorder, recurrent, moderate: Secondary | ICD-10-CM

## 2016-01-16 ENCOUNTER — Encounter (HOSPITAL_BASED_OUTPATIENT_CLINIC_OR_DEPARTMENT_OTHER): Payer: Managed Care, Other (non HMO)

## 2016-01-16 VITALS — BP 100/65 | HR 76 | Temp 98.7°F | Resp 18 | Wt 157.4 lb

## 2016-01-16 DIAGNOSIS — D509 Iron deficiency anemia, unspecified: Secondary | ICD-10-CM | POA: Diagnosis not present

## 2016-01-16 MED ORDER — SODIUM CHLORIDE 0.9 % IV SOLN
INTRAVENOUS | Status: DC
  Administered 2016-01-16: 14:00:00 via INTRAVENOUS

## 2016-01-16 MED ORDER — SODIUM CHLORIDE 0.9 % IV SOLN
750.0000 mg | Freq: Once | INTRAVENOUS | Status: AC
  Administered 2016-01-16: 750 mg via INTRAVENOUS
  Filled 2016-01-16: qty 15

## 2016-01-16 NOTE — Progress Notes (Signed)
Injectafer given today per orders. Patient tolerated well. Vitals stable. Patient discharged from clinic ambulatory with family at side.

## 2016-01-16 NOTE — Patient Instructions (Signed)
[place] at [facility] Discharge Instructions  RECOMMENDATIONS MADE BY THE CONSULTANT AND ANY TEST RESULTS WILL BE SENT TO YOUR REFERRING PHYSICIAN.  Injectafer given today. Follow up as scheduled.  Thank you for choosing [place] at [facility] to provide your oncology and hematology care.  To afford each patient quality time with our provider, please arrive at least 15 minutes before your scheduled appointment time.   Beginning January 23rd 2017 lab work for the [facility] will be done in the  Main lab at [facility] on 1st floor. If you have a lab appointment with the [facility] please come in thru the  Main Entrance and check in at the main information desk  You need to re-schedule your appointment should you arrive 10 or more minutes late.  We strive to give you quality time with our providers, and arriving late affects you and other patients whose appointments are after yours.  Also, if you no show three or more times for appointments you may be dismissed from the clinic at the providers discretion.     Again, thank you for choosing [facility].  Our hope is that these requests will decrease the amount of time that you wait before being seen by our physicians.       _____________________________________________________________  Should you have questions after your visit to [facility], please contact our office at [phone] between the hours of 8:30 a.m. and 4:30 p.m.  Voicemails left after 4:30 p.m. will not be returned until the following business day.  For prescription refill requests, have your pharmacy contact our office.         Resources For Cancer Patients and their Caregivers ? American Cancer Society: Can assist with transportation, wigs, general needs, runs Look Good Feel Better.        (223)589-6131 ? Cancer Care: Provides financial assistance, online support groups,  medication/co-pay assistance.  1-800-813-HOPE 548-481-6796) ? [place] Assists [place] cancer patients and their families through emotional , educational and financial support.  (801)841-5284 ? [place] DSS Where to apply for food stamps, Medicaid and utility assistance. 551-040-1350 ? RCATS: Transportation to medical appointments. 938-357-1985 ? Social Security Administration: May apply for disability if have a Stage IV cancer. 769-552-5033 (364) 415-4519 ? [employer], Disability and Transit Services: Assists with nutrition, care and transit needs. [place] Support Programs: @10RELATIVEDAYS @ > Cancer Support Group  2nd Tuesday of the month 1pm-2pm, Journey Room  > Creative Journey  3rd Tuesday of the month 1130am-1pm, Journey Room  > Look Good Feel Better  1st Wednesday of the month 10am-12 noon, Journey Room (Call [place] to register [phone])

## 2016-01-17 ENCOUNTER — Other Ambulatory Visit (HOSPITAL_COMMUNITY): Payer: Self-pay

## 2016-01-17 DIAGNOSIS — F331 Major depressive disorder, recurrent, moderate: Secondary | ICD-10-CM

## 2016-01-17 MED ORDER — ZIPRASIDONE HCL 60 MG PO CAPS: 60.0000 mg | ORAL_CAPSULE | Freq: Every day | ORAL | 0 refills | Status: DC

## 2016-01-17 MED ORDER — FLUOXETINE HCL 20 MG PO CAPS: 60.0000 mg | ORAL_CAPSULE | Freq: Every day | ORAL | 0 refills | Status: DC

## 2016-01-18 DIAGNOSIS — F411 Generalized anxiety disorder: Secondary | ICD-10-CM | POA: Diagnosis not present

## 2016-01-18 DIAGNOSIS — R630 Anorexia: Secondary | ICD-10-CM | POA: Diagnosis not present

## 2016-01-18 DIAGNOSIS — F3112 Bipolar disorder, current episode manic without psychotic features, moderate: Secondary | ICD-10-CM | POA: Diagnosis not present

## 2016-01-25 ENCOUNTER — Ambulatory Visit (HOSPITAL_COMMUNITY): Payer: Self-pay | Admitting: Psychiatry

## 2016-01-25 DIAGNOSIS — F411 Generalized anxiety disorder: Secondary | ICD-10-CM | POA: Diagnosis not present

## 2016-01-25 DIAGNOSIS — F3112 Bipolar disorder, current episode manic without psychotic features, moderate: Secondary | ICD-10-CM | POA: Diagnosis not present

## 2016-01-25 DIAGNOSIS — R630 Anorexia: Secondary | ICD-10-CM | POA: Diagnosis not present

## 2016-01-28 ENCOUNTER — Ambulatory Visit (HOSPITAL_COMMUNITY): Payer: Self-pay | Admitting: Psychiatry

## 2016-01-31 ENCOUNTER — Ambulatory Visit: Payer: Self-pay | Admitting: Adult Health

## 2016-01-31 ENCOUNTER — Ambulatory Visit (INDEPENDENT_AMBULATORY_CARE_PROVIDER_SITE_OTHER): Payer: Managed Care, Other (non HMO)

## 2016-01-31 ENCOUNTER — Ambulatory Visit: Payer: Self-pay | Admitting: Family Medicine

## 2016-01-31 VITALS — BP 122/64 | HR 62 | Temp 99.0°F | Resp 18 | Ht 65.0 in | Wt 163.0 lb

## 2016-01-31 DIAGNOSIS — Z0000 Encounter for general adult medical examination without abnormal findings: Secondary | ICD-10-CM

## 2016-01-31 DIAGNOSIS — Z23 Encounter for immunization: Secondary | ICD-10-CM | POA: Diagnosis not present

## 2016-01-31 NOTE — Patient Instructions (Signed)
Thank you for choosing [facility] for your health care needs  The Annual Wellness Visit is designed to allow [place] the chance to assist you in preserving and improving you health.   I will be in touch with you in regard to the referral and the next step with the chronic fever  If you have any concerns please don't hesitate to call.  The new # is [phone]

## 2016-02-01 ENCOUNTER — Encounter (HOSPITAL_COMMUNITY): Payer: Managed Care, Other (non HMO)

## 2016-02-01 ENCOUNTER — Encounter (HOSPITAL_COMMUNITY): Payer: Managed Care, Other (non HMO) | Attending: Adult Health | Admitting: Hematology & Oncology

## 2016-02-01 ENCOUNTER — Encounter (HOSPITAL_COMMUNITY): Payer: Self-pay | Admitting: Hematology & Oncology

## 2016-02-01 VITALS — BP 119/64 | HR 69 | Temp 99.7°F | Resp 18 | Wt 163.2 lb

## 2016-02-01 DIAGNOSIS — E538 Deficiency of other specified B group vitamins: Secondary | ICD-10-CM | POA: Diagnosis not present

## 2016-02-01 DIAGNOSIS — Z9884 Bariatric surgery status: Secondary | ICD-10-CM | POA: Diagnosis not present

## 2016-02-01 DIAGNOSIS — F411 Generalized anxiety disorder: Secondary | ICD-10-CM | POA: Diagnosis not present

## 2016-02-01 DIAGNOSIS — K909 Intestinal malabsorption, unspecified: Secondary | ICD-10-CM

## 2016-02-01 DIAGNOSIS — D500 Iron deficiency anemia secondary to blood loss (chronic): Secondary | ICD-10-CM | POA: Diagnosis not present

## 2016-02-01 DIAGNOSIS — R630 Anorexia: Secondary | ICD-10-CM | POA: Diagnosis not present

## 2016-02-01 DIAGNOSIS — D509 Iron deficiency anemia, unspecified: Secondary | ICD-10-CM

## 2016-02-01 DIAGNOSIS — F509 Eating disorder, unspecified: Secondary | ICD-10-CM

## 2016-02-01 DIAGNOSIS — N920 Excessive and frequent menstruation with regular cycle: Secondary | ICD-10-CM

## 2016-02-01 DIAGNOSIS — F3112 Bipolar disorder, current episode manic without psychotic features, moderate: Secondary | ICD-10-CM | POA: Diagnosis not present

## 2016-02-01 LAB — CBC WITH DIFFERENTIAL/PLATELET
BASOS ABS: 0 10*3/uL (ref 0.0–0.1)
BASOS PCT: 0 %
EOS ABS: 0.1 10*3/uL (ref 0.0–0.7)
EOS PCT: 1 %
HCT: 39.6 % (ref 36.0–46.0)
Hemoglobin: 12.8 g/dL (ref 12.0–15.0)
Lymphocytes Relative: 24 %
Lymphs Abs: 1.8 10*3/uL (ref 0.7–4.0)
MCH: 30.6 pg (ref 26.0–34.0)
MCHC: 32.3 g/dL (ref 30.0–36.0)
MCV: 94.7 fL (ref 78.0–100.0)
Monocytes Absolute: 0.5 10*3/uL (ref 0.1–1.0)
Monocytes Relative: 7 %
Neutro Abs: 5.1 10*3/uL (ref 1.7–7.7)
Neutrophils Relative %: 68 %
PLATELETS: 364 10*3/uL (ref 150–400)
RBC: 4.18 MIL/uL (ref 3.87–5.11)
RDW: 21.3 % — ABNORMAL HIGH (ref 11.5–15.5)
WBC: 7.5 10*3/uL (ref 4.0–10.5)

## 2016-02-01 LAB — VITAMIN B12: VITAMIN B 12: 305 pg/mL (ref 180–914)

## 2016-02-01 NOTE — Progress Notes (Signed)
[facility] [place], [place] [address]   CLINIC:  Medical Oncology  REASON FOR CONSULTATION:  Iron-deficiency anemia   REFERRAL FROM:  Dr. [name] [phone]  PRIMARY CARE PROVIDER: Dr. [name] [phone]   HISTORY OF PRESENT ILLNESS: [name] returns to the cancer center for management of iron-deficiency anemia.  Her diagnosis of anemia dates back to 01/2014, which was about 15 months after she underwent Roux-en-Y gastric bypass surgery.  Her labs were monitored over time until about 1 year ago when she was started on oral iron supplementation at 325 mg twice daily; she has been on this iron regimen since that time.  Her energy levels have been progressively getting worse over the past year. She has now received IV iron,  Given on 9/20.  She had a reaction to feraheme.   [name] is unaccompanied. I personally reviewed and went over laboratory studies with the patient.  She takes sublingual B12 daily.   She has been feeling better and has noticed she has had more energy. She remarks that her fatigue is improved.   She reports she developed headaches soon after receiving IV iron. She already has chronic headaches/migraines and has noticed an increase since the IV iron. No visual changes, speech changes or other problems. She wonders if she is getting used to "not being anemic."  She denies pagophagia, pica.    ADDITIONAL REVIEW OF SYSTEMS: Review of Systems  HENT: Negative for nosebleeds.        Chronic headaches increased after IV iron  Eyes: Negative.   Respiratory: Negative.   Cardiovascular:       Tachycardia  Gastrointestinal: Negative.  Negative for blood in stool, melena, nausea and vomiting.  Musculoskeletal: Negative.   Skin: Negative.   Neurological: Positive for headaches.  Endo/Heme/Allergies: Bruises/bleeds easily.       Cold intolerance  Psychiatric/Behavioral:       History of anxiety & depression   14 point review of systems was performed and is negative except as detailed under history of present illness and above   PAST MEDICAL & SURGICAL HISTORY:  Past Medical History:  Diagnosis Date  . Anxiety   . Benign juvenile melanoma   . Chronic headaches   . Colon polyps    found on colonoscopy 04/26/2012  . Complication of anesthesia    itching after epidural for c section  . Constipation   . Depression   . Depression    several suicide attempts, hospitaluzed in 2012 for this; hx pf ECT treatments ; pt sees Dr. Adele Schilder pyschiatrist  and doing well on medication  . Headache   . Heart murmur    as a child - no one mentions hearing murmur anymore  . Heartburn    no meds  . History of pneumonia    x 3  years ago - no recent problems  . Obesity   . Pneumonia   . Polycystic ovary    takes metformin to treat  . Skin lesion    Excisional biopsy of moles - none cancerous  . Sleep apnea    yrs ago - diagnosed mild sleep apnea - did not have to use cpap    Past Surgical History:  Procedure Laterality Date  . BREATH TEK H PYLORI N/A 07/22/2013   Procedure: BREATH TEK H PYLORI;  Surgeon: Edward Jolly, MD;  Location: [facility];  Service: General;  Laterality: N/A;  . c sections  08/28/14  x 2  . CESAREAN SECTION  2004, 2007   x 2  . CYSTOSCOPY WITH URETHRAL DILATATION  age 7   . GASTRIC ROUX-EN-Y N/A 10/31/2013   Procedure: LAPAROSCOPIC ROUX-EN-Y GASTRIC BYPASS WITH UPPER ENDOSCOPY;  Surgeon: Edward Jolly, MD;  Location: [facility];  Service: General;  Laterality: N/A;  . kidney stent  08/28/14  . mole excision     "benign juvenile melanoma" removed from left leg - inner thigh  . ROUX-EN-Y PROCEDURE  08/28/14  . SKIN LESION EXCISION     back  . TONSILLECTOMY    . TONSILLECTOMY  age 22     SOCIAL HISTORY:  [name] is separated and lives with her 2 children in [place], [place].  She has 1 son age 68 and 42 daughter age 83.  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  She denies any history or current use of tobacco, alcohol, or illicit drugs.   CURRENT MEDICATIONS:  Current Outpatient Prescriptions on File Prior to Visit  Medication Sig Dispense Refill  . clonazePAM (KLONOPIN) 0.5 MG tablet TAKE 1 TABLET BY MOUTH 3 TIMES DAILY. (Patient taking differently: Take 0.5 mg by mouth 3 (three) times daily as needed for anxiety. ) 90 tablet 0  . Cyanocobalamin (B-12) 1000 MCG SUBL Place 1,000 mcg under the tongue daily. 30 each 6  . dicyclomine (BENTYL) 10 MG capsule Take 1 capsule (10 mg total) by mouth 4 (four) times daily -  before meals and at bedtime. 120 capsule 2  . FLUoxetine (PROZAC) 20 MG capsule Take 3 capsules (60 mg total) by mouth daily. 90 capsule 0  . HYDROcodone-acetaminophen (NORCO/VICODIN) 5-325 MG tablet Take 1 tablet by mouth every 6 (six) hours as needed. 6 tablet 0  . Magnesium 400 MG CAPS Take 1 tablet by mouth daily.     . Multiple Vitamin (MULTIVITAMIN WITH MINERALS) TABS tablet Take 1 tablet by mouth 2 (two) times daily.     . ondansetron (ZOFRAN ODT) 4 MG disintegrating tablet Take 1 tablet (4 mg total) by mouth every 8 (eight) hours as needed for nausea or vomiting. 20 tablet 0  . topiramate (TOPAMAX) 100 MG tablet Take 1 tablet (100 mg total) by mouth 2 (two) times daily. 360 tablet 3  . ziprasidone (GEODON) 60 MG capsule Take 1 capsule (60 mg total) by mouth at bedtime. 30 capsule 0   No current facility-administered medications on file prior to visit.      ALLERGIES: Allergies  Allergen Reactions  . Feraheme [Ferumoxytol] Hives  . Penicillins     Has patient had a PCN reaction causing immediate rash, facial/tongue/throat swelling, SOB or lightheadedness with hypotension: Yes Has patient had a PCN reaction causing severe rash involving mucus membranes or skin necrosis: No Has patient had a PCN reaction that required hospitalization No Has patient had a PCN  reaction occurring within the last 10 years: No If all of the above answers are "NO", then may proceed with Cephalosporin use.     REACTION: Rash     PERFORMANCE STATUS: 80    PHYSICAL EXAM:  Vitals:   02/01/16 1327  BP: 119/64  Pulse: 69  Resp: 18  Temp: 99.7 F (37.6 C)   Filed Weights   02/01/16 1327  Weight: 163 lb 3.2 oz (74 kg)    Physical Exam  Constitutional: She is oriented to person, place, and time and well-developed, well-nourished, and  in no distress.  HENT:  Head: Normocephalic and atraumatic.  Nose: Nose normal.  Mouth/Throat: Oropharynx is clear and moist. No oropharyngeal exudate.  Eyes: Conjunctivae and EOM are normal. Pupils are equal, round, and reactive to light. Right eye exhibits no discharge. Left eye exhibits no discharge. No scleral icterus.  Neck: Normal range of motion. Neck supple. No tracheal deviation present. No thyromegaly present.  Cardiovascular: Normal rate, regular rhythm and normal heart sounds.  Exam reveals no gallop and no friction rub.   No murmur heard. Pulmonary/Chest: Effort normal and breath sounds normal. She has no wheezes. She has no rales.  Abdominal: Soft. Bowel sounds are normal. She exhibits no distension and no mass. There is no tenderness. There is no rebound and no guarding.  Musculoskeletal: Normal range of motion. She exhibits no edema.  Lymphadenopathy:    She has no cervical adenopathy.  Neurological: She is alert and oriented to person, place, and time. She has normal reflexes. No cranial nerve deficit. Gait normal. Coordination normal.  Skin: Skin is warm and dry. No rash noted.  Psychiatric: Mood, memory, affect and judgment normal.  Nursing note and vitals reviewed.    LABORATORY DATA: I have reviewed the data as listed. CBC    Component Value Date/Time   WBC 7.5 02/01/2016 1300   RBC 4.18 02/01/2016 1300   HGB 12.8 02/01/2016 1300   HCT 39.6 02/01/2016 1300   PLT 364 02/01/2016 1300   MCV 94.7  02/01/2016 1300   MCH 30.6 02/01/2016 1300   MCHC 32.3 02/01/2016 1300   RDW 21.3 (H) 02/01/2016 1300   LYMPHSABS 1.8 02/01/2016 1300   MONOABS 0.5 02/01/2016 1300   EOSABS 0.1 02/01/2016 1300   BASOSABS 0.0 02/01/2016 1300   CMP Latest Ref Rng & Units 12/18/2015 11/07/2015 12/27/2014  Glucose 65 - 99 mg/dL 77 126(H) 25(LL)  BUN 6 - 20 mg/dL 16 8 13   Creatinine 0.44 - 1.00 mg/dL 0.94 0.83 0.78  Sodium 135 - 145 mmol/L 137 137 148(H)  Potassium 3.5 - 5.1 mmol/L 3.5 3.0(L) 3.9  Chloride 101 - 111 mmol/L 109 107 114(H)  CO2 22 - 32 mmol/L 23 21(L) 23  Calcium 8.9 - 10.3 mg/dL 8.4(L) 8.5(L) 8.8  Total Protein 6.5 - 8.1 g/dL 6.8 7.7 6.2  Total Bilirubin 0.3 - 1.2 mg/dL 0.2(L) 0.7 0.3  Alkaline Phos 38 - 126 U/L 70 90 83  AST 15 - 41 U/L 34 19 17  ALT 14 - 54 U/L 28 17 27     Results for OLEVIA, BUDMAN (MRN [id number])   Ref. Range 12/19/2015 12:07  Iron Latest Ref Range: 40 - 190 ug/dL 12 (L)  UIBC Latest Ref Range: 125 - 400 ug/dL 430 (H)  TIBC Latest Ref Range: 250 - 450 ug/dL 442  %SAT Latest Ref Range: 11 - 50 % 3 (L)  Ferritin Latest Ref Range: 10 - 232 ng/mL 5 (L)  Folate Latest Ref Range: >5.4 ng/mL >24.0  Vitamin D, 25-Hydroxy Latest Ref Range: 30 - 100 ng/mL 45  Vitamin B12 Latest Ref Range: 200 - 1,100 pg/mL 335   Results for ZAYLEEN, KOSAKOWSKI (MRN [id number])   Ref. Range 12/19/2015 12:07  TSH Latest Units: mIU/L 1.17   Results for DEMETRIA, ROLAND (MRN [id number])   Ref. Range 12/19/2015 12:07  HIV Latest Ref Range: NONREACTIVE  NONREACTIVE   Results for TAKEEMA, SPEGAL (MRN [id number])   Ref. Range 12/19/2015 13:27  Fecal Occult Blood, POC Latest Ref Range: Negative  Negative  Card #1 Date Unknown 12/19/2015   Results for KYRIAH, HUIBREGTSE (MRN [id number]) as of 02/09/2016 15:26  Ref. Range 12/18/2015 20:30 02/01/2016 13:00  Hemoglobin Latest Ref Range: 12.0 - 15.0 g/dL 10.1 (L) 12.8   DIAGNOSTIC IMAGING:  None for this visit.   Assessment/Plan Gastric  Bypass Iron deficiency Anemia Hx anorexia Iron malabsorption secondary to bypass surgery Iron losses secondary to menses   Given Ms. Huffman's history of gastric bypass surgery, she is likely not absorbing the oral supplementation to maintain her iron stores.  Her serum iron on 12/19/15 was quite low at 12 ug/dL.  We discussed that our goal for her total iron will be 100 ug/dL.  We will arrange for her to have IV iron infusion with routine monitoring of her iron lab studies.  She was instructed to stop her oral iron supplementation.  Her B12 was also recently noted to be on the low-end of normal at 335 pg/mL.  She will continue on sublingual B12 supplement.  Labs reviewed with the patient and noted in the chart above.  HGB is 12.8 today. Iron level pending, we will call with these results. Additional iron will be given if needed.   We will check B12 again in a few months and will continue to monitor it.   She will continue to receive Injectafer. Shirlean Kelly is now on her allergy list.  She will return for follow up in 4 months with labs.   This document serves as a record of services personally performed by Ancil Linsey, MD. It was created on her behalf by Arlyce Harman, a trained medical scribe. The creation of this record is based on the scribe's personal observations and the provider's statements to them. This document has been checked and approved by the attending provider.  I have reviewed the above documentation for accuracy and completeness and I agree with the above.  Donald Pore MD

## 2016-02-01 NOTE — Patient Instructions (Signed)
[place] at [facility] Discharge Instructions  RECOMMENDATIONS MADE BY THE CONSULTANT AND ANY TEST RESULTS WILL BE SENT TO YOUR REFERRING PHYSICIAN.   Return to clinic in 4 months with labs  Thank you for choosing [place] at [name] to provide your oncology and hematology care.  To afford each patient quality time with our provider, please arrive at least 15 minutes before your scheduled appointment time.   Beginning January 23rd 2017 lab work for the [facility] will be done in the  Main lab at [facility] on 1st floor. If you have a lab appointment with the [facility] please come in thru the  Main Entrance and check in at the main information desk  You need to re-schedule your appointment should you arrive 10 or more minutes late.  We strive to give you quality time with our providers, and arriving late affects you and other patients whose appointments are after yours.  Also, if you no show three or more times for appointments you may be dismissed from the clinic at the providers discretion.     Again, thank you for choosing [facility].  Our hope is that these requests will decrease the amount of time that you wait before being seen by our physicians.       _____________________________________________________________  Should you have questions after your visit to [facility], please contact our office at [phone] between the hours of 8:30 a.m. and 4:30 p.m.  Voicemails left after 4:30 p.m. will not be returned until the following business day.  For prescription refill requests, have your pharmacy contact our office.         Resources For Cancer Patients and their Caregivers ? American Cancer Society: Can assist with transportation, wigs, general needs, runs Look Good Feel Better.        (315) 346-6016 ? Cancer Care: Provides financial assistance, online support groups, medication/co-pay  assistance.  1-800-813-HOPE 249-114-5198) ? [facility] Assists [place] cancer patients and their families through emotional , educational and financial support.  (684)017-5908 ? [place] DSS Where to apply for food stamps, Medicaid and utility assistance. 520-079-5645 ? [facility]: Transportation to medical appointments. 602-049-2158 ? Social Security Administration: May apply for disability if have a Stage IV cancer. (587) 028-8650 619-360-2201 ? [employer], Disability and Transit Services: Assists with nutrition, care and transit needs. [place] Support Programs: @10RELATIVEDAYS @ > Cancer Support Group  2nd Tuesday of the month 1pm-2pm, Journey Room  > Creative Journey  3rd Tuesday of the month 1130am-1pm, Journey Room  > Look Good Feel Better  1st Wednesday of the month 10am-12 noon, Journey Room (Call [place] to register [phone])

## 2016-02-04 ENCOUNTER — Telehealth: Payer: Self-pay

## 2016-02-04 LAB — VITAMIN B6: VITAMIN B6: 8.4 ug/L (ref 2.0–32.8)

## 2016-02-04 NOTE — Telephone Encounter (Signed)
Needs to follow recommendation of ID Doc with tempoerature log and follow through with recommendation in his note

## 2016-02-04 NOTE — Progress Notes (Signed)
Subjective:    Katelyn [name] is a 40 y.o. female who presents for Medicare Annual/Subsequent preventive examination.  Preventive Screening-Counseling & Management  Tobacco History  Smoking Status  . Former Smoker  . Packs/day: 1.00  . Years: 5.00  . Types: Cigarettes  . Quit date: 08/26/1997  Smokeless Tobacco  . Never Used     Current Problems (verified) Patient Active Problem List   Diagnosis Date Noted  . Tachycardia 12/19/2015  . Fever in adult 12/19/2015  . Iron deficiency anemia 06/10/2015  . Sleep disorder 06/01/2013  . Generalized anxiety disorder 05/30/2013  . Chronic headaches 07/01/2012  . Depression, major, recurrent, severe with psychosis ([place]) 06/17/2012  . Family hx colonic polyps 03/03/2012  . Eating disorder 12/18/2011  . POLYCYSTIC OVARIAN DISEASE 09/01/2008    Medications Prior to Visit Current Outpatient Prescriptions on File Prior to Visit  Medication Sig Dispense Refill  . clonazePAM (KLONOPIN) 0.5 MG tablet TAKE 1 TABLET BY MOUTH 3 TIMES DAILY. (Patient taking differently: Take 0.5 mg by mouth 3 (three) times daily as needed for anxiety. ) 90 tablet 0  . Cyanocobalamin (B-12) 1000 MCG SUBL Place 1,000 mcg under the tongue daily. 30 each 6  . dicyclomine (BENTYL) 10 MG capsule Take 1 capsule (10 mg total) by mouth 4 (four) times daily -  before meals and at bedtime. 120 capsule 2  . FLUoxetine (PROZAC) 20 MG capsule Take 3 capsules (60 mg total) by mouth daily. 90 capsule 0  . HYDROcodone-acetaminophen (NORCO/VICODIN) 5-325 MG tablet Take 1 tablet by mouth every 6 (six) hours as needed. 6 tablet 0  . Magnesium 400 MG CAPS Take 1 tablet by mouth daily.     . Multiple Vitamin (MULTIVITAMIN WITH MINERALS) TABS tablet Take 1 tablet by mouth 2 (two) times daily.     . ondansetron (ZOFRAN ODT) 4 MG disintegrating tablet Take 1 tablet (4 mg total) by mouth every 8 (eight) hours as needed for nausea or vomiting. 20 tablet 0  . topiramate (TOPAMAX) 100 MG  tablet Take 1 tablet (100 mg total) by mouth 2 (two) times daily. 360 tablet 3  . ziprasidone (GEODON) 60 MG capsule Take 1 capsule (60 mg total) by mouth at bedtime. 30 capsule 0   No current facility-administered medications on file prior to visit.     Current Medications (verified) Current Outpatient Prescriptions  Medication Sig Dispense Refill  . clonazePAM (KLONOPIN) 0.5 MG tablet TAKE 1 TABLET BY MOUTH 3 TIMES DAILY. (Patient taking differently: Take 0.5 mg by mouth 3 (three) times daily as needed for anxiety. ) 90 tablet 0  . Cyanocobalamin (B-12) 1000 MCG SUBL Place 1,000 mcg under the tongue daily. 30 each 6  . dicyclomine (BENTYL) 10 MG capsule Take 1 capsule (10 mg total) by mouth 4 (four) times daily -  before meals and at bedtime. 120 capsule 2  . FLUoxetine (PROZAC) 20 MG capsule Take 3 capsules (60 mg total) by mouth daily. 90 capsule 0  . HYDROcodone-acetaminophen (NORCO/VICODIN) 5-325 MG tablet Take 1 tablet by mouth every 6 (six) hours as needed. 6 tablet 0  . Magnesium 400 MG CAPS Take 1 tablet by mouth daily.     . Multiple Vitamin (MULTIVITAMIN WITH MINERALS) TABS tablet Take 1 tablet by mouth 2 (two) times daily.     . norethindrone-ethinyl estradiol (JUNEL FE,GILDESS FE,LOESTRIN FE) 1-20 MG-MCG tablet Take 1 tablet by mouth daily.    . ondansetron (ZOFRAN ODT) 4 MG disintegrating tablet Take 1 tablet (4 mg  total) by mouth every 8 (eight) hours as needed for nausea or vomiting. 20 tablet 0  . topiramate (TOPAMAX) 100 MG tablet Take 1 tablet (100 mg total) by mouth 2 (two) times daily. 360 tablet 3  . ziprasidone (GEODON) 60 MG capsule Take 1 capsule (60 mg total) by mouth at bedtime. 30 capsule 0   No current facility-administered medications for this visit.      Allergies (verified) Feraheme [ferumoxytol] and Penicillins   PAST HISTORY  Family History Family History  Problem Relation Age of Onset  . Hypercholesterolemia Mother   . Hypertension Mother      Iterstitial Cystist  . Hyperlipidemia Mother   . Cancer Mother 13    breast   . Depression Brother   . Alcohol abuse Brother   . Colon polyps Father   . Depression Father   . Irritable bowel syndrome Father   . Alcohol abuse Father   . Colon cancer Paternal Aunt 66  . Heart attack Paternal Grandfather   . Kidney cancer Paternal Grandfather   . Cancer Maternal Grandfather     unknown type    Social History Social History  Substance Use Topics  . Smoking status: Former Smoker    Packs/day: 1.00    Years: 5.00    Types: Cigarettes    Quit date: 08/26/1997  . Smokeless tobacco: Never Used  . Alcohol use No     Are there smokers in your home (other than you)? No  Risk Factors Current exercise habits: The patient does not participate in regular exercise at present.  Dietary issues discussed: Patient is currently under the care of a nutritionist    Cardiac risk factors: none and .Marland Kitchen  Depression Screen- currently under the care of Psychiatry  (Note: if answer to either of the following is "Yes", a more complete depression screening is indicated)   Over the past two weeks, have you felt down, depressed or hopeless? No  Over the past two weeks, have you felt little interest or pleasure in doing things? No  Have you lost interest or pleasure in daily life? No  Do you often feel hopeless? No  Do you cry easily over simple problems? No  Activities of Daily Living In your present state of health, do you have any difficulty performing the following activities?:  Driving? No Managing money?  No Feeding yourself? No Getting from bed to chair? No Climbing a flight of stairs? No Preparing food and eating?: No Bathing or showering? No Getting dressed: No Getting to the toilet? No Using the toilet:No Moving around from place to place: No In the past year have you fallen or had a near fall?:Yes   Are you sexually active?  Yes  Do you have more than one partner?  No  Hearing  Difficulties: No Do you often ask people to speak up or repeat themselves? No Do you experience ringing or noises in your ears? No Do you have difficulty understanding soft or whispered voices? No   Do you feel that you have a problem with memory? No  Do you often misplace items? No  Do you feel safe at home?  Yes  Cognitive Testing  Alert? Yes  Normal Appearance?Yes  Oriented to person? Yes  Place? Yes   Time? Yes  Recall of three objects?  Yes  Can perform simple calculations? Yes  Displays appropriate judgment?Yes  Can read the correct time from a watch face?Yes   Advanced Directives have been discussed with the  patient? No  List the Names of Other Physician/Practitioners you currently use: 1.  Dr. Whitney Muse (hematology) 2. Dr. Adele Schilder  (psychiatry) 3.  Dr. Johnsie Cancel (cardiology)  4. Dietician-Laura Wonda Amis any recent Medical Services you may have received from other than [facility] providers in the past year (date may be approximate).  Immunization History  Administered Date(s) Administered  . Influenza Split 03/03/2012, 02/18/2015  . Influenza,inj,Quad PF,36+ Mos 01/31/2016  . PPD Test 12/27/2014  . Tdap 03/31/2011    Screening Tests Health Maintenance  Topic Date Due  . COLONOSCOPY  04/26/2017  . PAP SMEAR  09/06/2017  . TETANUS/TDAP  03/30/2021  . INFLUENZA VACCINE  Completed  . HIV Screening  Completed    All answers were reviewed with the patient and necessary referrals were made:  Vanetta Mulders, LPN   [id number]   History reviewed: allergies, current medications, past family history, past medical history, past social history, past surgical history and problem list  Review of Systems A comprehensive review of systems was negative.    Objective:     Vision by Snellen chart: right eye:20/20, left eye:20/20  Body mass index is 27.12 kg/m. BP 122/64   Pulse 62   Temp 99 F (37.2 C)   Resp 18   Ht 5\' 5"  (1.651 m)   Wt 163 lb (73.9 kg)    BMI 27.12 kg/m   No exam performed today, annual wellness without physical exam.     Assessment:       Plan:     During the course of the visit the patient was educated and counseled about appropriate screening and preventive services including:    Hematology follow up  Diet review for nutrition referral? Yes ____  Not Indicated _x___   Patient Instructions (the written plan) was given to the patient.  Medicare Attestation I have personally reviewed: The patient's medical and social history Their use of alcohol, tobacco or illicit drugs Their current medications and supplements The patient's functional ability including ADLs,fall risks, home safety risks, cognitive, and hearing and visual impairment Diet and physical activities Evidence for depression or mood disorders  The patient's weight, height, BMI, and visual acuity have been recorded in the chart.  I have made referrals, counseling, and provided education to the patient based on review of the above and I have provided the patient with a written personalized care plan for preventive services.     Denman George [place], [place]   [id number]

## 2016-02-05 NOTE — Telephone Encounter (Signed)
Spoke with patient and she will log her temperatures x 1 week at bedtime.  Will follow up with her on 10/17 or 10/18

## 2016-02-08 DIAGNOSIS — F3112 Bipolar disorder, current episode manic without psychotic features, moderate: Secondary | ICD-10-CM | POA: Diagnosis not present

## 2016-02-08 DIAGNOSIS — F411 Generalized anxiety disorder: Secondary | ICD-10-CM | POA: Diagnosis not present

## 2016-02-08 DIAGNOSIS — R630 Anorexia: Secondary | ICD-10-CM | POA: Diagnosis not present

## 2016-02-09 ENCOUNTER — Encounter (HOSPITAL_COMMUNITY): Payer: Self-pay | Admitting: Hematology & Oncology
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
## 2016-02-15 DIAGNOSIS — F411 Generalized anxiety disorder: Secondary | ICD-10-CM | POA: Diagnosis not present

## 2016-02-15 DIAGNOSIS — F3112 Bipolar disorder, current episode manic without psychotic features, moderate: Secondary | ICD-10-CM | POA: Diagnosis not present

## 2016-02-15 DIAGNOSIS — R630 Anorexia: Secondary | ICD-10-CM | POA: Diagnosis not present

## 2016-02-18 ENCOUNTER — Ambulatory Visit (INDEPENDENT_AMBULATORY_CARE_PROVIDER_SITE_OTHER): Payer: Managed Care, Other (non HMO) | Admitting: Psychiatry

## 2016-02-18 ENCOUNTER — Encounter (HOSPITAL_COMMUNITY): Payer: Self-pay | Admitting: Psychiatry

## 2016-02-18 DIAGNOSIS — Z79899 Other long term (current) drug therapy: Secondary | ICD-10-CM

## 2016-02-18 DIAGNOSIS — F331 Major depressive disorder, recurrent, moderate: Secondary | ICD-10-CM

## 2016-02-18 MED ORDER — CLONAZEPAM 0.5 MG PO TABS: 0.5000 mg | ORAL_TABLET | Freq: Three times a day (TID) | ORAL | 0 refills | Status: DC | PRN

## 2016-02-18 MED ORDER — BUPROPION HCL 100 MG PO TABS: ORAL_TABLET | ORAL | 0 refills | Status: DC

## 2016-02-18 NOTE — Patient Instructions (Addendum)
1. Continue fluoxetine 40 mg daily 2. Continue Geodon 60 mg daily 3. Start Wellbutrin 150 mg daily for two weeks, then 150 mg twice a day 4. Continue clonazepam 0.5 mg three times a day as needed for anxiety 5. Return to clinic in one month

## 2016-02-20 DIAGNOSIS — F3112 Bipolar disorder, current episode manic without psychotic features, moderate: Secondary | ICD-10-CM | POA: Diagnosis not present

## 2016-02-20 DIAGNOSIS — F411 Generalized anxiety disorder: Secondary | ICD-10-CM | POA: Diagnosis not present

## 2016-02-28 DIAGNOSIS — F411 Generalized anxiety disorder: Secondary | ICD-10-CM | POA: Diagnosis not present

## 2016-02-28 DIAGNOSIS — R630 Anorexia: Secondary | ICD-10-CM | POA: Diagnosis not present

## 2016-02-28 DIAGNOSIS — F3112 Bipolar disorder, current episode manic without psychotic features, moderate: Secondary | ICD-10-CM | POA: Diagnosis not present

## 2016-03-05 ENCOUNTER — Telehealth: Payer: Self-pay

## 2016-03-05 DIAGNOSIS — F411 Generalized anxiety disorder: Secondary | ICD-10-CM | POA: Diagnosis not present

## 2016-03-05 DIAGNOSIS — Z9884 Bariatric surgery status: Secondary | ICD-10-CM

## 2016-03-05 DIAGNOSIS — F3112 Bipolar disorder, current episode manic without psychotic features, moderate: Secondary | ICD-10-CM | POA: Diagnosis not present

## 2016-03-05 DIAGNOSIS — R630 Anorexia: Secondary | ICD-10-CM | POA: Diagnosis not present

## 2016-03-05 NOTE — Telephone Encounter (Signed)
Referral entered as requested.  
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
## 2016-03-14 DIAGNOSIS — R630 Anorexia: Secondary | ICD-10-CM | POA: Diagnosis not present

## 2016-03-14 DIAGNOSIS — F411 Generalized anxiety disorder: Secondary | ICD-10-CM | POA: Diagnosis not present

## 2016-03-14 DIAGNOSIS — F3112 Bipolar disorder, current episode manic without psychotic features, moderate: Secondary | ICD-10-CM | POA: Diagnosis not present

## 2016-03-17 ENCOUNTER — Ambulatory Visit (INDEPENDENT_AMBULATORY_CARE_PROVIDER_SITE_OTHER): Payer: Managed Care, Other (non HMO) | Admitting: Psychiatry

## 2016-03-17 ENCOUNTER — Encounter (HOSPITAL_COMMUNITY): Payer: Self-pay | Admitting: Psychiatry

## 2016-03-17 DIAGNOSIS — F331 Major depressive disorder, recurrent, moderate: Secondary | ICD-10-CM | POA: Diagnosis not present

## 2016-03-17 DIAGNOSIS — Z79899 Other long term (current) drug therapy: Secondary | ICD-10-CM | POA: Diagnosis not present

## 2016-03-17 MED ORDER — FLUOXETINE HCL 20 MG PO TABS: 20.0000 mg | ORAL_TABLET | Freq: Every day | ORAL | 0 refills | Status: DC

## 2016-03-17 MED ORDER — ZIPRASIDONE HCL 60 MG PO CAPS: 60.0000 mg | ORAL_CAPSULE | Freq: Every day | ORAL | 0 refills | Status: DC

## 2016-03-17 MED ORDER — BUPROPION HCL 100 MG PO TABS: ORAL_TABLET | ORAL | 0 refills | Status: DC

## 2016-03-17 NOTE — Patient Instructions (Addendum)
1. Continue fluoxetine 40 mg daily 2. Continue Geodon 60 mg daily 3. Continue Wellbutrin 150 mg twice a day 4. Continue clonazepam 0.5 mg three times a day as needed for anxiety 5. Return to clinic in one month

## 2016-03-25 ENCOUNTER — Other Ambulatory Visit (HOSPITAL_COMMUNITY): Payer: Self-pay | Admitting: Psychiatry

## 2016-03-25 ENCOUNTER — Telehealth (HOSPITAL_COMMUNITY): Payer: Self-pay

## 2016-03-25 MED ORDER — FLUOXETINE HCL 20 MG PO TABS: 40.0000 mg | ORAL_TABLET | Freq: Every day | ORAL | 0 refills | Status: DC

## 2016-03-25 NOTE — Telephone Encounter (Signed)
The order was in error and it is 40 mg daily. Put corrected order through chart.

## 2016-03-25 NOTE — Telephone Encounter (Signed)
Medication management - Telephone call from pharmacist at [facility] who wanted to verify patient's current Prozac order and instructions due to only 20 mg sent in. Their last order was for 60 mg and last note states 40 mg daily.  Pharmacy requests call back or corrected prescription for 90 days for patient.

## 2016-03-25 NOTE — Telephone Encounter (Signed)
Medication management - Telephone call with Caroleen Hamman, pharmacist at [facility] to inform Dr. Modesta Messing sent a new order for patient's Prozac this date to clarify recent change to 20 mg, 2 a day.

## 2016-03-28 DIAGNOSIS — F411 Generalized anxiety disorder: Secondary | ICD-10-CM | POA: Diagnosis not present

## 2016-03-28 DIAGNOSIS — F3112 Bipolar disorder, current episode manic without psychotic features, moderate: Secondary | ICD-10-CM | POA: Diagnosis not present

## 2016-03-28 DIAGNOSIS — R630 Anorexia: Secondary | ICD-10-CM | POA: Diagnosis not present

## 2016-04-11 DIAGNOSIS — R630 Anorexia: Secondary | ICD-10-CM | POA: Diagnosis not present

## 2016-04-11 DIAGNOSIS — F411 Generalized anxiety disorder: Secondary | ICD-10-CM | POA: Diagnosis not present

## 2016-04-11 DIAGNOSIS — F3112 Bipolar disorder, current episode manic without psychotic features, moderate: Secondary | ICD-10-CM | POA: Diagnosis not present

## 2016-04-17 ENCOUNTER — Encounter: Payer: Managed Care, Other (non HMO) | Attending: Family Medicine | Admitting: *Deleted

## 2016-04-17 ENCOUNTER — Encounter: Payer: Self-pay | Admitting: *Deleted

## 2016-04-17 DIAGNOSIS — Z9884 Bariatric surgery status: Secondary | ICD-10-CM | POA: Diagnosis not present

## 2016-04-17 DIAGNOSIS — Z713 Dietary counseling and surveillance: Secondary | ICD-10-CM | POA: Diagnosis present

## 2016-04-17 DIAGNOSIS — D509 Iron deficiency anemia, unspecified: Secondary | ICD-10-CM

## 2016-04-17 DIAGNOSIS — F509 Eating disorder, unspecified: Secondary | ICD-10-CM

## 2016-04-17 NOTE — Progress Notes (Signed)
Medical Nutrition Therapy:  Appt start time: 1400 end time:  1500.   Assessment:  Primary concerns today: Katelyn [name] is here for follow up nutrition counseling pertaining to new referral for suspicion of dumping syndrome.  PCP request bariatric RD, but given [name]'s extensive history of disordered eating, she was scheduled with this provider whom she saw regularly for this issue, but not in several months.   Melayna states medically she has not been doing well.  She has been running low-grade fever for months and they can't figure out why.  HR was 170 bbp and was taken to hospital.  given fluids.  Had bad UTI and fever was 105 and that's when the low grade fever started and never went away.  Saw urology and ultrasound was fine, couldn't figure out why she was getting infections.  Went to cards who wanted tests run, but did not get tests done.  Talked with PCP and she feels tachycardia was from dumping syndrome and diet related.  She has not had dumping syndrome before,but it started after the fever/infection.   Now she's getting "dumping syndrome" with not feeling well and heart racing.  Has experience chronic diarrhea and there are no patterns, low calcium, low iron, low potassium, low b12 (not responding well).  PCP thinks this is bariatric related.  [name] is not following a specific diet and tries to limit foods that bother her.  States everything  Bothers her.  Still reactive hypoglycemia and limits concentrated sweet. [name] thinks it's anxiety related.  Things are not going well with children and ex-husband. Still sees same person for therapy once a week. [name] manages psychiatric medication who is out on medical leave.  Saw someone else who prescribed Wellbutrin for a month and it's helping, but anxiety is still worse  Thinks she is eating, but not eating right and its' because of her anxiety.  She is maintaining 160 lb, but she's struggling with that.  She is trying to meet herself with  compassion.  She is trying to meet her basic needs, but she isn't getting balanced nutrition.  She is getting enough calories, but not enough nutrients.  She is tempted to restrict, but has a motivation to say well. She has a guy who she wants to be well for, but he doesn't know her struggles Has appointment with regular psych in January  Is not sleeping well.  She can fall asleep, but can't stay asleep she started smoking again  MEDICATIONS: see list   DIETARY INTAKE:  Usual eating pattern includes 3 meals and ad lib snacks per day.  Fear/Avoided foods include starches, fast food, fats, breakfast meat, butter, pizza.    24-hr recall:  B ( AM): greek yogurt (dannon lit and fit) and granola (1/4 cup) Snk ( AM): none  L ( PM): none Snk ( PM): 3pm bean and ham soup (1/2 cup) D ( PM): little more soup Snk ( PM): none Beverages: coffee with SF creamer, decaff ice tea, diet soda (caffeine and not), crystal light  Normally B: yogurt and granola L: salad with chicken and full fat ranch D: casserole; taco; frozen dinner Snacks in evening: reduced fat wheat thin, circus peanuts  Usual physical activity: obsessive cleaning.  All day.  Knows it's obsessive;  Wants to joining [facility] [employer] to get some energy out.   Does yoga at home (power yoga from [employer])  TANITA  BODY COMP RESULTS  04/17/16   BMI (kg/m^2) 26.5   Fat Mass (  lbs) 50.8   Fat Free Mass (lbs) 108.2   Total Body Water (lbs) 76.4     Estimated energy needs: 2000 calories 250 g carbohydrates 100 g protein 67 g fat    Nutritional Diagnosis:  NB-1.5 Disordered eating pattern As related to meal skipping and binging .  As evidenced by dietary recall.    Intervention:  Nutrition counseling provided.  [name] has had extensive nutrition education/counseling from 2 bariatric RDs and this RD as well as residential treatment for her eating disorder.  She is not ignorant on nutrition.  I asked what she thinks the issue(s) is and  she responded: ANXIETY.  She knows how to avoid dumping syndrome and knows how to balance meals and snacks, she just isn't due to anxiety and lingering disordered eating thoughts.  She knows she isn't getting enough protein.  Dumping syndrome occurs with high intakes of refined carbs without balanced protein and/or fat.  She binges on refined carbs when she doesn't get enough food throughout the day.  Anxiety affects so many part of the body and it's possible her symptoms could be related to anxiety and/or disordered eating.  She restricts throughout the day and then binges often.  She gets inadequate protein, fat, fiber, and total energy, then gets excessive refined carbohydrates, possibly contributing to the reactive hypoglycemia and GI symptoms.  Discussed need for balanced nutrition throughout the day . Recommended increase water and decreased caffeine  Monitoring/Evaluation:   in a few week(s).

## 2016-04-17 NOTE — Patient Instructions (Addendum)
Reminder: carbs are neccessary for your brain function Eating more during the day will help you from binging as much and restore metabolism Talk with psychiatrist about sleeping and anxiety Talk with [name] about talking with Paul/getting support   Limit coffee to 2-3 cups Limit decaff tea to 2 glasses increase water by using bottle, fill once daily  B: 1/2 cup granola and 2/3 cup yogurt S: apple and peanut butter; 1/2 sandwic L: salad with chicken and ranch and 1/2 cup crutons S: apple and peanut butter; 1/2 sandwich D: whatever: needs protein, starch, and fat and vegetable  Next visit 05/07/16 at 8

## 2016-04-28 HISTORY — PX: TUBAL LIGATION: SHX77

## 2016-04-29 ENCOUNTER — Ambulatory Visit (INDEPENDENT_AMBULATORY_CARE_PROVIDER_SITE_OTHER): Payer: Self-pay | Admitting: Internal Medicine

## 2016-05-01 DIAGNOSIS — F411 Generalized anxiety disorder: Secondary | ICD-10-CM | POA: Diagnosis not present

## 2016-05-01 DIAGNOSIS — F3112 Bipolar disorder, current episode manic without psychotic features, moderate: Secondary | ICD-10-CM | POA: Diagnosis not present

## 2016-05-01 DIAGNOSIS — R630 Anorexia: Secondary | ICD-10-CM | POA: Diagnosis not present

## 2016-05-02 ENCOUNTER — Encounter: Payer: Self-pay | Admitting: Family Medicine

## 2016-05-02 ENCOUNTER — Ambulatory Visit (INDEPENDENT_AMBULATORY_CARE_PROVIDER_SITE_OTHER): Payer: Managed Care, Other (non HMO) | Admitting: Family Medicine

## 2016-05-02 VITALS — BP 120/70 | HR 76 | Temp 98.5°F | Resp 16 | Ht 65.0 in | Wt 159.0 lb

## 2016-05-02 DIAGNOSIS — Z9884 Bariatric surgery status: Secondary | ICD-10-CM | POA: Diagnosis not present

## 2016-05-02 DIAGNOSIS — R51 Headache: Secondary | ICD-10-CM | POA: Diagnosis not present

## 2016-05-02 DIAGNOSIS — R519 Headache, unspecified: Secondary | ICD-10-CM

## 2016-05-02 DIAGNOSIS — R1033 Periumbilical pain: Secondary | ICD-10-CM

## 2016-05-02 MED ORDER — ONDANSETRON HCL 4 MG PO TABS: ORAL_TABLET | ORAL | 2 refills | Status: DC

## 2016-05-02 MED ORDER — CYCLOBENZAPRINE HCL 5 MG PO TABS: ORAL_TABLET | ORAL | 2 refills | Status: DC

## 2016-05-02 NOTE — Patient Instructions (Signed)
I have refilled the zofran and flexeril Take as directed Refills provided See neurologist in follow up Call for problems

## 2016-05-02 NOTE — Progress Notes (Signed)
Chief Complaint  Patient presents with  . Headache  . Abdominal Pain    x 3 days   Here for acute visit, cared for by PCP [name], MD Has a chronic headache condition cared for by Neurology.  She usually takes flexeril 5 and zofran 4 with good results.  Is out of these medications.  Has had a headache now for a couple of days.  No cause identified - injury, stress, infection.  No nausea or vomiting, no neuro symptoms or photophobia or aura Also has abdominal pain, central.  Worse with food.  Has had bariatric surgery, states she has not had heartburn or swallowing trouble.  This has also been present a couple of days.  No vomiting or diarrhea.  (cannot vomit - per pt since surgery).  No fever or chills.  No change in bowels.  Past history IBS.  Weight stable. Patient is a Therapist, sports , disabled due to mental illness.   Patient Active Problem List   Diagnosis Date Noted  . Bariatric surgery status 05/02/2016  . Major depressive disorder, recurrent episode, moderate ([place]) 02/18/2016  . Iron deficiency anemia 06/10/2015  . Sleep disorder 06/01/2013  . Generalized anxiety disorder 05/30/2013  . Chronic headaches 07/01/2012  . Depression, major, recurrent, severe with psychosis ([place]) 06/17/2012  . Family hx colonic polyps 03/03/2012  . Eating disorder 12/18/2011  . POLYCYSTIC OVARIAN DISEASE 09/01/2008    Outpatient Encounter Prescriptions as of 05/02/2016  Medication Sig  . buPROPion (WELLBUTRIN) 100 MG tablet 150 mg daily for two weeks, then 150 mg twice a day  . clonazePAM (KLONOPIN) 0.5 MG tablet Take 1 tablet (0.5 mg total) by mouth 3 (three) times daily as needed for anxiety.  . Cyanocobalamin (B-12) 1000 MCG SUBL Place 1,000 mcg under the tongue daily.  Marland Kitchen dicyclomine (BENTYL) 10 MG capsule Take 1 capsule (10 mg total) by mouth 4 (four) times daily -  before meals and at bedtime.  Marland Kitchen FLUoxetine (PROZAC) 20 MG tablet Take 2 tablets (40 mg total) by mouth daily.  Marland Kitchen  HYDROcodone-acetaminophen (NORCO/VICODIN) 5-325 MG tablet Take 1 tablet by mouth every 6 (six) hours as needed.  . Magnesium 400 MG CAPS Take 1 tablet by mouth daily.   . Multiple Vitamin (MULTIVITAMIN WITH MINERALS) TABS tablet Take 1 tablet by mouth 2 (two) times daily.   . norethindrone-ethinyl estradiol (JUNEL FE,GILDESS FE,LOESTRIN FE) 1-20 MG-MCG tablet Take 1 tablet by mouth daily.  . ondansetron (ZOFRAN ODT) 4 MG disintegrating tablet Take 1 tablet (4 mg total) by mouth every 8 (eight) hours as needed for nausea or vomiting.  . topiramate (TOPAMAX) 100 MG tablet Take 1 tablet (100 mg total) by mouth 2 (two) times daily.  . ziprasidone (GEODON) 60 MG capsule Take 1 capsule (60 mg total) by mouth at bedtime.  . cyclobenzaprine (FLEXERIL) 5 MG tablet Take as needed headache  . ondansetron (ZOFRAN) 4 MG tablet Take as needed headache   No facility-administered encounter medications on file as of 05/02/2016.     Allergies  Allergen Reactions  . Feraheme [Ferumoxytol] Hives  . Penicillins     Has patient had a PCN reaction causing immediate rash, facial/tongue/throat swelling, SOB or lightheadedness with hypotension: Yes Has patient had a PCN reaction causing severe rash involving mucus membranes or skin necrosis: No Has patient had a PCN reaction that required hospitalization No Has patient had a PCN reaction occurring within the last 10 years: No If all of the above answers are "NO",  then may proceed with Cephalosporin use.     REACTION: Rash    Review of Systems  Constitutional: Negative for activity change, appetite change, fatigue and fever.  HENT: Negative for congestion and sinus pressure.   Eyes: Negative for photophobia and visual disturbance.  Respiratory: Negative for cough and shortness of breath.   Cardiovascular: Negative for chest pain and palpitations.  Gastrointestinal: Positive for abdominal pain and nausea. Negative for constipation, diarrhea and vomiting.    Genitourinary: Negative for difficulty urinating and frequency.  Musculoskeletal: Negative for neck pain and neck stiffness.  Neurological: Positive for headaches. Negative for dizziness.  Psychiatric/Behavioral: Positive for dysphoric mood. Negative for sleep disturbance.       Under care chronic depression     BP 120/70 (BP Location: Right Arm, Patient Position: Sitting, Cuff Size: Normal)   Pulse 76   Temp 98.5 F (36.9 C) (Oral)   Resp 16   Ht 5\' 5"  (1.651 m)   Wt 159 lb (72.1 kg)   LMP 04/26/2016 (Exact Date)   SpO2 100%   BMI 26.46 kg/m   Physical Exam  Constitutional: She is oriented to person, place, and time. She appears well-developed and well-nourished.  NAD  HENT:  Head: Normocephalic and atraumatic.  Right Ear: External ear normal.  Left Ear: External ear normal.  Mouth/Throat: Oropharynx is clear and moist.  Eyes: Conjunctivae are normal. Pupils are equal, round, and reactive to light.  Discs flat  Neck: Normal range of motion. Neck supple. No thyromegaly present.  Cardiovascular: Normal rate, regular rhythm and normal heart sounds.   Pulmonary/Chest: Effort normal and breath sounds normal. No respiratory distress.  Abdominal: Soft. Bowel sounds are normal. There is no hepatosplenomegaly. There is tenderness in the periumbilical area.  Deep palpation central  Musculoskeletal: Normal range of motion. She exhibits no edema.  Lymphadenopathy:    She has no cervical adenopathy.  Neurological: She is alert and oriented to person, place, and time. No cranial nerve deficit. Coordination normal.  Gait normal  Skin: Skin is warm and dry.  Psychiatric: She has a normal mood and affect. Her behavior is normal. Thought content normal.  Nursing note and vitals reviewed.   ASSESSMENT/PLAN:  1. Chronic intractable headache, unspecified headache type   2. Periumbilical abdominal pain   3. Bariatric surgery status    Patient Instructions  I have refilled the  zofran and flexeril Take as directed Refills provided See neurologist in follow up Call for problems   Katelyn Everts, MD

## 2016-05-06 ENCOUNTER — Ambulatory Visit (INDEPENDENT_AMBULATORY_CARE_PROVIDER_SITE_OTHER): Payer: Managed Care, Other (non HMO) | Admitting: Psychiatry

## 2016-05-06 ENCOUNTER — Encounter (HOSPITAL_COMMUNITY): Payer: Self-pay | Admitting: Psychiatry

## 2016-05-06 VITALS — BP 118/72 | HR 73 | Ht 65.5 in | Wt 158.8 lb

## 2016-05-06 DIAGNOSIS — Z87891 Personal history of nicotine dependence: Secondary | ICD-10-CM

## 2016-05-06 DIAGNOSIS — Z79899 Other long term (current) drug therapy: Secondary | ICD-10-CM

## 2016-05-06 DIAGNOSIS — Z9889 Other specified postprocedural states: Secondary | ICD-10-CM | POA: Diagnosis not present

## 2016-05-06 DIAGNOSIS — Z803 Family history of malignant neoplasm of breast: Secondary | ICD-10-CM | POA: Diagnosis not present

## 2016-05-06 DIAGNOSIS — Z888 Allergy status to other drugs, medicaments and biological substances status: Secondary | ICD-10-CM

## 2016-05-06 DIAGNOSIS — F331 Major depressive disorder, recurrent, moderate: Secondary | ICD-10-CM

## 2016-05-06 DIAGNOSIS — Z880 Allergy status to penicillin: Secondary | ICD-10-CM

## 2016-05-06 DIAGNOSIS — Z8249 Family history of ischemic heart disease and other diseases of the circulatory system: Secondary | ICD-10-CM

## 2016-05-06 DIAGNOSIS — Z8051 Family history of malignant neoplasm of kidney: Secondary | ICD-10-CM

## 2016-05-06 DIAGNOSIS — Z800 Family history of malignant neoplasm of digestive organs: Secondary | ICD-10-CM

## 2016-05-06 DIAGNOSIS — Z811 Family history of alcohol abuse and dependence: Secondary | ICD-10-CM

## 2016-05-06 MED ORDER — BUPROPION HCL 75 MG PO TABS: ORAL_TABLET | ORAL | 0 refills | Status: DC

## 2016-05-06 MED ORDER — TEMAZEPAM 15 MG PO CAPS: 15.0000 mg | ORAL_CAPSULE | Freq: Every evening | ORAL | 1 refills | Status: DC | PRN

## 2016-05-06 MED ORDER — ZIPRASIDONE HCL 60 MG PO CAPS: 60.0000 mg | ORAL_CAPSULE | Freq: Every day | ORAL | 0 refills | Status: DC
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
## 2016-05-07 ENCOUNTER — Encounter: Payer: Self-pay | Admitting: *Deleted

## 2016-05-07 ENCOUNTER — Encounter: Payer: Managed Care, Other (non HMO) | Attending: Family Medicine | Admitting: *Deleted

## 2016-05-07 DIAGNOSIS — D509 Iron deficiency anemia, unspecified: Secondary | ICD-10-CM

## 2016-05-07 DIAGNOSIS — Z713 Dietary counseling and surveillance: Secondary | ICD-10-CM | POA: Insufficient documentation

## 2016-05-07 DIAGNOSIS — Z9884 Bariatric surgery status: Secondary | ICD-10-CM | POA: Diagnosis not present

## 2016-05-07 DIAGNOSIS — F509 Eating disorder, unspecified: Secondary | ICD-10-CM

## 2016-05-07 NOTE — Patient Instructions (Addendum)
Try Culturelle probiotic Try Pronourish shake Do Miralax daily Use a stool when you go for posture   Try to get in multiple small snacks throughout the day: minimum of 4, preferrably closke to 6 Yogurt 8 oz Kefir ProNourish Tomato soup 6 crackers with PB Scrambled eggs Cottage cheese Sugar free pudding Applesauce Beans Vegetable patties (veggie burgers) Shakes Quest bar [employer] Protein bar  Push fluids!! Try sparkling water like [employer] flavored with fruit At least 64 oz Continue to limit caffeine

## 2016-05-07 NOTE — Progress Notes (Signed)
  Medical Nutrition Therapy:  Appt start time: 0800  end time:  0845   Assessment:  Primary concerns today: Katelyn [name] is here for follow up nutrition counseling pertaining to new referral for suspicion of dumping syndrome.  PCP request bariatric RD, but given Kasha's extensive history of disordered eating, she was scheduled with this provider whom she saw regularly for this issue, but not in several months.   In past 2 weeks has been having severe abdominal pain.  She was constipated and went to PCP.  Hasn't been able to eat much. Even fluids hurt.  She  Started back with magnesium and miralax.  Sunday she pooped a little, Monday somewhat better.  She was able to eat Monday evening and Tuesday.  She had been doing liquids mostly.  No belly pain yesterday, but no BM.    Stomach doesn't hurt anymore.  PCP didn't find anything Esteban doesn't think things are right.  She thinks something is "off" with her body She doesn't think her medication are working.  Even with muscle relaxers she isn't sleeping Has not seen bariatric surgeon in awhile  Has not been taking adequate calcium.  Does take Journey mulitvitamin Saw psych yesterday and was prescribed Resteril, but hasn't started that yet and stopped Klonopin.  Did not discuss stomach issue, but mostly focused on sleep.   She knows her anxiety is bad, but has not had much luck finding the right anti anxiety medication. Trying to modify her environment by getting better sleep and eating better and drinking more water  Decreased her coffee and is having more headaches. Is trying to have more water    MEDICATIONS: see list   DIETARY INTAKE:  Usual eating pattern includes 3 meals and ad lib snacks per day.  Fear/Avoided foods include starches, fast food, fats, breakfast meat, butter, pizza.    24-hr recall:  Yesterday had bacon and egg biscuit 8 piece nugget from [employer] This morning had PB crackers  Normally B: yogurt and granola L: salad with  chicken and full fat ranch D: casserole; taco; frozen dinner Snacks in evening: reduced fat wheat thin, circus peanuts  Usual physical activity: obsessive cleaning.  Not right now as she hasn't been feeling well  TANITA  BODY COMP RESULTS  04/17/16 05/07/16   BMI (kg/m^2) 26.5 25.8   Fat Mass (lbs) 50.8 51.8   Fat Free Mass (lbs) 108.2 105.8   Total Body Water (lbs) 76.4 74.6     Estimated energy needs: 2000 calories 250 g carbohydrates 100 g protein 67 g fat    Nutritional Diagnosis:  NB-1.5 Disordered eating pattern As related to meal skipping and binging .  As evidenced by dietary recall.    Intervention:  Nutrition counseling provided. Recommended probiotic and compliance with all medications and supplements.  Discussed eating very small portions multiple times throughout the day with protein and carbohydrate.  Recommended adequate fluids.  Suggested Miralax daily  Monitoring/Evaluation:   in 1 week(s).

## 2016-05-09 DIAGNOSIS — R630 Anorexia: Secondary | ICD-10-CM | POA: Diagnosis not present

## 2016-05-09 DIAGNOSIS — F411 Generalized anxiety disorder: Secondary | ICD-10-CM | POA: Diagnosis not present

## 2016-05-09 DIAGNOSIS — F3112 Bipolar disorder, current episode manic without psychotic features, moderate: Secondary | ICD-10-CM | POA: Diagnosis not present

## 2016-05-12 DIAGNOSIS — Z3009 Encounter for other general counseling and advice on contraception: Secondary | ICD-10-CM | POA: Diagnosis not present

## 2016-05-13 ENCOUNTER — Ambulatory Visit: Payer: Self-pay | Admitting: *Deleted

## 2016-05-16 ENCOUNTER — Telehealth (HOSPITAL_COMMUNITY): Payer: Self-pay

## 2016-05-16 NOTE — Telephone Encounter (Signed)
Patient called about increasing her temazepam from 15 mg to 30 mg at night. Patient states that the current dose is not helping at all. Please review and advise, thank you

## 2016-05-19 MED ORDER — TEMAZEPAM 22.5 MG PO CAPS: 22.5000 mg | ORAL_CAPSULE | Freq: Every evening | ORAL | 0 refills | Status: DC | PRN

## 2016-05-19 NOTE — Telephone Encounter (Signed)
She can try temazepam 22.5 mg.  Please call the new prescription for 30 days.

## 2016-05-19 NOTE — Telephone Encounter (Signed)
Called in the new dose, and had the pharmacy d/c old prescription. Patient is aware of the change.

## 2016-05-20 ENCOUNTER — Telehealth (HOSPITAL_COMMUNITY): Payer: Self-pay | Admitting: *Deleted

## 2016-05-22 ENCOUNTER — Telehealth: Payer: Self-pay | Admitting: Family Medicine

## 2016-05-22 MED ORDER — OSELTAMIVIR PHOSPHATE 75 MG PO CAPS: 75.0000 mg | ORAL_CAPSULE | Freq: Two times a day (BID) | ORAL | 0 refills | Status: AC

## 2016-05-22 NOTE — Telephone Encounter (Signed)
Katelyn [name] is calling stating she believes she has the flu she is c/o fever, runny nose, headache, nausea, chills sorethroat,body aches came on all of a sudden yesterday afternoon states she feels tired & weak , please advise?

## 2016-05-22 NOTE — Telephone Encounter (Signed)
Acute onset of fever Yes.   Headache Yes.   Body ache Yes.   Fatigue Yes.   Non productive cough Yes.   Sore throat Yes.   Nasal discharge Yes.    Onset between 1-4 days of possible exposure Yes.    Recommendation:  Fluid, rest, Tylenol for control of fever  Expect 5 days before feeling better and not so contagious and generally better in 7-10 days.  Standard treatment Tama flu 75 mg 1 tablet twice daily times 5 days  Please call office if symptoms do not improve or worsen in 5 days after beginning treatment.      

## 2016-05-25 ENCOUNTER — Other Ambulatory Visit: Payer: Self-pay | Admitting: Neurology

## 2016-05-26 ENCOUNTER — Encounter: Payer: Managed Care, Other (non HMO) | Admitting: *Deleted

## 2016-05-26 DIAGNOSIS — F509 Eating disorder, unspecified: Secondary | ICD-10-CM

## 2016-05-26 DIAGNOSIS — Z9884 Bariatric surgery status: Secondary | ICD-10-CM

## 2016-05-26 DIAGNOSIS — D509 Iron deficiency anemia, unspecified: Secondary | ICD-10-CM

## 2016-05-26 DIAGNOSIS — Z713 Dietary counseling and surveillance: Secondary | ICD-10-CM | POA: Diagnosis not present

## 2016-05-26 NOTE — Progress Notes (Signed)
  Medical Nutrition Therapy:  Appt start time: 0900  end time:  0930   Assessment:  Primary concerns today: Katelyn [name] is here for follow up nutrition counseling pertaining to new referral for suspicion of dumping syndrome.  PCP request bariatric RD, but given [name]'s extensive history of disordered eating, she was scheduled with this provider whom she saw regularly for this issue, but not in several months.   Knows she has lost weight. Attributes it to being nauseated and having a stomach bug.  She still struggles with constipation.  2 BM/week with milk of magnesia.  Miralax does not work.  She drinks 1 cup regular coffee and a couple cups of decaff.  When she was sick she was drinking diet gingerale.  She was drinking more water beforehand and hasn't quite picked up.    Still struggles with hypoglycemia.  She attributes that to not eating well.   She is sleeping better 4-5 hours/night.  Medication was adjusted.   Is having these episodes where it feels like hypoglycemia, but her sugars are fine.  Could it be anxiety??  It happens out of nowhere and it can last 30 minutes    MEDICATIONS: see list   DIETARY INTAKE:  Usual eating pattern includes 3 meals and ad lib snacks per day.  Fear/Avoided foods include starches, fast food, fats, breakfast meat, butter, pizza.    24-hr recall:  B: nothing    Usual physical activity: obsessive cleaning.  Not right now as she hasn't been feeling well  TANITA  BODY COMP RESULTS  04/17/16 05/07/16 05/26/16   BMI (kg/m^2) 26.5 25.8 24.1   Fat Mass (lbs) 50.8 51.8 48.6   Fat Free Mass (lbs) 108.2 105.8 102.8   Total Body Water (lbs) 76.4 74.6 72.2     Estimated energy needs: 2000 calories 250 g carbohydrates 100 g protein 67 g fat    Nutritional Diagnosis:  NB-1.5 Disordered eating pattern As related to meal skipping and binging .  As evidenced by dietary recall.    Intervention:  Nutrition counseling provided. Advised following up with  bariatric surgeon. Discussed constipation management with daily Miralax and probiotic beverages (kefir/kambucha).  Recommended small, frequent snacks throughout the day to feed her body.  Shakes are ok  Monitoring/Evaluation:   in 2 week(s)  This provider recommended sooner as she continues to lose weight.

## 2016-05-26 NOTE — Patient Instructions (Addendum)
Drink  64 oz water daily Try Miralax daily Continue probiotic Try Kombucha and/or kefir Make follow up appt with Dr. Hassell Done, just to double check everything.   Try Glucerna or Boost Glucose Control Or add sugar free chocolate syrup to vanilla Hide scale  Try to get in something with protein and carbs every couple hours.

## 2016-05-29 ENCOUNTER — Other Ambulatory Visit (HOSPITAL_COMMUNITY): Payer: Self-pay

## 2016-05-29 ENCOUNTER — Telehealth: Payer: Self-pay | Admitting: Family Medicine

## 2016-05-29 NOTE — Telephone Encounter (Signed)
My apoligies (I thought she was a patient here ) she needs to see Dr Moshe Cipro on Friday bcz of her sx and her sons dx of strep (if worse er)

## 2016-05-29 NOTE — Telephone Encounter (Signed)
Patient notified that Dr Nicki Reaper stated:  My apologies (He thought she was a patient here ) she needs to see Dr Moshe Cipro on Friday bcz of her sx and her sons dx of strep (if worse er). Patient verbalized understanding.

## 2016-05-29 NOTE — Telephone Encounter (Signed)
Patient is no longer our patient and is an patient at Dr Griffin Dakin office and has an appointment there tomorrow

## 2016-05-29 NOTE — Telephone Encounter (Signed)
Patients son tested positive for Step A this morning. Pt called and said Dr. Nicki Reaper said he would write an Rx for her if she started running a fever. She began running a fever this afternnon and would like something called in. She asked that the following meds are taken into consideration when witting a prescription: Wellbutrin, Prozac and Geodone. Preferred pharmacy- [facility].

## 2016-05-30 ENCOUNTER — Encounter: Payer: Self-pay | Admitting: Family Medicine

## 2016-05-30 ENCOUNTER — Ambulatory Visit (INDEPENDENT_AMBULATORY_CARE_PROVIDER_SITE_OTHER): Payer: Managed Care, Other (non HMO) | Admitting: Family Medicine

## 2016-05-30 VITALS — BP 112/72 | HR 72 | Temp 98.2°F | Resp 16 | Ht 65.0 in | Wt 151.0 lb

## 2016-05-30 DIAGNOSIS — J029 Acute pharyngitis, unspecified: Secondary | ICD-10-CM | POA: Diagnosis not present

## 2016-05-30 LAB — POCT RAPID STREP A (OFFICE): Rapid Strep A Screen: NEGATIVE

## 2016-05-30 NOTE — Patient Instructions (Signed)
Push fluids Call for problems

## 2016-05-30 NOTE — Progress Notes (Signed)
Chief Complaint  Patient presents with  . Sore Throat   children with  +strep Patient has sore throat, runny nose, PND, sinus congestion and headache No fever or chills Feels tired Concerned for strep No nausea or vomiting No cough or chest congestion   Patient Active Problem List   Diagnosis Date Noted  . Bariatric surgery status 05/02/2016  . Major depressive disorder, recurrent episode, moderate ([place]) 02/18/2016  . Iron deficiency anemia 06/10/2015  . Sleep disorder 06/01/2013  . Generalized anxiety disorder 05/30/2013  . Chronic headaches 07/01/2012  . Depression, major, recurrent, severe with psychosis ([place]) 06/17/2012  . Family hx colonic polyps 03/03/2012  . Eating disorder 12/18/2011  . POLYCYSTIC OVARIAN DISEASE 09/01/2008    Outpatient Encounter Prescriptions as of 05/30/2016  Medication Sig  . buPROPion (WELLBUTRIN) 75 MG tablet Take 2 tab twice a day  . clonazePAM (KLONOPIN) 0.5 MG tablet Take 0.5 mg by mouth daily at 6 (six) AM.  . Cyanocobalamin (B-12) 1000 MCG SUBL Place 1,000 mcg under the tongue daily.  . cyclobenzaprine (FLEXERIL) 5 MG tablet Take as needed headache  . dicyclomine (BENTYL) 10 MG capsule Take 1 capsule (10 mg total) by mouth 4 (four) times daily -  before meals and at bedtime.  Marland Kitchen FLUoxetine (PROZAC) 20 MG tablet Take 2 tablets (40 mg total) by mouth daily.  . Magnesium 400 MG CAPS Take 1 tablet by mouth daily.   . Multiple Vitamin (MULTIVITAMIN WITH MINERALS) TABS tablet Take 1 tablet by mouth 2 (two) times daily.   . norethindrone-ethinyl estradiol (JUNEL FE,GILDESS FE,LOESTRIN FE) 1-20 MG-MCG tablet Take 1 tablet by mouth daily.  . ondansetron (ZOFRAN) 4 MG tablet Take as needed headache  . temazepam (RESTORIL) 22.5 MG capsule Take 1 capsule (22.5 mg total) by mouth at bedtime as needed for sleep.  Marland Kitchen topiramate (TOPAMAX) 100 MG tablet Take 1 tablet (100 mg total) by mouth 2 (two) times daily.  . ziprasidone (GEODON) 60 MG capsule Take 1  capsule (60 mg total) by mouth at bedtime.   No facility-administered encounter medications on file as of 05/30/2016.     Allergies  Allergen Reactions  . Feraheme [Ferumoxytol] Hives  . Penicillins     Has patient had a PCN reaction causing immediate rash, facial/tongue/throat swelling, SOB or lightheadedness with hypotension: Yes Has patient had a PCN reaction causing severe rash involving mucus membranes or skin necrosis: No Has patient had a PCN reaction that required hospitalization No Has patient had a PCN reaction occurring within the last 10 years: No If all of the above answers are "NO", then may proceed with Cephalosporin use.     REACTION: Rash    Review of Systems  Constitutional: Positive for fatigue. Negative for activity change and appetite change.  HENT: Positive for congestion, postnasal drip, sinus pain, sinus pressure and sore throat. Negative for trouble swallowing.   Eyes: Negative for redness.  Respiratory: Negative for cough and wheezing.   Cardiovascular: Negative for chest pain and palpitations.  Gastrointestinal: Negative for nausea and vomiting.  Genitourinary: Negative for difficulty urinating and frequency.  Musculoskeletal: Negative.  Negative for neck pain and neck stiffness.  Neurological: Positive for headaches. Negative for dizziness.     BP 112/72 (BP Location: Right Arm, Patient Position: Sitting, Cuff Size: Normal)   Pulse 72   Temp 98.2 F (36.8 C) (Oral)   Resp 16   Ht 5\' 5"  (1.651 m)   Wt 151 lb 0.6 oz (68.5 kg)  LMP 05/24/2016 (Exact Date)   BMI 25.13 kg/m   Physical Exam  Constitutional: She is oriented to person, place, and time. She appears well-developed and well-nourished. No distress.  tired  HENT:  Head: Normocephalic and atraumatic.  Right Ear: External ear normal.  Left Ear: External ear normal.  Nose: Nose normal.  Mouth/Throat: Oropharynx is clear and moist.  post pharynx mildly red, no exudate  Eyes:  Conjunctivae are normal. Pupils are equal, round, and reactive to light.  Neck: Normal range of motion.  Cardiovascular: Normal rate, regular rhythm and normal heart sounds.   Pulmonary/Chest: Effort normal and breath sounds normal. No respiratory distress.  Abdominal: Soft. Bowel sounds are normal.  Musculoskeletal: Normal range of motion. She exhibits no edema.  Lymphadenopathy:    She has no cervical adenopathy.  Neurological: She is alert and oriented to person, place, and time.  Skin: Skin is warm. No rash noted.  Psychiatric: She has a normal mood and affect. Her behavior is normal. Thought content normal.    ASSESSMENT/PLAN:  1. Sore throat  - POCT rapid strep A Results for orders placed or performed in visit on 05/30/16  POCT rapid strep A  Result Value Ref Range   Rapid Strep A Screen Negative Negative      Patient Instructions  Push fluids Call for problems   [name], MD

## 2016-06-04 ENCOUNTER — Encounter (HOSPITAL_COMMUNITY): Payer: Self-pay

## 2016-06-05 ENCOUNTER — Ambulatory Visit (HOSPITAL_COMMUNITY): Payer: Self-pay | Admitting: Hematology & Oncology

## 2016-06-06 DIAGNOSIS — R630 Anorexia: Secondary | ICD-10-CM | POA: Diagnosis not present

## 2016-06-06 DIAGNOSIS — F3112 Bipolar disorder, current episode manic without psychotic features, moderate: Secondary | ICD-10-CM | POA: Diagnosis not present

## 2016-06-06 DIAGNOSIS — F411 Generalized anxiety disorder: Secondary | ICD-10-CM | POA: Diagnosis not present

## 2016-06-09 ENCOUNTER — Ambulatory Visit: Payer: Self-pay | Admitting: *Deleted

## 2016-06-11 DIAGNOSIS — R630 Anorexia: Secondary | ICD-10-CM | POA: Diagnosis not present

## 2016-06-11 DIAGNOSIS — F411 Generalized anxiety disorder: Secondary | ICD-10-CM | POA: Diagnosis not present

## 2016-06-11 DIAGNOSIS — F3112 Bipolar disorder, current episode manic without psychotic features, moderate: Secondary | ICD-10-CM | POA: Diagnosis not present

## 2016-06-12 ENCOUNTER — Other Ambulatory Visit (HOSPITAL_COMMUNITY): Payer: Self-pay | Admitting: Oncology

## 2016-06-12 ENCOUNTER — Encounter (HOSPITAL_COMMUNITY): Payer: Managed Care, Other (non HMO) | Attending: Oncology

## 2016-06-12 DIAGNOSIS — Z8701 Personal history of pneumonia (recurrent): Secondary | ICD-10-CM | POA: Diagnosis not present

## 2016-06-12 DIAGNOSIS — F419 Anxiety disorder, unspecified: Secondary | ICD-10-CM | POA: Diagnosis not present

## 2016-06-12 DIAGNOSIS — Z803 Family history of malignant neoplasm of breast: Secondary | ICD-10-CM | POA: Insufficient documentation

## 2016-06-12 DIAGNOSIS — D509 Iron deficiency anemia, unspecified: Secondary | ICD-10-CM

## 2016-06-12 DIAGNOSIS — Z9889 Other specified postprocedural states: Secondary | ICD-10-CM | POA: Insufficient documentation

## 2016-06-12 DIAGNOSIS — Z888 Allergy status to other drugs, medicaments and biological substances status: Secondary | ICD-10-CM | POA: Diagnosis not present

## 2016-06-12 DIAGNOSIS — Z8051 Family history of malignant neoplasm of kidney: Secondary | ICD-10-CM | POA: Insufficient documentation

## 2016-06-12 DIAGNOSIS — Z800 Family history of malignant neoplasm of digestive organs: Secondary | ICD-10-CM | POA: Diagnosis not present

## 2016-06-12 DIAGNOSIS — Z79899 Other long term (current) drug therapy: Secondary | ICD-10-CM | POA: Insufficient documentation

## 2016-06-12 DIAGNOSIS — Z808 Family history of malignant neoplasm of other organs or systems: Secondary | ICD-10-CM | POA: Insufficient documentation

## 2016-06-12 DIAGNOSIS — Z8249 Family history of ischemic heart disease and other diseases of the circulatory system: Secondary | ICD-10-CM | POA: Diagnosis not present

## 2016-06-12 DIAGNOSIS — Z811 Family history of alcohol abuse and dependence: Secondary | ICD-10-CM | POA: Insufficient documentation

## 2016-06-12 DIAGNOSIS — Z87891 Personal history of nicotine dependence: Secondary | ICD-10-CM | POA: Insufficient documentation

## 2016-06-12 DIAGNOSIS — F329 Major depressive disorder, single episode, unspecified: Secondary | ICD-10-CM | POA: Insufficient documentation

## 2016-06-12 DIAGNOSIS — G4730 Sleep apnea, unspecified: Secondary | ICD-10-CM | POA: Diagnosis not present

## 2016-06-12 LAB — CBC WITH DIFFERENTIAL/PLATELET
BASOS ABS: 0 10*3/uL (ref 0.0–0.1)
BASOS PCT: 0 %
EOS PCT: 1 %
Eosinophils Absolute: 0.1 10*3/uL (ref 0.0–0.7)
HCT: 41.3 % (ref 36.0–46.0)
Hemoglobin: 14.2 g/dL (ref 12.0–15.0)
LYMPHS PCT: 33 %
Lymphs Abs: 2.2 10*3/uL (ref 0.7–4.0)
MCH: 34.1 pg — ABNORMAL HIGH (ref 26.0–34.0)
MCHC: 34.4 g/dL (ref 30.0–36.0)
MCV: 99.3 fL (ref 78.0–100.0)
Monocytes Absolute: 0.3 10*3/uL (ref 0.1–1.0)
Monocytes Relative: 5 %
NEUTROS ABS: 4.1 10*3/uL (ref 1.7–7.7)
Neutrophils Relative %: 61 %
PLATELETS: 273 10*3/uL (ref 150–400)
RBC: 4.16 MIL/uL (ref 3.87–5.11)
RDW: 13.1 % (ref 11.5–15.5)
WBC: 6.7 10*3/uL (ref 4.0–10.5)

## 2016-06-12 LAB — FERRITIN: Ferritin: 38 ng/mL (ref 11–307)

## 2016-06-18 ENCOUNTER — Ambulatory Visit: Payer: Self-pay | Admitting: Neurology
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
## 2016-06-19 ENCOUNTER — Other Ambulatory Visit (HOSPITAL_COMMUNITY): Payer: Self-pay

## 2016-06-19 ENCOUNTER — Encounter (HOSPITAL_BASED_OUTPATIENT_CLINIC_OR_DEPARTMENT_OTHER): Payer: Managed Care, Other (non HMO) | Admitting: Adult Health

## 2016-06-19 ENCOUNTER — Encounter (HOSPITAL_COMMUNITY): Payer: Self-pay | Admitting: Adult Health

## 2016-06-19 VITALS — BP 123/69 | Temp 98.7°F | Resp 14 | Wt 153.6 lb

## 2016-06-19 DIAGNOSIS — F319 Bipolar disorder, unspecified: Secondary | ICD-10-CM | POA: Diagnosis not present

## 2016-06-19 DIAGNOSIS — E538 Deficiency of other specified B group vitamins: Secondary | ICD-10-CM | POA: Diagnosis not present

## 2016-06-19 DIAGNOSIS — Z9884 Bariatric surgery status: Secondary | ICD-10-CM

## 2016-06-19 DIAGNOSIS — F509 Eating disorder, unspecified: Secondary | ICD-10-CM

## 2016-06-19 DIAGNOSIS — D508 Other iron deficiency anemias: Secondary | ICD-10-CM

## 2016-06-19 DIAGNOSIS — D509 Iron deficiency anemia, unspecified: Secondary | ICD-10-CM

## 2016-06-19 NOTE — Patient Instructions (Signed)
[place] at [facility] Discharge Instructions  RECOMMENDATIONS MADE BY THE CONSULTANT AND ANY TEST RESULTS WILL BE SENT TO YOUR REFERRING PHYSICIAN.  Exam with Mike Craze, NP. We will do labs prior to your next iron infusion appointment. You will start B12 injections on your next iron infusion appointment. These injections will be once a week for 4 weeks and then you will only need the B12 injection monthly. Return to the clinic for follow up in three months. Please see Amy as you leave for appointments.    Thank you for choosing [place] at [facility] to provide your oncology and hematology care.  To afford each patient quality time with our provider, please arrive at least 15 minutes before your scheduled appointment time.    If you have a lab appointment with the [facility] please come in thru the  Main Entrance and check in at the main information desk  You need to re-schedule your appointment should you arrive 10 or more minutes late.  We strive to give you quality time with our providers, and arriving late affects you and other patients whose appointments are after yours.  Also, if you no show three or more times for appointments you may be dismissed from the clinic at the providers discretion.     Again, thank you for choosing [facility].  Our hope is that these requests will decrease the amount of time that you wait before being seen by our physicians.       _____________________________________________________________  Should you have questions after your visit to [facility], please contact our office at [phone] between the hours of 8:30 a.m. and 4:30 p.m.  Voicemails left after 4:30 p.m. will not be returned until the following business day.  For prescription refill requests, have your pharmacy contact our office.       Resources For Cancer Patients and their Caregivers ? American  Cancer Society: Can assist with transportation, wigs, general needs, runs Look Good Feel Better.        928-088-9991 ? Cancer Care: Provides financial assistance, online support groups, medication/co-pay assistance.  1-800-813-HOPE (814)081-2755) ? [place] Assists [place] cancer patients and their families through emotional , educational and financial support.  214-426-2621 ? [place] DSS Where to apply for food stamps, Medicaid and utility assistance. (603)024-5474 ? [facility]: Transportation to medical appointments. (937)075-3846 ? Social Security Administration: May apply for disability if have a Stage IV cancer. 720-886-8163 915-222-3811 ? [employer], Disability and Transit Services: Assists with nutrition, care and transit needs. [place] Support Programs: @10RELATIVEDAYS @ > Cancer Support Group  2nd Tuesday of the month 1pm-2pm, Journey Room  > Creative Journey  3rd Tuesday of the month 1130am-1pm, Journey Room  > Look Good Feel Better  1st Wednesday of the month 10am-12 noon, Journey Room (Call [place] to register [phone])

## 2016-06-20 ENCOUNTER — Ambulatory Visit (HOSPITAL_COMMUNITY): Payer: Self-pay

## 2016-06-24 ENCOUNTER — Ambulatory Visit (INDEPENDENT_AMBULATORY_CARE_PROVIDER_SITE_OTHER): Payer: Managed Care, Other (non HMO) | Admitting: Psychiatry

## 2016-06-24 ENCOUNTER — Encounter (HOSPITAL_COMMUNITY): Payer: Managed Care, Other (non HMO)

## 2016-06-24 ENCOUNTER — Encounter (HOSPITAL_COMMUNITY): Payer: Self-pay | Admitting: Psychiatry

## 2016-06-24 VITALS — BP 122/70 | HR 90 | Ht 65.5 in | Wt 152.8 lb

## 2016-06-24 DIAGNOSIS — D509 Iron deficiency anemia, unspecified: Secondary | ICD-10-CM | POA: Diagnosis not present

## 2016-06-24 DIAGNOSIS — Z79891 Long term (current) use of opiate analgesic: Secondary | ICD-10-CM | POA: Diagnosis not present

## 2016-06-24 DIAGNOSIS — F419 Anxiety disorder, unspecified: Secondary | ICD-10-CM

## 2016-06-24 DIAGNOSIS — Z818 Family history of other mental and behavioral disorders: Secondary | ICD-10-CM | POA: Diagnosis not present

## 2016-06-24 DIAGNOSIS — Z888 Allergy status to other drugs, medicaments and biological substances status: Secondary | ICD-10-CM | POA: Diagnosis not present

## 2016-06-24 DIAGNOSIS — Z8701 Personal history of pneumonia (recurrent): Secondary | ICD-10-CM | POA: Diagnosis not present

## 2016-06-24 DIAGNOSIS — Z880 Allergy status to penicillin: Secondary | ICD-10-CM

## 2016-06-24 DIAGNOSIS — Z9889 Other specified postprocedural states: Secondary | ICD-10-CM | POA: Diagnosis not present

## 2016-06-24 DIAGNOSIS — F331 Major depressive disorder, recurrent, moderate: Secondary | ICD-10-CM | POA: Diagnosis not present

## 2016-06-24 DIAGNOSIS — Z811 Family history of alcohol abuse and dependence: Secondary | ICD-10-CM

## 2016-06-24 DIAGNOSIS — Z79899 Other long term (current) drug therapy: Secondary | ICD-10-CM | POA: Diagnosis not present

## 2016-06-24 DIAGNOSIS — G4730 Sleep apnea, unspecified: Secondary | ICD-10-CM | POA: Diagnosis not present

## 2016-06-24 DIAGNOSIS — Z87891 Personal history of nicotine dependence: Secondary | ICD-10-CM | POA: Diagnosis not present

## 2016-06-24 DIAGNOSIS — F329 Major depressive disorder, single episode, unspecified: Secondary | ICD-10-CM | POA: Diagnosis not present

## 2016-06-24 LAB — CBC WITH DIFFERENTIAL/PLATELET
BASOS ABS: 0 10*3/uL (ref 0.0–0.1)
Basophils Relative: 0 %
Eosinophils Absolute: 0.1 10*3/uL (ref 0.0–0.7)
Eosinophils Relative: 2 %
HCT: 42.9 % (ref 36.0–46.0)
HEMOGLOBIN: 14.9 g/dL (ref 12.0–15.0)
LYMPHS ABS: 1.9 10*3/uL (ref 0.7–4.0)
LYMPHS PCT: 26 %
MCH: 34.2 pg — AB (ref 26.0–34.0)
MCHC: 34.7 g/dL (ref 30.0–36.0)
MCV: 98.4 fL (ref 78.0–100.0)
Monocytes Absolute: 0.4 10*3/uL (ref 0.1–1.0)
Monocytes Relative: 5 %
NEUTROS ABS: 4.8 10*3/uL (ref 1.7–7.7)
NEUTROS PCT: 67 %
Platelets: 289 10*3/uL (ref 150–400)
RBC: 4.36 MIL/uL (ref 3.87–5.11)
RDW: 12.6 % (ref 11.5–15.5)
WBC: 7.2 10*3/uL (ref 4.0–10.5)

## 2016-06-24 LAB — IRON AND TIBC
IRON: 55 ug/dL (ref 28–170)
SATURATION RATIOS: 18 % (ref 10.4–31.8)
TIBC: 312 ug/dL (ref 250–450)
UIBC: 257 ug/dL

## 2016-06-24 LAB — FERRITIN: FERRITIN: 50 ng/mL (ref 11–307)

## 2016-06-24 LAB — VITAMIN B12: Vitamin B-12: 369 pg/mL (ref 180–914)

## 2016-06-24 LAB — FOLATE: Folate: 32.3 ng/mL (ref >5.9)

## 2016-06-24 MED ORDER — FLUOXETINE HCL 20 MG PO TABS: 40.0000 mg | ORAL_TABLET | Freq: Every day | ORAL | 0 refills | Status: DC

## 2016-06-24 MED ORDER — ZIPRASIDONE HCL 80 MG PO CAPS: 80.0000 mg | ORAL_CAPSULE | Freq: Every day | ORAL | 0 refills | Status: DC

## 2016-06-24 MED ORDER — TEMAZEPAM 22.5 MG PO CAPS: 22.5000 mg | ORAL_CAPSULE | Freq: Every evening | ORAL | 2 refills | Status: DC | PRN

## 2016-06-24 MED ORDER — BUPROPION HCL 75 MG PO TABS: ORAL_TABLET | ORAL | 0 refills | Status: DC
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
## 2016-06-25 ENCOUNTER — Encounter (HOSPITAL_BASED_OUTPATIENT_CLINIC_OR_DEPARTMENT_OTHER): Payer: Managed Care, Other (non HMO)

## 2016-06-25 ENCOUNTER — Encounter (HOSPITAL_COMMUNITY): Payer: Self-pay

## 2016-06-25 VITALS — BP 105/72 | HR 81 | Temp 98.5°F | Resp 16

## 2016-06-25 DIAGNOSIS — D508 Other iron deficiency anemias: Secondary | ICD-10-CM | POA: Diagnosis not present

## 2016-06-25 DIAGNOSIS — Z9884 Bariatric surgery status: Secondary | ICD-10-CM | POA: Diagnosis not present

## 2016-06-25 DIAGNOSIS — D509 Iron deficiency anemia, unspecified: Secondary | ICD-10-CM

## 2016-06-25 MED ORDER — SODIUM CHLORIDE 0.9 % IV SOLN
INTRAVENOUS | Status: DC
  Administered 2016-06-25: 14:00:00 via INTRAVENOUS

## 2016-06-25 MED ORDER — FERRIC CARBOXYMALTOSE 750 MG/15ML IV SOLN
750.0000 mg | Freq: Once | INTRAVENOUS | Status: AC
  Administered 2016-06-25: 750 mg via INTRAVENOUS
  Filled 2016-06-25: qty 15

## 2016-06-25 NOTE — Patient Instructions (Signed)
[facility] at [facility] Discharge Instructions  RECOMMENDATIONS MADE BY THE CONSULTANT AND ANY TEST RESULTS WILL BE SENT TO YOUR REFERRING PHYSICIAN.  Injectafer given today Follow up as scheduled.  Thank you for choosing [facility] at [facility] to provide your oncology and hematology care.  To afford each patient quality time with our provider, please arrive at least 15 minutes before your scheduled appointment time.    If you have a lab appointment with the Cancer Center please come in thru the  Main Entrance and check in at the main information desk  You need to re-schedule your appointment should you arrive 10 or more minutes late.  We strive to give you quality time with our providers, and arriving late affects you and other patients whose appointments are after yours.  Also, if you no show three or more times for appointments you may be dismissed from the clinic at the providers discretion.     Again, thank you for choosing [facility].  Our hope is that these requests will decrease the amount of time that you wait before being seen by our physicians.       _____________________________________________________________  Should you have questions after your visit to [facility], please contact our office at [phone] between the hours of 8:30 a.m. and 4:30 p.m.  Voicemails left after 4:30 p.m. will not be returned until the following business day.  For prescription refill requests, have your pharmacy contact our office.       Resources For Cancer Patients and their Caregivers ? American Cancer Society: Can assist with transportation, wigs, general needs, runs Look Good Feel Better.        1-888-227-6333 ? Cancer Care: Provides financial assistance, online support groups, medication/co-pay assistance.  1-800-813-HOPE (4673) ? [facility] Assists [place] cancer patients and  their families through emotional , educational and financial support.  336-427-4357 ? [place] DSS Where to apply for food stamps, Medicaid and utility assistance. 336-342-1394 ? [facility]: Transportation to medical appointments. 336-347-2287 ? Social Security Administration: May apply for disability if have a Stage IV cancer. 336-342-7796 1-800-772-1213 ? [place] Aging, Disability and Transit Services: Assists with nutrition, care and transit needs. 336-349-2343  Cancer Center Support Programs: @10RELATIVEDAYS@ > Cancer Support Group  2nd Tuesday of the month 1pm-2pm, Journey Room  > Creative Journey  3rd Tuesday of the month 1130am-1pm, Journey Room  > Look Good Feel Better  1st Wednesday of the month 10am-12 noon, Journey Room (Call American Cancer Society to register 1-800-395-5775)   

## 2016-06-25 NOTE — Progress Notes (Signed)
Patient does not need b12 injections per G.Dawson NP  injectafer given today per orders. Patient tolerated it well, no problems. Vitals stable and discharged home ambulatory from clinic. Follow up as scheduled.

## 2016-06-26 DIAGNOSIS — E119 Type 2 diabetes mellitus without complications: Secondary | ICD-10-CM | POA: Diagnosis not present

## 2016-06-26 DIAGNOSIS — F3112 Bipolar disorder, current episode manic without psychotic features, moderate: Secondary | ICD-10-CM | POA: Diagnosis not present

## 2016-06-26 DIAGNOSIS — N643 Galactorrhea not associated with childbirth: Secondary | ICD-10-CM | POA: Diagnosis not present

## 2016-06-26 DIAGNOSIS — D649 Anemia, unspecified: Secondary | ICD-10-CM | POA: Diagnosis not present

## 2016-06-26 DIAGNOSIS — F411 Generalized anxiety disorder: Secondary | ICD-10-CM | POA: Diagnosis not present

## 2016-07-01 ENCOUNTER — Ambulatory Visit (HOSPITAL_COMMUNITY): Payer: Self-pay

## 2016-07-02 DIAGNOSIS — R630 Anorexia: Secondary | ICD-10-CM | POA: Diagnosis not present

## 2016-07-02 DIAGNOSIS — F3112 Bipolar disorder, current episode manic without psychotic features, moderate: Secondary | ICD-10-CM | POA: Diagnosis not present

## 2016-07-02 DIAGNOSIS — F411 Generalized anxiety disorder: Secondary | ICD-10-CM | POA: Diagnosis not present

## 2016-07-02 DIAGNOSIS — R300 Dysuria: Secondary | ICD-10-CM | POA: Diagnosis not present

## 2016-07-03 DIAGNOSIS — Z9884 Bariatric surgery status: Secondary | ICD-10-CM | POA: Diagnosis not present

## 2016-07-03 DIAGNOSIS — E119 Type 2 diabetes mellitus without complications: Secondary | ICD-10-CM | POA: Diagnosis not present

## 2016-07-03 DIAGNOSIS — E282 Polycystic ovarian syndrome: Secondary | ICD-10-CM | POA: Diagnosis not present

## 2016-07-03 DIAGNOSIS — N643 Galactorrhea not associated with childbirth: Secondary | ICD-10-CM | POA: Diagnosis not present

## 2016-07-08 ENCOUNTER — Ambulatory Visit (HOSPITAL_COMMUNITY): Payer: Self-pay

## 2016-07-15 ENCOUNTER — Ambulatory Visit (HOSPITAL_COMMUNITY): Payer: Self-pay

## 2016-07-17 ENCOUNTER — Other Ambulatory Visit (HOSPITAL_COMMUNITY): Payer: Self-pay

## 2016-07-17 DIAGNOSIS — F3112 Bipolar disorder, current episode manic without psychotic features, moderate: Secondary | ICD-10-CM | POA: Diagnosis not present

## 2016-07-17 DIAGNOSIS — R630 Anorexia: Secondary | ICD-10-CM | POA: Diagnosis not present

## 2016-07-17 DIAGNOSIS — F411 Generalized anxiety disorder: Secondary | ICD-10-CM | POA: Diagnosis not present

## 2016-07-22 ENCOUNTER — Ambulatory Visit (INDEPENDENT_AMBULATORY_CARE_PROVIDER_SITE_OTHER): Payer: Self-pay | Admitting: Internal Medicine

## 2016-07-24 DIAGNOSIS — Z9884 Bariatric surgery status: Secondary | ICD-10-CM | POA: Diagnosis not present

## 2016-07-31 ENCOUNTER — Encounter: Payer: Managed Care, Other (non HMO) | Attending: Family Medicine | Admitting: *Deleted

## 2016-07-31 ENCOUNTER — Encounter: Payer: Self-pay | Admitting: *Deleted

## 2016-07-31 DIAGNOSIS — Z713 Dietary counseling and surveillance: Secondary | ICD-10-CM | POA: Insufficient documentation

## 2016-07-31 DIAGNOSIS — F509 Eating disorder, unspecified: Secondary | ICD-10-CM

## 2016-07-31 DIAGNOSIS — Z9884 Bariatric surgery status: Secondary | ICD-10-CM | POA: Insufficient documentation

## 2016-07-31 NOTE — Progress Notes (Signed)
  Medical Nutrition Therapy:  Appt start time: 0900  end time:  0930   Assessment:  Primary concerns today: Scotty is here for follow up nutrition counseling  Saw Dr. Hassell Done who said things were ok, per [name].  Thought GI distress was IBS, not anything surgical related.  Takes immodium and miralax  Is concerned because she wants to diet and wants to do so in "a healthy way".  EAT-26 today was a 41   Still seeing same therapist every 3 weeks Depression is worse.  Made appointment with psychiatrist sooner.  Gets worse when she focuses on things like weight Excessively cleaning.  Hasn't found anything she enjoys.  No joy in things Therapist suggest medication changes Knows she is using poor coping skills ie dieting  Not sleeping well  No reactive hypoglycemia  Treadmill, elliptical, stair stepper 30 minutes 3 days/week.  Gardening/excessively cleaning regularly  Insurance is about to change due to divorce being finalized   No dizziness Chronic headaches No N/V.   Heartburn- takes tums Constipation.  BM 2/week Cold intolerance Difficulty focusing Hair loss No trouble chewing or swallowing.  No vision changes   MEDICATIONS: see list   DIETARY INTAKE:  Usual eating pattern includes 3 meals and ad lib snacks per day.  Fear/Avoided foods include starches, fast food, fats, breakfast meat, butter, pizza.    24-hr recall:  Coffee with protein powder decaff coffee Diet sprite Salad with deli ham, 1/2 egg, crunch salad topping, ff ranch Diet cranberry juice 1/2 ham sandwich, goldfish 30 oz water Light Greek yogurt, 2 tbsp PB2 Slice ham   Usual physical activity: obsessive cleaning.  Not right now as she hasn't been feeling well  TANITA  BODY COMP RESULTS  04/17/16 05/07/16 05/26/16   BMI (kg/m^2) 26.5 25.8 24.1   Fat Mass (lbs) 50.8 51.8 48.6   Fat Free Mass (lbs) 108.2 105.8 102.8   Total Body Water (lbs) 76.4 74.6 72.2  % body fat   16.2   Estimated energy  intake: 800  Estimated energy needs: 2000 calories 250 g carbohydrates 100 g protein 67 g fat    Nutritional Diagnosis:  NB-1.5 Disordered eating pattern As related to meal skipping and binging .  As evidenced by dietary recall.    Intervention:  Nutrition counseling provided.  She's not eating enough.  Discussed how dieting is contraindicated in ED recovery Suggested [name] for therapy  Try for 15-20 g higher fiber carbs with each eating occasion Switch out fat free condiments for the regular stuf and use the regular stuff  Monitoring/Evaluation:   Will follow up in 1 week

## 2016-07-31 NOTE — Patient Instructions (Addendum)
Consider [name] at [facility] [phone] You brain needs fuel! Eat to feed your brain  Try for 15-20 g higher fiber carbs with each eating occasion Switch out fat free condiments for the regular stuf and use the regular stuff Fat is important to regulate body temperature.    You're worth it!

## 2016-08-05 DIAGNOSIS — F411 Generalized anxiety disorder: Secondary | ICD-10-CM | POA: Diagnosis not present

## 2016-08-05 DIAGNOSIS — F3112 Bipolar disorder, current episode manic without psychotic features, moderate: Secondary | ICD-10-CM | POA: Diagnosis not present

## 2016-08-05 DIAGNOSIS — R630 Anorexia: Secondary | ICD-10-CM | POA: Diagnosis not present

## 2016-08-06 ENCOUNTER — Ambulatory Visit (HOSPITAL_COMMUNITY): Payer: Self-pay | Admitting: Psychiatry

## 2016-08-07 ENCOUNTER — Encounter: Payer: Managed Care, Other (non HMO) | Admitting: *Deleted

## 2016-08-07 DIAGNOSIS — Z713 Dietary counseling and surveillance: Secondary | ICD-10-CM | POA: Diagnosis not present

## 2016-08-07 DIAGNOSIS — F509 Eating disorder, unspecified: Secondary | ICD-10-CM

## 2016-08-07 NOTE — Patient Instructions (Addendum)
[name] at [facility]  [phone]  Dairy: 2 Fruit: 2 veg: 2 Starch: 4 Pro: 3 Fat: 3  Keep reading Life without Ed Make your divorce decree from your eating disorder Put up your positive affirmations.  Look at them over and over [employer].  You deserve his support.

## 2016-08-07 NOTE — Progress Notes (Signed)
  Medical Nutrition Therapy:  Appt start time: 0800  end time:  0830   Assessment:  Primary concerns today: Katelyn [name] is here for follow up nutrition counseling related to disordered eating Weight is down Ate more over the weekend, but then felt guilty. This is a cycle where she eats during the weekend and restricts during the week. Reread her journal from [name]: Plan ahead could be helpful Positive affirmation- she put those back up Started rereading Life without Ed Got dizzy in the shower today doesn't have energy to exercise anymore.  She is tired so she knows it's catching up with her finally told her therapist.  Talked about how it's important to be healthy for her bf Katelyn [name]  Got some starches, but haven't eaten yet. Forgot to call Katelyn [name]     MEDICATIONS: see list   DIETARY INTAKE:  Fear/Avoided foods include starches, fast food, fats, breakfast meat, butter, pizza.    24-hr recall:  Boiled egg 1.5 oz chicken 1/2 egg drop soup, greek yogurt with PB2 mixed in String cheese and some crackers   Usual physical activity: obsessive cleaning.  Not right now as she hasn't been feeling well  TANITA  BODY COMP RESULTS  04/17/16 05/07/16 05/26/16 08/07/16   BMI (kg/m^2) 26.5 25.8 24.1 22.8   Fat Mass (lbs) 50.8 51.8 48.6 42.2   Fat Free Mass (lbs) 108.2 105.8 102.8 97   Total Body Water (lbs) 76.4 74.6 72.2 67.2  % body fat    30.3   Estimated energy intake: <800  Estimated energy needs: 2000 calories 250 g carbohydrates 100 g protein 67 g fat    Nutritional Diagnosis:  NB-1.5 Disordered eating pattern As related to meal skipping and binging .  As evidenced by dietary recall.    Intervention:  Nutrition counseling provided.  Challenged ED voice. Discussed severity of her illness.  Reiterated Katelyn [name].  Discussed dietary exchanges  Dairy: 2 Fruit: 2 veg: 2 Starch: 4 Pro: 3 Fat: 3  Keep reading Life without Ed Make your divorce decree from your  eating disorder Put up your positive affirmations.  Look at them over and over [employer].  You deserve his support.  Monitoring/Evaluation:   Will follow up in 1 week

## 2016-08-12 ENCOUNTER — Encounter: Payer: Managed Care, Other (non HMO) | Admitting: *Deleted

## 2016-08-12 DIAGNOSIS — F509 Eating disorder, unspecified: Secondary | ICD-10-CM

## 2016-08-12 DIAGNOSIS — Z713 Dietary counseling and surveillance: Secondary | ICD-10-CM | POA: Diagnosis not present

## 2016-08-12 NOTE — Patient Instructions (Addendum)
Dairy: 2 Fruit: 2 veg: 2 Starch: 4 Pro: 3 Fat: 3  Eat breakfast with kids, dinner with kids Show [employer] of lunch Read Declaration every day. Maybe multiple times a day.  Before every meal.  It's not about weight.  Anorexia is: restriction of energy intake relative to requirement (ie less than you need), leading to a significantly low body weight in the context of age, sex, developmental trajectory, and physical health (ie you right now).  significantly low weight is defined as a weight that is less than minimally normal.  ...  disturbance in the way in which one's body weight or shale is experienced, undue influence of body weight or shape on self-evaluation, or persistent lack of recognition of the seriousness of the current low-body weight. (ie you don't how sick you are)

## 2016-08-12 NOTE — Progress Notes (Signed)
  Medical Nutrition Therapy:  Appt start time: 1030  end time:  1115   Assessment:  Primary concerns today: Katelyn [name] is here for follow up nutrition counseling related to disordered eating Weight is down  Made appointment with Silvestre Moment Told her bf about her eating disorder.  Still  Hasn't told parents.  Ate less on weekend with bf thinking that would help her not restrict during week.  Proved not to be the case insurance will be strictly medicare   MEDICATIONS: see list   DIETARY INTAKE:  Fear/Avoided foods include starches, fast food, fats, breakfast meat, butter, pizza.    24-hr recall:  Grilled chicken and 2 pickles   Usual physical activity: obsessive cleaning.  Not right now as she hasn't been feeling well  TANITA  BODY COMP RESULTS  04/17/16 05/07/16 05/26/16 08/07/16 08/12/16   BMI (kg/m^2) 26.5 25.8 24.1 22.8 22.8   Fat Mass (lbs) 50.8 51.8 48.6 42.2 37.8   Fat Free Mass (lbs) 108.2 105.8 102.8 97 101.2   Total Body Water (lbs) 76.4 74.6 72.2 67.2 70.6  % body fat    30.3 27.2   Estimated energy intake: <500  Estimated energy needs: 2000 calories 250 g carbohydrates 100 g protein 67 g fat    Nutritional Diagnosis:  NB-1.5 Disordered eating pattern As related to meal skipping and binging .  As evidenced by dietary recall.    Intervention:  Nutrition counseling provided.  Challenged ED voice. Discussed severity of her illness.  Anorexia is not about weight; it's about behaviors.  She is very sick. If she continue to restrict to this level, she will need a higher level or care  Please eat 2 meals with kids and text lunch pic to [place] (bf) for accountability Read Declaration from Ed daily  Dairy: 2 Fruit: 2 veg: 2 Starch: 4 Pro: 3 Fat: 3    Monitoring/Evaluation:   Will follow up in 1 week

## 2016-08-14 ENCOUNTER — Telehealth (HOSPITAL_COMMUNITY): Payer: Self-pay | Admitting: *Deleted

## 2016-08-14 NOTE — Telephone Encounter (Signed)
Prior authorization for Temazepam received. Submitted online with cover my meds. Case ID [id number].

## 2016-08-15 NOTE — Telephone Encounter (Signed)
Called pt due to previous pharmacy needing office to do a PA on pt Temazepam. Per pt chart, request was submitted and waiting for pt insurance to respond. Staff called pt to inform her with this information and was unable to reach pt. lmtcb and office number was provided on voicemail and name of med and office waiting for insurance to respond and if pt have further questions to call office.

## 2016-08-15 NOTE — Telephone Encounter (Signed)
noted 

## 2016-08-18 ENCOUNTER — Ambulatory Visit (HOSPITAL_COMMUNITY): Payer: Self-pay

## 2016-08-25 ENCOUNTER — Encounter: Payer: Managed Care, Other (non HMO) | Admitting: *Deleted

## 2016-08-25 DIAGNOSIS — F509 Eating disorder, unspecified: Secondary | ICD-10-CM

## 2016-08-25 DIAGNOSIS — Z713 Dietary counseling and surveillance: Secondary | ICD-10-CM | POA: Diagnosis not present

## 2016-08-25 NOTE — Progress Notes (Signed)
  Medical Nutrition Therapy:  Appt start time: 0830  end time:  0915   Assessment:  Primary concerns today: Katelyn [name] is here for follow up nutrition counseling related to disordered eating Is getting divorced today.   Weight is stable.  Tanita is stable  Made a considerable effort to increase.  Tried to make "positive choices".  Didn't eat 6 times/day, but ate 4-5 time.  Eats 3 times on weekends, but more energy dense.  Has noticed her mood is better.  Is not as stressed.  Not as edgy.  Things are a little bit better. Food is still a struggles, but that is getting somewhat better.  Does ok when she eats "healthy stuff" and doesn't feel terrible about that.  She is able to eat more and is able to talk herself through eating more.  Skips meals sometimes.  Like today  Improved energy ; sleeping better Dizziness better Headaches sometimes No GI distress. Except on the weekends when she eats more energy dense foods Improving constipation.  BM every 2 days.  Not a strain  For a couple days she felt like she ate all day long.  That has slowed down  Sometimes eats breakfast with her kids.  They eat at different time Talk to Zeb at lunchtime Has been reading DOI Has not scheduled with [name] yet due to insurance change.   Can no longer follow up with this office due to insurance change   MEDICATIONS: see list   DIETARY INTAKE:  Fear/Avoided foods include starches, fast food, fats, breakfast meat, butter, pizza.    24-hr recall:  B: 1/2 egg mcmuffin L: 1 chicken strip, couple fries D: granola with yogurt    Usual physical activity: obsessive cleaning.  Not right now as she hasn't been feeling well  TANITA  BODY COMP RESULTS  04/17/16 05/07/16 05/26/16 08/07/16 08/12/16   BMI (kg/m^2) 26.5 25.8 24.1 22.8 22.8   Fat Mass (lbs) 50.8 51.8 48.6 42.2 37.8   Fat Free Mass (lbs) 108.2 105.8 102.8 97 101.2   Total Body Water (lbs) 76.4 74.6 72.2 67.2 70.6  % body fat    30.3 27.2    Estimated energy intake: [phone]  Estimated energy needs: 2000 calories 250 g carbohydrates 100 g protein 67 g fat    Nutritional Diagnosis:  NB-1.5 Disordered eating pattern As related to meal skipping and binging .  As evidenced by dietary recall.    Intervention:  Nutrition counseling provided.  Challenged ED voice. Praised progress.  Reiterated need to follow meal plan in its entirety  Stressed how food is fuel  Call [facility]wenn, call [facility]   Dairy: 2 Fruit: 2 veg: 2 Starch: 4 Pro: 3 Fat: 3    Monitoring/Evaluation:   Will follow up at [facility] for self pay

## 2016-08-25 NOTE — Patient Instructions (Signed)
Please eat 2 meals with kids and text lunch pic to [place] (bf) for accountability Read Declaration from Ed daily  Call [facility] to get scheduled Call [facility] to get scheduled  Meal plan Dairy: 2 Fruit: 2 veg: 2 Starch: 4 Pro: 3 Fat: 3

## 2016-09-09 DIAGNOSIS — R630 Anorexia: Secondary | ICD-10-CM | POA: Diagnosis not present

## 2016-09-09 DIAGNOSIS — F411 Generalized anxiety disorder: Secondary | ICD-10-CM | POA: Diagnosis not present

## 2016-09-09 DIAGNOSIS — F3112 Bipolar disorder, current episode manic without psychotic features, moderate: Secondary | ICD-10-CM | POA: Diagnosis not present

## 2016-09-17 ENCOUNTER — Ambulatory Visit (HOSPITAL_COMMUNITY): Payer: Self-pay

## 2016-09-17 ENCOUNTER — Ambulatory Visit (HOSPITAL_COMMUNITY): Payer: Self-pay | Admitting: Adult Health

## 2016-09-23 ENCOUNTER — Encounter (HOSPITAL_COMMUNITY): Payer: Self-pay | Admitting: Psychiatry

## 2016-09-23 ENCOUNTER — Ambulatory Visit (INDEPENDENT_AMBULATORY_CARE_PROVIDER_SITE_OTHER): Payer: PPO | Admitting: Psychiatry

## 2016-09-23 DIAGNOSIS — Z87891 Personal history of nicotine dependence: Secondary | ICD-10-CM | POA: Diagnosis not present

## 2016-09-23 DIAGNOSIS — F339 Major depressive disorder, recurrent, unspecified: Secondary | ICD-10-CM | POA: Diagnosis not present

## 2016-09-23 DIAGNOSIS — F509 Eating disorder, unspecified: Secondary | ICD-10-CM | POA: Diagnosis not present

## 2016-09-23 DIAGNOSIS — Z880 Allergy status to penicillin: Secondary | ICD-10-CM | POA: Diagnosis not present

## 2016-09-23 DIAGNOSIS — Z811 Family history of alcohol abuse and dependence: Secondary | ICD-10-CM

## 2016-09-23 DIAGNOSIS — F419 Anxiety disorder, unspecified: Secondary | ICD-10-CM | POA: Diagnosis not present

## 2016-09-23 DIAGNOSIS — Z79899 Other long term (current) drug therapy: Secondary | ICD-10-CM | POA: Diagnosis not present

## 2016-09-23 DIAGNOSIS — Z818 Family history of other mental and behavioral disorders: Secondary | ICD-10-CM

## 2016-09-23 DIAGNOSIS — Z888 Allergy status to other drugs, medicaments and biological substances status: Secondary | ICD-10-CM | POA: Diagnosis not present

## 2016-09-23 DIAGNOSIS — F331 Major depressive disorder, recurrent, moderate: Secondary | ICD-10-CM

## 2016-09-23 MED ORDER — BUPROPION HCL 75 MG PO TABS: ORAL_TABLET | ORAL | 2 refills | Status: DC

## 2016-09-23 MED ORDER — ZIPRASIDONE HCL 80 MG PO CAPS: 80.0000 mg | ORAL_CAPSULE | Freq: Every day | ORAL | 2 refills | Status: DC

## 2016-09-23 MED ORDER — FLUOXETINE HCL 20 MG PO TABS: 40.0000 mg | ORAL_TABLET | Freq: Every day | ORAL | 2 refills | Status: DC

## 2016-09-23 MED ORDER — TEMAZEPAM 22.5 MG PO CAPS: 22.5000 mg | ORAL_CAPSULE | Freq: Every evening | ORAL | 2 refills | Status: DC | PRN
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
## 2016-09-24 DIAGNOSIS — F3112 Bipolar disorder, current episode manic without psychotic features, moderate: Secondary | ICD-10-CM | POA: Diagnosis not present

## 2016-09-24 DIAGNOSIS — F411 Generalized anxiety disorder: Secondary | ICD-10-CM | POA: Diagnosis not present

## 2016-09-24 DIAGNOSIS — R630 Anorexia: Secondary | ICD-10-CM | POA: Diagnosis not present

## 2016-10-07 ENCOUNTER — Encounter (HOSPITAL_COMMUNITY): Payer: PPO | Attending: Adult Health | Admitting: Adult Health

## 2016-10-07 ENCOUNTER — Encounter (HOSPITAL_COMMUNITY): Payer: PPO

## 2016-10-07 ENCOUNTER — Encounter (HOSPITAL_COMMUNITY): Payer: Self-pay | Admitting: Adult Health

## 2016-10-07 VITALS — BP 109/68 | HR 71 | Temp 98.7°F | Resp 16 | Wt 131.4 lb

## 2016-10-07 DIAGNOSIS — Z9884 Bariatric surgery status: Secondary | ICD-10-CM | POA: Diagnosis not present

## 2016-10-07 DIAGNOSIS — E639 Nutritional deficiency, unspecified: Secondary | ICD-10-CM

## 2016-10-07 DIAGNOSIS — F319 Bipolar disorder, unspecified: Secondary | ICD-10-CM

## 2016-10-07 DIAGNOSIS — F509 Eating disorder, unspecified: Secondary | ICD-10-CM

## 2016-10-07 DIAGNOSIS — D509 Iron deficiency anemia, unspecified: Secondary | ICD-10-CM | POA: Diagnosis not present

## 2016-10-07 LAB — COMPREHENSIVE METABOLIC PANEL
ALBUMIN: 4.7 g/dL (ref 3.5–5.0)
ALT: 19 U/L (ref 14–54)
ANION GAP: 9 (ref 5–15)
AST: 18 U/L (ref 15–41)
Alkaline Phosphatase: 57 U/L (ref 38–126)
BILIRUBIN TOTAL: 0.8 mg/dL (ref 0.3–1.2)
BUN: 10 mg/dL (ref 6–20)
CALCIUM: 9.1 mg/dL (ref 8.9–10.3)
CO2: 25 mmol/L (ref 22–32)
Chloride: 106 mmol/L (ref 101–111)
Creatinine, Ser: 0.81 mg/dL (ref 0.44–1.00)
GFR calc Af Amer: >60 mL/min (ref >60)
GFR calc non Af Amer: >60 mL/min (ref >60)
GLUCOSE: 93 mg/dL (ref 65–99)
Potassium: 3.4 mmol/L — ABNORMAL LOW (ref 3.5–5.1)
SODIUM: 140 mmol/L (ref 135–145)
TOTAL PROTEIN: 7.1 g/dL (ref 6.5–8.1)

## 2016-10-07 LAB — CBC WITH DIFFERENTIAL/PLATELET
BASOS PCT: 0 %
Basophils Absolute: 0 10*3/uL (ref 0.0–0.1)
Eosinophils Absolute: 0.1 10*3/uL (ref 0.0–0.7)
Eosinophils Relative: 1 %
HEMATOCRIT: 41.9 % (ref 36.0–46.0)
Hemoglobin: 14.4 g/dL (ref 12.0–15.0)
LYMPHS ABS: 1.6 10*3/uL (ref 0.7–4.0)
Lymphocytes Relative: 25 %
MCH: 33.7 pg (ref 26.0–34.0)
MCHC: 34.4 g/dL (ref 30.0–36.0)
MCV: 98.1 fL (ref 78.0–100.0)
MONOS PCT: 6 %
Monocytes Absolute: 0.4 10*3/uL (ref 0.1–1.0)
NEUTROS ABS: 4.4 10*3/uL (ref 1.7–7.7)
NEUTROS PCT: 68 %
Platelets: 285 10*3/uL (ref 150–400)
RBC: 4.27 MIL/uL (ref 3.87–5.11)
RDW: 13 % (ref 11.5–15.5)
WBC: 6.5 10*3/uL (ref 4.0–10.5)

## 2016-10-07 LAB — IRON AND TIBC
Iron: 58 ug/dL (ref 28–170)
Saturation Ratios: 23 % (ref 10.4–31.8)
TIBC: 256 ug/dL (ref 250–450)
UIBC: 198 ug/dL

## 2016-10-07 LAB — FERRITIN: Ferritin: 163 ng/mL (ref 11–307)
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
## 2016-10-07 NOTE — Patient Instructions (Addendum)
[place] at [facility] Discharge Instructions  RECOMMENDATIONS MADE BY THE CONSULTANT AND ANY TEST RESULTS WILL BE SENT TO YOUR REFERRING PHYSICIAN.  You saw Mike Craze, NP, today Labs today.  Follow-Up in 4 months with labs.  See Amy at checkout for appointments.  Thank you for choosing [place] at [facility] to provide your oncology and hematology care.  To afford each patient quality time with our provider, please arrive at least 15 minutes before your scheduled appointment time.    If you have a lab appointment with the [facility] please come in thru the  Main Entrance and check in at the main information desk  You need to re-schedule your appointment should you arrive 10 or more minutes late.  We strive to give you quality time with our providers, and arriving late affects you and other patients whose appointments are after yours.  Also, if you no show three or more times for appointments you may be dismissed from the clinic at the providers discretion.     Again, thank you for choosing [facility].  Our hope is that these requests will decrease the amount of time that you wait before being seen by our physicians.       _____________________________________________________________  Should you have questions after your visit to [facility], please contact our office at [phone] between the hours of 8:30 a.m. and 4:30 p.m.  Voicemails left after 4:30 p.m. will not be returned until the following business day.  For prescription refill requests, have your pharmacy contact our office.       Resources For Cancer Patients and their Caregivers ? American Cancer Society: Can assist with transportation, wigs, general needs, runs Look Good Feel Better.        248-274-4940 ? Cancer Care: Provides financial assistance, online support groups, medication/co-pay assistance.  1-800-813-HOPE  979-571-6043) ? [place] Assists [place] cancer patients and their families through emotional , educational and financial support.  506 144 2841 ? [place] DSS Where to apply for food stamps, Medicaid and utility assistance. 775-812-7569 ? [facility]: Transportation to medical appointments. 952 232 3811 ? Social Security Administration: May apply for disability if have a Stage IV cancer. 254-243-6651 360-573-3634 ? [employer], Disability and Transit Services: Assists with nutrition, care and transit needs. [place] Support Programs: @10RELATIVEDAYS @ > Cancer Support Group  2nd Tuesday of the month 1pm-2pm, Journey Room  > Creative Journey  3rd Tuesday of the month 1130am-1pm, Journey Room  > Look Good Feel Better  1st Wednesday of the month 10am-12 noon, Journey Room (Call [place] to register [phone])

## 2016-10-08 DIAGNOSIS — F3112 Bipolar disorder, current episode manic without psychotic features, moderate: Secondary | ICD-10-CM | POA: Diagnosis not present

## 2016-10-08 DIAGNOSIS — R630 Anorexia: Secondary | ICD-10-CM | POA: Diagnosis not present

## 2016-10-08 DIAGNOSIS — F411 Generalized anxiety disorder: Secondary | ICD-10-CM | POA: Diagnosis not present

## 2016-11-05 ENCOUNTER — Other Ambulatory Visit: Payer: Self-pay

## 2016-11-05 NOTE — Patient Outreach (Signed)
[employer] questionnaire screening call: 11/04/2016  Placed call to patient with health team advantage provided phone number. No answer. 11/05/2016  Placed 2nd call to patient with home number in epic with no answer. 11/05/2016  Placed 3rd call to patient with mobile number in Epic and was able to reach patient who identified herself.  Patient reports that she is doing well. Reports numerous MD visit for normal medical "stuff".  Reports that she is taking all her medications without problems.  Reports that she stopped smoking in 1998.    Declines any needs at this time. Offered to send letter and magnet and patient agreed. Confirmed address.  No further needs at this time. Tomasa Rand, RN, BSN, CEN [facility] [employer] [phone]

## 2016-11-06 DIAGNOSIS — F3112 Bipolar disorder, current episode manic without psychotic features, moderate: Secondary | ICD-10-CM | POA: Diagnosis not present

## 2016-11-06 DIAGNOSIS — F411 Generalized anxiety disorder: Secondary | ICD-10-CM | POA: Diagnosis not present

## 2016-11-06 DIAGNOSIS — R630 Anorexia: Secondary | ICD-10-CM | POA: Diagnosis not present

## 2016-11-20 DIAGNOSIS — R300 Dysuria: Secondary | ICD-10-CM | POA: Diagnosis not present

## 2016-11-20 DIAGNOSIS — B373 Candidiasis of vulva and vagina: Secondary | ICD-10-CM | POA: Diagnosis not present

## 2016-12-01 ENCOUNTER — Telehealth: Payer: Self-pay | Admitting: Family Medicine

## 2016-12-01 NOTE — Telephone Encounter (Signed)
Patient calling to get a medical clearance appt with you so she can have plastic surgery with Dr. Charma Igo in [place].  Since her appt is 12/03/16 @1 :40 she is wanting to make sure that you receive the blood work from Dr Excell Seltzer w/[facility].  Please let know if this is here or not.

## 2016-12-02 NOTE — Telephone Encounter (Signed)
Patient has a copy of the labs and will bring to her appt tomorrow

## 2016-12-03 ENCOUNTER — Ambulatory Visit (INDEPENDENT_AMBULATORY_CARE_PROVIDER_SITE_OTHER): Payer: PPO | Admitting: Family Medicine

## 2016-12-03 ENCOUNTER — Encounter: Payer: Self-pay | Admitting: Family Medicine

## 2016-12-03 VITALS — BP 106/60 | HR 98 | Temp 97.7°F | Resp 16 | Ht 65.5 in | Wt 123.8 lb

## 2016-12-03 DIAGNOSIS — F331 Major depressive disorder, recurrent, moderate: Secondary | ICD-10-CM

## 2016-12-03 DIAGNOSIS — F509 Eating disorder, unspecified: Secondary | ICD-10-CM | POA: Diagnosis not present

## 2016-12-03 DIAGNOSIS — Z01818 Encounter for other preprocedural examination: Secondary | ICD-10-CM | POA: Diagnosis not present

## 2016-12-03 DIAGNOSIS — R519 Headache, unspecified: Secondary | ICD-10-CM

## 2016-12-03 DIAGNOSIS — R51 Headache: Secondary | ICD-10-CM

## 2016-12-03 NOTE — Progress Notes (Signed)
   Katelyn [name]     MRN: [id number]      DOB: [birth year]   HPI Katelyn [name] is here for follow up and re-evaluation of chronic medical conditions, medication management and review of any available recent lab and radiology data.  Preventive health is updated, specifically  Cancer screening and Immunization.   Questions or concerns regarding consultations or procedures which the PT has had in the interim are  Addressed. She needs to get pre op evaluation and clearance for proposed upcoming plastic surgery for breast augmentation and removal of excess skin from lower abdomen, currently uncertain of which surgeon she will use, has seen one and has an appointment with a 2nd  No longer being followed by neurology or endocrinology, she has her ntire recent panel of labs from her bariatric surgeon and they are all excellent including CBC, liver , kidney and thyroid function, vit d, HBA1C, and Vit B levels  Requests that I write her meds for headaches, gets  On avg 2 to 3 headaches  per month, and needs to use approx 3 tabs each of flexeril  And zofran to abate the headache  ROS Denies recent fever or chills. Denies sinus pressure, nasal congestion, ear pain or sore throat. Denies chest congestion, productive cough or wheezing. Denies chest pains, palpitations and leg swelling Denies abdominal pain, nausea, vomiting,diarrhea or constipation.   Denies dysuria, frequency, hesitancy or incontinence. Denies joint pain, swelling and limitation in mobility. Denies uncontrolled headaches, seizures, numbness, or tingling. Denies uncontrolled  depression, anxiety or insomnia. Denies skin break down or rash.   PE  BP 106/60 (BP Location: Left Arm, Patient Position: Sitting, Cuff Size: Normal)   Pulse 98   Temp 97.7 F (36.5 C) (Other (Comment))   Resp 16   Ht 5' 5.5" (1.664 m)   Wt 123 lb 12 oz (56.1 kg)   LMP 11/25/2016 (Exact Date)   SpO2 98%   BMI 20.28 kg/m   Patient alert and oriented and  in no cardiopulmonary distress.  HEENT: No facial asymmetry, EOMI,   oropharynx pink and moist.  Neck supple no JVD, no mass.  Chest: Clear to auscultation bilaterally.  CVS: S1, S2 no murmurs, no S3.Regular rate. EKG; NSR, no ischemia, No LVH  ABD: Soft non tender.  UA: normal Ext: No edema  MS: Adequate ROM spine, shoulders, hips and knees.  Skin: Intact, no ulcerations or rash noted.  Psych: Good eye contact, normal affect. Memory intact not anxious or depressed appearing.  CNS: CN 2-12 intact, power,  normal throughout.no focal deficits noted.   Assessment & Plan  Preop exam for internal medicine Pt is medically cleared based on history , and exam for proposed plastic surgery. Her urinalysis is normal and her eKG is normal, cXR is ordered and pending, lungs are clear   Eating disorder Controlled and stable, following intensive therapy , she remains in therapy and is doing extremely well  Chronic headaches Controlled on same regime of medication for over 2 years, I will tak over prescribing as pt will no longer see neurology unless she develops breakthrough headaches  Iron deficiency anemia corrected  Major depressive disorder, recurrent episode, moderate (HCC) Stable, doing well, treated by psychiatry

## 2016-12-04 DIAGNOSIS — R630 Anorexia: Secondary | ICD-10-CM | POA: Diagnosis not present

## 2016-12-04 DIAGNOSIS — F411 Generalized anxiety disorder: Secondary | ICD-10-CM | POA: Diagnosis not present

## 2016-12-04 DIAGNOSIS — F3112 Bipolar disorder, current episode manic without psychotic features, moderate: Secondary | ICD-10-CM | POA: Diagnosis not present

## 2016-12-08 ENCOUNTER — Telehealth: Payer: Self-pay | Admitting: Family Medicine

## 2016-12-08 ENCOUNTER — Other Ambulatory Visit: Payer: Self-pay | Admitting: Family Medicine

## 2016-12-08 DIAGNOSIS — Z01818 Encounter for other preprocedural examination: Secondary | ICD-10-CM | POA: Insufficient documentation

## 2016-12-08 DIAGNOSIS — Z01811 Encounter for preprocedural respiratory examination: Secondary | ICD-10-CM

## 2016-12-08 DIAGNOSIS — Z09 Encounter for follow-up examination after completed treatment for conditions other than malignant neoplasm: Secondary | ICD-10-CM | POA: Insufficient documentation

## 2016-12-08 LAB — POCT URINALYSIS DIPSTICK
BILIRUBIN UA: NEGATIVE
Glucose, UA: NEGATIVE
KETONES UA: NEGATIVE
Leukocytes, UA: NEGATIVE
NITRITE UA: NEGATIVE
PH UA: 6 (ref 5.0–8.0)
Protein, UA: NEGATIVE
RBC UA: NEGATIVE
SPEC GRAV UA: 1.025 (ref 1.010–1.025)
Urobilinogen, UA: 0.2 E.U./dL

## 2016-12-08 MED ORDER — TOPIRAMATE 100 MG PO TABS: 100.0000 mg | ORAL_TABLET | Freq: Two times a day (BID) | ORAL | 5 refills | Status: DC

## 2016-12-08 MED ORDER — ONDANSETRON HCL 4 MG PO TABS: ORAL_TABLET | ORAL | 5 refills | Status: DC

## 2016-12-08 MED ORDER — CYCLOBENZAPRINE HCL 5 MG PO TABS: ORAL_TABLET | ORAL | 5 refills | Status: DC

## 2016-12-08 NOTE — Assessment & Plan Note (Signed)
Pt is medically cleared based on history , and exam for proposed plastic surgery. Her urinalysis is normal and her eKG is normal, cXR is ordered and pending, lungs are clear

## 2016-12-08 NOTE — Assessment & Plan Note (Signed)
Controlled on same regime of medication for over 2 years, I will tak over prescribing as pt will no longer see neurology unless she develops breakthrough headaches

## 2016-12-08 NOTE — Assessment & Plan Note (Signed)
Controlled and stable, following intensive therapy , she remains in therapy and is doing extremely well

## 2016-12-08 NOTE — Addendum Note (Signed)
Addended by: Eual Fines on: 12/08/2016 01:21 PM   Modules accepted: Orders

## 2016-12-08 NOTE — Telephone Encounter (Signed)
Patient called nurse line. She states she was in the office the other day when the computers went down. She states that Dr. Moshe Cipro was supposed to order a chest x-ray and refill zofran, topamax, and flexeril to [facility].

## 2016-12-08 NOTE — Patient Instructions (Signed)
F/u in 6 months, call if you need me sooner   Thankful that you are doing extremely well.  Your history and exam are excellent  your EKG is normal and your urinalysis is also normal  Please get a chest X ray  Recent labs are excellent  I will prescribe medications for headache management as we discussed moving forward , which are zofran, flexeril and topamax  Thank you  for choosing [facility]. We consider it a privelige to serve you.  Delivering excellent health care in a caring and  compassionate way is our goal.  Partnering with you,  so that together we can achieve this goal is our strategy.

## 2016-12-08 NOTE — Assessment & Plan Note (Signed)
corrected

## 2016-12-08 NOTE — Assessment & Plan Note (Signed)
Stable, doing well, treated by psychiatry

## 2016-12-08 NOTE — Telephone Encounter (Signed)
cxr ordered I will send in meds

## 2016-12-09 ENCOUNTER — Encounter: Payer: Self-pay | Admitting: Family Medicine

## 2016-12-09 ENCOUNTER — Ambulatory Visit (HOSPITAL_COMMUNITY)
Admission: RE | Admit: 2016-12-09 | Discharge: 2016-12-09 | Disposition: A | Discharge status: Home / Self Care | Payer: PPO | Source: Ambulatory Visit | Attending: Family Medicine | Admitting: Family Medicine

## 2016-12-09 DIAGNOSIS — Z01811 Encounter for preprocedural respiratory examination: Secondary | ICD-10-CM | POA: Diagnosis not present

## 2016-12-09 DIAGNOSIS — R05 Cough: Secondary | ICD-10-CM | POA: Insufficient documentation

## 2016-12-17 DIAGNOSIS — B373 Candidiasis of vulva and vagina: Secondary | ICD-10-CM | POA: Diagnosis not present

## 2016-12-17 DIAGNOSIS — Z124 Encounter for screening for malignant neoplasm of cervix: Secondary | ICD-10-CM | POA: Diagnosis not present

## 2016-12-17 DIAGNOSIS — Z1159 Encounter for screening for other viral diseases: Secondary | ICD-10-CM | POA: Diagnosis not present

## 2016-12-17 DIAGNOSIS — N952 Postmenopausal atrophic vaginitis: Secondary | ICD-10-CM | POA: Diagnosis not present

## 2016-12-17 DIAGNOSIS — Z118 Encounter for screening for other infectious and parasitic diseases: Secondary | ICD-10-CM | POA: Diagnosis not present

## 2016-12-17 DIAGNOSIS — Z113 Encounter for screening for infections with a predominantly sexual mode of transmission: Secondary | ICD-10-CM | POA: Diagnosis not present

## 2016-12-17 DIAGNOSIS — Z114 Encounter for screening for human immunodeficiency virus [HIV]: Secondary | ICD-10-CM | POA: Diagnosis not present

## 2016-12-24 ENCOUNTER — Encounter (HOSPITAL_COMMUNITY): Payer: Self-pay | Admitting: Psychiatry

## 2016-12-24 ENCOUNTER — Ambulatory Visit (INDEPENDENT_AMBULATORY_CARE_PROVIDER_SITE_OTHER): Payer: PPO | Admitting: Psychiatry

## 2016-12-24 VITALS — BP 116/68 | HR 97 | Ht 65.5 in | Wt 122.4 lb

## 2016-12-24 DIAGNOSIS — R634 Abnormal weight loss: Secondary | ICD-10-CM

## 2016-12-24 DIAGNOSIS — Z818 Family history of other mental and behavioral disorders: Secondary | ICD-10-CM

## 2016-12-24 DIAGNOSIS — Z560 Unemployment, unspecified: Secondary | ICD-10-CM

## 2016-12-24 DIAGNOSIS — Z87891 Personal history of nicotine dependence: Secondary | ICD-10-CM

## 2016-12-24 DIAGNOSIS — F419 Anxiety disorder, unspecified: Secondary | ICD-10-CM

## 2016-12-24 DIAGNOSIS — F331 Major depressive disorder, recurrent, moderate: Secondary | ICD-10-CM

## 2016-12-24 DIAGNOSIS — Z811 Family history of alcohol abuse and dependence: Secondary | ICD-10-CM

## 2016-12-24 DIAGNOSIS — Z6379 Other stressful life events affecting family and household: Secondary | ICD-10-CM | POA: Diagnosis not present

## 2016-12-24 DIAGNOSIS — R51 Headache: Secondary | ICD-10-CM | POA: Diagnosis not present

## 2016-12-24 DIAGNOSIS — D509 Iron deficiency anemia, unspecified: Secondary | ICD-10-CM | POA: Diagnosis not present

## 2016-12-24 MED ORDER — BUPROPION HCL 75 MG PO TABS: ORAL_TABLET | ORAL | 1 refills | Status: DC

## 2016-12-24 MED ORDER — FLUOXETINE HCL 20 MG PO TABS: 20.0000 mg | ORAL_TABLET | Freq: Every day | ORAL | 1 refills | Status: DC

## 2016-12-24 MED ORDER — ZIPRASIDONE HCL 80 MG PO CAPS: 80.0000 mg | ORAL_CAPSULE | Freq: Every day | ORAL | 1 refills | Status: DC

## 2016-12-24 MED ORDER — TEMAZEPAM 22.5 MG PO CAPS: 22.5000 mg | ORAL_CAPSULE | Freq: Every evening | ORAL | 1 refills | Status: DC | PRN
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
## 2017-01-01 DIAGNOSIS — F411 Generalized anxiety disorder: Secondary | ICD-10-CM | POA: Diagnosis not present

## 2017-01-01 DIAGNOSIS — R630 Anorexia: Secondary | ICD-10-CM | POA: Diagnosis not present

## 2017-01-01 DIAGNOSIS — F3112 Bipolar disorder, current episode manic without psychotic features, moderate: Secondary | ICD-10-CM | POA: Diagnosis not present

## 2017-01-05 ENCOUNTER — Encounter: Payer: Self-pay | Admitting: Family Medicine

## 2017-01-22 DIAGNOSIS — R630 Anorexia: Secondary | ICD-10-CM | POA: Diagnosis not present

## 2017-01-22 DIAGNOSIS — F411 Generalized anxiety disorder: Secondary | ICD-10-CM | POA: Diagnosis not present

## 2017-01-22 DIAGNOSIS — F3112 Bipolar disorder, current episode manic without psychotic features, moderate: Secondary | ICD-10-CM | POA: Diagnosis not present

## 2017-01-28 ENCOUNTER — Encounter (HOSPITAL_COMMUNITY): Payer: PPO | Attending: Oncology

## 2017-01-28 DIAGNOSIS — Z79899 Other long term (current) drug therapy: Secondary | ICD-10-CM | POA: Insufficient documentation

## 2017-01-28 DIAGNOSIS — F329 Major depressive disorder, single episode, unspecified: Secondary | ICD-10-CM | POA: Insufficient documentation

## 2017-01-28 DIAGNOSIS — Z9884 Bariatric surgery status: Secondary | ICD-10-CM | POA: Diagnosis not present

## 2017-01-28 DIAGNOSIS — Z880 Allergy status to penicillin: Secondary | ICD-10-CM | POA: Diagnosis not present

## 2017-01-28 DIAGNOSIS — Z87891 Personal history of nicotine dependence: Secondary | ICD-10-CM | POA: Insufficient documentation

## 2017-01-28 DIAGNOSIS — E282 Polycystic ovarian syndrome: Secondary | ICD-10-CM | POA: Diagnosis not present

## 2017-01-28 DIAGNOSIS — F419 Anxiety disorder, unspecified: Secondary | ICD-10-CM | POA: Diagnosis not present

## 2017-01-28 DIAGNOSIS — G4730 Sleep apnea, unspecified: Secondary | ICD-10-CM | POA: Diagnosis not present

## 2017-01-28 DIAGNOSIS — D509 Iron deficiency anemia, unspecified: Secondary | ICD-10-CM | POA: Diagnosis not present

## 2017-01-28 LAB — CBC WITH DIFFERENTIAL/PLATELET
BASOS ABS: 0 10*3/uL (ref 0.0–0.1)
BASOS PCT: 0 %
EOS ABS: 0.1 10*3/uL (ref 0.0–0.7)
EOS PCT: 1 %
HCT: 43 % (ref 36.0–46.0)
Hemoglobin: 14.5 g/dL (ref 12.0–15.0)
LYMPHS ABS: 2.2 10*3/uL (ref 0.7–4.0)
LYMPHS PCT: 27 %
MCH: 33.5 pg (ref 26.0–34.0)
MCHC: 33.7 g/dL (ref 30.0–36.0)
MCV: 99.3 fL (ref 78.0–100.0)
Monocytes Absolute: 0.4 10*3/uL (ref 0.1–1.0)
Monocytes Relative: 5 %
NEUTROS ABS: 5.4 10*3/uL (ref 1.7–7.7)
NEUTROS PCT: 67 %
PLATELETS: 262 10*3/uL (ref 150–400)
RBC: 4.33 MIL/uL (ref 3.87–5.11)
RDW: 12.7 % (ref 11.5–15.5)
WBC: 8.1 10*3/uL (ref 4.0–10.5)

## 2017-01-28 LAB — COMPREHENSIVE METABOLIC PANEL
ALT: 20 U/L (ref 14–54)
ANION GAP: 7 (ref 5–15)
AST: 16 U/L (ref 15–41)
Albumin: 4.7 g/dL (ref 3.5–5.0)
Alkaline Phosphatase: 66 U/L (ref 38–126)
BUN: 12 mg/dL (ref 6–20)
CHLORIDE: 106 mmol/L (ref 101–111)
CO2: 26 mmol/L (ref 22–32)
Calcium: 9.1 mg/dL (ref 8.9–10.3)
Creatinine, Ser: 0.8 mg/dL (ref 0.44–1.00)
Glucose, Bld: 99 mg/dL (ref 65–99)
POTASSIUM: 3.9 mmol/L (ref 3.5–5.1)
Sodium: 139 mmol/L (ref 135–145)
TOTAL PROTEIN: 6.9 g/dL (ref 6.5–8.1)
Total Bilirubin: 0.6 mg/dL (ref 0.3–1.2)

## 2017-01-28 LAB — IRON AND TIBC
IRON: 63 ug/dL (ref 28–170)
Saturation Ratios: 24 % (ref 10.4–31.8)
TIBC: 259 ug/dL (ref 250–450)
UIBC: 196 ug/dL

## 2017-01-28 LAB — FERRITIN: Ferritin: 93 ng/mL (ref 11–307)

## 2017-02-05 ENCOUNTER — Encounter (HOSPITAL_COMMUNITY): Payer: Self-pay | Admitting: Oncology

## 2017-02-05 ENCOUNTER — Encounter (HOSPITAL_BASED_OUTPATIENT_CLINIC_OR_DEPARTMENT_OTHER): Payer: PPO | Admitting: Oncology

## 2017-02-05 ENCOUNTER — Other Ambulatory Visit (HOSPITAL_COMMUNITY): Payer: Self-pay

## 2017-02-05 VITALS — BP 103/47 | HR 83 | Temp 98.2°F | Resp 18 | Wt 122.6 lb

## 2017-02-05 DIAGNOSIS — Z9884 Bariatric surgery status: Secondary | ICD-10-CM | POA: Diagnosis not present

## 2017-02-05 DIAGNOSIS — E538 Deficiency of other specified B group vitamins: Secondary | ICD-10-CM | POA: Diagnosis not present

## 2017-02-05 DIAGNOSIS — G479 Sleep disorder, unspecified: Secondary | ICD-10-CM | POA: Diagnosis not present

## 2017-02-05 DIAGNOSIS — D508 Other iron deficiency anemias: Secondary | ICD-10-CM | POA: Diagnosis not present

## 2017-02-05 DIAGNOSIS — R197 Diarrhea, unspecified: Secondary | ICD-10-CM | POA: Diagnosis not present
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
## 2017-02-05 NOTE — Patient Instructions (Signed)
[facility]  Discharge Instructions:  You were seen by Dr. Zhou today. _______________________________________________________________  Thank you for choosing [facility] at [facility] to provide your oncology and hematology care.  To afford each patient quality time with our providers, please arrive at least 15 minutes before your scheduled appointment.  You need to re-schedule your appointment if you arrive 10 or more minutes late.  We strive to give you quality time with our providers, and arriving late affects you and other patients whose appointments are after yours.  Also, if you no show three or more times for appointments you may be dismissed from the clinic.  Again, thank you for choosing [facility] at [facility]. Our hope is that these requests will allow you access to exceptional care and in a timely manner. _______________________________________________________________  If you have questions after your visit, please contact our office at [phone] between the hours of 8:30 a.m. and 5:00 p.m. Voicemails left after 4:30 p.m. will not be returned until the following business day. _______________________________________________________________  For prescription refill requests, have your pharmacy contact our office. _______________________________________________________________  Recommendations made by the consultant and any test results will be sent to your referring physician. _______________________________________________________________ 

## 2017-02-11 HISTORY — PX: BREAST ENHANCEMENT SURGERY: SHX7

## 2017-02-18 ENCOUNTER — Telehealth: Payer: Self-pay | Admitting: Family Medicine

## 2017-02-18 NOTE — Telephone Encounter (Signed)
Patient had breast implants put in 02/11/17, she is experiencing calf pain (ache) in left calf, numb and tingly if she is walking or sitting a certain way. No redness nor swelling. She would like to speak to someone to rule out a blood clot. Cb#: [phone]

## 2017-02-18 NOTE — Telephone Encounter (Signed)
Spoke to patient. Informed her that without redness, welling, tenderness or heat to the the touch, I did not think it was a blood clot. She is advised to go to ED if any of the above develop.

## 2017-02-23 ENCOUNTER — Encounter (HOSPITAL_COMMUNITY): Payer: Self-pay | Admitting: Psychiatry

## 2017-02-23 ENCOUNTER — Telehealth: Payer: Self-pay | Admitting: Family Medicine

## 2017-02-23 ENCOUNTER — Ambulatory Visit (INDEPENDENT_AMBULATORY_CARE_PROVIDER_SITE_OTHER): Payer: PPO | Admitting: Psychiatry

## 2017-02-23 DIAGNOSIS — R450 Nervousness: Secondary | ICD-10-CM | POA: Diagnosis not present

## 2017-02-23 DIAGNOSIS — Z818 Family history of other mental and behavioral disorders: Secondary | ICD-10-CM

## 2017-02-23 DIAGNOSIS — Z560 Unemployment, unspecified: Secondary | ICD-10-CM

## 2017-02-23 DIAGNOSIS — Z9882 Breast implant status: Secondary | ICD-10-CM

## 2017-02-23 DIAGNOSIS — Z811 Family history of alcohol abuse and dependence: Secondary | ICD-10-CM | POA: Diagnosis not present

## 2017-02-23 DIAGNOSIS — F419 Anxiety disorder, unspecified: Secondary | ICD-10-CM | POA: Diagnosis not present

## 2017-02-23 DIAGNOSIS — F331 Major depressive disorder, recurrent, moderate: Secondary | ICD-10-CM

## 2017-02-23 DIAGNOSIS — F509 Eating disorder, unspecified: Secondary | ICD-10-CM | POA: Diagnosis not present

## 2017-02-23 DIAGNOSIS — Z87891 Personal history of nicotine dependence: Secondary | ICD-10-CM

## 2017-02-23 MED ORDER — ZIPRASIDONE HCL 80 MG PO CAPS: 80.0000 mg | ORAL_CAPSULE | Freq: Every day | ORAL | 1 refills | Status: DC

## 2017-02-23 MED ORDER — FLUOXETINE HCL 20 MG PO TABS: 20.0000 mg | ORAL_TABLET | Freq: Every day | ORAL | 1 refills | Status: DC

## 2017-02-23 MED ORDER — BUPROPION HCL 75 MG PO TABS: ORAL_TABLET | ORAL | 1 refills | Status: DC

## 2017-02-23 MED ORDER — TEMAZEPAM 30 MG PO CAPS: 30.0000 mg | ORAL_CAPSULE | Freq: Every evening | ORAL | 1 refills | Status: DC | PRN
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
## 2017-02-23 NOTE — Telephone Encounter (Signed)
Patient had breast implant surgery 02/11/17, she is now having some complications , burning in the back of her legs, bed sores, hard time eating, and more. Dr.Barber is her Psychologist, sport and exercise and he is at a loss for what is going on he is requesting she see DrSimpson because she knows her better. Cb#: [phone]

## 2017-02-24 ENCOUNTER — Ambulatory Visit (INDEPENDENT_AMBULATORY_CARE_PROVIDER_SITE_OTHER): Payer: PPO | Admitting: Family Medicine

## 2017-02-24 VITALS — BP 120/60 | HR 121 | Temp 98.0°F | Resp 16 | Ht 65.5 in | Wt 128.0 lb

## 2017-02-24 DIAGNOSIS — F411 Generalized anxiety disorder: Secondary | ICD-10-CM

## 2017-02-24 DIAGNOSIS — F333 Major depressive disorder, recurrent, severe with psychotic symptoms: Secondary | ICD-10-CM

## 2017-02-24 DIAGNOSIS — M533 Sacrococcygeal disorders, not elsewhere classified: Secondary | ICD-10-CM | POA: Diagnosis not present

## 2017-02-24 DIAGNOSIS — F509 Eating disorder, unspecified: Secondary | ICD-10-CM | POA: Diagnosis not present

## 2017-02-24 MED ORDER — GABAPENTIN 100 MG PO CAPS: ORAL_CAPSULE | ORAL | 0 refills | Status: DC

## 2017-02-24 NOTE — Telephone Encounter (Signed)
Work in today at [employer] am

## 2017-02-24 NOTE — Patient Instructions (Addendum)
F/u in December, call if you need me sooner  Start gabapentin one daily or  twice daily as needed for pain  Please schedule sooner appt with nutrition  Skin is totally healthy  Change positions regularly for comfort as best able   Keep appts with providers who are all working together to help you

## 2017-02-24 NOTE — Progress Notes (Signed)
   SCOTTY PINDER     MRN: [id number]      DOB: [birth year]   HPI [name] is here ffollowing breast augmnetation surgery on 10/17, c/o excess tailbone pain, has been concerned she is getting bedsores allover which she had in the past. Very anxious, now also c/o weakness in legs, states she has been standing for long periods of time to prevent bedsores now legs are weak  No position of comfort. Breast surgeon saw her this past Sunday, advised her to lie on stomach , pt at with end , anxious,  C/o pain etc, concerned re  anorexia and nutritional status feels that she was  Not healthy enough for breast augmentation surgery when she had it and now her healing is a problem, she was anorectic, now assures me she is eating everything possible to reverse this Surgeon also suppoesed to be doing operation on arms and abdomen but this is ion hold until weight is gained, states she has been eating excessively but is hesitant to have further surgery also which I agree with Refuses flu vaccine today , states she has been advised to hold off on this by her surgeon  ROS Denies recent fever or chills. Denies sinus pressure, nasal congestion, ear pain or sore throat. Denies chest congestion, productive cough or wheezing. Denies chest pains, palpitations and leg swelling Denies abdominal pain, nausea, vomiting,diarrhea or constipation.   Denies dysuria, frequency, hesitancy or incontinence. Denies joint pain, swelling and limitation in mobility. Denies headaches, seizures, numbness, or tingling. Denies depression, anxiety or insomnia. Denies skin break down or rash.   PE  BP 120/60 (BP Location: Left Arm, Patient Position: Sitting, Cuff Size: Normal)   Pulse (!) 121   Temp 98 F (36.7 C) (Other (Comment))   Resp 16   Ht 5' 5.5" (1.664 m)   Wt 128 lb (58.1 kg)   SpO2 98%   BMI 20.98 kg/m   Patient alert and oriented and in no cardiopulmonary distress. Anxious  HEENT: No facial asymmetry, EOMI,    oropharynx pink and moist.  Neck supple no JVD, no mass.  Chest: Clear to auscultation bilaterally. Breasts: surgical margins show no signs of infection or skin breakdown, no purulent drainage , healing well CVS: S1, S2 no murmurs, no S3.Regular rate.  ABD: Soft non tender.   Ext: No edema  MS: Adequate ROM spine, shoulders, hips and knees.  Skin: Intact, no ulcerations or rash noted.No evidence of skin breakdown or erythema over tailbone , no ulcer  Psych: Good eye contact, normal affect. Memory intact not anxious or depressed appearing.  CNS: CN 2-12 intact, power,  normal throughout.no focal deficits noted.   Assessment & Plan  Generalized anxiety disorder Increased and uncontrolled following breast augmentation surgery, sees therapist regularly and was evaluated by her psychiatrist 1 day ago  Eating disorder Reports recent bout of anorexia immediately preceding her breast surgery, now reports overeating to gain weight , her BMI is healthy currently, needs nutrition appt asap  Depression, major, recurrent, severe with psychosis Treated by psychiatry and followed closely, not currently suicidal or homicidal, not psychotic, extremely anxious    Sacral pain Uncontrolled , start low dose gabapentin ,  May increase to max of 200 mg daily for 1 month only Will order X ray if symptoms persist

## 2017-02-25 ENCOUNTER — Ambulatory Visit: Payer: Self-pay | Admitting: Family Medicine

## 2017-03-01 ENCOUNTER — Encounter: Payer: Self-pay | Admitting: Family Medicine

## 2017-03-01 DIAGNOSIS — M533 Sacrococcygeal disorders, not elsewhere classified: Secondary | ICD-10-CM | POA: Insufficient documentation

## 2017-03-01 NOTE — Assessment & Plan Note (Signed)
Uncontrolled , start low dose gabapentin ,  May increase to max of 200 mg daily for 1 month only Will order X ray if symptoms persist

## 2017-03-01 NOTE — Assessment & Plan Note (Addendum)
Reports recent bout of anorexia immediately preceding her breast surgery, now reports overeating to gain weight , her BMI is healthy currently, needs nutrition appt asap

## 2017-03-01 NOTE — Assessment & Plan Note (Signed)
Increased and uncontrolled following breast augmentation surgery, sees therapist regularly and was evaluated by her psychiatrist 1 day ago

## 2017-03-01 NOTE — Assessment & Plan Note (Signed)
Treated by psychiatry and followed closely, not currently suicidal or homicidal, not psychotic, extremely anxious

## 2017-03-02 ENCOUNTER — Telehealth: Payer: Self-pay | Admitting: Family Medicine

## 2017-03-02 ENCOUNTER — Other Ambulatory Visit: Payer: Self-pay | Admitting: Family Medicine

## 2017-03-02 ENCOUNTER — Other Ambulatory Visit: Payer: Self-pay

## 2017-03-02 DIAGNOSIS — M545 Low back pain, unspecified: Secondary | ICD-10-CM

## 2017-03-02 DIAGNOSIS — M533 Sacrococcygeal disorders, not elsewhere classified: Secondary | ICD-10-CM

## 2017-03-02 MED ORDER — GABAPENTIN 100 MG PO CAPS
100.0000 mg | ORAL_CAPSULE | Freq: Three times a day (TID) | ORAL | 0 refills | Status: DC
Start: 2017-03-02 — End: 2017-03-02

## 2017-03-02 MED ORDER — GABAPENTIN 100 MG PO CAPS: 100.0000 mg | ORAL_CAPSULE | Freq: Three times a day (TID) | ORAL | 0 refills | Status: DC

## 2017-03-02 NOTE — Telephone Encounter (Signed)
Increase gabapentin dose to one 3 times daily also I am ordering the X ray of low back that we discussed but was not entered , ask her to get this  Done please

## 2017-03-02 NOTE — Telephone Encounter (Signed)
Patient left message on nurse line stating that gabapentin is not helping- she is no better, having no relief. She asks if dose can be increased or med changed?  [phone]

## 2017-03-02 NOTE — Telephone Encounter (Signed)
Patient aware.

## 2017-03-03 ENCOUNTER — Ambulatory Visit (HOSPITAL_COMMUNITY)
Admission: RE | Admit: 2017-03-03 | Discharge: 2017-03-03 | Disposition: A | Discharge status: Home / Self Care | Payer: PPO | Source: Ambulatory Visit | Attending: Family Medicine | Admitting: Family Medicine

## 2017-03-03 ENCOUNTER — Encounter: Payer: Self-pay | Admitting: Family Medicine

## 2017-03-03 DIAGNOSIS — M533 Sacrococcygeal disorders, not elsewhere classified: Secondary | ICD-10-CM | POA: Insufficient documentation

## 2017-03-03 DIAGNOSIS — M545 Low back pain, unspecified: Secondary | ICD-10-CM

## 2017-03-04 ENCOUNTER — Telehealth: Payer: Self-pay | Admitting: Family Medicine

## 2017-03-04 NOTE — Telephone Encounter (Signed)
Patient aware.

## 2017-03-04 NOTE — Telephone Encounter (Signed)
Patient left message on nurse line asking about results of x-ray done yesterday. Callback# [phone]

## 2017-03-06 ENCOUNTER — Telehealth: Payer: Self-pay | Admitting: Family Medicine

## 2017-03-06 NOTE — Telephone Encounter (Signed)
Patient is requesting to increase her gabapentin, states she is not doing better. She scheduled a followup for 03/12/17  Cb#[phone]

## 2017-03-10 NOTE — Telephone Encounter (Signed)
States a bit better, states surgeon mentioned possible fibromyalgia, describes generalized burning pain, wii address at visit

## 2017-03-12 ENCOUNTER — Encounter: Payer: Self-pay | Admitting: Family Medicine

## 2017-03-12 ENCOUNTER — Ambulatory Visit: Payer: PPO | Admitting: Family Medicine

## 2017-03-12 VITALS — BP 108/70 | HR 106 | Resp 16 | Ht 66.0 in | Wt 128.0 lb

## 2017-03-12 DIAGNOSIS — M79604 Pain in right leg: Secondary | ICD-10-CM | POA: Diagnosis not present

## 2017-03-12 DIAGNOSIS — F331 Major depressive disorder, recurrent, moderate: Secondary | ICD-10-CM

## 2017-03-12 DIAGNOSIS — M533 Sacrococcygeal disorders, not elsewhere classified: Secondary | ICD-10-CM | POA: Insufficient documentation

## 2017-03-12 DIAGNOSIS — G5793 Unspecified mononeuropathy of bilateral lower limbs: Secondary | ICD-10-CM | POA: Diagnosis not present

## 2017-03-12 DIAGNOSIS — M79605 Pain in left leg: Secondary | ICD-10-CM | POA: Diagnosis not present

## 2017-03-12 MED ORDER — GABAPENTIN 100 MG PO CAPS: ORAL_CAPSULE | ORAL | 3 refills | Status: DC

## 2017-03-12 NOTE — Progress Notes (Signed)
   Katelyn [name]     MRN: [id number]      DOB: 29-Jan-1976   HPI [name] is here for follow up c/o numbness and tingling in both thighs she experiences a burning pain on the anterior thighs, when sitting  She has burning pain in buttock, has hypersensitivity from groin down to both feet, can even feel wrinkles in her skin,hip bones this hyprsensitivity is when she sits , lies down, symptoms are worse on the left leg. Reports that she is aware  when she needs to urinate and have BM, but notes new dribbling of urine, no fecal incontinence , but new constipation needs stool sfteners, and with miralax and colace and magnesium has 3 to 4 BM per week , before surgery she needed immodium daily for diarrheah  ALL probs started post breast augmentation surgery on February 11, 2017 Has had some symptom relief from gabapentin, but insufficient and the quality of her life is extremely poor due to chronic pain which started since surgery is unexplained and disabling. Leg muscles feel weak and also back feels sore to some extent , however posterior left calf feels stiff and as though constantly flexed ROS Denies recent fever or chills. Denies sinus pressure, nasal congestion, ear pain or sore throat. Denies chest congestion, productive cough or wheezing. Denies chest pains, palpitations and leg swelling Denies abdominal pain, nausea, vomiting,diarrhea or constipation.   Denies dysuria, frequency, hesitancy or incontinence. . Denies depression,not suicidal or homicidal,. Engaged and planning her  wedding  does have increased anxiety , states sleep has improved since last visit Denies skin break down or rash.   PE  BP 108/70   Pulse (!) 106   Resp 16   Ht 5\' 6"  (1.676 m)   Wt 128 lb (58.1 kg)   LMP 02/11/2017   SpO2 98%   BMI 20.66 kg/m   Patient alert and oriented and in no cardiopulmonary distress.Anxious, states clearly this is not in her head  HEENT: No facial asymmetry, EOMI,   oropharynx  pink and moist.  Neck supple no JVD, no mass.  Chest: Clear to auscultation bilaterally.  CVS: S1, S2 no murmurs, no S3.Regular rate.  ABD: Soft non tender.   Ext: No edema  MS: Adequate ROM spine, shoulders, hips and knees.  Skin: Intact, no ulcerations or rash noted.  Psych: Good eye contact, normal affect. Memory intact  anxious  Not  depressed appearing.  CNS: CN 2-12 intact, power,  normal throughout.no focal deficits noted.   Assessment & Plan  Neuropathic pain of both legs Disabling lower extremity neuropathic pain following surgery,  Unclear etiology, minimal response to low dose gabapentin, will titrate up dose and refer to neurology for evaluation and management Increase gabapentin from 100 mg 3 times daily to 200 mg 3 times daily, script written for 300 mg 3 times daily  Sacral pain Sacral pain radiating to groin and genital area associated with urina incontinence and change in stool pattern  From diarrhea to constipation, symptom onset following breast augmentation surgery under general anaesthesia, neurology to evaluate and manage asap  Major depressive disorder, recurrent episode, moderate ([place]) Treated by psychiatry and reports stable and controlled

## 2017-03-12 NOTE — Assessment & Plan Note (Signed)
Treated by psychiatry and reports stable and controlled

## 2017-03-12 NOTE — Assessment & Plan Note (Signed)
Disabling lower extremity neuropathic pain following surgery,  Unclear etiology, minimal response to low dose gabapentin, will titrate up dose and refer to neurology for evaluation and management Increase gabapentin from 100 mg 3 times daily to 200 mg 3 times daily, script written for 300 mg 3 times daily

## 2017-03-12 NOTE — Assessment & Plan Note (Addendum)
Sacral pain radiating to groin and genital area associated with urina incontinence and change in stool pattern  From diarrhea to constipation, symptom onset following breast augmentation surgery under general anaesthesia, neurology to evaluate and manage asap

## 2017-03-12 NOTE — Patient Instructions (Signed)
F/U as before  Increase gabapentin to TWO three times daily  You are referred urgently to neurology.  Call if you need me before

## 2017-03-13 ENCOUNTER — Telehealth: Payer: Self-pay | Admitting: Family Medicine

## 2017-03-13 NOTE — Telephone Encounter (Signed)
Patient left message on nurse line stating that she called for neuro consult like Dr. Moshe Cipro said to and they can not see until 12/28. She wants to know what to do- the pain is too bad to wait.

## 2017-03-16 NOTE — Telephone Encounter (Signed)
Attempted to call patient and had to leave a  Message please let me know how soon she can be seen

## 2017-03-17 ENCOUNTER — Telehealth: Payer: Self-pay | Admitting: Family Medicine

## 2017-03-17 NOTE — Telephone Encounter (Signed)
Pt states she has appointment with neurologisrt Dr Posey Pronto in am , he will assume management of her pain

## 2017-03-17 NOTE — Telephone Encounter (Signed)
Patient left message on nurse line regarding gabapentin. She asks if she can increase it. She states that she discussed with Dr Moshe Cipro, and the prescription she was given allowed for an increase, but she believes she was told to call and ask/or inform before increasing.  Patient left second message stating that the neurologist called and had a cancellation so she is being seen tomorrow and will talk to them about the gabapentin. She states someone was trying to get her a sooner appointment with neuro but that is no longer necessary since she is being seen tomorrow.

## 2017-03-18 ENCOUNTER — Encounter: Payer: Self-pay | Admitting: Neurology

## 2017-03-18 ENCOUNTER — Other Ambulatory Visit (INDEPENDENT_AMBULATORY_CARE_PROVIDER_SITE_OTHER): Payer: PPO

## 2017-03-18 ENCOUNTER — Ambulatory Visit (INDEPENDENT_AMBULATORY_CARE_PROVIDER_SITE_OTHER): Payer: PPO | Admitting: Neurology

## 2017-03-18 VITALS — BP 118/64 | HR 117 | Ht 66.0 in | Wt 128.4 lb

## 2017-03-18 DIAGNOSIS — R202 Paresthesia of skin: Secondary | ICD-10-CM

## 2017-03-18 LAB — SEDIMENTATION RATE: SED RATE: 14 mm/h (ref 0–20)

## 2017-03-18 LAB — FOLATE

## 2017-03-18 LAB — VITAMIN B12: Vitamin B-12: 563 pg/mL (ref 211–911)

## 2017-03-18 LAB — C-REACTIVE PROTEIN: CRP: 0.3 mg/dL — AB (ref 0.5–20.0)

## 2017-03-18 LAB — TSH: TSH: 1.21 u[IU]/mL (ref 0.35–4.50)

## 2017-03-18 MED ORDER — GABAPENTIN 300 MG PO CAPS: 300.0000 mg | ORAL_CAPSULE | Freq: Three times a day (TID) | ORAL | 5 refills | Status: DC

## 2017-03-18 NOTE — Progress Notes (Signed)
Follow-up Visit   Date: 03/18/17    Katelyn [name] MRN: [id number] DOB: 07-23-1975   Interim History: Katelyn [name] is a 41 y.o. right-handed Caucasian female with history of history of diabetes, polycystic ovarian cystic syndrome, depression with psychosis, and mild OSA (not on CPAP) returning to the clinic with new complaints of bilateral leg pain.    History of present illness: Headaches started around 2012 after her ECT therapy (total of 12 sessions). Headaches are described as "tightness, pressure" over the forehead. Headache is daily (ranked as 3/10), with varying intensity throughout the day.  When severe, she has photophobia, nausea, and vomiting. Duration is 5-6 hours. Severe headaches occur once every 1-2 months. She has tried hot and cold compresses, ibuprofen, excedrin migraine, tylenol, and Aleve which does not help.   Her psychiatrist also tried topamax 44m but there was no change in symptoms. Of all the things she has tried, she has felt the most relief with hot/cold compresses.  Of note, she takes ziprasidone 40 MG for depression with psychosis for a number of years and has generalized tremors.   She established care with me in December 2014 at which time I increased her topamax to 1059md, which significantly helped. Headaches were well-controlled on this dose until Spring 2016 when she underwent more ECT, so it was increased to 20070m.   - UPDATE 03/16/2015:  She stopped treatment with ECT over the summer because of issues related to her memory and headaches.  Headaches have improved since then, migraines once per month and tension headaches 2-3 times per week.  She usually does not treat these headaches.  She continues to have short-term memory, especially with names, dates, and events.    UPDATE 03/18/2017:  Patient is here with new complaints of bilateral leg pain.  She underwent breast augmentation surgery on 02/11/2017, which was same-day surgery.  She was  instructed to rest that day and the following day, starting having pain with sitting, as if her tailbone was bruised.  Over the next few days, she developed burning pain over the side of buttocks and thighs and lower legs. She felt more comfortable standing, so would stand for most of the day, but then developed burning pain the left calf.  She also complains of numbness and tingling of the entire left leg, which is provoked by sitting.  She has increased sensitivity to her clothes, especially around her waist.  She denies any weakness or imbalance.   She has chronic low back pain, which is unchanged.  She denies any similar symptoms in the arms.  She takes gabapentin 200m14mree times daily which eases the pain some.    Medications:  Current Outpatient Medications on File Prior to Visit  Medication Sig Dispense Refill  . buPROPion (WELLBUTRIN) 75 MG tablet Take 2 tab twice a day 120 tablet 1  . conjugated estrogens (PREMARIN) vaginal cream Place 1 Applicatorful vaginally daily.    . Cyanocobalamin (B-12) 1000 MCG SUBL Place 1,000 mcg under the tongue daily. 30 each 6  . cyclobenzaprine (FLEXERIL) 5 MG tablet One tablet evey 8 hours as needed for headache 30 tablet 5  . FLUoxetine (PROZAC) 20 MG tablet Take 1 tablet (20 mg total) by mouth daily. 30 tablet 1  . gabapentin (NEURONTIN) 100 MG capsule Three capsules three times daily (Patient taking differently: 200 mg 3 (three) times daily. Two capsules three times daily) 270 capsule 3  . HYDROcodone-acetaminophen (NORCO/VICODIN) 5-325 MG tablet     .  Multiple Vitamin (MULTIVITAMIN WITH MINERALS) TABS tablet Take 1 tablet by mouth 2 (two) times daily.     . ondansetron (ZOFRAN) 4 MG tablet One tablet as needed, for headache 30 tablet 5  . temazepam (RESTORIL) 30 MG capsule Take 1 capsule (30 mg total) by mouth at bedtime as needed for sleep. 30 capsule 1  . tinidazole (TINDAMAX) 500 MG tablet TAKE 4 TABLETS PO DAILY  0  . topiramate (TOPAMAX) 100 MG  tablet Take 1 tablet (100 mg total) by mouth 2 (two) times daily. 60 tablet 5  . triamcinolone ointment (KENALOG) 0.1 %     . ziprasidone (GEODON) 80 MG capsule Take 1 capsule (80 mg total) by mouth at bedtime. 30 capsule 1   No current facility-administered medications on file prior to visit.     Allergies:  Allergies  Allergen Reactions  . Feraheme [Ferumoxytol] Hives  . Penicillins     Has patient had a PCN reaction causing immediate rash, facial/tongue/throat swelling, SOB or lightheadedness with hypotension: Yes Has patient had a PCN reaction causing severe rash involving mucus membranes or skin necrosis: No Has patient had a PCN reaction that required hospitalization No Has patient had a PCN reaction occurring within the last 10 years: No If all of the above answers are "NO", then may proceed with Cephalosporin use.     REACTION: Rash     Review of Systems:  CONSTITUTIONAL: No fevers, chills, night sweats,+ weight loss.   EYES: No visual changes or eye pain ENT: No hearing changes.  No history of nose bleeds.   RESPIRATORY: No cough, wheezing and shortness of breath.   CARDIOVASCULAR: Negative for chest pain, and palpitations.   GI: Negative for abdominal discomfort, blood in stools or black stools.  No recent change in bowel habits.   GU:  No history of incontinence.   MUSCLOSKELETAL: No history of joint pain or swelling.  No myalgias.   SKIN: Negative for lesions, rash, and itching.   ENDOCRINE: Negative for cold or heat intolerance, polydipsia or goiter.   PSYCH:  + depression or anxiety symptoms.   NEURO: As Above.   Vital Signs:  BP 118/64   Pulse (!) 117   Ht 5' 6"  (1.676 m)   Wt 128 lb 6 oz (58.2 kg)   SpO2 99%   BMI 20.72 kg/m    General Medical Exam:   General:  Well appearing, comfortable  Eyes/ENT: see cranial nerve examination.   Neck: No masses appreciated.  Full range of motion without tenderness.  No carotid bruits. Respiratory:  Clear to  auscultation, good air entry bilaterally.   Cardiac:  Regular rate and rhythm, no murmur.    Neurological Exam: MENTAL STATUS including orientation to time, place, person, recent and remote memory, attention span and concentration, language, and fund of knowledge is normal. Severely blunted affect. Speech is not dysarthric.  CRANIAL NERVES: Normal fundoscopic exam. No visual field defects. Pupils equal round and reactive to light.  Normal conjugate, extra-ocular eye movements in all directions of gaze.  No ptosis. Face is symmetric. Palate elevates symmetrically.  Tongue is midline.  MOTOR:  Motor strength is 5/5 in all extremities.  No atrophy, fasciculations or abnormal movements.  No pronator drift.  Tone is normal.    MSRs:  Reflexes are 2+/4 in the arms, 1+/4 in the arms, and absent Achilles.  SENSORY:  Intact to vibration, pin prick, and temperature throughout.  There is no sensory level.  COORDINATION/GAIT:  Normal finger-to- nose-finger  and heel-to-shin.  Intact rapid alternating movements bilaterally.  Gait narrow based and stable.    Data: Lab Results  Component Value Date   [id number] 196 06/24/2016   Lab Results  Component Value Date   TSH 1.17 12/19/2015   MRI brain 12/2010: Normal examination. No pituitary macroadenoma. No diagnosable microadenoma.    IMPRESSION/PLAN: 1.  Bilateral leg pain and paresthesias, ?skin hypersensitivity due to loss of protective fat padding around the nerves vs neuropathy, but it would be unusual to start following surgery.  Exam shows reduced reflexes in the legs, normal sensation and motor strength.  Because she continues to loose weight, nutritional deficiency needs to be evaluated which can have more diffuse onset of symptoms.  - NCS/EMG of the EMGs  - MRI lumbar spine wwo contrast to evaluate for structural pathology.  - Check ESR, CRP, vitamin B12, MMA, copper, TSH, folate, vitamin B1  - Increase gabapentin 375m three times  daily  2.  Chronic daily headaches, slightly worse following surgery but do not recommend increasing topiramate due to associated side effects of weight loss and paresthesias.  Stay on topiramate 1045mdaily.  Hopefully, increasing gabapentin will help.  Continue MgO 400-60048m.  3.  Migraine without aura, stable  - Continue zofran 4mg89md flexeril 5mg 58m for migraine which provides relief  - Unable to take NSAIDs due to gastric bypass  4.  Depression with psychosis, followed by Dr. ArfeeAdele Schilder S/p gastric bypass with significant weight loss (>150lb), unable to take extended release medication.   Thank you for allowing me to participate in patient's care.  If I can answer any additional questions, I would be pleased to do so.    Sincerely,    Shanequia Kendrick K. PatelPosey Pronto

## 2017-03-18 NOTE — Patient Instructions (Addendum)
MRI lumbar spine Check labs Nerve testing Increase gabapentin 300mg  three times daily   ELECTROMYOGRAM AND NERVE CONDUCTION STUDIES (EMG/NCS) INSTRUCTIONS  How to Prepare The neurologist conducting the EMG will need to know if you have certain medical conditions. Tell the neurologist and other EMG lab personnel if you: . Have a pacemaker or any other electrical medical device . Take blood-thinning medications . Have hemophilia, a blood-clotting disorder that causes prolonged bleeding Bathing Take a shower or bath shortly before your exam in order to remove oils from your skin. Don't apply lotions or creams before the exam.  What to Expect You'll likely be asked to change into a hospital gown for the procedure and lie down on an examination table. The following explanations can help you understand what will happen during the exam.  . Electrodes. The neurologist or a technician places surface electrodes at various locations on your skin depending on where you're experiencing symptoms. Or the neurologist may insert needle electrodes at different sites depending on your symptoms.  . Sensations. The electrodes will at times transmit a tiny electrical current that you may feel as a twinge or spasm. The needle electrode may cause discomfort or pain that usually ends shortly after the needle is removed. If you are concerned about discomfort or pain, you may want to talk to the neurologist about taking a short break during the exam.  . Instructions. During the needle EMG, the neurologist will assess whether there is any spontaneous electrical activity when the muscle is at rest - activity that isn't present in healthy muscle tissue - and the degree of activity when you slightly contract the muscle.  He or she will give you instructions on resting and contracting a muscle at appropriate times. Depending on what muscles and nerves the neurologist is examining, he or she may ask you to change positions  during the exam.  After your EMG You may experience some temporary, minor bruising where the needle electrode was inserted into your muscle. This bruising should fade within several days. If it persists, contact your primary care doctor.

## 2017-03-23 LAB — METHYLMALONIC ACID, SERUM: Methylmalonic Acid, Quant: 172 nmol/L (ref 87–318)

## 2017-03-23 LAB — COPPER, SERUM: Copper: 106 ug/dL (ref 70–175)

## 2017-03-23 LAB — VITAMIN B1: VITAMIN B1 (THIAMINE): 49 nmol/L — AB (ref 8–30)

## 2017-03-24 ENCOUNTER — Telehealth: Payer: Self-pay | Admitting: Neurology

## 2017-03-24 NOTE — Telephone Encounter (Signed)
Results were posted to [facility].  All results are normal.

## 2017-03-24 NOTE — Telephone Encounter (Signed)
Please advise on lab results.  I will call [facility] to check on status of MRI.

## 2017-03-24 NOTE — Telephone Encounter (Signed)
Patient calling to check the status of scheduling her MRI and Lab Results. Please Call. Thanks

## 2017-03-24 NOTE — Telephone Encounter (Signed)
Patient informed labs are normal.  Gave her # to Nathalie so that she can call to set up MRI.

## 2017-03-26 ENCOUNTER — Other Ambulatory Visit: Payer: Self-pay | Admitting: *Deleted

## 2017-03-26 ENCOUNTER — Telehealth: Payer: Self-pay | Admitting: Neurology

## 2017-03-26 MED ORDER — GABAPENTIN 300 MG PO CAPS: 600.0000 mg | ORAL_CAPSULE | Freq: Three times a day (TID) | ORAL | 5 refills | Status: DC

## 2017-03-26 NOTE — Telephone Encounter (Signed)
Patient given instructions and new Rx sent.

## 2017-03-26 NOTE — Telephone Encounter (Signed)
Pt called and said she is having increased pain and would like a call back maybe to adjust her medication

## 2017-03-26 NOTE — Telephone Encounter (Signed)
She is currently taking gabapentin 300 mg 3 times daily. It is okay to increase this by 300 mg every 3 days to a goal of 600 mg 3 times daily.  Please send new prescription, if needed.

## 2017-03-26 NOTE — Telephone Encounter (Signed)
Please advise 

## 2017-03-31 ENCOUNTER — Ambulatory Visit
Admission: RE | Admit: 2017-03-31 | Discharge: 2017-03-31 | Disposition: A | Discharge status: Home / Self Care | Payer: PPO | Source: Ambulatory Visit | Attending: Neurology | Admitting: Neurology

## 2017-03-31 DIAGNOSIS — M5126 Other intervertebral disc displacement, lumbar region: Secondary | ICD-10-CM | POA: Diagnosis not present

## 2017-03-31 DIAGNOSIS — R202 Paresthesia of skin: Secondary | ICD-10-CM

## 2017-03-31 MED ORDER — GADOBENATE DIMEGLUMINE 529 MG/ML IV SOLN
10.0000 mL | Freq: Once | INTRAVENOUS | Status: AC | PRN
  Administered 2017-03-31: 10 mL via INTRAVENOUS

## 2017-04-01 ENCOUNTER — Ambulatory Visit (HOSPITAL_COMMUNITY): Payer: Self-pay | Admitting: Psychiatry

## 2017-04-02 ENCOUNTER — Ambulatory Visit (INDEPENDENT_AMBULATORY_CARE_PROVIDER_SITE_OTHER): Payer: PPO | Admitting: Neurology

## 2017-04-02 ENCOUNTER — Telehealth: Payer: Self-pay | Admitting: *Deleted

## 2017-04-02 DIAGNOSIS — R202 Paresthesia of skin: Secondary | ICD-10-CM

## 2017-04-02 MED ORDER — GABAPENTIN 300 MG PO CAPS: 900.0000 mg | ORAL_CAPSULE | Freq: Three times a day (TID) | ORAL | 5 refills | Status: DC

## 2017-04-02 NOTE — Telephone Encounter (Signed)
Patient given results

## 2017-04-02 NOTE — Telephone Encounter (Signed)
-----   Message from Alda Berthold, DO sent at 03/31/2017  5:29 PM EST ----- Please inform patient that MRI lumbar spine looks good - there is no nerve impingement or compression.  Next step is NCS/EMG which is scheduled for later this week.

## 2017-04-02 NOTE — Procedures (Signed)
[facility]  [address]  [place], [place] [address] Tel: [phone] Fax:  [phone] Test Date:  04/02/2017  Patient: Katelyn [name] DOB: Nov 03, 1975 Physician: Narda Amber, DO  Sex: Female Height: 5\' 6"  Ref Phys: Narda Amber, DO  ID#: [id number]   Technician:    Patient Complaints: This is 41 year-old female referred for evaluation of bilateral leg paresthesias and pain.  NCV & EMG Findings: Extensive electrodiagnostic testing of the right upper and lower extremities shows: 1. All sensory responses including the right median, ulnar, sural, and superficial peroneal nerves are within normal limits. 2. All motor responses including the right median, ulnar, peroneal, and tibial nerves are within normal limits. 3. Right tibial H reflex study is within normal limits. 4. There is no evidence of active or chronic motor axon loss changes affecting any of the tested muscles. Motor unit configuration and recruitment pattern is within normal limits.   Impression: This is a normal study of the right side. In particular, there is no evidence of a sensorimotor polyneuropathy or cervical/lumbosacral radiculopathy.    ___________________________ Narda Amber, DO    Nerve Conduction Studies Anti Sensory Summary Table   Stim Site NR Peak (ms) Norm Peak (ms) P-T Amp (V) Norm P-T Amp  Left Sup Peroneal Anti Sensory (Ant Lat Mall)  12 cm    2.1 <4.5 18.6 >5  Right Sup Peroneal Anti Sensory (Ant Lat Mall)  12 cm    2.3 <4.5 22.5 >5  Left Sural Anti Sensory (Lat Mall)  Calf    2.6 <4.5 29.5 >5  Right Sural Anti Sensory (Lat Mall)  Calf    2.1 <4.5 28.8 >5   Motor Summary Table   Stim Site NR Onset (ms) Norm Onset (ms) O-P Amp (mV) Norm O-P Amp Site1 Site2 Delta-0 (ms) Dist (cm) Vel (m/s) Norm Vel (m/s)  Left Peroneal Motor (Ext Dig Brev)  Ankle    2.7 <5.5 5.8 >3 B Fib Ankle 6.6 35.0 53 >40  B Fib    9.3  5.4  Poplt B Fib 1.1 8.0 73 >40  Poplt    10.4  5.3          Right Peroneal Motor (Ext Dig Brev)  Ankle    2.8 <5.5 7.7 >3 B Fib Ankle 6.7 35.0 52 >40  B Fib    9.5  7.4  Poplt B Fib 1.0 8.0 80 >40  Poplt    10.5  7.4         Left Tibial Motor (Abd Hall Brev)  Ankle    4.5 <6.0 19.1 >8 Knee Ankle 8.2 38.5 47 >40  Knee    12.7  16.8         Right Tibial Motor (Abd Hall Brev)  Ankle    4.1 <6.0 21.3 >8 Knee Ankle 8.3 37.0 45 >40  Knee    12.4  18.5          H Reflex Studies   NR H-Lat (ms) Lat Norm (ms) L-R H-Lat (ms)  Left Tibial (Gastroc)     25.17 <35 5.71  Right Tibial (Gastroc)     30.88 <35 5.71   EMG   Side Muscle Ins Act Fibs Psw Fasc Number Recrt Dur Dur. Amp Amp. Poly Poly. Comment  Left AntTibialis Nml Nml Nml Nml Nml Nml Nml Nml Nml Nml Nml Nml N/A  Right AntTibialis Nml Nml Nml Nml Nml Nml Nml Nml Nml Nml Nml Nml N/A  Right Gastroc Nml  Nml Nml Nml Nml Nml Nml Nml Nml Nml Nml Nml N/A  Right Flex Dig Long Nml Nml Nml Nml Nml Nml Nml Nml Nml Nml Nml Nml N/A  Right RectFemoris Nml Nml Nml Nml Nml Nml Nml Nml Nml Nml Nml Nml N/A  Right GluteusMed Nml Nml Nml Nml Nml Nml Nml Nml Nml Nml Nml Nml N/A  Left Gastroc Nml Nml Nml Nml Nml Nml Nml Nml Nml Nml Nml Nml N/A  Left Flex Dig Long Nml Nml Nml Nml Nml Nml Nml Nml Nml Nml Nml Nml N/A  Left RectFemoris Nml Nml Nml Nml Nml Nml Nml Nml Nml Nml Nml Nml N/A  Left GluteusMed Nml Nml Nml Nml Nml Nml Nml Nml Nml Nml Nml Nml N/A  Left BicepsFemS Nml Nml Nml Nml Nml Nml Nml Nml Nml Nml Nml Nml N/A  Right BicepsFemS Nml Nml Nml Nml Nml Nml Nml Nml Nml Nml Nml Nml N/A      Waveforms:

## 2017-04-03 ENCOUNTER — Telehealth: Payer: Self-pay | Admitting: *Deleted

## 2017-04-03 ENCOUNTER — Other Ambulatory Visit: Payer: Self-pay | Admitting: *Deleted

## 2017-04-03 ENCOUNTER — Telehealth: Payer: Self-pay | Admitting: Neurology

## 2017-04-03 MED ORDER — GABAPENTIN 300 MG PO CAPS: 600.0000 mg | ORAL_CAPSULE | Freq: Three times a day (TID) | ORAL | 11 refills | Status: DC

## 2017-04-03 NOTE — Telephone Encounter (Signed)
Rx sent again

## 2017-04-03 NOTE — Telephone Encounter (Signed)
Patient called and her prescription is not at her Pharmacy. Thanks

## 2017-04-03 NOTE — Telephone Encounter (Signed)
-----   Message from Alda Berthold, DO sent at 04/02/2017  2:57 PM EST ----- Please inform patient that nerve testing is normal - you will have to call her, she does not remember her Mychart login.  Thanks.

## 2017-04-03 NOTE — Telephone Encounter (Signed)
Called patient and gave her the results.

## 2017-04-08 ENCOUNTER — Ambulatory Visit (HOSPITAL_COMMUNITY): Payer: Self-pay | Admitting: Psychiatry

## 2017-04-10 ENCOUNTER — Telehealth: Payer: Self-pay | Admitting: Neurology

## 2017-04-10 NOTE — Telephone Encounter (Signed)
Please advise 

## 2017-04-10 NOTE — Telephone Encounter (Signed)
Patient notified but she is still in terrible pain and can't really function.  Any other suggestions?

## 2017-04-10 NOTE — Telephone Encounter (Signed)
Right now, I would recommend treating the pain with gabapentin as she is already doing.  I would avoid making too many dose changes without allowing 2-3 weeks for the medication to stabilize.

## 2017-04-10 NOTE — Telephone Encounter (Signed)
Pt called and said she wants to know what her diagnosis and treatment plan is because she said she never received that when she was here

## 2017-04-10 NOTE — Telephone Encounter (Signed)
With her normal nerve testing and MRI lumbar spine, her pain and tingling has not caused any nerve injury and should resolve with time.  It is likely due to significant weight loss and stress of surgery, moreso than surgery itself.    Continue gabapentin 900mg  three times daily.  If pain steadily improves, we can slowly reduce the dose. Marland Kitchen   Return to clinic in 3 months to reassess.

## 2017-04-13 NOTE — Telephone Encounter (Signed)
Gave patient the instructions per Dr. Posey Pronto and she agreed with plan.

## 2017-04-15 ENCOUNTER — Other Ambulatory Visit (HOSPITAL_COMMUNITY)
Admission: AD | Admit: 2017-04-15 | Discharge: 2017-04-15 | Disposition: A | Discharge status: Home / Self Care | Payer: PPO | Source: Skilled Nursing Facility | Attending: Family Medicine | Admitting: Family Medicine

## 2017-04-15 ENCOUNTER — Ambulatory Visit (INDEPENDENT_AMBULATORY_CARE_PROVIDER_SITE_OTHER): Payer: PPO | Admitting: Family Medicine

## 2017-04-15 ENCOUNTER — Other Ambulatory Visit: Payer: Self-pay

## 2017-04-15 ENCOUNTER — Encounter: Payer: Self-pay | Admitting: Family Medicine

## 2017-04-15 VITALS — BP 118/66 | HR 104 | Temp 98.5°F | Resp 18 | Ht 66.0 in | Wt 130.1 lb

## 2017-04-15 DIAGNOSIS — F411 Generalized anxiety disorder: Secondary | ICD-10-CM | POA: Diagnosis not present

## 2017-04-15 DIAGNOSIS — F331 Major depressive disorder, recurrent, moderate: Secondary | ICD-10-CM | POA: Diagnosis not present

## 2017-04-15 DIAGNOSIS — G5793 Unspecified mononeuropathy of bilateral lower limbs: Secondary | ICD-10-CM

## 2017-04-15 DIAGNOSIS — X58XXXA Exposure to other specified factors, initial encounter: Secondary | ICD-10-CM | POA: Diagnosis not present

## 2017-04-15 DIAGNOSIS — S21002A Unspecified open wound of left breast, initial encounter: Secondary | ICD-10-CM | POA: Diagnosis not present

## 2017-04-15 DIAGNOSIS — F509 Eating disorder, unspecified: Secondary | ICD-10-CM

## 2017-04-15 MED ORDER — FLUCONAZOLE 150 MG PO TABS: 150.0000 mg | ORAL_TABLET | Freq: Once | ORAL | 0 refills | Status: AC

## 2017-04-15 NOTE — Patient Instructions (Signed)
F/u in 4 months, call if  You need me before  Thankful that cause of pain has been identified, and you are in expert hands   We have sent off a culture of the wound of left breast , sensitivity will take on average  5 days for completion. Please follow direction o f surgeon re wound  Management     Thanks for choosing [facility], we consider it a privelige to serve you.

## 2017-04-15 NOTE — Progress Notes (Signed)
Katelyn [name]     MRN: [id number]      DOB: Sep 16, 1975   HPI [name] is here for follow up and re-evaluation of chronic medical conditions, medication management and review of any available recent lab and radiology data.  Preventive health is updated, specifically  Cancer screening and Immunization.   Questions or concerns regarding consultations or procedures which the PT has had in the interim are  Addressed. We review the neurology evaluation and management of her chronic pain, and though she is "frustrated" she understands that the only way out, is through weight gain and she is working on this, gabapentin is the medication being used for pain control The PT denies any adverse reactions to current medications since the last visit.  Open sore of left breast starting 1 week ago, had spike of temp to 101.6 , she called her surgeon and yesterday and was started on cipro 500 mg twice daily for 5 day, she will see hm in am  ROS Denies recent fever or chills. C/o generalized pain and anxiety Denies sinus pressure, nasal congestion, ear pain or sore throat. Denies chest congestion, productive cough or wheezing. Denies chest pains, palpitations and leg swelling Denies abdominal pain, nausea, vomiting,diarrhea or constipation.   Denies dysuria, frequency, hesitancy or incontinence. . Denies headaches, seizures, numbness, or tingling. C/o  depression, anxiety and  Insomnia.Denies suicidal or homicidal ideation, but very stressed  PE  BP 118/66 (BP Location: Left Arm, Patient Position: Sitting, Cuff Size: Normal)   Pulse (!) 104   Temp 98.5 F (36.9 C) (Temporal)   Resp 18   Ht 5\' 6"  (1.676 m)   Wt 130 lb 1.9 oz (59 kg)   LMP 04/11/2017 (Exact Date)   SpO2 99%   BMI 21.00 kg/m   Patient alert and oriented and in no cardiopulmonary distress.  HEENT: No facial asymmetry, EOMI,   oropharynx pink and moist.  Neck supple no JVD, no mass.  Chest: Clear to auscultation  bilaterally.  CVS: S1, S2 no murmurs, no S3.Regular rate. Breast: open draining sore at 5 o clock position in areolar area, mildly erythematous  Of left breast  ABD: Soft non tender.   Ext: No edema  MS: Adequate ROM spine, shoulders, hips and knees.  Skin: Intact, no ulcerations or rash noted.  Psych: Good eye contact, mildly anxious affect affect. Memory intact  anxious not  depressed appearing.  CNS: CN 2-12 intact, power,  normal throughout.no focal deficits noted.   Assessment & Plan  Open breast wound, left, initial encounter Open wound left breast where patient had recent breast augmentation surgery, currently draining serous appearing fluid, pt reports purulent drainage , surrounding area mildly erythematous. On day 2 of cipro started by surgeon. She is to continue same, wound care to continue per surgeon who she sees in am. C/S sent  Neuropathic pain of both legs Nerve conduction studies are all normal. Management is by neurology, weight gain essential for pain mmangement , in the interim, titrated doses of gabapentin are being used for medication  management  Generalized anxiety disorder Pt managing to "keep things together" despite current medical stress she is under, however , as expected, she reports finding it very challenging at times   Major depressive disorder, recurrent episode, moderate ([place]) Closely followed by psychiatry , currently controlled  Eating disorder An ongoing challenge for which she is treated by psychiatry, need to regain weight as a part of management of neuropathic pain States she has  very low body fat desppite a reasonable BMI , a lot of her weight is due to skin

## 2017-04-16 ENCOUNTER — Encounter (HOSPITAL_COMMUNITY): Payer: Self-pay | Admitting: Psychiatry

## 2017-04-16 ENCOUNTER — Ambulatory Visit (HOSPITAL_COMMUNITY): Payer: PPO | Admitting: Psychiatry

## 2017-04-16 VITALS — BP 118/66 | HR 111 | Ht 65.5 in | Wt 131.0 lb

## 2017-04-16 DIAGNOSIS — F411 Generalized anxiety disorder: Secondary | ICD-10-CM | POA: Diagnosis not present

## 2017-04-16 DIAGNOSIS — F509 Eating disorder, unspecified: Secondary | ICD-10-CM | POA: Diagnosis not present

## 2017-04-16 DIAGNOSIS — Z87891 Personal history of nicotine dependence: Secondary | ICD-10-CM

## 2017-04-16 DIAGNOSIS — F331 Major depressive disorder, recurrent, moderate: Secondary | ICD-10-CM

## 2017-04-16 DIAGNOSIS — Z818 Family history of other mental and behavioral disorders: Secondary | ICD-10-CM | POA: Diagnosis not present

## 2017-04-16 DIAGNOSIS — Z811 Family history of alcohol abuse and dependence: Secondary | ICD-10-CM | POA: Diagnosis not present

## 2017-04-16 DIAGNOSIS — G8929 Other chronic pain: Secondary | ICD-10-CM

## 2017-04-16 MED ORDER — BUPROPION HCL 75 MG PO TABS: ORAL_TABLET | ORAL | 1 refills | Status: DC

## 2017-04-16 MED ORDER — ZIPRASIDONE HCL 60 MG PO CAPS: 60.0000 mg | ORAL_CAPSULE | Freq: Every day | ORAL | 1 refills | Status: DC

## 2017-04-16 MED ORDER — CLONAZEPAM 0.5 MG PO TABS: 0.5000 mg | ORAL_TABLET | Freq: Every day | ORAL | 0 refills | Status: DC | PRN

## 2017-04-16 MED ORDER — TEMAZEPAM 30 MG PO CAPS: 30.0000 mg | ORAL_CAPSULE | Freq: Every evening | ORAL | 1 refills | Status: DC | PRN

## 2017-04-16 MED ORDER — FLUOXETINE HCL 20 MG PO TABS: 20.0000 mg | ORAL_TABLET | Freq: Every day | ORAL | 1 refills | Status: DC
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
## 2017-04-18 LAB — AEROBIC CULTURE W GRAM STAIN (SUPERFICIAL SPECIMEN)

## 2017-04-18 LAB — AEROBIC CULTURE  (SUPERFICIAL SPECIMEN): CULTURE: NO GROWTH

## 2017-04-20 ENCOUNTER — Encounter: Payer: Self-pay | Admitting: Family Medicine

## 2017-04-20 DIAGNOSIS — S21002A Unspecified open wound of left breast, initial encounter: Secondary | ICD-10-CM | POA: Insufficient documentation

## 2017-04-20 NOTE — Assessment & Plan Note (Signed)
Nerve conduction studies are all normal. Management is by neurology, weight gain essential for pain mmangement , in the interim, titrated doses of gabapentin are being used for medication  management

## 2017-04-20 NOTE — Assessment & Plan Note (Addendum)
An ongoing challenge for which she is treated by psychiatry, need to regain weight as a part of management of neuropathic pain States she has very low body fat desppite a reasonable BMI , a lot of her weight is due to skin

## 2017-04-20 NOTE — Assessment & Plan Note (Signed)
Pt managing to "keep things together" despite current medical stress she is under, however , as expected, she reports finding it very challenging at times

## 2017-04-20 NOTE — Assessment & Plan Note (Signed)
Open wound left breast where patient had recent breast augmentation surgery, currently draining serous appearing fluid, pt reports purulent drainage , surrounding area mildly erythematous. On day 2 of cipro started by surgeon. She is to continue same, wound care to continue per surgeon who she sees in am. C/S sent

## 2017-04-20 NOTE — Assessment & Plan Note (Signed)
Closely followed by psychiatry , currently controlled

## 2017-04-24 ENCOUNTER — Encounter: Payer: Self-pay | Admitting: Gastroenterology

## 2017-04-24 ENCOUNTER — Ambulatory Visit: Payer: Self-pay | Admitting: Neurology

## 2017-04-28 DIAGNOSIS — Z86718 Personal history of other venous thrombosis and embolism: Secondary | ICD-10-CM

## 2017-04-28 HISTORY — DX: Personal history of other venous thrombosis and embolism: Z86.718

## 2017-04-30 DIAGNOSIS — R630 Anorexia: Secondary | ICD-10-CM | POA: Diagnosis not present

## 2017-04-30 DIAGNOSIS — F3112 Bipolar disorder, current episode manic without psychotic features, moderate: Secondary | ICD-10-CM | POA: Diagnosis not present

## 2017-04-30 DIAGNOSIS — F411 Generalized anxiety disorder: Secondary | ICD-10-CM | POA: Diagnosis not present

## 2017-05-01 ENCOUNTER — Telehealth: Payer: Self-pay | Admitting: Family Medicine

## 2017-05-01 ENCOUNTER — Other Ambulatory Visit (HOSPITAL_COMMUNITY): Payer: Self-pay | Admitting: Oncology

## 2017-05-01 ENCOUNTER — Inpatient Hospital Stay (HOSPITAL_COMMUNITY): Payer: PPO | Attending: Oncology

## 2017-05-01 DIAGNOSIS — D509 Iron deficiency anemia, unspecified: Secondary | ICD-10-CM

## 2017-05-01 DIAGNOSIS — D508 Other iron deficiency anemias: Secondary | ICD-10-CM | POA: Insufficient documentation

## 2017-05-01 DIAGNOSIS — E538 Deficiency of other specified B group vitamins: Secondary | ICD-10-CM | POA: Insufficient documentation

## 2017-05-01 LAB — CBC WITH DIFFERENTIAL/PLATELET
BASOS ABS: 0 10*3/uL (ref 0.0–0.1)
Basophils Relative: 0 %
EOS PCT: 1 %
Eosinophils Absolute: 0.1 10*3/uL (ref 0.0–0.7)
HEMATOCRIT: 42.5 % (ref 36.0–46.0)
HEMOGLOBIN: 14 g/dL (ref 12.0–15.0)
LYMPHS ABS: 1.7 10*3/uL (ref 0.7–4.0)
LYMPHS PCT: 18 %
MCH: 32.6 pg (ref 26.0–34.0)
MCHC: 32.9 g/dL (ref 30.0–36.0)
MCV: 99.1 fL (ref 78.0–100.0)
Monocytes Absolute: 0.5 10*3/uL (ref 0.1–1.0)
Monocytes Relative: 5 %
NEUTROS ABS: 6.9 10*3/uL (ref 1.7–7.7)
NEUTROS PCT: 76 %
PLATELETS: 315 10*3/uL (ref 150–400)
RBC: 4.29 MIL/uL (ref 3.87–5.11)
RDW: 12.4 % (ref 11.5–15.5)
WBC: 9.1 10*3/uL (ref 4.0–10.5)

## 2017-05-01 LAB — VITAMIN B12: Vitamin B-12: 612 pg/mL (ref 180–914)

## 2017-05-01 LAB — FERRITIN: FERRITIN: 39 ng/mL (ref 11–307)

## 2017-05-01 LAB — IRON AND TIBC
IRON: 58 ug/dL (ref 28–170)
SATURATION RATIOS: 18 % (ref 10.4–31.8)
TIBC: 322 ug/dL (ref 250–450)
UIBC: 264 ug/dL

## 2017-05-01 LAB — FOLATE: Folate: 68.1 ng/mL (ref >5.9)

## 2017-05-01 NOTE — Telephone Encounter (Signed)
Patient left message on nurse line requesting wound culture results because she has another wound infection and wants to know what the first results were.  Callback# [phone]

## 2017-05-04 ENCOUNTER — Encounter: Payer: Self-pay | Admitting: Family Medicine

## 2017-05-04 NOTE — Telephone Encounter (Signed)
Please add result note and I will call patient

## 2017-05-04 NOTE — Telephone Encounter (Signed)
Wound grew no bacteria, I will also send pt a direct message

## 2017-05-05 ENCOUNTER — Inpatient Hospital Stay (HOSPITAL_COMMUNITY): Payer: PPO | Attending: Hematology and Oncology

## 2017-05-05 ENCOUNTER — Other Ambulatory Visit: Payer: Self-pay

## 2017-05-05 VITALS — BP 112/70 | HR 71 | Temp 98.4°F | Resp 18 | Wt 132.0 lb

## 2017-05-05 DIAGNOSIS — D509 Iron deficiency anemia, unspecified: Secondary | ICD-10-CM

## 2017-05-05 DIAGNOSIS — D508 Other iron deficiency anemias: Secondary | ICD-10-CM | POA: Diagnosis not present

## 2017-05-05 MED ORDER — SODIUM CHLORIDE 0.9 % IV SOLN
750.0000 mg | Freq: Once | INTRAVENOUS | Status: AC
  Administered 2017-05-05: 750 mg via INTRAVENOUS
  Filled 2017-05-05: qty 15

## 2017-05-05 MED ORDER — SODIUM CHLORIDE 0.9 % IV SOLN
INTRAVENOUS | Status: DC
  Administered 2017-05-05: 11:00:00 via INTRAVENOUS

## 2017-05-05 NOTE — Progress Notes (Signed)
Tolerated infusion w/o adverse reaction.  Alert, in no distress.  VSS.  Discharged ambulatory.  

## 2017-05-15 DIAGNOSIS — F411 Generalized anxiety disorder: Secondary | ICD-10-CM | POA: Diagnosis not present

## 2017-05-15 DIAGNOSIS — F3112 Bipolar disorder, current episode manic without psychotic features, moderate: Secondary | ICD-10-CM | POA: Diagnosis not present

## 2017-05-15 DIAGNOSIS — R630 Anorexia: Secondary | ICD-10-CM | POA: Diagnosis not present

## 2017-05-21 ENCOUNTER — Telehealth: Payer: Self-pay | Admitting: Neurology

## 2017-05-21 NOTE — Telephone Encounter (Signed)
I spoke with patient and she is taking gabapentin 900 mg tid which is not helping.  Please advise.

## 2017-05-21 NOTE — Telephone Encounter (Signed)
Pt called and said Dr Posey Pronto has her on gabapentin and was told if not helping her to call back and it is not helping so she would like a call back

## 2017-05-22 ENCOUNTER — Other Ambulatory Visit: Payer: Self-pay | Admitting: *Deleted

## 2017-05-22 MED ORDER — GABAPENTIN 300 MG PO CAPS: ORAL_CAPSULE | ORAL | 5 refills | Status: DC

## 2017-05-22 NOTE — Telephone Encounter (Signed)
Patient given instructions and agreed with plan.  (she said that gabapentin does not make her drowsy)  New Rx sent in.

## 2017-05-22 NOTE — Telephone Encounter (Signed)
Is she feeling drowsy on the gabapentin? If not, can try increasing to 1200mg  TID.

## 2017-05-28 ENCOUNTER — Ambulatory Visit (INDEPENDENT_AMBULATORY_CARE_PROVIDER_SITE_OTHER): Payer: PPO | Admitting: Psychiatry

## 2017-05-28 ENCOUNTER — Encounter (HOSPITAL_COMMUNITY): Payer: Self-pay | Admitting: Psychiatry

## 2017-05-28 DIAGNOSIS — F331 Major depressive disorder, recurrent, moderate: Secondary | ICD-10-CM

## 2017-05-28 DIAGNOSIS — M792 Neuralgia and neuritis, unspecified: Secondary | ICD-10-CM

## 2017-05-28 DIAGNOSIS — Z818 Family history of other mental and behavioral disorders: Secondary | ICD-10-CM

## 2017-05-28 DIAGNOSIS — Z811 Family history of alcohol abuse and dependence: Secondary | ICD-10-CM

## 2017-05-28 DIAGNOSIS — F419 Anxiety disorder, unspecified: Secondary | ICD-10-CM | POA: Diagnosis not present

## 2017-05-28 DIAGNOSIS — F509 Eating disorder, unspecified: Secondary | ICD-10-CM | POA: Diagnosis not present

## 2017-05-28 DIAGNOSIS — Z87891 Personal history of nicotine dependence: Secondary | ICD-10-CM

## 2017-05-28 DIAGNOSIS — Z79899 Other long term (current) drug therapy: Secondary | ICD-10-CM | POA: Diagnosis not present

## 2017-05-28 MED ORDER — TEMAZEPAM 22.5 MG PO CAPS: 22.5000 mg | ORAL_CAPSULE | Freq: Every evening | ORAL | 1 refills | Status: DC | PRN

## 2017-05-28 MED ORDER — ZIPRASIDONE HCL 40 MG PO CAPS: 40.0000 mg | ORAL_CAPSULE | Freq: Every day | ORAL | 1 refills | Status: DC
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
## 2017-05-29 DIAGNOSIS — R630 Anorexia: Secondary | ICD-10-CM | POA: Diagnosis not present

## 2017-05-29 DIAGNOSIS — F3112 Bipolar disorder, current episode manic without psychotic features, moderate: Secondary | ICD-10-CM | POA: Diagnosis not present

## 2017-05-29 DIAGNOSIS — F411 Generalized anxiety disorder: Secondary | ICD-10-CM | POA: Diagnosis not present

## 2017-06-04 ENCOUNTER — Telehealth: Payer: Self-pay | Admitting: Family Medicine

## 2017-06-04 NOTE — Telephone Encounter (Signed)
Patient left message on nurse line stating that her psychiatrist advised she get a second opinion from neurology. She called Dr. Lynnette Caffey in [place] and he is by referral only. His phone number is [phone]

## 2017-06-05 ENCOUNTER — Other Ambulatory Visit: Payer: Self-pay | Admitting: Family Medicine

## 2017-06-05 DIAGNOSIS — M79605 Pain in left leg: Principal | ICD-10-CM

## 2017-06-05 DIAGNOSIS — M79604 Pain in right leg: Secondary | ICD-10-CM

## 2017-06-05 NOTE — Telephone Encounter (Signed)
Please let pt know that she has been referred and contact the office for an appointment , thanks

## 2017-06-08 ENCOUNTER — Telehealth: Payer: Self-pay | Admitting: Family Medicine

## 2017-06-08 NOTE — Telephone Encounter (Signed)
Patient left a voicemail checking the status of a neurology referral. I called her back to let her know this was already done Friday before 12.  Fax#: [phone]

## 2017-06-08 NOTE — Telephone Encounter (Signed)
Patient left message on nurse line stating that she called the neurology office to schedule and they state they did not receive the fax.

## 2017-06-26 ENCOUNTER — Other Ambulatory Visit (HOSPITAL_COMMUNITY): Payer: Self-pay | Admitting: Psychiatry

## 2017-06-26 DIAGNOSIS — F331 Major depressive disorder, recurrent, moderate: Secondary | ICD-10-CM

## 2017-07-01 DIAGNOSIS — F333 Major depressive disorder, recurrent, severe with psychotic symptoms: Secondary | ICD-10-CM | POA: Diagnosis not present

## 2017-07-01 DIAGNOSIS — M533 Sacrococcygeal disorders, not elsewhere classified: Secondary | ICD-10-CM | POA: Diagnosis not present

## 2017-07-03 DIAGNOSIS — R630 Anorexia: Secondary | ICD-10-CM | POA: Diagnosis not present

## 2017-07-03 DIAGNOSIS — F411 Generalized anxiety disorder: Secondary | ICD-10-CM | POA: Diagnosis not present

## 2017-07-03 DIAGNOSIS — F3112 Bipolar disorder, current episode manic without psychotic features, moderate: Secondary | ICD-10-CM | POA: Diagnosis not present

## 2017-07-06 ENCOUNTER — Telehealth (HOSPITAL_COMMUNITY): Payer: Self-pay

## 2017-07-06 DIAGNOSIS — F331 Major depressive disorder, recurrent, moderate: Secondary | ICD-10-CM

## 2017-07-06 NOTE — Telephone Encounter (Signed)
Patient called and said she is experiencing increased anxiety since decreasing the Geodon and would like to know if she can go back up on the dose. Please review and advise, thank you

## 2017-07-07 MED ORDER — ZIPRASIDONE HCL 60 MG PO CAPS: 60.0000 mg | ORAL_CAPSULE | Freq: Every day | ORAL | 1 refills | Status: DC

## 2017-07-07 NOTE — Telephone Encounter (Signed)
She should go back to Geodon 60 mg to help anxiety.  Please call a new prescription if needed.

## 2017-07-09 ENCOUNTER — Ambulatory Visit (HOSPITAL_COMMUNITY): Payer: Self-pay | Admitting: Psychiatry

## 2017-07-13 ENCOUNTER — Other Ambulatory Visit: Payer: Self-pay | Admitting: *Deleted

## 2017-07-13 ENCOUNTER — Telehealth: Payer: Self-pay | Admitting: *Deleted

## 2017-07-13 MED ORDER — PREGABALIN 50 MG PO CAPS: 50.0000 mg | ORAL_CAPSULE | Freq: Three times a day (TID) | ORAL | 2 refills | Status: DC

## 2017-07-13 NOTE — Telephone Encounter (Signed)
Patient given instructions but would like something for the pain.

## 2017-07-13 NOTE — Telephone Encounter (Signed)
Patient called stating that she is having a reaction to the gabapentin.  She has been back on it for a month now and she is having agitation, crying spells and overreacting to things.  She has also had some swelling in her hands and still having pain.  She takes 1200 mg tid.  She called the after hours clinic and they instructed her to go to urgent care but she wanted to wait to talk to [place] this morning.  Please advise.

## 2017-07-13 NOTE — Telephone Encounter (Signed)
Offered nortriptyline and Cymbalta, but patient reports having intolerances to these medications.  She is agreeable to starting low dose Lyrica 50mg  TID as we taper her gabapentin.   Dajion Bickford K. Posey Pronto, DO

## 2017-07-13 NOTE — Telephone Encounter (Signed)
Her behavioral symptoms are not typical as a side effect of gabapentin and maybe moreso related to her mood disorder, that she is seeing psychiatry.  Swelling can be a side effect - she can slowly reduce the dose to 900mg  TID x 1 week, then 600mg  TID x 1 week, then 300mg  TID and see if this helps.

## 2017-07-15 DIAGNOSIS — H5213 Myopia, bilateral: Secondary | ICD-10-CM | POA: Diagnosis not present

## 2017-07-15 DIAGNOSIS — H52221 Regular astigmatism, right eye: Secondary | ICD-10-CM | POA: Diagnosis not present

## 2017-07-16 ENCOUNTER — Telehealth: Payer: Self-pay | Admitting: Neurology

## 2017-07-16 NOTE — Telephone Encounter (Signed)
Instructed patient to start the lyrica per Dr. Serita Grit last note.

## 2017-07-16 NOTE — Telephone Encounter (Signed)
Patient called and needs to know how to start the Lyrica medication? She has been tapering off other medications. Please call. Thanks

## 2017-07-17 ENCOUNTER — Telehealth: Payer: Self-pay | Admitting: Neurology

## 2017-07-17 NOTE — Telephone Encounter (Signed)
Pt took benadryl and now she has a rash and still has the swelling in the throat

## 2017-07-17 NOTE — Telephone Encounter (Signed)
Patient had called earlier and said that her throat felt a little swollen about 2 minutes after taking her first dose of lyrica.  She said that she called her pharmacist who instructed her to take a benadryl which she did.  She was not having any SOB or other symptoms at that time. Instructed her to call 911 if swelling gets worse or she feels like she can't breathe.  Also recommended that she take another benadryl if needed.  I called patient back after getting the message about the rash and she said that the swelling in her throat was getting better.  Gave her instructions again about calling 911 if needed and to not take anymore lyrica.  Patient agreed with plan.

## 2017-07-20 ENCOUNTER — Ambulatory Visit (HOSPITAL_COMMUNITY): Payer: PPO | Admitting: Psychiatry

## 2017-07-20 ENCOUNTER — Encounter (HOSPITAL_COMMUNITY): Payer: Self-pay | Admitting: Psychiatry

## 2017-07-20 DIAGNOSIS — M5136 Other intervertebral disc degeneration, lumbar region: Secondary | ICD-10-CM | POA: Diagnosis not present

## 2017-07-20 DIAGNOSIS — G43109 Migraine with aura, not intractable, without status migrainosus: Secondary | ICD-10-CM | POA: Diagnosis not present

## 2017-07-20 DIAGNOSIS — M533 Sacrococcygeal disorders, not elsewhere classified: Secondary | ICD-10-CM | POA: Diagnosis not present

## 2017-07-20 DIAGNOSIS — F411 Generalized anxiety disorder: Secondary | ICD-10-CM | POA: Diagnosis not present

## 2017-07-20 DIAGNOSIS — F331 Major depressive disorder, recurrent, moderate: Secondary | ICD-10-CM

## 2017-07-20 DIAGNOSIS — M47816 Spondylosis without myelopathy or radiculopathy, lumbar region: Secondary | ICD-10-CM | POA: Diagnosis not present

## 2017-07-20 DIAGNOSIS — G894 Chronic pain syndrome: Secondary | ICD-10-CM | POA: Diagnosis not present

## 2017-07-20 MED ORDER — CLONAZEPAM 0.5 MG PO TABS: 0.5000 mg | ORAL_TABLET | Freq: Two times a day (BID) | ORAL | 1 refills | Status: DC

## 2017-07-20 MED ORDER — TEMAZEPAM 22.5 MG PO CAPS: 22.5000 mg | ORAL_CAPSULE | Freq: Every evening | ORAL | 1 refills | Status: DC | PRN

## 2017-07-20 MED ORDER — BUPROPION HCL 75 MG PO TABS: ORAL_TABLET | ORAL | 1 refills | Status: DC
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
## 2017-07-22 DIAGNOSIS — G43109 Migraine with aura, not intractable, without status migrainosus: Secondary | ICD-10-CM | POA: Insufficient documentation

## 2017-07-29 ENCOUNTER — Inpatient Hospital Stay (HOSPITAL_COMMUNITY): Payer: PPO | Attending: Internal Medicine

## 2017-07-29 DIAGNOSIS — K909 Intestinal malabsorption, unspecified: Secondary | ICD-10-CM | POA: Insufficient documentation

## 2017-07-29 DIAGNOSIS — Z9884 Bariatric surgery status: Secondary | ICD-10-CM | POA: Diagnosis not present

## 2017-07-29 DIAGNOSIS — Z87891 Personal history of nicotine dependence: Secondary | ICD-10-CM | POA: Insufficient documentation

## 2017-07-29 DIAGNOSIS — D508 Other iron deficiency anemias: Secondary | ICD-10-CM | POA: Diagnosis not present

## 2017-07-29 DIAGNOSIS — E876 Hypokalemia: Secondary | ICD-10-CM | POA: Diagnosis not present

## 2017-07-29 DIAGNOSIS — D509 Iron deficiency anemia, unspecified: Secondary | ICD-10-CM

## 2017-07-29 LAB — CBC WITH DIFFERENTIAL/PLATELET
BASOS ABS: 0 10*3/uL (ref 0.0–0.1)
Basophils Relative: 0 %
EOS ABS: 0.1 10*3/uL (ref 0.0–0.7)
EOS PCT: 1 %
HCT: 43.5 % (ref 36.0–46.0)
Hemoglobin: 14.5 g/dL (ref 12.0–15.0)
LYMPHS ABS: 2.2 10*3/uL (ref 0.7–4.0)
Lymphocytes Relative: 26 %
MCH: 32.9 pg (ref 26.0–34.0)
MCHC: 33.3 g/dL (ref 30.0–36.0)
MCV: 98.6 fL (ref 78.0–100.0)
MONO ABS: 0.4 10*3/uL (ref 0.1–1.0)
Monocytes Relative: 4 %
Neutro Abs: 5.8 10*3/uL (ref 1.7–7.7)
Neutrophils Relative %: 69 %
PLATELETS: 278 10*3/uL (ref 150–400)
RBC: 4.41 MIL/uL (ref 3.87–5.11)
RDW: 12.6 % (ref 11.5–15.5)
WBC: 8.4 10*3/uL (ref 4.0–10.5)

## 2017-07-29 LAB — IRON AND TIBC
Iron: 63 ug/dL (ref 28–170)
Saturation Ratios: 24 % (ref 10.4–31.8)
TIBC: 259 ug/dL (ref 250–450)
UIBC: 196 ug/dL

## 2017-07-29 LAB — FOLATE: Folate: 36.6 ng/mL (ref >5.9)

## 2017-07-29 LAB — VITAMIN B12: VITAMIN B 12: 606 pg/mL (ref 180–914)

## 2017-07-29 LAB — FERRITIN: FERRITIN: 132 ng/mL (ref 11–307)

## 2017-07-30 ENCOUNTER — Other Ambulatory Visit (HOSPITAL_COMMUNITY): Payer: Self-pay

## 2017-07-30 DIAGNOSIS — M5136 Other intervertebral disc degeneration, lumbar region: Secondary | ICD-10-CM | POA: Diagnosis not present

## 2017-07-30 DIAGNOSIS — M533 Sacrococcygeal disorders, not elsewhere classified: Secondary | ICD-10-CM | POA: Diagnosis not present

## 2017-08-03 ENCOUNTER — Telehealth: Payer: Self-pay | Admitting: Family Medicine

## 2017-08-03 NOTE — Telephone Encounter (Signed)
Patient called at 5:30 AM with multiple complaints.  Her primary complaint was abdominal pain ever since she had taken a Medrol Dosepak.  She states the Medrol Dosepak was given to her by her pain provider because she has "pain in my tailbone" and numbness from the waist down since my breast surgery.  She also states that she has had bariatric surgery.  She was eating less but able to drink water and protein shakes.  She had not tried anything for the pain.  I advised her to use Maalox or Mylanta liquid.  I advised her she could take Prilosec 20 mg a day over-the-counter.  I advised her that she is Monday if she was not feeling better or go to the emergency room if she felt she needed to over the weekend.  I did inquire why she felt the need to call me at 5:30 in the morning for pain that have been going on for a week.  She laughed and said "oh I always do that".

## 2017-08-04 ENCOUNTER — Emergency Department (HOSPITAL_COMMUNITY)
Admission: EM | Admit: 2017-08-04 | Discharge: 2017-08-04 | Disposition: A | Discharge status: Home / Self Care | Payer: PPO | Attending: Emergency Medicine | Admitting: Emergency Medicine

## 2017-08-04 ENCOUNTER — Other Ambulatory Visit: Payer: Self-pay

## 2017-08-04 ENCOUNTER — Encounter (HOSPITAL_COMMUNITY): Payer: Self-pay | Admitting: *Deleted

## 2017-08-04 ENCOUNTER — Emergency Department (HOSPITAL_COMMUNITY): Payer: PPO

## 2017-08-04 DIAGNOSIS — R0789 Other chest pain: Secondary | ICD-10-CM | POA: Diagnosis not present

## 2017-08-04 DIAGNOSIS — Z87891 Personal history of nicotine dependence: Secondary | ICD-10-CM | POA: Diagnosis not present

## 2017-08-04 DIAGNOSIS — Z79899 Other long term (current) drug therapy: Secondary | ICD-10-CM | POA: Diagnosis not present

## 2017-08-04 DIAGNOSIS — R079 Chest pain, unspecified: Secondary | ICD-10-CM | POA: Diagnosis not present

## 2017-08-04 DIAGNOSIS — R072 Precordial pain: Secondary | ICD-10-CM | POA: Diagnosis not present

## 2017-08-04 HISTORY — DX: Anorexia: R63.0

## 2017-08-04 LAB — CBC
HCT: 46.4 % — ABNORMAL HIGH (ref 36.0–46.0)
HEMOGLOBIN: 15.2 g/dL — AB (ref 12.0–15.0)
MCH: 32.6 pg (ref 26.0–34.0)
MCHC: 32.8 g/dL (ref 30.0–36.0)
MCV: 99.6 fL (ref 78.0–100.0)
Platelets: 342 10*3/uL (ref 150–400)
RBC: 4.66 MIL/uL (ref 3.87–5.11)
RDW: 12.7 % (ref 11.5–15.5)
WBC: 9.6 10*3/uL (ref 4.0–10.5)

## 2017-08-04 LAB — BASIC METABOLIC PANEL
ANION GAP: 13 (ref 5–15)
BUN: 20 mg/dL (ref 6–20)
CALCIUM: 9.8 mg/dL (ref 8.9–10.3)
CO2: 23 mmol/L (ref 22–32)
Chloride: 106 mmol/L (ref 101–111)
Creatinine, Ser: 0.89 mg/dL (ref 0.44–1.00)
GFR calc Af Amer: >60 mL/min (ref >60)
GFR calc non Af Amer: >60 mL/min (ref >60)
GLUCOSE: 109 mg/dL — AB (ref 65–99)
Potassium: 3.2 mmol/L — ABNORMAL LOW (ref 3.5–5.1)
Sodium: 142 mmol/L (ref 135–145)

## 2017-08-04 LAB — D-DIMER, QUANTITATIVE: D-Dimer, Quant: 0.27 ug/mL-FEU (ref 0.00–0.50)

## 2017-08-04 LAB — TROPONIN I

## 2017-08-04 MED ORDER — HYDROCODONE-ACETAMINOPHEN 5-325 MG PO TABS
1.0000 | ORAL_TABLET | Freq: Once | ORAL | Status: DC
  Filled 2017-08-04: qty 1

## 2017-08-04 MED ORDER — TRAMADOL HCL 50 MG PO TABS: 50.0000 mg | ORAL_TABLET | Freq: Four times a day (QID) | ORAL | 0 refills | Status: DC | PRN

## 2017-08-04 MED ORDER — POTASSIUM CHLORIDE CRYS ER 20 MEQ PO TBCR
40.0000 meq | EXTENDED_RELEASE_TABLET | Freq: Once | ORAL | Status: AC
  Administered 2017-08-04: 40 meq via ORAL
  Filled 2017-08-04: qty 2

## 2017-08-04 NOTE — Discharge Instructions (Signed)
Follow up with your family md next week. °

## 2017-08-04 NOTE — ED Provider Notes (Signed)
[facility] EMERGENCY DEPARTMENT Provider Note   CSN: [id number] Arrival date & time: 08/04/17  4098     History   Chief Complaint Chief Complaint  Patient presents with  . Chest Pain    HPI Katelyn [name] is a 42 y.o. female.  Patient complains of left-sided chest discomfort that has been lasting continuously since yesterday afternoon.  No pain with inspiration  The history is provided by the patient.  Chest Pain   This is a new problem. The current episode started 12 to 24 hours ago. The problem occurs constantly. The problem has not changed since onset.The pain is associated with movement. The pain is present in the substernal region. The pain is at a severity of 5/10. The pain is moderate. The quality of the pain is described as pressure-like and stabbing. The pain radiates to the left neck. Pertinent negatives include no abdominal pain, no back pain, no cough and no headaches.  Pertinent negatives for past medical history include no seizures.    Past Medical History:  Diagnosis Date  . Anorexia   . Anxiety   . Benign juvenile melanoma   . Chronic headaches   . Colon polyps    found on colonoscopy 04/26/2012  . Complication of anesthesia    itching after epidural for c section  . Constipation   . Depression   . Depression    several suicide attempts, hospitaluzed in 2012 for this; hx pf ECT treatments ; pt sees Dr. Adele Schilder pyschiatrist  and doing well on medication  . Headache   . Heart murmur    as a child - no one mentions hearing murmur anymore  . Heartburn    no meds  . History of pneumonia    x 3  years ago - no recent problems  . Obesity   . Pneumonia   . Polycystic ovary    takes metformin to treat  . Skin lesion    Excisional biopsy of moles - none cancerous  . Sleep apnea    yrs ago - diagnosed mild sleep apnea - did not have to use cpap     Patient Active Problem List   Diagnosis Date Noted  . Open breast wound, left, initial encounter  04/20/2017  . Neuropathic pain of both legs 03/12/2017  . Sacral pain 03/01/2017  . Bariatric surgery status 05/02/2016  . Major depressive disorder, recurrent episode, moderate ([place]) 02/18/2016  . Iron deficiency anemia 06/10/2015  . Sleep disorder 06/01/2013  . Generalized anxiety disorder 05/30/2013  . Chronic headaches 07/01/2012  . Depression, major, recurrent, severe with psychosis ([place]) 06/17/2012  . Family hx colonic polyps 03/03/2012  . Eating disorder 12/18/2011  . POLYCYSTIC OVARIAN DISEASE 09/01/2008    Past Surgical History:  Procedure Laterality Date  . BREAST ENHANCEMENT SURGERY Bilateral 02/11/2017  . BREATH TEK H PYLORI N/A 07/22/2013   Procedure: BREATH TEK H PYLORI;  Surgeon: Edward Jolly, MD;  Location: [facility];  Service: General;  Laterality: N/A;  . c sections  08/28/14   x 2  . CESAREAN SECTION  2004, 2007   x 2  . CYSTOSCOPY WITH URETHRAL DILATATION  age 69   . GASTRIC ROUX-EN-Y N/A 10/31/2013   Procedure: LAPAROSCOPIC ROUX-EN-Y GASTRIC BYPASS WITH UPPER ENDOSCOPY;  Surgeon: Edward Jolly, MD;  Location: [facility];  Service: General;  Laterality: N/A;  . kidney stent  08/28/14  . mole excision     "benign juvenile melanoma" removed from left leg - inner  thigh  . ROUX-EN-Y PROCEDURE  08/28/14  . SKIN LESION EXCISION     back  . TONSILLECTOMY    . TONSILLECTOMY  age 47  . TUBAL LIGATION       OB History    Gravida  0   Para  0   Term  0   Preterm  0   AB  0   Living        SAB  0   TAB  0   Ectopic  0   Multiple      Live Births               Home Medications    Prior to Admission medications   Medication Sig Start Date End Date Taking? Authorizing Provider  buPROPion (WELLBUTRIN) 75 MG tablet Take 2 tab twice a day 07/20/17   Arfeen, Arlyce Harman, MD  clonazePAM (KLONOPIN) 0.5 MG tablet Take 1 tablet (0.5 mg total) by mouth 2 (two) times daily. 07/20/17 07/20/18  Arfeen, Arlyce Harman, MD  conjugated estrogens (PREMARIN) vaginal  cream Place 1 Applicatorful vaginally daily.    [provider]  Cyanocobalamin (B-12) 1000 MCG SUBL Place 1,000 mcg under the tongue daily. 01/01/16   Holley Bouche, NP  cyclobenzaprine (FLEXERIL) 5 MG tablet One tablet evey 8 hours as needed for headache 12/08/16   Fayrene Helper, MD  FLUoxetine (PROZAC) 20 MG tablet Take 1 tablet (20 mg total) by mouth daily. 04/16/17   Arfeen, Arlyce Harman, MD  gabapentin (NEURONTIN) 300 MG capsule Take 4 capsules 3 x a day.  (1200 mg tid) 05/22/17   Narda Amber K, DO  Multiple Vitamin (MULTIVITAMIN WITH MINERALS) TABS tablet Take 1 tablet by mouth 2 (two) times daily.     [provider]  ondansetron (ZOFRAN) 4 MG tablet One tablet as needed, for headache 12/08/16   Fayrene Helper, MD  temazepam (RESTORIL) 22.5 MG capsule Take 1 capsule (22.5 mg total) by mouth at bedtime as needed for sleep. 07/20/17   Arfeen, Arlyce Harman, MD  tinidazole (TINDAMAX) 500 MG tablet TAKE 4 TABLETS PO DAILY 12/09/16   [provider]  topiramate (TOPAMAX) 100 MG tablet Take 1 tablet (100 mg total) by mouth 2 (two) times daily. 12/08/16   Fayrene Helper, MD  traMADol (ULTRAM) 50 MG tablet Take 1 tablet (50 mg total) by mouth every 6 (six) hours as needed. 08/04/17   Milton Ferguson, MD  triamcinolone ointment (KENALOG) 0.1 %  11/21/16   [provider]  ziprasidone (GEODON) 60 MG capsule Take 1 capsule (60 mg total) by mouth at bedtime. 07/07/17   Arfeen, Arlyce Harman, MD    Family History Family History  Problem Relation Age of Onset  . Hypercholesterolemia Mother   . Hypertension Mother        Iterstitial Cystist  . Hyperlipidemia Mother   . Cancer Mother 61       breast   . Depression Brother   . Alcohol abuse Brother   . Colon polyps Father   . Depression Father   . Irritable bowel syndrome Father   . Alcohol abuse Father   . Colon cancer Paternal Aunt 2  . Heart attack Paternal Grandfather   . Kidney cancer Paternal Grandfather   .  Cancer Maternal Grandfather        unknown type    Social History Social History   Tobacco Use  . Smoking status: Former Smoker    Packs/day: 1.00  Years: 5.00    Pack years: 5.00    Types: Cigarettes    Last attempt to quit: 08/26/1997    Years since quitting: 19.9  . Smokeless tobacco: Never Used  Substance Use Topics  . Alcohol use: No    Alcohol/week: 0.0 oz  . Drug use: No     Allergies   Lyrica [pregabalin]; Feraheme [ferumoxytol]; and Penicillins   Review of Systems Review of Systems  Constitutional: Negative for appetite change and fatigue.  HENT: Negative for congestion, ear discharge and sinus pressure.   Eyes: Negative for discharge.  Respiratory: Negative for cough.   Cardiovascular: Positive for chest pain.  Gastrointestinal: Negative for abdominal pain and diarrhea.  Genitourinary: Negative for frequency and hematuria.  Musculoskeletal: Negative for back pain.  Skin: Negative for rash.  Neurological: Negative for seizures and headaches.  Psychiatric/Behavioral: Negative for hallucinations.     Physical Exam Updated Vital Signs BP 123/73 (BP Location: Right Arm)   Pulse 75   Temp 98.3 F (36.8 C) (Oral)   Resp 18   Ht 5' 5.5" (1.664 m)   Wt 57.6 kg (127 lb)   LMP 06/30/2017 Comment: tubal ligation  SpO2 98%   BMI 20.81 kg/m   Physical Exam  Constitutional: She is oriented to person, place, and time. She appears well-developed.  HENT:  Head: Normocephalic.  Eyes: Conjunctivae and EOM are normal. No scleral icterus.  Neck: Neck supple. No thyromegaly present.  Cardiovascular: Normal rate and regular rhythm. Exam reveals no gallop and no friction rub.  No murmur heard. Pulmonary/Chest: No stridor. She has no wheezes. She has no rales. She exhibits no tenderness.  Abdominal: She exhibits no distension. There is no tenderness. There is no rebound.  Musculoskeletal: Normal range of motion. She exhibits no edema.  Lymphadenopathy:    She has  no cervical adenopathy.  Neurological: She is oriented to person, place, and time. She exhibits normal muscle tone. Coordination normal.  Skin: No rash noted. No erythema.  Psychiatric: She has a normal mood and affect. Her behavior is normal.     ED Treatments / Results  Labs (all labs ordered are listed, but only abnormal results are displayed) Labs Reviewed  BASIC METABOLIC PANEL - Abnormal; Notable for the following components:      Result Value   Potassium 3.2 (*)    Glucose, Bld 109 (*)    All other components within normal limits  CBC - Abnormal; Notable for the following components:   Hemoglobin 15.2 (*)    HCT 46.4 (*)    All other components within normal limits  TROPONIN I  D-DIMER, QUANTITATIVE (NOT AT [facility])    EKG EKG Interpretation  Date/Time:  Tuesday August 04 2017 10:53:47 EDT Ventricular Rate:  72 PR Interval:  152 QRS Duration: 70 QT Interval:  370 QTC Calculation: 405 R Axis:   78 Text Interpretation:  Normal sinus rhythm Right atrial enlargement Cannot rule out Anterior infarct , age undetermined Abnormal ECG Confirmed by Milton Ferguson [phone]) on 08/04/2017 12:24:54 PM   Radiology Dg Chest 2 View  Result Date: 08/04/2017 CLINICAL DATA:  Chest and left arm and jaw pain for 1 day, slight nausea, history of eating disorder EXAM: CHEST - 2 VIEW COMPARISON:  Chest x-ray of 12/09/2016 FINDINGS: No active infiltrate or effusion is seen. Mediastinal and hilar contours are unremarkable. The heart is within normal limits in size. Breast implants are present. No bony abnormality is seen. IMPRESSION: No active cardiopulmonary disease. Electronically Signed  By: Ivar Drape M.D.   On: 08/04/2017 11:13    Procedures Procedures (including critical care time)  Medications Ordered in ED Medications  potassium chloride SA (K-DUR,KLOR-CON) CR tablet 40 mEq (40 mEq Oral Given 08/04/17 1325)     Initial Impression / Assessment and Plan / ED Course  I have reviewed the  triage vital signs and the nursing notes.  Pertinent labs & imaging results that were available during my care of the patient were reviewed by me and considered in my medical decision making (see chart for details).     Patient with left-sided chest pain.  It does not seem to be cardiac lab tests unremarkable troponin negative x1 patient refused second troponin chest x-ray unremarkable and EKG showed no acute changes.  Patient given some Ultram and she will follow-up with her PCP  Final Clinical Impressions(s) / ED Diagnoses   Final diagnoses:  Atypical chest pain    ED Discharge Orders        Ordered    traMADol (ULTRAM) 50 MG tablet  Every 6 hours PRN     08/04/17 1335       Milton Ferguson, MD 08/04/17 1341

## 2017-08-04 NOTE — ED Notes (Signed)
Patient states she is ready to go home because she is unable to lay on stretcher or sit in chair due to chronic pain. Patient aware of ordered medication and labs. Patient states that she does not wish to have second troponin drawn but will wait for d-dimer results. EDP aware

## 2017-08-04 NOTE — ED Triage Notes (Signed)
Pt c/o left sided chest pain, left jaw pain, left arm numbness/tingling, dizziness and nausea that started last night about 1700. Denies SOB, vomiting.

## 2017-08-06 ENCOUNTER — Other Ambulatory Visit: Payer: Self-pay

## 2017-08-06 ENCOUNTER — Encounter (HOSPITAL_COMMUNITY): Payer: Self-pay | Admitting: Internal Medicine

## 2017-08-06 ENCOUNTER — Inpatient Hospital Stay (HOSPITAL_BASED_OUTPATIENT_CLINIC_OR_DEPARTMENT_OTHER): Payer: PPO | Admitting: Internal Medicine

## 2017-08-06 VITALS — BP 127/77 | HR 80 | Temp 97.6°F | Resp 16 | Wt 126.4 lb

## 2017-08-06 DIAGNOSIS — K909 Intestinal malabsorption, unspecified: Secondary | ICD-10-CM

## 2017-08-06 DIAGNOSIS — Z87891 Personal history of nicotine dependence: Secondary | ICD-10-CM | POA: Diagnosis not present

## 2017-08-06 DIAGNOSIS — E876 Hypokalemia: Secondary | ICD-10-CM

## 2017-08-06 DIAGNOSIS — D508 Other iron deficiency anemias: Secondary | ICD-10-CM

## 2017-08-06 DIAGNOSIS — Z9884 Bariatric surgery status: Secondary | ICD-10-CM | POA: Diagnosis not present

## 2017-08-06 DIAGNOSIS — D500 Iron deficiency anemia secondary to blood loss (chronic): Secondary | ICD-10-CM

## 2017-08-06 NOTE — Progress Notes (Signed)
Diagnosis Iron deficiency anemia due to chronic blood loss - Plan: CBC with Differential/Platelet, Comprehensive metabolic panel, Lactate dehydrogenase, Ferritin, CBC with Differential/Platelet, Comprehensive metabolic panel, Lactate dehydrogenase, Ferritin  Staging Cancer Staging No matching staging information was found for the patient.  Assessment and Plan: 1.   Iron deficiency anemia.  Patient has undergone gastric bypass so she has difficulty absorbing oral iron.  She was recently treated with IV iron on May 05, 2017 with Injectafer.  Labs done 08/04/2017 show a hemoglobin 15.2.  Ferritin was 132 which is improved from 39 in January.  She will have repeat labs in July 2019.  She will follow-up in October 2019 with repeat labs.  2.  Nutrient deficiency secondary to gastric bypass.  Continue B12 as directed.  3.  GI symptoms.  Follow-up with GI and gastric bypass surgeon as directed.  4.  Hypokalemia.  Potassium was slightly decreased at 3.2.  She reports she was recently treated with IV potassium.  She denies any diarrhea.  Potassium rich foods are recommended.  5.  Health maintenance.  Follow-up with GI as recommended.  Mammogram evaluation is recommended.  Interval History:   (From Dr. Donald Pore note on 02/01/16)    Current Status: Patient is seen for follow-up.  She is here today to go over her labs.  She reports back pain, she has ongoing GI complaints.     Problem List Patient Active Problem List   Diagnosis Date Noted  . Open breast wound, left, initial encounter [S21.002A] 04/20/2017  . Neuropathic pain of both legs [G57.93] 03/12/2017  . Sacral pain [M53.3] 03/01/2017  . Bariatric surgery status [Z98.84] 05/02/2016  . Major depressive disorder, recurrent episode, moderate ([place]) [F33.1] 02/18/2016  . Iron deficiency anemia [D50.9] 06/10/2015  . Sleep disorder [G47.9] 06/01/2013  . Generalized anxiety disorder [F41.1] 05/30/2013  . Chronic headaches [R51] 07/01/2012    . Depression, major, recurrent, severe with psychosis ([place]) [F33.3] 06/17/2012  . Family hx colonic polyps [Z83.71] 03/03/2012  . Eating disorder [F50.9] 12/18/2011  . POLYCYSTIC OVARIAN DISEASE [E28.2] 09/01/2008    Past Medical History Past Medical History:  Diagnosis Date  . Anorexia   . Anxiety   . Benign juvenile melanoma   . Chronic headaches   . Colon polyps    found on colonoscopy 04/26/2012  . Complication of anesthesia    itching after epidural for c section  . Constipation   . Depression   . Depression    several suicide attempts, hospitaluzed in 2012 for this; hx pf ECT treatments ; pt sees Dr. Adele Schilder pyschiatrist  and doing well on medication  . Headache   . Heart murmur    as a child - no one mentions hearing murmur anymore  . Heartburn    no meds  . History of pneumonia    x 3  years ago - no recent problems  . Obesity   . Pneumonia   . Polycystic ovary    takes metformin to treat  . Skin lesion    Excisional biopsy of moles - none cancerous  . Sleep apnea    yrs ago - diagnosed mild sleep apnea - did not have to use cpap     Past Surgical History Past Surgical History:  Procedure Laterality Date  . BREAST ENHANCEMENT SURGERY Bilateral 02/11/2017  . BREATH TEK H PYLORI N/A 07/22/2013   Procedure: BREATH TEK H PYLORI;  Surgeon: Edward Jolly, MD;  Location: [facility];  Service: General;  Laterality: N/A;  .  c sections  08/28/14   x 2  . CESAREAN SECTION  2004, 2007   x 2  . CYSTOSCOPY WITH URETHRAL DILATATION  age 42   . GASTRIC ROUX-EN-Y N/A 10/31/2013   Procedure: LAPAROSCOPIC ROUX-EN-Y GASTRIC BYPASS WITH UPPER ENDOSCOPY;  Surgeon: Edward Jolly, MD;  Location: [facility];  Service: General;  Laterality: N/A;  . kidney stent  08/28/14  . mole excision     "benign juvenile melanoma" removed from left leg - inner thigh  . ROUX-EN-Y PROCEDURE  08/28/14  . SKIN LESION EXCISION     back  . TONSILLECTOMY    . TONSILLECTOMY  age 69  . TUBAL  LIGATION      Family History Family History  Problem Relation Age of Onset  . Hypercholesterolemia Mother   . Hypertension Mother        Iterstitial Cystist  . Hyperlipidemia Mother   . Cancer Mother 7       breast   . Depression Brother   . Alcohol abuse Brother   . Colon polyps Father   . Depression Father   . Irritable bowel syndrome Father   . Alcohol abuse Father   . Colon cancer Paternal Aunt 64  . Heart attack Paternal Grandfather   . Kidney cancer Paternal Grandfather   . Cancer Maternal Grandfather        unknown type     Social History  reports that she quit smoking about 19 years ago. Her smoking use included cigarettes. She has a 5.00 pack-year smoking history. She has never used smokeless tobacco. She reports that she does not drink alcohol or use drugs.  Medications  Current Outpatient Medications:  .  buPROPion (WELLBUTRIN) 75 MG tablet, Take 2 tab twice a day, Disp: 120 tablet, Rfl: 1 .  clonazePAM (KLONOPIN) 0.5 MG tablet, Take 1 tablet (0.5 mg total) by mouth 2 (two) times daily., Disp: 60 tablet, Rfl: 1 .  conjugated estrogens (PREMARIN) vaginal cream, Place 1 Applicatorful vaginally daily., Disp: , Rfl:  .  Cyanocobalamin (B-12) 1000 MCG SUBL, Place 1,000 mcg under the tongue daily., Disp: 30 each, Rfl: 6 .  cyclobenzaprine (FLEXERIL) 5 MG tablet, One tablet evey 8 hours as needed for headache, Disp: 30 tablet, Rfl: 5 .  FLUoxetine (PROZAC) 20 MG tablet, Take 1 tablet (20 mg total) by mouth daily., Disp: 30 tablet, Rfl: 1 .  gabapentin (NEURONTIN) 300 MG capsule, Take 4 capsules 3 x a day.  (1200 mg tid), Disp: 360 capsule, Rfl: 5 .  Multiple Vitamin (MULTIVITAMIN WITH MINERALS) TABS tablet, Take 1 tablet by mouth 2 (two) times daily. , Disp: , Rfl:  .  ondansetron (ZOFRAN) 4 MG tablet, One tablet as needed, for headache, Disp: 30 tablet, Rfl: 5 .  temazepam (RESTORIL) 22.5 MG capsule, Take 1 capsule (22.5 mg total) by mouth at bedtime as needed for  sleep., Disp: 30 capsule, Rfl: 1 .  tinidazole (TINDAMAX) 500 MG tablet, TAKE 4 TABLETS PO DAILY, Disp: , Rfl: 0 .  topiramate (TOPAMAX) 100 MG tablet, Take 1 tablet (100 mg total) by mouth 2 (two) times daily., Disp: 60 tablet, Rfl: 5 .  traMADol (ULTRAM) 50 MG tablet, Take 1 tablet (50 mg total) by mouth every 6 (six) hours as needed., Disp: 15 tablet, Rfl: 0 .  triamcinolone ointment (KENALOG) 0.1 %, , Disp: , Rfl:  .  ziprasidone (GEODON) 60 MG capsule, Take 1 capsule (60 mg total) by mouth at bedtime., Disp: 30 capsule, Rfl: 1  Allergies Lyrica [pregabalin]; Feraheme [ferumoxytol]; and Penicillins  Review of Systems Review of Systems - Oncology ROS as per HPI otherwise 12 point ROS is negative.   Physical Exam  Vitals Wt Readings from Last 3 Encounters:  08/06/17 126 lb 6.4 oz (57.3 kg)  08/04/17 127 lb (57.6 kg)  05/05/17 132 lb (59.9 kg)   Temp Readings from Last 3 Encounters:  08/06/17 97.6 F (36.4 C) (Oral)  08/04/17 98.3 F (36.8 C) (Oral)  05/05/17 98.4 F (36.9 C) (Oral)   BP Readings from Last 3 Encounters:  08/06/17 127/77  08/04/17 123/73  05/05/17 112/70   Pulse Readings from Last 3 Encounters:  08/06/17 80  08/04/17 75  05/05/17 71   Constitutional: Well-developed, well-nourished, and in no distress.   HENT: Head: Normocephalic and atraumatic.  Mouth/Throat: No oropharyngeal exudate. Mucosa moist. Eyes: Pupils are equal, round, and reactive to light. Conjunctivae are normal. No scleral icterus.  Neck: Normal range of motion. Neck supple. No JVD present.  Cardiovascular: Normal rate, regular rhythm and normal heart sounds.  Exam reveals no gallop and no friction rub.   No murmur heard. Pulmonary/Chest: Effort normal and breath sounds normal. No respiratory distress. No wheezes.No rales.  Abdominal: Soft. Bowel sounds are normal. No distension. There is no tenderness. There is no guarding.  Musculoskeletal:Tender in back area Lymphadenopathy: No  cervical, axillary or supraclavicular adenopathy.  Neurological: Alert and oriented to person, place, and time. No cranial nerve deficit.  Skin: Skin is warm and dry. No rash noted. No erythema. No pallor.  Psychiatric: Affect and judgment normal.   Labs Admission on 08/04/2017, Discharged on 08/04/2017  Component Date Value Ref Range Status  . Sodium 08/04/2017 142  135 - 145 mmol/L Final  . Potassium 08/04/2017 3.2* 3.5 - 5.1 mmol/L Final  . Chloride 08/04/2017 106  101 - 111 mmol/L Final  . CO2 08/04/2017 23  22 - 32 mmol/L Final  . Glucose, Bld 08/04/2017 109* 65 - 99 mg/dL Final  . BUN 08/04/2017 20  6 - 20 mg/dL Final  . Creatinine, Ser 08/04/2017 0.89  0.44 - 1.00 mg/dL Final  . Calcium 08/04/2017 9.8  8.9 - 10.3 mg/dL Final  . GFR calc non Af Amer 08/04/2017 >60  >60 mL/min Final  . GFR calc Af Amer 08/04/2017 >60  >60 mL/min Final   Comment: (NOTE) The eGFR has been calculated using the CKD EPI equation. This calculation has not been validated in all clinical situations. eGFR's persistently <60 mL/min signify possible Chronic Kidney Disease.   Georgiann Hahn gap 08/04/2017 13  5 - 15 Final   Performed at [facility], [address] [place], [place] [address]  . WBC 08/04/2017 9.6  4.0 - 10.5 K/uL Final  . RBC 08/04/2017 4.66  3.87 - 5.11 MIL/uL Final  . Hemoglobin 08/04/2017 15.2* 12.0 - 15.0 g/dL Final  . HCT 08/04/2017 46.4* 36.0 - 46.0 % Final  . MCV 08/04/2017 99.6  78.0 - 100.0 fL Final  . MCH 08/04/2017 32.6  26.0 - 34.0 pg Final  . MCHC 08/04/2017 32.8  30.0 - 36.0 g/dL Final  . RDW 08/04/2017 12.7  11.5 - 15.5 % Final  . Platelets 08/04/2017 342  150 - 400 K/uL Final   Performed at [facility], [address], [place], [place] [address]  . Troponin I 08/04/2017 <0.03  <0.03 ng/mL Final   Performed at [facility], [address] [place],  [address]  . D-Dimer, Quant 08/04/2017 0.27  0.00 -  0.50 ug/mL-FEU Final   Comment: (NOTE) At the manufacturer cut-off of  0.50 ug/mL FEU, this assay has been documented to exclude PE with a sensitivity and negative predictive value of 97 to 99%.  At this time, this assay has not been approved by the FDA to exclude DVT/VTE. Results should be correlated with clinical presentation. Performed at [facility], [address], [place], [place] [address]      Pathology Orders Placed This Encounter  Procedures  . CBC with Differential/Platelet    Standing Status:   Future    Standing Expiration Date:   08/07/2018  . Comprehensive metabolic panel    Standing Status:   Future    Standing Expiration Date:   08/07/2018  . Lactate dehydrogenase    Standing Status:   Future    Standing Expiration Date:   08/07/2018  . Ferritin    Standing Status:   Future    Standing Expiration Date:   08/07/2018  . CBC with Differential/Platelet    Standing Status:   Future    Standing Expiration Date:   08/07/2018  . Comprehensive metabolic panel    Standing Status:   Future    Standing Expiration Date:   08/07/2018  . Lactate dehydrogenase    Standing Status:   Future    Standing Expiration Date:   08/07/2018  . Ferritin    Standing Status:   Future    Standing Expiration Date:   08/07/2018       Zoila Shutter MD

## 2017-08-07 DIAGNOSIS — R630 Anorexia: Secondary | ICD-10-CM | POA: Diagnosis not present

## 2017-08-07 DIAGNOSIS — F411 Generalized anxiety disorder: Secondary | ICD-10-CM | POA: Diagnosis not present

## 2017-08-07 DIAGNOSIS — F3112 Bipolar disorder, current episode manic without psychotic features, moderate: Secondary | ICD-10-CM | POA: Diagnosis not present

## 2017-08-10 ENCOUNTER — Ambulatory Visit: Payer: Self-pay | Admitting: Family Medicine

## 2017-08-17 ENCOUNTER — Other Ambulatory Visit (HOSPITAL_COMMUNITY): Payer: Self-pay | Admitting: Psychiatry

## 2017-08-17 ENCOUNTER — Other Ambulatory Visit: Payer: Self-pay | Admitting: Plastic Surgery

## 2017-08-17 DIAGNOSIS — R52 Pain, unspecified: Secondary | ICD-10-CM

## 2017-08-17 DIAGNOSIS — F331 Major depressive disorder, recurrent, moderate: Secondary | ICD-10-CM

## 2017-08-20 DIAGNOSIS — Z9884 Bariatric surgery status: Secondary | ICD-10-CM | POA: Diagnosis not present

## 2017-08-23 ENCOUNTER — Other Ambulatory Visit: Payer: Self-pay | Admitting: Plastic Surgery

## 2017-08-23 ENCOUNTER — Ambulatory Visit
Admission: RE | Admit: 2017-08-23 | Discharge: 2017-08-23 | Disposition: A | Discharge status: Home / Self Care | Payer: PPO | Source: Ambulatory Visit | Attending: Plastic Surgery | Admitting: Plastic Surgery

## 2017-08-23 DIAGNOSIS — R52 Pain, unspecified: Secondary | ICD-10-CM

## 2017-08-23 DIAGNOSIS — D250 Submucous leiomyoma of uterus: Secondary | ICD-10-CM | POA: Diagnosis not present

## 2017-08-25 ENCOUNTER — Ambulatory Visit: Payer: Self-pay | Admitting: Family Medicine

## 2017-08-28 DIAGNOSIS — G894 Chronic pain syndrome: Secondary | ICD-10-CM | POA: Diagnosis not present

## 2017-08-28 DIAGNOSIS — M533 Sacrococcygeal disorders, not elsewhere classified: Secondary | ICD-10-CM | POA: Diagnosis not present

## 2017-08-28 DIAGNOSIS — M47892 Other spondylosis, cervical region: Secondary | ICD-10-CM | POA: Diagnosis not present

## 2017-08-28 DIAGNOSIS — M47812 Spondylosis without myelopathy or radiculopathy, cervical region: Secondary | ICD-10-CM | POA: Diagnosis not present

## 2017-08-28 DIAGNOSIS — R102 Pelvic and perineal pain: Secondary | ICD-10-CM | POA: Diagnosis not present

## 2017-08-28 DIAGNOSIS — G8929 Other chronic pain: Secondary | ICD-10-CM | POA: Diagnosis not present

## 2017-08-28 DIAGNOSIS — M47816 Spondylosis without myelopathy or radiculopathy, lumbar region: Secondary | ICD-10-CM | POA: Diagnosis not present

## 2017-08-31 ENCOUNTER — Ambulatory Visit (INDEPENDENT_AMBULATORY_CARE_PROVIDER_SITE_OTHER): Payer: PPO | Admitting: Psychiatry

## 2017-08-31 ENCOUNTER — Encounter (HOSPITAL_COMMUNITY): Payer: Self-pay | Admitting: Psychiatry

## 2017-08-31 DIAGNOSIS — Z560 Unemployment, unspecified: Secondary | ICD-10-CM | POA: Diagnosis not present

## 2017-08-31 DIAGNOSIS — F509 Eating disorder, unspecified: Secondary | ICD-10-CM | POA: Diagnosis not present

## 2017-08-31 DIAGNOSIS — Z818 Family history of other mental and behavioral disorders: Secondary | ICD-10-CM

## 2017-08-31 DIAGNOSIS — F411 Generalized anxiety disorder: Secondary | ICD-10-CM | POA: Diagnosis not present

## 2017-08-31 DIAGNOSIS — M549 Dorsalgia, unspecified: Secondary | ICD-10-CM | POA: Diagnosis not present

## 2017-08-31 DIAGNOSIS — F331 Major depressive disorder, recurrent, moderate: Secondary | ICD-10-CM | POA: Diagnosis not present

## 2017-08-31 DIAGNOSIS — Z811 Family history of alcohol abuse and dependence: Secondary | ICD-10-CM

## 2017-08-31 DIAGNOSIS — Z87891 Personal history of nicotine dependence: Secondary | ICD-10-CM | POA: Diagnosis not present

## 2017-08-31 MED ORDER — BUPROPION HCL 75 MG PO TABS: ORAL_TABLET | ORAL | 1 refills | Status: DC

## 2017-08-31 MED ORDER — TEMAZEPAM 22.5 MG PO CAPS: 22.5000 mg | ORAL_CAPSULE | Freq: Every evening | ORAL | 1 refills | Status: DC | PRN

## 2017-08-31 MED ORDER — ZIPRASIDONE HCL 60 MG PO CAPS: 60.0000 mg | ORAL_CAPSULE | Freq: Every day | ORAL | 1 refills | Status: DC

## 2017-08-31 MED ORDER — CLONAZEPAM 0.5 MG PO TABS: 0.5000 mg | ORAL_TABLET | Freq: Two times a day (BID) | ORAL | 1 refills | Status: DC
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
## 2017-09-02 DIAGNOSIS — N83202 Unspecified ovarian cyst, left side: Secondary | ICD-10-CM | POA: Diagnosis not present

## 2017-09-02 DIAGNOSIS — R300 Dysuria: Secondary | ICD-10-CM | POA: Diagnosis not present

## 2017-09-02 DIAGNOSIS — N926 Irregular menstruation, unspecified: Secondary | ICD-10-CM | POA: Diagnosis not present

## 2017-09-02 DIAGNOSIS — N898 Other specified noninflammatory disorders of vagina: Secondary | ICD-10-CM | POA: Diagnosis not present

## 2017-09-17 DIAGNOSIS — F411 Generalized anxiety disorder: Secondary | ICD-10-CM | POA: Diagnosis not present

## 2017-09-17 DIAGNOSIS — R630 Anorexia: Secondary | ICD-10-CM | POA: Diagnosis not present

## 2017-09-17 DIAGNOSIS — F3112 Bipolar disorder, current episode manic without psychotic features, moderate: Secondary | ICD-10-CM | POA: Diagnosis not present

## 2017-09-17 DIAGNOSIS — M65311 Trigger thumb, right thumb: Secondary | ICD-10-CM | POA: Diagnosis not present

## 2017-09-30 ENCOUNTER — Telehealth: Payer: Self-pay | Admitting: Family Medicine

## 2017-09-30 ENCOUNTER — Telehealth: Payer: Self-pay | Admitting: *Deleted

## 2017-09-30 NOTE — Telephone Encounter (Signed)
I have sch the pt for 6-13

## 2017-09-30 NOTE — Telephone Encounter (Signed)
Reply to patient email complaining of symptoms:   Harshitha, I just spoke with Dr. Moshe Cipro and she is not able to see you today. She advised you do go to either Urgent Care or the emergency department and she will follow up with you after that visit early next week. I agree that you deserve to be seen immediately, given your symptoms. I know it's not ideal, but I advise you do go to the emergency department. She and I both want you to get checked out ASAP. You need a full workup and she can't do that today, but you do need to be seen.  I want your heart rate, temperature, blood pressure, potassium, calcium, phosphorus, glucose, and magnesium checked ASAP.  If you are dehydrated from diarrhea and having cardiac abnormalities, we need you to get checked out      Not included in that email, but included here: Requirements for hospitalization include: Adults: HR <40 bpm; BP <90/60 mmHg;gluc<60 mg/dl; K+<3 mEq; electrolyte imbalance; temp <97.68F; dehydration; hepatic, renal , or cardiovascular organ compromise requiring acute treatment; poorly controlled diabetes

## 2017-09-30 NOTE — Telephone Encounter (Signed)
Please call and  offer appt next week for this pt with me , I did tell the dietitian that I would work her in one day next week, BUT THAT my advice was that she go to urgent care or ED today with the  Concerns she voiced on the tele call today

## 2017-09-30 NOTE — Telephone Encounter (Signed)
I spoke with dietician, concern is pt c/o diarrhea and slow heart rate and poor appetite for days. Request eas for an apopt to be seen today. I explained that I have no availability today and that normall this pt has tachycardiia , she may well have electrolyte imbalance and since the complaint is over severl days / weeks , I advise urgent care / ED eval to ensure no electrolyte imbalance today, and I will see her one day next week in follow up

## 2017-09-30 NOTE — Telephone Encounter (Signed)
Patient emailed RD complaining of fatigue, low heart rate, diarrhea.  Patient concerned about possibility of refeeding syndrome as she has severe anorexia and recently started to try and eat more adequate again.  Patient has appointment scheduled with PCP for 6/19 but this provider is concerned that isn't soon enough.  Spoke with PCP who advised patient go to urgent care or emergency room today

## 2017-09-30 NOTE — Telephone Encounter (Signed)
Dietician Mickel Baas calling in with concerns of PT. Transferred the call to Dr Moshe Cipro

## 2017-10-05 ENCOUNTER — Encounter (HOSPITAL_COMMUNITY): Payer: Self-pay | Admitting: Emergency Medicine

## 2017-10-05 ENCOUNTER — Ambulatory Visit (HOSPITAL_COMMUNITY)
Admission: EM | Admit: 2017-10-05 | Discharge: 2017-10-05 | Disposition: A | Discharge status: Home / Self Care | Payer: PPO | Attending: Family Medicine | Admitting: Family Medicine

## 2017-10-05 DIAGNOSIS — F331 Major depressive disorder, recurrent, moderate: Secondary | ICD-10-CM | POA: Diagnosis not present

## 2017-10-05 DIAGNOSIS — Z803 Family history of malignant neoplasm of breast: Secondary | ICD-10-CM | POA: Diagnosis not present

## 2017-10-05 DIAGNOSIS — Z86010 Personal history of colonic polyps: Secondary | ICD-10-CM | POA: Diagnosis not present

## 2017-10-05 DIAGNOSIS — Z888 Allergy status to other drugs, medicaments and biological substances status: Secondary | ICD-10-CM | POA: Insufficient documentation

## 2017-10-05 DIAGNOSIS — Z7984 Long term (current) use of oral hypoglycemic drugs: Secondary | ICD-10-CM | POA: Diagnosis not present

## 2017-10-05 DIAGNOSIS — Z811 Family history of alcohol abuse and dependence: Secondary | ICD-10-CM | POA: Diagnosis not present

## 2017-10-05 DIAGNOSIS — Z8371 Family history of colonic polyps: Secondary | ICD-10-CM | POA: Insufficient documentation

## 2017-10-05 DIAGNOSIS — Z79899 Other long term (current) drug therapy: Secondary | ICD-10-CM | POA: Diagnosis not present

## 2017-10-05 DIAGNOSIS — G4730 Sleep apnea, unspecified: Secondary | ICD-10-CM | POA: Insufficient documentation

## 2017-10-05 DIAGNOSIS — Z85820 Personal history of malignant melanoma of skin: Secondary | ICD-10-CM | POA: Diagnosis not present

## 2017-10-05 DIAGNOSIS — Z87891 Personal history of nicotine dependence: Secondary | ICD-10-CM | POA: Diagnosis not present

## 2017-10-05 DIAGNOSIS — Z818 Family history of other mental and behavioral disorders: Secondary | ICD-10-CM | POA: Insufficient documentation

## 2017-10-05 DIAGNOSIS — M791 Myalgia, unspecified site: Secondary | ICD-10-CM | POA: Diagnosis not present

## 2017-10-05 DIAGNOSIS — Z9889 Other specified postprocedural states: Secondary | ICD-10-CM | POA: Insufficient documentation

## 2017-10-05 DIAGNOSIS — R636 Underweight: Secondary | ICD-10-CM | POA: Diagnosis not present

## 2017-10-05 DIAGNOSIS — E282 Polycystic ovarian syndrome: Secondary | ICD-10-CM | POA: Diagnosis not present

## 2017-10-05 DIAGNOSIS — Z9884 Bariatric surgery status: Secondary | ICD-10-CM | POA: Diagnosis not present

## 2017-10-05 DIAGNOSIS — F411 Generalized anxiety disorder: Secondary | ICD-10-CM | POA: Diagnosis not present

## 2017-10-05 DIAGNOSIS — Z8249 Family history of ischemic heart disease and other diseases of the circulatory system: Secondary | ICD-10-CM | POA: Insufficient documentation

## 2017-10-05 DIAGNOSIS — Z880 Allergy status to penicillin: Secondary | ICD-10-CM | POA: Diagnosis not present

## 2017-10-05 LAB — COMPREHENSIVE METABOLIC PANEL
ALK PHOS: 56 U/L (ref 38–126)
ALT: 29 U/L (ref 14–54)
ANION GAP: 6 (ref 5–15)
AST: 25 U/L (ref 15–41)
Albumin: 4 g/dL (ref 3.5–5.0)
BUN: 11 mg/dL (ref 6–20)
CO2: 27 mmol/L (ref 22–32)
CREATININE: 0.82 mg/dL (ref 0.44–1.00)
Calcium: 8.9 mg/dL (ref 8.9–10.3)
Chloride: 112 mmol/L — ABNORMAL HIGH (ref 101–111)
Glucose, Bld: 91 mg/dL (ref 65–99)
Potassium: 3.7 mmol/L (ref 3.5–5.1)
Sodium: 145 mmol/L (ref 135–145)
TOTAL PROTEIN: 6.1 g/dL — AB (ref 6.5–8.1)
Total Bilirubin: 0.6 mg/dL (ref 0.3–1.2)

## 2017-10-05 LAB — MAGNESIUM: Magnesium: 2 mg/dL (ref 1.7–2.4)

## 2017-10-05 LAB — POCT I-STAT, CHEM 8
BUN: 11 mg/dL (ref 6–20)
CALCIUM ION: 1.13 mmol/L — AB (ref 1.15–1.40)
Chloride: 106 mmol/L (ref 101–111)
Creatinine, Ser: 0.8 mg/dL (ref 0.44–1.00)
Glucose, Bld: 88 mg/dL (ref 65–99)
HEMATOCRIT: 39 % (ref 36.0–46.0)
Hemoglobin: 13.3 g/dL (ref 12.0–15.0)
Potassium: 3.6 mmol/L (ref 3.5–5.1)
SODIUM: 144 mmol/L (ref 135–145)
TCO2: 23 mmol/L (ref 22–32)

## 2017-10-05 MED ORDER — MUPIROCIN CALCIUM 2 % EX CREA: 1.0000 "application " | TOPICAL_CREAM | Freq: Two times a day (BID) | CUTANEOUS | 0 refills | Status: DC

## 2017-10-05 NOTE — ED Triage Notes (Signed)
Pt had breast augmentation and states her incision site is infection, surgeon put her on cipro and she says it makes her dizzy. Pt also has anorexia and wants her potassium. Pt requesting other lab work done.

## 2017-10-05 NOTE — ED Provider Notes (Signed)
[place]    CSN: [id number] Arrival date & time: 10/05/17  1007     History   Chief Complaint Chief Complaint  Patient presents with  . Wound Infection    HPI Katelyn [name] is a 42 y.o. female.   HPI  Patient is here with a couple of complaints.  First she is worried about infection in her surgical wound after having breast augmentation in October.  She states that ever since her surgery she has been "spitting out stitches".  Her surgeon prescribed Cipro for her.  She wonders if this is helping, and wants to have a wound check.  She also complains of all of her muscles "burning".  They feel Easily fatigued.  Even brushing her hair makes her arm muscles "burn".  She called her primary care doctor.  They can see her till Thursday.  They advised her to get lab work done prior to the visit.  She has written on a piece of paper she needs a calcium magnesium and electrolytes.  I will order these for her in anticipation of her doctor's visit, but I told her that they would not be back until after her visit today.  She understands.  I am going to get a potassium stat.  Her chart is reviewed.  She has a complicated medical history.  She has history of morbid obesity, gastric bypass, and then developing anorexia.  She is currently clinically underweight.  She is on a number of psychiatric medications.  She feels like she is reasonably stable and is trying to eat a balanced nutritious diet.  She is compliant with her medications.  She is compliant with her psychiatric follow-up.   Past Medical History:  Diagnosis Date  . Anorexia   . Anxiety   . Benign juvenile melanoma   . Chronic headaches   . Colon polyps    found on colonoscopy 04/26/2012  . Complication of anesthesia    itching after epidural for c section  . Constipation   . Depression   . Depression    several suicide attempts, hospitaluzed in 2012 for this; hx pf ECT treatments ; pt sees Dr. Adele Schilder pyschiatrist   and doing well on medication  . Headache   . Heart murmur    as a child - no one mentions hearing murmur anymore  . Heartburn    no meds  . History of pneumonia    x 3  years ago - no recent problems  . Obesity   . Pneumonia   . Polycystic ovary    takes metformin to treat  . Skin lesion    Excisional biopsy of moles - none cancerous  . Sleep apnea    yrs ago - diagnosed mild sleep apnea - did not have to use cpap     Patient Active Problem List   Diagnosis Date Noted  . Open breast wound, left, initial encounter 04/20/2017  . Neuropathic pain of both legs 03/12/2017  . Sacral pain 03/01/2017  . Bariatric surgery status 05/02/2016  . Major depressive disorder, recurrent episode, moderate ([place]) 02/18/2016  . Iron deficiency anemia 06/10/2015  . Sleep disorder 06/01/2013  . Generalized anxiety disorder 05/30/2013  . Chronic headaches 07/01/2012  . Depression, major, recurrent, severe with psychosis ([place]) 06/17/2012  . Family hx colonic polyps 03/03/2012  . Eating disorder 12/18/2011  . POLYCYSTIC OVARIAN DISEASE 09/01/2008    Past Surgical History:  Procedure Laterality Date  . BREAST ENHANCEMENT SURGERY Bilateral 02/11/2017  .  BREATH TEK H PYLORI N/A 07/22/2013   Procedure: BREATH TEK H PYLORI;  Surgeon: Edward Jolly, MD;  Location: [facility];  Service: General;  Laterality: N/A;  . c sections  08/28/14   x 2  . CESAREAN SECTION  2004, 2007   x 2  . CYSTOSCOPY WITH URETHRAL DILATATION  age 70   . GASTRIC ROUX-EN-Y N/A 10/31/2013   Procedure: LAPAROSCOPIC ROUX-EN-Y GASTRIC BYPASS WITH UPPER ENDOSCOPY;  Surgeon: Edward Jolly, MD;  Location: [facility];  Service: General;  Laterality: N/A;  . kidney stent  08/28/14  . mole excision     "benign juvenile melanoma" removed from left leg - inner thigh  . ROUX-EN-Y PROCEDURE  08/28/14  . SKIN LESION EXCISION     back  . TONSILLECTOMY    . TONSILLECTOMY  age 41  . TUBAL LIGATION      OB History    Gravida  0    Para  0   Term  0   Preterm  0   AB  0   Living        SAB  0   TAB  0   Ectopic  0   Multiple      Live Births               Home Medications    Prior to Admission medications   Medication Sig Start Date End Date Taking? Authorizing Provider  baclofen (LIORESAL) 10 MG tablet Take 10 mg by mouth 3 (three) times daily.    [provider]  buPROPion (WELLBUTRIN) 75 MG tablet Take 2 tab twice a day 08/31/17   Arfeen, Arlyce Harman, MD  clonazePAM (KLONOPIN) 0.5 MG tablet Take 1 tablet (0.5 mg total) by mouth 2 (two) times daily. 08/31/17 08/31/18  Kathlee Nations, MD  conjugated estrogens (PREMARIN) vaginal cream Place 1 Applicatorful vaginally daily.    [provider]  Cyanocobalamin (B-12) 1000 MCG SUBL Place 1,000 mcg under the tongue daily. 01/01/16   Holley Bouche, NP  gabapentin (NEURONTIN) 300 MG capsule Take 4 capsules 3 x a day.  (1200 mg tid) 05/22/17   Narda Amber K, DO  Multiple Vitamin (MULTIVITAMIN WITH MINERALS) TABS tablet Take 1 tablet by mouth 2 (two) times daily.     [provider]  mupirocin cream (BACTROBAN) 2 % Apply 1 application topically 2 (two) times daily. 10/05/17   [name], MD  ondansetron [facility] 4 MG tablet One tablet as needed, for headache 12/08/16   Fayrene Helper, MD  temazepam (RESTORIL) 22.5 MG capsule Take 1 capsule (22.5 mg total) by mouth at bedtime as needed for sleep. 08/31/17   Arfeen, Arlyce Harman, MD  tinidazole (TINDAMAX) 500 MG tablet TAKE 4 TABLETS PO DAILY 12/09/16   [provider]  topiramate (TOPAMAX) 100 MG tablet Take 1 tablet (100 mg total) by mouth 2 (two) times daily. 12/08/16   Fayrene Helper, MD  triamcinolone ointment (KENALOG) 0.1 %  11/21/16   [provider]  ziprasidone (GEODON) 60 MG capsule Take 1 capsule (60 mg total) by mouth at bedtime. 08/31/17   Arfeen, Arlyce Harman, MD    Family History Family History  Problem Relation Age of Onset  . Hypercholesterolemia Mother    . Hypertension Mother        Iterstitial Cystist  . Hyperlipidemia Mother   . Cancer Mother 54       breast   . Depression Brother   .  Alcohol abuse Brother   . Colon polyps Father   . Depression Father   . Irritable bowel syndrome Father   . Alcohol abuse Father   . Colon cancer Paternal Aunt 49  . Heart attack Paternal Grandfather   . Kidney cancer Paternal Grandfather   . Cancer Maternal Grandfather        unknown type    Social History Social History   Tobacco Use  . Smoking status: Former Smoker    Packs/day: 1.00    Years: 5.00    Pack years: 5.00    Types: Cigarettes    Last attempt to quit: 08/26/1997    Years since quitting: 20.1  . Smokeless tobacco: Never Used  Substance Use Topics  . Alcohol use: No    Alcohol/week: 0.0 oz  . Drug use: No     Allergies   Lyrica [pregabalin]; Feraheme [ferumoxytol]; and Penicillins   Review of Systems Review of Systems  Constitutional: Negative for chills and fever.  HENT: Negative for ear pain and sore throat.   Eyes: Negative for pain and visual disturbance.  Respiratory: Negative for cough and shortness of breath.   Cardiovascular: Negative for chest pain and palpitations.  Gastrointestinal: Negative for abdominal pain and vomiting.  Genitourinary: Negative for dysuria and hematuria.  Musculoskeletal: Positive for myalgias. Negative for arthralgias and back pain.  Skin: Positive for wound. Negative for color change and rash.  Neurological: Negative for seizures and syncope.  All other systems reviewed and are negative.    Physical Exam Triage Vital Signs ED Triage Vitals [10/05/17 1031]  Enc Vitals Group     BP 121/79     Pulse Rate 83     Resp 16     Temp 98.9 F (37.2 C)     Temp Source Oral     SpO2 100 %     Weight      Height      Head Circumference      Peak Flow      Pain Score      Pain Loc      Pain Edu?      Excl. in [place]?    No data found.  Updated Vital Signs BP 121/79 (BP  Location: Right Arm)   Pulse 83   Temp 98.9 F (37.2 C) (Oral)   Resp 16   SpO2 100%   Visual Acuity Right Eye Distance:   Left Eye Distance:   Bilateral Distance:    Right Eye Near:   Left Eye Near:    Bilateral Near:     Physical Exam  Constitutional: She appears well-developed and well-nourished. No distress.  HENT:  Head: Normocephalic and atraumatic.  Mouth/Throat: Oropharynx is clear and moist.  Eyes: Pupils are equal, round, and reactive to light. Conjunctivae are normal.  Neck: Normal range of motion.  Cardiovascular: Normal rate, regular rhythm and normal heart sounds.  Pulmonary/Chest: Effort normal and breath sounds normal. No respiratory distress. She has no wheezes.  The incisions are examined from the patient's breast augmentation surgery.  There is no cellulitis.  There are 2 small indurated areas on the left breast that may have been a stitch abscess.  No purulence noted.  Abdominal: Soft. She exhibits no distension. There is no tenderness.  Redundant abdominal skin.  Doughy texture.  No tenderness organomegaly  Musculoskeletal: Normal range of motion. She exhibits no edema.  Neurological: She is alert.  Skin: Skin is warm and dry.     UC  Treatments / Results  Labs (all labs ordered are listed, but only abnormal results are displayed) Labs Reviewed  POCT I-STAT, CHEM 8 - Abnormal; Notable for the following components:      Result Value   Calcium, Ion 1.13 (*)    All other components within normal limits  COMPREHENSIVE METABOLIC PANEL  MAGNESIUM    EKG None  Radiology No results found.  Procedures Procedures (including critical care time)  Medications Ordered in UC Medications - No data to display  Initial Impression / Assessment and Plan / UC Course  I have reviewed the triage vital signs and the nursing notes.  Pertinent labs & imaging results that were available during my care of the patient were reviewed by me and considered in my  medical decision making (see chart for details).     Discussed with patient that her follow-up with her primary care doctor is important.  Lab work is done today in anticipation of that visit.  She does not have hypokalemia.  Since she does not have any significant cellulitis or infection, I do not think she needs to be on the Cipro.  We did give her instead mupirocin.  I do not think the Cipro is contributing to the muscle burning, but I think it is unnecessary at this time. Final Clinical Impressions(s) / UC Diagnoses   Final diagnoses:  Myalgia     Discharge Instructions     Your potassium is good Continue to try to eat well and stay hydrated See your PCP on Thursday We did lab testing during this visit.  If there are any abnormal findings that require change in medicine or indicate a positive result, you will be notified.  If all of your tests are normal, you will not be called.   Use the antibiotic cream to infected skin areas   ED Prescriptions    Medication Sig Dispense Auth. Provider   mupirocin cream (BACTROBAN) 2 % Apply 1 application topically 2 (two) times daily. 15 g [name], MD     Controlled Substance Prescriptions [place] Controlled Substance Registry consulted? Not Applicable   [name], MD 10/05/17 1130

## 2017-10-05 NOTE — Discharge Instructions (Signed)
Your potassium is good Continue to try to eat well and stay hydrated See your PCP on Thursday We did lab testing during this visit.  If there are any abnormal findings that require change in medicine or indicate a positive result, you will be notified.  If all of your tests are normal, you will not be called.   Use the antibiotic cream to infected skin areas

## 2017-10-06 DIAGNOSIS — F3112 Bipolar disorder, current episode manic without psychotic features, moderate: Secondary | ICD-10-CM | POA: Diagnosis not present

## 2017-10-06 DIAGNOSIS — R630 Anorexia: Secondary | ICD-10-CM | POA: Diagnosis not present

## 2017-10-06 DIAGNOSIS — F411 Generalized anxiety disorder: Secondary | ICD-10-CM | POA: Diagnosis not present

## 2017-10-07 DIAGNOSIS — Z79891 Long term (current) use of opiate analgesic: Secondary | ICD-10-CM | POA: Diagnosis not present

## 2017-10-07 DIAGNOSIS — M533 Sacrococcygeal disorders, not elsewhere classified: Secondary | ICD-10-CM | POA: Diagnosis not present

## 2017-10-07 DIAGNOSIS — G894 Chronic pain syndrome: Secondary | ICD-10-CM | POA: Diagnosis not present

## 2017-10-08 ENCOUNTER — Encounter: Payer: Self-pay | Admitting: Family Medicine

## 2017-10-08 ENCOUNTER — Ambulatory Visit (INDEPENDENT_AMBULATORY_CARE_PROVIDER_SITE_OTHER): Payer: PPO | Admitting: Family Medicine

## 2017-10-08 VITALS — BP 118/70 | HR 81 | Resp 16 | Ht 66.0 in | Wt 128.0 lb

## 2017-10-08 DIAGNOSIS — R519 Headache, unspecified: Secondary | ICD-10-CM

## 2017-10-08 DIAGNOSIS — G5793 Unspecified mononeuropathy of bilateral lower limbs: Secondary | ICD-10-CM

## 2017-10-08 DIAGNOSIS — F5000 Anorexia nervosa, unspecified: Secondary | ICD-10-CM

## 2017-10-08 DIAGNOSIS — D508 Other iron deficiency anemias: Secondary | ICD-10-CM

## 2017-10-08 DIAGNOSIS — Z13220 Encounter for screening for lipoid disorders: Secondary | ICD-10-CM

## 2017-10-08 DIAGNOSIS — R7301 Impaired fasting glucose: Secondary | ICD-10-CM

## 2017-10-08 DIAGNOSIS — R52 Pain, unspecified: Secondary | ICD-10-CM | POA: Diagnosis not present

## 2017-10-08 DIAGNOSIS — Z8371 Family history of colonic polyps: Secondary | ICD-10-CM

## 2017-10-08 DIAGNOSIS — M6281 Muscle weakness (generalized): Secondary | ICD-10-CM

## 2017-10-08 DIAGNOSIS — G8929 Other chronic pain: Secondary | ICD-10-CM

## 2017-10-08 DIAGNOSIS — D369 Benign neoplasm, unspecified site: Secondary | ICD-10-CM | POA: Diagnosis not present

## 2017-10-08 DIAGNOSIS — N926 Irregular menstruation, unspecified: Secondary | ICD-10-CM

## 2017-10-08 DIAGNOSIS — R51 Headache: Secondary | ICD-10-CM

## 2017-10-08 DIAGNOSIS — E559 Vitamin D deficiency, unspecified: Secondary | ICD-10-CM

## 2017-10-08 MED ORDER — TOPIRAMATE 100 MG PO TABS: 100.0000 mg | ORAL_TABLET | Freq: Two times a day (BID) | ORAL | 5 refills | Status: DC

## 2017-10-08 MED ORDER — FLUCONAZOLE 150 MG PO TABS: ORAL_TABLET | ORAL | 0 refills | Status: DC

## 2017-10-08 NOTE — Progress Notes (Signed)
   MICKIE BADDERS     MRN: [id number]      DOB: 11-Oct-1975   HPI [name] is here for follow up. States she spends 80 % of her life in bed because of pain in low back and LLE to foot and all this started since October 2018 following breast augmentation surgery ,  February 09, 2017. genralized wasting and weakness of all muscles  Numbness and weakness of LUE left jaw and neck.  Neurologist sent pt to pain specialist Pt has pain with pressure on buttock , pain with pressure on breasts, pain on walking  Dr Francesco Runner , Sherlyn Lick, Dr  Adele Schilder, Neurologist Dr Cathlean Marseilles GYne,Dr  Fuller Plan is Gastroenterologist She sees a nutritionist who s concerned about her loss of body fat, also her imbalance of electrolytes is causing her to have irregularities in her heart rate and she is having bradycardia when she stands a times Her colonoscopy is past due, wants to hold off on getting this until after her Summer wedding Also c/o irregular menses for weeks at a time,  However her gynecologist is managing this C/o circulation concern that lower legs and feet appear red when she stands  ROS Denies recent fever or chills. Denies sinus pressure, nasal congestion, ear pain or sore throat. Denies chest congestion, productive cough or wheezing. C/o . C/o chronic joint pain and limitation in mobility, uncontrolled wants to get  physical therapy for help, epidural injection was not helpful. . C/o depression, anxiety and  Insomnia.she is treated for all 3  C/o ongoing infection in left breast PE  BP 118/70   Pulse 81   Resp 16   Ht 5\' 6"  (1.676 m)   Wt 128 lb (58.1 kg)   SpO2 98%   BMI 20.66 kg/m   Patient alert and oriented and in no cardiopulmonary distress.  HEENT: No facial asymmetry, EOMI,   oropharynx pink and moist.  Neck decreased ROM  no JVD, no mass.  Chest: Clear to auscultation bilaterally.  CVS: S1, S2 no murmurs, no S3.Regular rate.  ABD: Soft non tender.   Ext: No edema  MS:  decreased  ROM spine, shoulders, hips and knees.  Skin: Intact, no ulcerations or rash noted.  Psych: Good eye contact, flat affect. Mildly  anxious and depressed appearing.  CNS: CN 2-12 intact,   Assessment & Plan  Neuropathic pain  Increased and uncontrolled pain affecting both lower extremities disabling, no position of comfort, also pt has weakness and wasting of all muscles, physical therapy to evaluate and assist with strengthening and pain relief if possibel  Family hx colonic polyps Patient has tubular adenoma and f/h of colon polyps, needs rept colonoscopy, this is past due , will refer, she is getting married in the Summer and is leaning towards having the test done after this, will finalize the date with GI  Generalized pain Pain in back, neck, arms, legs, breasts, no position of comfort since October 2018, disab;ling, Pain specialist has been unsuccessful as far as epidural and gabapentin, hopeful that physical therapy may e useful, will refer  Irregular menses Reports frequent irregular bleeding in the past approximately  4 months, gynecology is  Managing this   Eating disorder Followed regularly by a nutrition expert  Chronic headaches Controlled with topamax, continue same dose

## 2017-10-08 NOTE — Patient Instructions (Addendum)
Wellness with nurse past due, please schedule  Please schedule f/u with mD in mid Septemeber  Fasting labs as soon as possible    Medications are sent in as discussed   I will refer you to Dr Fuller Plan call and schedule as discussed  You are being referred to PT twice weekly for 6 weeks for pain  Congrats and good wishes , I hope that you feel better as time goes on

## 2017-10-09 DIAGNOSIS — E559 Vitamin D deficiency, unspecified: Secondary | ICD-10-CM | POA: Diagnosis not present

## 2017-10-09 DIAGNOSIS — Z13220 Encounter for screening for lipoid disorders: Secondary | ICD-10-CM | POA: Diagnosis not present

## 2017-10-09 DIAGNOSIS — D508 Other iron deficiency anemias: Secondary | ICD-10-CM | POA: Diagnosis not present

## 2017-10-09 DIAGNOSIS — R7301 Impaired fasting glucose: Secondary | ICD-10-CM | POA: Diagnosis not present

## 2017-10-10 LAB — HEMOGLOBIN A1C
Hgb A1c MFr Bld: 4.6 % of total Hgb (ref <5.7)
Mean Plasma Glucose: 85 (calc)
eAG (mmol/L): 4.7 (calc)

## 2017-10-10 LAB — VITAMIN D 25 HYDROXY (VIT D DEFICIENCY, FRACTURES): VIT D 25 HYDROXY: 45 ng/mL (ref 30–100)

## 2017-10-10 LAB — LIPID PANEL
CHOL/HDL RATIO: 2.2 (calc) (ref <5.0)
CHOLESTEROL: 139 mg/dL (ref <200)
HDL: 63 mg/dL (ref >50)
LDL CHOLESTEROL (CALC): 61 mg/dL
Non-HDL Cholesterol (Calc): 76 mg/dL (calc) (ref <130)
Triglycerides: 70 mg/dL (ref <150)

## 2017-10-10 LAB — VITAMIN B12: Vitamin B-12: 572 pg/mL (ref 200–1100)

## 2017-10-12 ENCOUNTER — Encounter: Payer: Self-pay | Admitting: Family Medicine

## 2017-10-13 ENCOUNTER — Telehealth: Payer: Self-pay | Admitting: Family Medicine

## 2017-10-13 NOTE — Telephone Encounter (Signed)
Pt wants lab results and needs to know if she will be getting B12 shots- please call her

## 2017-10-13 NOTE — Telephone Encounter (Signed)
Pt aware.

## 2017-10-14 ENCOUNTER — Ambulatory Visit: Payer: Self-pay | Admitting: Family Medicine

## 2017-10-15 ENCOUNTER — Ambulatory Visit (INDEPENDENT_AMBULATORY_CARE_PROVIDER_SITE_OTHER): Payer: PPO

## 2017-10-15 ENCOUNTER — Telehealth: Payer: Self-pay

## 2017-10-15 VITALS — BP 114/70 | HR 66 | Temp 98.6°F | Resp 16 | Ht 65.5 in | Wt 131.0 lb

## 2017-10-15 DIAGNOSIS — Z0000 Encounter for general adult medical examination without abnormal findings: Secondary | ICD-10-CM

## 2017-10-15 DIAGNOSIS — Z1159 Encounter for screening for other viral diseases: Secondary | ICD-10-CM

## 2017-10-15 NOTE — Patient Instructions (Signed)
[name] , Thank you for taking time to come for your Medicare Wellness Visit. I appreciate your ongoing commitment to your health goals. Please review the following plan we discussed and let me know if I can assist you in the future.   Screening recommendations/referrals: Colonoscopy: You will call and schedule this Mammogram: You want to wait until Friday to make this with you gynecologist  Bone Density: You want to wait and discuss this with Dr.Simpson at your September appointment.  Recommended yearly ophthalmology/optometry visit for glaucoma screening and checkup Recommended yearly dental visit for hygiene and checkup  Vaccinations: Influenza vaccine: Due in Fall 2019 Pneumococcal vaccine: You aren't eligible for this yet Tdap vaccine: UTD Shingles vaccine: You aren't eligible for this yet    Advanced directives: You don't have these in place but I will give you the paperwork so you can read over it today.   Conditions/risks identified: These were discussed during your visit today  Next appointment: January 13, 2018 at 9:00 am with Dr.Simpson. Call us if you need anything before then.   Preventive Care 40-64 Years, Female Preventive care refers to lifestyle choices and visits with your health care provider that can promote health and wellness. What does preventive care include?  A yearly physical exam. This is also called an annual well check.  Dental exams once or twice a year.  Routine eye exams. Ask your health care provider how often you should have your eyes checked.  Personal lifestyle choices, including:  Daily care of your teeth and gums.  Regular physical activity.  Eating a healthy diet.  Avoiding tobacco and drug use.  Limiting alcohol use.  Practicing safe sex.  Taking low-dose aspirin daily starting at age 110.  Taking vitamin and mineral supplements as recommended by your health care provider. What happens during an annual well check? The  services and screenings done by your health care provider during your annual well check will depend on your age, overall health, lifestyle risk factors, and family history of disease. Counseling  Your health care provider may ask you questions about your:  Alcohol use.  Tobacco use.  Drug use.  Emotional well-being.  Home and relationship well-being.  Sexual activity.  Eating habits.  Work and work Statistician.  Method of birth control.  Menstrual cycle.  Pregnancy history. Screening  You may have the following tests or measurements:  Height, weight, and BMI.  Blood pressure.  Lipid and cholesterol levels. These may be checked every 5 years, or more frequently if you are over 73 years old.  Skin check.  Lung cancer screening. You may have this screening every year starting at age 32 if you have a 30-pack-year history of smoking and currently smoke or have quit within the past 15 years.  Fecal occult blood test (FOBT) of the stool. You may have this test every year starting at age 8.  Flexible sigmoidoscopy or colonoscopy. You may have a sigmoidoscopy every 5 years or a colonoscopy every 10 years starting at age 57.  Hepatitis C blood test.  Hepatitis B blood test.  Sexually transmitted disease (STD) testing.  Diabetes screening. This is done by checking your blood sugar (glucose) after you have not eaten for a while (fasting). You may have this done every 1-3 years.  Mammogram. This may be done every 1-2 years. Talk to your health care provider about when you should start having regular mammograms. This may depend on whether you have a family history of breast cancer.  BRCA-related cancer screening. This may be done if you have a family history of breast, ovarian, tubal, or peritoneal cancers.  Pelvic exam and Pap test. This may be done every 3 years starting at age 58. Starting at age 23, this may be done every 5 years if you have a Pap test in combination with  an HPV test.  Bone density scan. This is done to screen for osteoporosis. You may have this scan if you are at high risk for osteoporosis. Discuss your test results, treatment options, and if necessary, the need for more tests with your health care provider. Vaccines  Your health care provider may recommend certain vaccines, such as:  Influenza vaccine. This is recommended every year.  Tetanus, diphtheria, and acellular pertussis (Tdap, Td) vaccine. You may need a Td booster every 10 years.  Zoster vaccine. You may need this after age 15.  Pneumococcal 13-valent conjugate (PCV13) vaccine. You may need this if you have certain conditions and were not previously vaccinated.  Pneumococcal polysaccharide (PPSV23) vaccine. You may need one or two doses if you smoke cigarettes or if you have certain conditions. Talk to your health care provider about which screenings and vaccines you need and how often you need them. This information is not intended to replace advice given to you by your health care provider. Make sure you discuss any questions you have with your health care provider. Document Released: 05/11/2015 Document Revised: 01/02/2016 Document Reviewed: 02/13/2015 Elsevier Interactive Patient Education  2017 [place] Prevention in the Home Falls can cause injuries. They can happen to people of all ages. There are many things you can do to make your home safe and to help prevent falls. What can I do on the outside of my home?  Regularly fix the edges of walkways and driveways and fix any cracks.  Remove anything that might make you trip as you walk through a door, such as a raised step or threshold.  Trim any bushes or trees on the path to your home.  Use bright outdoor lighting.  Clear any walking paths of anything that might make someone trip, such as rocks or tools.  Regularly check to see if handrails are loose or broken. Make sure that both sides of any steps  have handrails.  Any raised decks and porches should have guardrails on the edges.  Have any leaves, snow, or ice cleared regularly.  Use sand or salt on walking paths during winter.  Clean up any spills in your garage right away. This includes oil or grease spills. What can I do in the bathroom?  Use night lights.  Install grab bars by the toilet and in the tub and shower. Do not use towel bars as grab bars.  Use non-skid mats or decals in the tub or shower.  If you need to sit down in the shower, use a plastic, non-slip stool.  Keep the floor dry. Clean up any water that spills on the floor as soon as it happens.  Remove soap buildup in the tub or shower regularly.  Attach bath mats securely with double-sided non-slip rug tape.  Do not have throw rugs and other things on the floor that can make you trip. What can I do in the bedroom?  Use night lights.  Make sure that you have a light by your bed that is easy to reach.  Do not use any sheets or blankets that are too big for your bed. They  should not hang down onto the floor.  Have a firm chair that has side arms. You can use this for support while you get dressed.  Do not have throw rugs and other things on the floor that can make you trip. What can I do in the kitchen?  Clean up any spills right away.  Avoid walking on wet floors.  Keep items that you use a lot in easy-to-reach places.  If you need to reach something above you, use a strong step stool that has a grab bar.  Keep electrical cords out of the way.  Do not use floor polish or wax that makes floors slippery. If you must use wax, use non-skid floor wax.  Do not have throw rugs and other things on the floor that can make you trip. What can I do with my stairs?  Do not leave any items on the stairs.  Make sure that there are handrails on both sides of the stairs and use them. Fix handrails that are broken or loose. Make sure that handrails are as  long as the stairways.  Check any carpeting to make sure that it is firmly attached to the stairs. Fix any carpet that is loose or worn.  Avoid having throw rugs at the top or bottom of the stairs. If you do have throw rugs, attach them to the floor with carpet tape.  Make sure that you have a light switch at the top of the stairs and the bottom of the stairs. If you do not have them, ask someone to add them for you. What else can I do to help prevent falls?  Wear shoes that:  Do not have high heels.  Have rubber bottoms.  Are comfortable and fit you well.  Are closed at the toe. Do not wear sandals.  If you use a stepladder:  Make sure that it is fully opened. Do not climb a closed stepladder.  Make sure that both sides of the stepladder are locked into place.  Ask someone to hold it for you, if possible.  Clearly mark and make sure that you can see:  Any grab bars or handrails.  First and last steps.  Where the edge of each step is.  Use tools that help you move around (mobility aids) if they are needed. These include:  Canes.  Walkers.  Scooters.  Crutches.  Turn on the lights when you go into a dark area. Replace any light bulbs as soon as they burn out.  Set up your furniture so you have a clear path. Avoid moving your furniture around.  If any of your floors are uneven, fix them.  If there are any pets around you, be aware of where they are.  Review your medicines with your doctor. Some medicines can make you feel dizzy. This can increase your chance of falling. Ask your doctor what other things that you can do to help prevent falls. This information is not intended to replace advice given to you by your health care provider. Make sure you discuss any questions you have with your health care provider. Document Released: 02/08/2009 Document Revised: 09/20/2015 Document Reviewed: 05/19/2014 Elsevier Interactive Patient Education  2017 [employer].
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
## 2017-10-15 NOTE — Telephone Encounter (Signed)
Referral entered and chart closed , pls follow through in  am

## 2017-10-15 NOTE — Assessment & Plan Note (Addendum)
Patient has tubular adenoma and f/h of colon polyps, needs rept colonoscopy, this is past due , will refer, she is getting married in the Summer and is leaning towards having the test done after this, will finalize the date with GI

## 2017-10-15 NOTE — Telephone Encounter (Signed)
Patient came in today for her AWV. She stated she saw her nutritionist, she is post gastric bypass surgery. She states she has lost 7.6% body fat,however, she is retaining a lot of fluid. The nutritionist scale can show how much fluid she is holding on to, and the weight she is gaining is all fluid.   She is also wanting to know the status of the PT referral discussed at her last visit. I let her know I would send Dr.Simpson a message and get back with her.   I offered to make the patients mammogram and colonoscopy appts today for her, but she stated she is getting married in September and doesn't want to have a lot of appointments before then because of all the pain she is in and her parents are away for the summer.

## 2017-10-15 NOTE — Assessment & Plan Note (Signed)
Increased and uncontrolled pain affecting both lower extremities disabling, no position of comfort, also pt has weakness and wasting of all muscles, physical therapy to evaluate and assist with strengthening and pain relief if possibel

## 2017-10-16 DIAGNOSIS — R52 Pain, unspecified: Secondary | ICD-10-CM | POA: Insufficient documentation

## 2017-10-16 DIAGNOSIS — N83202 Unspecified ovarian cyst, left side: Secondary | ICD-10-CM | POA: Diagnosis not present

## 2017-10-16 DIAGNOSIS — N926 Irregular menstruation, unspecified: Secondary | ICD-10-CM | POA: Insufficient documentation

## 2017-10-16 LAB — HEPATITIS C ANTIBODY
HEP C AB: NONREACTIVE
SIGNAL TO CUT-OFF: 0.02 (ref <1.00)

## 2017-10-16 NOTE — Telephone Encounter (Signed)
Spoke with patient and advised her referral placed for PT with verbal understanding.

## 2017-10-16 NOTE — Assessment & Plan Note (Signed)
Controlled with topamax, continue same dose

## 2017-10-16 NOTE — Assessment & Plan Note (Signed)
Followed regularly by a nutrition expert

## 2017-10-16 NOTE — Assessment & Plan Note (Signed)
Pain in back, neck, arms, legs, breasts, no position of comfort since October 2018, disab;ling, Pain specialist has been unsuccessful as far as epidural and gabapentin, hopeful that physical therapy may e useful, will refer

## 2017-10-16 NOTE — Assessment & Plan Note (Signed)
Reports frequent irregular bleeding in the past approximately  4 months, gynecology is  Managing this

## 2017-10-22 ENCOUNTER — Ambulatory Visit (HOSPITAL_COMMUNITY): Payer: PPO | Admitting: Licensed Clinical Social Worker

## 2017-10-22 DIAGNOSIS — F331 Major depressive disorder, recurrent, moderate: Secondary | ICD-10-CM | POA: Diagnosis not present

## 2017-10-22 DIAGNOSIS — F411 Generalized anxiety disorder: Secondary | ICD-10-CM

## 2017-10-23 ENCOUNTER — Encounter (HOSPITAL_COMMUNITY): Payer: Self-pay | Admitting: Licensed Clinical Social Worker
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
## 2017-11-02 ENCOUNTER — Ambulatory Visit (HOSPITAL_COMMUNITY): Payer: PPO | Admitting: Psychiatry

## 2017-11-03 ENCOUNTER — Other Ambulatory Visit (HOSPITAL_COMMUNITY): Payer: Self-pay | Admitting: Psychiatry

## 2017-11-03 ENCOUNTER — Inpatient Hospital Stay (HOSPITAL_COMMUNITY): Payer: PPO | Attending: Hematology

## 2017-11-03 DIAGNOSIS — D508 Other iron deficiency anemias: Secondary | ICD-10-CM | POA: Insufficient documentation

## 2017-11-03 DIAGNOSIS — K909 Intestinal malabsorption, unspecified: Secondary | ICD-10-CM | POA: Diagnosis not present

## 2017-11-03 DIAGNOSIS — D500 Iron deficiency anemia secondary to blood loss (chronic): Secondary | ICD-10-CM

## 2017-11-03 LAB — COMPREHENSIVE METABOLIC PANEL
ALBUMIN: 4.6 g/dL (ref 3.5–5.0)
ALT: 32 U/L (ref 0–44)
AST: 26 U/L (ref 15–41)
Alkaline Phosphatase: 66 U/L (ref 38–126)
Anion gap: 7 (ref 5–15)
BUN: 17 mg/dL (ref 6–20)
CO2: 26 mmol/L (ref 22–32)
Calcium: 9.1 mg/dL (ref 8.9–10.3)
Chloride: 110 mmol/L (ref 98–111)
Creatinine, Ser: 0.89 mg/dL (ref 0.44–1.00)
GFR calc non Af Amer: >60 mL/min (ref >60)
GLUCOSE: 85 mg/dL (ref 70–99)
POTASSIUM: 3.8 mmol/L (ref 3.5–5.1)
SODIUM: 143 mmol/L (ref 135–145)
Total Bilirubin: 0.4 mg/dL (ref 0.3–1.2)
Total Protein: 7.1 g/dL (ref 6.5–8.1)

## 2017-11-03 LAB — CBC WITH DIFFERENTIAL/PLATELET
BASOS ABS: 0 10*3/uL (ref 0.0–0.1)
BASOS PCT: 0 %
EOS PCT: 1 %
Eosinophils Absolute: 0.1 10*3/uL (ref 0.0–0.7)
HCT: 42.8 % (ref 36.0–46.0)
Hemoglobin: 14.4 g/dL (ref 12.0–15.0)
LYMPHS PCT: 23 %
Lymphs Abs: 2 10*3/uL (ref 0.7–4.0)
MCH: 34.5 pg — ABNORMAL HIGH (ref 26.0–34.0)
MCHC: 33.6 g/dL (ref 30.0–36.0)
MCV: 102.6 fL — AB (ref 78.0–100.0)
MONO ABS: 0.6 10*3/uL (ref 0.1–1.0)
Monocytes Relative: 7 %
Neutro Abs: 6 10*3/uL (ref 1.7–7.7)
Neutrophils Relative %: 69 %
PLATELETS: 311 10*3/uL (ref 150–400)
RBC: 4.17 MIL/uL (ref 3.87–5.11)
RDW: 12.6 % (ref 11.5–15.5)
WBC: 8.8 10*3/uL (ref 4.0–10.5)

## 2017-11-03 LAB — FERRITIN: Ferritin: 62 ng/mL (ref 11–307)

## 2017-11-03 LAB — LACTATE DEHYDROGENASE: LDH: 141 U/L (ref 98–192)

## 2017-11-05 ENCOUNTER — Telehealth (HOSPITAL_COMMUNITY): Payer: Self-pay

## 2017-11-05 ENCOUNTER — Telehealth: Payer: Self-pay | Admitting: Family Medicine

## 2017-11-05 ENCOUNTER — Ambulatory Visit (HOSPITAL_COMMUNITY): Payer: PPO | Attending: Family Medicine

## 2017-11-05 DIAGNOSIS — M6281 Muscle weakness (generalized): Secondary | ICD-10-CM | POA: Diagnosis not present

## 2017-11-05 DIAGNOSIS — M533 Sacrococcygeal disorders, not elsewhere classified: Secondary | ICD-10-CM | POA: Diagnosis not present

## 2017-11-05 DIAGNOSIS — R627 Adult failure to thrive: Secondary | ICD-10-CM | POA: Diagnosis not present

## 2017-11-05 NOTE — Telephone Encounter (Signed)
Patient called in to let you know she had physical therapy at 2:30 this afternoon, she left feeling dizzy. Now she has a severe headache,dizzy,and nausea. Cb# [id number]/ [id number]

## 2017-11-05 NOTE — Therapy (Addendum)
PHYSICAL THERAPY DISCHARGE SUMMARY  Visits from Start of Care: 1  Current functional level related to goals / functional outcomes: *unchanged, patient unable to return to PT d/t onset of vertigo after evaluation.    Remaining deficits: *unchanged, patient unable to return to PT d/t onset of vertigo after evaluation.    Education / Equipment: N/a  Plan: Patient agrees to discharge.  Patient goals were not met. Patient is being discharged due to the patient's request.  ?????            3:16 PM, 12/08/17 Etta Grandchild, PT, DPT Physical Therapist at [facility] [phone] (office)        [facility] [address] [place], [place], [address] Phone: [phone]   Fax:  [phone]  Physical Therapy Evaluation  Patient Details  Name: DEMESHIA SHERBURNE MRN: [id number] Date of Birth: [birth year] Referring Provider: [name]    Encounter Date: 11/05/2017  PT End of Session - 11/05/17 1242    Visit Number  1    Number of Visits  12    Date for PT Re-Evaluation  12/17/17    Authorization Type  [employer]- No visit limits     Authorization Time Period  11/05/17-12/17/17 (reassess on 8/1)    PT Start Time  1031    PT Stop Time  1124    PT Time Calculation (min)  53 min    Activity Tolerance  Patient tolerated treatment well;Patient limited by pain;Patient limited by fatigue    Behavior During Therapy  Flat affect       Past Medical History:  Diagnosis Date  . Anorexia   . Anxiety   . Benign juvenile melanoma   . Chronic headaches   . Colon polyps    found on colonoscopy 04/26/2012  . Complication of anesthesia    itching after epidural for c section  . Constipation   . Depression   . Depression    several suicide attempts, hospitaluzed in 2012 for this; hx pf ECT treatments ; pt sees Dr. Adele Schilder pyschiatrist  and doing well on medication  . Headache   . Heart murmur     as a child - no one mentions hearing murmur anymore  . Heartburn    no meds  . History of pneumonia    x 3  years ago - no recent problems  . Obesity   . Pneumonia   . Polycystic ovary    takes metformin to treat  . Skin lesion    Excisional biopsy of moles - none cancerous  . Sleep apnea    yrs ago - diagnosed mild sleep apnea - did not have to use cpap     Past Surgical History:  Procedure Laterality Date  . BREAST ENHANCEMENT SURGERY Bilateral 02/11/2017  . BREATH TEK H PYLORI N/A 07/22/2013   Procedure: BREATH TEK H PYLORI;  Surgeon: Edward Jolly, MD;  Location: [facility];  Service: General;  Laterality: N/A;  . c sections  08/28/14   x 2  . CESAREAN SECTION  2004, 2007   x 2  . CYSTOSCOPY WITH URETHRAL DILATATION  age 55   . GASTRIC ROUX-EN-Y N/A 10/31/2013   Procedure: LAPAROSCOPIC ROUX-EN-Y GASTRIC BYPASS WITH UPPER ENDOSCOPY;  Surgeon: Edward Jolly, MD;  Location: [facility];  Service: General;  Laterality: N/A;  . kidney stent  08/28/14  . mole excision     "benign juvenile melanoma" removed  from left leg - inner thigh  . ROUX-EN-Y PROCEDURE  08/28/14  . SKIN LESION EXCISION     back  . TONSILLECTOMY    . TONSILLECTOMY  age 27  . TUBAL LIGATION      There were no vitals filed for this visit.   Subjective Assessment - 11/05/17 1036    Subjective  Pt is referred to PT form PCP Dr. [name]. Pt has had a complicated course in last year since October after immediate onset of bilat gluteal pain after a surgery in october. Pain reports she is worse with sustain pressure (ie siting) or lying. Associated symptoms include left leg paresthesia in LLE in most heavily in the Left foot, but also up to anterior thigh/pelvis, and she also has burning sensation in the left posterior thigh only (exercise burn, like "fire", or like a sun burn). Pt also reports soreness in quads bilat, although unclear if this is related to metabolic dysfunction (muscle wasting) or 9  months of altered functional habits. Pt reports some help with gabapentin, and good 10 day response after tailbone nerve injection, but had increased nerve burning pain in buttocks. Pt is scheduled for another injection in this same area soon, but with an agent anticipated to provide more long-term relief. Pt not exercising right now per nutrition as she is "not eating enough" d/t anorexia. Pt alternates laying on stomach/standing up throughout day.      How long can you sit comfortably?  unable to toelrate sitting, progressive pain immediately.     How long can you stand comfortably?  Pt stands most of the time d/t inability to sit, uses a timer to limit it to 20 minutes.     How long can you walk comfortably?  Tolerating AMB in the home only, IAD[facility] community AMB 1-2x weekly.      Currently in Pain?  Yes    Pain Score  9     Pain Location  -- bilat gluteal region, tailbone, and central low back     Pain Descriptors / Indicators  Burning "like sunburn"     Pain Type  Chronic pain;Neuropathic pain    Pain Onset  More than a month ago    Pain Frequency  Constant         [facility] PT Assessment - 11/05/17 0001      Assessment   Medical Diagnosis  Chronic Neurpathic Low Back pain, muscle wasting and weakness    Referring Provider  [name]     Onset Date/Surgical Date  -- October 2018    Prior Therapy  None      Precautions   Precautions  None Nutritionist has asked patient to avoid exercise    Precaution Comments  RD wants body fat >23% prior to resuming exercise      Restrictions   Weight Bearing Restrictions  No      Balance Screen   Has the patient fallen in the past 6 months  No    Has the patient had a decrease in activity level because of a fear of falling?   No    Is the patient reluctant to leave their home because of a fear of falling?   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       Observation/Other Assessments   Focus on Therapeutic Outcomes (FOTO)   will perform at next visit      Sensation   Light Touch  Not tested pt attests to LLE paresthesia since October      Functional Tests   Functional tests  Sit to Stand;Single leg stance      Single Leg Stance   Comments  >30sec bilat eyes open  eyes closed: 11sec Right, 3seconds Left       Sit to Stand   Comments  5xSTS (modified to comfort for patient) 12.5sec      ROM / Strength   AROM / PROM / Strength  Strength      Strength   Strength Assessment Site  Hip;Knee    Right/Left Hip  Right;Left    Right Hip Flexion  4/5    Right Hip Extension  -- test at next visit    Right Hip External Rotation   4+/5    Right Hip Internal Rotation  4+/5    Right Hip ABduction  -- test at next visit, consider in prone    Left Hip Flexion  4/5    Left Hip Extension  -- test at next visit    Left Hip External Rotation  4+/5    Left Hip Internal Rotation  4+/5    Left Hip ABduction  -- test at next visit, consider in prone    Right/Left Knee  Right;Left    Right Knee Flexion  3+/5    Right Knee Extension  4-/5    Left Knee Flexion  4/5    Left Knee Extension  4/5      Palpation   Spinal mobility  mild to moderate hypomobility in flexion during toe touch     Palpation comment  anterior thighs feels squishy, questionable fluid?  pt reports likely "extyra skin" as she used to weight >300lb      Ambulation/Gait   Ambulation Distance (Feet)  450 Feet    Gait velocity  1.60ms     Gait Comments  Legs begin to fatigue at end of 4070f speed slows, mild SOB                Objective measurements completed on examination: See above findings.      [facility] PT Treatment/Exercise - 11/05/17 0001      Exercises   Exercises   Lumbar;Knee/Hip      Lumbar Exercises: Stretches   Other Lumbar Stretch Exercise  Forward flexion stretch (toe touch)  5x15 seconds    Other Lumbar Stretch Exercise  Repeated flexion standing: 1x15 mild dizziness upon completion      Knee/Hip Exercises: Standing   Wall Squat  1 set;10 reps wide stance, mild toe,out, avoid knee valgus      Knee/Hip Exercises: Seated   Sit to Sand  1 set;10 reps;without UE support chair squats without sitting             PT Education - 11/05/17 1241    Education Details  Concerns over inactivity in relation to muscle wasting and BMD loss    Person(s) Educated  Patient    Methods  Explanation    Comprehension  Verbalized understanding;Need further instruction       PT Short Term Goals - 11/05/17 1339      PT SHORT  TERM GOAL #1   Title  At 3 weeks patient will tolerate performance of 1022f continunous AMB at 1.074m or greater.       PT SHORT TERM GOAL #2   Title  At 3 weeks patient will reports 3 stretches exercises or activities to help self manage back pain.         PT Long Term Goals - 11/05/17 1340      PT LONG TERM GOAL #1   Title  At six weeks pt will demosntrate 5/5 muscle strength throughout BLE.       PT LONG TERM GOAL #2   Title  At six weeks patient will tolerate sitting up to 10 minutes on a cushioned surface without increased pain.      PT LONG TERM GOAL #3   Title  At six weeks patient will be independent and compliant with a walking exercise program 3-days per week to improve activity tolerance and promote BMD maintenence or improvement.               Plan - 11/05/17 1243    Clinical Impression Statement  Examination revelaing of heavy strength deficits in BLE, most notably knee and hip groups, chronic neuropathic pain in the posterior pelvis, LLE paresthesia that appears to have minimally altered balance/proprioception, low back pain., adn poor toelrance to basic AMB required for limited community distances.  Examination is limited by pt's poor tolerance of sitting and supine. Pt will benefit from PT to address these deficits and to promote improved strength and functional capacity. Discussed potential for sensory reintegration training with patient in future sessions. Will also need to discussed exercise parameters with PCP and RD.      History and Personal Factors relevant to plan of care:  Pt has anorexia nervosa and is followed by RD and LCSW to address eating habits, etc. RD has specific guidelines in place to limit physical activity and exercise until body fat percentage increases over 23% (now at 17%).    Clinical Presentation  Unstable    Clinical Presentation due to:  tests and measures    Clinical Decision Making  High    Rehab Potential  Fair    PT Frequency  2x / week    PT Duration  6 weeks    PT Treatment/Interventions  Passive range of motion;Therapeutic exercise;Therapeutic activities;Electrical Stimulation;Stair training;Functional mobility training    PT Next Visit Plan  Review HEP and symptoms response, discuss treatment goals, trial tolerance to prone on pillow, screen neck ROM (pt now with neck pain due to only tolerating prone for sleep).     PT Home Exercise Plan  wall squats, chair squats, toe touch back stretch.     Consulted and Agree with Plan of Care  Patient       Patient will benefit from skilled therapeutic intervention in order to improve the following deficits and impairments:  Cardiopulmonary status limiting activity, Decreased endurance, Impaired sensation, Decreased activity tolerance, Decreased strength, Pain, Difficulty walking  Visit Diagnosis: Muscle weakness (generalized) - Plan: PT plan of care cert/re-cert  Adult failure to thrive - Plan: PT plan of care cert/re-cert  Sacrococcygeal disorders, not elsewhere classified - Plan: PT plan of care cert/re-cert     Problem List Patient Active Problem List   Diagnosis Date Noted  . Generalized pain  10/16/2017  . Irregular menses 10/16/2017  . Open breast wound, left, initial encounter 04/20/2017  . Neuropathic pain 03/12/2017  . Sacral pain 03/01/2017  . Bariatric surgery  status 05/02/2016  . Major depressive disorder, recurrent episode, moderate ([place]) 02/18/2016  . Iron deficiency anemia 06/10/2015  . Sleep disorder 06/01/2013  . Generalized anxiety disorder 05/30/2013  . Chronic headaches 07/01/2012  . Depression, major, recurrent, severe with psychosis ([place]) 06/17/2012  . Family hx colonic polyps 03/03/2012  . Eating disorder 12/18/2011  . POLYCYSTIC OVARIAN DISEASE 09/01/2008    1:44 PM, 11/05/17 Etta Grandchild, PT, DPT Physical Therapist at [facility] [phone] (office)      Etta Grandchild 11/05/2017, 1:43 PM  [facility] [address] [place], [place], [address] Phone: [phone]   Fax:  [phone]  Name: FIZZA SCALES MRN: [id number] Date of Birth: 29-Aug-1975

## 2017-11-05 NOTE — Telephone Encounter (Signed)
Patient out for a week

## 2017-11-05 NOTE — Telephone Encounter (Signed)
I spoke directly with the patient. I advised OTC meclizine for vertigo ,  tylenol for pain, and she already has zofran for nausea. Symptoms started at 11 am and are worsening she reports, I advised go to the ED/ urgent care, she stated , she does not want to go and sit for a long time somewhere I re iterated the need to go for clinical evaluation if her symptoms were worsening I also advised her to [facility]  Due to her Excessive pain and adverse reaction to her first sesssion, and will send a message to the department

## 2017-11-05 NOTE — Telephone Encounter (Signed)
Note    PT returning patients's call. Pt called office today shortly after leaving PT evaluation. Pt reports continued dizziness, HA, and neck pain since performing repeated flexion in standing at end of evaluation. Patient asking if this is normal or anticipated. PT reports that these symptoms are not atypically for 30-45 seconds after described activity, but not normal for persistence as described by patient. Pt reports these symptoms as outside of the scope of her typical symptomology. She denies any suspicious of dehydration. She denies and spinning sensations or vertiginious phenomenon. She denies symptom response to positional changes. Pt encouraged to contact PCP (Dr. Moshe Cipro) shoulder symptoms worsen or become concerningly severe. Pt encouraged to cease Flexion stretches on HEP until next PT visit.   4:13 PM, 11/05/17 Etta Grandchild, PT, DPT Physical Therapist at [place] [phone] (office)

## 2017-11-09 ENCOUNTER — Ambulatory Visit (HOSPITAL_COMMUNITY): Payer: PPO | Admitting: Psychiatry

## 2017-11-10 ENCOUNTER — Telehealth (HOSPITAL_COMMUNITY): Payer: Self-pay

## 2017-11-10 DIAGNOSIS — M533 Sacrococcygeal disorders, not elsewhere classified: Secondary | ICD-10-CM | POA: Diagnosis not present

## 2017-11-10 DIAGNOSIS — R200 Anesthesia of skin: Secondary | ICD-10-CM | POA: Diagnosis not present

## 2017-11-10 DIAGNOSIS — G894 Chronic pain syndrome: Secondary | ICD-10-CM | POA: Diagnosis not present

## 2017-11-10 DIAGNOSIS — M542 Cervicalgia: Secondary | ICD-10-CM | POA: Diagnosis not present

## 2017-11-10 NOTE — Telephone Encounter (Signed)
Patient called to say her MD wants her to stop rehab for now do to her getting really sick after her first eval

## 2017-11-11 ENCOUNTER — Ambulatory Visit (HOSPITAL_COMMUNITY): Payer: PPO

## 2017-11-12 ENCOUNTER — Other Ambulatory Visit: Payer: Self-pay | Admitting: Student

## 2017-11-12 DIAGNOSIS — M542 Cervicalgia: Secondary | ICD-10-CM

## 2017-11-12 DIAGNOSIS — R200 Anesthesia of skin: Secondary | ICD-10-CM

## 2017-11-13 ENCOUNTER — Encounter (HOSPITAL_COMMUNITY): Payer: Self-pay

## 2017-11-16 ENCOUNTER — Other Ambulatory Visit (HOSPITAL_COMMUNITY): Payer: Self-pay | Admitting: Psychiatry

## 2017-11-16 DIAGNOSIS — F331 Major depressive disorder, recurrent, moderate: Secondary | ICD-10-CM

## 2017-11-17 ENCOUNTER — Encounter (HOSPITAL_COMMUNITY): Payer: Self-pay

## 2017-11-17 ENCOUNTER — Other Ambulatory Visit (HOSPITAL_COMMUNITY): Payer: Self-pay

## 2017-11-17 DIAGNOSIS — F331 Major depressive disorder, recurrent, moderate: Secondary | ICD-10-CM

## 2017-11-17 DIAGNOSIS — F411 Generalized anxiety disorder: Secondary | ICD-10-CM

## 2017-11-17 MED ORDER — ZIPRASIDONE HCL 60 MG PO CAPS: 60.0000 mg | ORAL_CAPSULE | Freq: Every day | ORAL | 1 refills | Status: DC

## 2017-11-17 MED ORDER — BUPROPION HCL 75 MG PO TABS: ORAL_TABLET | ORAL | 1 refills | Status: DC

## 2017-11-17 MED ORDER — TEMAZEPAM 22.5 MG PO CAPS: 22.5000 mg | ORAL_CAPSULE | Freq: Every evening | ORAL | 1 refills | Status: DC | PRN

## 2017-11-17 MED ORDER — CLONAZEPAM 0.5 MG PO TABS: 0.5000 mg | ORAL_TABLET | Freq: Two times a day (BID) | ORAL | 1 refills | Status: DC

## 2017-11-19 ENCOUNTER — Encounter (HOSPITAL_COMMUNITY): Payer: Self-pay

## 2017-11-24 ENCOUNTER — Encounter (HOSPITAL_COMMUNITY): Payer: Self-pay

## 2017-11-26 ENCOUNTER — Encounter (HOSPITAL_COMMUNITY): Payer: Self-pay

## 2017-12-01 ENCOUNTER — Encounter (HOSPITAL_COMMUNITY): Payer: Self-pay

## 2017-12-03 ENCOUNTER — Encounter (HOSPITAL_COMMUNITY): Payer: Self-pay

## 2017-12-07 ENCOUNTER — Ambulatory Visit
Admission: RE | Admit: 2017-12-07 | Discharge: 2017-12-07 | Disposition: A | Discharge status: Home / Self Care | Payer: PPO | Source: Ambulatory Visit | Attending: Student | Admitting: Student

## 2017-12-07 DIAGNOSIS — M542 Cervicalgia: Secondary | ICD-10-CM

## 2017-12-07 DIAGNOSIS — R200 Anesthesia of skin: Secondary | ICD-10-CM | POA: Diagnosis not present

## 2017-12-07 DIAGNOSIS — M50223 Other cervical disc displacement at C6-C7 level: Secondary | ICD-10-CM | POA: Diagnosis not present

## 2017-12-07 MED ORDER — GADOBENATE DIMEGLUMINE 529 MG/ML IV SOLN
12.0000 mL | Freq: Once | INTRAVENOUS | Status: AC | PRN
  Administered 2017-12-07: 12 mL via INTRAVENOUS

## 2017-12-08 ENCOUNTER — Encounter (HOSPITAL_COMMUNITY): Payer: Self-pay

## 2017-12-08 ENCOUNTER — Encounter (HOSPITAL_COMMUNITY): Payer: Self-pay | Admitting: Emergency Medicine

## 2017-12-08 ENCOUNTER — Ambulatory Visit (HOSPITAL_COMMUNITY)
Admission: EM | Admit: 2017-12-08 | Discharge: 2017-12-08 | Disposition: A | Discharge status: Home / Self Care | Payer: PPO | Attending: Family Medicine | Admitting: Family Medicine

## 2017-12-08 ENCOUNTER — Other Ambulatory Visit: Payer: Self-pay

## 2017-12-08 DIAGNOSIS — R1031 Right lower quadrant pain: Secondary | ICD-10-CM

## 2017-12-08 DIAGNOSIS — Z3202 Encounter for pregnancy test, result negative: Secondary | ICD-10-CM

## 2017-12-08 LAB — POCT URINALYSIS DIP (DEVICE)
BILIRUBIN URINE: NEGATIVE
Glucose, UA: NEGATIVE mg/dL
HGB URINE DIPSTICK: NEGATIVE
Ketones, ur: NEGATIVE mg/dL
Leukocytes, UA: NEGATIVE
NITRITE: NEGATIVE
PH: 7.5 (ref 5.0–8.0)
Protein, ur: NEGATIVE mg/dL
SPECIFIC GRAVITY, URINE: 1.01 (ref 1.005–1.030)
Urobilinogen, UA: 0.2 mg/dL (ref 0.0–1.0)

## 2017-12-08 LAB — POCT PREGNANCY, URINE: Preg Test, Ur: NEGATIVE

## 2017-12-08 NOTE — Discharge Instructions (Signed)
Continue your stool softener and Gas-X over the next couple days. Schedule appt with your PCP next Tues and cancel if you continue to improve.  Things to look out for: Fevers, worsening signs/symptoms, bleeding, unable to keep anything down.

## 2017-12-08 NOTE — ED Provider Notes (Signed)
  [facility]    CSN: [id number] Arrival date & time: 12/08/17  1449     History   Chief Complaint Chief Complaint  Patient presents with  . Abdominal Pain   Subjective: Patient is a 42 y.o. female here for abd pain.  Started this AM, no inj or change in activity. R suprapubic region. Denies urinary issues. Has chronic pain/neuropathy and takes hydrocodone chronically.  She does have issues with constipation secondary to this.  She has taken a stool softener and had a normal bowel movement.  She denies any nausea, fevers, vomiting, diarrhea, bleeding, or constipation issues.  It was 8/10 pain this morning but is currently a 1/10.  While she was waiting for a provider to enter, her pain spontaneously improved.  She is unsure what she did to help improve it.  She denies any urinary or vaginal complaints otherwise.   ROS: GI: As noted in HPI  Past Medical History:  Diagnosis Date  . Anorexia   . Anxiety   . Benign juvenile melanoma   . Chronic headaches   . Colon polyps    found on colonoscopy 04/26/2012  . Complication of anesthesia    itching after epidural for c section  . Constipation   . Depression   . Depression    several suicide attempts, hospitaluzed in 2012 for this; hx pf ECT treatments ; pt sees Dr. Adele Schilder pyschiatrist  and doing well on medication  . Headache   . Heart murmur    as a child - no one mentions hearing murmur anymore  . Heartburn    no meds  . History of pneumonia    x 3  years ago - no recent problems  . Obesity   . Pneumonia   . Polycystic ovary    takes metformin to treat  . Skin lesion    Excisional biopsy of moles - none cancerous  . Sleep apnea    yrs ago - diagnosed mild sleep apnea - did not have to use cpap     Objective: BP 121/74 (BP Location: Left Arm)   Pulse 96   Temp 98.1 F (36.7 C) (Oral)   Resp 18   SpO2 99%  General: Awake, appears stated age HEENT: MMM, EOMi Heart: RRR, no murmurs Lungs: CTAB, no  rales, wheezes or rhonchi. No accessory muscle use Abd: BS+, soft, mild ttp in RLQ area, not over McBurney's, did feel bowels gurgling upon palpation over this area Psych: Age appropriate judgment and insight, normal affect and mood  Final Clinical Impressions(s) / UC Diagnoses   Final diagnoses:  Right lower quadrant abdominal pain     Discharge Instructions     Continue your stool softener and Gas-X over the next couple days. Schedule appt with your PCP next Tues and cancel if you continue to improve.  Things to look out for: Fevers, worsening signs/symptoms, bleeding, unable to keep anything down.    ED Prescriptions    None     Make an appointment with PCP in 1 week.  Cancel if doing better.  Continue stool softener and Gas-X.  Based off of improvement, likely not anything sinister.  She is not having any fevers or bleeding.  No other red flag signs/symptoms. The patient voiced understanding and agreement to the plan.   Shelda Pal, [place] 12/08/17 [phone]

## 2017-12-08 NOTE — ED Triage Notes (Signed)
Abdominal pain, poor appetite.  Patient had a bm this am after stool softener.    No nausea or vomiting.  No pain with urination.  Right lower quadrant pain.  Intensity has grown as day progresses.

## 2017-12-10 ENCOUNTER — Encounter (HOSPITAL_COMMUNITY): Payer: Self-pay

## 2017-12-15 ENCOUNTER — Encounter (HOSPITAL_COMMUNITY): Payer: Self-pay

## 2017-12-17 ENCOUNTER — Encounter (HOSPITAL_COMMUNITY): Payer: Self-pay

## 2017-12-18 ENCOUNTER — Telehealth (HOSPITAL_COMMUNITY): Payer: Self-pay | Admitting: Psychiatry

## 2017-12-18 DIAGNOSIS — M47816 Spondylosis without myelopathy or radiculopathy, lumbar region: Secondary | ICD-10-CM | POA: Diagnosis not present

## 2017-12-18 DIAGNOSIS — G8929 Other chronic pain: Secondary | ICD-10-CM | POA: Diagnosis not present

## 2017-12-18 DIAGNOSIS — Z79891 Long term (current) use of opiate analgesic: Secondary | ICD-10-CM | POA: Diagnosis not present

## 2017-12-18 DIAGNOSIS — R102 Pelvic and perineal pain: Secondary | ICD-10-CM | POA: Diagnosis not present

## 2017-12-18 DIAGNOSIS — M47812 Spondylosis without myelopathy or radiculopathy, cervical region: Secondary | ICD-10-CM | POA: Diagnosis not present

## 2017-12-18 DIAGNOSIS — M533 Sacrococcygeal disorders, not elsewhere classified: Secondary | ICD-10-CM | POA: Diagnosis not present

## 2017-12-18 DIAGNOSIS — G894 Chronic pain syndrome: Secondary | ICD-10-CM | POA: Diagnosis not present

## 2017-12-18 NOTE — Telephone Encounter (Signed)
Sure. Please schedule for 30 mins follow up.

## 2017-12-21 ENCOUNTER — Ambulatory Visit (HOSPITAL_COMMUNITY): Payer: PPO | Admitting: Licensed Clinical Social Worker

## 2017-12-21 ENCOUNTER — Encounter (HOSPITAL_COMMUNITY): Payer: Self-pay | Admitting: Licensed Clinical Social Worker

## 2017-12-21 DIAGNOSIS — F331 Major depressive disorder, recurrent, moderate: Secondary | ICD-10-CM | POA: Diagnosis not present

## 2017-12-21 NOTE — Progress Notes (Signed)
   THERAPIST PROGRESS NOTE  Session Time: 8:00 am-8:50 am  Participation Level: Active  Behavioral Response: CasualAlertAnxious  Type of Therapy: Individual Therapy  Treatment Goals addressed: Coping  Interventions: CBT and Solution Focused  Summary: Katelyn [name] is a 42 y.o. female who presents oriented x5 (person, place, situation, time and object), alert, average height, thin, casually dressed, appropriately groomed, and cooperative to address mood and anxiety. Patient has a history of medical treatment including bariatric surgery, eating disorder, and chronic pain. Patient has a history of mental health treatment including outpatient therapy and medication management. Patient denies suicidal and homicidal ideations. She denies psychosis including auditory and visual hallucinations. Patient denies substance abuse. She is at low risk for lethality.   Physically: Patient is in a lot of pain. She can't sit, stand or lay down without extreme discomfort. She is losing muscle. Patient has struggled with an eating disorder for several years. She is working with a nutritionist to help get healthy. She acknowledges that  Her health issues are related to anorexia. She has been asked to drink a protein shake daily but has not been. She tried it yesterday and it made her sick.  Spiritually/values: No issues identified.  Relationships: Patient is married. She feels like her husband is supportive of her. She opened up to him about her eating disorder. It was hard for her to do this but he was supportive.  Emotionally/Mentally/Behavior: Patient's mood has been down due to physical pain. She has been eating more but this causes her anxiety due to weight gain which then impacts her eating. Patient acknowledges that she struggles with control. Her physical pain has stopped her from being able to "OCD clean" which is how she normally deals with anxiety. After discussion of things that patient could control,  patient identified that she can join an online support group for eating disorders, weigh herself less during the day due to impacting her ability to eat and the impact it has on her daughter, and drink her protein shake slowly through out the day so she doesn't feel sick.   Patient engaged in session. She responded well to interventions. Patient continues to meet criteria for Major depressive disorder, recurrent episode, moderate. She will continue in outpatient therapy due to being the least restrictive service to meet her needs at this time.   Suicidal/Homicidal: Negativewithout intent/plan  Therapist Response: Therapist reviewed patient's recent thoughts and behaviors. Therapist utilized CBT to address mood. Therapist processed patient's thoughts to identify triggers for mood. Therapist assisted patient in identify small steps she could take to have healthy control in her life.   Plan: Return again in 2 weeks.  Diagnosis: Axis I: Major depressive disorder, recurrent episode, moderate    Axis II: No diagnosis    Glori Bickers, LCSW 12/21/2017

## 2017-12-22 ENCOUNTER — Encounter (HOSPITAL_COMMUNITY): Payer: Self-pay

## 2017-12-24 ENCOUNTER — Ambulatory Visit (HOSPITAL_COMMUNITY): Payer: Self-pay

## 2017-12-25 ENCOUNTER — Ambulatory Visit (HOSPITAL_COMMUNITY): Payer: PPO | Admitting: Psychiatry

## 2018-01-01 DIAGNOSIS — Z124 Encounter for screening for malignant neoplasm of cervix: Secondary | ICD-10-CM | POA: Diagnosis not present

## 2018-01-01 DIAGNOSIS — R1084 Generalized abdominal pain: Secondary | ICD-10-CM | POA: Diagnosis not present

## 2018-01-01 DIAGNOSIS — N760 Acute vaginitis: Secondary | ICD-10-CM | POA: Diagnosis not present

## 2018-01-01 DIAGNOSIS — Z1231 Encounter for screening mammogram for malignant neoplasm of breast: Secondary | ICD-10-CM | POA: Diagnosis not present

## 2018-01-01 DIAGNOSIS — N952 Postmenopausal atrophic vaginitis: Secondary | ICD-10-CM | POA: Diagnosis not present

## 2018-01-01 DIAGNOSIS — R102 Pelvic and perineal pain: Secondary | ICD-10-CM | POA: Diagnosis not present

## 2018-01-03 NOTE — Telephone Encounter (Signed)
No telephone contact made
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
## 2018-01-11 ENCOUNTER — Encounter (HOSPITAL_COMMUNITY): Payer: Self-pay | Admitting: Licensed Clinical Social Worker

## 2018-01-11 ENCOUNTER — Ambulatory Visit (HOSPITAL_COMMUNITY): Payer: PPO | Admitting: Licensed Clinical Social Worker

## 2018-01-11 DIAGNOSIS — F331 Major depressive disorder, recurrent, moderate: Secondary | ICD-10-CM

## 2018-01-11 NOTE — Progress Notes (Signed)
   THERAPIST PROGRESS NOTE  Session Time: 10:00 am-10:45 am  Participation Level: Active  Behavioral Response: CasualAlertAnxious  Type of Therapy: Individual Therapy  Treatment Goals addressed: Coping  Interventions: CBT and Solution Focused  Summary: Katelyn [name] is a 42 y.o. female who presents oriented x5 (person, place, situation, time and object), alert, average height, thin, casually dressed, appropriately groomed, and cooperative to address mood and anxiety. Patient has a history of medical treatment including bariatric surgery, eating disorder, and chronic pain. Patient has a history of mental health treatment including outpatient therapy and medication management. Patient denies suicidal and homicidal ideations. She denies psychosis including auditory and visual hallucinations. Patient denies substance abuse. She is at low risk for lethality.   Physically: Patient continues to be physical pain. Patient is struggling with complications from anorexia that manifested after surgeries that she has had.  Spiritually/values: No issues identified.  Relationships: Patient's relationship with her husband continues to go well. She has limited intimacy issues due to her body image issues.  Emotionally/Mentally/Behavior: Patient's anxiety has increased due to the weight she is gaining. She has a number in her head that is a "safe weight" and she is worried if she goes beyond that number she will start to restrict and diet. Patient has body image issues that impact several aspects of her life. Patient was able to drink her protein shake as she set as a goal and reduce the number of times that she is weighing herself. Patient feels like her next step is to tell her doctor that she doesn't feel like the complications from anorexia are being addressed. She is nervous about speaking up to her doctor because they knew each other before they had a doctor patient relationship. Patient is going to plan  ahead what she will say to her doctor so she won't get anxious and forget and she is going to plan ahead for the wait before seeing her doctor to reduce anxiety.  Patient engaged in session. She responded well to interventions. Patient continues to meet criteria for Major depressive disorder, recurrent episode, moderate. She will continue in outpatient therapy due to being the least restrictive service to meet her needs at this time. Patient made minimal progress on her goals.   Suicidal/Homicidal: Negativewithout intent/plan  Therapist Response: Therapist reviewed patient's recent thoughts and behaviors. Therapist utilized CBT to address mood. Therapist processed patient's thoughts to identify triggers for mood. Therapist assisted patient in identify small steps she could take to reduce her anxiety.   Plan: Return again in 2 weeks.  Diagnosis: Axis I: Major depressive disorder, recurrent episode, moderate    Axis II: No diagnosis    Glori Bickers, LCSW 01/11/2018

## 2018-01-12 ENCOUNTER — Encounter (HOSPITAL_COMMUNITY): Payer: Self-pay | Admitting: Psychiatry

## 2018-01-12 ENCOUNTER — Ambulatory Visit (HOSPITAL_COMMUNITY): Payer: PPO | Admitting: Psychiatry

## 2018-01-12 DIAGNOSIS — Z818 Family history of other mental and behavioral disorders: Secondary | ICD-10-CM | POA: Diagnosis not present

## 2018-01-12 DIAGNOSIS — Z560 Unemployment, unspecified: Secondary | ICD-10-CM | POA: Diagnosis not present

## 2018-01-12 DIAGNOSIS — F331 Major depressive disorder, recurrent, moderate: Secondary | ICD-10-CM

## 2018-01-12 DIAGNOSIS — Z811 Family history of alcohol abuse and dependence: Secondary | ICD-10-CM

## 2018-01-12 DIAGNOSIS — F411 Generalized anxiety disorder: Secondary | ICD-10-CM | POA: Diagnosis not present

## 2018-01-12 MED ORDER — BUPROPION HCL 75 MG PO TABS: ORAL_TABLET | ORAL | 1 refills | Status: DC

## 2018-01-12 MED ORDER — TEMAZEPAM 22.5 MG PO CAPS: 22.5000 mg | ORAL_CAPSULE | Freq: Every evening | ORAL | 1 refills | Status: DC | PRN

## 2018-01-12 MED ORDER — ZIPRASIDONE HCL 60 MG PO CAPS: 60.0000 mg | ORAL_CAPSULE | Freq: Every day | ORAL | 1 refills | Status: DC

## 2018-01-12 MED ORDER — CLONAZEPAM 0.5 MG PO TABS: 0.5000 mg | ORAL_TABLET | Freq: Two times a day (BID) | ORAL | 1 refills | Status: DC

## 2018-01-12 NOTE — Patient Instructions (Signed)
1. Continue Bupropion 150 mg twice a day  2. Continue Geodon 60 mg daily 3. Continue temazepam 22.5 mg at night 4. Continue clonazepam 0.5 mg three twice a day as needed for anxiety 5. Keep the appointment with Dr. Adele Schilder

## 2018-01-13 ENCOUNTER — Ambulatory Visit (INDEPENDENT_AMBULATORY_CARE_PROVIDER_SITE_OTHER): Payer: PPO | Admitting: Family Medicine

## 2018-01-13 ENCOUNTER — Encounter: Payer: Self-pay | Admitting: Family Medicine

## 2018-01-13 VITALS — BP 120/78 | HR 83 | Temp 99.6°F | Resp 15 | Ht 65.5 in | Wt 138.0 lb

## 2018-01-13 DIAGNOSIS — F5000 Anorexia nervosa, unspecified: Secondary | ICD-10-CM | POA: Diagnosis not present

## 2018-01-13 DIAGNOSIS — F333 Major depressive disorder, recurrent, severe with psychotic symptoms: Secondary | ICD-10-CM

## 2018-01-13 DIAGNOSIS — R0789 Other chest pain: Secondary | ICD-10-CM | POA: Diagnosis not present

## 2018-01-13 DIAGNOSIS — Z8371 Family history of colonic polyps: Secondary | ICD-10-CM | POA: Diagnosis not present

## 2018-01-13 DIAGNOSIS — Z1211 Encounter for screening for malignant neoplasm of colon: Secondary | ICD-10-CM | POA: Diagnosis not present

## 2018-01-13 DIAGNOSIS — L819 Disorder of pigmentation, unspecified: Secondary | ICD-10-CM | POA: Diagnosis not present

## 2018-01-13 DIAGNOSIS — E559 Vitamin D deficiency, unspecified: Secondary | ICD-10-CM | POA: Diagnosis not present

## 2018-01-13 DIAGNOSIS — I495 Sick sinus syndrome: Secondary | ICD-10-CM

## 2018-01-13 DIAGNOSIS — E162 Hypoglycemia, unspecified: Secondary | ICD-10-CM | POA: Diagnosis not present

## 2018-01-13 DIAGNOSIS — Z23 Encounter for immunization: Secondary | ICD-10-CM

## 2018-01-13 DIAGNOSIS — I8393 Asymptomatic varicose veins of bilateral lower extremities: Secondary | ICD-10-CM | POA: Diagnosis not present

## 2018-01-13 NOTE — Assessment & Plan Note (Addendum)
Reports recurrent hypoglycemia which she has been told can be life threatening , wakes up to eat at 1 am, needs endo re eval to evaluate and make specific management recommendations as this relates to her anorexia

## 2018-01-13 NOTE — Progress Notes (Signed)
Katelyn [name]     MRN: [id number]      DOB: 03/08/1976   HPI Katelyn [name] is here for follow up and re-evaluation of chronic medical conditions. States that she is concerned that the big picture is not being addressed because of the fact that she has too many Doctors, and the big picture is not being addressed.which is the Eating Disorder. States she has been told by her nutritionist she could  "pass away" in her sleep due to low blood sugar and so she has been advised and has been getting up to eat one time around 1 am, eats at 7am, and again at 4am when she normally wakes up.Started this in July, 2019 Multiple other medical complaints/ all are attributed to malnutrition  ,muscle wasting, bradycardia when lying down and tachycardia on standing, concerned  About circulation in her feet which turn purple on standing , finds this embarrassing , left is worse  Is seeing Gyne and was recently started on vaginal estrogen , she has not started this yet, as she is still symptomatic with burning , is currently on cleocin for root canal, 3 more days left She is concerned that her  Estrogen level was not checked and will f/u with Gyne on this before starting the cream. C/o redness over hips when she lies on stomach which she must do, this goes away 10  Minutes after she is up, but the area remains painful Has upcoming appt at [facility] neurology in October for neuropathy with chronic pain in left lower extremity in particular C/o right lower quadrant /inguinal pain follwing direct  Trauma, has had gyne exam since which is non revealing  ROS See HPI  Denies recent fever or chills. Denies sinus pressure, nasal congestion, ear pain or sore throat. Denies chest congestion, productive cough or wheezing. Denies abdominal pain, nausea, vomiting,diarrhea or constipation.   Denies skin break down or rash.   PE  BP 120/78   Pulse 83   Temp 99.6 F (37.6 C) (Oral)   Resp 15   Ht 5' 5.5" (1.664 m)   Wt 138  lb (62.6 kg)   SpO2 98%   BMI 22.62 kg/m   Patient alert and oriented and in no cardiopulmonary distress.  HEENT: No facial asymmetry, EOMI,   oropharynx pink and moist.  Neck supple no JVD, no mass.  Chest: Clear to auscultation bilaterally.  CVS: S1, S2 no murmurs, no S3.Regular rate.  ABD: Soft . No palpable hernia in area of concern (RLQ) when standing  Ext: No edema    Skin: Intact, no ulcerations or rash noted.  Psych: Good eye contact, normal affect. Memory intact not anxious or depressed appearing.  CNS: CN 2-12 intact, power,  normal throughout.no focal deficits noted.   Assessment & Plan  Eating disorder Followed by nutritionist, states her food intake is now adequate, as far as weight is concerned, but she has no fat or s/c tissue, with muscle wasting and multiple other health concerns attributed to the eating disorder as documented  Depression, major, recurrent, severe with psychosis Managed by Psychiatry and stable  Family hx colonic polyps Colonoscopy is past due , she needs to schedule this  Hypoglycemia Reports recurrent hypoglycemia which she has been told can be life threatening , wakes up to eat at 1 am, needs endo re eval to evaluate and make specific management recommendations as this relates to her anorexia  Tachy-brady syndrome ([place]) Pt c/o  Irregularity in her HR from  bradycardia to tachycardia, and she also c/o chest discomfort. Will refer to cardiology for evaluation   Varicose veins of both lower extremities C/o purple discoloration of both legs on standing which she finds embarrassing and is concerning to her for over 4 months. C/o swollen veins in both lower and upper extremities Will refer to vascular surgery for further evaluation

## 2018-01-13 NOTE — Assessment & Plan Note (Signed)
C/o purple discoloration of both legs on standing which she finds embarrassing and is concerning to her for over 4 months. C/o swollen veins in both lower and upper extremities Will refer to vascular surgery for further evaluation

## 2018-01-13 NOTE — Assessment & Plan Note (Signed)
Pt c/o  Irregularity in her HR from bradycardia to tachycardia, and she also c/o chest discomfort. Will refer to cardiology for evaluation

## 2018-01-13 NOTE — Patient Instructions (Signed)
F/uZ in mid December, call if you ned me before  Flu vaccine today  Please get vit D and TSH  1 week before your next visit  No hernia on exam   Please schedule your colonoscopy, this is overdue and necessary  Please discuss the estrogen lab, pap result and mammogram and ongoing symptoms with gyne    You are referred to endocrinology, Vascular surgery and cardiology as we have discussed

## 2018-01-13 NOTE — Assessment & Plan Note (Signed)
Followed by nutritionist, states her food intake is now adequate, as far as weight is concerned, but she has no fat or s/c tissue, with muscle wasting and multiple other health concerns attributed to the eating disorder as documented

## 2018-01-13 NOTE — Assessment & Plan Note (Signed)
Managed by Psychiatry and stable 

## 2018-01-13 NOTE — Assessment & Plan Note (Addendum)
Colonoscopy is past due , she needs to schedule this, will re enter referral

## 2018-01-15 ENCOUNTER — Telehealth: Payer: Self-pay | Admitting: Family Medicine

## 2018-01-15 ENCOUNTER — Other Ambulatory Visit: Payer: Self-pay

## 2018-01-15 ENCOUNTER — Ambulatory Visit (HOSPITAL_COMMUNITY)
Admission: EM | Admit: 2018-01-15 | Discharge: 2018-01-15 | Disposition: A | Discharge status: Home / Self Care | Payer: PPO | Attending: Family Medicine | Admitting: Family Medicine

## 2018-01-15 ENCOUNTER — Encounter (HOSPITAL_COMMUNITY): Payer: Self-pay | Admitting: Emergency Medicine

## 2018-01-15 DIAGNOSIS — Z880 Allergy status to penicillin: Secondary | ICD-10-CM | POA: Insufficient documentation

## 2018-01-15 DIAGNOSIS — Z87891 Personal history of nicotine dependence: Secondary | ICD-10-CM | POA: Insufficient documentation

## 2018-01-15 DIAGNOSIS — R109 Unspecified abdominal pain: Secondary | ICD-10-CM | POA: Diagnosis present

## 2018-01-15 DIAGNOSIS — G4730 Sleep apnea, unspecified: Secondary | ICD-10-CM | POA: Diagnosis not present

## 2018-01-15 DIAGNOSIS — F411 Generalized anxiety disorder: Secondary | ICD-10-CM | POA: Diagnosis not present

## 2018-01-15 DIAGNOSIS — F331 Major depressive disorder, recurrent, moderate: Secondary | ICD-10-CM | POA: Insufficient documentation

## 2018-01-15 DIAGNOSIS — Z8249 Family history of ischemic heart disease and other diseases of the circulatory system: Secondary | ICD-10-CM | POA: Diagnosis not present

## 2018-01-15 DIAGNOSIS — R1084 Generalized abdominal pain: Secondary | ICD-10-CM | POA: Diagnosis not present

## 2018-01-15 DIAGNOSIS — Z79899 Other long term (current) drug therapy: Secondary | ICD-10-CM | POA: Insufficient documentation

## 2018-01-15 DIAGNOSIS — Z9884 Bariatric surgery status: Secondary | ICD-10-CM | POA: Diagnosis not present

## 2018-01-15 DIAGNOSIS — R630 Anorexia: Secondary | ICD-10-CM | POA: Insufficient documentation

## 2018-01-15 DIAGNOSIS — R197 Diarrhea, unspecified: Secondary | ICD-10-CM | POA: Diagnosis not present

## 2018-01-15 DIAGNOSIS — E282 Polycystic ovarian syndrome: Secondary | ICD-10-CM | POA: Diagnosis not present

## 2018-01-15 LAB — COMPREHENSIVE METABOLIC PANEL
ALK PHOS: 70 U/L (ref 38–126)
ALT: 29 U/L (ref 0–44)
ANION GAP: 9 (ref 5–15)
AST: 23 U/L (ref 15–41)
Albumin: 4.7 g/dL (ref 3.5–5.0)
BUN: 15 mg/dL (ref 6–20)
CALCIUM: 9.5 mg/dL (ref 8.9–10.3)
CO2: 25 mmol/L (ref 22–32)
Chloride: 111 mmol/L (ref 98–111)
Creatinine, Ser: 0.91 mg/dL (ref 0.44–1.00)
Glucose, Bld: 90 mg/dL (ref 70–99)
Potassium: 4.2 mmol/L (ref 3.5–5.1)
Sodium: 145 mmol/L (ref 135–145)
Total Bilirubin: 0.8 mg/dL (ref 0.3–1.2)
Total Protein: 7.3 g/dL (ref 6.5–8.1)

## 2018-01-15 LAB — CBC
HCT: 47.1 % — ABNORMAL HIGH (ref 36.0–46.0)
HEMOGLOBIN: 15.3 g/dL — AB (ref 12.0–15.0)
MCH: 32.9 pg (ref 26.0–34.0)
MCHC: 32.5 g/dL (ref 30.0–36.0)
MCV: 101.3 fL — ABNORMAL HIGH (ref 78.0–100.0)
Platelets: 342 10*3/uL (ref 150–400)
RBC: 4.65 MIL/uL (ref 3.87–5.11)
RDW: 12.6 % (ref 11.5–15.5)
WBC: 9.6 10*3/uL (ref 4.0–10.5)

## 2018-01-15 LAB — LIPASE, BLOOD: Lipase: 45 U/L (ref 11–51)

## 2018-01-15 NOTE — Telephone Encounter (Signed)
Dr Moshe Cipro unavailable- if having severe pain will need UC or ER evaluation

## 2018-01-15 NOTE — ED Triage Notes (Signed)
Pt reports completing her third round of antibiotics in the last month.   She is currently suffering from abdominal pain, bloating and diarrhea.  She states she was exposed to C-Diff recently.  Pt is unable to sit down due to discomfort.

## 2018-01-15 NOTE — Telephone Encounter (Signed)
Clindamycin for 10 days, weight-loss, and diarrhea for 3 days now, severe abdominal pain, fever low grade.

## 2018-01-15 NOTE — Discharge Instructions (Signed)
Please bring back stool kit so that we can send off to check for C. difficile and other causes of infectious diarrhea; please bring back as soon as possible, we have been at 10 AM in the morning Please stop the antibiotics Please use your Zofran as needed for nausea Push fluids, try to drink Pedialyte and/or Gatorade to help replace electrolytes Is able eat a bland diet including toast, rice, applesauce, bananas, crackers, potatoes  If you develop worsening abdominal pain, high fever, increase in lightheadedness or dizziness, weakness please go to emergency room

## 2018-01-16 ENCOUNTER — Telehealth (HOSPITAL_COMMUNITY): Payer: Self-pay | Admitting: Emergency Medicine

## 2018-01-16 NOTE — Telephone Encounter (Signed)
Patient to return stool sample

## 2018-01-16 NOTE — ED Provider Notes (Signed)
[facility]    CSN: [id number] Arrival date & time: 01/15/18  1716     History   Chief Complaint Chief Complaint  Patient presents with  . Diarrhea  . Abdominal Pain    HPI Katelyn [name] is a 42 y.o. female history of anorexia, depression, presenting today for evaluation of abdominal pain and diarrhea.  Patient states that over the past month she has been on a couple courses of antibiotics.  She was initially on Keflex for breast infection and more recently has been on clindamycin for a root canal.  3 days ago she began to have severe abdominal pain, bloating with associated nausea.  She has not had any vomiting, but correlates this to her previous gastric bypass.  She has had dry heaves.  She has also had looser bowel movements, frequent throughout the day.  Occasionally water, occasionally some consistency.  Patient is also concerned that she has had exposure to someone who has had C. difficile.  She states that she has lost 4 pounds in the past 3 days which is concerning for her as she has difficulty gaining weight due to her anorexia.  Denies blood in stool.  She has had low-grade fevers of 99.6.  HPI  Past Medical History:  Diagnosis Date  . Anorexia   . Anxiety   . Benign juvenile melanoma   . Chronic headaches   . Colon polyps    found on colonoscopy 04/26/2012  . Complication of anesthesia    itching after epidural for c section  . Constipation   . Depression   . Depression    several suicide attempts, hospitaluzed in 2012 for this; hx pf ECT treatments ; pt sees Dr. Adele Schilder pyschiatrist  and doing well on medication  . Headache   . Heart murmur    as a child - no one mentions hearing murmur anymore  . Heartburn    no meds  . History of pneumonia    x 3  years ago - no recent problems  . Obesity   . Pneumonia   . Polycystic ovary    takes metformin to treat  . Skin lesion    Excisional biopsy of moles - none cancerous  . Sleep apnea    yrs ago -  diagnosed mild sleep apnea - did not have to use cpap     Patient Active Problem List   Diagnosis Date Noted  . Varicose veins of both lower extremities 01/13/2018  . Generalized pain 10/16/2017  . Irregular menses 10/16/2017  . Neuropathic pain 03/12/2017  . Sacral pain 03/01/2017  . Bariatric surgery status 05/02/2016  . Major depressive disorder, recurrent episode, moderate ([place]) 02/18/2016  . Tachy-brady syndrome ([place]) 12/19/2015  . Iron deficiency anemia 06/10/2015  . Hypoglycemia 04/16/2015  . Sleep disorder 06/01/2013  . Generalized anxiety disorder 05/30/2013  . Chronic headaches 07/01/2012  . Depression, major, recurrent, severe with psychosis ([place]) 06/17/2012  . Family hx colonic polyps 03/03/2012  . Eating disorder 12/18/2011  . POLYCYSTIC OVARIAN DISEASE 09/01/2008    Past Surgical History:  Procedure Laterality Date  . BREAST ENHANCEMENT SURGERY Bilateral 02/11/2017  . BREATH TEK H PYLORI N/A 07/22/2013   Procedure: BREATH TEK H PYLORI;  Surgeon: Edward Jolly, MD;  Location: [facility];  Service: General;  Laterality: N/A;  . c sections  08/28/14   x 2  . CESAREAN SECTION  2004, 2007   x 2  . CYSTOSCOPY WITH URETHRAL DILATATION  age 68   .  GASTRIC ROUX-EN-Y N/A 10/31/2013   Procedure: LAPAROSCOPIC ROUX-EN-Y GASTRIC BYPASS WITH UPPER ENDOSCOPY;  Surgeon: Edward Jolly, MD;  Location: [facility];  Service: General;  Laterality: N/A;  . kidney stent  08/28/14  . mole excision     "benign juvenile melanoma" removed from left leg - inner thigh  . ROUX-EN-Y PROCEDURE  08/28/14  . SKIN LESION EXCISION     back  . TONSILLECTOMY    . TONSILLECTOMY  age 28  . TUBAL LIGATION      OB History    Gravida  0   Para  0   Term  0   Preterm  0   AB  0   Living        SAB  0   TAB  0   Ectopic  0   Multiple      Live Births               Home Medications    Prior to Admission medications   Medication Sig Start Date End Date Taking?  Authorizing Provider  baclofen (LIORESAL) 10 MG tablet Take 10 mg by mouth 3 (three) times daily.   Yes [provider]  buPROPion (WELLBUTRIN) 75 MG tablet Take 2 tab twice a day 01/12/18  Yes Hisada, Elie Goody, MD  clindamycin (CLEOCIN) 300 MG capsule One cap three times daily 01/05/18  Yes [provider]  clonazePAM (KLONOPIN) 0.5 MG tablet Take 1 tablet (0.5 mg total) by mouth 2 (two) times daily. 01/18/18 03/19/18 Yes Hisada, Elie Goody, MD  Cyanocobalamin (B-12) 1000 MCG SUBL Place 1,000 mcg under the tongue daily. 01/01/16  Yes Holley Bouche, NP  gabapentin (NEURONTIN) 600 MG tablet Take 600 mg by mouth 3 (three) times daily.   Yes [provider]  HYDROcodone-acetaminophen (NORCO) 7.5-325 MG tablet Take 1 tablet by mouth every 6 (six) hours as needed for moderate pain.   Yes [provider]  Multiple Vitamin (MULTIVITAMIN WITH MINERALS) TABS tablet Take 3 tablets by mouth 2 (two) times daily.    Yes [provider]  ondansetron (ZOFRAN) 4 MG tablet One tablet as needed, for headache 12/08/16  Yes Fayrene Helper, MD  temazepam (RESTORIL) 22.5 MG capsule Take 1 capsule (22.5 mg total) by mouth at bedtime as needed for sleep. 01/18/18  Yes Norman Clay, MD  topiramate (TOPAMAX) 100 MG tablet Take 1 tablet (100 mg total) by mouth 2 (two) times daily. 10/08/17  Yes Fayrene Helper, MD  ziprasidone (GEODON) 60 MG capsule Take 1 capsule (60 mg total) by mouth at bedtime. 01/12/18  Yes Norman Clay, MD  PREMARIN vaginal cream 2 times a week 01/01/18   [provider]    Family History Family History  Problem Relation Age of Onset  . Hypercholesterolemia Mother   . Hypertension Mother        Iterstitial Cystist  . Hyperlipidemia Mother   . Cancer Mother 53       breast   . Depression Brother   . Alcohol abuse Brother   . Colon polyps Father   . Depression Father   . Irritable bowel syndrome Father   . Alcohol abuse Father   . Colon  cancer Paternal Aunt 18  . Heart attack Paternal Grandfather   . Kidney cancer Paternal Grandfather   . Cancer Maternal Grandfather        unknown type    Social History Social History   Tobacco Use  . Smoking status: Former Smoker  Packs/day: 1.00    Years: 5.00    Pack years: 5.00    Types: Cigarettes    Last attempt to quit: 08/26/1997    Years since quitting: 20.4  . Smokeless tobacco: Never Used  Substance Use Topics  . Alcohol use: No    Alcohol/week: 0.0 standard drinks  . Drug use: No     Allergies   Lyrica [pregabalin]; Feraheme [ferumoxytol]; and Penicillins   Review of Systems Review of Systems  Constitutional: Positive for appetite change and fever. Negative for activity change.  Respiratory: Negative for shortness of breath.   Cardiovascular: Negative for chest pain.  Gastrointestinal: Positive for abdominal pain, diarrhea and nausea. Negative for blood in stool and vomiting.  Genitourinary: Negative for dysuria, flank pain, genital sores, hematuria, menstrual problem, vaginal bleeding, vaginal discharge and vaginal pain.  Musculoskeletal: Negative for back pain.  Skin: Negative for rash.  Neurological: Negative for dizziness, light-headedness and headaches.     Physical Exam Triage Vital Signs ED Triage Vitals  Enc Vitals Group     BP 01/15/18 1729 127/75     Pulse Rate 01/15/18 1729 79     Resp --      Temp 01/15/18 1729 98.8 F (37.1 C)     Temp Source 01/15/18 1729 Oral     SpO2 01/15/18 1729 100 %     Weight --      Height --      Head Circumference --      Peak Flow --      Pain Score 01/15/18 1730 6     Pain Loc --      Pain Edu? --      Excl. in [place]? --    No data found.  Updated Vital Signs BP 127/75 (BP Location: Right Arm)   Pulse 79   Temp 98.8 F (37.1 C) (Oral)   LMP 12/22/2017 (Exact Date)   SpO2 100%   Visual Acuity Right Eye Distance:   Left Eye Distance:   Bilateral Distance:    Right Eye Near:   Left Eye  Near:    Bilateral Near:     Physical Exam  Constitutional: She appears well-developed and well-nourished. No distress.  Patient standing to avoid discomfort from sitting down, this is related to her back and not her abdominal pain; is able to easily ambulate around room  HENT:  Head: Normocephalic and atraumatic.  Mouth/Throat: Oropharynx is clear and moist.  Eyes: Conjunctivae are normal.  Neck: Neck supple.  Cardiovascular: Normal rate and regular rhythm.  No murmur heard. Pulmonary/Chest: Effort normal and breath sounds normal. No respiratory distress.  Breathing comfortably at rest, CTABL, no wheezing, rales or other adventitious sounds auscultated  Abdominal: Soft. There is tenderness.  Abdomen soft, tender generalized throughout all 4 quadrants, no focal tenderness, negative McBurney's, negative rebound, negative Rovsing, negative Murphy's.  Musculoskeletal: She exhibits no edema.  Neurological: She is alert.  Skin: Skin is warm and dry.  Psychiatric: She has a normal mood and affect.  Nursing note and vitals reviewed.    UC Treatments / Results  Labs (all labs ordered are listed, but only abnormal results are displayed) Labs Reviewed  CBC - Abnormal; Notable for the following components:      Result Value   Hemoglobin 15.3 (*)    HCT 47.1 (*)    MCV 101.3 (*)    All other components within normal limits  GASTROINTESTINAL PANEL BY PCR, STOOL (REPLACES STOOL CULTURE)  COMPREHENSIVE METABOLIC PANEL  LIPASE, BLOOD    EKG None  Radiology No results found.  Procedures Procedures (including critical care time)  Medications Ordered in UC Medications - No data to display  Initial Impression / Assessment and Plan / UC Course  I have reviewed the triage vital signs and the nursing notes.  Pertinent labs & imaging results that were available during my care of the patient were reviewed by me and considered in my medical decision making (see chart for details).      Patient with abdominal pain and diarrhea for 3 days, vital signs stable in clinic today, recent antibiotic use.  Will obtain stool sample to check for C. difficile and other GI causes.  No history of diverticulitis.  Negative peritoneal signs.  Zofran for nausea, patient will return stool sample to send off; will call patient with these results and provide treatment for C. difficile if needed.  Pushing fluids.  Go to emergency room if developing worsening abdominal pain, fever, worsening symptoms of dehydration.Discussed strict return precautions. Patient verbalized understanding and is agreeable with plan.  Final Clinical Impressions(s) / UC Diagnoses   Final diagnoses:  Generalized abdominal pain  Diarrhea, unspecified type     Discharge Instructions     Please bring back stool kit so that we can send off to check for C. difficile and other causes of infectious diarrhea; please bring back as soon as possible, we have been at 10 AM in the morning Please stop the antibiotics Please use your Zofran as needed for nausea Push fluids, try to drink Pedialyte and/or Gatorade to help replace electrolytes Is able eat a bland diet including toast, rice, applesauce, bananas, crackers, potatoes  If you develop worsening abdominal pain, high fever, increase in lightheadedness or dizziness, weakness please go to emergency room   ED Prescriptions    None     Controlled Substance Prescriptions [place] Controlled Substance Registry consulted? Not Applicable   Janith Lima, [place] 01/16/18 1001

## 2018-01-19 ENCOUNTER — Telehealth: Payer: Self-pay | Admitting: Family Medicine

## 2018-01-19 DIAGNOSIS — E119 Type 2 diabetes mellitus without complications: Secondary | ICD-10-CM | POA: Diagnosis not present

## 2018-01-19 DIAGNOSIS — E282 Polycystic ovarian syndrome: Secondary | ICD-10-CM | POA: Diagnosis not present

## 2018-01-19 DIAGNOSIS — N643 Galactorrhea not associated with childbirth: Secondary | ICD-10-CM | POA: Diagnosis not present

## 2018-01-19 DIAGNOSIS — E162 Hypoglycemia, unspecified: Secondary | ICD-10-CM | POA: Diagnosis not present

## 2018-01-19 NOTE — Telephone Encounter (Signed)
Patient has had diarrhea for 7 days, today she had blood in her stool.  She went urgent care Friday & they told her it maybe coming from the antibiotics she has been on. Please advise. [phone]/ [phone]

## 2018-01-19 NOTE — Telephone Encounter (Signed)
Spoke with patient and advised that she should be evaluated at urgent care or the closed ED again especially if she is seeing blood in her stool. Patient is against going back to urgent care. She just wants a stool sample sent off to be tested for C Diff. I advised her Dr.Simpson is unavailable and that she could see Dr.Hagler tomorrow morning but Dr.Hagler will not do any treatment without first seeing her. Patient scheduled for an appt at 8am tomorrow morning

## 2018-01-20 ENCOUNTER — Telehealth: Payer: Self-pay | Admitting: Family Medicine

## 2018-01-20 ENCOUNTER — Ambulatory Visit (INDEPENDENT_AMBULATORY_CARE_PROVIDER_SITE_OTHER): Payer: PPO | Admitting: Family Medicine

## 2018-01-20 ENCOUNTER — Encounter: Payer: Self-pay | Admitting: Family Medicine

## 2018-01-20 ENCOUNTER — Telehealth: Payer: Self-pay

## 2018-01-20 ENCOUNTER — Other Ambulatory Visit: Payer: Self-pay

## 2018-01-20 ENCOUNTER — Other Ambulatory Visit (HOSPITAL_COMMUNITY)
Admission: RE | Admit: 2018-01-20 | Discharge: 2018-01-20 | Disposition: A | Discharge status: Home / Self Care | Payer: PPO | Source: Ambulatory Visit | Attending: Family Medicine | Admitting: Family Medicine

## 2018-01-20 VITALS — BP 116/64 | HR 75 | Temp 98.6°F | Resp 12 | Ht 65.5 in | Wt 135.1 lb

## 2018-01-20 DIAGNOSIS — R197 Diarrhea, unspecified: Secondary | ICD-10-CM | POA: Insufficient documentation

## 2018-01-20 DIAGNOSIS — D7589 Other specified diseases of blood and blood-forming organs: Secondary | ICD-10-CM

## 2018-01-20 LAB — C DIFFICILE QUICK SCREEN W PCR REFLEX
C DIFFICILE (CDIFF) TOXIN: NEGATIVE
C DIFFICLE (CDIFF) ANTIGEN: NEGATIVE
C Diff interpretation: NOT DETECTED

## 2018-01-20 NOTE — Telephone Encounter (Signed)
Advised patient of c diff results with verbal understanding. Spoke with Chase at [facility] lab and asked if patient needed to leave another sample or if they could leave sample they already had for new orders. He said they could use current sample. Orders entered with comment added to send results to [facility].

## 2018-01-20 NOTE — Telephone Encounter (Signed)
Patient called to let you know that she dropped off her speciman to the hospital

## 2018-01-20 NOTE — Telephone Encounter (Signed)
Please call patient advised that her stool rapid test for C. difficile was negative.  Please order stool culture and stool PCR pathogen assay for patient.  Please have these labs to come back to Dr. Moshe Cipro, who is her PCP.  Advised patient that if her symptoms worsen or she develops any worrisome signs and symptoms or any subsequent bleeding that she needs to go to the emergency department or urgent care for evaluation.  Advised that if she develops any fevers, chills, nausea, or abdominal pain that she needs to be seen at the urgent care clinic.  She will need to follow-up with her PCP for further management.

## 2018-01-20 NOTE — Telephone Encounter (Signed)
LABS ORDERED PER DR.HAGLER.

## 2018-01-20 NOTE — Telephone Encounter (Signed)
Thank you. We will let [name] know when we have the test results back .Katelyn [name]. [name], MD

## 2018-01-20 NOTE — Telephone Encounter (Signed)
Opened in error. Gwen Her. [name], MD

## 2018-01-20 NOTE — Telephone Encounter (Signed)
Called patient to give results. No answer. Left generic message requesting call back.
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
## 2018-01-21 DIAGNOSIS — F5001 Anorexia nervosa, restricting type: Secondary | ICD-10-CM | POA: Diagnosis not present

## 2018-01-21 DIAGNOSIS — F331 Major depressive disorder, recurrent, moderate: Secondary | ICD-10-CM | POA: Diagnosis not present

## 2018-01-21 DIAGNOSIS — F411 Generalized anxiety disorder: Secondary | ICD-10-CM | POA: Diagnosis not present

## 2018-01-21 LAB — GASTROINTESTINAL PANEL BY PCR, STOOL (REPLACES STOOL CULTURE)
Adenovirus F40/41: NOT DETECTED
Astrovirus: NOT DETECTED
CRYPTOSPORIDIUM: NOT DETECTED
CYCLOSPORA CAYETANENSIS: NOT DETECTED
Campylobacter species: NOT DETECTED
ENTAMOEBA HISTOLYTICA: NOT DETECTED
ENTEROAGGREGATIVE E COLI (EAEC): NOT DETECTED
Enteropathogenic E coli (EPEC): NOT DETECTED
Enterotoxigenic E coli (ETEC): NOT DETECTED
GIARDIA LAMBLIA: NOT DETECTED
Norovirus GI/GII: NOT DETECTED
Plesimonas shigelloides: NOT DETECTED
ROTAVIRUS A: NOT DETECTED
SALMONELLA SPECIES: NOT DETECTED
SHIGELLA/ENTEROINVASIVE E COLI (EIEC): NOT DETECTED
Sapovirus (I, II, IV, and V): NOT DETECTED
Shiga like toxin producing E coli (STEC): NOT DETECTED
Vibrio cholerae: NOT DETECTED
Vibrio species: NOT DETECTED
Yersinia enterocolitica: NOT DETECTED

## 2018-01-22 ENCOUNTER — Telehealth: Payer: Self-pay | Admitting: Family Medicine

## 2018-01-22 DIAGNOSIS — N760 Acute vaginitis: Secondary | ICD-10-CM | POA: Diagnosis not present

## 2018-01-22 DIAGNOSIS — B373 Candidiasis of vulva and vagina: Secondary | ICD-10-CM | POA: Diagnosis not present

## 2018-01-22 NOTE — Telephone Encounter (Signed)
LVM --Wants to know if test results are in

## 2018-01-22 NOTE — Telephone Encounter (Signed)
Called patient to let her know we have not got the results back yet. I will call her as soon as I get a response. She verbalized understanding.

## 2018-01-25 ENCOUNTER — Ambulatory Visit (HOSPITAL_COMMUNITY): Payer: Self-pay | Admitting: Licensed Clinical Social Worker

## 2018-01-25 ENCOUNTER — Encounter: Payer: Self-pay | Admitting: Family Medicine

## 2018-01-25 LAB — STOOL CULTURE: E coli, Shiga toxin Assay: NEGATIVE

## 2018-01-25 LAB — STOOL CULTURE REFLEX - CMPCXR

## 2018-01-25 LAB — STOOL CULTURE REFLEX - RSASHR

## 2018-01-25 NOTE — Telephone Encounter (Signed)
Spoke with patient and advised of test results and that we are waiting on one to report out with verbal understanding. She wishes to wait on the last result before getting a GI consult.

## 2018-01-25 NOTE — Telephone Encounter (Signed)
All stool results available are NEGATIVE , only one outstanding test result is campylobacter, will let her know when this is reported. If c/o loose stool ,/ diarrhea, gI evaluation is recommended. I know Dr Fuller Plan is her Doc please refer her if  She agrees, dx is diarrheah

## 2018-01-26 DIAGNOSIS — F3112 Bipolar disorder, current episode manic without psychotic features, moderate: Secondary | ICD-10-CM | POA: Diagnosis not present

## 2018-01-26 DIAGNOSIS — F411 Generalized anxiety disorder: Secondary | ICD-10-CM | POA: Diagnosis not present

## 2018-01-26 DIAGNOSIS — R630 Anorexia: Secondary | ICD-10-CM | POA: Diagnosis not present

## 2018-01-27 ENCOUNTER — Telehealth (HOSPITAL_COMMUNITY): Payer: Self-pay | Admitting: Licensed Clinical Social Worker

## 2018-01-28 ENCOUNTER — Other Ambulatory Visit: Payer: Self-pay

## 2018-01-28 DIAGNOSIS — I83813 Varicose veins of bilateral lower extremities with pain: Secondary | ICD-10-CM

## 2018-01-29 ENCOUNTER — Inpatient Hospital Stay (HOSPITAL_COMMUNITY): Payer: PPO | Attending: Hematology

## 2018-01-29 ENCOUNTER — Ambulatory Visit: Payer: PPO | Admitting: Cardiovascular Disease

## 2018-01-29 ENCOUNTER — Encounter: Payer: Self-pay | Admitting: Cardiovascular Disease

## 2018-01-29 VITALS — BP 116/68 | HR 79 | Ht 65.5 in | Wt 136.6 lb

## 2018-01-29 DIAGNOSIS — Z85820 Personal history of malignant melanoma of skin: Secondary | ICD-10-CM | POA: Diagnosis not present

## 2018-01-29 DIAGNOSIS — D500 Iron deficiency anemia secondary to blood loss (chronic): Secondary | ICD-10-CM

## 2018-01-29 DIAGNOSIS — E876 Hypokalemia: Secondary | ICD-10-CM | POA: Diagnosis not present

## 2018-01-29 DIAGNOSIS — I499 Cardiac arrhythmia, unspecified: Secondary | ICD-10-CM | POA: Diagnosis not present

## 2018-01-29 DIAGNOSIS — Z87891 Personal history of nicotine dependence: Secondary | ICD-10-CM | POA: Insufficient documentation

## 2018-01-29 DIAGNOSIS — D508 Other iron deficiency anemias: Secondary | ICD-10-CM | POA: Diagnosis not present

## 2018-01-29 DIAGNOSIS — Z79899 Other long term (current) drug therapy: Secondary | ICD-10-CM | POA: Diagnosis not present

## 2018-01-29 DIAGNOSIS — Z9884 Bariatric surgery status: Secondary | ICD-10-CM | POA: Insufficient documentation

## 2018-01-29 DIAGNOSIS — K912 Postsurgical malabsorption, not elsewhere classified: Secondary | ICD-10-CM | POA: Diagnosis not present

## 2018-01-29 LAB — CBC WITH DIFFERENTIAL/PLATELET
BASOS ABS: 0 10*3/uL (ref 0.0–0.1)
BASOS PCT: 0 %
EOS ABS: 0.1 10*3/uL (ref 0.0–0.7)
Eosinophils Relative: 1 %
HCT: 43.9 % (ref 36.0–46.0)
Hemoglobin: 14.5 g/dL (ref 12.0–15.0)
Lymphocytes Relative: 18 %
Lymphs Abs: 1.6 10*3/uL (ref 0.7–4.0)
MCH: 33.1 pg (ref 26.0–34.0)
MCHC: 33 g/dL (ref 30.0–36.0)
MCV: 100.2 fL — ABNORMAL HIGH (ref 78.0–100.0)
MONO ABS: 0.5 10*3/uL (ref 0.1–1.0)
MONOS PCT: 6 %
Neutro Abs: 6.7 10*3/uL (ref 1.7–7.7)
Neutrophils Relative %: 75 %
PLATELETS: 287 10*3/uL (ref 150–400)
RBC: 4.38 MIL/uL (ref 3.87–5.11)
RDW: 13 % (ref 11.5–15.5)
WBC: 9 10*3/uL (ref 4.0–10.5)

## 2018-01-29 LAB — COMPREHENSIVE METABOLIC PANEL
ALBUMIN: 4.7 g/dL (ref 3.5–5.0)
ALT: 33 U/L (ref 0–44)
AST: 27 U/L (ref 15–41)
Alkaline Phosphatase: 66 U/L (ref 38–126)
Anion gap: 8 (ref 5–15)
BUN: 15 mg/dL (ref 6–20)
CO2: 25 mmol/L (ref 22–32)
Calcium: 9.2 mg/dL (ref 8.9–10.3)
Chloride: 109 mmol/L (ref 98–111)
Creatinine, Ser: 0.8 mg/dL (ref 0.44–1.00)
GFR calc Af Amer: >60 mL/min (ref >60)
GFR calc non Af Amer: >60 mL/min (ref >60)
GLUCOSE: 96 mg/dL (ref 70–99)
Potassium: 3.9 mmol/L (ref 3.5–5.1)
SODIUM: 142 mmol/L (ref 135–145)
Total Bilirubin: 0.4 mg/dL (ref 0.3–1.2)
Total Protein: 7.4 g/dL (ref 6.5–8.1)

## 2018-01-29 LAB — LACTATE DEHYDROGENASE: LDH: 150 U/L (ref 98–192)

## 2018-01-29 LAB — FERRITIN: Ferritin: 30 ng/mL (ref 11–307)
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
## 2018-01-29 NOTE — Patient Instructions (Signed)
Medication Instructions: No Changes  If you need a refill on your cardiac medications before your next appointment, please call your pharmacy.   Lab work:None  If you have labs (blood work) drawn today and your tests are completely normal, you will receive your results only by: Marland Kitchen MyChart Message (if you have MyChart) OR . A paper copy in the mail If you have any lab test that is abnormal or we need to change your treatment, we will call you to review the results.  Testing/Procedures:Your physician has recommended that you wear a holter monitor for 48 hours. Holter monitors are medical devices that record the heart's electrical activity. Doctors most often use these monitors to diagnose arrhythmias. Arrhythmias are problems with the speed or rhythm of the heartbeat. The monitor is a small, portable device. You can wear one while you do your normal daily activities. This is usually used to diagnose what is causing palpitations/syncope (passing out).   Your physician has requested that you have an echocardiogram. Echocardiography is a painless test that uses sound waves to create images of your heart. It provides your doctor with information about the size and shape of your heart and how well your heart's chambers and valves are working. This procedure takes approximately one hour. There are no restrictions for this procedure.    Follow-Up: At [facility], you and your health needs are our priority.  As part of our continuing mission to provide you with exceptional heart care, we have created designated Provider Care Teams.  These Care Teams include your primary Cardiologist (physician) and Advanced Practice Providers (APPs -  Physician Assistants and Nurse Practitioners) who all work together to provide you with the care you need, when you need it. .  January or February 2020 with Dr.Koneswaran  Any Other Special Instructions Will Be Listed Below (If Applicable). None       Thank you  for choosing [facility] !

## 2018-02-02 ENCOUNTER — Ambulatory Visit (HOSPITAL_COMMUNITY): Admission: RE | Admit: 2018-02-02 | Payer: PPO | Source: Ambulatory Visit

## 2018-02-02 DIAGNOSIS — N760 Acute vaginitis: Secondary | ICD-10-CM | POA: Diagnosis not present

## 2018-02-02 DIAGNOSIS — F331 Major depressive disorder, recurrent, moderate: Secondary | ICD-10-CM | POA: Diagnosis not present

## 2018-02-02 DIAGNOSIS — F411 Generalized anxiety disorder: Secondary | ICD-10-CM | POA: Diagnosis not present

## 2018-02-02 DIAGNOSIS — F5001 Anorexia nervosa, restricting type: Secondary | ICD-10-CM | POA: Diagnosis not present

## 2018-02-03 ENCOUNTER — Ambulatory Visit (HOSPITAL_BASED_OUTPATIENT_CLINIC_OR_DEPARTMENT_OTHER)
Admission: RE | Admit: 2018-02-03 | Discharge: 2018-02-03 | Disposition: A | Discharge status: Home / Self Care | Payer: PPO | Source: Ambulatory Visit | Attending: Cardiovascular Disease | Admitting: Cardiovascular Disease

## 2018-02-03 ENCOUNTER — Ambulatory Visit (HOSPITAL_COMMUNITY)
Admission: RE | Admit: 2018-02-03 | Discharge: 2018-02-03 | Disposition: A | Discharge status: Home / Self Care | Payer: PPO | Source: Ambulatory Visit | Attending: Cardiovascular Disease | Admitting: Cardiovascular Disease

## 2018-02-03 DIAGNOSIS — I491 Atrial premature depolarization: Secondary | ICD-10-CM | POA: Insufficient documentation

## 2018-02-03 DIAGNOSIS — I493 Ventricular premature depolarization: Secondary | ICD-10-CM | POA: Insufficient documentation

## 2018-02-03 DIAGNOSIS — I499 Cardiac arrhythmia, unspecified: Secondary | ICD-10-CM | POA: Insufficient documentation

## 2018-02-03 DIAGNOSIS — I495 Sick sinus syndrome: Secondary | ICD-10-CM | POA: Diagnosis not present

## 2018-02-03 DIAGNOSIS — I082 Rheumatic disorders of both aortic and tricuspid valves: Secondary | ICD-10-CM | POA: Insufficient documentation

## 2018-02-03 NOTE — Progress Notes (Signed)
*  PRELIMINARY RESULTS* Echocardiogram 2D Echocardiogram has been performed.  Katelyn [name] 02/03/2018, 9:12 AM

## 2018-02-05 ENCOUNTER — Other Ambulatory Visit: Payer: Self-pay

## 2018-02-05 ENCOUNTER — Encounter (HOSPITAL_COMMUNITY): Payer: Self-pay | Admitting: Internal Medicine

## 2018-02-05 ENCOUNTER — Inpatient Hospital Stay (HOSPITAL_BASED_OUTPATIENT_CLINIC_OR_DEPARTMENT_OTHER): Payer: PPO | Admitting: Internal Medicine

## 2018-02-05 VITALS — BP 120/67 | HR 98 | Temp 98.2°F | Resp 16 | Wt 135.5 lb

## 2018-02-05 DIAGNOSIS — Z87891 Personal history of nicotine dependence: Secondary | ICD-10-CM | POA: Diagnosis not present

## 2018-02-05 DIAGNOSIS — Z79899 Other long term (current) drug therapy: Secondary | ICD-10-CM

## 2018-02-05 DIAGNOSIS — D508 Other iron deficiency anemias: Secondary | ICD-10-CM | POA: Diagnosis not present

## 2018-02-05 DIAGNOSIS — E876 Hypokalemia: Secondary | ICD-10-CM | POA: Diagnosis not present

## 2018-02-05 DIAGNOSIS — D500 Iron deficiency anemia secondary to blood loss (chronic): Secondary | ICD-10-CM

## 2018-02-05 DIAGNOSIS — Z85820 Personal history of malignant melanoma of skin: Secondary | ICD-10-CM | POA: Diagnosis not present

## 2018-02-05 DIAGNOSIS — K912 Postsurgical malabsorption, not elsewhere classified: Secondary | ICD-10-CM

## 2018-02-05 DIAGNOSIS — Z9884 Bariatric surgery status: Secondary | ICD-10-CM

## 2018-02-05 NOTE — Progress Notes (Signed)
Diagnosis Iron deficiency anemia due to chronic blood loss - Plan: CBC with Differential/Platelet, Comprehensive metabolic panel, Lactate dehydrogenase, Ferritin, Vitamin B12, Folate, Methylmalonic acid, serum, CBC with Differential/Platelet, Comprehensive metabolic panel, Lactate dehydrogenase, Ferritin  Staging Cancer Staging No matching staging information was found for the patient.  Assessment and Plan:  1.   Iron deficiency anemia.  Patient has undergone gastric bypass so she has difficulty absorbing oral iron.  She was last treated with  IV iron on May 05, 2017 with Injectafer.   Labs done 01/29/2018 reviewed and showed WBC 9 HB 14.5 plts 287,000.  Chemistries WNL with K+ 3.9, Cr 0.8 and normal LFTs.  Ferritin decreased at 30.  Ferritin level has trended downward and pt has option of IV iron.  She desires treatment and she will be treated with 1 dose of Injectafer 750 mg IV and will have repeat labs done 03/2018.  She will be seen for follow-up in 05/2018 with labs.    2.  Nutrient deficiency secondary to gastric bypass.  Continue B12 as directed.  Will check E83, MMA and folic acid in 15/1761.    3.  GI symptoms.  Follow-up with GI and gastric bypass surgeon as directed.  4.  Hypokalemia.  Potassium 3.9 which is WNL. Follow-up with PCP for ongoing monitoring.   5.  Trouble swallowing.  Pt should follow-up with GI or PCP for evaluation.    6.  Health maintenance.  Follow-up with GI as recommended.  Mammogram evaluation is recommended.  Greater than 25 minutes spent with more than 50% spent in counseling and coordination of care.    Interval History:   (From Dr. Donald Pore note on 02/01/16)    Current Status: Patient is seen for follow-up.  She is here today to go over her labs.  She reports trouble swallowing.    Problem List Patient Active Problem List   Diagnosis Date Noted  . Varicose veins of both lower extremities [I83.93] 01/13/2018  . Generalized pain [R52] 10/16/2017   . Irregular menses [N92.6] 10/16/2017  . Neuropathic pain [M79.2] 03/12/2017  . Sacral pain [M53.3] 03/01/2017  . Bariatric surgery status [Z98.84] 05/02/2016  . Major depressive disorder, recurrent episode, moderate ([place]) [F33.1] 02/18/2016  . Tachy-brady syndrome ([place]) [I49.5] 12/19/2015  . Iron deficiency anemia [D50.9] 06/10/2015  . Hypoglycemia [E16.2] 04/16/2015  . Sleep disorder [G47.9] 06/01/2013  . Generalized anxiety disorder [F41.1] 05/30/2013  . Chronic headaches [R51] 07/01/2012  . Depression, major, recurrent, severe with psychosis ([place]) [F33.3] 06/17/2012  . Family hx colonic polyps [Z83.71] 03/03/2012  . Eating disorder [F50.9] 12/18/2011  . POLYCYSTIC OVARIAN DISEASE [E28.2] 09/01/2008    Past Medical History Past Medical History:  Diagnosis Date  . Anorexia   . Anxiety   . Benign juvenile melanoma   . Chronic headaches   . Colon polyps    found on colonoscopy 04/26/2012  . Complication of anesthesia    itching after epidural for c section  . Constipation   . Depression   . Depression    several suicide attempts, hospitaluzed in 2012 for this; hx pf ECT treatments ; pt sees Dr. Adele Schilder pyschiatrist  and doing well on medication  . Headache   . Heart murmur    as a child - no one mentions hearing murmur anymore  . Heartburn    no meds  . History of pneumonia    x 3  years ago - no recent problems  . Obesity   . Pneumonia   .  Polycystic ovary    takes metformin to treat  . Skin lesion    Excisional biopsy of moles - none cancerous  . Sleep apnea    yrs ago - diagnosed mild sleep apnea - did not have to use cpap     Past Surgical History Past Surgical History:  Procedure Laterality Date  . BREAST ENHANCEMENT SURGERY Bilateral 02/11/2017  . BREATH TEK H PYLORI N/A 07/22/2013   Procedure: BREATH TEK H PYLORI;  Surgeon: Edward Jolly, MD;  Location: [facility];  Service: General;  Laterality: N/A;  . c sections  08/28/14   x 2  . CESAREAN  SECTION  2004, 2007   x 2  . CYSTOSCOPY WITH URETHRAL DILATATION  age 23   . GASTRIC ROUX-EN-Y N/A 10/31/2013   Procedure: LAPAROSCOPIC ROUX-EN-Y GASTRIC BYPASS WITH UPPER ENDOSCOPY;  Surgeon: Edward Jolly, MD;  Location: [facility];  Service: General;  Laterality: N/A;  . kidney stent  08/28/14  . mole excision     "benign juvenile melanoma" removed from left leg - inner thigh  . ROUX-EN-Y PROCEDURE  08/28/14  . SKIN LESION EXCISION     back  . TONSILLECTOMY    . TONSILLECTOMY  age 30  . TUBAL LIGATION      Family History Family History  Problem Relation Age of Onset  . Hypercholesterolemia Mother   . Hypertension Mother        Iterstitial Cystist  . Hyperlipidemia Mother   . Cancer Mother 53       breast   . Depression Brother   . Alcohol abuse Brother   . Colon polyps Father   . Depression Father   . Irritable bowel syndrome Father   . Alcohol abuse Father   . Colon cancer Paternal Aunt 43  . Heart attack Paternal Grandfather   . Kidney cancer Paternal Grandfather   . Cancer Maternal Grandfather        unknown type     Social History  reports that she quit smoking about 20 years ago. Her smoking use included cigarettes. She has a 5.00 pack-year smoking history. She has never used smokeless tobacco. She reports that she does not drink alcohol or use drugs.  Medications  Current Outpatient Medications:  .  baclofen (LIORESAL) 10 MG tablet, Take 10 mg by mouth 3 (three) times daily., Disp: , Rfl:  .  buPROPion (WELLBUTRIN) 75 MG tablet, Take 2 tab twice a day, Disp: 120 tablet, Rfl: 1 .  clonazePAM (KLONOPIN) 0.5 MG tablet, Take 1 tablet (0.5 mg total) by mouth 2 (two) times daily., Disp: 60 tablet, Rfl: 1 .  Continuous Blood Gluc Sensor (FREESTYLE LIBRE 14 DAY SENSOR) MISC, USE TO MONITOR BLOOD SUGAR QD FOR 14 DAYS, Disp: , Rfl: 0 .  Cyanocobalamin (B-12) 1000 MCG SUBL, Place 1,000 mcg under the tongue daily., Disp: 30 each, Rfl: 6 .  gabapentin (NEURONTIN) 300 MG  capsule, , Disp: , Rfl:  .  gabapentin (NEURONTIN) 600 MG tablet, Take 600 mg by mouth 3 (three) times daily., Disp: , Rfl:  .  HYDROcodone-acetaminophen (NORCO) 7.5-325 MG tablet, Take 1 tablet by mouth every 6 (six) hours as needed for moderate pain., Disp: , Rfl:  .  Multiple Vitamin (MULTIVITAMIN WITH MINERALS) TABS tablet, Take 3 tablets by mouth 2 (two) times daily. , Disp: , Rfl:  .  ondansetron (ZOFRAN) 4 MG tablet, One tablet as needed, for headache, Disp: 30 tablet, Rfl: 5 .  PREMARIN vaginal cream, 2 times  a week, Disp: , Rfl:  .  temazepam (RESTORIL) 22.5 MG capsule, Take 1 capsule (22.5 mg total) by mouth at bedtime as needed for sleep., Disp: 30 capsule, Rfl: 1 .  topiramate (TOPAMAX) 100 MG tablet, Take 1 tablet (100 mg total) by mouth 2 (two) times daily., Disp: 60 tablet, Rfl: 5 .  ziprasidone (GEODON) 60 MG capsule, Take 1 capsule (60 mg total) by mouth at bedtime., Disp: 30 capsule, Rfl: 1  Allergies Lyrica [pregabalin]; Feraheme [ferumoxytol]; and Penicillins  Review of Systems Review of Systems - Oncology ROS negative other than trouble swallowing.     Physical Exam  Vitals Wt Readings from Last 3 Encounters:  02/05/18 135 lb 8 oz (61.5 kg)  01/29/18 136 lb 9.6 oz (62 kg)  01/20/18 135 lb 1.9 oz (61.3 kg)   Temp Readings from Last 3 Encounters:  02/05/18 98.2 F (36.8 C) (Oral)  01/20/18 98.6 F (37 C) (Temporal)  01/15/18 98.8 F (37.1 C) (Oral)   BP Readings from Last 3 Encounters:  02/05/18 120/67  01/29/18 116/68  01/20/18 116/64   Pulse Readings from Last 3 Encounters:  02/05/18 98  01/29/18 79  01/20/18 75   Constitutional: Well-developed, well-nourished, and in no distress.   HENT: Head: Normocephalic and atraumatic.  Mouth/Throat: No oropharyngeal exudate. Mucosa moist. Eyes: Pupils are equal, round, and reactive to light. Conjunctivae are normal. No scleral icterus.  Neck: Normal range of motion. Neck supple. No JVD present.    Cardiovascular: Normal rate, regular rhythm and normal heart sounds.  Exam reveals no gallop and no friction rub.   No murmur heard. Pulmonary/Chest: Effort normal and breath sounds normal. No respiratory distress. No wheezes.No rales.  Abdominal: Soft. Bowel sounds are normal. No distension. Slightly tender, no guarding.   Musculoskeletal: No edema or tenderness.  Lymphadenopathy: No cervical, axillary or supraclavicular adenopathy.  Neurological: Alert and oriented to person, place, and time. No cranial nerve deficit.  Skin: Skin is warm and dry. No rash noted. No erythema. No pallor.  Psychiatric: Affect and judgment normal.   Labs No visits with results within 3 Day(s) from this visit.  Latest known visit with results is:  Appointment on 01/29/2018  Component Date Value Ref Range Status  . WBC 01/29/2018 9.0  4.0 - 10.5 K/uL Final  . RBC 01/29/2018 4.38  3.87 - 5.11 MIL/uL Final  . Hemoglobin 01/29/2018 14.5  12.0 - 15.0 g/dL Final  . HCT 01/29/2018 43.9  36.0 - 46.0 % Final  . MCV 01/29/2018 100.2* 78.0 - 100.0 fL Final  . MCH 01/29/2018 33.1  26.0 - 34.0 pg Final  . MCHC 01/29/2018 33.0  30.0 - 36.0 g/dL Final  . RDW 01/29/2018 13.0  11.5 - 15.5 % Final  . Platelets 01/29/2018 287  150 - 400 K/uL Final  . Neutrophils Relative % 01/29/2018 75  % Final  . Neutro Abs 01/29/2018 6.7  1.7 - 7.7 K/uL Final  . Lymphocytes Relative 01/29/2018 18  % Final  . Lymphs Abs 01/29/2018 1.6  0.7 - 4.0 K/uL Final  . Monocytes Relative 01/29/2018 6  % Final  . Monocytes Absolute 01/29/2018 0.5  0.1 - 1.0 K/uL Final  . Eosinophils Relative 01/29/2018 1  % Final  . Eosinophils Absolute 01/29/2018 0.1  0.0 - 0.7 K/uL Final  . Basophils Relative 01/29/2018 0  % Final  . Basophils Absolute 01/29/2018 0.0  0.0 - 0.1 K/uL Final   Performed at [facility], [address] [place], [place] [address]  .  Sodium 01/29/2018 142  135 - 145 mmol/L Final  . Potassium 01/29/2018 3.9  3.5 - 5.1 mmol/L Final   . Chloride 01/29/2018 109  98 - 111 mmol/L Final  . CO2 01/29/2018 25  22 - 32 mmol/L Final  . Glucose, Bld 01/29/2018 96  70 - 99 mg/dL Final  . BUN 01/29/2018 15  6 - 20 mg/dL Final  . Creatinine, Ser 01/29/2018 0.80  0.44 - 1.00 mg/dL Final  . Calcium 01/29/2018 9.2  8.9 - 10.3 mg/dL Final  . Total Protein 01/29/2018 7.4  6.5 - 8.1 g/dL Final  . Albumin 01/29/2018 4.7  3.5 - 5.0 g/dL Final  . AST 01/29/2018 27  15 - 41 U/L Final  . ALT 01/29/2018 33  0 - 44 U/L Final  . Alkaline Phosphatase 01/29/2018 66  38 - 126 U/L Final  . Total Bilirubin 01/29/2018 0.4  0.3 - 1.2 mg/dL Final  . GFR calc non Af Amer 01/29/2018 >60  >60 mL/min Final  . GFR calc Af Amer 01/29/2018 >60  >60 mL/min Final   Comment: (NOTE) The eGFR has been calculated using the CKD EPI equation. This calculation has not been validated in all clinical situations. eGFR's persistently <60 mL/min signify possible Chronic Kidney Disease.   Georgiann Hahn gap 01/29/2018 8  5 - 15 Final   Performed at [facility], [address] [place], [place] [address]  . LDH 01/29/2018 150  98 - 192 U/L Final   Performed at [facility], [address], [place], [place] [address]  . Ferritin 01/29/2018 30  11 - 307 ng/mL Final   Performed at [facility], [address], [place],  [address]     Pathology Orders Placed This Encounter  Procedures  . CBC with Differential/Platelet    Standing Status:   Future    Standing Expiration Date:   02/06/2019  . Comprehensive metabolic panel    Standing Status:   Future    Standing Expiration Date:   02/06/2019  . Lactate dehydrogenase    Standing Status:   Future    Standing Expiration Date:   02/06/2019  . Ferritin    Standing Status:   Future    Standing Expiration Date:   02/06/2019  . Vitamin B12    Standing Status:   Future    Standing Expiration Date:   02/06/2019  . Folate    Standing Status:   Future    Standing Expiration Date:   02/06/2019  . Methylmalonic acid, serum     Standing Status:   Future    Standing Expiration Date:   02/06/2019  . CBC with Differential/Platelet    Standing Status:   Future    Standing Expiration Date:   02/06/2020  . Comprehensive metabolic panel    Standing Status:   Future    Standing Expiration Date:   02/06/2020  . Lactate dehydrogenase    Standing Status:   Future    Standing Expiration Date:   02/06/2020  . Ferritin    Standing Status:   Future    Standing Expiration Date:   02/06/2020       Zoila Shutter MD

## 2018-02-08 ENCOUNTER — Ambulatory Visit (HOSPITAL_COMMUNITY): Payer: Self-pay | Admitting: Licensed Clinical Social Worker

## 2018-02-15 DIAGNOSIS — F331 Major depressive disorder, recurrent, moderate: Secondary | ICD-10-CM | POA: Diagnosis not present

## 2018-02-15 DIAGNOSIS — F5001 Anorexia nervosa, restricting type: Secondary | ICD-10-CM | POA: Diagnosis not present

## 2018-02-15 DIAGNOSIS — F411 Generalized anxiety disorder: Secondary | ICD-10-CM | POA: Diagnosis not present

## 2018-02-16 ENCOUNTER — Ambulatory Visit (INDEPENDENT_AMBULATORY_CARE_PROVIDER_SITE_OTHER): Payer: PPO | Admitting: Psychiatry

## 2018-02-16 ENCOUNTER — Encounter (HOSPITAL_COMMUNITY): Payer: Self-pay | Admitting: Psychiatry

## 2018-02-16 VITALS — BP 118/68 | Ht 65.5 in | Wt 136.0 lb

## 2018-02-16 DIAGNOSIS — F509 Eating disorder, unspecified: Secondary | ICD-10-CM

## 2018-02-16 DIAGNOSIS — F411 Generalized anxiety disorder: Secondary | ICD-10-CM

## 2018-02-16 DIAGNOSIS — F331 Major depressive disorder, recurrent, moderate: Secondary | ICD-10-CM

## 2018-02-16 MED ORDER — BUPROPION HCL 75 MG PO TABS: ORAL_TABLET | ORAL | 0 refills | Status: DC

## 2018-02-16 MED ORDER — ZIPRASIDONE HCL 80 MG PO CAPS: 80.0000 mg | ORAL_CAPSULE | Freq: Every day | ORAL | 1 refills | Status: DC

## 2018-02-16 MED ORDER — TEMAZEPAM 22.5 MG PO CAPS: 22.5000 mg | ORAL_CAPSULE | Freq: Every evening | ORAL | 1 refills | Status: DC | PRN
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
## 2018-02-18 ENCOUNTER — Ambulatory Visit (HOSPITAL_COMMUNITY): Payer: Self-pay

## 2018-02-18 DIAGNOSIS — R300 Dysuria: Secondary | ICD-10-CM | POA: Diagnosis not present

## 2018-02-18 DIAGNOSIS — N898 Other specified noninflammatory disorders of vagina: Secondary | ICD-10-CM | POA: Diagnosis not present

## 2018-02-18 DIAGNOSIS — N949 Unspecified condition associated with female genital organs and menstrual cycle: Secondary | ICD-10-CM | POA: Diagnosis not present

## 2018-02-22 ENCOUNTER — Ambulatory Visit (HOSPITAL_COMMUNITY): Payer: Self-pay | Admitting: Licensed Clinical Social Worker

## 2018-02-23 ENCOUNTER — Ambulatory Visit: Payer: Self-pay | Admitting: Family Medicine

## 2018-02-23 DIAGNOSIS — G608 Other hereditary and idiopathic neuropathies: Secondary | ICD-10-CM | POA: Insufficient documentation

## 2018-03-01 ENCOUNTER — Encounter (HOSPITAL_COMMUNITY): Payer: Self-pay

## 2018-03-01 ENCOUNTER — Inpatient Hospital Stay (HOSPITAL_COMMUNITY): Payer: PPO | Attending: Hematology

## 2018-03-01 VITALS — BP 109/70 | HR 76 | Temp 98.5°F | Resp 18 | Wt 140.2 lb

## 2018-03-01 DIAGNOSIS — D508 Other iron deficiency anemias: Secondary | ICD-10-CM | POA: Insufficient documentation

## 2018-03-01 DIAGNOSIS — D509 Iron deficiency anemia, unspecified: Secondary | ICD-10-CM

## 2018-03-01 DIAGNOSIS — Z79899 Other long term (current) drug therapy: Secondary | ICD-10-CM | POA: Diagnosis not present

## 2018-03-01 MED ORDER — SODIUM CHLORIDE 0.9 % IV SOLN
750.0000 mg | Freq: Once | INTRAVENOUS | Status: AC
  Administered 2018-03-01: 750 mg via INTRAVENOUS
  Filled 2018-03-01: qty 15

## 2018-03-01 MED ORDER — SODIUM CHLORIDE 0.9 % IV SOLN
Freq: Once | INTRAVENOUS | Status: AC
  Administered 2018-03-01: 15:00:00 via INTRAVENOUS

## 2018-03-01 NOTE — Progress Notes (Signed)
Katelyn [name] tolerated Injecatfer infusion well without complaints or incident. Pt refused to stay after infusion complete. VSS upon discharge. Peripheral IV site with positive blood return prior to and after infusion complete. Pt discharged self ambulatory in satisfactory condition

## 2018-03-01 NOTE — Patient Instructions (Signed)
[facility] Discharge Instructions  Received Injectafer infusion today. Follow-up as scheduled. Call clinic for any questions or concerns   Thank you for choosing [facility] to provide your oncology and hematology care.  To afford each patient quality time with our provider, please arrive at least 15 minutes before your scheduled appointment time.   If you have a lab appointment with the Cancer Center please come in thru the  Main Entrance and check in at the main information desk  You need to re-schedule your appointment should you arrive 10 or more minutes late.  We strive to give you quality time with our providers, and arriving late affects you and other patients whose appointments are after yours.  Also, if you no show three or more times for appointments you may be dismissed from the clinic at the providers discretion.     Again, thank you for choosing [facility].  Our hope is that these requests will decrease the amount of time that you wait before being seen by our physicians.       _____________________________________________________________  Should you have questions after your visit to [facility], please contact our office at [phone] between the hours of 8:00 a.m. and 4:30 p.m.  Voicemails left after 4:00 p.m. will not be returned until the following business day.  For prescription refill requests, have your pharmacy contact our office and allow 72 hours.    Cancer Center Support Programs:   > Cancer Support Group  2nd Tuesday of the month 1pm-2pm, Journey Room   

## 2018-03-02 DIAGNOSIS — F5001 Anorexia nervosa, restricting type: Secondary | ICD-10-CM | POA: Diagnosis not present

## 2018-03-02 DIAGNOSIS — F331 Major depressive disorder, recurrent, moderate: Secondary | ICD-10-CM | POA: Diagnosis not present

## 2018-03-02 DIAGNOSIS — F411 Generalized anxiety disorder: Secondary | ICD-10-CM | POA: Diagnosis not present

## 2018-03-03 DIAGNOSIS — R200 Anesthesia of skin: Secondary | ICD-10-CM | POA: Diagnosis not present

## 2018-03-03 DIAGNOSIS — R238 Other skin changes: Secondary | ICD-10-CM | POA: Diagnosis not present

## 2018-03-04 ENCOUNTER — Encounter: Payer: Self-pay | Admitting: Family Medicine

## 2018-03-08 ENCOUNTER — Ambulatory Visit: Payer: Self-pay | Admitting: Family Medicine

## 2018-03-08 ENCOUNTER — Ambulatory Visit (HOSPITAL_COMMUNITY): Payer: Self-pay | Admitting: Licensed Clinical Social Worker

## 2018-03-11 ENCOUNTER — Encounter: Payer: Self-pay | Admitting: Family Medicine

## 2018-03-16 DIAGNOSIS — F331 Major depressive disorder, recurrent, moderate: Secondary | ICD-10-CM | POA: Diagnosis not present

## 2018-03-16 DIAGNOSIS — F5001 Anorexia nervosa, restricting type: Secondary | ICD-10-CM | POA: Diagnosis not present

## 2018-03-16 DIAGNOSIS — F411 Generalized anxiety disorder: Secondary | ICD-10-CM | POA: Diagnosis not present

## 2018-03-18 DIAGNOSIS — G8929 Other chronic pain: Secondary | ICD-10-CM | POA: Diagnosis not present

## 2018-03-18 DIAGNOSIS — G629 Polyneuropathy, unspecified: Secondary | ICD-10-CM | POA: Insufficient documentation

## 2018-03-18 DIAGNOSIS — R102 Pelvic and perineal pain: Secondary | ICD-10-CM | POA: Diagnosis not present

## 2018-03-18 DIAGNOSIS — M47816 Spondylosis without myelopathy or radiculopathy, lumbar region: Secondary | ICD-10-CM | POA: Diagnosis not present

## 2018-03-18 DIAGNOSIS — Z79891 Long term (current) use of opiate analgesic: Secondary | ICD-10-CM | POA: Diagnosis not present

## 2018-03-18 DIAGNOSIS — M7918 Myalgia, other site: Secondary | ICD-10-CM | POA: Diagnosis not present

## 2018-03-18 DIAGNOSIS — M533 Sacrococcygeal disorders, not elsewhere classified: Secondary | ICD-10-CM | POA: Diagnosis not present

## 2018-03-18 DIAGNOSIS — G894 Chronic pain syndrome: Secondary | ICD-10-CM | POA: Diagnosis not present

## 2018-03-22 ENCOUNTER — Ambulatory Visit (HOSPITAL_COMMUNITY): Payer: Self-pay | Admitting: Licensed Clinical Social Worker

## 2018-03-29 DIAGNOSIS — E559 Vitamin D deficiency, unspecified: Secondary | ICD-10-CM | POA: Diagnosis not present

## 2018-03-29 DIAGNOSIS — F5000 Anorexia nervosa, unspecified: Secondary | ICD-10-CM | POA: Diagnosis not present

## 2018-03-30 ENCOUNTER — Encounter: Payer: Self-pay | Admitting: Vascular Surgery

## 2018-03-30 ENCOUNTER — Inpatient Hospital Stay (HOSPITAL_COMMUNITY): Admission: RE | Admit: 2018-03-30 | Payer: Self-pay | Source: Ambulatory Visit

## 2018-03-30 ENCOUNTER — Encounter

## 2018-03-30 LAB — VITAMIN D 25 HYDROXY (VIT D DEFICIENCY, FRACTURES): Vit D, 25-Hydroxy: 44 ng/mL (ref 30–100)

## 2018-03-30 LAB — TSH: TSH: 1.38 mIU/L

## 2018-03-31 ENCOUNTER — Ambulatory Visit (HOSPITAL_COMMUNITY): Payer: PPO | Admitting: Psychiatry

## 2018-03-31 DIAGNOSIS — F411 Generalized anxiety disorder: Secondary | ICD-10-CM | POA: Diagnosis not present

## 2018-03-31 DIAGNOSIS — F331 Major depressive disorder, recurrent, moderate: Secondary | ICD-10-CM | POA: Diagnosis not present

## 2018-03-31 DIAGNOSIS — F5001 Anorexia nervosa, restricting type: Secondary | ICD-10-CM | POA: Diagnosis not present

## 2018-04-05 ENCOUNTER — Ambulatory Visit (HOSPITAL_COMMUNITY): Payer: Self-pay | Admitting: Licensed Clinical Social Worker

## 2018-04-06 DIAGNOSIS — M62838 Other muscle spasm: Secondary | ICD-10-CM | POA: Diagnosis not present

## 2018-04-06 DIAGNOSIS — N393 Stress incontinence (female) (male): Secondary | ICD-10-CM | POA: Diagnosis not present

## 2018-04-06 DIAGNOSIS — R102 Pelvic and perineal pain: Secondary | ICD-10-CM | POA: Diagnosis not present

## 2018-04-06 DIAGNOSIS — G8929 Other chronic pain: Secondary | ICD-10-CM | POA: Diagnosis not present

## 2018-04-08 ENCOUNTER — Ambulatory Visit (INDEPENDENT_AMBULATORY_CARE_PROVIDER_SITE_OTHER): Payer: PPO | Admitting: Family Medicine

## 2018-04-08 ENCOUNTER — Encounter: Payer: Self-pay | Admitting: Family Medicine

## 2018-04-08 VITALS — BP 118/64 | HR 72 | Resp 15 | Ht 66.0 in | Wt 138.0 lb

## 2018-04-08 DIAGNOSIS — M79674 Pain in right toe(s): Secondary | ICD-10-CM

## 2018-04-08 DIAGNOSIS — F411 Generalized anxiety disorder: Secondary | ICD-10-CM

## 2018-04-08 DIAGNOSIS — B351 Tinea unguium: Secondary | ICD-10-CM | POA: Diagnosis not present

## 2018-04-08 DIAGNOSIS — R52 Pain, unspecified: Secondary | ICD-10-CM

## 2018-04-08 DIAGNOSIS — F333 Major depressive disorder, recurrent, severe with psychotic symptoms: Secondary | ICD-10-CM | POA: Diagnosis not present

## 2018-04-08 MED ORDER — ONDANSETRON HCL 4 MG PO TABS: 4.0000 mg | ORAL_TABLET | Freq: Three times a day (TID) | ORAL | 0 refills | Status: DC | PRN

## 2018-04-08 MED ORDER — TERBINAFINE HCL 250 MG PO TABS: 250.0000 mg | ORAL_TABLET | Freq: Every day | ORAL | 1 refills | Status: DC

## 2018-04-08 MED ORDER — FLUCONAZOLE 150 MG PO TABS: ORAL_TABLET | ORAL | 0 refills | Status: DC

## 2018-04-08 NOTE — Assessment & Plan Note (Addendum)
2 month h/o right great toe pain, eredfer to Podiatry to assess and manage

## 2018-04-08 NOTE — Patient Instructions (Addendum)
F/U in 4.5 months,, call if you need me before  Medications are prescribed as discussed,. New is 12 weeks of medication for fungal toenail infection  All the best for 2020

## 2018-04-13 DIAGNOSIS — F331 Major depressive disorder, recurrent, moderate: Secondary | ICD-10-CM | POA: Diagnosis not present

## 2018-04-13 DIAGNOSIS — F5001 Anorexia nervosa, restricting type: Secondary | ICD-10-CM | POA: Diagnosis not present

## 2018-04-13 DIAGNOSIS — F411 Generalized anxiety disorder: Secondary | ICD-10-CM | POA: Diagnosis not present

## 2018-04-14 ENCOUNTER — Inpatient Hospital Stay (HOSPITAL_COMMUNITY): Payer: PPO | Attending: Hematology

## 2018-04-14 DIAGNOSIS — D508 Other iron deficiency anemias: Secondary | ICD-10-CM | POA: Diagnosis not present

## 2018-04-14 DIAGNOSIS — D500 Iron deficiency anemia secondary to blood loss (chronic): Secondary | ICD-10-CM

## 2018-04-14 LAB — CBC WITH DIFFERENTIAL/PLATELET
ABS IMMATURE GRANULOCYTES: 0.02 10*3/uL (ref 0.00–0.07)
BASOS PCT: 0 %
Basophils Absolute: 0 10*3/uL (ref 0.0–0.1)
Eosinophils Absolute: 0.1 10*3/uL (ref 0.0–0.5)
Eosinophils Relative: 1 %
HCT: 42.4 % (ref 36.0–46.0)
HEMOGLOBIN: 13.5 g/dL (ref 12.0–15.0)
IMMATURE GRANULOCYTES: 0 %
LYMPHS ABS: 1.5 10*3/uL (ref 0.7–4.0)
LYMPHS PCT: 20 %
MCH: 32.8 pg (ref 26.0–34.0)
MCHC: 31.8 g/dL (ref 30.0–36.0)
MCV: 103.2 fL — AB (ref 80.0–100.0)
MONOS PCT: 4 %
Monocytes Absolute: 0.3 10*3/uL (ref 0.1–1.0)
NEUTROS ABS: 5.5 10*3/uL (ref 1.7–7.7)
NEUTROS PCT: 75 %
Platelets: 283 10*3/uL (ref 150–400)
RBC: 4.11 MIL/uL (ref 3.87–5.11)
RDW: 12.6 % (ref 11.5–15.5)
WBC: 7.4 10*3/uL (ref 4.0–10.5)
nRBC: 0 % (ref 0.0–0.2)

## 2018-04-14 LAB — COMPREHENSIVE METABOLIC PANEL
ALT: 36 U/L (ref 0–44)
AST: 27 U/L (ref 15–41)
Albumin: 4.6 g/dL (ref 3.5–5.0)
Alkaline Phosphatase: 64 U/L (ref 38–126)
Anion gap: 8 (ref 5–15)
BUN: 16 mg/dL (ref 6–20)
CO2: 29 mmol/L (ref 22–32)
CREATININE: 0.79 mg/dL (ref 0.44–1.00)
Calcium: 9.4 mg/dL (ref 8.9–10.3)
Chloride: 102 mmol/L (ref 98–111)
GFR calc Af Amer: >60 mL/min (ref >60)
Glucose, Bld: 91 mg/dL (ref 70–99)
Potassium: 3.9 mmol/L (ref 3.5–5.1)
Sodium: 139 mmol/L (ref 135–145)
Total Bilirubin: 0.5 mg/dL (ref 0.3–1.2)
Total Protein: 7 g/dL (ref 6.5–8.1)

## 2018-04-14 LAB — FERRITIN: Ferritin: 120 ng/mL (ref 11–307)

## 2018-04-14 LAB — LACTATE DEHYDROGENASE: LDH: 216 U/L — AB (ref 98–192)

## 2018-04-14 LAB — FOLATE: Folate: 40.3 ng/mL (ref >5.9)

## 2018-04-14 LAB — VITAMIN B12: Vitamin B-12: 562 pg/mL (ref 180–914)

## 2018-04-15 ENCOUNTER — Ambulatory Visit: Payer: Self-pay | Admitting: Podiatry

## 2018-04-17 ENCOUNTER — Encounter: Payer: Self-pay | Admitting: Family Medicine

## 2018-04-17 DIAGNOSIS — B351 Tinea unguium: Secondary | ICD-10-CM | POA: Insufficient documentation

## 2018-04-17 LAB — METHYLMALONIC ACID, SERUM: Methylmalonic Acid, Quantitative: 186 nmol/L (ref 0–378)

## 2018-04-17 NOTE — Assessment & Plan Note (Signed)
Managed by Pain specialist, uncontrolled

## 2018-04-17 NOTE — Assessment & Plan Note (Signed)
Terbinafine x 12 weeeks

## 2018-04-17 NOTE — Assessment & Plan Note (Signed)
managed by psych, stable

## 2018-04-17 NOTE — Assessment & Plan Note (Signed)
Currently stable , managed by Psychiatry

## 2018-04-17 NOTE — Progress Notes (Signed)
   Katelyn [name]     MRN: [id number]      DOB: [birth year]   HPI Katelyn [name] is here for follow up and re-evaluation of chronic medical conditions, medication management and review of any available recent lab and radiology data.  Preventive health is updated, specifically  Cancer screening and Immunization.   Her appointment at [facility] re pain has provided an answer to the etiology, linking it to anaesthesia, however , her pain remains severe and ucontrolled. She is being managed by a pain specialist,  C/0 recurrent left breast infection and is now contemplating surgery to remove her implants, wonders if they are the source of her problem C/o pain in right great toe, concerned about possible infection  ROS Denies recent fever or chills. Denies sinus pressure, nasal congestion, ear pain or sore throat. Denies chest congestion, productive cough or wheezing. Denies chest pains, palpitations and leg swelling Denies abdominal pain, nausea, vomiting,diarrhea or constipation.   Denies dysuria, frequency, hesitancy or incontinence.  Denies headaches, seizures, numbness, or tingling. C/o  depression, anxiety becuase of pain and infection, seeing psychiatry regularly, not suicidal or homicidal, reports great marriage   PE  BP 118/64   Pulse 72   Resp 15   Ht 5\' 6"  (1.676 m)   Wt 138 lb (62.6 kg)   SpO2 98%   BMI 22.27 kg/m   Patient alert and oriented and in no cardiopulmonary distress.  HEENT: No facial asymmetry, EOMI,   oropharynx pink and moist.  Neck supple no JVD, no mass.  Chest: Clear to auscultation bilaterally.  CVS: S1, S2 no murmurs, no S3.Regular rate.  ABD: Soft non tender.   Ext: No edema  MS: Adequate ROM spine, shoulders, hips and knees.  Skin: Intact, onychomycosis  Psych: Good eye contact,flat affect. Memory intact not anxious or depressed appearing.  CNS: CN 2-12 intact, power,  normal throughout.no focal deficits noted.   Assessment & Plan  Pain of  great toe, right 2 month h/o right great toe pain, eredfer to Podiatry to assess and manage  Onychomycosis of toenail Terbinafine x 12 weeeks  Generalized anxiety disorder managed by psych, stable  Generalized pain Managed by Pain specialist, uncontrolled   Depression, major, recurrent, severe with psychosis Currently stable , managed by Psychiatry

## 2018-04-19 DIAGNOSIS — R102 Pelvic and perineal pain: Secondary | ICD-10-CM | POA: Diagnosis not present

## 2018-04-19 DIAGNOSIS — N393 Stress incontinence (female) (male): Secondary | ICD-10-CM | POA: Diagnosis not present

## 2018-04-19 DIAGNOSIS — G8929 Other chronic pain: Secondary | ICD-10-CM | POA: Diagnosis not present

## 2018-04-19 DIAGNOSIS — M62838 Other muscle spasm: Secondary | ICD-10-CM | POA: Diagnosis not present

## 2018-04-23 ENCOUNTER — Ambulatory Visit: Payer: PPO | Admitting: Vascular Surgery

## 2018-04-23 ENCOUNTER — Other Ambulatory Visit: Payer: Self-pay

## 2018-04-23 ENCOUNTER — Ambulatory Visit (HOSPITAL_COMMUNITY)
Admission: RE | Admit: 2018-04-23 | Discharge: 2018-04-23 | Disposition: A | Discharge status: Home / Self Care | Payer: PPO | Source: Ambulatory Visit | Attending: Vascular Surgery | Admitting: Vascular Surgery

## 2018-04-23 ENCOUNTER — Encounter: Payer: Self-pay | Admitting: Vascular Surgery

## 2018-04-23 DIAGNOSIS — I83813 Varicose veins of bilateral lower extremities with pain: Secondary | ICD-10-CM | POA: Diagnosis not present

## 2018-04-23 DIAGNOSIS — I872 Venous insufficiency (chronic) (peripheral): Secondary | ICD-10-CM | POA: Insufficient documentation
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
## 2018-04-26 ENCOUNTER — Ambulatory Visit (HOSPITAL_COMMUNITY): Admission: EM | Admit: 2018-04-26 | Discharge: 2018-04-26 | Discharge status: Against Medical Advice | Payer: PPO

## 2018-04-26 ENCOUNTER — Ambulatory Visit
Admission: EM | Admit: 2018-04-26 | Discharge: 2018-04-26 | Disposition: A | Discharge status: Home / Self Care | Payer: PPO | Attending: Family Medicine | Admitting: Family Medicine

## 2018-04-26 ENCOUNTER — Ambulatory Visit (HOSPITAL_COMMUNITY): Payer: Self-pay | Admitting: Licensed Clinical Social Worker

## 2018-04-26 ENCOUNTER — Ambulatory Visit: Payer: PPO

## 2018-04-26 DIAGNOSIS — W19XXXA Unspecified fall, initial encounter: Secondary | ICD-10-CM

## 2018-04-26 DIAGNOSIS — R52 Pain, unspecified: Secondary | ICD-10-CM

## 2018-04-26 DIAGNOSIS — S31829A Unspecified open wound of left buttock, initial encounter: Secondary | ICD-10-CM | POA: Diagnosis not present

## 2018-04-26 DIAGNOSIS — M533 Sacrococcygeal disorders, not elsewhere classified: Secondary | ICD-10-CM | POA: Diagnosis not present

## 2018-04-26 DIAGNOSIS — W010XXA Fall on same level from slipping, tripping and stumbling without subsequent striking against object, initial encounter: Secondary | ICD-10-CM

## 2018-04-26 NOTE — ED Triage Notes (Signed)
Pt states that the wait is too long and she is in too much pain so she is going to leave.

## 2018-04-26 NOTE — ED Triage Notes (Signed)
Pt states she fell this am on her buttocks landing on concrete. States has chronic pain with numbness but this is different; denies loc or any other concerns

## 2018-04-26 NOTE — Discharge Instructions (Addendum)
Your x-rays appear to be normal I will call you once the final result is and if there is any fractures You can continue your pain medication regimen at home to treat the symptoms You can add some Aleve for ibuprofen and for anti-inflammatory properties Ice and rest the area and use the pad that you have with you at the clinic If your symptoms continue worsen he will need to follow-up with your doctor

## 2018-04-29 ENCOUNTER — Ambulatory Visit: Payer: PPO | Admitting: Podiatry
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
## 2018-05-06 ENCOUNTER — Ambulatory Visit: Payer: PPO | Admitting: Podiatry

## 2018-05-10 DIAGNOSIS — M6289 Other specified disorders of muscle: Secondary | ICD-10-CM | POA: Diagnosis not present

## 2018-05-10 DIAGNOSIS — M545 Low back pain: Secondary | ICD-10-CM | POA: Diagnosis not present

## 2018-05-10 DIAGNOSIS — N393 Stress incontinence (female) (male): Secondary | ICD-10-CM | POA: Diagnosis not present

## 2018-05-10 DIAGNOSIS — R102 Pelvic and perineal pain: Secondary | ICD-10-CM | POA: Diagnosis not present

## 2018-05-11 ENCOUNTER — Encounter: Payer: Self-pay | Admitting: Cardiovascular Disease

## 2018-05-11 ENCOUNTER — Ambulatory Visit: Payer: PPO | Admitting: Cardiovascular Disease

## 2018-05-11 ENCOUNTER — Telehealth: Payer: Self-pay

## 2018-05-11 VITALS — BP 112/66 | HR 82 | Ht 65.5 in | Wt 138.0 lb

## 2018-05-11 DIAGNOSIS — I499 Cardiac arrhythmia, unspecified: Secondary | ICD-10-CM | POA: Diagnosis not present

## 2018-05-11 NOTE — Telephone Encounter (Signed)
Returned pt's call regarding questions from her u/s of lower legs. Pt concerned (after having read of the u/s report after her appt with Dr. Carlis Abbott) that there was residual thrombus. Explained to her that this could have been residual from one of her previous surgeries and there is no treatment for it. Reminded pt per Dr. Ainsley Spinner note, as he discussed with her at time of her appt., elevation and compression stockings are recommended for deep reflux. Pt. Verbalized understanding and has no further questions at this time. Will call back if anything changes.
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
## 2018-05-11 NOTE — Patient Instructions (Signed)
Medication Instructions:  Your physician recommends that you continue on your current medications as directed. Please refer to the Current Medication list given to you today.   Labwork: NONE  Testing/Procedures: NONE  Follow-Up: Your physician recommends that you schedule a follow-up appointment in: AS NEEDED      Any Other Special Instructions Will Be Listed Below (If Applicable).     If you need a refill on your cardiac medications before your next appointment, please call your pharmacy.   

## 2018-05-13 DIAGNOSIS — F5001 Anorexia nervosa, restricting type: Secondary | ICD-10-CM | POA: Diagnosis not present

## 2018-05-13 DIAGNOSIS — F331 Major depressive disorder, recurrent, moderate: Secondary | ICD-10-CM | POA: Diagnosis not present

## 2018-05-13 DIAGNOSIS — F411 Generalized anxiety disorder: Secondary | ICD-10-CM | POA: Diagnosis not present

## 2018-05-14 ENCOUNTER — Other Ambulatory Visit (HOSPITAL_COMMUNITY): Payer: Self-pay

## 2018-05-14 DIAGNOSIS — F331 Major depressive disorder, recurrent, moderate: Secondary | ICD-10-CM

## 2018-05-14 DIAGNOSIS — F411 Generalized anxiety disorder: Secondary | ICD-10-CM

## 2018-05-14 MED ORDER — BUPROPION HCL 75 MG PO TABS: ORAL_TABLET | ORAL | 0 refills | Status: DC

## 2018-05-14 MED ORDER — ZIPRASIDONE HCL 60 MG PO CAPS: 60.0000 mg | ORAL_CAPSULE | Freq: Every day | ORAL | 0 refills | Status: DC

## 2018-05-17 ENCOUNTER — Other Ambulatory Visit (HOSPITAL_COMMUNITY): Payer: Self-pay

## 2018-05-17 DIAGNOSIS — R102 Pelvic and perineal pain: Secondary | ICD-10-CM | POA: Diagnosis not present

## 2018-05-17 DIAGNOSIS — N393 Stress incontinence (female) (male): Secondary | ICD-10-CM | POA: Diagnosis not present

## 2018-05-17 DIAGNOSIS — M6289 Other specified disorders of muscle: Secondary | ICD-10-CM | POA: Diagnosis not present

## 2018-05-17 DIAGNOSIS — M545 Low back pain: Secondary | ICD-10-CM | POA: Diagnosis not present

## 2018-05-17 MED ORDER — TEMAZEPAM 22.5 MG PO CAPS: 22.5000 mg | ORAL_CAPSULE | Freq: Every day | ORAL | 0 refills | Status: DC

## 2018-05-20 DIAGNOSIS — G894 Chronic pain syndrome: Secondary | ICD-10-CM | POA: Diagnosis not present

## 2018-05-20 DIAGNOSIS — M47816 Spondylosis without myelopathy or radiculopathy, lumbar region: Secondary | ICD-10-CM | POA: Diagnosis not present

## 2018-05-20 DIAGNOSIS — M533 Sacrococcygeal disorders, not elsewhere classified: Secondary | ICD-10-CM | POA: Diagnosis not present

## 2018-05-24 ENCOUNTER — Encounter (HOSPITAL_COMMUNITY): Payer: Self-pay | Admitting: Psychiatry

## 2018-05-24 ENCOUNTER — Ambulatory Visit (INDEPENDENT_AMBULATORY_CARE_PROVIDER_SITE_OTHER): Payer: PPO | Admitting: Psychiatry

## 2018-05-24 VITALS — BP 139/77 | HR 84 | Ht 65.5 in | Wt 139.0 lb

## 2018-05-24 DIAGNOSIS — F331 Major depressive disorder, recurrent, moderate: Secondary | ICD-10-CM

## 2018-05-24 DIAGNOSIS — F411 Generalized anxiety disorder: Secondary | ICD-10-CM | POA: Diagnosis not present

## 2018-05-24 DIAGNOSIS — F509 Eating disorder, unspecified: Secondary | ICD-10-CM | POA: Diagnosis not present

## 2018-05-24 MED ORDER — TEMAZEPAM 22.5 MG PO CAPS: 22.5000 mg | ORAL_CAPSULE | Freq: Every day | ORAL | 0 refills | Status: DC

## 2018-05-24 MED ORDER — CLONAZEPAM 0.5 MG PO TABS: 0.5000 mg | ORAL_TABLET | Freq: Every day | ORAL | 0 refills | Status: DC

## 2018-05-24 MED ORDER — ZIPRASIDONE HCL 60 MG PO CAPS: 60.0000 mg | ORAL_CAPSULE | Freq: Every day | ORAL | 0 refills | Status: DC

## 2018-05-24 MED ORDER — DULOXETINE HCL 20 MG PO CPEP: ORAL_CAPSULE | ORAL | 0 refills | Status: DC

## 2018-05-24 MED ORDER — BUPROPION HCL 75 MG PO TABS: ORAL_TABLET | ORAL | 0 refills | Status: DC
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
## 2018-05-24 NOTE — Patient Instructions (Signed)
Start cymbalta 20 mg daily for one week and than twice daily. Start reducing wellbutrin to one tab for week and than stopped. Call us if you have any questions.

## 2018-05-25 DIAGNOSIS — M6289 Other specified disorders of muscle: Secondary | ICD-10-CM | POA: Diagnosis not present

## 2018-05-25 DIAGNOSIS — N393 Stress incontinence (female) (male): Secondary | ICD-10-CM | POA: Diagnosis not present

## 2018-05-25 DIAGNOSIS — R102 Pelvic and perineal pain: Secondary | ICD-10-CM | POA: Diagnosis not present

## 2018-05-25 DIAGNOSIS — M545 Low back pain: Secondary | ICD-10-CM | POA: Diagnosis not present

## 2018-05-27 DIAGNOSIS — F411 Generalized anxiety disorder: Secondary | ICD-10-CM | POA: Diagnosis not present

## 2018-05-27 DIAGNOSIS — F5001 Anorexia nervosa, restricting type: Secondary | ICD-10-CM | POA: Diagnosis not present

## 2018-05-27 DIAGNOSIS — F331 Major depressive disorder, recurrent, moderate: Secondary | ICD-10-CM | POA: Diagnosis not present

## 2018-06-01 DIAGNOSIS — N393 Stress incontinence (female) (male): Secondary | ICD-10-CM | POA: Diagnosis not present

## 2018-06-01 DIAGNOSIS — M6289 Other specified disorders of muscle: Secondary | ICD-10-CM | POA: Diagnosis not present

## 2018-06-01 DIAGNOSIS — R278 Other lack of coordination: Secondary | ICD-10-CM | POA: Diagnosis not present

## 2018-06-01 DIAGNOSIS — R102 Pelvic and perineal pain: Secondary | ICD-10-CM | POA: Diagnosis not present

## 2018-06-01 DIAGNOSIS — M6281 Muscle weakness (generalized): Secondary | ICD-10-CM | POA: Diagnosis not present

## 2018-06-01 DIAGNOSIS — M533 Sacrococcygeal disorders, not elsewhere classified: Secondary | ICD-10-CM | POA: Diagnosis not present

## 2018-06-01 DIAGNOSIS — M545 Low back pain: Secondary | ICD-10-CM | POA: Diagnosis not present

## 2018-06-08 DIAGNOSIS — M6281 Muscle weakness (generalized): Secondary | ICD-10-CM | POA: Diagnosis not present

## 2018-06-08 DIAGNOSIS — R278 Other lack of coordination: Secondary | ICD-10-CM | POA: Diagnosis not present

## 2018-06-08 DIAGNOSIS — M6289 Other specified disorders of muscle: Secondary | ICD-10-CM | POA: Diagnosis not present

## 2018-06-08 DIAGNOSIS — R102 Pelvic and perineal pain: Secondary | ICD-10-CM | POA: Diagnosis not present

## 2018-06-08 DIAGNOSIS — M545 Low back pain: Secondary | ICD-10-CM | POA: Diagnosis not present

## 2018-06-08 DIAGNOSIS — M533 Sacrococcygeal disorders, not elsewhere classified: Secondary | ICD-10-CM | POA: Diagnosis not present

## 2018-06-08 DIAGNOSIS — N393 Stress incontinence (female) (male): Secondary | ICD-10-CM | POA: Diagnosis not present

## 2018-06-09 DIAGNOSIS — F331 Major depressive disorder, recurrent, moderate: Secondary | ICD-10-CM | POA: Diagnosis not present

## 2018-06-09 DIAGNOSIS — F411 Generalized anxiety disorder: Secondary | ICD-10-CM | POA: Diagnosis not present

## 2018-06-09 DIAGNOSIS — F5001 Anorexia nervosa, restricting type: Secondary | ICD-10-CM | POA: Diagnosis not present

## 2018-06-11 ENCOUNTER — Telehealth (HOSPITAL_COMMUNITY): Payer: Self-pay

## 2018-06-11 NOTE — Telephone Encounter (Signed)
10 patient's phone call.  She took Cymbalta for 7 days but started to have nausea, vomiting, headache and threw up.  She stopped the Cymbalta.  She resume her Wellbutrin.  She also took Klonopin that helped her today.  She admitted depressed but no suicidal thoughts or any plan.  She is trying to keep herself busy and staying most of the time with her family members.  I recommended if do not feel improvement by early next week then she should call us back.  I will also put Cymbalta in her allergy section.  I discussed that anytime having active suicidal thoughts or plan then she should call 911 or go to the emergency room.  Patient agreed with the plan.

## 2018-06-11 NOTE — Telephone Encounter (Signed)
Patient called to report that the Cymbalta sent her into a depression. She took it for 10 days, she had headaches and nausea. She stopped it and started back on her Wellbutrin. Patient is very tearful and depressed, she wants to know what to do. Please review and advise, thank you

## 2018-06-14 ENCOUNTER — Inpatient Hospital Stay (HOSPITAL_COMMUNITY): Payer: PPO | Attending: Hematology

## 2018-06-14 DIAGNOSIS — Z79899 Other long term (current) drug therapy: Secondary | ICD-10-CM | POA: Insufficient documentation

## 2018-06-14 DIAGNOSIS — D500 Iron deficiency anemia secondary to blood loss (chronic): Secondary | ICD-10-CM

## 2018-06-14 DIAGNOSIS — D509 Iron deficiency anemia, unspecified: Secondary | ICD-10-CM | POA: Diagnosis not present

## 2018-06-14 DIAGNOSIS — Z9884 Bariatric surgery status: Secondary | ICD-10-CM | POA: Diagnosis not present

## 2018-06-14 DIAGNOSIS — E876 Hypokalemia: Secondary | ICD-10-CM | POA: Insufficient documentation

## 2018-06-14 LAB — CBC WITH DIFFERENTIAL/PLATELET
Abs Immature Granulocytes: 0.02 10*3/uL (ref 0.00–0.07)
Basophils Absolute: 0.1 10*3/uL (ref 0.0–0.1)
Basophils Relative: 1 %
Eosinophils Absolute: 0 10*3/uL (ref 0.0–0.5)
Eosinophils Relative: 1 %
HCT: 44.1 % (ref 36.0–46.0)
Hemoglobin: 14.2 g/dL (ref 12.0–15.0)
Immature Granulocytes: 0 %
Lymphocytes Relative: 23 %
Lymphs Abs: 1.7 10*3/uL (ref 0.7–4.0)
MCH: 32.8 pg (ref 26.0–34.0)
MCHC: 32.2 g/dL (ref 30.0–36.0)
MCV: 101.8 fL — AB (ref 80.0–100.0)
Monocytes Absolute: 0.4 10*3/uL (ref 0.1–1.0)
Monocytes Relative: 5 %
Neutro Abs: 5.3 10*3/uL (ref 1.7–7.7)
Neutrophils Relative %: 70 %
Platelets: 248 10*3/uL (ref 150–400)
RBC: 4.33 MIL/uL (ref 3.87–5.11)
RDW: 12.5 % (ref 11.5–15.5)
WBC: 7.5 10*3/uL (ref 4.0–10.5)
nRBC: 0 % (ref 0.0–0.2)

## 2018-06-14 LAB — COMPREHENSIVE METABOLIC PANEL
ALT: 39 U/L (ref 0–44)
AST: 30 U/L (ref 15–41)
Albumin: 4.7 g/dL (ref 3.5–5.0)
Alkaline Phosphatase: 57 U/L (ref 38–126)
Anion gap: 9 (ref 5–15)
BUN: 15 mg/dL (ref 6–20)
CO2: 26 mmol/L (ref 22–32)
CREATININE: 0.77 mg/dL (ref 0.44–1.00)
Calcium: 9.3 mg/dL (ref 8.9–10.3)
Chloride: 106 mmol/L (ref 98–111)
Glucose, Bld: 90 mg/dL (ref 70–99)
Potassium: 3.9 mmol/L (ref 3.5–5.1)
Sodium: 141 mmol/L (ref 135–145)
Total Bilirubin: 0.3 mg/dL (ref 0.3–1.2)
Total Protein: 7.2 g/dL (ref 6.5–8.1)

## 2018-06-14 LAB — LACTATE DEHYDROGENASE: LDH: 178 U/L (ref 98–192)

## 2018-06-14 LAB — FERRITIN: FERRITIN: 100 ng/mL (ref 11–307)

## 2018-06-15 DIAGNOSIS — R278 Other lack of coordination: Secondary | ICD-10-CM | POA: Diagnosis not present

## 2018-06-15 DIAGNOSIS — R102 Pelvic and perineal pain: Secondary | ICD-10-CM | POA: Diagnosis not present

## 2018-06-15 DIAGNOSIS — M6289 Other specified disorders of muscle: Secondary | ICD-10-CM | POA: Diagnosis not present

## 2018-06-15 DIAGNOSIS — M6281 Muscle weakness (generalized): Secondary | ICD-10-CM | POA: Diagnosis not present

## 2018-06-15 DIAGNOSIS — M533 Sacrococcygeal disorders, not elsewhere classified: Secondary | ICD-10-CM | POA: Diagnosis not present

## 2018-06-15 DIAGNOSIS — M545 Low back pain: Secondary | ICD-10-CM | POA: Diagnosis not present

## 2018-06-15 DIAGNOSIS — N393 Stress incontinence (female) (male): Secondary | ICD-10-CM | POA: Diagnosis not present

## 2018-06-21 ENCOUNTER — Encounter (HOSPITAL_COMMUNITY): Payer: Self-pay | Admitting: Internal Medicine

## 2018-06-21 ENCOUNTER — Inpatient Hospital Stay (HOSPITAL_COMMUNITY): Payer: PPO | Admitting: Internal Medicine

## 2018-06-21 ENCOUNTER — Telehealth (HOSPITAL_COMMUNITY): Payer: Self-pay | Admitting: *Deleted

## 2018-06-21 ENCOUNTER — Other Ambulatory Visit: Payer: Self-pay

## 2018-06-21 ENCOUNTER — Other Ambulatory Visit (HOSPITAL_COMMUNITY): Payer: Self-pay | Admitting: Internal Medicine

## 2018-06-21 VITALS — BP 128/80 | HR 85 | Temp 98.7°F | Resp 16 | Wt 140.0 lb

## 2018-06-21 DIAGNOSIS — D509 Iron deficiency anemia, unspecified: Secondary | ICD-10-CM

## 2018-06-21 DIAGNOSIS — Z79899 Other long term (current) drug therapy: Secondary | ICD-10-CM | POA: Diagnosis not present

## 2018-06-21 DIAGNOSIS — Z9884 Bariatric surgery status: Secondary | ICD-10-CM

## 2018-06-21 DIAGNOSIS — E876 Hypokalemia: Secondary | ICD-10-CM | POA: Diagnosis not present

## 2018-06-21 DIAGNOSIS — D519 Vitamin B12 deficiency anemia, unspecified: Secondary | ICD-10-CM | POA: Insufficient documentation

## 2018-06-21 DIAGNOSIS — D500 Iron deficiency anemia secondary to blood loss (chronic): Secondary | ICD-10-CM

## 2018-06-21 MED ORDER — CYANOCOBALAMIN 1000 MCG/ML IJ SOLN
1000.0000 ug | Freq: Once | INTRAMUSCULAR | Status: AC
  Administered 2018-06-21: 1000 ug via INTRAMUSCULAR

## 2018-06-21 MED ORDER — CYANOCOBALAMIN 1000 MCG/ML IJ SOLN
INTRAMUSCULAR | Status: AC
  Filled 2018-06-21: qty 1

## 2018-06-21 NOTE — Telephone Encounter (Signed)
Pt contacted the clinic c/o right arm numbness/tingling after rec'ing B12 injection in clinic today.  Reports that the numbness/tingling began in her right arm as she was driving home from her appt, then traveled to her right lower extremity; also reports her right eye became numb.  States she developed a rash to her chest.  Also c/o SOB and "heart racing". Pt has pulse oximeter at home and reports her current SpO2 is 97% with a pulse rate ranging from in the 90s to 106.  Reports all s/s resolved without intervention except feeling short of breath and feeling as though her heart is racing.  Instructed pt to present to the ED for her SOB and heart racing. Informed Dr. Walden Field of the above and she is in agreement with my advice to pt.

## 2018-06-21 NOTE — Progress Notes (Signed)
Katelyn [name] presents today for injection per the provider's orders.  B12 administration without incident; see MAR for injection details.  Patient tolerated procedure well and without incident.  No questions or complaints noted at this time. Pt d/c ambulatory

## 2018-06-21 NOTE — Progress Notes (Signed)
Diagnosis No diagnosis found.  Staging Cancer Staging No matching staging information was found for the patient.  Assessment and Plan:  1.   Iron deficiency anemia.  Patient has undergone gastric bypass so she has difficulty absorbing oral iron.  She was last treated with  IV iron on 03/01/2018 with Injectafer.   Labs done 06/14/2018 reviewed and showed WBC 7.5 HB 14.2 plts 248,000.  Chemistries WNL with K+ 3.9 Cr 0.77 and normal LFTs.  Ferritin 100. I discussed with her iron and hb levels remain normal after IV iron. She will have repeat labs in 09/2018.  2.  Nutrient deficiency secondary to gastric bypass.  Pt is on sublingual B12.  She is requesting B12 injections. She has normal I95, MMA and folic acid in 18/8416.  She will begin monthly B12 1000 mcg injections.    3.  GI symptoms.  Follow-up with GI and gastric bypass surgeon as directed.  4.  Hypokalemia.  Potassium 3.9 which is WNL. Follow-up with PCP for ongoing monitoring.   5.  Health maintenance.  Follow-up with GI as recommended.  Mammogram evaluation is recommended.  Interval History:   (From Dr. Donald Pore note on 02/01/16)    Current Status: Patient is seen for follow-up.  She is here today to go over her labs.  She is requesting B12 injections.    Problem List Patient Active Problem List   Diagnosis Date Noted  . Venous insufficiency [I87.2] 04/23/2018  . Onychomycosis of toenail [B35.1] 04/17/2018  . Pain of great toe, right [M79.674] 04/08/2018  . Varicose veins of both lower extremities [I83.93] 01/13/2018  . Generalized pain [R52] 10/16/2017  . Irregular menses [N92.6] 10/16/2017  . Neuropathic pain [M79.2] 03/12/2017  . Sacral pain [M53.3] 03/01/2017  . Bariatric surgery status [Z98.84] 05/02/2016  . Major depressive disorder, recurrent episode, moderate ([place]) [F33.1] 02/18/2016  . Tachy-brady syndrome ([place]) [I49.5] 12/19/2015  . Iron deficiency anemia [D50.9] 06/10/2015  . Hypoglycemia [E16.2] 04/16/2015    . Sleep disorder [G47.9] 06/01/2013  . Generalized anxiety disorder [F41.1] 05/30/2013  . Chronic headaches [R51] 07/01/2012  . Depression, major, recurrent, severe with psychosis ([place]) [F33.3] 06/17/2012  . Family hx colonic polyps [Z83.71] 03/03/2012  . Eating disorder [F50.9] 12/18/2011  . POLYCYSTIC OVARIAN DISEASE [E28.2] 09/01/2008    Past Medical History Past Medical History:  Diagnosis Date  . Anorexia   . Anxiety   . Benign juvenile melanoma   . Chronic headaches   . Colon polyps    found on colonoscopy 04/26/2012  . Complication of anesthesia    itching after epidural for c section  . Constipation   . Depression   . Depression    several suicide attempts, hospitaluzed in 2012 for this; hx pf ECT treatments ; pt sees Dr. Adele Schilder pyschiatrist  and doing well on medication  . Headache   . Heart murmur    as a child - no one mentions hearing murmur anymore  . Heartburn    no meds  . History of pneumonia    x 3  years ago - no recent problems  . Obesity   . Pneumonia   . Polycystic ovary    takes metformin to treat  . Skin lesion    Excisional biopsy of moles - none cancerous  . Sleep apnea    yrs ago - diagnosed mild sleep apnea - did not have to use cpap     Past Surgical History Past Surgical History:  Procedure Laterality Date  . BREAST  ENHANCEMENT SURGERY Bilateral 02/11/2017  . BREATH TEK H PYLORI N/A 07/22/2013   Procedure: BREATH TEK H PYLORI;  Surgeon: Edward Jolly, MD;  [facility];  Service: General;  Laterality: N/A;  . c sections  08/28/14   x 2  . CESAREAN SECTION  2004, 2007   x 2  . CYSTOSCOPY WITH URETHRAL DILATATION  age 74   . GASTRIC ROUX-EN-Y N/A 10/31/2013   Procedure: LAPAROSCOPIC ROUX-EN-Y GASTRIC BYPASS WITH UPPER ENDOSCOPY;  Surgeon: Edward Jolly, MD;  Location: [facility];  Service: General;  Laterality: N/A;  . kidney stent  08/28/14  . mole excision     "benign juvenile melanoma" removed from left leg - inner  thigh  . ROUX-EN-Y PROCEDURE  08/28/14  . SKIN LESION EXCISION     back  . TONSILLECTOMY    . TONSILLECTOMY  age 32  . TUBAL LIGATION      Family History Family History  Problem Relation Age of Onset  . Hypercholesterolemia Mother   . Hypertension Mother        Iterstitial Cystist  . Hyperlipidemia Mother   . Cancer Mother 42       breast   . Depression Brother   . Alcohol abuse Brother   . Colon polyps Father   . Depression Father   . Irritable bowel syndrome Father   . Alcohol abuse Father   . Colon cancer Paternal Aunt 84  . Heart attack Paternal Grandfather   . Kidney cancer Paternal Grandfather   . Cancer Maternal Grandfather        unknown type     Social History  reports that she quit smoking about 20 years ago. Her smoking use included cigarettes. She has a 5.00 pack-year smoking history. She has never used smokeless tobacco. She reports that she does not drink alcohol or use drugs.  Medications  Current Outpatient Medications:  .  baclofen (LIORESAL) 10 MG tablet, Take 10 mg by mouth 3 (three) times daily., Disp: , Rfl:  .  buPROPion (WELLBUTRIN) 75 MG tablet, Take 2 tab in AM, Disp: 60 tablet, Rfl: 0 .  clonazePAM (KLONOPIN) 0.5 MG tablet, Take 1 tablet (0.5 mg total) by mouth daily., Disp: 30 tablet, Rfl: 0 .  Cyanocobalamin (B-12) 1000 MCG SUBL, Place 1,000 mcg under the tongue daily., Disp: 30 each, Rfl: 6 .  gabapentin (NEURONTIN) 600 MG tablet, Take 600 mg by mouth 3 (three) times daily., Disp: , Rfl:  .  HYDROcodone-acetaminophen (NORCO) 7.5-325 MG tablet, Take 1 tablet by mouth every 6 (six) hours as needed for moderate pain., Disp: , Rfl:  .  Multiple Vitamin (MULTIVITAMIN WITH MINERALS) TABS tablet, Take 3 tablets by mouth 2 (two) times daily. , Disp: , Rfl:  .  PREMARIN vaginal cream, 2 times a week, Disp: , Rfl:  .  temazepam (RESTORIL) 22.5 MG capsule, Take 1 capsule (22.5 mg total) by mouth at bedtime., Disp: 30 capsule, Rfl: 0 .  terbinafine  (LAMISIL) 250 MG tablet, Take 1 tablet (250 mg total) by mouth daily., Disp: 42 tablet, Rfl: 1 .  ziprasidone (GEODON) 60 MG capsule, Take 1 capsule (60 mg total) by mouth at bedtime., Disp: 30 capsule, Rfl: 0 .  fluconazole (DIFLUCAN) 150 MG tablet, One tablet once daily , as needed, for vaginal itch from antibiotic use (Patient not taking: Reported on 06/21/2018), Disp: 4 tablet, Rfl: 0 .  metroNIDAZOLE (METROGEL) 0.75 % vaginal gel, , Disp: , Rfl:  .  naloxone (NARCAN) nasal  spray 4 mg/0.1 mL, Place into the nose., Disp: , Rfl:  .  ondansetron (ZOFRAN) 4 MG tablet, Take 1 tablet (4 mg total) by mouth every 8 (eight) hours as needed for nausea or vomiting. (Patient not taking: Reported on 06/21/2018), Disp: 20 tablet, Rfl: 0  Allergies Ciprofloxacin; Lyrica [pregabalin]; Feraheme [ferumoxytol]; Cymbalta [duloxetine hcl]; and Penicillins  Review of Systems Review of Systems - Oncology ROS negative   Physical Exam  Vitals Wt Readings from Last 3 Encounters:  06/21/18 140 lb (63.5 kg)  05/11/18 138 lb (62.6 kg)  04/23/18 138 lb (62.6 kg)   Temp Readings from Last 3 Encounters:  06/21/18 98.7 F (37.1 C) (Oral)  04/26/18 99.3 F (37.4 C) (Oral)  04/23/18 99 F (37.2 C)   BP Readings from Last 3 Encounters:  06/21/18 128/80  05/11/18 112/66  04/26/18 121/86   Pulse Readings from Last 3 Encounters:  06/21/18 85  05/11/18 82  04/26/18 95   Constitutional: Well-developed, well-nourished, and in no distress.   HENT: Head: Normocephalic and atraumatic.  Mouth/Throat: No oropharyngeal exudate. Mucosa moist. Eyes: Pupils are equal, round, and reactive to light. Conjunctivae are normal. No scleral icterus.  Neck: Normal range of motion. Neck supple. No JVD present.  Cardiovascular: Normal rate, regular rhythm and normal heart sounds.  Exam reveals no gallop and no friction rub.   No murmur heard. Pulmonary/Chest: Effort normal and breath sounds normal. No respiratory distress. No  wheezes.No rales.  Abdominal: Soft. Bowel sounds are normal. No distension. There is no tenderness. There is no guarding.  Musculoskeletal: No edema or tenderness.  Lymphadenopathy: No cervical, axillary or supraclavicular adenopathy.  Neurological: Alert and oriented to person, place, and time. No cranial nerve deficit.  Skin: Skin is warm and dry. No rash noted. No erythema. No pallor.  Psychiatric: Affect and judgment normal.   Labs No visits with results within 3 Day(s) from this visit.  Latest known visit with results is:  Appointment on 06/14/2018  Component Date Value Ref Range Status  . WBC 06/14/2018 7.5  4.0 - 10.5 K/uL Final  . RBC 06/14/2018 4.33  3.87 - 5.11 MIL/uL Final  . Hemoglobin 06/14/2018 14.2  12.0 - 15.0 g/dL Final  . HCT 06/14/2018 44.1  36.0 - 46.0 % Final  . MCV 06/14/2018 101.8* 80.0 - 100.0 fL Final  . MCH 06/14/2018 32.8  26.0 - 34.0 pg Final  . MCHC 06/14/2018 32.2  30.0 - 36.0 g/dL Final  . RDW 06/14/2018 12.5  11.5 - 15.5 % Final  . Platelets 06/14/2018 248  150 - 400 K/uL Final  . nRBC 06/14/2018 0.0  0.0 - 0.2 % Final  . Neutrophils Relative % 06/14/2018 70  % Final  . Neutro Abs 06/14/2018 5.3  1.7 - 7.7 K/uL Final  . Lymphocytes Relative 06/14/2018 23  % Final  . Lymphs Abs 06/14/2018 1.7  0.7 - 4.0 K/uL Final  . Monocytes Relative 06/14/2018 5  % Final  . Monocytes Absolute 06/14/2018 0.4  0.1 - 1.0 K/uL Final  . Eosinophils Relative 06/14/2018 1  % Final  . Eosinophils Absolute 06/14/2018 0.0  0.0 - 0.5 K/uL Final  . Basophils Relative 06/14/2018 1  % Final  . Basophils Absolute 06/14/2018 0.1  0.0 - 0.1 K/uL Final  . Immature Granulocytes 06/14/2018 0  % Final  . Abs Immature Granulocytes 06/14/2018 0.02  0.00 - 0.07 K/uL Final   Performed at [facility], [address] [place], [place] [address]  . Sodium 06/14/2018  141  135 - 145 mmol/L Final  . Potassium 06/14/2018 3.9  3.5 - 5.1 mmol/L Final  . Chloride 06/14/2018 106  98 - 111 mmol/L  Final  . CO2 06/14/2018 26  22 - 32 mmol/L Final  . Glucose, Bld 06/14/2018 90  70 - 99 mg/dL Final  . BUN 06/14/2018 15  6 - 20 mg/dL Final  . Creatinine, Ser 06/14/2018 0.77  0.44 - 1.00 mg/dL Final  . Calcium 06/14/2018 9.3  8.9 - 10.3 mg/dL Final  . Total Protein 06/14/2018 7.2  6.5 - 8.1 g/dL Final  . Albumin 06/14/2018 4.7  3.5 - 5.0 g/dL Final  . AST 06/14/2018 30  15 - 41 U/L Final  . ALT 06/14/2018 39  0 - 44 U/L Final  . Alkaline Phosphatase 06/14/2018 57  38 - 126 U/L Final  . Total Bilirubin 06/14/2018 0.3  0.3 - 1.2 mg/dL Final  . GFR calc non Af Amer 06/14/2018 >60  >60 mL/min Final  . GFR calc Af Amer 06/14/2018 >60  >60 mL/min Final  . Anion gap 06/14/2018 9  5 - 15 Final   Performed at [facility], [address] [place], [place] [address]  . LDH 06/14/2018 178  98 - 192 U/L Final   Performed at [facility], [address], [place], [place] [address]  . Ferritin 06/14/2018 100  11 - 307 ng/mL Final   Performed at [facility], [address] [place], [place] [address]     Pathology No orders of the defined types were placed in this encounter.      Zoila Shutter MD

## 2018-06-28 ENCOUNTER — Ambulatory Visit: Payer: PPO | Admitting: Podiatry

## 2018-06-28 ENCOUNTER — Encounter: Payer: Self-pay | Admitting: Podiatry

## 2018-06-28 VITALS — BP 123/74 | HR 85

## 2018-06-28 DIAGNOSIS — L600 Ingrowing nail: Secondary | ICD-10-CM | POA: Diagnosis not present

## 2018-06-28 DIAGNOSIS — G8929 Other chronic pain: Secondary | ICD-10-CM | POA: Diagnosis not present

## 2018-06-28 DIAGNOSIS — R102 Pelvic and perineal pain: Secondary | ICD-10-CM | POA: Diagnosis not present

## 2018-06-28 DIAGNOSIS — N393 Stress incontinence (female) (male): Secondary | ICD-10-CM | POA: Diagnosis not present

## 2018-06-28 DIAGNOSIS — M6289 Other specified disorders of muscle: Secondary | ICD-10-CM | POA: Diagnosis not present

## 2018-06-28 DIAGNOSIS — M545 Low back pain: Secondary | ICD-10-CM | POA: Diagnosis not present

## 2018-06-28 DIAGNOSIS — M62838 Other muscle spasm: Secondary | ICD-10-CM | POA: Diagnosis not present

## 2018-06-28 DIAGNOSIS — N941 Unspecified dyspareunia: Secondary | ICD-10-CM | POA: Diagnosis not present

## 2018-06-28 DIAGNOSIS — M533 Sacrococcygeal disorders, not elsewhere classified: Secondary | ICD-10-CM | POA: Diagnosis not present

## 2018-06-28 DIAGNOSIS — Z9882 Breast implant status: Secondary | ICD-10-CM | POA: Diagnosis not present

## 2018-06-28 MED ORDER — GENTAMICIN SULFATE 0.1 % EX CREA: 1.0000 "application " | TOPICAL_CREAM | Freq: Two times a day (BID) | CUTANEOUS | 0 refills | Status: DC

## 2018-06-28 NOTE — Patient Instructions (Signed)

## 2018-06-29 ENCOUNTER — Ambulatory Visit (INDEPENDENT_AMBULATORY_CARE_PROVIDER_SITE_OTHER): Payer: PPO | Admitting: Psychiatry

## 2018-06-29 ENCOUNTER — Encounter (HOSPITAL_COMMUNITY): Payer: Self-pay | Admitting: Psychiatry

## 2018-06-29 DIAGNOSIS — F331 Major depressive disorder, recurrent, moderate: Secondary | ICD-10-CM | POA: Diagnosis not present

## 2018-06-29 DIAGNOSIS — F411 Generalized anxiety disorder: Secondary | ICD-10-CM | POA: Diagnosis not present

## 2018-06-29 MED ORDER — ZIPRASIDONE HCL 60 MG PO CAPS: 60.0000 mg | ORAL_CAPSULE | Freq: Every day | ORAL | 2 refills | Status: DC

## 2018-06-29 MED ORDER — TEMAZEPAM 22.5 MG PO CAPS: 22.5000 mg | ORAL_CAPSULE | Freq: Every day | ORAL | 2 refills | Status: DC

## 2018-06-29 MED ORDER — BUPROPION HCL 75 MG PO TABS: ORAL_TABLET | ORAL | 2 refills | Status: DC

## 2018-06-29 MED ORDER — CLONAZEPAM 0.5 MG PO TABS: 0.5000 mg | ORAL_TABLET | Freq: Two times a day (BID) | ORAL | 2 refills | Status: DC | PRN
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
## 2018-06-30 NOTE — Progress Notes (Signed)
   Subjective: Patient presents today for evaluation of pain to the medial border of the right hallux that began three weeks ago. She reports associated purulent drainage. Patient is concerned for possible ingrown nail. Wearing shoes increases the pain. She has not done anything for treatment. Patient presents today for further treatment and evaluation.  Past Medical History:  Diagnosis Date  . Anorexia   . Anxiety   . Benign juvenile melanoma   . Chronic headaches   . Colon polyps    found on colonoscopy 04/26/2012  . Complication of anesthesia    itching after epidural for c section  . Constipation   . Depression   . Depression    several suicide attempts, hospitaluzed in 2012 for this; hx pf ECT treatments ; pt sees Dr. Adele Schilder pyschiatrist  and doing well on medication  . Headache   . Heart murmur    as a child - no one mentions hearing murmur anymore  . Heartburn    no meds  . History of pneumonia    x 3  years ago - no recent problems  . Obesity   . Pneumonia   . Polycystic ovary    takes metformin to treat  . Skin lesion    Excisional biopsy of moles - none cancerous  . Sleep apnea    yrs ago - diagnosed mild sleep apnea - did not have to use cpap     Objective:  General: Well developed, nourished, in no acute distress, alert and oriented x3   Dermatology: Skin is warm, dry and supple bilateral. Medial border of the right great toe appears to be erythematous with evidence of an ingrowing nail. Pain on palpation noted to the border of the nail fold. The remaining nails appear unremarkable at this time. There are no open sores, lesions.  Vascular: Dorsalis Pedis artery and Posterior Tibial artery pedal pulses palpable. No lower extremity edema noted.   Neruologic: Grossly intact via light touch bilateral.  Musculoskeletal: Muscular strength within normal limits in all groups bilateral. Normal range of motion noted to all pedal and ankle joints.   Assesement: #1  Paronychia with ingrowing nail medial border right hallux  #2 Pain in toe #3 Incurvated nail  Plan of Care:  1. Patient evaluated.  2. Discussed treatment alternatives and plan of care. Explained nail avulsion procedure and post procedure course to patient. 3. Patient opted for temporary partial nail avulsion of the medial border of the right great toe.  4. Prior to procedure, local anesthesia infiltration utilized using 3 ml of a 50:50 mixture of 2% plain lidocaine and 0.5% plain marcaine in a normal hallux block fashion and a betadine prep performed.  5. Light dressing applied. 6. Prescription for Gentamicin cream provided to patient to use daily with a bandage.  7. Return to clinic in 2 weeks.   Edrick Kins, DPM [facility]  Dr. Edrick Kins, [address]                                        [place], [place] [address]                Office [phone]  Fax [phone]

## 2018-07-01 DIAGNOSIS — F331 Major depressive disorder, recurrent, moderate: Secondary | ICD-10-CM | POA: Diagnosis not present

## 2018-07-01 DIAGNOSIS — F411 Generalized anxiety disorder: Secondary | ICD-10-CM | POA: Diagnosis not present

## 2018-07-01 DIAGNOSIS — F5001 Anorexia nervosa, restricting type: Secondary | ICD-10-CM | POA: Diagnosis not present

## 2018-07-07 DIAGNOSIS — F331 Major depressive disorder, recurrent, moderate: Secondary | ICD-10-CM | POA: Diagnosis not present

## 2018-07-07 DIAGNOSIS — F5001 Anorexia nervosa, restricting type: Secondary | ICD-10-CM | POA: Diagnosis not present

## 2018-07-07 DIAGNOSIS — F411 Generalized anxiety disorder: Secondary | ICD-10-CM | POA: Diagnosis not present

## 2018-07-12 ENCOUNTER — Ambulatory Visit (INDEPENDENT_AMBULATORY_CARE_PROVIDER_SITE_OTHER): Payer: PPO | Admitting: Podiatry

## 2018-07-12 ENCOUNTER — Other Ambulatory Visit: Payer: Self-pay

## 2018-07-12 DIAGNOSIS — L600 Ingrowing nail: Secondary | ICD-10-CM | POA: Diagnosis not present

## 2018-07-12 IMAGING — MR MR LUMBAR SPINE WO/W CM
4 of 7 series · 27 of 48 positions shown · IV contrast (multihance)
Comparison: None.

CLINICAL DATA: Pain from the waist down and burning in the buttocks
since February 11, 2017 after breast augmentation surgery.

EXAM:
MRI LUMBAR SPINE WITHOUT AND WITH CONTRAST
TECHNIQUE: Multiplanar and multiecho pulse sequences of the lumbar spine were
obtained without and with intravenous contrast.
CONTRAST:  10 ml MULTIHANCE GADOBENATE DIMEGLUMINE 529 MG/ML IV SOLN

[Series 4: T1 · sagittal · 4.0mm · 0.59mm/px · 4 of 13 slices shown (1 of 2)]
[im 1/13]
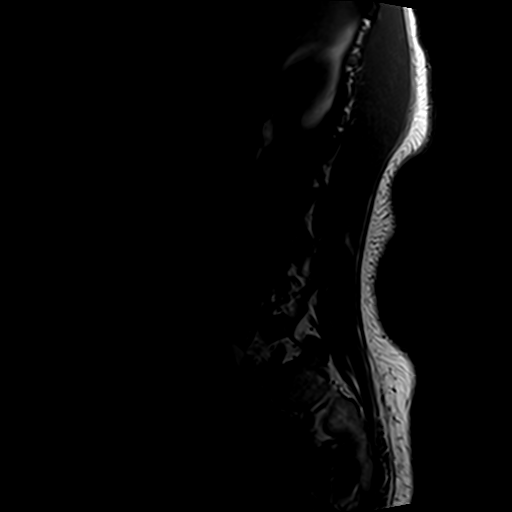
[im 5/13]
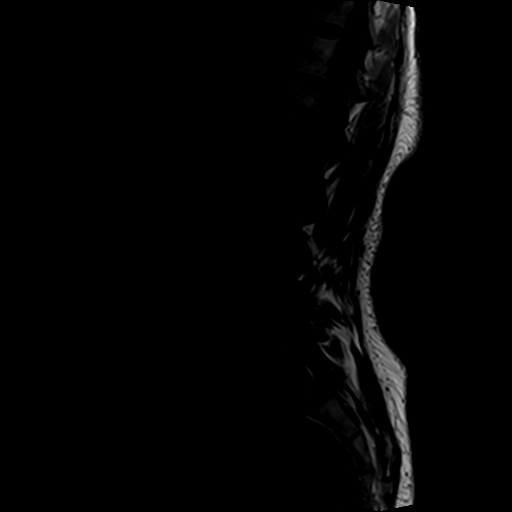
[im 9/13]
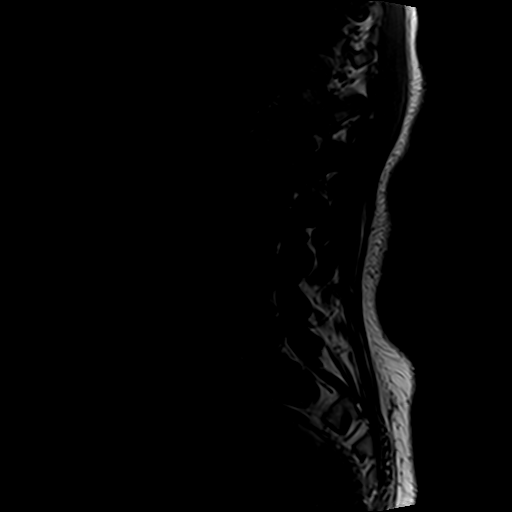
[im 13/13]
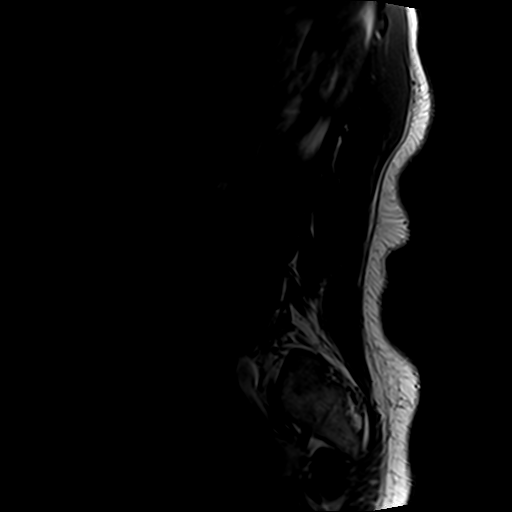

[Series 5: T2 post-contrast · sagittal · 4.0mm · 0.59mm/px · 4 of 13 slices shown]
[im 1/13]
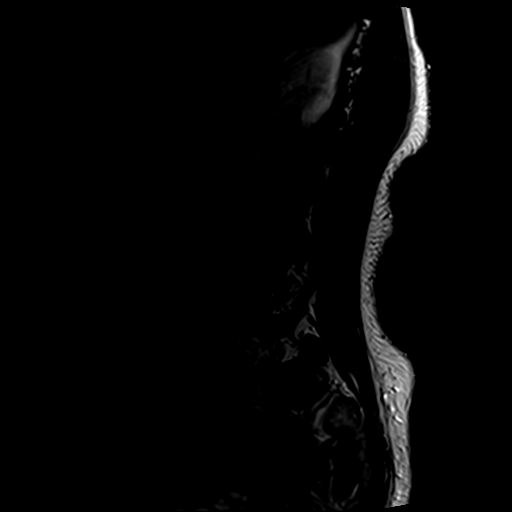
[im 5/13]
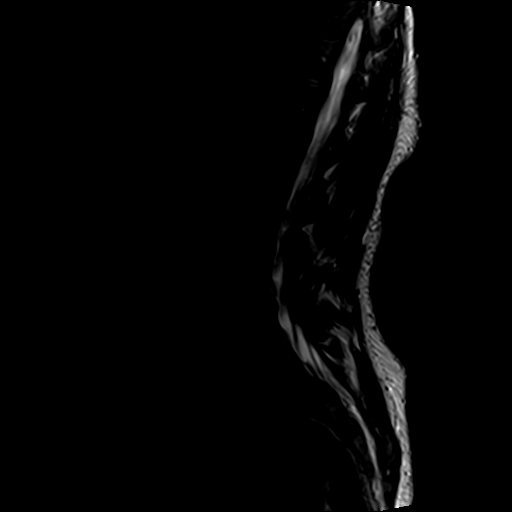
[im 9/13]
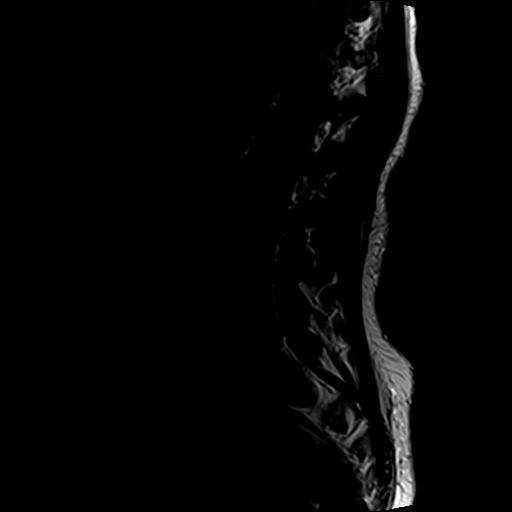
[im 13/13]
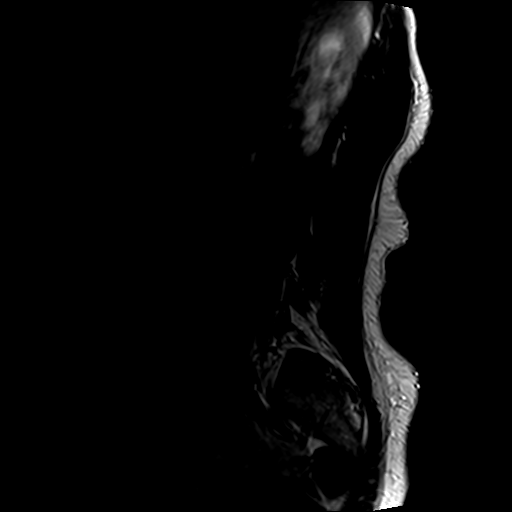

[Series 6: T2 · axial · 4.0mm · 0.70mm/px · z∈[-88,+138]mm · 11 of 40 slices shown]
[im 1/40]
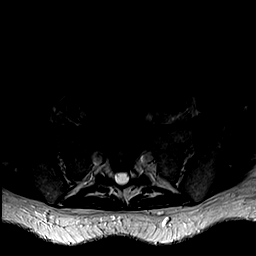
[im 4/40]
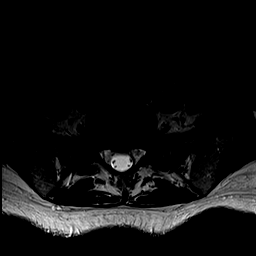
[im 8/40]
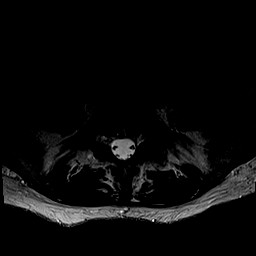
[im 12/40]
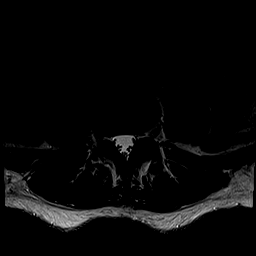
[im 16/40]
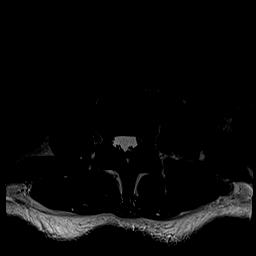
[im 20/40]
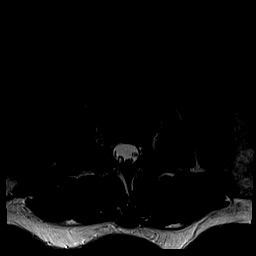
[im 24/40]
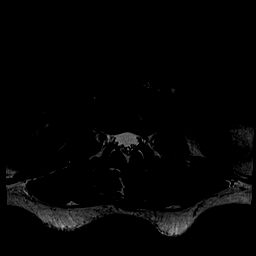
[im 28/40]
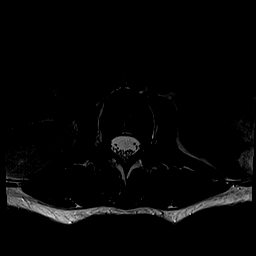
[im 32/40]
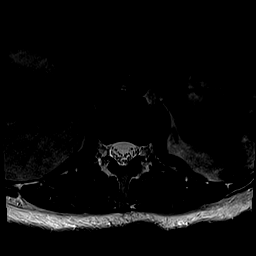
[im 36/40]
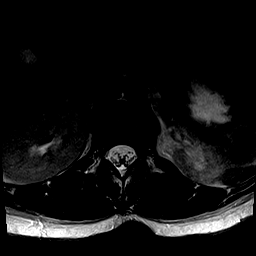
[im 40/40]
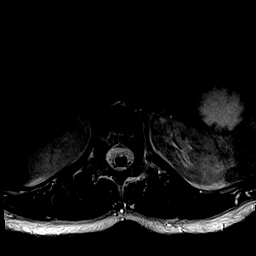

[Series 7: T1 · axial · 4.0mm · 0.35mm/px · z∈[-88,+118]mm · 8 of 40 slices shown (2 of 2)]
[im 1/40]
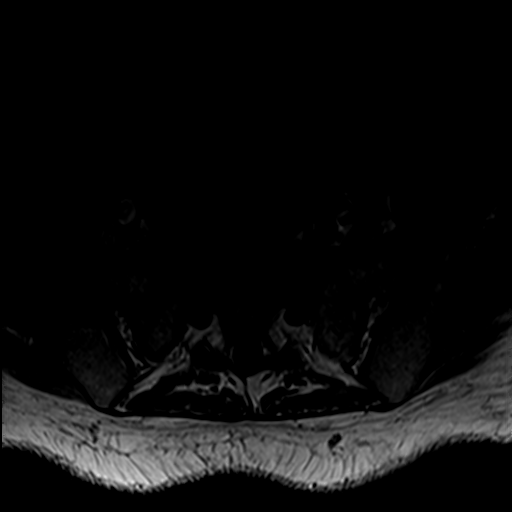
[im 4/40]
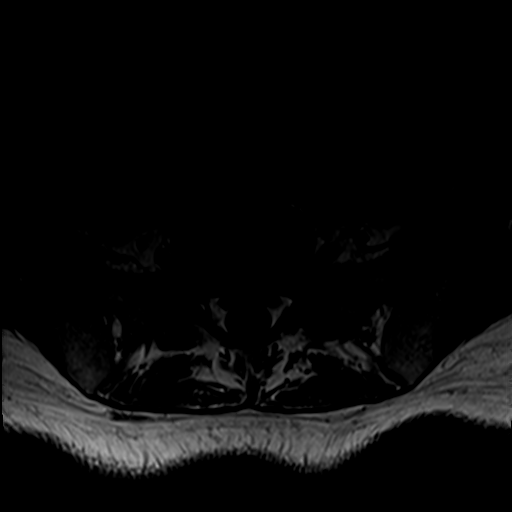
[im 8/40]
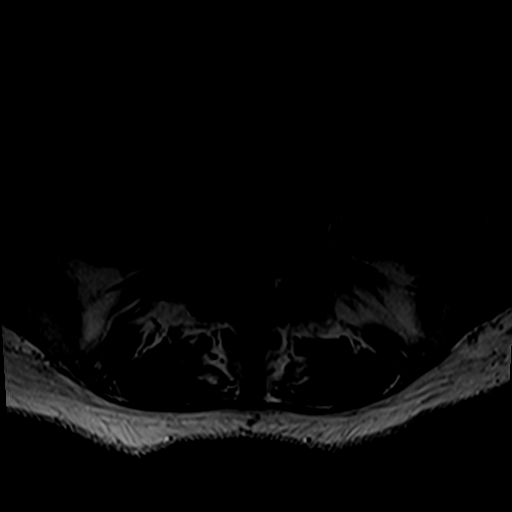
[im 12/40]
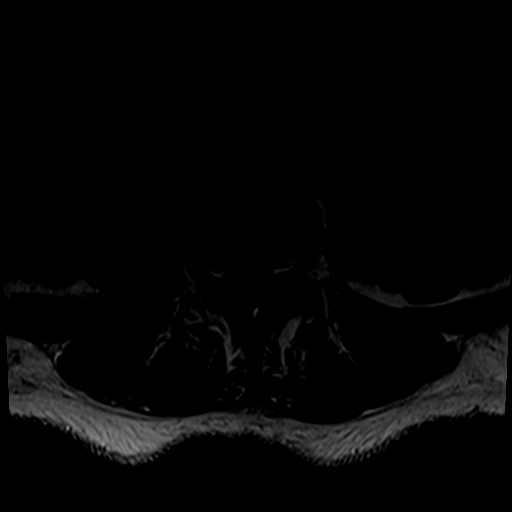
[im 16/40]
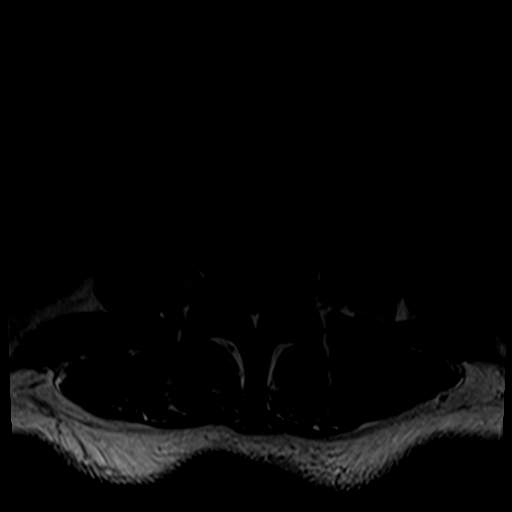
[im 20/40]
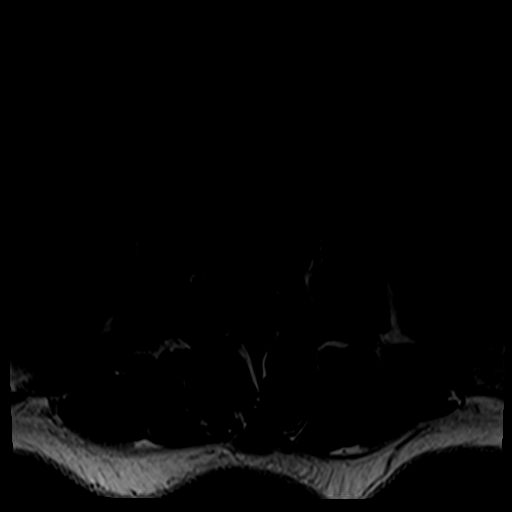
[im 24/40]
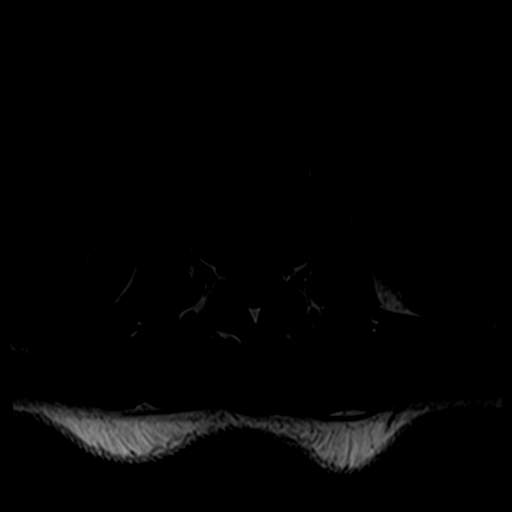
[im 36/40]
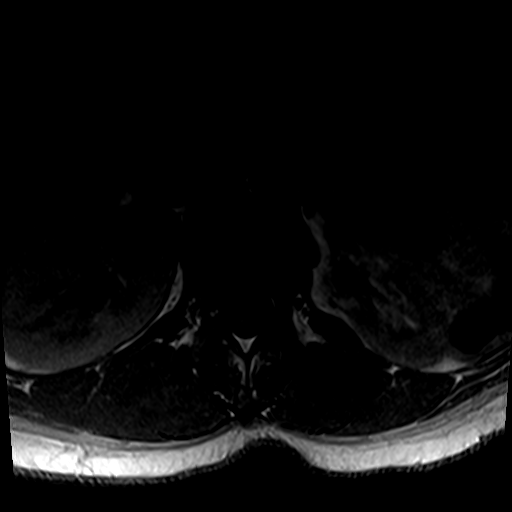

[27 of 48 positions shown; findings below may reference images not displayed]

FINDINGS: Segmentation:  Standard.

Alignment:  Maintained.

Vertebrae:  Height and signal are normal.

Conus medullaris and cauda equina: Conus extends to the L1 level.
Conus and cauda equina appear normal.

Paraspinal and other soft tissues: Negative.

Disc levels:

T11-12 to L3-4 are negative.

L4-5: The patient has a small right foraminal protrusion causing
mild foraminal narrowing. The central canal and left foramen are
open.

L5-S1: Tiny central annular fissure and disc desiccation without
bulge or protrusion. The central canal and foramina are widely
patent.
IMPRESSION: No finding to explain the patient's symptoms. Small right foraminal
protrusion at L4-5 causes mild foraminal narrowing. The foramina are
otherwise widely patent. The central canal is open at all levels.

## 2018-07-13 ENCOUNTER — Encounter: Payer: Self-pay | Admitting: Family Medicine

## 2018-07-14 NOTE — Progress Notes (Signed)
   Subjective: Patient presents today status post ingrown nail temporary nail avulsion procedure of the medial border of the right great toenail that was performed on 06/28/2018. She reports some tenderness of the area. She denies any drainage. She has been using the Gentamicin cream as directed. Patient is here for further evaluation and treatment.  Past Medical History:  Diagnosis Date  . Anorexia   . Anxiety   . Benign juvenile melanoma   . Chronic headaches   . Colon polyps    found on colonoscopy 04/26/2012  . Complication of anesthesia    itching after epidural for c section  . Constipation   . Depression   . Depression    several suicide attempts, hospitaluzed in 2012 for this; hx pf ECT treatments ; pt sees Dr. Adele Schilder pyschiatrist  and doing well on medication  . Headache   . Heart murmur    as a child - no one mentions hearing murmur anymore  . Heartburn    no meds  . History of pneumonia    x 3  years ago - no recent problems  . Obesity   . Pneumonia   . Polycystic ovary    takes metformin to treat  . Skin lesion    Excisional biopsy of moles - none cancerous  . Sleep apnea    yrs ago - diagnosed mild sleep apnea - did not have to use cpap     Objective: Skin is warm, dry and supple. Nail and respective nail fold appears to be healing appropriately. Open wound to the associated nail fold with a granular wound base and moderate amount of fibrotic tissue. Minimal drainage noted.   Assessment: #1 postop temporary partial nail avulsion medial border right hallux  #2 open wound periungual nail fold of respective digit.   Plan of care: #1 patient was evaluated  #2 debridement of open wound was performed to the periungual border of the respective toe using a currette. Antibiotic ointment and Band-Aid was applied. #3 Recommended using Neosporin daily for one week.  #4 patient is to return to clinic on a PRN basis.   Edrick Kins, DPM [facility]   Dr. Edrick Kins, [address]                                        [place], [place] [address]                Office [phone]  Fax [phone]

## 2018-07-17 ENCOUNTER — Other Ambulatory Visit (HOSPITAL_COMMUNITY): Payer: Self-pay | Admitting: Psychiatry

## 2018-07-19 ENCOUNTER — Encounter: Payer: Self-pay | Admitting: Family Medicine

## 2018-07-20 ENCOUNTER — Other Ambulatory Visit: Payer: Self-pay | Admitting: Family Medicine

## 2018-07-20 ENCOUNTER — Inpatient Hospital Stay (HOSPITAL_COMMUNITY): Payer: PPO | Attending: Hematology

## 2018-07-20 DIAGNOSIS — Z9109 Other allergy status, other than to drugs and biological substances: Secondary | ICD-10-CM

## 2018-07-22 ENCOUNTER — Telehealth: Payer: Self-pay | Admitting: *Deleted

## 2018-07-22 ENCOUNTER — Telehealth: Payer: Self-pay

## 2018-07-22 DIAGNOSIS — F411 Generalized anxiety disorder: Secondary | ICD-10-CM | POA: Diagnosis not present

## 2018-07-22 DIAGNOSIS — F5001 Anorexia nervosa, restricting type: Secondary | ICD-10-CM | POA: Diagnosis not present

## 2018-07-22 DIAGNOSIS — Z9109 Other allergy status, other than to drugs and biological substances: Secondary | ICD-10-CM

## 2018-07-22 DIAGNOSIS — F331 Major depressive disorder, recurrent, moderate: Secondary | ICD-10-CM | POA: Diagnosis not present

## 2018-07-22 NOTE — Telephone Encounter (Signed)
Referral to pulmonology re-entered because first provider is unable to see patient til possibly July. Is only doing telephone visits but can't do one for this patient because she isn't an established patient yet.

## 2018-07-22 NOTE — Telephone Encounter (Signed)
Called pt to ask about referral since it would be until July with the [facility]. She stated she didn't want to go anywhere right now with her lungs being the way that they are and if they would not do tele visit to establish new pt care at places she didn't know what to do because she didn't feel comfortable going out into the public with her lungs they way that they are. She didn't want to chance it.

## 2018-07-22 NOTE — Telephone Encounter (Signed)
FYI

## 2018-07-26 NOTE — Telephone Encounter (Signed)
Noted, sh will seek Urgent as in ED care if she develops further problems

## 2018-08-09 DIAGNOSIS — F5001 Anorexia nervosa, restricting type: Secondary | ICD-10-CM | POA: Diagnosis not present

## 2018-08-09 DIAGNOSIS — F411 Generalized anxiety disorder: Secondary | ICD-10-CM | POA: Diagnosis not present

## 2018-08-09 DIAGNOSIS — F331 Major depressive disorder, recurrent, moderate: Secondary | ICD-10-CM | POA: Diagnosis not present

## 2018-08-16 DIAGNOSIS — F5001 Anorexia nervosa, restricting type: Secondary | ICD-10-CM | POA: Diagnosis not present

## 2018-08-16 DIAGNOSIS — F411 Generalized anxiety disorder: Secondary | ICD-10-CM | POA: Diagnosis not present

## 2018-08-16 DIAGNOSIS — F331 Major depressive disorder, recurrent, moderate: Secondary | ICD-10-CM | POA: Diagnosis not present

## 2018-08-18 NOTE — Telephone Encounter (Signed)
Patient returning call.

## 2018-08-18 NOTE — Telephone Encounter (Signed)
I did not call this patient. Zacarias Pontes called this patient regarding her upcoming appt. Transferred patient to [facility] to discuss appt.

## 2018-08-19 DIAGNOSIS — G894 Chronic pain syndrome: Secondary | ICD-10-CM | POA: Diagnosis not present

## 2018-08-19 DIAGNOSIS — M47816 Spondylosis without myelopathy or radiculopathy, lumbar region: Secondary | ICD-10-CM | POA: Diagnosis not present

## 2018-08-19 DIAGNOSIS — M533 Sacrococcygeal disorders, not elsewhere classified: Secondary | ICD-10-CM | POA: Diagnosis not present

## 2018-08-20 ENCOUNTER — Ambulatory Visit (HOSPITAL_COMMUNITY): Payer: Self-pay

## 2018-08-23 DIAGNOSIS — F331 Major depressive disorder, recurrent, moderate: Secondary | ICD-10-CM | POA: Diagnosis not present

## 2018-08-23 DIAGNOSIS — F411 Generalized anxiety disorder: Secondary | ICD-10-CM | POA: Diagnosis not present

## 2018-08-23 DIAGNOSIS — F5001 Anorexia nervosa, restricting type: Secondary | ICD-10-CM | POA: Diagnosis not present

## 2018-08-24 ENCOUNTER — Ambulatory Visit (INDEPENDENT_AMBULATORY_CARE_PROVIDER_SITE_OTHER): Payer: PPO | Admitting: Family Medicine

## 2018-08-24 ENCOUNTER — Other Ambulatory Visit: Payer: Self-pay

## 2018-08-24 ENCOUNTER — Encounter: Payer: Self-pay | Admitting: Family Medicine

## 2018-08-24 VITALS — BP 120/70 | Ht 65.5 in | Wt 140.0 lb

## 2018-08-24 DIAGNOSIS — M533 Sacrococcygeal disorders, not elsewhere classified: Secondary | ICD-10-CM | POA: Diagnosis not present

## 2018-08-24 DIAGNOSIS — F333 Major depressive disorder, recurrent, severe with psychotic symptoms: Secondary | ICD-10-CM

## 2018-08-24 DIAGNOSIS — Z9109 Other allergy status, other than to drugs and biological substances: Secondary | ICD-10-CM

## 2018-08-24 DIAGNOSIS — B373 Candidiasis of vulva and vagina: Secondary | ICD-10-CM

## 2018-08-24 DIAGNOSIS — F509 Eating disorder, unspecified: Secondary | ICD-10-CM

## 2018-08-24 DIAGNOSIS — B3731 Acute candidiasis of vulva and vagina: Secondary | ICD-10-CM

## 2018-08-24 NOTE — Progress Notes (Addendum)
Virtual Visit via Telephone Note  I connected with Katelyn [name] on 08/24/18 at  9:20 AM EDT by telephone and verified that I am speaking with the correct person using two identifiers.   I discussed the limitations, risks, security and privacy concerns of performing an evaluation and management service by telephone and the availability of in person appointments. I also discussed with the patient that there may be a patient responsible charge related to this service. The patient expressed understanding and agreed to proceed. Pt is in her home and I am in the office . Webex attempt is unsuccessful   History of Present Illness: 1 week bad vaginal yeast  Infection with excessive itching, no significant discharge , no fever, chills or urinary symptoms . Has had this problem in the past, responds to both topical and oral simultaneously, through Gyne in the past  C/o burning in sensation in the lungs which statrted in summer 2019 after exposure to bug spray. Got slightly better, then after exposure to weed and feed, at the  end of the Fall symptoms worsened, reports burning in her lungs whenever she is actually able to smell, denies any wheezing , feels as if she is not getting enough air inside her,  though, no cough  C/o sinus drainge  Denies recent fever or chills. Denies sinus pressure, nasal congestion, ear pain or sore throat.  Denies chest pains, palpitations and leg swelling Denies abdominal pain, nausea, vomiting,diarrhea or constipation.   Denies dysuria, frequency, hesitancy or incontinence. C/o recurrent vaginal itch Chronic  pain,  and limitation in mobility. Denies headaches, seizures, chronic  numbness, and  tingling. Chronic  depression, anxiety and  Insomnia.treated by Psychiatry, currently " stable" Denies skin break down or rash.     Observations/Objective: Ht 5' 5.5" (1.664 m)   Wt 140 lb (63.5 kg)   SpO2 100%   BMI 22.94 kg/m  Good communication with no confusion  and intact memory. Alert and oriented x 3 No signs of respiratory distress during sppech    Assessment and Plan: Multiple environmental allergies Reports that since mid 2019, following exposure to bug spray, and then weed  Killer, she has experienced burning in her lungs with decreased sense  of taste and smell, and at times feels as though not getting sufficient air. Has concerns as to whether her breast implants are affecting her immune system also, and has been considering removal I recommend discuss further with surgeon who implanted them and be very slow to move forward with surgery unless  clearly indicated. Referral to Allergist for further evaluation  Coccydynia Managed by pain management on hydrocodone , PDMP reviewed and she is compliant  Candidal vulvovaginitis Reports recurrent episodes, fluconazole prescribed, she will need to update me on topical cram she mentioned  Depression, major, recurrent, severe with psychosis Continues to be closely followed by Psychiatry  Eating disorder Needs support from nutritionist lifelong    Follow Up Instructions:    I discussed the assessment and treatment plan with the patient. The patient was provided an opportunity to ask questions and all were answered. The patient agreed with the plan and demonstrated an understanding of the instructions.   The patient was advised to call back or seek an in-person evaluation if the symptoms worsen or if the condition fails to improve as anticipated.  I provided 25 minutes of non-face-to-face time during this encounter.   [name], MD

## 2018-08-25 ENCOUNTER — Ambulatory Visit: Payer: Self-pay | Admitting: Family Medicine

## 2018-08-25 ENCOUNTER — Encounter: Payer: Self-pay | Admitting: Family Medicine

## 2018-08-25 MED ORDER — FLUCONAZOLE 150 MG PO TABS: ORAL_TABLET | ORAL | 1 refills | Status: DC

## 2018-08-29 ENCOUNTER — Encounter: Payer: Self-pay | Admitting: Family Medicine

## 2018-08-29 DIAGNOSIS — Z9109 Other allergy status, other than to drugs and biological substances: Secondary | ICD-10-CM | POA: Insufficient documentation

## 2018-08-29 NOTE — Assessment & Plan Note (Addendum)
Managed by pain management on hydrocodone , PDMP reviewed and she is compliant

## 2018-08-29 NOTE — Assessment & Plan Note (Signed)
Reports recurrent episodes, fluconazole prescribed, she will need to update me on topical cram she mentioned

## 2018-08-29 NOTE — Assessment & Plan Note (Signed)
Needs support from nutritionist lifelong

## 2018-08-29 NOTE — Assessment & Plan Note (Signed)
Reports that since mid 2019, following exposure to bug spray, and then weed  Killer, she has experienced burning in her lungs with decreased sense  of taste and smell, and at times feels as though not getting sufficient air. Has concerns as to whether her breast implants are affecting her immune system also, and has been considering removal I recommend discuss further with surgeon who implanted them and be very slow to move forward with surgery unless  clearly indicated. Referral to Allergist for further evaluation

## 2018-08-29 NOTE — Patient Instructions (Signed)
Wellness due end June , please schedule  Annual exam  with MD , no pap in September  You ar referred to Allergist for further evaluation, as we discussed  Medication is sent for your recurrent symptoms  Thanks for choosing [facility], we consider it a privelige to serve you.   Social distancing. Frequent hand washing with soap and water Keeping your hands off of your face.Wear a face mask in public and keep a 6 ft distance from people outside of your close contacts These 3 practices will help to keep both you and your community healthy during this time. Please practice them faithfully!

## 2018-08-29 NOTE — Assessment & Plan Note (Signed)
Continues to be closely followed by Psychiatry

## 2018-08-30 DIAGNOSIS — F331 Major depressive disorder, recurrent, moderate: Secondary | ICD-10-CM | POA: Diagnosis not present

## 2018-08-30 DIAGNOSIS — F5001 Anorexia nervosa, restricting type: Secondary | ICD-10-CM | POA: Diagnosis not present

## 2018-08-30 DIAGNOSIS — F411 Generalized anxiety disorder: Secondary | ICD-10-CM | POA: Diagnosis not present

## 2018-09-06 DIAGNOSIS — F5001 Anorexia nervosa, restricting type: Secondary | ICD-10-CM | POA: Diagnosis not present

## 2018-09-06 DIAGNOSIS — F411 Generalized anxiety disorder: Secondary | ICD-10-CM | POA: Diagnosis not present

## 2018-09-06 DIAGNOSIS — F331 Major depressive disorder, recurrent, moderate: Secondary | ICD-10-CM | POA: Diagnosis not present

## 2018-09-09 DIAGNOSIS — G608 Other hereditary and idiopathic neuropathies: Secondary | ICD-10-CM | POA: Diagnosis not present

## 2018-09-13 DIAGNOSIS — F331 Major depressive disorder, recurrent, moderate: Secondary | ICD-10-CM | POA: Diagnosis not present

## 2018-09-13 DIAGNOSIS — F5001 Anorexia nervosa, restricting type: Secondary | ICD-10-CM | POA: Diagnosis not present

## 2018-09-13 DIAGNOSIS — F411 Generalized anxiety disorder: Secondary | ICD-10-CM | POA: Diagnosis not present

## 2018-09-14 ENCOUNTER — Encounter: Payer: Self-pay | Admitting: Family Medicine

## 2018-09-14 ENCOUNTER — Other Ambulatory Visit (HOSPITAL_COMMUNITY): Payer: Self-pay | Admitting: Psychiatry

## 2018-09-21 ENCOUNTER — Ambulatory Visit (HOSPITAL_COMMUNITY): Payer: Self-pay

## 2018-09-22 ENCOUNTER — Ambulatory Visit (HOSPITAL_COMMUNITY): Payer: Self-pay | Admitting: Nurse Practitioner

## 2018-09-27 DIAGNOSIS — F331 Major depressive disorder, recurrent, moderate: Secondary | ICD-10-CM | POA: Diagnosis not present

## 2018-09-27 DIAGNOSIS — F411 Generalized anxiety disorder: Secondary | ICD-10-CM | POA: Diagnosis not present

## 2018-09-27 DIAGNOSIS — F5001 Anorexia nervosa, restricting type: Secondary | ICD-10-CM | POA: Diagnosis not present

## 2018-09-28 ENCOUNTER — Telehealth (HOSPITAL_COMMUNITY): Payer: Self-pay | Admitting: Psychiatry

## 2018-09-29 ENCOUNTER — Other Ambulatory Visit: Payer: Self-pay

## 2018-09-29 ENCOUNTER — Ambulatory Visit (INDEPENDENT_AMBULATORY_CARE_PROVIDER_SITE_OTHER): Payer: PPO | Admitting: Psychiatry

## 2018-09-29 ENCOUNTER — Encounter (HOSPITAL_COMMUNITY): Payer: Self-pay | Admitting: Psychiatry

## 2018-09-29 VITALS — Wt 142.0 lb

## 2018-09-29 DIAGNOSIS — F509 Eating disorder, unspecified: Secondary | ICD-10-CM | POA: Diagnosis not present

## 2018-09-29 DIAGNOSIS — F411 Generalized anxiety disorder: Secondary | ICD-10-CM | POA: Diagnosis not present

## 2018-09-29 DIAGNOSIS — F331 Major depressive disorder, recurrent, moderate: Secondary | ICD-10-CM | POA: Diagnosis not present

## 2018-09-29 MED ORDER — TEMAZEPAM 22.5 MG PO CAPS: 22.5000 mg | ORAL_CAPSULE | Freq: Every day | ORAL | 2 refills | Status: DC

## 2018-09-29 MED ORDER — BUPROPION HCL 75 MG PO TABS: ORAL_TABLET | ORAL | 2 refills | Status: DC

## 2018-09-29 MED ORDER — CLONAZEPAM 0.5 MG PO TABS: 0.5000 mg | ORAL_TABLET | Freq: Two times a day (BID) | ORAL | 2 refills | Status: DC | PRN

## 2018-09-29 MED ORDER — ZIPRASIDONE HCL 60 MG PO CAPS: 60.0000 mg | ORAL_CAPSULE | Freq: Every day | ORAL | 2 refills | Status: DC

## 2018-09-29 NOTE — Progress Notes (Signed)
Virtual Visit via Telephone Note  I connected with Katelyn [name] on 09/29/18 at  8:40 AM EDT by telephone and verified that I am speaking with the correct person using two identifiers.   I discussed the limitations, risks, security and privacy concerns of performing an evaluation and management service by telephone and the availability of in person appointments. I also discussed with the patient that there may be a patient responsible charge related to this service. The patient expressed understanding and agreed to proceed.   History of Present Illness: Katelyn [name] was evaluated by phone session.  Now she is taking clonazepam but does not take every day twice a day.  She is feeling better as her anxiety is less but she is very worried about upcoming appointment at [facility] which requires blood work.  Patient told she has no choice and she is very concerned leaving the house.  Patient told her body is so sensitive that recently she was working at backyard and she had reaction to chemicals when her husband is spraying the chemicals to kill the wheets.  She started to have cough and her PCP diagnosed her chemical pneumonia.  She still have some breathing but slowly and gradually improving.  She is no longer taking Cymbalta.  She noticed since taking the clonazepam for anxiety and sleep is improved.  She does not have any major panic attack.  She is getting therapy for her CBT and also consulting with nutritionist for her eating disorder.  She like to have increase weight and she gained 2 pounds since the last visit.  Patient told she has to gain weight so she has more fatty tissue to resist infections.  She still have chronic pain.  She reported her energy level is fair.  She denies any crying spells or any feeling of hopelessness or worthlessness.  She denies any paranoia or delusions.  She reported no tremors shakes or any EPS.  She denies drinking or using any illegal substances.  Her husband is very  supportive.   Past Psychiatric History: Reviewed. H/O multiple hospitalization. Last admission February 2015. Good response with ECT but scared to continue maintenance ECT. Tried Cymbalta, Lexapro, Abilify, lithium, Wellbutrin, Lamictal, Ritalin, Remeron, Risperdal, Neurontin, Vistaril, BuSpar, Prozac and Valium.    Psychiatric Specialty Exam: Physical Exam  ROS  There were no vitals taken for this visit.There is no height or weight on file to calculate BMI.  General Appearance: NA  Eye Contact:  NA  Speech:  Clear and Coherent  Volume:  Normal  Mood:  Anxious  Affect:  NA  Thought Process:  Goal Directed  Orientation:  Full (Time, Place, and Person)  Thought Content:  Rumination  Suicidal Thoughts:  No  Homicidal Thoughts:  No  Memory:  Immediate;   Good Recent;   Good Remote;   Good  Judgement:  Good  Insight:  Good  Psychomotor Activity:  NA  Concentration:  Concentration: Good and Attention Span: Good  Recall:  Good  Fund of Knowledge:  Good  Language:  Good  Akathisia:  NA  Handed:  Right  AIMS (if indicated):     Assets:  Communication Skills Desire for Improvement Housing Resilience Social Support  ADL's:  Intact  Cognition:  WNL  Sleep:   fair      Assessment and Plan: Generalized anxiety disorder.  Major depressive disorder, recurrent.  Eating disorder NOS.  Patient started to feeling somewhat better since taking the clonazepam but she does not take twice a  day.  I encouraged that she should take clonazepam twice a day since it is helping.  I also recommend that she should take 1 tablet before her upcoming appointment at [facility].  Patient is tolerating her medication and does not ask early refills for her benzodiazepine.  I encouraged to continue therapy for CBT and nutritionist for her eating disorder.  I will continue Wellbutrin 75 mg 2 tablet in the morning, Geodon 60 mg daily, temazepam 22.5 mg at bedtime and Klonopin 0.5 mg twice a day.  She is also  taking baclofen, Topamax and gabapentin from other providers.  Discussed medication side effects and benefits.  Recommended to call us back if she has any question or any concern.  Reassurance given about pandemic and recommend that she should keep social distancing and also use mask when she is working in backyard.  Follow-up in 3 months.    Follow Up Instructions:    I discussed the assessment and treatment plan with the patient. The patient was provided an opportunity to ask questions and all were answered. The patient agreed with the plan and demonstrated an understanding of the instructions.   The patient was advised to call back or seek an in-person evaluation if the symptoms worsen or if the condition fails to improve as anticipated.  I provided 15 minutes of non-face-to-face time during this encounter.   Kathlee Nations, MD

## 2018-10-07 DIAGNOSIS — F331 Major depressive disorder, recurrent, moderate: Secondary | ICD-10-CM | POA: Diagnosis not present

## 2018-10-07 DIAGNOSIS — F411 Generalized anxiety disorder: Secondary | ICD-10-CM | POA: Diagnosis not present

## 2018-10-07 DIAGNOSIS — F5001 Anorexia nervosa, restricting type: Secondary | ICD-10-CM | POA: Diagnosis not present

## 2018-10-18 ENCOUNTER — Other Ambulatory Visit (HOSPITAL_COMMUNITY): Payer: Self-pay

## 2018-10-19 ENCOUNTER — Other Ambulatory Visit: Payer: Self-pay

## 2018-10-19 ENCOUNTER — Ambulatory Visit (INDEPENDENT_AMBULATORY_CARE_PROVIDER_SITE_OTHER): Payer: PPO | Admitting: Family Medicine

## 2018-10-19 ENCOUNTER — Encounter: Payer: Self-pay | Admitting: Family Medicine

## 2018-10-19 VITALS — BP 120/70 | HR 86 | Resp 16 | Ht 65.5 in | Wt 140.0 lb

## 2018-10-19 DIAGNOSIS — Z0000 Encounter for general adult medical examination without abnormal findings: Secondary | ICD-10-CM

## 2018-10-19 DIAGNOSIS — G43109 Migraine with aura, not intractable, without status migrainosus: Secondary | ICD-10-CM | POA: Diagnosis not present

## 2018-10-19 MED ORDER — ONDANSETRON HCL 4 MG PO TABS: 4.0000 mg | ORAL_TABLET | Freq: Three times a day (TID) | ORAL | 0 refills | Status: DC | PRN

## 2018-10-19 MED ORDER — TOPIRAMATE 50 MG PO TABS: 50.0000 mg | ORAL_TABLET | Freq: Every day | ORAL | 1 refills | Status: DC
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
## 2018-10-19 NOTE — Patient Instructions (Signed)
[name] , Thank you for taking time to come for your Medicare Wellness Visit. I appreciate your ongoing commitment to your health goals. Please review the following plan we discussed and let me know if I can assist you in the future.   Screening recommendations/referrals: Colonoscopy: Please schedule this once you can Mammogram: Up to date Bone Density: Schedule when you can Recommended yearly ophthalmology/optometry visit for glaucoma screening and checkup Recommended yearly dental visit for hygiene and checkup  Vaccinations: Influenza vaccine: Due Fall 2020 Pneumococcal vaccine: Decline for now Tdap vaccine: Up to date Shingles vaccine: N/a  Conditions/risks identified: Falls  Next appointment: 01/11/2019   Preventive Care 40-64 Years, Female Preventive care refers to lifestyle choices and visits with your health care provider that can promote health and wellness. What does preventive care include?  A yearly physical exam. This is also called an annual well check.  Dental exams once or twice a year.  Routine eye exams. Ask your health care provider how often you should have your eyes checked.  Personal lifestyle choices, including:  Daily care of your teeth and gums.  Regular physical activity.  Eating a healthy diet.  Avoiding tobacco and drug use.  Limiting alcohol use.  Practicing safe sex.  Taking low-dose aspirin daily starting at age 9.  Taking vitamin and mineral supplements as recommended by your health care provider. What happens during an annual well check? The services and screenings done by your health care provider during your annual well check will depend on your age, overall health, lifestyle risk factors, and family history of disease. Counseling  Your health care provider may ask you questions about your:  Alcohol use.  Tobacco use.  Drug use.  Emotional well-being.  Home and relationship well-being.  Sexual activity.  Eating  habits.  Work and work Statistician.  Method of birth control.  Menstrual cycle.  Pregnancy history. Screening  You may have the following tests or measurements:  Height, weight, and BMI.  Blood pressure.  Lipid and cholesterol levels. These may be checked every 5 years, or more frequently if you are over 71 years old.  Skin check.  Lung cancer screening. You may have this screening every year starting at age 40 if you have a 30-pack-year history of smoking and currently smoke or have quit within the past 15 years.  Fecal occult blood test (FOBT) of the stool. You may have this test every year starting at age 52.  Flexible sigmoidoscopy or colonoscopy. You may have a sigmoidoscopy every 5 years or a colonoscopy every 10 years starting at age 45.  Hepatitis C blood test.  Hepatitis B blood test.  Sexually transmitted disease (STD) testing.  Diabetes screening. This is done by checking your blood sugar (glucose) after you have not eaten for a while (fasting). You may have this done every 1-3 years.  Mammogram. This may be done every 1-2 years. Talk to your health care provider about when you should start having regular mammograms. This may depend on whether you have a family history of breast cancer.  BRCA-related cancer screening. This may be done if you have a family history of breast, ovarian, tubal, or peritoneal cancers.  Pelvic exam and Pap test. This may be done every 3 years starting at age 32. Starting at age 37, this may be done every 5 years if you have a Pap test in combination with an HPV test.  Bone density scan. This is done to screen for osteoporosis. You may  have this scan if you are at high risk for osteoporosis. Discuss your test results, treatment options, and if necessary, the need for more tests with your health care provider. Vaccines  Your health care provider may recommend certain vaccines, such as:  Influenza vaccine. This is recommended every year.   Tetanus, diphtheria, and acellular pertussis (Tdap, Td) vaccine. You may need a Td booster every 10 years.  Zoster vaccine. You may need this after age 64.  Pneumococcal 13-valent conjugate (PCV13) vaccine. You may need this if you have certain conditions and were not previously vaccinated.  Pneumococcal polysaccharide (PPSV23) vaccine. You may need one or two doses if you smoke cigarettes or if you have certain conditions. Talk to your health care provider about which screenings and vaccines you need and how often you need them. This information is not intended to replace advice given to you by your health care provider. Make sure you discuss any questions you have with your health care provider. Document Released: 05/11/2015 Document Revised: 01/02/2016 Document Reviewed: 02/13/2015 Elsevier Interactive Patient Education  2017 [place] Prevention in the Home Falls can cause injuries. They can happen to people of all ages. There are many things you can do to make your home safe and to help prevent falls. What can I do on the outside of my home?  Regularly fix the edges of walkways and driveways and fix any cracks.  Remove anything that might make you trip as you walk through a door, such as a raised step or threshold.  Trim any bushes or trees on the path to your home.  Use bright outdoor lighting.  Clear any walking paths of anything that might make someone trip, such as rocks or tools.  Regularly check to see if handrails are loose or broken. Make sure that both sides of any steps have handrails.  Any raised decks and porches should have guardrails on the edges.  Have any leaves, snow, or ice cleared regularly.  Use sand or salt on walking paths during winter.  Clean up any spills in your garage right away. This includes oil or grease spills. What can I do in the bathroom?  Use night lights.  Install grab bars by the toilet and in the tub and shower. Do  not use towel bars as grab bars.  Use non-skid mats or decals in the tub or shower.  If you need to sit down in the shower, use a plastic, non-slip stool.  Keep the floor dry. Clean up any water that spills on the floor as soon as it happens.  Remove soap buildup in the tub or shower regularly.  Attach bath mats securely with double-sided non-slip rug tape.  Do not have throw rugs and other things on the floor that can make you trip. What can I do in the bedroom?  Use night lights.  Make sure that you have a light by your bed that is easy to reach.  Do not use any sheets or blankets that are too big for your bed. They should not hang down onto the floor.  Have a firm chair that has side arms. You can use this for support while you get dressed.  Do not have throw rugs and other things on the floor that can make you trip. What can I do in the kitchen?  Clean up any spills right away.  Avoid walking on wet floors.  Keep items that you use a lot in easy-to-reach  places.  If you need to reach something above you, use a strong step stool that has a grab bar.  Keep electrical cords out of the way.  Do not use floor polish or wax that makes floors slippery. If you must use wax, use non-skid floor wax.  Do not have throw rugs and other things on the floor that can make you trip. What can I do with my stairs?  Do not leave any items on the stairs.  Make sure that there are handrails on both sides of the stairs and use them. Fix handrails that are broken or loose. Make sure that handrails are as long as the stairways.  Check any carpeting to make sure that it is firmly attached to the stairs. Fix any carpet that is loose or worn.  Avoid having throw rugs at the top or bottom of the stairs. If you do have throw rugs, attach them to the floor with carpet tape.  Make sure that you have a light switch at the top of the stairs and the bottom of the stairs. If you do not have them,  ask someone to add them for you. What else can I do to help prevent falls?  Wear shoes that:  Do not have high heels.  Have rubber bottoms.  Are comfortable and fit you well.  Are closed at the toe. Do not wear sandals.  If you use a stepladder:  Make sure that it is fully opened. Do not climb a closed stepladder.  Make sure that both sides of the stepladder are locked into place.  Ask someone to hold it for you, if possible.  Clearly mark and make sure that you can see:  Any grab bars or handrails.  First and last steps.  Where the edge of each step is.  Use tools that help you move around (mobility aids) if they are needed. These include:  Canes.  Walkers.  Scooters.  Crutches.  Turn on the lights when you go into a dark area. Replace any light bulbs as soon as they burn out.  Set up your furniture so you have a clear path. Avoid moving your furniture around.  If any of your floors are uneven, fix them.  If there are any pets around you, be aware of where they are.  Review your medicines with your doctor. Some medicines can make you feel dizzy. This can increase your chance of falling. Ask your doctor what other things that you can do to help prevent falls. This information is not intended to replace advice given to you by your health care provider. Make sure you discuss any questions you have with your health care provider. Document Released: 02/08/2009 Document Revised: 09/20/2015 Document Reviewed: 05/19/2014 Elsevier Interactive Patient Education  2017 [employer].

## 2018-10-20 ENCOUNTER — Ambulatory Visit: Payer: PPO | Admitting: Allergy & Immunology

## 2018-10-21 DIAGNOSIS — F411 Generalized anxiety disorder: Secondary | ICD-10-CM | POA: Diagnosis not present

## 2018-10-21 DIAGNOSIS — F5001 Anorexia nervosa, restricting type: Secondary | ICD-10-CM | POA: Diagnosis not present

## 2018-10-21 DIAGNOSIS — F331 Major depressive disorder, recurrent, moderate: Secondary | ICD-10-CM | POA: Diagnosis not present

## 2018-10-25 ENCOUNTER — Ambulatory Visit (HOSPITAL_COMMUNITY): Payer: Self-pay

## 2018-10-25 ENCOUNTER — Ambulatory Visit (HOSPITAL_COMMUNITY): Payer: PPO | Admitting: Hematology

## 2018-10-26 DIAGNOSIS — G608 Other hereditary and idiopathic neuropathies: Secondary | ICD-10-CM | POA: Diagnosis not present

## 2018-10-26 DIAGNOSIS — Z87891 Personal history of nicotine dependence: Secondary | ICD-10-CM | POA: Diagnosis not present

## 2018-11-02 ENCOUNTER — Inpatient Hospital Stay (HOSPITAL_COMMUNITY): Payer: PPO | Attending: Hematology

## 2018-11-02 ENCOUNTER — Other Ambulatory Visit: Payer: Self-pay

## 2018-11-02 DIAGNOSIS — Z79899 Other long term (current) drug therapy: Secondary | ICD-10-CM | POA: Insufficient documentation

## 2018-11-02 DIAGNOSIS — Z9884 Bariatric surgery status: Secondary | ICD-10-CM | POA: Diagnosis not present

## 2018-11-02 DIAGNOSIS — E876 Hypokalemia: Secondary | ICD-10-CM | POA: Diagnosis not present

## 2018-11-02 DIAGNOSIS — D509 Iron deficiency anemia, unspecified: Secondary | ICD-10-CM | POA: Insufficient documentation

## 2018-11-02 DIAGNOSIS — D500 Iron deficiency anemia secondary to blood loss (chronic): Secondary | ICD-10-CM

## 2018-11-02 LAB — CBC WITH DIFFERENTIAL/PLATELET
Abs Immature Granulocytes: 0.02 10*3/uL (ref 0.00–0.07)
Basophils Absolute: 0 10*3/uL (ref 0.0–0.1)
Basophils Relative: 0 %
Eosinophils Absolute: 0.1 10*3/uL (ref 0.0–0.5)
Eosinophils Relative: 1 %
HCT: 43.3 % (ref 36.0–46.0)
Hemoglobin: 14.4 g/dL (ref 12.0–15.0)
Immature Granulocytes: 0 %
Lymphocytes Relative: 24 %
Lymphs Abs: 1.9 10*3/uL (ref 0.7–4.0)
MCH: 33.7 pg (ref 26.0–34.0)
MCHC: 33.3 g/dL (ref 30.0–36.0)
MCV: 101.4 fL — ABNORMAL HIGH (ref 80.0–100.0)
Monocytes Absolute: 0.4 10*3/uL (ref 0.1–1.0)
Monocytes Relative: 5 %
Neutro Abs: 5.7 10*3/uL (ref 1.7–7.7)
Neutrophils Relative %: 70 %
Platelets: 293 10*3/uL (ref 150–400)
RBC: 4.27 MIL/uL (ref 3.87–5.11)
RDW: 12.5 % (ref 11.5–15.5)
WBC: 8.2 10*3/uL (ref 4.0–10.5)
nRBC: 0 % (ref 0.0–0.2)

## 2018-11-02 LAB — COMPREHENSIVE METABOLIC PANEL
ALT: 37 U/L (ref 0–44)
AST: 30 U/L (ref 15–41)
Albumin: 5.1 g/dL — ABNORMAL HIGH (ref 3.5–5.0)
Alkaline Phosphatase: 72 U/L (ref 38–126)
Anion gap: 11 (ref 5–15)
BUN: 15 mg/dL (ref 6–20)
CO2: 25 mmol/L (ref 22–32)
Calcium: 9.3 mg/dL (ref 8.9–10.3)
Chloride: 105 mmol/L (ref 98–111)
Creatinine, Ser: 0.74 mg/dL (ref 0.44–1.00)
GFR calc Af Amer: >60 mL/min (ref >60)
GFR calc non Af Amer: >60 mL/min (ref >60)
Glucose, Bld: 90 mg/dL (ref 70–99)
Potassium: 3.8 mmol/L (ref 3.5–5.1)
Sodium: 141 mmol/L (ref 135–145)
Total Bilirubin: 0.5 mg/dL (ref 0.3–1.2)
Total Protein: 7.5 g/dL (ref 6.5–8.1)

## 2018-11-02 LAB — FERRITIN: Ferritin: 51 ng/mL (ref 11–307)

## 2018-11-02 LAB — LACTATE DEHYDROGENASE: LDH: 214 U/L — ABNORMAL HIGH (ref 98–192)

## 2018-11-03 DIAGNOSIS — F331 Major depressive disorder, recurrent, moderate: Secondary | ICD-10-CM | POA: Diagnosis not present

## 2018-11-03 DIAGNOSIS — F411 Generalized anxiety disorder: Secondary | ICD-10-CM | POA: Diagnosis not present

## 2018-11-03 DIAGNOSIS — F5001 Anorexia nervosa, restricting type: Secondary | ICD-10-CM | POA: Diagnosis not present

## 2018-11-03 NOTE — Progress Notes (Signed)
For review.  Please update ordering provider

## 2018-11-09 ENCOUNTER — Encounter (HOSPITAL_COMMUNITY): Payer: Self-pay | Admitting: Hematology

## 2018-11-09 ENCOUNTER — Telehealth (HOSPITAL_BASED_OUTPATIENT_CLINIC_OR_DEPARTMENT_OTHER): Payer: PPO | Admitting: Hematology

## 2018-11-09 ENCOUNTER — Ambulatory Visit (HOSPITAL_COMMUNITY): Payer: PPO | Admitting: Hematology

## 2018-11-09 DIAGNOSIS — K909 Intestinal malabsorption, unspecified: Secondary | ICD-10-CM

## 2018-11-09 DIAGNOSIS — D508 Other iron deficiency anemias: Secondary | ICD-10-CM

## 2018-11-09 DIAGNOSIS — Z9884 Bariatric surgery status: Secondary | ICD-10-CM | POA: Diagnosis not present

## 2018-11-09 DIAGNOSIS — D500 Iron deficiency anemia secondary to blood loss (chronic): Secondary | ICD-10-CM

## 2018-11-09 NOTE — Patient Instructions (Signed)
[place] at [facility]  Discharge Instructions:  You will talk to Harriet Pho, NP, with my chart video today. _______________________________________________________________  Thank you for choosing [place] at [facility] to provide your oncology and hematology care.  To afford each patient quality time with our providers, please arrive at least 15 minutes before your scheduled appointment.  You need to re-schedule your appointment if you arrive 10 or more minutes late.  We strive to give you quality time with our providers, and arriving late affects you and other patients whose appointments are after yours.  Also, if you no show three or more times for appointments you may be dismissed from the clinic.  Again, thank you for choosing [place] [facility] hope is that these requests will allow you access to exceptional care and in a timely manner. _______________________________________________________________  If you have questions after your visit, please contact our office at [phone] between the hours of 8:30 a.m. and 5:00 p.m. Voicemails left after 4:30 p.m. will not be returned until the following business day. _______________________________________________________________  For prescription refill requests, have your pharmacy contact our office. _______________________________________________________________  Recommendations made by the consultant and any test results will be sent to your referring physician. _______________________________________________________________

## 2018-11-09 NOTE — Progress Notes (Signed)
Virtual Visit via Telephone Note  I connected with Katelyn [name] on 11/09/18 at 11:50 AM EDT by telephone and verified that I am speaking with the correct person using two identifiers.   I discussed the limitations, risks, security and privacy concerns of performing an evaluation and management service by telephone and the availability of in person appointments. I also discussed with the patient that there may be a patient responsible charge related to this service. The patient expressed understanding and agreed to proceed.   History of Present Illness: Iron deficiency anemia, secondary to malabsorption from gastric bypass surgery    Observations/Objective: Ms. Kaczynski is interviewed by video chat.  Reports overall doing well.  She reports chronic fatigue secondary to chronic pain syndrome.   She denies any obvious signs of bleeding.  She states she does follow with a nutritionist regularly.  This she receives adequate nutrition in her diet.  She continues on sublingual vitamin B12, as well as p.o. iron.  She denies any changes in bowel habits.  She has chronic constipation.  We will discuss her recent labs today.   Assessment and Plan: 1.Iron deficiency anemia secondary to malabsorption - S/P gastric bypass and on chronic PPI therapy.   - Continue with IV iron as needed. Had previous allergic reaction to [facility]. Has tolerated 2 doses of Infectafer without complaints. Last dose 03/11/2018 .  - Hemoglobin has been stable in the 14 g/dL range, since her last dose of iron. - Continue to monitor patient return to clinic in 3 months.   Follow Up Instructions:    I discussed the assessment and treatment plan with the patient. The patient was provided an opportunity to ask questions and all were answered. The patient agreed with the plan and demonstrated an understanding of the instructions.   The patient was advised to call back or seek an in-person evaluation if the symptoms worsen or  if the condition fails to improve as anticipated.  I provided 15 minutes of non-face-to-face time during this encounter.   Roger Shelter, FNP

## 2018-11-17 DIAGNOSIS — F411 Generalized anxiety disorder: Secondary | ICD-10-CM | POA: Diagnosis not present

## 2018-11-17 DIAGNOSIS — F5001 Anorexia nervosa, restricting type: Secondary | ICD-10-CM | POA: Diagnosis not present

## 2018-11-17 DIAGNOSIS — F331 Major depressive disorder, recurrent, moderate: Secondary | ICD-10-CM | POA: Diagnosis not present

## 2018-11-18 DIAGNOSIS — M47812 Spondylosis without myelopathy or radiculopathy, cervical region: Secondary | ICD-10-CM | POA: Diagnosis not present

## 2018-11-18 DIAGNOSIS — M47816 Spondylosis without myelopathy or radiculopathy, lumbar region: Secondary | ICD-10-CM | POA: Diagnosis not present

## 2018-11-18 DIAGNOSIS — G8929 Other chronic pain: Secondary | ICD-10-CM | POA: Diagnosis not present

## 2018-11-18 DIAGNOSIS — M533 Sacrococcygeal disorders, not elsewhere classified: Secondary | ICD-10-CM | POA: Diagnosis not present

## 2018-11-18 DIAGNOSIS — R102 Pelvic and perineal pain: Secondary | ICD-10-CM | POA: Diagnosis not present

## 2018-11-18 DIAGNOSIS — G894 Chronic pain syndrome: Secondary | ICD-10-CM | POA: Diagnosis not present

## 2018-11-18 DIAGNOSIS — M7918 Myalgia, other site: Secondary | ICD-10-CM | POA: Diagnosis not present

## 2018-11-22 ENCOUNTER — Encounter: Payer: Self-pay | Admitting: Pulmonary Disease

## 2018-11-22 ENCOUNTER — Other Ambulatory Visit: Payer: Self-pay

## 2018-11-22 ENCOUNTER — Ambulatory Visit (INDEPENDENT_AMBULATORY_CARE_PROVIDER_SITE_OTHER): Payer: PPO | Admitting: Pulmonary Disease

## 2018-11-22 VITALS — BP 122/60 | HR 97 | Temp 98.3°F | Ht 65.5 in | Wt 144.0 lb

## 2018-11-22 DIAGNOSIS — R0789 Other chest pain: Secondary | ICD-10-CM | POA: Diagnosis not present

## 2018-11-22 MED ORDER — BUDESONIDE-FORMOTEROL FUMARATE 80-4.5 MCG/ACT IN AERO: 2.0000 | INHALATION_SPRAY | Freq: Two times a day (BID) | RESPIRATORY_TRACT | 0 refills | Status: DC

## 2018-11-22 MED ORDER — ALBUTEROL SULFATE HFA 108 (90 BASE) MCG/ACT IN AERS: 2.0000 | INHALATION_SPRAY | Freq: Four times a day (QID) | RESPIRATORY_TRACT | 6 refills | Status: DC | PRN

## 2018-11-22 NOTE — Progress Notes (Signed)
Patient seen in the office today and instructed on use of symbicort inhaler.  Patient expressed understanding and demonstrated technique.

## 2018-11-22 NOTE — Progress Notes (Signed)
Subjective:   PATIENT ID: Katelyn [name] GENDER: female DOB: [birth year], MRN: [id number]   HPI  Chief Complaint  Patient presents with   Consult    started end of summer 2019 bug spray areosol burning when breathing and shortness of breath another reaction to yard treatment, now all scents burns her chest.   Reason for Visit: New consult for dyspnea  Katelyn [name] is a 43 year old female with history of tachybradycardia syndrome, depression, anxiety, migraine, PCOS and neuropathy who presents for evaluation of allergies.  She was referred by Dr. Moshe Cipro in [place] primary care.  PCP note from 08/24/2018 reviewed and summarized as follows: Since 2019 after an exposure to bug spray, she had burning sensation in her lungs.  On exposures to weed killer in the fall time, she has had recurrent burning sensation in her lungs difficulty getting air in.  Associated with change in taste and smell.  PCP referred her to an allergist.  In our clinic, she reports shortness of breath and chest "burning" when exposed to certain irritants in the last year. She reports that has been a persistent burning sensation that took 4-5 months before gradually improving however this spring, this symptom recurred with increased intensity when husband was putting down fertilizer in the yard. Now any irritant including laundry detergent, shampoos, hand sanitizer, deodarants and any scented products. If she removes the irritant (washing hands, removing clothes), it will take 30-60 minutes for the intensity of the burning will subside. She describes the irritation located in the throat and deep irritation feeling like it is in the lungs. However she has a constant low-level burn. She has switched to unscented products and this has helped. Denies coughing, wheezing, cough. Has never used any inhalers.   She does not thing her dyspnea is not necessarily associated with this burning sensation. She has had  shortness of breath for the last year. This worsens with exertion including ambulating in the house. She believes due to her deconditioning since her breast implant in October 2018 where afterwards she had severe chronic pain from unclear etiology. She has been evaluated for [facility] and recommended management of symptoms as work-up negative per patient.   Social History: Former smoker.  5-pack-year history.  Quit in [place] exposures: As above  I have personally reviewed patient's past medical/family/social history, allergies, current medications.  Past Medical History:  Diagnosis Date   Anemia    Anorexia    Anxiety    Benign juvenile melanoma    Chronic headaches    Colon polyps    found on colonoscopy 44/04/270   Complication of anesthesia    itching after epidural for c section   Constipation    Depression    Depression    several suicide attempts, hospitaluzed in 2012 for this; hx pf ECT treatments ; pt sees Dr. Adele Schilder pyschiatrist  and doing well on medication   Headache    Heart murmur    as a child - no one mentions hearing murmur anymore   Heartburn    no meds   History of pneumonia    x 3  years ago - no recent problems   Hx of blood clots    Obesity    Pneumonia    Polycystic ovary    takes metformin to treat   Skin lesion    Excisional biopsy of moles - none cancerous   Sleep apnea    yrs ago - diagnosed mild sleep apnea -  did not have to use cpap      Family History  Problem Relation Age of Onset   Hypercholesterolemia Mother    Hypertension Mother        Iterstitial Cystist   Hyperlipidemia Mother    Cancer Mother 58       breast    Depression Brother    Alcohol abuse Brother    Colon polyps Father    Depression Father    Irritable bowel syndrome Father    Alcohol abuse Father    Colon cancer Paternal Aunt 52   Heart attack Paternal Grandfather    Kidney cancer Paternal Grandfather    Cancer Maternal  Grandfather        unknown type     Social History   Occupational History   Occupation: Unemployed Therapist, sports   Occupation: Disabled    Fish farm manager: UNEMPLOYED  Tobacco Use   Smoking status: Former Smoker    Packs/day: 1.00    Years: 5.00    Pack years: 5.00    Types: Cigarettes    Start date: 1994    Quit date: 08/26/1997    Years since quitting: 21.2   Smokeless tobacco: Never Used  Substance and Sexual Activity   Alcohol use: No    Alcohol/week: 0.0 standard drinks   Drug use: No   Sexual activity: Yes    Partners: Male    Birth control/protection: Surgical, Other-see comments    Allergies  Allergen Reactions   Ciprofloxacin Other (See Comments)    Nerve pain  Nerve pain  Nerve pain    Duloxetine Hcl Anaphylaxis    Make her more depressed, having nausea, vomiting, headache and threw up. Make her more depressed, having nausea, vomiting, headache and threw up.   Lyrica [Pregabalin] Anaphylaxis   Feraheme [Ferumoxytol] Hives   Penicillins     Has patient had a PCN reaction causing immediate rash, facial/tongue/throat swelling, SOB or lightheadedness with hypotension: Yes Has patient had a PCN reaction causing severe rash involving mucus membranes or skin necrosis: No Has patient had a PCN reaction that required hospitalization No Has patient had a PCN reaction occurring within the last 10 years: No If all of the above answers are "NO", then may proceed with Cephalosporin use.     REACTION: Rash   Vit B12-Methionine-Inos-Chol      Outpatient Medications Prior to Visit  Medication Sig Dispense Refill   buPROPion (WELLBUTRIN) 75 MG tablet Take 2 tab in AM 60 tablet 2   clonazePAM (KLONOPIN) 0.5 MG tablet Take 1 tablet (0.5 mg total) by mouth 2 (two) times daily as needed for anxiety. 60 tablet 2   Cyanocobalamin (B-12) 1000 MCG SUBL Place 1,000 mcg under the tongue daily. 30 each 6   fluconazole (DIFLUCAN) 150 MG tablet Take one tablet once daily as needed  for yeast infection 2 tablet 1   gabapentin (NEURONTIN) 600 MG tablet Take 600 mg by mouth 3 (three) times daily.     HYDROcodone-acetaminophen (NORCO) 7.5-325 MG tablet Take 1 tablet by mouth every 6 (six) hours as needed for moderate pain.     Multiple Vitamin (MULTIVITAMIN WITH MINERALS) TABS tablet Take 3 tablets by mouth 2 (two) times daily.      naloxone (NARCAN) nasal spray 4 mg/0.1 mL Place into the nose as needed.      ondansetron (ZOFRAN) 4 MG tablet Take 1 tablet (4 mg total) by mouth every 8 (eight) hours as needed for nausea or vomiting. 20 tablet 0  PREMARIN vaginal cream 2 times a week     temazepam (RESTORIL) 22.5 MG capsule Take 1 capsule (22.5 mg total) by mouth at bedtime. 30 capsule 2   tiZANidine (ZANAFLEX) 4 MG tablet Take 4 mg by mouth 3 (three) times daily as needed for muscle spasms.     topiramate (TOPAMAX) 50 MG tablet Take 1 tablet (50 mg total) by mouth daily. 30 tablet 1   ziprasidone (GEODON) 60 MG capsule Take 1 capsule (60 mg total) by mouth at bedtime. 30 capsule 2   No facility-administered medications prior to visit.     Review of Systems  Constitutional: Negative for chills, diaphoresis, fever, malaise/fatigue and weight loss.  HENT: Positive for congestion. Negative for ear pain and sore throat.   Respiratory: Positive for shortness of breath. Negative for cough, hemoptysis, sputum production and wheezing.   Cardiovascular: Positive for palpitations. Negative for chest pain and leg swelling.  Gastrointestinal: Negative for abdominal pain, heartburn and nausea.  Genitourinary: Negative for frequency.  Musculoskeletal: Negative for joint pain and myalgias.       Joint swelling  Skin: Negative for itching and rash.  Neurological: Positive for headaches. Negative for dizziness and weakness.  Endo/Heme/Allergies: Does not bruise/bleed easily.  Psychiatric/Behavioral: Positive for depression. The patient is nervous/anxious.      Objective:    Vitals:   11/22/18 0916 11/22/18 0917  BP:  122/60  Pulse:  97  Temp: 98.3 F (36.8 C)   TempSrc: Oral   SpO2:  99%  Weight: 144 lb (65.3 kg)   Height: 5' 5.5" (1.664 m)    SpO2: 99 % O2 Device: None (Room air)  Physical Exam: General: Well-appearing thin female, no acute distress HENT: [place], AT Eyes: EOMI, no scleral icterus Respiratory: Clear to auscultation bilaterally.  No crackles, wheezing or rales Cardiovascular: RRR, -M/R/G, no JVD GI: BS+, soft, nontender Extremities:-Edema,-tenderness Neuro: AAO x4, CNII-XII grossly intact Skin: Intact, no rashes or bruising Psych: Normal mood, normal affect  Data Reviewed:  Imaging: CXR 08/04/2017-no acute cardiopulmonary abnormalities.  Mild hyperinflation without flattening of the diaphragms CXR 12/09/2016-mild hyperinflation.  No infiltrates, effusion or edema.  PFT: None on file  Labs: CBC and CMP 11/02/2018-unremarkable.  Eosinophils 100  Imaging, labs and tests noted above have been reviewed independently by me.    Assessment & Plan:   Discussion: 43 year old female who presents with atypical symptoms that may represent reactive airway dysfunction or irritant induced asthma. Doubt this represents infectious process even chronicity of her symptoms. Deconditioning may contribute to her overall dyspnea but would not explain her chest irritation/tightness.  Administered Symbicort in-office to monitor for any symptoms as patient has reports reactions in the past to various medications including injections and pills. Has never used an inhaler and concerned that she may have respiratory symptoms.Provided education on how to use inhaler and monitored patient for 30 minutes total, checking every 15 minutes for any symptoms. She was discharged from clinic after clinical monitoring. Tolerated inhaler without issues.  Medications:  Provide Symbicort 80-4.5 mcg 1 puff twice a day   Prescribe Albuterol 2 puffs as needed every 4-6  hours for shortness of breath or wheezing. THIS IS RESCUE INHALER   Tests ordered:  Pulmonary function test             RAST panel  Other recommendations:  Minimize exposures  Continue physical therapy at home  Health Maintenance Pneumonia-not indicated Influenza 01/13/2018 CT Lung Screen-not indicated  Orders Placed This Encounter  Procedures  Resp Allergy Profile Regn2DC DE MD [facility]    Standing Status:   Future    Number of Occurrences:   1    Standing Expiration Date:   05/24/2020   Pulmonary function test    Standing Status:   Future    Standing Expiration Date:   11/22/2019    Order Specific Question:   Where should this test be performed?    Answer:   [facility]    Order Specific Question:   Full PFT: includes the following: basic spirometry, spirometry pre & post bronchodilator, diffusion capacity (DLCO), lung volumes    Answer:   Full PFT   Meds ordered this encounter  Medications   albuterol (VENTOLIN HFA) 108 (90 Base) MCG/ACT inhaler    Sig: Inhale 2 puffs into the lungs every 6 (six) hours as needed for wheezing or shortness of breath.    Dispense:  8 g    Refill:  6   budesonide-formoterol (SYMBICORT) 80-4.5 MCG/ACT inhaler    Sig: Inhale 2 puffs into the lungs 2 (two) times daily.    Dispense:  1 Inhaler    Refill:  0    Order Specific Question:   Lot Number?    Answer:   [id number]    Order Specific Question:   Expiration Date?    Answer:   08/27/2019    Order Specific Question:   Manufacturer?    Answer:   [employer] [[id number]]    Order Specific Question:   Quantity    Answer:   2   Greater than 50% of this patient 80-minute office visit was spent face-to-face in counseling with the patient/family. We discussed medical diagnosis and treatment plan as noted.   Return in about 1 month (around 12/23/2018).  [place], MD [facility] 11/22/2018 10:15 AM  Office Number [phone]

## 2018-11-22 NOTE — Patient Instructions (Signed)
Medications:  Provide Symbicort 80-4.5 mcg 1 puff twice a day   Prescribe Albuterol 2 puffs as needed every 4-6 hours for shortness of breath or wheezing. THIS IS RESCUE INHALER   Tests ordered:  Pulmonary function test             RAST panel  Other recommendations:  Minimize exposures  Continue physical therapy at home  Follow-up in 1 month

## 2018-11-23 LAB — RESPIRATORY ALLERGY PROFILE REGION II ~~LOC~~

## 2018-11-23 LAB — INTERPRETATION:

## 2018-11-24 ENCOUNTER — Ambulatory Visit: Payer: PPO | Admitting: Allergy & Immunology

## 2018-11-24 DIAGNOSIS — Z79891 Long term (current) use of opiate analgesic: Secondary | ICD-10-CM | POA: Diagnosis not present

## 2018-11-30 DIAGNOSIS — F411 Generalized anxiety disorder: Secondary | ICD-10-CM | POA: Diagnosis not present

## 2018-11-30 DIAGNOSIS — F331 Major depressive disorder, recurrent, moderate: Secondary | ICD-10-CM | POA: Diagnosis not present

## 2018-11-30 DIAGNOSIS — F5001 Anorexia nervosa, restricting type: Secondary | ICD-10-CM | POA: Diagnosis not present

## 2018-12-03 DIAGNOSIS — M545 Low back pain: Secondary | ICD-10-CM | POA: Diagnosis not present

## 2018-12-03 DIAGNOSIS — R208 Other disturbances of skin sensation: Secondary | ICD-10-CM | POA: Diagnosis not present

## 2018-12-03 DIAGNOSIS — R3911 Hesitancy of micturition: Secondary | ICD-10-CM | POA: Diagnosis not present

## 2018-12-03 DIAGNOSIS — M6281 Muscle weakness (generalized): Secondary | ICD-10-CM | POA: Diagnosis not present

## 2018-12-03 DIAGNOSIS — M62838 Other muscle spasm: Secondary | ICD-10-CM | POA: Diagnosis not present

## 2018-12-03 DIAGNOSIS — N3943 Post-void dribbling: Secondary | ICD-10-CM | POA: Diagnosis not present

## 2018-12-10 DIAGNOSIS — R208 Other disturbances of skin sensation: Secondary | ICD-10-CM | POA: Diagnosis not present

## 2018-12-10 DIAGNOSIS — M545 Low back pain: Secondary | ICD-10-CM | POA: Diagnosis not present

## 2018-12-10 DIAGNOSIS — N3943 Post-void dribbling: Secondary | ICD-10-CM | POA: Diagnosis not present

## 2018-12-10 DIAGNOSIS — M62838 Other muscle spasm: Secondary | ICD-10-CM | POA: Diagnosis not present

## 2018-12-10 DIAGNOSIS — M6281 Muscle weakness (generalized): Secondary | ICD-10-CM | POA: Diagnosis not present

## 2018-12-10 DIAGNOSIS — R3911 Hesitancy of micturition: Secondary | ICD-10-CM | POA: Diagnosis not present

## 2018-12-14 ENCOUNTER — Other Ambulatory Visit: Payer: Self-pay

## 2018-12-14 ENCOUNTER — Encounter: Payer: Self-pay | Admitting: Student

## 2018-12-14 ENCOUNTER — Ambulatory Visit (INDEPENDENT_AMBULATORY_CARE_PROVIDER_SITE_OTHER): Payer: PPO | Admitting: Student

## 2018-12-14 VITALS — BP 128/81 | Temp 97.5°F | Ht 65.5 in | Wt 144.0 lb

## 2018-12-14 DIAGNOSIS — D508 Other iron deficiency anemias: Secondary | ICD-10-CM

## 2018-12-14 DIAGNOSIS — R002 Palpitations: Secondary | ICD-10-CM | POA: Diagnosis not present

## 2018-12-14 DIAGNOSIS — I493 Ventricular premature depolarization: Secondary | ICD-10-CM

## 2018-12-14 MED ORDER — METOPROLOL TARTRATE 25 MG PO TABS: 12.5000 mg | ORAL_TABLET | Freq: Two times a day (BID) | ORAL | 3 refills | Status: DC | PRN

## 2018-12-14 NOTE — Patient Instructions (Signed)
Medication Instructions:  Your physician has recommended you make the following change in your medication:  Take Lopressor 12.5 Two Times Daily As Needed For Palpitations   If you need a refill on your cardiac medications before your next appointment, please call your pharmacy.   Lab work: NONE   If you have labs (blood work) drawn today and your tests are completely normal, you will receive your results only by: Marland Kitchen MyChart Message (if you have MyChart) OR . A paper copy in the mail If you have any lab test that is abnormal or we need to change your treatment, we will call you to review the results.  Testing/Procedures: NONE   Follow-Up: At [facility], you and your health needs are our priority.  As part of our continuing mission to provide you with exceptional heart care, we have created designated Provider Care Teams.  These Care Teams include your primary Cardiologist (physician) and Advanced Practice Providers (APPs -  Physician Assistants and Nurse Practitioners) who all work together to provide you with the care you need, when you need it. You will need a follow up appointment in 3-4 months.  Please call our office 2 months in advance to schedule this appointment.  You may see Kate Sable, MD or one of the following Advanced Practice Providers on your designated Care Team:   Bernerd Pho, PA-C [facility]) . Ermalinda Barrios, PA-C ([place])  Any Other Special Instructions Will Be Listed Below (If Applicable). Thank you for choosing [facility]!
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
## 2018-12-15 DIAGNOSIS — F331 Major depressive disorder, recurrent, moderate: Secondary | ICD-10-CM | POA: Diagnosis not present

## 2018-12-15 DIAGNOSIS — F5001 Anorexia nervosa, restricting type: Secondary | ICD-10-CM | POA: Diagnosis not present

## 2018-12-15 DIAGNOSIS — F411 Generalized anxiety disorder: Secondary | ICD-10-CM | POA: Diagnosis not present

## 2018-12-16 DIAGNOSIS — R208 Other disturbances of skin sensation: Secondary | ICD-10-CM | POA: Diagnosis not present

## 2018-12-16 DIAGNOSIS — N3943 Post-void dribbling: Secondary | ICD-10-CM | POA: Diagnosis not present

## 2018-12-16 DIAGNOSIS — M6281 Muscle weakness (generalized): Secondary | ICD-10-CM | POA: Diagnosis not present

## 2018-12-16 DIAGNOSIS — R3911 Hesitancy of micturition: Secondary | ICD-10-CM | POA: Diagnosis not present

## 2018-12-16 DIAGNOSIS — M62838 Other muscle spasm: Secondary | ICD-10-CM | POA: Diagnosis not present

## 2018-12-16 DIAGNOSIS — M545 Low back pain: Secondary | ICD-10-CM | POA: Diagnosis not present

## 2018-12-17 ENCOUNTER — Ambulatory Visit: Payer: PPO | Admitting: Allergy & Immunology

## 2018-12-24 DIAGNOSIS — M6281 Muscle weakness (generalized): Secondary | ICD-10-CM | POA: Diagnosis not present

## 2018-12-24 DIAGNOSIS — R3911 Hesitancy of micturition: Secondary | ICD-10-CM | POA: Diagnosis not present

## 2018-12-24 DIAGNOSIS — M545 Low back pain: Secondary | ICD-10-CM | POA: Diagnosis not present

## 2018-12-24 DIAGNOSIS — M62838 Other muscle spasm: Secondary | ICD-10-CM | POA: Diagnosis not present

## 2018-12-24 DIAGNOSIS — R208 Other disturbances of skin sensation: Secondary | ICD-10-CM | POA: Diagnosis not present

## 2018-12-24 DIAGNOSIS — N3943 Post-void dribbling: Secondary | ICD-10-CM | POA: Diagnosis not present

## 2018-12-28 ENCOUNTER — Ambulatory Visit (INDEPENDENT_AMBULATORY_CARE_PROVIDER_SITE_OTHER): Payer: PPO | Admitting: Psychiatry

## 2018-12-28 ENCOUNTER — Other Ambulatory Visit: Payer: Self-pay

## 2018-12-28 ENCOUNTER — Encounter (HOSPITAL_COMMUNITY): Payer: Self-pay | Admitting: Psychiatry

## 2018-12-28 DIAGNOSIS — F411 Generalized anxiety disorder: Secondary | ICD-10-CM

## 2018-12-28 DIAGNOSIS — F509 Eating disorder, unspecified: Secondary | ICD-10-CM | POA: Diagnosis not present

## 2018-12-28 DIAGNOSIS — F331 Major depressive disorder, recurrent, moderate: Secondary | ICD-10-CM

## 2018-12-28 MED ORDER — CLONAZEPAM 0.5 MG PO TABS: 0.5000 mg | ORAL_TABLET | Freq: Two times a day (BID) | ORAL | 1 refills | Status: DC | PRN

## 2018-12-28 MED ORDER — BUPROPION HCL 75 MG PO TABS: ORAL_TABLET | ORAL | 2 refills | Status: DC

## 2018-12-28 MED ORDER — TEMAZEPAM 22.5 MG PO CAPS: 22.5000 mg | ORAL_CAPSULE | Freq: Every day | ORAL | 2 refills | Status: DC

## 2018-12-28 MED ORDER — ZIPRASIDONE HCL 60 MG PO CAPS: 60.0000 mg | ORAL_CAPSULE | Freq: Every day | ORAL | 2 refills | Status: DC

## 2018-12-28 NOTE — Progress Notes (Signed)
Virtual Visit via Telephone Note  I connected with Katelyn [name] on 12/28/18 at  9:00 AM EDT by telephone and verified that I am speaking with the correct person using two identifiers.   I discussed the limitations, risks, security and privacy concerns of performing an evaluation and management service by telephone and the availability of in person appointments. I also discussed with the patient that there may be a patient responsible charge related to this service. The patient expressed understanding and agreed to proceed.   History of Present Illness: Patient was evaluated by phone session.  She admitted frustration and irritability due to her general health.  Lately she is having palpitation and she saw cardiologist who mentioned she may have occasional PVC and recommended Holter and started metoprolol.  She also feel her neuropathy is not getting better despite taking gabapentin and pain medication.  She still have a lot of sensitivity and cannot sit down for a long period of time.  She still have breathing issues which she had at 2 months ago due to chemical pneumonia.  Patient denies any suicidal thoughts or homicidal thoughts but admitted her health condition is making her more nervous.  She gets sometimes irritable for no reason.  She is sleeping 5 to 6 hours with temazepam.  She has chronic pain.  She is seen nutritionist and her therapist on a regular basis.  She admitted restricting her diet and she lost 3 pounds since the last visit.  However she is not purging.  Her nutritionist recommended that she need to gain more weight and she agreed with that.  Overall patient feel that she is less paranoid and her thinking is more clear and organized but her anxiety and nervousness remains the same.  She admitted not taking the Klonopin twice a day but does take as needed.  She has mild tremors but no other concern from the medication.  She is sad because not able to see her son Katelyn [name] since last  Christmas but able to talk to him on face time.  She is keeping social distancing because she knew her son does not keep social distance and she cannot take the chance to get infected.  Her husband is very supportive but lately her job hours are reduced.  She admitted some concern financially but there is no major issues at this time.  She denies any major panic attack.  She denies drinking or using any illegal substances.  Her energy level is fair.    Past Psychiatric History:Reviewed. H/Omultiple hospitalization. Last admission February 2015. Good response with ECT but scared to continue maintenance ECT. Tried Cymbalta, Lexapro, Abilify, lithium, Wellbutrin, Lamictal, Ritalin, Remeron, Risperdal, Neurontin, Vistaril, BuSpar, Prozac and Valium.  Psychiatric Specialty Exam: Physical Exam  ROS  There were no vitals taken for this visit.There is no height or weight on file to calculate BMI.  General Appearance: NA  Eye Contact:  NA  Speech:  Slow  Volume:  Decreased  Mood:  Anxious, Dysphoric and Irritable  Affect:  NA  Thought Process:  Goal Directed  Orientation:  Full (Time, Place, and Person)  Thought Content:  Rumination  Suicidal Thoughts:  No  Homicidal Thoughts:  No  Memory:  Immediate;   Good Recent;   Good Remote;   Good  Judgement:  Fair  Insight:  Fair  Psychomotor Activity:  Decreased  Concentration:  Concentration: Fair and Attention Span: Fair  Recall:  Good  Fund of Knowledge:  Good  Language:  Good  Akathisia:  No  Handed:  Right  AIMS (if indicated):     Assets:  Communication Skills Desire for Improvement Housing Resilience  ADL's:  Intact  Cognition:  WNL  Sleep:   5-6 hrs      Assessment and Plan: Generalized anxiety disorder.  Major depressive disorder, recurrent.  Eating disorder NOS.  I review her blood work results which was done in first week of July.  Her CBC and basic chemistry is normal other than higher albumin.  Encouraged to take the  Klonopin twice a day to help her irritability and anxiety.  She is concerned about her health issues that causes some anxiety.  Reassurance given.  Encouraged to continue therapy with nutritionist and her counselor on a regular basis.  Patient does not want to change her medication since it is keeping her stable.  She does not have any major side effects.  She promised that she will work on her restriction of her diet and gained few more pounds as recommended by nutritionist.  She is getting gabapentin, narcotic pain medication and Topamax from other provider.  I will continue Geodon 60 mg at bedtime, temazepam 22.5 mg at bedtime, Wellbutrin 75 mg 2 tablet twice a day, Klonopin 0.5 mg twice a day as needed.  Discussed polypharmacy.  Recommended to call us back if she has any question or any concern.  Follow-up in 3 months.  Follow Up Instructions:    I discussed the assessment and treatment plan with the patient. The patient was provided an opportunity to ask questions and all were answered. The patient agreed with the plan and demonstrated an understanding of the instructions.   The patient was advised to call back or seek an in-person evaluation if the symptoms worsen or if the condition fails to improve as anticipated.  I provided 20 minutes of non-face-to-face time during this encounter.   Kathlee Nations, MD

## 2018-12-30 ENCOUNTER — Ambulatory Visit (HOSPITAL_COMMUNITY): Payer: PPO | Admitting: Psychiatry

## 2018-12-30 DIAGNOSIS — R3911 Hesitancy of micturition: Secondary | ICD-10-CM | POA: Diagnosis not present

## 2018-12-30 DIAGNOSIS — M545 Low back pain: Secondary | ICD-10-CM | POA: Diagnosis not present

## 2018-12-30 DIAGNOSIS — N3943 Post-void dribbling: Secondary | ICD-10-CM | POA: Diagnosis not present

## 2018-12-30 DIAGNOSIS — M62838 Other muscle spasm: Secondary | ICD-10-CM | POA: Diagnosis not present

## 2018-12-30 DIAGNOSIS — F331 Major depressive disorder, recurrent, moderate: Secondary | ICD-10-CM | POA: Diagnosis not present

## 2018-12-30 DIAGNOSIS — F411 Generalized anxiety disorder: Secondary | ICD-10-CM | POA: Diagnosis not present

## 2018-12-30 DIAGNOSIS — F5001 Anorexia nervosa, restricting type: Secondary | ICD-10-CM | POA: Diagnosis not present

## 2018-12-30 DIAGNOSIS — R208 Other disturbances of skin sensation: Secondary | ICD-10-CM | POA: Diagnosis not present

## 2018-12-30 DIAGNOSIS — M6281 Muscle weakness (generalized): Secondary | ICD-10-CM | POA: Diagnosis not present

## 2019-01-04 ENCOUNTER — Other Ambulatory Visit: Payer: Self-pay

## 2019-01-04 ENCOUNTER — Ambulatory Visit: Payer: PPO | Admitting: Pulmonary Disease

## 2019-01-04 DIAGNOSIS — J45909 Unspecified asthma, uncomplicated: Secondary | ICD-10-CM | POA: Diagnosis not present

## 2019-01-04 DIAGNOSIS — Z23 Encounter for immunization: Secondary | ICD-10-CM

## 2019-01-04 MED ORDER — BUDESONIDE-FORMOTEROL FUMARATE 80-4.5 MCG/ACT IN AERO: 2.0000 | INHALATION_SPRAY | Freq: Two times a day (BID) | RESPIRATORY_TRACT | 6 refills | Status: DC

## 2019-01-04 MED ORDER — BUDESONIDE-FORMOTEROL FUMARATE 80-4.5 MCG/ACT IN AERO: 2.0000 | INHALATION_SPRAY | Freq: Two times a day (BID) | RESPIRATORY_TRACT | 0 refills | Status: DC

## 2019-01-04 NOTE — Patient Instructions (Signed)
  Irritant-induced asthma  Medications:  Provide sample and PRESCRIBE Symbicort 80-4.5 mcg 2 puffs twice a day   CONTINUE Albuterol 2 puffs as needed every 4-6 hours for shortness of breath or wheezing. THIS IS RESCUE INHALER   Asthma Action Plan:  Take Albuterol as needed for shortness of breath. If symptoms persistent including coughing, shortness of breath or wheezing, please call office to consider short-course of steroids.   Other recommendations:  Minimize exposures  Continue physical therapy at home  Follow-up with me via televideo visit in 3 months

## 2019-01-04 NOTE — Progress Notes (Signed)
Subjective:   PATIENT ID: Katelyn [name] GENDER: female DOB: 23-May-1975, MRN: [id number]   HPI  Chief Complaint  Patient presents with   Follow-up    new start symbicort 80 felts better on it   Reason for Visit: Follow-up for dyspnea  [name] is a 43 year old female with history of tachybradycardia syndrome, depression, anxiety, migraine, PCOS and small fiber neuropathy who presents for follow-up  She was initially evaluated by me on 11/22/2018.  Since 2019 after an exposure to bug spray, she had burning sensation in her lungs.  On exposures to weed killer in the fall time, she has had recurrent burning sensation in her lungs difficulty getting air in.  Associated with change in taste and smell.  On our initial visit she reported shortness of breath and chest "burning" when exposed to certain irritants in the last year. She reports that has been a persistent burning sensation that took 4-5 months before gradually improving however this spring, this symptom recurred with increased intensity when husband was putting down fertilizer in the yard. Now any irritant including laundry detergent, shampoos, hand sanitizer, deodarants and any scented products. If she removes the irritant (washing hands, removing clothes), it will take 30-60 minutes for the intensity of the burning will subside. She describes the irritation located in the throat and deep irritation feeling like it is in the lungs. However she has a constant low-level burn. She has switched to unscented products and this has helped.  She does not thing her dyspnea is not necessarily associated with this burning sensation. She has had shortness of breath for the last year. This worsens with exertion including ambulating in the house. She believes due to her deconditioning since her breast implant in October 2018 where afterwards she had severe chronic pain from unclear etiology. She has been evaluated for [facility] and  recommended management of symptoms as work-up negative per patient.   Interval History Since her last visit, she was given a trial of low-dose ICS/LABA and as needed rescue inhaler. She has been compliant with inhaler and reports symptoms improved 50% with low-level irritants. Still has symptoms of shortness of breath and chest burning when around finger more volatile irritants such as nail polish, bleach products. Denies wheezing. On those occasions she had to use the albuterol which was only twice since our last visit. She is avoiding triggers (dryer sheets, fertilizer, bug spray, cleaning products) and has switched to non-fragrant detergent and deodarant.    Social History: Former smoker.  5-pack-year history.  Quit in [place] exposures: As above  I have personally reviewed patient's past medical/family/social history, allergies and current medications.  Past Medical History:  Diagnosis Date   Anemia    Anorexia    Anxiety    Benign juvenile melanoma    Chronic headaches    Colon polyps    found on colonoscopy [id number]   Complication of anesthesia    itching after epidural for c section   Constipation    Depression    Depression    several suicide attempts, hospitaluzed in 2012 for this; hx pf ECT treatments ; pt sees Dr. Adele Schilder pyschiatrist  and doing well on medication   Headache    Heart murmur    as a child - no one mentions hearing murmur anymore   Heartburn    no meds   History of pneumonia    x 3  years ago - no recent problems   Hx of  blood clots    Obesity    Pneumonia    Polycystic ovary    takes metformin to treat   Skin lesion    Excisional biopsy of moles - none cancerous   Sleep apnea    yrs ago - diagnosed mild sleep apnea - did not have to use cpap      Family History  Problem Relation Age of Onset   Hypercholesterolemia Mother    Hypertension Mother        Iterstitial Cystist   Hyperlipidemia Mother    Cancer  Mother 59       breast    Depression Brother    Alcohol abuse Brother    Colon polyps Father    Depression Father    Irritable bowel syndrome Father    Alcohol abuse Father    Colon cancer Paternal Aunt 59   Heart attack Paternal Grandfather    Kidney cancer Paternal Grandfather    Cancer Maternal Grandfather        unknown type     Social History   Occupational History   Occupation: Unemployed Therapist, sports   Occupation: Disabled    Fish farm manager: UNEMPLOYED  Tobacco Use   Smoking status: Former Smoker    Packs/day: 1.00    Years: 5.00    Pack years: 5.00    Types: Cigarettes    Start date: 1994    Quit date: 08/26/1997    Years since quitting: 21.3   Smokeless tobacco: Never Used  Substance and Sexual Activity   Alcohol use: No    Alcohol/week: 0.0 standard drinks   Drug use: No   Sexual activity: Yes    Partners: Male    Birth control/protection: Surgical, Other-see comments    Allergies  Allergen Reactions   Ciprofloxacin Other (See Comments)    Nerve pain  Nerve pain  Nerve pain    Duloxetine Hcl Anaphylaxis    Make her more depressed, having nausea, vomiting, headache and threw up. Make her more depressed, having nausea, vomiting, headache and threw up.   Lyrica [Pregabalin] Anaphylaxis   Feraheme [Ferumoxytol] Hives   Penicillins     Has patient had a PCN reaction causing immediate rash, facial/tongue/throat swelling, SOB or lightheadedness with hypotension: Yes Has patient had a PCN reaction causing severe rash involving mucus membranes or skin necrosis: No Has patient had a PCN reaction that required hospitalization No Has patient had a PCN reaction occurring within the last 10 years: No If all of the above answers are "NO", then may proceed with Cephalosporin use.     REACTION: Rash   Vit B12-Methionine-Inos-Chol      Outpatient Medications Prior to Visit  Medication Sig Dispense Refill   albuterol (VENTOLIN HFA) 108 (90 Base) MCG/ACT  inhaler Inhale 2 puffs into the lungs every 6 (six) hours as needed for wheezing or shortness of breath. 8 g 6   buPROPion (WELLBUTRIN) 75 MG tablet Take 2 tab in AM 60 tablet 2   clonazePAM (KLONOPIN) 0.5 MG tablet Take 1 tablet (0.5 mg total) by mouth 2 (two) times daily as needed for anxiety. 60 tablet 1   Cyanocobalamin (B-12) 1000 MCG SUBL Place 1,000 mcg under the tongue daily. 30 each 6   fluconazole (DIFLUCAN) 150 MG tablet Take one tablet once daily as needed for yeast infection 2 tablet 1   gabapentin (NEURONTIN) 600 MG tablet Take 600 mg by mouth 3 (three) times daily.     HYDROcodone-acetaminophen (NORCO) 7.5-325 MG tablet Take 1  tablet by mouth every 6 (six) hours as needed for moderate pain.     metoprolol tartrate (LOPRESSOR) 25 MG tablet Take 0.5 tablets (12.5 mg total) by mouth 2 (two) times daily as needed. 180 tablet 3   Multiple Vitamin (MULTIVITAMIN WITH MINERALS) TABS tablet Take 3 tablets by mouth 2 (two) times daily.      naloxone (NARCAN) nasal spray 4 mg/0.1 mL Place into the nose as needed.      ondansetron (ZOFRAN) 4 MG tablet Take 1 tablet (4 mg total) by mouth every 8 (eight) hours as needed for nausea or vomiting. 20 tablet 0   PREMARIN vaginal cream 2 times a week     temazepam (RESTORIL) 22.5 MG capsule Take 1 capsule (22.5 mg total) by mouth at bedtime. 30 capsule 2   tiZANidine (ZANAFLEX) 4 MG tablet Take 4 mg by mouth 3 (three) times daily as needed for muscle spasms.     topiramate (TOPAMAX) 50 MG tablet Take 1 tablet (50 mg total) by mouth daily. 30 tablet 1   ziprasidone (GEODON) 60 MG capsule Take 1 capsule (60 mg total) by mouth at bedtime. 30 capsule 2   budesonide-formoterol (SYMBICORT) 80-4.5 MCG/ACT inhaler Inhale 2 puffs into the lungs 2 (two) times daily. 1 Inhaler 0   No facility-administered medications prior to visit.     Review of Systems  Constitutional: Negative for chills, fever, malaise/fatigue and weight loss.  HENT:  Negative for congestion and sinus pain.   Respiratory: Positive for cough and shortness of breath. Negative for sputum production and wheezing.   Cardiovascular: Positive for chest pain and palpitations. Negative for leg swelling.  Gastrointestinal: Negative for heartburn and nausea.  Skin: Negative for rash.  Endo/Heme/Allergies: Positive for environmental allergies.     Objective:   Vitals:   01/04/19 0858  BP: 108/60  Pulse: 89  Temp: (!) 97 F (36.1 C)  TempSrc: Temporal  SpO2: 99%  Weight: 145 lb (65.8 kg)  Height: 5' 5.5" (1.664 m)   SpO2: 99 % O2 Device: None (Room air)   Physical Exam: General: Well-appearing, no acute distress HENT: [place], AT, OP clear, MMM Eyes: EOMI, no scleral icterus Respiratory: Clear to auscultation bilaterally.  No crackles, wheezing or rales Cardiovascular: RRR, -M/R/G, no JVD GI: BS+, soft, nontender Extremities:-Edema,-tenderness Neuro: AAO x4, CNII-XII grossly intact Skin: Intact, no rashes or bruising Psych: Normal mood, normal affect  Data Reviewed:  Imaging: CXR 08/04/2017-no acute cardiopulmonary abnormalities.  Mild hyperinflation without flattening of the diaphragms CXR 12/09/2016-mild hyperinflation.  No infiltrates, effusion or edema.  PFT: None on file  Labs: CBC and CMP 11/02/2018-unremarkable.  Eosinophils 100  Imaging, labs and tests noted above have been reviewed independently by me.    Assessment & Plan:  Irritant-induced asthma  Discussion: 43 year old female who presents with atypical symptoms that may represent reactive airway dysfunction or irritant induced asthma. Trial of low-dose ICS/LABA improved symptoms. She may need higher ICS dose to resolve issues. Rast panel negative.  Did not complete PFTs due to concern about safety/sterility of testing.    Medications:  Provide sample and PRESCRIBE Symbicort 80-4.5 mcg 2 puffs twice a day   CONTINUE Albuterol 2 puffs as needed every 4-6 hours for shortness of breath  or wheezing. THIS IS RESCUE INHALER   Asthma Action Plan:  Take Albuterol as needed for shortness of breath. If symptoms persistent including coughing, shortness of breath or wheezing, please call office to consider short-course of steroids.   Other recommendations:  Minimize  exposures  Continue physical therapy at home  Health Maintenance Pneumonia-not indicated Influenza 01/13/2018 CT Lung Screen-not indicated  Orders Placed This Encounter  Procedures   Flu Vaccine QUAD 36+ mos IM   Meds ordered this encounter  Medications   budesonide-formoterol (SYMBICORT) 80-4.5 MCG/ACT inhaler    Sig: Inhale 2 puffs into the lungs 2 (two) times daily.    Dispense:  1 Inhaler    Refill:  6   budesonide-formoterol (SYMBICORT) 80-4.5 MCG/ACT inhaler    Sig: Inhale 2 puffs into the lungs 2 (two) times daily.    Dispense:  1 Inhaler    Refill:  0    Order Specific Question:   Lot Number?    AnswerLF:[id number]    Order Specific Question:   Expiration Date?    Answer:   11/27/2019    Order Specific Question:   Manufacturer?    Answer:   [employer] [[id number]]    Order Specific Question:   Quantity    Answer:   1   Return in about 3 months (around 04/05/2019). Via Televideo visit  Greater than 50% of this patient 25-minute office visit was spent face-to-face in counseling with the patient/family. We discussed medical diagnosis and treatment plan as noted.  [place], MD [facility] 01/04/2019 1:21 PM  Office Number [phone]

## 2019-01-05 ENCOUNTER — Telehealth: Payer: Self-pay | Admitting: Pulmonary Disease

## 2019-01-05 MED ORDER — BUDESONIDE-FORMOTEROL FUMARATE 80-4.5 MCG/ACT IN AERO: 2.0000 | INHALATION_SPRAY | Freq: Two times a day (BID) | RESPIRATORY_TRACT | 5 refills | Status: DC

## 2019-01-05 NOTE — Telephone Encounter (Signed)
Called and spoke with Patient.  Patient stated st her OV yesterday, Dr Loanne Drilling was going to send a new prescription to [facility].  Patient stated it was never received. New Prescription sent to requested pharmacy. Nothing further at this time.

## 2019-01-07 DIAGNOSIS — G608 Other hereditary and idiopathic neuropathies: Secondary | ICD-10-CM | POA: Diagnosis not present

## 2019-01-11 ENCOUNTER — Telehealth: Payer: Self-pay | Admitting: *Deleted

## 2019-01-11 ENCOUNTER — Other Ambulatory Visit: Payer: Self-pay

## 2019-01-11 ENCOUNTER — Encounter: Payer: Self-pay | Admitting: Family Medicine

## 2019-01-11 ENCOUNTER — Ambulatory Visit (INDEPENDENT_AMBULATORY_CARE_PROVIDER_SITE_OTHER): Payer: PPO | Admitting: Family Medicine

## 2019-01-11 ENCOUNTER — Other Ambulatory Visit (HOSPITAL_COMMUNITY): Payer: Self-pay

## 2019-01-11 VITALS — BP 96/68 | Ht 65.5 in | Wt 143.0 lb

## 2019-01-11 DIAGNOSIS — G43109 Migraine with aura, not intractable, without status migrainosus: Secondary | ICD-10-CM | POA: Diagnosis not present

## 2019-01-11 DIAGNOSIS — Z9109 Other allergy status, other than to drugs and biological substances: Secondary | ICD-10-CM

## 2019-01-11 DIAGNOSIS — Z09 Encounter for follow-up examination after completed treatment for conditions other than malignant neoplasm: Secondary | ICD-10-CM

## 2019-01-11 DIAGNOSIS — E559 Vitamin D deficiency, unspecified: Secondary | ICD-10-CM

## 2019-01-11 DIAGNOSIS — Z9884 Bariatric surgery status: Secondary | ICD-10-CM

## 2019-01-11 DIAGNOSIS — J45909 Unspecified asthma, uncomplicated: Secondary | ICD-10-CM | POA: Diagnosis not present

## 2019-01-11 DIAGNOSIS — F509 Eating disorder, unspecified: Secondary | ICD-10-CM | POA: Diagnosis not present

## 2019-01-11 DIAGNOSIS — D500 Iron deficiency anemia secondary to blood loss (chronic): Secondary | ICD-10-CM

## 2019-01-11 DIAGNOSIS — G608 Other hereditary and idiopathic neuropathies: Secondary | ICD-10-CM | POA: Diagnosis not present

## 2019-01-11 DIAGNOSIS — R7301 Impaired fasting glucose: Secondary | ICD-10-CM

## 2019-01-11 DIAGNOSIS — D508 Other iron deficiency anemias: Secondary | ICD-10-CM | POA: Diagnosis not present

## 2019-01-11 DIAGNOSIS — Z13220 Encounter for screening for lipoid disorders: Secondary | ICD-10-CM

## 2019-01-11 DIAGNOSIS — F333 Major depressive disorder, recurrent, severe with psychotic symptoms: Secondary | ICD-10-CM | POA: Diagnosis not present

## 2019-01-11 MED ORDER — ONDANSETRON HCL 4 MG PO TABS: 4.0000 mg | ORAL_TABLET | Freq: Three times a day (TID) | ORAL | 1 refills | Status: DC | PRN

## 2019-01-11 MED ORDER — TOPIRAMATE 50 MG PO TABS: 50.0000 mg | ORAL_TABLET | Freq: Two times a day (BID) | ORAL | 5 refills | Status: DC

## 2019-01-11 NOTE — Assessment & Plan Note (Signed)
Followed by [facility], and chronic pain management through pain clinic, still c/o chronic uncontrolled pain

## 2019-01-11 NOTE — Assessment & Plan Note (Signed)
Has been evaluated by pulmonary and reports improvement in wheezing with symbicort

## 2019-01-11 NOTE — Assessment & Plan Note (Signed)
Followed by hematology, has upcoming labs in am

## 2019-01-11 NOTE — Telephone Encounter (Signed)
Ok to order 

## 2019-01-11 NOTE — Assessment & Plan Note (Signed)
Controlled and stable, psychiatry treats

## 2019-01-11 NOTE — Assessment & Plan Note (Signed)
Controlled on current medication, continue same 

## 2019-01-11 NOTE — Progress Notes (Signed)
Virtual Visit via Telephone Note  I connected with Katelyn [name] on 01/11/19 at  9:20 AM EDT by telephone and verified that I am speaking with the correct person using two identifiers.  Location: Patient: home Provider: office   I discussed the limitations, risks, security and privacy concerns of performing an evaluation and management service by telephone and the availability of in person appointments. I also discussed with the patient that there may be a patient responsible charge related to this service. The patient expressed understanding and agreed to proceed.  understanding and agreed to proceed.           History of Present Illness: Generally feels very weak and in a lot of pain, was at [facility] yesterday. [facility] has suggested home PT home physical therapy for neck and back pain. and seeing pelvic floor therapist virtually x 1 month  Has small fiber neuropathy Started palpitations with PVC in past month has resumed metoprolol recently, has been told could be due to stress and pain,  [facility] thinks neuropathy may contribute to palpitation Also has been told that neuropathy  may also contribute to the pulmonary symptoms, does report some improvement with symbicort. Question if IVIG may benefit her neuropathy but s/e and cost are barriers at this  Time Bariatric surgeon has retired, question re follow up.Surgery was in 2015, states she was told needed dexa 3 years post surgery and has b een having annual exams with labs , this has been delayed and now needs new Provider for f/u will help with this Has rept iron studies in am through hematology Upcoming gyne and mammogram next week Eating disorder therapy every 2 weeks and nutritionist weekly in past 6 months, has gained 10 pounds in last year and goal is for an additional 10 pounds  Observations/Objective: BP 96/68   Ht 5' 5.5" (1.664 m)   Wt 143 lb (64.9 kg)   BMI 23.43 kg/m   Good communication with no confusion and intact  memory. Alert and oriented x 3 No signs of respiratory distress during speech    Assessment and Plan:  Multiple environmental allergies Has been evaluated by pulmonary and reports improvement in wheezing with symbicort  Asthma, exogenous, unspecified asthma severity, uncomplicated Improvement on symbicort through Pulmonary reported at visit today  Bariatric surgery status Had surgery in 2015, requesting referral to new surgeon for annual follow up as her surgeon Dr Excell Seltzer has retired. She is requesting Dr Hassell Done if possible and I have entered the referral Labs are added on for draw in am at the hospital  Eating disorder Being followed closely by therapy with 10 pound weight gain, will continue same  Idiopathic small fiber sensory neuropathy Followed by [facility], and chronic pain management through pain clinic, still c/o chronic uncontrolled pain  Iron deficiency anemia Followed by hematology, has upcoming labs in am  Depression, major, recurrent, severe with psychosis Controlled and stable, psychiatry treats  Migraine with aura and without status migrainosus, not intractable Controlled on current medication , continue same   Follow Up Instructions:    I discussed the assessment and treatment plan with the patient. The patient was provided an opportunity to ask questions and all were answered. The patient agreed with the plan and demonstrated an understanding of the instructions.   The patient was advised to call back or seek an in-person evaluation if the symptoms worsen or if the condition fails to improve as anticipated.  I provided 25 minutes of non-face-to-face time during this encounter.  [name], MD

## 2019-01-11 NOTE — Telephone Encounter (Signed)
Pt called said Dr Moshe Cipro was ordering blood work for her to have done tomorrow. She wanted to know if Dr Moshe Cipro could order a hemoglobn A1c to add to the blood work as she has been having low blood sugars.

## 2019-01-11 NOTE — Telephone Encounter (Signed)
A1C added to order

## 2019-01-11 NOTE — Patient Instructions (Addendum)
F/U with MD phone visit in 4 months, call if you need me before  Fasting lipid, TSH and Vit D at hospital in am, add HBA1C   6 months of topamax and zofran are prescribed  You are referred for f/u at [facility] surgery ,for bariatric surgery , preferred is Dr Hassell Done per your request  All the best with physical therapy and your nutrtion support  Thanks for choosing [facility], we consider it a privelige to serve you.

## 2019-01-11 NOTE — Assessment & Plan Note (Signed)
Had surgery in 2015, requesting referral to new surgeon for annual follow up as her surgeon Dr Excell Seltzer has retired. She is requesting Dr Hassell Done if possible and I have entered the referral Labs are added on for draw in am at the hospital

## 2019-01-11 NOTE — Assessment & Plan Note (Signed)
Being followed closely by therapy with 10 pound weight gain, will continue same

## 2019-01-11 NOTE — Assessment & Plan Note (Signed)
Improvement on symbicort through Pulmonary reported at visit today

## 2019-01-11 NOTE — Telephone Encounter (Signed)
Yes you may order

## 2019-01-12 ENCOUNTER — Other Ambulatory Visit: Payer: Self-pay

## 2019-01-12 ENCOUNTER — Other Ambulatory Visit (HOSPITAL_COMMUNITY)
Admission: RE | Admit: 2019-01-12 | Discharge: 2019-01-12 | Disposition: A | Discharge status: Home / Self Care | Payer: PPO | Source: Ambulatory Visit | Attending: Family Medicine | Admitting: Family Medicine

## 2019-01-12 ENCOUNTER — Encounter: Payer: Self-pay | Admitting: Family Medicine

## 2019-01-12 ENCOUNTER — Inpatient Hospital Stay (HOSPITAL_COMMUNITY): Payer: PPO | Attending: Hematology

## 2019-01-12 ENCOUNTER — Other Ambulatory Visit (HOSPITAL_COMMUNITY): Payer: PPO

## 2019-01-12 DIAGNOSIS — E559 Vitamin D deficiency, unspecified: Secondary | ICD-10-CM | POA: Diagnosis not present

## 2019-01-12 DIAGNOSIS — Z13220 Encounter for screening for lipoid disorders: Secondary | ICD-10-CM | POA: Insufficient documentation

## 2019-01-12 DIAGNOSIS — Z79899 Other long term (current) drug therapy: Secondary | ICD-10-CM | POA: Insufficient documentation

## 2019-01-12 DIAGNOSIS — Z9884 Bariatric surgery status: Secondary | ICD-10-CM | POA: Insufficient documentation

## 2019-01-12 DIAGNOSIS — D509 Iron deficiency anemia, unspecified: Secondary | ICD-10-CM | POA: Insufficient documentation

## 2019-01-12 DIAGNOSIS — R5383 Other fatigue: Secondary | ICD-10-CM | POA: Insufficient documentation

## 2019-01-12 DIAGNOSIS — D500 Iron deficiency anemia secondary to blood loss (chronic): Secondary | ICD-10-CM

## 2019-01-12 DIAGNOSIS — R7301 Impaired fasting glucose: Secondary | ICD-10-CM | POA: Diagnosis not present

## 2019-01-12 LAB — CBC WITH DIFFERENTIAL/PLATELET
Abs Immature Granulocytes: 0.02 10*3/uL (ref 0.00–0.07)
Basophils Absolute: 0 10*3/uL (ref 0.0–0.1)
Basophils Relative: 0 %
Eosinophils Absolute: 0.1 10*3/uL (ref 0.0–0.5)
Eosinophils Relative: 2 %
HCT: 42.1 % (ref 36.0–46.0)
Hemoglobin: 13.5 g/dL (ref 12.0–15.0)
Immature Granulocytes: 0 %
Lymphocytes Relative: 31 %
Lymphs Abs: 1.8 10*3/uL (ref 0.7–4.0)
MCH: 32.8 pg (ref 26.0–34.0)
MCHC: 32.1 g/dL (ref 30.0–36.0)
MCV: 102.2 fL — ABNORMAL HIGH (ref 80.0–100.0)
Monocytes Absolute: 0.3 10*3/uL (ref 0.1–1.0)
Monocytes Relative: 5 %
Neutro Abs: 3.7 10*3/uL (ref 1.7–7.7)
Neutrophils Relative %: 62 %
Platelets: 315 10*3/uL (ref 150–400)
RBC: 4.12 MIL/uL (ref 3.87–5.11)
RDW: 12.3 % (ref 11.5–15.5)
WBC: 6 10*3/uL (ref 4.0–10.5)
nRBC: 0 % (ref 0.0–0.2)

## 2019-01-12 LAB — COMPREHENSIVE METABOLIC PANEL
ALT: 40 U/L (ref 0–44)
AST: 39 U/L (ref 15–41)
Albumin: 4.9 g/dL (ref 3.5–5.0)
Alkaline Phosphatase: 62 U/L (ref 38–126)
Anion gap: 10 (ref 5–15)
BUN: 14 mg/dL (ref 6–20)
CO2: 24 mmol/L (ref 22–32)
Calcium: 8.8 mg/dL — ABNORMAL LOW (ref 8.9–10.3)
Chloride: 103 mmol/L (ref 98–111)
Creatinine, Ser: 0.71 mg/dL (ref 0.44–1.00)
GFR calc Af Amer: >60 mL/min (ref >60)
GFR calc non Af Amer: >60 mL/min (ref >60)
Glucose, Bld: 82 mg/dL (ref 70–99)
Potassium: 3.5 mmol/L (ref 3.5–5.1)
Sodium: 137 mmol/L (ref 135–145)
Total Bilirubin: 1 mg/dL (ref 0.3–1.2)
Total Protein: 7.3 g/dL (ref 6.5–8.1)

## 2019-01-12 LAB — LIPID PANEL
Cholesterol: 140 mg/dL (ref 0–200)
HDL: 65 mg/dL (ref >40)
LDL Cholesterol: 67 mg/dL (ref 0–99)
Total CHOL/HDL Ratio: 2.2 RATIO
Triglycerides: 39 mg/dL (ref <150)
VLDL: 8 mg/dL (ref 0–40)

## 2019-01-12 LAB — LACTATE DEHYDROGENASE: LDH: 286 U/L — ABNORMAL HIGH (ref 98–192)

## 2019-01-12 LAB — FERRITIN: Ferritin: 49 ng/mL (ref 11–307)

## 2019-01-12 LAB — TSH: TSH: 1.674 u[IU]/mL (ref 0.350–4.500)

## 2019-01-13 ENCOUNTER — Encounter: Payer: Self-pay | Admitting: Family Medicine

## 2019-01-13 LAB — HEMOGLOBIN A1C
Hgb A1c MFr Bld: 4.8 % (ref 4.8–5.6)
Mean Plasma Glucose: 91 mg/dL

## 2019-01-13 LAB — VITAMIN D 25 HYDROXY (VIT D DEFICIENCY, FRACTURES): Vit D, 25-Hydroxy: 42.1 ng/mL (ref 30.0–100.0)

## 2019-01-17 DIAGNOSIS — Z1231 Encounter for screening mammogram for malignant neoplasm of breast: Secondary | ICD-10-CM | POA: Diagnosis not present

## 2019-01-17 DIAGNOSIS — N760 Acute vaginitis: Secondary | ICD-10-CM | POA: Diagnosis not present

## 2019-01-17 DIAGNOSIS — Z124 Encounter for screening for malignant neoplasm of cervix: Secondary | ICD-10-CM | POA: Diagnosis not present

## 2019-01-17 DIAGNOSIS — N920 Excessive and frequent menstruation with regular cycle: Secondary | ICD-10-CM | POA: Diagnosis not present

## 2019-01-17 DIAGNOSIS — Z1151 Encounter for screening for human papillomavirus (HPV): Secondary | ICD-10-CM | POA: Diagnosis not present

## 2019-01-18 ENCOUNTER — Inpatient Hospital Stay (HOSPITAL_BASED_OUTPATIENT_CLINIC_OR_DEPARTMENT_OTHER): Payer: PPO | Admitting: Hematology

## 2019-01-18 DIAGNOSIS — D509 Iron deficiency anemia, unspecified: Secondary | ICD-10-CM | POA: Diagnosis not present

## 2019-01-19 NOTE — Progress Notes (Signed)
I connected with Katelyn [name] by telephone visit and verified that I am speaking with the correct person using two identifiers.   I discussed the limitations, risks, security and privacy concerns of performing an evaluation and management service by telemedicine and the availability of in-person appointments. I also discussed with the patient that there may be a patient responsible charge related to this service. The patient expressed understanding and agreed to proceed.     Patient's location: Home  Provider's location: [place] [facility]    History of Present Illness: Iron deficiency anemia, secondary to malabsorption from gastric bypass surgery    Chief Complaint: Katelyn [name] is interviewed by telephone.  She reports overall doing well.  Her condition is stable.  She continues with reports of chronic fatigue and chronic pain.  She denies any obvious signs of bleeding.  She denies any chest pain shortness of breath, lightheadedness or dizziness.  She continues follow-up with a nutritionist.  She denies any changes in bowel habits.  She continues to gain weight.  She states she also suffers with chronic constipation secondary to long-term use of opioids.  Today we will discuss her most recent labs.  Assessment and Plan: 1.Iron deficiency anemia secondary to malabsorption - S/P gastric bypass and on chronic PPI therapy.  - Continue with IV iron as needed. Had previous allergic reaction to [facility]. Has tolerated 2 doses of Infectafer without complaints. Last dose 03/11/2018 .  -Hemoglobin is stable and within normal at 13.5 g/dL.  Ferritin level gradually continues to trend down.  Due to the amount of patient's fatigue.  We will proceed with 1 infusion of IV Injectafer. -Patient will return to clinic in 3 months for repeat labs and office visit.     I discussed the assessment and treatment plan with the patient. The patient was provided an opportunity to ask questions  and all were answered. The patient agreed with the plan and demonstrated an understanding of the instructions.  The patient was advised to call back or seek an in-person evaluation if the symptoms worsen or if the condition fails to improve as anticipated.  I provided 15 minutes of non-face-to-face time during this encounter.   Roger Shelter, FNP

## 2019-01-20 DIAGNOSIS — F411 Generalized anxiety disorder: Secondary | ICD-10-CM | POA: Diagnosis not present

## 2019-01-20 DIAGNOSIS — F5001 Anorexia nervosa, restricting type: Secondary | ICD-10-CM | POA: Diagnosis not present

## 2019-01-20 DIAGNOSIS — F331 Major depressive disorder, recurrent, moderate: Secondary | ICD-10-CM | POA: Diagnosis not present

## 2019-01-25 DIAGNOSIS — M533 Sacrococcygeal disorders, not elsewhere classified: Secondary | ICD-10-CM | POA: Diagnosis not present

## 2019-01-25 DIAGNOSIS — G8929 Other chronic pain: Secondary | ICD-10-CM | POA: Diagnosis not present

## 2019-01-25 DIAGNOSIS — R102 Pelvic and perineal pain: Secondary | ICD-10-CM | POA: Diagnosis not present

## 2019-01-25 DIAGNOSIS — Z79891 Long term (current) use of opiate analgesic: Secondary | ICD-10-CM | POA: Diagnosis not present

## 2019-01-25 DIAGNOSIS — G629 Polyneuropathy, unspecified: Secondary | ICD-10-CM | POA: Diagnosis not present

## 2019-01-28 ENCOUNTER — Other Ambulatory Visit: Payer: Self-pay

## 2019-01-28 ENCOUNTER — Inpatient Hospital Stay (HOSPITAL_COMMUNITY): Payer: PPO | Attending: Hematology

## 2019-01-28 ENCOUNTER — Encounter (HOSPITAL_COMMUNITY): Payer: Self-pay

## 2019-01-28 VITALS — BP 120/73 | HR 78 | Temp 97.1°F | Resp 18

## 2019-01-28 DIAGNOSIS — Z9884 Bariatric surgery status: Secondary | ICD-10-CM | POA: Insufficient documentation

## 2019-01-28 DIAGNOSIS — Z79899 Other long term (current) drug therapy: Secondary | ICD-10-CM | POA: Insufficient documentation

## 2019-01-28 DIAGNOSIS — D509 Iron deficiency anemia, unspecified: Secondary | ICD-10-CM | POA: Diagnosis not present

## 2019-01-28 DIAGNOSIS — R5383 Other fatigue: Secondary | ICD-10-CM | POA: Insufficient documentation

## 2019-01-28 DIAGNOSIS — D513 Other dietary vitamin B12 deficiency anemia: Secondary | ICD-10-CM

## 2019-01-28 MED ORDER — CYANOCOBALAMIN 1000 MCG/ML IJ SOLN: 1000.0000 ug | Freq: Once | INTRAMUSCULAR | Status: DC

## 2019-01-28 MED ORDER — SODIUM CHLORIDE 0.9 % IV SOLN
Freq: Once | INTRAVENOUS | Status: AC
  Administered 2019-01-28: 09:00:00 via INTRAVENOUS

## 2019-01-28 MED ORDER — SODIUM CHLORIDE 0.9 % IV SOLN
750.0000 mg | Freq: Once | INTRAVENOUS | Status: AC
  Administered 2019-01-28: 750 mg via INTRAVENOUS
  Filled 2019-01-28: qty 15

## 2019-01-28 NOTE — Progress Notes (Signed)
Injectafer given per orders. Patient tolerated it well without problems. Vitals stable and discharged home from clinic ambulatory. Follow up as scheduled.  

## 2019-01-28 NOTE — Patient Instructions (Signed)
[facility]  Discharge Instructions:   _______________________________________________________________  Thank you for choosing [facility] to provide your oncology and hematology care.  To afford each patient quality time with our providers, please arrive at least 15 minutes before your scheduled appointment.  You need to re-schedule your appointment if you arrive 10 or more minutes late.  We strive to give you quality time with our providers, and arriving late affects you and other patients whose appointments are after yours.  Also, if you no show three or more times for appointments you may be dismissed from the clinic.  Again, thank you for choosing  [facility]. Our hope is that these requests will allow you access to exceptional care and in a timely manner. _______________________________________________________________  If you have questions after your visit, please contact our office at [phone] between the hours of 8:30 a.m. and 5:00 p.m. Voicemails left after 4:30 p.m. will not be returned until the following business day. _______________________________________________________________  For prescription refill requests, have your pharmacy contact our office. _______________________________________________________________  Recommendations made by the consultant and any test results will be sent to your referring physician. _______________________________________________________________ 

## 2019-01-31 ENCOUNTER — Ambulatory Visit (INDEPENDENT_AMBULATORY_CARE_PROVIDER_SITE_OTHER): Payer: PPO | Admitting: Family Medicine

## 2019-01-31 ENCOUNTER — Telehealth (HOSPITAL_COMMUNITY): Payer: Self-pay | Admitting: *Deleted

## 2019-01-31 ENCOUNTER — Other Ambulatory Visit: Payer: Self-pay

## 2019-01-31 VITALS — BP 106/70 | Temp 101.7°F | Ht 65.5 in | Wt 144.0 lb

## 2019-01-31 DIAGNOSIS — R5383 Other fatigue: Secondary | ICD-10-CM

## 2019-01-31 DIAGNOSIS — R509 Fever, unspecified: Secondary | ICD-10-CM | POA: Diagnosis not present

## 2019-01-31 NOTE — Patient Instructions (Signed)
F/U as before, call if you need me before  Please get Covid 19 test today, nurse will call with hours of operation for Covid testing  Tylenol , fluids, many small meals and rest please

## 2019-01-31 NOTE — Progress Notes (Signed)
Virtual Visit via Telephone Note  I connected with Katelyn [name] on 01/31/19 at 10:20 AM EDT by telephone and verified that I am speaking with the correct person using two identifiers.  Location: Patient: home Provider: office   I discussed the limitations, risks, security and privacy concerns of performing an evaluation and management service by telephone and the availability of in person appointments. I also discussed with the patient that there may be a patient responsible charge related to this service. The patient expressed understanding and agreed to proceed.   History of Present Illness: States she had IV iron treatment at hospital last Friday, temp up to 101 started last pm, tylenol every 4 hrs, then bounced back , to 101.4, this am  101.6. Denies UTI sym[ptoms, treated for bV with 2 day course. Nausea, poor appetite, runny nose   Heart rate up to 123 with fever.  Denies sinus pressure,  ear pain or sore throat. Denies chest pains, palpitations and leg swelling      Observations/Objective: BP 106/70   Temp (!) 101.7 F (38.7 C) (Oral)   Ht 5' 5.5" (1.664 m)   Wt 144 lb (65.3 kg)   SpO2 98%   BMI 23.60 kg/m  Good communication with no confusion and intact memory. Alert and oriented x 3 No signs of respiratory distress during speech    Assessment and Plan: Fever Reports acute onset of fever and fatigue, recommend covid testing and self isolation until report avalble, supportive management at home  Fatigue Acute fatigue with fever, supportive management, and covid testing and self isolation     Follow Up Instructions:    I discussed the assessment and treatment plan with the patient. The patient was provided an opportunity to ask questions and all were answered. The patient agreed with the plan and demonstrated an understanding of the instructions.   The patient was advised to call back or seek an in-person evaluation if the symptoms worsen or if  the condition fails to improve as anticipated.  I provided 12 minutes of non-face-to-face time during this encounter.   [name], MD

## 2019-01-31 NOTE — Telephone Encounter (Signed)
Pt called into clinic stated she's having symptoms of a fever of 101.7, chills, slight shortness of breath, and nausea. Pt received an iron infusion Friday.Pt stated her symptoms started Sunday. Pt wanted to know did these symptoms have anything to do with the iron infusion she received Friday. I asked provider and provider stated the symptoms wasn't due to the iron infusion from Friday. Provider advised  the pt go to see PCP or go to the ER. Called pt to let her know what the provider recommended and pt verbalized understanding.

## 2019-02-01 ENCOUNTER — Telehealth: Payer: Self-pay | Admitting: *Deleted

## 2019-02-01 ENCOUNTER — Other Ambulatory Visit: Payer: Self-pay | Admitting: *Deleted

## 2019-02-01 ENCOUNTER — Encounter: Payer: Self-pay | Admitting: Family Medicine

## 2019-02-01 DIAGNOSIS — Z20828 Contact with and (suspected) exposure to other viral communicable diseases: Secondary | ICD-10-CM | POA: Diagnosis not present

## 2019-02-01 DIAGNOSIS — Z20822 Contact with and (suspected) exposure to covid-19: Secondary | ICD-10-CM

## 2019-02-01 NOTE — Telephone Encounter (Signed)
Pt called said she was told to go get a covid test done and they were closed yesterday. Said she woke up this morning and her fever has been fine all night no fever and no respiratory symptoms. Wants to know if she can just not go get the covid test done?

## 2019-02-01 NOTE — Telephone Encounter (Signed)
Spoke with pt and she ended up getting tested anyways to be on the safe side

## 2019-02-01 NOTE — Telephone Encounter (Signed)
Noted and glad she got it

## 2019-02-01 NOTE — Assessment & Plan Note (Signed)
Acute fatigue with fever, supportive management, and covid testing and self isolation

## 2019-02-01 NOTE — Assessment & Plan Note (Signed)
Reports acute onset of fever and fatigue, recommend covid testing and self isolation until report avalble, supportive management at home

## 2019-02-01 NOTE — Telephone Encounter (Signed)
Do you think this is still needed per pt?

## 2019-02-03 DIAGNOSIS — R208 Other disturbances of skin sensation: Secondary | ICD-10-CM | POA: Diagnosis not present

## 2019-02-03 DIAGNOSIS — M62838 Other muscle spasm: Secondary | ICD-10-CM | POA: Diagnosis not present

## 2019-02-03 DIAGNOSIS — M6281 Muscle weakness (generalized): Secondary | ICD-10-CM | POA: Diagnosis not present

## 2019-02-03 DIAGNOSIS — R3911 Hesitancy of micturition: Secondary | ICD-10-CM | POA: Diagnosis not present

## 2019-02-03 DIAGNOSIS — N3943 Post-void dribbling: Secondary | ICD-10-CM | POA: Diagnosis not present

## 2019-02-03 DIAGNOSIS — M545 Low back pain: Secondary | ICD-10-CM | POA: Diagnosis not present

## 2019-02-03 LAB — NOVEL CORONAVIRUS, NAA: SARS-CoV-2, NAA: NOT DETECTED

## 2019-02-10 DIAGNOSIS — R3911 Hesitancy of micturition: Secondary | ICD-10-CM | POA: Diagnosis not present

## 2019-02-10 DIAGNOSIS — M62838 Other muscle spasm: Secondary | ICD-10-CM | POA: Diagnosis not present

## 2019-02-10 DIAGNOSIS — N3943 Post-void dribbling: Secondary | ICD-10-CM | POA: Diagnosis not present

## 2019-02-10 DIAGNOSIS — M545 Low back pain: Secondary | ICD-10-CM | POA: Diagnosis not present

## 2019-02-10 DIAGNOSIS — R208 Other disturbances of skin sensation: Secondary | ICD-10-CM | POA: Diagnosis not present

## 2019-02-10 DIAGNOSIS — M6281 Muscle weakness (generalized): Secondary | ICD-10-CM | POA: Diagnosis not present

## 2019-02-16 DIAGNOSIS — N3943 Post-void dribbling: Secondary | ICD-10-CM | POA: Diagnosis not present

## 2019-02-16 DIAGNOSIS — M62838 Other muscle spasm: Secondary | ICD-10-CM | POA: Diagnosis not present

## 2019-02-16 DIAGNOSIS — R208 Other disturbances of skin sensation: Secondary | ICD-10-CM | POA: Diagnosis not present

## 2019-02-16 DIAGNOSIS — M6281 Muscle weakness (generalized): Secondary | ICD-10-CM | POA: Diagnosis not present

## 2019-02-16 DIAGNOSIS — R3911 Hesitancy of micturition: Secondary | ICD-10-CM | POA: Diagnosis not present

## 2019-02-16 DIAGNOSIS — M545 Low back pain: Secondary | ICD-10-CM | POA: Diagnosis not present

## 2019-02-17 DIAGNOSIS — F331 Major depressive disorder, recurrent, moderate: Secondary | ICD-10-CM | POA: Diagnosis not present

## 2019-02-17 DIAGNOSIS — F5001 Anorexia nervosa, restricting type: Secondary | ICD-10-CM | POA: Diagnosis not present

## 2019-02-17 DIAGNOSIS — F411 Generalized anxiety disorder: Secondary | ICD-10-CM | POA: Diagnosis not present

## 2019-02-23 DIAGNOSIS — M62838 Other muscle spasm: Secondary | ICD-10-CM | POA: Diagnosis not present

## 2019-02-23 DIAGNOSIS — M545 Low back pain: Secondary | ICD-10-CM | POA: Diagnosis not present

## 2019-02-23 DIAGNOSIS — M6281 Muscle weakness (generalized): Secondary | ICD-10-CM | POA: Diagnosis not present

## 2019-02-23 DIAGNOSIS — N3943 Post-void dribbling: Secondary | ICD-10-CM | POA: Diagnosis not present

## 2019-02-23 DIAGNOSIS — R3911 Hesitancy of micturition: Secondary | ICD-10-CM | POA: Diagnosis not present

## 2019-02-23 DIAGNOSIS — R208 Other disturbances of skin sensation: Secondary | ICD-10-CM | POA: Diagnosis not present

## 2019-03-03 DIAGNOSIS — F5001 Anorexia nervosa, restricting type: Secondary | ICD-10-CM | POA: Diagnosis not present

## 2019-03-03 DIAGNOSIS — F411 Generalized anxiety disorder: Secondary | ICD-10-CM | POA: Diagnosis not present

## 2019-03-03 DIAGNOSIS — F331 Major depressive disorder, recurrent, moderate: Secondary | ICD-10-CM | POA: Diagnosis not present

## 2019-03-09 NOTE — Telephone Encounter (Signed)
Beth- please see pt email and advise recs thanks!   Deatra Ina sent to [facility]  Phone Number: [phone]        [place]. My husband has been very sick since Saturday. He got tested for covid Sunday and we found out he is positive. He has been quarantined in a bedroom since Sunday. But I'm still having to take care of him by taking him his things and washing his dishes. I wear a mask and so does he. He started steroids on Tuesday. I'm concerned about getting it and what to do if I start with symptoms. I'm tired decreased appetite and nauseous and have lost 3lbs since the weekend. No fever. Last time I took steroids I got very sick with abdominal pain and vomiting and lost 10lbs in a week. Bariatric surgeon says be careful with steroid use. Due to my many health problems and how weak I am I'm very concerned about this virus in the house and what treatment is available for me if I become symptomatic. His doctor says I should not get tested before Friday and only if I have symptoms. What should I do at this point and should I message back if I start with symptoms.

## 2019-03-09 NOTE — Telephone Encounter (Signed)
Coronavirus (COVID-19) Are you at risk? ° °Are you at risk for the Coronavirus (COVID-19)? ° °To be considered HIGH RISK for Coronavirus (COVID-19), you have to meet the following criteria: ° °Traveled to [place], [place], [place], [place] or [place]; or in the [place] to [place], [place], [place], or [place]; and have fever, cough, and shortness of breath within the last 2 weeks of travel OR °Been in close contact with a person diagnosed with COVID-19 within the last 2 weeks and have fever, cough, and shortness of breath °IF YOU DO NOT MEET THESE CRITERIA, YOU ARE CONSIDERED LOW RISK FOR COVID-19. ° °What to do if you are HIGH RISK for COVID-19? ° °If you are having a medical emergency, call 911. °Seek medical care right away. Before you go to a doctor’s office, urgent care or emergency department, call ahead and tell them about your recent travel, contact with someone diagnosed with COVID-19, and your symptoms. You should receive instructions from your physician’s office regarding next steps of care.  °When you arrive at healthcare provider, tell the healthcare staff immediately you have returned from visiting [place], [place], [place], [place] or [place]; or traveled in the [place] to [place], [place], [place], or [place]; in the last two weeks or you have been in close contact with a person diagnosed with COVID-19 in the last 2 weeks.   °Tell the health care staff about your symptoms: fever, cough and shortness of breath. °After you have been seen by a medical provider, you will be either: °Tested for (COVID-19) and discharged home on quarantine except to seek medical care if symptoms worsen, and asked to  °Stay home and avoid contact with others until you get your results (4-5 days)  °Avoid travel on public transportation if possible (such as bus, train, or airplane) or °Sent to the Emergency Department by EMS for evaluation, COVID-19 testing, and possible admission depending on your  condition and test results. ° °What to do if you are LOW RISK for COVID-19? ° °Reduce your risk of any infection by using the same precautions used for avoiding the common cold or flu:  °Wash your hands often with soap and warm water for at least 20 seconds.  If soap and water are not readily available, use an alcohol-based hand sanitizer with at least 60% alcohol.  °If coughing or sneezing, cover your mouth and nose by coughing or sneezing into the elbow areas of your shirt or coat, into a tissue or into your sleeve (not your hands). °Avoid shaking hands with others and consider head nods or verbal greetings only. °Avoid touching your eyes, nose, or mouth with unwashed hands.  °Avoid close contact with people who are sick. °Avoid places or events with large numbers of people in one location, like concerts or sporting events. °Carefully consider travel plans you have or are making. °If you are planning any travel outside or inside the [place], visit the CDC’s Travelers’ Health webpage for the latest health notices. °If you have some symptoms but not all symptoms, continue to monitor at home and seek medical attention if your symptoms worsen. °If you are having a medical emergency, call 911. ° ° °ADDITIONAL HEALTHCARE OPTIONS FOR PATIENTS ° °[facility] / e-Visit: [email or web address] °        °[facility]: [phone] ° °[facility]: [phone]          °         °[facility] 

## 2019-03-09 NOTE — Telephone Encounter (Signed)
I agree she should not get tested until Friday and only if she has symptoms. I do not recommend steroids unless having respiratory symptoms such as chest tightness, wheezing or shortness of breath. There is not much more to do other than use precautions and symptomatic management. Be very alert to your symptoms and get tested if needed. Seek medication care if you develop acute respiratory problems or fever that does not improve with tylenol. Continue to practice strict masking and handwashing. Avoid direct contact with husband until he has been free from symptoms for 72 hours. She can take vit C and zinc over the counter. Stay well hydrated.

## 2019-03-10 ENCOUNTER — Encounter: Payer: Self-pay | Admitting: Family Medicine

## 2019-03-18 DIAGNOSIS — F5001 Anorexia nervosa, restricting type: Secondary | ICD-10-CM | POA: Diagnosis not present

## 2019-03-18 DIAGNOSIS — F411 Generalized anxiety disorder: Secondary | ICD-10-CM | POA: Diagnosis not present

## 2019-03-18 DIAGNOSIS — F331 Major depressive disorder, recurrent, moderate: Secondary | ICD-10-CM | POA: Diagnosis not present

## 2019-03-21 DIAGNOSIS — R3911 Hesitancy of micturition: Secondary | ICD-10-CM | POA: Diagnosis not present

## 2019-03-21 DIAGNOSIS — M62838 Other muscle spasm: Secondary | ICD-10-CM | POA: Diagnosis not present

## 2019-03-21 DIAGNOSIS — M545 Low back pain: Secondary | ICD-10-CM | POA: Diagnosis not present

## 2019-03-21 DIAGNOSIS — M6281 Muscle weakness (generalized): Secondary | ICD-10-CM | POA: Diagnosis not present

## 2019-03-21 DIAGNOSIS — R208 Other disturbances of skin sensation: Secondary | ICD-10-CM | POA: Diagnosis not present

## 2019-03-21 DIAGNOSIS — N3943 Post-void dribbling: Secondary | ICD-10-CM | POA: Diagnosis not present

## 2019-03-28 DIAGNOSIS — R3911 Hesitancy of micturition: Secondary | ICD-10-CM | POA: Diagnosis not present

## 2019-03-28 DIAGNOSIS — N3943 Post-void dribbling: Secondary | ICD-10-CM | POA: Diagnosis not present

## 2019-03-28 DIAGNOSIS — M62838 Other muscle spasm: Secondary | ICD-10-CM | POA: Diagnosis not present

## 2019-03-28 DIAGNOSIS — M6281 Muscle weakness (generalized): Secondary | ICD-10-CM | POA: Diagnosis not present

## 2019-03-28 DIAGNOSIS — R208 Other disturbances of skin sensation: Secondary | ICD-10-CM | POA: Diagnosis not present

## 2019-03-28 DIAGNOSIS — M545 Low back pain: Secondary | ICD-10-CM | POA: Diagnosis not present

## 2019-03-29 ENCOUNTER — Other Ambulatory Visit: Payer: Self-pay

## 2019-03-29 ENCOUNTER — Ambulatory Visit (INDEPENDENT_AMBULATORY_CARE_PROVIDER_SITE_OTHER): Payer: PPO | Admitting: Psychiatry

## 2019-03-29 ENCOUNTER — Encounter (HOSPITAL_COMMUNITY): Payer: Self-pay | Admitting: Psychiatry

## 2019-03-29 DIAGNOSIS — F509 Eating disorder, unspecified: Secondary | ICD-10-CM

## 2019-03-29 DIAGNOSIS — F331 Major depressive disorder, recurrent, moderate: Secondary | ICD-10-CM | POA: Diagnosis not present

## 2019-03-29 DIAGNOSIS — F411 Generalized anxiety disorder: Secondary | ICD-10-CM

## 2019-03-29 MED ORDER — BUPROPION HCL 75 MG PO TABS: ORAL_TABLET | ORAL | 2 refills | Status: DC

## 2019-03-29 MED ORDER — ZIPRASIDONE HCL 60 MG PO CAPS: 60.0000 mg | ORAL_CAPSULE | Freq: Every day | ORAL | 2 refills | Status: DC

## 2019-03-29 MED ORDER — CLONAZEPAM 0.5 MG PO TABS: 0.5000 mg | ORAL_TABLET | Freq: Two times a day (BID) | ORAL | 1 refills | Status: DC | PRN

## 2019-03-29 MED ORDER — TEMAZEPAM 22.5 MG PO CAPS: 22.5000 mg | ORAL_CAPSULE | Freq: Every day | ORAL | 2 refills | Status: DC

## 2019-03-29 NOTE — Progress Notes (Signed)
Virtual Visit via Telephone Note  I connected with Katelyn [name] on 03/29/19 at  8:40 AM EST by telephone and verified that I am speaking with the correct person using two identifiers.   I discussed the limitations, risks, security and privacy concerns of performing an evaluation and management service by telephone and the availability of in person appointments. I also discussed with the patient that there may be a patient responsible charge related to this service. The patient expressed understanding and agreed to proceed.   History of Present Illness: Patient was evaluated by phone session.  She reported very anxious and nervous when find out that her husband has Covid symptoms.  Patient told he was sick for 3 weeks but now he is feeling better.  Patient told he was quarantine and she did not even touch him.  She was very scared that she may develop the symptoms.  She was very anxious and nervous and that time she was having hard time sleeping.  Now she is feeling better.  She is back to her 4 to 5 hours sleep with temazepam.  She is seeing nutritionist and eating disorder therapist.  She also getting iron infusion.  She denies any paranoia, hallucination however continues to have chronic neuropathy with tingling and back pain.  She saw specialist at [facility] who recommended IV immunoglobulin but she is not sure if her insurance will approved.  She denies drinking or using any illegal substances.  She is pleased that her weight is stable.  She denies any purging but sometimes restrict her diet.  She is no longer taking Topamax.  She is hoping to come off from the medication because she had paranoia about taking too many medication.  Patient denies any aggression, violence, self abusive behavior.   Past Psychiatric History:Reviewed. H/Omultiple hospitalization. Last admission February 2015. Good response with ECT but scared to continue maintenance ECT. Tried Cymbalta (suicidal thoughts),  Lexapro, Abilify, lithium, Wellbutrin, Lamictal, Ritalin, Remeron, Risperdal, Neurontin, Vistaril, BuSpar, Prozac and Valium.  Recent Results (from the past 2160 hour(s))  CBC with Differential     Status: Abnormal   Collection Time: 01/12/19  8:39 AM  Result Value Ref Range   WBC 6.0 4.0 - 10.5 K/uL   RBC 4.12 3.87 - 5.11 MIL/uL   Hemoglobin 13.5 12.0 - 15.0 g/dL   HCT 42.1 36.0 - 46.0 %   MCV 102.2 (H) 80.0 - 100.0 fL   MCH 32.8 26.0 - 34.0 pg   MCHC 32.1 30.0 - 36.0 g/dL   RDW 12.3 11.5 - 15.5 %   Platelets 315 150 - 400 K/uL   nRBC 0.0 0.0 - 0.2 %   Neutrophils Relative % 62 %   Neutro Abs 3.7 1.7 - 7.7 K/uL   Lymphocytes Relative 31 %   Lymphs Abs 1.8 0.7 - 4.0 K/uL   Monocytes Relative 5 %   Monocytes Absolute 0.3 0.1 - 1.0 K/uL   Eosinophils Relative 2 %   Eosinophils Absolute 0.1 0.0 - 0.5 K/uL   Basophils Relative 0 %   Basophils Absolute 0.0 0.0 - 0.1 K/uL   Immature Granulocytes 0 %   Abs Immature Granulocytes 0.02 0.00 - 0.07 K/uL    Comment: Performed at [facility], [address] [place], [place] [address]  Comprehensive metabolic panel     Status: Abnormal   Collection Time: 01/12/19  8:39 AM  Result Value Ref Range   Sodium 137 135 - 145 mmol/L   Potassium 3.5 3.5 -  5.1 mmol/L   Chloride 103 98 - 111 mmol/L   CO2 24 22 - 32 mmol/L   Glucose, Bld 82 70 - 99 mg/dL   BUN 14 6 - 20 mg/dL   Creatinine, Ser 0.71 0.44 - 1.00 mg/dL   Calcium 8.8 (L) 8.9 - 10.3 mg/dL   Total Protein 7.3 6.5 - 8.1 g/dL   Albumin 4.9 3.5 - 5.0 g/dL   AST 39 15 - 41 U/L   ALT 40 0 - 44 U/L   Alkaline Phosphatase 62 38 - 126 U/L   Total Bilirubin 1.0 0.3 - 1.2 mg/dL   GFR calc non Af Amer >60 >60 mL/min   GFR calc Af Amer >60 >60 mL/min   Anion gap 10 5 - 15    Comment: Performed at [facility], [address] [place], [place] [address]  Lactate dehydrogenase     Status: Abnormal   Collection Time: 01/12/19  8:39 AM  Result Value Ref Range   LDH 286 (H) 98 - 192 U/L     Comment: Performed at [facility], [address], [place], [place] [address]  Ferritin     Status: None   Collection Time: 01/12/19  8:39 AM  Result Value Ref Range   Ferritin 49 11 - 307 ng/mL    Comment: Performed at [facility], [address], [place], [place] [address]  Lipid panel     Status: None   Collection Time: 01/12/19  8:39 AM  Result Value Ref Range   Cholesterol 140 0 - 200 mg/dL   Triglycerides 39 <150 mg/dL   HDL 65 >40 mg/dL   Total CHOL/HDL Ratio 2.2 RATIO   VLDL 8 0 - 40 mg/dL   LDL Cholesterol 67 0 - 99 mg/dL    Comment:        Total Cholesterol/HDL:CHD Risk Coronary Heart Disease Risk Table                     Men   Women  1/2 Average Risk   3.4   3.3  Average Risk       5.0   4.4  2 X Average Risk   9.6   7.1  3 X Average Risk  23.4   11.0        Use the calculated Patient Ratio above and the CHD Risk Table to determine the patient's CHD Risk.        ATP III CLASSIFICATION (LDL):  <100     mg/dL   Optimal  100-129  mg/dL   Near or Above                    Optimal  130-159  mg/dL   Borderline  160-189  mg/dL   High  >190     mg/dL   Very High Performed at [facility], [address] [name], [place] [address]   TSH     Status: None   Collection Time: 01/12/19  8:39 AM  Result Value Ref Range   TSH 1.674 0.350 - 4.500 uIU/mL    Comment: Performed by a 3rd Generation assay with a functional sensitivity of <=0.01 uIU/mL. Performed at [facility], [address] [place],  [address]   VITAMIN D 25 Hydroxy (Vit-D Deficiency, Fractures)     Status: None   Collection Time: 01/12/19  8:39 AM  Result Value Ref Range   Vit D, 25-Hydroxy 42.1 30.0 - 100.0 ng/mL    Comment: (  NOTE) Vitamin D deficiency has been defined by the [place] practice guideline as a level of serum 25-OH vitamin D less than 20 ng/mL (1,2). The Endocrine Society went on to further define vitamin D insufficiency as a level between 21  and 29 ng/mL (2). 1. IOM (Institute of Medicine). 2010. Dietary reference   intakes for calcium and D. [place]: The   [employer]. 2. Holick MF, Binkley [place], Bischoff-Ferrari HA, et al.   Evaluation, treatment, and prevention of vitamin D   deficiency: an Endocrine Society clinical practice   guideline. JCEM. 2011 Jul; 96(7):1911-30. Performed At: [facility] [place], [place] [id number] Rush Farmer MD [id number]   Hemoglobin A1c     Status: None   Collection Time: 01/12/19  8:39 AM  Result Value Ref Range   Hgb A1c MFr Bld 4.8 4.8 - 5.6 %    Comment: (NOTE)         Prediabetes: 5.7 - 6.4         Diabetes: >6.4         Glycemic control for adults with diabetes: <7.0    Mean Plasma Glucose 91 mg/dL    Comment: (NOTE) Performed At: [facility] [place], [place] [id number] Rush Farmer MD [id number]   Novel Coronavirus, NAA ([facility])     Status: None   Collection Time: 02/01/19 12:00 AM   Specimen: Oropharyngeal(OP) collection in vial transport medium   OROPHARYNGEA  TESTING  Result Value Ref Range   SARS-CoV-2, NAA Not Detected Not Detected    Comment: This nucleic acid amplification test was developed and its performance characteristics determined by [employer]. Nucleic acid amplification tests include PCR and TMA. This test has not been FDA cleared or approved. This test has been authorized by FDA under an Emergency Use Authorization (EUA). This test is only authorized for the duration of time the declaration that circumstances exist justifying the authorization of the emergency use of in vitro diagnostic tests for detection of SARS-CoV-2 virus and/or diagnosis of COVID-19 infection under section 564(b)(1) of the Act, 21 U.S.C. [id number]) (1), unless the authorization is terminated or revoked sooner. When diagnostic testing is negative, the possibility of a false negative result should  be considered in the context of a patient's recent exposures and the presence of clinical signs and symptoms consistent with COVID-19. An individual without symptoms of COVID-19 and who is not shedding SARS-CoV-2 virus would  expect to have a negative (not detected) result in this assay.      Psychiatric Specialty Exam: Physical Exam  Review of Systems  Musculoskeletal: Positive for back pain.  Neurological: Positive for tingling.    There were no vitals taken for this visit.There is no height or weight on file to calculate BMI.  General Appearance: NA  Eye Contact:  NA  Speech:  Clear and Coherent and Normal Rate  Volume:  Normal  Mood:  Anxious  Affect:  NA  Thought Process:  Goal Directed  Orientation:  Full (Time, Place, and Person)  Thought Content:  Rumination  Suicidal Thoughts:  No  Homicidal Thoughts:  No  Memory:  Immediate;   Good Recent;   Good Remote;   Good  Judgement:  Good  Insight:  Good  Psychomotor Activity:  NA  Concentration:  Concentration: Fair and Attention Span: Fair  Recall:  Good  Fund of Knowledge:  Good  Language:  Good  Akathisia:  No  Handed:  Right  AIMS (if indicated):     Assets:  Communication Skills Desire for Improvement Housing Resilience Social Support  ADL's:  Intact  Cognition:  WNL  Sleep:   4-5 hrs     Assessment and Plan: Generalized anxiety disorder.  Major depressive disorder, recurrent.  Eating disorder NOS.  I reviewed blood work results.  Her hemoglobin A1c is normal.  Discussed her anxiety related to her husband's positive Covid test.  Encourage that her husband should get two negative test before he socialize.  Encouraged to continue therapy with her nutritionist.  She is taking the break from therapy with [facility].  Encouraged to continue medication as it is helping her.  She agreed with the plan.  She has no major side effects from the medication.  Continue Geodon 60 mg at bedtime, temazepam 22.5 mg at bedtime,  Wellbutrin 75 mg 2 tablet twice a day and Klonopin 0.5 mg twice a day as needed.  Discussed medication side effect specially benzodiazepine dependence tolerance and withdrawal.  Recommended to call us back if she has any question or any concern.  Follow-up in 3 months.  Follow Up Instructions:    I discussed the assessment and treatment plan with the patient. The patient was provided an opportunity to ask questions and all were answered. The patient agreed with the plan and demonstrated an understanding of the instructions.   The patient was advised to call back or seek an in-person evaluation if the symptoms worsen or if the condition fails to improve as anticipated.  I provided 20 minutes of non-face-to-face time during this encounter.   Kathlee Nations, MD

## 2019-03-31 DIAGNOSIS — F5001 Anorexia nervosa, restricting type: Secondary | ICD-10-CM | POA: Diagnosis not present

## 2019-03-31 DIAGNOSIS — F331 Major depressive disorder, recurrent, moderate: Secondary | ICD-10-CM | POA: Diagnosis not present

## 2019-03-31 DIAGNOSIS — F411 Generalized anxiety disorder: Secondary | ICD-10-CM | POA: Diagnosis not present

## 2019-04-04 ENCOUNTER — Other Ambulatory Visit (HOSPITAL_COMMUNITY): Payer: Self-pay | Admitting: Hematology

## 2019-04-04 DIAGNOSIS — R208 Other disturbances of skin sensation: Secondary | ICD-10-CM | POA: Diagnosis not present

## 2019-04-04 DIAGNOSIS — M62838 Other muscle spasm: Secondary | ICD-10-CM | POA: Diagnosis not present

## 2019-04-04 DIAGNOSIS — M545 Low back pain: Secondary | ICD-10-CM | POA: Diagnosis not present

## 2019-04-04 DIAGNOSIS — M6281 Muscle weakness (generalized): Secondary | ICD-10-CM | POA: Diagnosis not present

## 2019-04-04 DIAGNOSIS — N3943 Post-void dribbling: Secondary | ICD-10-CM | POA: Diagnosis not present

## 2019-04-04 DIAGNOSIS — R3911 Hesitancy of micturition: Secondary | ICD-10-CM | POA: Diagnosis not present

## 2019-04-04 DIAGNOSIS — D508 Other iron deficiency anemias: Secondary | ICD-10-CM

## 2019-04-06 DIAGNOSIS — D508 Other iron deficiency anemias: Secondary | ICD-10-CM | POA: Diagnosis not present

## 2019-04-11 DIAGNOSIS — F331 Major depressive disorder, recurrent, moderate: Secondary | ICD-10-CM | POA: Diagnosis not present

## 2019-04-11 DIAGNOSIS — F411 Generalized anxiety disorder: Secondary | ICD-10-CM | POA: Diagnosis not present

## 2019-04-11 DIAGNOSIS — F5001 Anorexia nervosa, restricting type: Secondary | ICD-10-CM | POA: Diagnosis not present

## 2019-04-12 ENCOUNTER — Other Ambulatory Visit (HOSPITAL_COMMUNITY): Payer: PPO

## 2019-04-14 ENCOUNTER — Encounter: Payer: Self-pay | Admitting: Cardiovascular Disease

## 2019-04-14 ENCOUNTER — Telehealth (INDEPENDENT_AMBULATORY_CARE_PROVIDER_SITE_OTHER): Payer: PPO | Admitting: Cardiovascular Disease

## 2019-04-14 VITALS — BP 111/68 | HR 70 | Ht 65.5 in | Wt 151.0 lb

## 2019-04-14 DIAGNOSIS — R002 Palpitations: Secondary | ICD-10-CM | POA: Diagnosis not present

## 2019-04-14 DIAGNOSIS — G894 Chronic pain syndrome: Secondary | ICD-10-CM

## 2019-04-14 DIAGNOSIS — Z7189 Other specified counseling: Secondary | ICD-10-CM

## 2019-04-14 DIAGNOSIS — D508 Other iron deficiency anemias: Secondary | ICD-10-CM

## 2019-04-14 DIAGNOSIS — G8929 Other chronic pain: Secondary | ICD-10-CM

## 2019-04-14 DIAGNOSIS — D649 Anemia, unspecified: Secondary | ICD-10-CM

## 2019-04-14 NOTE — Patient Instructions (Signed)
Medication Instructions:  Your physician recommends that you continue on your current medications as directed. Please refer to the Current Medication list given to you today.  *If you need a refill on your cardiac medications before your next appointment, please call your pharmacy*  Lab Work: None today If you have labs (blood work) drawn today and your tests are completely normal, you will receive your results only by: . MyChart Message (if you have MyChart) OR . A paper copy in the mail If you have any lab test that is abnormal or we need to change your treatment, we will call you to review the results.  Testing/Procedures: None today  Follow-Up: At [facility], you and your health needs are our priority.  As part of our continuing mission to provide you with exceptional heart care, we have created designated Provider Care Teams.  These Care Teams include your primary Cardiologist (physician) and Advanced Practice Providers (APPs -  Physician Assistants and Nurse Practitioners) who all work together to provide you with the care you need, when you need it.    Follow up as needed !     Thank you for choosing [facility] !        

## 2019-04-14 NOTE — Progress Notes (Signed)
Virtual Visit via Telephone Note   This visit type was conducted due to national recommendations for restrictions regarding the COVID-19 Pandemic (e.g. social distancing) in an effort to limit this patient's exposure and mitigate transmission in our community.  Due to her co-morbid illnesses, this patient is at least at moderate risk for complications without adequate follow up.  This format is felt to be most appropriate for this patient at this time.  The patient did not have access to video technology/had technical difficulties with video requiring transitioning to audio format only (telephone).  All issues noted in this document were discussed and addressed.  No physical exam could be performed with this format.  Please refer to the patient's chart for her  consent to telehealth for [facility].   Date:  04/14/2019   ID:  Katelyn [name], DOB Sep 13, 1975, MRN [id number]  Patient Location: Home Provider Location: Office  PCP:  Fayrene Helper, MD  Cardiologist:  Kate Sable, MD  Electrophysiologist:  None   Evaluation Performed:  Follow-Up Visit  Chief Complaint:  Palpitations  History of Present Illness:    Katelyn [name] is a 43 y.o. female with a history of palpitations, anemia, small fiber neuropathy, anxiety and depression.  I last saw her in January 2020.  She then saw a B. Strader in August complaining of palpitations.  At that time she was started on Lopressor 12.5 mg twice daily as needed.  She has not had to use metoprolol.  She primarily experiences palpitations when she lies down on her abdomen.  She continues to have chronic left-sided burning chest pains that occur at rest.  Symptoms are just below the left clavicle.  They last for several hours at a time.  She also complains of tingling and numbness of the left arm and left leg.   Past Medical History:  Diagnosis Date  . Anemia   . Anorexia   . Anxiety   . Benign juvenile melanoma    . Chronic headaches   . Colon polyps    found on colonoscopy 04/26/2012  . Complication of anesthesia    itching after epidural for c section  . Constipation   . Depression   . Depression    several suicide attempts, hospitaluzed in 2012 for this; hx pf ECT treatments ; pt sees Dr. Adele Schilder pyschiatrist  and doing well on medication  . Headache   . Heart murmur    as a child - no one mentions hearing murmur anymore  . Heartburn    no meds  . History of pneumonia    x 3  years ago - no recent problems  . Hx of blood clots   . Obesity   . Pneumonia   . Polycystic ovary    takes metformin to treat  . Skin lesion    Excisional biopsy of moles - none cancerous  . Sleep apnea    yrs ago - diagnosed mild sleep apnea - did not have to use cpap    Past Surgical History:  Procedure Laterality Date  . BREAST ENHANCEMENT SURGERY Bilateral 02/11/2017  . BREATH TEK H PYLORI N/A 07/22/2013   Procedure: BREATH TEK H PYLORI;  Surgeon: Edward Jolly, MD;  Location: [facility];  Service: General;  Laterality: N/A;  . c sections  08/28/14   x 2  . CESAREAN SECTION  2004, 2007   x 2  . CYSTOSCOPY WITH URETHRAL DILATATION  age 21   . GASTRIC ROUX-EN-Y N/A  10/31/2013   Procedure: LAPAROSCOPIC ROUX-EN-Y GASTRIC BYPASS WITH UPPER ENDOSCOPY;  Surgeon: Edward Jolly, MD;  Location: [facility];  Service: General;  Laterality: N/A;  . ingrown toenail Right    Ingrown nail on great right toe  . kidney stent  08/28/14  . mole excision     "benign juvenile melanoma" removed from left leg - inner thigh  . ROUX-EN-Y PROCEDURE  08/28/14  . SKIN LESION EXCISION     back  . TONSILLECTOMY    . TONSILLECTOMY  age 64  . TUBAL LIGATION       Current Meds  Medication Sig  . albuterol (VENTOLIN HFA) 108 (90 Base) MCG/ACT inhaler Inhale 2 puffs into the lungs every 6 (six) hours as needed for wheezing or shortness of breath.  . budesonide-formoterol (SYMBICORT) 80-4.5 MCG/ACT inhaler Inhale 2 puffs into  the lungs 2 (two) times daily.  Marland Kitchen buPROPion (WELLBUTRIN) 75 MG tablet Take 2 tab in AM  . clonazePAM (KLONOPIN) 0.5 MG tablet Take 1 tablet (0.5 mg total) by mouth 2 (two) times daily as needed for anxiety.  . Cyanocobalamin (B-12) 1000 MCG SUBL Place 1,000 mcg under the tongue daily.  Marland Kitchen gabapentin (NEURONTIN) 600 MG tablet Take 600 mg by mouth 3 (three) times daily.  Marland Kitchen HYDROcodone-acetaminophen (NORCO) 7.5-325 MG tablet Take 1 tablet by mouth every 6 (six) hours as needed for moderate pain.  . metoprolol tartrate (LOPRESSOR) 25 MG tablet Take 0.5 tablets (12.5 mg total) by mouth 2 (two) times daily as needed.  . Multiple Vitamin (MULTIVITAMIN WITH MINERALS) TABS tablet Take 3 tablets by mouth 2 (two) times daily.   . naloxone (NARCAN) nasal spray 4 mg/0.1 mL Place into the nose as needed.   . ondansetron (ZOFRAN) 4 MG tablet Take 1 tablet (4 mg total) by mouth every 8 (eight) hours as needed for nausea or vomiting.  Marland Kitchen PREMARIN vaginal cream 2 times a week  . temazepam (RESTORIL) 22.5 MG capsule Take 1 capsule (22.5 mg total) by mouth at bedtime.  Marland Kitchen tiZANidine (ZANAFLEX) 4 MG tablet Take 4 mg by mouth 3 (three) times daily as needed for muscle spasms.  Marland Kitchen topiramate (TOPAMAX) 50 MG tablet Take 1 tablet (50 mg total) by mouth 2 (two) times daily.  . ziprasidone (GEODON) 60 MG capsule Take 1 capsule (60 mg total) by mouth at bedtime.     Allergies:   Ciprofloxacin, Duloxetine hcl, Lyrica [pregabalin], Feraheme [ferumoxytol], Penicillins, and Vit b12-methionine-inos-chol   Social History   Tobacco Use  . Smoking status: Former Smoker    Packs/day: 1.00    Years: 5.00    Pack years: 5.00    Types: Cigarettes    Start date: 48    Quit date: 08/26/1997    Years since quitting: 21.6  . Smokeless tobacco: Never Used  Substance Use Topics  . Alcohol use: No    Alcohol/week: 0.0 standard drinks  . Drug use: No     Family Hx: The patient's family history includes Alcohol abuse in her  brother and father; Cancer in her maternal grandfather; Cancer (age of onset: 85) in her mother; Colon cancer (age of onset: 35) in her paternal aunt; Colon polyps in her father; Depression in her brother and father; Heart attack in her paternal grandfather; Hypercholesterolemia in her mother; Hyperlipidemia in her mother; Hypertension in her mother; Irritable bowel syndrome in her father; Kidney cancer in her paternal grandfather.  ROS:   Please see the history of present illness.  All other systems reviewed and are negative.   Prior CV studies:   The following studies were reviewed today:  Echocardiogram: 01/2018 Study Conclusions  - Left ventricle: The cavity size was normal. Wall thickness was normal. Systolic function was normal. The estimated ejection fraction was in the range of 60% to 65%. Wall motion was normal; there were no regional wall motion abnormalities. Left ventricular diastolic function parameters were normal. - Aortic valve: Mildly calcified annulus. Trileaflet; mildly thickened leaflets. There was mild regurgitation. - Mitral valve: There was trivial regurgitation. - Right atrium: Central venous pressure (est): 3 mm Hg. - Atrial septum: No defect or patent foramen ovale was identified. - Tricuspid valve: There was mild regurgitation. - Pulmonary arteries: PA peak pressure: 28 mm Hg (S). - Pericardium, extracardiac: There was no pericardial effusion.   Holter Monitor: 01/2018  Sinus rhythm with occasional PACs and PVCs. There were no significant arrhythmias.   Labs/Other Tests and Data Reviewed:    EKG:  No ECG reviewed.  Recent Labs: 01/12/2019: ALT 40; BUN 14; Creatinine, Ser 0.71; Hemoglobin 13.5; Platelets 315; Potassium 3.5; Sodium 137; TSH 1.674   Recent Lipid Panel Lab Results  Component Value Date/Time   CHOL 140 01/12/2019 08:39 AM   TRIG 39 01/12/2019 08:39 AM   HDL 65 01/12/2019 08:39 AM   CHOLHDL 2.2 01/12/2019 08:39 AM    LDLCALC 67 01/12/2019 08:39 AM   LDLCALC 61 10/09/2017 07:06 AM    Wt Readings from Last 3 Encounters:  04/14/19 151 lb (68.5 kg)  01/31/19 144 lb (65.3 kg)  01/11/19 143 lb (64.9 kg)     Objective:    Vital Signs:  BP 111/68   Pulse 70   Ht 5' 5.5" (1.664 m)   Wt 151 lb (68.5 kg)   BMI 24.75 kg/m    VITAL SIGNS:  reviewed  ASSESSMENT & PLAN:    1. Palpitations: She has been prescribed Lopressor 12.5 mg twice daily to be used as needed but has not had to use it.  Event monitor reviewed above.  2. Chronic anemia: Followed by Hematology.  Hemoglobin 13.5 on 01/12/2019.  3. Chronic pain: Reports this developed after breast augmentation surgery. Followed by Pain Management.    COVID-19 Education: The signs and symptoms of COVID-19 were discussed with the patient and how to seek care for testing (follow up with PCP or arrange E-visit).  The importance of social distancing was discussed today.  Time:   Today, I have spent 10 minutes with the patient with telehealth technology discussing the above problems.     Medication Adjustments/Labs and Tests Ordered: Current medicines are reviewed at length with the patient today.  Concerns regarding medicines are outlined above.   Tests Ordered: No orders of the defined types were placed in this encounter.   Medication Changes: No orders of the defined types were placed in this encounter.   Follow Up:  Virtual Visit  prn  Signed, Kate Sable, MD  04/14/2019 8:26 AM    [place]

## 2019-04-18 ENCOUNTER — Telehealth (INDEPENDENT_AMBULATORY_CARE_PROVIDER_SITE_OTHER): Payer: PPO | Admitting: Pulmonary Disease

## 2019-04-18 ENCOUNTER — Encounter: Payer: Self-pay | Admitting: Pulmonary Disease

## 2019-04-18 VITALS — BP 121/68 | HR 70

## 2019-04-18 DIAGNOSIS — Z20828 Contact with and (suspected) exposure to other viral communicable diseases: Secondary | ICD-10-CM | POA: Diagnosis not present

## 2019-04-18 DIAGNOSIS — J45909 Unspecified asthma, uncomplicated: Secondary | ICD-10-CM

## 2019-04-18 NOTE — Progress Notes (Signed)
Virtual Visit via Video Note  I connected with Katelyn [name] on 04/18/19 at 10:45 AM EST by a video enabled telemedicine application and verified that I am speaking with the correct person using two identifiers.  Location: Patient: Home Provider: [facility]   I discussed the limitations of evaluation and management by telemedicine and the availability of in person appointments. The patient expressed understanding and agreed to proceed.   I discussed the assessment and treatment plan with the patient. The patient was provided an opportunity to ask questions and all were answered. The patient agreed with the plan and demonstrated an understanding of the instructions.   The patient was advised to call back or seek an in-person evaluation if the symptoms worsen or if the condition fails to improve as anticipated.  I provided 25 minutes of non-face-to-face time during this encounter.   Caesar Mannella Rodman Pickle, MD   Subjective:   PATIENT ID: Katelyn [name] GENDER: female DOB: 25-May-1975, MRN: [id number]   HPI  Chief Complaint  Patient presents with  . video visit    3 month rov.  pt states that she is feeling well on Symbicort (1 puff BID).  chest tightness, sob is improved since last OV.     Reason for Visit: Follow-up for dyspnea  Katelyn [name] is a 43 year old female with history of tachybradycardia syndrome, depression, anxiety, migraine, PCOS and small fiber neuropathy who presents for follow-up  She was initially evaluated by me on 11/22/2018.  Since 2019 after an exposure to bug spray, she had burning sensation in her lungs.  On exposures to weed killer in the fall time, she has had recurrent burning sensation in her lungs difficulty getting air in.  Associated with change in taste and smell.  On our initial visit she reported shortness of breath and chest "burning" when exposed to certain irritants in the last year. She reports that has been a  persistent burning sensation that took 4-5 months before gradually improving however this spring, this symptom recurred with increased intensity when husband was putting down fertilizer in the yard. Now any irritant including laundry detergent, shampoos, hand sanitizer, deodarants and any scented products. If she removes the irritant (washing hands, removing clothes), it will take 30-60 minutes for the intensity of the burning will subside. She describes the irritation located in the throat and deep irritation feeling like it is in the lungs. However she has a constant low-level burn. She has switched to unscented products and this has helped.  She does not thing her dyspnea is not necessarily associated with this burning sensation. She has had shortness of breath for the last year. This worsens with exertion including ambulating in the house. She believes due to her deconditioning since her breast implant in October 2018 where afterwards she had severe chronic pain from unclear etiology. She has been evaluated for [facility] and recommended management of symptoms as work-up negative per patient.   Interval History Since our last visit, she has been compliant with her Symbicort 1 puff twice a day. She tried to increase 2 puffs twice a day however she was not able to tolerate due to feeling like it was too much. She does not need to use her albuterol inhaler unless she has an exposure. She is diligent about limiting products with scents or irritants. Her husband was recently diagnosed with COVID-19 and she has limited her exposure during his infectious period. She continues to have intermittent episodes of shortness of breath  and chest burning after being exposed to irritants. She was seen by her Neurology who suggested that her small fiber neuropathy could contribute.  Social History: Former smoker.  5-pack-year history.  Quit in [place] exposures: As above  I have personally reviewed patient's past  medical/family/social history/allergies/current medications.  Past Medical History:  Diagnosis Date  . Anemia   . Anorexia   . Anxiety   . Benign juvenile melanoma   . Chronic headaches   . Colon polyps    found on colonoscopy 04/26/2012  . Complication of anesthesia    itching after epidural for c section  . Constipation   . Depression   . Depression    several suicide attempts, hospitaluzed in 2012 for this; hx pf ECT treatments ; pt sees Dr. Adele Schilder pyschiatrist  and doing well on medication  . Headache   . Heart murmur    as a child - no one mentions hearing murmur anymore  . Heartburn    no meds  . History of pneumonia    x 3  years ago - no recent problems  . Hx of blood clots   . Obesity   . Pneumonia   . Polycystic ovary    takes metformin to treat  . Skin lesion    Excisional biopsy of moles - none cancerous  . Sleep apnea    yrs ago - diagnosed mild sleep apnea - did not have to use cpap      Family History  Problem Relation Age of Onset  . Hypercholesterolemia Mother   . Hypertension Mother        Iterstitial Cystist  . Hyperlipidemia Mother   . Cancer Mother 62       breast   . Depression Brother   . Alcohol abuse Brother   . Colon polyps Father   . Depression Father   . Irritable bowel syndrome Father   . Alcohol abuse Father   . Colon cancer Paternal Aunt 29  . Heart attack Paternal Grandfather   . Kidney cancer Paternal Grandfather   . Cancer Maternal Grandfather        unknown type     Social History   Occupational History  . Occupation: Museum/gallery exhibitions officer  . Occupation: Disabled    Employer: UNEMPLOYED  Tobacco Use  . Smoking status: Former Smoker    Packs/day: 1.00    Years: 5.00    Pack years: 5.00    Types: Cigarettes    Start date: 29    Quit date: 08/26/1997    Years since quitting: 21.6  . Smokeless tobacco: Never Used  Substance and Sexual Activity  . Alcohol use: No    Alcohol/week: 0.0 standard drinks  . Drug use: No  .  Sexual activity: Yes    Partners: Male    Birth control/protection: Surgical, Other-see comments    Allergies  Allergen Reactions  . Ciprofloxacin Other (See Comments)    Nerve pain  Nerve pain  Nerve pain   . Duloxetine Hcl Anaphylaxis    Make her more depressed, having nausea, vomiting, headache and threw up. Make her more depressed, having nausea, vomiting, headache and threw up.  Recardo Evangelist [Pregabalin] Anaphylaxis  . Feraheme [Ferumoxytol] Hives  . Penicillins     Has patient had a PCN reaction causing immediate rash, facial/tongue/throat swelling, SOB or lightheadedness with hypotension: Yes Has patient had a PCN reaction causing severe rash involving mucus membranes or skin necrosis: No Has patient had a PCN  reaction that required hospitalization No Has patient had a PCN reaction occurring within the last 10 years: No If all of the above answers are "NO", then may proceed with Cephalosporin use.     REACTION: Rash  . Vit B12-Methionine-Inos-Chol      Outpatient Medications Prior to Visit  Medication Sig Dispense Refill  . albuterol (VENTOLIN HFA) 108 (90 Base) MCG/ACT inhaler Inhale 2 puffs into the lungs every 6 (six) hours as needed for wheezing or shortness of breath. 8 g 6  . budesonide-formoterol (SYMBICORT) 80-4.5 MCG/ACT inhaler Inhale 2 puffs into the lungs 2 (two) times daily. 6.9 g 5  . buPROPion (WELLBUTRIN) 75 MG tablet Take 2 tab in AM 60 tablet 2  . clonazePAM (KLONOPIN) 0.5 MG tablet Take 1 tablet (0.5 mg total) by mouth 2 (two) times daily as needed for anxiety. 60 tablet 1  . Cyanocobalamin (B-12) 1000 MCG SUBL Place 1,000 mcg under the tongue daily. 30 each 6  . gabapentin (NEURONTIN) 600 MG tablet Take 600 mg by mouth 3 (three) times daily.    Marland Kitchen HYDROcodone-acetaminophen (NORCO) 7.5-325 MG tablet Take 1 tablet by mouth every 6 (six) hours as needed for moderate pain.    . metoprolol tartrate (LOPRESSOR) 25 MG tablet Take 0.5 tablets (12.5 mg total) by  mouth 2 (two) times daily as needed. 180 tablet 3  . Multiple Vitamin (MULTIVITAMIN WITH MINERALS) TABS tablet Take 3 tablets by mouth 2 (two) times daily.     . naloxone (NARCAN) nasal spray 4 mg/0.1 mL Place into the nose as needed.     . ondansetron (ZOFRAN) 4 MG tablet Take 1 tablet (4 mg total) by mouth every 8 (eight) hours as needed for nausea or vomiting. 20 tablet 1  . PREMARIN vaginal cream 2 times a week    . temazepam (RESTORIL) 22.5 MG capsule Take 1 capsule (22.5 mg total) by mouth at bedtime. 30 capsule 2  . tiZANidine (ZANAFLEX) 4 MG tablet Take 4 mg by mouth 3 (three) times daily as needed for muscle spasms.    Marland Kitchen topiramate (TOPAMAX) 50 MG tablet Take 1 tablet (50 mg total) by mouth 2 (two) times daily. 60 tablet 5  . ziprasidone (GEODON) 60 MG capsule Take 1 capsule (60 mg total) by mouth at bedtime. 30 capsule 2   No facility-administered medications prior to visit.    Review of Systems  Constitutional: Negative for chills, diaphoresis, fever, malaise/fatigue and weight loss.  HENT: Positive for congestion.   Respiratory: Positive for cough and shortness of breath. Negative for hemoptysis, sputum production and wheezing.   Cardiovascular: Negative for chest pain, palpitations and leg swelling.     Objective:   Vitals:   04/18/19 1101  BP: 121/68  Pulse: 70  SpO2: 98%     Physical Exam: General: Well-appearing, no acute distress HENT: [place], AT, OP clear, MMM Eyes: EOMI, no scleral icterus Respiratory: Clear to auscultation bilaterally.  No crackles, wheezing or rales Cardiovascular: RRR, -M/R/G, no JVD GI: BS+, soft, nontender Extremities:-Edema,-tenderness Neuro: AAO x4, CNII-XII grossly intact Skin: Intact, no rashes or bruising Psych: Normal mood, normal affect  Data Reviewed:  Imaging: CXR 08/04/2017-no acute cardiopulmonary abnormalities.  Mild hyperinflation without flattening of the diaphragms CXR 12/09/2016-mild hyperinflation.  No infiltrates,  effusion or edema.  PFT: None on file  Labs: CBC and CMP 11/02/2018-unremarkable.  Eosinophils 100  Imaging, labs and test noted above have been reviewed independently by me.    Assessment & Plan:  Irritant-induced asthma  Discussion: 43 year old female who presents for follow-up for presumed irritant-induced asthma. Tolerating low-dose ICS/LABA which she subjectively feels improves her symptoms.   Medications:  CONTINUE Symbicort 80-4.5 mcg up to TWO puffs twice a day. Advised to separate each puff by 30-60 minutes.   CONTINUE Albuterol 2 puffs as needed every 4-6 hours for shortness of breath or wheezing. THIS IS RESCUE INHALER   Asthma Action Plan:  Take Albuterol as needed for shortness of breath. If symptoms persistent including coughing, shortness of breath or wheezing, please call office to consider short-course of steroids.   Other recommendations:  Minimize exposures  Continue physical therapy at home via [facility] Maintenance Immunization History  Administered Date(s) Administered  . Influenza Split 03/03/2012, 02/18/2015  . Influenza,inj,Quad PF,6+ Mos 01/31/2016, 03/03/2017, 01/13/2018, 01/04/2019  . PPD Test 12/27/2014  . Tdap 03/31/2011   No orders of the defined types were placed in this encounter.  No orders of the defined types were placed in this encounter.  Return in about 3 months (around 07/17/2019).   Manuel Garcia, MD [facility] 04/18/2019 10:08 AM  Office Number [phone]

## 2019-04-19 ENCOUNTER — Inpatient Hospital Stay (HOSPITAL_COMMUNITY): Payer: PPO | Attending: Hematology | Admitting: Hematology

## 2019-04-19 ENCOUNTER — Telehealth (HOSPITAL_COMMUNITY): Payer: Self-pay | Admitting: Hematology

## 2019-04-19 DIAGNOSIS — D508 Other iron deficiency anemias: Secondary | ICD-10-CM

## 2019-04-19 DIAGNOSIS — Z9884 Bariatric surgery status: Secondary | ICD-10-CM | POA: Diagnosis not present

## 2019-04-21 NOTE — Progress Notes (Signed)
I connected with Katelyn [name] on 04/19/19 at 11:10 AM EST by telephone visit and verified that I am speaking with the correct person using two identifiers.   I discussed the limitations, risks, security and privacy concerns of performing an evaluation and management service by telemedicine and the availability of in-person appointments. I also discussed with the patient that there may be a patient responsible charge related to this service. The patient expressed understanding and agreed to proceed.    Patient's location: Home Provider's location: [facility]    History of Present Illness: Iron deficiency anemia, secondary to malabsorption from gastric bypass surgery   Chief Complaint:  Katelyn [name] is interviewed via phone today.  She reports overall doing well.  She states her energy level is stable to improved.  She denies any obvious signs of bleeding.  No change in bowel habits.  Appetite is stable.  No weight loss.  Continues with reports of chronic pain.  She denies any chest pain, shortness of breath, lightheadedness or dizziness.  We will discuss most recent labs today.   Assessment and Plan: 1.Iron deficiency anemia secondary to malabsorption -S/P gastric bypass and on chronic PPI therapy. -Continue with IV iron as needed.Had previous allergic reaction to [facility]. Has tolerated 2 doses of Injectafer without complaints.  -Patient received Injectafer on 01/28/2019.  She states she developed a fever 2 days post infusion.  She is current that she had a delayed reaction, which is feasible.  Would recommend to give premeds if Injectafer is administered again. -Labs are stable.  No indication for IV parenteral iron at this time.  We will continue to monitor.

## 2019-04-25 ENCOUNTER — Emergency Department (HOSPITAL_COMMUNITY)
Admission: EM | Admit: 2019-04-25 | Discharge: 2019-04-25 | Disposition: A | Discharge status: Home / Self Care | Payer: PPO | Attending: Emergency Medicine | Admitting: Emergency Medicine

## 2019-04-25 ENCOUNTER — Encounter (HOSPITAL_COMMUNITY): Payer: Self-pay | Admitting: Emergency Medicine

## 2019-04-25 ENCOUNTER — Other Ambulatory Visit: Payer: Self-pay

## 2019-04-25 ENCOUNTER — Telehealth: Payer: Self-pay

## 2019-04-25 DIAGNOSIS — J45909 Unspecified asthma, uncomplicated: Secondary | ICD-10-CM | POA: Diagnosis not present

## 2019-04-25 DIAGNOSIS — R072 Precordial pain: Secondary | ICD-10-CM | POA: Diagnosis not present

## 2019-04-25 DIAGNOSIS — R079 Chest pain, unspecified: Secondary | ICD-10-CM | POA: Insufficient documentation

## 2019-04-25 DIAGNOSIS — Z87891 Personal history of nicotine dependence: Secondary | ICD-10-CM | POA: Diagnosis not present

## 2019-04-25 DIAGNOSIS — Z79899 Other long term (current) drug therapy: Secondary | ICD-10-CM | POA: Diagnosis not present

## 2019-04-25 NOTE — Telephone Encounter (Signed)
Pt called complaining of chest pain since yesterday @ 1:30. Advised pt she needed to be evaluated in ED. She refused. Made appt for 04/28/2019 with Bernerd Pho, PA-C.

## 2019-04-25 NOTE — ED Provider Notes (Addendum)
[facility] EMERGENCY DEPARTMENT Provider Note   CSN: [id number] Arrival date & time: 04/25/19  1604     History Chief Complaint  Patient presents with  . Chest Pain    Katelyn [name] is a 43 y.o. female.  HPI   sharp and burning chest pain - intermittent since yesterday - she is on chronic pain meds and thinks it is coming after hydrocodone dose - has had this for years without side effects - she took it again today and it cam eon again - is now constant - non radiating and located just left of sternum, burning and sharp in nature.  No radiation to the arm / jaw / back.  No sob, no cough, no fevers.    Past Medical History:  Diagnosis Date  . Anemia   . Anorexia   . Anxiety   . Benign juvenile melanoma   . Chronic headaches   . Colon polyps    found on colonoscopy 04/26/2012  . Complication of anesthesia    itching after epidural for c section  . Constipation   . Depression   . Depression    several suicide attempts, hospitaluzed in 2012 for this; hx pf ECT treatments ; pt sees Dr. Adele Schilder pyschiatrist  and doing well on medication  . Headache   . Heart murmur    as a child - no one mentions hearing murmur anymore  . Heartburn    no meds  . History of pneumonia    x 3  years ago - no recent problems  . Hx of blood clots   . Obesity   . Pneumonia   . Polycystic ovary    takes metformin to treat  . Skin lesion    Excisional biopsy of moles - none cancerous  . Sleep apnea    yrs ago - diagnosed mild sleep apnea - did not have to use cpap     Patient Active Problem List   Diagnosis Date Noted  . Asthma, exogenous, unspecified asthma severity, uncomplicated [id number]  . Multiple environmental allergies 08/29/2018  . B12 deficiency anemia 06/21/2018  . Venous insufficiency 04/23/2018  . Onychomycosis of toenail 04/17/2018  . Chronic myofascial pain 03/18/2018  . Small fiber neuropathy 03/18/2018  . Idiopathic small fiber sensory neuropathy  02/23/2018  . Varicose veins of both lower extremities 01/13/2018  . Generalized pain 10/16/2017  . Irregular menses 10/16/2017  . Cervical spondylosis without myelopathy 08/28/2017  . Migraine with aura and without status migrainosus, not intractable 07/22/2017  . Lumbar degenerative disc disease 07/20/2017  . Spondylosis of lumbar spine 07/20/2017  . Coccydynia 03/12/2017  . Bariatric surgery status 05/02/2016  . Major depressive disorder, recurrent episode, moderate ([place]) 02/18/2016  . Tachy-brady syndrome ([place]) 12/19/2015  . Fever 12/19/2015  . Candidal vulvovaginitis 10/15/2015  . Iron deficiency anemia 06/10/2015  . Sleep disorder 06/01/2013  . Generalized anxiety disorder 05/30/2013  . Chronic headaches 07/01/2012  . Depression, major, recurrent, severe with psychosis ([place]) 06/17/2012  . Fatigue 03/05/2012  . Family hx colonic polyps 03/03/2012  . Eating disorder 12/18/2011  . POLYCYSTIC OVARIAN DISEASE 09/01/2008    Past Surgical History:  Procedure Laterality Date  . BREAST ENHANCEMENT SURGERY Bilateral 02/11/2017  . BREATH TEK H PYLORI N/A 07/22/2013   Procedure: BREATH TEK H PYLORI;  Surgeon: Edward Jolly, MD;  Location: [facility];  Service: General;  Laterality: N/A;  . c sections  08/28/14   x 2  . CESAREAN SECTION  2004, 2007  x 2  . CYSTOSCOPY WITH URETHRAL DILATATION  age 81   . GASTRIC ROUX-EN-Y N/A 10/31/2013   Procedure: LAPAROSCOPIC ROUX-EN-Y GASTRIC BYPASS WITH UPPER ENDOSCOPY;  Surgeon: Edward Jolly, MD;  Location: [facility] ORS;  Service: General;  Laterality: N/A;  . ingrown toenail Right    Ingrown nail on great right toe  . kidney stent  08/28/14  . mole excision     "benign juvenile melanoma" removed from left leg - inner thigh  . ROUX-EN-Y PROCEDURE  08/28/14  . SKIN LESION EXCISION     back  . TONSILLECTOMY    . TONSILLECTOMY  age 39  . TUBAL LIGATION       OB History    Gravida  0   Para  0   Term  0   Preterm  0   AB  0    Living        SAB  0   TAB  0   Ectopic  0   Multiple      Live Births              Family History  Problem Relation Age of Onset  . Hypercholesterolemia Mother   . Hypertension Mother        Iterstitial Cystist  . Hyperlipidemia Mother   . Cancer Mother 60       breast   . Depression Brother   . Alcohol abuse Brother   . Colon polyps Father   . Depression Father   . Irritable bowel syndrome Father   . Alcohol abuse Father   . Colon cancer Paternal Aunt 91  . Heart attack Paternal Grandfather   . Kidney cancer Paternal Grandfather   . Cancer Maternal Grandfather        unknown type    Social History   Tobacco Use  . Smoking status: Former Smoker    Packs/day: 1.00    Years: 5.00    Pack years: 5.00    Types: Cigarettes    Start date: 16    Quit date: 08/26/1997    Years since quitting: 21.6  . Smokeless tobacco: Never Used  Substance Use Topics  . Alcohol use: No    Alcohol/week: 0.0 standard drinks  . Drug use: No    Home Medications Prior to Admission medications   Medication Sig Start Date End Date Taking? Authorizing Provider  albuterol (VENTOLIN HFA) 108 (90 Base) MCG/ACT inhaler Inhale 2 puffs into the lungs every 6 (six) hours as needed for wheezing or shortness of breath. 11/22/18   Margaretha Seeds, MD  Ascorbic Acid (VITAMIN C) 100 MG tablet Take 100 mg by mouth daily.    [provider]  budesonide-formoterol (SYMBICORT) 80-4.5 MCG/ACT inhaler Inhale 2 puffs into the lungs 2 (two) times daily. 01/05/19   Margaretha Seeds, MD  buPROPion [facility]) 75 MG tablet Take 2 tab in AM 03/29/19   Arfeen, Arlyce Harman, MD  calcium carbonate (OS-CAL - DOSED IN MG OF ELEMENTAL CALCIUM) 1250 (500 Ca) MG tablet Take 1 tablet by mouth.    [provider]  cholecalciferol (VITAMIN D3) 25 MCG (1000 UT) tablet Take 1,000 Units by mouth daily.    [provider]  clonazePAM (KLONOPIN) 0.5 MG tablet Take 1 tablet (0.5 mg total) by mouth 2  (two) times daily as needed for anxiety. 03/29/19 10/03/22  Arfeen, Arlyce Harman, MD  Cyanocobalamin (B-12) 1000 MCG SUBL Place 1,000 mcg under the tongue daily. 01/01/16   Renato Battles,  Frederich Cha, NP  gabapentin (NEURONTIN) 600 MG tablet Take 600 mg by mouth 3 (three) times daily.    [provider]  HYDROcodone-acetaminophen (NORCO) 7.5-325 MG tablet Take 1 tablet by mouth every 6 (six) hours as needed for moderate pain.    [provider]  metoprolol tartrate (LOPRESSOR) 25 MG tablet Take 0.5 tablets (12.5 mg total) by mouth 2 (two) times daily as needed. 12/14/18 04/18/19  Erma Heritage, PA-C  Multiple Vitamin (MULTIVITAMIN WITH MINERALS) TABS tablet Take 3 tablets by mouth 2 (two) times daily.     [provider]  naloxone [facility]) nasal spray 4 mg/0.1 mL Place into the nose as needed.  03/18/18   [provider]  ondansetron (ZOFRAN) 4 MG tablet Take 1 tablet (4 mg total) by mouth every 8 (eight) hours as needed for nausea or vomiting. 01/11/19   Fayrene Helper, MD  PREMARIN vaginal cream 2 times a week 01/01/18   [provider]  temazepam (RESTORIL) 22.5 MG capsule Take 1 capsule (22.5 mg total) by mouth at bedtime. 03/29/19   Arfeen, Arlyce Harman, MD  tiZANidine (ZANAFLEX) 4 MG tablet Take 4 mg by mouth at bedtime.     [provider]  Zinc 100 MG TABS Take 1 tablet by mouth daily.    [provider]  ziprasidone (GEODON) 60 MG capsule Take 1 capsule (60 mg total) by mouth at bedtime. 03/29/19   Arfeen, Arlyce Harman, MD    Allergies    Ciprofloxacin, Duloxetine hcl, Lyrica [pregabalin], Feraheme [ferumoxytol], Penicillins, and Vit b12-methionine-inos-chol  Review of Systems   Review of Systems  All other systems reviewed and are negative.   Physical Exam Updated Vital Signs BP (!) 128/93 (BP Location: Right Arm)   Pulse 90   Temp 99.6 F (37.6 C) (Oral)   Resp 16   Ht 1.651 m (5\' 5" )   Wt 68.9 kg   SpO2 99%   BMI 25.29 kg/m    Physical Exam Vitals and nursing note reviewed.  Constitutional:      General: She is not in acute distress.    Appearance: She is well-developed.  HENT:     Head: Normocephalic and atraumatic.     Mouth/Throat:     Pharynx: No oropharyngeal exudate.  Eyes:     General: No scleral icterus.       Right eye: No discharge.        Left eye: No discharge.     Conjunctiva/sclera: Conjunctivae normal.     Pupils: Pupils are equal, round, and reactive to light.  Neck:     Thyroid: No thyromegaly.     Vascular: No JVD.  Cardiovascular:     Rate and Rhythm: Normal rate and regular rhythm.     Heart sounds: Normal heart sounds. No murmur. No friction rub. No gallop.   Pulmonary:     Effort: Pulmonary effort is normal. No respiratory distress.     Breath sounds: Normal breath sounds. No wheezing or rales.  Chest:     Chest wall: No tenderness.  Abdominal:     General: Bowel sounds are normal. There is no distension.     Palpations: Abdomen is soft. There is no mass.     Tenderness: There is no abdominal tenderness.  Musculoskeletal:        General: No tenderness. Normal range of motion.     Cervical back: Normal range of motion and neck supple.     Right lower leg: No  edema.     Left lower leg: No edema.  Lymphadenopathy:     Cervical: No cervical adenopathy.  Skin:    General: Skin is warm and dry.     Findings: No erythema or rash.  Neurological:     Mental Status: She is alert.     Coordination: Coordination normal.  Psychiatric:        Behavior: Behavior normal.     ED Results / Procedures / Treatments   Labs (all labs ordered are listed, but only abnormal results are displayed) Labs Reviewed - No data to display  EKG EKG Interpretation  Date/Time:  Monday April 25 2019 16:17:24 EST Ventricular Rate:  94 PR Interval:  150 QRS Duration: 76 QT Interval:  342 QTC Calculation: 427 R Axis:   63 Text Interpretation: Normal sinus rhythm Right atrial enlargement  Borderline ECG Since last tracing rate faster Confirmed by Noemi Chapel [phone]) on 04/25/2019 11:50:09 PM   Radiology No results found.  Procedures Procedures (including critical care time)  Medications Ordered in ED Medications - No data to display  ED Course  I have reviewed the triage vital signs and the nursing notes.  Pertinent labs & imaging results that were available during my care of the patient were reviewed by me and considered in my medical decision making (see chart for details).    MDM Rules/Calculators/A&P                      Reports hx of DVT (?), but never treated, no hx of PE - no hx of ACS , no tob or ETOH use - she is low risk but having ongoing CP - EKG normal - check labs.  Pt appears well.,   128/93 100% on ra 78 pulse. Normal pulses at radial areteries bilaterally.  The patient left after I saw her, she did not wait for results, left AGAINST MEDICAL ADVICE  Final Clinical Impression(s) / ED Diagnoses Final diagnoses:  Left-sided chest pain    Rx / DC Orders ED Discharge Orders    None       Noemi Chapel, MD 04/25/19 [id number]    Noemi Chapel, MD 04/25/19 2350

## 2019-04-25 NOTE — ED Triage Notes (Signed)
C/o chest pain.  Pt says she has chest pain after taking her pain medication.  Pt has chronic pain and under pain management.  Dr Sabra Heck in to see pt in triage.

## 2019-04-26 DIAGNOSIS — F5001 Anorexia nervosa, restricting type: Secondary | ICD-10-CM | POA: Diagnosis not present

## 2019-04-26 DIAGNOSIS — F411 Generalized anxiety disorder: Secondary | ICD-10-CM | POA: Diagnosis not present

## 2019-04-26 DIAGNOSIS — F331 Major depressive disorder, recurrent, moderate: Secondary | ICD-10-CM | POA: Diagnosis not present

## 2019-04-27 DIAGNOSIS — G894 Chronic pain syndrome: Secondary | ICD-10-CM | POA: Diagnosis not present

## 2019-04-27 DIAGNOSIS — M7918 Myalgia, other site: Secondary | ICD-10-CM | POA: Diagnosis not present

## 2019-04-28 ENCOUNTER — Encounter: Payer: Self-pay | Admitting: Student

## 2019-04-28 ENCOUNTER — Other Ambulatory Visit: Payer: Self-pay

## 2019-04-28 ENCOUNTER — Telehealth (INDEPENDENT_AMBULATORY_CARE_PROVIDER_SITE_OTHER): Payer: PPO | Admitting: Student

## 2019-04-28 ENCOUNTER — Encounter: Payer: Self-pay | Admitting: Family Medicine

## 2019-04-28 ENCOUNTER — Ambulatory Visit (INDEPENDENT_AMBULATORY_CARE_PROVIDER_SITE_OTHER): Payer: PPO | Admitting: Family Medicine

## 2019-04-28 VITALS — BP 94/68 | HR 76 | Ht 65.5 in | Wt 150.0 lb

## 2019-04-28 VITALS — BP 96/60 | HR 74 | Ht 65.5 in | Wt 150.0 lb

## 2019-04-28 DIAGNOSIS — R072 Precordial pain: Secondary | ICD-10-CM

## 2019-04-28 DIAGNOSIS — Z09 Encounter for follow-up examination after completed treatment for conditions other than malignant neoplasm: Secondary | ICD-10-CM

## 2019-04-28 DIAGNOSIS — R0789 Other chest pain: Secondary | ICD-10-CM

## 2019-04-28 DIAGNOSIS — R002 Palpitations: Secondary | ICD-10-CM

## 2019-04-28 NOTE — Patient Instructions (Signed)
Medication Instructions:  Your physician recommends that you continue on your current medications as directed. Please refer to the Current Medication list given to you today.   Labwork: none  Testing/Procedures: none  Follow-Up:  Your physician recommends that you schedule a follow-up appointment in: 3-4 months  Any Other Special Instructions Will Be Listed Below (If Applicable).  If you need a refill on your cardiac medications before your next appointment, please call your pharmacy.  

## 2019-04-28 NOTE — Progress Notes (Signed)
Virtual Visit via Telephone Note   This visit type was conducted due to national recommendations for restrictions regarding the COVID-19 Pandemic (e.g. social distancing) in an effort to limit this patient's exposure and mitigate transmission in our community.  Due to her co-morbid illnesses, this patient is at least at moderate risk for complications without adequate follow up.  This format is felt to be most appropriate for this patient at this time.  The patient did not have access to video technology/had technical difficulties with video requiring transitioning to audio format only (telephone).  All issues noted in this document were discussed and addressed.  No physical exam could be performed with this format.  Please refer to the patient's chart for her  consent to telehealth for [facility].   Date:  04/28/2019   ID:  Katelyn [name], DOB 06-04-1975, MRN [id number]  Patient Location: Home Provider Location: Office  PCP:  Fayrene Helper, MD  Cardiologist:  Kate Sable, MD  Electrophysiologist:  None   Evaluation Performed:  Follow-Up Visit  Chief Complaint: Chest Pain  History of Present Illness:    Katelyn LIUZZI is a 43 y.o. female with past medical history of PCOS, palpitations (PAC's and PVC's by prior monitor), anemia, anxiety and neuropathy who presents for a follow-up telehealth visit in regards to recent chest pain.  She most recently had a phone visit with Dr. Bronson Ing on 04/14/2019 and reported some palpitations but had not had to use her as needed Lopressor. She was continuing to have left-sided chest pain which can last for hours at a time and had been occurring since prior breast augmentation surgery and was followed by pain management.  In the interim, she was evaluated at [facility] ED on 04/25/2019 for recurrent chest pain. She thought that her symptoms might actually be secondary to her Hydrocodone as symptoms started after she  took the medication. She denied any associated symptoms at that time. EKG showed normal sinus rhythm with no acute ST changes. It appears she left AMA prior to labs being obtained.  In talking with the patient today, she reports having intermittent episodes of chest discomfort since her ED visit earlier this week. Says she had to leave prior to full evaluation due to standing for an extended period of time and having worsening neuropathy. She describes her pain as a sharp, intense discomfort along her left pectoral region which radiates into her shoulder and says this feels different from her prior pain since breast augmentation surgery. Her pain is not reproducible with palpation or positional changes. It is not associated with exertion and can occur at rest or with activity. Her pain can last for hours at a time and has been intermittent over the past several days. No associated nausea, vomiting or diaphoresis. No recent orthopnea, PND or lower extremity edema. No known family history of premature CAD. She does report having restarted Topamax within the past week and questions if this contributed to her symptoms.  She does continue to experience intermittent palpitations but says this has overall improved. She has not had to utilize as needed Lopressor.  The patient does not have symptoms concerning for COVID-19 infection (fever, chills, cough, or new shortness of breath).    Past Medical History:  Diagnosis Date  . Anemia   . Anorexia   . Anxiety   . Benign juvenile melanoma   . Chronic headaches   . Colon polyps    found on colonoscopy 04/26/2012  . Complication  of anesthesia    itching after epidural for c section  . Constipation   . Depression   . Depression    several suicide attempts, hospitaluzed in 2012 for this; hx pf ECT treatments ; pt sees Dr. Adele Schilder pyschiatrist  and doing well on medication  . Headache   . Heart murmur    as a child - no one mentions hearing murmur anymore    . Heartburn    no meds  . History of pneumonia    x 3  years ago - no recent problems  . Hx of blood clots   . Obesity   . Pneumonia   . Polycystic ovary    takes metformin to treat  . Skin lesion    Excisional biopsy of moles - none cancerous  . Sleep apnea    yrs ago - diagnosed mild sleep apnea - did not have to use cpap    Past Surgical History:  Procedure Laterality Date  . BREAST ENHANCEMENT SURGERY Bilateral 02/11/2017  . BREATH TEK H PYLORI N/A 07/22/2013   Procedure: BREATH TEK H PYLORI;  Surgeon: Edward Jolly, MD;  Location: [facility];  Service: General;  Laterality: N/A;  . c sections  08/28/14   x 2  . CESAREAN SECTION  2004, 2007   x 2  . CYSTOSCOPY WITH URETHRAL DILATATION  age 32   . GASTRIC ROUX-EN-Y N/A 10/31/2013   Procedure: LAPAROSCOPIC ROUX-EN-Y GASTRIC BYPASS WITH UPPER ENDOSCOPY;  Surgeon: Edward Jolly, MD;  Location: [facility];  Service: General;  Laterality: N/A;  . ingrown toenail Right    Ingrown nail on great right toe  . kidney stent  08/28/14  . mole excision     "benign juvenile melanoma" removed from left leg - inner thigh  . ROUX-EN-Y PROCEDURE  08/28/14  . SKIN LESION EXCISION     back  . TONSILLECTOMY    . TONSILLECTOMY  age 3  . TUBAL LIGATION       Current Meds  Medication Sig  . albuterol (VENTOLIN HFA) 108 (90 Base) MCG/ACT inhaler Inhale 2 puffs into the lungs every 6 (six) hours as needed for wheezing or shortness of breath.  . Ascorbic Acid (VITAMIN C) 100 MG tablet Take 100 mg by mouth daily.  . budesonide-formoterol (SYMBICORT) 80-4.5 MCG/ACT inhaler Inhale 2 puffs into the lungs 2 (two) times daily.  Marland Kitchen buPROPion (WELLBUTRIN) 75 MG tablet Take 2 tab in AM  . calcium carbonate (OS-CAL - DOSED IN MG OF ELEMENTAL CALCIUM) 1250 (500 Ca) MG tablet Take 1 tablet by mouth.  . cholecalciferol (VITAMIN D3) 25 MCG (1000 UT) tablet Take 1,000 Units by mouth daily.  . clonazePAM (KLONOPIN) 0.5 MG tablet Take 1 tablet (0.5 mg total)  by mouth 2 (two) times daily as needed for anxiety.  . Cyanocobalamin (B-12) 1000 MCG SUBL Place 1,000 mcg under the tongue daily.  Marland Kitchen gabapentin (NEURONTIN) 600 MG tablet Take 600 mg by mouth 3 (three) times daily.  Marland Kitchen HYDROcodone-acetaminophen (NORCO) 7.5-325 MG tablet Take 1 tablet by mouth every 6 (six) hours as needed for moderate pain.  . metoprolol tartrate (LOPRESSOR) 25 MG tablet Take 0.5 tablets (12.5 mg total) by mouth 2 (two) times daily as needed.  . Multiple Vitamin (MULTIVITAMIN WITH MINERALS) TABS tablet Take 3 tablets by mouth 2 (two) times daily.   . naloxone (NARCAN) nasal spray 4 mg/0.1 mL Place into the nose as needed.   . ondansetron (ZOFRAN) 4 MG tablet Take 1  tablet (4 mg total) by mouth every 8 (eight) hours as needed for nausea or vomiting.  Marland Kitchen PREMARIN vaginal cream 2 times a week  . temazepam (RESTORIL) 22.5 MG capsule Take 1 capsule (22.5 mg total) by mouth at bedtime.  Marland Kitchen tiZANidine (ZANAFLEX) 4 MG tablet Take 4 mg by mouth at bedtime.   . topiramate (TOPAMAX) 50 MG tablet Take 50 mg by mouth daily.  . Zinc 100 MG TABS Take 1 tablet by mouth daily.  . ziprasidone (GEODON) 60 MG capsule Take 1 capsule (60 mg total) by mouth at bedtime.     Allergies:   Ciprofloxacin, Duloxetine hcl, Lyrica [pregabalin], Feraheme [ferumoxytol], Penicillins, and Vit b12-methionine-inos-chol   Social History   Tobacco Use  . Smoking status: Former Smoker    Packs/day: 1.00    Years: 5.00    Pack years: 5.00    Types: Cigarettes    Start date: 5    Quit date: 08/26/1997    Years since quitting: 21.6  . Smokeless tobacco: Never Used  Substance Use Topics  . Alcohol use: No    Alcohol/week: 0.0 standard drinks  . Drug use: No     Family Hx: The patient's family history includes Alcohol abuse in her brother and father; Cancer in her maternal grandfather; Cancer (age of onset: 65) in her mother; Colon cancer (age of onset: 45) in her paternal aunt; Colon polyps in her father;  Depression in her brother and father; Heart attack in her paternal grandfather; Hypercholesterolemia in her mother; Hyperlipidemia in her mother; Hypertension in her mother; Irritable bowel syndrome in her father; Kidney cancer in her paternal grandfather.  ROS:   Please see the history of present illness.     All other systems reviewed and are negative.   Prior CV studies:   The following studies were reviewed today:  Echocardiogram: 01/2018 Study Conclusions  - Left ventricle: The cavity size was normal. Wall thickness was   normal. Systolic function was normal. The estimated ejection   fraction was in the range of 60% to 65%. Wall motion was normal;   there were no regional wall motion abnormalities. Left   ventricular diastolic function parameters were normal. - Aortic valve: Mildly calcified annulus. Trileaflet; mildly   thickened leaflets. There was mild regurgitation. - Mitral valve: There was trivial regurgitation. - Right atrium: Central venous pressure (est): 3 mm Hg. - Atrial septum: No defect or patent foramen ovale was identified. - Tricuspid valve: There was mild regurgitation. - Pulmonary arteries: PA peak pressure: 28 mm Hg (S). - Pericardium, extracardiac: There was no pericardial effusion.  Holter Monitor: 01/2018  Sinus rhythm with occasional PACs and PVCs. There were no significant arrhythmias.  Labs/Other Tests and Data Reviewed:    EKG:  An ECG dated 04/25/2019 was personally reviewed today and demonstrated: NSR, HR 94 with no acute ST changes when compared to prior tracings.   Recent Labs: 01/12/2019: ALT 40; BUN 14; Creatinine, Ser 0.71; Hemoglobin 13.5; Platelets 315; Potassium 3.5; Sodium 137; TSH 1.674   Recent Lipid Panel Lab Results  Component Value Date/Time   CHOL 140 01/12/2019 08:39 AM   TRIG 39 01/12/2019 08:39 AM   HDL 65 01/12/2019 08:39 AM   CHOLHDL 2.2 01/12/2019 08:39 AM   LDLCALC 67 01/12/2019 08:39 AM   LDLCALC 61 10/09/2017  07:06 AM    Wt Readings from Last 3 Encounters:  04/28/19 150 lb (68 kg)  04/28/19 150 lb (68 kg)  04/25/19 152 lb (68.9 kg)  Objective:    Vital Signs:  BP 96/60   Pulse 74   Ht 5' 5.5" (1.664 m)   Wt 150 lb (68 kg)   BMI 24.58 kg/m    General: Pleasant female sounding in NAD Psych: Normal affect. Neuro: Alert and oriented X 3.  Lungs:  Resp regular and unlabored while talking on the phone.   ASSESSMENT & PLAN:    1. Chest Pain with Mixed Typical and Atypical Features - She has a history of chronic left pectoral pain since prior breast augmentation surgery but reports having chest discomfort over the past week which feels different from her chronic symptoms. She describes her most recent pain as a sharp, intense discomfort along her left pectoral region which radiates into her shoulder. Denies any associated symptoms. EKG at the time of ED evaluation showed no acute ST abnormalities. - Her symptoms are atypical in that they can last for hours at a time and can occur at rest. Reviewed options for ischemic evaluation with the patient. Given her chronic neuropathy, I am concerned about her ability to achieve target heart rate on a treadmill as she reports she cannot walk for an extended period of time. Ultimately, I suspect a Coronary CT would be the best option to rule out any obstructive CAD. She did have contrast in the past and reports feeling flushed with this but denies any significant side effects. She wishes to think about testing options and call back to let us know her decision. If she ultimately decides against a Coronary CT, could consider a stress echocardiogram but again there are concerns about being able to achieve target heart rate.  2. Palpitations - she has a history of PAC's and PVC's by prior monitor. She does have an Rx for PRN Lopressor but has not had to utilize this. She does not consume caffeine.   COVID-19 Education: The signs and symptoms of COVID-19 were  discussed with the patient and how to seek care for testing (follow up with PCP or arrange E-visit).  The importance of social distancing was discussed today.  Time:   Today, I have spent 18 minutes with the patient with telehealth technology discussing the above problems.     Medication Adjustments/Labs and Tests Ordered: Current medicines are reviewed at length with the patient today.  Concerns regarding medicines are outlined above.   Tests Ordered: No orders of the defined types were placed in this encounter.   Medication Changes: No orders of the defined types were placed in this encounter.   Follow Up:  Either In Person or Virtual 3-4 months.   Signed, Erma Heritage, PA-C  04/28/2019 11:35 AM    English

## 2019-04-28 NOTE — Progress Notes (Signed)
Virtual Visit via Telephone Note   This visit type was conducted due to national recommendations for restrictions regarding the COVID-19 Pandemic (e.g. social distancing) in an effort to limit this patient's exposure and mitigate transmission in our community.  Due to her co-morbid illnesses, this patient is at least at moderate risk for complications without adequate follow up.  This format is felt to be most appropriate for this patient at this time.  The patient did not have access to video technology/had technical difficulties with video requiring transitioning to audio format only (telephone).  All issues noted in this document were discussed and addressed.  No physical exam could be performed with this format.    Evaluation Performed:  Follow-up visit  Date:  04/28/2019   ID:  Katelyn [name], DOB 1975/10/29, MRN [id number]  Patient Location: Home Provider Location: Office  Location of Patient: Home Location of Provider: Telehealth Consent was obtain for visit to be over via telehealth. I verified that I am speaking with the correct person using two identifiers.  PCP:  Fayrene Helper, MD   Chief Complaint:  Follow up from ER visit   History of Present Illness:    Katelyn [name] is a 43 y.o. female with significant history of tachybradycardia syndrome, varicose veins, migraine, asthma, neuropathy, spondylosis, depression, headaches, generalized anxiety disorder among others.  Presents today for follow-up after going to the emergency room on December 28 of 2020.   She reported sharp and burning chest pain - intermittent since the day before she went in. She thought it was due to chronic pain meds-side effects. Pain was constant - non radiating and located just left of sternum, burning and sharp in nature.  No radiation to the arm / jaw / back.  No sob, no cough, no fevers.   She left before having full work up due to not being able to keep standing. She  reports the ED was full. She also says a covid pt came in and was not wearing his mask.   Reports hx of DVT (?), but never treated, no hx of PE - no hx of ACS , no tob or ETOH use - she was considered low risk. EKG was normal.   Today she reports she thinks it might be from restarting her Topamax back. Her appetite has improved and her CP is reduced. Does not want to be seen unless she starts feeling bad again, and reports she will go to [facility] if needed.   The patient does not have symptoms concerning for COVID-19 infection (fever, chills, cough, or new shortness of breath).   Past Medical, Surgical, Social History, Allergies, and Medications have been Reviewed.  Past Medical History:  Diagnosis Date  . Anemia   . Anorexia   . Anxiety   . Benign juvenile melanoma   . Chronic headaches   . Colon polyps    found on colonoscopy 04/26/2012  . Complication of anesthesia    itching after epidural for c section  . Constipation   . Depression   . Depression    several suicide attempts, hospitaluzed in 2012 for this; hx pf ECT treatments ; pt sees Dr. Adele Schilder pyschiatrist  and doing well on medication  . Headache   . Heart murmur    as a child - no one mentions hearing murmur anymore  . Heartburn    no meds  . History of pneumonia    x 3  years ago - no recent  problems  . Hx of blood clots   . Obesity   . Pneumonia   . Polycystic ovary    takes metformin to treat  . Skin lesion    Excisional biopsy of moles - none cancerous  . Sleep apnea    yrs ago - diagnosed mild sleep apnea - did not have to use cpap    Past Surgical History:  Procedure Laterality Date  . BREAST ENHANCEMENT SURGERY Bilateral 02/11/2017  . BREATH TEK H PYLORI N/A 07/22/2013   Procedure: BREATH TEK H PYLORI;  Surgeon: Edward Jolly, MD;  Location: [facility];  Service: General;  Laterality: N/A;  . c sections  08/28/14   x 2  . CESAREAN SECTION  2004, 2007   x 2  . CYSTOSCOPY WITH URETHRAL DILATATION   age 53   . GASTRIC ROUX-EN-Y N/A 10/31/2013   Procedure: LAPAROSCOPIC ROUX-EN-Y GASTRIC BYPASS WITH UPPER ENDOSCOPY;  Surgeon: Edward Jolly, MD;  Location: [facility];  Service: General;  Laterality: N/A;  . ingrown toenail Right    Ingrown nail on great right toe  . kidney stent  08/28/14  . mole excision     "benign juvenile melanoma" removed from left leg - inner thigh  . ROUX-EN-Y PROCEDURE  08/28/14  . SKIN LESION EXCISION     back  . TONSILLECTOMY    . TONSILLECTOMY  age 25  . TUBAL LIGATION       Current Meds  Medication Sig  . albuterol (VENTOLIN HFA) 108 (90 Base) MCG/ACT inhaler Inhale 2 puffs into the lungs every 6 (six) hours as needed for wheezing or shortness of breath.  . Ascorbic Acid (VITAMIN C) 100 MG tablet Take 100 mg by mouth daily.  . budesonide-formoterol (SYMBICORT) 80-4.5 MCG/ACT inhaler Inhale 2 puffs into the lungs 2 (two) times daily.  Marland Kitchen buPROPion (WELLBUTRIN) 75 MG tablet Take 2 tab in AM  . calcium carbonate (OS-CAL - DOSED IN MG OF ELEMENTAL CALCIUM) 1250 (500 Ca) MG tablet Take 1 tablet by mouth.  . cholecalciferol (VITAMIN D3) 25 MCG (1000 UT) tablet Take 1,000 Units by mouth daily.  . clonazePAM (KLONOPIN) 0.5 MG tablet Take 1 tablet (0.5 mg total) by mouth 2 (two) times daily as needed for anxiety.  . Cyanocobalamin (B-12) 1000 MCG SUBL Place 1,000 mcg under the tongue daily.  Marland Kitchen gabapentin (NEURONTIN) 600 MG tablet Take 600 mg by mouth 3 (three) times daily.  Marland Kitchen HYDROcodone-acetaminophen (NORCO) 7.5-325 MG tablet Take 1 tablet by mouth every 6 (six) hours as needed for moderate pain.  . Multiple Vitamin (MULTIVITAMIN WITH MINERALS) TABS tablet Take 3 tablets by mouth 2 (two) times daily.   . naloxone (NARCAN) nasal spray 4 mg/0.1 mL Place into the nose as needed.   . ondansetron (ZOFRAN) 4 MG tablet Take 1 tablet (4 mg total) by mouth every 8 (eight) hours as needed for nausea or vomiting.  Marland Kitchen PREMARIN vaginal cream 2 times a week  . temazepam (RESTORIL)  22.5 MG capsule Take 1 capsule (22.5 mg total) by mouth at bedtime.  Marland Kitchen tiZANidine (ZANAFLEX) 4 MG tablet Take 4 mg by mouth at bedtime.   . Zinc 100 MG TABS Take 1 tablet by mouth daily.  . ziprasidone (GEODON) 60 MG capsule Take 1 capsule (60 mg total) by mouth at bedtime.     Allergies:   Ciprofloxacin, Duloxetine hcl, Lyrica [pregabalin], Feraheme [ferumoxytol], Penicillins, and Vit b12-methionine-inos-chol   ROS:   Please see the history of present illness.  All other systems reviewed and are negative.   Labs/Other Tests and Data Reviewed:    Recent Labs: 01/12/2019: ALT 40; BUN 14; Creatinine, Ser 0.71; Hemoglobin 13.5; Platelets 315; Potassium 3.5; Sodium 137; TSH 1.674   Recent Lipid Panel Lab Results  Component Value Date/Time   CHOL 140 01/12/2019 08:39 AM   TRIG 39 01/12/2019 08:39 AM   HDL 65 01/12/2019 08:39 AM   CHOLHDL 2.2 01/12/2019 08:39 AM   LDLCALC 67 01/12/2019 08:39 AM   LDLCALC 61 10/09/2017 07:06 AM    Wt Readings from Last 3 Encounters:  04/28/19 150 lb (68 kg)  04/25/19 152 lb (68.9 kg)  04/14/19 151 lb (68.5 kg)     Objective:    Vital Signs:  BP 94/68   Pulse 76   Ht 5' 5.5" (1.664 m)   Wt 150 lb (68 kg)   BMI 24.58 kg/m    VITAL SIGNS:  reviewed GEN:  alert and oriented  RESPIRATORY:  no shortness of breath in conversation PSYCH:  normal affect and mood   ASSESSMENT & PLAN:    1. Intermittent left-sided chest pain  2. Encounter for examination following treatment at hospital   Please see problem list for details.  Time:   Today, I have spent 10 minutes with the patient with telehealth technology discussing the above problems.     Medication Adjustments/Labs and Tests Ordered: Current medicines are reviewed at length with the patient today.  Concerns regarding medicines are outlined above.   Tests Ordered: No orders of the defined types were placed in this encounter.   Medication Changes: No orders of the defined types  were placed in this encounter.   Disposition:  Follow up 05/12/2019  Signed, Perlie Mayo, NP  04/28/2019 10:48 AM     [place]

## 2019-04-28 NOTE — Assessment & Plan Note (Signed)
Recent ED visit-left AMA. Reports improvement.  Advised of S&S on strict return to ED Patient acknowledged agreement and understanding of the plan.

## 2019-04-28 NOTE — Assessment & Plan Note (Signed)
Pt left AMA EKG was clear, concern for PE, but did not stay for full eval due to not having a bed to lay down.  Is doing better. Advised to take all medications as directed.  Advised to return to the emergency room given if she has any signs or symptoms of shortness of breath, chest pain, slurred speech vision changes bleeding decreased appetite fevers chills strict return precautions.  Patient verbalized understanding of this.

## 2019-04-29 HISTORY — PX: COLONOSCOPY: SHX174

## 2019-04-30 ENCOUNTER — Other Ambulatory Visit: Payer: Self-pay | Admitting: Family Medicine

## 2019-05-04 DIAGNOSIS — M6281 Muscle weakness (generalized): Secondary | ICD-10-CM | POA: Diagnosis not present

## 2019-05-04 DIAGNOSIS — R3911 Hesitancy of micturition: Secondary | ICD-10-CM | POA: Diagnosis not present

## 2019-05-04 DIAGNOSIS — M545 Low back pain: Secondary | ICD-10-CM | POA: Diagnosis not present

## 2019-05-04 DIAGNOSIS — M62838 Other muscle spasm: Secondary | ICD-10-CM | POA: Diagnosis not present

## 2019-05-04 DIAGNOSIS — R208 Other disturbances of skin sensation: Secondary | ICD-10-CM | POA: Diagnosis not present

## 2019-05-04 DIAGNOSIS — N3943 Post-void dribbling: Secondary | ICD-10-CM | POA: Diagnosis not present

## 2019-05-09 DIAGNOSIS — F411 Generalized anxiety disorder: Secondary | ICD-10-CM | POA: Diagnosis not present

## 2019-05-09 DIAGNOSIS — F5001 Anorexia nervosa, restricting type: Secondary | ICD-10-CM | POA: Diagnosis not present

## 2019-05-09 DIAGNOSIS — F331 Major depressive disorder, recurrent, moderate: Secondary | ICD-10-CM | POA: Diagnosis not present

## 2019-05-10 ENCOUNTER — Ambulatory Visit (INDEPENDENT_AMBULATORY_CARE_PROVIDER_SITE_OTHER): Payer: PPO | Admitting: Family Medicine

## 2019-05-10 ENCOUNTER — Other Ambulatory Visit: Payer: Self-pay

## 2019-05-10 VITALS — BP 119/56 | HR 75 | Ht 65.5 in | Wt 151.0 lb

## 2019-05-10 DIAGNOSIS — R208 Other disturbances of skin sensation: Secondary | ICD-10-CM | POA: Diagnosis not present

## 2019-05-10 DIAGNOSIS — Z8371 Family history of colonic polyps: Secondary | ICD-10-CM | POA: Diagnosis not present

## 2019-05-10 DIAGNOSIS — N3943 Post-void dribbling: Secondary | ICD-10-CM | POA: Diagnosis not present

## 2019-05-10 DIAGNOSIS — R52 Pain, unspecified: Secondary | ICD-10-CM

## 2019-05-10 DIAGNOSIS — R3911 Hesitancy of micturition: Secondary | ICD-10-CM | POA: Diagnosis not present

## 2019-05-10 DIAGNOSIS — M62838 Other muscle spasm: Secondary | ICD-10-CM | POA: Diagnosis not present

## 2019-05-10 DIAGNOSIS — L97529 Non-pressure chronic ulcer of other part of left foot with unspecified severity: Secondary | ICD-10-CM

## 2019-05-10 DIAGNOSIS — M545 Low back pain: Secondary | ICD-10-CM | POA: Diagnosis not present

## 2019-05-10 DIAGNOSIS — F333 Major depressive disorder, recurrent, severe with psychotic symptoms: Secondary | ICD-10-CM

## 2019-05-10 DIAGNOSIS — M6281 Muscle weakness (generalized): Secondary | ICD-10-CM | POA: Diagnosis not present

## 2019-05-10 NOTE — Assessment & Plan Note (Addendum)
2 week history, reports that this is a complication of a medical conditon which she has, refer to Podiatry for eval and management, soonest available

## 2019-05-10 NOTE — Progress Notes (Signed)
Virtual Visit via Telephone Note  I connected with Katelyn [name] on 05/10/19 at  3:00 PM EST by telephone and verified that I am speaking with the correct person using two identifiers.  Location: Patient: home Provider:office  I discussed the limitations, risks, security and privacy concerns of performing an evaluation and management service by telephone and the availability of in person appointments. I also discussed with the patient that there may be a patient responsible charge related to this service. The patient expressed understanding and agreed to proceed.   History of Present Illness: 2 week h/o beet red  Dime sized area on pad of of left middle toe, dx with erythromelalgia and this is a reported potential complication. Stays at home all the time , managed not to get covid from her spouse, and is postponing all in person services due to covid, she is aware that her colonoscopy iss overdue, still keeping o hold Anxious to have breast implants removed as she believes they are the source of a lot of her health problems   Observations/Objective: BP (!) 119/56   Pulse 75   Ht 5' 5.5" (1.664 m)   Wt 151 lb (68.5 kg)   BMI 24.75 kg/m  Good communication with no confusion and intact memory. Alert and oriented x 3 No signs of respiratory distress during speech   Assessment and Plan:  Ulcer of toe of left foot (HCC) 2 week history, reports that this is a complication of a medical conditon which she has, refer to Podiatry for eval and management, soonest available  Generalized pain Treated by pain clinic and adequatwly managed  Family hx colonic polyps Reminded of her need for a rept colonoscopy  Depression, major, recurrent, severe with psychosis controlled and treated by Psychiatry    Follow Up Instructions:    I discussed the assessment and treatment plan with the patient. The patient was provided an opportunity to ask questions and all were answered. The  patient agreed with the plan and demonstrated an understanding of the instructions.   The patient was advised to call back or seek an in-person evaluation if the symptoms worsen or if the condition fails to improve as anticipated.  I provided 12  minutes of non-face-to-face time during this encounter.   [name], MD

## 2019-05-10 NOTE — Patient Instructions (Addendum)
F/u with MD in 5 months, call if you need me sooner  You are referred to [facility] re toe ulcer   Best for 2021!

## 2019-05-12 ENCOUNTER — Ambulatory Visit: Payer: PPO | Admitting: Family Medicine

## 2019-05-16 ENCOUNTER — Encounter: Payer: Self-pay | Admitting: Family Medicine

## 2019-05-16 NOTE — Assessment & Plan Note (Signed)
Reminded of her need for a rept colonoscopy

## 2019-05-16 NOTE — Assessment & Plan Note (Signed)
Treated by pain clinic and adequatwly managed

## 2019-05-16 NOTE — Assessment & Plan Note (Signed)
controlled and treated by Psychiatry

## 2019-05-19 ENCOUNTER — Encounter (HOSPITAL_COMMUNITY): Payer: Self-pay | Admitting: *Deleted

## 2019-05-19 DIAGNOSIS — F5001 Anorexia nervosa, restricting type: Secondary | ICD-10-CM | POA: Diagnosis not present

## 2019-05-19 DIAGNOSIS — F331 Major depressive disorder, recurrent, moderate: Secondary | ICD-10-CM | POA: Diagnosis not present

## 2019-05-19 DIAGNOSIS — F411 Generalized anxiety disorder: Secondary | ICD-10-CM | POA: Diagnosis not present

## 2019-05-19 NOTE — Telephone Encounter (Signed)
Patient having increase anxiety in past two weeks. Not sure why. No specific triggers. Rec to take klonopin 3 times a day and try for week. If no improvement than call us back. She has enough klonopin.

## 2019-05-19 NOTE — Telephone Encounter (Signed)
She is not on ADHD meds.

## 2019-05-19 NOTE — Telephone Encounter (Signed)
error 

## 2019-05-19 NOTE — Telephone Encounter (Signed)
Received message from pt that she has spoken with her therapist and was encouraged to speak with you directly regarding increased anxiety.   buPROPion (WELLBUTRIN) 75 MG tablet  2 ordered        Summary: Take 2 tab in AM, Normal     clonazePAM (KLONOPIN) 0.5 MG tablet 0.5 mg, 2 times daily PRN 1 ordered        Dose, Route, Frequency: 0.5 mg, Oral, 2 times daily PRN Start: 03/29/2019 End: 10/03/2022     gabapentin (NEURONTIN) 600 MG tablet 600 mg, 3 times daily         Dose, Route, Frequency: 600 mg, Oral, 3 times daily Ord/Sold: 01/12/2018 (O) Report     temazepam (RESTORIL) 22.5 MG capsule 22.5 mg, Daily at bedtime 2 ordered        Summary: Take 1 capsule (22.5 mg total) by mouth at bedtime., Starting Tue 03/29/2019, Normal     ziprasidone (GEODON) 60 MG capsule 60 mg, Daily at bedtime 2 ordered        Summary: Take 1 capsule (60 mg total) by mouth at bedtime., Starting Tue 03/29/2019, Normal

## 2019-05-23 ENCOUNTER — Telehealth (HOSPITAL_COMMUNITY): Payer: Self-pay | Admitting: *Deleted

## 2019-05-23 DIAGNOSIS — F411 Generalized anxiety disorder: Secondary | ICD-10-CM | POA: Diagnosis not present

## 2019-05-23 DIAGNOSIS — F331 Major depressive disorder, recurrent, moderate: Secondary | ICD-10-CM | POA: Diagnosis not present

## 2019-05-23 DIAGNOSIS — F5001 Anorexia nervosa, restricting type: Secondary | ICD-10-CM | POA: Diagnosis not present

## 2019-05-23 NOTE — Telephone Encounter (Signed)
Writer spoke with pt to touch base after c/o increased anxiety, and subsequent increase in Klonopin 0.5mg  to tid prn. Pt says she's "still anxious" but that she's "much better" with the increase. Pt is encouraged to call with any questions or concerns.

## 2019-05-24 DIAGNOSIS — R3911 Hesitancy of micturition: Secondary | ICD-10-CM | POA: Diagnosis not present

## 2019-05-24 DIAGNOSIS — M545 Low back pain: Secondary | ICD-10-CM | POA: Diagnosis not present

## 2019-05-24 DIAGNOSIS — M62838 Other muscle spasm: Secondary | ICD-10-CM | POA: Diagnosis not present

## 2019-05-24 DIAGNOSIS — R208 Other disturbances of skin sensation: Secondary | ICD-10-CM | POA: Diagnosis not present

## 2019-05-24 DIAGNOSIS — M6281 Muscle weakness (generalized): Secondary | ICD-10-CM | POA: Diagnosis not present

## 2019-05-24 DIAGNOSIS — N3943 Post-void dribbling: Secondary | ICD-10-CM | POA: Diagnosis not present

## 2019-05-25 ENCOUNTER — Ambulatory Visit: Payer: PPO | Admitting: Podiatry

## 2019-05-26 DIAGNOSIS — G894 Chronic pain syndrome: Secondary | ICD-10-CM | POA: Diagnosis not present

## 2019-05-26 DIAGNOSIS — M533 Sacrococcygeal disorders, not elsewhere classified: Secondary | ICD-10-CM | POA: Diagnosis not present

## 2019-05-30 DIAGNOSIS — F411 Generalized anxiety disorder: Secondary | ICD-10-CM | POA: Diagnosis not present

## 2019-05-30 DIAGNOSIS — F331 Major depressive disorder, recurrent, moderate: Secondary | ICD-10-CM | POA: Diagnosis not present

## 2019-05-30 DIAGNOSIS — F5001 Anorexia nervosa, restricting type: Secondary | ICD-10-CM | POA: Diagnosis not present

## 2019-06-08 DIAGNOSIS — N3943 Post-void dribbling: Secondary | ICD-10-CM | POA: Diagnosis not present

## 2019-06-08 DIAGNOSIS — R208 Other disturbances of skin sensation: Secondary | ICD-10-CM | POA: Diagnosis not present

## 2019-06-08 DIAGNOSIS — R3911 Hesitancy of micturition: Secondary | ICD-10-CM | POA: Diagnosis not present

## 2019-06-08 DIAGNOSIS — M545 Low back pain: Secondary | ICD-10-CM | POA: Diagnosis not present

## 2019-06-08 DIAGNOSIS — M6281 Muscle weakness (generalized): Secondary | ICD-10-CM | POA: Diagnosis not present

## 2019-06-08 DIAGNOSIS — M62838 Other muscle spasm: Secondary | ICD-10-CM | POA: Diagnosis not present

## 2019-06-15 DIAGNOSIS — Z9884 Bariatric surgery status: Secondary | ICD-10-CM | POA: Diagnosis not present

## 2019-06-15 DIAGNOSIS — R1084 Generalized abdominal pain: Secondary | ICD-10-CM | POA: Diagnosis not present

## 2019-06-15 DIAGNOSIS — R140 Abdominal distension (gaseous): Secondary | ICD-10-CM | POA: Diagnosis not present

## 2019-06-16 DIAGNOSIS — F411 Generalized anxiety disorder: Secondary | ICD-10-CM | POA: Diagnosis not present

## 2019-06-16 DIAGNOSIS — F331 Major depressive disorder, recurrent, moderate: Secondary | ICD-10-CM | POA: Diagnosis not present

## 2019-06-16 DIAGNOSIS — F5001 Anorexia nervosa, restricting type: Secondary | ICD-10-CM | POA: Diagnosis not present

## 2019-06-18 ENCOUNTER — Other Ambulatory Visit: Payer: Self-pay | Admitting: Surgery

## 2019-06-18 DIAGNOSIS — Z9884 Bariatric surgery status: Secondary | ICD-10-CM

## 2019-06-21 DIAGNOSIS — F509 Eating disorder, unspecified: Secondary | ICD-10-CM | POA: Diagnosis not present

## 2019-06-21 DIAGNOSIS — Z9884 Bariatric surgery status: Secondary | ICD-10-CM | POA: Diagnosis not present

## 2019-06-22 DIAGNOSIS — M545 Low back pain: Secondary | ICD-10-CM | POA: Diagnosis not present

## 2019-06-22 DIAGNOSIS — R208 Other disturbances of skin sensation: Secondary | ICD-10-CM | POA: Diagnosis not present

## 2019-06-22 DIAGNOSIS — M62838 Other muscle spasm: Secondary | ICD-10-CM | POA: Diagnosis not present

## 2019-06-22 DIAGNOSIS — M6281 Muscle weakness (generalized): Secondary | ICD-10-CM | POA: Diagnosis not present

## 2019-06-22 DIAGNOSIS — N3943 Post-void dribbling: Secondary | ICD-10-CM | POA: Diagnosis not present

## 2019-06-22 DIAGNOSIS — R3911 Hesitancy of micturition: Secondary | ICD-10-CM | POA: Diagnosis not present

## 2019-06-23 DIAGNOSIS — F411 Generalized anxiety disorder: Secondary | ICD-10-CM | POA: Diagnosis not present

## 2019-06-23 DIAGNOSIS — F5001 Anorexia nervosa, restricting type: Secondary | ICD-10-CM | POA: Diagnosis not present

## 2019-06-23 DIAGNOSIS — F331 Major depressive disorder, recurrent, moderate: Secondary | ICD-10-CM | POA: Diagnosis not present

## 2019-06-27 ENCOUNTER — Ambulatory Visit (INDEPENDENT_AMBULATORY_CARE_PROVIDER_SITE_OTHER): Payer: PPO | Admitting: Psychiatry

## 2019-06-27 ENCOUNTER — Encounter (HOSPITAL_COMMUNITY): Payer: Self-pay | Admitting: Psychiatry

## 2019-06-27 ENCOUNTER — Other Ambulatory Visit: Payer: Self-pay

## 2019-06-27 DIAGNOSIS — F411 Generalized anxiety disorder: Secondary | ICD-10-CM

## 2019-06-27 DIAGNOSIS — F331 Major depressive disorder, recurrent, moderate: Secondary | ICD-10-CM

## 2019-06-27 DIAGNOSIS — F509 Eating disorder, unspecified: Secondary | ICD-10-CM

## 2019-06-27 MED ORDER — CLONAZEPAM 0.5 MG PO TABS: 0.5000 mg | ORAL_TABLET | Freq: Three times a day (TID) | ORAL | 1 refills | Status: DC | PRN

## 2019-06-27 MED ORDER — BUPROPION HCL 75 MG PO TABS: ORAL_TABLET | ORAL | 2 refills | Status: DC

## 2019-06-27 MED ORDER — ZIPRASIDONE HCL 60 MG PO CAPS: 60.0000 mg | ORAL_CAPSULE | Freq: Every day | ORAL | 2 refills | Status: DC

## 2019-06-27 MED ORDER — TEMAZEPAM 30 MG PO CAPS: 30.0000 mg | ORAL_CAPSULE | Freq: Every day | ORAL | 2 refills | Status: DC

## 2019-06-27 NOTE — Progress Notes (Signed)
Virtual Visit via Telephone Note  I connected with Katelyn [name] on 06/27/19 at  8:20 AM EST by telephone and verified that I am speaking with the correct person using two identifiers.   I discussed the limitations, risks, security and privacy concerns of performing an evaluation and management service by telephone and the availability of in person appointments. I also discussed with the patient that there may be a patient responsible charge related to this service. The patient expressed understanding and agreed to proceed.   History of Present Illness: Patient was evaluated by phone session.  She is is experiencing increased anxiety and nervousness.  Recently father diagnosed with cancer of eyelids he may need reconstruction surgery.  She also have nausea and recently seen bariatric physician.  She continued to endorse a lot of anxiety about her physical condition.  Recently we recommended to try Klonopin 3 times a day which she took and help some of her anxiety but her biggest problem is now lack of sleep.  She is only sleeping 3 to 4 hours.  She reported having racing thoughts.  She is scheduled to see her neurologist in March for her chronic numbness.  She is in therapy with therapist Gywn for past 1 year since pandemic she was unable to see [facility].  She reported her therapy is helping her eating disorder and she does not have any recent purging or restricting.  Her weight is stable.  However she feels that she need to go back to [facility] which was more helpful to address her anxiety.  She feel her eating disorder is stable for past few years.  She denies any suicidal thoughts.  She denies any anger, mood swing.  She like to come off from [place] in the future and she realized that she is sleep better she can cut down the Klonopin.  She denies any suicidal thoughts.  Past Psychiatric History:Reviewed. H/Omultiple inpatient. Last admission Feb 2015. Good response with ECT but scared to  continue maintenance ECT. Tried Cymbalta (suicidal thoughts), Lexapro, Abilify, lithium, Wellbutrin, Lamictal, Ritalin, Remeron, Risperdal, Neurontin, Vistaril, BuSpar, Prozac and Valium.   Psychiatric Specialty Exam: Physical Exam  Review of Systems  Neurological: Positive for numbness.    There were no vitals taken for this visit.There is no height or weight on file to calculate BMI.  General Appearance: NA  Eye Contact:  NA  Speech:  Slow  Volume:  Decreased  Mood:  Anxious and Dysphoric  Affect:  NA  Thought Process:  Descriptions of Associations: Intact  Orientation:  Full (Time, Place, and Person)  Thought Content:  Rumination  Suicidal Thoughts:  No  Homicidal Thoughts:  No  Memory:  Immediate;   Good Recent;   Good Remote;   Good  Judgement:  Intact  Insight:  Present  Psychomotor Activity:  NA  Concentration:  Concentration: Fair and Attention Span: Fair  Recall:  Good  Fund of Knowledge:  Good  Language:  Good  Akathisia:  No  Handed:  Right  AIMS (if indicated):     Assets:  Communication Skills Desire for Improvement Housing Resilience Social Support  ADL's:  Intact  Cognition:  WNL  Sleep:   3-4 hrs      Assessment and Plan: Generalized anxiety disorder.  Major depressive disorder, recurrent.  Eating disorder NOS.  I discussed since her eating disorder is stable she may consider going back to [place] who was helping her anxiety.  Patient agreed and she will think about it.  I also recommend to try temazepam 30 mg at bedtime so she can sleep better and if she feels less anxious then she can cut down her Klonopin to take only as needed.  Currently it is prescribed 0.5 mg 3 times a day.  Her paranoia is under control with Geodon and I will continue Geodon 60 mg at bedtime.  She will also continue Wellbutrin 75 mg 2 tablets twice a day.  Encouraged to keep appointment with neurologist and pain specialist.  She is getting pain medication and gabapentin for  chronic tingling and numbness which started after surgery.  Recommended to call us back if she has any question or any concern.  Follow-up in 3 months.  Follow Up Instructions:    I discussed the assessment and treatment plan with the patient. The patient was provided an opportunity to ask questions and all were answered. The patient agreed with the plan and demonstrated an understanding of the instructions.   The patient was advised to call back or seek an in-person evaluation if the symptoms worsen or if the condition fails to improve as anticipated.  I provided 20 minutes of non-face-to-face time during this encounter.   Kathlee Nations, MD

## 2019-06-29 NOTE — Telephone Encounter (Signed)
Dr. Loanne Drilling, please see pt's mychart message and advise on it for pt. Thanks!

## 2019-06-30 DIAGNOSIS — F5001 Anorexia nervosa, restricting type: Secondary | ICD-10-CM | POA: Diagnosis not present

## 2019-06-30 DIAGNOSIS — F331 Major depressive disorder, recurrent, moderate: Secondary | ICD-10-CM | POA: Diagnosis not present

## 2019-06-30 DIAGNOSIS — F411 Generalized anxiety disorder: Secondary | ICD-10-CM | POA: Diagnosis not present

## 2019-07-01 ENCOUNTER — Ambulatory Visit
Admission: RE | Admit: 2019-07-01 | Discharge: 2019-07-01 | Disposition: A | Discharge status: Home / Self Care | Payer: PPO | Source: Ambulatory Visit | Attending: Surgery | Admitting: Surgery

## 2019-07-01 DIAGNOSIS — R109 Unspecified abdominal pain: Secondary | ICD-10-CM | POA: Diagnosis not present

## 2019-07-01 DIAGNOSIS — Z9884 Bariatric surgery status: Secondary | ICD-10-CM

## 2019-07-01 NOTE — Telephone Encounter (Signed)
What local vaccination locations are available? Can we call and see what facilities are equipped to manage possible anaphylaxis?  Rodman Pickle, M.D. [facility] Pulmonary/Critical Care Medicine 07/01/2019 6:42 PM

## 2019-07-13 DIAGNOSIS — Z9884 Bariatric surgery status: Secondary | ICD-10-CM | POA: Diagnosis not present

## 2019-07-14 DIAGNOSIS — R208 Other disturbances of skin sensation: Secondary | ICD-10-CM | POA: Diagnosis not present

## 2019-07-14 DIAGNOSIS — M6281 Muscle weakness (generalized): Secondary | ICD-10-CM | POA: Diagnosis not present

## 2019-07-14 DIAGNOSIS — R3911 Hesitancy of micturition: Secondary | ICD-10-CM | POA: Diagnosis not present

## 2019-07-14 DIAGNOSIS — M62838 Other muscle spasm: Secondary | ICD-10-CM | POA: Diagnosis not present

## 2019-07-14 DIAGNOSIS — F411 Generalized anxiety disorder: Secondary | ICD-10-CM | POA: Diagnosis not present

## 2019-07-14 DIAGNOSIS — F5001 Anorexia nervosa, restricting type: Secondary | ICD-10-CM | POA: Diagnosis not present

## 2019-07-14 DIAGNOSIS — M545 Low back pain: Secondary | ICD-10-CM | POA: Diagnosis not present

## 2019-07-14 DIAGNOSIS — N3943 Post-void dribbling: Secondary | ICD-10-CM | POA: Diagnosis not present

## 2019-07-14 DIAGNOSIS — Z23 Encounter for immunization: Secondary | ICD-10-CM | POA: Diagnosis not present

## 2019-07-14 DIAGNOSIS — F331 Major depressive disorder, recurrent, moderate: Secondary | ICD-10-CM | POA: Diagnosis not present

## 2019-07-14 NOTE — Telephone Encounter (Signed)
Opened in error

## 2019-07-15 ENCOUNTER — Encounter: Payer: Self-pay | Admitting: Family Medicine

## 2019-07-15 ENCOUNTER — Other Ambulatory Visit: Payer: Self-pay | Admitting: Family Medicine

## 2019-07-15 ENCOUNTER — Telehealth (HOSPITAL_COMMUNITY): Payer: Self-pay | Admitting: *Deleted

## 2019-07-15 ENCOUNTER — Other Ambulatory Visit (HOSPITAL_COMMUNITY): Payer: Self-pay | Admitting: Nurse Practitioner

## 2019-07-15 DIAGNOSIS — D508 Other iron deficiency anemias: Secondary | ICD-10-CM

## 2019-07-15 DIAGNOSIS — D126 Benign neoplasm of colon, unspecified: Secondary | ICD-10-CM

## 2019-07-15 NOTE — Progress Notes (Signed)
Ab

## 2019-07-15 NOTE — Telephone Encounter (Signed)
Pt called into clinic asking if her labs can be drawn at [place], NP electronically sent the lab orders to [facility].

## 2019-07-19 DIAGNOSIS — G608 Other hereditary and idiopathic neuropathies: Secondary | ICD-10-CM | POA: Diagnosis not present

## 2019-07-19 DIAGNOSIS — M7918 Myalgia, other site: Secondary | ICD-10-CM | POA: Diagnosis not present

## 2019-07-19 DIAGNOSIS — R300 Dysuria: Secondary | ICD-10-CM | POA: Diagnosis not present

## 2019-07-19 DIAGNOSIS — I824Y3 Acute embolism and thrombosis of unspecified deep veins of proximal lower extremity, bilateral: Secondary | ICD-10-CM | POA: Diagnosis not present

## 2019-07-19 DIAGNOSIS — N760 Acute vaginitis: Secondary | ICD-10-CM | POA: Diagnosis not present

## 2019-07-19 DIAGNOSIS — B373 Candidiasis of vulva and vagina: Secondary | ICD-10-CM | POA: Diagnosis not present

## 2019-07-25 ENCOUNTER — Other Ambulatory Visit (HOSPITAL_COMMUNITY): Payer: Self-pay | Admitting: Psychiatry

## 2019-07-25 ENCOUNTER — Ambulatory Visit (INDEPENDENT_AMBULATORY_CARE_PROVIDER_SITE_OTHER): Payer: PPO | Admitting: Pulmonary Disease

## 2019-07-25 DIAGNOSIS — J45909 Unspecified asthma, uncomplicated: Secondary | ICD-10-CM

## 2019-07-25 DIAGNOSIS — F411 Generalized anxiety disorder: Secondary | ICD-10-CM

## 2019-07-25 MED ORDER — BUDESONIDE-FORMOTEROL FUMARATE 80-4.5 MCG/ACT IN AERO: 2.0000 | INHALATION_SPRAY | Freq: Two times a day (BID) | RESPIRATORY_TRACT | 5 refills | Status: DC

## 2019-07-25 NOTE — Progress Notes (Signed)
Virtual Visit via Video Note  I connected with Katelyn [name] on 07/25/19 at  9:00 AM EDT by a video enabled telemedicine application and verified that I am speaking with the correct person using two identifiers.  Location: Patient: Home Provider: [facility]   I discussed the limitations of evaluation and management by telemedicine and the availability of in person appointments. The patient expressed understanding and agreed to proceed.   I discussed the assessment and treatment plan with the patient. The patient was provided an opportunity to ask questions and all were answered. The patient agreed with the plan and demonstrated an understanding of the instructions.   The patient was advised to call back or seek an in-person evaluation if the symptoms worsen or if the condition fails to improve as anticipated.  I provided 22 minutes of non-face-to-face time during this encounter.   Vianca Bracher Rodman Pickle, MD    Subjective:   PATIENT ID: Katelyn [name] GENDER: female DOB: 1975/07/13, MRN: [id number]   HPI  No chief complaint on file.  Reason for Visit: Follow-up for dyspnea  [name] is a 44 year old female with history of tachybradycardia syndrome, depression, anxiety, migraine, PCOS and small fiber neuropathy who presents for follow-up  She was initially evaluated by me on 11/22/2018.  Since 2019 after an exposure to bug spray, she had burning sensation in her lungs.  On exposures to weed killer in the fall time, she has had recurrent burning sensation in her lungs difficulty getting air in.  Associated with change in taste and smell.  On our initial visit she reported shortness of breath and chest "burning" when exposed to certain irritants in the last year. She reports that has been a persistent burning sensation that took 4-5 months before gradually improving however this spring, this symptom recurred with increased intensity when husband was  putting down fertilizer in the yard. Now any irritant including laundry detergent, shampoos, hand sanitizer, deodarants and any scented products. If she removes the irritant (washing hands, removing clothes), it will take 30-60 minutes for the intensity of the burning will subside. She describes the irritation located in the throat and deep irritation feeling like it is in the lungs. However she has a constant low-level burn. She has switched to unscented products and this has helped.  She does not thing her dyspnea is not necessarily associated with this burning sensation. She has had shortness of breath for the last year. This worsens with exertion including ambulating in the house. She believes due to her deconditioning since her breast implant in October 2018 where afterwards she had severe chronic pain from unclear etiology. She has been evaluated for [facility] and recommended management of symptoms as work-up negative per patient.   Interval History Since her last video visit on 04/18/2019, she has been compliant with her Symbicort 1 puff twice a day. She continues to avoid triggers and has not had any .  She rarely uses her albuterol inhaler.  She has received Moderna Covid vaccine on 3/18. Two days after vaccine, she had flu-like symptoms. She did require albuterol inhaler for two days for shortness of breath that occurred with exertion during her physical therapy. No wheezing or cough. No further episodes of dyspnea since then. Overall, she feels her symptoms are well-controlled on Symbicort. This is the same time last year she initially developed symptoms so would like to continue to use Symbicort but would be interested in cutting back inhaler use when able.  ACT  14  Social History: Former smoker.  5-pack-year history.  Quit in [place] exposures: As above  I have personally reviewed patient's past medical/family/social history/allergies/current medications.  Past Medical History:   Diagnosis Date  . Anemia   . Anorexia   . Anxiety   . Benign juvenile melanoma   . Chronic headaches   . Colon polyps    found on colonoscopy 04/26/2012  . Complication of anesthesia    itching after epidural for c section  . Constipation   . Depression   . Depression    several suicide attempts, hospitaluzed in 2012 for this; hx pf ECT treatments ; pt sees Dr. Adele Schilder pyschiatrist  and doing well on medication  . Headache   . Heart murmur    as a child - no one mentions hearing murmur anymore  . Heartburn    no meds  . History of pneumonia    x 3  years ago - no recent problems  . Hx of blood clots   . Obesity   . Pneumonia   . Polycystic ovary    takes metformin to treat  . Skin lesion    Excisional biopsy of moles - none cancerous  . Sleep apnea    yrs ago - diagnosed mild sleep apnea - did not have to use cpap     Allergies  Allergen Reactions  . Ciprofloxacin Other (See Comments)    Nerve pain  Nerve pain  Nerve pain   . Duloxetine Hcl Anaphylaxis    Make her more depressed, having nausea, vomiting, headache and threw up. Make her more depressed, having nausea, vomiting, headache and threw up.  Recardo Evangelist [Pregabalin] Anaphylaxis  . Feraheme [Ferumoxytol] Hives  . Penicillins     Has patient had a PCN reaction causing immediate rash, facial/tongue/throat swelling, SOB or lightheadedness with hypotension: Yes Has patient had a PCN reaction causing severe rash involving mucus membranes or skin necrosis: No Has patient had a PCN reaction that required hospitalization No Has patient had a PCN reaction occurring within the last 10 years: No If all of the above answers are "NO", then may proceed with Cephalosporin use.     REACTION: Rash  . Vit B12-Methionine-Inos-Chol      Outpatient Medications Prior to Visit  Medication Sig Dispense Refill  . albuterol (VENTOLIN HFA) 108 (90 Base) MCG/ACT inhaler Inhale 2 puffs into the lungs every 6 (six) hours as needed for  wheezing or shortness of breath. 8 g 6  . Ascorbic Acid (VITAMIN C) 100 MG tablet Take 100 mg by mouth daily.    . budesonide-formoterol (SYMBICORT) 80-4.5 MCG/ACT inhaler Inhale 2 puffs into the lungs 2 (two) times daily. 6.9 g 5  . buPROPion (WELLBUTRIN) 75 MG tablet Take 2 tab in AM 60 tablet 2  . calcium carbonate (OS-CAL - DOSED IN MG OF ELEMENTAL CALCIUM) 1250 (500 Ca) MG tablet Take 1 tablet by mouth.    . cholecalciferol (VITAMIN D3) 25 MCG (1000 UT) tablet Take 1,000 Units by mouth daily.    . clonazePAM (KLONOPIN) 0.5 MG tablet Take 1 tablet (0.5 mg total) by mouth 3 (three) times daily as needed for anxiety. 90 tablet 1  . Cyanocobalamin (B-12) 1000 MCG SUBL Place 1,000 mcg under the tongue daily. 30 each 6  . gabapentin (NEURONTIN) 600 MG tablet Take 600 mg by mouth 3 (three) times daily.    Marland Kitchen HYDROcodone-acetaminophen (NORCO) 7.5-325 MG tablet Take 1 tablet by mouth every 6 (six) hours  as needed for moderate pain.    . metoprolol tartrate (LOPRESSOR) 25 MG tablet Take 0.5 tablets (12.5 mg total) by mouth 2 (two) times daily as needed. 180 tablet 3  . Multiple Vitamin (MULTIVITAMIN WITH MINERALS) TABS tablet Take 3 tablets by mouth 2 (two) times daily.     . naloxone (NARCAN) nasal spray 4 mg/0.1 mL Place into the nose as needed.     . ondansetron (ZOFRAN) 4 MG tablet TAKE 1 TABLET BY MOUTH EVERY 8 HOURS AS NEEDED FOR NAUSEA OR VOMITING. 20 tablet 0  . PREMARIN vaginal cream 2 times a week    . temazepam (RESTORIL) 30 MG capsule Take 1 capsule (30 mg total) by mouth at bedtime. 30 capsule 2  . tiZANidine (ZANAFLEX) 4 MG tablet Take 4 mg by mouth at bedtime.     . topiramate (TOPAMAX) 50 MG tablet Take 50 mg by mouth daily.    . Zinc 100 MG TABS Take 1 tablet by mouth daily.    . ziprasidone (GEODON) 60 MG capsule Take 1 capsule (60 mg total) by mouth at bedtime. 30 capsule 2   No facility-administered medications prior to visit.    Review of Systems  Constitutional: Negative for  chills, diaphoresis, fever, malaise/fatigue and weight loss.  HENT: Negative for congestion.   Respiratory: Negative for cough, hemoptysis, sputum production, shortness of breath and wheezing.   Cardiovascular: Negative for chest pain, palpitations and leg swelling.    Objective:   There were no vitals filed for this visit.   Physical Exam: No apparent distress on the phone. No audible wheezing or shortness of breath. Able to speak full sentences  Data Reviewed:  Imaging: CXR 08/04/2017-no acute cardiopulmonary abnormalities.  Mild hyperinflation without flattening of the diaphragms CXR 12/09/2016-mild hyperinflation.  No infiltrates, effusion or edema.  PFT: None on file  Labs: CBC and CMP 11/02/2018-unremarkable.  Eosinophils 100    Assessment & Plan:  Discussion: 44 year old female who presents for follow-up for presumed irritant induced asthma. Tolerating low-dose ICS/LABA. Will continue Symbicort through the spring. We discussed de-escalating Symbicort during this summer when outdoor irritants are less rampant. She can start taking Symbicort as needed however would recommend restarting if she redevelops symptoms as previously described (burning and shortness of breath).   Irritant-induced asthma  Medications:  CONTINUE Symbicort 80-4.5 mcg up to ONE puff twice a day.    CONTINUE Albuterol 2 puffs as needed every 4-6 hours for shortness of breath or wheezing. THIS IS RESCUE INHALER   CONTINUE to minimize exposures  Asthma Action Plan:  Take Albuterol as needed for shortness of breath. If symptoms persistent including coughing, shortness of breath or wheezing, please call office to consider short-course of steroids.   [place] to start cutting back on Symbicort.  Take ONE puff of Symbicort AS NEEDED for shortness of breath and wheezing.  If symptoms persist after 48-72 hours, I recommend restarting Symbicort ONE puff twice a day again  Please call our office for any  concerns or questions   Health Maintenance Immunization History  Administered Date(s) Administered  . Influenza Split 03/03/2012, 02/18/2015  . Influenza,inj,Quad PF,6+ Mos 01/31/2016, 03/03/2017, 01/13/2018, 01/04/2019  . PPD Test 12/27/2014  . Tdap 03/31/2011   No orders of the defined types were placed in this encounter.  Meds ordered this encounter  Medications  . budesonide-formoterol (SYMBICORT) 80-4.5 MCG/ACT inhaler    Sig: Inhale 2 puffs into the lungs 2 (two) times daily.    Dispense:  1 Inhaler    Refill:  5    Order Specific Question:   Lot Number?    AnswerLF:[id number]    Order Specific Question:   Expiration Date?    Answer:   11/27/2019    Order Specific Question:   Manufacturer?    Answer:   [employer] [[id number]]    Order Specific Question:   Quantity    Answer:   1   Return in about 6 months (around 01/25/2020) for with me.   [place], MD [facility] 07/25/2019 8:43 AM  Office Number [phone]

## 2019-07-25 NOTE — Patient Instructions (Addendum)
Irritant-induced asthma  Medications:  CONTINUE Symbicort 80-4.5 mcg up to ONE puff twice a day.    CONTINUE Albuterol 2 puffs as needed every 4-6 hours for shortness of breath or wheezing. THIS IS RESCUE INHALER   CONTINUE to minimize exposures  Asthma Action Plan:  Take Albuterol as needed for shortness of breath. If symptoms persistent including coughing, shortness of breath or wheezing, please call office to consider short-course of steroids.    [place] to start cutting back on Symbicort.  Take ONE puff of Symbicort AS NEEDED for shortness of breath and wheezing.  If symptoms persist after 48-72 hours, I recommend restarting Symbicort ONE puff twice a day again  Please call our office for any concerns or questions  Follow-up with me in 6 months

## 2019-07-27 ENCOUNTER — Ambulatory Visit: Payer: PPO | Admitting: Cardiovascular Disease

## 2019-07-28 DIAGNOSIS — F411 Generalized anxiety disorder: Secondary | ICD-10-CM | POA: Diagnosis not present

## 2019-07-28 DIAGNOSIS — F331 Major depressive disorder, recurrent, moderate: Secondary | ICD-10-CM | POA: Diagnosis not present

## 2019-07-28 DIAGNOSIS — F5001 Anorexia nervosa, restricting type: Secondary | ICD-10-CM | POA: Diagnosis not present

## 2019-07-29 DIAGNOSIS — R208 Other disturbances of skin sensation: Secondary | ICD-10-CM | POA: Diagnosis not present

## 2019-07-29 DIAGNOSIS — M545 Low back pain: Secondary | ICD-10-CM | POA: Diagnosis not present

## 2019-07-29 DIAGNOSIS — M6281 Muscle weakness (generalized): Secondary | ICD-10-CM | POA: Diagnosis not present

## 2019-07-29 DIAGNOSIS — M62838 Other muscle spasm: Secondary | ICD-10-CM | POA: Diagnosis not present

## 2019-07-29 DIAGNOSIS — R3911 Hesitancy of micturition: Secondary | ICD-10-CM | POA: Diagnosis not present

## 2019-07-29 DIAGNOSIS — N3943 Post-void dribbling: Secondary | ICD-10-CM | POA: Diagnosis not present

## 2019-08-08 DIAGNOSIS — F331 Major depressive disorder, recurrent, moderate: Secondary | ICD-10-CM | POA: Diagnosis not present

## 2019-08-08 DIAGNOSIS — F5001 Anorexia nervosa, restricting type: Secondary | ICD-10-CM | POA: Diagnosis not present

## 2019-08-08 DIAGNOSIS — F411 Generalized anxiety disorder: Secondary | ICD-10-CM | POA: Diagnosis not present

## 2019-08-18 ENCOUNTER — Ambulatory Visit (HOSPITAL_COMMUNITY): Payer: PPO | Admitting: Nurse Practitioner

## 2019-08-23 ENCOUNTER — Encounter: Payer: Self-pay | Admitting: Gastroenterology

## 2019-08-23 ENCOUNTER — Other Ambulatory Visit (HOSPITAL_COMMUNITY): Payer: Self-pay | Admitting: Psychiatry

## 2019-08-23 DIAGNOSIS — F331 Major depressive disorder, recurrent, moderate: Secondary | ICD-10-CM

## 2019-08-23 DIAGNOSIS — F509 Eating disorder, unspecified: Secondary | ICD-10-CM

## 2019-08-23 DIAGNOSIS — F411 Generalized anxiety disorder: Secondary | ICD-10-CM

## 2019-08-24 DIAGNOSIS — F411 Generalized anxiety disorder: Secondary | ICD-10-CM | POA: Diagnosis not present

## 2019-08-24 DIAGNOSIS — F5001 Anorexia nervosa, restricting type: Secondary | ICD-10-CM | POA: Diagnosis not present

## 2019-08-24 DIAGNOSIS — F331 Major depressive disorder, recurrent, moderate: Secondary | ICD-10-CM | POA: Diagnosis not present

## 2019-08-26 ENCOUNTER — Encounter: Payer: Self-pay | Admitting: Hematology

## 2019-08-26 DIAGNOSIS — D509 Iron deficiency anemia, unspecified: Secondary | ICD-10-CM | POA: Diagnosis not present

## 2019-08-29 DIAGNOSIS — M6281 Muscle weakness (generalized): Secondary | ICD-10-CM | POA: Diagnosis not present

## 2019-08-29 DIAGNOSIS — M62838 Other muscle spasm: Secondary | ICD-10-CM | POA: Diagnosis not present

## 2019-08-29 DIAGNOSIS — M545 Low back pain: Secondary | ICD-10-CM | POA: Diagnosis not present

## 2019-08-29 DIAGNOSIS — N3943 Post-void dribbling: Secondary | ICD-10-CM | POA: Diagnosis not present

## 2019-08-29 DIAGNOSIS — R3911 Hesitancy of micturition: Secondary | ICD-10-CM | POA: Diagnosis not present

## 2019-08-29 DIAGNOSIS — R208 Other disturbances of skin sensation: Secondary | ICD-10-CM | POA: Diagnosis not present

## 2019-09-01 DIAGNOSIS — G8929 Other chronic pain: Secondary | ICD-10-CM | POA: Diagnosis not present

## 2019-09-01 DIAGNOSIS — Z79891 Long term (current) use of opiate analgesic: Secondary | ICD-10-CM | POA: Diagnosis not present

## 2019-09-01 DIAGNOSIS — G894 Chronic pain syndrome: Secondary | ICD-10-CM | POA: Diagnosis not present

## 2019-09-01 DIAGNOSIS — Z5181 Encounter for therapeutic drug level monitoring: Secondary | ICD-10-CM | POA: Diagnosis not present

## 2019-09-01 DIAGNOSIS — R102 Pelvic and perineal pain: Secondary | ICD-10-CM | POA: Diagnosis not present

## 2019-09-01 DIAGNOSIS — M533 Sacrococcygeal disorders, not elsewhere classified: Secondary | ICD-10-CM | POA: Diagnosis not present

## 2019-09-01 DIAGNOSIS — G629 Polyneuropathy, unspecified: Secondary | ICD-10-CM | POA: Diagnosis not present

## 2019-09-07 ENCOUNTER — Other Ambulatory Visit: Payer: Self-pay

## 2019-09-07 ENCOUNTER — Inpatient Hospital Stay (HOSPITAL_COMMUNITY): Payer: PPO | Attending: Nurse Practitioner | Admitting: Nurse Practitioner

## 2019-09-07 DIAGNOSIS — D508 Other iron deficiency anemias: Secondary | ICD-10-CM
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
## 2019-09-07 NOTE — Assessment & Plan Note (Signed)
1.  Iron deficiency anemia secondary to malabsorption: -Status post gastric bypass and on chronic PPI therapy. -She had previous allergic reaction to Feraheme. -She tolerated two doses of Injectafer without complaint.  However 2 days later she developed fevers post infusion.  We will recommend giving premeds with Injectafer if administered again. -Labs done at [facility] on 08/26/2019 showed hemoglobin 14.5, ferritin 103, percent saturation 24. -She does not need any IV iron at this time. -We will see her back in 4 months with repeat labs.

## 2019-09-12 DIAGNOSIS — R208 Other disturbances of skin sensation: Secondary | ICD-10-CM | POA: Diagnosis not present

## 2019-09-12 DIAGNOSIS — R3911 Hesitancy of micturition: Secondary | ICD-10-CM | POA: Diagnosis not present

## 2019-09-12 DIAGNOSIS — M62838 Other muscle spasm: Secondary | ICD-10-CM | POA: Diagnosis not present

## 2019-09-12 DIAGNOSIS — M6281 Muscle weakness (generalized): Secondary | ICD-10-CM | POA: Diagnosis not present

## 2019-09-12 DIAGNOSIS — N3943 Post-void dribbling: Secondary | ICD-10-CM | POA: Diagnosis not present

## 2019-09-12 DIAGNOSIS — M545 Low back pain: Secondary | ICD-10-CM | POA: Diagnosis not present

## 2019-09-19 ENCOUNTER — Other Ambulatory Visit: Payer: Self-pay | Admitting: Nurse Practitioner

## 2019-09-19 DIAGNOSIS — M6281 Muscle weakness (generalized): Secondary | ICD-10-CM | POA: Diagnosis not present

## 2019-09-19 DIAGNOSIS — M62838 Other muscle spasm: Secondary | ICD-10-CM | POA: Diagnosis not present

## 2019-09-19 DIAGNOSIS — M545 Low back pain: Secondary | ICD-10-CM | POA: Diagnosis not present

## 2019-09-19 DIAGNOSIS — G8929 Other chronic pain: Secondary | ICD-10-CM

## 2019-09-19 DIAGNOSIS — R102 Pelvic and perineal pain: Secondary | ICD-10-CM

## 2019-09-19 DIAGNOSIS — N3943 Post-void dribbling: Secondary | ICD-10-CM | POA: Diagnosis not present

## 2019-09-19 DIAGNOSIS — R3911 Hesitancy of micturition: Secondary | ICD-10-CM | POA: Diagnosis not present

## 2019-09-19 DIAGNOSIS — R208 Other disturbances of skin sensation: Secondary | ICD-10-CM | POA: Diagnosis not present

## 2019-09-21 DIAGNOSIS — F331 Major depressive disorder, recurrent, moderate: Secondary | ICD-10-CM | POA: Diagnosis not present

## 2019-09-21 DIAGNOSIS — F411 Generalized anxiety disorder: Secondary | ICD-10-CM | POA: Diagnosis not present

## 2019-09-21 DIAGNOSIS — F5001 Anorexia nervosa, restricting type: Secondary | ICD-10-CM | POA: Diagnosis not present

## 2019-09-22 ENCOUNTER — Other Ambulatory Visit: Payer: Self-pay

## 2019-09-22 ENCOUNTER — Encounter (HOSPITAL_COMMUNITY): Payer: Self-pay | Admitting: Psychiatry

## 2019-09-22 ENCOUNTER — Telehealth (INDEPENDENT_AMBULATORY_CARE_PROVIDER_SITE_OTHER): Payer: PPO | Admitting: Psychiatry

## 2019-09-22 DIAGNOSIS — F331 Major depressive disorder, recurrent, moderate: Secondary | ICD-10-CM

## 2019-09-22 DIAGNOSIS — F411 Generalized anxiety disorder: Secondary | ICD-10-CM

## 2019-09-22 DIAGNOSIS — F509 Eating disorder, unspecified: Secondary | ICD-10-CM | POA: Diagnosis not present

## 2019-09-22 MED ORDER — TEMAZEPAM 30 MG PO CAPS: 30.0000 mg | ORAL_CAPSULE | Freq: Every day | ORAL | 2 refills | Status: DC

## 2019-09-22 MED ORDER — BUPROPION HCL 75 MG PO TABS: ORAL_TABLET | ORAL | 2 refills | Status: DC

## 2019-09-22 MED ORDER — CLONAZEPAM 0.5 MG PO TABS: 0.5000 mg | ORAL_TABLET | Freq: Three times a day (TID) | ORAL | 1 refills | Status: DC | PRN

## 2019-09-22 MED ORDER — ZIPRASIDONE HCL 60 MG PO CAPS: 60.0000 mg | ORAL_CAPSULE | Freq: Every day | ORAL | 2 refills | Status: DC

## 2019-09-22 NOTE — Progress Notes (Signed)
Virtual Visit via Telephone Note  I connected with Makinzie Bieri Villafuerte on 09/22/19 at 11:40 AM EDT by telephone and verified that I am speaking with the correct person using two identifiers.   I discussed the limitations, risks, security and privacy concerns of performing an evaluation and management service by telephone and the availability of in person appointments. I also discussed with the patient that there may be a patient responsible charge related to this service. The patient expressed understanding and agreed to proceed.  Patient location; home Provider location; home office  History of Present Illness: Patient was evaluated by phone session.  On the last visit we increased Remeron and she is taking 30 mg.  She noticed marked improvement in her sleep but she is still have a lot of anxiety and nervousness.  She feels sometimes sad and dysphoric because of her physical condition and situation that she is in due to physical condition.  She has chronic pain, numbness and feel very nervous and anxious when she go outside.  Since Clobeta sections are easing up she feels more nervous.  She takes sometimes Klonopin 0.5 mg 3 times a day.  She admitted sometimes having racing thoughts but lately she is busy seeing multiple doctors.  She is getting physical therapy, seeing physicians at [facility].  She has upcoming appointment for colonoscopy and dentist.  She feels sometimes overwhelming with these daughter's appointment.  She is seeing a therapist which is helping her eating disorder.  He she denies any purging or restricting.  She does not want to change medication because she realized stopping or reducing the dose of medication will cause worsening of anxiety and depression.  She was very pleased to see her son Hilliard Clark after a while.  She is in therapy with Leonides Sake.  She tried to go back to [place] but she had a hard time getting appointment.    Past Psychiatric History:Reviewed. H/Omultiple inpatient.  Last admission Feb 2015. Good response with ECT but scared to continue maintenance ECT. Tried Cymbalta(suicidal thoughts), Lexapro, Abilify, lithium, Wellbutrin, Lamictal, Ritalin, Remeron, Risperdal, Neurontin, Vistaril, BuSpar, Prozac and Valium.   Psychiatric Specialty Exam: Physical Exam  Review of Systems  There were no vitals taken for this visit.There is no height or weight on file to calculate BMI.  General Appearance: NA  Eye Contact:  NA  Speech:  Slow  Volume:  Decreased  Mood:  Depressed and tired  Affect:  NA  Thought Process:  Goal Directed  Orientation:  Full (Time, Place, and Person)  Thought Content:  Rumination  Suicidal Thoughts:  No  Homicidal Thoughts:  No  Memory:  Immediate;   Good Recent;   Good Remote;   Good  Judgement:  Intact  Insight:  Present  Psychomotor Activity:  NA  Concentration:  Concentration: Fair and Attention Span: Fair  Recall:  Good  Fund of Knowledge:  Good  Language:  Good  Akathisia:  No  Handed:  Right  AIMS (if indicated):     Assets:  Communication Skills Desire for Improvement Housing Resilience Social Support  ADL's:  Intact  Cognition:  WNL  Sleep:   frequent awakening. Feel better      Assessment and Plan: Generalized anxiety disorder.  Major depressive disorder, recurrent.  Eating disorder NOS.  Discussed chronic issues related to her depression and anxiety.  Patient feels her anxiety depression and eating disorders is a stable but wish she had a better control of the symptoms.  She is reluctant to change  the medication or reduce the medication.  I will continue Wellbutrin 75 mg  Twice a day, Geodon 60 mg daily, Klonopin 0.5 mg up to 3 times a day and temazepam 30 mg at bedtime.  Encouraged to continue therapy with Leonides Sake.  Recommended to call us back if she has any question or any concern.  Follow-up in 3 months.  Follow Up Instructions:    I discussed the assessment and treatment plan with the patient. The  patient was provided an opportunity to ask questions and all were answered. The patient agreed with the plan and demonstrated an understanding of the instructions.   The patient was advised to call back or seek an in-person evaluation if the symptoms worsen or if the condition fails to improve as anticipated.  I provided 20 minutes of non-face-to-face time during this encounter.   Kathlee Nations, MD

## 2019-09-28 ENCOUNTER — Ambulatory Visit (HOSPITAL_COMMUNITY): Payer: PPO | Admitting: Psychiatry

## 2019-09-29 ENCOUNTER — Encounter: Payer: Self-pay | Admitting: Family Medicine

## 2019-10-03 DIAGNOSIS — R3911 Hesitancy of micturition: Secondary | ICD-10-CM | POA: Diagnosis not present

## 2019-10-03 DIAGNOSIS — N3943 Post-void dribbling: Secondary | ICD-10-CM | POA: Diagnosis not present

## 2019-10-03 DIAGNOSIS — M6281 Muscle weakness (generalized): Secondary | ICD-10-CM | POA: Diagnosis not present

## 2019-10-03 DIAGNOSIS — M545 Low back pain: Secondary | ICD-10-CM | POA: Diagnosis not present

## 2019-10-03 DIAGNOSIS — R208 Other disturbances of skin sensation: Secondary | ICD-10-CM | POA: Diagnosis not present

## 2019-10-03 DIAGNOSIS — M62838 Other muscle spasm: Secondary | ICD-10-CM | POA: Diagnosis not present

## 2019-10-04 ENCOUNTER — Other Ambulatory Visit: Payer: Self-pay

## 2019-10-04 ENCOUNTER — Ambulatory Visit (AMBULATORY_SURGERY_CENTER): Payer: Self-pay

## 2019-10-04 ENCOUNTER — Telehealth: Payer: Self-pay | Admitting: Gastroenterology

## 2019-10-04 VITALS — Ht 65.5 in | Wt 174.8 lb

## 2019-10-04 DIAGNOSIS — Z86010 Personal history of colonic polyps: Secondary | ICD-10-CM

## 2019-10-04 MED ORDER — SUTAB 1479-225-188 MG PO TABS: 12.0000 | ORAL_TABLET | ORAL | 0 refills | Status: DC

## 2019-10-04 NOTE — Telephone Encounter (Signed)
Spoke with April the pharmacist and she states she can not get the coupon to work. I then called Debbrah Alar representative who is going to call the pharmacy to see if this will help.

## 2019-10-04 NOTE — Progress Notes (Signed)
No allergies to soy or egg Pt is not on blood thinners or diet pills Denies issues with sedation/intubation Denies atrial flutter/fib Denies constipation   Emmi instructions given to pt  Pt is aware of Covid safety and care partner requirements.   Has hx of issues with use of propofol possible causing issue with small fiber neuropathy.  Was told not to use in further surgeries.  Can not lay on back.  Only sides and stomach.

## 2019-10-04 NOTE — Telephone Encounter (Signed)
Pt is scheduled for a colonoscopy 10/18/19.  She stated that pharmacy told her that prep coupon is expired.

## 2019-10-04 NOTE — Telephone Encounter (Signed)
I spoke with Apolonio Schneiders, she was told by the pharmacy that the [employer] coupon worked and the med is $40. I then called the patient and notified her.

## 2019-10-10 ENCOUNTER — Encounter: Payer: Self-pay | Admitting: Family Medicine

## 2019-10-10 ENCOUNTER — Other Ambulatory Visit: Payer: Self-pay

## 2019-10-10 ENCOUNTER — Telehealth (INDEPENDENT_AMBULATORY_CARE_PROVIDER_SITE_OTHER): Payer: PPO | Admitting: Family Medicine

## 2019-10-10 VITALS — BP 115/76 | HR 63 | Ht 65.5 in | Wt 174.0 lb

## 2019-10-10 DIAGNOSIS — D539 Nutritional anemia, unspecified: Secondary | ICD-10-CM | POA: Diagnosis not present

## 2019-10-10 DIAGNOSIS — M545 Low back pain: Secondary | ICD-10-CM | POA: Diagnosis not present

## 2019-10-10 DIAGNOSIS — B373 Candidiasis of vulva and vagina: Secondary | ICD-10-CM | POA: Diagnosis not present

## 2019-10-10 DIAGNOSIS — R208 Other disturbances of skin sensation: Secondary | ICD-10-CM | POA: Diagnosis not present

## 2019-10-10 DIAGNOSIS — R3911 Hesitancy of micturition: Secondary | ICD-10-CM | POA: Diagnosis not present

## 2019-10-10 DIAGNOSIS — E538 Deficiency of other specified B group vitamins: Secondary | ICD-10-CM

## 2019-10-10 DIAGNOSIS — R7989 Other specified abnormal findings of blood chemistry: Secondary | ICD-10-CM | POA: Diagnosis not present

## 2019-10-10 DIAGNOSIS — G43109 Migraine with aura, not intractable, without status migrainosus: Secondary | ICD-10-CM | POA: Diagnosis not present

## 2019-10-10 DIAGNOSIS — M47816 Spondylosis without myelopathy or radiculopathy, lumbar region: Secondary | ICD-10-CM | POA: Diagnosis not present

## 2019-10-10 DIAGNOSIS — N3943 Post-void dribbling: Secondary | ICD-10-CM | POA: Diagnosis not present

## 2019-10-10 DIAGNOSIS — M6281 Muscle weakness (generalized): Secondary | ICD-10-CM | POA: Diagnosis not present

## 2019-10-10 DIAGNOSIS — M62838 Other muscle spasm: Secondary | ICD-10-CM | POA: Diagnosis not present

## 2019-10-10 DIAGNOSIS — B3731 Acute candidiasis of vulva and vagina: Secondary | ICD-10-CM

## 2019-10-10 DIAGNOSIS — F331 Major depressive disorder, recurrent, moderate: Secondary | ICD-10-CM | POA: Diagnosis not present

## 2019-10-10 MED ORDER — ONDANSETRON HCL 4 MG PO TABS: 4.0000 mg | ORAL_TABLET | Freq: Three times a day (TID) | ORAL | 3 refills | Status: DC | PRN

## 2019-10-10 MED ORDER — MICONAZOLE NITRATE 2 % VA CREA: 1.0000 | TOPICAL_CREAM | Freq: Every day | VAGINAL | 1 refills | Status: DC

## 2019-10-10 NOTE — Progress Notes (Signed)
Virtual Visit via Telephone Note  I connected with Katelyn [name] on 10/10/19 at  1:20 PM EDT by video and verified that I am speaking with the correct person using two identifiers.  Location: Patient: home Provider: office   I discussed the limitations, risks, security and privacy concerns of performing an evaluation and management service by telephone and the availability of in person appointments. I also discussed with the patient that there may be a patient responsible charge related to this service. The patient expressed understanding and agreed to proceed.   History of Present Illness: Not doing well. Colonscopy next week Chronic uncontrolled pelvic pain, no relief, rated at a 10 can't sit, only relief lying on her stomach, spends 90 % of time on stomach, still a 10. Has pelvic MRI through [facility], scheduled  In  [place], also following Neurology at [facility] and has apopt with neurosurgery at [facility] next week Thursday and films are scheduled the following Saturday at outside facility , I advise her to discuss with the [facility] neurosurgeon.if they want the test done at [facility] instead Has concerns that her liver enzymes are elevated and no one is doing  Anything about this. From records that I have and I review them with , also that her B12 is elevated and she has not been told to stop it, again, the record I have available does not show this. She is to send in supporting data and she has I will update both to know thwart advice to give Feb 2021 A:LT was  24 was also elevated in December , and again in March and April and B12 is over 2000, in feb 1846 Vaginal itch x 5 days, request fluconazole day 1 , 4 and and nystatin cream  As needed Zofran refill for 4 days / week Reports low blood sugars intermittently, which is not new, does not desire nutrition support, also concerned re weight gain , has stopped topamax, states not needed for her headaches  Observations/Objective: BP  115/76   Pulse 63   Ht 5' 5.5" (1.664 m)   Wt 174 lb (78.9 kg)   BMI 28.51 kg/m  Good communication with no confusion and intact memory. Alert and oriented x 3 No signs of respiratory distress during speech    Assessment and Plan: Vitamin B 12 deficiency Pt is maointained on supplemental B12 and reports above normal  Values, she has had bariatric surgery. Will repeat to determine best advice  Elevated LFTs Reports recent abn lFT, will repeart and let her GI Dr be aware of her concern , she does have a colonoscopy upcoming  Major depressive disorder, recurrent episode, moderate ([place]) Followed closely by Psychiatry, her chronic uncontrolled pain is debilitating  Candidal vulvovaginitis Reports being symptomatic, topical med only as oral meds have interaction with Psych med, she is aware  Spondylosis of lumbar spine Chronic debilitating and uncontrolled pain, managed by pain management and has upcoming neurosurg eval  Migraine with aura and without status migrainosus, not intractable Improved, has d/c topamax, with no worsening of her symptoms    Follow Up Instructions:    I discussed the assessment and treatment plan with the patient. The patient was provided an opportunity to ask questions and all were answered. The patient agreed with the plan and demonstrated an understanding of the instructions.   The patient was advised to call back or seek an in-person evaluation if the symptoms worsen or if the condition fails to improve as anticipated.  I  provided 30 minutes of non-face-to-face time during this encounter.   [name], MD

## 2019-10-10 NOTE — Patient Instructions (Addendum)
F/u with MD in 6 weeks, call if you need me before  Please get non fasting hepatic panel, Vit B 12 level at hospital this week  Zofran and cream are prescribed  I recommend discussing with Neurosurgeon the fact that you have outside imaging planned of your low back  I will co ordinate abnormal labs with other Doctors and keep you informed , as we discussed  I do hope that you are able to get pain relief  Thanks for choosing [facility], we consider it a privelige to serve you.

## 2019-10-13 ENCOUNTER — Other Ambulatory Visit: Payer: Self-pay

## 2019-10-13 ENCOUNTER — Other Ambulatory Visit (HOSPITAL_COMMUNITY)
Admission: RE | Admit: 2019-10-13 | Discharge: 2019-10-13 | Disposition: A | Discharge status: Home / Self Care | Payer: PPO | Source: Ambulatory Visit | Attending: Family Medicine | Admitting: Family Medicine

## 2019-10-13 DIAGNOSIS — F411 Generalized anxiety disorder: Secondary | ICD-10-CM | POA: Diagnosis not present

## 2019-10-13 DIAGNOSIS — F5001 Anorexia nervosa, restricting type: Secondary | ICD-10-CM | POA: Diagnosis not present

## 2019-10-13 DIAGNOSIS — D539 Nutritional anemia, unspecified: Secondary | ICD-10-CM | POA: Diagnosis not present

## 2019-10-13 DIAGNOSIS — F331 Major depressive disorder, recurrent, moderate: Secondary | ICD-10-CM | POA: Diagnosis not present

## 2019-10-13 LAB — HEPATIC FUNCTION PANEL
ALT: 33 U/L (ref 0–44)
AST: 26 U/L (ref 15–41)
Albumin: 4.7 g/dL (ref 3.5–5.0)
Alkaline Phosphatase: 84 U/L (ref 38–126)
Bilirubin, Direct: 0.1 mg/dL (ref 0.0–0.2)
Indirect Bilirubin: 0.5 mg/dL (ref 0.3–0.9)
Total Bilirubin: 0.6 mg/dL (ref 0.3–1.2)
Total Protein: 7.9 g/dL (ref 6.5–8.1)

## 2019-10-13 LAB — VITAMIN B12: Vitamin B-12: 2650 pg/mL — ABNORMAL HIGH (ref 180–914)

## 2019-10-13 LAB — FOLATE: Folate: 40.5 ng/mL (ref >5.9)

## 2019-10-16 ENCOUNTER — Encounter: Payer: Self-pay | Admitting: Family Medicine

## 2019-10-16 DIAGNOSIS — R7989 Other specified abnormal findings of blood chemistry: Secondary | ICD-10-CM | POA: Insufficient documentation

## 2019-10-16 DIAGNOSIS — E538 Deficiency of other specified B group vitamins: Secondary | ICD-10-CM | POA: Insufficient documentation

## 2019-10-16 NOTE — Assessment & Plan Note (Signed)
Followed closely by Psychiatry, her chronic uncontrolled pain is debilitating

## 2019-10-16 NOTE — Assessment & Plan Note (Signed)
Pt is maointained on supplemental B12 and reports above normal  Values, she has had bariatric surgery. Will repeat to determine best advice

## 2019-10-16 NOTE — Assessment & Plan Note (Signed)
Reports recent abn lFT, will repeart and let her GI Dr be aware of her concern , she does have a colonoscopy upcoming

## 2019-10-16 NOTE — Assessment & Plan Note (Signed)
Reports being symptomatic, topical med only as oral meds have interaction with Psych med, she is aware

## 2019-10-16 NOTE — Assessment & Plan Note (Signed)
Chronic debilitating and uncontrolled pain, managed by pain management and has upcoming neurosurg eval

## 2019-10-16 NOTE — Assessment & Plan Note (Signed)
Improved, has d/c topamax, with no worsening of her symptoms

## 2019-10-18 ENCOUNTER — Other Ambulatory Visit: Payer: Self-pay

## 2019-10-18 ENCOUNTER — Ambulatory Visit (AMBULATORY_SURGERY_CENTER): Payer: PPO | Admitting: Gastroenterology

## 2019-10-18 ENCOUNTER — Encounter: Payer: Self-pay | Admitting: Gastroenterology

## 2019-10-18 VITALS — BP 104/56 | HR 65 | Temp 96.8°F | Resp 12 | Ht 65.0 in | Wt 174.0 lb

## 2019-10-18 DIAGNOSIS — D125 Benign neoplasm of sigmoid colon: Secondary | ICD-10-CM

## 2019-10-18 DIAGNOSIS — K635 Polyp of colon: Secondary | ICD-10-CM | POA: Diagnosis not present

## 2019-10-18 DIAGNOSIS — Z86010 Personal history of colonic polyps: Secondary | ICD-10-CM | POA: Diagnosis not present

## 2019-10-18 DIAGNOSIS — D123 Benign neoplasm of transverse colon: Secondary | ICD-10-CM

## 2019-10-18 DIAGNOSIS — Z1211 Encounter for screening for malignant neoplasm of colon: Secondary | ICD-10-CM | POA: Diagnosis not present

## 2019-10-18 MED ORDER — SODIUM CHLORIDE 0.9 % IV SOLN: 500.0000 mL | Freq: Once | INTRAVENOUS | Status: DC

## 2019-10-18 NOTE — Op Note (Signed)
[facility] Patient Name: Katelyn [name] Procedure Date: 10/18/2019 11:44 AM MRN: [id number] Endoscopist: Ladene Artist , MD Age: 44 Referring MD:  Date of Birth: [birth year] Gender: Female Account #: [id number] Procedure:                Colonoscopy Indications:              Surveillance: Personal history of adenomatous                            polyps on last colonoscopy > 5 years ago Medicines:                Fentanyl 100 micrograms IV, Midazolam 8 mg IV Procedure:                Pre-Anesthesia Assessment:                           - Prior to the procedure, a History and Physical                            was performed, and patient medications and                            allergies were reviewed. The patient's tolerance of                            previous anesthesia was also reviewed. The risks                            and benefits of the procedure and the sedation                            options and risks were discussed with the patient.                            All questions were answered, and informed consent                            was obtained. Prior Anticoagulants: The patient has                            taken no previous anticoagulant or antiplatelet                            agents. ASA Grade Assessment: III - A patient with                            severe systemic disease. After reviewing the risks                            and benefits, the patient was deemed in                            satisfactory condition to undergo the procedure.  After obtaining informed consent, the colonoscope                            was passed under direct vision. Throughout the                            procedure, the patient's blood pressure, pulse, and                            oxygen saturations were monitored continuously. The                            Colonoscope was introduced through the anus and                             advanced to the the cecum, identified by                            appendiceal orifice and ileocecal valve. The                            ileocecal valve, appendiceal orifice, and rectum                            were photographed. The quality of the bowel                            preparation was good. The colonoscopy was performed                            without difficulty. The patient tolerated the                            procedure well. Scope In: 12:03:01 PM Scope Out: 12:24:07 PM Scope Withdrawal Time: 0 hours 14 minutes 28 seconds  Total Procedure Duration: 0 hours 21 minutes 6 seconds  Findings:                 The perianal and digital rectal examinations were                            normal.                           Three sessile polyps were found in the sigmoid                            colon (2) and transverse colon (1). The polyps were                            6 to 7 mm in size. These polyps were removed with a                            cold snare. Resection and retrieval were complete.  The exam was otherwise without abnormality on                            direct and retroflexion views. Complications:            No immediate complications. Estimated blood loss:                            None. Estimated Blood Loss:     Estimated blood loss: none. Impression:               - Three 6 to 7 mm polyps in the sigmoid colon and                            in the transverse colon, removed with a cold snare.                            Resected and retrieved.                           - The examination was otherwise normal on direct                            and retroflexion views. Recommendation:           - Repeat colonoscopy date to be determined after                            pending pathology results are reviewed for                            surveillance based on pathology results.                           - Patient has a contact  number available for                            emergencies. The signs and symptoms of potential                            delayed complications were discussed with the                            patient. Return to normal activities tomorrow.                            Written discharge instructions were provided to the                            patient.                           - Resume previous diet.                           - Continue present medications.                           -  Await pathology results. Ladene Artist, MD 10/18/2019 12:30:02 PM This report has been signed electronically.

## 2019-10-18 NOTE — Progress Notes (Signed)
[name] states no medical or surgical changes since previsit or office visit.  VS by SP 

## 2019-10-18 NOTE — Progress Notes (Signed)
pt tolerated well. VSS. awake and to recovery. Report given to RN.  

## 2019-10-18 NOTE — Patient Instructions (Signed)
Handout on polyps given. ° °YOU HAD AN ENDOSCOPIC PROCEDURE TODAY AT THE [facility]:   Refer to the procedure report that was given to you for any specific questions about what was found during the examination.  If the procedure report does not answer your questions, please call your gastroenterologist to clarify.  If you requested that your care partner not be given the details of your procedure findings, then the procedure report has been included in a sealed envelope for you to review at your convenience later. ° °YOU SHOULD EXPECT: Some feelings of bloating in the abdomen. Passage of more gas than usual.  Walking can help get rid of the air that was put into your GI tract during the procedure and reduce the bloating. If you had a lower endoscopy (such as a colonoscopy or flexible sigmoidoscopy) you may notice spotting of blood in your stool or on the toilet paper. If you underwent a bowel prep for your procedure, you may not have a normal bowel movement for a few days. ° °Please Note:  You might notice some irritation and congestion in your nose or some drainage.  This is from the oxygen used during your procedure.  There is no need for concern and it should clear up in a day or so. ° °SYMPTOMS TO REPORT IMMEDIATELY: ° °Following lower endoscopy (colonoscopy or flexible sigmoidoscopy): ° Excessive amounts of blood in the stool ° Significant tenderness or worsening of abdominal pains ° Swelling of the abdomen that is new, acute ° Fever of 100°F or higher ° °For urgent or emergent issues, a gastroenterologist can be reached at any hour by calling [phone]. °Do not use MyChart messaging for urgent concerns.  ° ° °DIET:  We do recommend a small meal at first, but then you may proceed to your regular diet.  Drink plenty of fluids but you should avoid alcoholic beverages for 24 hours. ° °ACTIVITY:  You should plan to take it easy for the rest of today and you should NOT DRIVE or use heavy machinery  until tomorrow (because of the sedation medicines used during the test).   ° °FOLLOW UP: °Our staff will call the number listed on your records 48-72 hours following your procedure to check on you and address any questions or concerns that you may have regarding the information given to you following your procedure. If we do not reach you, we will leave a message.  We will attempt to reach you two times.  During this call, we will ask if you have developed any symptoms of COVID 19. If you develop any symptoms (ie: fever, flu-like symptoms, shortness of breath, cough etc.) before then, please call [phone].  If you test positive for Covid 19 in the 2 weeks post procedure, please call and report this information to us.   ° °If any biopsies were taken you will be contacted by phone or by letter within the next 1-3 weeks.  Please call us at [phone] if you have not heard about the biopsies in 3 weeks.  ° ° °SIGNATURES/CONFIDENTIALITY: °You and/or your care partner have signed paperwork which will be entered into your electronic medical record.  These signatures attest to the fact that that the information above on your After Visit Summary has been reviewed and is understood.  Full responsibility of the confidentiality of this discharge information lies with you and/or your care-partner.  °

## 2019-10-18 NOTE — Progress Notes (Signed)
Called to room to assist during endoscopic procedure.  Patient ID and intended procedure confirmed with present staff. Received instructions for my participation in the procedure from the performing physician.  

## 2019-10-20 ENCOUNTER — Telehealth: Payer: Self-pay | Admitting: *Deleted

## 2019-10-20 NOTE — Telephone Encounter (Signed)
  Follow up Call-  Call back number 10/18/2019  Post procedure Call Back phone  # [phone]  Permission to leave phone message Yes  Some recent data might be hidden     Patient questions:  Do you have a fever, pain , or abdominal swelling? No. Pain Score  0 *  Have you tolerated food without any problems? Yes.    Have you been able to return to your normal activities? Yes.    Do you have any questions about your discharge instructions: Diet   No. Medications  No. Follow up visit  No.  Do you have questions or concerns about your Care? No.  Actions: * If pain score is 4 or above: No action needed, pain <4.  1. Have you developed a fever since your procedure? no  2.   Have you had an respiratory symptoms (SOB or cough) since your procedure? no  3.   Have you tested positive for COVID 19 since your procedure no  4.   Have you had any family members/close contacts diagnosed with the COVID 19 since your procedure?  no   If yes to any of these questions please route to Joylene John, RN and Erenest Rasher, RN

## 2019-10-22 ENCOUNTER — Ambulatory Visit
Admission: RE | Admit: 2019-10-22 | Discharge: 2019-10-22 | Disposition: A | Discharge status: Home / Self Care | Payer: PPO | Source: Ambulatory Visit | Attending: Nurse Practitioner | Admitting: Nurse Practitioner

## 2019-10-22 ENCOUNTER — Other Ambulatory Visit: Payer: Self-pay

## 2019-10-22 DIAGNOSIS — G8929 Other chronic pain: Secondary | ICD-10-CM

## 2019-10-22 DIAGNOSIS — R102 Pelvic and perineal pain: Secondary | ICD-10-CM | POA: Diagnosis not present

## 2019-10-24 DIAGNOSIS — R208 Other disturbances of skin sensation: Secondary | ICD-10-CM | POA: Diagnosis not present

## 2019-10-24 DIAGNOSIS — N3943 Post-void dribbling: Secondary | ICD-10-CM | POA: Diagnosis not present

## 2019-10-24 DIAGNOSIS — M62838 Other muscle spasm: Secondary | ICD-10-CM | POA: Diagnosis not present

## 2019-10-24 DIAGNOSIS — M545 Low back pain: Secondary | ICD-10-CM | POA: Diagnosis not present

## 2019-10-24 DIAGNOSIS — M6281 Muscle weakness (generalized): Secondary | ICD-10-CM | POA: Diagnosis not present

## 2019-10-24 DIAGNOSIS — R3911 Hesitancy of micturition: Secondary | ICD-10-CM | POA: Diagnosis not present

## 2019-10-26 ENCOUNTER — Encounter: Payer: Self-pay | Admitting: Gastroenterology

## 2019-10-26 ENCOUNTER — Other Ambulatory Visit: Payer: Self-pay | Admitting: Obstetrics and Gynecology

## 2019-10-27 DIAGNOSIS — M7918 Myalgia, other site: Secondary | ICD-10-CM | POA: Diagnosis not present

## 2019-11-07 DIAGNOSIS — R3911 Hesitancy of micturition: Secondary | ICD-10-CM | POA: Diagnosis not present

## 2019-11-07 DIAGNOSIS — R208 Other disturbances of skin sensation: Secondary | ICD-10-CM | POA: Diagnosis not present

## 2019-11-07 DIAGNOSIS — M62838 Other muscle spasm: Secondary | ICD-10-CM | POA: Diagnosis not present

## 2019-11-07 DIAGNOSIS — M6281 Muscle weakness (generalized): Secondary | ICD-10-CM | POA: Diagnosis not present

## 2019-11-07 DIAGNOSIS — N3943 Post-void dribbling: Secondary | ICD-10-CM | POA: Diagnosis not present

## 2019-11-07 DIAGNOSIS — M545 Low back pain: Secondary | ICD-10-CM | POA: Diagnosis not present

## 2019-11-11 DIAGNOSIS — F411 Generalized anxiety disorder: Secondary | ICD-10-CM | POA: Diagnosis not present

## 2019-11-11 DIAGNOSIS — F331 Major depressive disorder, recurrent, moderate: Secondary | ICD-10-CM | POA: Diagnosis not present

## 2019-11-11 DIAGNOSIS — F5001 Anorexia nervosa, restricting type: Secondary | ICD-10-CM | POA: Diagnosis not present

## 2019-11-14 DIAGNOSIS — M62838 Other muscle spasm: Secondary | ICD-10-CM | POA: Diagnosis not present

## 2019-11-14 DIAGNOSIS — R3911 Hesitancy of micturition: Secondary | ICD-10-CM | POA: Diagnosis not present

## 2019-11-14 DIAGNOSIS — M545 Low back pain: Secondary | ICD-10-CM | POA: Diagnosis not present

## 2019-11-14 DIAGNOSIS — R208 Other disturbances of skin sensation: Secondary | ICD-10-CM | POA: Diagnosis not present

## 2019-11-14 DIAGNOSIS — M6281 Muscle weakness (generalized): Secondary | ICD-10-CM | POA: Diagnosis not present

## 2019-11-14 DIAGNOSIS — N3943 Post-void dribbling: Secondary | ICD-10-CM | POA: Diagnosis not present

## 2019-11-21 DIAGNOSIS — B373 Candidiasis of vulva and vagina: Secondary | ICD-10-CM | POA: Diagnosis not present

## 2019-11-21 DIAGNOSIS — D252 Subserosal leiomyoma of uterus: Secondary | ICD-10-CM | POA: Diagnosis not present

## 2019-11-25 DIAGNOSIS — F411 Generalized anxiety disorder: Secondary | ICD-10-CM | POA: Diagnosis not present

## 2019-11-25 DIAGNOSIS — F5001 Anorexia nervosa, restricting type: Secondary | ICD-10-CM | POA: Diagnosis not present

## 2019-11-25 DIAGNOSIS — F331 Major depressive disorder, recurrent, moderate: Secondary | ICD-10-CM | POA: Diagnosis not present

## 2019-11-28 DIAGNOSIS — R208 Other disturbances of skin sensation: Secondary | ICD-10-CM | POA: Diagnosis not present

## 2019-11-28 DIAGNOSIS — R3911 Hesitancy of micturition: Secondary | ICD-10-CM | POA: Diagnosis not present

## 2019-11-28 DIAGNOSIS — M6281 Muscle weakness (generalized): Secondary | ICD-10-CM | POA: Diagnosis not present

## 2019-11-28 DIAGNOSIS — M545 Low back pain: Secondary | ICD-10-CM | POA: Diagnosis not present

## 2019-11-28 DIAGNOSIS — M62838 Other muscle spasm: Secondary | ICD-10-CM | POA: Diagnosis not present

## 2019-11-28 DIAGNOSIS — N3943 Post-void dribbling: Secondary | ICD-10-CM | POA: Diagnosis not present

## 2019-12-01 DIAGNOSIS — M533 Sacrococcygeal disorders, not elsewhere classified: Secondary | ICD-10-CM | POA: Diagnosis not present

## 2019-12-01 DIAGNOSIS — G894 Chronic pain syndrome: Secondary | ICD-10-CM | POA: Diagnosis not present

## 2019-12-01 DIAGNOSIS — R102 Pelvic and perineal pain: Secondary | ICD-10-CM | POA: Diagnosis not present

## 2019-12-01 DIAGNOSIS — G8929 Other chronic pain: Secondary | ICD-10-CM | POA: Diagnosis not present

## 2019-12-05 DIAGNOSIS — N3943 Post-void dribbling: Secondary | ICD-10-CM | POA: Diagnosis not present

## 2019-12-05 DIAGNOSIS — M545 Low back pain: Secondary | ICD-10-CM | POA: Diagnosis not present

## 2019-12-05 DIAGNOSIS — R3911 Hesitancy of micturition: Secondary | ICD-10-CM | POA: Diagnosis not present

## 2019-12-05 DIAGNOSIS — R208 Other disturbances of skin sensation: Secondary | ICD-10-CM | POA: Diagnosis not present

## 2019-12-05 DIAGNOSIS — M6281 Muscle weakness (generalized): Secondary | ICD-10-CM | POA: Diagnosis not present

## 2019-12-05 DIAGNOSIS — M62838 Other muscle spasm: Secondary | ICD-10-CM | POA: Diagnosis not present

## 2019-12-09 DIAGNOSIS — F331 Major depressive disorder, recurrent, moderate: Secondary | ICD-10-CM | POA: Diagnosis not present

## 2019-12-09 DIAGNOSIS — F5001 Anorexia nervosa, restricting type: Secondary | ICD-10-CM | POA: Diagnosis not present

## 2019-12-09 DIAGNOSIS — F411 Generalized anxiety disorder: Secondary | ICD-10-CM | POA: Diagnosis not present

## 2019-12-12 DIAGNOSIS — N3943 Post-void dribbling: Secondary | ICD-10-CM | POA: Diagnosis not present

## 2019-12-12 DIAGNOSIS — R208 Other disturbances of skin sensation: Secondary | ICD-10-CM | POA: Diagnosis not present

## 2019-12-12 DIAGNOSIS — M62838 Other muscle spasm: Secondary | ICD-10-CM | POA: Diagnosis not present

## 2019-12-12 DIAGNOSIS — M6281 Muscle weakness (generalized): Secondary | ICD-10-CM | POA: Diagnosis not present

## 2019-12-12 DIAGNOSIS — M545 Low back pain: Secondary | ICD-10-CM | POA: Diagnosis not present

## 2019-12-12 DIAGNOSIS — R3911 Hesitancy of micturition: Secondary | ICD-10-CM | POA: Diagnosis not present

## 2019-12-20 DIAGNOSIS — M545 Low back pain: Secondary | ICD-10-CM | POA: Diagnosis not present

## 2019-12-20 DIAGNOSIS — R3911 Hesitancy of micturition: Secondary | ICD-10-CM | POA: Diagnosis not present

## 2019-12-20 DIAGNOSIS — M6281 Muscle weakness (generalized): Secondary | ICD-10-CM | POA: Diagnosis not present

## 2019-12-20 DIAGNOSIS — M62838 Other muscle spasm: Secondary | ICD-10-CM | POA: Diagnosis not present

## 2019-12-20 DIAGNOSIS — R208 Other disturbances of skin sensation: Secondary | ICD-10-CM | POA: Diagnosis not present

## 2019-12-20 DIAGNOSIS — N3943 Post-void dribbling: Secondary | ICD-10-CM | POA: Diagnosis not present

## 2019-12-21 DIAGNOSIS — M461 Sacroiliitis, not elsewhere classified: Secondary | ICD-10-CM | POA: Diagnosis not present

## 2019-12-21 DIAGNOSIS — M47816 Spondylosis without myelopathy or radiculopathy, lumbar region: Secondary | ICD-10-CM | POA: Diagnosis not present

## 2019-12-22 ENCOUNTER — Other Ambulatory Visit: Payer: Self-pay

## 2019-12-22 ENCOUNTER — Encounter (HOSPITAL_COMMUNITY): Payer: Self-pay | Admitting: Psychiatry

## 2019-12-22 ENCOUNTER — Telehealth (INDEPENDENT_AMBULATORY_CARE_PROVIDER_SITE_OTHER): Payer: PPO | Admitting: Psychiatry

## 2019-12-22 DIAGNOSIS — F509 Eating disorder, unspecified: Secondary | ICD-10-CM | POA: Diagnosis not present

## 2019-12-22 DIAGNOSIS — F331 Major depressive disorder, recurrent, moderate: Secondary | ICD-10-CM | POA: Diagnosis not present

## 2019-12-22 DIAGNOSIS — F411 Generalized anxiety disorder: Secondary | ICD-10-CM | POA: Diagnosis not present

## 2019-12-22 MED ORDER — ZIPRASIDONE HCL 80 MG PO CAPS: 80.0000 mg | ORAL_CAPSULE | Freq: Every day | ORAL | 1 refills | Status: DC

## 2019-12-22 MED ORDER — BUPROPION HCL 75 MG PO TABS: ORAL_TABLET | ORAL | 2 refills | Status: DC

## 2019-12-22 MED ORDER — TEMAZEPAM 30 MG PO CAPS: 30.0000 mg | ORAL_CAPSULE | Freq: Every day | ORAL | 1 refills | Status: DC

## 2019-12-22 MED ORDER — CLONAZEPAM 0.5 MG PO TABS: 0.5000 mg | ORAL_TABLET | Freq: Three times a day (TID) | ORAL | 1 refills | Status: DC | PRN

## 2019-12-22 NOTE — Progress Notes (Signed)
Virtual Visit via Telephone Note  I connected with Katelyn [name] on 12/22/19 at 10:20 AM EDT by telephone and verified that I am speaking with the correct person using two identifiers.  Location: Patient: home Provider: home office   I discussed the limitations, risks, security and privacy concerns of performing an evaluation and management service by telephone and the availability of in person appointments. I also discussed with the patient that there may be a patient responsible charge related to this service. The patient expressed understanding and agreed to proceed.   History of Present Illness: Patient is evaluated by phone session.  She is experiencing increased anxiety and depression.  She noticed pain is not getting better and she has to take gabapentin because it.  Pain gets out of control.  She also frustrated with her weight gain.  Since the gastric procedure she had gained 50 pounds in past 3 years.  She is upset because now school started and her son Hilliard Clark has to quarantine because she had 1 threw up. She feels things are getting out of control.  She admitted having mood swings.  She does not sleep as much.  She still have paranoia.  When she watches the news about [place], increased cases of COVID she gets very scared.  She still have chronic pain, numbness, tingling.  She is now getting corticosteroids injection for some time despite taking gabapentin does not help.  She is also frustrated with her pain management because marijuana try anything else than gabapentin.  She is in therapy and also seeing nutritionist but despite has not able to lose weight.  She notes some time crying spells, paranoia and ruminative thoughts but denies any suicidal thoughts.   Past Psychiatric History: H/Omultipleinpatient.Last admission Feb 2015. Good response with ECT but scared to continue maintenance ECT. Tried Cymbalta(suicidal thoughts), Lexapro, Abilify, lithium, Wellbutrin,  Lamictal, Ritalin, Remeron, Risperdal, Neurontin, Vistaril, BuSpar, Prozac and Valium.   Psychiatric Specialty Exam: Physical Exam  Review of Systems  Weight 176 lb (79.8 kg).There is no height or weight on file to calculate BMI.  General Appearance: NA  Eye Contact:  NA  Speech:  Clear and Coherent  Volume:  Decreased  Mood:  Anxious and Depressed  Affect:  NA  Thought Process:  Goal Directed  Orientation:  Full (Time, Place, and Person)  Thought Content:  Paranoid Ideation and Rumination  Suicidal Thoughts:  No  Homicidal Thoughts:  No  Memory:  Immediate;   Good Recent;   Good Remote;   Good  Judgement:  Fair  Insight:  Shallow  Psychomotor Activity:  NA  Concentration:  Concentration: Fair and Attention Span: Fair  Recall:  Good  Fund of Knowledge:  Good  Language:  Good  Akathisia:  No  Handed:  Right  AIMS (if indicated):     Assets:  Communication Skills Desire for Improvement Housing Transportation  ADL's:  Intact  Cognition:  WNL  Sleep:   fair      Assessment and Plan: Generalized anxiety disorder.  Eating disorder.  Major depressive disorder, recurrent.  I reviewed current past notes and blood work results.  Recommended to increase Geodon 80 mg to help the paranoia.  She is also asking if we can provide something to help her weight loss.  In the past she had tried Topamax that did not help.  I recommend she should see her PCP to see if she can try phentermine.  Continue Klonopin 0.5 mg 3 times a day, temazepam 30  mg at bedtime and we will increase Geodon 80 mg daily and she will continue Wellbutrin 75 mg 2 tablet in the morning.  Recommended to call us back if there is any question or any concern.  Follow-up in 6 weeks.  Follow Up Instructions:    I discussed the assessment and treatment plan with the patient. The patient was provided an opportunity to ask questions and all were answered. The patient agreed with the plan and demonstrated an understanding  of the instructions.   The patient was advised to call back or seek an in-person evaluation if the symptoms worsen or if the condition fails to improve as anticipated.  I provided 20 minutes of non-face-to-face time during this encounter.   Kathlee Nations, MD

## 2019-12-23 DIAGNOSIS — F331 Major depressive disorder, recurrent, moderate: Secondary | ICD-10-CM | POA: Diagnosis not present

## 2019-12-23 DIAGNOSIS — F5001 Anorexia nervosa, restricting type: Secondary | ICD-10-CM | POA: Diagnosis not present

## 2019-12-23 DIAGNOSIS — F411 Generalized anxiety disorder: Secondary | ICD-10-CM | POA: Diagnosis not present

## 2019-12-26 DIAGNOSIS — R3911 Hesitancy of micturition: Secondary | ICD-10-CM | POA: Diagnosis not present

## 2019-12-26 DIAGNOSIS — M545 Low back pain: Secondary | ICD-10-CM | POA: Diagnosis not present

## 2019-12-26 DIAGNOSIS — M62838 Other muscle spasm: Secondary | ICD-10-CM | POA: Diagnosis not present

## 2019-12-26 DIAGNOSIS — M6281 Muscle weakness (generalized): Secondary | ICD-10-CM | POA: Diagnosis not present

## 2019-12-26 DIAGNOSIS — N3943 Post-void dribbling: Secondary | ICD-10-CM | POA: Diagnosis not present

## 2019-12-26 DIAGNOSIS — R208 Other disturbances of skin sensation: Secondary | ICD-10-CM | POA: Diagnosis not present

## 2019-12-30 ENCOUNTER — Other Ambulatory Visit (HOSPITAL_COMMUNITY): Payer: Self-pay

## 2019-12-30 ENCOUNTER — Inpatient Hospital Stay (HOSPITAL_COMMUNITY): Payer: PPO | Attending: Hematology

## 2019-12-30 ENCOUNTER — Other Ambulatory Visit: Payer: Self-pay

## 2019-12-30 DIAGNOSIS — D509 Iron deficiency anemia, unspecified: Secondary | ICD-10-CM

## 2019-12-30 DIAGNOSIS — D513 Other dietary vitamin B12 deficiency anemia: Secondary | ICD-10-CM

## 2019-12-30 DIAGNOSIS — D500 Iron deficiency anemia secondary to blood loss (chronic): Secondary | ICD-10-CM

## 2019-12-30 DIAGNOSIS — D508 Other iron deficiency anemias: Secondary | ICD-10-CM

## 2019-12-30 LAB — CBC WITH DIFFERENTIAL/PLATELET
Abs Immature Granulocytes: 0.03 10*3/uL (ref 0.00–0.07)
Basophils Absolute: 0 10*3/uL (ref 0.0–0.1)
Basophils Relative: 0 %
Eosinophils Absolute: 0.1 10*3/uL (ref 0.0–0.5)
Eosinophils Relative: 1 %
HCT: 45.4 % (ref 36.0–46.0)
Hemoglobin: 14.8 g/dL (ref 12.0–15.0)
Immature Granulocytes: 0 %
Lymphocytes Relative: 25 %
Lymphs Abs: 2 10*3/uL (ref 0.7–4.0)
MCH: 33.2 pg (ref 26.0–34.0)
MCHC: 32.6 g/dL (ref 30.0–36.0)
MCV: 101.8 fL — ABNORMAL HIGH (ref 80.0–100.0)
Monocytes Absolute: 0.4 10*3/uL (ref 0.1–1.0)
Monocytes Relative: 5 %
Neutro Abs: 5.5 10*3/uL (ref 1.7–7.7)
Neutrophils Relative %: 69 %
Platelets: 321 10*3/uL (ref 150–400)
RBC: 4.46 MIL/uL (ref 3.87–5.11)
RDW: 12.7 % (ref 11.5–15.5)
WBC: 8 10*3/uL (ref 4.0–10.5)
nRBC: 0 % (ref 0.0–0.2)

## 2019-12-30 LAB — IRON AND TIBC
Iron: 71 ug/dL (ref 28–170)
Saturation Ratios: 18 % (ref 10.4–31.8)
TIBC: 393 ug/dL (ref 250–450)
UIBC: 322 ug/dL

## 2019-12-30 LAB — COMPREHENSIVE METABOLIC PANEL
ALT: 28 U/L (ref 0–44)
AST: 24 U/L (ref 15–41)
Albumin: 4.9 g/dL (ref 3.5–5.0)
Alkaline Phosphatase: 73 U/L (ref 38–126)
Anion gap: 12 (ref 5–15)
BUN: 15 mg/dL (ref 6–20)
CO2: 25 mmol/L (ref 22–32)
Calcium: 9.3 mg/dL (ref 8.9–10.3)
Chloride: 101 mmol/L (ref 98–111)
Creatinine, Ser: 0.74 mg/dL (ref 0.44–1.00)
GFR calc Af Amer: >60 mL/min (ref >60)
GFR calc non Af Amer: >60 mL/min (ref >60)
Glucose, Bld: 89 mg/dL (ref 70–99)
Potassium: 4 mmol/L (ref 3.5–5.1)
Sodium: 138 mmol/L (ref 135–145)
Total Bilirubin: 0.6 mg/dL (ref 0.3–1.2)
Total Protein: 8.2 g/dL — ABNORMAL HIGH (ref 6.5–8.1)

## 2019-12-30 LAB — FOLATE: Folate: 25.9 ng/mL (ref >5.9)

## 2019-12-30 LAB — VITAMIN D 25 HYDROXY (VIT D DEFICIENCY, FRACTURES): Vit D, 25-Hydroxy: 58.08 ng/mL (ref 30–100)

## 2019-12-30 LAB — LACTATE DEHYDROGENASE: LDH: 185 U/L (ref 98–192)

## 2019-12-30 LAB — FERRITIN: Ferritin: 48 ng/mL (ref 11–307)

## 2019-12-30 LAB — VITAMIN B12: Vitamin B-12: 4119 pg/mL — ABNORMAL HIGH (ref 180–914)

## 2020-01-05 DIAGNOSIS — F5001 Anorexia nervosa, restricting type: Secondary | ICD-10-CM | POA: Diagnosis not present

## 2020-01-05 DIAGNOSIS — F331 Major depressive disorder, recurrent, moderate: Secondary | ICD-10-CM | POA: Diagnosis not present

## 2020-01-05 DIAGNOSIS — F411 Generalized anxiety disorder: Secondary | ICD-10-CM | POA: Diagnosis not present

## 2020-01-10 ENCOUNTER — Inpatient Hospital Stay (HOSPITAL_BASED_OUTPATIENT_CLINIC_OR_DEPARTMENT_OTHER): Payer: PPO | Admitting: Nurse Practitioner

## 2020-01-10 ENCOUNTER — Other Ambulatory Visit: Payer: Self-pay

## 2020-01-10 DIAGNOSIS — D508 Other iron deficiency anemias: Secondary | ICD-10-CM | POA: Diagnosis not present

## 2020-01-10 NOTE — Assessment & Plan Note (Addendum)
1.  Iron deficiency anemia secondary to malabsorption: -Status post gastric bypass and on chronic PPI therapy. -She had previous allergic reaction to Feraheme. -She tolerated two doses of Injectafer without complaint.  However 2 days later she developed fevers post infusion.  We will recommend giving premeds with Injectafer if administered again. -Labs done on 12/30/2019 showed hemoglobin 14.8, ferritin 48, percent saturation 18, platelets 321.  LDH 185, B12 4119 -Patient reports she stopped taking her vitamin B12 however her level has increased.  We will recheck it again at her next visit. -She does not need any IV iron at this time. -We will see her back in 3 months with repeat labs.
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
## 2020-01-12 DIAGNOSIS — F331 Major depressive disorder, recurrent, moderate: Secondary | ICD-10-CM | POA: Diagnosis not present

## 2020-01-12 DIAGNOSIS — F5001 Anorexia nervosa, restricting type: Secondary | ICD-10-CM | POA: Diagnosis not present

## 2020-01-12 DIAGNOSIS — F411 Generalized anxiety disorder: Secondary | ICD-10-CM | POA: Diagnosis not present

## 2020-01-17 DIAGNOSIS — M6281 Muscle weakness (generalized): Secondary | ICD-10-CM | POA: Diagnosis not present

## 2020-01-17 DIAGNOSIS — M62838 Other muscle spasm: Secondary | ICD-10-CM | POA: Diagnosis not present

## 2020-01-17 DIAGNOSIS — M545 Low back pain: Secondary | ICD-10-CM | POA: Diagnosis not present

## 2020-01-17 DIAGNOSIS — N3943 Post-void dribbling: Secondary | ICD-10-CM | POA: Diagnosis not present

## 2020-01-17 DIAGNOSIS — R3911 Hesitancy of micturition: Secondary | ICD-10-CM | POA: Diagnosis not present

## 2020-01-17 DIAGNOSIS — R208 Other disturbances of skin sensation: Secondary | ICD-10-CM | POA: Diagnosis not present

## 2020-01-19 DIAGNOSIS — G8929 Other chronic pain: Secondary | ICD-10-CM | POA: Diagnosis not present

## 2020-01-19 DIAGNOSIS — G894 Chronic pain syndrome: Secondary | ICD-10-CM | POA: Diagnosis not present

## 2020-01-19 DIAGNOSIS — M461 Sacroiliitis, not elsewhere classified: Secondary | ICD-10-CM | POA: Diagnosis not present

## 2020-01-19 DIAGNOSIS — R102 Pelvic and perineal pain: Secondary | ICD-10-CM | POA: Diagnosis not present

## 2020-01-19 DIAGNOSIS — G629 Polyneuropathy, unspecified: Secondary | ICD-10-CM | POA: Diagnosis not present

## 2020-01-23 DIAGNOSIS — R3911 Hesitancy of micturition: Secondary | ICD-10-CM | POA: Diagnosis not present

## 2020-01-23 DIAGNOSIS — M545 Low back pain: Secondary | ICD-10-CM | POA: Diagnosis not present

## 2020-01-23 DIAGNOSIS — M62838 Other muscle spasm: Secondary | ICD-10-CM | POA: Diagnosis not present

## 2020-01-23 DIAGNOSIS — R208 Other disturbances of skin sensation: Secondary | ICD-10-CM | POA: Diagnosis not present

## 2020-01-23 DIAGNOSIS — N3943 Post-void dribbling: Secondary | ICD-10-CM | POA: Diagnosis not present

## 2020-01-23 DIAGNOSIS — M6281 Muscle weakness (generalized): Secondary | ICD-10-CM | POA: Diagnosis not present

## 2020-01-24 DIAGNOSIS — F5001 Anorexia nervosa, restricting type: Secondary | ICD-10-CM | POA: Diagnosis not present

## 2020-01-24 DIAGNOSIS — F331 Major depressive disorder, recurrent, moderate: Secondary | ICD-10-CM | POA: Diagnosis not present

## 2020-01-24 DIAGNOSIS — F411 Generalized anxiety disorder: Secondary | ICD-10-CM | POA: Diagnosis not present

## 2020-01-30 DIAGNOSIS — M6281 Muscle weakness (generalized): Secondary | ICD-10-CM | POA: Diagnosis not present

## 2020-01-30 DIAGNOSIS — N3943 Post-void dribbling: Secondary | ICD-10-CM | POA: Diagnosis not present

## 2020-01-30 DIAGNOSIS — M62838 Other muscle spasm: Secondary | ICD-10-CM | POA: Diagnosis not present

## 2020-01-30 DIAGNOSIS — R208 Other disturbances of skin sensation: Secondary | ICD-10-CM | POA: Diagnosis not present

## 2020-01-30 DIAGNOSIS — R3911 Hesitancy of micturition: Secondary | ICD-10-CM | POA: Diagnosis not present

## 2020-02-02 DIAGNOSIS — F5001 Anorexia nervosa, restricting type: Secondary | ICD-10-CM | POA: Diagnosis not present

## 2020-02-02 DIAGNOSIS — F411 Generalized anxiety disorder: Secondary | ICD-10-CM | POA: Diagnosis not present

## 2020-02-02 DIAGNOSIS — F331 Major depressive disorder, recurrent, moderate: Secondary | ICD-10-CM | POA: Diagnosis not present

## 2020-02-06 ENCOUNTER — Telehealth (INDEPENDENT_AMBULATORY_CARE_PROVIDER_SITE_OTHER): Payer: PPO | Admitting: Psychiatry

## 2020-02-06 ENCOUNTER — Encounter (HOSPITAL_COMMUNITY): Payer: Self-pay | Admitting: Psychiatry

## 2020-02-06 ENCOUNTER — Other Ambulatory Visit: Payer: Self-pay

## 2020-02-06 DIAGNOSIS — F331 Major depressive disorder, recurrent, moderate: Secondary | ICD-10-CM | POA: Diagnosis not present

## 2020-02-06 DIAGNOSIS — F509 Eating disorder, unspecified: Secondary | ICD-10-CM | POA: Diagnosis not present

## 2020-02-06 DIAGNOSIS — F411 Generalized anxiety disorder: Secondary | ICD-10-CM

## 2020-02-06 MED ORDER — CLONAZEPAM 0.5 MG PO TABS: 0.5000 mg | ORAL_TABLET | Freq: Three times a day (TID) | ORAL | 1 refills | Status: DC | PRN

## 2020-02-06 MED ORDER — BUPROPION HCL 75 MG PO TABS: ORAL_TABLET | ORAL | 1 refills | Status: DC

## 2020-02-06 MED ORDER — TEMAZEPAM 30 MG PO CAPS: 30.0000 mg | ORAL_CAPSULE | Freq: Every day | ORAL | 1 refills | Status: DC

## 2020-02-06 MED ORDER — ZIPRASIDONE HCL 80 MG PO CAPS: 80.0000 mg | ORAL_CAPSULE | Freq: Every day | ORAL | 1 refills | Status: DC

## 2020-02-06 NOTE — Progress Notes (Signed)
Virtual Visit via Telephone Note  I connected with Katelyn [name] on 02/06/20 at 10:20 AM EDT by telephone and verified that I am speaking with the correct person using two identifiers.  Location: Patient: Home Provider: Home office   I discussed the limitations, risks, security and privacy concerns of performing an evaluation and management service by telephone and the availability of in person appointments. I also discussed with the patient that there may be a patient responsible charge related to this service. The patient expressed understanding and agreed to proceed.   History of Present Illness: Patient is evaluated by phone session.  On the last visit we increase Geodon to 80 mg at bedtime.  She noticed improvement in her paranoia and sleep.  However she continues to struggle with anxiety.  She is concerned about her eating-year-old daughter who diagnosed with eating disorder.  Her physician recommended inpatient and she is hoping to find a place where she can admit her.  She also concerned about her chronic pain and being the gabapentin does not help as much and she is gaining weight.  Since the last visit she had not gained weight but she is also not engage in any purging or restricting her diet.  She feels hungry after taking gabapentin.  She is at pain management and hoping that they can think about some other modalities to help her pain.  One of her physician recommended marijuana but she is afraid if her labs shows positive marijuana her pain management will let her go.  She does not want to take that chances.  She is in therapy with [facility]. She has no tremors, shakes or any major concern from the medication.  She denies any suicidal thoughts or homicidal thoughts.  She still have some paranoia but increased Geodon did help her.  Recently she had a blood work.  Her vitamin D level is improved.  She is hoping to have a lot of infusion in December.  She had cut down watching TV and  social media which was also contributing to her anxiety and paranoia.  She is compliant with Wellbutrin, Geodon, Klonopin, temazepam.   Past Psychiatric History: H/Omultipleinpatient.Last admission Feb 2015. Good response with ECT but scared to continue maintenance ECT. Tried Cymbalta(suicidal thoughts), Lexapro, Abilify, lithium, Wellbutrin, Lamictal, Ritalin, Remeron, Risperdal, Neurontin, Vistaril, BuSpar, Prozac and Valium.  Recent Results (from the past 2160 hour(s))  Folate     Status: None   Collection Time: 12/30/19 10:45 AM  Result Value Ref Range   Folate 25.9 >5.9 ng/mL    Comment: RESULTS CONFIRMED BY MANUAL DILUTION Performed at [facility], [address], [place], [place] [address]   Vitamin B12     Status: Abnormal   Collection Time: 12/30/19 10:45 AM  Result Value Ref Range   Vitamin B-12 4,119 (H) 180 - 914 pg/mL    Comment: RESULTS CONFIRMED BY MANUAL DILUTION Performed at [facility], [address], [place], [place] [address]   CBC with Differential     Status: Abnormal   Collection Time: 12/30/19 10:45 AM  Result Value Ref Range   WBC 8.0 4.0 - 10.5 K/uL   RBC 4.46 3.87 - 5.11 MIL/uL   Hemoglobin 14.8 12.0 - 15.0 g/dL   HCT 45.4 36 - 46 %   MCV 101.8 (H) 80.0 - 100.0 fL   MCH 33.2 26.0 - 34.0 pg   MCHC 32.6 30.0 - 36.0 g/dL   RDW 12.7 11.5 - 15.5 %   Platelets 321  150 - 400 K/uL   nRBC 0.0 0.0 - 0.2 %   Neutrophils Relative % 69 %   Neutro Abs 5.5 1.7 - 7.7 K/uL   Lymphocytes Relative 25 %   Lymphs Abs 2.0 0.7 - 4.0 K/uL   Monocytes Relative 5 %   Monocytes Absolute 0.4 0.1 - 1.0 K/uL   Eosinophils Relative 1 %   Eosinophils Absolute 0.1 0 - 0 K/uL   Basophils Relative 0 %   Basophils Absolute 0.0 0 - 0 K/uL   Immature Granulocytes 0 %   Abs Immature Granulocytes 0.03 0.00 - 0.07 K/uL    Comment: Performed at [facility], [address], [place], [place] [address]  Comprehensive metabolic panel     Status: Abnormal   Collection Time:  12/30/19 10:45 AM  Result Value Ref Range   Sodium 138 135 - 145 mmol/L   Potassium 4.0 3.5 - 5.1 mmol/L   Chloride 101 98 - 111 mmol/L   CO2 25 22 - 32 mmol/L   Glucose, Bld 89 70 - 99 mg/dL    Comment: Glucose reference range applies only to samples taken after fasting for at least 8 hours.   BUN 15 6 - 20 mg/dL   Creatinine, Ser 0.74 0.44 - 1.00 mg/dL   Calcium 9.3 8.9 - 10.3 mg/dL   Total Protein 8.2 (H) 6.5 - 8.1 g/dL   Albumin 4.9 3.5 - 5.0 g/dL   AST 24 15 - 41 U/L   ALT 28 0 - 44 U/L   Alkaline Phosphatase 73 38 - 126 U/L   Total Bilirubin 0.6 0.3 - 1.2 mg/dL   GFR calc non Af Amer >60 >60 mL/min   GFR calc Af Amer >60 >60 mL/min   Anion gap 12 5 - 15    Comment: Performed at [facility], [address], [place], [place] [address]  Ferritin     Status: None   Collection Time: 12/30/19 10:45 AM  Result Value Ref Range   Ferritin 48 11 - 307 ng/mL    Comment: Performed at [facility], [address], [place], [place] [address]  Iron and TIBC     Status: None   Collection Time: 12/30/19 10:45 AM  Result Value Ref Range   Iron 71 28 - 170 ug/dL   TIBC 393 250 - 450 ug/dL   Saturation Ratios 18 10.4 - 31.8 %   UIBC 322 ug/dL    Comment: Performed at [facility], [address] [place], [place] [address]  Vitamin D 25 hydroxy     Status: None   Collection Time: 12/30/19 10:45 AM  Result Value Ref Range   Vit D, 25-Hydroxy 58.08 30 - 100 ng/mL    Comment: (NOTE) Vitamin D deficiency has been defined by the [place] practice guideline as a level of serum 25-OH  vitamin D less than 20 ng/mL (1,2). The Endocrine Society went on to  further define vitamin D insufficiency as a level between 21 and 29  ng/mL (2).  1. IOM (Institute of Medicine). 2010. Dietary reference intakes for  calcium and D. [place]: The [employer]. 2. [place]ick MF, Binkley [place], Bischoff-Ferrari HA, et al. Evaluation,  treatment, and prevention of  vitamin D deficiency: an Endocrine  Society clinical practice guideline, JCEM. 2011 Jul; 96(7): 1911-30.  Performed at [facility], [place] [address], [place], [place] [address]   Lactate dehydrogenase     Status: None  Collection Time: 12/30/19 10:45 AM  Result Value Ref Range   LDH 185 98 - 192 U/L    Comment: Performed at [facility], [address], [place], [place] [address]     Psychiatric Specialty Exam: Physical Exam  Review of Systems  Weight 177 lb (80.3 kg).There is no height or weight on file to calculate BMI.  General Appearance: NA  Eye Contact:  NA  Speech:  Slow  Volume:  Decreased  Mood:  Anxious and Depressed  Affect:  NA  Thought Process:  Goal Directed  Orientation:  Full (Time, Place, and Person)  Thought Content:  Rumination  Suicidal Thoughts:  No  Homicidal Thoughts:  No  Memory:  Immediate;   Good Recent;   Good Remote;   Good  Judgement:  Intact  Insight:  Shallow  Psychomotor Activity:  NA  Concentration:  Concentration: Fair and Attention Span: Fair  Recall:  Good  Fund of Knowledge:  Good  Language:  Good  Akathisia:  No  Handed:  Right  AIMS (if indicated):     Assets:  Communication Skills Desire for Improvement Housing Social Support  ADL's:  Intact  Cognition:  WNL  Sleep:   ok      Assessment and Plan: Generalized anxiety disorder.  Eating disorder.  Major depressive disorder, recurrent.  I reviewed blood work results.  Patient doing better with increased dose of Geodon.  Her paranoia and sleep is improved.  However she still have a lot of anxiety.  I recommend to try Wellbutrin 150 mg in the morning and 75 mg in the afternoon.  She used to take a higher dose in the past but it cut down due to complaint of insomnia.  I recommend she can try taking the second dose in the early afternoon however if she notices her anxiety and insomnia getting worse then she should stay on current dose of Wellbutrin.  She agreed to give a try.   I will continue temazepam 30 mg at bedtime, Geodon 80 mg at bedtime, Klonopin 0.5 mg 3 times a day and she will try Wellbutrin 75 mg 2 tablet in the morning and 75 mg in the afternoon.  Encouraged to continue therapy with [facility].  Recommended to call us back if she has any question or any concern.  Follow-up in 2 months.  Follow Up Instructions:    I discussed the assessment and treatment plan with the patient. The patient was provided an opportunity to ask questions and all were answered. The patient agreed with the plan and demonstrated an understanding of the instructions.   The patient was advised to call back or seek an in-person evaluation if the symptoms worsen or if the condition fails to improve as anticipated.  I provided 21 minutes of non-face-to-face time during this encounter.   Kathlee Nations, MD

## 2020-02-08 ENCOUNTER — Other Ambulatory Visit (HOSPITAL_COMMUNITY): Payer: Self-pay | Admitting: *Deleted

## 2020-02-08 DIAGNOSIS — N3943 Post-void dribbling: Secondary | ICD-10-CM | POA: Diagnosis not present

## 2020-02-08 DIAGNOSIS — M62838 Other muscle spasm: Secondary | ICD-10-CM | POA: Diagnosis not present

## 2020-02-08 DIAGNOSIS — M6281 Muscle weakness (generalized): Secondary | ICD-10-CM | POA: Diagnosis not present

## 2020-02-08 DIAGNOSIS — R3911 Hesitancy of micturition: Secondary | ICD-10-CM | POA: Diagnosis not present

## 2020-02-08 DIAGNOSIS — R208 Other disturbances of skin sensation: Secondary | ICD-10-CM | POA: Diagnosis not present

## 2020-02-13 DIAGNOSIS — M6281 Muscle weakness (generalized): Secondary | ICD-10-CM | POA: Diagnosis not present

## 2020-02-13 DIAGNOSIS — N3943 Post-void dribbling: Secondary | ICD-10-CM | POA: Diagnosis not present

## 2020-02-13 DIAGNOSIS — R3911 Hesitancy of micturition: Secondary | ICD-10-CM | POA: Diagnosis not present

## 2020-02-13 DIAGNOSIS — R208 Other disturbances of skin sensation: Secondary | ICD-10-CM | POA: Diagnosis not present

## 2020-02-13 DIAGNOSIS — M62838 Other muscle spasm: Secondary | ICD-10-CM | POA: Diagnosis not present

## 2020-02-16 DIAGNOSIS — F5001 Anorexia nervosa, restricting type: Secondary | ICD-10-CM | POA: Diagnosis not present

## 2020-02-16 DIAGNOSIS — F411 Generalized anxiety disorder: Secondary | ICD-10-CM | POA: Diagnosis not present

## 2020-02-16 DIAGNOSIS — F331 Major depressive disorder, recurrent, moderate: Secondary | ICD-10-CM | POA: Diagnosis not present

## 2020-02-20 DIAGNOSIS — N3943 Post-void dribbling: Secondary | ICD-10-CM | POA: Diagnosis not present

## 2020-02-20 DIAGNOSIS — R208 Other disturbances of skin sensation: Secondary | ICD-10-CM | POA: Diagnosis not present

## 2020-02-20 DIAGNOSIS — R3911 Hesitancy of micturition: Secondary | ICD-10-CM | POA: Diagnosis not present

## 2020-02-20 DIAGNOSIS — M6281 Muscle weakness (generalized): Secondary | ICD-10-CM | POA: Diagnosis not present

## 2020-02-20 DIAGNOSIS — M62838 Other muscle spasm: Secondary | ICD-10-CM | POA: Diagnosis not present

## 2020-02-22 ENCOUNTER — Encounter: Payer: Self-pay | Admitting: Family Medicine

## 2020-02-22 ENCOUNTER — Ambulatory Visit (INDEPENDENT_AMBULATORY_CARE_PROVIDER_SITE_OTHER): Payer: PPO | Admitting: Family Medicine

## 2020-02-22 ENCOUNTER — Other Ambulatory Visit: Payer: Self-pay

## 2020-02-22 VITALS — BP 124/80 | HR 85 | Temp 98.1°F

## 2020-02-22 DIAGNOSIS — R635 Abnormal weight gain: Secondary | ICD-10-CM | POA: Diagnosis not present

## 2020-02-22 MED ORDER — ONDANSETRON HCL 4 MG PO TABS: 4.0000 mg | ORAL_TABLET | Freq: Three times a day (TID) | ORAL | 6 refills | Status: DC | PRN

## 2020-02-22 NOTE — Progress Notes (Signed)
   Subjective:    Patient ID: Katelyn [name], female    DOB: 1976/04/04, 44 y.o.   MRN: [id number]  HPI  Patient comes in today to establish care with Dr. Nicki Reaper, would not not disclose any other information.  Very nice patient Multiple underlying health issues She is trying her best in difficult situations She has a daughter who has a eating disorder and will be going into inpatient care for this She also has a son who has some health issues himself The patient has some underlying psychiatric health issues She also has some physical health issues as well This is caused her significant distress She also has significant dysfunction of her lower back which prevents him from being able to sit well She is disabled In the past she has had a gastric bypass. Recently her weight is gone up because the gabapentin which is being used for nerve dysfunction She also sees psychiatrist on a regular basis for evaluation and management of her medications Recently she has been putting on weight related to the gabapentin which is distressing her She is interested in the potential for utilizing phenteramine temporarily to help her reduce appetite reduce her weight She does state in the past she had a history of eating disorder but does not have this currently She also has underlying lung issues for which she is followed by specialist  Review of Systems  Constitutional: Positive for fatigue. Negative for activity change, appetite change and unexpected weight change.  HENT: Negative for congestion and rhinorrhea.   Respiratory: Negative for cough and shortness of breath.   Cardiovascular: Negative for chest pain and leg swelling.  Gastrointestinal: Negative for abdominal pain, nausea and vomiting.  Skin: Negative for color change.  Neurological: Negative for dizziness, weakness and headaches.  Psychiatric/Behavioral: Negative for agitation, behavioral problems and confusion.       Objective:    Physical Exam Vitals reviewed.  Constitutional:      General: She is not in acute distress. HENT:     Head: Normocephalic.  Cardiovascular:     Rate and Rhythm: Normal rate and regular rhythm.     Heart sounds: Normal heart sounds. No murmur heard.   Pulmonary:     Effort: Pulmonary effort is normal.     Breath sounds: Normal breath sounds.  Lymphadenopathy:     Cervical: No cervical adenopathy.  Neurological:     Mental Status: She is alert.  Psychiatric:        Behavior: Behavior normal.           Assessment & Plan:  Patient with weight gain She is trying hard to minimize calories exercise is best she can tolerate Patient also relates she is interested in trying medication to lower her appetite I will discuss electronically with her psychiatrist whether or not he is okay with short-term use of phenteramine

## 2020-02-24 DIAGNOSIS — N920 Excessive and frequent menstruation with regular cycle: Secondary | ICD-10-CM | POA: Diagnosis not present

## 2020-02-24 DIAGNOSIS — R300 Dysuria: Secondary | ICD-10-CM | POA: Diagnosis not present

## 2020-02-24 DIAGNOSIS — D259 Leiomyoma of uterus, unspecified: Secondary | ICD-10-CM | POA: Diagnosis not present

## 2020-02-24 DIAGNOSIS — N941 Unspecified dyspareunia: Secondary | ICD-10-CM | POA: Diagnosis not present

## 2020-02-24 DIAGNOSIS — Z1231 Encounter for screening mammogram for malignant neoplasm of breast: Secondary | ICD-10-CM | POA: Diagnosis not present

## 2020-02-24 DIAGNOSIS — Z124 Encounter for screening for malignant neoplasm of cervix: Secondary | ICD-10-CM | POA: Diagnosis not present

## 2020-02-29 DIAGNOSIS — M6281 Muscle weakness (generalized): Secondary | ICD-10-CM | POA: Diagnosis not present

## 2020-02-29 DIAGNOSIS — R208 Other disturbances of skin sensation: Secondary | ICD-10-CM | POA: Diagnosis not present

## 2020-02-29 DIAGNOSIS — N3943 Post-void dribbling: Secondary | ICD-10-CM | POA: Diagnosis not present

## 2020-02-29 DIAGNOSIS — R3911 Hesitancy of micturition: Secondary | ICD-10-CM | POA: Diagnosis not present

## 2020-02-29 DIAGNOSIS — M62838 Other muscle spasm: Secondary | ICD-10-CM | POA: Diagnosis not present

## 2020-03-01 DIAGNOSIS — F411 Generalized anxiety disorder: Secondary | ICD-10-CM | POA: Diagnosis not present

## 2020-03-01 DIAGNOSIS — F5001 Anorexia nervosa, restricting type: Secondary | ICD-10-CM | POA: Diagnosis not present

## 2020-03-01 DIAGNOSIS — F331 Major depressive disorder, recurrent, moderate: Secondary | ICD-10-CM | POA: Diagnosis not present

## 2020-03-05 DIAGNOSIS — R3911 Hesitancy of micturition: Secondary | ICD-10-CM | POA: Diagnosis not present

## 2020-03-05 DIAGNOSIS — R208 Other disturbances of skin sensation: Secondary | ICD-10-CM | POA: Diagnosis not present

## 2020-03-05 DIAGNOSIS — M6281 Muscle weakness (generalized): Secondary | ICD-10-CM | POA: Diagnosis not present

## 2020-03-05 DIAGNOSIS — N3943 Post-void dribbling: Secondary | ICD-10-CM | POA: Diagnosis not present

## 2020-03-05 DIAGNOSIS — M62838 Other muscle spasm: Secondary | ICD-10-CM | POA: Diagnosis not present

## 2020-03-08 DIAGNOSIS — R3911 Hesitancy of micturition: Secondary | ICD-10-CM | POA: Diagnosis not present

## 2020-03-08 DIAGNOSIS — M6281 Muscle weakness (generalized): Secondary | ICD-10-CM | POA: Diagnosis not present

## 2020-03-08 DIAGNOSIS — M62838 Other muscle spasm: Secondary | ICD-10-CM | POA: Diagnosis not present

## 2020-03-08 DIAGNOSIS — N3943 Post-void dribbling: Secondary | ICD-10-CM | POA: Diagnosis not present

## 2020-03-08 DIAGNOSIS — R208 Other disturbances of skin sensation: Secondary | ICD-10-CM | POA: Diagnosis not present

## 2020-03-13 ENCOUNTER — Encounter: Payer: Self-pay | Admitting: Family Medicine

## 2020-03-13 ENCOUNTER — Other Ambulatory Visit (HOSPITAL_COMMUNITY): Payer: Self-pay | Admitting: Psychiatry

## 2020-03-15 DIAGNOSIS — M62838 Other muscle spasm: Secondary | ICD-10-CM | POA: Diagnosis not present

## 2020-03-15 DIAGNOSIS — M6281 Muscle weakness (generalized): Secondary | ICD-10-CM | POA: Diagnosis not present

## 2020-03-15 DIAGNOSIS — R3911 Hesitancy of micturition: Secondary | ICD-10-CM | POA: Diagnosis not present

## 2020-03-15 DIAGNOSIS — R208 Other disturbances of skin sensation: Secondary | ICD-10-CM | POA: Diagnosis not present

## 2020-03-15 DIAGNOSIS — N3943 Post-void dribbling: Secondary | ICD-10-CM | POA: Diagnosis not present

## 2020-03-16 ENCOUNTER — Other Ambulatory Visit: Payer: Self-pay | Admitting: *Deleted

## 2020-03-16 MED ORDER — PHENTERMINE HCL 37.5 MG PO CAPS: 37.5000 mg | ORAL_CAPSULE | ORAL | 1 refills | Status: DC

## 2020-03-16 NOTE — Telephone Encounter (Signed)
Nurses Please put phenteramine 37.5 mg 1 every morning, #30, 1 refill into the refill portal I will sign off on it today.

## 2020-03-19 ENCOUNTER — Ambulatory Visit: Payer: PPO | Admitting: Pulmonary Disease

## 2020-03-20 ENCOUNTER — Other Ambulatory Visit: Payer: Self-pay

## 2020-03-20 ENCOUNTER — Ambulatory Visit: Payer: PPO | Admitting: Pulmonary Disease

## 2020-03-20 ENCOUNTER — Encounter: Payer: Self-pay | Admitting: Pulmonary Disease

## 2020-03-20 VITALS — BP 114/68 | HR 89 | Temp 97.2°F

## 2020-03-20 DIAGNOSIS — J45909 Unspecified asthma, uncomplicated: Secondary | ICD-10-CM | POA: Diagnosis not present

## 2020-03-20 DIAGNOSIS — F331 Major depressive disorder, recurrent, moderate: Secondary | ICD-10-CM | POA: Diagnosis not present

## 2020-03-20 DIAGNOSIS — F5001 Anorexia nervosa, restricting type: Secondary | ICD-10-CM | POA: Diagnosis not present

## 2020-03-20 DIAGNOSIS — F411 Generalized anxiety disorder: Secondary | ICD-10-CM | POA: Diagnosis not present

## 2020-03-20 MED ORDER — ALBUTEROL SULFATE HFA 108 (90 BASE) MCG/ACT IN AERS: 2.0000 | INHALATION_SPRAY | Freq: Four times a day (QID) | RESPIRATORY_TRACT | 2 refills | Status: DC | PRN

## 2020-03-20 NOTE — Progress Notes (Signed)
Subjective:   PATIENT ID: Katelyn [name] GENDER: female DOB: 02/27/1976, MRN: [id number]   HPI  No chief complaint on file.  Reason for Visit: Follow-up for dyspnea  [name] is a 44 year old female with history of tachybradycardia syndrome, depression, anxiety, migraine, PCOS and small fiber neuropathy who presents for follow-up  She was initially evaluated by me on 11/22/2018.  Since 2019 after an exposure to bug spray, she had burning sensation in her lungs.  On exposures to weed killer in the fall time, she has had recurrent burning sensation in her lungs difficulty getting air in.  Associated with change in taste and smell.  On our initial visit she reported shortness of breath and chest "burning" when exposed to certain irritants in the last year. She reports that has been a persistent burning sensation that took 4-5 months before gradually improving however this spring, this symptom recurred with increased intensity when husband was putting down fertilizer in the yard. Now any irritant including laundry detergent, shampoos, hand sanitizer, deodarants and any scented products. If she removes the irritant (washing hands, removing clothes), it will take 30-60 minutes for the intensity of the burning will subside. She describes the irritation located in the throat and deep irritation feeling like it is in the lungs. However she has a constant low-level burn. She has switched to unscented products and this has helped.  She does not thing her dyspnea is not necessarily associated with this burning sensation. She has had shortness of breath for the last year. This worsens with exertion including ambulating in the house. She believes due to her deconditioning since her breast implant in October 2018 where afterwards she had severe chronic pain from unclear etiology. She has been evaluated for [facility] and recommended management of symptoms as work-up negative per patient.    Symbicort Since our last visit, she decreased her Symbicort to a few times a week and tolerating well. Overall feels her symptoms are well controlled. Will be triggered by cold air, hand sanitizer, laundry detergent and chemicals/fertilizers. She has limited exposures in the household but will occasionally run into her neighbor using new chemicals on their lawn. She will have lung burning and shortness of breath usually improved with symbicort and/or albuterol. Has not had an exacerbation since our last visit. She is up to date on her vaccines and waiting on her covid booster eligibility.   ACT 14  Social History: Former smoker.  5-pack-year history.  Quit in [place] exposures: As above  I have personally reviewed patient's past medical/family/social history/allergies/current medications.  Past Medical History:  Diagnosis Date  . Anemia   . Anorexia   . Anxiety   . Asthma    controlled with meds  . Benign juvenile melanoma   . Chronic headaches   . Colon polyps    found on colonoscopy 04/26/2012  . Complication of anesthesia    itching after epidural for c section  . Constipation   . Depression   . Depression    several suicide attempts, hospitaluzed in 2012 for this; hx pf ECT treatments ; pt sees Dr. Adele Schilder pyschiatrist  and doing well on medication  . Headache   . Heart murmur    as a child - no one mentions hearing murmur anymore  . Heartburn    no meds  . History of pneumonia    x 3  years ago - no recent problems  . Hx of blood clots   .  Neuromuscular disorder ([place])    small fiber neuropathy  . Obesity   . Pneumonia   . Polycystic ovary    takes metformin to treat  . Skin lesion    Excisional biopsy of moles - none cancerous  . Sleep apnea    yrs ago - diagnosed mild sleep apnea - did not have to use cpap     Allergies  Allergen Reactions  . Ciprofloxacin Other (See Comments)    Nerve pain  Nerve pain  Nerve pain   . Duloxetine Hcl  Anaphylaxis    Make her more depressed, having nausea, vomiting, headache and threw up. Make her more depressed, having nausea, vomiting, headache and threw up.  Recardo Evangelist [Pregabalin] Anaphylaxis  . Feraheme [Ferumoxytol] Hives  . Penicillins     Has patient had a PCN reaction causing immediate rash, facial/tongue/throat swelling, SOB or lightheadedness with hypotension: Yes Has patient had a PCN reaction causing severe rash involving mucus membranes or skin necrosis: No Has patient had a PCN reaction that required hospitalization No Has patient had a PCN reaction occurring within the last 10 years: No If all of the above answers are "NO", then may proceed with Cephalosporin use.     REACTION: Rash  . Propofol   . Vit B12-Methionine-Inos-Chol      Outpatient Medications Prior to Visit  Medication Sig Dispense Refill  . Ascorbic Acid (VITAMIN C) 100 MG tablet Take 100 mg by mouth daily.    . budesonide-formoterol (SYMBICORT) 80-4.5 MCG/ACT inhaler Inhale 2 puffs into the lungs 2 (two) times daily. 1 Inhaler 5  . buPROPion (WELLBUTRIN) 75 MG tablet Take 2 tab in AM and one in afternoon 90 tablet 1  . calcium carbonate (OS-CAL - DOSED IN MG OF ELEMENTAL CALCIUM) 1250 (500 Ca) MG tablet Take 1 tablet by mouth.    . cholecalciferol (VITAMIN D3) 25 MCG (1000 UT) tablet Take 1,000 Units by mouth daily.    . clonazePAM (KLONOPIN) 0.5 MG tablet Take 1 tablet (0.5 mg total) by mouth 3 (three) times daily as needed for anxiety. 90 tablet 1  . Cyanocobalamin (B-12) 1000 MCG SUBL Place 1,000 mcg under the tongue daily. 30 each 6  . gabapentin (NEURONTIN) 600 MG tablet Take 600 mg by mouth 3 (three) times daily.    Marland Kitchen HYDROcodone-acetaminophen (NORCO) 7.5-325 MG tablet Take 1 tablet by mouth every 6 (six) hours as needed for moderate pain.    . Lactobacillus Rhamnosus, GG, (CULTURELLE PO) Take by mouth.    . magnesium oxide (MAG-OX) 400 MG tablet Take 400 mg by mouth daily.    . miconazole  (MONISTAT 7) 2 % vaginal cream Place 1 Applicatorful vaginally at bedtime. Apply once daily to affected area for 1 week , then , as needed 45 g 1  . Multiple Vitamin (MULTIVITAMIN WITH MINERALS) TABS tablet Take 3 tablets by mouth 2 (two) times daily.     . naloxone (NARCAN) nasal spray 4 mg/0.1 mL Place into the nose as needed.     . ondansetron (ZOFRAN) 4 MG tablet Take 1 tablet (4 mg total) by mouth every 8 (eight) hours as needed for nausea or vomiting. Take one tablet by mouth 4 times weekly, as needed, for nausea 16 tablet 6  . phentermine 37.5 MG capsule Take 1 capsule (37.5 mg total) by mouth every morning. 30 capsule 1  . PREMARIN vaginal cream 2 times a week    . temazepam (RESTORIL) 30 MG capsule Take 1 capsule (30 mg  total) by mouth at bedtime. 30 capsule 1  . tiZANidine (ZANAFLEX) 4 MG tablet Take 4 mg by mouth at bedtime.     . Zinc 100 MG TABS Take 1 tablet by mouth daily.    . ziprasidone (GEODON) 80 MG capsule Take 1 capsule (80 mg total) by mouth at bedtime. 30 capsule 1  . albuterol (VENTOLIN HFA) 108 (90 Base) MCG/ACT inhaler Inhale 2 puffs into the lungs every 6 (six) hours as needed for wheezing or shortness of breath. 8 g 6  . metoprolol tartrate (LOPRESSOR) 25 MG tablet Take 0.5 tablets (12.5 mg total) by mouth 2 (two) times daily as needed. 180 tablet 3   No facility-administered medications prior to visit.    Review of Systems  Constitutional: Negative for chills, diaphoresis, fever, malaise/fatigue and weight loss.  HENT: Negative for congestion.   Respiratory: Negative for cough, hemoptysis, sputum production, shortness of breath and wheezing.   Cardiovascular: Negative for chest pain, palpitations and leg swelling.    Objective:   Vitals:   03/20/20 1027  BP: 114/68  Pulse: 89  Temp: (!) 97.2 F (36.2 C)  TempSrc: Tympanic  SpO2: 99%   SpO2: 99 %   Physical Exam: General: Well-appearing, no acute distress HENT: [place], AT Eyes: EOMI, no scleral  icterus Respiratory: Clear to auscultation bilaterally.  No crackles, wheezing or rales Cardiovascular: RRR, -M/R/G, no JVD Extremities:-Edema,-tenderness Neuro: AAO x4, CNII-XII grossly intact Skin: Intact, no rashes or bruising Psych: Normal mood, normal affect  Data Reviewed:  Imaging: CXR 08/04/2017-no acute cardiopulmonary abnormalities.  Mild hyperinflation without flattening of the diaphragms CXR 12/09/2016-mild hyperinflation.  No infiltrates, effusion or edema.  PFT: None on file  Labs: CBC and CMP 11/02/2018-unremarkable.  Eosinophils 100    Assessment & Plan:  Discussion: 44 year old female who presents for follow-up of irritant induced asthma. Well controlled on intermittent ICS/LABA. Not in active exacerbation.  Irritant-induced asthma  Medications:  CONTINUE Symbicort 80-4.5 mcg up to ONE puff twice a day.    REFILL Albuterol 2 puffs as needed every 4-6 hours for shortness of breath or wheezing. THIS IS RESCUE INHALER   CONTINUE to minimize exposures  Asthma Action Plan:  Take Albuterol as needed for shortness of breath. If symptoms persistent including coughing, shortness of breath or wheezing, please call office to consider short-course of steroids (prednisone 40mg  x 5d).   [place] to start cutting back on Symbicort.  Take ONE puff of Symbicort AS NEEDED for shortness of breath and wheezing.  If symptoms persist after 48-72 hours, I recommend restarting Symbicort ONE puff twice a day again  Please call our office for any concerns or questions   Health Maintenance Immunization History  Administered Date(s) Administered  . Influenza Split 03/03/2012, 02/18/2015  . Influenza,inj,Quad PF,6+ Mos 01/31/2016, 03/03/2017, 01/13/2018, 01/04/2019  . Influenza-Unspecified 02/07/2020  . Moderna SARS-COVID-2 Vaccination 07/14/2019, 08/12/2019  . PPD Test 12/27/2014  . Tdap 03/31/2011   No orders of the defined types were placed in this encounter.  Meds  ordered this encounter  Medications  . albuterol (VENTOLIN HFA) 108 (90 Base) MCG/ACT inhaler    Sig: Inhale 2 puffs into the lungs every 6 (six) hours as needed for wheezing or shortness of breath.    Dispense:  8 g    Refill:  2   Return in about 1 year (around 03/20/2021).   [place], MD [facility] 03/20/2020 10:40 AM  Office Number [phone]

## 2020-03-20 NOTE — Patient Instructions (Signed)
Irritant-induced asthma  Medications:  CONTINUE Symbicort 80-4.5 mcg up to ONE puff twice a day.    REFILL Albuterol 2 puffs as needed every 4-6 hours for shortness of breath or wheezing. THIS IS RESCUE INHALER   CONTINUE to minimize exposures  Asthma Action Plan:  Take Albuterol as needed for shortness of breath. If symptoms persistent including coughing, shortness of breath or wheezing, please call office to consider short-course of steroids (prednisone 40mg  x 5d).   [place] to start cutting back on Symbicort.  Take ONE puff of Symbicort AS NEEDED for shortness of breath and wheezing.  If symptoms persist after 48-72 hours, I recommend restarting Symbicort ONE puff twice a day again  Please call our office for any concerns or questions  Follow-up in 1 year

## 2020-03-21 DIAGNOSIS — N3943 Post-void dribbling: Secondary | ICD-10-CM | POA: Diagnosis not present

## 2020-03-21 DIAGNOSIS — R3911 Hesitancy of micturition: Secondary | ICD-10-CM | POA: Diagnosis not present

## 2020-03-21 DIAGNOSIS — M62838 Other muscle spasm: Secondary | ICD-10-CM | POA: Diagnosis not present

## 2020-03-21 DIAGNOSIS — R208 Other disturbances of skin sensation: Secondary | ICD-10-CM | POA: Diagnosis not present

## 2020-03-21 DIAGNOSIS — M6281 Muscle weakness (generalized): Secondary | ICD-10-CM | POA: Diagnosis not present

## 2020-03-23 DIAGNOSIS — N3943 Post-void dribbling: Secondary | ICD-10-CM | POA: Diagnosis not present

## 2020-03-23 DIAGNOSIS — M6281 Muscle weakness (generalized): Secondary | ICD-10-CM | POA: Diagnosis not present

## 2020-03-23 DIAGNOSIS — R3911 Hesitancy of micturition: Secondary | ICD-10-CM | POA: Diagnosis not present

## 2020-03-23 DIAGNOSIS — R208 Other disturbances of skin sensation: Secondary | ICD-10-CM | POA: Diagnosis not present

## 2020-03-23 DIAGNOSIS — M62838 Other muscle spasm: Secondary | ICD-10-CM | POA: Diagnosis not present

## 2020-03-29 DIAGNOSIS — M6281 Muscle weakness (generalized): Secondary | ICD-10-CM | POA: Diagnosis not present

## 2020-03-29 DIAGNOSIS — N3943 Post-void dribbling: Secondary | ICD-10-CM | POA: Diagnosis not present

## 2020-03-29 DIAGNOSIS — R3911 Hesitancy of micturition: Secondary | ICD-10-CM | POA: Diagnosis not present

## 2020-03-29 DIAGNOSIS — M62838 Other muscle spasm: Secondary | ICD-10-CM | POA: Diagnosis not present

## 2020-03-29 DIAGNOSIS — R208 Other disturbances of skin sensation: Secondary | ICD-10-CM | POA: Diagnosis not present

## 2020-04-02 ENCOUNTER — Telehealth (HOSPITAL_COMMUNITY): Payer: PPO | Admitting: Psychiatry

## 2020-04-03 DIAGNOSIS — F331 Major depressive disorder, recurrent, moderate: Secondary | ICD-10-CM | POA: Diagnosis not present

## 2020-04-03 DIAGNOSIS — F411 Generalized anxiety disorder: Secondary | ICD-10-CM | POA: Diagnosis not present

## 2020-04-03 DIAGNOSIS — F5001 Anorexia nervosa, restricting type: Secondary | ICD-10-CM | POA: Diagnosis not present

## 2020-04-05 ENCOUNTER — Encounter (HOSPITAL_COMMUNITY): Payer: Self-pay | Admitting: Psychiatry

## 2020-04-05 ENCOUNTER — Telehealth (HOSPITAL_COMMUNITY): Payer: Self-pay | Admitting: *Deleted

## 2020-04-05 ENCOUNTER — Telehealth (INDEPENDENT_AMBULATORY_CARE_PROVIDER_SITE_OTHER): Payer: PPO | Admitting: Psychiatry

## 2020-04-05 ENCOUNTER — Other Ambulatory Visit: Payer: Self-pay

## 2020-04-05 VITALS — Wt 179.0 lb

## 2020-04-05 DIAGNOSIS — F509 Eating disorder, unspecified: Secondary | ICD-10-CM | POA: Diagnosis not present

## 2020-04-05 DIAGNOSIS — F331 Major depressive disorder, recurrent, moderate: Secondary | ICD-10-CM

## 2020-04-05 DIAGNOSIS — F411 Generalized anxiety disorder: Secondary | ICD-10-CM | POA: Diagnosis not present

## 2020-04-05 MED ORDER — ZIPRASIDONE HCL 80 MG PO CAPS: 80.0000 mg | ORAL_CAPSULE | Freq: Every day | ORAL | 1 refills | Status: DC

## 2020-04-05 MED ORDER — LISDEXAMFETAMINE DIMESYLATE 20 MG PO CAPS: 20.0000 mg | ORAL_CAPSULE | Freq: Every day | ORAL | 0 refills | Status: DC

## 2020-04-05 MED ORDER — CLONAZEPAM 0.5 MG PO TABS: 0.5000 mg | ORAL_TABLET | Freq: Three times a day (TID) | ORAL | 1 refills | Status: DC | PRN

## 2020-04-05 MED ORDER — BUPROPION HCL 75 MG PO TABS: ORAL_TABLET | ORAL | 1 refills | Status: DC

## 2020-04-05 MED ORDER — TEMAZEPAM 30 MG PO CAPS: 30.0000 mg | ORAL_CAPSULE | Freq: Every day | ORAL | 1 refills | Status: DC

## 2020-04-05 NOTE — Telephone Encounter (Signed)
Prior authorization obtained for Vyvance 20mg  q am.  Medication is authorized until 04/05/2021.

## 2020-04-05 NOTE — Progress Notes (Signed)
Virtual Visit via Telephone Note  I connected with Theia Dezeeuw Zehren on 04/05/20 at  9:20 AM EST by telephone and verified that I am speaking with the correct person using two identifiers.  Location: Patient: Home Provider: Home Office   I discussed the limitations, risks, security and privacy concerns of performing an evaluation and management service by telephone and the availability of in person appointments. I also discussed with the patient that there may be a patient responsible charge related to this service. The patient expressed understanding and agreed to proceed.   History of Present Illness: Patient is evaluated by phone session.  She is on the phone by herself.  She reported increased anxiety because her daughter medicine who was admitted in a [facility] did not do well there and she decided to take her back home.  Patient told that it was very stressful and she noticed daughter is getting deteriorated there.  Her daughter diagnosed with eating disorder.  Patient is relieved that her daughter is now at home.  She feels her paranoia is not as bad as since taking Geodon 80 mg at bedtime.  She tried phentermine prescribed by Dr. Wolfgang Phoenix but after 3 days she had to stop because of the chest pain.  She feels that it helped her a lot and she feels very great because she has no focus, energy, she was not binge eating and her mood was much better.  She feels very disappointed that she had to stop because of chest pain.  She like to try something else because she continues to have craving and impulsive eating.  She gained 2 pounds since the last visit.  She is taking multiple medication for her pain, anxiety, depression.  She noticed phentermine may cause some insomnia but since she stopped the phentermine her sleep is now back to baseline.  She is in therapy with her therapit [facility].  Patient denies any crying spells, feeling of hopelessness or any suicidal thoughts.  She denies any  anger or any mania.  , she is now at home   Past Psychiatric History: H/Omultipleinpatient.Last admission Feb 2015. Good response with ECT but scared to continue maintenance ECT. Tried Cymbalta(suicidal thoughts), Lexapro, Abilify, lithium, Wellbutrin, Lamictal, Ritalin, Remeron, Risperdal, Neurontin, Vistaril, BuSpar, Prozac and Valium.   Psychiatric Specialty Exam: Physical Exam  Review of Systems  Weight 179 lb (81.2 kg).There is no height or weight on file to calculate BMI.  General Appearance: NA  Eye Contact:  NA  Speech:  Slow  Volume:  Decreased  Mood:  Dysphoric  Affect:  NA  Thought Process:  Descriptions of Associations: Intact  Orientation:  Full (Time, Place, and Person)  Thought Content:  Rumination  Suicidal Thoughts:  No  Homicidal Thoughts:  No  Memory:  Immediate;   Good Recent;   Good Remote;   Good  Judgement:  Intact  Insight:  Shallow  Psychomotor Activity:  NA  Concentration:  Concentration: Fair and Attention Span: Fair  Recall:  Good  Fund of Knowledge:  Good  Language:  Good  Akathisia:  No  Handed:  Right  AIMS (if indicated):     Assets:  Communication Skills Desire for Improvement Housing Social Support Transportation  ADL's:  Intact  Cognition:  WNL  Sleep:   ok      Assessment and Plan: Eating disorder.  Major depressive disorder, recurrent.  Generalized anxiety disorder.  I reviewed notes from her PCP.  She is no longer taking phentermine because  of chest pain.  She also tried higher dose of Wellbutrin but that did not help.  I recommend to go back on Wellbutrin 75 mg to take 2 in the morning and not to take third in the afternoon.  We will try low-dose Vyvanse 20 mg to help her craving for feeding.  However I reminded that it may cause worsening of anxiety, insomnia and in that case she need to stop the medication immediately.  She agreed with the plan.  She like to keep her current medication which is helping her depression,  sleep and paranoia.  Encouraged to continue therapy.  I will continue temazepam 30 mg at bedtime, Geodon 80 mg at bedtime, Klonopin 0.5 mg up to 3 times a day, Wellbutrin 75 mg 2 tablet in the morning and now Vyvanse 20 mg daily.  We discussed benzodiazepines and stimulants abuse, tolerance, withdrawal.  Recommended to call us back if she is any question, concern if she feels worsening of the symptom.  Follow-up in 2 months.  I will forward my note to Dr. Wolfgang Phoenix.   Follow Up Instructions:    I discussed the assessment and treatment plan with the patient. The patient was provided an opportunity to ask questions and all were answered. The patient agreed with the plan and demonstrated an understanding of the instructions.   The patient was advised to call back or seek an in-person evaluation if the symptoms worsen or if the condition fails to improve as anticipated.  I provided 25 minutes of non-face-to-face time during this encounter.   Kathlee Nations, MD

## 2020-04-06 ENCOUNTER — Other Ambulatory Visit (HOSPITAL_COMMUNITY): Payer: Self-pay

## 2020-04-06 DIAGNOSIS — D508 Other iron deficiency anemias: Secondary | ICD-10-CM

## 2020-04-06 DIAGNOSIS — D513 Other dietary vitamin B12 deficiency anemia: Secondary | ICD-10-CM

## 2020-04-09 ENCOUNTER — Inpatient Hospital Stay (HOSPITAL_COMMUNITY): Payer: PPO | Attending: Hematology and Oncology

## 2020-04-09 ENCOUNTER — Other Ambulatory Visit: Payer: Self-pay

## 2020-04-09 DIAGNOSIS — D508 Other iron deficiency anemias: Secondary | ICD-10-CM

## 2020-04-09 DIAGNOSIS — D513 Other dietary vitamin B12 deficiency anemia: Secondary | ICD-10-CM

## 2020-04-09 DIAGNOSIS — E538 Deficiency of other specified B group vitamins: Secondary | ICD-10-CM | POA: Insufficient documentation

## 2020-04-09 DIAGNOSIS — E611 Iron deficiency: Secondary | ICD-10-CM | POA: Diagnosis not present

## 2020-04-09 LAB — CBC WITH DIFFERENTIAL/PLATELET
Abs Immature Granulocytes: 0.01 10*3/uL (ref 0.00–0.07)
Basophils Absolute: 0 10*3/uL (ref 0.0–0.1)
Basophils Relative: 0 %
Eosinophils Absolute: 0 10*3/uL (ref 0.0–0.5)
Eosinophils Relative: 1 %
HCT: 44.4 % (ref 36.0–46.0)
Hemoglobin: 14.4 g/dL (ref 12.0–15.0)
Immature Granulocytes: 0 %
Lymphocytes Relative: 27 %
Lymphs Abs: 1.9 10*3/uL (ref 0.7–4.0)
MCH: 33.3 pg (ref 26.0–34.0)
MCHC: 32.4 g/dL (ref 30.0–36.0)
MCV: 102.5 fL — ABNORMAL HIGH (ref 80.0–100.0)
Monocytes Absolute: 0.4 10*3/uL (ref 0.1–1.0)
Monocytes Relative: 6 %
Neutro Abs: 4.6 10*3/uL (ref 1.7–7.7)
Neutrophils Relative %: 66 %
Platelets: 277 10*3/uL (ref 150–400)
RBC: 4.33 MIL/uL (ref 3.87–5.11)
RDW: 12.2 % (ref 11.5–15.5)
WBC: 6.9 10*3/uL (ref 4.0–10.5)
nRBC: 0 % (ref 0.0–0.2)

## 2020-04-09 LAB — LACTATE DEHYDROGENASE: LDH: 175 U/L (ref 98–192)

## 2020-04-09 LAB — COMPREHENSIVE METABOLIC PANEL
ALT: 29 U/L (ref 0–44)
AST: 28 U/L (ref 15–41)
Albumin: 4.7 g/dL (ref 3.5–5.0)
Alkaline Phosphatase: 76 U/L (ref 38–126)
Anion gap: 11 (ref 5–15)
BUN: 13 mg/dL (ref 6–20)
CO2: 27 mmol/L (ref 22–32)
Calcium: 9.3 mg/dL (ref 8.9–10.3)
Chloride: 98 mmol/L (ref 98–111)
Creatinine, Ser: 0.76 mg/dL (ref 0.44–1.00)
GFR, Estimated: >60 mL/min (ref >60)
Glucose, Bld: 91 mg/dL (ref 70–99)
Potassium: 4.1 mmol/L (ref 3.5–5.1)
Sodium: 136 mmol/L (ref 135–145)
Total Bilirubin: 0.5 mg/dL (ref 0.3–1.2)
Total Protein: 7.5 g/dL (ref 6.5–8.1)

## 2020-04-09 LAB — IRON AND TIBC
Iron: 51 ug/dL (ref 28–170)
Saturation Ratios: 14 % (ref 10.4–31.8)
TIBC: 355 ug/dL (ref 250–450)
UIBC: 304 ug/dL

## 2020-04-09 LAB — VITAMIN D 25 HYDROXY (VIT D DEFICIENCY, FRACTURES): Vit D, 25-Hydroxy: 62.66 ng/mL (ref 30–100)

## 2020-04-09 LAB — FERRITIN: Ferritin: 39 ng/mL (ref 11–307)

## 2020-04-09 LAB — FOLATE: Folate: 48.3 ng/mL (ref >5.9)

## 2020-04-09 LAB — VITAMIN B12: Vitamin B-12: 1275 pg/mL — ABNORMAL HIGH (ref 180–914)

## 2020-04-10 ENCOUNTER — Telehealth (HOSPITAL_COMMUNITY): Payer: Self-pay | Admitting: *Deleted

## 2020-04-10 NOTE — Telephone Encounter (Signed)
FYI pt called to report that that she is tolerating the VyVanse and it is working well.

## 2020-04-11 LAB — COPPER, SERUM: Copper: 144 ug/dL (ref 80–158)

## 2020-04-12 DIAGNOSIS — R3911 Hesitancy of micturition: Secondary | ICD-10-CM | POA: Diagnosis not present

## 2020-04-12 DIAGNOSIS — M6281 Muscle weakness (generalized): Secondary | ICD-10-CM | POA: Diagnosis not present

## 2020-04-12 DIAGNOSIS — R208 Other disturbances of skin sensation: Secondary | ICD-10-CM | POA: Diagnosis not present

## 2020-04-12 DIAGNOSIS — N3943 Post-void dribbling: Secondary | ICD-10-CM | POA: Diagnosis not present

## 2020-04-12 DIAGNOSIS — M62838 Other muscle spasm: Secondary | ICD-10-CM | POA: Diagnosis not present

## 2020-04-14 LAB — METHYLMALONIC ACID, SERUM: Methylmalonic Acid, Quantitative: 138 nmol/L (ref 0–378)

## 2020-04-16 ENCOUNTER — Telehealth (HOSPITAL_COMMUNITY): Payer: PPO | Admitting: Hematology

## 2020-04-17 DIAGNOSIS — F411 Generalized anxiety disorder: Secondary | ICD-10-CM | POA: Diagnosis not present

## 2020-04-17 DIAGNOSIS — F331 Major depressive disorder, recurrent, moderate: Secondary | ICD-10-CM | POA: Diagnosis not present

## 2020-04-17 DIAGNOSIS — F5001 Anorexia nervosa, restricting type: Secondary | ICD-10-CM | POA: Diagnosis not present

## 2020-04-19 DIAGNOSIS — N3943 Post-void dribbling: Secondary | ICD-10-CM | POA: Diagnosis not present

## 2020-04-19 DIAGNOSIS — R208 Other disturbances of skin sensation: Secondary | ICD-10-CM | POA: Diagnosis not present

## 2020-04-19 DIAGNOSIS — M62838 Other muscle spasm: Secondary | ICD-10-CM | POA: Diagnosis not present

## 2020-04-19 DIAGNOSIS — M6281 Muscle weakness (generalized): Secondary | ICD-10-CM | POA: Diagnosis not present

## 2020-04-19 DIAGNOSIS — R3911 Hesitancy of micturition: Secondary | ICD-10-CM | POA: Diagnosis not present

## 2020-04-23 ENCOUNTER — Encounter (HOSPITAL_COMMUNITY): Payer: Self-pay | Admitting: Hematology

## 2020-04-23 ENCOUNTER — Inpatient Hospital Stay (HOSPITAL_BASED_OUTPATIENT_CLINIC_OR_DEPARTMENT_OTHER): Payer: PPO | Admitting: Hematology

## 2020-04-23 DIAGNOSIS — E611 Iron deficiency: Secondary | ICD-10-CM

## 2020-04-23 DIAGNOSIS — E538 Deficiency of other specified B group vitamins: Secondary | ICD-10-CM | POA: Diagnosis not present

## 2020-04-23 NOTE — Progress Notes (Signed)
Virtual Visit via Telephone Note  I connected with Katelyn [name] on 04/23/20 at  4:15 PM EST by telephone and verified that I am speaking with the correct person using two identifiers.  Location: Patient: At home Provider: In the office   I discussed the limitations, risks, security and privacy concerns of performing an evaluation and management service by telephone and the availability of in person appointments. I also discussed with the patient that there may be a patient responsible charge related to this service. The patient expressed understanding and agreed to proceed.   History of Present Illness: She is followed in our clinic for iron and vitamin B12 deficiency secondary to malabsorption resulting from gastric bypass surgery and chronic PPI therapy.   Observations/Objective: She reports that she stopped taking vitamin B12  3 months ago as her levels were very high.  She complains of feeling tired.  Denies any bleeding per rectum or melena.  Assessment and Plan:  1.  Iron deficiency state: -Reviewed labs from 04/09/2020.  Ferritin continues to decrease, current level at 39. -Recommend 1 infusion of Injectafer. -Patient reportedly had developed fever a day after last infusion.  Prior to that she tolerated them very well. -Given her multiple medication allergies and reaction to Feraheme in the past, she was recommended to take Tylenol and Benadryl at home on the day of infusion. -She will also receive premedication with 125 mg of Solu-Medrol prior to Injectafer.  We will order 1 infusion of Injectafer. -RTC 4 months with repeat labs.  2.  B12 deficiency: -Stop taking vitamin B12 3 months ago as her levels were high.  Her latest B12 is 1275.  It is down from 4119 on 12/30/2019. -She has slight microcytosis with MCV of 102.5 with normal hemoglobin 14.4.  Folic acid was normal.   Follow Up Instructions: Schedule for Injectafer. RTC 4 months with labs 1 day prior.   I  discussed the assessment and treatment plan with the patient. The patient was provided an opportunity to ask questions and all were answered. The patient agreed with the plan and demonstrated an understanding of the instructions.   The patient was advised to call back or seek an in-person evaluation if the symptoms worsen or if the condition fails to improve as anticipated.  I provided 12 minutes of non-face-to-face time during this encounter.   Doreatha Massed, MD

## 2020-04-26 DIAGNOSIS — R3911 Hesitancy of micturition: Secondary | ICD-10-CM | POA: Diagnosis not present

## 2020-04-26 DIAGNOSIS — M6281 Muscle weakness (generalized): Secondary | ICD-10-CM | POA: Diagnosis not present

## 2020-04-26 DIAGNOSIS — M62838 Other muscle spasm: Secondary | ICD-10-CM | POA: Diagnosis not present

## 2020-04-26 DIAGNOSIS — R208 Other disturbances of skin sensation: Secondary | ICD-10-CM | POA: Diagnosis not present

## 2020-04-26 DIAGNOSIS — N3943 Post-void dribbling: Secondary | ICD-10-CM | POA: Diagnosis not present

## 2020-04-30 ENCOUNTER — Telehealth (HOSPITAL_COMMUNITY): Payer: Self-pay | Admitting: *Deleted

## 2020-04-30 DIAGNOSIS — F5001 Anorexia nervosa, restricting type: Secondary | ICD-10-CM | POA: Diagnosis not present

## 2020-04-30 DIAGNOSIS — F509 Eating disorder, unspecified: Secondary | ICD-10-CM

## 2020-04-30 DIAGNOSIS — F331 Major depressive disorder, recurrent, moderate: Secondary | ICD-10-CM | POA: Diagnosis not present

## 2020-04-30 DIAGNOSIS — F411 Generalized anxiety disorder: Secondary | ICD-10-CM | POA: Diagnosis not present

## 2020-04-30 NOTE — Telephone Encounter (Signed)
Pt called requesting refill of the Vyvanse 20 mg. Pt has an upcoming appointment on 06/01/20. Please review.

## 2020-05-01 DIAGNOSIS — F411 Generalized anxiety disorder: Secondary | ICD-10-CM | POA: Diagnosis not present

## 2020-05-01 DIAGNOSIS — F5001 Anorexia nervosa, restricting type: Secondary | ICD-10-CM | POA: Diagnosis not present

## 2020-05-01 DIAGNOSIS — F331 Major depressive disorder, recurrent, moderate: Secondary | ICD-10-CM | POA: Diagnosis not present

## 2020-05-01 MED ORDER — LISDEXAMFETAMINE DIMESYLATE 20 MG PO CAPS
20.0000 mg | ORAL_CAPSULE | Freq: Every day | ORAL | 0 refills | Status: DC
Start: 2020-05-01 — End: 2020-06-01

## 2020-05-01 NOTE — Telephone Encounter (Signed)
Done.  However not due until January 9.  She can pick up when she is due from her pharmacy.

## 2020-05-03 DIAGNOSIS — M62838 Other muscle spasm: Secondary | ICD-10-CM | POA: Diagnosis not present

## 2020-05-03 DIAGNOSIS — M6281 Muscle weakness (generalized): Secondary | ICD-10-CM | POA: Diagnosis not present

## 2020-05-03 DIAGNOSIS — N3943 Post-void dribbling: Secondary | ICD-10-CM | POA: Diagnosis not present

## 2020-05-03 DIAGNOSIS — R3911 Hesitancy of micturition: Secondary | ICD-10-CM | POA: Diagnosis not present

## 2020-05-03 DIAGNOSIS — R208 Other disturbances of skin sensation: Secondary | ICD-10-CM | POA: Diagnosis not present

## 2020-05-04 ENCOUNTER — Inpatient Hospital Stay (HOSPITAL_COMMUNITY): Payer: PPO | Attending: Hematology and Oncology

## 2020-05-04 ENCOUNTER — Encounter (HOSPITAL_COMMUNITY): Payer: Self-pay

## 2020-05-04 VITALS — BP 118/69 | HR 69 | Temp 97.3°F | Resp 18

## 2020-05-04 DIAGNOSIS — D513 Other dietary vitamin B12 deficiency anemia: Secondary | ICD-10-CM

## 2020-05-04 DIAGNOSIS — E611 Iron deficiency: Secondary | ICD-10-CM | POA: Insufficient documentation

## 2020-05-04 DIAGNOSIS — Z79899 Other long term (current) drug therapy: Secondary | ICD-10-CM | POA: Insufficient documentation

## 2020-05-04 MED ORDER — METHYLPREDNISOLONE SODIUM SUCC 125 MG IJ SOLR
125.0000 mg | Freq: Once | INTRAMUSCULAR | Status: AC
  Administered 2020-05-04: 125 mg via INTRAVENOUS
  Filled 2020-05-04: qty 2

## 2020-05-04 MED ORDER — SODIUM CHLORIDE 0.9 % IV SOLN: Freq: Once | INTRAVENOUS | Status: AC

## 2020-05-04 MED ORDER — SODIUM CHLORIDE 0.9 % IV SOLN
750.0000 mg | Freq: Once | INTRAVENOUS | Status: AC
  Administered 2020-05-04: 750 mg via INTRAVENOUS
  Filled 2020-05-04: qty 15

## 2020-05-04 NOTE — Progress Notes (Signed)
Patient took Benadryl 25 mg and Hydrocodone for Iron infusion due to history of hives.  Patient given solumedrol for premedication in clinic.  No s/s of distress noted.  Pharmacy aware.    Patient tolerated iron infusion with no complaints voiced.  Peripheral IV site clean and dry with good blood return noted before and after infusion.  Band aid applied.  VSS with discharge and left in satisfactory condition with no s/s of distress noted.

## 2020-05-08 ENCOUNTER — Telehealth (HOSPITAL_COMMUNITY): Payer: Self-pay | Admitting: Surgery

## 2020-05-08 ENCOUNTER — Encounter (HOSPITAL_COMMUNITY): Payer: Self-pay

## 2020-05-08 NOTE — Telephone Encounter (Signed)
Pt had left a voicemail this morning stating that she was having chills, nausea, and a fever after her iron infusion on Friday.  The pt is concerned that the reaction is from the infusion.  Dr. Delton Coombes was notified of the pt's symptoms, and stated the pt needs to see her primary MD or go to urgent care.  Per Dr. Delton Coombes, reactions from an iron infusion only last 1-2 days.  I left the pt a voicemail with these instructions and told her to call our office back if she had any questions.

## 2020-05-10 ENCOUNTER — Other Ambulatory Visit: Payer: PPO

## 2020-05-10 ENCOUNTER — Other Ambulatory Visit (HOSPITAL_COMMUNITY): Payer: Self-pay | Admitting: *Deleted

## 2020-05-10 DIAGNOSIS — D509 Iron deficiency anemia, unspecified: Secondary | ICD-10-CM

## 2020-05-12 DIAGNOSIS — Z20822 Contact with and (suspected) exposure to covid-19: Secondary | ICD-10-CM | POA: Diagnosis not present

## 2020-05-21 DIAGNOSIS — F411 Generalized anxiety disorder: Secondary | ICD-10-CM | POA: Diagnosis not present

## 2020-05-21 DIAGNOSIS — F331 Major depressive disorder, recurrent, moderate: Secondary | ICD-10-CM | POA: Diagnosis not present

## 2020-05-21 DIAGNOSIS — F5001 Anorexia nervosa, restricting type: Secondary | ICD-10-CM | POA: Diagnosis not present

## 2020-05-24 DIAGNOSIS — N3943 Post-void dribbling: Secondary | ICD-10-CM | POA: Diagnosis not present

## 2020-05-24 DIAGNOSIS — M6281 Muscle weakness (generalized): Secondary | ICD-10-CM | POA: Diagnosis not present

## 2020-05-24 DIAGNOSIS — M62838 Other muscle spasm: Secondary | ICD-10-CM | POA: Diagnosis not present

## 2020-05-24 DIAGNOSIS — R208 Other disturbances of skin sensation: Secondary | ICD-10-CM | POA: Diagnosis not present

## 2020-05-24 DIAGNOSIS — R3911 Hesitancy of micturition: Secondary | ICD-10-CM | POA: Diagnosis not present

## 2020-05-31 DIAGNOSIS — R208 Other disturbances of skin sensation: Secondary | ICD-10-CM | POA: Diagnosis not present

## 2020-05-31 DIAGNOSIS — M6281 Muscle weakness (generalized): Secondary | ICD-10-CM | POA: Diagnosis not present

## 2020-05-31 DIAGNOSIS — N3943 Post-void dribbling: Secondary | ICD-10-CM | POA: Diagnosis not present

## 2020-05-31 DIAGNOSIS — R3911 Hesitancy of micturition: Secondary | ICD-10-CM | POA: Diagnosis not present

## 2020-05-31 DIAGNOSIS — M62838 Other muscle spasm: Secondary | ICD-10-CM | POA: Diagnosis not present

## 2020-06-01 ENCOUNTER — Telehealth (INDEPENDENT_AMBULATORY_CARE_PROVIDER_SITE_OTHER): Payer: PPO | Admitting: Psychiatry

## 2020-06-01 ENCOUNTER — Other Ambulatory Visit: Payer: Self-pay

## 2020-06-01 ENCOUNTER — Encounter (HOSPITAL_COMMUNITY): Payer: Self-pay | Admitting: Psychiatry

## 2020-06-01 DIAGNOSIS — F509 Eating disorder, unspecified: Secondary | ICD-10-CM | POA: Diagnosis not present

## 2020-06-01 DIAGNOSIS — F331 Major depressive disorder, recurrent, moderate: Secondary | ICD-10-CM

## 2020-06-01 DIAGNOSIS — F411 Generalized anxiety disorder: Secondary | ICD-10-CM

## 2020-06-01 MED ORDER — TEMAZEPAM 30 MG PO CAPS
30.0000 mg | ORAL_CAPSULE | Freq: Every day | ORAL | 1 refills | Status: DC
Start: 2020-06-01 — End: 2020-07-30

## 2020-06-01 MED ORDER — LISDEXAMFETAMINE DIMESYLATE 20 MG PO CAPS: 20.0000 mg | ORAL_CAPSULE | Freq: Every day | ORAL | 0 refills | Status: DC

## 2020-06-01 MED ORDER — CLONAZEPAM 0.5 MG PO TABS: 0.5000 mg | ORAL_TABLET | Freq: Three times a day (TID) | ORAL | 1 refills | Status: DC | PRN

## 2020-06-01 MED ORDER — ZIPRASIDONE HCL 80 MG PO CAPS: 80.0000 mg | ORAL_CAPSULE | Freq: Every day | ORAL | 1 refills | Status: DC

## 2020-06-01 MED ORDER — BUPROPION HCL 75 MG PO TABS: ORAL_TABLET | ORAL | 1 refills | Status: DC

## 2020-06-01 NOTE — Progress Notes (Signed)
Virtual Visit via Telephone Note  I connected with Katelyn [name] on 06/01/20 at 10:20 AM EST by telephone and verified that I am speaking with the correct person using two identifiers.  Location: Patient: Home Provider: Home Office   I discussed the limitations, risks, security and privacy concerns of performing an evaluation and management service by telephone and the availability of in person appointments. I also discussed with the patient that there may be a patient responsible charge related to this service. The patient expressed understanding and agreed to proceed.   History of Present Illness: Patient is evaluated by phone session.  On the last visit we started low-dose Vyvanse to help her eating disorder.  She noticed that she is somewhat better than before.  She is not binging during the day but months medication effects wear off she feels impulsive eating.  She is pleased that she lost 1 pound since the last visit.  We also recommend restart Wellbutrin which she has not taken in the past because she was taking phentermine.  Now she is not taking phentermine and restarted the Wellbutrin.  She feels her depression is a stable but she still have chronic anxiety which sometimes gets worsened.  She do not notice any worsening of anxiety with the Vyvanse.  She is sleeping good and sometimes she takes naps during the daytime.  She denies any crying spells, feeling of hopelessness or any suicidal thoughts.  Her daughter Katelyn [name] is back from the hospital before Christmas and she started school and doing better.  She is making good grades.  Patient still have paranoia especially anything related to government.  She did not feel her email address to get free testing kits.  She admitted that does not trust the government but that is chronic and not worsening.  She is in therapy with her therapist going and that is going well.  Now she is concerned because her ex and Paula's father had [place] around  Christmas and she was not able to visit him but now this weekend she may go and visit him and she is worried about COVID.  Patient required a lot of reassurance.    Past Psychiatric History: H/Omultipleinpatient.Last admission Feb 2015. Good response with ECT but scared to continue maintenance ECT. Tried Cymbalta(suicidal thoughts), Lexapro, Abilify, lithium, Wellbutrin, Lamictal, Ritalin, Remeron, Risperdal, Neurontin, Vistaril, BuSpar, Prozac and Valium.   Psychiatric Specialty Exam: Physical Exam  Review of Systems  Weight 178 lb (80.7 kg).There is no height or weight on file to calculate BMI.  General Appearance: NA  Eye Contact:  NA  Speech:  Normal Rate  Volume:  Normal  Mood:  Anxious  Affect:  NA  Thought Process:  Goal Directed  Orientation:  Full (Time, Place, and Person)  Thought Content:  Rumination  Suicidal Thoughts:  No  Homicidal Thoughts:  No  Memory:  Immediate;   Good Recent;   Good Remote;   Good  Judgement:  Intact  Insight:  Present  Psychomotor Activity:  NA  Concentration:  Concentration: Fair and Attention Span: Fair  Recall:  Good  Fund of Knowledge:  Good  Language:  Good  Akathisia:  No  Handed:  Right  AIMS (if indicated):     Assets:  Communication Skills Desire for Improvement Housing Resilience Social Support Transportation  ADL's:  Intact  Cognition:  WNL  Sleep:   ok      Assessment and Plan: Eating disorder, unspecified type.  Major depressive disorder, recurrent.  Generalized  anxiety disorder.  Patient showing mild improvement with low-dose Vyvanse.  Her anxiety and insomnia and paranoia did not get worse.  However she feels it lasts only for 5 hours.  We discussed increasing the stimulant may cause worsening of anxiety and insomnia and she agreed and like to keep the current dose.  She is also taking benzodiazepine and we discussed controlled substance policy, withdrawal, dependency and abuse.  She does not ask early  refills.  Continue Wellbutrin 75 mg, 2 tablet daily, Geodon 80 mg daily, temazepam 30 mg at bedtime, Klonopin 0.5 mg 3 times a day and Vyvanse 20 mg daily.  Recommended to call us back if she has any question or any concern.  Encouraged to keep therapy with going.  Follow-up in 2 months.  Follow Up Instructions:    I discussed the assessment and treatment plan with the patient. The patient was provided an opportunity to ask questions and all were answered. The patient agreed with the plan and demonstrated an understanding of the instructions.   The patient was advised to call back or seek an in-person evaluation if the symptoms worsen or if the condition fails to improve as anticipated.  I provided 22 minutes of non-face-to-face time during this encounter.   Kathlee Nations, MD

## 2020-06-05 DIAGNOSIS — R3911 Hesitancy of micturition: Secondary | ICD-10-CM | POA: Diagnosis not present

## 2020-06-05 DIAGNOSIS — R208 Other disturbances of skin sensation: Secondary | ICD-10-CM | POA: Diagnosis not present

## 2020-06-05 DIAGNOSIS — N3943 Post-void dribbling: Secondary | ICD-10-CM | POA: Diagnosis not present

## 2020-06-05 DIAGNOSIS — M6281 Muscle weakness (generalized): Secondary | ICD-10-CM | POA: Diagnosis not present

## 2020-06-05 DIAGNOSIS — M62838 Other muscle spasm: Secondary | ICD-10-CM | POA: Diagnosis not present

## 2020-06-07 DIAGNOSIS — F331 Major depressive disorder, recurrent, moderate: Secondary | ICD-10-CM | POA: Diagnosis not present

## 2020-06-07 DIAGNOSIS — F411 Generalized anxiety disorder: Secondary | ICD-10-CM | POA: Diagnosis not present

## 2020-06-07 DIAGNOSIS — F5001 Anorexia nervosa, restricting type: Secondary | ICD-10-CM | POA: Diagnosis not present

## 2020-06-12 DIAGNOSIS — M62838 Other muscle spasm: Secondary | ICD-10-CM | POA: Diagnosis not present

## 2020-06-12 DIAGNOSIS — M6281 Muscle weakness (generalized): Secondary | ICD-10-CM | POA: Diagnosis not present

## 2020-06-12 DIAGNOSIS — R3911 Hesitancy of micturition: Secondary | ICD-10-CM | POA: Diagnosis not present

## 2020-06-12 DIAGNOSIS — R208 Other disturbances of skin sensation: Secondary | ICD-10-CM | POA: Diagnosis not present

## 2020-06-12 DIAGNOSIS — N3943 Post-void dribbling: Secondary | ICD-10-CM | POA: Diagnosis not present

## 2020-06-18 DIAGNOSIS — F411 Generalized anxiety disorder: Secondary | ICD-10-CM | POA: Diagnosis not present

## 2020-06-18 DIAGNOSIS — F331 Major depressive disorder, recurrent, moderate: Secondary | ICD-10-CM | POA: Diagnosis not present

## 2020-06-18 DIAGNOSIS — F5001 Anorexia nervosa, restricting type: Secondary | ICD-10-CM | POA: Diagnosis not present

## 2020-06-19 DIAGNOSIS — M62838 Other muscle spasm: Secondary | ICD-10-CM | POA: Diagnosis not present

## 2020-06-19 DIAGNOSIS — M6281 Muscle weakness (generalized): Secondary | ICD-10-CM | POA: Diagnosis not present

## 2020-06-19 DIAGNOSIS — N3943 Post-void dribbling: Secondary | ICD-10-CM | POA: Diagnosis not present

## 2020-06-19 DIAGNOSIS — R3911 Hesitancy of micturition: Secondary | ICD-10-CM | POA: Diagnosis not present

## 2020-06-19 DIAGNOSIS — R208 Other disturbances of skin sensation: Secondary | ICD-10-CM | POA: Diagnosis not present

## 2020-06-20 DIAGNOSIS — M7062 Trochanteric bursitis, left hip: Secondary | ICD-10-CM | POA: Diagnosis not present

## 2020-06-20 DIAGNOSIS — M533 Sacrococcygeal disorders, not elsewhere classified: Secondary | ICD-10-CM | POA: Diagnosis not present

## 2020-06-20 DIAGNOSIS — Z79891 Long term (current) use of opiate analgesic: Secondary | ICD-10-CM | POA: Diagnosis not present

## 2020-06-20 DIAGNOSIS — M461 Sacroiliitis, not elsewhere classified: Secondary | ICD-10-CM | POA: Diagnosis not present

## 2020-06-20 DIAGNOSIS — G8929 Other chronic pain: Secondary | ICD-10-CM | POA: Diagnosis not present

## 2020-06-20 DIAGNOSIS — M7918 Myalgia, other site: Secondary | ICD-10-CM | POA: Diagnosis not present

## 2020-06-25 DIAGNOSIS — F331 Major depressive disorder, recurrent, moderate: Secondary | ICD-10-CM | POA: Diagnosis not present

## 2020-06-25 DIAGNOSIS — F5001 Anorexia nervosa, restricting type: Secondary | ICD-10-CM | POA: Diagnosis not present

## 2020-06-25 DIAGNOSIS — F411 Generalized anxiety disorder: Secondary | ICD-10-CM | POA: Diagnosis not present

## 2020-06-28 DIAGNOSIS — M62838 Other muscle spasm: Secondary | ICD-10-CM | POA: Diagnosis not present

## 2020-06-28 DIAGNOSIS — M6281 Muscle weakness (generalized): Secondary | ICD-10-CM | POA: Diagnosis not present

## 2020-06-28 DIAGNOSIS — R208 Other disturbances of skin sensation: Secondary | ICD-10-CM | POA: Diagnosis not present

## 2020-06-28 DIAGNOSIS — N3943 Post-void dribbling: Secondary | ICD-10-CM | POA: Diagnosis not present

## 2020-06-28 DIAGNOSIS — R3911 Hesitancy of micturition: Secondary | ICD-10-CM | POA: Diagnosis not present

## 2020-06-29 ENCOUNTER — Telehealth (HOSPITAL_COMMUNITY): Payer: Self-pay | Admitting: *Deleted

## 2020-06-29 DIAGNOSIS — F509 Eating disorder, unspecified: Secondary | ICD-10-CM

## 2020-06-29 NOTE — Telephone Encounter (Signed)
Pt called asking for refill of VyVanse. Pt curentlyon 20 mg but states that increasing to 30 mg was discussed in last visit and she would like to tey this. However per your last note it was discussed in that it could increase sleep issues, anxiety, and paranoia. Please review.

## 2020-07-02 DIAGNOSIS — F331 Major depressive disorder, recurrent, moderate: Secondary | ICD-10-CM | POA: Diagnosis not present

## 2020-07-02 DIAGNOSIS — F411 Generalized anxiety disorder: Secondary | ICD-10-CM | POA: Diagnosis not present

## 2020-07-02 DIAGNOSIS — F5001 Anorexia nervosa, restricting type: Secondary | ICD-10-CM | POA: Diagnosis not present

## 2020-07-02 MED ORDER — LISDEXAMFETAMINE DIMESYLATE 30 MG PO CAPS: 30.0000 mg | ORAL_CAPSULE | Freq: Every day | ORAL | 0 refills | Status: DC

## 2020-07-02 NOTE — Telephone Encounter (Signed)
She can try 30 mg Vyvanse however if it caused worsening of anxiety or insomnia then she need to go back to 20 mg.  I call the prescription to [facility].

## 2020-07-03 DIAGNOSIS — M6281 Muscle weakness (generalized): Secondary | ICD-10-CM | POA: Diagnosis not present

## 2020-07-03 DIAGNOSIS — N3943 Post-void dribbling: Secondary | ICD-10-CM | POA: Diagnosis not present

## 2020-07-03 DIAGNOSIS — R3911 Hesitancy of micturition: Secondary | ICD-10-CM | POA: Diagnosis not present

## 2020-07-03 DIAGNOSIS — M62838 Other muscle spasm: Secondary | ICD-10-CM | POA: Diagnosis not present

## 2020-07-03 DIAGNOSIS — R208 Other disturbances of skin sensation: Secondary | ICD-10-CM | POA: Diagnosis not present

## 2020-07-10 DIAGNOSIS — N3943 Post-void dribbling: Secondary | ICD-10-CM | POA: Diagnosis not present

## 2020-07-10 DIAGNOSIS — R208 Other disturbances of skin sensation: Secondary | ICD-10-CM | POA: Diagnosis not present

## 2020-07-10 DIAGNOSIS — R3911 Hesitancy of micturition: Secondary | ICD-10-CM | POA: Diagnosis not present

## 2020-07-10 DIAGNOSIS — M6281 Muscle weakness (generalized): Secondary | ICD-10-CM | POA: Diagnosis not present

## 2020-07-10 DIAGNOSIS — M62838 Other muscle spasm: Secondary | ICD-10-CM | POA: Diagnosis not present

## 2020-07-12 ENCOUNTER — Other Ambulatory Visit (HOSPITAL_COMMUNITY): Payer: Self-pay | Admitting: Psychiatry

## 2020-07-12 DIAGNOSIS — F331 Major depressive disorder, recurrent, moderate: Secondary | ICD-10-CM

## 2020-07-12 DIAGNOSIS — F411 Generalized anxiety disorder: Secondary | ICD-10-CM

## 2020-07-12 DIAGNOSIS — F509 Eating disorder, unspecified: Secondary | ICD-10-CM

## 2020-07-16 DIAGNOSIS — F411 Generalized anxiety disorder: Secondary | ICD-10-CM | POA: Diagnosis not present

## 2020-07-16 DIAGNOSIS — F331 Major depressive disorder, recurrent, moderate: Secondary | ICD-10-CM | POA: Diagnosis not present

## 2020-07-16 DIAGNOSIS — F5001 Anorexia nervosa, restricting type: Secondary | ICD-10-CM | POA: Diagnosis not present

## 2020-07-17 DIAGNOSIS — M6281 Muscle weakness (generalized): Secondary | ICD-10-CM | POA: Diagnosis not present

## 2020-07-17 DIAGNOSIS — R3911 Hesitancy of micturition: Secondary | ICD-10-CM | POA: Diagnosis not present

## 2020-07-17 DIAGNOSIS — M62838 Other muscle spasm: Secondary | ICD-10-CM | POA: Diagnosis not present

## 2020-07-17 DIAGNOSIS — R208 Other disturbances of skin sensation: Secondary | ICD-10-CM | POA: Diagnosis not present

## 2020-07-17 DIAGNOSIS — N3943 Post-void dribbling: Secondary | ICD-10-CM | POA: Diagnosis not present

## 2020-07-18 DIAGNOSIS — M7062 Trochanteric bursitis, left hip: Secondary | ICD-10-CM | POA: Diagnosis not present

## 2020-07-18 DIAGNOSIS — J45909 Unspecified asthma, uncomplicated: Secondary | ICD-10-CM | POA: Insufficient documentation

## 2020-07-24 DIAGNOSIS — M6281 Muscle weakness (generalized): Secondary | ICD-10-CM | POA: Diagnosis not present

## 2020-07-24 DIAGNOSIS — M62838 Other muscle spasm: Secondary | ICD-10-CM | POA: Diagnosis not present

## 2020-07-24 DIAGNOSIS — R3911 Hesitancy of micturition: Secondary | ICD-10-CM | POA: Diagnosis not present

## 2020-07-24 DIAGNOSIS — N3943 Post-void dribbling: Secondary | ICD-10-CM | POA: Diagnosis not present

## 2020-07-24 DIAGNOSIS — R208 Other disturbances of skin sensation: Secondary | ICD-10-CM | POA: Diagnosis not present

## 2020-07-30 ENCOUNTER — Encounter (HOSPITAL_COMMUNITY): Payer: Self-pay | Admitting: Psychiatry

## 2020-07-30 ENCOUNTER — Other Ambulatory Visit: Payer: Self-pay

## 2020-07-30 ENCOUNTER — Telehealth (INDEPENDENT_AMBULATORY_CARE_PROVIDER_SITE_OTHER): Payer: PPO | Admitting: Psychiatry

## 2020-07-30 DIAGNOSIS — F509 Eating disorder, unspecified: Secondary | ICD-10-CM

## 2020-07-30 DIAGNOSIS — F331 Major depressive disorder, recurrent, moderate: Secondary | ICD-10-CM | POA: Diagnosis not present

## 2020-07-30 DIAGNOSIS — F411 Generalized anxiety disorder: Secondary | ICD-10-CM | POA: Diagnosis not present

## 2020-07-30 MED ORDER — BUPROPION HCL 75 MG PO TABS: ORAL_TABLET | ORAL | 1 refills | Status: DC

## 2020-07-30 MED ORDER — CLONAZEPAM 0.5 MG PO TABS: 0.5000 mg | ORAL_TABLET | Freq: Three times a day (TID) | ORAL | 1 refills | Status: DC | PRN

## 2020-07-30 MED ORDER — TEMAZEPAM 30 MG PO CAPS: 30.0000 mg | ORAL_CAPSULE | Freq: Every day | ORAL | 1 refills | Status: DC

## 2020-07-30 MED ORDER — ZIPRASIDONE HCL 80 MG PO CAPS: 80.0000 mg | ORAL_CAPSULE | Freq: Every day | ORAL | 1 refills | Status: DC

## 2020-07-30 MED ORDER — LISDEXAMFETAMINE DIMESYLATE 20 MG PO CAPS: 20.0000 mg | ORAL_CAPSULE | Freq: Every day | ORAL | 0 refills | Status: DC

## 2020-07-30 NOTE — Progress Notes (Signed)
Virtual Visit via Telephone Note  I connected with Katelyn [name] on 07/30/20 at 10:20 AM EDT by telephone and verified that I am speaking with the correct person using two identifiers.  Location: Patient: Home Provider: Home Office   I discussed the limitations, risks, security and privacy concerns of performing an evaluation and management service by telephone and the availability of in person appointments. I also discussed with the patient that there may be a patient responsible charge related to this service. The patient expressed understanding and agreed to proceed.   History of Present Illness: Patient is evaluated by phone session.  She is no longer taking Vyvanse because she felt in the beginning it is not working and then she realized that her muscle relaxant are not as effective while taking Vyvanse.  But she is very concerned because she had gained weight since she stopped the Vyvanse.  She also noticed struggle with motivation, energy, attention, focus.  She is given recently injection in her hip and she feels her pain is manageable.  She like to restart the Vyvanse 20 mg because she felt it was working.  She is sleeping 5 to 6 hours.  She denies any anger, mania, suicidal thoughts.  She endorsed impulsive eating and weight gain in few weeks.  She is in therapy with her nutritionist.  She has residual paranoia but denies any hallucination.  Her daughter is doing better but she is sad because her son did not go back to school.  She lives with her husband who is supportive.  Past Psychiatric History: H/Omultipleinpatient.Last admission Feb 2015. Good response with ECT but scared to continue maintenance ECT. Tried Cymbalta(suicidal thoughts), Lexapro, Abilify, lithium, Wellbutrin, Lamictal, Ritalin, Remeron, Risperdal, Neurontin, Vistaril, BuSpar, Prozac and Valium.   Psychiatric Specialty Exam: Physical Exam  Review of Systems  Weight 187 lb (84.8 kg).There is no height  or weight on file to calculate BMI.  General Appearance: NA  Eye Contact:  NA  Speech:  Slow  Volume:  Decreased  Mood:  Anxious and Dysphoric  Affect:  NA  Thought Process:  Goal Directed  Orientation:  Full (Time, Place, and Person)  Thought Content:  Rumination  Suicidal Thoughts:  No  Homicidal Thoughts:  No  Memory:  Immediate;   Good Recent;   Good Remote;   Good  Judgement:  Intact  Insight:  Present  Psychomotor Activity:  NA  Concentration:  Concentration: Fair and Attention Span: Fair  Recall:  [place] of Knowledge:  Good  Language:  Good  Akathisia:  No  Handed:  Right  AIMS (if indicated):     Assets:  Communication Skills Desire for Improvement Housing Social Support  ADL's:  Intact  Cognition:  WNL  Sleep:   5 hrs      Assessment and Plan: Eating disorder, unspecified type.  Major depressive disorder, recurrent.  Generalized anxiety disorder.  Patient has gained weight which she believes after stopping the Vyvanse and she like to try low-dose 20 mg.  She noticed higher dose sometimes makes her muscle relaxant ineffective but she is also concerned about the weight gain.  Recently she received the injection in her hip and she feeling somewhat manageable.  She is also taking her Klonopin, temazepam, and Geodon and Wellbutrin.  She has no tremors shakes or any EPS.  We will restart Vyvanse 20 mg however explained that if she feels it is not working her impulsive eating attention and focus for causing anxiety, insomnia or  side effects then she should stop taking the stimulants.  Patient agreed with the plan.  We will continue Klonopin 0.5 mg 3 times a day, temazepam 30 mg at bedtime, Wellbutrin 75 mg 2 tablet daily and Geodon 80 mg daily.  Encouraged to keep therapy.  Recommended to call us back if is any question or any concern.  Follow-up in 2 months.  Follow Up Instructions:    I discussed the assessment and treatment plan with the patient. The patient was  provided an opportunity to ask questions and all were answered. The patient agreed with the plan and demonstrated an understanding of the instructions.   The patient was advised to call back or seek an in-person evaluation if the symptoms worsen or if the condition fails to improve as anticipated.  I provided 18 minutes of non-face-to-face time during this encounter.   Kathlee Nations, MD

## 2020-07-31 DIAGNOSIS — R208 Other disturbances of skin sensation: Secondary | ICD-10-CM | POA: Diagnosis not present

## 2020-07-31 DIAGNOSIS — M6281 Muscle weakness (generalized): Secondary | ICD-10-CM | POA: Diagnosis not present

## 2020-07-31 DIAGNOSIS — R3911 Hesitancy of micturition: Secondary | ICD-10-CM | POA: Diagnosis not present

## 2020-07-31 DIAGNOSIS — N3943 Post-void dribbling: Secondary | ICD-10-CM | POA: Diagnosis not present

## 2020-07-31 DIAGNOSIS — M62838 Other muscle spasm: Secondary | ICD-10-CM | POA: Diagnosis not present

## 2020-08-02 DIAGNOSIS — F5001 Anorexia nervosa, restricting type: Secondary | ICD-10-CM | POA: Diagnosis not present

## 2020-08-02 DIAGNOSIS — F411 Generalized anxiety disorder: Secondary | ICD-10-CM | POA: Diagnosis not present

## 2020-08-02 DIAGNOSIS — F331 Major depressive disorder, recurrent, moderate: Secondary | ICD-10-CM | POA: Diagnosis not present

## 2020-08-14 DIAGNOSIS — R3911 Hesitancy of micturition: Secondary | ICD-10-CM | POA: Diagnosis not present

## 2020-08-14 DIAGNOSIS — R208 Other disturbances of skin sensation: Secondary | ICD-10-CM | POA: Diagnosis not present

## 2020-08-14 DIAGNOSIS — N3943 Post-void dribbling: Secondary | ICD-10-CM | POA: Diagnosis not present

## 2020-08-14 DIAGNOSIS — M62838 Other muscle spasm: Secondary | ICD-10-CM | POA: Diagnosis not present

## 2020-08-14 DIAGNOSIS — M6281 Muscle weakness (generalized): Secondary | ICD-10-CM | POA: Diagnosis not present

## 2020-08-15 ENCOUNTER — Other Ambulatory Visit: Payer: Self-pay

## 2020-08-15 ENCOUNTER — Other Ambulatory Visit (HOSPITAL_COMMUNITY): Payer: PPO

## 2020-08-15 ENCOUNTER — Inpatient Hospital Stay (HOSPITAL_COMMUNITY): Payer: PPO | Attending: Hematology

## 2020-08-15 DIAGNOSIS — Z79899 Other long term (current) drug therapy: Secondary | ICD-10-CM | POA: Diagnosis not present

## 2020-08-15 DIAGNOSIS — D7589 Other specified diseases of blood and blood-forming organs: Secondary | ICD-10-CM | POA: Insufficient documentation

## 2020-08-15 DIAGNOSIS — Z9884 Bariatric surgery status: Secondary | ICD-10-CM | POA: Diagnosis not present

## 2020-08-15 DIAGNOSIS — D509 Iron deficiency anemia, unspecified: Secondary | ICD-10-CM

## 2020-08-15 DIAGNOSIS — K760 Fatty (change of) liver, not elsewhere classified: Secondary | ICD-10-CM | POA: Insufficient documentation

## 2020-08-15 DIAGNOSIS — E538 Deficiency of other specified B group vitamins: Secondary | ICD-10-CM | POA: Insufficient documentation

## 2020-08-15 LAB — CBC WITH DIFFERENTIAL/PLATELET
Abs Immature Granulocytes: 0.02 10*3/uL (ref 0.00–0.07)
Basophils Absolute: 0 10*3/uL (ref 0.0–0.1)
Basophils Relative: 0 %
Eosinophils Absolute: 0.1 10*3/uL (ref 0.0–0.5)
Eosinophils Relative: 1 %
HCT: 45.8 % (ref 36.0–46.0)
Hemoglobin: 15 g/dL (ref 12.0–15.0)
Immature Granulocytes: 0 %
Lymphocytes Relative: 21 %
Lymphs Abs: 1.6 10*3/uL (ref 0.7–4.0)
MCH: 33.9 pg (ref 26.0–34.0)
MCHC: 32.8 g/dL (ref 30.0–36.0)
MCV: 103.6 fL — ABNORMAL HIGH (ref 80.0–100.0)
Monocytes Absolute: 0.4 10*3/uL (ref 0.1–1.0)
Monocytes Relative: 5 %
Neutro Abs: 5.5 10*3/uL (ref 1.7–7.7)
Neutrophils Relative %: 73 %
Platelets: 322 10*3/uL (ref 150–400)
RBC: 4.42 MIL/uL (ref 3.87–5.11)
RDW: 12.1 % (ref 11.5–15.5)
WBC: 7.6 10*3/uL (ref 4.0–10.5)
nRBC: 0 % (ref 0.0–0.2)

## 2020-08-15 LAB — COMPREHENSIVE METABOLIC PANEL
ALT: 30 U/L (ref 0–44)
AST: 30 U/L (ref 15–41)
Albumin: 4.9 g/dL (ref 3.5–5.0)
Alkaline Phosphatase: 71 U/L (ref 38–126)
Anion gap: 9 (ref 5–15)
BUN: 17 mg/dL (ref 6–20)
CO2: 28 mmol/L (ref 22–32)
Calcium: 9.3 mg/dL (ref 8.9–10.3)
Chloride: 102 mmol/L (ref 98–111)
Creatinine, Ser: 0.78 mg/dL (ref 0.44–1.00)
GFR, Estimated: >60 mL/min (ref >60)
Glucose, Bld: 98 mg/dL (ref 70–99)
Potassium: 3.9 mmol/L (ref 3.5–5.1)
Sodium: 139 mmol/L (ref 135–145)
Total Bilirubin: 0.6 mg/dL (ref 0.3–1.2)
Total Protein: 7.8 g/dL (ref 6.5–8.1)

## 2020-08-15 LAB — FERRITIN: Ferritin: 127 ng/mL (ref 11–307)

## 2020-08-15 LAB — VITAMIN B12: Vitamin B-12: 3325 pg/mL — ABNORMAL HIGH (ref 180–914)

## 2020-08-15 LAB — IRON AND TIBC
Iron: 58 ug/dL (ref 28–170)
Saturation Ratios: 18 % (ref 10.4–31.8)
TIBC: 317 ug/dL (ref 250–450)
UIBC: 259 ug/dL

## 2020-08-16 DIAGNOSIS — F331 Major depressive disorder, recurrent, moderate: Secondary | ICD-10-CM | POA: Diagnosis not present

## 2020-08-16 DIAGNOSIS — F411 Generalized anxiety disorder: Secondary | ICD-10-CM | POA: Diagnosis not present

## 2020-08-16 DIAGNOSIS — F5001 Anorexia nervosa, restricting type: Secondary | ICD-10-CM | POA: Diagnosis not present

## 2020-08-21 DIAGNOSIS — R208 Other disturbances of skin sensation: Secondary | ICD-10-CM | POA: Diagnosis not present

## 2020-08-21 DIAGNOSIS — N3943 Post-void dribbling: Secondary | ICD-10-CM | POA: Diagnosis not present

## 2020-08-21 DIAGNOSIS — M6281 Muscle weakness (generalized): Secondary | ICD-10-CM | POA: Diagnosis not present

## 2020-08-21 DIAGNOSIS — M62838 Other muscle spasm: Secondary | ICD-10-CM | POA: Diagnosis not present

## 2020-08-21 DIAGNOSIS — R3911 Hesitancy of micturition: Secondary | ICD-10-CM | POA: Diagnosis not present

## 2020-08-22 ENCOUNTER — Ambulatory Visit (HOSPITAL_COMMUNITY): Payer: PPO | Admitting: Hematology

## 2020-08-22 ENCOUNTER — Other Ambulatory Visit: Payer: Self-pay

## 2020-08-22 ENCOUNTER — Inpatient Hospital Stay (HOSPITAL_BASED_OUTPATIENT_CLINIC_OR_DEPARTMENT_OTHER): Payer: PPO | Admitting: Hematology

## 2020-08-22 DIAGNOSIS — D7589 Other specified diseases of blood and blood-forming organs: Secondary | ICD-10-CM

## 2020-08-22 DIAGNOSIS — D509 Iron deficiency anemia, unspecified: Secondary | ICD-10-CM

## 2020-08-22 NOTE — Progress Notes (Signed)
Virtual Visit via Telephone Note  I connected with Katelyn [name] on 08/22/20 at  4:15 PM EDT by telephone and verified that I am speaking with the correct person using two identifiers.  Location: Patient: At home Provider: In the office   I discussed the limitations, risks, security and privacy concerns of performing an evaluation and management service by telephone and the availability of in person appointments. I also discussed with the patient that there may be a patient responsible charge related to this service. The patient expressed understanding and agreed to proceed.   History of Present Illness: She is followed in our clinic for iron and B12 deficiency secondary to malabsorption resulting from gastric bypass surgery and chronic PPI therapy.   Observations/Objective: She reports that she had a fever of 102 lasted for a day on the second day of her second Injectafer infusion.  She also felt bad with the fever.  Tylenol helped.  She also had headaches from the Solu-Medrol given prior to iron infusion.  She does not have any fevers at this time.  Assessment and Plan:  1.  Iron deficiency state: - Injectafer on 01/28/2019 and 05/04/2020. - She had fever lasting for a day up to 102 after each Injectafer infusion.  We have given premeds with Benadryl, Tylenol and Solu-Medrol.  Even then the fever recurred. - She had previous reaction to [facility] and multiple medication allergies. - We reviewed her labs.  Her ferritin is 127.  Hemoglobin is 15.  She does not require any iron infusion at this time. - We will plan to recheck it in 3 months.  We have to use a different parenteral iron preparation like Venofer with premedication next time.  2.  Macrocytosis: - She has persistent macrocytosis for the past few times. - She had history of fatty infiltration of liver prior to her gastric bypass surgery.  No subsequent imaging after surgery in 2015. - B12 level was elevated at 3325.   Recent LFTs were normal. - Will do further work-up for macrocytosis prior to next visit.  This includes folic acid, TSH, LDH, copper, SPEP. - We will also consider imaging of her liver at some point.  3.  Elevated B12 levels: - She had a persistently elevated B12 level since at least June of last year. - She is not on any B12 supplements at this time. - I have explained to her that this could be acute phase reactant.  We will also check inflammatory markers including ESR and CRP at next visit.   Follow Up Instructions: RTC 3 months for follow-up.   I discussed the assessment and treatment plan with the patient. The patient was provided an opportunity to ask questions and all were answered. The patient agreed with the plan and demonstrated an understanding of the instructions.   The patient was advised to call back or seek an in-person evaluation if the symptoms worsen or if the condition fails to improve as anticipated.  I provided 21 minutes of non-face-to-face time during this encounter.   Derek Jack, MD

## 2020-08-24 ENCOUNTER — Encounter: Payer: Self-pay | Admitting: Physician Assistant

## 2020-08-24 ENCOUNTER — Telehealth: Payer: PPO | Admitting: Physician Assistant

## 2020-08-24 DIAGNOSIS — R1013 Epigastric pain: Secondary | ICD-10-CM

## 2020-08-24 NOTE — Patient Instructions (Signed)
Peptic Ulcer  A peptic ulcer is a sore in the lining of the stomach (gastric ulcer) or the first part of the small intestine (duodenal ulcer). The ulcer causes a gradual wearing away (erosion) of the deeper tissue. What are the causes? Normally, the lining of the stomach and the small intestine protects them from the acid that digests food. The protective lining can be damaged by:  An infection caused by a type of bacteria called Helicobacter pylori or H. pylori.  Regular use of NSAIDs, such as ibuprofen or aspirin.  Rare tumors in the stomach, small intestine, or pancreas (Zollinger-Ellison syndrome). What increases the risk? The following factors may make you more likely to develop this condition:  Smoking.  Having a family history of ulcer disease.  Drinking alcohol.  Having been hospitalized in an intensive care unit (ICU). What are the signs or symptoms? Symptoms of this condition include:  Persistent burning pain in the area between the chest and the belly button. The pain may be worse on an empty stomach and at night.  Heartburn.  Nausea and vomiting.  Bloating. If the ulcer results in bleeding, it can cause:  Black, tarry stools.  Vomiting of bright red blood.  Vomiting of material that looks like coffee grounds. How is this diagnosed? This condition may be diagnosed based on:  Your medical history and a physical exam.  Various tests or procedures, such as: ? Blood tests, stool tests, or breath tests to check for the H. pylori bacteria. ? An X-ray exam (upper gastrointestinal series) of the esophagus, stomach, and small intestine. ? Upper endoscopy. The health care provider examines the esophagus, stomach, and small intestine using a small flexible tube that has a video camera at the end. ? Biopsy. A tissue sample is removed to be examined under a microscope. How is this treated? Treatment for this condition may include:  Eliminating the cause of the ulcer,  such as smoking or use of NSAIDs, and limiting alcohol and caffeine intake.  Medicines to reduce the amount of acid in your digestive tract.  Antibiotic medicines, if the ulcer is caused by an H. pylori infection.  An upper endoscopy may be used to treat a bleeding ulcer.  Surgery. This may be needed if the bleeding is severe or if the ulcer created a hole somewhere in the digestive system. Follow these instructions at home:  Do not drink alcohol if your health care provider tells you not to drink.  Do not use any products that contain nicotine or tobacco, such as cigarettes, e-cigarettes, and chewing tobacco. If you need help quitting, ask your health care provider.  Take over-the-counter and prescription medicines only as told by your health care provider. ? Do not use over-the-counter medicines in place of prescription medicines unless your health care provider approves. ? Do not take aspirin, ibuprofen, or other NSAIDs unless your health care provider told you to do so.  Take over-the-counter and prescription medicines only as told by your health care provider.  Keep all follow-up visits as told by your health care provider. This is important. Contact a health care provider if:  Your symptoms do not improve within 7 days of starting treatment.  You have ongoing indigestion or heartburn. Get help right away if:  You have sudden, sharp, or persistent pain in your abdomen.  You have bloody or dark black, tarry stools.  You vomit blood or material that looks like coffee grounds.  You become light-headed or you feel faint.    You become weak.  You become sweaty or clammy. Summary  A peptic ulcer is a sore in the lining of the stomach (gastric ulcer) or the first part of the small intestine (duodenal ulcer). The ulcer causes a gradual wearing away (erosion) of the deeper tissue.  Do not use any products that contain nicotine or tobacco, such as cigarettes, e-cigarettes, and  chewing tobacco. If you need help quitting, ask your health care provider.  Take over-the-counter and prescription medicines only as told by your health care provider. Do not use over-the-counter medicines in place of prescription medicines unless your health care provider approves.  Contact your health care provider if you have ongoing indigestion or heartburn.  Keep all follow-up visits as told by your health care provider. This is important. This information is not intended to replace advice given to you by your health care provider. Make sure you discuss any questions you have with your health care provider. Document Revised: 10/20/2017 Document Reviewed: 10/20/2017 Elsevier Patient Education  2021 [place]. Peptic Ulcer Eating Plan Peptic ulcers are sores that form on the lining of the stomach, esophagus, or the part of the small intestine that is attached to the stomach (duodenum). These sores are also called stomach ulcers. When ulcers develop, they can cause a burning feeling in the stomach as well as bloating, nausea, vomiting, and poor appetite. If you have a history of peptic ulcers, it is important to keep track of what foods and drinks cause symptoms. What are tips for following this plan?  Eat a healthy, well-balanced diet. This includes: ? Fresh fruits and vegetables. Eat a variety of colors of fruits and vegetables. ? Whole grains. Try to make sure at least half of the grains you eat each day are whole grains. ? Low-fat dairy. ? Lean meat, fish, poultry, eggs, beans, and nuts. ? Healthy fats, such as olive oil, grapeseed oil, or canola oil. Try to eat less than 8 teaspoons of fats and oils each day.  Avoid foods that cause irritation or pain. These may be different for different people. Keep a food diary to identify foods that cause symptoms.  Avoid processed foods that have added salt and sugar.  Avoid drinking alcohol.  Avoid drinks with caffeine, such as cola,  black tea, energy drinks, and coffee.   Recommended foods Grains  Whole grains. Vegetables  All fresh or frozen vegetables. Low-sodium canned vegetables. Fruits  All fresh, frozen, or dried fruit. Fruit canned in juice. Meats and other protein foods  Lean cuts of meat. Skinless poultry. Fresh or canned fish. Eggs. Tofu. Nuts and nut butter. Dried beans. Low-sodium canned beans. Dairy  Low-fat or nonfat (skim) milk. Nonfat or low-fat yogurt. Nonfat or low-fat cheese. Beverages  Water. Soy or nut milks. Caffeine-free soft drinks. Herbal tea. Fats and oils  Olive oil. Canola oil. Grapeseed oil. Sunflower oil. Seasoning and other foods  Low-fat salad dressing. Ketchup. Low-fat mayonnaise. All spices except pepper. Low-sodium seasoning mixes. Foods to avoid Meats and other protein foods  Fatty meats. Fried meats. Any meat that causes symptoms. Dairy  Whole milk. Ice cream. Cream. Chocolate milk. Beverages  Alcohol. Coffee. Cola and energy drinks. Black or green tea. Cocoa. Fats and oils  Butter. Lard. Ghee. Seasoning and other foods  Pepper. Hot sauce. Any seasonings or condiments that cause symptoms. Summary  Peptic ulcers can cause burning in the stomach as well as bloating, nausea, vomiting, and poor appetite. You may be able to limit symptoms by avoiding  foods that make you feel worse.  Work with your dietitian or health care provider to identify foods that cause symptoms. This may include caffeinated drinks, alcohol, or pepper. This information is not intended to replace advice given to you by your health care provider. Make sure you discuss any questions you have with your health care provider. Document Revised: 03/27/2017 Document Reviewed: 05/26/2016 Elsevier Patient Education  2021 [place] for Gastroesophageal Reflux Disease, Adult When you have gastroesophageal reflux disease (GERD), the foods you eat and your eating habits are very  important. Choosing the right foods can help ease your discomfort. Think about working with a food expert (dietitian) to help you make good choices. What are tips for following this plan? Reading food labels  Look for foods that are low in saturated fat. Foods that may help with your symptoms include: ? Foods that have less than 5% of daily value (DV) of fat. ? Foods that have 0 grams of trans fat. Cooking  Do not fry your food.  Cook your food by baking, steaming, grilling, or broiling. These are all methods that do not need a lot of fat for cooking.  To add flavor, try to use herbs that are low in spice and acidity. Meal planning  Choose healthy foods that are low in fat, such as: ? Fruits and vegetables. ? Whole grains. ? Low-fat dairy products. ? Lean meats, fish, and poultry.  Eat small meals often instead of eating 3 large meals each day. Eat your meals slowly in a place where you are relaxed. Avoid bending over or lying down until 2-3 hours after eating.  Limit high-fat foods such as fatty meats or fried foods.  Limit your intake of fatty foods, such as oils, butter, and shortening.  Avoid the following as told by your doctor: ? Foods that cause symptoms. These may be different for different people. Keep a food diary to keep track of foods that cause symptoms. ? Alcohol. ? Drinking a lot of liquid with meals. ? Eating meals during the 2-3 hours before bed.   Lifestyle  Stay at a healthy weight. Ask your doctor what weight is healthy for you. If you need to lose weight, work with your doctor to do so safely.  Exercise for at least 30 minutes on 5 or more days each week, or as told by your doctor.  Wear loose-fitting clothes.  Do not smoke or use any products that contain nicotine or tobacco. If you need help quitting, ask your doctor.  Sleep with the head of your bed higher than your feet. Use a wedge under the mattress or blocks under the bed frame to raise the head  of the bed.  Chew sugar-free gum after meals. What foods should eat? Eat a healthy, well-balanced diet of fruits, vegetables, whole grains, low-fat dairy products, lean meats, fish, and poultry. Each person is different. Foods that may cause symptoms in one person may not cause any symptoms in another person. Work with your doctor to find foods that are safe for you. The items listed above may not be a complete list of what you can eat and drink. Contact a food expert for more options.   What foods should I avoid? Limiting some of these foods may help in managing the symptoms of GERD. Everyone is different. Talk with a food expert or your doctor to help you find the exact foods to avoid, if any. Fruits Any fruits prepared with added fat. Any  fruits that cause symptoms. For some people, this may include citrus fruits, such as oranges, grapefruit, pineapple, and lemons. Vegetables Deep-fried vegetables. [place] fries. Any vegetables prepared with added fat. Any vegetables that cause symptoms. For some people, this may include tomatoes and tomato products, chili peppers, onions and garlic, and horseradish. Grains Pastries or quick breads with added fat. Meats and other proteins High-fat meats, such as fatty beef or pork, hot dogs, ribs, ham, sausage, salami, and bacon. Fried meat or protein, including fried fish and fried chicken. Nuts and nut butters, in large amounts. Dairy Whole milk and chocolate milk. Sour cream. Cream. Ice cream. Cream cheese. Milkshakes. Fats and oils Butter. Margarine. Shortening. Ghee. Beverages Coffee and tea, with or without caffeine. Carbonated beverages. Sodas. Energy drinks. Fruit juice made with acidic fruits, such as orange or grapefruit. Tomato juice. Alcoholic drinks. Sweets and desserts Chocolate and cocoa. Donuts. Seasonings and condiments Pepper. Peppermint and spearmint. Added salt. Any condiments, herbs, or seasonings that cause symptoms. For some people,  this may include curry, hot sauce, or vinegar-based salad dressings. The items listed above may not be a complete list of what you should not eat and drink. Contact a food expert for more options. Questions to ask your doctor Diet and lifestyle changes are often the first steps that are taken to manage symptoms of GERD. If diet and lifestyle changes do not help, talk with your doctor about taking medicines. Where to find more information  International Foundation for Gastrointestinal Disorders: aboutgerd.org Summary  When you have GERD, food and lifestyle choices are very important in easing your symptoms.  Eat small meals often instead of 3 large meals a day. Eat your meals slowly and in a place where you are relaxed.  Avoid bending over or lying down until 2-3 hours after eating.  Limit high-fat foods such as fatty meats or fried foods. This information is not intended to replace advice given to you by your health care provider. Make sure you discuss any questions you have with your health care provider. Document Revised: 10/24/2019 Document Reviewed: 10/24/2019 Elsevier Patient Education  [place].

## 2020-08-24 NOTE — Progress Notes (Signed)
Katelyn [name], Katelyn [name] are scheduled for a virtual visit with your provider today.    Just as we do with appointments in the office, we must obtain your consent to participate.  Your consent will be active for this visit and any virtual visit you may have with one of our providers in the next 365 days.    If you have a MyChart account, I can also send a copy of this consent to you electronically.  All virtual visits are billed to your insurance company just like a traditional visit in the office.  As this is a virtual visit, video technology does not allow for your provider to perform a traditional examination.  This may limit your provider's ability to fully assess your condition.  If your provider identifies any concerns that need to be evaluated in person or the need to arrange testing such as labs, EKG, etc, we will make arrangements to do so.    Although advances in technology are sophisticated, we cannot ensure that it will always work on either your end or our end.  If the connection with a video visit is poor, we may have to switch to a telephone visit.  With either a video or telephone visit, we are not always able to ensure that we have a secure connection.   I need to obtain your verbal consent now.   Are you willing to proceed with your visit today?   Katelyn [name] has provided verbal consent on 08/24/2020 for a virtual visit (video or telephone).   Mar Daring, PA-C 08/24/2020  10:22 AM      MyChart Video Visit    Virtual Visit via Video Note   This visit type was conducted due to national recommendations for restrictions regarding the COVID-19 Pandemic (e.g. social distancing) in an effort to limit this patient's exposure and mitigate transmission in our community. This patient is at least at moderate risk for complications without adequate follow up. This format is felt to be most appropriate for this patient at this time. Physical exam was limited by quality of the  video and audio technology used for the visit.   Patient location: Home Provider location: Home office in [place]  I discussed the limitations of evaluation and management by telemedicine and the availability of in person appointments. The patient expressed understanding and agreed to proceed.  Patient: Katelyn [name]   DOB: 12/25/1975   45 y.o. Female  MRN: [id number] Visit Date: 08/24/2020  Today's healthcare provider: Mar Daring, PA-C   No chief complaint on file.  Subjective    Gastroesophageal Reflux She complains of abdominal pain, belching, early satiety, globus sensation, heartburn and nausea. She reports no chest pain, no dysphagia or no sore throat. This is a new problem. The current episode started 1 to 4 weeks ago (2 weeks ago started having heart burn symptoms, but has progressed to epigastric burning/pain and nausea ). The problem occurs constantly. The problem has been gradually worsening. The heartburn is located in the substernum. The heartburn is of severe intensity. The heartburn limits her activity. Associated symptoms include anemia and fatigue. Risk factors: s/p bariatric surgery. She has tried an antacid, head elevation and a histamine-2 antagonist for the symptoms. The treatment provided no relief.     Patient Active Problem List   Diagnosis Date Noted  . Vitamin B 12 deficiency 10/16/2019  . Elevated LFTs 10/16/2019  . Ulcer of toe of left foot ([place]) 05/10/2019  . Asthma, exogenous,  unspecified asthma severity, uncomplicated [id number]  . Multiple environmental allergies 08/29/2018  . B12 deficiency anemia 06/21/2018  . Venous insufficiency 04/23/2018  . Onychomycosis of toenail 04/17/2018  . Chronic myofascial pain 03/18/2018  . Small fiber neuropathy 03/18/2018  . Idiopathic small fiber sensory neuropathy 02/23/2018  . Varicose veins of both lower extremities 01/13/2018  . Generalized pain 10/16/2017  . Irregular menses 10/16/2017   . Cervical spondylosis without myelopathy 08/28/2017  . Migraine with aura and without status migrainosus, not intractable 07/22/2017  . Lumbar degenerative disc disease 07/20/2017  . Spondylosis of lumbar spine 07/20/2017  . Coccydynia 03/12/2017  . Bariatric surgery status 05/02/2016  . Major depressive disorder, recurrent episode, moderate ([place]) 02/18/2016  . Tachy-brady syndrome ([place]) 12/19/2015  . Candidal vulvovaginitis 10/15/2015  . Iron deficiency anemia 06/10/2015  . Sleep disorder 06/01/2013  . Generalized anxiety disorder 05/30/2013  . Chronic headaches 07/01/2012  . Depression, major, recurrent, severe with psychosis ([place]) 06/17/2012  . Fatigue 03/05/2012  . Family hx colonic polyps 03/03/2012  . Eating disorder 12/18/2011  . POLYCYSTIC OVARIAN DISEASE 09/01/2008  . Intermittent left-sided chest pain 08/02/2008   Past Medical History:  Diagnosis Date  . Anemia   . Anorexia   . Anxiety   . Asthma    controlled with meds  . Benign juvenile melanoma   . Chronic headaches   . Colon polyps    found on colonoscopy 04/26/2012  . Complication of anesthesia    itching after epidural for c section  . Constipation   . Depression   . Depression    several suicide attempts, hospitaluzed in 2012 for this; hx pf ECT treatments ; pt sees Dr. Adele Schilder pyschiatrist  and doing well on medication  . Headache   . Heart murmur    as a child - no one mentions hearing murmur anymore  . Heartburn    no meds  . History of pneumonia    x 3  years ago - no recent problems  . Hx of blood clots   . Neuromuscular disorder ([place])    small fiber neuropathy  . Obesity   . Pneumonia   . Polycystic ovary    takes metformin to treat  . Skin lesion    Excisional biopsy of moles - none cancerous  . Sleep apnea    yrs ago - diagnosed mild sleep apnea - did not have to use cpap       Medications: Outpatient Medications Prior to Visit  Medication Sig  . albuterol (VENTOLIN HFA) 108 (90  Base) MCG/ACT inhaler Inhale 2 puffs into the lungs every 6 (six) hours as needed for wheezing or shortness of breath.  . Ascorbic Acid (VITAMIN C) 100 MG tablet Take 100 mg by mouth daily.  . budesonide-formoterol (SYMBICORT) 80-4.5 MCG/ACT inhaler Inhale 2 puffs into the lungs 2 (two) times daily.  Marland Kitchen buPROPion (WELLBUTRIN) 75 MG tablet Take 2 tab in AM  . calcium carbonate (OS-CAL - DOSED IN MG OF ELEMENTAL CALCIUM) 1250 (500 Ca) MG tablet Take 1 tablet by mouth.  . cholecalciferol (VITAMIN D3) 25 MCG (1000 UT) tablet Take 1,000 Units by mouth daily.  . clonazePAM (KLONOPIN) 0.5 MG tablet Take 1 tablet (0.5 mg total) by mouth 3 (three) times daily as needed for anxiety.  . Cyanocobalamin (B-12) 1000 MCG SUBL Place 1,000 mcg under the tongue daily.  Marland Kitchen gabapentin (NEURONTIN) 600 MG tablet Take 600 mg by mouth 3 (three) times daily.  Marland Kitchen HYDROcodone-acetaminophen (NORCO) 7.5-325 MG tablet  Take 1 tablet by mouth every 6 (six) hours as needed for moderate pain.  . Lactobacillus Rhamnosus, GG, (CULTURELLE PO) Take by mouth.  . lisdexamfetamine (VYVANSE) 20 MG capsule Take 1 capsule (20 mg total) by mouth daily.  . magnesium oxide (MAG-OX) 400 MG tablet Take 400 mg by mouth daily.  . metoprolol tartrate (LOPRESSOR) 25 MG tablet Take 0.5 tablets (12.5 mg total) by mouth 2 (two) times daily as needed.  . miconazole (MONISTAT 7) 2 % vaginal cream Place 1 Applicatorful vaginally at bedtime. Apply once daily to affected area for 1 week , then , as needed  . Multiple Vitamin (MULTIVITAMIN WITH MINERALS) TABS tablet Take 3 tablets by mouth 2 (two) times daily.   . naloxone (NARCAN) nasal spray 4 mg/0.1 mL Place into the nose as needed.   . ondansetron (ZOFRAN) 4 MG tablet Take 1 tablet (4 mg total) by mouth every 8 (eight) hours as needed for nausea or vomiting. Take one tablet by mouth 4 times weekly, as needed, for nausea  . PREMARIN vaginal cream 2 times a week  . temazepam (RESTORIL) 30 MG capsule Take 1  capsule (30 mg total) by mouth at bedtime.  Marland Kitchen tiZANidine (ZANAFLEX) 4 MG tablet Take 4 mg by mouth at bedtime.  . Zinc 100 MG TABS Take 1 tablet by mouth daily.  . ziprasidone (GEODON) 80 MG capsule Take 1 capsule (80 mg total) by mouth at bedtime.   No facility-administered medications prior to visit.    Review of Systems  Constitutional: Positive for fatigue.  HENT: Negative for sore throat.   Respiratory: Negative.   Cardiovascular: Negative for chest pain.  Gastrointestinal: Positive for abdominal pain, constipation, heartburn and nausea. Negative for dysphagia.    Last CBC Lab Results  Component Value Date   WBC 7.6 08/15/2020   HGB 15.0 08/15/2020   HCT 45.8 08/15/2020   MCV 103.6 (H) 08/15/2020   MCH 33.9 08/15/2020   RDW 12.1 08/15/2020   PLT 322 41/96/2229   Last metabolic panel Lab Results  Component Value Date   GLUCOSE 98 08/15/2020   NA 139 08/15/2020   K 3.9 08/15/2020   CL 102 08/15/2020   CO2 28 08/15/2020   BUN 17 08/15/2020   CREATININE 0.78 08/15/2020   GFRNONAA >60 08/15/2020   GFRAA >60 12/30/2019   CALCIUM 9.3 08/15/2020   PHOS 3.6 12/27/2014   PROT 7.8 08/15/2020   ALBUMIN 4.9 08/15/2020   BILITOT 0.6 08/15/2020   ALKPHOS 71 08/15/2020   AST 30 08/15/2020   ALT 30 08/15/2020   ANIONGAP 9 08/15/2020      Objective    There were no vitals taken for this visit. BP Readings from Last 3 Encounters:  05/04/20 118/69  03/20/20 114/68  02/22/20 124/80   Wt Readings from Last 3 Encounters:  10/18/19 174 lb (78.9 kg)  10/10/19 174 lb (78.9 kg)  10/04/19 174 lb 12.8 oz (79.3 kg)      Physical Exam Vitals reviewed.  Constitutional:      General: She is not in acute distress.    Appearance: Normal appearance. She is well-developed. She is not ill-appearing.  HENT:     Head: Normocephalic and atraumatic.  Pulmonary:     Effort: Pulmonary effort is normal. No respiratory distress.  Musculoskeletal:     Cervical back: Normal range of  motion and neck supple.  Neurological:     Mental Status: She is alert.  Psychiatric:  Mood and Affect: Mood normal.        Behavior: Behavior normal.        Thought Content: Thought content normal.        Judgment: Judgment normal.       Assessment & Plan     1. Epigastric pain - Suspect PUD but DDx also includes GERD, gallbladder disease, pancreatitis - Discussed adding Protonix (patient already has on hand); May not be beneficial due to h/o gastric bypass - Continue Mylanta/Maalox, Tums prn - Advised to start with bland diet and increase as tolerated - Keep up hydration - Patient is high risk for other complications due to h/o anemia and gastric bypass. - Advised to seek in person evaluation as soon as possible    No follow-ups on file.     I discussed the assessment and treatment plan with the patient. The patient was provided an opportunity to ask questions and all were answered. The patient agreed with the plan and demonstrated an understanding of the instructions.   The patient was advised to call back or seek an in-person evaluation if the symptoms worsen or if the condition fails to improve as anticipated.  I provided 14 minutes of face-to-face time during this encounter via MyChart Video enabled encounter.   [place] [phone] (phone) [phone] (fax)  [place]

## 2020-08-27 ENCOUNTER — Telehealth (HOSPITAL_COMMUNITY): Payer: Self-pay | Admitting: *Deleted

## 2020-08-27 DIAGNOSIS — F509 Eating disorder, unspecified: Secondary | ICD-10-CM

## 2020-08-27 DIAGNOSIS — F5001 Anorexia nervosa, restricting type: Secondary | ICD-10-CM | POA: Diagnosis not present

## 2020-08-27 DIAGNOSIS — F331 Major depressive disorder, recurrent, moderate: Secondary | ICD-10-CM | POA: Diagnosis not present

## 2020-08-27 DIAGNOSIS — F411 Generalized anxiety disorder: Secondary | ICD-10-CM | POA: Diagnosis not present

## 2020-08-27 NOTE — Telephone Encounter (Signed)
Pt called requesting refill of the Vyvanse. Pt has an appointment on 10/01/20. Thanks.

## 2020-08-28 MED ORDER — LISDEXAMFETAMINE DIMESYLATE 20 MG PO CAPS: 20.0000 mg | ORAL_CAPSULE | Freq: Every day | ORAL | 0 refills | Status: DC

## 2020-08-28 NOTE — Telephone Encounter (Signed)
Done

## 2020-08-29 ENCOUNTER — Telehealth (HOSPITAL_COMMUNITY): Payer: PPO | Admitting: Psychiatry

## 2020-08-29 DIAGNOSIS — M7918 Myalgia, other site: Secondary | ICD-10-CM | POA: Diagnosis not present

## 2020-08-29 DIAGNOSIS — M533 Sacrococcygeal disorders, not elsewhere classified: Secondary | ICD-10-CM | POA: Diagnosis not present

## 2020-08-29 DIAGNOSIS — M461 Sacroiliitis, not elsewhere classified: Secondary | ICD-10-CM | POA: Diagnosis not present

## 2020-08-29 DIAGNOSIS — G8929 Other chronic pain: Secondary | ICD-10-CM | POA: Diagnosis not present

## 2020-08-29 DIAGNOSIS — G894 Chronic pain syndrome: Secondary | ICD-10-CM | POA: Diagnosis not present

## 2020-08-29 DIAGNOSIS — M7062 Trochanteric bursitis, left hip: Secondary | ICD-10-CM | POA: Diagnosis not present

## 2020-09-04 DIAGNOSIS — R3911 Hesitancy of micturition: Secondary | ICD-10-CM | POA: Diagnosis not present

## 2020-09-04 DIAGNOSIS — R208 Other disturbances of skin sensation: Secondary | ICD-10-CM | POA: Diagnosis not present

## 2020-09-04 DIAGNOSIS — N3943 Post-void dribbling: Secondary | ICD-10-CM | POA: Diagnosis not present

## 2020-09-04 DIAGNOSIS — M62838 Other muscle spasm: Secondary | ICD-10-CM | POA: Diagnosis not present

## 2020-09-04 DIAGNOSIS — M6281 Muscle weakness (generalized): Secondary | ICD-10-CM | POA: Diagnosis not present

## 2020-09-06 DIAGNOSIS — R1084 Generalized abdominal pain: Secondary | ICD-10-CM | POA: Diagnosis not present

## 2020-09-10 DIAGNOSIS — F411 Generalized anxiety disorder: Secondary | ICD-10-CM | POA: Diagnosis not present

## 2020-09-10 DIAGNOSIS — F331 Major depressive disorder, recurrent, moderate: Secondary | ICD-10-CM | POA: Diagnosis not present

## 2020-09-10 DIAGNOSIS — F5001 Anorexia nervosa, restricting type: Secondary | ICD-10-CM | POA: Diagnosis not present

## 2020-09-13 DIAGNOSIS — Z9884 Bariatric surgery status: Secondary | ICD-10-CM | POA: Diagnosis not present

## 2020-09-14 ENCOUNTER — Other Ambulatory Visit (HOSPITAL_COMMUNITY): Payer: Self-pay | Admitting: Psychiatry

## 2020-09-14 DIAGNOSIS — F509 Eating disorder, unspecified: Secondary | ICD-10-CM

## 2020-09-14 DIAGNOSIS — F411 Generalized anxiety disorder: Secondary | ICD-10-CM

## 2020-09-14 DIAGNOSIS — F331 Major depressive disorder, recurrent, moderate: Secondary | ICD-10-CM

## 2020-09-18 DIAGNOSIS — M6281 Muscle weakness (generalized): Secondary | ICD-10-CM | POA: Diagnosis not present

## 2020-09-18 DIAGNOSIS — M62838 Other muscle spasm: Secondary | ICD-10-CM | POA: Diagnosis not present

## 2020-09-18 DIAGNOSIS — N3943 Post-void dribbling: Secondary | ICD-10-CM | POA: Diagnosis not present

## 2020-09-18 DIAGNOSIS — R3911 Hesitancy of micturition: Secondary | ICD-10-CM | POA: Diagnosis not present

## 2020-09-18 DIAGNOSIS — R208 Other disturbances of skin sensation: Secondary | ICD-10-CM | POA: Diagnosis not present

## 2020-09-25 DIAGNOSIS — R3911 Hesitancy of micturition: Secondary | ICD-10-CM | POA: Diagnosis not present

## 2020-09-25 DIAGNOSIS — N3943 Post-void dribbling: Secondary | ICD-10-CM | POA: Diagnosis not present

## 2020-09-25 DIAGNOSIS — R208 Other disturbances of skin sensation: Secondary | ICD-10-CM | POA: Diagnosis not present

## 2020-09-25 DIAGNOSIS — M6281 Muscle weakness (generalized): Secondary | ICD-10-CM | POA: Diagnosis not present

## 2020-09-25 DIAGNOSIS — M62838 Other muscle spasm: Secondary | ICD-10-CM | POA: Diagnosis not present

## 2020-09-26 ENCOUNTER — Telehealth (HOSPITAL_COMMUNITY): Payer: Self-pay | Admitting: *Deleted

## 2020-09-26 DIAGNOSIS — F509 Eating disorder, unspecified: Secondary | ICD-10-CM

## 2020-09-26 NOTE — Telephone Encounter (Signed)
Pt called requesting refill of VyVanse last written 08/28/20. Pt has an appointment on 10/01/20 so will be short. Thanks.

## 2020-09-26 NOTE — Telephone Encounter (Signed)
Pt actually wants to know if VyVanse can be increased from 20mg  to 30 mg, as current dose not lasting long enough. Thanks!

## 2020-09-27 DIAGNOSIS — F331 Major depressive disorder, recurrent, moderate: Secondary | ICD-10-CM | POA: Diagnosis not present

## 2020-09-27 DIAGNOSIS — F5001 Anorexia nervosa, restricting type: Secondary | ICD-10-CM | POA: Diagnosis not present

## 2020-09-27 DIAGNOSIS — F411 Generalized anxiety disorder: Secondary | ICD-10-CM | POA: Diagnosis not present

## 2020-09-27 MED ORDER — LISDEXAMFETAMINE DIMESYLATE 20 MG PO CAPS: 20.0000 mg | ORAL_CAPSULE | Freq: Every day | ORAL | 0 refills | Status: DC

## 2020-09-27 NOTE — Telephone Encounter (Signed)
She should stay on same dose until her next appointment. We had discussed this before. I will send a new prescription today.

## 2020-10-01 ENCOUNTER — Encounter (HOSPITAL_COMMUNITY): Payer: Self-pay | Admitting: Psychiatry

## 2020-10-01 ENCOUNTER — Other Ambulatory Visit: Payer: Self-pay

## 2020-10-01 ENCOUNTER — Telehealth (INDEPENDENT_AMBULATORY_CARE_PROVIDER_SITE_OTHER): Payer: PPO | Admitting: Psychiatry

## 2020-10-01 DIAGNOSIS — F509 Eating disorder, unspecified: Secondary | ICD-10-CM

## 2020-10-01 DIAGNOSIS — F331 Major depressive disorder, recurrent, moderate: Secondary | ICD-10-CM | POA: Diagnosis not present

## 2020-10-01 DIAGNOSIS — F411 Generalized anxiety disorder: Secondary | ICD-10-CM | POA: Diagnosis not present

## 2020-10-01 MED ORDER — ZIPRASIDONE HCL 80 MG PO CAPS: 80.0000 mg | ORAL_CAPSULE | Freq: Every day | ORAL | 0 refills | Status: DC

## 2020-10-01 MED ORDER — CLONAZEPAM 0.5 MG PO TABS: 0.5000 mg | ORAL_TABLET | Freq: Three times a day (TID) | ORAL | 0 refills | Status: DC | PRN

## 2020-10-01 MED ORDER — BUPROPION HCL 100 MG PO TABS: 100.0000 mg | ORAL_TABLET | Freq: Every day | ORAL | 0 refills | Status: DC

## 2020-10-01 MED ORDER — TEMAZEPAM 30 MG PO CAPS: 30.0000 mg | ORAL_CAPSULE | Freq: Every day | ORAL | 0 refills | Status: DC
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
## 2020-10-02 DIAGNOSIS — R208 Other disturbances of skin sensation: Secondary | ICD-10-CM | POA: Diagnosis not present

## 2020-10-02 DIAGNOSIS — M62838 Other muscle spasm: Secondary | ICD-10-CM | POA: Diagnosis not present

## 2020-10-02 DIAGNOSIS — N3943 Post-void dribbling: Secondary | ICD-10-CM | POA: Diagnosis not present

## 2020-10-02 DIAGNOSIS — R3911 Hesitancy of micturition: Secondary | ICD-10-CM | POA: Diagnosis not present

## 2020-10-02 DIAGNOSIS — M6281 Muscle weakness (generalized): Secondary | ICD-10-CM | POA: Diagnosis not present

## 2020-10-03 ENCOUNTER — Telehealth (HOSPITAL_COMMUNITY): Payer: Self-pay

## 2020-10-03 NOTE — Telephone Encounter (Signed)
Spoke to patient regarding [place] therapy. Due to physical disabilities pt is unable to sit and requires a treatment that can be done while standing. TMS cannot accommodate her needs. Pt is not a candidate for [facility].

## 2020-10-08 ENCOUNTER — Telehealth (HOSPITAL_COMMUNITY): Payer: Self-pay | Admitting: *Deleted

## 2020-10-08 ENCOUNTER — Other Ambulatory Visit (HOSPITAL_COMMUNITY): Payer: Self-pay | Admitting: *Deleted

## 2020-10-08 DIAGNOSIS — F411 Generalized anxiety disorder: Secondary | ICD-10-CM

## 2020-10-08 DIAGNOSIS — F331 Major depressive disorder, recurrent, moderate: Secondary | ICD-10-CM

## 2020-10-08 MED ORDER — BUPROPION HCL 100 MG PO TABS: ORAL_TABLET | ORAL | 0 refills | Status: DC

## 2020-10-08 NOTE — Telephone Encounter (Signed)
She should be taking 100 mg along with 75 mg to a total dose of 175.  We increased Wellbutrin from 150-1 75.  Please check my last note.  Thanks

## 2020-10-08 NOTE — Telephone Encounter (Signed)
So if patient is taking Wellbutrin 175 mg daily than she can try 200 mg daily, we may go up on her next visit. Please call new prescription. Higher dose can cause seizure so need to be carefull about titration.

## 2020-10-08 NOTE — Telephone Encounter (Signed)
Pt called requesting increasing Wellbutrin to 150 mg bid due to "severe depression". Pt currently on Wellbutrin XL 100 mg qd. Pt has an upcoming appointment on 10/31/20. Please review.

## 2020-10-08 NOTE — Telephone Encounter (Signed)
This nurse spoke with pt regarding increased dose of Wellbutrin from 175 mg Qd to 200 mg Qd. Med ed completed as regards to lowering seizure threshold. Pt verbalizes understanding and will call with any questions or concerns.

## 2020-10-08 NOTE — Telephone Encounter (Signed)
Correct however pt is asking to increase to 150 mg BID. Pt is extremely sad, tearful, passive SI. Pt is able to contract for safety and states that she "would never act on it because I have to be strong for my daughter". Pt has an appointment this week she thinks with her therapist. Please review and advise.

## 2020-10-11 DIAGNOSIS — F5001 Anorexia nervosa, restricting type: Secondary | ICD-10-CM | POA: Diagnosis not present

## 2020-10-11 DIAGNOSIS — F331 Major depressive disorder, recurrent, moderate: Secondary | ICD-10-CM | POA: Diagnosis not present

## 2020-10-11 DIAGNOSIS — F411 Generalized anxiety disorder: Secondary | ICD-10-CM | POA: Diagnosis not present

## 2020-10-13 ENCOUNTER — Other Ambulatory Visit: Payer: Self-pay

## 2020-10-13 ENCOUNTER — Ambulatory Visit
Admission: EM | Admit: 2020-10-13 | Discharge: 2020-10-13 | Disposition: A | Discharge status: Home / Self Care | Payer: PPO | Attending: Emergency Medicine | Admitting: Emergency Medicine

## 2020-10-13 DIAGNOSIS — R300 Dysuria: Secondary | ICD-10-CM

## 2020-10-13 DIAGNOSIS — N3001 Acute cystitis with hematuria: Secondary | ICD-10-CM | POA: Diagnosis not present

## 2020-10-13 LAB — POCT URINALYSIS DIP (MANUAL ENTRY)
Bilirubin, UA: NEGATIVE
Glucose, UA: NEGATIVE mg/dL
Nitrite, UA: POSITIVE — AB
Protein Ur, POC: 100 mg/dL — AB
Spec Grav, UA: 1.015 (ref 1.010–1.025)
Urobilinogen, UA: 0.2 E.U./dL
pH, UA: 5 (ref 5.0–8.0)

## 2020-10-13 MED ORDER — PHENAZOPYRIDINE HCL 200 MG PO TABS: 200.0000 mg | ORAL_TABLET | Freq: Three times a day (TID) | ORAL | 0 refills | Status: DC

## 2020-10-13 MED ORDER — NITROFURANTOIN MONOHYD MACRO 100 MG PO CAPS: 100.0000 mg | ORAL_CAPSULE | Freq: Two times a day (BID) | ORAL | 0 refills | Status: DC
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
## 2020-10-13 NOTE — ED Triage Notes (Signed)
Pt presents with dysuria and lower abdominal pain and dysuria for past week

## 2020-10-13 NOTE — Discharge Instructions (Addendum)
Urine concerning for UTI Urine culture sent.  We will call you with abnormal results.   Push fluids and get plenty of rest.   Take antibiotic as directed and to completion Take pyridium as prescribed and as needed for symptomatic relief Follow up with PCP if symptoms persists Return here or go to ER if you have any new or worsening symptoms such as fever, worsening abdominal pain, nausea/vomiting, flank pain, etc..Marland Kitchen

## 2020-10-14 ENCOUNTER — Emergency Department (HOSPITAL_COMMUNITY): Payer: PPO

## 2020-10-14 ENCOUNTER — Ambulatory Visit: Payer: Self-pay

## 2020-10-14 ENCOUNTER — Emergency Department (HOSPITAL_COMMUNITY)
Admission: EM | Admit: 2020-10-14 | Discharge: 2020-10-14 | Disposition: A | Discharge status: Home / Self Care | Payer: PPO | Attending: Emergency Medicine | Admitting: Emergency Medicine

## 2020-10-14 DIAGNOSIS — Z9884 Bariatric surgery status: Secondary | ICD-10-CM | POA: Insufficient documentation

## 2020-10-14 DIAGNOSIS — E282 Polycystic ovarian syndrome: Secondary | ICD-10-CM | POA: Insufficient documentation

## 2020-10-14 DIAGNOSIS — R1032 Left lower quadrant pain: Secondary | ICD-10-CM | POA: Insufficient documentation

## 2020-10-14 DIAGNOSIS — N12 Tubulo-interstitial nephritis, not specified as acute or chronic: Secondary | ICD-10-CM

## 2020-10-14 DIAGNOSIS — Z881 Allergy status to other antibiotic agents status: Secondary | ICD-10-CM | POA: Insufficient documentation

## 2020-10-14 DIAGNOSIS — R300 Dysuria: Secondary | ICD-10-CM | POA: Diagnosis not present

## 2020-10-14 DIAGNOSIS — Z87891 Personal history of nicotine dependence: Secondary | ICD-10-CM | POA: Insufficient documentation

## 2020-10-14 DIAGNOSIS — N390 Urinary tract infection, site not specified: Secondary | ICD-10-CM | POA: Diagnosis present

## 2020-10-14 DIAGNOSIS — K828 Other specified diseases of gallbladder: Secondary | ICD-10-CM | POA: Diagnosis not present

## 2020-10-14 DIAGNOSIS — D259 Leiomyoma of uterus, unspecified: Secondary | ICD-10-CM | POA: Diagnosis not present

## 2020-10-14 DIAGNOSIS — J45909 Unspecified asthma, uncomplicated: Secondary | ICD-10-CM | POA: Diagnosis not present

## 2020-10-14 DIAGNOSIS — Z880 Allergy status to penicillin: Secondary | ICD-10-CM | POA: Diagnosis not present

## 2020-10-14 DIAGNOSIS — Z888 Allergy status to other drugs, medicaments and biological substances status: Secondary | ICD-10-CM | POA: Insufficient documentation

## 2020-10-14 DIAGNOSIS — N261 Atrophy of kidney (terminal): Secondary | ICD-10-CM | POA: Diagnosis not present

## 2020-10-14 DIAGNOSIS — Z7951 Long term (current) use of inhaled steroids: Secondary | ICD-10-CM | POA: Insufficient documentation

## 2020-10-14 DIAGNOSIS — N2881 Hypertrophy of kidney: Secondary | ICD-10-CM | POA: Diagnosis not present

## 2020-10-14 LAB — URINALYSIS, ROUTINE W REFLEX MICROSCOPIC
Bilirubin Urine: NEGATIVE
Glucose, UA: NEGATIVE mg/dL
Ketones, ur: 20 mg/dL — AB
Leukocytes,Ua: NEGATIVE
Nitrite: POSITIVE — AB
Protein, ur: 100 mg/dL — AB
Specific Gravity, Urine: 1.017 (ref 1.005–1.030)
WBC, UA: >50 WBC/hpf — ABNORMAL HIGH (ref 0–5)
pH: 6 (ref 5.0–8.0)

## 2020-10-14 LAB — CBC WITH DIFFERENTIAL/PLATELET
Abs Immature Granulocytes: 0.06 10*3/uL (ref 0.00–0.07)
Basophils Absolute: 0 10*3/uL (ref 0.0–0.1)
Basophils Relative: 0 %
Eosinophils Absolute: 0 10*3/uL (ref 0.0–0.5)
Eosinophils Relative: 0 %
HCT: 41.4 % (ref 36.0–46.0)
Hemoglobin: 13.5 g/dL (ref 12.0–15.0)
Immature Granulocytes: 0 %
Lymphocytes Relative: 6 %
Lymphs Abs: 0.9 10*3/uL (ref 0.7–4.0)
MCH: 33.8 pg (ref 26.0–34.0)
MCHC: 32.6 g/dL (ref 30.0–36.0)
MCV: 103.8 fL — ABNORMAL HIGH (ref 80.0–100.0)
Monocytes Absolute: 0.8 10*3/uL (ref 0.1–1.0)
Monocytes Relative: 6 %
Neutro Abs: 11.8 10*3/uL — ABNORMAL HIGH (ref 1.7–7.7)
Neutrophils Relative %: 88 %
Platelets: 232 10*3/uL (ref 150–400)
RBC: 3.99 MIL/uL (ref 3.87–5.11)
RDW: 12.5 % (ref 11.5–15.5)
WBC: 13.6 10*3/uL — ABNORMAL HIGH (ref 4.0–10.5)
nRBC: 0 % (ref 0.0–0.2)

## 2020-10-14 LAB — COMPREHENSIVE METABOLIC PANEL
ALT: 23 U/L (ref 0–44)
AST: 19 U/L (ref 15–41)
Albumin: 4.2 g/dL (ref 3.5–5.0)
Alkaline Phosphatase: 71 U/L (ref 38–126)
Anion gap: 8 (ref 5–15)
BUN: 8 mg/dL (ref 6–20)
CO2: 30 mmol/L (ref 22–32)
Calcium: 8.4 mg/dL — ABNORMAL LOW (ref 8.9–10.3)
Chloride: 99 mmol/L (ref 98–111)
Creatinine, Ser: 0.75 mg/dL (ref 0.44–1.00)
GFR, Estimated: >60 mL/min (ref >60)
Glucose, Bld: 104 mg/dL — ABNORMAL HIGH (ref 70–99)
Potassium: 3.7 mmol/L (ref 3.5–5.1)
Sodium: 137 mmol/L (ref 135–145)
Total Bilirubin: 0.6 mg/dL (ref 0.3–1.2)
Total Protein: 6.8 g/dL (ref 6.5–8.1)

## 2020-10-14 LAB — I-STAT BETA HCG BLOOD, ED (MC, WL, AP ONLY): I-stat hCG, quantitative: <5 m[IU]/mL (ref <5)

## 2020-10-14 LAB — LIPASE, BLOOD: Lipase: 25 U/L (ref 11–51)

## 2020-10-14 LAB — LACTIC ACID, PLASMA
Lactic Acid, Venous: 1 mmol/L (ref 0.5–1.9)
Lactic Acid, Venous: 1.4 mmol/L (ref 0.5–1.9)

## 2020-10-14 MED ORDER — PANTOPRAZOLE SODIUM 40 MG IV SOLR
40.0000 mg | Freq: Once | INTRAVENOUS | Status: AC
  Administered 2020-10-14: 40 mg via INTRAVENOUS
  Filled 2020-10-14: qty 40

## 2020-10-14 MED ORDER — HYDROMORPHONE HCL 1 MG/ML IJ SOLN
1.0000 mg | Freq: Once | INTRAMUSCULAR | Status: AC
  Administered 2020-10-14: 1 mg via INTRAVENOUS
  Filled 2020-10-14: qty 1

## 2020-10-14 MED ORDER — METOCLOPRAMIDE HCL 5 MG/ML IJ SOLN
10.0000 mg | Freq: Once | INTRAMUSCULAR | Status: AC
  Administered 2020-10-14: 10 mg via INTRAVENOUS
  Filled 2020-10-14: qty 2

## 2020-10-14 MED ORDER — SODIUM CHLORIDE 0.9 % IV SOLN
12.5000 mg | Freq: Four times a day (QID) | INTRAVENOUS | Status: DC | PRN
  Administered 2020-10-14: 12.5 mg via INTRAVENOUS
  Filled 2020-10-14 (×2): qty 0.5

## 2020-10-14 MED ORDER — SODIUM CHLORIDE 0.9 % IV BOLUS
1000.0000 mL | Freq: Once | INTRAVENOUS | Status: AC
  Administered 2020-10-14: 1000 mL via INTRAVENOUS

## 2020-10-14 MED ORDER — ACETAMINOPHEN 500 MG PO TABS
1000.0000 mg | ORAL_TABLET | Freq: Once | ORAL | Status: AC
  Administered 2020-10-14: 1000 mg via ORAL
  Filled 2020-10-14: qty 2

## 2020-10-14 MED ORDER — CEFPODOXIME PROXETIL 200 MG PO TABS: 200.0000 mg | ORAL_TABLET | Freq: Two times a day (BID) | ORAL | 0 refills | Status: AC

## 2020-10-14 MED ORDER — SODIUM CHLORIDE 0.9 % IV SOLN
1.0000 g | Freq: Once | INTRAVENOUS | Status: AC
  Administered 2020-10-14: 1 g via INTRAVENOUS
  Filled 2020-10-14: qty 10

## 2020-10-14 MED ORDER — PROMETHAZINE HCL 25 MG PO TABS: 25.0000 mg | ORAL_TABLET | Freq: Four times a day (QID) | ORAL | 0 refills | Status: DC | PRN

## 2020-10-14 NOTE — Discharge Instructions (Addendum)
You were given a prescription for antibiotics. Please take the antibiotic prescription fully.   Use phenergan as directed for nausea and vomiting   Please follow up with your primary doctor within the next 5-7 days.  If you do not have a primary care provider, information for a healthcare clinic has been provided for you to make arrangements for follow up care. Please return to the ER sooner if you have any new or worsening symptoms, or if you have any of the following symptoms:  Abdominal pain that does not go away.  You have a fever.  You keep throwing up (vomiting).  The pain is felt only in portions of the abdomen. Pain in the right side could possibly be appendicitis. In an adult, pain in the left lower portion of the abdomen could be colitis or diverticulitis.  You pass bloody or black tarry stools.  There is bright red blood in the stool.  The constipation stays for more than 4 days.  There is belly (abdominal) or rectal pain.  You do not seem to be getting better.  You have any questions or concerns.

## 2020-10-14 NOTE — ED Notes (Signed)
Pt ambulatory to waiting room. Pt verbalized understanding of discharge instructions.   

## 2020-10-14 NOTE — ED Provider Notes (Signed)
[facility] Provider Note   CSN: [id number] Arrival date & time: 10/14/20  4259     History Chief Complaint  Patient presents with   Dysuria    Katelyn [name] is a 45 y.o. female.  HPI  45 year old female the history of anemia, anorexia, Zaidi, asthma, benign juvenile melanoma, constipation, depression, headache, heart murmur, heart murmur, blood clots, neurovascular disorder, obesity, PCOS, pneumonia, who presents the emergency department today for evaluation of urinary tract infection.  Patient states that for the last few days she has had lower abdominal pain.  She is also had some hematuria, dysuria and frequency.  She has chronic back pain and is not sure if she has had any pain in her flanks.  She was seen in urgent care yesterday and told that her urine was abnormal so she was started on Macrobid and Pyridium.  This morning she developed a fever up to 102 and has had severe nausea.  She denies any associated vomiting, diarrhea.  She was contacted by urgent care and advised to come to the ED for rule out sepsis.  Past Medical History:  Diagnosis Date   Anemia    Anorexia    Anxiety    Asthma    controlled with meds   Benign juvenile melanoma    Chronic headaches    Colon polyps    found on colonoscopy 56/38/7564   Complication of anesthesia    itching after epidural for c section   Constipation    Depression    Depression    several suicide attempts, hospitaluzed in 2012 for this; hx pf ECT treatments ; pt sees Dr. Adele Schilder pyschiatrist  and doing well on medication   Headache    Heart murmur    as a child - no one mentions hearing murmur anymore   Heartburn    no meds   History of pneumonia    x 3  years ago - no recent problems   Hx of blood clots    Neuromuscular disorder ([place])    small fiber neuropathy   Obesity    Pneumonia    Polycystic ovary    takes metformin to treat   Skin lesion    Excisional biopsy of moles - none  cancerous   Sleep apnea    yrs ago - diagnosed mild sleep apnea - did not have to use cpap     Patient Active Problem List   Diagnosis Date Noted   Vitamin B 12 deficiency 10/16/2019   Elevated LFTs 10/16/2019   Ulcer of toe of left foot ([place]) 05/10/2019   Asthma, exogenous, unspecified asthma severity, uncomplicated 33/29/5188   Multiple environmental allergies 08/29/2018   B12 deficiency anemia 06/21/2018   Venous insufficiency 04/23/2018   Onychomycosis of toenail 04/17/2018   Chronic myofascial pain 03/18/2018   Small fiber neuropathy 03/18/2018   Idiopathic small fiber sensory neuropathy 02/23/2018   Varicose veins of both lower extremities 01/13/2018   Generalized pain 10/16/2017   Irregular menses 10/16/2017   Cervical spondylosis without myelopathy 08/28/2017   Migraine with aura and without status migrainosus, not intractable 07/22/2017   Lumbar degenerative disc disease 07/20/2017   Spondylosis of lumbar spine 07/20/2017   Coccydynia 03/12/2017   Bariatric surgery status 05/02/2016   Major depressive disorder, recurrent episode, moderate ([place]) 02/18/2016   Tachy-brady syndrome ([place]) 12/19/2015   Candidal vulvovaginitis 10/15/2015   Iron deficiency anemia 06/10/2015   Sleep disorder 06/01/2013   Generalized anxiety disorder 05/30/2013  Chronic headaches 07/01/2012   Depression, major, recurrent, severe with psychosis ([place]) 06/17/2012   Fatigue 03/05/2012   Family hx colonic polyps 03/03/2012   Eating disorder 12/18/2011   POLYCYSTIC OVARIAN DISEASE 09/01/2008   Intermittent left-sided chest pain 08/02/2008    Past Surgical History:  Procedure Laterality Date   BREAST ENHANCEMENT SURGERY Bilateral 02/11/2017   BREAST SURGERY N/A    Phreesia 10/10/2019   BREATH TEK H PYLORI N/A 07/22/2013   Procedure: BREATH TEK H PYLORI;  Surgeon: Edward Jolly, MD;  Location: [facility];  Service: General;  Laterality: N/A;   c sections  08/28/14   x 2   CESAREAN  SECTION  2004, 2007   x 2   CESAREAN SECTION N/A    Phreesia 10/10/2019   COLONOSCOPY  2013   CYSTOSCOPY WITH URETHRAL DILATATION  age 29    GASTRIC ROUX-EN-Y N/A 10/31/2013   Procedure: LAPAROSCOPIC ROUX-EN-Y GASTRIC BYPASS WITH UPPER ENDOSCOPY;  Surgeon: Edward Jolly, MD;  Location: [facility];  Service: General;  Laterality: N/A;   ingrown toenail Right    Ingrown nail on great right toe   kidney stent  08/28/14   mole excision     "benign juvenile melanoma" removed from left leg - inner thigh   POLYPECTOMY     2013   ROUX-EN-Y PROCEDURE  08/28/14   SKIN LESION EXCISION     back   TONSILLECTOMY     TONSILLECTOMY  age 12   TUBAL LIGATION       OB History     Gravida  0   Para  0   Term  0   Preterm  0   AB  0   Living         SAB  0   IAB  0   Ectopic  0   Multiple      Live Births              Family History  Problem Relation Age of Onset   Hypercholesterolemia Mother    Hypertension Mother        Iterstitial Cystist   Hyperlipidemia Mother    Cancer Mother 25       breast    Depression Brother    Alcohol abuse Brother    Colon polyps Father    Depression Father    Irritable bowel syndrome Father    Alcohol abuse Father    Colon cancer Paternal Aunt 34   Heart attack Paternal Grandfather    Kidney cancer Paternal Grandfather    Cancer Maternal Grandfather        unknown type   Esophageal cancer Neg Hx    Stomach cancer Neg Hx     Social History   Tobacco Use   Smoking status: Former    Packs/day: 1.00    Years: 5.00    Pack years: 5.00    Types: Cigarettes    Start date: 30    Quit date: 08/26/1997    Years since quitting: 23.1   Smokeless tobacco: Never  Vaping Use   Vaping Use: Never used  Substance Use Topics   Alcohol use: No    Alcohol/week: 0.0 standard drinks   Drug use: No    Home Medications Prior to Admission medications   Medication Sig Start Date End Date Taking? Authorizing Provider  cefpodoxime (VANTIN)  200 MG tablet Take 1 tablet (200 mg total) by mouth 2 (two) times daily for 10 days. 10/14/20 10/24/20 Yes  Turon Kilmer S, PA-C  promethazine (PHENERGAN) 25 MG tablet Take 1 tablet (25 mg total) by mouth every 6 (six) hours as needed for nausea or vomiting. 10/14/20  Yes Zaid Tomes S, PA-C  albuterol (VENTOLIN HFA) 108 (90 Base) MCG/ACT inhaler Inhale 2 puffs into the lungs every 6 (six) hours as needed for wheezing or shortness of breath. 03/20/20   Margaretha Seeds, MD  Ascorbic Acid (VITAMIN C) 100 MG tablet Take 100 mg by mouth daily.    [provider]  budesonide-formoterol (SYMBICORT) 80-4.5 MCG/ACT inhaler Inhale 2 puffs into the lungs 2 (two) times daily. 07/25/19   Margaretha Seeds, MD  buPROPion [facility]) 100 MG tablet Take two tablets (200 mg total dose) by mouth once daily. 10/08/20   Arfeen, Arlyce Harman, MD  calcium carbonate (OS-CAL - DOSED IN MG OF ELEMENTAL CALCIUM) 1250 (500 Ca) MG tablet Take 1 tablet by mouth.    [provider]  cholecalciferol (VITAMIN D3) 25 MCG (1000 UT) tablet Take 1,000 Units by mouth daily.    [provider]  clonazePAM (KLONOPIN) 0.5 MG tablet Take 1 tablet (0.5 mg total) by mouth 3 (three) times daily as needed for anxiety. 10/01/20 04/07/24  Arfeen, Arlyce Harman, MD  Cyanocobalamin (B-12) 1000 MCG SUBL Place 1,000 mcg under the tongue daily. 01/01/16   Holley Bouche, NP  gabapentin (NEURONTIN) 600 MG tablet Take 600 mg by mouth 3 (three) times daily.    [provider]  HYDROcodone-acetaminophen (NORCO) 7.5-325 MG tablet Take 1 tablet by mouth every 6 (six) hours as needed for moderate pain.    [provider]  Lactobacillus Rhamnosus, GG, (CULTURELLE PO) Take by mouth.    [provider]  lisdexamfetamine (VYVANSE) 20 MG capsule Take 1 capsule (20 mg total) by mouth daily. 09/27/20   Arfeen, Arlyce Harman, MD  magnesium oxide (MAG-OX) 400 MG tablet Take 400 mg by mouth daily.    [provider]   miconazole (MONISTAT 7) 2 % vaginal cream Place 1 Applicatorful vaginally at bedtime. Apply once daily to affected area for 1 week , then , as needed 10/10/19   Fayrene Helper, MD  Multiple Vitamin (MULTIVITAMIN WITH MINERALS) TABS tablet Take 3 tablets by mouth 2 (two) times daily.     [provider]  naloxone [facility]) nasal spray 4 mg/0.1 mL Place into the nose as needed.  03/18/18   [provider]  nitrofurantoin, macrocrystal-monohydrate, (MACROBID) 100 MG capsule Take 1 capsule (100 mg total) by mouth 2 (two) times daily. 10/13/20   Wurst, [place], PA-C  ondansetron (ZOFRAN) 4 MG tablet Take 1 tablet (4 mg total) by mouth every 8 (eight) hours as needed for nausea or vomiting. Take one tablet by mouth 4 times weekly, as needed, for nausea 02/22/20   Kathyrn Drown, MD  phenazopyridine (PYRIDIUM) 200 MG tablet Take 1 tablet (200 mg total) by mouth 3 (three) times daily. 10/13/20   Wurst, [place], PA-C  PREMARIN vaginal cream 2 times a week 01/01/18   [provider]  temazepam (RESTORIL) 30 MG capsule Take 1 capsule (30 mg total) by mouth at bedtime. 10/01/20   Arfeen, Arlyce Harman, MD  tiZANidine (ZANAFLEX) 4 MG tablet Take 4 mg by mouth at bedtime.    [provider]  Zinc 100 MG TABS Take 1 tablet by mouth daily.    [provider]  ziprasidone (GEODON) 80 MG capsule Take 1 capsule (80 mg total) by mouth at bedtime. 10/01/20  Kathlee Nations, MD    Allergies    Ciprofloxacin, Duloxetine hcl, Lyrica [pregabalin], Feraheme [ferumoxytol], Penicillins, Propofol, and Vit b12-methionine-inos-chol  Review of Systems   Review of Systems  Constitutional:  Positive for chills, diaphoresis and fever.  HENT:  Negative for ear pain and sore throat.   Eyes:  Negative for pain and visual disturbance.  Respiratory:  Negative for cough and shortness of breath.   Cardiovascular:  Negative for chest pain and palpitations.  Gastrointestinal:  Positive for abdominal  pain and nausea. Negative for constipation, diarrhea and vomiting.  Genitourinary:  Positive for dysuria, frequency and hematuria. Negative for vaginal bleeding and vaginal discharge.  Musculoskeletal:  Positive for back pain (chronic).  Skin:  Negative for rash.  Neurological:  Negative for seizures and syncope.  All other systems reviewed and are negative.  Physical Exam Updated Vital Signs BP 114/71 (BP Location: Right Arm)   Pulse 97   Temp 99.9 F (37.7 C) (Oral)   Resp 17   SpO2 95%   Physical Exam Vitals and nursing note reviewed.  Constitutional:      General: She is not in acute distress.    Appearance: She is well-developed.  HENT:     Head: Normocephalic and atraumatic.  Eyes:     Conjunctiva/sclera: Conjunctivae normal.  Cardiovascular:     Rate and Rhythm: Regular rhythm. Tachycardia present.     Heart sounds: Normal heart sounds. No murmur heard. Pulmonary:     Effort: Pulmonary effort is normal. No respiratory distress.     Breath sounds: Normal breath sounds. No stridor. No wheezing or rales.  Abdominal:     General: Bowel sounds are normal.     Palpations: Abdomen is soft.     Tenderness: There is abdominal tenderness (LLQ). There is left CVA tenderness. There is no right CVA tenderness, guarding or rebound.  Musculoskeletal:     Cervical back: Neck supple.  Skin:    General: Skin is warm and dry.  Neurological:     Mental Status: She is alert.    ED Results / Procedures / Treatments   Labs (all labs ordered are listed, but only abnormal results are displayed) Labs Reviewed  COMPREHENSIVE METABOLIC PANEL - Abnormal; Notable for the following components:      Result Value   Glucose, Bld 104 (*)    Calcium 8.4 (*)    All other components within normal limits  CBC WITH DIFFERENTIAL/PLATELET - Abnormal; Notable for the following components:   WBC 13.6 (*)    MCV 103.8 (*)    Neutro Abs 11.8 (*)    All other components within normal limits   URINALYSIS, ROUTINE W REFLEX MICROSCOPIC - Abnormal; Notable for the following components:   Color, Urine AMBER (*)    APPearance HAZY (*)    Hgb urine dipstick SMALL (*)    Ketones, ur 20 (*)    Protein, ur 100 (*)    Nitrite POSITIVE (*)    WBC, UA >50 (*)    Bacteria, UA FEW (*)    All other components within normal limits  CULTURE, BLOOD (ROUTINE X 2)  CULTURE, BLOOD (ROUTINE X 2) W REFLEX TO ID PANEL  URINE CULTURE  LIPASE, BLOOD  LACTIC ACID, PLASMA  LACTIC ACID, PLASMA  I-STAT BETA HCG BLOOD, ED ([facility], [facility], [facility] ONLY)    EKG None  Radiology CT Renal Stone Study  Result Date: 10/14/2020 CLINICAL DATA:  Flank pain EXAM: CT ABDOMEN AND PELVIS WITHOUT CONTRAST TECHNIQUE:  Multidetector CT imaging of the abdomen and pelvis was performed following the standard protocol without IV contrast. COMPARISON:  MRI pelvis 10/22/2019 FINDINGS: Lower chest: Included lung bases are clear. Heart size within normal limits. Partially visualized right breast prosthesis. Hepatobiliary: Unremarkable unenhanced appearance of the liver. No focal liver lesion identified. Gallbladder is mildly dilated but appears otherwise within normal limits. No hyperdense gallstone. No biliary dilatation. Pancreas: Unremarkable. No pancreatic ductal dilatation or surrounding inflammatory changes. Spleen: Normal in size without focal abnormality. Adrenals/Urinary Tract: Unremarkable adrenal glands. Left kidney is slightly atrophic relative to the right. There is left-sided perinephric stranding. No left-sided renal or ureteral stone identified. No hydronephrosis. Hypertrophied right kidney, which appears otherwise unremarkable without renal stone or hydronephrosis. Urinary bladder wall appears circumferentially thickened, although is incompletely distended. Stomach/Bowel: Postsurgical change from Roux-en-Y gastric bypass without evidence of obstruction or other complication. No dilated loops of bowel. Moderate volume of stool  throughout the colon. A normal appendix is seen in the right lower quadrant (series 2, image 55). Vascular/Lymphatic: No significant vascular findings are present. No enlarged abdominal or pelvic lymph nodes. Reproductive: Fibroid uterus.  No adnexal masses. Other: Small volume free fluid within the cul-de-sac, which may be physiologic. No organized abdominopelvic fluid collection. No pneumoperitoneum. No abdominal wall hernia. Musculoskeletal: No acute or significant osseous findings. IMPRESSION: 1. Left-sided perinephric stranding without evidence of renal or ureteral stone. Findings may represent a recently passed stone or ascending urinary tract infection. Correlation with urinalysis is recommended. 2. Urinary bladder wall appears circumferentially thickened concerning for cystitis. 3. Postsurgical change from Roux-en-Y gastric bypass without evidence of obstruction or other complication. 4. Moderate volume of stool throughout the colon. 5. Fibroid uterus. 6. Small volume free fluid within the cul-de-sac, which may be physiologic. Electronically Signed   By: Davina Poke D.O.   On: 10/14/2020 12:26    Procedures Procedures   Medications Ordered in ED Medications  promethazine (PHENERGAN) 12.5 mg in sodium chloride 0.9 % 50 mL IVPB (12.5 mg Intravenous New Bag/Given 10/14/20 1148)  sodium chloride 0.9 % bolus 1,000 mL (0 mLs Intravenous Stopped 10/14/20 1249)  HYDROmorphone (DILAUDID) injection 1 mg (1 mg Intravenous Given 10/14/20 1148)  acetaminophen (TYLENOL) tablet 1,000 mg (1,000 mg Oral Given 10/14/20 1148)  HYDROmorphone (DILAUDID) injection 1 mg (1 mg Intravenous Given 10/14/20 1316)  metoCLOPramide (REGLAN) injection 10 mg (10 mg Intravenous Given 10/14/20 1316)  cefTRIAXone (ROCEPHIN) 1 g in sodium chloride 0.9 % 100 mL IVPB (1 g Intravenous New Bag/Given 10/14/20 1316)  pantoprazole (PROTONIX) injection 40 mg (40 mg Intravenous Given 10/14/20 1316)    ED Course  I have reviewed the triage  vital signs and the nursing notes.  Pertinent labs & imaging results that were available during my care of the patient were reviewed by me and considered in my medical decision making (see chart for details).    MDM Rules/Calculators/A&P                          45 year old female presenting the emergency department today for evaluation of UTI.  Was diagnosed at urgent care with UTI yesterday but today developed fevers.  On exam she also has some left CVA tenderness suggestive of pyelonephritis.  Reviewed/interpreted labs CBC with mild leukocytosis CMP grossly unremarkable Lipase negative Beta-hCG negative UA suggestive of infection, ceftriaxone given in the ED.  Urine culture was sent  CT renal stone obtained and personally reviewed/interpreted -  1. Left-sided perinephric stranding  without evidence of renal or ureteral stone. Findings may represent a recently passed stone or ascending urinary tract infection. Correlation with urinalysis is recommended. 2. Urinary bladder wall appears circumferentially thickened concerning for cystitis. 3. Postsurgical change from Roux-en-Y gastric bypass without evidence of obstruction or other complication. 4. Moderate volume of stool throughout the colon. 5. Fibroid uterus. 6. Small volume free fluid within the cul-de-sac, which may be physiologic  On reassessment after pain medicines, antibiotics, Zofran and IV fluids patient is feeling much improved.  She has had been able to tolerate p.o.  We discussed admission for further treatment versus discharge with antibiotics, nausea medicines and close follow-up with PCP.  She prefers to be discharged at this time.  She understands that she is to follow-up closely with PCP and return to the ED for any new or worsening symptoms.  All questions were answered.  Patient stable for discharge.  Final Clinical Impression(s) / ED Diagnoses Final diagnoses:  Pyelonephritis    Rx / DC Orders ED Discharge Orders           Ordered    cefpodoxime (VANTIN) 200 MG tablet  2 times daily        10/14/20 1452    promethazine (PHENERGAN) 25 MG tablet  Every 6 hours PRN        10/14/20 1452             Bryttany Tortorelli S, PA-C 10/14/20 1455    Milton Ferguson, MD 10/15/20 [phone]

## 2020-10-15 LAB — URINE CULTURE: Culture: >=100000 — AB

## 2020-10-16 ENCOUNTER — Encounter: Payer: Self-pay | Admitting: Family Medicine

## 2020-10-16 LAB — URINE CULTURE: Culture: 40000 — AB

## 2020-10-19 LAB — CULTURE, BLOOD (ROUTINE X 2)
Culture: NO GROWTH
Culture: NO GROWTH
Special Requests: ADEQUATE
Special Requests: ADEQUATE

## 2020-10-22 ENCOUNTER — Encounter: Payer: Self-pay | Admitting: Family Medicine

## 2020-10-22 ENCOUNTER — Ambulatory Visit (INDEPENDENT_AMBULATORY_CARE_PROVIDER_SITE_OTHER): Payer: PPO | Admitting: Family Medicine

## 2020-10-22 ENCOUNTER — Other Ambulatory Visit: Payer: Self-pay

## 2020-10-22 VITALS — BP 120/85 | HR 103 | Temp 97.3°F | Ht 65.0 in

## 2020-10-22 DIAGNOSIS — I495 Sick sinus syndrome: Secondary | ICD-10-CM

## 2020-10-22 DIAGNOSIS — Z8659 Personal history of other mental and behavioral disorders: Secondary | ICD-10-CM

## 2020-10-22 DIAGNOSIS — N159 Renal tubulo-interstitial disease, unspecified: Secondary | ICD-10-CM

## 2020-10-22 DIAGNOSIS — Z9884 Bariatric surgery status: Secondary | ICD-10-CM | POA: Diagnosis not present

## 2020-10-22 LAB — POCT URINALYSIS DIPSTICK
Spec Grav, UA: 1.01 (ref 1.010–1.025)
pH, UA: 7.5 (ref 5.0–8.0)

## 2020-10-22 MED ORDER — FLUCONAZOLE 150 MG PO TABS
ORAL_TABLET | ORAL | 1 refills | Status: DC
Start: 2020-10-22 — End: 2021-04-09

## 2020-10-22 NOTE — Progress Notes (Signed)
   Subjective:    Patient ID: Katelyn [name], female    DOB: Aug 27, 1975, 45 y.o.   MRN: [id number]  HPIfollow up on urgent care on 6/18 and ED visit on 6/19. Pt was told she needs bone density test.  Would like urine rechecked. She is taking antibiotic - vantin.  ER note urgent care note lab work ultrasound CAT scan reviewed Results for orders placed or performed in visit on 10/22/20  POCT urinalysis dipstick  Result Value Ref Range   Color, UA     Clarity, UA     Glucose, UA     Bilirubin, UA     Ketones, UA     Spec Grav, UA 1.010 1.010 - 1.025   Blood, UA     pH, UA 7.5 5.0 - 8.0   Protein, UA     Urobilinogen, UA     Nitrite, UA     Leukocytes, UA     Appearance     Odor           Review of Systems     Objective:   Physical Exam General-in no acute distress Eyes-no discharge Lungs-respiratory rate normal, CTA CV-no murmurs,RRR Extremities skin warm dry no edema Neuro grossly normal Behavior normal, alert   Flanks nontender to percussion     Assessment & Plan:  1. Kidney infection Urine dipstick looks good finish out antibiotics patient can submit a urine next week to repeat if she desires - POCT urinalysis dipstick - fluconazole (DIFLUCAN) 150 MG tablet; Take one tab by mouth now, one in 4 days, one in 7 days  Dispense: 3 tablet; Refill: 1 - [facility] Bone Density Diflucan because of yeast related to urinary tract infection 2. Hx of anorexia nervosa Bone density ordered. - fluconazole (DIFLUCAN) 150 MG tablet; Take one tab by mouth now, one in 4 days, one in 7 days  Dispense: 3 tablet; Refill: 1 - [facility] Bone Density Patient does have a history of tachybradycardia syndrome she has not had any flareups recently  3. Hx of bariatric surgery Bone density ordered. - fluconazole (DIFLUCAN) 150 MG tablet; Take one tab by mouth now, one in 4 days, one in 7 days  Dispense: 3 tablet; Refill: 1 - [facility] Bone Density  Patient is disabled Patient has had significant  troubles with her breast implants she is getting evaluated by plastic surgery about the possibility of removing these  Follow-up within 3 to 4 months

## 2020-10-23 DIAGNOSIS — N898 Other specified noninflammatory disorders of vagina: Secondary | ICD-10-CM | POA: Diagnosis not present

## 2020-10-23 DIAGNOSIS — M62838 Other muscle spasm: Secondary | ICD-10-CM | POA: Diagnosis not present

## 2020-10-23 DIAGNOSIS — R208 Other disturbances of skin sensation: Secondary | ICD-10-CM | POA: Diagnosis not present

## 2020-10-23 DIAGNOSIS — B373 Candidiasis of vulva and vagina: Secondary | ICD-10-CM | POA: Diagnosis not present

## 2020-10-23 DIAGNOSIS — R3911 Hesitancy of micturition: Secondary | ICD-10-CM | POA: Diagnosis not present

## 2020-10-23 DIAGNOSIS — N3943 Post-void dribbling: Secondary | ICD-10-CM | POA: Diagnosis not present

## 2020-10-23 DIAGNOSIS — M6281 Muscle weakness (generalized): Secondary | ICD-10-CM | POA: Diagnosis not present

## 2020-10-25 DIAGNOSIS — F411 Generalized anxiety disorder: Secondary | ICD-10-CM | POA: Diagnosis not present

## 2020-10-25 DIAGNOSIS — F5001 Anorexia nervosa, restricting type: Secondary | ICD-10-CM | POA: Diagnosis not present

## 2020-10-25 DIAGNOSIS — F331 Major depressive disorder, recurrent, moderate: Secondary | ICD-10-CM | POA: Diagnosis not present

## 2020-10-31 ENCOUNTER — Other Ambulatory Visit: Payer: Self-pay

## 2020-10-31 ENCOUNTER — Telehealth (INDEPENDENT_AMBULATORY_CARE_PROVIDER_SITE_OTHER): Payer: PPO | Admitting: Psychiatry

## 2020-10-31 ENCOUNTER — Encounter (HOSPITAL_COMMUNITY): Payer: Self-pay | Admitting: Psychiatry

## 2020-10-31 DIAGNOSIS — F411 Generalized anxiety disorder: Secondary | ICD-10-CM

## 2020-10-31 DIAGNOSIS — F331 Major depressive disorder, recurrent, moderate: Secondary | ICD-10-CM | POA: Diagnosis not present

## 2020-10-31 DIAGNOSIS — F509 Eating disorder, unspecified: Secondary | ICD-10-CM

## 2020-10-31 MED ORDER — BUPROPION HCL 100 MG PO TABS: ORAL_TABLET | ORAL | 1 refills | Status: DC

## 2020-10-31 MED ORDER — ZIPRASIDONE HCL 80 MG PO CAPS: 80.0000 mg | ORAL_CAPSULE | Freq: Every day | ORAL | 1 refills | Status: DC

## 2020-10-31 MED ORDER — TEMAZEPAM 30 MG PO CAPS: 30.0000 mg | ORAL_CAPSULE | Freq: Every day | ORAL | 1 refills | Status: DC

## 2020-10-31 MED ORDER — CLONAZEPAM 0.5 MG PO TABS: 0.5000 mg | ORAL_TABLET | Freq: Three times a day (TID) | ORAL | 1 refills | Status: DC | PRN

## 2020-10-31 NOTE — Progress Notes (Signed)
Virtual Visit via Telephone Note  I connected with Katelyn [name] on 10/31/20 at  8:20 AM EDT by telephone and verified that I am speaking with the correct person using two identifiers.  Location: Patient: Home Provider: Home Office   I discussed the limitations, risks, security and privacy concerns of performing an evaluation and management service by telephone and the availability of in person appointments. I also discussed with the patient that there may be a patient responsible charge related to this service. The patient expressed understanding and agreed to proceed.   History of Present Illness: Patient is evaluated by phone session.  On the last visit we increased Wellbutrin to 175 mg and recommended [place] evaluation.  Patient did try higher dose of Wellbutrin with marginal improvement and then she called a few times wanted to go higher dose of Wellbutrin.  She also have [place] discussion with the coordinator but after finding that she has to drive 5 days a week and has to sit for the treatment she declined.  She still struggles with anxiety depression and worried about her chronic neuropathy.  On Father's Day she has a UTI and she is finishing antibiotic.  She admitted feeling very weak and tired.  She decided to stop the Vyvanse to help with anxiety and she noticed her anxiety is somewhat better.  She is sleeping 7 hours.  She denies any suicidal thoughts but is still struggling with chronic anxiety and nervousness.  She had tried to cut down watching news but she reported news does come anyway and she gets very nervous and anxious.  She is in therapy with nutritionist Lelon Huh and [name] for psychotherapy.  Patient told today since she had breast implant surgery she started to have neuropathy, anxiety and depression.  Now she is thinking to remove the breast implant and she has consultation end of this month.  She is very nervous about consultation as she feels if removing the breast  implant did not help the neuropathy.  We decided not to add more medication since she may go for surgery to remove the breast implant and we will consider after the visit with the surgeon.  She has no tremor or shakes or any EPS.  She denies any hallucination, suicidal thoughts or any delusions but still have a lot of anxiety and paranoia.  She admitted Geodon helping and she cut down her Klonopin twice a day since she is not taking Vyvanse.  Her sleep is better.  She denies any impulsive eating and reported her weight is stable.  Past Psychiatric History:  H/O multiple inpatient. Last admission Feb 2015.  Good response with ECT but scared to continue maintenance ECT.  Tried Cymbalta (suicidal thoughts), Lexapro, Prozac (sexual side effects), Abilify, lithium, Lamictal, Ritalin, Remeron, Risperdal, Neurontin, Vistaril, BuSpar and Valium.   Psychiatric Specialty Exam: Physical Exam  Review of Systems  Weight 180 lb (81.6 kg).Body mass index is 29.95 kg/m.  General Appearance: NA  Eye Contact:  NA  Speech:  Slow  Volume:  Decreased  Mood:  Anxious and Dysphoric  Affect:  NA  Thought Process:  Descriptions of Associations: Intact  Orientation:  Full (Time, Place, and Person)  Thought Content:  Rumination  Suicidal Thoughts:  No  Homicidal Thoughts:  No  Memory:  Immediate;   Good Recent;   Good Remote;   Fair  Judgement:  Fair  Insight:  Shallow  Psychomotor Activity:  NA  Concentration:  Concentration: Fair and Attention Span: Fair  Recall:  Smiley Houseman of Knowledge:  Good  Language:  Good  Akathisia:  No  Handed:  Right  AIMS (if indicated):     Assets:  Communication Skills Desire for Improvement Housing Social Support  ADL's:  Intact  Cognition:  WNL  Sleep:   7 hrs      Assessment and Plan: Eating disorder, unspecified type.  Major depressive disorder, recurrent.  Generalized anxiety disorder.  I reviewed her current medication.  Patient has multiple health issues and  she noticed started after breast implant surgery.  She is considering to remove the breast implant hoping her neuropathy and anxiety get better.  She has a consultation end of this month.  Since stopped the Vyvanse her anxiety is somewhat better and she is not taking Klonopin 3 times a day.  I encouraged to keep therapy with the therapist and nutritionist.  I reviewed blood work from recent emergency room visit for UTI.  She like to try Wellbutrin maximum 200 mg but like to take first pill in the morning and second at noon time.  We will continue temazepam 30 mg at bedtime, and Geodon 80 mg daily and Klonopin she can take 0.5 mg 2-3 times a day.  We talked about other medicine like Trintellix the patient does not want to try until she had a visit with the surgeon.  I agree with the plan.  I recommend to call us back if she is any question or any concern.  Follow-up in 6 weeks.  Follow Up Instructions:    I discussed the assessment and treatment plan with the patient. The patient was provided an opportunity to ask questions and all were answered. The patient agreed with the plan and demonstrated an understanding of the instructions.   The patient was advised to call back or seek an in-person evaluation if the symptoms worsen or if the condition fails to improve as anticipated.  I provided 17 minutes of non-face-to-face time during this encounter.   Kathlee Nations, MD

## 2020-11-02 ENCOUNTER — Ambulatory Visit (HOSPITAL_COMMUNITY)
Admission: RE | Admit: 2020-11-02 | Discharge: 2020-11-02 | Disposition: A | Discharge status: Home / Self Care | Payer: PPO | Source: Ambulatory Visit | Attending: Family Medicine | Admitting: Family Medicine

## 2020-11-02 ENCOUNTER — Other Ambulatory Visit: Payer: Self-pay

## 2020-11-02 DIAGNOSIS — Z9884 Bariatric surgery status: Secondary | ICD-10-CM | POA: Diagnosis not present

## 2020-11-02 DIAGNOSIS — M8589 Other specified disorders of bone density and structure, multiple sites: Secondary | ICD-10-CM | POA: Diagnosis not present

## 2020-11-02 DIAGNOSIS — N159 Renal tubulo-interstitial disease, unspecified: Secondary | ICD-10-CM | POA: Insufficient documentation

## 2020-11-02 DIAGNOSIS — Z8659 Personal history of other mental and behavioral disorders: Secondary | ICD-10-CM | POA: Diagnosis not present

## 2020-11-05 DIAGNOSIS — R3911 Hesitancy of micturition: Secondary | ICD-10-CM | POA: Diagnosis not present

## 2020-11-05 DIAGNOSIS — N3943 Post-void dribbling: Secondary | ICD-10-CM | POA: Diagnosis not present

## 2020-11-05 DIAGNOSIS — M6281 Muscle weakness (generalized): Secondary | ICD-10-CM | POA: Diagnosis not present

## 2020-11-05 DIAGNOSIS — R208 Other disturbances of skin sensation: Secondary | ICD-10-CM | POA: Diagnosis not present

## 2020-11-05 DIAGNOSIS — M62838 Other muscle spasm: Secondary | ICD-10-CM | POA: Diagnosis not present

## 2020-11-08 DIAGNOSIS — F411 Generalized anxiety disorder: Secondary | ICD-10-CM | POA: Diagnosis not present

## 2020-11-08 DIAGNOSIS — F331 Major depressive disorder, recurrent, moderate: Secondary | ICD-10-CM | POA: Diagnosis not present

## 2020-11-08 DIAGNOSIS — F5001 Anorexia nervosa, restricting type: Secondary | ICD-10-CM | POA: Diagnosis not present

## 2020-11-13 DIAGNOSIS — M461 Sacroiliitis, not elsewhere classified: Secondary | ICD-10-CM | POA: Diagnosis not present

## 2020-11-13 DIAGNOSIS — G8929 Other chronic pain: Secondary | ICD-10-CM | POA: Diagnosis not present

## 2020-11-13 DIAGNOSIS — M7918 Myalgia, other site: Secondary | ICD-10-CM | POA: Diagnosis not present

## 2020-11-13 DIAGNOSIS — M7062 Trochanteric bursitis, left hip: Secondary | ICD-10-CM | POA: Diagnosis not present

## 2020-11-13 DIAGNOSIS — G894 Chronic pain syndrome: Secondary | ICD-10-CM | POA: Diagnosis not present

## 2020-11-13 DIAGNOSIS — M533 Sacrococcygeal disorders, not elsewhere classified: Secondary | ICD-10-CM | POA: Diagnosis not present

## 2020-11-20 ENCOUNTER — Other Ambulatory Visit: Payer: Self-pay

## 2020-11-20 ENCOUNTER — Inpatient Hospital Stay (HOSPITAL_COMMUNITY): Payer: PPO | Attending: Hematology

## 2020-11-20 DIAGNOSIS — Z79899 Other long term (current) drug therapy: Secondary | ICD-10-CM | POA: Insufficient documentation

## 2020-11-20 DIAGNOSIS — K760 Fatty (change of) liver, not elsewhere classified: Secondary | ICD-10-CM | POA: Insufficient documentation

## 2020-11-20 DIAGNOSIS — Z9884 Bariatric surgery status: Secondary | ICD-10-CM | POA: Insufficient documentation

## 2020-11-20 DIAGNOSIS — E538 Deficiency of other specified B group vitamins: Secondary | ICD-10-CM | POA: Diagnosis not present

## 2020-11-20 DIAGNOSIS — D509 Iron deficiency anemia, unspecified: Secondary | ICD-10-CM

## 2020-11-20 DIAGNOSIS — D7589 Other specified diseases of blood and blood-forming organs: Secondary | ICD-10-CM

## 2020-11-20 LAB — CBC WITH DIFFERENTIAL/PLATELET
Abs Immature Granulocytes: 0.03 10*3/uL (ref 0.00–0.07)
Basophils Absolute: 0.1 10*3/uL (ref 0.0–0.1)
Basophils Relative: 1 %
Eosinophils Absolute: 0.5 10*3/uL (ref 0.0–0.5)
Eosinophils Relative: 6 %
HCT: 42.7 % (ref 36.0–46.0)
Hemoglobin: 14.1 g/dL (ref 12.0–15.0)
Immature Granulocytes: 0 %
Lymphocytes Relative: 21 %
Lymphs Abs: 1.7 10*3/uL (ref 0.7–4.0)
MCH: 33.8 pg (ref 26.0–34.0)
MCHC: 33 g/dL (ref 30.0–36.0)
MCV: 102.4 fL — ABNORMAL HIGH (ref 80.0–100.0)
Monocytes Absolute: 0.4 10*3/uL (ref 0.1–1.0)
Monocytes Relative: 5 %
Neutro Abs: 5.2 10*3/uL (ref 1.7–7.7)
Neutrophils Relative %: 67 %
Platelets: 267 10*3/uL (ref 150–400)
RBC: 4.17 MIL/uL (ref 3.87–5.11)
RDW: 12.9 % (ref 11.5–15.5)
WBC: 7.8 10*3/uL (ref 4.0–10.5)
nRBC: 0 % (ref 0.0–0.2)

## 2020-11-20 LAB — IRON AND TIBC
Iron: 39 ug/dL (ref 28–170)
Saturation Ratios: 13 % (ref 10.4–31.8)
TIBC: 294 ug/dL (ref 250–450)
UIBC: 255 ug/dL

## 2020-11-20 LAB — C-REACTIVE PROTEIN: CRP: 0.8 mg/dL (ref <1.0)

## 2020-11-20 LAB — SEDIMENTATION RATE: Sed Rate: 2 mm/hr (ref 0–22)

## 2020-11-20 LAB — FOLATE: Folate: 35.4 ng/mL (ref >5.9)

## 2020-11-20 LAB — VITAMIN B12: Vitamin B-12: 2695 pg/mL — ABNORMAL HIGH (ref 180–914)

## 2020-11-20 LAB — FERRITIN: Ferritin: 111 ng/mL (ref 11–307)

## 2020-11-20 LAB — TSH: TSH: 1.693 u[IU]/mL (ref 0.350–4.500)

## 2020-11-20 LAB — LACTATE DEHYDROGENASE: LDH: 159 U/L (ref 98–192)

## 2020-11-21 ENCOUNTER — Other Ambulatory Visit (HOSPITAL_COMMUNITY): Payer: PPO

## 2020-11-21 DIAGNOSIS — R3911 Hesitancy of micturition: Secondary | ICD-10-CM | POA: Diagnosis not present

## 2020-11-21 DIAGNOSIS — N3943 Post-void dribbling: Secondary | ICD-10-CM | POA: Diagnosis not present

## 2020-11-21 DIAGNOSIS — R208 Other disturbances of skin sensation: Secondary | ICD-10-CM | POA: Diagnosis not present

## 2020-11-21 DIAGNOSIS — M6281 Muscle weakness (generalized): Secondary | ICD-10-CM | POA: Diagnosis not present

## 2020-11-21 DIAGNOSIS — M62838 Other muscle spasm: Secondary | ICD-10-CM | POA: Diagnosis not present

## 2020-11-21 LAB — COPPER, SERUM: Copper: 135 ug/dL (ref 80–158)

## 2020-11-22 DIAGNOSIS — Z09 Encounter for follow-up examination after completed treatment for conditions other than malignant neoplasm: Secondary | ICD-10-CM | POA: Diagnosis not present

## 2020-11-22 DIAGNOSIS — M109 Gout, unspecified: Secondary | ICD-10-CM | POA: Diagnosis not present

## 2020-11-22 LAB — PROTEIN ELECTROPHORESIS, SERUM
A/G Ratio: 1.6 (ref 0.7–1.7)
Albumin ELP: 4 g/dL (ref 2.9–4.4)
Alpha-1-Globulin: 0.2 g/dL (ref 0.0–0.4)
Alpha-2-Globulin: 0.7 g/dL (ref 0.4–1.0)
Beta Globulin: 0.8 g/dL (ref 0.7–1.3)
Gamma Globulin: 0.8 g/dL (ref 0.4–1.8)
Globulin, Total: 2.5 g/dL (ref 2.2–3.9)
Total Protein ELP: 6.5 g/dL (ref 6.0–8.5)

## 2020-11-23 DIAGNOSIS — M109 Gout, unspecified: Secondary | ICD-10-CM | POA: Diagnosis not present

## 2020-11-23 DIAGNOSIS — F5001 Anorexia nervosa, restricting type: Secondary | ICD-10-CM | POA: Diagnosis not present

## 2020-11-23 DIAGNOSIS — F331 Major depressive disorder, recurrent, moderate: Secondary | ICD-10-CM | POA: Diagnosis not present

## 2020-11-23 DIAGNOSIS — F411 Generalized anxiety disorder: Secondary | ICD-10-CM | POA: Diagnosis not present

## 2020-11-23 LAB — METHYLMALONIC ACID, SERUM: Methylmalonic Acid, Quantitative: 167 nmol/L (ref 0–378)

## 2020-11-27 NOTE — Progress Notes (Signed)
Virtual Visit via Telephone Note [facility]  I connected with Katelyn [name]  on 11/28/20  at 8:59 AM by telephone and verified that I am speaking with the correct person using two identifiers.  Location: Patient: Home Provider: [facility]   I discussed the limitations, risks, security and privacy concerns of performing an evaluation and management service by telephone and the availability of in person appointments. I also discussed with the patient that there may be a patient responsible charge related to this service. The patient expressed understanding and agreed to proceed.   History of Present Illness: [name] follows at our clinic for iron deficiency and B12 deficiency secondary to malabsorption from gastric bypass surgery and chronic PPI therapy.  She was last evaluated on 08/22/2020 by Dr. Delton Coombes, via telephone visit.  At today's visit, she reports feeling fair.  She denies any recent hospitalizations or changes in her baseline health status.  She reports chronic ongoing fatigue, that is unchanged from baseline.  She denies any epistaxis, hematemesis, hematochezia, melena, or other abnormal blood loss.  She does have heavy menstrual periods that last for about 3 days, but with heavy blood loss and clotting.  She denies any chest pain, palpitations, dyspnea, or syncope.  She reports that she has "chronic pain all over."  She is not currently taking any dietary vitamin or mineral supplements.  She has an upcoming surgery on 12/17/2020 to remove bilateral breast implants.  She reports 40% energy and 100% appetite.  She is eating well to maintain a stable weight at this time.    Observations/Objective: Review of Systems  Constitutional:  Positive for malaise/fatigue. Negative for chills, diaphoresis, fever and weight loss.  Respiratory:  Negative for cough and shortness of breath.   Cardiovascular:  Negative for chest pain  and palpitations.  Gastrointestinal:  Positive for constipation. Negative for abdominal pain, blood in stool, melena, nausea and vomiting.  Musculoskeletal:  Positive for joint pain and myalgias.  Neurological:  Positive for dizziness, tingling and headaches.  Psychiatric/Behavioral:  Positive for depression. The patient is nervous/anxious and has insomnia.     PHYSICAL EXAM (per limitations of virtual telephone visit): The patient is alert and oriented x 3, exhibiting adequate mentation, good mood, and ability to speak in full sentences and execute sound judgement.   ASSESSMENT & PLAN: 1.  Iron deficiency state: - Secondary to malabsorption in the setting of prior gastric bypass surgery and chronic PPI use. - Patient denies any signs or symptoms of abnormal blood loss.  Menses are regular, last 3 days, but with heavy blood loss and clots per patient report. Christeen Douglas on 01/28/2019 and 05/04/2020 - patient had a reaction after her Injectafer treatment (fever, headaches, general malaise), despite being premedicated with Benadryl, Tylenol, and Solu-Medrol.  She had previous reaction to [facility] and has multiple medication allergies. -Most recent labs (11/20/2020): Hgb 14.1, ferritin 111, iron saturation 13% with normal TIBC 294 - PLAN: No indication for IV iron at this time.  We will recheck in 3 months.  She will need a different parenteral iron preparation (NOT Injectafer or Feraheme), will consider Venofer with premedication if/when she needs IV iron in the future.  2.  Macrocytosis: - She has persistent macrocytosis for the past few times.  Most recent CBC (11/20/2020) with MCV 102.4. - She had history of fatty infiltration of liver prior to her gastric bypass surgery (per abdominal US on 07/13/2009).  No subsequent imaging after surgery  in 2015. - Most recent labs (11/20/2020): B12 elevated at 2695, folate normal, LDH normal, ESR and CRP normal, copper normal, methylmalonic acid normal, SPEP  unremarkable, TSH normal - B12 level was elevated at 3325.  Recent LFTs were normal (10/14/2020). - Most recent abdominal imaging with CT abdomen/renal stone study (10/14/2020): Unremarkable unenhanced appearance of liver, no focal lesion identified; spleen normal in size without focal abnormality - PLAN: Suspect macrocytosis possibly due to fatty liver disease.  In the absence of other hematologic abnormalities, we will continue to watch and wait at this time.   3.  Elevated B12 levels: - She had a persistently elevated B12 level since at least June of 2021 - She is not on any B12 supplements at this time. - Most recent labs (11/20/2020) showed B12 elevated at 2695, normal LDH, ESR, and CRP - Unclear etiology, I have explained to her that this could be acute phase reactant.  - PLAN: We will continue to watch and wait.  No immediate cause for concern from a hematology/oncology standpoint, patient may benefit from additional work-up by other specialists in this regard.   Follow Up Instructions: - Labs and RTC in 3 months (phone visit).    I discussed the assessment and treatment plan with the patient. The patient was provided an opportunity to ask questions and all were answered. The patient agreed with the plan and demonstrated an understanding of the instructions.   The patient was advised to call back or seek an in-person evaluation if the symptoms worsen or if the condition fails to improve as anticipated.  I provided 22 minutes of non-face-to-face time during this encounter.   Harriett Rush, PA-C 11/28/2020 9:42 AM

## 2020-11-28 ENCOUNTER — Ambulatory Visit (HOSPITAL_COMMUNITY): Payer: PPO | Admitting: Hematology

## 2020-11-28 ENCOUNTER — Inpatient Hospital Stay (HOSPITAL_COMMUNITY): Payer: PPO | Attending: Hematology | Admitting: Physician Assistant

## 2020-11-28 ENCOUNTER — Other Ambulatory Visit: Payer: Self-pay

## 2020-11-28 DIAGNOSIS — R7989 Other specified abnormal findings of blood chemistry: Secondary | ICD-10-CM

## 2020-11-28 DIAGNOSIS — D7589 Other specified diseases of blood and blood-forming organs: Secondary | ICD-10-CM | POA: Diagnosis not present

## 2020-11-28 DIAGNOSIS — D509 Iron deficiency anemia, unspecified: Secondary | ICD-10-CM | POA: Diagnosis not present

## 2020-11-29 DIAGNOSIS — F411 Generalized anxiety disorder: Secondary | ICD-10-CM | POA: Diagnosis not present

## 2020-11-29 DIAGNOSIS — M6281 Muscle weakness (generalized): Secondary | ICD-10-CM | POA: Diagnosis not present

## 2020-11-29 DIAGNOSIS — F331 Major depressive disorder, recurrent, moderate: Secondary | ICD-10-CM | POA: Diagnosis not present

## 2020-11-29 DIAGNOSIS — R208 Other disturbances of skin sensation: Secondary | ICD-10-CM | POA: Diagnosis not present

## 2020-11-29 DIAGNOSIS — N3943 Post-void dribbling: Secondary | ICD-10-CM | POA: Diagnosis not present

## 2020-11-29 DIAGNOSIS — M62838 Other muscle spasm: Secondary | ICD-10-CM | POA: Diagnosis not present

## 2020-11-29 DIAGNOSIS — R3911 Hesitancy of micturition: Secondary | ICD-10-CM | POA: Diagnosis not present

## 2020-11-29 DIAGNOSIS — F5001 Anorexia nervosa, restricting type: Secondary | ICD-10-CM | POA: Diagnosis not present

## 2020-11-30 DIAGNOSIS — R300 Dysuria: Secondary | ICD-10-CM | POA: Diagnosis not present

## 2020-11-30 DIAGNOSIS — B373 Candidiasis of vulva and vagina: Secondary | ICD-10-CM | POA: Diagnosis not present

## 2020-11-30 DIAGNOSIS — N898 Other specified noninflammatory disorders of vagina: Secondary | ICD-10-CM | POA: Diagnosis not present

## 2020-12-04 DIAGNOSIS — M62838 Other muscle spasm: Secondary | ICD-10-CM | POA: Diagnosis not present

## 2020-12-04 DIAGNOSIS — R208 Other disturbances of skin sensation: Secondary | ICD-10-CM | POA: Diagnosis not present

## 2020-12-04 DIAGNOSIS — M6281 Muscle weakness (generalized): Secondary | ICD-10-CM | POA: Diagnosis not present

## 2020-12-04 DIAGNOSIS — N3943 Post-void dribbling: Secondary | ICD-10-CM | POA: Diagnosis not present

## 2020-12-04 DIAGNOSIS — R3911 Hesitancy of micturition: Secondary | ICD-10-CM | POA: Diagnosis not present

## 2020-12-07 DIAGNOSIS — F5001 Anorexia nervosa, restricting type: Secondary | ICD-10-CM | POA: Diagnosis not present

## 2020-12-07 DIAGNOSIS — F411 Generalized anxiety disorder: Secondary | ICD-10-CM | POA: Diagnosis not present

## 2020-12-07 DIAGNOSIS — F331 Major depressive disorder, recurrent, moderate: Secondary | ICD-10-CM | POA: Diagnosis not present

## 2020-12-11 ENCOUNTER — Telehealth (INDEPENDENT_AMBULATORY_CARE_PROVIDER_SITE_OTHER): Payer: PPO | Admitting: Psychiatry

## 2020-12-11 ENCOUNTER — Encounter (HOSPITAL_COMMUNITY): Payer: Self-pay | Admitting: Psychiatry

## 2020-12-11 ENCOUNTER — Other Ambulatory Visit: Payer: Self-pay

## 2020-12-11 DIAGNOSIS — F509 Eating disorder, unspecified: Secondary | ICD-10-CM

## 2020-12-11 DIAGNOSIS — F411 Generalized anxiety disorder: Secondary | ICD-10-CM

## 2020-12-11 DIAGNOSIS — F331 Major depressive disorder, recurrent, moderate: Secondary | ICD-10-CM | POA: Diagnosis not present

## 2020-12-11 MED ORDER — BUPROPION HCL 100 MG PO TABS: ORAL_TABLET | ORAL | 1 refills | Status: DC

## 2020-12-11 MED ORDER — TEMAZEPAM 30 MG PO CAPS: 30.0000 mg | ORAL_CAPSULE | Freq: Every day | ORAL | 1 refills | Status: DC

## 2020-12-11 MED ORDER — ZIPRASIDONE HCL 80 MG PO CAPS: 80.0000 mg | ORAL_CAPSULE | Freq: Every day | ORAL | 1 refills | Status: DC

## 2020-12-11 MED ORDER — CLONAZEPAM 0.5 MG PO TABS: 0.5000 mg | ORAL_TABLET | Freq: Three times a day (TID) | ORAL | 1 refills | Status: DC | PRN

## 2020-12-11 NOTE — Progress Notes (Signed)
Virtual Visit via Telephone Note  I connected with Katelyn [name] on 12/11/20 at 11:00 AM EDT by telephone and verified that I am speaking with the correct person using two identifiers.  Location: Patient: Home Provider: Home Office   I discussed the limitations, risks, security and privacy concerns of performing an evaluation and management service by telephone and the availability of in person appointments. I also discussed with the patient that there may be a patient responsible charge related to this service. The patient expressed understanding and agreed to proceed.   History of Present Illness: Patient is evaluated by phone session.  She endorsed increased anxiety and nervousness has she had surgery coming up this Monday.  She is not sure if the increase Wellbutrin are upcoming surgery making her more anxious.  She is sleeping 4 to 5 hours.  We had increased the Wellbutrin from '175mg'$  to 200 mg.  She is taking first pill at 6 AM and second at 9 AM.  She is in therapy with [name] every week.  Patient is hoping after removal of breast implant her anxiety, neuropathy get better.  She also afraid that she may not get enough pain medicine after the surgery.  She denies any hallucination, suicidal thoughts, paranoia or any crying spells but admitted increased anxiety and nervousness.  She is taking Klonopin 0.5 mg 3 times a day.  She is not taking Vyvanse.  Patient is not purging and her appetite weight is same and is stable.  She denies any impulsive behavior.  Past Psychiatric History:  H/O multiple inpatient. Last admission Feb 2015.  Good response with ECT but scared to continue maintenance ECT.  Tried Cymbalta (suicidal thoughts), Lexapro, Prozac (sexual side effects), Abilify, lithium, Lamictal, Ritalin, Remeron, Risperdal, Neurontin, Vistaril, BuSpar and Valium.  Psychiatric Specialty Exam: Physical Exam  Review of Systems  Weight 180 lb (81.6 kg).There is no height or weight  on file to calculate BMI.  General Appearance: NA  Eye Contact:  NA  Speech:  Slow  Volume:  Decreased  Mood:  Anxious  Affect:  NA  Thought Process:  Descriptions of Associations: Intact  Orientation:  Full (Time, Place, and Person)  Thought Content:  Rumination  Suicidal Thoughts:  No  Homicidal Thoughts:  No  Memory:  Immediate;   Good Recent;   Fair Remote;   Fair  Judgement:  Fair  Insight:  Shallow  Psychomotor Activity:  NA  Concentration:  Concentration: Fair and Attention Span: Fair  Recall:  [employer] of Knowledge:  Good  Language:  Good  Akathisia:  No  Handed:  Right  AIMS (if indicated):     Assets:  Communication Skills Desire for Improvement Housing Social Support  ADL's:  Intact  Cognition:  WNL  Sleep:   fair      Assessment and Plan: Eating disorder, unspecified type.  Major depressive disorder, recurrent.  Generalized anxiety disorder.  I reviewed current medication.  Reassurance given.  Patient has upcoming surgery on Monday to remove breast implant and hoping it will help her chronic neuropathy.  Encourage to see therapist and recommend not to take the medication however after the surgery if she is still feeling nervous and anxious then we will do medication adjustment.  We have discussed in the past about trying Trintellix if current regimen did not work.  Follow-up in 6 weeks.  Follow Up Instructions:    I discussed the assessment and treatment plan with the patient. The patient was provided  an opportunity to ask questions and all were answered. The patient agreed with the plan and demonstrated an understanding of the instructions.   The patient was advised to call back or seek an in-person evaluation if the symptoms worsen or if the condition fails to improve as anticipated.  I provided 16 minutes of non-face-to-face time during this encounter.   Kathlee Nations, MD

## 2020-12-13 DIAGNOSIS — F5001 Anorexia nervosa, restricting type: Secondary | ICD-10-CM | POA: Diagnosis not present

## 2020-12-13 DIAGNOSIS — F411 Generalized anxiety disorder: Secondary | ICD-10-CM | POA: Diagnosis not present

## 2020-12-13 DIAGNOSIS — F331 Major depressive disorder, recurrent, moderate: Secondary | ICD-10-CM | POA: Diagnosis not present

## 2020-12-13 HISTORY — PX: BREAST CAPSULECTOMY: SHX906

## 2020-12-14 ENCOUNTER — Telehealth (HOSPITAL_COMMUNITY): Payer: PPO | Admitting: Psychiatry

## 2020-12-20 DIAGNOSIS — F5001 Anorexia nervosa, restricting type: Secondary | ICD-10-CM | POA: Diagnosis not present

## 2020-12-20 DIAGNOSIS — F331 Major depressive disorder, recurrent, moderate: Secondary | ICD-10-CM | POA: Diagnosis not present

## 2020-12-20 DIAGNOSIS — F411 Generalized anxiety disorder: Secondary | ICD-10-CM | POA: Diagnosis not present

## 2020-12-27 DIAGNOSIS — F411 Generalized anxiety disorder: Secondary | ICD-10-CM | POA: Diagnosis not present

## 2020-12-27 DIAGNOSIS — F331 Major depressive disorder, recurrent, moderate: Secondary | ICD-10-CM | POA: Diagnosis not present

## 2020-12-27 DIAGNOSIS — F5001 Anorexia nervosa, restricting type: Secondary | ICD-10-CM | POA: Diagnosis not present

## 2020-12-28 ENCOUNTER — Encounter (HOSPITAL_COMMUNITY): Payer: Self-pay

## 2020-12-28 ENCOUNTER — Other Ambulatory Visit (HOSPITAL_COMMUNITY): Payer: Self-pay | Admitting: *Deleted

## 2020-12-28 DIAGNOSIS — D509 Iron deficiency anemia, unspecified: Secondary | ICD-10-CM

## 2020-12-28 NOTE — Progress Notes (Signed)
Patient sent a mychart message stating:   "I have had major surgery with complications resulting in large hematoma and a second surgery.  I've lost a lot of blood.  My surgeon said to let you know I may need labs and iron infusion.  I'm very weak, pale and short of breath with walking.  Do you want to do labs at this time? "    Patient advised to come next week for labs, orders placed.  Our schedulers will call her with appt date and time specifics.

## 2021-01-02 ENCOUNTER — Other Ambulatory Visit: Payer: Self-pay

## 2021-01-02 ENCOUNTER — Inpatient Hospital Stay (HOSPITAL_COMMUNITY): Payer: PPO | Attending: Hematology

## 2021-01-02 DIAGNOSIS — Z9884 Bariatric surgery status: Secondary | ICD-10-CM | POA: Insufficient documentation

## 2021-01-02 DIAGNOSIS — K909 Intestinal malabsorption, unspecified: Secondary | ICD-10-CM | POA: Diagnosis not present

## 2021-01-02 DIAGNOSIS — D509 Iron deficiency anemia, unspecified: Secondary | ICD-10-CM

## 2021-01-02 DIAGNOSIS — D508 Other iron deficiency anemias: Secondary | ICD-10-CM | POA: Insufficient documentation

## 2021-01-02 LAB — CBC WITH DIFFERENTIAL/PLATELET
Abs Immature Granulocytes: 0.05 10*3/uL (ref 0.00–0.07)
Basophils Absolute: 0.1 10*3/uL (ref 0.0–0.1)
Basophils Relative: 1 %
Eosinophils Absolute: 0.2 10*3/uL (ref 0.0–0.5)
Eosinophils Relative: 2 %
HCT: 36 % (ref 36.0–46.0)
Hemoglobin: 11.6 g/dL — ABNORMAL LOW (ref 12.0–15.0)
Immature Granulocytes: 1 %
Lymphocytes Relative: 21 %
Lymphs Abs: 1.9 10*3/uL (ref 0.7–4.0)
MCH: 33.3 pg (ref 26.0–34.0)
MCHC: 32.2 g/dL (ref 30.0–36.0)
MCV: 103.4 fL — ABNORMAL HIGH (ref 80.0–100.0)
Monocytes Absolute: 0.5 10*3/uL (ref 0.1–1.0)
Monocytes Relative: 6 %
Neutro Abs: 6.2 10*3/uL (ref 1.7–7.7)
Neutrophils Relative %: 69 %
Platelets: 465 10*3/uL — ABNORMAL HIGH (ref 150–400)
RBC: 3.48 MIL/uL — ABNORMAL LOW (ref 3.87–5.11)
RDW: 12.7 % (ref 11.5–15.5)
WBC: 8.9 10*3/uL (ref 4.0–10.5)
nRBC: 0 % (ref 0.0–0.2)

## 2021-01-02 LAB — IRON AND TIBC
Iron: 43 ug/dL (ref 28–170)
Saturation Ratios: 15 % (ref 10.4–31.8)
TIBC: 280 ug/dL (ref 250–450)
UIBC: 237 ug/dL

## 2021-01-02 LAB — SAMPLE TO BLOOD BANK

## 2021-01-02 LAB — FERRITIN: Ferritin: 72 ng/mL (ref 11–307)

## 2021-01-04 DIAGNOSIS — F331 Major depressive disorder, recurrent, moderate: Secondary | ICD-10-CM | POA: Diagnosis not present

## 2021-01-04 DIAGNOSIS — F411 Generalized anxiety disorder: Secondary | ICD-10-CM | POA: Diagnosis not present

## 2021-01-04 DIAGNOSIS — F5001 Anorexia nervosa, restricting type: Secondary | ICD-10-CM | POA: Diagnosis not present

## 2021-01-08 DIAGNOSIS — R208 Other disturbances of skin sensation: Secondary | ICD-10-CM | POA: Diagnosis not present

## 2021-01-08 DIAGNOSIS — N3943 Post-void dribbling: Secondary | ICD-10-CM | POA: Diagnosis not present

## 2021-01-08 DIAGNOSIS — M62838 Other muscle spasm: Secondary | ICD-10-CM | POA: Diagnosis not present

## 2021-01-08 DIAGNOSIS — M6281 Muscle weakness (generalized): Secondary | ICD-10-CM | POA: Diagnosis not present

## 2021-01-08 DIAGNOSIS — R3911 Hesitancy of micturition: Secondary | ICD-10-CM | POA: Diagnosis not present

## 2021-01-10 DIAGNOSIS — F5001 Anorexia nervosa, restricting type: Secondary | ICD-10-CM | POA: Diagnosis not present

## 2021-01-10 DIAGNOSIS — F411 Generalized anxiety disorder: Secondary | ICD-10-CM | POA: Diagnosis not present

## 2021-01-10 DIAGNOSIS — F331 Major depressive disorder, recurrent, moderate: Secondary | ICD-10-CM | POA: Diagnosis not present

## 2021-01-15 DIAGNOSIS — N3943 Post-void dribbling: Secondary | ICD-10-CM | POA: Diagnosis not present

## 2021-01-15 DIAGNOSIS — R208 Other disturbances of skin sensation: Secondary | ICD-10-CM | POA: Diagnosis not present

## 2021-01-15 DIAGNOSIS — M62838 Other muscle spasm: Secondary | ICD-10-CM | POA: Diagnosis not present

## 2021-01-15 DIAGNOSIS — M6281 Muscle weakness (generalized): Secondary | ICD-10-CM | POA: Diagnosis not present

## 2021-01-15 DIAGNOSIS — R3911 Hesitancy of micturition: Secondary | ICD-10-CM | POA: Diagnosis not present

## 2021-01-17 ENCOUNTER — Telehealth (HOSPITAL_COMMUNITY): Payer: Self-pay | Admitting: *Deleted

## 2021-01-17 DIAGNOSIS — F331 Major depressive disorder, recurrent, moderate: Secondary | ICD-10-CM | POA: Diagnosis not present

## 2021-01-17 DIAGNOSIS — F411 Generalized anxiety disorder: Secondary | ICD-10-CM | POA: Diagnosis not present

## 2021-01-17 DIAGNOSIS — F5001 Anorexia nervosa, restricting type: Secondary | ICD-10-CM | POA: Diagnosis not present

## 2021-01-17 NOTE — Telephone Encounter (Signed)
Pt called requesting Ambien for sleep. Pt has an appointment on 01/28/21 and asked if she may get enough until she is seen then. Pt has had recent surgeries and states she is only 2-3 hours a night due to pain and having drains in place. Pt sounds very anxious about sleep pattern. Pt states her mother takes Ambien and it has always worked well for her. Please review and advise.

## 2021-01-17 NOTE — Telephone Encounter (Signed)
She is already taking temazepam and Klonopin.  Cannot provide a third controlled substance.  If she agree to stop the temazepam then we can prescribe Ambien 10 mg.

## 2021-01-18 ENCOUNTER — Other Ambulatory Visit (HOSPITAL_COMMUNITY): Payer: Self-pay

## 2021-01-18 MED ORDER — ZOLPIDEM TARTRATE 10 MG PO TABS: 10.0000 mg | ORAL_TABLET | Freq: Every evening | ORAL | 0 refills | Status: DC | PRN

## 2021-01-22 ENCOUNTER — Telehealth (HOSPITAL_COMMUNITY): Payer: PPO | Admitting: Psychiatry

## 2021-01-25 DIAGNOSIS — F5001 Anorexia nervosa, restricting type: Secondary | ICD-10-CM | POA: Diagnosis not present

## 2021-01-25 DIAGNOSIS — F411 Generalized anxiety disorder: Secondary | ICD-10-CM | POA: Diagnosis not present

## 2021-01-25 DIAGNOSIS — F331 Major depressive disorder, recurrent, moderate: Secondary | ICD-10-CM | POA: Diagnosis not present

## 2021-01-28 ENCOUNTER — Other Ambulatory Visit: Payer: Self-pay

## 2021-01-28 ENCOUNTER — Telehealth (HOSPITAL_BASED_OUTPATIENT_CLINIC_OR_DEPARTMENT_OTHER): Payer: PPO | Admitting: Psychiatry

## 2021-01-28 ENCOUNTER — Encounter (HOSPITAL_COMMUNITY): Payer: Self-pay | Admitting: Psychiatry

## 2021-01-28 DIAGNOSIS — F411 Generalized anxiety disorder: Secondary | ICD-10-CM | POA: Diagnosis not present

## 2021-01-28 DIAGNOSIS — F5101 Primary insomnia: Secondary | ICD-10-CM

## 2021-01-28 DIAGNOSIS — F331 Major depressive disorder, recurrent, moderate: Secondary | ICD-10-CM

## 2021-01-28 DIAGNOSIS — F509 Eating disorder, unspecified: Secondary | ICD-10-CM

## 2021-01-28 MED ORDER — TEMAZEPAM 30 MG PO CAPS: 30.0000 mg | ORAL_CAPSULE | Freq: Every day | ORAL | 1 refills | Status: DC

## 2021-01-28 MED ORDER — BUPROPION HCL 75 MG PO TABS: ORAL_TABLET | ORAL | 1 refills | Status: DC

## 2021-01-28 MED ORDER — ZIPRASIDONE HCL 80 MG PO CAPS: 80.0000 mg | ORAL_CAPSULE | Freq: Every day | ORAL | 1 refills | Status: DC

## 2021-01-28 MED ORDER — CLONAZEPAM 0.5 MG PO TABS: 0.5000 mg | ORAL_TABLET | Freq: Three times a day (TID) | ORAL | 1 refills | Status: DC | PRN

## 2021-01-28 NOTE — Progress Notes (Signed)
Virtual Visit via Telephone Note  I connected with Katelyn [name] on 01/28/21 at  9:00 AM EDT by telephone and verified that I am speaking with the correct person using two identifiers.  Location: Patient: home Provider: Home Office   I discussed the limitations, risks, security and privacy concerns of performing an evaluation and management service by telephone and the availability of in person appointments. I also discussed with the patient that there may be a patient responsible charge related to this service. The patient expressed understanding and agreed to proceed.   History of Present Illness: Patient is evaluated by phone session.  She had breast implant removal procedure on August 21 and then August 29.  She had a hard time with recovery and admitted having insomnia due to pain and chronic anxiety.  She requested to try Ambien which she did for a few days but did not feel significant improvement and not like to go back on temazepam.  She is in physical therapy and her mother takes her to the appointments.  She feels her depression anxiety is chronic but stable.  She like to go back on her previous medication including the Wellbutrin that she was taking 75 mg 2 tablets every day.  She has no tremor or shakes or any EPS.  She is not engaged in any self abusive behavior and her eating disorder is also stable.  She is not purging, self-induced vomiting and restricting her diet.  She is slowly and gradually coming off from pain medication.  She reported procedure was complicated and recently she had a drains out and slowly and gradually they are taking sutures out.  She had a lot of scar tissue in the breast and that may be causing chronic pain.  She has hematoma and complications.  She is hoping since implant is removed her chronic neuropathy may get better.  She is still taking gabapentin 1800 mg a day along with hydrocodone and other muscle relaxant.  She denies any major panic attack.   She is not taking Vyvanse at this time.  She is in therapy with [name] and also seeing nutritionist Lynden Ang.  She denies any paranoia, hallucination or any suicidal thoughts.  Past Psychiatric History:  H/O multiple inpatient. Last admission Feb 2015.  Good response with ECT but scared to continue maintenance ECT.  Tried Cymbalta (suicidal thoughts), Lexapro, Prozac (sexual side effects), Abilify, lithium, Lamictal, Ritalin, Remeron, Risperdal, Neurontin, Vistaril, BuSpar and Valium.  Psychiatric Specialty Exam: Physical Exam  Review of Systems  There were no vitals taken for this visit.There is no height or weight on file to calculate BMI.  General Appearance: NA  Eye Contact:  NA  Speech:  Slow  Volume:  Decreased  Mood:  Anxious and Dysphoric  Affect:  NA  Thought Process:  Descriptions of Associations: Intact  Orientation:  Full (Time, Place, and Person)  Thought Content:  Rumination  Suicidal Thoughts:  No  Homicidal Thoughts:  No  Memory:  Immediate;   Good Recent;   Fair Remote;   Fair  Judgement:  Fair  Insight:  Present  Psychomotor Activity:  NA  Concentration:  Concentration: Fair and Attention Span: Fair  Recall:  Good  Fund of Knowledge:  Good  Language:  Fair  Akathisia:  No  Handed:  Right  AIMS (if indicated):     Assets:  Communication Skills Desire for Improvement Housing Social Support  ADL's:  Intact  Cognition:  WNL  Sleep:   fair  Assessment and Plan: Primary insomnia.  Eating disorder, unspecified type.  Major depressive disorder, recurrent.  Generalized anxiety disorder.  Patient is recovering from her breast implant removal procedure slowly and gradually.  She still has scar tissues and surgeon is taking sutures slowly.  She tried Ambien that did not work and like to go back on temazepam 30 mg that had helped her better than Ambien.  She also like to cut down her Wellbutrin back to 150.  Discussed medication side effects and  benefits.  Patient does not want to try a new medication.  She is hoping her chronic neuropathy get better as breast implant is removed.  Reassurance given.  We will continue Geodon 80 mg at bedtime, Klonopin 0.5 mg 3 times a day, reduce Wellbutrin 75 mg to take 2 tablet a day and restart temazepam 30 mg at bedtime.  We discussed benzodiazepine tolerance withdrawal and dependency.  Encouraged to keep appointment with therapist and nutritionist.  Recommended to call us back if she is any question or any concern.  Follow-up in 2 months.    Follow Up Instructions:    I discussed the assessment and treatment plan with the patient. The patient was provided an opportunity to ask questions and all were answered. The patient agreed with the plan and demonstrated an understanding of the instructions.   The patient was advised to call back or seek an in-person evaluation if the symptoms worsen or if the condition fails to improve as anticipated.  I provided 25 minutes of non-face-to-face time during this encounter.   Kathlee Nations, MD

## 2021-01-29 DIAGNOSIS — M62838 Other muscle spasm: Secondary | ICD-10-CM | POA: Diagnosis not present

## 2021-01-29 DIAGNOSIS — R208 Other disturbances of skin sensation: Secondary | ICD-10-CM | POA: Diagnosis not present

## 2021-01-29 DIAGNOSIS — M6281 Muscle weakness (generalized): Secondary | ICD-10-CM | POA: Diagnosis not present

## 2021-01-29 DIAGNOSIS — R3911 Hesitancy of micturition: Secondary | ICD-10-CM | POA: Diagnosis not present

## 2021-01-29 DIAGNOSIS — N3943 Post-void dribbling: Secondary | ICD-10-CM | POA: Diagnosis not present

## 2021-01-31 ENCOUNTER — Ambulatory Visit (HOSPITAL_COMMUNITY): Payer: PPO | Admitting: Psychiatry

## 2021-02-01 DIAGNOSIS — F5001 Anorexia nervosa, restricting type: Secondary | ICD-10-CM | POA: Diagnosis not present

## 2021-02-01 DIAGNOSIS — F411 Generalized anxiety disorder: Secondary | ICD-10-CM | POA: Diagnosis not present

## 2021-02-01 DIAGNOSIS — F331 Major depressive disorder, recurrent, moderate: Secondary | ICD-10-CM | POA: Diagnosis not present

## 2021-02-14 DIAGNOSIS — M533 Sacrococcygeal disorders, not elsewhere classified: Secondary | ICD-10-CM | POA: Diagnosis not present

## 2021-02-14 DIAGNOSIS — M7062 Trochanteric bursitis, left hip: Secondary | ICD-10-CM | POA: Diagnosis not present

## 2021-02-14 DIAGNOSIS — Z5181 Encounter for therapeutic drug level monitoring: Secondary | ICD-10-CM | POA: Diagnosis not present

## 2021-02-14 DIAGNOSIS — M461 Sacroiliitis, not elsewhere classified: Secondary | ICD-10-CM | POA: Diagnosis not present

## 2021-02-14 DIAGNOSIS — G894 Chronic pain syndrome: Secondary | ICD-10-CM | POA: Diagnosis not present

## 2021-02-14 DIAGNOSIS — Z79891 Long term (current) use of opiate analgesic: Secondary | ICD-10-CM | POA: Diagnosis not present

## 2021-02-15 DIAGNOSIS — F5001 Anorexia nervosa, restricting type: Secondary | ICD-10-CM | POA: Diagnosis not present

## 2021-02-15 DIAGNOSIS — F411 Generalized anxiety disorder: Secondary | ICD-10-CM | POA: Diagnosis not present

## 2021-02-15 DIAGNOSIS — F331 Major depressive disorder, recurrent, moderate: Secondary | ICD-10-CM | POA: Diagnosis not present

## 2021-02-19 DIAGNOSIS — B3731 Acute candidiasis of vulva and vagina: Secondary | ICD-10-CM | POA: Diagnosis not present

## 2021-02-19 DIAGNOSIS — N898 Other specified noninflammatory disorders of vagina: Secondary | ICD-10-CM | POA: Diagnosis not present

## 2021-02-22 DIAGNOSIS — F411 Generalized anxiety disorder: Secondary | ICD-10-CM | POA: Diagnosis not present

## 2021-02-22 DIAGNOSIS — F331 Major depressive disorder, recurrent, moderate: Secondary | ICD-10-CM | POA: Diagnosis not present

## 2021-02-22 DIAGNOSIS — F5001 Anorexia nervosa, restricting type: Secondary | ICD-10-CM | POA: Diagnosis not present

## 2021-03-01 ENCOUNTER — Ambulatory Visit: Payer: PPO | Admitting: Pulmonary Disease

## 2021-03-01 ENCOUNTER — Other Ambulatory Visit: Payer: Self-pay

## 2021-03-01 ENCOUNTER — Encounter: Payer: Self-pay | Admitting: Pulmonary Disease

## 2021-03-01 VITALS — BP 110/80 | HR 111 | Temp 98.7°F | Ht 65.5 in

## 2021-03-01 DIAGNOSIS — J45909 Unspecified asthma, uncomplicated: Secondary | ICD-10-CM

## 2021-03-01 MED ORDER — ALBUTEROL SULFATE HFA 108 (90 BASE) MCG/ACT IN AERS: 2.0000 | INHALATION_SPRAY | Freq: Four times a day (QID) | RESPIRATORY_TRACT | 2 refills | Status: AC | PRN

## 2021-03-01 NOTE — Progress Notes (Signed)
Subjective:   PATIENT ID: Katelyn [name] GENDER: female DOB: 08-Dec-1975, MRN: [id number]   HPI  Chief Complaint  Patient presents with   Follow-up    Asthma, patient refused weight     Reason for Visit: Follow-up for dyspnea  [name] is a 45 year old female with history of tachybradycardia syndrome, depression, anxiety, migraine, PCOS and small fiber neuropathy who presents for follow-up  She was initially evaluated by me on 11/22/2018.  Since 2019 after an exposure to bug spray, she had burning sensation in her lungs.  On exposures to weed killer in the fall time, she has had recurrent burning sensation in her lungs difficulty getting air in.  Associated with change in taste and smell.  On our initial visit she reported shortness of breath and chest "burning" when exposed to certain irritants in the last year. She reports that has been a persistent burning sensation that took 4-5 months before gradually improving however this spring, this symptom recurred with increased intensity when husband was putting down fertilizer in the yard. Now any irritant including laundry detergent, shampoos, hand sanitizer, deodarants and any scented products. If she removes the irritant (washing hands, removing clothes), it will take 30-60 minutes for the intensity of the burning will subside. She describes the irritation located in the throat and deep irritation feeling like it is in the lungs. However she has a constant low-level burn. She has switched to unscented products and this has helped.  She does not thing her dyspnea is not necessarily associated with this burning sensation. She has had shortness of breath for the last year. This worsens with exertion including ambulating in the house. She believes due to her deconditioning since her breast implant in October 2018 where afterwards she had severe chronic pain from unclear etiology. She has been evaluated for [facility] and recommended  management of symptoms as work-up negative per patient.   03/20/20 Since our last visit, she decreased her Symbicort to a few times a week and tolerating well. Overall feels her symptoms are well controlled. Will be triggered by cold air, hand sanitizer, laundry detergent and chemicals/fertilizers. She has limited exposures in the household but will occasionally run into her neighbor using new chemicals on their lawn. She will have lung burning and shortness of breath usually improved with symbicort and/or albuterol. Has not had an exacerbation since our last visit. She is up to date on her vaccines and waiting on her covid booster eligibility.   03/01/21 Since August after her breast implant removal, she has been able to take deeper breaths. She thought this may be aggravated her conglomerate symptoms. Her sensitivity to smells has slightly improved. Has been tapered off of Symbicort for six months. She uses her albuterol 2-3 weeks after being exposed to a clear irritant such outdoor irritants and heat/humidity. Winter time usually improves her symptoms.  Asthma Control Test ACT Total Score  03/01/2021 22  03/20/2020 24  07/25/2019 14   Social History: Former smoker.  5-pack-year history.  Quit in [place] exposures: As above  Past Medical History:  Diagnosis Date   Anemia    Anorexia    Anxiety    Asthma    controlled with meds   Benign juvenile melanoma    Chronic headaches    Colon polyps    found on colonoscopy 61/60/7371   Complication of anesthesia    itching after epidural for c section   Constipation    Depression  Depression    several suicide attempts, hospitaluzed in 2012 for this; hx pf ECT treatments ; pt sees Dr. Adele Schilder pyschiatrist  and doing well on medication   Headache    Heart murmur    as a child - no one mentions hearing murmur anymore   Heartburn    no meds   History of pneumonia    x 3  years ago - no recent problems   Hx of blood clots     Neuromuscular disorder ([place])    small fiber neuropathy   Obesity    Pneumonia    Polycystic ovary    takes metformin to treat   Skin lesion    Excisional biopsy of moles - none cancerous   Sleep apnea    yrs ago - diagnosed mild sleep apnea - did not have to use cpap     Allergies  Allergen Reactions   Ciprofloxacin Other (See Comments)    Nerve pain  Nerve pain  Nerve pain    Duloxetine Hcl Anaphylaxis    Make her more depressed, having nausea, vomiting, headache and threw up. Make her more depressed, having nausea, vomiting, headache and threw up.   Lyrica [Pregabalin] Anaphylaxis   Feraheme [Ferumoxytol] Hives   Injectafer [Ferric Carboxymaltose] Other (See Comments)    Fevers   Propofol Other (See Comments)   Vit B12-Methionine-Inos-Chol Other (See Comments)   Penicillins     Has patient had a PCN reaction causing immediate rash, facial/tongue/throat swelling, SOB or lightheadedness with hypotension: Yes Has patient had a PCN reaction causing severe rash involving mucus membranes or skin necrosis: No Has patient had a PCN reaction that required hospitalization No Has patient had a PCN reaction occurring within the last 10 years: No If all of the above answers are "NO", then may proceed with Cephalosporin use.     REACTION: Rash     Outpatient Medications Prior to Visit  Medication Sig Dispense Refill   Ascorbic Acid (VITAMIN C) 100 MG tablet Take 100 mg by mouth daily.     buPROPion (WELLBUTRIN) 75 MG tablet Take two tablets (75 mg total dose) by mouth daily. 60 tablet 1   calcium carbonate (OS-CAL - DOSED IN MG OF ELEMENTAL CALCIUM) 1250 (500 Ca) MG tablet Take 1 tablet by mouth.     cholecalciferol (VITAMIN D3) 25 MCG (1000 UT) tablet Take 1,000 Units by mouth daily.     clonazePAM (KLONOPIN) 0.5 MG tablet Take 1 tablet (0.5 mg total) by mouth 3 (three) times daily as needed for anxiety. 75 tablet 1   fluconazole (DIFLUCAN) 150 MG tablet Take one tab by mouth now,  one in 4 days, one in 7 days 3 tablet 1   gabapentin (NEURONTIN) 300 MG capsule Take 600 mg by mouth 3 (three) times daily.     Lactobacillus Rhamnosus, GG, (CULTURELLE PO) Take by mouth.     magnesium oxide (MAG-OX) 400 MG tablet Take 400 mg by mouth daily.     Multiple Vitamin (MULTIVITAMIN WITH MINERALS) TABS tablet Take 3 tablets by mouth 2 (two) times daily.      naloxone (NARCAN) nasal spray 4 mg/0.1 mL Place into the nose as needed.      omeprazole (PRILOSEC) 20 MG capsule Take 20 mg by mouth 2 (two) times daily.     ondansetron (ZOFRAN) 4 MG tablet Take 1 tablet (4 mg total) by mouth every 8 (eight) hours as needed for nausea or vomiting. Take one tablet by mouth 4 times  weekly, as needed, for nausea 16 tablet 6   oxyCODONE-acetaminophen (PERCOCET/ROXICET) 5-325 MG tablet Take 1 tablet by mouth every 6 (six) hours.     phenazopyridine (PYRIDIUM) 200 MG tablet Take 1 tablet (200 mg total) by mouth 3 (three) times daily. 6 tablet 0   promethazine (PHENERGAN) 25 MG tablet Take 1 tablet (25 mg total) by mouth every 6 (six) hours as needed for nausea or vomiting. 15 tablet 0   temazepam (RESTORIL) 30 MG capsule Take 1 capsule (30 mg total) by mouth at bedtime. 30 capsule 1   tiZANidine (ZANAFLEX) 4 MG tablet Take 4 mg by mouth at bedtime.     ziprasidone (GEODON) 80 MG capsule Take 1 capsule (80 mg total) by mouth at bedtime. 30 capsule 1   albuterol (VENTOLIN HFA) 108 (90 Base) MCG/ACT inhaler Inhale 2 puffs into the lungs every 6 (six) hours as needed for wheezing or shortness of breath. 8 g 2   budesonide-formoterol (SYMBICORT) 80-4.5 MCG/ACT inhaler Inhale 2 puffs into the lungs 2 (two) times daily. (Patient not taking: Reported on 03/01/2021) 1 Inhaler 5   cefpodoxime (VANTIN) 200 MG tablet Take 200 mg by mouth 2 (two) times daily. (Patient not taking: Reported on 03/01/2021)     lisdexamfetamine (VYVANSE) 20 MG capsule Take 1 capsule (20 mg total) by mouth daily. (Patient not taking: No sig  reported) 30 capsule 0   miconazole (MONISTAT 7) 2 % vaginal cream Place 1 Applicatorful vaginally at bedtime. Apply once daily to affected area for 1 week , then , as needed (Patient not taking: Reported on 03/01/2021) 45 g 1   nitrofurantoin, macrocrystal-monohydrate, (MACROBID) 100 MG capsule Take 1 capsule (100 mg total) by mouth 2 (two) times daily. (Patient not taking: Reported on 03/01/2021) 10 capsule 0   sucralfate (CARAFATE) 1 g tablet Take 2 g by mouth 2 (two) times daily. (Patient not taking: Reported on 03/01/2021)     No facility-administered medications prior to visit.    Review of Systems  Constitutional:  Negative for chills, diaphoresis, fever, malaise/fatigue and weight loss.  HENT:  Negative for congestion.   Respiratory:  Positive for shortness of breath. Negative for cough, hemoptysis, sputum production and wheezing.   Cardiovascular:  Negative for chest pain, palpitations and leg swelling.   Objective:   Vitals:   03/01/21 1044  BP: 110/80  Pulse: (!) 111  Temp: 98.7 F (37.1 C)  TempSrc: Oral  SpO2: 97%  Height: 5' 5.5" (1.664 m)   SpO2: 97 % O2 Device: None (Room air)   Physical Exam: General: Well-appearing, no acute distress HENT: [place], AT Eyes: EOMI, no scleral icterus Respiratory: Clear to auscultation bilaterally.  No crackles, wheezing or rales Cardiovascular: RRR, -M/R/G, no JVD Extremities:-Edema,-tenderness Neuro: AAO x4, CNII-XII grossly intact Psych: Normal mood, normal affect  Data Reviewed:  Imaging: CXR 08/04/2017-no acute cardiopulmonary abnormalities.  Mild hyperinflation without flattening of the diaphragms CXR 12/09/2016-mild hyperinflation.  No infiltrates, effusion or edema.  PFT: None on file  Labs: CBC and CMP 11/02/2018-unremarkable.  Eosinophils 100    Assessment & Plan:  Discussion: 45 year old female with presents for follow-up. Intermittent symptoms well-controlled on SABA. Discussed bronchodilator management and asthma  action plan.  Irritant-induced asthma - improved, intermittent symptoms --REFILL Albuterol 2 puffs as needed every 4-6 hours for shortness of breath or wheezing. THIS IS RESCUE INHALER  --CONTINUE to minimize exposures  Asthma Action Plan Increase Symbicort  2 puffs TWO times a day for worsening shortness of breath,  wheezing and cough. If you symptoms do not improve in 24-48 hours, please our office for evaluation and/or prednisone taper.   Health Maintenance Immunization History  Administered Date(s) Administered   Influenza Split 03/03/2012, 02/18/2015   Influenza, Seasonal, Injecte, Preservative Fre 03/03/2017   Influenza,inj,Quad PF,6+ Mos 01/31/2016, 03/03/2017, 01/13/2018, 01/04/2019   Influenza-Unspecified 02/07/2020   Moderna Sars-Covid-2 Vaccination 07/14/2019, 08/12/2019   PPD Test 12/27/2014   Tdap 03/31/2011   No orders of the defined types were placed in this encounter.  Meds ordered this encounter  Medications   albuterol (VENTOLIN HFA) 108 (90 Base) MCG/ACT inhaler    Sig: Inhale 2 puffs into the lungs every 6 (six) hours as needed for wheezing or shortness of breath.    Dispense:  8 g    Refill:  2   Return if symptoms worsen or fail to improve.   I have spent a total time of 22-minutes on the day of the appointment reviewing prior documentation, coordinating care and discussing medical diagnosis and plan with the patient/family. Past medical history, allergies, medications were reviewed. Pertinent imaging, labs and tests included in this note have been reviewed and interpreted independently by me.  Christopher Creek, MD [facility] 03/01/2021 5:27 PM  Office Number [phone]

## 2021-03-01 NOTE — Patient Instructions (Addendum)
  Irritant-induced asthma - improved, intermittent symptoms --REFILL Albuterol 2 puffs as needed every 4-6 hours for shortness of breath or wheezing. THIS IS RESCUE INHALER  --CONTINUE to minimize exposures  Follow-up with me as needed

## 2021-03-04 ENCOUNTER — Inpatient Hospital Stay (HOSPITAL_COMMUNITY): Payer: PPO | Attending: Hematology

## 2021-03-04 ENCOUNTER — Other Ambulatory Visit: Payer: Self-pay

## 2021-03-04 DIAGNOSIS — D508 Other iron deficiency anemias: Secondary | ICD-10-CM | POA: Insufficient documentation

## 2021-03-04 DIAGNOSIS — Z79899 Other long term (current) drug therapy: Secondary | ICD-10-CM | POA: Diagnosis not present

## 2021-03-04 DIAGNOSIS — D7589 Other specified diseases of blood and blood-forming organs: Secondary | ICD-10-CM

## 2021-03-04 DIAGNOSIS — K909 Intestinal malabsorption, unspecified: Secondary | ICD-10-CM | POA: Insufficient documentation

## 2021-03-04 DIAGNOSIS — Z9884 Bariatric surgery status: Secondary | ICD-10-CM | POA: Insufficient documentation

## 2021-03-04 DIAGNOSIS — R7989 Other specified abnormal findings of blood chemistry: Secondary | ICD-10-CM

## 2021-03-04 DIAGNOSIS — D509 Iron deficiency anemia, unspecified: Secondary | ICD-10-CM

## 2021-03-04 LAB — CBC WITH DIFFERENTIAL/PLATELET
Abs Immature Granulocytes: 0.04 10*3/uL (ref 0.00–0.07)
Basophils Absolute: 0 10*3/uL (ref 0.0–0.1)
Basophils Relative: 1 %
Eosinophils Absolute: 0.2 10*3/uL (ref 0.0–0.5)
Eosinophils Relative: 2 %
HCT: 43.1 % (ref 36.0–46.0)
Hemoglobin: 13.7 g/dL (ref 12.0–15.0)
Immature Granulocytes: 1 %
Lymphocytes Relative: 23 %
Lymphs Abs: 2 10*3/uL (ref 0.7–4.0)
MCH: 31.6 pg (ref 26.0–34.0)
MCHC: 31.8 g/dL (ref 30.0–36.0)
MCV: 99.3 fL (ref 80.0–100.0)
Monocytes Absolute: 0.5 10*3/uL (ref 0.1–1.0)
Monocytes Relative: 6 %
Neutro Abs: 6 10*3/uL (ref 1.7–7.7)
Neutrophils Relative %: 67 %
Platelets: 323 10*3/uL (ref 150–400)
RBC: 4.34 MIL/uL (ref 3.87–5.11)
RDW: 12.8 % (ref 11.5–15.5)
WBC: 8.8 10*3/uL (ref 4.0–10.5)
nRBC: 0 % (ref 0.0–0.2)

## 2021-03-04 LAB — IRON AND TIBC
Iron: 30 ug/dL (ref 28–170)
Saturation Ratios: 8 % — ABNORMAL LOW (ref 10.4–31.8)
TIBC: 360 ug/dL (ref 250–450)
UIBC: 330 ug/dL

## 2021-03-04 LAB — LACTATE DEHYDROGENASE: LDH: 158 U/L (ref 98–192)

## 2021-03-04 LAB — VITAMIN B12: Vitamin B-12: 1717 pg/mL — ABNORMAL HIGH (ref 180–914)

## 2021-03-04 LAB — FERRITIN: Ferritin: 19 ng/mL (ref 11–307)

## 2021-03-06 LAB — HOMOCYSTEINE: Homocysteine: 5.8 umol/L (ref 0.0–14.5)

## 2021-03-06 NOTE — Progress Notes (Signed)
Virtual Visit via Telephone Note [facility]  I connected with Sosha Shepherd Sparr  on 03/07/21  at  8:51 AM  by telephone and verified that I am speaking with the correct person using two identifiers.  Location: Patient: Home Provider: [facility]   I discussed the limitations, risks, security and privacy concerns of performing an evaluation and management service by telephone and the availability of in person appointments. I also discussed with the patient that there may be a patient responsible charge related to this service. The patient expressed understanding and agreed to proceed.   HISTORY OF PRESENT ILLNESS: Ms. Leonora Gores Norlander follows at our clinic for iron deficiency and B12 deficiency secondary to malabsorption from gastric bypass surgery and chronic PPI use.  She was last evaluated by Tarri Abernethy PA-C via telemedicine visit on 11/28/2020.    In September 2022, patient contacted our office to let us know that she had had a surgery to remove breast implants, which resulted in a large hematoma and some blood loss, and was feeling weak, pale, and short of breath with exertion.  Labs were obtained at that time which showed mild anemia with Hgb 11.6, ferritin 72, and iron saturation 15%.  At today's visit, patient reports that she feels somewhat poorly due to fatigue. Apart from her postsurgical hematoma and bleeding complications, she has not had any other abnormal bleeding such as hematemesis, hematochezia, melena, or epistaxis.  She reports that her menstrual periods are unchanged.  She admits to significant fatigue and lightheaded episodes, but she denies any pica, restless legs, chest pain, dyspnea on exertion, or syncope.  Patient has other complaints unrelated to today's visit, including diffuse chronic pain. She is not currently taking any dietary vitamin or mineral supplements.  She has 40% energy and 100% appetite.  She is eating  well to maintain a stable weight at this time.   OBSERVATIONS/OBJECTIVE: Review of Systems  Constitutional:  Positive for malaise/fatigue. Negative for chills, diaphoresis, fever and weight loss.  Respiratory:  Negative for cough and shortness of breath.   Cardiovascular:  Negative for chest pain and palpitations.  Gastrointestinal:  Negative for abdominal pain, blood in stool, melena, nausea and vomiting.  Musculoskeletal:  Positive for back pain, joint pain and myalgias.  Neurological:  Positive for tingling. Negative for dizziness and headaches.  Psychiatric/Behavioral:  Positive for depression. The patient is nervous/anxious and has insomnia.     PHYSICAL EXAM (per limitations of virtual telephone visit): The patient is alert and oriented x 3, exhibiting adequate mentation, good mood, and ability to speak in full sentences and execute sound judgement.   ASSESSMENT & PLAN: 1.  Iron deficiency state: - Secondary to malabsorption in the setting of prior gastric bypass surgery and chronic PPI use. - Patient denies any signs or symptoms of abnormal blood loss.  Menses are regular, last 3 days, but with heavy blood loss and clots per patient report.  - She reports that her surgery in August 2022 (removal of breast implants) resulted in large hematoma requiring surgical drainage - Injectafer on 01/28/2019 and 05/04/2020 - patient had a reaction after her Injectafer treatment (fever, headaches, general malaise), despite being premedicated with Benadryl, Tylenol, and Solu-Medrol.  She had previous reaction to [facility] and has multiple medication allergies. -Most recent labs (03/04/2021): Hgb 13.7, normal CBC.  Ferritin 19, iron saturation 8%. - Symptomatic with significant fatigue - PLAN: Recommend IV iron with Venofer 200 mg x 5 doses due to  symptomatic iron deficiency. - Due to history of side effects and mild reaction to Injectafer and Feraheme, we will premedicate with steroids. - Discussed with  patient that flulike syndrome and malaise is a normal side effect in some patients, and does not constitute a medical allergy. - We will recheck labs and RTC in 3 months.  2.  Macrocytosis: - She has persistent macrocytosis for the past few times, but with normal MCV on most recent CBC (03/04/2021) with MCV 99.3. - She had history of fatty infiltration of liver prior to her gastric bypass surgery (per abdominal US on 07/13/2009).  No subsequent imaging after surgery in 2015. -Additional work-up including SPEP and TSH was unremarkable.  LFTs normal.  Normal LDH, ESR, CRP, copper. - Most recent labs (03/04/2021): B12 elevated at 1717.  Homocystine normal.  Methylmalonic acid are pending. - Most recent abdominal imaging with CT abdomen/renal stone study (10/14/2020): Unremarkable unenhanced appearance of liver, no focal lesion identified; spleen normal in size without focal abnormality - PLAN: Suspect macrocytosis possibly due to fatty liver disease.  In the absence of other hematologic abnormalities, we will continue to watch and wait at this time.   3.  Elevated B12 levels: - She had a persistently elevated B12 level since at least June of 2021 - She is not on any B12 supplements at this time. - Unclear etiology, I have explained to her that this could b is most likely in e acute phase reactant.  - PLAN: We will continue to watch and wait.  No immediate cause for concern from a hematology/oncology standpoint, patient may benefit from additional work-up by other specialists in this regard.   FOLLOW UP INSTRUCTIONS: -IV Venofer 200 mg x 5 with premedications - Labs and phone visit in 3 months    I discussed the assessment and treatment plan with the patient. The patient was provided an opportunity to ask questions and all were answered. The patient agreed with the plan and demonstrated an understanding of the instructions.   The patient was advised to call back or seek an in-person evaluation if  the symptoms worsen or if the condition fails to improve as anticipated.  I provided 15 minutes of non-face-to-face time during this encounter.   Harriett Rush, PA-C 03/07/2021 9:08 AM

## 2021-03-07 ENCOUNTER — Inpatient Hospital Stay (HOSPITAL_BASED_OUTPATIENT_CLINIC_OR_DEPARTMENT_OTHER): Payer: PPO | Admitting: Physician Assistant

## 2021-03-07 DIAGNOSIS — D509 Iron deficiency anemia, unspecified: Secondary | ICD-10-CM

## 2021-03-07 LAB — METHYLMALONIC ACID, SERUM: Methylmalonic Acid, Quantitative: 207 nmol/L (ref 0–378)

## 2021-03-11 DIAGNOSIS — F5001 Anorexia nervosa, restricting type: Secondary | ICD-10-CM | POA: Diagnosis not present

## 2021-03-11 DIAGNOSIS — F411 Generalized anxiety disorder: Secondary | ICD-10-CM | POA: Diagnosis not present

## 2021-03-11 DIAGNOSIS — F331 Major depressive disorder, recurrent, moderate: Secondary | ICD-10-CM | POA: Diagnosis not present

## 2021-03-13 ENCOUNTER — Encounter: Payer: Self-pay | Admitting: Orthopaedic Surgery

## 2021-03-13 ENCOUNTER — Other Ambulatory Visit (HOSPITAL_COMMUNITY): Payer: Self-pay | Admitting: Psychiatry

## 2021-03-13 ENCOUNTER — Ambulatory Visit (INDEPENDENT_AMBULATORY_CARE_PROVIDER_SITE_OTHER): Payer: PPO

## 2021-03-13 ENCOUNTER — Ambulatory Visit: Payer: PPO | Admitting: Orthopaedic Surgery

## 2021-03-13 ENCOUNTER — Other Ambulatory Visit: Payer: Self-pay

## 2021-03-13 DIAGNOSIS — M25552 Pain in left hip: Secondary | ICD-10-CM | POA: Diagnosis not present

## 2021-03-13 DIAGNOSIS — M545 Low back pain, unspecified: Secondary | ICD-10-CM | POA: Diagnosis not present

## 2021-03-13 DIAGNOSIS — F331 Major depressive disorder, recurrent, moderate: Secondary | ICD-10-CM

## 2021-03-13 DIAGNOSIS — G8929 Other chronic pain: Secondary | ICD-10-CM | POA: Diagnosis not present

## 2021-03-13 DIAGNOSIS — F411 Generalized anxiety disorder: Secondary | ICD-10-CM

## 2021-03-13 DIAGNOSIS — F509 Eating disorder, unspecified: Secondary | ICD-10-CM

## 2021-03-13 DIAGNOSIS — F5101 Primary insomnia: Secondary | ICD-10-CM

## 2021-03-13 NOTE — Addendum Note (Signed)
Addended by: Precious Bard on: 03/13/2021 11:08 AM   Modules accepted: Orders

## 2021-03-13 NOTE — Progress Notes (Signed)
Office Visit Note   Patient: Katelyn [name]           Date of Birth: 08-May-1975           MRN: [id number] Visit Date: 03/13/2021              Requested by: Kathyrn Drown, MD [place],  [place] [address] PCP: Kathyrn Drown, MD   Assessment & Plan: Visit Diagnoses:  1. Chronic low back pain, unspecified back pain laterality, unspecified whether sciatica present     Plan: Impression is chronic low back pain and left hip pain.  She has had a prior lumbar spine and pelvis MRI about 4 to 5 years ago.  She has undergone extensive physical therapy in the last 3 years without much relief.  At this point we will need updated MRIs to rule out structural abnormalities.  I will communicate with the patient through MyChart once the results are available regarding next steps.  Follow-Up Instructions: No follow-ups on file.   Orders:  Orders Placed This Encounter  Procedures   XR Lumbar Spine 2-3 Views   No orders of the defined types were placed in this encounter.     Procedures: No procedures performed   Clinical Data: No additional findings.   Subjective: Chief Complaint  Patient presents with   Lowry is a 45 year old female comes in for chronic low back pain and left leg pain.  She is currently in a pain clinic in [place].  She has had buttock pain and left hip pain ever since she woke up from elective surgery for breast augmentation.  She was subsequently diagnosed with small fiber neuropathy and pudendal neuritis.  She currently sits on a cushion.  Unable to lay on the left side or on her back due to pain.  She had cortisone injection in the left trochanteric bursa which gave her really good relief for 6 weeks but unfortunately it led to a gastric ulcer.  She denies any groin pain.  She has a constant and chronic pain.   Review of Systems  Constitutional: Negative.   HENT: Negative.    Eyes: Negative.    Respiratory: Negative.    Cardiovascular: Negative.   Endocrine: Negative.   Musculoskeletal: Negative.   Neurological: Negative.   Hematological: Negative.   Psychiatric/Behavioral: Negative.    All other systems reviewed and are negative.   Objective: Vital Signs: There were no vitals taken for this visit.  Physical Exam Vitals and nursing note reviewed.  Constitutional:      Appearance: She is well-developed.  Pulmonary:     Effort: Pulmonary effort is normal.  Skin:    General: Skin is warm.     Capillary Refill: Capillary refill takes less than 2 seconds.  Neurological:     Mental Status: She is alert and oriented to person, place, and time.  Psychiatric:        Behavior: Behavior normal.        Thought Content: Thought content normal.        Judgment: Judgment normal.    Ortho Exam  Left hip exam shows normal range of motion.  She has tenderness to the lateral aspect.  No sciatic tension signs.  Lumbar spine is tender to palpation.  Normal range of motion.  No focal motor or sensory deficits.  Specialty Comments:  No specialty comments available.  Imaging: No results found.   PMFS History:  Patient Active Problem List   Diagnosis Date Noted   Vitamin B 12 deficiency 10/16/2019   Elevated LFTs 10/16/2019   Asthma, exogenous, unspecified asthma severity, uncomplicated 19/62/2297   Multiple environmental allergies 08/29/2018   B12 deficiency anemia 06/21/2018   Venous insufficiency 04/23/2018   Onychomycosis of toenail 04/17/2018   Chronic myofascial pain 03/18/2018   Small fiber neuropathy 03/18/2018   Idiopathic small fiber sensory neuropathy 02/23/2018   Varicose veins of both lower extremities 01/13/2018   Generalized pain 10/16/2017   Irregular menses 10/16/2017   Cervical spondylosis without myelopathy 08/28/2017   Migraine with aura and without status migrainosus, not intractable 07/22/2017   Lumbar degenerative disc disease 07/20/2017    Spondylosis of lumbar spine 07/20/2017   Coccydynia 03/12/2017   Bariatric surgery status 05/02/2016   Major depressive disorder, recurrent episode, moderate ([place]) 02/18/2016   Tachy-brady syndrome ([place]) 12/19/2015   Candidal vulvovaginitis 10/15/2015   Iron deficiency anemia 06/10/2015   Sleep disorder 06/01/2013   Generalized anxiety disorder 05/30/2013   Chronic headaches 07/01/2012   Depression, major, recurrent, severe with psychosis ([facility]) 06/17/2012   Fatigue 03/05/2012   Family hx colonic polyps 03/03/2012   Eating disorder 12/18/2011   POLYCYSTIC OVARIAN DISEASE 09/01/2008   Intermittent left-sided chest pain 08/02/2008   Past Medical History:  Diagnosis Date   Anemia    Anorexia    Anxiety    Asthma    controlled with meds   Benign juvenile melanoma    Chronic headaches    Colon polyps    found on colonoscopy 98/92/1194   Complication of anesthesia    itching after epidural for c section   Constipation    Depression    Depression    several suicide attempts, hospitaluzed in 2012 for this; hx pf ECT treatments ; pt sees Dr. Adele Schilder pyschiatrist  and doing well on medication   Headache    Heart murmur    as a child - no one mentions hearing murmur anymore   Heartburn    no meds   History of pneumonia    x 3  years ago - no recent problems   Hx of blood clots    Neuromuscular disorder (HCC)    small fiber neuropathy   Obesity    Pneumonia    Polycystic ovary    takes metformin to treat   Skin lesion    Excisional biopsy of moles - none cancerous   Sleep apnea    yrs ago - diagnosed mild sleep apnea - did not have to use cpap     Family History  Problem Relation Age of Onset   Hypercholesterolemia Mother    Hypertension Mother        Iterstitial Cystist   Hyperlipidemia Mother    Cancer Mother 65       breast    Depression Brother    Alcohol abuse Brother    Colon polyps Father    Depression Father    Irritable bowel syndrome Father    Alcohol  abuse Father    Colon cancer Paternal Aunt 68   Heart attack Paternal Grandfather    Kidney cancer Paternal Grandfather    Cancer Maternal Grandfather        unknown type   Esophageal cancer Neg Hx    Stomach cancer Neg Hx     Past Surgical History:  Procedure Laterality Date   BREAST ENHANCEMENT SURGERY Bilateral 02/11/2017   BREAST SURGERY N/A    Phreesia 10/10/2019  BREATH TEK H PYLORI N/A 07/22/2013   Procedure: BREATH TEK H PYLORI;  Surgeon: Edward Jolly, MD;  Location: [facility];  Service: General;  Laterality: N/A;   c sections  08/28/14   x 2   CESAREAN SECTION  2004, 2007   x 2   CESAREAN SECTION N/A    Phreesia 10/10/2019   COLONOSCOPY  2013   CYSTOSCOPY WITH URETHRAL DILATATION  age 43    GASTRIC ROUX-EN-Y N/A 10/31/2013   Procedure: LAPAROSCOPIC ROUX-EN-Y GASTRIC BYPASS WITH UPPER ENDOSCOPY;  Surgeon: Edward Jolly, MD;  Location: [facility];  Service: General;  Laterality: N/A;   ingrown toenail Right    Ingrown nail on great right toe   kidney stent  08/28/14   mole excision     "benign juvenile melanoma" removed from left leg - inner thigh   POLYPECTOMY     2013   ROUX-EN-Y PROCEDURE  08/28/14   SKIN LESION EXCISION     back   TONSILLECTOMY     TONSILLECTOMY  age 89   TUBAL LIGATION     Social History   Occupational History   Occupation: Unemployed Therapist, sports   Occupation: Disabled    Employer: UNEMPLOYED  Tobacco Use   Smoking status: Former    Packs/day: 1.00    Years: 5.00    Pack years: 5.00    Types: Cigarettes    Start date: 1994    Quit date: 08/26/1997    Years since quitting: 23.5   Smokeless tobacco: Never  Vaping Use   Vaping Use: Never used  Substance and Sexual Activity   Alcohol use: No    Alcohol/week: 0.0 standard drinks   Drug use: No   Sexual activity: Yes    Partners: Male    Birth control/protection: Surgical, Other-see comments

## 2021-03-15 ENCOUNTER — Inpatient Hospital Stay (HOSPITAL_COMMUNITY): Payer: PPO

## 2021-03-15 ENCOUNTER — Other Ambulatory Visit: Payer: Self-pay

## 2021-03-15 VITALS — BP 133/82 | HR 71 | Temp 98.8°F | Resp 18

## 2021-03-15 DIAGNOSIS — D508 Other iron deficiency anemias: Secondary | ICD-10-CM | POA: Diagnosis not present

## 2021-03-15 MED ORDER — FAMOTIDINE IN NACL 20-0.9 MG/50ML-% IV SOLN
20.0000 mg | Freq: Once | INTRAVENOUS | Status: AC
  Administered 2021-03-15: 20 mg via INTRAVENOUS
  Filled 2021-03-15: qty 50

## 2021-03-15 MED ORDER — SODIUM CHLORIDE 0.9 % IV SOLN
200.0000 mg | Freq: Once | INTRAVENOUS | Status: AC
  Administered 2021-03-15: 200 mg via INTRAVENOUS
  Filled 2021-03-15: qty 200

## 2021-03-15 MED ORDER — SODIUM CHLORIDE 0.9 % IV SOLN: Freq: Once | INTRAVENOUS | Status: AC

## 2021-03-15 NOTE — Progress Notes (Signed)
Pt presents today for Venofer IV iron infusion per provider's order. Vital signs stable and pt voiced no new complaints at this time. Pt took pre-meds Tylenol 500 mg and Benadryl 25 mg at 1030 prior to arrival. Pt refused to take pre-med Solu-medrol due to medication causing her to have stomach ulcers. Pt stated if she showed signs of a  reaction it was okay for her to receive Solu-medrol.  Message sent to C.Jones-Pharmacist and R.Pennington-PA. Message received R.Pennington-PA to give IV Pepcid. IV Pepcid  20 mg given. RLeron Croak stated we will just use the steroids if she starts to show any signs of reaction. Pt made aware and verbalized understanding.  Venofer 200 mg IV iron infusion given today per MD orders. Tolerated infusion without adverse affects. Vital signs stable. No complaints at this time. Discharged from clinic ambulatory in stable condition. Alert and oriented x 3. F/U with [facility] as scheduled.

## 2021-03-18 ENCOUNTER — Other Ambulatory Visit: Payer: Self-pay | Admitting: Family Medicine

## 2021-03-19 DIAGNOSIS — M62838 Other muscle spasm: Secondary | ICD-10-CM | POA: Diagnosis not present

## 2021-03-19 DIAGNOSIS — M6281 Muscle weakness (generalized): Secondary | ICD-10-CM | POA: Diagnosis not present

## 2021-03-19 DIAGNOSIS — R3911 Hesitancy of micturition: Secondary | ICD-10-CM | POA: Diagnosis not present

## 2021-03-19 DIAGNOSIS — N3943 Post-void dribbling: Secondary | ICD-10-CM | POA: Diagnosis not present

## 2021-03-19 DIAGNOSIS — R208 Other disturbances of skin sensation: Secondary | ICD-10-CM | POA: Diagnosis not present

## 2021-03-25 DIAGNOSIS — F411 Generalized anxiety disorder: Secondary | ICD-10-CM | POA: Diagnosis not present

## 2021-03-25 DIAGNOSIS — F331 Major depressive disorder, recurrent, moderate: Secondary | ICD-10-CM | POA: Diagnosis not present

## 2021-03-25 DIAGNOSIS — F5001 Anorexia nervosa, restricting type: Secondary | ICD-10-CM | POA: Diagnosis not present

## 2021-03-28 ENCOUNTER — Other Ambulatory Visit: Payer: Self-pay

## 2021-03-28 ENCOUNTER — Encounter (HOSPITAL_COMMUNITY): Payer: Self-pay | Admitting: Psychiatry

## 2021-03-28 ENCOUNTER — Telehealth (HOSPITAL_BASED_OUTPATIENT_CLINIC_OR_DEPARTMENT_OTHER): Payer: PPO | Admitting: Psychiatry

## 2021-03-28 VITALS — Wt 180.0 lb

## 2021-03-28 DIAGNOSIS — F509 Eating disorder, unspecified: Secondary | ICD-10-CM

## 2021-03-28 DIAGNOSIS — F331 Major depressive disorder, recurrent, moderate: Secondary | ICD-10-CM | POA: Diagnosis not present

## 2021-03-28 DIAGNOSIS — F5101 Primary insomnia: Secondary | ICD-10-CM

## 2021-03-28 DIAGNOSIS — F411 Generalized anxiety disorder: Secondary | ICD-10-CM

## 2021-03-28 MED ORDER — TEMAZEPAM 30 MG PO CAPS: 30.0000 mg | ORAL_CAPSULE | Freq: Every day | ORAL | 2 refills | Status: DC

## 2021-03-28 MED ORDER — BUPROPION HCL 75 MG PO TABS: ORAL_TABLET | ORAL | 2 refills | Status: DC

## 2021-03-28 MED ORDER — CLONAZEPAM 0.5 MG PO TABS: 0.5000 mg | ORAL_TABLET | Freq: Three times a day (TID) | ORAL | 2 refills | Status: DC | PRN

## 2021-03-28 MED ORDER — ZIPRASIDONE HCL 80 MG PO CAPS
80.0000 mg | ORAL_CAPSULE | Freq: Every day | ORAL | 2 refills | Status: DC
Start: 2021-03-28 — End: 2021-06-27

## 2021-03-28 NOTE — Progress Notes (Signed)
Virtual Visit via Telephone Note  I connected with Katelyn [name] on 03/28/21 at  8:40 AM EST by telephone and verified that I am speaking with the correct person using two identifiers.  Location: Patient: Home Provider: Home Office   I discussed the limitations, risks, security and privacy concerns of performing an evaluation and management service by telephone and the availability of in person appointments. I also discussed with the patient that there may be a patient responsible charge related to this service. The patient expressed understanding and agreed to proceed.   History of Present Illness: Patient is evaluated by phone session.  Patient reported Thanksgiving was okay but before and after Thanksgiving family members were sick.  Her 60 year old daughter [name] is seeing a therapist who is diagnosed with autism spectrum, OCD, eating disorder and central auditory processor illness.  Her 63 year old son dropped out of from school is not sure what he wants to do in his life.  Patient had a support from her parents and her husband.  Her pain somewhat better since her pain management change her dose of narcotic pain medication.  Since breast implant removed she noticed some improvement in the sensitivity but is still have a lot of pain and that limits her to do things.  There are days that she does not take shower, remain isolated and will not go outside because of the pain.  She is hoping since pain medicine or change she may get better response.  She denies any hallucination, paranoia, suicidal thoughts.  Her sleep is better since we switched from Ambien to temazepam.  Recently she had blood work which are stable.  She is taking moderate dose of gabapentin, hydrocodone and muscle relaxant.  She is in therapy with Meredith Mody for counseling and Lynden Ang nutritionist for eating disorder.  She denies any recent restriction or symptoms of anorexia.  She is not doing any self-induced vomiting.   Her weight is stable.  She has no tremor or shakes or any EPS.  She denies any major panic attack but admitted feeling anxious about her general health and family.  Past Psychiatric History:  H/O multiple inpatient. Last admission Feb 2015.  Good response with ECT but scared to continue maintenance ECT.  Tried Cymbalta (suicidal thoughts), Lexapro, Prozac (sexual side effects), Abilify, lithium, Lamictal, Ritalin, Remeron, Risperdal, Neurontin, Vistaril, BuSpar and Valium.  Recent Results (from the past 2160 hour(s))  Sample to Blood Bank(Blood Bank Hold)     Status: None   Collection Time: 01/02/21  2:15 PM  Result Value Ref Range   Blood Bank Specimen SAMPLE AVAILABLE FOR TESTING    Sample Expiration      01/05/2021,2359 Performed at [facility], [address] [place], [place] [address]   CBC with Differential/Platelet     Status: Abnormal   Collection Time: 01/02/21  2:15 PM  Result Value Ref Range   WBC 8.9 4.0 - 10.5 K/uL   RBC 3.48 (L) 3.87 - 5.11 MIL/uL   Hemoglobin 11.6 (L) 12.0 - 15.0 g/dL   HCT 36.0 36.0 - 46.0 %   MCV 103.4 (H) 80.0 - 100.0 fL   MCH 33.3 26.0 - 34.0 pg   MCHC 32.2 30.0 - 36.0 g/dL   RDW 12.7 11.5 - 15.5 %   Platelets 465 (H) 150 - 400 K/uL   nRBC 0.0 0.0 - 0.2 %   Neutrophils Relative % 69 %   Neutro Abs 6.2 1.7 - 7.7 K/uL   Lymphocytes Relative 21 %  Lymphs Abs 1.9 0.7 - 4.0 K/uL   Monocytes Relative 6 %   Monocytes Absolute 0.5 0.1 - 1.0 K/uL   Eosinophils Relative 2 %   Eosinophils Absolute 0.2 0.0 - 0.5 K/uL   Basophils Relative 1 %   Basophils Absolute 0.1 0.0 - 0.1 K/uL   Immature Granulocytes 1 %   Abs Immature Granulocytes 0.05 0.00 - 0.07 K/uL    Comment: Performed at [facility], [address], [place], [place] [address]  Ferritin     Status: None   Collection Time: 01/02/21  2:16 PM  Result Value Ref Range   Ferritin 72 11 - 307 ng/mL    Comment: Performed at [facility], [address] [place], [place] [address]  Iron and TIBC      Status: None   Collection Time: 01/02/21  2:16 PM  Result Value Ref Range   Iron 43 28 - 170 ug/dL   TIBC 280 250 - 450 ug/dL   Saturation Ratios 15 10.4 - 31.8 %   UIBC 237 ug/dL    Comment: Performed at [facility], [address] [place], [place] [address]  Ferritin     Status: None   Collection Time: 03/04/21  8:43 AM  Result Value Ref Range   Ferritin 19 11 - 307 ng/mL    Comment: Performed at [facility], [address], [place], [place] [address]  Iron and TIBC     Status: Abnormal   Collection Time: 03/04/21  8:43 AM  Result Value Ref Range   Iron 30 28 - 170 ug/dL   TIBC 360 250 - 450 ug/dL   Saturation Ratios 8 (L) 10.4 - 31.8 %   UIBC 330 ug/dL    Comment: Performed at [facility], [address], [place], [place] [address]  CBC with Differential/Platelet     Status: None   Collection Time: 03/04/21  8:43 AM  Result Value Ref Range   WBC 8.8 4.0 - 10.5 K/uL   RBC 4.34 3.87 - 5.11 MIL/uL   Hemoglobin 13.7 12.0 - 15.0 g/dL   HCT 43.1 36.0 - 46.0 %   MCV 99.3 80.0 - 100.0 fL   MCH 31.6 26.0 - 34.0 pg   MCHC 31.8 30.0 - 36.0 g/dL   RDW 12.8 11.5 - 15.5 %   Platelets 323 150 - 400 K/uL   nRBC 0.0 0.0 - 0.2 %   Neutrophils Relative % 67 %   Neutro Abs 6.0 1.7 - 7.7 K/uL   Lymphocytes Relative 23 %   Lymphs Abs 2.0 0.7 - 4.0 K/uL   Monocytes Relative 6 %   Monocytes Absolute 0.5 0.1 - 1.0 K/uL   Eosinophils Relative 2 %   Eosinophils Absolute 0.2 0.0 - 0.5 K/uL   Basophils Relative 1 %   Basophils Absolute 0.0 0.0 - 0.1 K/uL   Immature Granulocytes 1 %   Abs Immature Granulocytes 0.04 0.00 - 0.07 K/uL    Comment: Performed at [facility], [address], [place], [place] [address]  Vitamin B12     Status: Abnormal   Collection Time: 03/04/21  8:43 AM  Result Value Ref Range   Vitamin B-12 1,717 (H) 180 - 914 pg/mL    Comment: RESULTS CONFIRMED BY MANUAL DILUTION (NOTE) This assay is not validated for testing neonatal or myeloproliferative syndrome  specimens for Vitamin B12 levels. Performed at [facility], [address], [place], [place] [address]   Methylmalonic acid, serum     Status: None  Collection Time: 03/04/21  8:43 AM  Result Value Ref Range   Methylmalonic Acid, Quantitative 207 0 - 378 nmol/L    Comment: (NOTE) This test was developed and its performance characteristics determined by [facility]. It has not been cleared or approved by the Food and Drug Administration. Performed At: [facility] [place], [place] [id number] Rush Farmer MD [id number]   Homocysteine, serum     Status: None   Collection Time: 03/04/21  8:43 AM  Result Value Ref Range   Homocysteine 5.8 0.0 - 14.5 umol/L    Comment: (NOTE) Performed At: [facility] [place], [place] [id number] Rush Farmer MD [id number]   Lactate dehydrogenase     Status: None   Collection Time: 03/04/21  8:43 AM  Result Value Ref Range   LDH 158 98 - 192 U/L    Comment: Performed at [facility], [address], [place], [place] [address]     Psychiatric Specialty Exam: Physical Exam  Review of Systems  Weight 180 lb (81.6 kg).There is no height or weight on file to calculate BMI.  General Appearance: NA  Eye Contact:  NA  Speech:  Clear and Coherent  Volume:  Normal  Mood:  Anxious  Affect:  NA  Thought Process:  Descriptions of Associations: Intact  Orientation:  Full (Time, Place, and Person)  Thought Content:  Rumination  Suicidal Thoughts:  No  Homicidal Thoughts:  No  Memory:  Immediate;   Good Recent;   Fair Remote;   Fair  Judgement:  Intact  Insight:  Present  Psychomotor Activity:  NA  Concentration:  Concentration: Fair and Attention Span: Fair  Recall:  Good  Fund of Knowledge:  Good  Language:  Good  Akathisia:  No  Handed:  Right  AIMS (if indicated):     Assets:  Communication Skills Desire for Improvement Housing Social Support  ADL's:  Intact  Cognition:  WNL  Sleep:    better with Restoril     Assessment and Plan: Primary insomnia.  Eating disorder, NOS.  Major depressive disorder, recurrent.  Generalized anxiety disorder.  I reviewed blood work results.  Her labs are stable.  She is getting our infusion every week.  We talked about coping skills despite having challenges in the life.  Her stressors are chronic pain family issues.  Since we adjust the medication for her sleep she is sleeping better.  We talk about keep the current medication for now and continue therapy with Nyoka Cowden and see nutritionist Lynden Ang for eating disorder.  Her plan is to cut down slowly her pain medicine when her pain gets under control.  For now continue Wellbutrin 75 mg 2 tablets a day, temazepam 30 mg at bedtime, Klonopin 0.5 mg up to 3 times a day and Geodon 80 mg at bedtime.  We talk about benzodiazepine dependence tolerance, withdrawal.  Patient is not asking early refills.  Recommended to call us back if she has any question or any concern.  She is hoping to have good Christmas around her family member.  Follow-up in 3 months.    Follow Up Instructions:    I discussed the assessment and treatment plan with the patient. The patient was provided an opportunity to ask questions and all were answered. The patient agreed with the plan and demonstrated an understanding of the instructions.   The patient was advised to call back or seek an in-person evaluation if the symptoms worsen or if the condition  fails to improve as anticipated.  I provided 31 minutes of non-face-to-face time during this encounter.   Kathlee Nations, MD

## 2021-03-29 ENCOUNTER — Other Ambulatory Visit: Payer: Self-pay

## 2021-03-29 ENCOUNTER — Inpatient Hospital Stay (HOSPITAL_COMMUNITY): Payer: PPO | Attending: Hematology

## 2021-03-29 VITALS — BP 139/73 | HR 75 | Temp 99.4°F | Resp 16 | Ht 65.0 in

## 2021-03-29 DIAGNOSIS — Z79899 Other long term (current) drug therapy: Secondary | ICD-10-CM | POA: Diagnosis not present

## 2021-03-29 DIAGNOSIS — D508 Other iron deficiency anemias: Secondary | ICD-10-CM | POA: Insufficient documentation

## 2021-03-29 MED ORDER — ACETAMINOPHEN 325 MG PO TABS: 650.0000 mg | ORAL_TABLET | Freq: Once | ORAL | Status: DC

## 2021-03-29 MED ORDER — LORATADINE 10 MG PO TABS: 10.0000 mg | ORAL_TABLET | Freq: Once | ORAL | Status: DC

## 2021-03-29 MED ORDER — SODIUM CHLORIDE 0.9 % IV SOLN
200.0000 mg | Freq: Once | INTRAVENOUS | Status: AC
  Administered 2021-03-29: 200 mg via INTRAVENOUS
  Filled 2021-03-29: qty 200

## 2021-03-29 MED ORDER — FAMOTIDINE IN NACL 20-0.9 MG/50ML-% IV SOLN: 20.0000 mg | Freq: Once | INTRAVENOUS | Status: DC

## 2021-03-29 MED ORDER — METHYLPREDNISOLONE SODIUM SUCC 125 MG IJ SOLR: 125.0000 mg | Freq: Once | INTRAMUSCULAR | Status: DC

## 2021-03-29 MED ORDER — FAMOTIDINE IN NACL 20-0.9 MG/50ML-% IV SOLN
20.0000 mg | Freq: Once | INTRAVENOUS | Status: DC
  Filled 2021-03-29: qty 50

## 2021-03-29 MED ORDER — SODIUM CHLORIDE 0.9 % IV SOLN: Freq: Once | INTRAVENOUS | Status: AC

## 2021-03-29 NOTE — Patient Instructions (Signed)
[facility]  Discharge Instructions: Thank you for choosing [facility] to provide your oncology and hematology care.  If you have a lab appointment with the Cancer Center, please come in thru the Main Entrance and check in at the main information desk.  Wear comfortable clothing and clothing appropriate for easy access to any Portacath or PICC line.   We strive to give you quality time with your provider. You may need to reschedule your appointment if you arrive late (15 or more minutes).  Arriving late affects you and other patients whose appointments are after yours.  Also, if you miss three or more appointments without notifying the office, you may be dismissed from the clinic at the provider's discretion.      For prescription refill requests, have your pharmacy contact our office and allow 72 hours for refills to be completed.        To help prevent nausea and vomiting after your treatment, we encourage you to take your nausea medication as directed.  BELOW ARE SYMPTOMS THAT SHOULD BE REPORTED IMMEDIATELY: *FEVER GREATER THAN 100.4 F (38 C) OR HIGHER *CHILLS OR SWEATING *NAUSEA AND VOMITING THAT IS NOT CONTROLLED WITH YOUR NAUSEA MEDICATION *UNUSUAL SHORTNESS OF BREATH *UNUSUAL BRUISING OR BLEEDING *URINARY PROBLEMS (pain or burning when urinating, or frequent urination) *BOWEL PROBLEMS (unusual diarrhea, constipation, pain near the anus) TENDERNESS IN MOUTH AND THROAT WITH OR WITHOUT PRESENCE OF ULCERS (sore throat, sores in mouth, or a toothache) UNUSUAL RASH, SWELLING OR PAIN  UNUSUAL VAGINAL DISCHARGE OR ITCHING   Items with * indicate a potential emergency and should be followed up as soon as possible or go to the Emergency Department if any problems should occur.  Please show the CHEMOTHERAPY ALERT CARD or IMMUNOTHERAPY ALERT CARD at check-in to the Emergency Department and triage nurse.  Should you have questions after your visit or need to cancel  or reschedule your appointment, please contact [facility] [phone]  and follow the prompts.  Office hours are 8:00 a.m. to 4:30 p.m. Monday - Friday. Please note that voicemails left after 4:00 p.m. may not be returned until the following business day.  We are closed weekends and major holidays. You have access to a nurse at all times for urgent questions. Please call the main number to the clinic [phone] and follow the prompts.  For any non-urgent questions, you may also contact your provider using MyChart. We now offer e-Visits for anyone 18 and older to request care online for non-urgent symptoms. For details visit [email or web address].   Also download the MyChart app! Go to the app store, search "MyChart", open the app, select [facility], and log in with your MyChart username and password.  Due to Covid, a mask is required upon entering the hospital/clinic. If you do not have a mask, one will be given to you upon arrival. For doctor visits, patients may have 1 support person aged 18 or older with them. For treatment visits, patients cannot have anyone with them due to current Covid guidelines and our immunocompromised population.  

## 2021-03-29 NOTE — Progress Notes (Signed)
Patient presents today for iron infusion.  Patient is in satisfactory condition with no new complaints voiced.  Vital signs are stable.  We will proceed with treatment per MD orders.   Patient tolerated treatment well with no complaints voiced.  Patient left ambulatory in stable condition.  Vital signs stable at discharge.  Follow up as scheduled.    

## 2021-03-31 ENCOUNTER — Ambulatory Visit
Admission: RE | Admit: 2021-03-31 | Discharge: 2021-03-31 | Disposition: A | Discharge status: Home / Self Care | Payer: PPO | Source: Ambulatory Visit | Attending: Orthopaedic Surgery | Admitting: Orthopaedic Surgery

## 2021-03-31 ENCOUNTER — Other Ambulatory Visit: Payer: Self-pay

## 2021-03-31 DIAGNOSIS — M545 Low back pain, unspecified: Secondary | ICD-10-CM | POA: Diagnosis not present

## 2021-03-31 DIAGNOSIS — M25552 Pain in left hip: Secondary | ICD-10-CM

## 2021-03-31 DIAGNOSIS — R29898 Other symptoms and signs involving the musculoskeletal system: Secondary | ICD-10-CM | POA: Diagnosis not present

## 2021-04-02 DIAGNOSIS — N3943 Post-void dribbling: Secondary | ICD-10-CM | POA: Diagnosis not present

## 2021-04-02 DIAGNOSIS — R208 Other disturbances of skin sensation: Secondary | ICD-10-CM | POA: Diagnosis not present

## 2021-04-02 DIAGNOSIS — M62838 Other muscle spasm: Secondary | ICD-10-CM | POA: Diagnosis not present

## 2021-04-02 DIAGNOSIS — R3911 Hesitancy of micturition: Secondary | ICD-10-CM | POA: Diagnosis not present

## 2021-04-02 DIAGNOSIS — M6281 Muscle weakness (generalized): Secondary | ICD-10-CM | POA: Diagnosis not present

## 2021-04-02 NOTE — Progress Notes (Signed)
Needs appt.  Thanks.

## 2021-04-04 DIAGNOSIS — F331 Major depressive disorder, recurrent, moderate: Secondary | ICD-10-CM | POA: Diagnosis not present

## 2021-04-04 DIAGNOSIS — F5001 Anorexia nervosa, restricting type: Secondary | ICD-10-CM | POA: Diagnosis not present

## 2021-04-04 DIAGNOSIS — F411 Generalized anxiety disorder: Secondary | ICD-10-CM | POA: Diagnosis not present

## 2021-04-05 ENCOUNTER — Other Ambulatory Visit: Payer: Self-pay

## 2021-04-05 ENCOUNTER — Inpatient Hospital Stay (HOSPITAL_COMMUNITY): Payer: PPO

## 2021-04-05 VITALS — BP 127/84 | HR 86 | Temp 98.3°F | Resp 16 | Ht 65.0 in

## 2021-04-05 DIAGNOSIS — D508 Other iron deficiency anemias: Secondary | ICD-10-CM | POA: Diagnosis not present

## 2021-04-05 MED ORDER — FAMOTIDINE IN NACL 20-0.9 MG/50ML-% IV SOLN
20.0000 mg | Freq: Once | INTRAVENOUS | Status: DC
  Filled 2021-04-05: qty 50

## 2021-04-05 MED ORDER — SODIUM CHLORIDE 0.9 % IV SOLN: Freq: Once | INTRAVENOUS | Status: AC

## 2021-04-05 MED ORDER — SODIUM CHLORIDE 0.9 % IV SOLN
200.0000 mg | Freq: Once | INTRAVENOUS | Status: AC
  Administered 2021-04-05: 200 mg via INTRAVENOUS
  Filled 2021-04-05: qty 200

## 2021-04-05 MED ORDER — ACETAMINOPHEN 325 MG PO TABS: 650.0000 mg | ORAL_TABLET | Freq: Once | ORAL | Status: DC

## 2021-04-05 MED ORDER — METHYLPREDNISOLONE SODIUM SUCC 125 MG IJ SOLR: 125.0000 mg | Freq: Once | INTRAMUSCULAR | Status: DC

## 2021-04-05 MED ORDER — LORATADINE 10 MG PO TABS: 10.0000 mg | ORAL_TABLET | Freq: Once | ORAL | Status: DC

## 2021-04-05 NOTE — Progress Notes (Signed)
Patient took all her pre-meds at home.   Iron infusion given per orders. Patient tolerated it well without problems. Vitals stable and discharged home from clinic ambulatory. Follow up as scheduled.

## 2021-04-05 NOTE — Patient Instructions (Signed)
[facility]  Discharge Instructions: Thank you for choosing [facility] to provide your oncology and hematology care.  If you have a lab appointment with the [facility], please come in thru the Main Entrance and check in at the main information desk.    We strive to give you quality time with your provider. You may need to reschedule your appointment if you arrive late (15 or more minutes).  Arriving late affects you and other patients whose appointments are after yours.  Also, if you miss three or more appointments without notifying the office, you may be dismissed from the clinic at the provider's discretion.      For prescription refill requests, have your pharmacy contact our office and allow 72 hours for refills to be completed.       To help prevent nausea and vomiting after your treatment, we encourage you to take your nausea medication as directed.  Should you have questions after your visit or need to cancel or reschedule your appointment, please contact [facility] [phone]  and follow the prompts.  Office hours are 8:00 a.m. to 4:30 p.m. Monday - Friday. Please note that voicemails left after 4:00 p.m. may not be returned until the following business day.  We are closed weekends and major holidays. You have access to a nurse at all times for urgent questions. Please call the main number to the clinic [phone] and follow the prompts.  For any non-urgent questions, you may also contact your provider using MyChart. We now offer e-Visits for anyone 37 and older to request care online for non-urgent symptoms. For details visit [email or web address].   Also download the MyChart app! Go to the app store, search "MyChart", open the app, select [facility], and log in with your MyChart username and password.  Due to Covid, a mask is required upon entering the hospital/clinic. If you do not have a mask, one will be given to you upon arrival.  For doctor visits, patients may have 1 support person aged 67 or older with them. For treatment visits, patients cannot have anyone with them due to current Covid guidelines and our immunocompromised population.

## 2021-04-09 ENCOUNTER — Other Ambulatory Visit: Payer: Self-pay

## 2021-04-09 ENCOUNTER — Ambulatory Visit (INDEPENDENT_AMBULATORY_CARE_PROVIDER_SITE_OTHER): Payer: PPO | Admitting: Family Medicine

## 2021-04-09 VITALS — BP 116/78

## 2021-04-09 DIAGNOSIS — G608 Other hereditary and idiopathic neuropathies: Secondary | ICD-10-CM | POA: Diagnosis not present

## 2021-04-09 DIAGNOSIS — N261 Atrophy of kidney (terminal): Secondary | ICD-10-CM

## 2021-04-09 MED ORDER — ONDANSETRON HCL 8 MG PO TABS: 8.0000 mg | ORAL_TABLET | Freq: Three times a day (TID) | ORAL | 10 refills | Status: DC | PRN

## 2021-04-09 NOTE — Progress Notes (Signed)
° °  Subjective:    Patient ID: Katelyn [name], female    DOB: 10/20/1975, 45 y.o.   MRN: [id number]  HPI  Patient arrives to discuss recent MRI results completed thru specialist. We did discuss the MRI The MRI showed a fibroid Also showed atrophy of the left kidney Patient also states her blood pressures have been more elevated than usual Due to her underlying health issues she cannot sit well.  Therefore she typically either stands or lay down She has noticed that her blood pressure has been higher than normal but within parameters She does try to eat healthy avoids excessive salt Has no history of any type of personal kidney issues except for ureteral reflux and at age 56 had to have ureter surgery Review of Systems     Objective:   Physical Exam General-in no acute distress Eyes-no discharge Lungs-respiratory rate normal, CTA CV-no murmurs,RRR Extremities skin warm dry no edema Neuro grossly normal Behavior normal, alert        Assessment & Plan:  History of urinary reflux Atrophy left kidney Fibroid Will send copy of MRI to her gynecologist Will connect with MRI radiologist to have them tell us what the size of the kidney is it possible in comparison to the scan from June Lab work including metabolic 7 urine ACR May need urinary ultrasound to look at kidney size Also referral to nephrology but will wait on these other studies first  Also referral to neurology because of small fiber neuropathy.  Patient would like to see Dr. Cassell Smiles mother also sees this doctor

## 2021-04-10 ENCOUNTER — Encounter: Payer: Self-pay | Admitting: Orthopaedic Surgery

## 2021-04-10 ENCOUNTER — Ambulatory Visit (INDEPENDENT_AMBULATORY_CARE_PROVIDER_SITE_OTHER): Payer: PPO | Admitting: Orthopaedic Surgery

## 2021-04-10 DIAGNOSIS — S76012A Strain of muscle, fascia and tendon of left hip, initial encounter: Secondary | ICD-10-CM

## 2021-04-10 NOTE — Progress Notes (Signed)
Office Visit Note   Patient: Katelyn [name]           Date of Birth: 08/15/1975           MRN: [id number] Visit Date: 04/10/2021              Requested by: Kathyrn Drown, MD [place],  [place] [address] PCP: Kathyrn Drown, MD   Assessment & Plan: Visit Diagnoses:  1. Tear of left gluteus medius tendon, initial encounter     Plan: Breyana returns today for MRI review of the lumbar spine and left hip to look for sources of chronic left hip pain.  Overall she has been dealing with this left hip pain for a little over 2 years.  Left hip exam is unchanged.  The MRI of the lumbar spine is fairly unrevealing.  The MRI of the left hip does show partial-thickness partial width tear of the gluteus medius with bursitis and inflammation.  Given these findings I have talked to her about the option of surgical repair versus continuing to treat this conservatively and symptomatically.  I explained that my plan would be to do this in an open fashion but she had any desire to have this done arthroscopically I could refer her to one of my partners.  She will think about her options and follow-up in the office if she decides she wants to move forward.  We would need to get her fitted for hip abduction brace prior to surgery.  We will defer the chronic low back pain to her chronic pain doctor.  Follow-Up Instructions: No follow-ups on file.   Orders:  No orders of the defined types were placed in this encounter.  No orders of the defined types were placed in this encounter.     Procedures: No procedures performed   Clinical Data: No additional findings.   Subjective: Chief Complaint  Patient presents with   Lower Back - Pain   Left Hip - Pain    HPI  Review of Systems   Objective: Vital Signs: There were no vitals taken for this visit.  Physical Exam  Ortho Exam  Specialty Comments:  No specialty comments available.  Imaging: No results  found.   PMFS History: Patient Active Problem List   Diagnosis Date Noted   Vitamin B 12 deficiency 10/16/2019   Elevated LFTs 10/16/2019   Asthma, exogenous, unspecified asthma severity, uncomplicated 47/12/6281   Multiple environmental allergies 08/29/2018   B12 deficiency anemia 06/21/2018   Venous insufficiency 04/23/2018   Onychomycosis of toenail 04/17/2018   Chronic myofascial pain 03/18/2018   Small fiber neuropathy 03/18/2018   Idiopathic small fiber sensory neuropathy 02/23/2018   Varicose veins of both lower extremities 01/13/2018   Generalized pain 10/16/2017   Irregular menses 10/16/2017   Cervical spondylosis without myelopathy 08/28/2017   Migraine with aura and without status migrainosus, not intractable 07/22/2017   Lumbar degenerative disc disease 07/20/2017   Spondylosis of lumbar spine 07/20/2017   Coccydynia 03/12/2017   Bariatric surgery status 05/02/2016   Major depressive disorder, recurrent episode, moderate ([place]) 02/18/2016   Tachy-brady syndrome ([place]) 12/19/2015   Candidal vulvovaginitis 10/15/2015   Iron deficiency anemia 06/10/2015   Sleep disorder 06/01/2013   Generalized anxiety disorder 05/30/2013   Chronic headaches 07/01/2012   Depression, major, recurrent, severe with psychosis ([place]) 06/17/2012   Fatigue 03/05/2012   Family hx colonic polyps 03/03/2012   Eating disorder 12/18/2011   POLYCYSTIC OVARIAN DISEASE  09/01/2008   Intermittent left-sided chest pain 08/02/2008   Past Medical History:  Diagnosis Date   Anemia    Anorexia    Anxiety    Asthma    controlled with meds   Benign juvenile melanoma    Chronic headaches    Colon polyps    found on colonoscopy 55/73/2202   Complication of anesthesia    itching after epidural for c section   Constipation    Depression    Depression    several suicide attempts, hospitaluzed in 2012 for this; hx pf ECT treatments ; pt sees Dr. Adele Schilder pyschiatrist  and doing well on medication    Headache    Heart murmur    as a child - no one mentions hearing murmur anymore   Heartburn    no meds   History of pneumonia    x 3  years ago - no recent problems   Hx of blood clots    Neuromuscular disorder (HCC)    small fiber neuropathy   Obesity    Pneumonia    Polycystic ovary    takes metformin to treat   Skin lesion    Excisional biopsy of moles - none cancerous   Sleep apnea    yrs ago - diagnosed mild sleep apnea - did not have to use cpap     Family History  Problem Relation Age of Onset   Hypercholesterolemia Mother    Hypertension Mother        Iterstitial Cystist   Hyperlipidemia Mother    Cancer Mother 68       breast    Depression Brother    Alcohol abuse Brother    Colon polyps Father    Depression Father    Irritable bowel syndrome Father    Alcohol abuse Father    Colon cancer Paternal Aunt 60   Heart attack Paternal Grandfather    Kidney cancer Paternal Grandfather    Cancer Maternal Grandfather        unknown type   Esophageal cancer Neg Hx    Stomach cancer Neg Hx     Past Surgical History:  Procedure Laterality Date   BREAST ENHANCEMENT SURGERY Bilateral 02/11/2017   BREAST SURGERY N/A    Phreesia 10/10/2019   BREATH TEK H PYLORI N/A 07/22/2013   Procedure: BREATH TEK H PYLORI;  Surgeon: Edward Jolly, MD;  Location: [facility];  Service: General;  Laterality: N/A;   c sections  08/28/14   x 2   CESAREAN SECTION  2004, 2007   x 2   CESAREAN SECTION N/A    Phreesia 10/10/2019   COLONOSCOPY  2013   CYSTOSCOPY WITH URETHRAL DILATATION  age 74    GASTRIC ROUX-EN-Y N/A 10/31/2013   Procedure: LAPAROSCOPIC ROUX-EN-Y GASTRIC BYPASS WITH UPPER ENDOSCOPY;  Surgeon: Edward Jolly, MD;  Location: [facility];  Service: General;  Laterality: N/A;   ingrown toenail Right    Ingrown nail on great right toe   kidney stent  08/28/14   mole excision     "benign juvenile melanoma" removed from left leg - inner thigh   POLYPECTOMY     2013    ROUX-EN-Y PROCEDURE  08/28/14   SKIN LESION EXCISION     back   TONSILLECTOMY     TONSILLECTOMY  age 49   TUBAL LIGATION     Social History   Occupational History   Occupation: Unemployed Therapist, sports   Occupation: Disabled    Fish farm manager: UNEMPLOYED  Tobacco Use   Smoking status: Former    Packs/day: 1.00    Years: 5.00    Pack years: 5.00    Types: Cigarettes    Start date: 29    Quit date: 08/26/1997    Years since quitting: 23.6   Smokeless tobacco: Never  Vaping Use   Vaping Use: Never used  Substance and Sexual Activity   Alcohol use: No    Alcohol/week: 0.0 standard drinks   Drug use: No   Sexual activity: Yes    Partners: Male    Birth control/protection: Surgical, Other-see comments

## 2021-04-11 ENCOUNTER — Other Ambulatory Visit (HOSPITAL_COMMUNITY)
Admission: RE | Admit: 2021-04-11 | Discharge: 2021-04-11 | Disposition: A | Discharge status: Home / Self Care | Payer: PPO | Source: Ambulatory Visit | Attending: Family Medicine | Admitting: Family Medicine

## 2021-04-11 DIAGNOSIS — G9009 Other idiopathic peripheral autonomic neuropathy: Secondary | ICD-10-CM | POA: Diagnosis not present

## 2021-04-11 LAB — BASIC METABOLIC PANEL
Anion gap: 11 (ref 5–15)
BUN: 13 mg/dL (ref 6–20)
CO2: 26 mmol/L (ref 22–32)
Calcium: 8.9 mg/dL (ref 8.9–10.3)
Chloride: 102 mmol/L (ref 98–111)
Creatinine, Ser: 0.67 mg/dL (ref 0.44–1.00)
GFR, Estimated: >60 mL/min (ref >60)
Glucose, Bld: 94 mg/dL (ref 70–99)
Potassium: 3.9 mmol/L (ref 3.5–5.1)
Sodium: 139 mmol/L (ref 135–145)

## 2021-04-11 LAB — PHOSPHORUS: Phosphorus: 3.5 mg/dL (ref 2.5–4.6)

## 2021-04-11 LAB — MAGNESIUM: Magnesium: 2.2 mg/dL (ref 1.7–2.4)

## 2021-04-12 ENCOUNTER — Inpatient Hospital Stay (HOSPITAL_COMMUNITY): Payer: PPO

## 2021-04-12 ENCOUNTER — Other Ambulatory Visit: Payer: Self-pay

## 2021-04-12 ENCOUNTER — Encounter (HOSPITAL_COMMUNITY): Payer: Self-pay

## 2021-04-12 VITALS — BP 122/73 | HR 77 | Temp 98.4°F | Resp 18 | Ht 65.0 in

## 2021-04-12 DIAGNOSIS — D508 Other iron deficiency anemias: Secondary | ICD-10-CM | POA: Diagnosis not present

## 2021-04-12 LAB — MICROALBUMIN / CREATININE URINE RATIO
Creatinine, Urine: 11.7 mg/dL
Microalb Creat Ratio: <26 mg/g creat (ref 0–29)
Microalb, Ur: <3 ug/mL — ABNORMAL HIGH

## 2021-04-12 MED ORDER — SODIUM CHLORIDE 0.9 % IV SOLN: Freq: Once | INTRAVENOUS | Status: AC

## 2021-04-12 MED ORDER — SODIUM CHLORIDE 0.9 % IV SOLN
200.0000 mg | Freq: Once | INTRAVENOUS | Status: AC
  Administered 2021-04-12: 200 mg via INTRAVENOUS
  Filled 2021-04-12: qty 200

## 2021-04-12 NOTE — Patient Instructions (Signed)
Katelyn [name]  Discharge Instructions: Thank you for choosing [facility] to provide your oncology and hematology care.  If you have a lab appointment with the [facility], please come in thru the Main Entrance and check in at the main information desk.  Wear comfortable clothing and clothing appropriate for easy access to any Portacath or PICC line.   We strive to give you quality time with your provider. You may need to reschedule your appointment if you arrive late (15 or more minutes).  Arriving late affects you and other patients whose appointments are after yours.  Also, if you miss three or more appointments without notifying the office, you may be dismissed from the clinic at the providers discretion.      For prescription refill requests, have your pharmacy contact our office and allow 72 hours for refills to be completed.    Today you received the following chemotherapy and/or immunotherapy agents Venofer, return as scheduled.   To help prevent nausea and vomiting after your treatment, we encourage you to take your nausea medication as directed.  BELOW ARE SYMPTOMS THAT SHOULD BE REPORTED IMMEDIATELY: *FEVER GREATER THAN 100.4 F (38 C) OR HIGHER *CHILLS OR SWEATING *NAUSEA AND VOMITING THAT IS NOT CONTROLLED WITH YOUR NAUSEA MEDICATION *UNUSUAL SHORTNESS OF BREATH *UNUSUAL BRUISING OR BLEEDING *URINARY PROBLEMS (pain or burning when urinating, or frequent urination) *BOWEL PROBLEMS (unusual diarrhea, constipation, pain near the anus) TENDERNESS IN MOUTH AND THROAT WITH OR WITHOUT PRESENCE OF ULCERS (sore throat, sores in mouth, or a toothache) UNUSUAL RASH, SWELLING OR PAIN  UNUSUAL VAGINAL DISCHARGE OR ITCHING   Items with * indicate a potential emergency and should be followed up as soon as possible or go to the Emergency Department if any problems should occur.  Please show the CHEMOTHERAPY ALERT CARD or IMMUNOTHERAPY ALERT CARD at check-in to the  Emergency Department and triage nurse.  Should you have questions after your visit or need to cancel or reschedule your appointment, please contact [facility] [phone]  and follow the prompts.  Office hours are 8:00 a.m. to 4:30 p.m. Monday - Friday. Please note that voicemails left after 4:00 p.m. may not be returned until the following business day.  We are closed weekends and major holidays. You have access to a nurse at all times for urgent questions. Please call the main number to the clinic [phone] and follow the prompts.  For any non-urgent questions, you may also contact your provider using MyChart. We now offer e-Visits for anyone 44 and older to request care online for non-urgent symptoms. For details visit [email or web address].   Also download the MyChart app! Go to the app store, search "MyChart", open the app, select [facility], and log in with your MyChart username and password.  Due to Covid, a mask is required upon entering the hospital/clinic. If you do not have a mask, one will be given to you upon arrival. For doctor visits, patients may have 1 support person aged 12 or older with them. For treatment visits, patients cannot have anyone with them due to current Covid guidelines and our immunocompromised population.

## 2021-04-12 NOTE — Progress Notes (Signed)
Patient presents today for Venofer infusion, patient reports to RN that she tool Tylenol, Claritin, and Pepcid this morning at home. Patient tolerated iron infusion with no complaints voiced. Peripheral IV site clean and dry with good blood return noted before and after infusion. Band aid applied. Patient refused to stay 30 minute wait time post infusion per policy. VSS with discharge and left in satisfactory condition with no s/s of distress noted.

## 2021-04-14 ENCOUNTER — Encounter: Payer: Self-pay | Admitting: Family Medicine

## 2021-04-14 DIAGNOSIS — G608 Other hereditary and idiopathic neuropathies: Secondary | ICD-10-CM

## 2021-04-14 NOTE — Progress Notes (Signed)
Please send this with MRI report to [facility]

## 2021-04-15 ENCOUNTER — Telehealth (HOSPITAL_COMMUNITY): Payer: Self-pay | Admitting: *Deleted

## 2021-04-15 NOTE — Telephone Encounter (Signed)
Pt called with request to refill Restoril 30 mg caps as pharmacy told her there was no refill on file. Pt states that she was out of medication all weekend and it was an "emergency" situation. Writer called [facility] who confirmed script received from 03/28/21 with 2 refills and would fill for pt. Writer advised pt of this. Pt verbalizes understanding.

## 2021-04-16 DIAGNOSIS — R3911 Hesitancy of micturition: Secondary | ICD-10-CM | POA: Diagnosis not present

## 2021-04-16 DIAGNOSIS — R208 Other disturbances of skin sensation: Secondary | ICD-10-CM | POA: Diagnosis not present

## 2021-04-16 DIAGNOSIS — N3943 Post-void dribbling: Secondary | ICD-10-CM | POA: Diagnosis not present

## 2021-04-16 DIAGNOSIS — M6281 Muscle weakness (generalized): Secondary | ICD-10-CM | POA: Diagnosis not present

## 2021-04-16 DIAGNOSIS — M62838 Other muscle spasm: Secondary | ICD-10-CM | POA: Diagnosis not present

## 2021-04-17 ENCOUNTER — Telehealth: Payer: Self-pay | Admitting: Family Medicine

## 2021-04-17 NOTE — Telephone Encounter (Signed)
I did have discussion with Katelyn [name] today  As for her blood test and urine micro protein looking good it is unlikely that nephrology would have anything to offer her She is concerned that the atrophy of the left kidney could be related to the small fiber neuropathy because that can also cause autonomic issues including blood vessel issues Unfortunately this is outside of my scope of expertise to be able to advise her We will touch base with nephrology to see if they recommend any other ongoing issues More than likely will be a yearly blood work and urine micro protein Will also touch base with radiologist regarding if they feel the atrophy of the kidney is something that has rapidly taken place or more of a gradual

## 2021-04-17 NOTE — Addendum Note (Signed)
Addended by: Dairl Ponder on: 04/17/2021 05:09 PM   Modules accepted: Orders

## 2021-04-17 NOTE — Telephone Encounter (Signed)
Nurses I support Leilanny's desire to establish more local neurology.  And to have it within the [facility] system.  I would recommend [place] neurology for consultation regarding this issue-small fiber neuropathy

## 2021-04-19 ENCOUNTER — Inpatient Hospital Stay (HOSPITAL_COMMUNITY): Payer: PPO

## 2021-04-19 ENCOUNTER — Ambulatory Visit (HOSPITAL_COMMUNITY): Payer: PPO

## 2021-04-19 VITALS — BP 121/66 | HR 70 | Temp 98.6°F | Resp 18

## 2021-04-19 DIAGNOSIS — D508 Other iron deficiency anemias: Secondary | ICD-10-CM | POA: Diagnosis not present

## 2021-04-19 MED ORDER — FAMOTIDINE IN NACL 20-0.9 MG/50ML-% IV SOLN: 20.0000 mg | Freq: Once | INTRAVENOUS | Status: DC

## 2021-04-19 MED ORDER — ACETAMINOPHEN 325 MG PO TABS: 650.0000 mg | ORAL_TABLET | Freq: Once | ORAL | Status: DC

## 2021-04-19 MED ORDER — SODIUM CHLORIDE 0.9 % IV SOLN
200.0000 mg | Freq: Once | INTRAVENOUS | Status: AC
  Administered 2021-04-19: 09:00:00 200 mg via INTRAVENOUS
  Filled 2021-04-19: qty 200

## 2021-04-19 MED ORDER — LORATADINE 10 MG PO TABS: 10.0000 mg | ORAL_TABLET | Freq: Once | ORAL | Status: DC

## 2021-04-19 MED ORDER — SODIUM CHLORIDE 0.9 % IV SOLN: Freq: Once | INTRAVENOUS | Status: AC

## 2021-04-19 NOTE — Progress Notes (Signed)
Patient presents today for Venofer infusion per providers order.  Vital signs WNL.  Patient has no new complaints at this time.    Peripheral IV started and blood return noted pre and post infusion.  Venofer infusion given today per MD orders.  Stable during infusion without adverse affects.  Vital signs stable.  Patient declined to wait the 30 minute post infusion wait time.  No complaints at this time.  Discharge from clinic ambulatory in stable condition.  Alert and oriented X 3.  Follow up with [facility] as scheduled.

## 2021-04-19 NOTE — Patient Instructions (Signed)
[facility]  Discharge Instructions: Thank you for choosing [facility] to provide your oncology and hematology care.  If you have a lab appointment with the Cancer Center, please come in thru the Main Entrance and check in at the main information desk.  Wear comfortable clothing and clothing appropriate for easy access to any Portacath or PICC line.   We strive to give you quality time with your provider. You may need to reschedule your appointment if you arrive late (15 or more minutes).  Arriving late affects you and other patients whose appointments are after yours.  Also, if you miss three or more appointments without notifying the office, you may be dismissed from the clinic at the provider's discretion.      For prescription refill requests, have your pharmacy contact our office and allow 72 hours for refills to be completed.    Today you received the following chemotherapy and/or immunotherapy agents Venofer      To help prevent nausea and vomiting after your treatment, we encourage you to take your nausea medication as directed.  BELOW ARE SYMPTOMS THAT SHOULD BE REPORTED IMMEDIATELY: *FEVER GREATER THAN 100.4 F (38 C) OR HIGHER *CHILLS OR SWEATING *NAUSEA AND VOMITING THAT IS NOT CONTROLLED WITH YOUR NAUSEA MEDICATION *UNUSUAL SHORTNESS OF BREATH *UNUSUAL BRUISING OR BLEEDING *URINARY PROBLEMS (pain or burning when urinating, or frequent urination) *BOWEL PROBLEMS (unusual diarrhea, constipation, pain near the anus) TENDERNESS IN MOUTH AND THROAT WITH OR WITHOUT PRESENCE OF ULCERS (sore throat, sores in mouth, or a toothache) UNUSUAL RASH, SWELLING OR PAIN  UNUSUAL VAGINAL DISCHARGE OR ITCHING   Items with * indicate a potential emergency and should be followed up as soon as possible or go to the Emergency Department if any problems should occur.  Please show the CHEMOTHERAPY ALERT CARD or IMMUNOTHERAPY ALERT CARD at check-in to the Emergency  Department and triage nurse.  Should you have questions after your visit or need to cancel or reschedule your appointment, please contact [facility] [phone]  and follow the prompts.  Office hours are 8:00 a.m. to 4:30 p.m. Monday - Friday. Please note that voicemails left after 4:00 p.m. may not be returned until the following business day.  We are closed weekends and major holidays. You have access to a nurse at all times for urgent questions. Please call the main number to the clinic [phone] and follow the prompts.  For any non-urgent questions, you may also contact your provider using MyChart. We now offer e-Visits for anyone 18 and older to request care online for non-urgent symptoms. For details visit [email or web address].   Also download the MyChart app! Go to the app store, search "MyChart", open the app, select [facility], and log in with your MyChart username and password.  Due to Covid, a mask is required upon entering the hospital/clinic. If you do not have a mask, one will be given to you upon arrival. For doctor visits, patients may have 1 support person aged 18 or older with them. For treatment visits, patients cannot have anyone with them due to current Covid guidelines and our immunocompromised population.  

## 2021-04-22 ENCOUNTER — Encounter: Payer: Self-pay | Admitting: Orthopaedic Surgery

## 2021-04-23 ENCOUNTER — Other Ambulatory Visit: Payer: Self-pay

## 2021-04-23 DIAGNOSIS — M25552 Pain in left hip: Secondary | ICD-10-CM

## 2021-04-23 DIAGNOSIS — F411 Generalized anxiety disorder: Secondary | ICD-10-CM | POA: Diagnosis not present

## 2021-04-23 DIAGNOSIS — S76012A Strain of muscle, fascia and tendon of left hip, initial encounter: Secondary | ICD-10-CM

## 2021-04-23 DIAGNOSIS — F5001 Anorexia nervosa, restricting type: Secondary | ICD-10-CM | POA: Diagnosis not present

## 2021-04-23 DIAGNOSIS — F331 Major depressive disorder, recurrent, moderate: Secondary | ICD-10-CM | POA: Diagnosis not present

## 2021-04-23 NOTE — Telephone Encounter (Signed)
Please refer to Katelyn [name].  Thanks.

## 2021-04-25 ENCOUNTER — Encounter: Payer: Self-pay | Admitting: Family Medicine

## 2021-04-26 NOTE — Telephone Encounter (Signed)
Unfortunately it seems as though local neurology group in [place] does not want to help [place].  It is possible this is because she has already seen the specialist at [facility].  Unfortunately I do not have any leverage to have the local neurology group in [place] see her.  Unfortunately that also means that she would need to follow through with going to [facility] again.  I am sympathetic to her situation but I am uncertain of what else to do for her locally because we have tried both groups.  Next option beyond [facility] would be [facility] but that would be toward [facility]  If Melda would like to do that please let us know

## 2021-04-28 DIAGNOSIS — N261 Atrophy of kidney (terminal): Secondary | ICD-10-CM

## 2021-04-28 HISTORY — DX: Atrophy of kidney (terminal): N26.1

## 2021-04-30 DIAGNOSIS — F331 Major depressive disorder, recurrent, moderate: Secondary | ICD-10-CM | POA: Diagnosis not present

## 2021-05-09 ENCOUNTER — Ambulatory Visit: Payer: PPO | Admitting: Orthopaedic Surgery

## 2021-05-10 ENCOUNTER — Ambulatory Visit (INDEPENDENT_AMBULATORY_CARE_PROVIDER_SITE_OTHER): Payer: PPO | Admitting: Orthopaedic Surgery

## 2021-05-10 ENCOUNTER — Other Ambulatory Visit: Payer: Self-pay

## 2021-05-10 ENCOUNTER — Ambulatory Visit (HOSPITAL_BASED_OUTPATIENT_CLINIC_OR_DEPARTMENT_OTHER): Payer: Self-pay | Admitting: Orthopaedic Surgery

## 2021-05-10 ENCOUNTER — Other Ambulatory Visit (HOSPITAL_BASED_OUTPATIENT_CLINIC_OR_DEPARTMENT_OTHER): Payer: Self-pay

## 2021-05-10 ENCOUNTER — Encounter (HOSPITAL_COMMUNITY): Payer: Self-pay | Admitting: Hematology

## 2021-05-10 DIAGNOSIS — S76012A Strain of muscle, fascia and tendon of left hip, initial encounter: Secondary | ICD-10-CM

## 2021-05-10 MED ORDER — ACETAMINOPHEN 500 MG PO TABS
500.0000 mg | ORAL_TABLET | Freq: Three times a day (TID) | ORAL | 0 refills | Status: AC
  Filled 2021-05-10: qty 30, 10d supply, fill #0

## 2021-05-10 MED ORDER — ENOXAPARIN SODIUM 40 MG/0.4ML IJ SOSY
40.0000 mg | PREFILLED_SYRINGE | INTRAMUSCULAR | 0 refills | Status: DC
  Filled 2021-05-10: qty 5.6, 14d supply, fill #0
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
## 2021-05-14 DIAGNOSIS — F331 Major depressive disorder, recurrent, moderate: Secondary | ICD-10-CM | POA: Diagnosis not present

## 2021-05-15 ENCOUNTER — Ambulatory Visit: Payer: PPO | Admitting: Orthopaedic Surgery

## 2021-05-16 DIAGNOSIS — M533 Sacrococcygeal disorders, not elsewhere classified: Secondary | ICD-10-CM | POA: Diagnosis not present

## 2021-05-16 DIAGNOSIS — M7918 Myalgia, other site: Secondary | ICD-10-CM | POA: Diagnosis not present

## 2021-05-16 DIAGNOSIS — M5136 Other intervertebral disc degeneration, lumbar region: Secondary | ICD-10-CM | POA: Diagnosis not present

## 2021-05-17 ENCOUNTER — Other Ambulatory Visit (HOSPITAL_COMMUNITY): Payer: Self-pay

## 2021-05-23 NOTE — Progress Notes (Signed)
Surgical Instructions    Your procedure is scheduled on Tuesday January 31st .  Report to [facility] Main Entrance "A" at 5:30 A.M., then check in with the Admitting office.  Call this number if you have problems the morning of surgery:  [phone]   If you have any questions prior to your surgery date call [phone]: Open Monday-Friday 8am-4pm    Remember:  Do not eat after midnight the night before your surgery  You may drink clear liquids until 4:30am the morning of your surgery.   Clear liquids allowed are: Water, Non-Citrus Juices (without pulp), Carbonated Beverages, Clear Tea, Black Coffee ONLY (NO MILK, CREAM OR POWDERED CREAMER of any kind), and Gatorade  Enhanced Recovery after Surgery  Enhanced Recovery after Surgery is a protocol used to improve the stress on your body and your recovery after surgery.  Patient Instructions  The day of surgery (if you do NOT have diabetes):  Drink ONE (1) Pre-Surgery Clear Ensure by ___4:30__ am the morning of surgery   This drink was given to you during your hospital  pre-op appointment visit. Nothing else to drink after completing the  Pre-Surgery Clear Ensure.         If you have questions, please contact your surgeons office.      Take these medicines the morning of surgery with A SIP OF WATER buPROPion (WELLBUTRIN) 75 MG tablet gabapentin (NEURONTIN) 300 MG capsule oxyCODONE-acetaminophen (PERCOCET) 10-325 MG tablet   IF NEEDED  albuterol (VENTOLIN HFA) 108 (90 Base) MCG/ACT inhaler - please bring with you to the hospital budesonide-formoterol (SYMBICORT) 80-4.5 MCG/ACT inhaler - please bring with you to the hospital clonazePAM (KLONOPIN) 0.5 MG tablet naloxone (NARCAN) nasal spray 4 mg/0.1 mL omeprazole (PRILOSEC) 20 MG capsule ondansetron (ZOFRAN) 8 MG tablet sucralfate (CARAFATE) 1 g tablet tiZANidine (ZANAFLEX) 4 MG tablet   Follow your surgeon's instructions on when to stop Lovenox.  If no instructions were  given by your surgeon then you will need to call the office to get those instructions.    As of today, STOP taking any Aspirin (unless otherwise instructed by your surgeon) Aleve, Naproxen, Ibuprofen, Motrin, Advil, Goody's, BC's, all herbal medications, fish oil, and all vitamins.  After your COVID test   You are not required to quarantine however you are required to wear a well-fitting mask when you are out and around people not in your household.  If your mask becomes wet or soiled, replace with a new one.  Wash your hands often with soap and water for 20 seconds or clean your hands with an alcohol-based hand sanitizer that contains at least 60% alcohol.  Do not share personal items.  Notify your provider: if you are in close contact with someone who has COVID  or if you develop a fever of 100.4 or greater, sneezing, cough, sore throat, shortness of breath or body aches.           Do not wear jewelry or makeup Do not wear lotions, powders, perfumes, or deodorant. Do not shave 48 hours prior to surgery.   Do not bring valuables to the hospital. DO Not wear nail polish, gel polish, artificial nails, or any other type of covering on natural nails (fingers and toes) If you have artificial nails or gel coating that need to be removed by a nail salon, please have this removed prior to surgery. Artificial nails or gel coating may interfere with anesthesia's ability to adequately monitor your vital signs.  [facility] is not responsible for any belongings or valuables.  Do NOT Smoke (Tobacco/Vaping)  24 hours prior to your procedure  If you use a CPAP at night, you may bring your mask for your overnight stay.   Contacts, glasses, hearing aids, dentures or partials may not be worn into surgery, please bring cases for these belongings   For patients admitted to the hospital, discharge time will be determined by your treatment team.   Patients discharged the day of surgery will  not be allowed to drive home, and someone needs to stay with them for 24 hours.  NO VISITORS WILL BE ALLOWED IN PRE-OP WHERE PATIENTS ARE PREPPED FOR SURGERY.  ONLY 1 SUPPORT PERSON MAY BE PRESENT IN THE WAITING ROOM WHILE YOU ARE IN SURGERY.  IF YOU ARE TO BE ADMITTED, ONCE YOU ARE IN YOUR ROOM YOU WILL BE ALLOWED TWO (2) VISITORS. 1 (ONE) VISITOR MAY STAY OVERNIGHT BUT MUST ARRIVE TO THE ROOM BY 8pm.  Minor children may have two parents present. Special consideration for safety and communication needs will be reviewed on a case by case basis.  Special instructions:    Oral Hygiene is also important to reduce your risk of infection.  Remember - BRUSH YOUR TEETH THE MORNING OF SURGERY WITH YOUR REGULAR TOOTHPASTE   [place]- Preparing For Surgery  Before surgery, you can play an important role. Because skin is not sterile, your skin needs to be as free of germs as possible. You can reduce the number of germs on your skin by washing with CHG (chlorahexidine gluconate) Soap before surgery.  CHG is an antiseptic cleaner which kills germs and bonds with the skin to continue killing germs even after washing.     Please do not use if you have an allergy to CHG or antibacterial soaps. If your skin becomes reddened/irritated stop using the CHG.  Do not shave (including legs and underarms) for at least 48 hours prior to first CHG shower. It is OK to shave your face.  Please follow these instructions carefully.     Shower the NIGHT BEFORE SURGERY and the MORNING OF SURGERY with CHG Soap.   If you chose to wash your hair, wash your hair first as usual with your normal shampoo. After you shampoo, rinse your hair and body thoroughly to remove the shampoo.  Then [employer] and genitals (private parts) with your normal soap and rinse thoroughly to remove soap.  After that Use CHG Soap as you would any other liquid soap. You can apply CHG directly to the skin and wash gently with a scrungie or a clean  washcloth.   Apply the CHG Soap to your body ONLY FROM THE NECK DOWN.  Do not use on open wounds or open sores. Avoid contact with your eyes, ears, mouth and genitals (private parts). Wash Face and genitals (private parts)  with your normal soap.   Wash thoroughly, paying special attention to the area where your surgery will be performed.  Thoroughly rinse your body with warm water from the neck down.  DO NOT shower/wash with your normal soap after using and rinsing off the CHG Soap.  Pat yourself dry with a CLEAN TOWEL.  Wear CLEAN PAJAMAS to bed the night before surgery  Place CLEAN SHEETS on your bed the night before your surgery  DO NOT SLEEP WITH PETS.   Day of Surgery:  Take a shower with CHG soap. Wear Clean/Comfortable clothing the morning of surgery Do not apply  any deodorants/lotions.   Remember to brush your teeth WITH YOUR REGULAR TOOTHPASTE.   Please read over the following fact sheets that you were given.

## 2021-05-24 ENCOUNTER — Other Ambulatory Visit: Payer: Self-pay

## 2021-05-24 ENCOUNTER — Encounter (HOSPITAL_COMMUNITY): Payer: Self-pay

## 2021-05-24 ENCOUNTER — Encounter (HOSPITAL_COMMUNITY)
Admission: RE | Admit: 2021-05-24 | Discharge: 2021-05-24 | Disposition: A | Discharge status: Home / Self Care | Payer: PPO | Source: Ambulatory Visit | Attending: Orthopaedic Surgery | Admitting: Orthopaedic Surgery

## 2021-05-24 VITALS — BP 136/94 | HR 81 | Temp 98.4°F | Resp 18 | Ht 65.5 in | Wt 203.6 lb

## 2021-05-24 DIAGNOSIS — Z20822 Contact with and (suspected) exposure to covid-19: Secondary | ICD-10-CM | POA: Insufficient documentation

## 2021-05-24 DIAGNOSIS — G4733 Obstructive sleep apnea (adult) (pediatric): Secondary | ICD-10-CM | POA: Insufficient documentation

## 2021-05-24 DIAGNOSIS — I495 Sick sinus syndrome: Secondary | ICD-10-CM

## 2021-05-24 DIAGNOSIS — Z01818 Encounter for other preprocedural examination: Secondary | ICD-10-CM | POA: Diagnosis not present

## 2021-05-24 HISTORY — DX: Cardiac arrhythmia, unspecified: I49.9

## 2021-05-24 HISTORY — DX: Gastro-esophageal reflux disease without esophagitis: K21.9

## 2021-05-24 LAB — BASIC METABOLIC PANEL
Anion gap: 9 (ref 5–15)
BUN: 12 mg/dL (ref 6–20)
CO2: 28 mmol/L (ref 22–32)
Calcium: 9.2 mg/dL (ref 8.9–10.3)
Chloride: 102 mmol/L (ref 98–111)
Creatinine, Ser: 0.79 mg/dL (ref 0.44–1.00)
GFR, Estimated: >60 mL/min (ref >60)
Glucose, Bld: 105 mg/dL — ABNORMAL HIGH (ref 70–99)
Potassium: 3.8 mmol/L (ref 3.5–5.1)
Sodium: 139 mmol/L (ref 135–145)

## 2021-05-24 LAB — CBC
HCT: 46.9 % — ABNORMAL HIGH (ref 36.0–46.0)
Hemoglobin: 15.1 g/dL — ABNORMAL HIGH (ref 12.0–15.0)
MCH: 31.8 pg (ref 26.0–34.0)
MCHC: 32.2 g/dL (ref 30.0–36.0)
MCV: 98.7 fL (ref 80.0–100.0)
Platelets: 362 10*3/uL (ref 150–400)
RBC: 4.75 MIL/uL (ref 3.87–5.11)
RDW: 13.7 % (ref 11.5–15.5)
WBC: 8.8 10*3/uL (ref 4.0–10.5)
nRBC: 0 % (ref 0.0–0.2)

## 2021-05-24 LAB — SARS CORONAVIRUS 2 (TAT 6-24 HRS): SARS Coronavirus 2: NEGATIVE

## 2021-05-24 NOTE — Progress Notes (Signed)
PCP - Dr. Sallee Lange Cardiologist - denies  PPM/ICD - denies   Chest x-ray - 08/04/17 EKG - 05/24/21 at PAT Stress Test - denies ECHO - 02/03/18 Cardiac Cath - denies  Sleep Study - 09/17/2008- pt was diagnosed with mild OSA (per note, no positive pressure interventions needed) CPAP - denies  DM- denies  Blood Thinner Instructions: n/a Aspirin Instructions: n/a  ERAS Protcol - yes PRE-SURGERY Ensure given at PAT  COVID TEST- 05/24/21 at PAT   Anesthesia review: yes, cardiac hx- spoke with Jeneen Rinks, Anesthesia PA, about pt  Patient denies shortness of breath, fever, cough and chest pain at PAT appointment   All instructions explained to the patient, with a verbal understanding of the material. Patient agrees to go over the instructions while at home for a better understanding. Patient also instructed to wear a mask in public after being tested for COVID-19. The opportunity to ask questions was provided.

## 2021-05-27 DIAGNOSIS — F331 Major depressive disorder, recurrent, moderate: Secondary | ICD-10-CM | POA: Diagnosis not present

## 2021-05-27 NOTE — Anesthesia Preprocedure Evaluation (Addendum)
Anesthesia Evaluation  Patient identified by MRN, date of birth, ID band Patient awake    Reviewed: Allergy & Precautions, NPO status , Patient's Chart, lab work & pertinent test results  History of Anesthesia Complications Negative for: history of anesthetic complications  Airway Mallampati: II  TM Distance: >3 FB Neck ROM: Full    Dental no notable dental hx. (+) Dental Advisory Given, Poor Dentition   Pulmonary asthma , sleep apnea , former smoker,    Pulmonary exam normal        Cardiovascular negative cardio ROS Normal cardiovascular exam     Neuro/Psych  Headaches, Anxiety Depression negative psych ROS   GI/Hepatic Neg liver ROS, GERD  Medicated and Controlled,  Endo/Other  negative endocrine ROS  Renal/GU negative Renal ROS  negative genitourinary   Musculoskeletal negative musculoskeletal ROS (+)   Abdominal   Peds  Hematology negative hematology ROS (+)   Anesthesia Other Findings Day of surgery medications reviewed with patient.  Reproductive/Obstetrics negative OB ROS                          Anesthesia Physical Anesthesia Plan  ASA: 2  Anesthesia Plan: General   Post-op Pain Management: Tylenol PO (pre-op) and Toradol IV (intra-op)   Induction: Intravenous  PONV Risk Score and Plan: 3 and Midazolam, Scopolamine patch - Pre-op, Treatment may vary due to age or medical condition, Ondansetron and Droperidol  Airway Management Planned: Oral ETT  Additional Equipment: None  Intra-op Plan:   Post-operative Plan: Extubation in OR  Informed Consent: I have reviewed the patients History and Physical, chart, labs and discussed the procedure including the risks, benefits and alternatives for the proposed anesthesia with the patient or authorized representative who has indicated his/her understanding and acceptance.     Dental advisory given  Plan Discussed with:  CRNA  Anesthesia Plan Comments: (PAT note by Karoline Caldwell, PA-C: Patient has previously been evaluated by cardiology for palpitations and chronic left-sided chest pain.  Echo 2019 showed normal LV function, no significant valvular abnormalities.  Event monitor 2019 showed sinus rhythm with occasional PACs and PVCs, no significant arrhythmias.  Chronic left-sided chest pain felt secondary to history of breast augmentation surgery.  She was last seen by Dr. Bronson Ing 04/14/2019 advised to follow-up with cardiology on an as-needed basis.  Hx of small fiber neuropathy.   OSA (mild per pt), not on CPAP.  Preop labs reviewed, unremarkable.  EKG 05/24/2021: NSR.  Rate 76.  Echocardiogram: 01/2018 Study Conclusions  - Left ventricle: The cavity size was normal. Wall thickness was normal. Systolic function was normal. The estimated ejection fraction was in the range of 60% to 65%. Wall motion was normal; there were no regional wall motion abnormalities. Left ventricular diastolic function parameters were normal. - Aortic valve: Mildly calcified annulus. Trileaflet; mildly thickened leaflets. There was mild regurgitation. - Mitral valve: There was trivial regurgitation. - Right atrium: Central venous pressure (est): 3 mm Hg. - Atrial septum: No defect or patent foramen ovale was identified. - Tricuspid valve: There was mild regurgitation. - Pulmonary arteries: PA peak pressure: 28 mm Hg (S). - Pericardium, extracardiac: There was no pericardial effusion.  Holter Monitor: 01/2018  Sinus rhythm with occasional PACs and PVCs. There were no significant arrhythmias.  )      Anesthesia Quick Evaluation

## 2021-05-27 NOTE — Progress Notes (Signed)
Anesthesia Chart Review:  Patient has previously been evaluated by cardiology for palpitations and chronic left-sided chest pain.  Echo 2019 showed normal LV function, no significant valvular abnormalities.  Event monitor 2019 showed sinus rhythm with occasional PACs and PVCs, no significant arrhythmias.  Chronic left-sided chest pain felt secondary to history of breast augmentation surgery.  She was last seen by Dr. Bronson Ing 04/14/2019 advised to follow-up with cardiology on an as-needed basis.  Hx of small fiber neuropathy.   OSA (mild per pt), not on CPAP.  Preop labs reviewed, unremarkable.  EKG 05/24/2021: NSR.  Rate 76.  Echocardiogram: 01/2018 Study Conclusions   - Left ventricle: The cavity size was normal. Wall thickness was   normal. Systolic function was normal. The estimated ejection   fraction was in the range of 60% to 65%. Wall motion was normal;   there were no regional wall motion abnormalities. Left   ventricular diastolic function parameters were normal. - Aortic valve: Mildly calcified annulus. Trileaflet; mildly   thickened leaflets. There was mild regurgitation. - Mitral valve: There was trivial regurgitation. - Right atrium: Central venous pressure (est): 3 mm Hg. - Atrial septum: No defect or patent foramen ovale was identified. - Tricuspid valve: There was mild regurgitation. - Pulmonary arteries: PA peak pressure: 28 mm Hg (S). - Pericardium, extracardiac: There was no pericardial effusion.   Holter Monitor: 01/2018 Sinus rhythm with occasional PACs and PVCs. There were no significant arrhythmias.    Wynonia Musty [facility] [place] [facility]/Anesthesiology Phone [phone] 05/27/2021 10:33 AM

## 2021-05-28 ENCOUNTER — Encounter (HOSPITAL_COMMUNITY)
Admission: RE | Disposition: A | Discharge status: Home / Self Care | Payer: Self-pay | Source: Home / Self Care | Attending: Orthopaedic Surgery

## 2021-05-28 ENCOUNTER — Ambulatory Visit (HOSPITAL_COMMUNITY)
Admission: RE | Admit: 2021-05-28 | Discharge: 2021-05-28 | Disposition: A | Discharge status: Home / Self Care | Payer: PPO | Attending: Orthopaedic Surgery | Admitting: Orthopaedic Surgery

## 2021-05-28 ENCOUNTER — Other Ambulatory Visit: Payer: Self-pay

## 2021-05-28 ENCOUNTER — Ambulatory Visit (HOSPITAL_COMMUNITY): Payer: PPO | Admitting: Anesthesiology

## 2021-05-28 ENCOUNTER — Encounter (HOSPITAL_COMMUNITY): Payer: Self-pay | Admitting: Orthopaedic Surgery

## 2021-05-28 ENCOUNTER — Ambulatory Visit (HOSPITAL_COMMUNITY): Payer: PPO | Admitting: Physician Assistant

## 2021-05-28 DIAGNOSIS — F32A Depression, unspecified: Secondary | ICD-10-CM | POA: Insufficient documentation

## 2021-05-28 DIAGNOSIS — X58XXXA Exposure to other specified factors, initial encounter: Secondary | ICD-10-CM | POA: Insufficient documentation

## 2021-05-28 DIAGNOSIS — F419 Anxiety disorder, unspecified: Secondary | ICD-10-CM | POA: Diagnosis not present

## 2021-05-28 DIAGNOSIS — Z8711 Personal history of peptic ulcer disease: Secondary | ICD-10-CM | POA: Insufficient documentation

## 2021-05-28 DIAGNOSIS — M7062 Trochanteric bursitis, left hip: Secondary | ICD-10-CM | POA: Diagnosis not present

## 2021-05-28 DIAGNOSIS — K219 Gastro-esophageal reflux disease without esophagitis: Secondary | ICD-10-CM | POA: Insufficient documentation

## 2021-05-28 DIAGNOSIS — Z87891 Personal history of nicotine dependence: Secondary | ICD-10-CM | POA: Diagnosis not present

## 2021-05-28 DIAGNOSIS — G4730 Sleep apnea, unspecified: Secondary | ICD-10-CM | POA: Insufficient documentation

## 2021-05-28 DIAGNOSIS — J45909 Unspecified asthma, uncomplicated: Secondary | ICD-10-CM | POA: Insufficient documentation

## 2021-05-28 DIAGNOSIS — S76012A Strain of muscle, fascia and tendon of left hip, initial encounter: Secondary | ICD-10-CM | POA: Diagnosis not present

## 2021-05-28 DIAGNOSIS — G629 Polyneuropathy, unspecified: Secondary | ICD-10-CM | POA: Diagnosis not present

## 2021-05-28 HISTORY — PX: GLUTEUS MINIMUS REPAIR: SHX5843

## 2021-05-28 LAB — POCT PREGNANCY, URINE: Preg Test, Ur: NEGATIVE

## 2021-05-28 SURGERY — REPAIR, TENDON, GLUTEUS MINIMUS
Anesthesia: General | Site: Hip | Laterality: Left

## 2021-05-28 MED ORDER — ONDANSETRON HCL 4 MG/2ML IJ SOLN
INTRAMUSCULAR | Status: AC
  Filled 2021-05-28: qty 2

## 2021-05-28 MED ORDER — LACTATED RINGERS IV SOLN: INTRAVENOUS | Status: DC

## 2021-05-28 MED ORDER — FENTANYL CITRATE (PF) 100 MCG/2ML IJ SOLN
25.0000 ug | INTRAMUSCULAR | Status: DC | PRN
  Administered 2021-05-28 (×2): 50 ug via INTRAVENOUS
  Administered 2021-05-28 (×2): 25 ug via INTRAVENOUS

## 2021-05-28 MED ORDER — OXYCODONE HCL 10 MG PO TABS: 10.0000 mg | ORAL_TABLET | ORAL | 0 refills | Status: DC

## 2021-05-28 MED ORDER — DEXAMETHASONE SODIUM PHOSPHATE 10 MG/ML IJ SOLN
INTRAMUSCULAR | Status: AC
  Filled 2021-05-28: qty 1

## 2021-05-28 MED ORDER — FENTANYL CITRATE (PF) 250 MCG/5ML IJ SOLN
INTRAMUSCULAR | Status: DC | PRN
  Administered 2021-05-28: 50 ug via INTRAVENOUS
  Administered 2021-05-28: 100 ug via INTRAVENOUS
  Administered 2021-05-28 (×2): 50 ug via INTRAVENOUS

## 2021-05-28 MED ORDER — PROPOFOL 10 MG/ML IV BOLUS
INTRAVENOUS | Status: DC | PRN
  Administered 2021-05-28: 180 mg via INTRAVENOUS
  Administered 2021-05-28: 50 mg via INTRAVENOUS

## 2021-05-28 MED ORDER — HYDROMORPHONE HCL 1 MG/ML IJ SOLN
INTRAMUSCULAR | Status: AC
  Filled 2021-05-28: qty 1

## 2021-05-28 MED ORDER — LACTATED RINGERS IV SOLN: INTRAVENOUS | Status: DC | PRN

## 2021-05-28 MED ORDER — TRANEXAMIC ACID-NACL 1000-0.7 MG/100ML-% IV SOLN
1000.0000 mg | INTRAVENOUS | Status: AC
  Administered 2021-05-28: 1000 mg via INTRAVENOUS
  Filled 2021-05-28: qty 100

## 2021-05-28 MED ORDER — EPHEDRINE 5 MG/ML INJ
INTRAVENOUS | Status: AC
  Filled 2021-05-28: qty 5

## 2021-05-28 MED ORDER — PHENYLEPHRINE HCL-NACL 20-0.9 MG/250ML-% IV SOLN
INTRAVENOUS | Status: DC | PRN
Start: 2021-05-28 — End: 2021-05-28
  Administered 2021-05-28: 50 ug/min via INTRAVENOUS

## 2021-05-28 MED ORDER — FENTANYL CITRATE (PF) 100 MCG/2ML IJ SOLN
INTRAMUSCULAR | Status: AC
  Filled 2021-05-28: qty 2

## 2021-05-28 MED ORDER — ALBUTEROL SULFATE HFA 108 (90 BASE) MCG/ACT IN AERS
INHALATION_SPRAY | RESPIRATORY_TRACT | Status: AC
  Filled 2021-05-28: qty 6.7

## 2021-05-28 MED ORDER — PHENYLEPHRINE 40 MCG/ML (10ML) SYRINGE FOR IV PUSH (FOR BLOOD PRESSURE SUPPORT)
PREFILLED_SYRINGE | INTRAVENOUS | Status: DC | PRN
  Administered 2021-05-28: 120 ug via INTRAVENOUS
  Administered 2021-05-28: 80 ug via INTRAVENOUS
  Administered 2021-05-28: 120 ug via INTRAVENOUS
  Administered 2021-05-28: 80 ug via INTRAVENOUS

## 2021-05-28 MED ORDER — CHLORHEXIDINE GLUCONATE 0.12 % MT SOLN
15.0000 mL | Freq: Once | OROMUCOSAL | Status: AC
  Administered 2021-05-28: 15 mL via OROMUCOSAL
  Filled 2021-05-28: qty 15

## 2021-05-28 MED ORDER — FENTANYL CITRATE (PF) 250 MCG/5ML IJ SOLN
INTRAMUSCULAR | Status: AC
  Filled 2021-05-28: qty 5

## 2021-05-28 MED ORDER — PROPOFOL 10 MG/ML IV BOLUS
INTRAVENOUS | Status: AC
  Filled 2021-05-28: qty 20

## 2021-05-28 MED ORDER — OXYCODONE HCL 5 MG PO TABS
ORAL_TABLET | ORAL | Status: AC
  Filled 2021-05-28: qty 1

## 2021-05-28 MED ORDER — KETOROLAC TROMETHAMINE 30 MG/ML IJ SOLN
INTRAMUSCULAR | Status: DC | PRN
  Administered 2021-05-28 (×2): 15 mg via INTRAVENOUS

## 2021-05-28 MED ORDER — GABAPENTIN 300 MG PO CAPS
300.0000 mg | ORAL_CAPSULE | Freq: Once | ORAL | Status: DC
  Filled 2021-05-28: qty 1

## 2021-05-28 MED ORDER — MIDAZOLAM HCL 2 MG/2ML IJ SOLN
INTRAMUSCULAR | Status: AC
  Filled 2021-05-28: qty 2

## 2021-05-28 MED ORDER — ONDANSETRON HCL 4 MG/2ML IJ SOLN
INTRAMUSCULAR | Status: DC | PRN
  Administered 2021-05-28: 4 mg via INTRAVENOUS

## 2021-05-28 MED ORDER — DROPERIDOL 2.5 MG/ML IJ SOLN
INTRAMUSCULAR | Status: DC | PRN
  Administered 2021-05-28: .625 mg via INTRAVENOUS

## 2021-05-28 MED ORDER — ACETAMINOPHEN 500 MG PO TABS
1000.0000 mg | ORAL_TABLET | Freq: Once | ORAL | Status: AC
  Administered 2021-05-28: 1000 mg via ORAL
  Filled 2021-05-28: qty 2

## 2021-05-28 MED ORDER — CEFAZOLIN SODIUM-DEXTROSE 2-4 GM/100ML-% IV SOLN
2.0000 g | INTRAVENOUS | Status: AC
  Administered 2021-05-28: 2 g via INTRAVENOUS
  Filled 2021-05-28: qty 100

## 2021-05-28 MED ORDER — ROCURONIUM BROMIDE 10 MG/ML (PF) SYRINGE
PREFILLED_SYRINGE | INTRAVENOUS | Status: DC | PRN
  Administered 2021-05-28: 60 mg via INTRAVENOUS

## 2021-05-28 MED ORDER — OXYCODONE HCL 5 MG/5ML PO SOLN: 5.0000 mg | Freq: Once | ORAL | Status: AC | PRN

## 2021-05-28 MED ORDER — SCOPOLAMINE 1 MG/3DAYS TD PT72
1.0000 | MEDICATED_PATCH | Freq: Once | TRANSDERMAL | Status: DC
  Administered 2021-05-28: 1.5 mg via TRANSDERMAL
  Filled 2021-05-28: qty 1

## 2021-05-28 MED ORDER — SUGAMMADEX SODIUM 200 MG/2ML IV SOLN
INTRAVENOUS | Status: DC | PRN
  Administered 2021-05-28: 200 mg via INTRAVENOUS

## 2021-05-28 MED ORDER — SUGAMMADEX SODIUM 500 MG/5ML IV SOLN
INTRAVENOUS | Status: AC
  Filled 2021-05-28: qty 5

## 2021-05-28 MED ORDER — ROCURONIUM BROMIDE 10 MG/ML (PF) SYRINGE
PREFILLED_SYRINGE | INTRAVENOUS | Status: AC
  Filled 2021-05-28: qty 10

## 2021-05-28 MED ORDER — PROMETHAZINE HCL 25 MG/ML IJ SOLN: 6.2500 mg | INTRAMUSCULAR | Status: DC | PRN

## 2021-05-28 MED ORDER — OXYCODONE HCL 5 MG PO TABS
5.0000 mg | ORAL_TABLET | Freq: Once | ORAL | Status: AC | PRN
  Administered 2021-05-28: 5 mg via ORAL

## 2021-05-28 MED ORDER — LIDOCAINE 2% (20 MG/ML) 5 ML SYRINGE
INTRAMUSCULAR | Status: DC | PRN
  Administered 2021-05-28: 100 mg via INTRAVENOUS

## 2021-05-28 MED ORDER — EPHEDRINE SULFATE-NACL 50-0.9 MG/10ML-% IV SOSY
PREFILLED_SYRINGE | INTRAVENOUS | Status: DC | PRN
Start: 2021-05-28 — End: 2021-05-28
  Administered 2021-05-28: 5 mg via INTRAVENOUS

## 2021-05-28 MED ORDER — ORAL CARE MOUTH RINSE: 15.0000 mL | Freq: Once | OROMUCOSAL | Status: AC

## 2021-05-28 MED ORDER — PHENYLEPHRINE 40 MCG/ML (10ML) SYRINGE FOR IV PUSH (FOR BLOOD PRESSURE SUPPORT)
PREFILLED_SYRINGE | INTRAVENOUS | Status: AC
  Filled 2021-05-28: qty 10

## 2021-05-28 MED ORDER — MIDAZOLAM HCL 5 MG/5ML IJ SOLN
INTRAMUSCULAR | Status: DC | PRN
  Administered 2021-05-28: 2 mg via INTRAVENOUS

## 2021-05-28 MED ORDER — KETOROLAC TROMETHAMINE 30 MG/ML IJ SOLN
INTRAMUSCULAR | Status: AC
  Filled 2021-05-28: qty 1

## 2021-05-28 MED ORDER — 0.9 % SODIUM CHLORIDE (POUR BTL) OPTIME
TOPICAL | Status: DC | PRN
  Administered 2021-05-28: 1000 mL

## 2021-05-28 MED ORDER — ALBUTEROL SULFATE HFA 108 (90 BASE) MCG/ACT IN AERS
INHALATION_SPRAY | RESPIRATORY_TRACT | Status: DC | PRN
  Administered 2021-05-28: 2 via RESPIRATORY_TRACT

## 2021-05-28 MED ORDER — HYDROMORPHONE HCL 1 MG/ML IJ SOLN
0.2500 mg | INTRAMUSCULAR | Status: DC | PRN
  Administered 2021-05-28 (×2): 0.5 mg via INTRAVENOUS

## 2021-05-28 MED ORDER — LIDOCAINE 2% (20 MG/ML) 5 ML SYRINGE
INTRAMUSCULAR | Status: AC
  Filled 2021-05-28: qty 5

## 2021-05-28 MED ORDER — BUPIVACAINE HCL (PF) 0.25 % IJ SOLN
INTRAMUSCULAR | Status: AC
  Filled 2021-05-28: qty 30

## 2021-05-28 SURGICAL SUPPLY — 50 items
ANCH SUT 4.75 SWIVELOCK ANCH (Orthopedic Implant) ×1 IMPLANT
ANCH SUT FBRTK 1.3 2 TPE (Anchor) ×2 IMPLANT
ANCH SUT SWLK 19.1X4.75 (Anchor) ×1 IMPLANT
ANCHOR FBRTK 2.6 SUTURETAP 1.3 (Anchor) ×2 IMPLANT
ANCHOR SUT BIO SW 4.75X19.1 (Anchor) ×1 IMPLANT
BAG COUNTER SPONGE SURGICOUNT (BAG) ×3 IMPLANT
BAG SPNG CNTER NS LX DISP (BAG) ×1
COVER SURGICAL LIGHT HANDLE (MISCELLANEOUS) ×3 IMPLANT
DRAPE INCISE IOBAN 66X45 STRL (DRAPES) ×3 IMPLANT
DRAPE STERI IOBAN 125X83 (DRAPES) ×1 IMPLANT
DRAPE U-SHAPE 47X51 STRL (DRAPES) ×3 IMPLANT
DRSG AQUACEL AG ADV 3.5X 6 (GAUZE/BANDAGES/DRESSINGS) ×1 IMPLANT
ELECT REM PT RETURN 9FT ADLT (ELECTROSURGICAL) ×2
ELECTRODE REM PT RTRN 9FT ADLT (ELECTROSURGICAL) ×2 IMPLANT
GLOVE SRG 8 PF TXTR STRL LF DI (GLOVE) ×4 IMPLANT
GLOVE SURG ORTHO LTX SZ7.5 (GLOVE) ×6 IMPLANT
GLOVE SURG UNDER POLY LF SZ8 (GLOVE) ×4
GOWN STRL REUS W/ TWL LRG LVL3 (GOWN DISPOSABLE) ×2 IMPLANT
GOWN STRL REUS W/TWL LRG LVL3 (GOWN DISPOSABLE) ×2
KIT BASIN OR (CUSTOM PROCEDURE TRAY) ×3 IMPLANT
KIT TURNOVER KIT B (KITS) ×3 IMPLANT
MANIFOLD NEPTUNE II (INSTRUMENTS) ×3 IMPLANT
NDL TAPERED W/ NITINOL LOOP (MISCELLANEOUS) IMPLANT
NEEDLE TAPERED W/ NITINOL LOOP (MISCELLANEOUS) ×2 IMPLANT
NS IRRIG 1000ML POUR BTL (IV SOLUTION) ×3 IMPLANT
PACK ORTHO EXTREMITY (CUSTOM PROCEDURE TRAY) ×3 IMPLANT
PAD ARMBOARD 7.5X6 YLW CONV (MISCELLANEOUS) ×6 IMPLANT
PASSER SUT SWANSON 36MM LOOP (INSTRUMENTS) ×3 IMPLANT
SPONGE T-LAP 4X18 ~~LOC~~+RFID (SPONGE) ×6 IMPLANT
STAPLER VISISTAT 35W (STAPLE) ×3 IMPLANT
STRIP CLOSURE SKIN 1/2X4 (GAUZE/BANDAGES/DRESSINGS) ×1 IMPLANT
SUCTION FRAZIER HANDLE 10FR (MISCELLANEOUS) ×2
SUCTION TUBE FRAZIER 10FR DISP (MISCELLANEOUS) ×2 IMPLANT
SUT FIBERWIRE #2 38 T-5 BLUE (SUTURE) ×2
SUT MNCRL AB 3-0 PS2 18 (SUTURE) ×1 IMPLANT
SUT VIC AB 0 CT1 27 (SUTURE) ×4
SUT VIC AB 0 CT1 27XBRD ANBCTR (SUTURE) ×2 IMPLANT
SUT VIC AB 2-0 CT1 27 (SUTURE) ×4
SUT VIC AB 2-0 CT1 TAPERPNT 27 (SUTURE) ×4 IMPLANT
SUTURE FIBERWR #2 38 T-5 BLUE (SUTURE) ×2 IMPLANT
SUTURE TAPE TIGERLINK 1.3MM BL (SUTURE) IMPLANT
SUTURETAPE TIGERLINK 1.3MM BL (SUTURE) ×2
SYR CONTROL 10ML LL (SYRINGE) ×1 IMPLANT
SYSTEM IMPL TENODESIS LNT 4.75 (Orthopedic Implant) ×1 IMPLANT
TOWEL GREEN STERILE (TOWEL DISPOSABLE) ×3 IMPLANT
TOWEL GREEN STERILE FF (TOWEL DISPOSABLE) ×3 IMPLANT
TUBE CONNECTING 12X1/4 (SUCTIONS) ×3 IMPLANT
UNDERPAD 30X36 HEAVY ABSORB (UNDERPADS AND DIAPERS) ×3 IMPLANT
WATER STERILE IRR 1000ML POUR (IV SOLUTION) ×3 IMPLANT
YANKAUER SUCT BULB TIP NO VENT (SUCTIONS) ×1 IMPLANT

## 2021-05-28 NOTE — Op Note (Signed)
° °  Date of Surgery: 05/28/2021  INDICATIONS: Katelyn [name] is a 46 y.o.-year-old female with left gluteus medius tear failing conservative therapy.  The risk and benefits of the procedure with discussed in detail and documented in the pre-operative evaluation.  PREOPERATIVE DIAGNOSIS: 1. Left gluteus medius tear with bursitis  POSTOPERATIVE DIAGNOSIS: Same.  PROCEDURE: 1. Left hip gluteus medius repair open 2. Left hip open trochanteric bursectomy  SURGEON: Yevonne Pax MD  ASSISTANT: Raynelle Fanning, ATC; necessary for the timely completion of procedure and due to complexity of procedure.  ANESTHESIA:  general  IV FLUIDS AND URINE: See anesthesia record.  ANTIBIOTICS: Ancef 2g  ESTIMATED BLOOD LOSS: 10 mL.  IMPLANTS:  Implant Name Type Inv. Item Serial No. Manufacturer Lot No. LRB No. Used Action  ANCHOR FBRTK 2.6 SUTURETAP 1.3 - [id number] Anchor ANCHOR FBRTK 2.6 SUTURETAP 1.3  [employer] [id number] Left 2 Implanted  SYSTEM IMPL TENODESIS LNT 4.75 - [id number] Orthopedic Implant SYSTEM IMPL TENODESIS LNT 4.75  [employer] [id number] Left 1 Implanted  ANCHOR SUT BIO SW 4.75X19.1 - [id number] Anchor ANCHOR SUT BIO SW 4.75X19.1  [employer] [id number] Left 1 Implanted    DRAINS: None  CULTURES: None  COMPLICATIONS: none  DESCRIPTION OF PROCEDURE:   The patient was identified in the preoperative holding area.  Correct site was marked according to universal protocol with nursing.  She was subsequently taken back to the operating room.  Anesthesia was induced.  She was transferred over to the operating room table.  She was positioned in the lateral position with beanbag positioner with care to pad all bony prominences including the peroneal nerve and an axillary roll was placed.  Final timeout was again performed.  We started with an approach to the lateral aspect of the hip about the greater trochanter.  15 blade was used to incise through skin.  Electrocautery was used to get down to  the level of the IT band.  A nick was made in the IT band fascia and Mayo scissors were then used to extend the IT band incision.  A heavy Mayo scissors was then used to excise the trochanteric bursa.  This time the gluteus medius tendon was identified and completely avulsed from the greater trochanter.  This was mobilized with an Allis and spreading over the muscle tendon junction.  This time there is good excursion of the tendon.  2 knotless medial row anchors were then placed.  These were then brought up through the musculotendinous junction.  A double row type configuration was then used with 2 Lateral Row bio composite 4.75 mm suture anchors.  This provided excellent anatomic reconstruction of the footprint of the gluteus medius.  The wounds were thoroughly irrigated and closed in layers of 0 Vicryl 2-0 Vicryl and 3-0 Monocryl.  Steri-Strips were applied.  An Aquacel dressing was placed.  All counts were correct at the end of the case.  Patient was awoken taken to PACU without complication   Raynelle Fanning ATC was necessary for opening, closing, retracting, limb positioning and overall facilitation and timely completion of the procedure.     POSTOPERATIVE PLAN: Patient will be partial weightbearing 50% on the left hip using a walker for 2 weeks.  At that point we will progress her weightbearing.  She will see me back in my office for a wound check.  She will begin physical therapy postop.  Yevonne Pax, MD 9:29 AM

## 2021-05-28 NOTE — Progress Notes (Signed)
Wasted Fentanyl 80mcg in [employer] with Verline Lema, RN as witness.

## 2021-05-28 NOTE — Brief Op Note (Signed)
° °  Brief Op Note  Date of Surgery: 05/28/2021  Preoperative Diagnosis: Left gluteus medius repair  Postoperative Diagnosis: same  Procedure: Procedure(s): Left GLUTEUS Medius REPAIR  Implants: Implant Name Type Inv. Item Serial No. Manufacturer Lot No. LRB No. Used Action  ANCHOR FBRTK 2.6 SUTURETAP 1.3 - [id number] Anchor ANCHOR FBRTK 2.6 SUTURETAP 1.3  [employer] [id number] Left 2 Implanted  SYSTEM IMPL TENODESIS LNT 4.75 - [id number] Orthopedic Implant SYSTEM IMPL TENODESIS LNT 4.75  [employer] [id number] Left 1 Implanted  ANCHOR SUT BIO SW 4.75X19.1 - [id number] Anchor ANCHOR SUT BIO SW 4.75X19.1  Rolinda Roan [id number] Left 1 Implanted    Surgeons: Surgeon(s): Vanetta Mulders, MD  Anesthesia: General    Estimated Blood Loss: See anesthesia record  Complications: None  Condition to PACU: Stable  Yevonne Pax, MD 05/28/2021 9:27 AM

## 2021-05-28 NOTE — Anesthesia Postprocedure Evaluation (Signed)
Anesthesia Post Note  Patient: Katelyn [name]  Procedure(s) Performed: Left GLUTEUS Medius REPAIR (Left: Hip)     Patient location during evaluation: PACU Anesthesia Type: General Level of consciousness: awake and alert Pain management: pain level controlled Vital Signs Assessment: post-procedure vital signs reviewed and stable Respiratory status: spontaneous breathing, nonlabored ventilation and respiratory function stable Cardiovascular status: blood pressure returned to baseline Postop Assessment: no apparent nausea or vomiting Anesthetic complications: no   No notable events documented.  Last Vitals:  Vitals:   05/28/21 1030 05/28/21 1045  BP: 123/69 106/68  Pulse: 99 (!) 102  Resp: 13 14  Temp:  36.8 C  SpO2: 96% 96%    Last Pain:  Vitals:   05/28/21 1045  TempSrc:   PainSc: [name]

## 2021-05-28 NOTE — Anesthesia Procedure Notes (Signed)
Procedure Name: Intubation Date/Time: 05/28/2021 7:48 AM Performed by: Jenne Campus, CRNA Pre-anesthesia Checklist: Patient identified, Emergency Drugs available, Suction available and Patient being monitored Patient Re-evaluated:Patient Re-evaluated prior to induction Oxygen Delivery Method: Circle System Utilized Preoxygenation: Pre-oxygenation with 100% oxygen Induction Type: IV induction Ventilation: Mask ventilation without difficulty Laryngoscope Size: Miller and 2 Grade View: Grade I Tube type: Oral Tube size: 7.0 mm Number of attempts: 1 Airway Equipment and Method: Stylet and Oral airway Placement Confirmation: ETT inserted through vocal cords under direct vision, positive ETCO2 and breath sounds checked- equal and bilateral Secured at: 21 cm Tube secured with: Tape Dental Injury: Teeth and Oropharynx as per pre-operative assessment

## 2021-05-28 NOTE — Interval H&P Note (Signed)
History and Physical Interval Note:  05/28/2021 7:30 AM  Katelyn [name]  has presented today for surgery, with the diagnosis of Left gluteus medius repair.  The various methods of treatment have been discussed with the patient and family. After consideration of risks, benefits and other options for treatment, the patient has consented to  Procedure(s): Left Goodnight (Left) as a surgical intervention.  The patient's history has been reviewed, patient examined, no change in status, stable for surgery.  I have reviewed the patient's chart and labs.  Questions were answered to the patient's satisfaction.     Vanetta Mulders

## 2021-05-28 NOTE — Discharge Instructions (Signed)
° ° °   Discharge Instructions    Attending Surgeon: Vanetta Mulders, MD Office Phone Number: [phone]   Diagnosis and Procedures:    Surgeries Performed: Left hip gluteus medius repari  Discharge Plan:    Diet: Resume usual diet. Begin with light or bland foods.  Drink plenty of fluids.  Activity:  50 percent partial weight bearing on left leg using a walker/crutches for 2 weeks, until seen at postoperative Physical Therapy visit this week. Please keep your brace locked until follow-up. You are advised to go home directly from the hospital or surgical center. Restrict your activities.  GENERAL INSTRUCTIONS: 1.  Apply ice to the wound ac tolerated. This will improve your comfort and your overall recovery following surgery.     2. Please call Dr. [name] office at [phone] with questions Monday-Friday during business hours. If no one answers, please leave a message and someone should get back to the patient within 24 hours. For emergencies please call 911 or proceed to the emergency room.   3. Patient to notify surgical team if experiences any of the following: Bowel/Bladder dysfunction, uncontrolled pain, nerve/muscle weakness, incision with increased drainage or redness, nausea/vomiting and Fever greater than 101.0 F.  Be alert for signs of infection including redness, streaking, odor, fever or chills. Be alert for excessive pain or bleeding and notify your surgeon immediately.  WOUND INSTRUCTIONS:   Leave your dressing/cast/splint in place until your post operative visit.  Keep it clean and dry.  Always keep the incision clean and dry until the staples/sutures are removed. If there is no drainage from the incision you should keep it open to air. If there is drainage from the incision you must keep it covered at all times until the drainage stops  Do not soak in a bath tub, hot tub, pool, lake or other body of water until 21 days after your surgery and your incision is  completely dry and healed.  If you have removable sutures (or staples) they must be removed 10-14 days (unless otherwise instructed) from the day of your surgery.     1)  Elevate the extremity as much as possible.  2)  Keep the dressing clean and dry.  3)  Please call us if the dressing becomes wet or dirty.  4)  If you are experiencing worsening pain or worsening swelling, please call.     MEDICATIONS: Resume all previous home medications at the previous prescribed dose and frequency unless otherwise noted Start taking the  pain medications on an as-needed basis as prescribed  Please taper down pain medication over the next week following surgery.  Ideally you should not require a refill of any narcotic pain medication.  Take pain medication with food to minimize nausea. In addition to the prescribed pain medication, you may take over-the-counter pain relievers such as Tylenol.  Do NOT take additional tylenol if your pain medication already has tylenol in it.  Take Lovenox shot daily for blood clot prevention      FOLLOWUP INSTRUCTIONS: 1. Follow up at the Physical Therapy Clinic 3-4 days following surgery. This appointment should be scheduled unless other arrangements have been made.The Physical Therapy scheduling number is [phone] if an appointment has not already been arranged.  2. Contact Dr. [name] office during office hours at [phone] or the practice after hours line at [phone] for non-emergencies. For medical emergencies call 911.   Discharge Location: Home

## 2021-05-28 NOTE — Transfer of Care (Signed)
Immediate Anesthesia Transfer of Care Note  Patient: Pryor Curia Staples  Procedure(s) Performed: Left GLUTEUS Medius REPAIR (Left: Hip)  Patient Location: PACU  Anesthesia Type:General  Level of Consciousness: awake, oriented and patient cooperative  Airway & Oxygen Therapy: Patient Spontanous Breathing and Patient connected to nasal cannula oxygen  Post-op Assessment: Report given to RN and Post -op Vital signs reviewed and stable  Post vital signs: Reviewed  Last Vitals:  Vitals Value Taken Time  BP 134/83 05/28/21 0946  Temp 36.8 C 05/28/21 0945  Pulse 108 05/28/21 0951  Resp 21 05/28/21 0951  SpO2 95 % 05/28/21 0951  Vitals shown include unvalidated device data.  Last Pain:  Vitals:   05/28/21 0615  TempSrc: Oral  PainSc: 8       Patients Stated Pain Goal: 5 (81/77/11 6579)  Complications: No notable events documented.

## 2021-05-30 ENCOUNTER — Encounter (HOSPITAL_COMMUNITY): Payer: Self-pay | Admitting: Orthopaedic Surgery

## 2021-05-31 ENCOUNTER — Encounter (HOSPITAL_BASED_OUTPATIENT_CLINIC_OR_DEPARTMENT_OTHER): Payer: Self-pay | Admitting: Physical Therapy

## 2021-05-31 ENCOUNTER — Ambulatory Visit (HOSPITAL_BASED_OUTPATIENT_CLINIC_OR_DEPARTMENT_OTHER): Payer: PPO | Attending: Orthopaedic Surgery | Admitting: Physical Therapy

## 2021-05-31 ENCOUNTER — Other Ambulatory Visit: Payer: Self-pay

## 2021-05-31 DIAGNOSIS — R262 Difficulty in walking, not elsewhere classified: Secondary | ICD-10-CM | POA: Insufficient documentation

## 2021-05-31 DIAGNOSIS — M25552 Pain in left hip: Secondary | ICD-10-CM | POA: Diagnosis not present

## 2021-05-31 DIAGNOSIS — M6281 Muscle weakness (generalized): Secondary | ICD-10-CM | POA: Insufficient documentation

## 2021-05-31 DIAGNOSIS — X58XXXA Exposure to other specified factors, initial encounter: Secondary | ICD-10-CM | POA: Diagnosis not present

## 2021-05-31 DIAGNOSIS — S76012A Strain of muscle, fascia and tendon of left hip, initial encounter: Secondary | ICD-10-CM | POA: Diagnosis not present

## 2021-05-31 DIAGNOSIS — M25652 Stiffness of left hip, not elsewhere classified: Secondary | ICD-10-CM | POA: Insufficient documentation

## 2021-05-31 NOTE — Therapy (Signed)
OUTPATIENT PHYSICAL THERAPY LOWER EXTREMITY EVALUATION   Patient Name: Katelyn [name] MRN: [id number] DOB:1975/05/18, 46 y.o., female Today's Date: 05/31/2021   PT End of Session - 05/31/21 1842     Visit Number 1    Number of Visits 25    Date for PT Re-Evaluation 08/23/21    PT Start Time 0805    PT Stop Time 0857    PT Time Calculation (min) 52 min    Activity Tolerance Patient tolerated treatment well    Behavior During Therapy [facility] for tasks assessed/performed             Past Medical History:  Diagnosis Date   Anemia    Anorexia    Anxiety    Asthma    controlled with meds   Benign juvenile melanoma    Chronic headaches    Colon polyps    found on colonoscopy 91/69/4503   Complication of anesthesia    itching after epidural for c section   Constipation    Depression    Depression    several suicide attempts, hospitaluzed in 2012 for this; hx pf ECT treatments ; pt sees Dr. Adele Schilder pyschiatrist  and doing well on medication   Dysrhythmia    hx of PVC's (pt hasn't seen cardiologist since 2021- no follow up needed)   GERD (gastroesophageal reflux disease)    Headache    Heart murmur    as a child - no one mentions hearing murmur anymore, sometimes it is heard. Echo has been performed 02/03/18   Heartburn    no meds   History of pneumonia    x 3  years ago - no recent problems   Hx of blood clots    Neuromuscular disorder (HCC)    small fiber neuropathy   Obesity    Pneumonia    Polycystic ovary    takes metformin to treat   Renal atrophy, left 2023   Skin lesion    Excisional biopsy of moles - none cancerous   Sleep apnea    yrs ago - diagnosed mild sleep apnea - did not have to use cpap    Past Surgical History:  Procedure Laterality Date   BREAST CAPSULECTOMY Bilateral 12/13/2020   1 week later pt had hematoma surgery on left breast   BREAST ENHANCEMENT SURGERY Bilateral 02/11/2017   BREATH TEK H PYLORI N/A 07/22/2013   Procedure:  BREATH TEK H PYLORI;  Surgeon: Edward Jolly, MD;  Location: [facility];  Service: General;  Laterality: N/A;   CESAREAN SECTION  2004, 2007   x 2   COLONOSCOPY  2013   CYSTOSCOPY WITH URETHRAL DILATATION  age 36    GASTRIC ROUX-EN-Y N/A 10/31/2013   Procedure: LAPAROSCOPIC ROUX-EN-Y GASTRIC BYPASS WITH UPPER ENDOSCOPY;  Surgeon: Edward Jolly, MD;  Location: [facility];  Service: General;  Laterality: N/A;   GLUTEUS MINIMUS REPAIR Left 05/28/2021   Procedure: Left [place];  Surgeon: Vanetta Mulders, MD;  Location: [facility];  Service: Orthopedics;  Laterality: Left;   ingrown toenail Right    Ingrown nail on great right toe   kidney stent  08/28/2014   mole excision     "benign juvenile melanoma" removed from left leg - inner thigh   POLYPECTOMY     2013   SKIN LESION EXCISION     back   TONSILLECTOMY  age 50   TUBAL LIGATION  2018   Patient Active Problem List   Diagnosis Date Noted  Tear of left gluteus medius tendon    Vitamin B 12 deficiency 10/16/2019   Elevated LFTs 10/16/2019   Asthma, exogenous, unspecified asthma severity, uncomplicated 44/04/270   Multiple environmental allergies 08/29/2018   B12 deficiency anemia 06/21/2018   Venous insufficiency 04/23/2018   Onychomycosis of toenail 04/17/2018   Chronic myofascial pain 03/18/2018   Small fiber neuropathy 03/18/2018   Idiopathic small fiber sensory neuropathy 02/23/2018   Varicose veins of both lower extremities 01/13/2018   Generalized pain 10/16/2017   Irregular menses 10/16/2017   Cervical spondylosis without myelopathy 08/28/2017   Migraine with aura and without status migrainosus, not intractable 07/22/2017   Lumbar degenerative disc disease 07/20/2017   Spondylosis of lumbar spine 07/20/2017   Coccydynia 03/12/2017   Bariatric surgery status 05/02/2016   Major depressive disorder, recurrent episode, moderate ([place]) 02/18/2016   Tachy-brady syndrome ([place]) 12/19/2015   [place]  vulvovaginitis 10/15/2015   Iron deficiency anemia 06/10/2015   Sleep disorder 06/01/2013   Generalized anxiety disorder 05/30/2013   Chronic headaches 07/01/2012   Depression, major, recurrent, severe with psychosis ([place]) 06/17/2012   Fatigue 03/05/2012   Family hx colonic polyps 03/03/2012   Eating disorder 12/18/2011   POLYCYSTIC OVARIAN DISEASE 09/01/2008   Intermittent left-sided chest pain 08/02/2008    PCP: Kathyrn Drown, MD  REFERRING PROVIDER: Vanetta Mulders, MD  REFERRING DIAG: S76.012A (ICD-10-CM) - Tear of left gluteus medius tendon, initial encounter  THERAPY DIAG:  Pain in left hip  Muscle weakness (generalized)  Stiffness of left hip, not elsewhere classified  Difficulty in walking, not elsewhere classified  ONSET DATE: 05/28/2021   SUBJECTIVE:   SUBJECTIVE STATEMENT: Pain began approx 3 years ago.  Pt had a fall on her L side in the driveway in 5366 and thinks that may have started the pain but not sure.  Pt has received PT in the past including dry needling and aquatic therapy and didn't receive relief.   Pt received a steroid injection and reports improved pain for 6 weeks.  She states she then had a stomach ulcer due to the steroid and was unable to have anymore injections.  Pt had a MRI prior to surgery which showed a nearly complete gluteus medius tear with no muscle atrophy per MD note.   Pt underwent Left hip gluteus medius repair open and open trochanteric bursectomy 05/28/2021.  Op note indicate the postoperative plan is to be partial weightbearing 50% on the left hip using a walker for 2 weeks and at that point we will progress her weightbearing.  Pt to return to MD for a wound check.  She will begin physical therapy postop.   Pt reports she is limited with dressing and bathing.  Pt is limited with ambulation distance.  She has to sleep on her stomach for years since 2018 due to small fiber neuropathy.  She has difficulty with bed mobility including  rolling over and is unable to lift L LE with supine/sit transfers.  Pt is limited with standing duration and unable to perform household chores.  Pt has difficulty with car transfers.   Pt has been unable to perform stairs for awhile and primarily stays on 1st floor.  She has been using ice.   Pt reports she developed small fiber neuropathy in 2018 after a breast surgery.  She is unable to sit and lie supine due to pain and has pain on entire L side UE and LE due to small fiber neuropathy.  Pt states she has pelvic floor  therapy for 2 years.   [name] mother present during evaluation.   PERTINENT HISTORY: -Left hip gluteus medius repair open and open trochanteric bursectomy 05/28/2021. -Anemia, Depression, anxiety, Hx of blood clots in 2018 , small fiber neuropathy, anorexia, Breast reconstruction surgery in August 2022, gastric bypass surgery in 2015   PAIN:  Are you having pain? Yes NPRS scale: 9/10 current, >10/10, 4-5/10 best prior to surgery Pain location: L lateral hip and down lateral LE to ankle   PRECAUTIONS: Other: surgical protocol; small fiber neuropathy   WEIGHT BEARING RESTRICTIONS Yes PWB'ing  FALLS:  Has patient fallen in last 6 months? No  LIVING ENVIRONMENT: Lives with: lives with their family; husband and son Lives in: House/apartment; 2 story home, but stays on 1st floor Stairs: Yes; 2 steps to enter home without rail Has following equipment at home: Single point cane, Walker - 2 wheeled, and Crutches  OCCUPATION: Pt is on disability  PLOF: Independent; Pt ambulated without an AD.  Husband and son did most of the chores but she performed light chores.  Pt hasn't been able to perform stairs for awhile.  PATIENT GOALS pt wants to be able to lie on her side, dress independently, reduce pain, return to normal walking.  Wants to be able to perform stairs.    OBJECTIVE:   DIAGNOSTIC FINDINGS: MRI prior to surgery  PATIENT SURVEYS:  Pt not in FOTO yet but will be  emailed to pt.   COGNITION:  Overall cognitive status: Within functional limits for tasks assessed   OBSERVATION: Pt stands during evaluation due to not being able to sit due to small fiber neuropathy pain.  Bilat distal LE's feel cold which pt and mother states is normal.  Pt has increased purple coloring in L foot though has been standing for awhile due to being unable to sit. Her L LE coloring improved when she was in [place], PT performed dressing change.  PT removed dressing and xeroform gauze and steri strips came off.  Pt had very little blood drainage.  Incision intact.  Pt had no signs of infection.  PT applied gauze and tegaderm over incision.   SENSATION:  Light touch: 2+ t/o R LE and L anterior thigh; 3+ in L lateral hip; 1+ distal medial and lateral LE   PALPATION: L lateral hip and distal ITB; minimally in anterior hip  LE AROM/PROM:  A/PROM Right 05/31/2021 Left 05/31/2021  Hip flexion 103   Hip extension    Hip abduction    Hip adduction    Hip internal rotation    Hip external rotation    Knee flexion 132   Knee extension Lacking 3 deg   Ankle dorsiflexion    Ankle plantarflexion    Ankle inversion    Ankle eversion     (Blank rows = not tested)  LE STRENGTH: Pt has a good quad set on R and poor quad set on L.    FUNCTIONAL TESTS:  Pt required assistance to perform bed mobility and supine/sit transfers including PT lifting L LE.    GAIT: Comments: Pt ambulated into facility with bilat crutches with a step to gait pattern.  Pt has decreasing Wb'ing thru R LE.  She is not applying much weight thru R LE primarily toe touching though does report pain.  Pt has a walker and mother brought it in during Rx.  Pt then ambulated with FWW with improved gait and reduced pain after Rx.   Pt reports feeling  better ambulating with FWW.      TODAY'S TREATMENT: Pt performed ankle pumps approx 15 reps in supine.  Pt received a HEP handout.  See below for  pt education.  Gait training with PWB with walker and with crutches.     PATIENT EDUCATION:  Education details: Educated pt in [place] precautions and instructed pt to decrease her [place] if she is having pain.  Gait training.  Instructed pt in using FWW and to use FWW.  Educated pt in post op and protocol expectations and restrictions.  Educated pt in dx, relevant anatomy, and POC.    Person educated: Patient and mother Education method: Explanation, Demonstration, Verbal cues, and Handouts Education comprehension: verbalized understanding, returned demonstration, verbal cues required, and needs further education   HOME EXERCISE PROGRAM: Access Code: [email or web address] URL: [email or web address] Date: 05/31/2021 Prepared by: Ronny Flurry  Exercises ANKLE PUMPS - 3 x daily - 7 x weekly - 2-3 sets - 10 reps   ASSESSMENT:  CLINICAL IMPRESSION: Patient is a 46 y.o. female 3 days s/p Left hip gluteus medius repair open and open trochanteric bursectomy presenting to the clinic with expected post op findings of L hip pain, muscle weakness in L LE, limited ROM in L hip, and difficulty walking.  Pt is very limited with her functional mobility including ambulation, transfers, and bed mobility. She is limited with her self care activities including dressing and bathing and unable to perform household chores.  Pt has been unable to perform stairs for awhile and primarily stays on 1st floor.  Pt is PWB'ing.  Pt is limited with certain positions since 2018 due to small fiber neuropathy including being unable to lie on back and also sit.  Patient should benefit from skilled PT to address above impairments and improve overall function.      Objective impairments include Abnormal gait, decreased activity tolerance, decreased endurance, decreased mobility, difficulty walking, decreased ROM, decreased strength, hypomobility, impaired flexibility, and pain. These impairments are limiting patient from  cleaning, community activity, driving, meal prep, shopping, and ambulation . Personal factors including 3+ comorbidities: Depression, anxiety, small fiber neuropathy and limited with positions due to pain  are also affecting patient's functional outcome.   REHAB POTENTIAL: Good  CLINICAL DECISION MAKING: Evolving/moderate complexity  EVALUATION COMPLEXITY: Moderate   GOALS:   SHORT TERM GOALS:  STG Name Target Date Goal status  1 Pt will be independent and compliant with HEP for improved pain, strength, and function.   Baseline:  06/28/2021 INITIAL  2 Pt will progress [place] per MD orders without adverse effects.  Baseline:  06/28/2021 INITIAL  3 Pt will progress with passive hip motion per protocol for improved stiffness and mobility.  Baseline: 07/12/2021 INITIAL  4 Pt will be able to perform car transfers without difficulty Baseline: 07/26/2021 INITIAL  5 Pt will ambulate with a normalized heel to toe gait pattern without an AD without limping. Baseline: 08/09/2021 INITIAL  6  Pt will progress with LE strengthening per protocol without adverse effects for improved tolerance with daily act's and functional mobility.  Baseline: 08/16/2021 INITIAL   LONG TERM GOALS:   LTG Name Target Date Goal status  1 Pt will be able to perform a 6 inch step up with good form and control without significant pain. Baseline: 08/23/2021 INITIAL  2 Pt will ambulate extended community distance without significant pain or limitation.  Baseline: 09/20/2021  INITIAL  3 Pt will be able to perform her ADLs and IADLs including cooking for her  family without significant pain or difficulty.  Baseline: 09/06/2021 INITIAL  4 Pt will be able to perform stairs with a reciprocal gait with 1 rail. Baseline: 09/20/2021 INITIAL  5 Pt will demo at least 4+/5 MMT strength in L hip flex, ER, knee ext, knee flex, and 4/5 hip abduction for improved tolerance with and performance of functional mobility skills Baseline: 09/20/2021  INITIAL             PLAN: PT FREQUENCY:  1x/wk initially and then progress to 2x/wk  PT DURATION: other: 14 weeks-16 weeks  PLANNED INTERVENTIONS: Therapeutic exercises, Therapeutic activity, Neuro Muscular re-education, Balance training, Gait training, Patient/Family education, Joint mobilization, Stair training, DME instructions, Aquatic Therapy, Electrical stimulation, Cryotherapy, Moist heat, Taping, Vasopneumatic device, Ultrasound, and Manual therapy  PLAN FOR NEXT SESSION: Cont per Dr. Rosebud Poles Medius [facility] and MD orders.  Pt is PWB'ing.  Gait training.  Pt states she is restricted in certain positions including sitting due to small fiber neuropathy.    Selinda Michaels III PT, DPT 05/31/21 7:12 PM

## 2021-06-03 ENCOUNTER — Telehealth (HOSPITAL_BASED_OUTPATIENT_CLINIC_OR_DEPARTMENT_OTHER): Payer: Self-pay | Admitting: Orthopaedic Surgery

## 2021-06-03 ENCOUNTER — Other Ambulatory Visit (HOSPITAL_BASED_OUTPATIENT_CLINIC_OR_DEPARTMENT_OTHER): Payer: Self-pay | Admitting: Orthopaedic Surgery

## 2021-06-03 MED ORDER — OXYCODONE HCL 10 MG PO TABS: 10.0000 mg | ORAL_TABLET | ORAL | 0 refills | Status: DC

## 2021-06-03 NOTE — Telephone Encounter (Signed)
Called Katelyn [name] back to inform her Dr. Sammuel Hines said to continue taking the Lovenox until her post op appointment, he ordered more Oxycodone to her pharmacy for pain, and the left foot swelling and discoloration is not something to be concerned.

## 2021-06-03 NOTE — Telephone Encounter (Signed)
[facility] sent Rx to new pharmacy

## 2021-06-06 DIAGNOSIS — F331 Major depressive disorder, recurrent, moderate: Secondary | ICD-10-CM | POA: Diagnosis not present

## 2021-06-07 ENCOUNTER — Encounter (HOSPITAL_COMMUNITY): Payer: Self-pay | Admitting: Hematology

## 2021-06-07 ENCOUNTER — Ambulatory Visit (HOSPITAL_BASED_OUTPATIENT_CLINIC_OR_DEPARTMENT_OTHER): Payer: PPO | Admitting: Physical Therapy

## 2021-06-07 ENCOUNTER — Other Ambulatory Visit (HOSPITAL_BASED_OUTPATIENT_CLINIC_OR_DEPARTMENT_OTHER): Payer: Self-pay

## 2021-06-07 ENCOUNTER — Other Ambulatory Visit (HOSPITAL_BASED_OUTPATIENT_CLINIC_OR_DEPARTMENT_OTHER): Payer: Self-pay | Admitting: Orthopaedic Surgery

## 2021-06-07 ENCOUNTER — Encounter (HOSPITAL_BASED_OUTPATIENT_CLINIC_OR_DEPARTMENT_OTHER): Payer: Self-pay | Admitting: Physical Therapy

## 2021-06-07 ENCOUNTER — Other Ambulatory Visit: Payer: Self-pay

## 2021-06-07 DIAGNOSIS — M25552 Pain in left hip: Secondary | ICD-10-CM | POA: Diagnosis not present

## 2021-06-07 DIAGNOSIS — S76012A Strain of muscle, fascia and tendon of left hip, initial encounter: Secondary | ICD-10-CM

## 2021-06-07 DIAGNOSIS — M25652 Stiffness of left hip, not elsewhere classified: Secondary | ICD-10-CM

## 2021-06-07 DIAGNOSIS — R262 Difficulty in walking, not elsewhere classified: Secondary | ICD-10-CM

## 2021-06-07 DIAGNOSIS — M6281 Muscle weakness (generalized): Secondary | ICD-10-CM

## 2021-06-07 MED ORDER — LIDOCAINE 5 % EX PTCH: 1.0000 | MEDICATED_PATCH | CUTANEOUS | Status: DC

## 2021-06-07 MED ORDER — LIDOCAINE 5 % EX PTCH
1.0000 | MEDICATED_PATCH | CUTANEOUS | 3 refills | Status: AC
  Filled 2021-06-07: qty 15, 15d supply, fill #0

## 2021-06-07 NOTE — Therapy (Signed)
OUTPATIENT PHYSICAL THERAPY LOWER EXTREMITYTREATMENT   Patient Name: Katelyn [name] MRN: [id number] DOB:01-Nov-1975, 46 y.o., female Today's Date: 06/07/2021   PT End of Session - 06/07/21 0858     Visit Number 2    Number of Visits 25    Date for PT Re-Evaluation 08/23/21    PT Start Time 0805    PT Stop Time 0843    PT Time Calculation (min) 38 min    Activity Tolerance Patient tolerated treatment well    Behavior During Therapy [facility] for tasks assessed/performed              Past Medical History:  Diagnosis Date   Anemia    Anorexia    Anxiety    Asthma    controlled with meds   Benign juvenile melanoma    Chronic headaches    Colon polyps    found on colonoscopy 74/94/4967   Complication of anesthesia    itching after epidural for c section   Constipation    Depression    Depression    several suicide attempts, hospitaluzed in 2012 for this; hx pf ECT treatments ; pt sees Dr. Adele Schilder pyschiatrist  and doing well on medication   Dysrhythmia    hx of PVC's (pt hasn't seen cardiologist since 2021- no follow up needed)   GERD (gastroesophageal reflux disease)    Headache    Heart murmur    as a child - no one mentions hearing murmur anymore, sometimes it is heard. Echo has been performed 02/03/18   Heartburn    no meds   History of pneumonia    x 3  years ago - no recent problems   Hx of blood clots    Neuromuscular disorder (HCC)    small fiber neuropathy   Obesity    Pneumonia    Polycystic ovary    takes metformin to treat   Renal atrophy, left 2023   Skin lesion    Excisional biopsy of moles - none cancerous   Sleep apnea    yrs ago - diagnosed mild sleep apnea - did not have to use cpap    Past Surgical History:  Procedure Laterality Date   BREAST CAPSULECTOMY Bilateral 12/13/2020   1 week later pt had hematoma surgery on left breast   BREAST ENHANCEMENT SURGERY Bilateral 02/11/2017   BREATH TEK H PYLORI N/A 07/22/2013   Procedure:  BREATH TEK H PYLORI;  Surgeon: Edward Jolly, MD;  Location: [facility];  Service: General;  Laterality: N/A;   CESAREAN SECTION  2004, 2007   x 2   COLONOSCOPY  2013   CYSTOSCOPY WITH URETHRAL DILATATION  age 41    GASTRIC ROUX-EN-Y N/A 10/31/2013   Procedure: LAPAROSCOPIC ROUX-EN-Y GASTRIC BYPASS WITH UPPER ENDOSCOPY;  Surgeon: Edward Jolly, MD;  Location: [facility];  Service: General;  Laterality: N/A;   GLUTEUS MINIMUS REPAIR Left 05/28/2021   Procedure: Left [place];  Surgeon: Vanetta Mulders, MD;  Location: [place];  Service: Orthopedics;  Laterality: Left;   ingrown toenail Right    Ingrown nail on great right toe   kidney stent  08/28/2014   mole excision     "benign juvenile melanoma" removed from left leg - inner thigh   POLYPECTOMY     2013   SKIN LESION EXCISION     back   TONSILLECTOMY  age 54   TUBAL LIGATION  2018   Patient Active Problem List   Diagnosis Date Noted  Tear of left gluteus medius tendon    Vitamin B 12 deficiency 10/16/2019   Elevated LFTs 10/16/2019   Asthma, exogenous, unspecified asthma severity, uncomplicated 83/66/2947   Multiple environmental allergies 08/29/2018   B12 deficiency anemia 06/21/2018   Venous insufficiency 04/23/2018   Onychomycosis of toenail 04/17/2018   Chronic myofascial pain 03/18/2018   Small fiber neuropathy 03/18/2018   Idiopathic small fiber sensory neuropathy 02/23/2018   Varicose veins of both lower extremities 01/13/2018   Generalized pain 10/16/2017   Irregular menses 10/16/2017   Cervical spondylosis without myelopathy 08/28/2017   Migraine with aura and without status migrainosus, not intractable 07/22/2017   Lumbar degenerative disc disease 07/20/2017   Spondylosis of lumbar spine 07/20/2017   Coccydynia 03/12/2017   Bariatric surgery status 05/02/2016   Major depressive disorder, recurrent episode, moderate ([place]) 02/18/2016   Tachy-brady syndrome ([place]) 12/19/2015   Candidal  vulvovaginitis 10/15/2015   Iron deficiency anemia 06/10/2015   Sleep disorder 06/01/2013   Generalized anxiety disorder 05/30/2013   Chronic headaches 07/01/2012   Depression, major, recurrent, severe with psychosis ([place]) 06/17/2012   Fatigue 03/05/2012   Family hx colonic polyps 03/03/2012   Eating disorder 12/18/2011   POLYCYSTIC OVARIAN DISEASE 09/01/2008   Intermittent left-sided chest pain 08/02/2008    PCP: Kathyrn Drown, MD  REFERRING PROVIDER: Kathyrn Drown, MD  REFERRING DIAG: S76.012A (ICD-10-CM) - Tear of left gluteus medius tendon, initial encounter  THERAPY DIAG:  Pain in left hip  Muscle weakness (generalized)  Stiffness of left hip, not elsewhere classified  Difficulty in walking, not elsewhere classified  ONSET DATE: 05/28/2021   SUBJECTIVE:   SUBJECTIVE STATEMENT: Ppt states she feels her incision site is burning and tearing. That is new within the last 2 days. She has been doing the ankle pumps at home but only in S/L. She still cannot sit or lay bc of that hip.    PERTINENT HISTORY: -Left hip gluteus medius repair open and open trochanteric bursectomy 05/28/2021. -Anemia, Depression, anxiety, Hx of blood clots in 2018 , small fiber neuropathy, anorexia, Breast reconstruction surgery in August 2022, gastric bypass surgery in 2015   PAIN:  Are you having pain? Yes NPRS scale: 9/10 current, >10/10, 4-5/10 best prior to surgery Pain location: L lateral hip and down lateral LE to ankle   PRECAUTIONS: Other: surgical protocol; small fiber neuropathy   WEIGHT BEARING RESTRICTIONS Yes PWB'ing   LIVING ENVIRONMENT: Lives with: lives with their family; husband and son Lives in: House/apartment; 2 story home, but stays on 1st floor Stairs: Yes; 2 steps to enter home without rail Has following equipment at home: Single point cane, Walker - 2 wheeled, and Crutches  OCCUPATION: Pt is on disability  PLOF: Independent; Pt ambulated without an AD.   Husband and son did most of the chores but she performed light chores.  Pt hasn't been able to perform stairs for awhile.  PATIENT GOALS pt wants to be able to lie on her side, dress independently, reduce pain, return to normal walking.  Wants to be able to perform stairs.    OBJECTIVE:   DIAGNOSTIC FINDINGS: MRI prior to surgery   PT performed dressing change.  Incision intact.  Pt had no signs of infection or openining.  PT applied gauze and tegaderm over incision.  TODAY'S TREATMENT:  PROM of hip into flexion and ABD  ANKLE PUMPS - 3 x daily - 7 x weekly - 2-3 sets - 10 reps Standing Gluteal Sets - 3 x daily -  7 x weekly - 4 sets - 50 reps - 5 hold Standing Quad Set - 2 x daily - 7 x weekly - 1 sets - 10 reps - 5 hold  Review of previous HEP and modifications to current   PATIENT EDUCATION:  Education details: self pain management, gate control theory, expected surgical pain, anatomy, exercise progression, DOMS expectations, muscle firing,  envelope of function, HEP, POC  Person educated: Patient and mother Education method: Explanation, Demonstration, Verbal cues, and Handouts Education comprehension: verbalized understanding, returned demonstration, verbal cues required, and needs further education   HOME EXERCISE PROGRAM: Access Code: [email or web address] URL: [email or web address] Date: 06/07/2021 Prepared by: Daleen Bo  Exercises ANKLE PUMPS - 3 x daily - 7 x weekly - 2-3 sets - 10 reps Standing Gluteal Sets - 3 x daily - 7 x weekly - 4 sets - 50 reps - 5 hold Standing Quad Set - 2 x daily - 7 x weekly - 1 sets - 10 reps - 5 hold    ASSESSMENT:  CLINICAL IMPRESSION: Pt presents today with complaint of increased pain and tightness around incision site. Wound check was performed with incision site still be dry, closed, and without erythema. Pt was able to tolerate gentle PROM in supine for initial part of treatment but performed rest of new HEP in standing. Pt  was able to tolerate gentle isometrics and standing ROM without increasing baseline pain. Per AT conversation, pt to pick up lidocaine patches and place near incision site to relieve area pain. Pt apprehensive at this time but responds well with encouragement and reinforcement of operating within protocol parameters. Plan to proceed with protocol as tolerated.   Objective impairments include Abnormal gait, decreased activity tolerance, decreased endurance, decreased mobility, difficulty walking, decreased ROM, decreased strength, hypomobility, impaired flexibility, and pain. These impairments are limiting patient from cleaning, community activity, driving, meal prep, shopping, and ambulation . Personal factors including 3+ comorbidities: Depression, anxiety, small fiber neuropathy and limited with positions due to pain  are also affecting patient's functional outcome.   REHAB POTENTIAL: Good  CLINICAL DECISION MAKING: Evolving/moderate complexity  EVALUATION COMPLEXITY: Moderate   GOALS:   SHORT TERM GOALS:  STG Name Target Date Goal status  1 Pt will be independent and compliant with HEP for improved pain, strength, and function.   Baseline:  07/05/2021 INITIAL  2 Pt will progress [place] per MD orders without adverse effects.  Baseline:  07/05/2021 INITIAL  3 Pt will progress with passive hip motion per protocol for improved stiffness and mobility.  Baseline: 07/12/2021 INITIAL  4 Pt will be able to perform car transfers without difficulty Baseline: 07/26/2021 INITIAL  5 Pt will ambulate with a normalized heel to toe gait pattern without an AD without limping. Baseline: 08/09/2021 INITIAL  6  Pt will progress with LE strengthening per protocol without adverse effects for improved tolerance with daily act's and functional mobility.  Baseline: 08/16/2021 INITIAL   LONG TERM GOALS:   LTG Name Target Date Goal status  1 Pt will be able to perform a 6 inch step up with good form and control  without significant pain. Baseline: 08/23/2021 INITIAL  2 Pt will ambulate extended community distance without significant pain or limitation.  Baseline: 09/20/2021  INITIAL  3 Pt will be able to perform her ADLs and IADLs including cooking for her family without significant pain or difficulty.  Baseline: 09/06/2021 INITIAL  4 Pt will be able to perform stairs with a reciprocal gait with 1 rail. Baseline:  09/20/2021 INITIAL  5 Pt will demo at least 4+/5 MMT strength in L hip flex, ER, knee ext, knee flex, and 4/5 hip abduction for improved tolerance with and performance of functional mobility skills Baseline: 09/20/2021 INITIAL             PLAN: PT FREQUENCY:  1x/wk initially and then progress to 2x/wk  PT DURATION: other: 14 weeks-16 weeks  PLANNED INTERVENTIONS: Therapeutic exercises, Therapeutic activity, Neuro Muscular re-education, Balance training, Gait training, Patient/Family education, Joint mobilization, Stair training, DME instructions, Aquatic Therapy, Electrical stimulation, Cryotherapy, Moist heat, Taping, Vasopneumatic device, Ultrasound, and Manual therapy  PLAN FOR NEXT SESSION: Cont per Dr. Rosebud Poles Medius [facility]air and MD orders.  Pt is PWB'ing.  Gait training.  Pt states she is restricted in certain positions including sitting due to small fiber neuropathy.   Daleen Bo PT, DPT 06/07/21 9:42 AM

## 2021-06-10 ENCOUNTER — Inpatient Hospital Stay (HOSPITAL_COMMUNITY): Payer: PPO | Attending: Hematology

## 2021-06-10 ENCOUNTER — Other Ambulatory Visit (HOSPITAL_BASED_OUTPATIENT_CLINIC_OR_DEPARTMENT_OTHER): Payer: Self-pay | Admitting: Orthopaedic Surgery

## 2021-06-10 ENCOUNTER — Telehealth (HOSPITAL_BASED_OUTPATIENT_CLINIC_OR_DEPARTMENT_OTHER): Payer: Self-pay | Admitting: Orthopaedic Surgery

## 2021-06-10 DIAGNOSIS — D509 Iron deficiency anemia, unspecified: Secondary | ICD-10-CM

## 2021-06-10 DIAGNOSIS — Z79899 Other long term (current) drug therapy: Secondary | ICD-10-CM | POA: Diagnosis not present

## 2021-06-10 DIAGNOSIS — D508 Other iron deficiency anemias: Secondary | ICD-10-CM | POA: Diagnosis not present

## 2021-06-10 DIAGNOSIS — E611 Iron deficiency: Secondary | ICD-10-CM | POA: Diagnosis not present

## 2021-06-10 DIAGNOSIS — D751 Secondary polycythemia: Secondary | ICD-10-CM | POA: Insufficient documentation

## 2021-06-10 DIAGNOSIS — K760 Fatty (change of) liver, not elsewhere classified: Secondary | ICD-10-CM | POA: Insufficient documentation

## 2021-06-10 DIAGNOSIS — D75839 Thrombocytosis, unspecified: Secondary | ICD-10-CM | POA: Diagnosis not present

## 2021-06-10 DIAGNOSIS — Z9884 Bariatric surgery status: Secondary | ICD-10-CM | POA: Insufficient documentation

## 2021-06-10 LAB — CBC WITH DIFFERENTIAL/PLATELET
Abs Immature Granulocytes: 0.09 10*3/uL — ABNORMAL HIGH (ref 0.00–0.07)
Basophils Absolute: 0.1 10*3/uL (ref 0.0–0.1)
Basophils Relative: 1 %
Eosinophils Absolute: 0.1 10*3/uL (ref 0.0–0.5)
Eosinophils Relative: 1 %
HCT: 47.3 % — ABNORMAL HIGH (ref 36.0–46.0)
Hemoglobin: 15.6 g/dL — ABNORMAL HIGH (ref 12.0–15.0)
Immature Granulocytes: 1 %
Lymphocytes Relative: 21 %
Lymphs Abs: 2 10*3/uL (ref 0.7–4.0)
MCH: 33.1 pg (ref 26.0–34.0)
MCHC: 33 g/dL (ref 30.0–36.0)
MCV: 100.2 fL — ABNORMAL HIGH (ref 80.0–100.0)
Monocytes Absolute: 0.4 10*3/uL (ref 0.1–1.0)
Monocytes Relative: 4 %
Neutro Abs: 7.1 10*3/uL (ref 1.7–7.7)
Neutrophils Relative %: 72 %
Platelets: 572 10*3/uL — ABNORMAL HIGH (ref 150–400)
RBC: 4.72 MIL/uL (ref 3.87–5.11)
RDW: 13.5 % (ref 11.5–15.5)
WBC: 9.8 10*3/uL (ref 4.0–10.5)
nRBC: 0 % (ref 0.0–0.2)

## 2021-06-10 LAB — IRON AND TIBC
Iron: 45 ug/dL (ref 28–170)
Saturation Ratios: 14 % (ref 10.4–31.8)
TIBC: 319 ug/dL (ref 250–450)
UIBC: 274 ug/dL

## 2021-06-10 LAB — FERRITIN: Ferritin: 135 ng/mL (ref 11–307)

## 2021-06-10 MED ORDER — OXYCODONE HCL 10 MG PO TABS: 10.0000 mg | ORAL_TABLET | ORAL | 0 refills | Status: AC

## 2021-06-10 NOTE — Telephone Encounter (Signed)
Rx request 

## 2021-06-11 ENCOUNTER — Other Ambulatory Visit (HOSPITAL_COMMUNITY): Payer: Self-pay | Admitting: Psychiatry

## 2021-06-11 DIAGNOSIS — F331 Major depressive disorder, recurrent, moderate: Secondary | ICD-10-CM

## 2021-06-11 DIAGNOSIS — F509 Eating disorder, unspecified: Secondary | ICD-10-CM

## 2021-06-11 DIAGNOSIS — F411 Generalized anxiety disorder: Secondary | ICD-10-CM

## 2021-06-11 DIAGNOSIS — F5101 Primary insomnia: Secondary | ICD-10-CM

## 2021-06-12 ENCOUNTER — Ambulatory Visit (INDEPENDENT_AMBULATORY_CARE_PROVIDER_SITE_OTHER): Payer: PPO | Admitting: Orthopaedic Surgery

## 2021-06-12 ENCOUNTER — Other Ambulatory Visit: Payer: Self-pay

## 2021-06-12 DIAGNOSIS — S76012A Strain of muscle, fascia and tendon of left hip, initial encounter: Secondary | ICD-10-CM

## 2021-06-12 NOTE — Progress Notes (Signed)
Virtual Visit via Telephone Note [facility]  I connected with Katelyn [name]  on 06/13/21 at  8:52 AM by telephone and verified that I am speaking with the correct person using two identifiers.  Location: Patient: Home Provider: [facility]   I discussed the limitations, risks, security and privacy concerns of performing an evaluation and management service by telephone and the availability of in person appointments. I also discussed with the patient that there may be a patient responsible charge related to this service. The patient expressed understanding and agreed to proceed.   HISTORY OF PRESENT ILLNESS: [name] follows at our clinic for iron deficiency and B12 deficiency secondary to malabsorption from gastric bypass surgery and chronic PPI use.  She was last evaluated by Katelyn Abernethy PA-C via telemedicine visit on 03/07/2021.  She received IV Venofer 200 mg x 5 from 03/15/2021 through 04/19/2021.  She recently underwent surgical repair of left gluteus medius tear.  She is still having some ongoing left hip pain.  She reports that her energy is "okay," considering that she has ongoing chronic fatigue and also recently underwent left hip surgery.  She did not notice any significant improvement in her energy after her IV iron in December 2022, but did note that she feels lightheaded less frequently.  She has not had any abnormal bleeding such as hematemesis, hematochezia, melena, or epistaxis. She reports that her menstrual periods are unchanged.   She denies any pica, restless legs, chest pain, dyspnea on exertion, or syncope.  Patient has other complaints unrelated to today's visit, further described in ROS. She is not currently taking any dietary vitamin or mineral supplements.   She has 25% energy and 100% appetite.  She is eating well to maintain a stable weight at this time.    OBSERVATIONS/OBJECTIVE: Review of  Systems  Constitutional:  Positive for malaise/fatigue. Negative for chills, diaphoresis, fever and weight loss.  Respiratory:  Negative for cough and shortness of breath.   Cardiovascular:  Negative for chest pain and palpitations.  Gastrointestinal:  Positive for constipation and nausea. Negative for abdominal pain, blood in stool, melena and vomiting.  Genitourinary:  Positive for dysuria (burning urinatino since surgery).  Musculoskeletal:  Positive for joint pain (left hip pain after surgery).  Neurological:  Negative for dizziness and headaches.  Psychiatric/Behavioral:  Positive for depression. The patient is nervous/anxious and has insomnia.     PHYSICAL EXAM (per limitations of virtual telephone visit): The patient is alert and oriented x 3, exhibiting adequate mentation, good mood, and ability to speak in full sentences and execute sound judgement.   ASSESSMENT & PLAN: 1.  Iron deficiency state: - Secondary to malabsorption in the setting of prior gastric bypass surgery and chronic PPI use. - Patient denies any signs or symptoms of abnormal blood loss.  Menses are regular, last 3 days, but with heavy blood loss and clots per patient report.   - She reports that her surgery in August 2022 (removal of breast implants) resulted in large hematoma requiring surgical drainage - Injectafer on 01/28/2019 and 05/04/2020 - patient had a reaction after her Injectafer treatment (fever, headaches, general malaise), despite being premedicated with Benadryl, Tylenol, and Solu-Medrol.  She had previous reaction to [facility] and has multiple medication allergies. - Most recent IV iron (Venofer 200 mg x 5) from 03/15/2021 through 04/19/2021 - Most recent labs (06/10/2021): Hgb 15.6/MCV 100.2, platelets 572, ferritin 135, iron saturation 14% - PLAN: No indication  for IV iron at this time, since ferritin is at goal  (Fe > 100) - Due to history of side effects and mild reaction to Injectafer and Feraheme, we  will premedicate with steroids for any future IV iron infusions.  - Discussed with patient that flulike syndrome and malaise is a normal side effect in some patients, and does not constitute a medical allergy.  - We will recheck labs and RTC in 3 months.   2.  Macrocytosis, mild: - She has persistent mild macrocytosis - She had history of fatty infiltration of liver prior to her gastric bypass surgery (per abdominal US on 07/13/2009).  No subsequent imaging after surgery in 2015. - Additional work-up including SPEP and TSH was unremarkable.  LFTs normal.  Normal LDH, ESR, CRP, copper. - Most recent CBC (06/10/2021): MCV 100.2 - No B12 deficiency per labs from November 2022 - Most recent abdominal imaging with CT abdomen/renal stone study (10/14/2020): Unremarkable unenhanced appearance of liver, no focal lesion identified; spleen normal in size without focal abnormality - PLAN: Suspect macrocytosis possibly due to fatty liver disease.  In the absence of other hematologic abnormalities, we will continue to watch and wait at this time. - We will recheck B12, folate, methylmalonic acid, homocystine at follow-up visit in 3 months.   3.  Elevated B12 levels: - She had a persistently elevated B12 level since at least June of 2021 - She is not on any B12 supplements at this time. - Unclear etiology, I have explained to her that this is most likely an acute phase reactant.  - PLAN: We will continue to watch and wait.  No immediate cause for concern from a hematology/oncology standpoint, patient may benefit from additional work-up by other specialists in this regard. - We will recheck B12, folate, methylmalonic acid, homocystine at follow-up visit in 3 months.  4.  Erythrocytosis & thrombocytosis - Most recent CBC (06/10/2021) showed elevated hemoglobin 15.6 and elevated platelets 572 - She has had occasional intermittent thrombocytosis, likely reactive - She has had occasional intermittent  erythrocytosis - Suspect the most recent elevations are reactive following her recent surgery in February 2023 for left gluteus medius tear repair. - PLAN:  If persistent abnormalities noted at follow-up visit, we will consider additional MPN work-up.  No suspicion for MPN at this time.   FOLLOW UP INSTRUCTIONS: Labs and phone visit in 3 months    I discussed the assessment and treatment plan with the patient. The patient was provided an opportunity to ask questions and all were answered. The patient agreed with the plan and demonstrated an understanding of the instructions.   The patient was advised to call back or seek an in-person evaluation if the symptoms worsen or if the condition fails to improve as anticipated.  I provided 13 minutes of non-face-to-face time during this encounter.   Harriett Rush, PA-C 06/13/2021 9:11 AM
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
## 2021-06-13 ENCOUNTER — Encounter (HOSPITAL_BASED_OUTPATIENT_CLINIC_OR_DEPARTMENT_OTHER): Payer: Self-pay | Admitting: Orthopaedic Surgery

## 2021-06-13 ENCOUNTER — Inpatient Hospital Stay (HOSPITAL_BASED_OUTPATIENT_CLINIC_OR_DEPARTMENT_OTHER): Payer: PPO | Admitting: Physician Assistant

## 2021-06-13 DIAGNOSIS — D7589 Other specified diseases of blood and blood-forming organs: Secondary | ICD-10-CM

## 2021-06-13 DIAGNOSIS — D508 Other iron deficiency anemias: Secondary | ICD-10-CM

## 2021-06-14 ENCOUNTER — Other Ambulatory Visit: Payer: Self-pay

## 2021-06-14 ENCOUNTER — Encounter (HOSPITAL_BASED_OUTPATIENT_CLINIC_OR_DEPARTMENT_OTHER): Payer: Self-pay | Admitting: Physical Therapy

## 2021-06-14 ENCOUNTER — Ambulatory Visit (HOSPITAL_BASED_OUTPATIENT_CLINIC_OR_DEPARTMENT_OTHER): Payer: PPO | Admitting: Physical Therapy

## 2021-06-14 DIAGNOSIS — M6281 Muscle weakness (generalized): Secondary | ICD-10-CM

## 2021-06-14 DIAGNOSIS — M25652 Stiffness of left hip, not elsewhere classified: Secondary | ICD-10-CM

## 2021-06-14 DIAGNOSIS — M25552 Pain in left hip: Secondary | ICD-10-CM

## 2021-06-14 DIAGNOSIS — R262 Difficulty in walking, not elsewhere classified: Secondary | ICD-10-CM

## 2021-06-14 NOTE — Therapy (Signed)
OUTPATIENT PHYSICAL THERAPY LOWER EXTREMITYTREATMENT   Patient Name: Katelyn [name] MRN: [id number] DOB:1975-10-05, 46 y.o., female Today's Date: 06/14/2021   PT End of Session - 06/14/21 0801     Visit Number 3    Number of Visits 25    Date for PT Re-Evaluation 08/23/21    PT Start Time 0800    PT Stop Time 0840    PT Time Calculation (min) 40 min    Activity Tolerance Patient tolerated treatment well    Behavior During Therapy [facility] for tasks assessed/performed               Past Medical History:  Diagnosis Date   Anemia    Anorexia    Anxiety    Asthma    controlled with meds   Benign juvenile melanoma    Chronic headaches    Colon polyps    found on colonoscopy 72/53/6644   Complication of anesthesia    itching after epidural for c section   Constipation    Depression    Depression    several suicide attempts, hospitaluzed in 2012 for this; hx pf ECT treatments ; pt sees Dr. Adele Schilder pyschiatrist  and doing well on medication   Dysrhythmia    hx of PVC's (pt hasn't seen cardiologist since 2021- no follow up needed)   GERD (gastroesophageal reflux disease)    Headache    Heart murmur    as a child - no one mentions hearing murmur anymore, sometimes it is heard. Echo has been performed 02/03/18   Heartburn    no meds   History of pneumonia    x 3  years ago - no recent problems   Hx of blood clots    Neuromuscular disorder (HCC)    small fiber neuropathy   Obesity    Pneumonia    Polycystic ovary    takes metformin to treat   Renal atrophy, left 2023   Skin lesion    Excisional biopsy of moles - none cancerous   Sleep apnea    yrs ago - diagnosed mild sleep apnea - did not have to use cpap    Past Surgical History:  Procedure Laterality Date   BREAST CAPSULECTOMY Bilateral 12/13/2020   1 week later pt had hematoma surgery on left breast   BREAST ENHANCEMENT SURGERY Bilateral 02/11/2017   BREATH TEK H PYLORI N/A 07/22/2013   Procedure:  BREATH TEK H PYLORI;  Surgeon: Edward Jolly, MD;  Location: [facility];  Service: General;  Laterality: N/A;   CESAREAN SECTION  2004, 2007   x 2   COLONOSCOPY  2013   CYSTOSCOPY WITH URETHRAL DILATATION  age 18    GASTRIC ROUX-EN-Y N/A 10/31/2013   Procedure: LAPAROSCOPIC ROUX-EN-Y GASTRIC BYPASS WITH UPPER ENDOSCOPY;  Surgeon: Edward Jolly, MD;  Location: [facility];  Service: General;  Laterality: N/A;   GLUTEUS MINIMUS REPAIR Left 05/28/2021   Procedure: Left [place];  Surgeon: Vanetta Mulders, MD;  Location: [place];  Service: Orthopedics;  Laterality: Left;   ingrown toenail Right    Ingrown nail on great right toe   kidney stent  08/28/2014   mole excision     "benign juvenile melanoma" removed from left leg - inner thigh   POLYPECTOMY     2013   SKIN LESION EXCISION     back   TONSILLECTOMY  age 9   TUBAL LIGATION  2018   Patient Active Problem List   Diagnosis Date  Noted   Tear of left gluteus medius tendon    Vitamin B 12 deficiency 10/16/2019   Elevated LFTs 10/16/2019   Asthma, exogenous, unspecified asthma severity, uncomplicated 51/88/4166   Multiple environmental allergies 08/29/2018   B12 deficiency anemia 06/21/2018   Venous insufficiency 04/23/2018   Onychomycosis of toenail 04/17/2018   Chronic myofascial pain 03/18/2018   Small fiber neuropathy 03/18/2018   Idiopathic small fiber sensory neuropathy 02/23/2018   Varicose veins of both lower extremities 01/13/2018   Generalized pain 10/16/2017   Irregular menses 10/16/2017   Cervical spondylosis without myelopathy 08/28/2017   Migraine with aura and without status migrainosus, not intractable 07/22/2017   Lumbar degenerative disc disease 07/20/2017   Spondylosis of lumbar spine 07/20/2017   Coccydynia 03/12/2017   Bariatric surgery status 05/02/2016   Major depressive disorder, recurrent episode, moderate ([place]) 02/18/2016   Tachy-brady syndrome ([place]) 12/19/2015   Candidal  vulvovaginitis 10/15/2015   Iron deficiency anemia 06/10/2015   Sleep disorder 06/01/2013   Generalized anxiety disorder 05/30/2013   Chronic headaches 07/01/2012   Depression, major, recurrent, severe with psychosis ([place]) 06/17/2012   Fatigue 03/05/2012   Family hx colonic polyps 03/03/2012   Eating disorder 12/18/2011   POLYCYSTIC OVARIAN DISEASE 09/01/2008   Intermittent left-sided chest pain 08/02/2008    PCP: Kathyrn Drown, MD  REFERRING PROVIDER: Vanetta Mulders, MD  REFERRING DIAG: S76.012A (ICD-10-CM) - Tear of left gluteus medius tendon, initial encounter  THERAPY DIAG:  Pain in left hip  Muscle weakness (generalized)  Stiffness of left hip, not elsewhere classified  Difficulty in walking, not elsewhere classified  ONSET DATE: 05/28/2021   SUBJECTIVE:   SUBJECTIVE STATEMENT: Pt states that the hip is better but still hurting. She figured out why it felt like pulling and it has gotten better. Pt is FWB at this time per MD   PERTINENT HISTORY: -Left hip gluteus medius repair open and open trochanteric bursectomy 05/28/2021. -Anemia, Depression, anxiety, Hx of blood clots in 2018 , small fiber neuropathy, anorexia, Breast reconstruction surgery in August 2022, gastric bypass surgery in 2015   PAIN:  Are you having pain? Yes NPRS scale: 7/10 current, >10/10, 4-5/10 best prior to surgery Pain location: L lateral hip and down lateral LE to ankle   PRECAUTIONS: Other: surgical protocol; small fiber neuropathy   WEIGHT BEARING RESTRICTIONS Yes PWB'ing   LIVING ENVIRONMENT: Lives with: lives with their family; husband and son Lives in: House/apartment; 2 story home, but stays on 1st floor Stairs: Yes; 2 steps to enter home without rail Has following equipment at home: Single point cane, Walker - 2 wheeled, and Crutches  OCCUPATION: Pt is on disability  PLOF: Independent; Pt ambulated without an AD.  Husband and son did most of the chores but she performed  light chores.  Pt hasn't been able to perform stairs for awhile.  PATIENT GOALS pt wants to be able to lie on her side, dress independently, reduce pain, return to normal walking.  Wants to be able to perform stairs.    OBJECTIVE:   DIAGNOSTIC FINDINGS: MRI prior to surgery   TODAY'S TREATMENT:  PROM of hip into flexion and ABD  Standing weight shift 5s 15x Heel toe rocking 2x10  Standing HR 2x10 Standing TKE 3x10 Gait training: single crutch gait 63ft with SBA, no crutch walking with single UE support on mat table 4x laps  Review of previous HEP and modifications to current   PATIENT EDUCATION:  Education details: AD adjustment and gait, gate control  theory, anatomy, exercise progression, DOMS expectations, muscle firing,  envelope of function, HEP, POC  Person educated: Patient and mother Education method: Explanation, Demonstration, Verbal cues, and Handouts Education comprehension: verbalized understanding, returned demonstration, verbal cues required, and needs further education   HOME EXERCISE PROGRAM: Access Code: [email or web address] URL: [email or web address] Date: 06/07/2021 Prepared by: Daleen Bo  Exercises ANKLE PUMPS - 3 x daily - 7 x weekly - 2-3 sets - 10 reps Standing Gluteal Sets - 3 x daily - 7 x weekly - 4 sets - 50 reps - 5 hold Standing Quad Set - 2 x daily - 7 x weekly - 1 sets - 10 reps - 5 hold    ASSESSMENT:  CLINICAL IMPRESSION: Pt able to progress to near FWB during session for short distance as well as safely demonstrate ability to perform gait with single axillary crutch. Pt advised to walk with single crutch and wean off walker. Pt also advised to practice FWB walking along the counter at home. Pt with much better managed pain at today's session. Pt requires consistent VC for quad set during TKE as well as reducing guarding at the hip. Pt with report of reduced pain when performing standing exercise without "clenching." Plan to proceed  per protocol and practice gait with SPC at next session, if needed. HEP updated at this time  Objective impairments include Abnormal gait, decreased activity tolerance, decreased endurance, decreased mobility, difficulty walking, decreased ROM, decreased strength, hypomobility, impaired flexibility, and pain. These impairments are limiting patient from cleaning, community activity, driving, meal prep, shopping, and ambulation . Personal factors including 3+ comorbidities: Depression, anxiety, small fiber neuropathy and limited with positions due to pain  are also affecting patient's functional outcome.   REHAB POTENTIAL: Good  CLINICAL DECISION MAKING: Evolving/moderate complexity  EVALUATION COMPLEXITY: Moderate   GOALS:   SHORT TERM GOALS:  STG Name Target Date Goal status  1 Pt will be independent and compliant with HEP for improved pain, strength, and function.   Baseline:  07/12/2021 INITIAL  2 Pt will progress [place] per MD orders without adverse effects.  Baseline:  07/12/2021 INITIAL  3 Pt will progress with passive hip motion per protocol for improved stiffness and mobility.  Baseline: 07/12/2021 INITIAL  4 Pt will be able to perform car transfers without difficulty Baseline: 07/26/2021 INITIAL  5 Pt will ambulate with a normalized heel to toe gait pattern without an AD without limping. Baseline: 08/09/2021 INITIAL  6  Pt will progress with LE strengthening per protocol without adverse effects for improved tolerance with daily act's and functional mobility.  Baseline: 08/16/2021 INITIAL   LONG TERM GOALS:   LTG Name Target Date Goal status  1 Pt will be able to perform a 6 inch step up with good form and control without significant pain. Baseline: 08/23/2021 INITIAL  2 Pt will ambulate extended community distance without significant pain or limitation.  Baseline: 09/20/2021  INITIAL  3 Pt will be able to perform her ADLs and IADLs including cooking for her family without  significant pain or difficulty.  Baseline: 09/06/2021 INITIAL  4 Pt will be able to perform stairs with a reciprocal gait with 1 rail. Baseline: 09/20/2021 INITIAL  5 Pt will demo at least 4+/5 MMT strength in L hip flex, ER, knee ext, knee flex, and 4/5 hip abduction for improved tolerance with and performance of functional mobility skills Baseline: 09/20/2021 INITIAL             PLAN: PT FREQUENCY:  1x/wk initially  and then progress to 2x/wk  PT DURATION: other: 14 weeks-16 weeks  PLANNED INTERVENTIONS: Therapeutic exercises, Therapeutic activity, Neuro Muscular re-education, Balance training, Gait training, Patient/Family education, Joint mobilization, Stair training, DME instructions, Aquatic Therapy, Electrical stimulation, Cryotherapy, Moist heat, Taping, Vasopneumatic device, Ultrasound, and Manual therapy  PLAN FOR NEXT SESSION: Cont per Dr. Rosebud Poles Medius Repair and MD orders.  Pt is PWB'ing.  Gait training.  Pt states she is restricted in certain positions including sitting due to small fiber neuropathy.   Daleen Bo PT, DPT 06/14/21 8:44 AM

## 2021-06-18 ENCOUNTER — Encounter (HOSPITAL_BASED_OUTPATIENT_CLINIC_OR_DEPARTMENT_OTHER): Payer: Self-pay | Admitting: Physical Therapy

## 2021-06-18 ENCOUNTER — Ambulatory Visit (HOSPITAL_BASED_OUTPATIENT_CLINIC_OR_DEPARTMENT_OTHER): Payer: PPO | Admitting: Physical Therapy

## 2021-06-18 ENCOUNTER — Other Ambulatory Visit: Payer: Self-pay

## 2021-06-18 DIAGNOSIS — M6281 Muscle weakness (generalized): Secondary | ICD-10-CM

## 2021-06-18 DIAGNOSIS — R262 Difficulty in walking, not elsewhere classified: Secondary | ICD-10-CM

## 2021-06-18 DIAGNOSIS — M25552 Pain in left hip: Secondary | ICD-10-CM

## 2021-06-18 DIAGNOSIS — M25652 Stiffness of left hip, not elsewhere classified: Secondary | ICD-10-CM

## 2021-06-18 NOTE — Therapy (Signed)
OUTPATIENT PHYSICAL THERAPY LOWER EXTREMITYTREATMENT   Patient Name: Katelyn [name] MRN: [id number] DOB:Apr 25, 1976, 46 y.o., female Today's Date: 06/18/2021   PT End of Session - 06/18/21 0847     Visit Number 4    Number of Visits 25    Date for PT Re-Evaluation 08/23/21    PT Start Time 0847    PT Stop Time 0928    PT Time Calculation (min) 41 min    Activity Tolerance Patient tolerated treatment well    Behavior During Therapy [facility] for tasks assessed/performed               Past Medical History:  Diagnosis Date   Anemia    Anorexia    Anxiety    Asthma    controlled with meds   Benign juvenile melanoma    Chronic headaches    Colon polyps    found on colonoscopy 05/39/7673   Complication of anesthesia    itching after epidural for c section   Constipation    Depression    Depression    several suicide attempts, hospitaluzed in 2012 for this; hx pf ECT treatments ; pt sees Dr. Adele Schilder pyschiatrist  and doing well on medication   Dysrhythmia    hx of PVC's (pt hasn't seen cardiologist since 2021- no follow up needed)   GERD (gastroesophageal reflux disease)    Headache    Heart murmur    as a child - no one mentions hearing murmur anymore, sometimes it is heard. Echo has been performed 02/03/18   Heartburn    no meds   History of pneumonia    x 3  years ago - no recent problems   Hx of blood clots    Neuromuscular disorder (HCC)    small fiber neuropathy   Obesity    Pneumonia    Polycystic ovary    takes metformin to treat   Renal atrophy, left 2023   Skin lesion    Excisional biopsy of moles - none cancerous   Sleep apnea    yrs ago - diagnosed mild sleep apnea - did not have to use cpap    Past Surgical History:  Procedure Laterality Date   BREAST CAPSULECTOMY Bilateral 12/13/2020   1 week later pt had hematoma surgery on left breast   BREAST ENHANCEMENT SURGERY Bilateral 02/11/2017   BREATH TEK H PYLORI N/A 07/22/2013   Procedure:  BREATH TEK H PYLORI;  Surgeon: Edward Jolly, MD;  Location: [facility];  Service: General;  Laterality: N/A;   CESAREAN SECTION  2004, 2007   x 2   COLONOSCOPY  2013   CYSTOSCOPY WITH URETHRAL DILATATION  age 80    GASTRIC ROUX-EN-Y N/A 10/31/2013   Procedure: LAPAROSCOPIC ROUX-EN-Y GASTRIC BYPASS WITH UPPER ENDOSCOPY;  Surgeon: Edward Jolly, MD;  Location: [facility];  Service: General;  Laterality: N/A;   GLUTEUS MINIMUS REPAIR Left 05/28/2021   Procedure: Left [place];  Surgeon: Vanetta Mulders, MD;  Location: [place];  Service: Orthopedics;  Laterality: Left;   ingrown toenail Right    Ingrown nail on great right toe   kidney stent  08/28/2014   mole excision     "benign juvenile melanoma" removed from left leg - inner thigh   POLYPECTOMY     2013   SKIN LESION EXCISION     back   TONSILLECTOMY  age 74   TUBAL LIGATION  2018   Patient Active Problem List   Diagnosis Date  Noted   Tear of left gluteus medius tendon    Vitamin B 12 deficiency 10/16/2019   Elevated LFTs 10/16/2019   Asthma, exogenous, unspecified asthma severity, uncomplicated 10/22/9483   Multiple environmental allergies 08/29/2018   B12 deficiency anemia 06/21/2018   Venous insufficiency 04/23/2018   Onychomycosis of toenail 04/17/2018   Chronic myofascial pain 03/18/2018   Small fiber neuropathy 03/18/2018   Idiopathic small fiber sensory neuropathy 02/23/2018   Varicose veins of both lower extremities 01/13/2018   Generalized pain 10/16/2017   Irregular menses 10/16/2017   Cervical spondylosis without myelopathy 08/28/2017   Migraine with aura and without status migrainosus, not intractable 07/22/2017   Lumbar degenerative disc disease 07/20/2017   Spondylosis of lumbar spine 07/20/2017   Coccydynia 03/12/2017   Bariatric surgery status 05/02/2016   Major depressive disorder, recurrent episode, moderate ([place]) 02/18/2016   Tachy-brady syndrome ([place]) 12/19/2015   Candidal  vulvovaginitis 10/15/2015   Iron deficiency anemia 06/10/2015   Sleep disorder 06/01/2013   Generalized anxiety disorder 05/30/2013   Chronic headaches 07/01/2012   Depression, major, recurrent, severe with psychosis ([place]) 06/17/2012   Fatigue 03/05/2012   Family hx colonic polyps 03/03/2012   Eating disorder 12/18/2011   POLYCYSTIC OVARIAN DISEASE 09/01/2008   Intermittent left-sided chest pain 08/02/2008    PCP: Kathyrn Drown, MD  REFERRING PROVIDER: Vanetta Mulders, Md  REFERRING DIAG: [phone] (ICD-10-CM) - Tear of left gluteus medius tendon, initial encounter  THERAPY DIAG:  Pain in left hip  Muscle weakness (generalized)  Stiffness of left hip, not elsewhere classified  Difficulty in walking, not elsewhere classified  ONSET DATE: 05/28/2021   SUBJECTIVE:   SUBJECTIVE STATEMENT: Edema on lateral hip when I lay down- tried the cane but was too unsteady.    PERTINENT HISTORY: -Left hip gluteus medius repair open and open trochanteric bursectomy 05/28/2021. -Anemia, Depression, anxiety, Hx of blood clots in 2018 , small fiber neuropathy, anorexia, Breast reconstruction surgery in August 2022, gastric bypass surgery in 2015   PAIN:  Are you having pain? Yes NPRS scale: 7/10 Sharp and stabbing with certain motions Pain location: L lateral hip and down lateral LE to ankle   PRECAUTIONS: Other: surgical protocol; small fiber neuropathy   WEIGHT BEARING RESTRICTIONS Yes PWB'ing   LIVING ENVIRONMENT: Lives with: lives with their family; husband and son Lives in: House/apartment; 2 story home, but stays on 1st floor Stairs: Yes; 2 steps to enter home without rail Has following equipment at home: Single point cane, Walker - 2 wheeled, and Crutches  OCCUPATION: Pt is on disability  PLOF: Independent; Pt ambulated without an AD.  Husband and son did most of the chores but she performed light chores.  Pt hasn't been able to perform stairs for awhile.  PATIENT  GOALS pt wants to be able to lie on her side, dress independently, reduce pain, return to normal walking.  Wants to be able to perform stairs.    OBJECTIVE:   DIAGNOSTIC FINDINGS: MRI prior to surgery   TODAY'S TREATMENT:  2/21: MANUAL: edema mob in sidelying to Lt hip, ktape- edema to Rt hip around incision site Standing glut sets Standing mini squats Isometric hip extension PT holding bolster Posterior weight shift- reaching back with heel & with heel flat   PATIENT EDUCATION:  Education details: exercise form/rationale, AD use, return to sexual activity, self-edema mob at home  Person educated: Patient Education method: Explanation, Demonstration, Verbal cues, and Handouts Education comprehension: verbalized understanding, returned demonstration, verbal cues required, and needs  further education   HOME EXERCISE PROGRAM: Access Code: [email or web address] URL: [email or web address]       ASSESSMENT:  CLINICAL IMPRESSION: Worked on decreasing fluid tension under the skin around incision site which pt reported was sore- applied ktape to continue assisting with fluid mobility. Pt is aware that she tends to stand over to her Rt side to remove pressure from Lt- exercises today focused on centralizing WB and normalizing gait pattern. Will go back to 2 crutches for now to avoid leaning toward the Right side and will ween from them as she gains hip extension flexibility for proper gait pattern.   Objective impairments include Abnormal gait, decreased activity tolerance, decreased endurance, decreased mobility, difficulty walking, decreased ROM, decreased strength, hypomobility, impaired flexibility, and pain. These impairments are limiting patient from cleaning, community activity, driving, meal prep, shopping, and ambulation . Personal factors including 3+ comorbidities: Depression, anxiety, small fiber neuropathy and limited with positions due to pain  are also affecting  patient's functional outcome.   REHAB POTENTIAL: Good  CLINICAL DECISION MAKING: Evolving/moderate complexity  EVALUATION COMPLEXITY: Moderate   GOALS:   SHORT TERM GOALS:  STG Name Target Date Goal status  1 Pt will be independent and compliant with HEP for improved pain, strength, and function.   Baseline:  07/16/2021 INITIAL  2 Pt will progress [place] per MD orders without adverse effects.  Baseline:  07/16/2021 INITIAL  3 Pt will progress with passive hip motion per protocol for improved stiffness and mobility.  Baseline: 07/12/2021 INITIAL  4 Pt will be able to perform car transfers without difficulty Baseline: 07/26/2021 INITIAL  5 Pt will ambulate with a normalized heel to toe gait pattern without an AD without limping. Baseline: 08/09/2021 INITIAL  6  Pt will progress with LE strengthening per protocol without adverse effects for improved tolerance with daily act's and functional mobility.  Baseline: 08/16/2021 INITIAL   LONG TERM GOALS:   LTG Name Target Date Goal status  1 Pt will be able to perform a 6 inch step up with good form and control without significant pain. Baseline: 08/23/2021 INITIAL  2 Pt will ambulate extended community distance without significant pain or limitation.  Baseline: 09/20/2021  INITIAL  3 Pt will be able to perform her ADLs and IADLs including cooking for her family without significant pain or difficulty.  Baseline: 09/06/2021 INITIAL  4 Pt will be able to perform stairs with a reciprocal gait with 1 rail. Baseline: 09/20/2021 INITIAL  5 Pt will demo at least 4+/5 MMT strength in L hip flex, ER, knee ext, knee flex, and 4/5 hip abduction for improved tolerance with and performance of functional mobility skills Baseline: 09/20/2021 INITIAL             PLAN: PT FREQUENCY:  1x/wk initially and then progress to 2x/wk  PT DURATION: other: 14 weeks-16 weeks  PLANNED INTERVENTIONS: Therapeutic exercises, Therapeutic activity, Neuro Muscular  re-education, Balance training, Gait training, Patient/Family education, Joint mobilization, Stair training, DME instructions, Aquatic Therapy, Electrical stimulation, Cryotherapy, Moist heat, Taping, Vasopneumatic device, Ultrasound, and Manual therapy  PLAN FOR NEXT SESSION: Cont per Dr. Rosebud Poles Medius Repair and MD orders.  Unable to exercise in seated and supine. Continue edema mobs, did she trial qped at home? Hip ext in stance phase-toe off Jamaiya Tunnell C. Rida Loudin PT, DPT 06/18/21 9:51 AM

## 2021-06-20 ENCOUNTER — Ambulatory Visit (HOSPITAL_BASED_OUTPATIENT_CLINIC_OR_DEPARTMENT_OTHER): Payer: PPO | Admitting: Physical Therapy

## 2021-06-20 ENCOUNTER — Encounter (HOSPITAL_BASED_OUTPATIENT_CLINIC_OR_DEPARTMENT_OTHER): Payer: Self-pay | Admitting: Physical Therapy

## 2021-06-20 ENCOUNTER — Other Ambulatory Visit: Payer: Self-pay

## 2021-06-20 DIAGNOSIS — M25552 Pain in left hip: Secondary | ICD-10-CM

## 2021-06-20 DIAGNOSIS — M6281 Muscle weakness (generalized): Secondary | ICD-10-CM

## 2021-06-20 DIAGNOSIS — R262 Difficulty in walking, not elsewhere classified: Secondary | ICD-10-CM

## 2021-06-20 DIAGNOSIS — M25652 Stiffness of left hip, not elsewhere classified: Secondary | ICD-10-CM

## 2021-06-20 NOTE — Therapy (Signed)
OUTPATIENT PHYSICAL THERAPY LOWER EXTREMITYTREATMENT   Patient Name: Katelyn [name] MRN: [id number] DOB:1975/07/01, 46 y.o., female Today's Date: 06/20/2021   PT End of Session - 06/20/21 0841     Visit Number 5    Number of Visits 25    Date for PT Re-Evaluation 08/23/21    PT Start Time 0802    PT Stop Time 0834    PT Time Calculation (min) 32 min    Activity Tolerance Patient tolerated treatment well    Behavior During Therapy [facility] for tasks assessed/performed                Past Medical History:  Diagnosis Date   Anemia    Anorexia    Anxiety    Asthma    controlled with meds   Benign juvenile melanoma    Chronic headaches    Colon polyps    found on colonoscopy 73/42/8768   Complication of anesthesia    itching after epidural for c section   Constipation    Depression    Depression    several suicide attempts, hospitaluzed in 2012 for this; hx pf ECT treatments ; pt sees Dr. Adele Schilder pyschiatrist  and doing well on medication   Dysrhythmia    hx of PVC's (pt hasn't seen cardiologist since 2021- no follow up needed)   GERD (gastroesophageal reflux disease)    Headache    Heart murmur    as a child - no one mentions hearing murmur anymore, sometimes it is heard. Echo has been performed 02/03/18   Heartburn    no meds   History of pneumonia    x 3  years ago - no recent problems   Hx of blood clots    Neuromuscular disorder (HCC)    small fiber neuropathy   Obesity    Pneumonia    Polycystic ovary    takes metformin to treat   Renal atrophy, left 2023   Skin lesion    Excisional biopsy of moles - none cancerous   Sleep apnea    yrs ago - diagnosed mild sleep apnea - did not have to use cpap    Past Surgical History:  Procedure Laterality Date   BREAST CAPSULECTOMY Bilateral 12/13/2020   1 week later pt had hematoma surgery on left breast   BREAST ENHANCEMENT SURGERY Bilateral 02/11/2017   BREATH TEK H PYLORI N/A 07/22/2013   Procedure:  BREATH TEK H PYLORI;  Surgeon: Edward Jolly, MD;  Location: [facility];  Service: General;  Laterality: N/A;   CESAREAN SECTION  2004, 2007   x 2   COLONOSCOPY  2013   CYSTOSCOPY WITH URETHRAL DILATATION  age 43    GASTRIC ROUX-EN-Y N/A 10/31/2013   Procedure: LAPAROSCOPIC ROUX-EN-Y GASTRIC BYPASS WITH UPPER ENDOSCOPY;  Surgeon: Edward Jolly, MD;  Location: [facility];  Service: General;  Laterality: N/A;   GLUTEUS MINIMUS REPAIR Left 05/28/2021   Procedure: Left [place];  Surgeon: Vanetta Mulders, MD;  Location: [facility];  Service: Orthopedics;  Laterality: Left;   ingrown toenail Right    Ingrown nail on great right toe   kidney stent  08/28/2014   mole excision     "benign juvenile melanoma" removed from left leg - inner thigh   POLYPECTOMY     2013   SKIN LESION EXCISION     back   TONSILLECTOMY  age 67   TUBAL LIGATION  2018   Patient Active Problem List   Diagnosis  Date Noted   Tear of left gluteus medius tendon    Vitamin B 12 deficiency 10/16/2019   Elevated LFTs 10/16/2019   Asthma, exogenous, unspecified asthma severity, uncomplicated 01/60/1093   Multiple environmental allergies 08/29/2018   B12 deficiency anemia 06/21/2018   Venous insufficiency 04/23/2018   Onychomycosis of toenail 04/17/2018   Chronic myofascial pain 03/18/2018   Small fiber neuropathy 03/18/2018   Idiopathic small fiber sensory neuropathy 02/23/2018   Varicose veins of both lower extremities 01/13/2018   Generalized pain 10/16/2017   Irregular menses 10/16/2017   Cervical spondylosis without myelopathy 08/28/2017   Migraine with aura and without status migrainosus, not intractable 07/22/2017   Lumbar degenerative disc disease 07/20/2017   Spondylosis of lumbar spine 07/20/2017   Coccydynia 03/12/2017   Bariatric surgery status 05/02/2016   Major depressive disorder, recurrent episode, moderate ([place]) 02/18/2016   Tachy-brady syndrome ([place]) 12/19/2015   Candidal  vulvovaginitis 10/15/2015   Iron deficiency anemia 06/10/2015   Sleep disorder 06/01/2013   Generalized anxiety disorder 05/30/2013   Chronic headaches 07/01/2012   Depression, major, recurrent, severe with psychosis ([place]) 06/17/2012   Fatigue 03/05/2012   Family hx colonic polyps 03/03/2012   Eating disorder 12/18/2011   POLYCYSTIC OVARIAN DISEASE 09/01/2008   Intermittent left-sided chest pain 08/02/2008    PCP: Kathyrn Drown, MD  REFERRING PROVIDER: Vanetta Mulders, Md  REFERRING DIAG: [phone] (ICD-10-CM) - Tear of left gluteus medius tendon, initial encounter  THERAPY DIAG:  Pain in left hip  Muscle weakness (generalized)  Stiffness of left hip, not elsewhere classified  Difficulty in walking, not elsewhere classified  ONSET DATE: 05/28/2021   SUBJECTIVE:   SUBJECTIVE STATEMENT: She states the hip is very sore today. She states the hip is icing to reduce swelling.    PERTINENT HISTORY: -Left hip gluteus medius repair open and open trochanteric bursectomy 05/28/2021. -Anemia, Depression, anxiety, Hx of blood clots in 2018 , small fiber neuropathy, anorexia, Breast reconstruction surgery in August 2022, gastric bypass surgery in 2015   PAIN:  Are you having pain? Yes NPRS scale: 7/10 Sharp and stabbing with certain motions Pain location: L lateral hip and down lateral LE to ankle   PRECAUTIONS: Other: surgical protocol; small fiber neuropathy   WEIGHT BEARING RESTRICTIONS Yes PWB'ing   LIVING ENVIRONMENT: Lives with: lives with their family; husband and son Lives in: House/apartment; 2 story home, but stays on 1st floor Stairs: Yes; 2 steps to enter home without rail Has following equipment at home: Single point cane, Walker - 2 wheeled, and Crutches  OCCUPATION: Pt is on disability  PLOF: Independent; Pt ambulated without an AD.  Husband and son did most of the chores but she performed light chores.  Pt hasn't been able to perform stairs for  awhile.  PATIENT GOALS pt wants to be able to lie on her side, dress independently, reduce pain, return to normal walking.  Wants to be able to perform stairs.    OBJECTIVE:   DIAGNOSTIC FINDINGS: MRI prior to surgery   TODAY'S TREATMENT:  2/23:   Stand weight shift 5s 2x10 Standing glut sets 2s 2x10 Quadruped hip rocking 2s 2x10 Standing HR 2x10 Standing mini squats 3x5 Heel toe rocking with bilat ax crutch 10x Standing on foam 20s 3x LAQ 3lbs 3x10  2/21: MANUAL: edema mob in sidelying to Lt hip, ktape- edema to Rt hip around incision site Standing glut sets Standing mini squats Isometric hip extension PT holding bolster Posterior weight shift- reaching back with heel &  with heel flat   PATIENT EDUCATION:  Education details: exercise form/rationale, AD use, return to sexual activity, self-edema mob at home  Person educated: Patient Education method: Consulting civil engineer, Demonstration, Verbal cues, and Handouts Education comprehension: verbalized understanding, returned demonstration, verbal cues required, and needs further education   HOME EXERCISE PROGRAM: Access Code: [email or web address] URL: [email or web address]       ASSESSMENT:  CLINICAL IMPRESSION: Pt presents with elevated hip soreness at today's session. However, pt was able to continue with gentle ROM and WB activity at today's session. Pt is able reach 90 deg of flexion on L hip today in quadruped. [name] pain is still sensitive and irritable with movement/WB but able to perform quad resisted quad exercise in order to support L hip and reduce L LE buckling. Plan to continue per protocol. Pt able to decrease to 1x/week until 6 weeks.  Objective impairments include Abnormal gait, decreased activity tolerance, decreased endurance, decreased mobility, difficulty walking, decreased ROM, decreased strength, hypomobility, impaired flexibility, and pain. These impairments are limiting patient from cleaning, community  activity, driving, meal prep, shopping, and ambulation . Personal factors including 3+ comorbidities: Depression, anxiety, small fiber neuropathy and limited with positions due to pain  are also affecting patient's functional outcome.   REHAB POTENTIAL: Good  CLINICAL DECISION MAKING: Evolving/moderate complexity  EVALUATION COMPLEXITY: Moderate   GOALS:   SHORT TERM GOALS:  STG Name Target Date Goal status  1 Pt will be independent and compliant with HEP for improved pain, strength, and function.   Baseline:  07/18/2021 INITIAL  2 Pt will progress [place] per MD orders without adverse effects.  Baseline:  07/18/2021 INITIAL  3 Pt will progress with passive hip motion per protocol for improved stiffness and mobility.  Baseline: 07/12/2021 INITIAL  4 Pt will be able to perform car transfers without difficulty Baseline: 07/26/2021 INITIAL  5 Pt will ambulate with a normalized heel to toe gait pattern without an AD without limping. Baseline: 08/09/2021 INITIAL  6  Pt will progress with LE strengthening per protocol without adverse effects for improved tolerance with daily act's and functional mobility.  Baseline: 08/16/2021 INITIAL   LONG TERM GOALS:   LTG Name Target Date Goal status  1 Pt will be able to perform a 6 inch step up with good form and control without significant pain. Baseline: 08/23/2021 INITIAL  2 Pt will ambulate extended community distance without significant pain or limitation.  Baseline: 09/20/2021  INITIAL  3 Pt will be able to perform her ADLs and IADLs including cooking for her family without significant pain or difficulty.  Baseline: 09/06/2021 INITIAL  4 Pt will be able to perform stairs with a reciprocal gait with 1 rail. Baseline: 09/20/2021 INITIAL  5 Pt will demo at least 4+/5 MMT strength in L hip flex, ER, knee ext, knee flex, and 4/5 hip abduction for improved tolerance with and performance of functional mobility skills Baseline: 09/20/2021 INITIAL              PLAN: PT FREQUENCY:  1x/wk initially and then progress to 2x/wk  PT DURATION: other: 14 weeks-16 weeks  PLANNED INTERVENTIONS: Therapeutic exercises, Therapeutic activity, Neuro Muscular re-education, Balance training, Gait training, Patient/Family education, Joint mobilization, Stair training, DME instructions, Aquatic Therapy, Electrical stimulation, Cryotherapy, Moist heat, Taping, Vasopneumatic device, Ultrasound, and Manual therapy  PLAN FOR NEXT SESSION: Cont per Dr. Rosebud Poles Medius [employer]air and MD orders.  Unable to exercise in seated and supine.   Daleen Bo PT, DPT 06/20/21 8:43 AM

## 2021-06-24 DIAGNOSIS — F331 Major depressive disorder, recurrent, moderate: Secondary | ICD-10-CM | POA: Diagnosis not present

## 2021-06-25 ENCOUNTER — Encounter (HOSPITAL_BASED_OUTPATIENT_CLINIC_OR_DEPARTMENT_OTHER): Payer: Self-pay | Admitting: Physical Therapy

## 2021-06-25 ENCOUNTER — Other Ambulatory Visit: Payer: Self-pay

## 2021-06-25 ENCOUNTER — Ambulatory Visit (HOSPITAL_BASED_OUTPATIENT_CLINIC_OR_DEPARTMENT_OTHER): Payer: PPO | Admitting: Physical Therapy

## 2021-06-25 DIAGNOSIS — R262 Difficulty in walking, not elsewhere classified: Secondary | ICD-10-CM

## 2021-06-25 DIAGNOSIS — M6281 Muscle weakness (generalized): Secondary | ICD-10-CM

## 2021-06-25 DIAGNOSIS — M25652 Stiffness of left hip, not elsewhere classified: Secondary | ICD-10-CM

## 2021-06-25 DIAGNOSIS — M25552 Pain in left hip: Secondary | ICD-10-CM | POA: Diagnosis not present

## 2021-06-25 NOTE — Therapy (Signed)
OUTPATIENT PHYSICAL THERAPY LOWER EXTREMITYTREATMENT   Patient Name: Katelyn [name] MRN: [id number] DOB:1976-04-18, 46 y.o., female Today's Date: 06/25/2021   PT End of Session - 06/25/21 0802     Visit Number 6    Number of Visits 25    Date for PT Re-Evaluation 08/23/21    PT Start Time 0758    PT Stop Time 0837    PT Time Calculation (min) 39 min    Activity Tolerance Patient tolerated treatment well    Behavior During Therapy [facility] for tasks assessed/performed                Past Medical History:  Diagnosis Date   Anemia    Anorexia    Anxiety    Asthma    controlled with meds   Benign juvenile melanoma    Chronic headaches    Colon polyps    found on colonoscopy 70/62/3762   Complication of anesthesia    itching after epidural for c section   Constipation    Depression    Depression    several suicide attempts, hospitaluzed in 2012 for this; hx pf ECT treatments ; pt sees Dr. Adele Schilder pyschiatrist  and doing well on medication   Dysrhythmia    hx of PVC's (pt hasn't seen cardiologist since 2021- no follow up needed)   GERD (gastroesophageal reflux disease)    Headache    Heart murmur    as a child - no one mentions hearing murmur anymore, sometimes it is heard. Echo has been performed 02/03/18   Heartburn    no meds   History of pneumonia    x 3  years ago - no recent problems   Hx of blood clots    Neuromuscular disorder (HCC)    small fiber neuropathy   Obesity    Pneumonia    Polycystic ovary    takes metformin to treat   Renal atrophy, left 2023   Skin lesion    Excisional biopsy of moles - none cancerous   Sleep apnea    yrs ago - diagnosed mild sleep apnea - did not have to use cpap    Past Surgical History:  Procedure Laterality Date   BREAST CAPSULECTOMY Bilateral 12/13/2020   1 week later pt had hematoma surgery on left breast   BREAST ENHANCEMENT SURGERY Bilateral 02/11/2017   BREATH TEK H PYLORI N/A 07/22/2013   Procedure:  BREATH TEK H PYLORI;  Surgeon: Edward Jolly, MD;  Location: [facility];  Service: General;  Laterality: N/A;   CESAREAN SECTION  2004, 2007   x 2   COLONOSCOPY  2013   CYSTOSCOPY WITH URETHRAL DILATATION  age 44    GASTRIC ROUX-EN-Y N/A 10/31/2013   Procedure: LAPAROSCOPIC ROUX-EN-Y GASTRIC BYPASS WITH UPPER ENDOSCOPY;  Surgeon: Edward Jolly, MD;  Location: [facility];  Service: General;  Laterality: N/A;   GLUTEUS MINIMUS REPAIR Left 05/28/2021   Procedure: Left [place];  Surgeon: Vanetta Mulders, MD;  Location: [facility];  Service: Orthopedics;  Laterality: Left;   ingrown toenail Right    Ingrown nail on great right toe   kidney stent  08/28/2014   mole excision     "benign juvenile melanoma" removed from left leg - inner thigh   POLYPECTOMY     2013   SKIN LESION EXCISION     back   TONSILLECTOMY  age 52   TUBAL LIGATION  2018   Patient Active Problem List   Diagnosis  Date Noted   Tear of left gluteus medius tendon    Vitamin B 12 deficiency 10/16/2019   Elevated LFTs 10/16/2019   Asthma, exogenous, unspecified asthma severity, uncomplicated 76/73/4193   Multiple environmental allergies 08/29/2018   B12 deficiency anemia 06/21/2018   Venous insufficiency 04/23/2018   Onychomycosis of toenail 04/17/2018   Chronic myofascial pain 03/18/2018   Small fiber neuropathy 03/18/2018   Idiopathic small fiber sensory neuropathy 02/23/2018   Varicose veins of both lower extremities 01/13/2018   Generalized pain 10/16/2017   Irregular menses 10/16/2017   Cervical spondylosis without myelopathy 08/28/2017   Migraine with aura and without status migrainosus, not intractable 07/22/2017   Lumbar degenerative disc disease 07/20/2017   Spondylosis of lumbar spine 07/20/2017   Coccydynia 03/12/2017   Bariatric surgery status 05/02/2016   Major depressive disorder, recurrent episode, moderate ([place]) 02/18/2016   Tachy-brady syndrome ([place]) 12/19/2015   Candidal  vulvovaginitis 10/15/2015   Iron deficiency anemia 06/10/2015   Sleep disorder 06/01/2013   Generalized anxiety disorder 05/30/2013   Chronic headaches 07/01/2012   Depression, major, recurrent, severe with psychosis ([place]) 06/17/2012   Fatigue 03/05/2012   Family hx colonic polyps 03/03/2012   Eating disorder 12/18/2011   POLYCYSTIC OVARIAN DISEASE 09/01/2008   Intermittent left-sided chest pain 08/02/2008    PCP: Kathyrn Drown, MD  REFERRING PROVIDER: Vanetta Mulders, Md  REFERRING DIAG: [phone] (ICD-10-CM) - Tear of left gluteus medius tendon, initial encounter  THERAPY DIAG:  Pain in left hip  Muscle weakness (generalized)  Stiffness of left hip, not elsewhere classified  Difficulty in walking, not elsewhere classified  ONSET DATE: 05/28/2021   SUBJECTIVE:   SUBJECTIVE STATEMENT: I was able to sit at the table for the first time. Tolerated about 5 minutes.    PERTINENT HISTORY: -Left hip gluteus medius repair open and open trochanteric bursectomy 05/28/2021. -Anemia, Depression, anxiety, Hx of blood clots in 2018 , small fiber neuropathy, anorexia, Breast reconstruction surgery in August 2022, gastric bypass surgery in 2015   PAIN:  Are you having pain? Yes NPRS scale: 6/10 Sharp and stabbing with certain motions, aching at rest Pain location: anterior area of hip is a new pain today   PRECAUTIONS: Other: surgical protocol; small fiber neuropathy   WEIGHT BEARING RESTRICTIONS Yes PWB'ing   LIVING ENVIRONMENT: Lives with: lives with their family; husband and son Lives in: House/apartment; 2 story home, but stays on 1st floor Stairs: Yes; 2 steps to enter home without rail Has following equipment at home: Single point cane, Walker - 2 wheeled, and Crutches  OCCUPATION: Pt is on disability  PLOF: Independent; Pt ambulated without an AD.  Husband and son did most of the chores but she performed light chores.  Pt hasn't been able to perform stairs for  awhile.  PATIENT GOALS pt wants to be able to lie on her side, dress independently, reduce pain, return to normal walking.  Wants to be able to perform stairs.    OBJECTIVE:   DIAGNOSTIC FINDINGS: MRI prior to surgery   TODAY'S TREATMENT:  2/28: MANUAL: edema mobilization around incision site- stitch protruding at most distal end without s/s of infection & covered with bandaid Qped on shuttle single leg press #6  Standing hip flexion taps on 8' step- focus on weight shift Heel raises  2/23:   Stand weight shift 5s 2x10 Standing glut sets 2s 2x10 Quadruped hip rocking 2s 2x10 Standing HR 2x10 Standing mini squats 3x5 Heel toe rocking with bilat ax crutch 10x Standing  on foam 20s 3x LAQ 3lbs 3x10  2/21: MANUAL: edema mob in sidelying to Lt hip, ktape- edema to Rt hip around incision site Standing glut sets Standing mini squats Isometric hip extension PT holding bolster Posterior weight shift- reaching back with heel & with heel flat   PATIENT EDUCATION:  Education details: exercise form/rationale, AD use, return to sexual activity, self-edema mob at home  Person educated: Patient Education method: Consulting civil engineer, Demonstration, Verbal cues, and Handouts Education comprehension: verbalized understanding, returned demonstration, verbal cues required, and needs further education   HOME EXERCISE PROGRAM: Access Code: [email or web address] URL: [email or web address]       ASSESSMENT:  CLINICAL IMPRESSION: Edema is decreasing over lateral hip- discussed option of wearing compression shorts for a short period of time at home to compress swelling since tape was not great on skin. Will continue to use ice. Progressions of exercises are being adjusted with intolerance to seated or supine position and will continue to progress gait training and normalizing pattern.   Objective impairments include Abnormal gait, decreased activity tolerance, decreased endurance, decreased  mobility, difficulty walking, decreased ROM, decreased strength, hypomobility, impaired flexibility, and pain. These impairments are limiting patient from cleaning, community activity, driving, meal prep, shopping, and ambulation . Personal factors including 3+ comorbidities: Depression, anxiety, small fiber neuropathy and limited with positions due to pain  are also affecting patient's functional outcome.   REHAB POTENTIAL: Good  CLINICAL DECISION MAKING: Evolving/moderate complexity  EVALUATION COMPLEXITY: Moderate   GOALS:   SHORT TERM GOALS:  STG Name Target Date Goal status  1 Pt will be independent and compliant with HEP for improved pain, strength, and function.   Baseline:  07/23/2021 INITIAL  2 Pt will progress [place] per MD orders without adverse effects.  Baseline:  07/23/2021 INITIAL  3 Pt will progress with passive hip motion per protocol for improved stiffness and mobility.  Baseline: 07/12/2021 INITIAL  4 Pt will be able to perform car transfers without difficulty Baseline: 07/26/2021 INITIAL  5 Pt will ambulate with a normalized heel to toe gait pattern without an AD without limping. Baseline: 08/09/2021 INITIAL  6  Pt will progress with LE strengthening per protocol without adverse effects for improved tolerance with daily act's and functional mobility.  Baseline: 08/16/2021 INITIAL   LONG TERM GOALS:   LTG Name Target Date Goal status  1 Pt will be able to perform a 6 inch step up with good form and control without significant pain. Baseline: 08/23/2021 INITIAL  2 Pt will ambulate extended community distance without significant pain or limitation.  Baseline: 09/20/2021  INITIAL  3 Pt will be able to perform her ADLs and IADLs including cooking for her family without significant pain or difficulty.  Baseline: 09/06/2021 INITIAL  4 Pt will be able to perform stairs with a reciprocal gait with 1 rail. Baseline: 09/20/2021 INITIAL  5 Pt will demo at least 4+/5 MMT strength  in L hip flex, ER, knee ext, knee flex, and 4/5 hip abduction for improved tolerance with and performance of functional mobility skills Baseline: 09/20/2021 INITIAL             PLAN: PT FREQUENCY:  1x/wk initially and then progress to 2x/wk  PT DURATION: other: 14 weeks-16 weeks  PLANNED INTERVENTIONS: Therapeutic exercises, Therapeutic activity, Neuro Muscular re-education, Balance training, Gait training, Patient/Family education, Joint mobilization, Stair training, DME instructions, Aquatic Therapy, Electrical stimulation, Cryotherapy, Moist heat, Taping, Vasopneumatic device, Ultrasound, and Manual therapy  PLAN FOR NEXT SESSION: Cont per Dr. [name]  Gluteus Medius Repair and MD orders.  Unable to exercise in seated and supine.   Brelynn Wheller C. Japhet Morgenthaler PT, DPT 06/25/21 4:30 PM

## 2021-06-27 ENCOUNTER — Telehealth (HOSPITAL_BASED_OUTPATIENT_CLINIC_OR_DEPARTMENT_OTHER): Payer: PPO | Admitting: Psychiatry

## 2021-06-27 ENCOUNTER — Encounter (HOSPITAL_COMMUNITY): Payer: Self-pay | Admitting: Psychiatry

## 2021-06-27 ENCOUNTER — Other Ambulatory Visit: Payer: Self-pay

## 2021-06-27 VITALS — Wt 203.0 lb

## 2021-06-27 DIAGNOSIS — F331 Major depressive disorder, recurrent, moderate: Secondary | ICD-10-CM

## 2021-06-27 DIAGNOSIS — F509 Eating disorder, unspecified: Secondary | ICD-10-CM

## 2021-06-27 DIAGNOSIS — F5101 Primary insomnia: Secondary | ICD-10-CM | POA: Diagnosis not present

## 2021-06-27 DIAGNOSIS — F411 Generalized anxiety disorder: Secondary | ICD-10-CM | POA: Diagnosis not present

## 2021-06-27 MED ORDER — BUPROPION HCL 75 MG PO TABS: ORAL_TABLET | ORAL | 2 refills | Status: DC

## 2021-06-27 MED ORDER — ZIPRASIDONE HCL 80 MG PO CAPS: 80.0000 mg | ORAL_CAPSULE | Freq: Every day | ORAL | 2 refills | Status: DC

## 2021-06-27 MED ORDER — CLONAZEPAM 0.5 MG PO TABS: ORAL_TABLET | ORAL | 2 refills | Status: DC

## 2021-06-27 MED ORDER — TEMAZEPAM 30 MG PO CAPS: 30.0000 mg | ORAL_CAPSULE | Freq: Every day | ORAL | 2 refills | Status: DC
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
## 2021-06-28 ENCOUNTER — Encounter (HOSPITAL_BASED_OUTPATIENT_CLINIC_OR_DEPARTMENT_OTHER): Payer: Self-pay | Admitting: Physical Therapy

## 2021-07-01 DIAGNOSIS — F331 Major depressive disorder, recurrent, moderate: Secondary | ICD-10-CM | POA: Diagnosis not present

## 2021-07-04 ENCOUNTER — Ambulatory Visit (HOSPITAL_BASED_OUTPATIENT_CLINIC_OR_DEPARTMENT_OTHER): Payer: PPO | Attending: Orthopaedic Surgery | Admitting: Physical Therapy

## 2021-07-04 ENCOUNTER — Other Ambulatory Visit: Payer: Self-pay

## 2021-07-04 ENCOUNTER — Encounter (HOSPITAL_BASED_OUTPATIENT_CLINIC_OR_DEPARTMENT_OTHER): Payer: Self-pay | Admitting: Physical Therapy

## 2021-07-04 DIAGNOSIS — M25652 Stiffness of left hip, not elsewhere classified: Secondary | ICD-10-CM | POA: Diagnosis not present

## 2021-07-04 DIAGNOSIS — M6281 Muscle weakness (generalized): Secondary | ICD-10-CM

## 2021-07-04 DIAGNOSIS — R262 Difficulty in walking, not elsewhere classified: Secondary | ICD-10-CM

## 2021-07-04 DIAGNOSIS — M25552 Pain in left hip: Secondary | ICD-10-CM

## 2021-07-04 NOTE — Therapy (Signed)
OUTPATIENT PHYSICAL THERAPY LOWER EXTREMITYTREATMENT   Patient Name: Katelyn [name] MRN: [id number] DOB:[birth year], 46 y.o., female Today's Date: 07/04/2021   PT End of Session - 07/04/21 0818     Visit Number 7    Number of Visits 25    Date for PT Re-Evaluation 08/23/21    PT Start Time 0803    PT Stop Time 0841    PT Time Calculation (min) 38 min    Activity Tolerance Patient tolerated treatment well    Behavior During Therapy [facility] for tasks assessed/performed                 Past Medical History:  Diagnosis Date   Anemia    Anorexia    Anxiety    Asthma    controlled with meds   Benign juvenile melanoma    Chronic headaches    Colon polyps    found on colonoscopy 32/44/0102   Complication of anesthesia    itching after epidural for c section   Constipation    Depression    Depression    several suicide attempts, hospitaluzed in 2012 for this; hx pf ECT treatments ; pt sees Dr. Adele Schilder pyschiatrist  and doing well on medication   Dysrhythmia    hx of PVC's (pt hasn't seen cardiologist since 2021- no follow up needed)   GERD (gastroesophageal reflux disease)    Headache    Heart murmur    as a child - no one mentions hearing murmur anymore, sometimes it is heard. Echo has been performed 02/03/18   Heartburn    no meds   History of pneumonia    x 3  years ago - no recent problems   Hx of blood clots    Neuromuscular disorder (HCC)    small fiber neuropathy   Obesity    Pneumonia    Polycystic ovary    takes metformin to treat   Renal atrophy, left 2023   Skin lesion    Excisional biopsy of moles - none cancerous   Sleep apnea    yrs ago - diagnosed mild sleep apnea - did not have to use cpap    Past Surgical History:  Procedure Laterality Date   BREAST CAPSULECTOMY Bilateral 12/13/2020   1 week later pt had hematoma surgery on left breast   BREAST ENHANCEMENT SURGERY Bilateral 02/11/2017   BREATH TEK H PYLORI N/A 07/22/2013   Procedure:  BREATH TEK H PYLORI;  Surgeon: Edward Jolly, MD;  Location: [facility];  Service: General;  Laterality: N/A;   CESAREAN SECTION  2004, 2007   x 2   COLONOSCOPY  2013   CYSTOSCOPY WITH URETHRAL DILATATION  age 29    GASTRIC ROUX-EN-Y N/A 10/31/2013   Procedure: LAPAROSCOPIC ROUX-EN-Y GASTRIC BYPASS WITH UPPER ENDOSCOPY;  Surgeon: Edward Jolly, MD;  Location: [facility];  Service: General;  Laterality: N/A;   GLUTEUS MINIMUS REPAIR Left 05/28/2021   Procedure: Left [place];  Surgeon: Vanetta Mulders, MD;  Location: [facility];  Service: Orthopedics;  Laterality: Left;   ingrown toenail Right    Ingrown nail on great right toe   kidney stent  08/28/2014   mole excision     "benign juvenile melanoma" removed from left leg - inner thigh   POLYPECTOMY     2013   SKIN LESION EXCISION     back   TONSILLECTOMY  age 71   TUBAL LIGATION  2018   Patient Active Problem List  Diagnosis Date Noted   Tear of left gluteus medius tendon    Vitamin B 12 deficiency 10/16/2019   Elevated LFTs 10/16/2019   Asthma, exogenous, unspecified asthma severity, uncomplicated 85/05/7739   Multiple environmental allergies 08/29/2018   B12 deficiency anemia 06/21/2018   Venous insufficiency 04/23/2018   Onychomycosis of toenail 04/17/2018   Chronic myofascial pain 03/18/2018   Small fiber neuropathy 03/18/2018   Idiopathic small fiber sensory neuropathy 02/23/2018   Varicose veins of both lower extremities 01/13/2018   Generalized pain 10/16/2017   Irregular menses 10/16/2017   Cervical spondylosis without myelopathy 08/28/2017   Migraine with aura and without status migrainosus, not intractable 07/22/2017   Lumbar degenerative disc disease 07/20/2017   Spondylosis of lumbar spine 07/20/2017   Coccydynia 03/12/2017   Bariatric surgery status 05/02/2016   Major depressive disorder, recurrent episode, moderate ([place]) 02/18/2016   Tachy-brady syndrome ([place]) 12/19/2015   Candidal  vulvovaginitis 10/15/2015   Iron deficiency anemia 06/10/2015   Sleep disorder 06/01/2013   Generalized anxiety disorder 05/30/2013   Chronic headaches 07/01/2012   Depression, major, recurrent, severe with psychosis ([place]) 06/17/2012   Fatigue 03/05/2012   Family hx colonic polyps 03/03/2012   Eating disorder 12/18/2011   POLYCYSTIC OVARIAN DISEASE 09/01/2008   Intermittent left-sided chest pain 08/02/2008    PCP: Kathyrn Drown, MD  REFERRING PROVIDER: Vanetta Mulders, Md  REFERRING DIAG: [id number] (ICD-10-CM) - Tear of left gluteus medius tendon, initial encounter  THERAPY DIAG:  Pain in left hip  Stiffness of left hip, not elsewhere classified  Muscle weakness (generalized)  Difficulty in walking, not elsewhere classified  ONSET DATE: 05/28/2021   SUBJECTIVE:   SUBJECTIVE STATEMENT: Pt states she had too many repetitions last time and was in bed for two days.    PERTINENT HISTORY: -Left hip gluteus medius repair open and open trochanteric bursectomy 05/28/2021. -Anemia, Depression, anxiety, Hx of blood clots in 2018 , small fiber neuropathy, anorexia, Breast reconstruction surgery in August 2022, gastric bypass surgery in 2015   PAIN:  Are you having pain? Yes NPRS scale: 6/10 Sharp and stabbing with certain motions, aching at rest Pain location: anterior area of hip is a new pain today   PRECAUTIONS: Other: surgical protocol; small fiber neuropathy   WEIGHT BEARING RESTRICTIONS Yes PWB'ing   LIVING ENVIRONMENT: Lives with: lives with their family; husband and son Lives in: House/apartment; 2 story home, but stays on 1st floor Stairs: Yes; 2 steps to enter home without rail Has following equipment at home: Single point cane, Walker - 2 wheeled, and Crutches  OCCUPATION: Pt is on disability  PLOF: Independent; Pt ambulated without an AD.  Husband and son did most of the chores but she performed light chores.  Pt hasn't been able to perform stairs for  awhile.  PATIENT GOALS pt wants to be able to lie on her side, dress independently, reduce pain, return to normal walking.  Wants to be able to perform stairs.    OBJECTIVE:   DIAGNOSTIC FINDINGS: MRI prior to surgery   TODAY'S TREATMENT:  3/9 PROM hip into 100 deg of flexion Supine SKTC 5s 10x Bridge 3x5 Standing hip ABD 2x10 Standing HS curl 2x10 Sidestepping along table 4x  Standing hip hinge with UE support 2x10 Standing cycle L 2x10 HR 2x10  Gait: SPC training/walking; sequencing, safety, position/ergonomics  2/28: MANUAL: edema mobilization around incision site- stitch protruding at most distal end without s/s of infection & covered with bandaid Qped on shuttle single leg press #6  Standing hip flexion taps on 8' step- focus on weight shift Heel raises  2/23:   Stand weight shift 5s 2x10 Standing glut sets 2s 2x10 Quadruped hip rocking 2s 2x10 Standing HR 2x10 Standing mini squats 3x5 Heel toe rocking with bilat ax crutch 10x Standing on foam 20s 3x LAQ 3lbs 3x10  2/21: MANUAL: edema mob in sidelying to Lt hip, ktape- edema to Rt hip around incision site Standing glut sets Standing mini squats Isometric hip extension PT holding bolster Posterior weight shift- reaching back with heel & with heel flat   PATIENT EDUCATION:  Education details: exercise progression, DOMS expectations, muscle firing,  envelope of function, HEP, POC   Person educated: Patient Education method: Explanation, Demonstration, Verbal cues, and Handouts Education comprehension: verbalized understanding, returned demonstration, verbal cues required, and needs further education   HOME EXERCISE PROGRAM: Access Code: [email or web address] URL: [email or web address] Date: 07/04/2021 Prepared by: Daleen Bo  Exercises Quadruped Transversus Abdominis Bracing - 1 x daily - 7 x weekly - 30s hold Quadruped Hip Extension Kicks - 1 x daily - 7 x weekly - 3 sets - 5 reps Standing Hip  Hinge - 1 x daily - 7 x weekly - 3 sets - 10 reps Hip Hinge Rock Back - 1 x daily - 7 x weekly - 2 sets - 10 reps Supine Bridge - 1 x daily - 7 x weekly - 3 sets - 5 reps        ASSESSMENT:  CLINICAL IMPRESSION: Pt with improved tolerance to standing and flexion based activity. Hip flexion and ABD were taken to patient tolerance today given report of pain last session. Pt able to progess CKC exercise today and HEP updated accordingly. Plan to assess response to today's session and progress ROM as tolerated as pt is very limited with LE self care type ADL.   Objective impairments include Abnormal gait, decreased activity tolerance, decreased endurance, decreased mobility, difficulty walking, decreased ROM, decreased strength, hypomobility, impaired flexibility, and pain. These impairments are limiting patient from cleaning, community activity, driving, meal prep, shopping, and ambulation . Personal factors including 3+ comorbidities: Depression, anxiety, small fiber neuropathy and limited with positions due to pain  are also affecting patient's functional outcome.   REHAB POTENTIAL: Good  CLINICAL DECISION MAKING: Evolving/moderate complexity  EVALUATION COMPLEXITY: Moderate   GOALS:   SHORT TERM GOALS:  STG Name Target Date Goal status  1 Pt will be independent and compliant with HEP for improved pain, strength, and function.   Baseline:  08/01/2021 INITIAL  2 Pt will progress [place] per MD orders without adverse effects.  Baseline:  08/01/2021 INITIAL  3 Pt will progress with passive hip motion per protocol for improved stiffness and mobility.  Baseline: 07/12/2021 INITIAL  4 Pt will be able to perform car transfers without difficulty Baseline: 07/26/2021 INITIAL  5 Pt will ambulate with a normalized heel to toe gait pattern without an AD without limping. Baseline: 08/09/2021 INITIAL  6  Pt will progress with LE strengthening per protocol without adverse effects for improved  tolerance with daily act's and functional mobility.  Baseline: 08/16/2021 INITIAL   LONG TERM GOALS:   LTG Name Target Date Goal status  1 Pt will be able to perform a 6 inch step up with good form and control without significant pain. Baseline: 08/23/2021 INITIAL  2 Pt will ambulate extended community distance without significant pain or limitation.  Baseline: 09/20/2021  INITIAL  3 Pt will be able to perform her ADLs and IADLs  including cooking for her family without significant pain or difficulty.  Baseline: 09/06/2021 INITIAL  4 Pt will be able to perform stairs with a reciprocal gait with 1 rail. Baseline: 09/20/2021 INITIAL  5 Pt will demo at least 4+/5 MMT strength in L hip flex, ER, knee ext, knee flex, and 4/5 hip abduction for improved tolerance with and performance of functional mobility skills Baseline: 09/20/2021 INITIAL             PLAN: PT FREQUENCY:  1x/wk initially and then progress to 2x/wk  PT DURATION: other: 14 weeks-16 weeks  PLANNED INTERVENTIONS: Therapeutic exercises, Therapeutic activity, Neuro Muscular re-education, Balance training, Gait training, Patient/Family education, Joint mobilization, Stair training, DME instructions, Aquatic Therapy, Electrical stimulation, Cryotherapy, Moist heat, Taping, Vasopneumatic device, Ultrasound, and Manual therapy  PLAN FOR NEXT SESSION: Cont per Dr. Rosebud Poles Medius Repair and MD orders.  Unable to exercise in seated and supine.   Daleen Bo PT, DPT 07/04/21 9:05 AM

## 2021-07-10 ENCOUNTER — Encounter (HOSPITAL_BASED_OUTPATIENT_CLINIC_OR_DEPARTMENT_OTHER): Payer: Self-pay | Admitting: Physical Therapy

## 2021-07-10 ENCOUNTER — Ambulatory Visit (INDEPENDENT_AMBULATORY_CARE_PROVIDER_SITE_OTHER): Payer: PPO | Admitting: Orthopaedic Surgery

## 2021-07-10 ENCOUNTER — Other Ambulatory Visit: Payer: Self-pay

## 2021-07-10 DIAGNOSIS — S76012A Strain of muscle, fascia and tendon of left hip, initial encounter: Secondary | ICD-10-CM
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
## 2021-07-12 ENCOUNTER — Other Ambulatory Visit: Payer: Self-pay

## 2021-07-12 ENCOUNTER — Ambulatory Visit (HOSPITAL_BASED_OUTPATIENT_CLINIC_OR_DEPARTMENT_OTHER): Payer: PPO | Admitting: Physical Therapy

## 2021-07-12 ENCOUNTER — Encounter (HOSPITAL_BASED_OUTPATIENT_CLINIC_OR_DEPARTMENT_OTHER): Payer: Self-pay | Admitting: Physical Therapy

## 2021-07-12 DIAGNOSIS — R262 Difficulty in walking, not elsewhere classified: Secondary | ICD-10-CM

## 2021-07-12 DIAGNOSIS — M25552 Pain in left hip: Secondary | ICD-10-CM | POA: Diagnosis not present

## 2021-07-12 DIAGNOSIS — M6281 Muscle weakness (generalized): Secondary | ICD-10-CM

## 2021-07-12 DIAGNOSIS — M25652 Stiffness of left hip, not elsewhere classified: Secondary | ICD-10-CM

## 2021-07-12 NOTE — Therapy (Signed)
?OUTPATIENT PHYSICAL THERAPY LOWER EXTREMITYTREATMENT ? ? ?Patient Name: Katelyn [name] ?MRN: [id number] ?DOB:[birth year], 46 y.o., female ?Today's Date: 07/12/2021 ? ? PT End of Session - 07/12/21 0816   ? ? Visit Number 8   ? Number of Visits 25   ? Date for PT Re-Evaluation 08/23/21   ? PT Start Time [phone]   ? PT Stop Time 0831   ? PT Time Calculation (min) 25 min   ? Activity Tolerance Patient tolerated treatment well   ? Behavior During Therapy [facility] for tasks assessed/performed   ? ?  ?  ? ?  ? ? ? ? ? ? ? ?Past Medical History:  ?Diagnosis Date  ? Anemia   ? Anorexia   ? Anxiety   ? Asthma   ? controlled with meds  ? Benign juvenile melanoma   ? Chronic headaches   ? Colon polyps   ? found on colonoscopy 04/26/2012  ? Complication of anesthesia   ? itching after epidural for c section  ? Constipation   ? Depression   ? Depression   ? several suicide attempts, hospitaluzed in 2012 for this; hx pf ECT treatments ; pt sees Dr. Adele Schilder pyschiatrist  and doing well on medication  ? Dysrhythmia   ? hx of PVC's (pt hasn't seen cardiologist since 2021- no follow up needed)  ? GERD (gastroesophageal reflux disease)   ? Headache   ? Heart murmur   ? as a child - no one mentions hearing murmur anymore, sometimes it is heard. Echo has been performed 02/03/18  ? Heartburn   ? no meds  ? History of pneumonia   ? x 3  years ago - no recent problems  ? Hx of blood clots   ? Neuromuscular disorder ([place])   ? small fiber neuropathy  ? Obesity   ? Pneumonia   ? Polycystic ovary   ? takes metformin to treat  ? Renal atrophy, left 2023  ? Skin lesion   ? Excisional biopsy of moles - none cancerous  ? Sleep apnea   ? yrs ago - diagnosed mild sleep apnea - did not have to use cpap   ? ?Past Surgical History:  ?Procedure Laterality Date  ? BREAST CAPSULECTOMY Bilateral 12/13/2020  ? 1 week later pt had hematoma surgery on left breast  ? BREAST ENHANCEMENT SURGERY Bilateral 02/11/2017  ? BREATH TEK H PYLORI N/A 07/22/2013  ?  Procedure: BREATH TEK H PYLORI;  Surgeon: Edward Jolly, MD;  Location: [facility];  Service: General;  Laterality: N/A;  ? CESAREAN SECTION  2004, 2007  ? x 2  ? COLONOSCOPY  2013  ? CYSTOSCOPY WITH URETHRAL DILATATION  age 12   ? GASTRIC ROUX-EN-Y N/A 10/31/2013  ? Procedure: LAPAROSCOPIC ROUX-EN-Y GASTRIC BYPASS WITH UPPER ENDOSCOPY;  Surgeon: Edward Jolly, MD;  Location: [facility];  Service: General;  Laterality: N/A;  ? GLUTEUS MINIMUS REPAIR Left 05/28/2021  ? Procedure: Left GLUTEUS Medius REPAIR;  Surgeon: Vanetta Mulders, MD;  Location: [facility];  Service: Orthopedics;  Laterality: Left;  ? ingrown toenail Right   ? Ingrown nail on great right toe  ? kidney stent  08/28/2014  ? mole excision    ? "benign juvenile melanoma" removed from left leg - inner thigh  ? POLYPECTOMY    ? 2013  ? SKIN LESION EXCISION    ? back  ? TONSILLECTOMY  age 25  ? TUBAL LIGATION  2018  ? ?Patient Active Problem List  ?  Diagnosis Date Noted  ? Tear of left gluteus medius tendon   ? Vitamin B 12 deficiency 10/16/2019  ? Elevated LFTs 10/16/2019  ? Asthma, exogenous, unspecified asthma severity, uncomplicated 52/77/8242  ? Multiple environmental allergies 08/29/2018  ? B12 deficiency anemia 06/21/2018  ? Venous insufficiency 04/23/2018  ? Onychomycosis of toenail 04/17/2018  ? Chronic myofascial pain 03/18/2018  ? Small fiber neuropathy 03/18/2018  ? Idiopathic small fiber sensory neuropathy 02/23/2018  ? Varicose veins of both lower extremities 01/13/2018  ? Generalized pain 10/16/2017  ? Irregular menses 10/16/2017  ? Cervical spondylosis without myelopathy 08/28/2017  ? Migraine with aura and without status migrainosus, not intractable 07/22/2017  ? Lumbar degenerative disc disease 07/20/2017  ? Spondylosis of lumbar spine 07/20/2017  ? Coccydynia 03/12/2017  ? Bariatric surgery status 05/02/2016  ? Major depressive disorder, recurrent episode, moderate ([place]) 02/18/2016  ? Tachy-brady syndrome ([place]) 12/19/2015  ?  Candidal vulvovaginitis 10/15/2015  ? Iron deficiency anemia 06/10/2015  ? Sleep disorder 06/01/2013  ? Generalized anxiety disorder 05/30/2013  ? Chronic headaches 07/01/2012  ? Depression, major, recurrent, severe with psychosis ([place]) 06/17/2012  ? Fatigue 03/05/2012  ? Family hx colonic polyps 03/03/2012  ? Eating disorder 12/18/2011  ? POLYCYSTIC OVARIAN DISEASE 09/01/2008  ? Intermittent left-sided chest pain 08/02/2008  ? ? ?PCP: Kathyrn Drown, MD ? ?REFERRING PROVIDER: Vanetta Mulders, Md ? ?REFERRING DIAG: S76.012A (ICD-10-CM) - Tear of left gluteus medius tendon, initial encounter ? ?THERAPY DIAG:  ?Pain in left hip ? ?Muscle weakness (generalized) ? ?Difficulty in walking, not elsewhere classified ? ?Stiffness of left hip, not elsewhere classified ? ?ONSET DATE: 05/28/2021  ? ?SUBJECTIVE:  ? ?SUBJECTIVE STATEMENT: ?Pt states her hip is both sore and painful today. She has been trying to do more in terms of walking and steps. She recently had to stand for 2 hours for her daughter at the ER and was very painful after. She states the swelling is much better.  ? ? ?PERTINENT HISTORY: ?-Left hip gluteus medius repair open and open trochanteric bursectomy 05/28/2021. ?-Anemia, Depression, anxiety, Hx of blood clots in 2018 , small fiber neuropathy, anorexia, Breast reconstruction surgery in August 2022, gastric bypass surgery in 2015 ? ? ?PAIN:  ?Are you having pain? Yes ?NPRS scale: 7/10 ?Sharp and stabbing with certain motions, aching at rest ?Pain location: anterior area of hip is a new pain today ? ? ?PRECAUTIONS: Other: surgical protocol; small fiber neuropathy  ? ?WEIGHT BEARING RESTRICTIONS Yes [place] ? ? ?LIVING ENVIRONMENT: ?Lives with: lives with their family; husband and son ?Lives in: House/apartment; 2 story home, but stays on 1st floor ?Stairs: Yes; 2 steps to enter home without rail ?Has following equipment at home: Single point cane, Walker - 2 wheeled, and Crutches ? ?OCCUPATION: Pt is on  disability ? ?PLOF: Independent; Pt ambulated without an AD.  Husband and son did most of the chores but she performed light chores.  Pt hasn't been able to perform stairs for awhile. ? ?PATIENT GOALS pt wants to be able to lie on her side, dress independently, reduce pain, return to normal walking.  Wants to be able to perform stairs.  ? ? ?OBJECTIVE:  ? ?DIAGNOSTIC FINDINGS: MRI prior to surgery ? ? ?TODAY'S TREATMENT: ?3/17 ? ?Seated lumbar flexion stretch 5s 10x ?Quadruped LE extension 10x each ?Bridge 3x5 ?Standing hip ABD 2x10 ?Standing HS curl 2x10 ?Sidestepping along table 4x  ?Standing hip hinge with UE support 2x10 ?Standing ITB stretch 3s 10x ? ? ?3/9 ?PROM  hip into 100 deg of flexion ?Supine SKTC 5s 10x ?Bridge 3x5 ?Standing hip ABD 2x10 ?Standing HS curl 2x10 ?Sidestepping along table 4x  ?Standing hip hinge with UE support 2x10 ?Standing cycle L 2x10 ?HR 2x10 ? ?Gait: SPC training/walking; sequencing, safety, position/ergonomics ? ?2/28: ?MANUAL: edema mobilization around incision site- stitch protruding at most distal end without s/s of infection & covered with bandaid ?Qped on shuttle single leg press #6  ?Standing hip flexion taps on 8' step- focus on weight shift ?Heel raises ? ?2/23:  ? ?Stand weight shift 5s 2x10 ?Standing glut sets 2s 2x10 ?Quadruped hip rocking 2s 2x10 ?Standing HR 2x10 ?Standing mini squats 3x5 ?Heel toe rocking with bilat ax crutch 10x ?Standing on foam 20s 3x ?LAQ 3lbs 3x10 ? ?2/21: ?MANUAL: edema mob in sidelying to Lt hip, ktape- edema to Rt hip around incision site ?Standing glut sets ?Standing mini squats ?Isometric hip extension PT holding bolster ?Posterior weight shift- reaching back with heel & with heel flat ? ? ?PATIENT EDUCATION:  ?Education details: exercise progression, DOMS expectations, muscle firing,  envelope of function, HEP, POC ? ? ?Person educated: Patient ?Education method: Explanation, Demonstration, Verbal cues, and Handouts ?Education comprehension:  verbalized understanding, returned demonstration, verbal cues required, and needs further education ? ? ?HOME EXERCISE PROGRAM: ?Access Code: [email or web address] ?URL: [email or web address] ?Date: 07/12/2021 ?Prepared

## 2021-07-16 ENCOUNTER — Encounter (HOSPITAL_BASED_OUTPATIENT_CLINIC_OR_DEPARTMENT_OTHER): Payer: Self-pay | Admitting: Physical Therapy

## 2021-07-18 ENCOUNTER — Ambulatory Visit (HOSPITAL_BASED_OUTPATIENT_CLINIC_OR_DEPARTMENT_OTHER): Payer: PPO | Admitting: Physical Therapy

## 2021-07-18 ENCOUNTER — Other Ambulatory Visit: Payer: Self-pay

## 2021-07-18 ENCOUNTER — Encounter (HOSPITAL_BASED_OUTPATIENT_CLINIC_OR_DEPARTMENT_OTHER): Payer: Self-pay | Admitting: Physical Therapy

## 2021-07-18 DIAGNOSIS — M6281 Muscle weakness (generalized): Secondary | ICD-10-CM

## 2021-07-18 DIAGNOSIS — R262 Difficulty in walking, not elsewhere classified: Secondary | ICD-10-CM

## 2021-07-18 DIAGNOSIS — M25652 Stiffness of left hip, not elsewhere classified: Secondary | ICD-10-CM

## 2021-07-18 DIAGNOSIS — M25552 Pain in left hip: Secondary | ICD-10-CM

## 2021-07-18 NOTE — Therapy (Signed)
?OUTPATIENT PHYSICAL THERAPY LOWER EXTREMITYTREATMENT ? ? ?Patient Name: Katelyn [name] ?MRN: [id number] ?DOB:[birth year], 46 y.o., female ?Today's Date: 07/18/2021 ? ? PT End of Session - 07/18/21 0109   ? ? Visit Number 9   ? Number of Visits 25   ? Date for PT Re-Evaluation 08/23/21   ? Authorization Type HTA   ? PT Start Time [phone]   ? PT Stop Time 5732   ? PT Time Calculation (min) 29 min   ? Activity Tolerance Patient tolerated treatment well   ? Behavior During Therapy [facility] for tasks assessed/performed   ? ?  ?  ? ?  ? ? ? ? ? ? ? ? ?Past Medical History:  ?Diagnosis Date  ? Anemia   ? Anorexia   ? Anxiety   ? Asthma   ? controlled with meds  ? Benign juvenile melanoma   ? Chronic headaches   ? Colon polyps   ? found on colonoscopy 04/26/2012  ? Complication of anesthesia   ? itching after epidural for c section  ? Constipation   ? Depression   ? Depression   ? several suicide attempts, hospitaluzed in 2012 for this; hx pf ECT treatments ; pt sees Dr. Adele Schilder pyschiatrist  and doing well on medication  ? Dysrhythmia   ? hx of PVC's (pt hasn't seen cardiologist since 2021- no follow up needed)  ? GERD (gastroesophageal reflux disease)   ? Headache   ? Heart murmur   ? as a child - no one mentions hearing murmur anymore, sometimes it is heard. Echo has been performed 02/03/18  ? Heartburn   ? no meds  ? History of pneumonia   ? x 3  years ago - no recent problems  ? Hx of blood clots   ? Neuromuscular disorder ([place])   ? small fiber neuropathy  ? Obesity   ? Pneumonia   ? Polycystic ovary   ? takes metformin to treat  ? Renal atrophy, left 2023  ? Skin lesion   ? Excisional biopsy of moles - none cancerous  ? Sleep apnea   ? yrs ago - diagnosed mild sleep apnea - did not have to use cpap   ? ?Past Surgical History:  ?Procedure Laterality Date  ? BREAST CAPSULECTOMY Bilateral 12/13/2020  ? 1 week later pt had hematoma surgery on left breast  ? BREAST ENHANCEMENT SURGERY Bilateral 02/11/2017  ? BREATH TEK H  PYLORI N/A 07/22/2013  ? Procedure: BREATH TEK H PYLORI;  Surgeon: Edward Jolly, MD;  Location: [facility];  Service: General;  Laterality: N/A;  ? CESAREAN SECTION  2004, 2007  ? x 2  ? COLONOSCOPY  2013  ? CYSTOSCOPY WITH URETHRAL DILATATION  age 89   ? GASTRIC ROUX-EN-Y N/A 10/31/2013  ? Procedure: LAPAROSCOPIC ROUX-EN-Y GASTRIC BYPASS WITH UPPER ENDOSCOPY;  Surgeon: Edward Jolly, MD;  Location: [facility];  Service: General;  Laterality: N/A;  ? GLUTEUS MINIMUS REPAIR Left 05/28/2021  ? Procedure: Left GLUTEUS Medius REPAIR;  Surgeon: Vanetta Mulders, MD;  Location: [place];  Service: Orthopedics;  Laterality: Left;  ? ingrown toenail Right   ? Ingrown nail on great right toe  ? kidney stent  08/28/2014  ? mole excision    ? "benign juvenile melanoma" removed from left leg - inner thigh  ? POLYPECTOMY    ? 2013  ? SKIN LESION EXCISION    ? back  ? TONSILLECTOMY  age 63  ? TUBAL LIGATION  2018  ? ?  Patient Active Problem List  ? Diagnosis Date Noted  ? Tear of left gluteus medius tendon   ? Vitamin B 12 deficiency 10/16/2019  ? Elevated LFTs 10/16/2019  ? Asthma, exogenous, unspecified asthma severity, uncomplicated 62/70/3500  ? Multiple environmental allergies 08/29/2018  ? B12 deficiency anemia 06/21/2018  ? Venous insufficiency 04/23/2018  ? Onychomycosis of toenail 04/17/2018  ? Chronic myofascial pain 03/18/2018  ? Small fiber neuropathy 03/18/2018  ? Idiopathic small fiber sensory neuropathy 02/23/2018  ? Varicose veins of both lower extremities 01/13/2018  ? Generalized pain 10/16/2017  ? Irregular menses 10/16/2017  ? Cervical spondylosis without myelopathy 08/28/2017  ? Migraine with aura and without status migrainosus, not intractable 07/22/2017  ? Lumbar degenerative disc disease 07/20/2017  ? Spondylosis of lumbar spine 07/20/2017  ? Coccydynia 03/12/2017  ? Bariatric surgery status 05/02/2016  ? Major depressive disorder, recurrent episode, moderate ([place]) 02/18/2016  ? Tachy-brady syndrome  ([place]) 12/19/2015  ? Candidal vulvovaginitis 10/15/2015  ? Iron deficiency anemia 06/10/2015  ? Sleep disorder 06/01/2013  ? Generalized anxiety disorder 05/30/2013  ? Chronic headaches 07/01/2012  ? Depression, major, recurrent, severe with psychosis ([place]) 06/17/2012  ? Fatigue 03/05/2012  ? Family hx colonic polyps 03/03/2012  ? Eating disorder 12/18/2011  ? POLYCYSTIC OVARIAN DISEASE 09/01/2008  ? Intermittent left-sided chest pain 08/02/2008  ? ? ?PCP: Kathyrn Drown, MD ? ?REFERRING PROVIDER: Vanetta Mulders, Md ? ?REFERRING DIAG: S76.012A (ICD-10-CM) - Tear of left gluteus medius tendon, initial encounter ? ?THERAPY DIAG:  ?Pain in left hip ? ?Muscle weakness (generalized) ? ?Difficulty in walking, not elsewhere classified ? ?Stiffness of left hip, not elsewhere classified ? ?ONSET DATE: 05/28/2021  ? ?SUBJECTIVE:  ? ?SUBJECTIVE STATEMENT: ?Pt states that her hip pain has been worse due to being on it more and walking on it more. She states she feels her limit for standing still for standing is about 30 mins. Had to standing for about 2 hours for her daughter's MD appts. Pt would like to work on stairs and be able to get up a flight of stairs at home.  ? ? ?PERTINENT HISTORY: ?-Left hip gluteus medius repair open and open trochanteric bursectomy 05/28/2021. ?-Anemia, Depression, anxiety, Hx of blood clots in 2018 , small fiber neuropathy, anorexia, Breast reconstruction surgery in August 2022, gastric bypass surgery in 2015 ? ? ?PAIN:  ?Are you having pain? Yes ?NPRS scale: 6/10 ?Sharp and stabbing with certain motions, aching at rest ?Pain location: anterior area of hip is a new pain today ? ? ?PRECAUTIONS: Other: surgical protocol; small fiber neuropathy  ? ?WEIGHT BEARING RESTRICTIONS Yes [place] ? ? ?LIVING ENVIRONMENT: ?Lives with: lives with their family; husband and son ?Lives in: House/apartment; 2 story home, but stays on 1st floor ?Stairs: Yes; 2 steps to enter home without rail ?Has following equipment  at home: Single point cane, Walker - 2 wheeled, and Crutches ? ?OCCUPATION: Pt is on disability ? ?PLOF: Independent; Pt ambulated without an AD.  Husband and son did most of the chores but she performed light chores.  Pt hasn't been able to perform stairs for awhile. ? ?PATIENT GOALS pt wants to be able to lie on her side, dress independently, reduce pain, return to normal walking.  Wants to be able to perform stairs.  ? ? ?OBJECTIVE:  ? ?DIAGNOSTIC FINDINGS: MRI prior to surgery ? ? ?TODAY'S TREATMENT: ?3/23 ? ?Seated lumbar flexion stretch 5s 15x ?Supine SKTC 10s 10x ?Supine piriformis stretch 30s 3x ?Standing ITB stretch 30s  3x ?Sidestepping along table 4x  ?Standing hip hinge with UE support 2x10 ?Standing ITB stretch 3s 10x ?4" step up 4x5 ? ?3/17 ? ?Seated lumbar flexion stretch 5s 10x ?Quadruped LE extension 10x each ?Bridge 3x5 ?Standing hip ABD 2x10 ?Standing HS curl 2x10 ?Sidestepping along table 4x  ?Standing hip hinge with UE support 2x10 ?Standing ITB stretch 3s 10x ? ? ?3/9 ?PROM hip into 100 deg of flexion ?Supine SKTC 5s 10x ?Bridge 3x5 ?Standing hip ABD 2x10 ?Standing HS curl 2x10 ?Sidestepping along table 4x  ?Standing hip hinge with UE support 2x10 ?Standing cycle L 2x10 ?HR 2x10 ? ?Gait: SPC training/walking; sequencing, safety, position/ergonomics ? ?2/28: ?MANUAL: edema mobilization around incision site- stitch protruding at most distal end without s/s of infection & covered with bandaid ?Qped on shuttle single leg press #6  ?Standing hip flexion taps on 8' step- focus on weight shift ?Heel raises ? ?2/23:  ? ?Stand weight shift 5s 2x10 ?Standing glut sets 2s 2x10 ?Quadruped hip rocking 2s 2x10 ?Standing HR 2x10 ?Standing mini squats 3x5 ?Heel toe rocking with bilat ax crutch 10x ?Standing on foam 20s 3x ?LAQ 3lbs 3x10 ? ?2/21: ?MANUAL: edema mob in sidelying to Lt hip, ktape- edema to Rt hip around incision site ?Standing glut sets ?Standing mini squats ?Isometric hip extension PT holding  bolster ?Posterior weight shift- reaching back with heel & with heel flat ? ? ?PATIENT EDUCATION:  ?Education details: exercise progression, DOMS expectations, muscle firing,  envelope of function, HEP, POC

## 2021-07-19 DIAGNOSIS — F331 Major depressive disorder, recurrent, moderate: Secondary | ICD-10-CM | POA: Diagnosis not present

## 2021-07-22 ENCOUNTER — Encounter (HOSPITAL_BASED_OUTPATIENT_CLINIC_OR_DEPARTMENT_OTHER): Payer: Self-pay | Admitting: Physical Therapy

## 2021-07-25 ENCOUNTER — Ambulatory Visit (HOSPITAL_BASED_OUTPATIENT_CLINIC_OR_DEPARTMENT_OTHER): Payer: PPO | Admitting: Physical Therapy

## 2021-07-25 DIAGNOSIS — M25552 Pain in left hip: Secondary | ICD-10-CM

## 2021-07-25 DIAGNOSIS — R262 Difficulty in walking, not elsewhere classified: Secondary | ICD-10-CM

## 2021-07-25 DIAGNOSIS — M6281 Muscle weakness (generalized): Secondary | ICD-10-CM

## 2021-07-25 DIAGNOSIS — M25652 Stiffness of left hip, not elsewhere classified: Secondary | ICD-10-CM

## 2021-07-25 NOTE — Therapy (Addendum)
?OUTPATIENT PHYSICAL THERAPY PROGRESS NOTE ? ? ? ? ?Patient Name: Katelyn [name] ?MRN: [id number] ?DOB:1976/01/23, 46 y.o., female ?Today's Date: 07/25/2021 ? ? PT End of Session - 07/25/21 0847   ? ? Visit Number 10   ? Number of Visits 25   ? Date for PT Re-Evaluation 08/23/21   ? Authorization Type HTA   ? PT Start Time 0845   ? PT Stop Time 0086   ? PT Time Calculation (min) 30 min   ? Activity Tolerance Patient tolerated treatment well   ? Behavior During Therapy [facility] for tasks assessed/performed   ? ?  ?  ? ?  ? ? ? ? ? ? ? ? ? ?Past Medical History:  ?Diagnosis Date  ? Anemia   ? Anorexia   ? Anxiety   ? Asthma   ? controlled with meds  ? Benign juvenile melanoma   ? Chronic headaches   ? Colon polyps   ? found on colonoscopy 04/26/2012  ? Complication of anesthesia   ? itching after epidural for c section  ? Constipation   ? Depression   ? Depression   ? several suicide attempts, hospitaluzed in 2012 for this; hx pf ECT treatments ; pt sees Dr. Adele Schilder pyschiatrist  and doing well on medication  ? Dysrhythmia   ? hx of PVC's (pt hasn't seen cardiologist since 2021- no follow up needed)  ? GERD (gastroesophageal reflux disease)   ? Headache   ? Heart murmur   ? as a child - no one mentions hearing murmur anymore, sometimes it is heard. Echo has been performed 02/03/18  ? Heartburn   ? no meds  ? History of pneumonia   ? x 3  years ago - no recent problems  ? Hx of blood clots   ? Neuromuscular disorder ([place])   ? small fiber neuropathy  ? Obesity   ? Pneumonia   ? Polycystic ovary   ? takes metformin to treat  ? Renal atrophy, left 2023  ? Skin lesion   ? Excisional biopsy of moles - none cancerous  ? Sleep apnea   ? yrs ago - diagnosed mild sleep apnea - did not have to use cpap   ? ?Past Surgical History:  ?Procedure Laterality Date  ? BREAST CAPSULECTOMY Bilateral 12/13/2020  ? 1 week later pt had hematoma surgery on left breast  ? BREAST ENHANCEMENT SURGERY Bilateral 02/11/2017  ? BREATH TEK H PYLORI  N/A 07/22/2013  ? Procedure: BREATH TEK H PYLORI;  Surgeon: Edward Jolly, MD;  Location: [facility];  Service: General;  Laterality: N/A;  ? CESAREAN SECTION  2004, 2007  ? x 2  ? COLONOSCOPY  2013  ? CYSTOSCOPY WITH URETHRAL DILATATION  age 89   ? GASTRIC ROUX-EN-Y N/A 10/31/2013  ? Procedure: LAPAROSCOPIC ROUX-EN-Y GASTRIC BYPASS WITH UPPER ENDOSCOPY;  Surgeon: Edward Jolly, MD;  Location: [facility];  Service: General;  Laterality: N/A;  ? GLUTEUS MINIMUS REPAIR Left 05/28/2021  ? Procedure: Left GLUTEUS Medius REPAIR;  Surgeon: Vanetta Mulders, MD;  Location: [place];  Service: Orthopedics;  Laterality: Left;  ? ingrown toenail Right   ? Ingrown nail on great right toe  ? kidney stent  08/28/2014  ? mole excision    ? "benign juvenile melanoma" removed from left leg - inner thigh  ? POLYPECTOMY    ? 2013  ? SKIN LESION EXCISION    ? back  ? TONSILLECTOMY  age 71  ? TUBAL  LIGATION  2018  ? ?Patient Active Problem List  ? Diagnosis Date Noted  ? Tear of left gluteus medius tendon   ? Vitamin B 12 deficiency 10/16/2019  ? Elevated LFTs 10/16/2019  ? Asthma, exogenous, unspecified asthma severity, uncomplicated 27/09/2374  ? Multiple environmental allergies 08/29/2018  ? B12 deficiency anemia 06/21/2018  ? Venous insufficiency 04/23/2018  ? Onychomycosis of toenail 04/17/2018  ? Chronic myofascial pain 03/18/2018  ? Small fiber neuropathy 03/18/2018  ? Idiopathic small fiber sensory neuropathy 02/23/2018  ? Varicose veins of both lower extremities 01/13/2018  ? Generalized pain 10/16/2017  ? Irregular menses 10/16/2017  ? Cervical spondylosis without myelopathy 08/28/2017  ? Migraine with aura and without status migrainosus, not intractable 07/22/2017  ? Lumbar degenerative disc disease 07/20/2017  ? Spondylosis of lumbar spine 07/20/2017  ? Coccydynia 03/12/2017  ? Bariatric surgery status 05/02/2016  ? Major depressive disorder, recurrent episode, moderate ([place]) 02/18/2016  ? Tachy-brady syndrome ([place])  12/19/2015  ? Candidal vulvovaginitis 10/15/2015  ? Iron deficiency anemia 06/10/2015  ? Sleep disorder 06/01/2013  ? Generalized anxiety disorder 05/30/2013  ? Chronic headaches 07/01/2012  ? Depression, major, recurrent, severe with psychosis ([place]) 06/17/2012  ? Fatigue 03/05/2012  ? Family hx colonic polyps 03/03/2012  ? Eating disorder 12/18/2011  ? POLYCYSTIC OVARIAN DISEASE 09/01/2008  ? Intermittent left-sided chest pain 08/02/2008  ? ? ?PCP: Kathyrn Drown, MD ? ?REFERRING PROVIDER: Vanetta Mulders, Md ? ?REFERRING DIAG: S76.012A (ICD-10-CM) - Tear of left gluteus medius tendon, initial encounter ? ?THERAPY DIAG:  ?Pain in left hip ? ?Difficulty in walking, not elsewhere classified ? ?Muscle weakness (generalized) ? ?Stiffness of left hip, not elsewhere classified ? ?ONSET DATE: 05/28/2021  ? ?SUBJECTIVE:  ? ?SUBJECTIVE STATEMENT: ?Pt states she is walking with the AD. She has been able to do more but still having pain. She is now able to put on her sock compared to before. She still has lots of soreness into the hip.  ? ? ?PERTINENT HISTORY: ?-Left hip gluteus medius repair open and open trochanteric bursectomy 05/28/2021. ?-Anemia, Depression, anxiety, Hx of blood clots in 2018 , small fiber neuropathy, anorexia, Breast reconstruction surgery in August 2022, gastric bypass surgery in 2015 ? ? ?PAIN:  ?Are you having pain? Yes ?NPRS scale: 4/10 ?Sharp and stabbing with certain motions, aching at rest ?Pain location: anterior area of hip is a new pain today ? ? ?PRECAUTIONS: Other: surgical protocol; small fiber neuropathy  ? ?WEIGHT BEARING RESTRICTIONS Yes [place] ? ? ?LIVING ENVIRONMENT: ?Lives with: lives with their family; husband and son ?Lives in: House/apartment; 2 story home, but stays on 1st floor ?Stairs: Yes; 2 steps to enter home without rail ?Has following equipment at home: Single point cane, Walker - 2 wheeled, and Crutches ? ?OCCUPATION: Pt is on disability ? ?PLOF: Independent; Pt ambulated  without an AD.  Husband and son did most of the chores but she performed light chores.  Pt hasn't been able to perform stairs for awhile. ? ?PATIENT GOALS pt wants to be able to lie on her side, dress independently, reduce pain, return to normal walking.  Wants to be able to perform stairs.  ? ? ?OBJECTIVE:  ? ?FOTO 36 at 10th visit ? ?L hip flexion WNL ?Hip ext 0 deg ?Hip IR WNL ?Hip ER 35 deg ?Hip ABD WNL ? ?MMT  4/5 throughout on L, no pain with MMT ? ?Gait: No AD used, decreased step length on R, decreased hip extension ? ?DIAGNOSTIC FINDINGS: MRI  prior to surgery ? ? ?TODAY'S TREATMENT: ? ?3/30 ? ?Seated QL stretch 5s 15x ?Supine SKTC 10s 10x ?Supine piriformis stretch 30s 3x ?LTR 5x each side  ? ?STS from table table height 3x5 ?Sidestepping along table 4x  YTB at knees ?Standing hip hinge with UE support 2x10 ?Standing march 20x with UE support ?L SLS with R toe touch 15s 4x ?Gastroc stretch at table 30s 3x ? ? ?3/23 ? ?Seated lumbar flexion stretch 5s 15x ?Supine SKTC 10s 10x ?Supine piriformis stretch 30s 3x ?Standing ITB stretch 30s 3x ?Sidestepping along table 4x  ?Standing hip hinge with UE support 2x10 ?Standing ITB stretch 3s 10x ?4" step up 4x5 ? ?3/17 ? ?Seated lumbar flexion stretch 5s 10x ?Quadruped LE extension 10x each ?Bridge 3x5 ?Standing hip ABD 2x10 ?Standing HS curl 2x10 ?Sidestepping along table 4x  ?Standing hip hinge with UE support 2x10 ?Standing ITB stretch 3s 10x ? ? ?3/9 ?PROM hip into 100 deg of flexion ?Supine SKTC 5s 10x ?Bridge 3x5 ?Standing hip ABD 2x10 ?Standing HS curl 2x10 ?Sidestepping along table 4x  ?Standing hip hinge with UE support 2x10 ?Standing cycle L 2x10 ?HR 2x10 ? ?Gait: SPC training/walking; sequencing, safety, position/ergonomics ? ?2/28: ?MANUAL: edema mobilization around incision site- stitch protruding at most distal end without s/s of infection & covered with bandaid ?Qped on shuttle single leg press #6  ?Standing hip flexion taps on 8' step- focus on weight  shift ?Heel raises ? ?2/23:  ? ?Stand weight shift 5s 2x10 ?Standing glut sets 2s 2x10 ?Quadruped hip rocking 2s 2x10 ?Standing HR 2x10 ?Standing mini squats 3x5 ?Heel toe rocking with bilat ax crutch 10x

## 2021-08-01 ENCOUNTER — Encounter (HOSPITAL_BASED_OUTPATIENT_CLINIC_OR_DEPARTMENT_OTHER): Payer: PPO | Admitting: Physical Therapy

## 2021-08-01 DIAGNOSIS — F331 Major depressive disorder, recurrent, moderate: Secondary | ICD-10-CM | POA: Diagnosis not present

## 2021-08-08 ENCOUNTER — Ambulatory Visit (INDEPENDENT_AMBULATORY_CARE_PROVIDER_SITE_OTHER): Payer: PPO | Admitting: Family Medicine

## 2021-08-08 VITALS — BP 112/70 | HR 89 | Temp 97.4°F | Ht 65.5 in

## 2021-08-08 DIAGNOSIS — R030 Elevated blood-pressure reading, without diagnosis of hypertension: Secondary | ICD-10-CM | POA: Diagnosis not present

## 2021-08-08 DIAGNOSIS — G608 Other hereditary and idiopathic neuropathies: Secondary | ICD-10-CM

## 2021-08-08 DIAGNOSIS — N261 Atrophy of kidney (terminal): Secondary | ICD-10-CM

## 2021-08-08 MED ORDER — FLUCONAZOLE 150 MG PO TABS: ORAL_TABLET | ORAL | 1 refills | Status: DC

## 2021-08-08 NOTE — Telephone Encounter (Signed)
We are setting her up with nephrology ?

## 2021-08-08 NOTE — Progress Notes (Signed)
? ?  Subjective:  ? ? Patient ID: Katelyn [name], female    DOB: 19-Jul-1975, 46 y.o.   MRN: [id number] ? ?HPI ?4 month Follow up for chronic medical conditions/ no concerns voiced  ?Patient has chronic hip pain discomfort she is doing physical therapy ?She is also having a lot of body aches fatigue tiredness.  She is disabled.  In addition to this she has atrophy of the kidney that she is concerned that her small fiber neuropathy may have caused this.  It is difficult to know. ?Her blood pressure readings at home have been elevated.  She is trying to watch salt in her diet try to stay as active as physically she can ?Review of Systems ? ?   ?Objective:  ? Physical Exam ? ?General-in no acute distress ?Eyes-no discharge ?Lungs-respiratory rate normal, CTA ?CV-no murmurs,RRR ?Extremities skin warm dry no edema ?Neuro grossly normal ?Behavior normal, alert ? ? ? ?   ?Assessment & Plan:  ?1. Elevated blood pressure reading ?Blood pressure reading has been elevated at her home but here blood pressure reading was good I encourage patient to bring her monitor check a blood pressure reading with monitor check your readings with our unit-preferably wall unit-and see how close it comes together.  This will be shown to me then may need to initiate medication ? ?2. Idiopathic small fiber sensory neuropathy ?She is doing the best she can with her current medications still has a lot of pain and discomfort ? ?3. Atrophic kidney ?Patient is very concerned that her small fiber neuropathy is causing additional issues.  She is concerned that this causes one of her kidneys to atrophy.  I think she would benefit from seeing a nephrologist because she is down to 1 kidney.  There may not necessarily be much they will do other than check her over and give her advice then refer her back to [place].  But it is reasonable for her to be seen ?- Ambulatory referral to Nephrology ? ? ?

## 2021-08-09 ENCOUNTER — Encounter (HOSPITAL_BASED_OUTPATIENT_CLINIC_OR_DEPARTMENT_OTHER): Payer: Self-pay | Admitting: Physical Therapy

## 2021-08-09 ENCOUNTER — Ambulatory Visit (HOSPITAL_BASED_OUTPATIENT_CLINIC_OR_DEPARTMENT_OTHER): Payer: PPO | Attending: Orthopaedic Surgery | Admitting: Physical Therapy

## 2021-08-09 DIAGNOSIS — M25652 Stiffness of left hip, not elsewhere classified: Secondary | ICD-10-CM | POA: Insufficient documentation

## 2021-08-09 DIAGNOSIS — M25552 Pain in left hip: Secondary | ICD-10-CM | POA: Diagnosis not present

## 2021-08-09 DIAGNOSIS — R262 Difficulty in walking, not elsewhere classified: Secondary | ICD-10-CM | POA: Diagnosis not present

## 2021-08-09 DIAGNOSIS — M6281 Muscle weakness (generalized): Secondary | ICD-10-CM | POA: Insufficient documentation

## 2021-08-09 NOTE — Therapy (Signed)
?OUTPATIENT PHYSICAL THERAPY PROGRESS NOTE ? ? ? ? ?Patient Name: Katelyn [name] ?MRN: [id number] ?DOB:May 19, 1975, 46 y.o., female ?Today's Date: 08/09/2021 ? ? PT End of Session - 08/09/21 1017   ? ? Visit Number 11   ? Number of Visits 25   ? Date for PT Re-Evaluation 08/23/21   ? Authorization Type HTA   ? PT Start Time 1015   ? PT Stop Time 2683   ? PT Time Calculation (min) 38 min   ? Activity Tolerance Patient tolerated treatment well   ? Behavior During Therapy [facility] for tasks assessed/performed   ? ?  ?  ? ?  ? ? ? ? ? ? ? ? ? ?Past Medical History:  ?Diagnosis Date  ? Anemia   ? Anorexia   ? Anxiety   ? Asthma   ? controlled with meds  ? Benign juvenile melanoma   ? Chronic headaches   ? Colon polyps   ? found on colonoscopy 04/26/2012  ? Complication of anesthesia   ? itching after epidural for c section  ? Constipation   ? Depression   ? Depression   ? several suicide attempts, hospitaluzed in 2012 for this; hx pf ECT treatments ; pt sees Dr. Adele Schilder pyschiatrist  and doing well on medication  ? Dysrhythmia   ? hx of PVC's (pt hasn't seen cardiologist since 2021- no follow up needed)  ? GERD (gastroesophageal reflux disease)   ? Headache   ? Heart murmur   ? as a child - no one mentions hearing murmur anymore, sometimes it is heard. Echo has been performed 02/03/18  ? Heartburn   ? no meds  ? History of pneumonia   ? x 3  years ago - no recent problems  ? Hx of blood clots   ? Neuromuscular disorder ([place])   ? small fiber neuropathy  ? Obesity   ? Pneumonia   ? Polycystic ovary   ? takes metformin to treat  ? Renal atrophy, left 2023  ? Skin lesion   ? Excisional biopsy of moles - none cancerous  ? Sleep apnea   ? yrs ago - diagnosed mild sleep apnea - did not have to use cpap   ? ?Past Surgical History:  ?Procedure Laterality Date  ? BREAST CAPSULECTOMY Bilateral 12/13/2020  ? 1 week later pt had hematoma surgery on left breast  ? BREAST ENHANCEMENT SURGERY Bilateral 02/11/2017  ? BREATH TEK H PYLORI  N/A 07/22/2013  ? Procedure: BREATH TEK H PYLORI;  Surgeon: Edward Jolly, MD;  Location: [facility];  Service: General;  Laterality: N/A;  ? CESAREAN SECTION  2004, 2007  ? x 2  ? COLONOSCOPY  2013  ? CYSTOSCOPY WITH URETHRAL DILATATION  age 8   ? GASTRIC ROUX-EN-Y N/A 10/31/2013  ? Procedure: LAPAROSCOPIC ROUX-EN-Y GASTRIC BYPASS WITH UPPER ENDOSCOPY;  Surgeon: Edward Jolly, MD;  Location: [facility];  Service: General;  Laterality: N/A;  ? GLUTEUS MINIMUS REPAIR Left 05/28/2021  ? Procedure: Left GLUTEUS Medius REPAIR;  Surgeon: Vanetta Mulders, MD;  Location: [place];  Service: Orthopedics;  Laterality: Left;  ? ingrown toenail Right   ? Ingrown nail on great right toe  ? kidney stent  08/28/2014  ? mole excision    ? "benign juvenile melanoma" removed from left leg - inner thigh  ? POLYPECTOMY    ? 2013  ? SKIN LESION EXCISION    ? back  ? TONSILLECTOMY  age 32  ? TUBAL  LIGATION  2018  ? ?Patient Active Problem List  ? Diagnosis Date Noted  ? Tear of left gluteus medius tendon   ? Vitamin B 12 deficiency 10/16/2019  ? Elevated LFTs 10/16/2019  ? Asthma, exogenous, unspecified asthma severity, uncomplicated 14/78/2956  ? Multiple environmental allergies 08/29/2018  ? B12 deficiency anemia 06/21/2018  ? Venous insufficiency 04/23/2018  ? Onychomycosis of toenail 04/17/2018  ? Chronic myofascial pain 03/18/2018  ? Small fiber neuropathy 03/18/2018  ? Idiopathic small fiber sensory neuropathy 02/23/2018  ? Varicose veins of both lower extremities 01/13/2018  ? Generalized pain 10/16/2017  ? Irregular menses 10/16/2017  ? Cervical spondylosis without myelopathy 08/28/2017  ? Migraine with aura and without status migrainosus, not intractable 07/22/2017  ? Lumbar degenerative disc disease 07/20/2017  ? Spondylosis of lumbar spine 07/20/2017  ? Coccydynia 03/12/2017  ? Bariatric surgery status 05/02/2016  ? Major depressive disorder, recurrent episode, moderate ([place]) 02/18/2016  ? Tachy-brady syndrome ([place])  12/19/2015  ? Candidal vulvovaginitis 10/15/2015  ? Iron deficiency anemia 06/10/2015  ? Sleep disorder 06/01/2013  ? Generalized anxiety disorder 05/30/2013  ? Chronic headaches 07/01/2012  ? Depression, major, recurrent, severe with psychosis ([place]) 06/17/2012  ? Fatigue 03/05/2012  ? Family hx colonic polyps 03/03/2012  ? Eating disorder 12/18/2011  ? POLYCYSTIC OVARIAN DISEASE 09/01/2008  ? Intermittent left-sided chest pain 08/02/2008  ? ? ?PCP: Kathyrn Drown, MD ? ?REFERRING PROVIDER: Vanetta Mulders, Md ? ?REFERRING DIAG: S76.012A (ICD-10-CM) - Tear of left gluteus medius tendon, initial encounter ? ?THERAPY DIAG:  ?Pain in left hip ? ?Difficulty in walking, not elsewhere classified ? ?Muscle weakness (generalized) ? ?Stiffness of left hip, not elsewhere classified ? ?ONSET DATE: 05/28/2021  ? ?SUBJECTIVE:  ? ?SUBJECTIVE STATEMENT: ?Still unable to sit on a toilet seat- the motion of up/down feel like I don't have the muscles to do it. Able to do the exercises stepping up with Left foot but not strong enough for a flight of stairs. This was the first time I was able to ride in the car the way I did before surgery.  ? ? ?PERTINENT HISTORY: ?-Left hip gluteus medius repair open and open trochanteric bursectomy 05/28/2021. ?-Anemia, Depression, anxiety, Hx of blood clots in 2018 , small fiber neuropathy, anorexia, Breast reconstruction surgery in August 2022, gastric bypass surgery in 2015 ? ? ?PAIN:  ?Are you having pain? Yes ?NPRS scale: 5/10 ?Sore/achey after riding in car.  ?Pain location: lateral hip ? ? ?PRECAUTIONS: Other: surgical protocol; small fiber neuropathy  ? ?WEIGHT BEARING RESTRICTIONS No ? ? ?LIVING ENVIRONMENT: ?Lives with: lives with their family; husband and son ?Lives in: House/apartment; 2 story home, but stays on 1st floor ?Stairs: Yes; 2 steps to enter home without rail ?Has following equipment at home: Single point cane, Walker - 2 wheeled, and Crutches ? ?OCCUPATION: Pt is on  disability ? ?PLOF: Independent; Pt ambulated without an AD.  Husband and son did most of the chores but she performed light chores.  Pt hasn't been able to perform stairs for awhile. ? ?PATIENT GOALS pt wants to be able to lie on her side, dress independently, reduce pain, return to normal walking.  Wants to be able to perform stairs.  ? ? ?OBJECTIVE:  ? ?FOTO 36 at 10th visit ? ?L hip flexion WNL ?Hip ext 0 deg ?Hip IR WNL ?Hip ER 35 deg ?Hip ABD WNL ? ?MMT  4/5 throughout on L, no pain with MMT ? ?Gait: No AD used, decreased step length  on R, decreased hip extension ? ?DIAGNOSTIC FINDINGS: MRI prior to surgery ? ? ?TODAY'S TREATMENT: ? ?4/14:  ?At bar: pull off squat, side lunges, hip hinge with straight legs pull off, heel raise with glut set, standing hip abd ?IASTM Lt hip at incision- scar tissue that travels anterior was addressed ?Sidelying clams ?Standing march to 2" step up for CKC hip abd activaiton ? ?3/30 ? ?Seated QL stretch 5s 15x ?Supine SKTC 10s 10x ?Supine piriformis stretch 30s 3x ?LTR 5x each side  ? ?STS from table table height 3x5 ?Sidestepping along table 4x  YTB at knees ?Standing hip hinge with UE support 2x10 ?Standing march 20x with UE support ?L SLS with R toe touch 15s 4x ?Gastroc stretch at table 30s 3x ? ? ?3/23 ? ?Seated lumbar flexion stretch 5s 15x ?Supine SKTC 10s 10x ?Supine piriformis stretch 30s 3x ?Standing ITB stretch 30s 3x ?Sidestepping along table 4x  ?Standing hip hinge with UE support 2x10 ?Standing ITB stretch 3s 10x ?4" step up 4x5 ? ? ? ?PATIENT EDUCATION:  ?Education details: exam findings, exercise progression, DOMS expectations, muscle firing,  HEP, POC ?Person educated: Patient ?Education method: Explanation, Demonstration, Verbal cues, and Handouts ?Education comprehension: verbalized understanding, returned demonstration, verbal cues required, and needs further education ? ? ?HOME EXERCISE PROGRAM: ?Access Code: [email or web address] ?URL:  [email or web address] ? ? ?ASSESSMENT: ? ?CLINICAL IMPRESSION: ?Scar tissue noted over activation of glut med creating discomfort in closed chain activation- sore after IASTM as expected. Notable trendelenburg in Rt SLS when stepping le

## 2021-08-13 ENCOUNTER — Other Ambulatory Visit: Payer: PPO

## 2021-08-13 DIAGNOSIS — M461 Sacroiliitis, not elsewhere classified: Secondary | ICD-10-CM | POA: Diagnosis not present

## 2021-08-13 DIAGNOSIS — G8929 Other chronic pain: Secondary | ICD-10-CM | POA: Diagnosis not present

## 2021-08-13 DIAGNOSIS — Z79891 Long term (current) use of opiate analgesic: Secondary | ICD-10-CM | POA: Diagnosis not present

## 2021-08-13 DIAGNOSIS — G894 Chronic pain syndrome: Secondary | ICD-10-CM | POA: Diagnosis not present

## 2021-08-13 DIAGNOSIS — M5136 Other intervertebral disc degeneration, lumbar region: Secondary | ICD-10-CM | POA: Diagnosis not present

## 2021-08-13 DIAGNOSIS — M533 Sacrococcygeal disorders, not elsewhere classified: Secondary | ICD-10-CM | POA: Diagnosis not present

## 2021-08-13 DIAGNOSIS — M7918 Myalgia, other site: Secondary | ICD-10-CM | POA: Diagnosis not present

## 2021-08-15 ENCOUNTER — Ambulatory Visit (HOSPITAL_BASED_OUTPATIENT_CLINIC_OR_DEPARTMENT_OTHER): Payer: PPO | Admitting: Physical Therapy

## 2021-08-16 DIAGNOSIS — F331 Major depressive disorder, recurrent, moderate: Secondary | ICD-10-CM | POA: Diagnosis not present

## 2021-08-21 ENCOUNTER — Encounter (HOSPITAL_BASED_OUTPATIENT_CLINIC_OR_DEPARTMENT_OTHER): Payer: PPO | Admitting: Orthopaedic Surgery

## 2021-08-22 ENCOUNTER — Ambulatory Visit (HOSPITAL_BASED_OUTPATIENT_CLINIC_OR_DEPARTMENT_OTHER): Payer: PPO | Admitting: Physical Therapy

## 2021-08-29 ENCOUNTER — Encounter (HOSPITAL_BASED_OUTPATIENT_CLINIC_OR_DEPARTMENT_OTHER): Payer: Self-pay | Admitting: Physical Therapy

## 2021-08-29 ENCOUNTER — Ambulatory Visit (HOSPITAL_BASED_OUTPATIENT_CLINIC_OR_DEPARTMENT_OTHER): Payer: PPO | Attending: Orthopaedic Surgery | Admitting: Physical Therapy

## 2021-08-29 DIAGNOSIS — M25652 Stiffness of left hip, not elsewhere classified: Secondary | ICD-10-CM | POA: Diagnosis not present

## 2021-08-29 DIAGNOSIS — R262 Difficulty in walking, not elsewhere classified: Secondary | ICD-10-CM | POA: Diagnosis not present

## 2021-08-29 DIAGNOSIS — M25552 Pain in left hip: Secondary | ICD-10-CM | POA: Insufficient documentation

## 2021-08-29 DIAGNOSIS — M6281 Muscle weakness (generalized): Secondary | ICD-10-CM | POA: Insufficient documentation

## 2021-08-29 NOTE — Therapy (Addendum)
OUTPATIENT PHYSICAL THERAPY RE-CERTIFICATION NOTE  PHYSICAL THERAPY DISCHARGE SUMMARY  Visits from Start of Care: 12  Plan: Patient agrees to discharge.  Patient goals were not met. Patient is being discharged due to not returning to therapy.         Patient Name: Katelyn [name] MRN: [id number] DOB:1975-11-03, 46 y.o., female Today's Date: 08/29/2021   PT End of Session - 08/29/21 0822     Visit Number 12    Number of Visits 25    Date for PT Re-Evaluation 11/21/21    Authorization Type HTA    PT Start Time 0801    PT Stop Time 0821    PT Time Calculation (min) 20 min    Activity Tolerance Patient tolerated treatment well    Behavior During Therapy [facility] for tasks assessed/performed                     Past Medical History:  Diagnosis Date   Anemia    Anorexia    Anxiety    Asthma    controlled with meds   Benign juvenile melanoma    Chronic headaches    Colon polyps    found on colonoscopy 27/09/2374   Complication of anesthesia    itching after epidural for c section   Constipation    Depression    Depression    several suicide attempts, hospitaluzed in 2012 for this; hx pf ECT treatments ; pt sees Dr. Adele Schilder pyschiatrist  and doing well on medication   Dysrhythmia    hx of PVC's (pt hasn't seen cardiologist since 2021- no follow up needed)   GERD (gastroesophageal reflux disease)    Headache    Heart murmur    as a child - no one mentions hearing murmur anymore, sometimes it is heard. Echo has been performed 02/03/18   Heartburn    no meds   History of pneumonia    x 3  years ago - no recent problems   Hx of blood clots    Neuromuscular disorder (HCC)    small fiber neuropathy   Obesity    Pneumonia    Polycystic ovary    takes metformin to treat   Renal atrophy, left 2023   Skin lesion    Excisional biopsy of moles - none cancerous   Sleep apnea    yrs ago - diagnosed mild sleep apnea - did not have to use cpap    Past  Surgical History:  Procedure Laterality Date   BREAST CAPSULECTOMY Bilateral 12/13/2020   1 week later pt had hematoma surgery on left breast   BREAST ENHANCEMENT SURGERY Bilateral 02/11/2017   BREATH TEK H PYLORI N/A 07/22/2013   Procedure: BREATH TEK H PYLORI;  Surgeon: Edward Jolly, MD;  Location: [facility];  Service: General;  Laterality: N/A;   CESAREAN SECTION  2004, 2007   x 2   COLONOSCOPY  2013   CYSTOSCOPY WITH URETHRAL DILATATION  age 79    GASTRIC ROUX-EN-Y N/A 10/31/2013   Procedure: LAPAROSCOPIC ROUX-EN-Y GASTRIC BYPASS WITH UPPER ENDOSCOPY;  Surgeon: Edward Jolly, MD;  Location: [facility];  Service: General;  Laterality: N/A;   GLUTEUS MINIMUS REPAIR Left 05/28/2021   Procedure: Left [place];  Surgeon: Vanetta Mulders, MD;  Location: [place];  Service: Orthopedics;  Laterality: Left;   ingrown toenail Right    Ingrown nail on great right toe   kidney stent  08/28/2014   mole excision     "  benign juvenile melanoma" removed from left leg - inner thigh   POLYPECTOMY     2013   SKIN LESION EXCISION     back   TONSILLECTOMY  age 49   TUBAL LIGATION  2018   Patient Active Problem List   Diagnosis Date Noted   Tear of left gluteus medius tendon    Vitamin B 12 deficiency 10/16/2019   Elevated LFTs 10/16/2019   Asthma, exogenous, unspecified asthma severity, uncomplicated 16/01/9603   Multiple environmental allergies 08/29/2018   B12 deficiency anemia 06/21/2018   Venous insufficiency 04/23/2018   Onychomycosis of toenail 04/17/2018   Chronic myofascial pain 03/18/2018   Small fiber neuropathy 03/18/2018   Idiopathic small fiber sensory neuropathy 02/23/2018   Varicose veins of both lower extremities 01/13/2018   Generalized pain 10/16/2017   Irregular menses 10/16/2017   Cervical spondylosis without myelopathy 08/28/2017   Migraine with aura and without status migrainosus, not intractable 07/22/2017   Lumbar degenerative disc disease  07/20/2017   Spondylosis of lumbar spine 07/20/2017   Coccydynia 03/12/2017   Bariatric surgery status 05/02/2016   Major depressive disorder, recurrent episode, moderate (HCC) 02/18/2016   Tachy-brady syndrome ([place]) 12/19/2015   Candidal vulvovaginitis 10/15/2015   Iron deficiency anemia 06/10/2015   Sleep disorder 06/01/2013   Generalized anxiety disorder 05/30/2013   Chronic headaches 07/01/2012   Depression, major, recurrent, severe with psychosis ([place]) 06/17/2012   Fatigue 03/05/2012   Family hx colonic polyps 03/03/2012   Eating disorder 12/18/2011   POLYCYSTIC OVARIAN DISEASE 09/01/2008   Intermittent left-sided chest pain 08/02/2008    PCP: Kathyrn Drown, MD  REFERRING PROVIDER: Vanetta Mulders, Md  REFERRING DIAG: [id number] (ICD-10-CM) - Tear of left gluteus medius tendon, initial encounter  THERAPY DIAG:  Pain in left hip - Plan: PT plan of care cert/re-cert  Muscle weakness (generalized) - Plan: PT plan of care cert/re-cert  Stiffness of left hip, not elsewhere classified - Plan: PT plan of care cert/re-cert  Difficulty in walking, not elsewhere classified - Plan: PT plan of care cert/re-cert  ONSET DATE: 01/10/7828   SUBJECTIVE:   SUBJECTIVE STATEMENT: Pt states the hip is just sore at this point vs the strength. She states that stairs and standing up from toilet is still difficult. She unfortunately has been in the hospital taking care of her daughter. She requests to take a break from PT due to family/daughter care needs. Pt requests HEP and progression of HEP to focus on for home until she is able to return.   Pt states she feels she has maybe gone backwards due to stay in the hospital for the last few weeks with her daughter.   PERTINENT HISTORY: -Left hip gluteus medius repair open and open trochanteric bursectomy 05/28/2021. -Anemia, Depression, anxiety, Hx of blood clots in 2018 , small fiber neuropathy, anorexia, Breast reconstruction surgery in August  2022, gastric bypass surgery in 2015   PAIN:  Are you having pain? Yes NPRS scale: 5/10 Sore/ache Pain location: lateral hip   PRECAUTIONS: Other: surgical protocol; small fiber neuropathy   WEIGHT BEARING RESTRICTIONS No   LIVING ENVIRONMENT: Lives with: lives with their family; husband and son Lives in: House/apartment; 2 story home, but stays on 1st floor Stairs: Yes; 2 steps to enter home without rail Has following equipment at home: Single point cane, Walker - 2 wheeled, and Crutches  OCCUPATION: Pt is on disability  PLOF: Independent; Pt ambulated without an AD.  Husband and son did most of the chores but  she performed light chores.  Pt hasn't been able to perform stairs for awhile.  PATIENT GOALS pt wants to be able to lie on her side, dress independently, reduce pain, return to normal walking.  Wants to be able to perform stairs.    OBJECTIVE:   FOTO 36 at 10th visit  L hip flexion WNL Hip ext 0 deg Hip IR WNL Hip ER 40 deg Hip ABD WNL  MMT  4/5 throughout on L, no pain with MMT  Gait: No AD used, decreased step length on R, decreased hip extension  Instability/ tetany with lateral lunging and SLS  DIAGNOSTIC FINDINGS: MRI prior to surgery   TODAY'S TREATMENT:  5/4 Full review of HEP, self regression/progression of HEP, walking program   Home Exercises - Supine Piriformis Stretch with Foot on Ground  - 2 x daily - 7 x weekly - 1 sets - 3 reps - 30 hold - ITB Stretch at Wall  - 1 x daily - 7 x weekly - 1 sets - 10 reps - 3 hold - Seated Quadratus Lumborum Stretch in Chair  - 2 x daily - 3-4 x weekly - 1 sets - 3 reps - 15 hold - Step Up  - 1 x daily - 3-4 x weekly - 4 sets - 5 reps - Squat with Counter Support  - 1 x daily - 3-4 x weekly - 5 sets - 5 reps - 5s hold - Beginner Clam  - 1 x daily - 3-4 x weekly - 3 sets - 10 reps - Sit to Stand  - 1 x daily - 3-4 x weekly - 5 sets - 5 reps - Side Stepping with Resistance at Thighs  - 1 x daily - 3-4 x  weekly - 1 sets - 4 reps - Side Lunge with Counter Support  - 1 x daily - 3-4 x weekly - 3 sets - 10 reps  4/14:  At bar: pull off squat, side lunges, hip hinge with straight legs pull off, heel raise with glut set, standing hip abd IASTM Lt hip at incision- scar tissue that travels anterior was addressed Sidelying clams Standing march to 2" step up for CKC hip abd activaiton    PATIENT EDUCATION:  Education details: self STM, walking parameters, exercise progression, DOMS expectations, muscle firing,  HEP, POC Person educated: Patient Education method: Explanation, Demonstration, Verbal cues, and Handouts Education comprehension: verbalized understanding, returned demonstration, verbal cues required, and needs further education   HOME EXERCISE PROGRAM: Access Code: [email or web address] URL: [email or web address]   ASSESSMENT:  CLINICAL IMPRESSION: Upon observation in clinic, pt does not appear to have lost significant functional mobility as compared to last session but continues to be limited with ADL and L LE strength as compared to daily needs. Pt re-certification today due to lapse in care due to family medical needs. Plan to continue with strengthening as tolerated as pt is able to return to therapy. Pt has good understanding of self progression of exercise and able to demo all HEP provided. Pt would benefit from continued skilled therapy in order to reach goals and maximize functional LE strength and ROM for progression towards goals and addressing mobility deficits.   Objective impairments include Abnormal gait, decreased activity tolerance, decreased endurance, decreased mobility, difficulty walking, decreased ROM, decreased strength, hypomobility, impaired flexibility, and pain. These impairments are limiting patient from cleaning, community activity, driving, meal prep, shopping, and ambulation . Personal factors including 3+ comorbidities: Depression, anxiety, small fiber  neuropathy and limited with  positions due to pain  are also affecting patient's functional outcome.   REHAB POTENTIAL: Good  CLINICAL DECISION MAKING: Evolving/moderate complexity  EVALUATION COMPLEXITY: Moderate   GOALS:   SHORT TERM GOALS:  STG Name Target Date Goal status  1 Pt will be independent and compliant with HEP for improved pain, strength, and function.   Baseline:  09/26/2021 MET  2 Pt will progress [place] per MD orders without adverse effects.  Baseline:  09/26/2021 MET  3 Pt will progress with passive hip motion per protocol for improved stiffness and mobility.  Baseline: 09/26/2021 MET  4 Pt will be able to perform car transfers without difficulty Baseline: 09/26/2021 MET  5 Pt will ambulate with a normalized heel to toe gait pattern without an AD without limping. Baseline: 09/26/2021 MET  6  Pt will progress with LE strengthening per protocol without adverse effects for improved tolerance with daily act's and functional mobility.  Baseline: 09/26/2021 MET   LONG TERM GOALS:   LTG Name Target Date Goal status  1 Pt will be able to perform a 6 inch step up with good form and control without significant pain. Baseline: 11/21/2021   ongoing  2 Pt will ambulate extended community distance without significant pain or limitation.  Baseline: 11/21/2021  ongoing  3 Pt will be able to perform her ADLs and IADLs including cooking for her family without significant pain or difficulty.  Baseline: 11/21/2021 MET  4 Pt will be able to perform stairs with a reciprocal gait with 1 rail. Baseline: 11/21/2021 ongoing  5 Pt will demo at least 4+/5 MMT strength in L hip flex, ER, knee ext, knee flex, and 4/5 hip abduction for improved tolerance with and performance of functional mobility skills Baseline: 11/21/2021 Partially met             PLAN: PT FREQUENCY: 1x/week PT DURATION: 12wks (likely D/C in 6 weeks)  PLANNED INTERVENTIONS: Therapeutic exercises, Therapeutic activity, Neuro  Muscular re-education, Balance training, Gait training, Patient/Family education, Joint mobilization, Stair training, DME instructions, Aquatic Therapy, Electrical stimulation, Cryotherapy, Moist heat, Taping, Vasopneumatic device, Ultrasound, and Manual therapy  PLAN FOR NEXT SESSION: Cont per Dr. Rosebud Poles Medius Repair and MD orders.  Unable to exercise in seated and supine. Cont CKC  Daleen Bo PT, DPT 08/29/21 8:36 AM

## 2021-08-30 ENCOUNTER — Encounter: Payer: Self-pay | Admitting: Family Medicine

## 2021-08-30 DIAGNOSIS — B3731 Acute candidiasis of vulva and vagina: Secondary | ICD-10-CM | POA: Diagnosis not present

## 2021-08-30 DIAGNOSIS — N898 Other specified noninflammatory disorders of vagina: Secondary | ICD-10-CM | POA: Diagnosis not present

## 2021-08-30 DIAGNOSIS — Z01419 Encounter for gynecological examination (general) (routine) without abnormal findings: Secondary | ICD-10-CM | POA: Diagnosis not present

## 2021-08-30 DIAGNOSIS — Z113 Encounter for screening for infections with a predominantly sexual mode of transmission: Secondary | ICD-10-CM | POA: Diagnosis not present

## 2021-08-30 DIAGNOSIS — Z124 Encounter for screening for malignant neoplasm of cervix: Secondary | ICD-10-CM | POA: Diagnosis not present

## 2021-08-30 DIAGNOSIS — Z1231 Encounter for screening mammogram for malignant neoplasm of breast: Secondary | ICD-10-CM | POA: Diagnosis not present

## 2021-08-30 DIAGNOSIS — Z0142 Encounter for cervical smear to confirm findings of recent normal smear following initial abnormal smear: Secondary | ICD-10-CM | POA: Diagnosis not present

## 2021-08-30 DIAGNOSIS — Z01411 Encounter for gynecological examination (general) (routine) with abnormal findings: Secondary | ICD-10-CM | POA: Diagnosis not present

## 2021-09-03 DIAGNOSIS — F331 Major depressive disorder, recurrent, moderate: Secondary | ICD-10-CM | POA: Diagnosis not present

## 2021-09-04 ENCOUNTER — Encounter: Payer: Self-pay | Admitting: Family Medicine

## 2021-09-06 ENCOUNTER — Ambulatory Visit (INDEPENDENT_AMBULATORY_CARE_PROVIDER_SITE_OTHER): Payer: PPO | Admitting: Orthopaedic Surgery

## 2021-09-06 DIAGNOSIS — S76012A Strain of muscle, fascia and tendon of left hip, initial encounter: Secondary | ICD-10-CM | POA: Diagnosis not present
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
## 2021-09-10 ENCOUNTER — Inpatient Hospital Stay (HOSPITAL_COMMUNITY): Payer: PPO | Attending: Hematology

## 2021-09-10 DIAGNOSIS — E611 Iron deficiency: Secondary | ICD-10-CM | POA: Diagnosis not present

## 2021-09-10 DIAGNOSIS — Z9884 Bariatric surgery status: Secondary | ICD-10-CM | POA: Diagnosis not present

## 2021-09-10 DIAGNOSIS — K760 Fatty (change of) liver, not elsewhere classified: Secondary | ICD-10-CM | POA: Insufficient documentation

## 2021-09-10 DIAGNOSIS — G2581 Restless legs syndrome: Secondary | ICD-10-CM | POA: Diagnosis not present

## 2021-09-10 DIAGNOSIS — D7589 Other specified diseases of blood and blood-forming organs: Secondary | ICD-10-CM

## 2021-09-10 DIAGNOSIS — D508 Other iron deficiency anemias: Secondary | ICD-10-CM

## 2021-09-10 LAB — COMPREHENSIVE METABOLIC PANEL
ALT: 22 U/L (ref 0–44)
AST: 18 U/L (ref 15–41)
Albumin: 4.4 g/dL (ref 3.5–5.0)
Alkaline Phosphatase: 123 U/L (ref 38–126)
Anion gap: 7 (ref 5–15)
BUN: 12 mg/dL (ref 6–20)
CO2: 28 mmol/L (ref 22–32)
Calcium: 9 mg/dL (ref 8.9–10.3)
Chloride: 107 mmol/L (ref 98–111)
Creatinine, Ser: 0.63 mg/dL (ref 0.44–1.00)
GFR, Estimated: >60 mL/min (ref >60)
Glucose, Bld: 102 mg/dL — ABNORMAL HIGH (ref 70–99)
Potassium: 3.9 mmol/L (ref 3.5–5.1)
Sodium: 142 mmol/L (ref 135–145)
Total Bilirubin: 0.6 mg/dL (ref 0.3–1.2)
Total Protein: 7.5 g/dL (ref 6.5–8.1)

## 2021-09-10 LAB — IRON AND TIBC
Iron: 82 ug/dL (ref 28–170)
Saturation Ratios: 27 % (ref 10.4–31.8)
TIBC: 302 ug/dL (ref 250–450)
UIBC: 220 ug/dL

## 2021-09-10 LAB — CBC WITH DIFFERENTIAL/PLATELET
Abs Immature Granulocytes: 0.04 10*3/uL (ref 0.00–0.07)
Basophils Absolute: 0 10*3/uL (ref 0.0–0.1)
Basophils Relative: 0 %
Eosinophils Absolute: 0.1 10*3/uL (ref 0.0–0.5)
Eosinophils Relative: 1 %
HCT: 44.9 % (ref 36.0–46.0)
Hemoglobin: 14.9 g/dL (ref 12.0–15.0)
Immature Granulocytes: 1 %
Lymphocytes Relative: 20 %
Lymphs Abs: 1.7 10*3/uL (ref 0.7–4.0)
MCH: 32.5 pg (ref 26.0–34.0)
MCHC: 33.2 g/dL (ref 30.0–36.0)
MCV: 97.8 fL (ref 80.0–100.0)
Monocytes Absolute: 0.4 10*3/uL (ref 0.1–1.0)
Monocytes Relative: 5 %
Neutro Abs: 6.3 10*3/uL (ref 1.7–7.7)
Neutrophils Relative %: 73 %
Platelets: 380 10*3/uL (ref 150–400)
RBC: 4.59 MIL/uL (ref 3.87–5.11)
RDW: 12.6 % (ref 11.5–15.5)
WBC: 8.6 10*3/uL (ref 4.0–10.5)
nRBC: 0 % (ref 0.0–0.2)

## 2021-09-10 LAB — VITAMIN B12: Vitamin B-12: 1823 pg/mL — ABNORMAL HIGH (ref 180–914)

## 2021-09-10 LAB — FOLATE: Folate: >40 ng/mL (ref >5.9)

## 2021-09-10 LAB — FERRITIN: Ferritin: 101 ng/mL (ref 11–307)

## 2021-09-11 LAB — HOMOCYSTEINE: Homocysteine: 6.6 umol/L (ref 0.0–14.5)

## 2021-09-14 LAB — METHYLMALONIC ACID, SERUM: Methylmalonic Acid, Quantitative: 156 nmol/L (ref 0–378)

## 2021-09-16 DIAGNOSIS — F331 Major depressive disorder, recurrent, moderate: Secondary | ICD-10-CM | POA: Diagnosis not present

## 2021-09-16 NOTE — Progress Notes (Unsigned)
 Virtual Visit via Telephone Note [facility]  I connected with Katelyn [name]  on 09/17/21 at  8:51 AM by telephone and verified that I am speaking with the correct person using two identifiers.  Location: Patient: Home Provider: [facility]   I discussed the limitations, risks, security and privacy concerns of performing an evaluation and management service by telephone and the availability of in person appointments. I also discussed with the patient that there may be a patient responsible charge related to this service. The patient expressed understanding and agreed to proceed.   INTERVAL HISTORY: Katelyn [name] follows at our clinic for iron deficiency and B12 deficiency secondary to malabsorption from gastric bypass surgery and chronic PPI use.  She was last evaluated by   PA-C via telemedicine visit on 06/13/2021.  At today's visit, she reports feeling fair.  She is continuing to recover from surgical repair of left gluteus medius tear from 05/28/2021, but overall feels much better.    She continues to have chronic fatigue, but she reports it is somewhat improved from her baseline.  She reports headaches and restless legs.  She denies any pica, chest pain, dyspnea on exertion, lightheadedness, or syncope. She has not had any abnormal bleeding such as hematemesis, hematochezia, melena, or epistaxis. She reports that her menstrual periods are unchanged.    She is not currently taking any dietary vitamin or mineral supplements.   She has 60% energy and 100% appetite.  She is eating well to maintain a stable weight at this time.    OBSERVATIONS/OBJECTIVE: Review of Systems  Constitutional:  Positive for malaise/fatigue. Negative for chills, diaphoresis, fever and weight loss.  Respiratory:  Negative for cough and shortness of breath.   Cardiovascular:  Negative for chest pain and palpitations.  Gastrointestinal:  Positive  for nausea. Negative for abdominal pain, blood in stool, melena and vomiting.  Musculoskeletal:  Positive for back pain, joint pain and myalgias.  Neurological:  Positive for tingling and headaches. Negative for dizziness.  Psychiatric/Behavioral:  The patient has insomnia.     PHYSICAL EXAM (per limitations of virtual telephone visit): The patient is alert and oriented x 3, exhibiting adequate mentation, good mood, and ability to speak in full sentences and execute sound judgement.   ASSESSMENT & PLAN: 1.  Iron deficiency state: - Secondary to malabsorption in the setting of prior gastric bypass surgery and chronic PPI use. - Patient denies any signs or symptoms of abnormal blood loss; no bright red blood per rectum or melena.  Menses are regular, last 3 days, but with heavy blood loss and clots.     - She reports that her surgery in August 2022 (removal of breast implants) resulted in large hematoma requiring surgical drainage - Injectafer on 01/28/2019 and 05/04/2020 - patient had a reaction after her Injectafer treatment (fever, headaches, general malaise), despite being premedicated with Benadryl, Tylenol, and Solu-Medrol.  She had previous reaction to Feraheme and has multiple medication allergies. - Most recent IV iron (Venofer 200 mg x 5) from 03/15/2021 through 04/19/2021 - Most recent labs (09/10/2021): Normal Hgb 14.9/MCV 97.8, ferritin 101, iron saturation 27%. - Symptomatic with chronic fatigue - PLAN: No indication for IV iron at this time, since ferritin is at goal  (Fe > 100) - Due to history of side effects and mild reaction to Injectafer and Feraheme, we will premedicate with steroids for any future IV iron infusions.  - Discussed with patient that flulike syndrome   and malaise is a normal side effect in some patients, and does not constitute a medical allergy.  - We will recheck labs and RTC in 4 months.   2.  Macrocytosis, mild: - She has intermittent mild macrocytosis - She  had history of fatty infiltration of liver prior to her gastric bypass surgery (per abdominal US on 07/13/2009).  No subsequent imaging after surgery in 2015. - Additional work-up including SPEP and TSH was unremarkable.  LFTs normal.  Normal LDH, ESR, CRP, copper. - Most recent MCV normal at 97.8 (09/10/2021) - No vitamin R00 or folic acid deficiency per labs from 09/10/2021 - Most recent abdominal imaging with CT abdomen/renal stone study (10/14/2020): Unremarkable unenhanced appearance of liver, no focal lesion identified; spleen normal in size without focal abnormality - PLAN: Suspect macrocytosis possibly due to fatty liver disease.  In the absence of other hematologic abnormalities, we will continue to watch and wait at this time.   3.  Elevated B12 levels: - She had a persistently elevated B12 level since at least June of 2021 - She is not on any B12 supplements at this time.   - Unclear etiology, I have explained to her that this is most likely an acute phase reactant.  - PLAN: We will continue to watch and wait.  No immediate cause for concern from a hematology/oncology standpoint, patient may benefit from additional work-up by other specialists in this regard. - We will recheck B12, folate, methylmalonic acid, homocystine every 6-12 months.   4.  Erythrocytosis & thrombocytosis, RESOLVED - CBC (06/10/2021) showed elevated hemoglobin 15.6 and elevated platelets 572 - She has had occasional intermittent thrombocytosis, likely reactive - She has had occasional intermittent erythrocytosis - Suspect the most recent elevations are reactive following her recent surgery in February 2023 for left gluteus medius tear repair. - Most recent CBC (09/10/2021) shows normal Hgb 14.9 and normal platelets 380 - PLAN:  No suspicion for MPN at this time.   FOLLOW UP INSTRUCTIONS: Labs in 4 months Phone visit after labs    I discussed the assessment and treatment plan with the patient. The patient was  provided an opportunity to ask questions and all were answered. The patient agreed with the plan and demonstrated an understanding of the instructions.   The patient was advised to call back or seek an in-person evaluation if the symptoms worsen or if the condition fails to improve as anticipated.  I provided 18 minutes of non-face-to-face time during this encounter.   Harriett Rush, PA-C 09/17/21 9:38 AM

## 2021-09-17 ENCOUNTER — Inpatient Hospital Stay (HOSPITAL_BASED_OUTPATIENT_CLINIC_OR_DEPARTMENT_OTHER): Payer: PPO | Admitting: Physician Assistant

## 2021-09-17 DIAGNOSIS — D513 Other dietary vitamin B12 deficiency anemia: Secondary | ICD-10-CM

## 2021-09-17 DIAGNOSIS — D508 Other iron deficiency anemias: Secondary | ICD-10-CM

## 2021-09-18 ENCOUNTER — Other Ambulatory Visit (HOSPITAL_COMMUNITY)
Admission: RE | Admit: 2021-09-18 | Discharge: 2021-09-18 | Disposition: A | Discharge status: Home / Self Care | Payer: PPO | Source: Other Acute Inpatient Hospital | Attending: Family Medicine | Admitting: Family Medicine

## 2021-09-18 DIAGNOSIS — R509 Fever, unspecified: Secondary | ICD-10-CM | POA: Insufficient documentation

## 2021-09-25 ENCOUNTER — Other Ambulatory Visit (HOSPITAL_COMMUNITY)
Admission: RE | Admit: 2021-09-25 | Discharge: 2021-09-25 | Disposition: A | Discharge status: Home / Self Care | Payer: PPO | Source: Ambulatory Visit | Attending: Family Medicine | Admitting: Family Medicine

## 2021-09-25 ENCOUNTER — Ambulatory Visit (HOSPITAL_COMMUNITY)
Admission: RE | Admit: 2021-09-25 | Discharge: 2021-09-25 | Disposition: A | Discharge status: Home / Self Care | Payer: PPO | Source: Ambulatory Visit | Attending: Family Medicine | Admitting: Family Medicine

## 2021-09-25 ENCOUNTER — Ambulatory Visit (INDEPENDENT_AMBULATORY_CARE_PROVIDER_SITE_OTHER): Payer: PPO | Admitting: Family Medicine

## 2021-09-25 ENCOUNTER — Encounter: Payer: Self-pay | Admitting: Family Medicine

## 2021-09-25 VITALS — BP 134/72 | HR 124 | Temp 102.8°F

## 2021-09-25 DIAGNOSIS — R238 Other skin changes: Secondary | ICD-10-CM | POA: Diagnosis present

## 2021-09-25 DIAGNOSIS — R509 Fever, unspecified: Secondary | ICD-10-CM | POA: Insufficient documentation

## 2021-09-25 DIAGNOSIS — G4730 Sleep apnea, unspecified: Secondary | ICD-10-CM | POA: Diagnosis present

## 2021-09-25 DIAGNOSIS — Z881 Allergy status to other antibiotic agents status: Secondary | ICD-10-CM | POA: Diagnosis not present

## 2021-09-25 DIAGNOSIS — Z87891 Personal history of nicotine dependence: Secondary | ICD-10-CM | POA: Diagnosis not present

## 2021-09-25 DIAGNOSIS — G8929 Other chronic pain: Secondary | ICD-10-CM | POA: Diagnosis not present

## 2021-09-25 DIAGNOSIS — R21 Rash and other nonspecific skin eruption: Secondary | ICD-10-CM | POA: Diagnosis not present

## 2021-09-25 DIAGNOSIS — Z79899 Other long term (current) drug therapy: Secondary | ICD-10-CM | POA: Diagnosis not present

## 2021-09-25 DIAGNOSIS — M533 Sacrococcygeal disorders, not elsewhere classified: Secondary | ICD-10-CM | POA: Diagnosis present

## 2021-09-25 DIAGNOSIS — A419 Sepsis, unspecified organism: Secondary | ICD-10-CM | POA: Diagnosis not present

## 2021-09-25 DIAGNOSIS — E876 Hypokalemia: Secondary | ICD-10-CM | POA: Diagnosis not present

## 2021-09-25 DIAGNOSIS — R1013 Epigastric pain: Secondary | ICD-10-CM | POA: Diagnosis not present

## 2021-09-25 DIAGNOSIS — Z6833 Body mass index (BMI) 33.0-33.9, adult: Secondary | ICD-10-CM | POA: Diagnosis not present

## 2021-09-25 DIAGNOSIS — R112 Nausea with vomiting, unspecified: Secondary | ICD-10-CM | POA: Diagnosis present

## 2021-09-25 DIAGNOSIS — Z85820 Personal history of malignant melanoma of skin: Secondary | ICD-10-CM | POA: Diagnosis not present

## 2021-09-25 DIAGNOSIS — Z7951 Long term (current) use of inhaled steroids: Secondary | ICD-10-CM | POA: Diagnosis not present

## 2021-09-25 DIAGNOSIS — E669 Obesity, unspecified: Secondary | ICD-10-CM | POA: Diagnosis present

## 2021-09-25 DIAGNOSIS — G894 Chronic pain syndrome: Secondary | ICD-10-CM | POA: Diagnosis present

## 2021-09-25 DIAGNOSIS — J45909 Unspecified asthma, uncomplicated: Secondary | ICD-10-CM | POA: Diagnosis present

## 2021-09-25 DIAGNOSIS — Z880 Allergy status to penicillin: Secondary | ICD-10-CM | POA: Diagnosis not present

## 2021-09-25 DIAGNOSIS — G43909 Migraine, unspecified, not intractable, without status migrainosus: Secondary | ICD-10-CM | POA: Diagnosis present

## 2021-09-25 DIAGNOSIS — R079 Chest pain, unspecified: Secondary | ICD-10-CM | POA: Diagnosis not present

## 2021-09-25 DIAGNOSIS — Z20822 Contact with and (suspected) exposure to covid-19: Secondary | ICD-10-CM | POA: Diagnosis present

## 2021-09-25 DIAGNOSIS — Z9884 Bariatric surgery status: Secondary | ICD-10-CM | POA: Diagnosis not present

## 2021-09-25 DIAGNOSIS — F419 Anxiety disorder, unspecified: Secondary | ICD-10-CM | POA: Diagnosis present

## 2021-09-25 DIAGNOSIS — G039 Meningitis, unspecified: Secondary | ICD-10-CM | POA: Diagnosis present

## 2021-09-25 DIAGNOSIS — G629 Polyneuropathy, unspecified: Secondary | ICD-10-CM | POA: Diagnosis not present

## 2021-09-25 DIAGNOSIS — A879 Viral meningitis, unspecified: Secondary | ICD-10-CM | POA: Diagnosis present

## 2021-09-25 DIAGNOSIS — Z888 Allergy status to other drugs, medicaments and biological substances status: Secondary | ICD-10-CM | POA: Diagnosis not present

## 2021-09-25 DIAGNOSIS — M7918 Myalgia, other site: Secondary | ICD-10-CM | POA: Diagnosis not present

## 2021-09-25 DIAGNOSIS — K219 Gastro-esophageal reflux disease without esophagitis: Secondary | ICD-10-CM | POA: Diagnosis present

## 2021-09-25 LAB — POCT URINALYSIS DIPSTICK
Spec Grav, UA: >=1.03 — AB (ref 1.010–1.025)
pH, UA: 6 (ref 5.0–8.0)

## 2021-09-25 LAB — COMPREHENSIVE METABOLIC PANEL
ALT: 19 U/L (ref 0–44)
AST: 18 U/L (ref 15–41)
Albumin: 4.6 g/dL (ref 3.5–5.0)
Alkaline Phosphatase: 126 U/L (ref 38–126)
Anion gap: 6 (ref 5–15)
BUN: 12 mg/dL (ref 6–20)
CO2: 26 mmol/L (ref 22–32)
Calcium: 9.2 mg/dL (ref 8.9–10.3)
Chloride: 106 mmol/L (ref 98–111)
Creatinine, Ser: 0.73 mg/dL (ref 0.44–1.00)
GFR, Estimated: >60 mL/min (ref >60)
Glucose, Bld: 109 mg/dL — ABNORMAL HIGH (ref 70–99)
Potassium: 3.7 mmol/L (ref 3.5–5.1)
Sodium: 138 mmol/L (ref 135–145)
Total Bilirubin: 0.4 mg/dL (ref 0.3–1.2)
Total Protein: 8.1 g/dL (ref 6.5–8.1)

## 2021-09-25 LAB — CBC WITH DIFFERENTIAL/PLATELET
Abs Immature Granulocytes: 0.08 10*3/uL — ABNORMAL HIGH (ref 0.00–0.07)
Basophils Absolute: 0.1 10*3/uL (ref 0.0–0.1)
Basophils Relative: 0 %
Eosinophils Absolute: 0 10*3/uL (ref 0.0–0.5)
Eosinophils Relative: 0 %
HCT: 47 % — ABNORMAL HIGH (ref 36.0–46.0)
Hemoglobin: 15.8 g/dL — ABNORMAL HIGH (ref 12.0–15.0)
Immature Granulocytes: 1 %
Lymphocytes Relative: 8 %
Lymphs Abs: 1 10*3/uL (ref 0.7–4.0)
MCH: 32.2 pg (ref 26.0–34.0)
MCHC: 33.6 g/dL (ref 30.0–36.0)
MCV: 95.9 fL (ref 80.0–100.0)
Monocytes Absolute: 0.7 10*3/uL (ref 0.1–1.0)
Monocytes Relative: 5 %
Neutro Abs: 11.9 10*3/uL — ABNORMAL HIGH (ref 1.7–7.7)
Neutrophils Relative %: 86 %
Platelets: 377 10*3/uL (ref 150–400)
RBC: 4.9 MIL/uL (ref 3.87–5.11)
RDW: 12.7 % (ref 11.5–15.5)
WBC: 13.8 10*3/uL — ABNORMAL HIGH (ref 4.0–10.5)
nRBC: 0 % (ref 0.0–0.2)

## 2021-09-25 LAB — LIPASE, BLOOD: Lipase: 31 U/L (ref 11–51)
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
## 2021-09-25 NOTE — Patient Instructions (Addendum)
We will call with the results.  If you would like to follow up with Dr. Nicki Reaper tomorrow he said that was okay with him.  Restart Omeprazole.  Increase your fluid intake.  Take care  Dr. Lacinda Axon

## 2021-09-25 NOTE — Assessment & Plan Note (Signed)
Acute illness/fever. Etiology unclear at this time.  Labs done today and revealed mild leukocytosis and hemoconcentration.  Chest x-ray was obtained and was independent reviewed by me.  No acute infiltrate.  There were some bronchitic changes. Urinalysis was not consistent with UTI.  Awaiting COVID test result.  Tylenol as needed.  Supportive care.

## 2021-09-26 ENCOUNTER — Encounter: Payer: Self-pay | Admitting: Family Medicine

## 2021-09-26 ENCOUNTER — Telehealth (HOSPITAL_COMMUNITY): Payer: PPO | Admitting: Psychiatry

## 2021-09-26 LAB — NOVEL CORONAVIRUS, NAA: SARS-CoV-2, NAA: NOT DETECTED

## 2021-09-26 NOTE — Telephone Encounter (Signed)
Nurses Please relay the following message to [place] Also tomorrow morning if she calls back stating she is not feeling well please directly communicate with me and we can work her in tomorrow morning for recheck  Hi Io I was able to talk with Dr. Lacinda Axon I was also able to look over your results White blood count being elevated can be related to infection More than likely viral illness but if you are not improving by tomorrow morning I would recommend a recheck. The urine is mainly concentrated I do not see any signs of infection but a urine culture is pending I looked at the chest x-ray images do not see any pneumonia Slight increased markings on the right side consistent with reactive airway or bronchial involvement such as bronchitis but typically this is more of a viral finding Any rashes? Any tick bites? If you are not improving by Friday morning I highly recommend that we recheck you.  Please let [place] know early Friday morning-I am traveling Friday afternoon to see a sick relative-so if I am going to recheck you it needs to be tomorrow morning Feel free to give [place] an update tomorrow morning thank you Talk soon-Dr. Nicki Reaper

## 2021-09-26 NOTE — Telephone Encounter (Signed)
Nurses Certainly concerned about what is going on with Katelyn We do want to take good care of her  Red raised bumps unlikely to be tick related illness It would be advisable for her to be seen tomorrow morning Please work her into my schedule As a reminder I am out of the office tomorrow afternoon due to visiting a sick relative Thank you-Dr. Nicki Reaper If the patient feels he needs to be seen before then only other recourse would be urgent care or ER thank you

## 2021-09-27 ENCOUNTER — Ambulatory Visit: Payer: PPO | Admitting: Family Medicine

## 2021-09-27 ENCOUNTER — Encounter (HOSPITAL_COMMUNITY): Payer: Self-pay | Admitting: Emergency Medicine

## 2021-09-27 ENCOUNTER — Inpatient Hospital Stay (HOSPITAL_COMMUNITY)
Admission: EM | Admit: 2021-09-27 | Discharge: 2021-10-01 | DRG: 076 | Disposition: A | Discharge status: Home / Self Care | Payer: PPO | Attending: Internal Medicine | Admitting: Internal Medicine

## 2021-09-27 ENCOUNTER — Other Ambulatory Visit: Payer: Self-pay

## 2021-09-27 DIAGNOSIS — F419 Anxiety disorder, unspecified: Secondary | ICD-10-CM | POA: Diagnosis present

## 2021-09-27 DIAGNOSIS — A419 Sepsis, unspecified organism: Secondary | ICD-10-CM | POA: Diagnosis not present

## 2021-09-27 DIAGNOSIS — G629 Polyneuropathy, unspecified: Secondary | ICD-10-CM

## 2021-09-27 DIAGNOSIS — Z7951 Long term (current) use of inhaled steroids: Secondary | ICD-10-CM | POA: Diagnosis not present

## 2021-09-27 DIAGNOSIS — R1013 Epigastric pain: Secondary | ICD-10-CM

## 2021-09-27 DIAGNOSIS — Z20822 Contact with and (suspected) exposure to covid-19: Secondary | ICD-10-CM | POA: Diagnosis present

## 2021-09-27 DIAGNOSIS — K219 Gastro-esophageal reflux disease without esophagitis: Secondary | ICD-10-CM | POA: Diagnosis present

## 2021-09-27 DIAGNOSIS — G039 Meningitis, unspecified: Secondary | ICD-10-CM | POA: Diagnosis present

## 2021-09-27 DIAGNOSIS — R238 Other skin changes: Secondary | ICD-10-CM | POA: Diagnosis present

## 2021-09-27 DIAGNOSIS — Z85820 Personal history of malignant melanoma of skin: Secondary | ICD-10-CM | POA: Diagnosis not present

## 2021-09-27 DIAGNOSIS — Z6833 Body mass index (BMI) 33.0-33.9, adult: Secondary | ICD-10-CM

## 2021-09-27 DIAGNOSIS — G894 Chronic pain syndrome: Secondary | ICD-10-CM

## 2021-09-27 DIAGNOSIS — Z79899 Other long term (current) drug therapy: Secondary | ICD-10-CM

## 2021-09-27 DIAGNOSIS — G8929 Other chronic pain: Secondary | ICD-10-CM | POA: Diagnosis not present

## 2021-09-27 DIAGNOSIS — A879 Viral meningitis, unspecified: Secondary | ICD-10-CM | POA: Diagnosis present

## 2021-09-27 DIAGNOSIS — E876 Hypokalemia: Secondary | ICD-10-CM | POA: Diagnosis not present

## 2021-09-27 DIAGNOSIS — E669 Obesity, unspecified: Secondary | ICD-10-CM | POA: Diagnosis present

## 2021-09-27 DIAGNOSIS — Z880 Allergy status to penicillin: Secondary | ICD-10-CM

## 2021-09-27 DIAGNOSIS — J45909 Unspecified asthma, uncomplicated: Secondary | ICD-10-CM | POA: Diagnosis present

## 2021-09-27 DIAGNOSIS — G43909 Migraine, unspecified, not intractable, without status migrainosus: Secondary | ICD-10-CM | POA: Diagnosis present

## 2021-09-27 DIAGNOSIS — M533 Sacrococcygeal disorders, not elsewhere classified: Secondary | ICD-10-CM | POA: Diagnosis present

## 2021-09-27 DIAGNOSIS — G4730 Sleep apnea, unspecified: Secondary | ICD-10-CM | POA: Diagnosis present

## 2021-09-27 DIAGNOSIS — Z87891 Personal history of nicotine dependence: Secondary | ICD-10-CM | POA: Diagnosis not present

## 2021-09-27 DIAGNOSIS — M7918 Myalgia, other site: Secondary | ICD-10-CM | POA: Diagnosis not present

## 2021-09-27 DIAGNOSIS — Z9884 Bariatric surgery status: Secondary | ICD-10-CM | POA: Diagnosis not present

## 2021-09-27 DIAGNOSIS — Z881 Allergy status to other antibiotic agents status: Secondary | ICD-10-CM | POA: Diagnosis not present

## 2021-09-27 DIAGNOSIS — Z888 Allergy status to other drugs, medicaments and biological substances status: Secondary | ICD-10-CM | POA: Diagnosis not present

## 2021-09-27 DIAGNOSIS — R21 Rash and other nonspecific skin eruption: Secondary | ICD-10-CM

## 2021-09-27 DIAGNOSIS — R112 Nausea with vomiting, unspecified: Secondary | ICD-10-CM

## 2021-09-27 LAB — CSF CELL COUNT WITH DIFFERENTIAL
Eosinophils, CSF: 0 % (ref 0–1)
Eosinophils, CSF: 0 % (ref 0–1)
Lymphs, CSF: 12 % — ABNORMAL LOW (ref 40–80)
Lymphs, CSF: 15 % — ABNORMAL LOW (ref 40–80)
Monocyte-Macrophage-Spinal Fluid: 37 % (ref 15–45)
Monocyte-Macrophage-Spinal Fluid: 35 % (ref 15–45)
RBC Count, CSF: 9 /mm3 — ABNORMAL HIGH
RBC Count, CSF: 8 /mm3 — ABNORMAL HIGH
Segmented Neutrophils-CSF: 51 % — ABNORMAL HIGH (ref 0–6)
Segmented Neutrophils-CSF: 50 % — ABNORMAL HIGH (ref 0–6)
Tube #: 1
Tube #: 4
WBC, CSF: 51 /mm3 (ref 0–5)
WBC, CSF: 33 /mm3 (ref 0–5)

## 2021-09-27 LAB — CBC WITH DIFFERENTIAL/PLATELET
Abs Immature Granulocytes: 0.06 10*3/uL (ref 0.00–0.07)
Basophils Absolute: 0 10*3/uL (ref 0.0–0.1)
Basophils Relative: 0 %
Eosinophils Absolute: 0 10*3/uL (ref 0.0–0.5)
Eosinophils Relative: 0 %
HCT: 46.6 % — ABNORMAL HIGH (ref 36.0–46.0)
Hemoglobin: 15.4 g/dL — ABNORMAL HIGH (ref 12.0–15.0)
Immature Granulocytes: 1 %
Lymphocytes Relative: 7 %
Lymphs Abs: 0.9 10*3/uL (ref 0.7–4.0)
MCH: 32 pg (ref 26.0–34.0)
MCHC: 33 g/dL (ref 30.0–36.0)
MCV: 96.9 fL (ref 80.0–100.0)
Monocytes Absolute: 0.7 10*3/uL (ref 0.1–1.0)
Monocytes Relative: 6 %
Neutro Abs: 10 10*3/uL — ABNORMAL HIGH (ref 1.7–7.7)
Neutrophils Relative %: 86 %
Platelets: 273 10*3/uL (ref 150–400)
RBC: 4.81 MIL/uL (ref 3.87–5.11)
RDW: 12.7 % (ref 11.5–15.5)
WBC: 11.8 10*3/uL — ABNORMAL HIGH (ref 4.0–10.5)
nRBC: 0 % (ref 0.0–0.2)

## 2021-09-27 LAB — URINE CULTURE

## 2021-09-27 LAB — COMPREHENSIVE METABOLIC PANEL
ALT: 13 U/L (ref 0–44)
AST: 23 U/L (ref 15–41)
Albumin: 4 g/dL (ref 3.5–5.0)
Alkaline Phosphatase: 108 U/L (ref 38–126)
Anion gap: 11 (ref 5–15)
BUN: 13 mg/dL (ref 6–20)
CO2: 22 mmol/L (ref 22–32)
Calcium: 8.4 mg/dL — ABNORMAL LOW (ref 8.9–10.3)
Chloride: 105 mmol/L (ref 98–111)
Creatinine, Ser: 0.8 mg/dL (ref 0.44–1.00)
GFR, Estimated: >60 mL/min (ref >60)
Glucose, Bld: 103 mg/dL — ABNORMAL HIGH (ref 70–99)
Potassium: 4.6 mmol/L (ref 3.5–5.1)
Sodium: 138 mmol/L (ref 135–145)
Total Bilirubin: 1.2 mg/dL (ref 0.3–1.2)
Total Protein: 7.4 g/dL (ref 6.5–8.1)

## 2021-09-27 LAB — LIPASE, BLOOD: Lipase: 29 U/L (ref 11–51)

## 2021-09-27 LAB — URINALYSIS, ROUTINE W REFLEX MICROSCOPIC
Bilirubin Urine: NEGATIVE
Glucose, UA: NEGATIVE mg/dL
Hgb urine dipstick: NEGATIVE
Ketones, ur: 80 mg/dL — AB
Leukocytes,Ua: NEGATIVE
Nitrite: NEGATIVE
Protein, ur: 100 mg/dL — AB
Specific Gravity, Urine: 1.036 — ABNORMAL HIGH (ref 1.005–1.030)
pH: 5 (ref 5.0–8.0)

## 2021-09-27 LAB — RESP PANEL BY RT-PCR (FLU A&B, COVID) ARPGX2
Influenza A by PCR: NEGATIVE
Influenza B by PCR: NEGATIVE
SARS Coronavirus 2 by RT PCR: NEGATIVE

## 2021-09-27 LAB — PREGNANCY, URINE: Preg Test, Ur: NEGATIVE

## 2021-09-27 LAB — MAGNESIUM: Magnesium: 2 mg/dL (ref 1.7–2.4)

## 2021-09-27 LAB — PHOSPHORUS: Phosphorus: 3.1 mg/dL (ref 2.5–4.6)

## 2021-09-27 LAB — GLUCOSE, CSF: Glucose, CSF: 59 mg/dL (ref 40–70)

## 2021-09-27 LAB — PATHOLOGIST SMEAR REVIEW

## 2021-09-27 LAB — PROTEIN, CSF: Total  Protein, CSF: 64 mg/dL — ABNORMAL HIGH (ref 15–45)

## 2021-09-27 MED ORDER — TIZANIDINE HCL 2 MG PO TABS
4.0000 mg | ORAL_TABLET | Freq: Three times a day (TID) | ORAL | Status: DC | PRN
  Administered 2021-09-30 – 2021-10-01 (×2): 4 mg via ORAL
  Filled 2021-09-27 (×2): qty 2

## 2021-09-27 MED ORDER — MOMETASONE FURO-FORMOTEROL FUM 100-5 MCG/ACT IN AERO
2.0000 | INHALATION_SPRAY | Freq: Two times a day (BID) | RESPIRATORY_TRACT | Status: DC
  Administered 2021-09-27: 2 via RESPIRATORY_TRACT
  Filled 2021-09-27: qty 8.8

## 2021-09-27 MED ORDER — LACTATED RINGERS IV BOLUS
1000.0000 mL | Freq: Once | INTRAVENOUS | Status: AC
  Administered 2021-09-27: 1000 mL via INTRAVENOUS

## 2021-09-27 MED ORDER — OXYCODONE HCL 5 MG PO TABS
10.0000 mg | ORAL_TABLET | Freq: Four times a day (QID) | ORAL | Status: DC | PRN
  Administered 2021-09-27 – 2021-10-01 (×11): 10 mg via ORAL
  Filled 2021-09-27 (×13): qty 2

## 2021-09-27 MED ORDER — VANCOMYCIN HCL 1250 MG/250ML IV SOLN
1250.0000 mg | Freq: Two times a day (BID) | INTRAVENOUS | Status: DC
  Administered 2021-09-27 – 2021-09-29 (×5): 1250 mg via INTRAVENOUS
  Filled 2021-09-27 (×6): qty 250

## 2021-09-27 MED ORDER — ONDANSETRON HCL 4 MG/2ML IJ SOLN
4.0000 mg | Freq: Once | INTRAMUSCULAR | Status: AC
  Administered 2021-09-27: 4 mg via INTRAVENOUS
  Filled 2021-09-27: qty 2

## 2021-09-27 MED ORDER — ENOXAPARIN SODIUM 40 MG/0.4ML IJ SOSY
40.0000 mg | PREFILLED_SYRINGE | INTRAMUSCULAR | Status: DC
  Filled 2021-09-27 (×3): qty 0.4

## 2021-09-27 MED ORDER — VITAMIN D 25 MCG (1000 UNIT) PO TABS
1000.0000 [IU] | ORAL_TABLET | Freq: Every day | ORAL | Status: DC
  Administered 2021-09-29 – 2021-09-30 (×2): 1000 [IU] via ORAL
  Filled 2021-09-27 (×5): qty 1

## 2021-09-27 MED ORDER — FENTANYL CITRATE PF 50 MCG/ML IJ SOSY
100.0000 ug | PREFILLED_SYRINGE | Freq: Once | INTRAMUSCULAR | Status: AC
  Administered 2021-09-27: 100 ug via INTRAVENOUS
  Filled 2021-09-27: qty 2

## 2021-09-27 MED ORDER — ACETAMINOPHEN 325 MG PO TABS
650.0000 mg | ORAL_TABLET | Freq: Once | ORAL | Status: AC
  Administered 2021-09-27: 650 mg via ORAL
  Filled 2021-09-27: qty 2

## 2021-09-27 MED ORDER — HYDROMORPHONE HCL 1 MG/ML IJ SOLN
1.0000 mg | INTRAMUSCULAR | Status: DC | PRN
  Administered 2021-09-27 – 2021-09-30 (×13): 1 mg via INTRAVENOUS
  Filled 2021-09-27 (×13): qty 1

## 2021-09-27 MED ORDER — CALCIUM CARBONATE 1250 (500 CA) MG PO TABS
1.0000 | ORAL_TABLET | Freq: Every day | ORAL | Status: DC
  Filled 2021-09-27: qty 1

## 2021-09-27 MED ORDER — SODIUM CHLORIDE 0.9 % IV SOLN: INTRAVENOUS | Status: DC

## 2021-09-27 MED ORDER — METOCLOPRAMIDE HCL 5 MG/ML IJ SOLN
10.0000 mg | Freq: Once | INTRAMUSCULAR | Status: AC
  Administered 2021-09-27: 10 mg via INTRAVENOUS
  Filled 2021-09-27: qty 2

## 2021-09-27 MED ORDER — SODIUM CHLORIDE 0.9 % IV SOLN
2.0000 g | Freq: Once | INTRAVENOUS | Status: AC
  Administered 2021-09-27: 2 g via INTRAVENOUS
  Filled 2021-09-27: qty 20

## 2021-09-27 MED ORDER — CALCIUM CITRATE-VITAMIN D 500-12.5 MG-MCG PO CHEW
1.0000 | CHEWABLE_TABLET | Freq: Every day | ORAL | Status: DC
Start: 2021-09-27 — End: 2021-09-27

## 2021-09-27 MED ORDER — ALBUTEROL SULFATE (2.5 MG/3ML) 0.083% IN NEBU: 3.0000 mL | INHALATION_SOLUTION | Freq: Four times a day (QID) | RESPIRATORY_TRACT | Status: DC | PRN

## 2021-09-27 MED ORDER — ACETAMINOPHEN 325 MG PO TABS
650.0000 mg | ORAL_TABLET | Freq: Four times a day (QID) | ORAL | Status: DC | PRN
  Administered 2021-09-27 – 2021-09-28 (×3): 650 mg via ORAL
  Filled 2021-09-27 (×3): qty 2

## 2021-09-27 MED ORDER — ONDANSETRON HCL 4 MG/2ML IJ SOLN
4.0000 mg | Freq: Four times a day (QID) | INTRAMUSCULAR | Status: DC | PRN
  Administered 2021-09-27 – 2021-10-01 (×7): 4 mg via INTRAVENOUS
  Filled 2021-09-27 (×7): qty 2

## 2021-09-27 MED ORDER — ACYCLOVIR SODIUM 50 MG/ML IV SOLN
10.0000 mg/kg | Freq: Three times a day (TID) | INTRAVENOUS | Status: DC
Start: 2021-09-27 — End: 2021-10-01
  Administered 2021-09-27 – 2021-10-01 (×12): 720 mg via INTRAVENOUS
  Filled 2021-09-27 (×17): qty 14.4

## 2021-09-27 MED ORDER — CALCIUM CARBONATE 1250 (500 CA) MG PO TABS
1.0000 | ORAL_TABLET | Freq: Every day | ORAL | Status: DC
  Administered 2021-09-27 – 2021-10-01 (×4): 500 mg via ORAL
  Filled 2021-09-27 (×5): qty 1

## 2021-09-27 MED ORDER — PANTOPRAZOLE SODIUM 40 MG IV SOLR
40.0000 mg | INTRAVENOUS | Status: DC
Start: 2021-09-27 — End: 2021-09-28
  Administered 2021-09-27 – 2021-09-28 (×2): 40 mg via INTRAVENOUS
  Filled 2021-09-27 (×2): qty 10

## 2021-09-27 MED ORDER — SODIUM CHLORIDE 0.9 % IV SOLN
100.0000 mg | Freq: Two times a day (BID) | INTRAVENOUS | Status: DC
  Administered 2021-09-27: 100 mg via INTRAVENOUS
  Filled 2021-09-27 (×4): qty 100

## 2021-09-27 MED ORDER — VANCOMYCIN HCL 1500 MG/300ML IV SOLN
1500.0000 mg | Freq: Once | INTRAVENOUS | Status: AC
  Administered 2021-09-27: 1500 mg via INTRAVENOUS
  Filled 2021-09-27: qty 300

## 2021-09-27 MED ORDER — HYDROMORPHONE HCL 1 MG/ML IJ SOLN
0.5000 mg | Freq: Once | INTRAMUSCULAR | Status: AC
  Administered 2021-09-27: 0.5 mg via INTRAVENOUS
  Filled 2021-09-27: qty 0.5

## 2021-09-27 MED ORDER — GABAPENTIN 300 MG PO CAPS
600.0000 mg | ORAL_CAPSULE | Freq: Three times a day (TID) | ORAL | Status: DC
  Administered 2021-09-27 – 2021-10-01 (×12): 600 mg via ORAL
  Filled 2021-09-27 (×13): qty 2

## 2021-09-27 MED ORDER — CEFTRIAXONE SODIUM 2 G IJ SOLR
2.0000 g | Freq: Two times a day (BID) | INTRAMUSCULAR | Status: DC
Start: 2021-09-28 — End: 2021-09-30
  Administered 2021-09-29 – 2021-09-30 (×4): 2 g via INTRAVENOUS
  Filled 2021-09-27 (×4): qty 20

## 2021-09-27 MED ORDER — DIPHENHYDRAMINE HCL 50 MG/ML IJ SOLN
25.0000 mg | Freq: Once | INTRAMUSCULAR | Status: AC
  Administered 2021-09-27: 25 mg via INTRAVENOUS
  Filled 2021-09-27: qty 1

## 2021-09-27 MED ORDER — HYDROMORPHONE HCL 1 MG/ML IJ SOLN
0.5000 mg | INTRAMUSCULAR | Status: DC | PRN
  Administered 2021-09-27 (×2): 0.5 mg via INTRAVENOUS
  Filled 2021-09-27 (×2): qty 0.5

## 2021-09-27 NOTE — Care Management Important Message (Signed)
Important Message  Patient Details  Name: Katelyn [name] MRN: [id number] Date of Birth: 1975-07-10   Medicare Important Message Given:  N/A - LOS <3 / Initial given by admissions     Tommy Medal 09/27/2021, 1:48 PM

## 2021-09-27 NOTE — Plan of Care (Signed)
  Problem: Safety: Goal: Ability to remain free from injury will improve Outcome: Progressing   Problem: Skin Integrity: Goal: Risk for impaired skin integrity will decrease Outcome: Progressing   Problem: Nutrition: Goal: Adequate nutrition will be maintained Outcome: Not Progressing   Problem: Pain Managment: Goal: General experience of comfort will improve Outcome: Not Progressing

## 2021-09-27 NOTE — Consult Note (Addendum)
[name]    Date of Admission:  09/27/2021     Reason for Consult: sepsis, headache, csf pleocytosis    Referring Provider: Denton Brick      Abx: 6/2-c acyclovir iv 6/2-c vanc 6/2-c ceftriaxone        Assessment: Rash Sepsis Headache Csf pleocytosis   46 yo female asthma, gerd, small fiber neuropathy, anxiety, chronic myofascial pain syndrome, admitted 6/2 with myalgia/headache/fever of a 3 days, found to have an erythematous papular rash on trunk and a right periorbital erythematous macular rash, found to have csf pleocytosis and stain showing gpc in pairs   6/02 csf -- 59 glucose; 64 protein; colorless; 33 wbc (50% seg, 15% lymph, no eos, 15% mono); gram stain showing GPC in pairs 6/02 called micro and they have 0.5 cc csf left they will send for csf meningoencephalitis pcr  She denies confusion and has no hx of encephalopathy  Ambiguous history of tick bite but normal lft and no thrombocytopenia  Pending serology: 6/02 csf ME pcr 6/02 csf vdrl, arborvirus 6/02 RMS[facility] serology 6/02 hiv screen    This is a rather confusing picture. Her story including the low csf wbc/normal glucose screams viral process or a non-rmsf/erhlichiosis/lyme presentation, but her csf gram stain is rather unexpected. She appeared to have had no prior antibiotics to alter the csf (or even if abx taken wouldn't expect it to look rather bland like this). I have low suspicion for hsv encephalitis  For now I would wait on the final culture and csf pcr. But for now could continue empiric antimicrobials     Plan: Continue vanc, ceftriaxone Add acyclovir Await csf culture and csf ME pcr Will add serum rpr for the rash as well   Discussed with pharmacy/primary team/nursing staff  I spent 75 minute reviewing data/chart, and coordinating care and >50% direct face to face time providing counseling/discussing diagnostics/treatment plan with patient/primary care  team    ------------------------------------------------ Principal Problem:   Meningitis Active Problems:   Nausea & vomiting   Coccydynia   Chronic myofascial pain   Small fiber neuropathy   Epigastric pain   GERD (gastroesophageal reflux disease)   Rash and nonspecific skin eruption   Hypocalcemia   Chronic pain syndrome    HPI: Katelyn [name] is a 46 y.o. female asthma, gerd, small fiber neuropathy, anxiety, chronic myofascial pain syndrome, admitted 6/2 with myalgia/headache/fever of a 3 days, found to have an erythematous papular rash on trunk and a right periorbital erythematous macular rash, found to have csf pleocytosis and stain showing gpc in pairs  Patient reports about 3 days more heart burn, followed quickly by fever up to 103, neck pain, headache, body ache and n/v  She denies diarrhea Tested negative for covid with pcp; had cxr that was negative for pna On the day of admission reported a red rash on extremities  No chest pain/dyspnea, cough Question of tickbite but denies  In the ed: Febrile 102.3 Wbc 12-13 Cxr clear No thrombocytopenia No hepatitis No aki No head imaging Blood cx not obtained Lp done 30s wbc 50% neutrophil; stain gpc in pairs Ucx normal flora  Started on bs abx including acyclovir Tick serology sent  Continue without confusion There was some csf left so I was able to order csf ME pcr    Family History  Problem Relation Age of Onset   Hypercholesterolemia Mother    Hypertension Mother  Iterstitial Cystist   Hyperlipidemia Mother    Cancer Mother 28       breast    Depression Brother    Alcohol abuse Brother    Colon polyps Father    Depression Father    Irritable bowel syndrome Father    Alcohol abuse Father    Colon cancer Paternal Aunt 79   Heart attack Paternal Grandfather    Kidney cancer Paternal Grandfather    Cancer Maternal Grandfather        unknown type   Esophageal cancer Neg Hx     Stomach cancer Neg Hx     Social History   Tobacco Use   Smoking status: Former    Packs/day: 1.00    Years: 5.00    Pack years: 5.00    Types: Cigarettes    Start date: 14    Quit date: 08/26/1997    Years since quitting: 24.1   Smokeless tobacco: Never  Vaping Use   Vaping Use: Never used  Substance Use Topics   Alcohol use: No    Alcohol/week: 0.0 standard drinks   Drug use: No    Allergies  Allergen Reactions   Ciprofloxacin Other (See Comments)    Nerve pain     Lyrica [Pregabalin] Anaphylaxis   Duloxetine Hcl     Make her more depressed, having nausea, vomiting, headache and threw up.    Feraheme [Ferumoxytol] Hives   Injectafer [Ferric Carboxymaltose] Other (See Comments)    Fevers   Cyanocobalamin     Injectable only - numbness, tingling, muscle cramping, and heart palpations    Penicillins     Has patient had a PCN reaction causing immediate rash, facial/tongue/throat swelling, SOB or lightheadedness with hypotension: Yes Has patient had a PCN reaction causing severe rash involving mucus membranes or skin necrosis: No Has patient had a PCN reaction that required hospitalization No Has patient had a PCN reaction occurring within the last 10 years: No If all of the above answers are "NO", then may proceed with Cephalosporin use.     REACTION: Rash    Review of Systems: ROS All Other ROS was negative, except mentioned above   Past Medical History:  Diagnosis Date   Anemia    Anorexia    Anxiety    Asthma    controlled with meds   Benign juvenile melanoma    Chronic headaches    Colon polyps    found on colonoscopy 96/22/2979   Complication of anesthesia    itching after epidural for c section   Constipation    Depression    Depression    several suicide attempts, hospitaluzed in 2012 for this; hx pf ECT treatments ; pt sees Dr. Adele Schilder pyschiatrist  and doing well on medication   Dysrhythmia    hx of PVC's (pt hasn't seen cardiologist  since 2021- no follow up needed)   GERD (gastroesophageal reflux disease)    Headache    Heart murmur    as a child - no one mentions hearing murmur anymore, sometimes it is heard. Echo has been performed 02/03/18   Heartburn    no meds   History of pneumonia    x 3  years ago - no recent problems   Hx of blood clots    Neuromuscular disorder (HCC)    small fiber neuropathy   Obesity    Pneumonia    Polycystic ovary    takes metformin to treat   Renal atrophy, left 2023  Skin lesion    Excisional biopsy of moles - none cancerous   Sleep apnea    yrs ago - diagnosed mild sleep apnea - did not have to use cpap        Scheduled Meds:  calcium carbonate  1 tablet Oral Q breakfast   cholecalciferol  1,000 Units Oral Daily   enoxaparin (LOVENOX) injection  40 mg Subcutaneous Q24H   mometasone-formoterol  2 puff Inhalation BID   pantoprazole (PROTONIX) IV  40 mg Intravenous Q24H   Continuous Infusions:  [START ON 09/28/2021] cefTRIAXone (ROCEPHIN)  IV     doxycycline (VIBRAMYCIN) IV 100 mg (09/27/21 0554)   vancomycin     vancomycin 1,500 mg (09/27/21 1005)   PRN Meds:.acetaminophen, albuterol, HYDROmorphone (DILAUDID) injection, ondansetron (ZOFRAN) IV, tiZANidine   OBJECTIVE: Blood pressure (!) 117/58, pulse 94, temperature (!) 102.1 F (38.9 C), resp. rate 18, height 5' 5.5" (1.664 m), weight 92.1 kg, last menstrual period 09/09/2021, SpO2 100 %.  Physical Exam Erythematous upper ext papular rash noted, facial involvement as well Neuro exam in tact no meningismus noted  Lab Results Lab Results  Component Value Date   WBC 11.8 (H) 09/27/2021   HGB 15.4 (H) 09/27/2021   HCT 46.6 (H) 09/27/2021   MCV 96.9 09/27/2021   PLT 273 09/27/2021    Lab Results  Component Value Date   CREATININE 0.80 09/27/2021   BUN 13 09/27/2021   NA 138 09/27/2021   K 4.6 09/27/2021   CL 105 09/27/2021   CO2 22 09/27/2021    Lab Results  Component Value Date   ALT 13 09/27/2021    AST 23 09/27/2021   ALKPHOS 108 09/27/2021   BILITOT 1.2 09/27/2021      Microbiology: Recent Results (from the past 240 hour(s))  Novel Coronavirus, NAA ([facility])     Status: None   Collection Time: 09/25/21 12:00 AM   Specimen: Nasopharyngeal(NP) swabs in vial transport medium   Nasopharynge  Previous  Result Value Ref Range Status   SARS-CoV-2, NAA Not Detected Not Detected Final    Comment: This nucleic acid amplification test was developed and its performance characteristics determined by [employer]. Nucleic acid amplification tests include RT-PCR and TMA. This test has not been FDA cleared or approved. This test has been authorized by FDA under an Emergency Use Authorization (EUA). This test is only authorized for the duration of time the declaration that circumstances exist justifying the authorization of the emergency use of in vitro diagnostic tests for detection of SARS-CoV-2 virus and/or diagnosis of COVID-19 infection under section 564(b)(1) of the Act, 21 U.S.C. 850YDX-4(J) (1), unless the authorization is terminated or revoked sooner. When diagnostic testing is negative, the possibility of a false negative result should be considered in the context of a patient's recent exposures and the presence of clinical signs and symptoms consistent with COVID-19. An individual without symptoms of COVID-19 and who is not shedding SARS-CoV-2 virus wo uld expect to have a negative (not detected) result in this assay.   Urine Culture     Status: None   Collection Time: 09/25/21 12:00 AM   Specimen: Urine   Urine  Result Value Ref Range Status   Urine Culture, Routine Final report  Final   Organism ID, Bacteria Comment  Final    Comment: Mixed urogenital flora 10,000-25,000 colony forming units per mL   Resp Panel by RT-PCR (Flu A&B, Covid) Urine, Clean Catch     Status: None   Collection Time: 09/27/21  2:33 AM   Specimen: Urine, Clean Catch; Nasal Swab   Result Value Ref Range Status   SARS Coronavirus 2 by RT PCR NEGATIVE NEGATIVE Final    Comment: (NOTE) SARS-CoV-2 target nucleic acids are NOT DETECTED.  The SARS-CoV-2 RNA is generally detectable in upper respiratory specimens during the acute phase of infection. The lowest concentration of SARS-CoV-2 viral copies this assay can detect is 138 copies/mL. A negative result does not preclude SARS-Cov-2 infection and should not be used as the sole basis for treatment or other patient management decisions. A negative result may occur with  improper specimen collection/handling, submission of specimen other than nasopharyngeal swab, presence of viral mutation(s) within the areas targeted by this assay, and inadequate number of viral copies(<138 copies/mL). A negative result must be combined with clinical observations, patient history, and epidemiological information. The expected result is Negative.  Fact Sheet for Patients:  EntrepreneurPulse.com.au  Fact Sheet for Healthcare Providers:  IncredibleEmployment.be  This test is no t yet approved or cleared by the [place] FDA and  has been authorized for detection and/or diagnosis of SARS-CoV-2 by FDA under an Emergency Use Authorization (EUA). This EUA will remain  in effect (meaning this test can be used) for the duration of the COVID-19 declaration under Section 564(b)(1) of the Act, 21 U.S.C.section 360bbb-3(b)(1), unless the authorization is terminated  or revoked sooner.       Influenza A by PCR NEGATIVE NEGATIVE Final   Influenza B by PCR NEGATIVE NEGATIVE Final    Comment: (NOTE) The Xpert Xpress SARS-CoV-2/FLU/RSV plus assay is intended as an aid in the diagnosis of influenza from Nasopharyngeal swab specimens and should not be used as a sole basis for treatment. Nasal washings and aspirates are unacceptable for Xpert Xpress SARS-CoV-2/FLU/RSV testing.  Fact Sheet for  Patients: EntrepreneurPulse.com.au  Fact Sheet for Healthcare Providers: IncredibleEmployment.be  This test is not yet approved or cleared by the [place] FDA and has been authorized for detection and/or diagnosis of SARS-CoV-2 by FDA under an Emergency Use Authorization (EUA). This EUA will remain in effect (meaning this test can be used) for the duration of the COVID-19 declaration under Section 564(b)(1) of the Act, 21 U.S.C. section 360bbb-3(b)(1), unless the authorization is terminated or revoked.  Performed at [facility], [address] [place], [place] [address]   CSF culture     Status: None (Preliminary result)   Collection Time: 09/27/21  3:55 AM   Specimen: CSF; Cerebrospinal Fluid  Result Value Ref Range Status   Specimen Description CSF  Final   Special Requests NONE  Final   Gram Stain   Final    CYTOSPIN SMEAR NO ORGANISMS SEEN WBC PRESENT,BOTH PMN AND MONONUCLEAR Performed at [facility], [address], [place], [place] [address]    Culture PENDING  Incomplete   Report Status PENDING  Incomplete     Serology:    Imaging: If present, new imagings (plain films, ct scans, and mri) have been personally visualized and interpreted; radiology reports have been reviewed. Decision making incorporated into the Impression / Recommendations.  5/31 cxr 1. No focal airspace consolidation. 2. Mild bronchial wall thickening, as can be seen with bronchitis/reactive airway disease.     I verified that I was speaking with the correct person using two identifiers. Due to distance inpatient ID coverage lacking, this service was provided via telemedicine using audio/visual media.   The patient was located at [facility] The provider was located in the hospital [facility]  long  This is a telephone visit after reviewing documentation and discussing with her care team our assessment and plan    Jabier Mutton, [place] for [facility] [phone] pager    09/27/2021, 10:55 AM

## 2021-09-27 NOTE — TOC Progression Note (Signed)
  Transition of Care [facility]) Screening Note   Patient Details  Name: Katelyn [name] Date of Birth: Aug 27, 1975   [facility]) CM/SW Contact:    Shade Flood, LCSW Phone Number: 09/27/2021, 10:32 AM    Transition of Care Department [facility] has reviewed patient and no TOC needs have been identified at this time. We will continue to monitor patient advancement through interdisciplinary progression rounds. If new patient transition needs arise, please place a TOC consult.

## 2021-09-27 NOTE — Progress Notes (Signed)
Initial Nutrition Assessment  DOCUMENTATION CODES:      INTERVENTION:  When patient nausea improves:  -Trial Boost Breeze TID   NUTRITION DIAGNOSIS:   Inadequate oral intake related to acute illness (presumed meningitis) as evidenced by per patient/family report (tolerating only ice chips).   GOAL:  Patient will meet greater than or equal to 90% of their needs   MONITOR:  PO intake, Supplement acceptance, Labs, Weight trends  REASON FOR ASSESSMENT:   Malnutrition Screening Tool    ASSESSMENT:  Patient is a 46 yo female who presented to ED with emesis, fever, neck pain per MD. Presumed meningitis.   History of iron and B-12 deficiency, GERD, anxiety, headaches and depression. 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 Followed by Hematology and [place].  Patient diet fully advanced. No family at bedside. Patient relays that she is only able to tolerate ice chips due to her nausea. Unable to provide diet history at this time. Chart review. Onset of symptoms night of 5/30. On 5/23 patient endorsed 100% appetite during Hematology visit.  Weight Encounters: Stable since January at 92 kg.   Medications reviewed and include:      Latest Ref Rng & Units 09/27/2021    1:32 AM 09/25/2021    5:08 PM 09/10/2021    8:02 AM  BMP  Glucose 70 - 99 mg/dL 103   109   102    BUN 6 - 20 mg/dL '13   12   12    '$ Creatinine 0.44 - 1.00 mg/dL 0.80   0.73   0.63    Sodium 135 - 145 mmol/L 138   138   142    Potassium 3.5 - 5.1 mmol/L 4.6   3.7   3.9    Chloride 98 - 111 mmol/L 105   106   107    CO2 22 - 32 mmol/L '22   26   28    '$ Calcium 8.9 - 10.3 mg/dL 8.4   9.2   9.0        NUTRITION - FOCUSED PHYSICAL EXAM: Deferred to follow up.    Diet Order:   Diet Order             Diet Heart Room service appropriate? Yes; Fluid consistency: Thin  Diet effective now                   EDUCATION NEEDS:  Not appropriate for education at this time  Skin:  Skin  Assessment: Reviewed RN Assessment  Last BM:  5/31  Height:   Ht Readings from Last 1 Encounters:  09/27/21 5' 5.5" (1.664 m)    Weight:   Wt Readings from Last 1 Encounters:  09/27/21 92.1 kg    Ideal Body Weight:   59 kg  BMI:  Body mass index is 33.27 kg/m.  Estimated Nutritional Needs:   Kcal:  1800-1900  Protein:  83-90 gr  Fluid:  1.8-1.9 liters daily   Colman Cater MS,RD,CSG,LDN Contact: Shea Evans

## 2021-09-27 NOTE — Progress Notes (Signed)
PHARMACY - PHYSICIAN COMMUNICATION CRITICAL VALUE ALERT - BLOOD CULTURE IDENTIFICATION (BCID)  Katelyn [name] is an 46 y.o. female who presented to [facility] on 09/27/2021   Assessment:  CSF fluid with gram + cocci in pairs   Name of physician (or Provider) Contacted: Dr. Denton Brick  Current antibiotics: Vanco/CTX  Changes to prescribed antibiotics recommended:  ID consulted- currently assessing patient  No results found. However, due to the size of the patient record, not all encounters were searched. Please check Results Review for a complete set of results.  Katelyn [name] 09/27/2021  3:15 PM

## 2021-09-27 NOTE — H&P (Addendum)
History and Physical    Patient: Katelyn [name] IOX:[id number] DOB: 1976-01-05 DOA: 09/27/2021 DOS: the patient was seen and examined on 09/27/2021 PCP: Kathyrn Drown, MD  Patient coming from: Home  Chief Complaint:  Chief Complaint  Patient presents with   Emesis   HPI: Katelyn [name] is a 46 y.o. female with medical history significant of asthma, GERD, small fiber neuropathy, anxiety, coccydynia, chronic myofascial pain, chronic pain disorder who presents to the emergency department accompanied by husband due to several complaints.  Patient complained of heartburn about 3 days ago, on the following day, she developed fever with temperature up to 103F, neck pain, headache, body aches, multiple episodes of nonbloody vomiting.  Patient also complaining of epigastric pain, she has been to PCP, COVID test done was negative, chest x-ray done showed no focal airspace consolidation, but mild bronchial wall thickening as can be seen with bronchitis/reactive airway disease.  She also complained of onset of rash in upper extremities.  She denies chest pain, shortness of breath, new arm or leg weakness, tick bite, incontinence.  ED Course:  In the emergency department, patient was febrile with a temperature of 102.74F, she was intermittently tachypneic.  BP was 133/105, O2 sat was 96%-99% on room air.  Work-up in the ED showed mild leukocytosis, H/H15.4/46.6, BMP was normal except for mild elevation of blood glucose at 103, calcium 8.4, pregnancy test was negative.  Spinal tap was done and CSF showed elevated WBC and segmented neutrophils, CSF protein was elevated at 64.  Urinalysis was unimpressive for UTI.  Lipase 29.  Influenza A, B, SARS coronavirus 2 was negative. Patient was treated with Tylenol, IV ceftriaxone due to presumed meningitis was started, IV doxycycline due to skin rash presumed to be related to tick bite.  IV hydration was provided and pain medication with Dilaudid and  fentanyl were given.  Zofran due to nausea and vomiting was provided.  Hospitalist was asked to admit patient for further evaluation and management.  Review of Systems: Review of systems as noted in the HPI. All other systems reviewed and are negative.   Past Medical History:  Diagnosis Date   Anemia    Anorexia    Anxiety    Asthma    controlled with meds   Benign juvenile melanoma    Chronic headaches    Colon polyps    found on colonoscopy 26/83/4196   Complication of anesthesia    itching after epidural for c section   Constipation    Depression    Depression    several suicide attempts, hospitaluzed in 2012 for this; hx pf ECT treatments ; pt sees Dr. Adele Schilder pyschiatrist  and doing well on medication   Dysrhythmia    hx of PVC's (pt hasn't seen cardiologist since 2021- no follow up needed)   GERD (gastroesophageal reflux disease)    Headache    Heart murmur    as a child - no one mentions hearing murmur anymore, sometimes it is heard. Echo has been performed 02/03/18   Heartburn    no meds   History of pneumonia    x 3  years ago - no recent problems   Hx of blood clots    Neuromuscular disorder (HCC)    small fiber neuropathy   Obesity    Pneumonia    Polycystic ovary    takes metformin to treat   Renal atrophy, left 2023   Skin lesion    Excisional biopsy of moles -  none cancerous   Sleep apnea    yrs ago - diagnosed mild sleep apnea - did not have to use cpap    Past Surgical History:  Procedure Laterality Date   BREAST CAPSULECTOMY Bilateral 12/13/2020   1 week later pt had hematoma surgery on left breast   BREAST ENHANCEMENT SURGERY Bilateral 02/11/2017   BREATH TEK H PYLORI N/A 07/22/2013   Procedure: BREATH TEK H PYLORI;  Surgeon: Edward Jolly, MD;  Location: [facility];  Service: General;  Laterality: N/A;   CESAREAN SECTION  2004, 2007   x 2   COLONOSCOPY  2013   CYSTOSCOPY WITH URETHRAL DILATATION  age 55    GASTRIC ROUX-EN-Y N/A  10/31/2013   Procedure: LAPAROSCOPIC ROUX-EN-Y GASTRIC BYPASS WITH UPPER ENDOSCOPY;  Surgeon: Edward Jolly, MD;  Location: [facility];  Service: General;  Laterality: N/A;   GLUTEUS MINIMUS REPAIR Left 05/28/2021   Procedure: Left [place];  Surgeon: Vanetta Mulders, MD;  Location: [facility];  Service: Orthopedics;  Laterality: Left;   ingrown toenail Right    Ingrown nail on great right toe   kidney stent  08/28/2014   mole excision     "benign juvenile melanoma" removed from left leg - inner thigh   POLYPECTOMY     2013   SKIN LESION EXCISION     back   TONSILLECTOMY  age 89   TUBAL LIGATION  2018    Social History:  reports that she quit smoking about 24 years ago. Her smoking use included cigarettes. She started smoking about 29 years ago. She has a 5.00 pack-year smoking history. She has never used smokeless tobacco. She reports that she does not drink alcohol and does not use drugs.   Allergies  Allergen Reactions   Ciprofloxacin Other (See Comments)    Nerve pain     Lyrica [Pregabalin] Anaphylaxis   Duloxetine Hcl     Make her more depressed, having nausea, vomiting, headache and threw up.    Feraheme [Ferumoxytol] Hives   Injectafer [Ferric Carboxymaltose] Other (See Comments)    Fevers   Cyanocobalamin     Injectable only - numbness, tingling, muscle cramping, and heart palpations    Penicillins     Has patient had a PCN reaction causing immediate rash, facial/tongue/throat swelling, SOB or lightheadedness with hypotension: Yes Has patient had a PCN reaction causing severe rash involving mucus membranes or skin necrosis: No Has patient had a PCN reaction that required hospitalization No Has patient had a PCN reaction occurring within the last 10 years: No If all of the above answers are "NO", then may proceed with Cephalosporin use.     REACTION: Rash    Family History  Problem Relation Age of Onset   Hypercholesterolemia Mother    Hypertension  Mother        Iterstitial Cystist   Hyperlipidemia Mother    Cancer Mother 60       breast    Depression Brother    Alcohol abuse Brother    Colon polyps Father    Depression Father    Irritable bowel syndrome Father    Alcohol abuse Father    Colon cancer Paternal Aunt 24   Heart attack Paternal Grandfather    Kidney cancer Paternal Grandfather    Cancer Maternal Grandfather        unknown type   Esophageal cancer Neg Hx    Stomach cancer Neg Hx      Prior to Admission medications  Medication Sig Start Date End Date Taking? Authorizing Provider  albuterol (VENTOLIN HFA) 108 (90 Base) MCG/ACT inhaler Inhale 2 puffs into the lungs every 6 (six) hours as needed for wheezing or shortness of breath. 03/01/21   Margaretha Seeds, MD  budesonide-formoterol [facility]) 80-4.5 MCG/ACT inhaler Inhale 2 puffs into the lungs 2 (two) times daily. Patient taking differently: Inhale 2 puffs into the lungs 2 (two) times daily as needed (asthma). 07/25/19   Margaretha Seeds, MD  buPROPion [facility]) 75 MG tablet Take two tablets (75 mg total dose) by mouth daily. 06/27/21   Arfeen, Arlyce Harman, MD  Calcium Citrate-Vitamin D 500-12.5 MG-MCG CHEW Chew 1 each by mouth daily.    [provider]  Cholecalciferol (VITAMIN D) 50 MCG (2000 UT) CAPS Take 2,000 Units by mouth daily.    [provider]  clonazePAM (KLONOPIN) 0.5 MG tablet Take one tab 2-3 times a day for anxiety 06/27/21   Arfeen, Arlyce Harman, MD  docusate sodium (COLACE) 100 MG capsule Take 100-200 mg by mouth daily as needed for moderate constipation.    [provider]  fluconazole (DIFLUCAN) 150 MG tablet Take one on day 1,4, and 7 08/08/21   Luking, Elayne Snare, MD  gabapentin (NEURONTIN) 300 MG capsule Take 600 mg by mouth 3 (three) times daily. 01/15/21   Dorene Ar, MD  Lactobacillus Rhamnosus, GG, (CULTURELLE PO) Take 1 capsule by mouth daily.    [provider]  lidocaine (LIDODERM) 5 % Place 1 patch onto the  skin daily. Remove & Discard patch within 12 hours or as directed by MD 06/07/21   Vanetta Mulders, MD  Lidocaine 4 % PTCH Apply 1 patch topically daily as needed (pain).    [provider]  magnesium oxide (MAG-OX) 400 MG tablet Take 400 mg by mouth daily.    [provider]  Multiple Vitamins-Minerals (BARIATRIC FUSION) CHEW Chew 1 each by mouth daily.    [provider]  naloxone [facility]) nasal spray 4 mg/0.1 mL Place into the nose as needed.  03/18/18   [provider]  omeprazole (PRILOSEC) 20 MG capsule Take 20 mg by mouth daily as needed (acid reflux). 10/08/20   [provider]  ondansetron (ZOFRAN) 8 MG tablet Take 1 tablet (8 mg total) by mouth every 8 (eight) hours as needed for nausea. 04/09/21   Kathyrn Drown, MD  oxyCODONE-acetaminophen (PERCOCET) 10-325 MG tablet Take by mouth. 05/13/21   [provider]  sucralfate (CARAFATE) 1 g tablet Take 2 g by mouth 2 (two) times daily as needed (stomach ulcer). 09/06/20   [provider]  temazepam (RESTORIL) 30 MG capsule Take 1 capsule (30 mg total) by mouth at bedtime. 06/27/21   Arfeen, Arlyce Harman, MD  tiZANidine (ZANAFLEX) 4 MG tablet Take 4 mg by mouth every 8 (eight) hours as needed for muscle spasms.    [provider]  vitamin C (ASCORBIC ACID) 500 MG tablet Take 500 mg by mouth daily.    [provider]  zinc gluconate 50 MG tablet Take 50 mg by mouth daily.    [provider]  ziprasidone (GEODON) 80 MG capsule Take 1 capsule (80 mg total) by mouth at bedtime. 06/27/21   Kathlee Nations, MD    Physical Exam: BP (!) 117/58 (BP Location: Right Arm)   Pulse 94   Temp 99.7 F (37.6 C) (Oral)   Resp 18   Ht 5' 5.5" (1.664 m)   Wt 92.1 kg  LMP 09/09/2021   SpO2 100%   BMI 33.27 kg/m   General: 46 y.o. year-old obese female ill appearing, but in no acute distress.  Alert and oriented x3. HEENT: NCAT, EOMI Neck: Supple, trachea  medial Cardiovascular: Regular rate and rhythm with no rubs or gallops.  No thyromegaly or JVD noted.  No lower extremity edema. 2/4 pulses in all 4 extremities. Respiratory: Clear to auscultation with no wheezes or rales. Good inspiratory effort. Abdomen: Soft, tender to palpation of epigastric area.  Nondistended with normal bowel sounds x4 quadrants. Muskuloskeletal: No cyanosis, clubbing or edema noted bilaterally Neuro: CN II-XII intact, strength 5/5 x 4, sensation, reflexes intact Skin: Few scattered papular rash noted on upper extremities.  No ulcerative lesions noted  Psychiatry: Judgement and insight appear normal. Mood is appropriate for condition and setting          Labs on Admission:  Basic Metabolic Panel: Recent Labs  Lab 09/25/21 1708 09/27/21 0132  NA 138 138  K 3.7 4.6  CL 106 105  CO2 26 22  GLUCOSE 109* 103*  BUN 12 13  CREATININE 0.73 0.80  CALCIUM 9.2 8.4*   Liver Function Tests: Recent Labs  Lab 09/25/21 1708 09/27/21 0132  AST 18 23  ALT 19 13  ALKPHOS 126 108  BILITOT 0.4 1.2  PROT 8.1 7.4  ALBUMIN 4.6 4.0   Recent Labs  Lab 09/25/21 1708 09/27/21 0132  LIPASE 31 29   No results for input(s): AMMONIA in the last 168 hours. CBC: Recent Labs  Lab 09/25/21 1708 09/27/21 0132  WBC 13.8* 11.8*  NEUTROABS 11.9* 10.0*  HGB 15.8* 15.4*  HCT 47.0* 46.6*  MCV 95.9 96.9  PLT 377 273   Cardiac Enzymes: No results for input(s): CKTOTAL, CKMB, CKMBINDEX, TROPONINI in the last 168 hours.  BNP (last 3 results) No results for input(s): BNP in the last 8760 hours.  ProBNP (last 3 results) No results for input(s): PROBNP in the last 8760 hours.  CBG: No results for input(s): GLUCAP in the last 168 hours.  Radiological Exams on Admission: DG Chest 2 View  Result Date: 09/25/2021 CLINICAL DATA:  Fever with sharp left-sided chest pain. EXAM: CHEST - 2 VIEW COMPARISON:  None Available. FINDINGS: The heart size and mediastinal contours are  within normal limits. Mild bronchial wall thickening. No focal airspace consolidation. No pleural effusion. No pneumothorax. The visualized skeletal structures are unremarkable. IMPRESSION: 1. No focal airspace consolidation. 2. Mild bronchial wall thickening, as can be seen with bronchitis/reactive airway disease. Electronically Signed   By: Dahlia Bailiff M.D.   On: 09/25/2021 16:41    EKG: I independently viewed the EKG done and my findings are as followed: EKG was not done in the ED  Assessment/Plan Present on Admission:  Meningitis  Coccydynia  Chronic myofascial pain  Principal Problem:   Meningitis Active Problems:   Nausea & vomiting   Coccydynia   Chronic myofascial pain   Small fiber neuropathy   Epigastric pain   GERD (gastroesophageal reflux disease)   Rash and nonspecific skin eruption   Hypocalcemia   Chronic pain syndrome  Presumed meningitis (bacterial vs viral) Patient presented with fever, neck pain on flexion/extension, nausea, vomiting CSF was suggestive of meningitis Patient was empirically started on ceftriaxone and vancomycin Continue droplet infection Consider ID consult  Nausea and vomiting possibly secondary to above Continue IV Zofran as needed  New onset rash in upper extremities She was empirically started on IV doxycycline Consider ID consult  Hypocalcemia Continue calcium citrate-vitamin D  GERD/epigastric pain Continue Protonix  Small fiber neuropathy/coccydynia/chronic myofascial pain/chronic pain syndrome Continue IV Dilaudid as needed for moderate/severe pain Continue Zanaflex  Asthma Continue Ventolin and Dulera  DVT prophylaxis: Lovenox   Advance Care Planning:   Code Status: Prior   Consults: None  Family Communication: Husband at bedside (all questions answered to satisfaction)  Severity of Illness: The appropriate patient status for this patient is INPATIENT. Inpatient status is judged to be reasonable and necessary in  order to provide the required intensity of service to ensure the patient's safety. The patient's presenting symptoms, physical exam findings, and initial radiographic and laboratory data in the context of their chronic comorbidities is felt to place them at high risk for further clinical deterioration. Furthermore, it is not anticipated that the patient will be medically stable for discharge from the hospital within 2 midnights of admission.   * I certify that at the point of admission it is my clinical judgment that the patient will require inpatient hospital care spanning beyond 2 midnights from the point of admission due to high intensity of service, high risk for further deterioration and high frequency of surveillance required.*  Author: Bernadette Hoit, DO 09/27/2021 7:30 AM  For on call review [email or web address].

## 2021-09-27 NOTE — ED Triage Notes (Addendum)
Pt with emesis since Wednesday. Was seen here after referred to ER by PCP on same date. Pt states they sent her home with Zofran but that "it's not helping". Pt also has other complaints such as Fever, rash to arms, headache, drainage from eyes bilaterally. Pt states had Covid test at that it was negative. Pt last took Tylenol at 2200 for fever of 102 @ home.

## 2021-09-27 NOTE — Progress Notes (Signed)
Pharmacy Antibiotic Note  Katelyn [name] is a 46 y.o. female admitted on 09/27/2021 with fever/headache, possibel meningitis.  Pharmacy has been consulted for Vancomycin  dosing.  Plan: Vancomycin 1500 mg IV now, then Vancomycin 1250 mg IV q12h  Height: 5' 5.5" (166.4 cm) Weight: 92.1 kg (203 lb 0.7 oz) IBW/kg (Calculated) : 58.15  Temp (24hrs), Avg:101.4 F (38.6 C), Min:100.3 F (37.9 C), Max:102.9 F (39.4 C)  Recent Labs  Lab 09/25/21 1708 09/27/21 0132  WBC 13.8* 11.8*  CREATININE 0.73 0.80    Estimated Creatinine Clearance: 99.6 mL/min (by C-G formula based on SCr of 0.8 mg/dL).    Allergies  Allergen Reactions   Ciprofloxacin Other (See Comments)    Nerve pain     Lyrica [Pregabalin] Anaphylaxis   Duloxetine Hcl     Make her more depressed, having nausea, vomiting, headache and threw up.    Feraheme [Ferumoxytol] Hives   Injectafer [Ferric Carboxymaltose] Other (See Comments)    Fevers   Cyanocobalamin     Injectable only - numbness, tingling, muscle cramping, and heart palpations    Penicillins     Has patient had a PCN reaction causing immediate rash, facial/tongue/throat swelling, SOB or lightheadedness with hypotension: Yes Has patient had a PCN reaction causing severe rash involving mucus membranes or skin necrosis: No Has patient had a PCN reaction that required hospitalization No Has patient had a PCN reaction occurring within the last 10 years: No If all of the above answers are "NO", then may proceed with Cephalosporin use.     REACTION: Rash    Caryl Pina 09/27/2021 6:10 AM

## 2021-09-27 NOTE — Progress Notes (Signed)
Patient seen and evaluated, chart reviewed, please see EMR for updated orders. Please see full H&P dictated by admitting physician Dr. Josephine Cables for same date of service.    Brief Summary:- 46 y.o. female with medical history significant of asthma, GERD, small fiber neuropathy, anxiety, coccydynia, chronic myofascial pain, chronic pain disorder admitted on 09/27/21 with clinical concerns for meningitis   A/p 1) presumed meningitis--- patient with persistent headache, fevers, neck pain nausea  -Periorbital and extremity and trunk rash noted please see photos in epic -No significant change in mentation -Discussed with ID physician Dr. Delfina Redwood - order CSF PCR ( to send out a CSF PCR)-- --Continue meningitic doses of Rocephin and vancomycin -Add acyclovir -CSF cell count with 51 WBC to #1 and 33 in tube #4 -CSF Gram stain with GPC in pairs -CSF cultures pending -CSF glucose 59, serum glucose 103  -CSF protein elevated at 64 -WBC down to 11.8 from 13.8 -CSF VDRL and arbovirus IgG pending -[facility] spotted fever IgG and IgM pending -Maintain adequate hydration  2)Small fiber neuropathy/coccydynia/chronic myofascial pain/chronic pain syndrome -Resume home dose on regimen of oxycodone, Continue IV Dilaudid as needed for severe pain Continue Zanaflex and PTA temazepam  3) asthma--continue bronchodilators  4)GERD--- continue Protonix   Physical Exam  Gen:- Awake Alert, in no acute distress  HEENT:-Injected conjunctiva, purulent drainage from both collapse of the eye, right sided periorbital erythema and swelling Neck discomfort and reduced range of motion , especially flexion and extension  Lungs-  CTAB , fair air movement bilaterally  CV- S1, S2 normal, RRR Abd-  +ve B.Sounds, Abd Soft, No tenderness, increased truncal adiposity Extremity- No  edema,   good pedal pulses  Psych-affect is appropriate, oriented x3, no confusion, no mentation concerns Neuro-neck stiffness as  above , patient with chronic back pain but Kernig and Brudzibnski signs are not positive per se , -No new focal deficits, no tremors Skin--    Media Information Document Information  Photos    09/27/2021 15:05  Attached To:  Hospital Encounter on 09/27/21   Source Information  Roxan Hockey, MD  [facility]     Media Information Document Information  Photos  Lt arm  09/27/2021 15:04  Attached To:  Hospital Encounter on 09/27/21   Source Information  Roxan Hockey, MD  [facility]      Media Information Document Information  Photos  Lt forearm  09/27/2021 15:03  Attached To:  Hospital Encounter on 09/27/21   Source Information  Roxan Hockey, MD  [facility]      Media Information Document Information  Photos  Rt arm  09/27/2021 15:04  Attached To:  Hospital Encounter on 09/27/21   Source Information  Roxan Hockey, MD  [facility]   --Total care time is over 48 minutes including time spent coordinating care with infectious disease specialist  -Plan of care discussed with patient and patient's husband at bedside, questions answered    Patient seen and evaluated, chart reviewed, please see EMR for updated orders. Please see full H&P dictated by admitting physician Dr. Josephine Cables for same date of service.   Roxan Hockey, MD

## 2021-09-27 NOTE — ED Provider Notes (Signed)
[facility] Provider Note   CSN: [id number] Arrival date & time: 09/27/21  0009     History  Chief Complaint  Patient presents with   Emesis    Katelyn [name] is a 46 y.o. female.  The history is provided by the patient and a relative.  Patient reports approximately 3 days ago she began having heartburn.  The following day she noted a fever, headache and body aches.  Since that time she has had multiple episodes of vomiting.  She also reports continued right upper quadrant abdominal pain.  No cough, no chest pain or shortness of breath.  No diarrhea.  No recent travel.  No tick bites. She was recently seen by her PCP for similar illness with a negative COVID test, labs and chest x-ray  She reports a history of chronic back pain without surgery. No new arm or leg weakness.  No incontinence.   She is starting to develop a rash on her arms Home Medications Prior to Admission medications   Medication Sig Start Date End Date Taking? Authorizing Provider  albuterol (VENTOLIN HFA) 108 (90 Base) MCG/ACT inhaler Inhale 2 puffs into the lungs every 6 (six) hours as needed for wheezing or shortness of breath. 03/01/21   Margaretha Seeds, MD  budesonide-formoterol [facility]) 80-4.5 MCG/ACT inhaler Inhale 2 puffs into the lungs 2 (two) times daily. Patient taking differently: Inhale 2 puffs into the lungs 2 (two) times daily as needed (asthma). 07/25/19   Margaretha Seeds, MD  buPROPion [facility]) 75 MG tablet Take two tablets (75 mg total dose) by mouth daily. 06/27/21   Arfeen, Arlyce Harman, MD  Calcium Citrate-Vitamin D 500-12.5 MG-MCG CHEW Chew 1 each by mouth daily.    [provider]  Cholecalciferol (VITAMIN D) 50 MCG (2000 UT) CAPS Take 2,000 Units by mouth daily.    [provider]  clonazePAM (KLONOPIN) 0.5 MG tablet Take one tab 2-3 times a day for anxiety 06/27/21   Arfeen, Arlyce Harman, MD  docusate sodium (COLACE) 100 MG capsule Take 100-200 mg  by mouth daily as needed for moderate constipation.    [provider]  fluconazole (DIFLUCAN) 150 MG tablet Take one on day 1,4, and 7 08/08/21   Luking, Elayne Snare, MD  gabapentin (NEURONTIN) 300 MG capsule Take 600 mg by mouth 3 (three) times daily. 01/15/21   Dorene Ar, MD  Lactobacillus Rhamnosus, GG, (CULTURELLE PO) Take 1 capsule by mouth daily.    [provider]  lidocaine (LIDODERM) 5 % Place 1 patch onto the skin daily. Remove & Discard patch within 12 hours or as directed by MD 06/07/21   Vanetta Mulders, MD  Lidocaine 4 % PTCH Apply 1 patch topically daily as needed (pain).    [provider]  magnesium oxide (MAG-OX) 400 MG tablet Take 400 mg by mouth daily.    [provider]  Multiple Vitamins-Minerals (BARIATRIC FUSION) CHEW Chew 1 each by mouth daily.    [provider]  naloxone [facility]) nasal spray 4 mg/0.1 mL Place into the nose as needed.  03/18/18   [provider]  omeprazole (PRILOSEC) 20 MG capsule Take 20 mg by mouth daily as needed (acid reflux). 10/08/20   [provider]  ondansetron (ZOFRAN) 8 MG tablet Take 1 tablet (8 mg total) by mouth every 8 (eight) hours as needed for nausea. 04/09/21   Kathyrn Drown, MD  oxyCODONE-acetaminophen (PERCOCET) 10-325 MG tablet Take by mouth. 05/13/21   [provider]  sucralfate (CARAFATE) 1 g tablet Take 2 g by mouth 2 (two) times daily as needed (stomach ulcer). 09/06/20   [provider]  temazepam (RESTORIL) 30 MG capsule Take 1 capsule (30 mg total) by mouth at bedtime. 06/27/21   Arfeen, Arlyce Harman, MD  tiZANidine (ZANAFLEX) 4 MG tablet Take 4 mg by mouth every 8 (eight) hours as needed for muscle spasms.    [provider]  vitamin C (ASCORBIC ACID) 500 MG tablet Take 500 mg by mouth daily.    [provider]  zinc gluconate 50 MG tablet Take 50 mg by mouth daily.    [provider]  ziprasidone (GEODON) 80 MG capsule Take 1  capsule (80 mg total) by mouth at bedtime. 06/27/21   Arfeen, Arlyce Harman, MD      Allergies    Ciprofloxacin, Lyrica [pregabalin], Duloxetine hcl, Feraheme [ferumoxytol], Injectafer [ferric carboxymaltose], Cyanocobalamin, and Penicillins    Review of Systems   Review of Systems  Constitutional:  Positive for chills, fatigue and fever.  HENT:  Negative for sore throat.   Respiratory:  Negative for cough.   Gastrointestinal:  Positive for abdominal pain and vomiting. Negative for diarrhea.  Genitourinary:  Negative for dysuria.  Musculoskeletal:        Chronic back pain  Skin:  Positive for rash.  Neurological:  Positive for headaches.   Physical Exam Updated Vital Signs BP (!) 107/59   Pulse 90   Temp 100.3 F (37.9 C) (Oral)   Resp 18   Ht 1.664 m (5' 5.5")   Wt 92.1 kg   LMP 09/09/2021   SpO2 96%   BMI 33.27 kg/m  Physical Exam CONSTITUTIONAL: Well developed/well nourished, no acute distress lying on her right side HEAD: Normocephalic/atraumatic EYES: EOMI/PERRL ENMT: Mucous membranes moist, uvula midline without erythema or exudate NECK: supple no meningeal signs SPINE/BACK:entire spine nontender CV: S1/S2 noted, mild tachycardia LUNGS: Lungs are clear to auscultation bilaterally, no apparent distress ABDOMEN: soft, mild RUQ tenderness, no rebound or guarding, bowel sounds noted throughout abdomen GU:no cva tenderness NEURO: Pt is awake/alert/appropriate, moves all extremitiesx4.  No facial droop.   EXTREMITIES: pulses normal/equal, full ROM SKIN: warm, color normal, scattered papules noted to her upper extremities.  No rash noted to the palms or soles PSYCH: no abnormalities of mood noted, alert and oriented to situation  ED Results / Procedures / Treatments   Labs (all labs ordered are listed, but only abnormal results are displayed) Labs Reviewed  CBC WITH DIFFERENTIAL/PLATELET - Abnormal; Notable for the following components:      Result Value   WBC 11.8 (*)     Hemoglobin 15.4 (*)    HCT 46.6 (*)    Neutro Abs 10.0 (*)    All other components within normal limits  COMPREHENSIVE METABOLIC PANEL - Abnormal; Notable for the following components:   Glucose, Bld 103 (*)    Calcium 8.4 (*)    All other components within normal limits  URINALYSIS, ROUTINE W REFLEX MICROSCOPIC - Abnormal; Notable for the following components:   Color, Urine AMBER (*)    APPearance HAZY (*)    Specific Gravity, Urine 1.036 (*)    Ketones, ur 80 (*)    Protein, ur 100 (*)    Bacteria, UA RARE (*)    All other components within normal limits  CSF CELL COUNT WITH DIFFERENTIAL - Abnormal; Notable for the following components:   Appearance, CSF CLEAR (*)    RBC Count,  CSF 9 (*)    WBC, CSF 51 (*)    Segmented Neutrophils-CSF 51 (*)    Lymphs, CSF 12 (*)    All other components within normal limits  CSF CELL COUNT WITH DIFFERENTIAL - Abnormal; Notable for the following components:   RBC Count, CSF 8 (*)    WBC, CSF 33 (*)    Segmented Neutrophils-CSF 50 (*)    Lymphs, CSF 15 (*)    All other components within normal limits  PROTEIN, CSF - Abnormal; Notable for the following components:   Total  Protein, CSF 64 (*)    All other components within normal limits  RESP PANEL BY RT-PCR (FLU A&B, COVID) ARPGX2  CSF CULTURE W GRAM STAIN  LIPASE, BLOOD  PREGNANCY, URINE  GLUCOSE, CSF  VDRL, CSF  ARBOVIRUS IGG, CSF  ROCKY MTN SPOTTED FVR ABS PNL(IGG+IGM)  PATHOLOGIST SMEAR REVIEW    EKG None  Radiology DG Chest 2 View  Result Date: 09/25/2021 CLINICAL DATA:  Fever with sharp left-sided chest pain. EXAM: CHEST - 2 VIEW COMPARISON:  None Available. FINDINGS: The heart size and mediastinal contours are within normal limits. Mild bronchial wall thickening. No focal airspace consolidation. No pleural effusion. No pneumothorax. The visualized skeletal structures are unremarkable. IMPRESSION: 1. No focal airspace consolidation. 2. Mild bronchial wall thickening, as can be  seen with bronchitis/reactive airway disease. Electronically Signed   By: Dahlia Bailiff M.D.   On: 09/25/2021 16:41    Procedures .Lumbar Puncture  Date/Time: 09/27/2021 4:00 AM Performed by: Ripley Fraise, MD Authorized by: Ripley Fraise, MD   Consent:    Consent obtained:  Written   Consent given by:  Patient   Risks discussed:  Bleeding, infection, headache and pain Universal protocol:    Patient identity confirmed:  Provided demographic data and arm band Pre-procedure details:    Procedure purpose:  Diagnostic   Preparation: Patient was prepped and draped in usual sterile fashion   Anesthesia:    Anesthesia method:  Local infiltration   Local anesthetic:  Lidocaine 1% w/o epi Procedure details:    Lumbar space:  L3-L4 interspace   Patient position:  Sitting   Needle gauge:  20   Ultrasound guidance: no     Number of attempts:  3   Fluid appearance:  Clear   Tubes of fluid:  4   Total volume (ml):  6 Post-procedure details:    Puncture site:  Adhesive bandage applied and direct pressure applied   Procedure completion:  Tolerated well, no immediate complications Comments:     Patient initially lying on her right side but was unable to obtain CSF in this position.  I then had her sit upright was able to identify the landmarks and obtain the CSF without difficulty .Critical Care Performed by: Ripley Fraise, MD Authorized by: Ripley Fraise, MD   Critical care provider statement:    Critical care time (minutes):  80   Critical care start time:  09/27/2021 2:40 AM   Critical care end time:  09/27/2021 4:00 AM   Critical care time was exclusive of:  Separately billable procedures and treating other patients   Critical care was necessary to treat or prevent imminent or life-threatening deterioration of the following conditions:  Sepsis and CNS failure or compromise   Critical care was time spent personally by me on the following activities:  Development of treatment  plan with patient or surrogate, evaluation of patient's response to treatment, examination of patient, obtaining history from patient or  surrogate, review of old charts, re-evaluation of patient's condition, pulse oximetry, ordering and review of laboratory studies and ordering and performing treatments and interventions   I assumed direction of critical care for this patient from another provider in my specialty: no     Care discussed with: admitting provider      Medications Ordered in ED Medications  doxycycline (VIBRAMYCIN) 100 mg in sodium chloride 0.9 % 250 mL IVPB (has no administration in time range)  cefTRIAXone (ROCEPHIN) 2 g in sodium chloride 0.9 % 100 mL IVPB (has no administration in time range)  acetaminophen (TYLENOL) tablet 650 mg (650 mg Oral Given 09/27/21 0110)  lactated ringers bolus 1,000 mL (0 mLs Intravenous Stopped 09/27/21 0517)  ondansetron (ZOFRAN) injection 4 mg (4 mg Intravenous Given 09/27/21 0110)  metoCLOPramide (REGLAN) injection 10 mg (10 mg Intravenous Given 09/27/21 0143)  diphenhydrAMINE (BENADRYL) injection 25 mg (25 mg Intravenous Given 09/27/21 0143)  lactated ringers bolus 1,000 mL (0 mLs Intravenous Stopped 09/27/21 0517)  fentaNYL (SUBLIMAZE) injection 100 mcg (100 mcg Intravenous Given 09/27/21 0307)  HYDROmorphone (DILAUDID) injection 0.5 mg (0.5 mg Intravenous Given 09/27/21 0417)  HYDROmorphone (DILAUDID) injection 0.5 mg (0.5 mg Intravenous Given 09/27/21 0514)    ED Course/ Medical Decision Making/ A&P Clinical Course as of 09/27/21 0541  Fri Sep 27, 2021  0202 WBC(!): 11.8 Leukocytosis noted [DW]  0207 Patient presents with ongoing fever.  She is already been seen by her PCP with continued symptoms.  Currently patient has headache and abdominal pain with vomiting, work-up is pending at this time.  She is not septic appearing, and has a normal mental status [DW]  0248 Patient with continued headache.  She has no other new complaints.  She has no abdominal  pain at this time. [DW]  8756 Due to persistent headache and fever of unclear etiology, will proceed with lumbar puncture.  I discussed the risk and benefits of procedure and patient agrees to move forward [DW]  0402 Patient tolerated lumbar puncture well.  Labs are pending at this time [DW]  [phone] Patient with elevated white blood cell count in her CSF.  Given that she has fever, headache and these findings, she will be admitted for meningitis rule out [DW]  [phone] IV antibiotics which include Rocephin, vancomycin and doxycycline in case there is any tickborne illness will be started [DW]  0538 Patient is overall well-appearing, no meningeal signs and has a normal mental status [DW]    Clinical Course User Index [DW] Ripley Fraise, MD                           Medical Decision Making Amount and/or Complexity of Data Reviewed Labs: ordered. Decision-making details documented in ED Course.  Risk OTC drugs. Prescription drug management. Decision regarding hospitalization.   This patient presents to the ED for concern of fever, this involves an extensive number of treatment options, and is a complaint that carries with it a high risk of complications and morbidity.  The differential diagnosis includes but is not limited to meningitis, UTI, intra-abdominal pathology, tickborne illness, pneumonia, influenza  Comorbidities that complicate the patient evaluation: Patient's presentation is complicated by their history of obesity  Additional history obtained: Additional history obtained from family Records reviewed Primary Care Documents  Lab Tests: I Ordered, and personally interpreted labs.  The pertinent results include: Leukocytosis   Cardiac Monitoring: The patient was maintained on a cardiac monitor.  I personally viewed and  interpreted the cardiac monitor which showed an underlying rhythm of:  sinus tachycardia  Medicines ordered and prescription drug management: I ordered  medication including Reglan and Benadryl for headache Reevaluation of the patient after these medicines showed that the patient    stayed the same  Critical Interventions:  lumpar puncture, IV antibiotics  Consultations Obtained: I requested consultation with the admitting physician Dr. Josephine Cables , and discussed  findings as well as pertinent plan - they recommend: Admission  Reevaluation: After the interventions noted above, I reevaluated the patient and found that they have :improved  Complexity of problems addressed: Patient's presentation is most consistent with  acute presentation with potential threat to life or bodily function  Disposition: After consideration of the diagnostic results and the patient's response to treatment,  I feel that the patent would benefit from admission   .           Final Clinical Impression(s) / ED Diagnoses Final diagnoses:  Meningitis    Rx / DC Orders ED Discharge Orders     None         Ripley Fraise, MD 09/27/21 [phone]

## 2021-09-28 DIAGNOSIS — G039 Meningitis, unspecified: Secondary | ICD-10-CM | POA: Diagnosis not present

## 2021-09-28 LAB — COMPREHENSIVE METABOLIC PANEL
ALT: 15 U/L (ref 0–44)
AST: 15 U/L (ref 15–41)
Albumin: 3.1 g/dL — ABNORMAL LOW (ref 3.5–5.0)
Alkaline Phosphatase: 79 U/L (ref 38–126)
Anion gap: 6 (ref 5–15)
BUN: 6 mg/dL (ref 6–20)
CO2: 25 mmol/L (ref 22–32)
Calcium: 8.3 mg/dL — ABNORMAL LOW (ref 8.9–10.3)
Chloride: 106 mmol/L (ref 98–111)
Creatinine, Ser: 0.77 mg/dL (ref 0.44–1.00)
GFR, Estimated: >60 mL/min (ref >60)
Glucose, Bld: 112 mg/dL — ABNORMAL HIGH (ref 70–99)
Potassium: 3.2 mmol/L — ABNORMAL LOW (ref 3.5–5.1)
Sodium: 137 mmol/L (ref 135–145)
Total Bilirubin: 0.4 mg/dL (ref 0.3–1.2)
Total Protein: 6.2 g/dL — ABNORMAL LOW (ref 6.5–8.1)

## 2021-09-28 LAB — CBC
HCT: 36.5 % (ref 36.0–46.0)
Hemoglobin: 12.4 g/dL (ref 12.0–15.0)
MCH: 32.5 pg (ref 26.0–34.0)
MCHC: 34 g/dL (ref 30.0–36.0)
MCV: 95.8 fL (ref 80.0–100.0)
Platelets: 259 10*3/uL (ref 150–400)
RBC: 3.81 MIL/uL — ABNORMAL LOW (ref 3.87–5.11)
RDW: 12.5 % (ref 11.5–15.5)
WBC: 8.6 10*3/uL (ref 4.0–10.5)
nRBC: 0 % (ref 0.0–0.2)

## 2021-09-28 LAB — APTT: aPTT: 35 seconds (ref 24–36)

## 2021-09-28 LAB — HIV ANTIBODY (ROUTINE TESTING W REFLEX): HIV Screen 4th Generation wRfx: NONREACTIVE

## 2021-09-28 LAB — RPR: RPR Ser Ql: NONREACTIVE

## 2021-09-28 MED ORDER — PROCHLORPERAZINE EDISYLATE 10 MG/2ML IJ SOLN: 10.0000 mg | Freq: Once | INTRAMUSCULAR | Status: DC

## 2021-09-28 MED ORDER — DIPHENHYDRAMINE HCL 50 MG/ML IJ SOLN
50.0000 mg | Freq: Once | INTRAMUSCULAR | Status: DC
Start: 2021-09-28 — End: 2021-10-01

## 2021-09-28 MED ORDER — PANTOPRAZOLE SODIUM 40 MG PO TBEC
40.0000 mg | DELAYED_RELEASE_TABLET | Freq: Every day | ORAL | Status: DC
  Filled 2021-09-28: qty 1

## 2021-09-28 MED ORDER — TEMAZEPAM 15 MG PO CAPS
30.0000 mg | ORAL_CAPSULE | Freq: Every evening | ORAL | Status: DC | PRN
  Administered 2021-09-28 – 2021-09-30 (×3): 30 mg via ORAL
  Filled 2021-09-28 (×3): qty 2

## 2021-09-28 MED ORDER — POTASSIUM CHLORIDE CRYS ER 20 MEQ PO TBCR
40.0000 meq | EXTENDED_RELEASE_TABLET | Freq: Once | ORAL | Status: AC
  Administered 2021-09-28: 40 meq via ORAL
  Filled 2021-09-28: qty 2

## 2021-09-28 MED ORDER — SODIUM CHLORIDE 0.9 % IV SOLN: INTRAVENOUS | Status: DC

## 2021-09-28 MED ORDER — KETOROLAC TROMETHAMINE 15 MG/ML IJ SOLN: 15.0000 mg | Freq: Once | INTRAMUSCULAR | Status: DC

## 2021-09-28 NOTE — Progress Notes (Addendum)
Patient complains of nausea and 10/10 headache. Patient currently refusing migraine cocktail ordered by attending, stating she cannot have the Toradol because it is an NSAID. Patient is requesting the Dilaudid for pain.   Patient also refusing SQ Lovenox, and PO Calcium and Vitamin D3. Education was given on the importance of medication.

## 2021-09-28 NOTE — Progress Notes (Signed)
[place] for Infectious Disease  Date of Admission:  09/27/2021   Total days of inpatient antibiotics 2  Principal Problem:   Meningitis Active Problems:   Nausea & vomiting   Coccydynia   Chronic myofascial pain   Small fiber neuropathy   Epigastric pain   GERD (gastroesophageal reflux disease)   Rash and nonspecific skin eruption   Hypocalcemia   Chronic pain syndrome   Sepsis [facility])          Assessment: 46 year old female admitted on 6/2 for nausea/headache/ffever x3 days.  Found to have erythematous papular rash on trunk, periorbital region.  She underwent LP showed pleocytosis.  #Meningitis 2/2 likely viral etiology #Fever and leukocytosis-resolving -LP: CSF 33 wb(50% seg, 15% lymph, no eos, 15% mono);c, 8rbc, glc 59, PTN 64, VDRL, arbovirus pending CSF Cx pending, stain shows GPC in chains -[place] ticks off [place] regularly, but has not  noted any on herself -HIV negative -Agree that clinical picture is most c/w viral etiology. Work-up pending Recommendations: -Continue vancomycin, ceftriaxone, acyclovir - RMSF(serum) follow-up - Follow CSF Cx, Arbovirus CSF, ME panel, VDRL - Will ADD HSV and VZV PCR-called [facility] and specimen most likely at [facility].   Microbiology:   Antibiotics: Acyclovir 6/2-p CTX 6/2 Doxy 6/1 Vancomycin 6/2-p Cultures:  Other LP: CSF 33 wb(50% seg, 15% lymph, no eos, 15% mono);c, 8rbc, glc 59, PTN 64, VDRL, arbovirus pending CSF Cx pending, stain shows GPC in chains  SUBJECTIVE: Resting in bed. No new complaints Interval: Afebrile and no leukocytosis Review of Systems: Review of Systems  All other systems reviewed and are negative.   Scheduled Meds:  calcium carbonate  1 tablet Oral Q breakfast   cholecalciferol  1,000 Units Oral Daily   diphenhydrAMINE  50 mg Intravenous Once   enoxaparin (LOVENOX) injection  40 mg Subcutaneous Q24H   gabapentin   600 mg Oral TID   ketorolac  15 mg Intravenous Once   mometasone-formoterol  2 puff Inhalation BID   [START ON 09/29/2021] pantoprazole  40 mg Oral Daily   prochlorperazine  10 mg Intravenous Once   Continuous Infusions:  sodium chloride 100 mL/hr at 09/28/21 1441   acyclovir 720 mg (09/28/21 1523)   cefTRIAXone (ROCEPHIN)  IV     vancomycin 1,250 mg (09/28/21 1224)   PRN Meds:.acetaminophen, albuterol, HYDROmorphone (DILAUDID) injection, ondansetron (ZOFRAN) IV, oxyCODONE, tiZANidine Allergies  Allergen Reactions   Ciprofloxacin Other (See Comments)    Nerve pain     Lyrica [Pregabalin] Anaphylaxis   Duloxetine Hcl     Make her more depressed, having nausea, vomiting, headache and threw up.    Feraheme [Ferumoxytol] Hives   Injectafer [Ferric Carboxymaltose] Other (See Comments)    Fevers   Cyanocobalamin     Injectable only - numbness, tingling, muscle cramping, and heart palpations    Penicillins     Has patient had a PCN reaction causing immediate rash, facial/tongue/throat swelling, SOB or lightheadedness with hypotension: Yes Has patient had a PCN reaction causing severe rash involving mucus membranes or skin necrosis: No Has patient had a PCN reaction that required hospitalization No Has patient had a PCN reaction occurring within the last 10 years: No If all of the above answers are "NO", then may proceed with Cephalosporin use.     REACTION: Rash    OBJECTIVE: Vitals:   09/28/21 0205 09/28/21 8841 09/28/21 6606 09/28/21 1809  BP: 114/66 121/77 110/70 116/65  Pulse: 99 87 72 68  Resp: '17 18 18 18  '$ Temp: 99.8 F (37.7 C) 100.1 F (37.8 C) 99.9 F (37.7 C) 98.9 F (37.2 C)  TempSrc: Oral Oral Oral   SpO2: 95% 97% 94% 98%  Weight:      Height:       Body mass index is 33.27 kg/m.  Physical Exam Constitutional:      Appearance: Normal appearance.  HENT:     Head: Normocephalic and atraumatic.     Right Ear: Tympanic membrane normal.     Left Ear:  Tympanic membrane normal.     Nose: Nose normal.     Mouth/Throat:     Mouth: Mucous membranes are moist.  Eyes:     Extraocular Movements: Extraocular movements intact.     Conjunctiva/sclera: Conjunctivae normal.     Pupils: Pupils are equal, round, and reactive to light.  Cardiovascular:     Rate and Rhythm: Normal rate and regular rhythm.     Heart sounds: No murmur heard.   No friction rub. No gallop.  Pulmonary:     Effort: Pulmonary effort is normal.     Breath sounds: Normal breath sounds.  Abdominal:     General: Abdomen is flat.     Palpations: Abdomen is soft.  Musculoskeletal:        General: Normal range of motion.  Skin:    General: Skin is warm and dry.     Comments: Petechial rash on b/l UE. Imaging in chart.  Periorbital rash R>L  Neurological:     General: No focal deficit present.     Mental Status: She is alert and oriented to person, place, and time.  Psychiatric:        Mood and Affect: Mood normal.      Lab Results Lab Results  Component Value Date   WBC 8.6 09/28/2021   HGB 12.4 09/28/2021   HCT 36.5 09/28/2021   MCV 95.8 09/28/2021   PLT 259 09/28/2021    Lab Results  Component Value Date   CREATININE 0.77 09/28/2021   BUN 6 09/28/2021   NA 137 09/28/2021   K 3.2 (L) 09/28/2021   CL 106 09/28/2021   CO2 25 09/28/2021    Lab Results  Component Value Date   ALT 15 09/28/2021   AST 15 09/28/2021   ALKPHOS 79 09/28/2021   BILITOT 0.4 09/28/2021        Laurice Record, [place] for Infectious Disease [facility] 09/28/2021, 7:20 PM

## 2021-09-28 NOTE — Progress Notes (Signed)
NEW ADMISSION NOTE New Admission Note:   Arrival Method: Stretcher from [place] Orientation: Alert and Oriented x4 Telemetry: Tele Box #13 Assessment: To be completed Skin: Intact. Bilateral rash on upper extremities. IV: Right A.C. saline locked Pain: 9/10. Provided patient with pain medication Tubes: None Safety Measures: Safety Fall Prevention Plan has been given, discussed and signed Admission: Initiated [facility] Orientation: Patient has been orientated to the room, unit and staff.  Family: Not present at the bedside  Orders have been reviewed and implemented. Will continue to monitor the patient. Call light has been placed within reach and bed alarm has been activated.   Sharmon Revere, RN

## 2021-09-28 NOTE — Progress Notes (Signed)
PROGRESS NOTE  Katelyn [name]  WLN:[id number] DOB: [birth year] DOA: 09/27/2021 PCP: Kathyrn Drown, MD   Brief Narrative: Patient is a 46 year old female with history of asthma, GERD, anxiety, chronic pain syndrome who presents to the emergency department at [facility] hospital with fever, chest pain, neck pain, headache, body aches, vomiting, rash on upper extremities.  On presentation she was febrile, tachypneic, hypertensive.  Blood work showed leukocytosis.  Due to presentation with fever, headache, lumbar puncture was done, CSF analysis showed elevated WBC with segmented neutrophils, elevated protein.  Patient was started on empiric antibiotics.  ID consulted.   Assessment & Plan:  Principal Problem:   Meningitis Active Problems:   Nausea & vomiting   Coccydynia   Chronic myofascial pain   Small fiber neuropathy   Epigastric pain   GERD (gastroesophageal reflux disease)   Rash and nonspecific skin eruption   Hypocalcemia   Chronic pain syndrome   Sepsis ([place])  Meningitis: Presented with several days history of fever, neck pain, headache, nausea, vomiting.  Blood work showed leukocytosis.  CSF analysis showed elevated WBC with segmented neutrophils, elevated protein.  Gram stain showed GPC in pairs.  No report of encephalopathy.  Questionable history of tick bite.  Patient was started on empiric antibiotics.  ID consulted.  Follow-up final CSF culture, HSV PCR.  Currently on vancomycin, ceftriaxone, acyclovir.  Nausea/vomiting: Continue symptomatic treatment with Zofran.  On gentle IV fluids  Upper extremity rash: ID following.  Continue current antibiotics.  Follow-up HIV antibody, RPR, VDRL, [place] spotted fever work-up  GERD/epigastric pain: Continue Protonix  Chronic pain syndrome: History of resolved fiber neuropathy, coccydynia, chronic myofascial pain.  On narcotics at home.  Continue supportive care, pain management  Hypokalemia: Supplemented with  potassium  History of asthma: Not in exacerbation.  Continue Ventolin, Dulera    DVT prophylaxis:enoxaparin (LOVENOX) injection 40 mg Start: 09/27/21 1100 SCDs Start: 09/27/21 1015     Code Status: Full Code  Family Communication: Husband at bedside  Patient status:Inpatient  Patient is from :Home  Anticipated discharge DE:YCXK  Estimated DC date: Not sure, needs treatment plan for meningitis if approved   Consultants: ID  Procedures: Lumbar puncture  Antimicrobials:  Anti-infectives (From admission, onward)    Start     Dose/Rate Route Frequency Ordered Stop   09/28/21 2200  cefTRIAXone (ROCEPHIN) 2 g in sodium chloride 0.9 % 100 mL IVPB        2 g 200 mL/hr over 30 Minutes Intravenous Every 12 hours 09/27/21 0716     09/27/21 1800  vancomycin (VANCOREADY) IVPB 1250 mg/250 mL        1,250 mg 166.7 mL/hr over 90 Minutes Intravenous Every 12 hours 09/27/21 0612     09/27/21 1400  acyclovir (ZOVIRAX) 720 mg in dextrose 5 % 250 mL IVPB        10 mg/kg  71.8 kg (Adjusted) 264.4 mL/hr over 60 Minutes Intravenous Every 8 hours 09/27/21 1109     09/27/21 0600  vancomycin (VANCOREADY) IVPB 1500 mg/300 mL        1,500 mg 150 mL/hr over 120 Minutes Intravenous  Once 09/27/21 0546 09/27/21 1205   09/27/21 0545  doxycycline (VIBRAMYCIN) 100 mg in sodium chloride 0.9 % 250 mL IVPB  Status:  Discontinued        100 mg 125 mL/hr over 120 Minutes Intravenous Every 12 hours 09/27/21 0530 09/27/21 1126   09/27/21 0545  cefTRIAXone (ROCEPHIN) 2 g in sodium chloride 0.9 % 100 mL  IVPB        2 g 200 mL/hr over 30 Minutes Intravenous  Once 09/27/21 0537 09/27/21 0910       Subjective: Patient seen and examined at the bedside this morning.  Complains of headache today.  No nausea or vomiting.  Husband at the bedside.  Afebrile, hemodynamically stable  Objective: Vitals:   09/27/21 2008 09/27/21 2230 09/28/21 0205 09/28/21 0618  BP: 118/62 123/68 114/66 121/77  Pulse: 77 75 99 87   Resp: '18 18 17 18  '$ Temp: 99.7 F (37.6 C) 99.5 F (37.5 C) 99.8 F (37.7 C) 100.1 F (37.8 C)  TempSrc: Oral Oral Oral Oral  SpO2: 98% 98% 95% 97%  Weight:      Height:        Intake/Output Summary (Last 24 hours) at 09/28/2021 0823 Last data filed at 09/28/2021 0641 Gross per 24 hour  Intake 2719.87 ml  Output 0 ml  Net 2719.87 ml   Filed Weights   09/27/21 0026  Weight: 92.1 kg    Examination:  General exam: Overall comfortable, not in distress,obese HEENT: PERRL Respiratory system:  no wheezes or crackles  Cardiovascular system: S1 & S2 heard, RRR.  Gastrointestinal system: Abdomen is nondistended, soft and nontender. Central nervous system: Alert and oriented Extremities: No edema, no clubbing ,no cyanosis Skin: No rashes, no ulcers,no icterus     Data Reviewed: I have personally reviewed following labs and imaging studies  CBC: Recent Labs  Lab 09/25/21 1708 09/27/21 0132 09/28/21 0224  WBC 13.8* 11.8* 8.6  NEUTROABS 11.9* 10.0*  --   HGB 15.8* 15.4* 12.4  HCT 47.0* 46.6* 36.5  MCV 95.9 96.9 95.8  PLT 377 273 761   Basic Metabolic Panel: Recent Labs  Lab 09/25/21 1708 09/27/21 0132 09/28/21 0224  NA 138 138 137  K 3.7 4.6 3.2*  CL 106 105 106  CO2 '26 22 25  '$ GLUCOSE 109* 103* 112*  BUN '12 13 6  '$ CREATININE 0.73 0.80 0.77  CALCIUM 9.2 8.4* 8.3*  MG  --  2.0  --   PHOS  --  3.1  --      Recent Results (from the past 240 hour(s))  Novel Coronavirus, NAA ([facility])     Status: None   Collection Time: 09/25/21 12:00 AM   Specimen: Nasopharyngeal(NP) swabs in vial transport medium   Nasopharynge  Previous  Result Value Ref Range Status   SARS-CoV-2, NAA Not Detected Not Detected Final    Comment: This nucleic acid amplification test was developed and its performance characteristics determined by [employer], [employer] and [employer]. Nucleic acid amplification tests include RT-PCR and TMA. This test has not been FDA cleared or approved. This test has been  authorized by FDA under an Emergency Use Authorization (EUA). This test is only authorized for the duration of time the declaration that circumstances exist justifying the authorization of the emergency use of in vitro diagnostic tests for detection of SARS-CoV-2 virus and/or diagnosis of COVID-19 infection under section 564(b)(1) of the Act, 21 U.S.C. 607PXT-0(G) (1), unless the authorization is terminated or revoked sooner. When diagnostic testing is negative, the possibility of a false negative result should be considered in the context of a patient's recent exposures and the presence of clinical signs and symptoms consistent with COVID-19. An individual without symptoms of COVID-19 and who is not shedding SARS-CoV-2 virus wo uld expect to have a negative (not detected) result in this assay.   Urine Culture     Status: None  Collection Time: 09/25/21 12:00 AM   Specimen: Urine   Urine  Result Value Ref Range Status   Urine Culture, Routine Final report  Final   Organism ID, Bacteria Comment  Final    Comment: Mixed urogenital flora 10,000-25,000 colony forming units per mL   Resp Panel by RT-PCR (Flu A&B, Covid) Urine, Clean Catch     Status: None   Collection Time: 09/27/21  2:33 AM   Specimen: Urine, Clean Catch; Nasal Swab  Result Value Ref Range Status   SARS Coronavirus 2 by RT PCR NEGATIVE NEGATIVE Final    Comment: (NOTE) SARS-CoV-2 target nucleic acids are NOT DETECTED.  The SARS-CoV-2 RNA is generally detectable in upper respiratory specimens during the acute phase of infection. The lowest concentration of SARS-CoV-2 viral copies this assay can detect is 138 copies/mL. A negative result does not preclude SARS-Cov-2 infection and should not be used as the sole basis for treatment or other patient management decisions. A negative result may occur with  improper specimen collection/handling, submission of specimen other than nasopharyngeal swab, presence of viral  mutation(s) within the areas targeted by this assay, and inadequate number of viral copies(<138 copies/mL). A negative result must be combined with clinical observations, patient history, and epidemiological information. The expected result is Negative.  Fact Sheet for Patients:  EntrepreneurPulse.com.au  Fact Sheet for Healthcare Providers:  IncredibleEmployment.be  This test is no t yet approved or cleared by the [place] FDA and  has been authorized for detection and/or diagnosis of SARS-CoV-2 by FDA under an Emergency Use Authorization (EUA). This EUA will remain  in effect (meaning this test can be used) for the duration of the COVID-19 declaration under Section 564(b)(1) of the Act, 21 U.S.C.section 360bbb-3(b)(1), unless the authorization is terminated  or revoked sooner.       Influenza A by PCR NEGATIVE NEGATIVE Final   Influenza B by PCR NEGATIVE NEGATIVE Final    Comment: (NOTE) The Xpert Xpress SARS-CoV-2/FLU/RSV plus assay is intended as an aid in the diagnosis of influenza from Nasopharyngeal swab specimens and should not be used as a sole basis for treatment. Nasal washings and aspirates are unacceptable for Xpert Xpress SARS-CoV-2/FLU/RSV testing.  Fact Sheet for Patients: EntrepreneurPulse.com.au  Fact Sheet for Healthcare Providers: IncredibleEmployment.be  This test is not yet approved or cleared by the [place] FDA and has been authorized for detection and/or diagnosis of SARS-CoV-2 by FDA under an Emergency Use Authorization (EUA). This EUA will remain in effect (meaning this test can be used) for the duration of the COVID-19 declaration under Section 564(b)(1) of the Act, 21 U.S.C. section 360bbb-3(b)(1), unless the authorization is terminated or revoked.  Performed at [facility], [address], [place], [place] [address]   CSF culture     Status: None (Preliminary  result)   Collection Time: 09/27/21  3:55 AM   Specimen: CSF; Cerebrospinal Fluid  Result Value Ref Range Status   Specimen Description   Final    CSF Performed at [facility], [address], [place], [place] [address]    Special Requests   Final    NONE Performed at [facility], [address], [place], [place] [address]    Gram Stain   Final    WBC PRESENT, PREDOMINANTLY PMN GRAM POSITIVE COCCI IN PAIRS CYTOSPIN SMEAR CRITICAL RESULT CALLED TO, READ BACK BY AND VERIFIED WITH: PHARMD STEVEN HURTH ON 09/27/21 @ 1513 BY DRT CORRECTED RESULTS PREVIOUSLY REPORTED AS: NO ORGANISMS SEEN  Culture   Final    NO GROWTH < 24 HOURS Performed at [facility], [place] [address], [place], [place] [address]    Report Status PENDING  Incomplete     Radiology Studies: No results found.  Scheduled Meds:  calcium carbonate  1 tablet Oral Q breakfast   cholecalciferol  1,000 Units Oral Daily   enoxaparin (LOVENOX) injection  40 mg Subcutaneous Q24H   gabapentin  600 mg Oral TID   mometasone-formoterol  2 puff Inhalation BID   pantoprazole (PROTONIX) IV  40 mg Intravenous Q24H   potassium chloride  40 mEq Oral Once   Continuous Infusions:  sodium chloride Stopped (09/27/21 1501)   acyclovir 720 mg (09/28/21 0610)   cefTRIAXone (ROCEPHIN)  IV     vancomycin 1,250 mg (09/27/21 2237)     LOS: 1 day   Shelly Coss, MD [facility] P6/06/2021, 8:23 AM

## 2021-09-29 DIAGNOSIS — G039 Meningitis, unspecified: Secondary | ICD-10-CM | POA: Diagnosis not present

## 2021-09-29 MED ORDER — OMEPRAZOLE 20 MG PO CPDR
20.0000 mg | DELAYED_RELEASE_CAPSULE | Freq: Every day | ORAL | Status: DC
  Administered 2021-09-29 – 2021-10-01 (×3): 20 mg via ORAL
  Filled 2021-09-29 (×4): qty 1

## 2021-09-29 MED ORDER — BUPROPION HCL 75 MG PO TABS
150.0000 mg | ORAL_TABLET | Freq: Every day | ORAL | Status: DC
  Administered 2021-09-29 – 2021-10-01 (×3): 150 mg via ORAL
  Filled 2021-09-29 (×3): qty 2

## 2021-09-29 MED ORDER — POTASSIUM CHLORIDE CRYS ER 20 MEQ PO TBCR
40.0000 meq | EXTENDED_RELEASE_TABLET | Freq: Once | ORAL | Status: AC
  Administered 2021-09-29: 40 meq via ORAL
  Filled 2021-09-29: qty 2

## 2021-09-29 NOTE — TOC Initial Note (Signed)
Transition of Care [facility]) - Initial/Assessment Note    Patient Details  Name: Katelyn [name] MRN: [id number] Date of Birth: 15-Nov-1975  Transition of Care [facility]) CM/SW Contact:    Verdell Carmine, RN Phone Number: 09/29/2021, 1:16 PM  Clinical Narrative:                 Transition of Care Department [facility]) has reviewed patient and no TOC needs have been identified at this time. We will continue to monitor patient advancement through interdisciplinary progression rounds. If new patient transition needs arise, please place a TOC consult.  Expected Discharge Plan: Home/Self Care Barriers to Discharge: Continued Medical Work up   Patient Goals and CMS Choice        Expected Discharge Plan and Services Expected Discharge Plan: Home/Self Care                                              Prior Living Arrangements/Services                       Activities of Daily Living Home Assistive Devices/Equipment: Cane (specify quad or straight), Walker (specify type) ADL Screening (condition at time of admission) Patient's cognitive ability adequate to safely complete daily activities?: Yes Is the patient deaf or have difficulty hearing?: No Does the patient have difficulty seeing, even when wearing glasses/contacts?: No Does the patient have difficulty concentrating, remembering, or making decisions?: No Patient able to express need for assistance with ADLs?: Yes Does the patient have difficulty dressing or bathing?: Yes Independently performs ADLs?: Yes (appropriate for developmental age) Communication: Independent Dressing (OT): Needs assistance Is this a change from baseline?: Pre-admission baseline Grooming: Independent Feeding: Independent Bathing: Independent Toileting: Independent In/Out Bed: Independent Walks in Home: Independent Does the patient have difficulty walking or climbing stairs?: Yes Weakness of Legs: Left Weakness of Arms/Hands:  None  Permission Sought/Granted                  Emotional Assessment              Admission diagnosis:  Meningitis [G03.9] Patient Active Problem List   Diagnosis Date Noted   Meningitis 09/27/2021   Epigastric pain 09/27/2021   GERD (gastroesophageal reflux disease) 09/27/2021   Rash and nonspecific skin eruption 09/27/2021   Hypocalcemia 09/27/2021   Chronic pain syndrome 09/27/2021   Sepsis ([place])    Fever 09/25/2021   Tear of left gluteus medius tendon    Asthma 07/18/2020   Vitamin B 12 deficiency 10/16/2019   Elevated LFTs 10/16/2019   Asthma, exogenous, unspecified asthma severity, uncomplicated 84/13/2440   B12 deficiency anemia 06/21/2018   Venous insufficiency 04/23/2018   Onychomycosis of toenail 04/17/2018   Chronic myofascial pain 03/18/2018   Small fiber neuropathy 03/18/2018   Idiopathic small fiber sensory neuropathy 02/23/2018   Varicose veins of both lower extremities 01/13/2018   Cervical spondylosis without myelopathy 08/28/2017   Migraine with aura and without status migrainosus, not intractable 07/22/2017   Lumbar degenerative disc disease 07/20/2017   Spondylosis of lumbar spine 07/20/2017   Coccydynia 03/12/2017   Bariatric surgery status 05/02/2016   Major depressive disorder, recurrent episode, moderate ([place]) 02/18/2016   Tachy-brady syndrome ([place]) 12/19/2015   Iron deficiency anemia 06/10/2015   Sleep disorder 06/01/2013   Generalized anxiety disorder 05/30/2013   Chronic headaches 07/01/2012  Headache 07/01/2012   Depression, major, recurrent, severe with psychosis ([place]) 06/17/2012   Severe recurrent major depressive disorder with psychotic symptoms ([place]) 06/17/2012   Family history of colonic polyps 03/03/2012   Eating disorder 12/18/2011   Nausea & vomiting 01/22/2010   Polycystic ovarian syndrome 09/01/2008   Intermittent left-sided chest pain 08/02/2008   PCP:  Kathyrn Drown, MD Pharmacy:   [facility], [place] [address] Phone: [phone] Fax: [phone]  [id number] - [place], [address] AT [address] [place] [address] Phone: [phone] Fax: [phone]  [place] [name] [place] [address] Phone: [phone] Fax: [phone]  [id number] [place] - [address] [place] [address]Phone: [phone]: [phone]    Social Determinants of Health (SDOH) Interventions    Readmission Risk Interventions     View : No data to display.

## 2021-09-29 NOTE — Progress Notes (Signed)
PROGRESS NOTE  Katelyn [name]  YPP:[id number] DOB: 05/14/1975 DOA: 09/27/2021 PCP: Kathyrn Drown, MD   Brief Narrative: Patient is a 46 year old female with history of asthma, GERD, anxiety, chronic pain syndrome who presents to the emergency department at [facility] hospital with fever, chest pain, neck pain, headache, body aches, vomiting, rash on upper extremities.  On presentation she was febrile, tachypneic, hypertensive.  Lab work showed leukocytosis.  Due to presentation with fever, headache, lumbar puncture was done, CSF analysis showed elevated WBC with segmented neutrophils, elevated protein.  Patient was started on empiric antibiotics.  ID consulted.   Assessment & Plan:  Principal Problem:   Meningitis Active Problems:   Nausea & vomiting   Coccydynia   Chronic myofascial pain   Small fiber neuropathy   Epigastric pain   GERD (gastroesophageal reflux disease)   Rash and nonspecific skin eruption   Hypocalcemia   Chronic pain syndrome   Sepsis ([place])  Meningitis: Presented with several days history of fever, neck pain, headache, nausea, vomiting.  Blood work showed leukocytosis.  CSF analysis showed elevated WBC with segmented neutrophils, elevated protein.  Gram stain showed GPC in pairs.  No report of encephalopathy.  Questionable history of tick bite.  Patient was started on empiric antibiotics.  ID consulted.  Follow-up final CSF culture( no growth so far), RMSF workup.  Currently on vancomycin, ceftriaxone, acyclovir.  Nausea/vomiting/headache: Continue symptomatic treatment with Zofran.  On gentle IV fluids.  The symptoms have been slightly better.  Headache is better  Upper extremity rash: ID following.  Continue current antibiotics.  Rash have faded  GERD/epigastric pain: Continue Protonix  Chronic pain syndrome: History of resolved fiber neuropathy, coccydynia, chronic myofascial pain.  On narcotics at home.  Continue supportive care, pain  management  Hypokalemia: Supplemented with potassium  History of asthma: Not in exacerbation.  Continue Ventolin, Dulera  Obesity: BMI 33.2    DVT prophylaxis:enoxaparin (LOVENOX) injection 40 mg Start: 09/27/21 1100 SCDs Start: 09/27/21 1015     Code Status: Full Code  Family Communication: Husband at bedside  Patient status:Inpatient  Patient is from :Home  Anticipated discharge WP:YKDX  Estimated DC date: Need to wait for final result of CSF analysis/culture  Consultants: ID  Procedures: Lumbar puncture  Antimicrobials:  Anti-infectives (From admission, onward)    Start     Dose/Rate Route Frequency Ordered Stop   09/28/21 2200  cefTRIAXone (ROCEPHIN) 2 g in sodium chloride 0.9 % 100 mL IVPB        2 g 200 mL/hr over 30 Minutes Intravenous Every 12 hours 09/27/21 0716     09/27/21 1800  vancomycin (VANCOREADY) IVPB 1250 mg/250 mL        1,250 mg 166.7 mL/hr over 90 Minutes Intravenous Every 12 hours 09/27/21 0612     09/27/21 1400  acyclovir (ZOVIRAX) 720 mg in dextrose 5 % 250 mL IVPB        10 mg/kg  71.8 kg (Adjusted) 264.4 mL/hr over 60 Minutes Intravenous Every 8 hours 09/27/21 1109     09/27/21 0600  vancomycin (VANCOREADY) IVPB 1500 mg/300 mL        1,500 mg 150 mL/hr over 120 Minutes Intravenous  Once 09/27/21 0546 09/27/21 1205   09/27/21 0545  doxycycline (VIBRAMYCIN) 100 mg in sodium chloride 0.9 % 250 mL IVPB  Status:  Discontinued        100 mg 125 mL/hr over 120 Minutes Intravenous Every 12 hours 09/27/21 0530 09/27/21 1126   09/27/21 0545  cefTRIAXone (ROCEPHIN) 2 g in sodium chloride 0.9 % 100 mL IVPB        2 g 200 mL/hr over 30 Minutes Intravenous  Once 09/27/21 0537 09/27/21 0910       Subjective: Patient seen and examined at the bedside this morning.  Hemodynamically stable.  She looks better than yesterday.  Her headache is slightly better.  She still has some nausea but better than yesterday.  Afebrile  Objective: Vitals:    09/28/21 1809 09/28/21 1950 09/29/21 0540 09/29/21 0944  BP: 116/65 123/81 133/79 125/65  Pulse: 68 83 79 63  Resp: '18 18 18 18  '$ Temp: 98.9 F (37.2 C) 99.2 F (37.3 C) 98.8 F (37.1 C) 98.6 F (37 C)  TempSrc:  Oral Oral   SpO2: 98% 98% 99%   Weight:      Height:        Intake/Output Summary (Last 24 hours) at 09/29/2021 1135 Last data filed at 09/29/2021 0900 Gross per 24 hour  Intake 3300.96 ml  Output 0 ml  Net 3300.96 ml   Filed Weights   09/27/21 0026  Weight: 92.1 kg    Examination:  General exam: Overall comfortable, not in distress,obese HEENT: PERRL Respiratory system:  no wheezes or crackles  Cardiovascular system: S1 & S2 heard, RRR.  Gastrointestinal system: Abdomen is nondistended, soft and nontender. Central nervous system: Alert and oriented Extremities: No edema, no clubbing ,no cyanosis Skin: No rashes, no ulcers,no icterus     Data Reviewed: I have personally reviewed following labs and imaging studies  CBC: Recent Labs  Lab 09/25/21 1708 09/27/21 0132 09/28/21 0224  WBC 13.8* 11.8* 8.6  NEUTROABS 11.9* 10.0*  --   HGB 15.8* 15.4* 12.4  HCT 47.0* 46.6* 36.5  MCV 95.9 96.9 95.8  PLT 377 273 098   Basic Metabolic Panel: Recent Labs  Lab 09/25/21 1708 09/27/21 0132 09/28/21 0224  NA 138 138 137  K 3.7 4.6 3.2*  CL 106 105 106  CO2 '26 22 25  '$ GLUCOSE 109* 103* 112*  BUN '12 13 6  '$ CREATININE 0.73 0.80 0.77  CALCIUM 9.2 8.4* 8.3*  MG  --  2.0  --   PHOS  --  3.1  --      Recent Results (from the past 240 hour(s))  Novel Coronavirus, NAA ([facility])     Status: None   Collection Time: 09/25/21 12:00 AM   Specimen: Nasopharyngeal(NP) swabs in vial transport medium   Nasopharynge  Previous  Result Value Ref Range Status   SARS-CoV-2, NAA Not Detected Not Detected Final    Comment: This nucleic acid amplification test was developed and its performance characteristics determined by [employer], [employer] and [employer]. Nucleic acid amplification tests  include RT-PCR and TMA. This test has not been FDA cleared or approved. This test has been authorized by FDA under an Emergency Use Authorization (EUA). This test is only authorized for the duration of time the declaration that circumstances exist justifying the authorization of the emergency use of in vitro diagnostic tests for detection of SARS-CoV-2 virus and/or diagnosis of COVID-19 infection under section 564(b)(1) of the Act, 21 U.S.C. 119JYN-8(G) (1), unless the authorization is terminated or revoked sooner. When diagnostic testing is negative, the possibility of a false negative result should be considered in the context of a patient's recent exposures and the presence of clinical signs and symptoms consistent with COVID-19. An individual without symptoms of COVID-19 and who is not shedding SARS-CoV-2 virus wo uld expect to have a  negative (not detected) result in this assay.   Urine Culture     Status: None   Collection Time: 09/25/21 12:00 AM   Specimen: Urine   Urine  Result Value Ref Range Status   Urine Culture, Routine Final report  Final   Organism ID, Bacteria Comment  Final    Comment: Mixed urogenital flora 10,000-25,000 colony forming units per mL   Resp Panel by RT-PCR (Flu A&B, Covid) Urine, Clean Catch     Status: None   Collection Time: 09/27/21  2:33 AM   Specimen: Urine, Clean Catch; Nasal Swab  Result Value Ref Range Status   SARS Coronavirus 2 by RT PCR NEGATIVE NEGATIVE Final    Comment: (NOTE) SARS-CoV-2 target nucleic acids are NOT DETECTED.  The SARS-CoV-2 RNA is generally detectable in upper respiratory specimens during the acute phase of infection. The lowest concentration of SARS-CoV-2 viral copies this assay can detect is 138 copies/mL. A negative result does not preclude SARS-Cov-2 infection and should not be used as the sole basis for treatment or other patient management decisions. A negative result may occur with  improper specimen  collection/handling, submission of specimen other than nasopharyngeal swab, presence of viral mutation(s) within the areas targeted by this assay, and inadequate number of viral copies(<138 copies/mL). A negative result must be combined with clinical observations, patient history, and epidemiological information. The expected result is Negative.  Fact Sheet for Patients:  EntrepreneurPulse.com.au  Fact Sheet for Healthcare Providers:  IncredibleEmployment.be  This test is no t yet approved or cleared by the [place] FDA and  has been authorized for detection and/or diagnosis of SARS-CoV-2 by FDA under an Emergency Use Authorization (EUA). This EUA will remain  in effect (meaning this test can be used) for the duration of the COVID-19 declaration under Section 564(b)(1) of the Act, 21 U.S.C.section 360bbb-3(b)(1), unless the authorization is terminated  or revoked sooner.       Influenza A by PCR NEGATIVE NEGATIVE Final   Influenza B by PCR NEGATIVE NEGATIVE Final    Comment: (NOTE) The Xpert Xpress SARS-CoV-2/FLU/RSV plus assay is intended as an aid in the diagnosis of influenza from Nasopharyngeal swab specimens and should not be used as a sole basis for treatment. Nasal washings and aspirates are unacceptable for Xpert Xpress SARS-CoV-2/FLU/RSV testing.  Fact Sheet for Patients: EntrepreneurPulse.com.au  Fact Sheet for Healthcare Providers: IncredibleEmployment.be  This test is not yet approved or cleared by the [place] FDA and has been authorized for detection and/or diagnosis of SARS-CoV-2 by FDA under an Emergency Use Authorization (EUA). This EUA will remain in effect (meaning this test can be used) for the duration of the COVID-19 declaration under Section 564(b)(1) of the Act, 21 U.S.C. section 360bbb-3(b)(1), unless the authorization is terminated or revoked.  Performed at [facility], [address], [place], [place] [address]   CSF culture     Status: None (Preliminary result)   Collection Time: 09/27/21  3:55 AM   Specimen: CSF; Cerebrospinal Fluid  Result Value Ref Range Status   Specimen Description   Final    CSF Performed at [facility], [address] [place], [place] [address]    Special Requests   Final    NONE Performed at [facility], [address] [place], [place] [address]    Gram Stain   Final    WBC PRESENT, PREDOMINANTLY PMN GRAM POSITIVE COCCI IN PAIRS CYTOSPIN SMEAR CRITICAL RESULT CALLED TO, READ BACK BY AND VERIFIED WITH: PHARMD  STEVEN HURTH ON 09/27/21 @ 1513 BY DRT CORRECTED RESULTS PREVIOUSLY REPORTED AS: NO ORGANISMS SEEN    Culture   Final    NO GROWTH 2 DAYS Performed at [facility], [place] [address], [place], [place] [address]    Report Status PENDING  Incomplete     Radiology Studies: No results found.  Scheduled Meds:  calcium carbonate  1 tablet Oral Q breakfast   cholecalciferol  1,000 Units Oral Daily   diphenhydrAMINE  50 mg Intravenous Once   enoxaparin (LOVENOX) injection  40 mg Subcutaneous Q24H   gabapentin  600 mg Oral TID   ketorolac  15 mg Intravenous Once   mometasone-formoterol  2 puff Inhalation BID   omeprazole  20 mg Oral Daily   prochlorperazine  10 mg Intravenous Once   Continuous Infusions:  sodium chloride 100 mL/hr at 09/29/21 0853   acyclovir 720 mg (09/29/21 0557)   cefTRIAXone (ROCEPHIN)  IV 2 g (09/29/21 1009)   vancomycin 1,250 mg (09/29/21 1127)     LOS: 2 days   Shelly Coss, MD [facility] P6/07/2021, 11:35 AM

## 2021-09-30 ENCOUNTER — Telehealth (HOSPITAL_COMMUNITY): Payer: Self-pay | Admitting: *Deleted

## 2021-09-30 DIAGNOSIS — G039 Meningitis, unspecified: Secondary | ICD-10-CM | POA: Diagnosis not present

## 2021-09-30 LAB — ROCKY MTN SPOTTED FVR ABS PNL(IGG+IGM)
RMSF IgG: NEGATIVE
RMSF IgM: 0.44 index (ref 0.00–0.89)

## 2021-09-30 LAB — BASIC METABOLIC PANEL
Anion gap: 5 (ref 5–15)
BUN: <5 mg/dL — ABNORMAL LOW (ref 6–20)
CO2: 26 mmol/L (ref 22–32)
Calcium: 8.1 mg/dL — ABNORMAL LOW (ref 8.9–10.3)
Chloride: 110 mmol/L (ref 98–111)
Creatinine, Ser: 0.65 mg/dL (ref 0.44–1.00)
GFR, Estimated: >60 mL/min (ref >60)
Glucose, Bld: 98 mg/dL (ref 70–99)
Potassium: 3.4 mmol/L — ABNORMAL LOW (ref 3.5–5.1)
Sodium: 141 mmol/L (ref 135–145)

## 2021-09-30 LAB — CSF CULTURE W GRAM STAIN: Culture: NO GROWTH

## 2021-09-30 MED ORDER — ADULT MULTIVITAMIN W/MINERALS CH
1.0000 | ORAL_TABLET | Freq: Two times a day (BID) | ORAL | Status: DC
  Filled 2021-09-30 (×2): qty 1

## 2021-09-30 MED ORDER — HYDROMORPHONE HCL 1 MG/ML IJ SOLN
0.5000 mg | INTRAMUSCULAR | Status: DC | PRN
  Administered 2021-09-30 – 2021-10-01 (×5): 0.5 mg via INTRAVENOUS
  Filled 2021-09-30 (×5): qty 1

## 2021-09-30 MED ORDER — POTASSIUM CHLORIDE CRYS ER 20 MEQ PO TBCR
40.0000 meq | EXTENDED_RELEASE_TABLET | Freq: Once | ORAL | Status: AC
  Administered 2021-09-30: 40 meq via ORAL
  Filled 2021-09-30: qty 2

## 2021-09-30 MED ORDER — SODIUM CHLORIDE 0.9 % IV SOLN: INTRAVENOUS | Status: DC

## 2021-09-30 NOTE — Telephone Encounter (Signed)
Pt LVM requesting refills on meds. Pt is currently inpt for meningitis. And suspected tick bite. Writer returned call and spoke with pt spouse Katelyn [name] advising that they need not worry about refilling scripts right now. Pt has an upcoming appointment with Dr.Arfeen upcoming on 10/07/21. Hospital is providing her with her psych meds and we can talk about what she needs after she is d/c and settles in. Katelyn [name] agreed and will share this with pt.

## 2021-09-30 NOTE — Progress Notes (Signed)
PROGRESS NOTE  AHNESTY FINFROCK  PXT:[id number] DOB: 20-Dec-1975 DOA: 09/27/2021 PCP: Kathyrn Drown, MD   Brief Narrative: Patient is a 46 year old female with history of asthma, GERD, anxiety, chronic pain syndrome who presents to the emergency department at [facility] hospital with fever, chest pain, neck pain, headache, body aches, vomiting, rash on upper extremities.  On presentation she was febrile, tachypneic, hypertensive.  Lab work showed leukocytosis.  Due to presentation with fever, headache, lumbar puncture was done, CSF analysis showed elevated WBC with segmented neutrophils, elevated protein.  Patient was started on empiric antibiotics.  ID consulted.  Overall situation looking better, suspected viral meningitis.  Possible discharge to home tomorrow   Assessment & Plan:  Principal Problem:   Meningitis Active Problems:   Nausea & vomiting   Coccydynia   Chronic myofascial pain   Small fiber neuropathy   Epigastric pain   GERD (gastroesophageal reflux disease)   Rash and nonspecific skin eruption   Hypocalcemia   Chronic pain syndrome   Sepsis ([place])  Meningitis: Presented with several days history of fever, neck pain, headache, nausea, vomiting.  Blood work showed leukocytosis.  CSF analysis showed elevated WBC with segmented neutrophils, elevated protein.  Gram stain showed GPC in pairs.  No report of encephalopathy.  Questionable history of tick bite.  Patient was started on empiric antibiotics.  ID consulted.  Follow-up final CSF culture( no growth so far), RMSF workup pending.  She was on vancomycin, ceftriaxone, acyclovir.  Now increasing suspicion for viral meningitis so vancomycin and ceftriaxone discontinued.  Patient is symptomatically better.  Nausea/vomiting/headache: Continue symptomatic treatment with Zofran.  On gentle IV fluids.  The symptoms have been  better.  Headache is better  Upper extremity rash: ID following.  Continue current antibiotics.  Rash have  faded  GERD/epigastric pain: Continue Protonix  Chronic pain syndrome: History of resolved fiber neuropathy, coccydynia, chronic myofascial pain.  On narcotics at home.  Continue supportive care, pain management  Hypokalemia: Supplemented with potassium  History of asthma: Not in exacerbation.  Continue Ventolin, Dulera  Obesity: BMI 33.2    DVT prophylaxis:enoxaparin (LOVENOX) injection 40 mg Start: 09/27/21 1100 SCDs Start: 09/27/21 1015     Code Status: Full Code  Family Communication: Husband at bedside  Patient status:Inpatient  Patient is from :Home  Anticipated discharge OE:VOJJ  Estimated DC date: Likely tomorrow  Consultants: ID  Procedures: Lumbar puncture  Antimicrobials:  Anti-infectives (From admission, onward)    Start     Dose/Rate Route Frequency Ordered Stop   09/28/21 2200  cefTRIAXone (ROCEPHIN) 2 g in sodium chloride 0.9 % 100 mL IVPB  Status:  Discontinued        2 g 200 mL/hr over 30 Minutes Intravenous Every 12 hours 09/27/21 0716 09/30/21 1012   09/27/21 1800  vancomycin (VANCOREADY) IVPB 1250 mg/250 mL  Status:  Discontinued        1,250 mg 166.7 mL/hr over 90 Minutes Intravenous Every 12 hours 09/27/21 0612 09/30/21 1012   09/27/21 1400  acyclovir (ZOVIRAX) 720 mg in dextrose 5 % 250 mL IVPB        10 mg/kg  71.8 kg (Adjusted) 264.4 mL/hr over 60 Minutes Intravenous Every 8 hours 09/27/21 1109     09/27/21 0600  vancomycin (VANCOREADY) IVPB 1500 mg/300 mL        1,500 mg 150 mL/hr over 120 Minutes Intravenous  Once 09/27/21 0546 09/27/21 1205   09/27/21 0545  doxycycline (VIBRAMYCIN) 100 mg in sodium chloride 0.9 %  250 mL IVPB  Status:  Discontinued        100 mg 125 mL/hr over 120 Minutes Intravenous Every 12 hours 09/27/21 0530 09/27/21 1126   09/27/21 0545  cefTRIAXone (ROCEPHIN) 2 g in sodium chloride 0.9 % 100 mL IVPB        2 g 200 mL/hr over 30 Minutes Intravenous  Once 09/27/21 0537 09/27/21 0910       Subjective: Patient  seen and examined at the bedside this morning.  She looks more comfortable today.  Headache, nausea or vomiting better today.  Eager to go home.  Objective: Vitals:   09/29/21 1655 09/29/21 2109 09/30/21 0601 09/30/21 0908  BP: 108/68 130/81 117/70 113/66  Pulse: 66 79 70 71  Resp: '18 15 17 16  '$ Temp: 98.6 F (37 C) 98.3 F (36.8 C) 98.6 F (37 C) 98.4 F (36.9 C)  TempSrc:  Oral Oral   SpO2: 100% 95% 96% 98%  Weight:      Height:        Intake/Output Summary (Last 24 hours) at 09/30/2021 1139 Last data filed at 09/30/2021 0731 Gross per 24 hour  Intake 4100.7 ml  Output 0 ml  Net 4100.7 ml   Filed Weights   09/27/21 0026  Weight: 92.1 kg    Examination:  General exam: Overall comfortable, not in distress,obese HEENT: PERRL Respiratory system:  no wheezes or crackles  Cardiovascular system: S1 & S2 heard, RRR.  Gastrointestinal system: Abdomen is nondistended, soft and nontender. Central nervous system: Alert and oriented Extremities: No edema, no clubbing ,no cyanosis Skin: No rashes, no ulcers,no icterus     Data Reviewed: I have personally reviewed following labs and imaging studies  CBC: Recent Labs  Lab 09/25/21 1708 09/27/21 0132 09/28/21 0224  WBC 13.8* 11.8* 8.6  NEUTROABS 11.9* 10.0*  --   HGB 15.8* 15.4* 12.4  HCT 47.0* 46.6* 36.5  MCV 95.9 96.9 95.8  PLT 377 273 462   Basic Metabolic Panel: Recent Labs  Lab 09/25/21 1708 09/27/21 0132 09/28/21 0224 09/30/21 0101  NA 138 138 137 141  K 3.7 4.6 3.2* 3.4*  CL 106 105 106 110  CO2 '26 22 25 26  '$ GLUCOSE 109* 103* 112* 98  BUN '12 13 6 '$ <5*  CREATININE 0.73 0.80 0.77 0.65  CALCIUM 9.2 8.4* 8.3* 8.1*  MG  --  2.0  --   --   PHOS  --  3.1  --   --      Recent Results (from the past 240 hour(s))  Novel Coronavirus, NAA ([facility])     Status: None   Collection Time: 09/25/21 12:00 AM   Specimen: Nasopharyngeal(NP) swabs in vial transport medium   Nasopharynge  Previous  Result Value Ref Range  Status   SARS-CoV-2, NAA Not Detected Not Detected Final    Comment: This nucleic acid amplification test was developed and its performance characteristics determined by [employer], [employer] and [employer]. Nucleic acid amplification tests include RT-PCR and TMA. This test has not been FDA cleared or approved. This test has been authorized by FDA under an Emergency Use Authorization (EUA). This test is only authorized for the duration of time the declaration that circumstances exist justifying the authorization of the emergency use of in vitro diagnostic tests for detection of SARS-CoV-2 virus and/or diagnosis of COVID-19 infection under section 564(b)(1) of the Act, 21 U.S.C. 703JKK-9(F) (1), unless the authorization is terminated or revoked sooner. When diagnostic testing is negative, the possibility of a false negative result  should be considered in the context of a patient's recent exposures and the presence of clinical signs and symptoms consistent with COVID-19. An individual without symptoms of COVID-19 and who is not shedding SARS-CoV-2 virus wo uld expect to have a negative (not detected) result in this assay.   Urine Culture     Status: None   Collection Time: 09/25/21 12:00 AM   Specimen: Urine   Urine  Result Value Ref Range Status   Urine Culture, Routine Final report  Final   Organism ID, Bacteria Comment  Final    Comment: Mixed urogenital flora 10,000-25,000 colony forming units per mL   Resp Panel by RT-PCR (Flu A&B, Covid) Urine, Clean Catch     Status: None   Collection Time: 09/27/21  2:33 AM   Specimen: Urine, Clean Catch; Nasal Swab  Result Value Ref Range Status   SARS Coronavirus 2 by RT PCR NEGATIVE NEGATIVE Final    Comment: (NOTE) SARS-CoV-2 target nucleic acids are NOT DETECTED.  The SARS-CoV-2 RNA is generally detectable in upper respiratory specimens during the acute phase of infection. The lowest concentration of SARS-CoV-2 viral copies this assay can detect  is 138 copies/mL. A negative result does not preclude SARS-Cov-2 infection and should not be used as the sole basis for treatment or other patient management decisions. A negative result may occur with  improper specimen collection/handling, submission of specimen other than nasopharyngeal swab, presence of viral mutation(s) within the areas targeted by this assay, and inadequate number of viral copies(<138 copies/mL). A negative result must be combined with clinical observations, patient history, and epidemiological information. The expected result is Negative.  Fact Sheet for Patients:  EntrepreneurPulse.com.au  Fact Sheet for Healthcare Providers:  IncredibleEmployment.be  This test is no t yet approved or cleared by the [place] FDA and  has been authorized for detection and/or diagnosis of SARS-CoV-2 by FDA under an Emergency Use Authorization (EUA). This EUA will remain  in effect (meaning this test can be used) for the duration of the COVID-19 declaration under Section 564(b)(1) of the Act, 21 U.S.C.section 360bbb-3(b)(1), unless the authorization is terminated  or revoked sooner.       Influenza A by PCR NEGATIVE NEGATIVE Final   Influenza B by PCR NEGATIVE NEGATIVE Final    Comment: (NOTE) The Xpert Xpress SARS-CoV-2/FLU/RSV plus assay is intended as an aid in the diagnosis of influenza from Nasopharyngeal swab specimens and should not be used as a sole basis for treatment. Nasal washings and aspirates are unacceptable for Xpert Xpress SARS-CoV-2/FLU/RSV testing.  Fact Sheet for Patients: EntrepreneurPulse.com.au  Fact Sheet for Healthcare Providers: IncredibleEmployment.be  This test is not yet approved or cleared by the [place] FDA and has been authorized for detection and/or diagnosis of SARS-CoV-2 by FDA under an Emergency Use Authorization (EUA). This EUA will remain in effect  (meaning this test can be used) for the duration of the COVID-19 declaration under Section 564(b)(1) of the Act, 21 U.S.C. section 360bbb-3(b)(1), unless the authorization is terminated or revoked.  Performed at [facility], [address] [place], [place] [address]   CSF culture     Status: None   Collection Time: 09/27/21  3:55 AM   Specimen: CSF; Cerebrospinal Fluid  Result Value Ref Range Status   Specimen Description   Final    CSF Performed at [facility], [address], [place], [place] [address]    Special Requests   Final    NONE Performed at [facility],  [address], [place], [place] [address]    Gram Stain   Final    WBC PRESENT, PREDOMINANTLY PMN NO ORGANISMS SEEN CYTOSPIN SMEAR CRITICAL RESULT CALLED TO, READ BACK BY AND VERIFIED WITH: PHARMD E.MARTIN ON 09/30/21 @ 1008 BY TS CORRECTED RESULTS PREVIOUSLY REPORTED AS: GRAM POSITIVE COCCI    Culture   Final    NO GROWTH 3 DAYS Performed at [facility], [place] [address] [place], [place] [address]    Report Status 09/30/2021 FINAL  Final     Radiology Studies: No results found.  Scheduled Meds:  buPROPion  150 mg Oral Daily   calcium carbonate  1 tablet Oral Q breakfast   cholecalciferol  1,000 Units Oral Daily   diphenhydrAMINE  50 mg Intravenous Once   enoxaparin (LOVENOX) injection  40 mg Subcutaneous Q24H   gabapentin  600 mg Oral TID   ketorolac  15 mg Intravenous Once   mometasone-formoterol  2 puff Inhalation BID   multivitamin with minerals  1 tablet Oral BID   omeprazole  20 mg Oral Daily   prochlorperazine  10 mg Intravenous Once   Continuous Infusions:  sodium chloride 50 mL/hr at 09/30/21 1039   acyclovir 720 mg (09/30/21 0617)     LOS: 3 days   Shelly Coss, MD [facility] P6/08/2021, 11:39 AM

## 2021-09-30 NOTE — Progress Notes (Signed)
Nutrition Follow-up  DOCUMENTATION CODES:   Not applicable  INTERVENTION:   Liberalize diet to Regular  Continue Bariatric MVI, Calcium and Vit D supplements at home; RD ordered standard MVI with Minerals BID while in patient  Encouraged pt to continue to follow-up with outpatient RD, Lynden Ang, weekly and to continue to follow-up with bariatric surgeon annually   NUTRITION DIAGNOSIS:   Inadequate oral intake related to acute illness (presumed meningitis) as evidenced by per patient/family report (tolerating only ice chips).  Being addressed  GOAL:   Patient will meet greater than or equal to 90% of their needs  Progressing  MONITOR:   PO intake, Supplement acceptance, Labs, Weight trends  REASON FOR ASSESSMENT:   Malnutrition Screening Tool    ASSESSMENT:   46 yo female admitted with N/V, fever and neck pain with viral meningitis. PMH includes roux-en-y bariatric surgery in 2015, eating disorder/anorexia-followed by Lynden Ang RD as outpatient, chronic pain with chronic myofascial pain, coccydynia and resolved fiber neuropathy, hx of severe recurrent depression with psychotic symptoms, B12 deficiency, iron def anemia  Pt lying down on visit today, reports some nausea persists. Pt tolerated some baked plain chips and some ice cream. Encouraged pt to still with bland foods that are easy to digest for now. Recorded po intake 50-100% of meals trays.   Pt reports she continues to see Lynden Ang RD as an outpatient on weekly basis for her eating disorder/anorexia. Encouraged pt to continue with this.   Pt also reports she sees her bariatric surgeon, Dr. Hassell Done, every year. Encouraged pt to continue to do this as well.   Pt takes calcium, Vitamin D and bariatric MVI at home  Pt reports her mobility is very limited due to her chronic pain. She spends most of her time in chair or bed  Pt reports she weighed over 300 pounds at time of her bariatric surgery and dropped  down as low as 110 pounds with her eating disorder. Pt reports she feels better with "a little bit of weight on." In fact, pt reports at current weight she feels the best.   Vitamin Panel (09/10/21):  B12: 1823 (H), Methylmalonic acid: 156  (wdl)  Iron: 82 (wdl) Folate: >40 (wdl)  Labs: potassium 3.4 (L) Meds: calcium carbonate, Vitamin D, potassium chloride, NS at 50 ml/hr  Diet Order:   Diet Order             Diet Heart Room service appropriate? Yes; Fluid consistency: Thin  Diet effective now                   EDUCATION NEEDS:   Not appropriate for education at this time  Skin:  Skin Assessment: Reviewed RN Assessment  Last BM:  5/31  Height:   Ht Readings from Last 1 Encounters:  09/27/21 5' 5.5" (1.664 m)    Weight:   Wt Readings from Last 1 Encounters:  09/27/21 92.1 kg    BMI:  Body mass index is 33.27 kg/m.  Estimated Nutritional Needs:   Kcal:  1800-1900  Protein:  83-90 gr  Fluid:  1.8-1.9 liters daily  Kerman Passey MS, RDN, LDN, CNSC Registered Dietitian III Clinical Nutrition RD Pager and On-Call Pager Number Located in [place]

## 2021-09-30 NOTE — Plan of Care (Signed)

## 2021-09-30 NOTE — Progress Notes (Signed)
[place] for Infectious Disease  Date of Admission:  09/27/2021   Total days of inpatient antibiotics 2  Principal Problem:   Meningitis Active Problems:   Nausea & vomiting   Coccydynia   Chronic myofascial pain   Small fiber neuropathy   Epigastric pain   GERD (gastroesophageal reflux disease)   Rash and nonspecific skin eruption   Hypocalcemia   Chronic pain syndrome   Sepsis [facility])          Assessment: 46 year old female admitted on 6/2 for nausea/headache/ffever x3 days.  Found to have erythematous papular rash on trunk, periorbital region.  She underwent LP showed pleocytosis.  #Meningitis 2/2 likely viral etiology #Fever and leukocytosis-resolving -LP: CSF 33 wb(50% seg, 15% lymph, no eos, 15% mono);c, 8rbc, glc 59, PTN 64, VDRL, arbovirus pending -[place] regularly, but has not  noted any on herself -HIV negative -Agree that clinical picture is most c/w viral etiology. CSF Cx negative(no organisms on gram stain). Lab was not able to run HSV/VZV/ME panel due to insuffiencey specimen Recommendations: - D/C vancomycin, ceftriaxone - Continue acyclovir. Pt still has HA(improved). Will continue to treat with IV therapy. Once symptoms improve transition to PO. Plan to treat empirically for viral meningitis - Follow Arbovirus IgG,  VDRL, RMSF(serum)    Microbiology:   Antibiotics: Acyclovir 6/2-p CTX 6/2 Doxy 6/1 Vancomycin 6/2-p Cultures:  Other LP: CSF 33 wb(50% seg, 15% lymph, no eos, 15% mono);c, 8rbc, glc 59, PTN 64, VDRL, arbovirus pending CSF Cx NG, stain shows no organisms  SUBJECTIVE: Resting in bed. No new complaints. HA still present but improved.  Interval: Afebrile and no leukocytosis Review of Systems: Review of Systems  All other systems reviewed and are negative.   Scheduled Meds:  buPROPion  150 mg Oral Daily   calcium carbonate  1 tablet Oral Q breakfast    cholecalciferol  1,000 Units Oral Daily   diphenhydrAMINE  50 mg Intravenous Once   enoxaparin (LOVENOX) injection  40 mg Subcutaneous Q24H   gabapentin  600 mg Oral TID   ketorolac  15 mg Intravenous Once   mometasone-formoterol  2 puff Inhalation BID   multivitamin with minerals  1 tablet Oral BID   omeprazole  20 mg Oral Daily   prochlorperazine  10 mg Intravenous Once   Continuous Infusions:  sodium chloride 50 mL/hr at 09/30/21 1039   acyclovir 720 mg (09/30/21 1324)   PRN Meds:.acetaminophen, albuterol, HYDROmorphone (DILAUDID) injection, ondansetron (ZOFRAN) IV, oxyCODONE, temazepam, tiZANidine Allergies  Allergen Reactions   Ciprofloxacin Other (See Comments)    Nerve pain     Lyrica [Pregabalin] Anaphylaxis   Duloxetine Hcl     Make her more depressed, having nausea, vomiting, headache and threw up.    Feraheme [Ferumoxytol] Hives   Injectafer [Ferric Carboxymaltose] Other (See Comments)    Fevers   Cyanocobalamin     Injectable only - numbness, tingling, muscle cramping, and heart palpations    Penicillins     Has patient had a PCN reaction causing immediate rash, facial/tongue/throat swelling, SOB or lightheadedness with hypotension: Yes Has patient had a PCN reaction causing severe rash involving mucus membranes or skin necrosis: No Has patient had a PCN reaction that required hospitalization No Has patient had a PCN reaction occurring within the last 10 years: No If all of the above answers are "NO", then may proceed with Cephalosporin use.  REACTION: Rash    OBJECTIVE: Vitals:   09/29/21 1655 09/29/21 2109 09/30/21 0601 09/30/21 0908  BP: 108/68 130/81 117/70 113/66  Pulse: 66 79 70 71  Resp: '18 15 17 16  '$ Temp: 98.6 F (37 C) 98.3 F (36.8 C) 98.6 F (37 C) 98.4 F (36.9 C)  TempSrc:  Oral Oral   SpO2: 100% 95% 96% 98%  Weight:      Height:       Body mass index is 33.27 kg/m.  Physical Exam Constitutional:      Appearance: Normal  appearance.  HENT:     Head: Normocephalic and atraumatic.     Right Ear: Tympanic membrane normal.     Left Ear: Tympanic membrane normal.     Nose: Nose normal.     Mouth/Throat:     Mouth: Mucous membranes are moist.  Eyes:     Extraocular Movements: Extraocular movements intact.     Conjunctiva/sclera: Conjunctivae normal.     Pupils: Pupils are equal, round, and reactive to light.  Cardiovascular:     Rate and Rhythm: Normal rate and regular rhythm.     Heart sounds: No murmur heard.   No friction rub. No gallop.  Pulmonary:     Effort: Pulmonary effort is normal.     Breath sounds: Normal breath sounds.  Abdominal:     General: Abdomen is flat.     Palpations: Abdomen is soft.  Musculoskeletal:        General: Normal range of motion.  Skin:    General: Skin is warm and dry.     Comments: Petechial rash on b/l UE. Imaging in chart.  Periorbital rash R>L  Neurological:     General: No focal deficit present.     Mental Status: She is alert and oriented to person, place, and time.  Psychiatric:        Mood and Affect: Mood normal.      Lab Results Lab Results  Component Value Date   WBC 8.6 09/28/2021   HGB 12.4 09/28/2021   HCT 36.5 09/28/2021   MCV 95.8 09/28/2021   PLT 259 09/28/2021    Lab Results  Component Value Date   CREATININE 0.65 09/30/2021   BUN <5 (L) 09/30/2021   NA 141 09/30/2021   K 3.4 (L) 09/30/2021   CL 110 09/30/2021   CO2 26 09/30/2021    Lab Results  Component Value Date   ALT 15 09/28/2021   AST 15 09/28/2021   ALKPHOS 79 09/28/2021   BILITOT 0.4 09/28/2021        Laurice Record, [place] [facility]ious [facility] 09/30/2021, 3:24 PM

## 2021-10-01 ENCOUNTER — Telehealth (HOSPITAL_COMMUNITY): Payer: PPO | Admitting: Psychiatry

## 2021-10-01 ENCOUNTER — Telehealth: Payer: Self-pay

## 2021-10-01 ENCOUNTER — Other Ambulatory Visit (HOSPITAL_COMMUNITY): Payer: Self-pay

## 2021-10-01 DIAGNOSIS — G039 Meningitis, unspecified: Secondary | ICD-10-CM | POA: Diagnosis not present

## 2021-10-01 LAB — BASIC METABOLIC PANEL
Anion gap: 7 (ref 5–15)
BUN: <5 mg/dL — ABNORMAL LOW (ref 6–20)
CO2: 29 mmol/L (ref 22–32)
Calcium: 8.3 mg/dL — ABNORMAL LOW (ref 8.9–10.3)
Chloride: 109 mmol/L (ref 98–111)
Creatinine, Ser: 0.61 mg/dL (ref 0.44–1.00)
GFR, Estimated: >60 mL/min (ref >60)
Glucose, Bld: 100 mg/dL — ABNORMAL HIGH (ref 70–99)
Potassium: 3.6 mmol/L (ref 3.5–5.1)
Sodium: 145 mmol/L (ref 135–145)

## 2021-10-01 LAB — VDRL, CSF: VDRL Quant, CSF: NONREACTIVE

## 2021-10-01 MED ORDER — VALACYCLOVIR HCL 1 G PO TABS
1000.0000 mg | ORAL_TABLET | Freq: Three times a day (TID) | ORAL | 0 refills | Status: AC
  Filled 2021-10-01: qty 31, 11d supply, fill #0

## 2021-10-01 MED ORDER — VALACYCLOVIR HCL 500 MG PO TABS: 1000.0000 mg | ORAL_TABLET | Freq: Three times a day (TID) | ORAL | Status: DC

## 2021-10-01 MED ORDER — BUTALBITAL-APAP-CAFFEINE 50-325-40 MG PO TABS
1.0000 | ORAL_TABLET | Freq: Four times a day (QID) | ORAL | Status: DC | PRN
Start: 2021-10-01 — End: 2021-10-01
  Filled 2021-10-01: qty 1

## 2021-10-01 MED ORDER — BUTALBITAL-APAP-CAFFEINE 50-325-40 MG PO TABS
1.0000 | ORAL_TABLET | Freq: Four times a day (QID) | ORAL | 0 refills | Status: DC | PRN
  Filled 2021-10-01: qty 20, 5d supply, fill #0

## 2021-10-01 NOTE — Telephone Encounter (Signed)
Transition Care Management Follow-up Telephone Call Date of discharge and from where: 10/01/2021 [name]  How have you been since you were released from the hospital? Pt states, " I am better but not 100%." Any questions or concerns? No  Items Reviewed: Did the pt receive and understand the discharge instructions provided? Yes  Medications obtained and verified? Yes  Other? No  Any new allergies since your discharge? No  Dietary orders reviewed? Yes Do you have support at home? Yes   Home Care and Equipment/Supplies: Were home health services ordered? no If so, what is the name of the agency? N/A  Has the agency set up a time to come to the patient's home? not applicable Were any new equipment or medical supplies ordered?  No What is the name of the medical supply agency? N/A Were you able to get the supplies/equipment? not applicable Do you have any questions related to the use of the equipment or supplies? No  Functional Questionnaire: (I = Independent and D = Dependent) ADLs: I  Bathing/Dressing- I  Meal Prep- I  Eating- I  Maintaining continence- I  Transferring/Ambulation- I  Managing Meds- I  Follow up appointments reviewed:  PCP Hospital f/u appt confirmed? Yes  Scheduled to see DR. Wolfgang Phoenix on 10/02/2021 @ 11:20. [facility] f/u appt confirmed?  N/A   Are transportation arrangements needed? No  If their condition worsens, is the pt aware to call PCP or go to the Emergency Dept.? Yes Was the patient provided with contact information for the PCP's office or ED? Yes Was to pt encouraged to call back with questions or concerns? Yes

## 2021-10-01 NOTE — Progress Notes (Addendum)
Patient discharge teaching given, including activity, diet, follow-up appoints, and medications. Patient verbalized understanding of all discharge instructions. IV access was d/c'd. Vitals are stable. Skin is intact except as charted in most recent assessments. Pt to be escorted out by NT, to be driven home by family.  Meds delivered by pharmacy for home.

## 2021-10-01 NOTE — Progress Notes (Signed)
[place] for Infectious Disease  Date of Admission:  09/27/2021   Total days of inpatient antibiotics 2  Principal Problem:   Meningitis Active Problems:   Nausea & vomiting   Coccydynia   Chronic myofascial pain   Small fiber neuropathy   Epigastric pain   GERD (gastroesophageal reflux disease)   Rash and nonspecific skin eruption   Hypocalcemia   Chronic pain syndrome   Sepsis [facility])          Assessment: 46 year old female admitted on 6/2 for nausea/headache/ffever x3 days.  Found to have erythematous papular rash on trunk, periorbital region.  She underwent LP showed pleocytosis.  #Meningitis 2/2 likely viral etiology #Fever and leukocytosis-resolving -LP: CSF 33 wb(50% seg, 15% lymph, no eos, 15% mono);c, 8rbc, glc 59, PTN 64, VDRL, arbovirus pending -[place] regularly, but has not  noted any on herself -HIV negative/RMSF(serum) negative -Agree that clinical picture is most c/w viral etiology. CSF Cx negative(no organisms on gram stain). Lab was not able to run HSV/VZV/ME panel due to insuffiencey specimen. HA is improving Recommendations: - D/C Acyclovir - Start Valtrex 1gm tid to complete 2 weeks of anti--viral therapy EOT 6/15 - Follow Arbovirus IgG,  VDRL - Follow-up with ID on 6/19    Microbiology:   Antibiotics: Acyclovir 6/2-p CTX 6/2 Doxy 6/1 Vancomycin 6/2-p Cultures:  Other LP: CSF 33 wb(50% seg, 15% lymph, no eos, 15% mono);c, 8rbc, glc 59, PTN 64, VDRL, arbovirus pending CSF Cx NG, stain shows no organisms  SUBJECTIVE: Resting in bed. HA is improving Interval: Afebrile and no leukocytosis Review of Systems: Review of Systems  All other systems reviewed and are negative.   Scheduled Meds:  buPROPion  150 mg Oral Daily   calcium carbonate  1 tablet Oral Q breakfast   cholecalciferol  1,000 Units Oral Daily   diphenhydrAMINE  50 mg Intravenous Once    enoxaparin (LOVENOX) injection  40 mg Subcutaneous Q24H   gabapentin  600 mg Oral TID   ketorolac  15 mg Intravenous Once   lidocaine  1 patch Transdermal Q24H   mometasone-formoterol  2 puff Inhalation BID   multivitamin with minerals  1 tablet Oral BID   omeprazole  20 mg Oral Daily   prochlorperazine  10 mg Intravenous Once   valACYclovir  1,000 mg Oral TID   Continuous Infusions:  sodium chloride 50 mL/hr at 10/01/21 0603   PRN Meds:.acetaminophen, albuterol, butalbital-acetaminophen-caffeine, HYDROmorphone (DILAUDID) injection, ondansetron (ZOFRAN) IV, oxyCODONE, temazepam, tiZANidine Allergies  Allergen Reactions   Ciprofloxacin Other (See Comments)    Nerve pain     Lyrica [Pregabalin] Anaphylaxis   Duloxetine Hcl     Make her more depressed, having nausea, vomiting, headache and threw up.    Feraheme [Ferumoxytol] Hives   Injectafer [Ferric Carboxymaltose] Other (See Comments)    Fevers   Cyanocobalamin     Injectable only - numbness, tingling, muscle cramping, and heart palpations    Penicillins     Has patient had a PCN reaction causing immediate rash, facial/tongue/throat swelling, SOB or lightheadedness with hypotension: Yes Has patient had a PCN reaction causing severe rash involving mucus membranes or skin necrosis: No Has patient had a PCN reaction that required hospitalization No Has patient had a PCN reaction occurring within the last 10 years: No If all of the above answers are "NO", then may proceed with Cephalosporin use.  REACTION: Rash    OBJECTIVE: Vitals:   09/30/21 1644 09/30/21 2109 10/01/21 0525 10/01/21 0941  BP: (!) 101/56 (!) 110/55 108/64 123/80  Pulse: 76 65 67 62  Resp: '19 18 18 18  '$ Temp: 98.6 F (37 C) 98.4 F (36.9 C) 98.4 F (36.9 C) 98.9 F (37.2 C)  TempSrc: Oral Oral Oral   SpO2: 96% 100% 97% 95%  Weight:      Height:       Body mass index is 33.27 kg/m.  Physical Exam Constitutional:      Appearance: Normal  appearance.  HENT:     Head: Normocephalic and atraumatic.     Right Ear: Tympanic membrane normal.     Left Ear: Tympanic membrane normal.     Nose: Nose normal.     Mouth/Throat:     Mouth: Mucous membranes are moist.  Eyes:     Extraocular Movements: Extraocular movements intact.     Conjunctiva/sclera: Conjunctivae normal.     Pupils: Pupils are equal, round, and reactive to light.  Cardiovascular:     Rate and Rhythm: Normal rate and regular rhythm.     Heart sounds: No murmur heard.   No friction rub. No gallop.  Pulmonary:     Effort: Pulmonary effort is normal.     Breath sounds: Normal breath sounds.  Abdominal:     General: Abdomen is flat.     Palpations: Abdomen is soft.  Musculoskeletal:        General: Normal range of motion.  Skin:    General: Skin is warm and dry.     Comments: Petechial rash on b/l UE. Imaging in chart.  Periorbital rash R>L  Neurological:     General: No focal deficit present.     Mental Status: She is alert and oriented to person, place, and time.  Psychiatric:        Mood and Affect: Mood normal.      Lab Results Lab Results  Component Value Date   WBC 8.6 09/28/2021   HGB 12.4 09/28/2021   HCT 36.5 09/28/2021   MCV 95.8 09/28/2021   PLT 259 09/28/2021    Lab Results  Component Value Date   CREATININE 0.61 10/01/2021   BUN <5 (L) 10/01/2021   NA 145 10/01/2021   K 3.6 10/01/2021   CL 109 10/01/2021   CO2 29 10/01/2021    Lab Results  Component Value Date   ALT 15 09/28/2021   AST 15 09/28/2021   ALKPHOS 79 09/28/2021   BILITOT 0.4 09/28/2021        Laurice Record, [place] [facility] 10/01/2021, 12:20 PM

## 2021-10-01 NOTE — Discharge Summary (Signed)
Physician Discharge Summary  Katelyn [name] PFX:[id number] DOB: October 05, 1975 DOA: 09/27/2021  PCP: Kathyrn Drown, MD  Admit date: 09/27/2021 Discharge date: 10/01/2021  Admitted From: Home Disposition:  Home  Discharge Condition:Stable CODE STATUS:FULL Diet recommendation: Heart Healthy   Brief/Interim Summary:  Patient is a 46 year old female with history of asthma, GERD, anxiety, chronic pain syndrome who presents to the emergency department at [facility] hospital with fever, chest pain, neck pain, headache, body aches, vomiting, rash on upper extremities.  On presentation she was febrile, tachypneic, hypertensive.  Lab work showed leukocytosis.  Due to presentation with fever, headache, lumbar puncture was done, CSF analysis showed elevated WBC with segmented neutrophils, elevated protein.  Patient was started on empiric antibiotics.  ID consulted.  Overall situation looking better, suspected viral meningitis.  She is symptomatically better.  ID cleared for discharge.    Following problems were addressed during her hospitalization:   Meningitis: Presented with several days history of fever, neck pain, headache, nausea, vomiting.  Blood work showed leukocytosis.  CSF analysis showed elevated WBC with segmented neutrophils, elevated protein.  Gram stain showed GPC in pairs.  No report of encephalopathy.  Questionable history of tick bite.  Patient was started on empiric antibiotics.  ID consulted.  Follow-up final CSF culture( no growth so far), RMSF workup negative.  She was on vancomycin, ceftriaxone, acyclovir.  Suspicion for viral meningitis so vancomycin and ceftriaxone discontinued.  Patient is symptomatically better.  Acyclovir changed to Valtrex.   Nausea/vomiting/headache: Symptoms have been better.  Continue Fioricet for headache.  She has history of migraines   upper extremity rash:  Rash have faded   GERD/epigastric pain: Continue Protonix   Chronic pain syndrome: History of  resolved fiber neuropathy, coccydynia, chronic myofascial pain.  On narcotics at home.  Continue supportive care, pain management   Hypokalemia: Supplemented with potassium   History of asthma: Not in exacerbation.  Continue Ventolin, Dulera   Obesity: BMI 33.2   Discharge Diagnoses:  Principal Problem:   Meningitis Active Problems:   Nausea & vomiting   Coccydynia   Chronic myofascial pain   Small fiber neuropathy   Epigastric pain   GERD (gastroesophageal reflux disease)   Rash and nonspecific skin eruption   Hypocalcemia   Chronic pain syndrome   Sepsis [facility])    Discharge Instructions  Discharge Instructions     Diet general   Complete by: As directed    Discharge instructions   Complete by: As directed    1)Please take prescribed medications as instructed 2)Follow up with your PCP in a week   Increase activity slowly   Complete by: As directed       Allergies as of 10/01/2021       Reactions   Ciprofloxacin Other (See Comments)   Nerve pain    Lyrica [pregabalin] Anaphylaxis   Duloxetine Hcl    Make her more depressed, having nausea, vomiting, headache and threw up.   Feraheme [ferumoxytol] Hives   Injectafer [ferric Carboxymaltose] Other (See Comments)   Fevers   Cyanocobalamin    Injectable only - numbness, tingling, muscle cramping, and heart palpations    Penicillins    Has patient had a PCN reaction causing immediate rash, facial/tongue/throat swelling, SOB or lightheadedness with hypotension: Yes Has patient had a PCN reaction causing severe rash involving mucus membranes or skin necrosis: No Has patient had a PCN reaction that required hospitalization No Has patient had a PCN reaction occurring within the last 10 years: No If  all of the above answers are "NO", then may proceed with Cephalosporin use. REACTION: Rash        Medication List     STOP taking these medications    enoxaparin 40 MG/0.4ML injection Commonly known as: LOVENOX    fluconazole 150 MG tablet Commonly known as: DIFLUCAN       TAKE these medications    albuterol 108 (90 Base) MCG/ACT inhaler Commonly known as: VENTOLIN HFA Inhale 2 puffs into the lungs every 6 (six) hours as needed for wheezing or shortness of breath.   Bariatric Fusion Chew Chew 1 each by mouth daily.   budesonide-formoterol 80-4.5 MCG/ACT inhaler Commonly known as: Symbicort Inhale 2 puffs into the lungs 2 (two) times daily. What changed:  when to take this reasons to take this   buPROPion 75 MG tablet Commonly known as: WELLBUTRIN Take two tablets (75 mg total dose) by mouth daily. What changed:  how much to take how to take this when to take this additional instructions   butalbital-acetaminophen-caffeine 50-325-40 MG tablet Commonly known as: FIORICET Take 1 tablet by mouth every 6 (six) hours as needed for headache.   Calcium Citrate-Vitamin D 500-12.5 MG-MCG Chew Chew 1 each by mouth daily.   clonazePAM 0.5 MG tablet Commonly known as: KLONOPIN Take one tab 2-3 times a day for anxiety   docusate sodium 100 MG capsule Commonly known as: COLACE Take 100-200 mg by mouth daily as needed for moderate constipation.   gabapentin 300 MG capsule Commonly known as: NEURONTIN Take 600 mg by mouth 3 (three) times daily.   lidocaine 5 % Commonly known as: Lidoderm Place 1 patch onto the skin daily. Remove & Discard patch within 12 hours or as directed by MD   naloxone 4 MG/0.1ML Liqd nasal spray kit Commonly known as: NARCAN Place 1 spray into the nose once.   omeprazole 20 MG capsule Commonly known as: PRILOSEC Take 20 mg by mouth daily as needed (acid reflux).   ondansetron 8 MG tablet Commonly known as: Zofran Take 1 tablet (8 mg total) by mouth every 8 (eight) hours as needed for nausea.   oxyCODONE-acetaminophen 10-325 MG tablet Commonly known as: PERCOCET Take 1 tablet by mouth every 6 (six) hours.   temazepam 30 MG capsule Commonly known  as: RESTORIL Take 1 capsule (30 mg total) by mouth at bedtime.   tiZANidine 4 MG tablet Commonly known as: ZANAFLEX Take 4 mg by mouth every 8 (eight) hours as needed for muscle spasms.   valACYclovir 1000 MG tablet Commonly known as: VALTREX Take 1 tablet (1,000 mg total) by mouth 3 (three) times daily for 10 days.   vitamin C 500 MG tablet Commonly known as: ASCORBIC ACID Take 500 mg by mouth daily.   zinc gluconate 50 MG tablet Take 50 mg by mouth daily.   ziprasidone 80 MG capsule Commonly known as: GEODON Take 1 capsule (80 mg total) by mouth at bedtime.        Follow-up Information     Kathyrn Drown, MD Follow up.   Specialty: Family Medicine Contact information: [place] [address] [phone]                Allergies  Allergen Reactions   Ciprofloxacin Other (See Comments)    Nerve pain     Lyrica [Pregabalin] Anaphylaxis   Duloxetine Hcl     Make her more depressed, having nausea, vomiting, headache and threw up.    Feraheme [  Ferumoxytol] Hives   Injectafer [Ferric Carboxymaltose] Other (See Comments)    Fevers   Cyanocobalamin     Injectable only - numbness, tingling, muscle cramping, and heart palpations    Penicillins     Has patient had a PCN reaction causing immediate rash, facial/tongue/throat swelling, SOB or lightheadedness with hypotension: Yes Has patient had a PCN reaction causing severe rash involving mucus membranes or skin necrosis: No Has patient had a PCN reaction that required hospitalization No Has patient had a PCN reaction occurring within the last 10 years: No If all of the above answers are "NO", then may proceed with Cephalosporin use.     REACTION: Rash    Consultations: ID   Procedures/Studies: DG Chest 2 View  Result Date: 09/25/2021 CLINICAL DATA:  Fever with sharp left-sided chest pain. EXAM: CHEST - 2 VIEW COMPARISON:  None Available. FINDINGS: The heart size and mediastinal  contours are within normal limits. Mild bronchial wall thickening. No focal airspace consolidation. No pleural effusion. No pneumothorax. The visualized skeletal structures are unremarkable. IMPRESSION: 1. No focal airspace consolidation. 2. Mild bronchial wall thickening, as can be seen with bronchitis/reactive airway disease. Electronically Signed   By: Dahlia Bailiff M.D.   On: 09/25/2021 16:41      Subjective: Patient seen and examined at bedside this morning.  Hemodynamically stable for discharge.  Complains of some headache.  No nausea or vomiting.  Discharge Exam: Vitals:   10/01/21 0525 10/01/21 0941  BP: 108/64 123/80  Pulse: 67 62  Resp: 18 18  Temp: 98.4 F (36.9 C) 98.9 F (37.2 C)  SpO2: 97% 95%   Vitals:   09/30/21 1644 09/30/21 2109 10/01/21 0525 10/01/21 0941  BP: (!) 101/56 (!) 110/55 108/64 123/80  Pulse: 76 65 67 62  Resp: 19 18 18 18   Temp: 98.6 F (37 C) 98.4 F (36.9 C) 98.4 F (36.9 C) 98.9 F (37.2 C)  TempSrc: Oral Oral Oral   SpO2: 96% 100% 97% 95%  Weight:      Height:        General: Pt is alert, awake, not in acute distress, obese Cardiovascular: RRR, S1/S2 +, no rubs, no gallops Respiratory: CTA bilaterally, no wheezing, no rhonchi Abdominal: Soft, NT, ND, bowel sounds + Extremities: no edema, no cyanosis    The results of significant diagnostics from this hospitalization (including imaging, microbiology, ancillary and laboratory) are listed below for reference.     Microbiology: Recent Results (from the past 240 hour(s))  Novel Coronavirus, NAA ([facility])     Status: None   Collection Time: 09/25/21 12:00 AM   Specimen: Nasopharyngeal(NP) swabs in vial transport medium   Nasopharynge  Previous  Result Value Ref Range Status   SARS-CoV-2, NAA Not Detected Not Detected Final    Comment: This nucleic acid amplification test was developed and its performance characteristics determined by [employer]. Nucleic acid amplification  tests include RT-PCR and TMA. This test has not been FDA cleared or approved. This test has been authorized by FDA under an Emergency Use Authorization (EUA). This test is only authorized for the duration of time the declaration that circumstances exist justifying the authorization of the emergency use of in vitro diagnostic tests for detection of SARS-CoV-2 virus and/or diagnosis of COVID-19 infection under section 564(b)(1) of the Act, 21 U.S.C. 991ACQ-5(E) (1), unless the authorization is terminated or revoked sooner. When diagnostic testing is negative, the possibility of a false negative result should be considered in the context of a patient's  recent exposures and the presence of clinical signs and symptoms consistent with COVID-19. An individual without symptoms of COVID-19 and who is not shedding SARS-CoV-2 virus wo uld expect to have a negative (not detected) result in this assay.   Urine Culture     Status: None   Collection Time: 09/25/21 12:00 AM   Specimen: Urine   Urine  Result Value Ref Range Status   Urine Culture, Routine Final report  Final   Organism ID, Bacteria Comment  Final    Comment: Mixed urogenital flora 10,000-25,000 colony forming units per mL   Resp Panel by RT-PCR (Flu A&B, Covid) Urine, Clean Catch     Status: None   Collection Time: 09/27/21  2:33 AM   Specimen: Urine, Clean Catch; Nasal Swab  Result Value Ref Range Status   SARS Coronavirus 2 by RT PCR NEGATIVE NEGATIVE Final    Comment: (NOTE) SARS-CoV-2 target nucleic acids are NOT DETECTED.  The SARS-CoV-2 RNA is generally detectable in upper respiratory specimens during the acute phase of infection. The lowest concentration of SARS-CoV-2 viral copies this assay can detect is 138 copies/mL. A negative result does not preclude SARS-Cov-2 infection and should not be used as the sole basis for treatment or other patient management decisions. A negative result may occur with  improper specimen  collection/handling, submission of specimen other than nasopharyngeal swab, presence of viral mutation(s) within the areas targeted by this assay, and inadequate number of viral copies(<138 copies/mL). A negative result must be combined with clinical observations, patient history, and epidemiological information. The expected result is Negative.  Fact Sheet for Patients:  EntrepreneurPulse.com.au  Fact Sheet for Healthcare Providers:  IncredibleEmployment.be  This test is no t yet approved or cleared by the [place] FDA and  has been authorized for detection and/or diagnosis of SARS-CoV-2 by FDA under an Emergency Use Authorization (EUA). This EUA will remain  in effect (meaning this test can be used) for the duration of the COVID-19 declaration under Section 564(b)(1) of the Act, 21 U.S.C.section 360bbb-3(b)(1), unless the authorization is terminated  or revoked sooner.       Influenza A by PCR NEGATIVE NEGATIVE Final   Influenza B by PCR NEGATIVE NEGATIVE Final    Comment: (NOTE) The Xpert Xpress SARS-CoV-2/FLU/RSV plus assay is intended as an aid in the diagnosis of influenza from Nasopharyngeal swab specimens and should not be used as a sole basis for treatment. Nasal washings and aspirates are unacceptable for Xpert Xpress SARS-CoV-2/FLU/RSV testing.  Fact Sheet for Patients: EntrepreneurPulse.com.au  Fact Sheet for Healthcare Providers: IncredibleEmployment.be  This test is not yet approved or cleared by the [place] FDA and has been authorized for detection and/or diagnosis of SARS-CoV-2 by FDA under an Emergency Use Authorization (EUA). This EUA will remain in effect (meaning this test can be used) for the duration of the COVID-19 declaration under Section 564(b)(1) of the Act, 21 U.S.C. section 360bbb-3(b)(1), unless the authorization is terminated or revoked.  Performed at [facility], [address] [place], [place] [address]   CSF culture     Status: None   Collection Time: 09/27/21  3:55 AM   Specimen: CSF; Cerebrospinal Fluid  Result Value Ref Range Status   Specimen Description   Final    CSF Performed at [facility], [address], [place], [place] [address]    Special Requests   Final    NONE Performed at [facility], [address] [place], [place] [address]  Gram Stain   Final    WBC PRESENT, PREDOMINANTLY PMN NO ORGANISMS SEEN CYTOSPIN SMEAR CRITICAL RESULT CALLED TO, READ BACK BY AND VERIFIED WITH: PHARMD E.MARTIN ON 09/30/21 @ 1008 BY TS CORRECTED RESULTS PREVIOUSLY REPORTED AS: GRAM POSITIVE COCCI    Culture   Final    NO GROWTH 3 DAYS Performed at [facility], [place] [address] [place], [place] [address]    Report Status 09/30/2021 FINAL  Final     Labs: BNP (last 3 results) No results for input(s): BNP in the last 8760 hours. Basic Metabolic Panel: Recent Labs  Lab 09/25/21 1708 09/27/21 0132 09/28/21 0224 09/30/21 0101 10/01/21 0510  NA 138 138 137 141 145  K 3.7 4.6 3.2* 3.4* 3.6  CL 106 105 106 110 109  CO2 26 22 25 26 29   GLUCOSE 109* 103* 112* 98 100*  BUN 12 13 6  <5* <5*  CREATININE 0.73 0.80 0.77 0.65 0.61  CALCIUM 9.2 8.4* 8.3* 8.1* 8.3*  MG  --  2.0  --   --   --   PHOS  --  3.1  --   --   --    Liver Function Tests: Recent Labs  Lab 09/25/21 1708 09/27/21 0132 09/28/21 0224  AST 18 23 15   ALT 19 13 15   ALKPHOS 126 108 79  BILITOT 0.4 1.2 0.4  PROT 8.1 7.4 6.2*  ALBUMIN 4.6 4.0 3.1*   Recent Labs  Lab 09/25/21 1708 09/27/21 0132  LIPASE 31 29   No results for input(s): AMMONIA in the last 168 hours. CBC: Recent Labs  Lab 09/25/21 1708 09/27/21 0132 09/28/21 0224  WBC 13.8* 11.8* 8.6  NEUTROABS 11.9* 10.0*  --   HGB 15.8* 15.4* 12.4  HCT 47.0* 46.6* 36.5  MCV 95.9 96.9 95.8  PLT 377 273 259   Cardiac Enzymes: No results for input(s): CKTOTAL, CKMB, CKMBINDEX, TROPONINI in the  last 168 hours. BNP: Invalid input(s): POCBNP CBG: No results for input(s): GLUCAP in the last 168 hours. D-Dimer No results for input(s): DDIMER in the last 72 hours. Hgb A1c No results for input(s): HGBA1C in the last 72 hours. Lipid Profile No results for input(s): CHOL, HDL, LDLCALC, TRIG, CHOLHDL, LDLDIRECT in the last 72 hours. Thyroid function studies No results for input(s): TSH, T4TOTAL, T3FREE, THYROIDAB in the last 72 hours.  Invalid input(s): FREET3 Anemia work up No results for input(s): VITAMINB12, FOLATE, FERRITIN, TIBC, IRON, RETICCTPCT in the last 72 hours. Urinalysis    Component Value Date/Time   COLORURINE AMBER (A) 09/27/2021 0233   APPEARANCEUR HAZY (A) 09/27/2021 0233   LABSPEC 1.036 (H) 09/27/2021 0233   PHURINE 5.0 09/27/2021 0233   GLUCOSEU NEGATIVE 09/27/2021 0233   HGBUR NEGATIVE 09/27/2021 0233   HGBUR negative 04/09/2010 0758   BILIRUBINUR NEGATIVE 09/27/2021 0233   BILIRUBINUR negative 10/13/2020 1450   BILIRUBINUR neg 12/08/2016 1317   KETONESUR 80 (A) 09/27/2021 0233   PROTEINUR 100 (A) 09/27/2021 0233   UROBILINOGEN 0.2 10/13/2020 1450   UROBILINOGEN 0.2 12/08/2017 1528   NITRITE NEGATIVE 09/27/2021 0233   LEUKOCYTESUR NEGATIVE 09/27/2021 0233   Sepsis Labs Invalid input(s): PROCALCITONIN,  WBC,  LACTICIDVEN Microbiology Recent Results (from the past 240 hour(s))  Novel Coronavirus, NAA ([facility])     Status: None   Collection Time: 09/25/21 12:00 AM   Specimen: Nasopharyngeal(NP) swabs in vial transport medium   Nasopharynge  Previous  Result Value Ref Range Status   SARS-CoV-2, NAA Not Detected Not Detected Final  Comment: This nucleic acid amplification test was developed and its performance characteristics determined by [employer], [employer] and [employer]. Nucleic acid amplification tests include RT-PCR and TMA. This test has not been FDA cleared or approved. This test has been authorized by FDA under an Emergency Use Authorization (EUA).  This test is only authorized for the duration of time the declaration that circumstances exist justifying the authorization of the emergency use of in vitro diagnostic tests for detection of SARS-CoV-2 virus and/or diagnosis of COVID-19 infection under section 564(b)(1) of the Act, 21 U.S.C. 867YPP-5(K) (1), unless the authorization is terminated or revoked sooner. When diagnostic testing is negative, the possibility of a false negative result should be considered in the context of a patient's recent exposures and the presence of clinical signs and symptoms consistent with COVID-19. An individual without symptoms of COVID-19 and who is not shedding SARS-CoV-2 virus wo uld expect to have a negative (not detected) result in this assay.   Urine Culture     Status: None   Collection Time: 09/25/21 12:00 AM   Specimen: Urine   Urine  Result Value Ref Range Status   Urine Culture, Routine Final report  Final   Organism ID, Bacteria Comment  Final    Comment: Mixed urogenital flora 10,000-25,000 colony forming units per mL   Resp Panel by RT-PCR (Flu A&B, Covid) Urine, Clean Catch     Status: None   Collection Time: 09/27/21  2:33 AM   Specimen: Urine, Clean Catch; Nasal Swab  Result Value Ref Range Status   SARS Coronavirus 2 by RT PCR NEGATIVE NEGATIVE Final    Comment: (NOTE) SARS-CoV-2 target nucleic acids are NOT DETECTED.  The SARS-CoV-2 RNA is generally detectable in upper respiratory specimens during the acute phase of infection. The lowest concentration of SARS-CoV-2 viral copies this assay can detect is 138 copies/mL. A negative result does not preclude SARS-Cov-2 infection and should not be used as the sole basis for treatment or other patient management decisions. A negative result may occur with  improper specimen collection/handling, submission of specimen other than nasopharyngeal swab, presence of viral mutation(s) within the areas targeted by this assay, and  inadequate number of viral copies(<138 copies/mL). A negative result must be combined with clinical observations, patient history, and epidemiological information. The expected result is Negative.  Fact Sheet for Patients:  EntrepreneurPulse.com.au  Fact Sheet for Healthcare Providers:  IncredibleEmployment.be  This test is no t yet approved or cleared by the [place] FDA and  has been authorized for detection and/or diagnosis of SARS-CoV-2 by FDA under an Emergency Use Authorization (EUA). This EUA will remain  in effect (meaning this test can be used) for the duration of the COVID-19 declaration under Section 564(b)(1) of the Act, 21 U.S.C.section 360bbb-3(b)(1), unless the authorization is terminated  or revoked sooner.       Influenza A by PCR NEGATIVE NEGATIVE Final   Influenza B by PCR NEGATIVE NEGATIVE Final    Comment: (NOTE) The Xpert Xpress SARS-CoV-2/FLU/RSV plus assay is intended as an aid in the diagnosis of influenza from Nasopharyngeal swab specimens and should not be used as a sole basis for treatment. Nasal washings and aspirates are unacceptable for Xpert Xpress SARS-CoV-2/FLU/RSV testing.  Fact Sheet for Patients: EntrepreneurPulse.com.au  Fact Sheet for Healthcare Providers: IncredibleEmployment.be  This test is not yet approved or cleared by the [place] FDA and has been authorized for detection and/or diagnosis of SARS-CoV-2 by FDA under an Emergency Use Authorization (EUA). This EUA will remain  in effect (meaning this test can be used) for the duration of the COVID-19 declaration under Section 564(b)(1) of the Act, 21 U.S.C. section 360bbb-3(b)(1), unless the authorization is terminated or revoked.  Performed at [facility], [address] [place], [place] [address]   CSF culture     Status: None   Collection Time: 09/27/21  3:55 AM   Specimen: CSF; Cerebrospinal  Fluid  Result Value Ref Range Status   Specimen Description   Final    CSF Performed at [facility], [address] [place], [place] [address]    Special Requests   Final    NONE Performed at [facility], [address], [place], [place] [address]    Gram Stain   Final    WBC PRESENT, PREDOMINANTLY PMN NO ORGANISMS SEEN CYTOSPIN SMEAR CRITICAL RESULT CALLED TO, READ BACK BY AND VERIFIED WITH: PHARMD E.MARTIN ON 09/30/21 @ 1008 BY TS CORRECTED RESULTS PREVIOUSLY REPORTED AS: GRAM POSITIVE COCCI    Culture   Final    NO GROWTH 3 DAYS Performed at [facility], [place] [address], [place], [place] [address]    Report Status 09/30/2021 FINAL  Final    Please note: You were cared for by a hospitalist during your hospital stay. Once you are discharged, your primary care physician will handle any further medical issues. Please note that NO REFILLS for any discharge medications will be authorized once you are discharged, as it is imperative that you return to your primary care physician (or establish a relationship with a primary care physician if you do not have one) for your post hospital discharge needs so that they can reassess your need for medications and monitor your lab values.    Time coordinating discharge: 40 minutes  SIGNED:   Shelly Coss, MD  [facility] 10/01/2021, 10:25 AM Pager [phone]  If 7PM-7AM, please contact night-coverage [email or web address] Password [email or web address]

## 2021-10-02 ENCOUNTER — Encounter: Payer: Self-pay | Admitting: Family Medicine

## 2021-10-02 ENCOUNTER — Other Ambulatory Visit (HOSPITAL_COMMUNITY)
Admission: RE | Admit: 2021-10-02 | Discharge: 2021-10-02 | Disposition: A | Discharge status: Home / Self Care | Payer: PPO | Source: Ambulatory Visit | Attending: Family Medicine | Admitting: Family Medicine

## 2021-10-02 ENCOUNTER — Ambulatory Visit (INDEPENDENT_AMBULATORY_CARE_PROVIDER_SITE_OTHER): Payer: PPO | Admitting: Family Medicine

## 2021-10-02 ENCOUNTER — Other Ambulatory Visit: Payer: Self-pay | Admitting: Family Medicine

## 2021-10-02 VITALS — BP 130/82

## 2021-10-02 DIAGNOSIS — A879 Viral meningitis, unspecified: Secondary | ICD-10-CM | POA: Insufficient documentation

## 2021-10-02 DIAGNOSIS — I808 Phlebitis and thrombophlebitis of other sites: Secondary | ICD-10-CM

## 2021-10-02 DIAGNOSIS — R509 Fever, unspecified: Secondary | ICD-10-CM | POA: Insufficient documentation

## 2021-10-02 DIAGNOSIS — F331 Major depressive disorder, recurrent, moderate: Secondary | ICD-10-CM | POA: Diagnosis not present

## 2021-10-02 LAB — CBC WITH DIFFERENTIAL/PLATELET
Abs Immature Granulocytes: 0.09 10*3/uL — ABNORMAL HIGH (ref 0.00–0.07)
Basophils Absolute: 0.1 10*3/uL (ref 0.0–0.1)
Basophils Relative: 1 %
Eosinophils Absolute: 0.2 10*3/uL (ref 0.0–0.5)
Eosinophils Relative: 2 %
HCT: 44.8 % (ref 36.0–46.0)
Hemoglobin: 14.6 g/dL (ref 12.0–15.0)
Immature Granulocytes: 1 %
Lymphocytes Relative: 22 %
Lymphs Abs: 2 10*3/uL (ref 0.7–4.0)
MCH: 32.4 pg (ref 26.0–34.0)
MCHC: 32.6 g/dL (ref 30.0–36.0)
MCV: 99.6 fL (ref 80.0–100.0)
Monocytes Absolute: 0.5 10*3/uL (ref 0.1–1.0)
Monocytes Relative: 5 %
Neutro Abs: 6.3 10*3/uL (ref 1.7–7.7)
Neutrophils Relative %: 69 %
Platelets: 469 10*3/uL — ABNORMAL HIGH (ref 150–400)
RBC: 4.5 MIL/uL (ref 3.87–5.11)
RDW: 12.5 % (ref 11.5–15.5)
WBC: 9.1 10*3/uL (ref 4.0–10.5)
nRBC: 0 % (ref 0.0–0.2)

## 2021-10-02 LAB — HEPATIC FUNCTION PANEL
ALT: 18 U/L (ref 0–44)
AST: 16 U/L (ref 15–41)
Albumin: 4.3 g/dL (ref 3.5–5.0)
Alkaline Phosphatase: 99 U/L (ref 38–126)
Bilirubin, Direct: 0.1 mg/dL (ref 0.0–0.2)
Indirect Bilirubin: 0.2 mg/dL — ABNORMAL LOW (ref 0.3–0.9)
Total Bilirubin: 0.3 mg/dL (ref 0.3–1.2)
Total Protein: 7.8 g/dL (ref 6.5–8.1)

## 2021-10-02 LAB — BASIC METABOLIC PANEL
Anion gap: 8 (ref 5–15)
BUN: 7 mg/dL (ref 6–20)
CO2: 27 mmol/L (ref 22–32)
Calcium: 9.1 mg/dL (ref 8.9–10.3)
Chloride: 107 mmol/L (ref 98–111)
Creatinine, Ser: 0.58 mg/dL (ref 0.44–1.00)
GFR, Estimated: >60 mL/min (ref >60)
Glucose, Bld: 99 mg/dL (ref 70–99)
Potassium: 3.8 mmol/L (ref 3.5–5.1)
Sodium: 142 mmol/L (ref 135–145)

## 2021-10-02 MED ORDER — FLUCONAZOLE 150 MG PO TABS
ORAL_TABLET | ORAL | 0 refills | Status: DC
Start: 2021-10-02 — End: 2022-01-28

## 2021-10-02 MED ORDER — NYSTATIN 100000 UNIT/ML MT SUSP: OROMUCOSAL | 0 refills | Status: DC

## 2021-10-02 MED ORDER — OMEPRAZOLE 20 MG PO CPDR: 20.0000 mg | DELAYED_RELEASE_CAPSULE | Freq: Every day | ORAL | 6 refills | Status: DC | PRN

## 2021-10-02 NOTE — Progress Notes (Signed)
   Subjective:    Patient ID: Katelyn [name], female    DOB: 08-Aug-1975, 46 y.o.   MRN: [id number]  HPI Pt following up from hospital follow up. Pt went to [facility] and was then transferred to [facility]. Pt was sent home on Valtrex. Pt also requesting Diflucan due to antibiotic use. Pt states she believes she has phlebitis. Also having confusion, increased thirst, not feeling well and having dry mouth.   Patient recently in the hospital for what appears to be viral meningitis.  Initially had gram-positive cocci on his smear but culture came back negative.  She was sent home on Valtrex  She complains of headache fatigue tiredness worn out as well as feeling hot and cold clammy as well as ongoing headaches and discomforts.  She also relates pain and discomfort in her left forearm where IV was Review of Systems     Objective:   Physical Exam Lungs clear respiratory rate normal heart regular slightly tachycardic extremities no edema in the legs Some tenderness in the left forearm consistent with superficial thrombophlebitis       Assessment & Plan:  1. Viral meningitis This will take some time to get over this.  She will have ongoing fatigue tiredness sluggishness mild difficulty concentrating and focusing along with headaches should gradually get better over the next few weeks follow-up within 2 to 3 weeks - Basic Metabolic Panel (BMET) - CBC with Differential - Hepatic function panel  2. Hypocalcemia Repeat lab work - Biomedical engineer Panel (BMET) - CBC with Differential - Hepatic function panel  3. Febrile illness Repeat CBC - Basic Metabolic Panel (BMET) - CBC with Differential - Hepatic function panel  4. Superficial thrombophlebitis of right upper extremity I would recommend venous ultrasound to rule out DVT this to be done either today or tomorrow

## 2021-10-02 NOTE — Progress Notes (Signed)
Pt is aware.  

## 2021-10-03 ENCOUNTER — Ambulatory Visit (HOSPITAL_COMMUNITY)
Admission: RE | Admit: 2021-10-03 | Discharge: 2021-10-03 | Disposition: A | Discharge status: Home / Self Care | Payer: PPO | Source: Ambulatory Visit | Attending: Family Medicine | Admitting: Family Medicine

## 2021-10-03 DIAGNOSIS — I808 Phlebitis and thrombophlebitis of other sites: Secondary | ICD-10-CM | POA: Diagnosis not present

## 2021-10-03 DIAGNOSIS — I82612 Acute embolism and thrombosis of superficial veins of left upper extremity: Secondary | ICD-10-CM | POA: Diagnosis not present

## 2021-10-03 DIAGNOSIS — M79602 Pain in left arm: Secondary | ICD-10-CM | POA: Diagnosis not present

## 2021-10-07 ENCOUNTER — Telehealth (HOSPITAL_BASED_OUTPATIENT_CLINIC_OR_DEPARTMENT_OTHER): Payer: PPO | Admitting: Psychiatry

## 2021-10-07 ENCOUNTER — Encounter (HOSPITAL_COMMUNITY): Payer: Self-pay | Admitting: Psychiatry

## 2021-10-07 DIAGNOSIS — F411 Generalized anxiety disorder: Secondary | ICD-10-CM

## 2021-10-07 DIAGNOSIS — F331 Major depressive disorder, recurrent, moderate: Secondary | ICD-10-CM

## 2021-10-07 DIAGNOSIS — F5101 Primary insomnia: Secondary | ICD-10-CM

## 2021-10-07 DIAGNOSIS — F509 Eating disorder, unspecified: Secondary | ICD-10-CM | POA: Diagnosis not present

## 2021-10-07 MED ORDER — CLONAZEPAM 0.5 MG PO TABS: ORAL_TABLET | ORAL | 1 refills | Status: DC

## 2021-10-07 MED ORDER — ZIPRASIDONE HCL 80 MG PO CAPS: 80.0000 mg | ORAL_CAPSULE | Freq: Every day | ORAL | 2 refills | Status: DC

## 2021-10-07 MED ORDER — TEMAZEPAM 30 MG PO CAPS: 30.0000 mg | ORAL_CAPSULE | Freq: Every day | ORAL | 2 refills | Status: DC

## 2021-10-07 MED ORDER — BUPROPION HCL 75 MG PO TABS: ORAL_TABLET | ORAL | 2 refills | Status: DC

## 2021-10-07 NOTE — Progress Notes (Signed)
Virtual Visit via Telephone Note  I connected with Katelyn [name] on 10/07/21 at  3:10 PM EDT by telephone and verified that I am speaking with the correct person using two identifiers.  Location: Patient: Home Provider: Home Office   I discussed the limitations, risks, security and privacy concerns of performing an evaluation and management service by telephone and the availability of in person appointments. I also discussed with the patient that there may be a patient responsible charge related to this service. The patient expressed understanding and agreed to proceed.   History of Present Illness: Patient is evaluated by phone session.  She reported a lot of stress and anxiety since the last visit.  Her 16 year old daughter diagnosed with esophagitis and requires feeding tube.  She is having another endoscopy this Wednesday.  Her husband diagnosed with melanoma and going through treatment.  Her 51 year old son moved back to live with her because his father kicked him out.  Patient told he has a girlfriend and his father does not like being around home.  Patient told his father also very upset that he is not working and even though son is enrolled in online classes but does not study.  Patient herself is also very sick and has diagnosed with viral meningitis and she was in the hospital until recently she discharge from the hospital.  She was taking antiviral.  She admitted not taking the Geodon because her physician recommended not to take the Geodon while taking the antifungal and antiviral medication.  She admitted increased paranoia, irritability, severe anxiety and panic attack.  She had a hip surgery a few months ago and is still have pain in her hip.  Though she is feeling better with laryngitis but is still sometimes gets sluggish in her thinking.  She is not sleeping well.  She is in therapy with [facility] and also seeing Lynden Ang for nutritional counseling.  She denies any binge  eating but endorses too much anxiety as too much thing going on with her.  She has panic attacks.  She denies suicidal thoughts but endorses crying spells.  She is also on gabapentin, Percocet and muscle relaxant and pain patches.  She is hoping in the future cut down her pain management which is done by specialist at [place].  Her appetite is fair but she described weight is unchanged from the past.  She denies drinking or using any illegal substances.  She is very concerned about her daughter, husband and now her 10 year old son.  Her son does not drive because he never learned and his father never took steps to help him.  Now patient has a bigger test to get his license, driving so he can get a job.  So far she is relieved that her son is not acting out who has a history of behavior problems.  Patient is taking day by day and slowly and gradually.  She does not drive by herself due to her hip pain but in emergency if needed then she take her family members to the doctor's appointment.  She denies drinking or using any illegal substances.   Past Psychiatric History:  H/O multiple inpatient. Last admission Feb 2015.  Good response with ECT but scared to continue maintenance ECT.  Tried Cymbalta (suicidal thoughts), Lexapro, Prozac (sexual side effects), Abilify, lithium, Lamictal, Ritalin, Remeron, Risperdal,Vistaril, BuSpar and Valium.  Psychiatric Specialty Exam: Physical Exam  Review of Systems  Musculoskeletal:        Hip pain  Neurological:  Positive for numbness.    Weight 203 lb (92.1 kg), last menstrual period 09/09/2021.There is no height or weight on file to calculate BMI.  General Appearance: NA  Eye Contact:  NA  Speech:  Slow  Volume:  Decreased  Mood:  Anxious and Dysphoric  Affect:  NA  Thought Process:  Descriptions of Associations: Intact  Orientation:  Full (Time, Place, and Person)  Thought Content:  Paranoid Ideation and Rumination  Suicidal Thoughts:  No  Homicidal  Thoughts:  No  Memory:  Immediate;   Fair Recent;   Fair Remote;   Fair  Judgement:  Fair  Insight:  Present  Psychomotor Activity:  NA  Concentration:  Concentration: Fair and Attention Span: Fair  Recall:  [employer] of Knowledge:  Good  Language:  Good  Akathisia:  No  Handed:  Right  AIMS (if indicated):     Assets:  Communication Skills Desire for Improvement Housing  ADL's:  Intact  Cognition:  WNL  Sleep:   poor      Assessment and Plan: Generalized anxiety disorder.  Primary insomnia.  Eating disorder.  Major depressive disorder, recurrent.  I reviewed blood work results from recent hospitalization.  Her liver enzymes are okay.  We talk about polypharmacy but patient reported too many stressors in her life.  She is in therapy with counselor and also seeing a nutrition therapist.  She reported her binge eating and depression is somewhat stable but like to go back on Geodon since she is no longer taking antiviral and antifungal.  She is concerned about her daughter who now diagnosed with esophagitis and she also diagnosed with central auditory processing disorder.  She has no extra responsibility of son who now moved in with her.  I encouraged she should see her therapist more frequently as currently she is seeing once a month.  She agreed with the plan.  I recommend if possible she can cut down her Klonopin to only 2 times a day rather than 3 times a day as patient taking a lot of medication and recently had an episode of viral meningitis.  She agreed with the plan.  Continue Wellbutrin 75 mg 2 tablets daily, temazepam 30 mg at bedtime, she will restart Geodon 80 mg at bedtime and she will try to cut down her Klonopin 0.5 mg up to 2 times a day.  Recommended to call us back if she has any question or any concern.  Discussed safety concerns at any time having active suicidal thoughts or homicidal thought then she need to call 911 or go to local emergency room.  Follow-up at 3  months.  Follow Up Instructions:    I discussed the assessment and treatment plan with the patient. The patient was provided an opportunity to ask questions and all were answered. The patient agreed with the plan and demonstrated an understanding of the instructions.   The patient was advised to call back or seek an in-person evaluation if the symptoms worsen or if the condition fails to improve as anticipated.  Collaboration of Care: Other provider involved in patient's care AEB notes are available in epic to review.  Patient/Guardian was advised Release of Information must be obtained prior to any record release in order to collaborate their care with an outside provider. Patient/Guardian was advised if they have not already done so to contact the registration department to sign all necessary forms in order for [place] to release information regarding their care.   Consent: Patient/Guardian  gives verbal consent for treatment and assignment of benefits for services provided during this visit. Patient/Guardian expressed understanding and agreed to proceed.    I provided 36 minutes of non-face-to-face time during this encounter.   Kathlee Nations, MD

## 2021-10-10 LAB — ARBOVIRUS IGG, CSF
California Enceph IgG: <1:1 {titer}
Eastern eq encephalitis, IgG: <1:1 {titer}
St Louis encephalitis, IgG: <1:1 {titer}
West Nile IgG CSF: 1.18 IV (ref <1.30)
Western eq encephalitis, IgG: <1:1 {titer}

## 2021-10-14 ENCOUNTER — Other Ambulatory Visit: Payer: Self-pay

## 2021-10-14 ENCOUNTER — Ambulatory Visit: Payer: PPO | Admitting: Internal Medicine

## 2021-10-14 ENCOUNTER — Encounter: Payer: Self-pay | Admitting: Internal Medicine

## 2021-10-14 VITALS — BP 141/85 | HR 86 | Temp 98.5°F

## 2021-10-14 DIAGNOSIS — G039 Meningitis, unspecified: Secondary | ICD-10-CM | POA: Diagnosis not present

## 2021-10-14 NOTE — Progress Notes (Signed)
Patient Active Problem List   Diagnosis Date Noted   Meningitis 09/27/2021   Epigastric pain 09/27/2021   GERD (gastroesophageal reflux disease) 09/27/2021   Rash and nonspecific skin eruption 09/27/2021   Hypocalcemia 09/27/2021   Chronic pain syndrome 09/27/2021   Sepsis ([place])    Fever 09/25/2021   Tear of left gluteus medius tendon    Asthma 07/18/2020   Vitamin B 12 deficiency 10/16/2019   Elevated LFTs 10/16/2019   Asthma, exogenous, unspecified asthma severity, uncomplicated 37/01/6268   B12 deficiency anemia 06/21/2018   Venous insufficiency 04/23/2018   Onychomycosis of toenail 04/17/2018   Chronic myofascial pain 03/18/2018   Small fiber neuropathy 03/18/2018   Idiopathic small fiber sensory neuropathy 02/23/2018   Varicose veins of both lower extremities 01/13/2018   Cervical spondylosis without myelopathy 08/28/2017   Migraine with aura and without status migrainosus, not intractable 07/22/2017   Lumbar degenerative disc disease 07/20/2017   Spondylosis of lumbar spine 07/20/2017   Coccydynia 03/12/2017   Bariatric surgery status 05/02/2016   Major depressive disorder, recurrent episode, moderate ([place]) 02/18/2016   Tachy-brady syndrome ([place]) 12/19/2015   Iron deficiency anemia 06/10/2015   Sleep disorder 06/01/2013   Generalized anxiety disorder 05/30/2013   Chronic headaches 07/01/2012   Headache 07/01/2012   Depression, major, recurrent, severe with psychosis ([place]) 06/17/2012   Severe recurrent major depressive disorder with psychotic symptoms ([place]) 06/17/2012   Family history of colonic polyps 03/03/2012   Eating disorder 12/18/2011   Nausea & vomiting 01/22/2010   Polycystic ovarian syndrome 09/01/2008   Intermittent left-sided chest pain 08/02/2008    Patient's Medications  New Prescriptions   No medications on file  Previous Medications   ALBUTEROL (VENTOLIN HFA) 108 (90 BASE) MCG/ACT INHALER    Inhale 2 puffs into the lungs every 6  (six) hours as needed for wheezing or shortness of breath.   BUDESONIDE-FORMOTEROL (SYMBICORT) 80-4.5 MCG/ACT INHALER    Inhale 2 puffs into the lungs 2 (two) times daily.   BUPROPION (WELLBUTRIN) 75 MG TABLET    Take two tablets (75 mg total dose) by mouth daily.   BUTALBITAL-ACETAMINOPHEN-CAFFEINE (FIORICET) 50-325-40 MG TABLET    Take 1 tablet by mouth every 6 (six) hours as needed for headache.   CALCIUM CITRATE-VITAMIN D 500-12.5 MG-MCG CHEW    Chew 1 each by mouth daily.   CLONAZEPAM (KLONOPIN) 0.5 MG TABLET    Take one tab twice day for anxiety as needed   DOCUSATE SODIUM (COLACE) 100 MG CAPSULE    Take 100-200 mg by mouth daily as needed for moderate constipation.   FLUCONAZOLE (DIFLUCAN) 150 MG TABLET    Take one tablet po mouth today; may repeat in 7 days   GABAPENTIN (NEURONTIN) 300 MG CAPSULE    Take 600 mg by mouth 3 (three) times daily.   LIDOCAINE (LIDODERM) 5 %    Place 1 patch onto the skin daily. Remove & Discard patch within 12 hours or as directed by MD   MULTIPLE VITAMINS-MINERALS (BARIATRIC FUSION) CHEW    Chew 1 each by mouth daily.   NALOXONE (NARCAN) NASAL SPRAY 4 MG/0.1 ML    Place 1 spray into the nose once.   NYSTATIN (MYCOSTATIN) 100000 UNIT/ML SUSPENSION    Swish and swallow 5 mls po QID for 7 days   OMEPRAZOLE (PRILOSEC) 20 MG CAPSULE    Take 1 capsule (20 mg total) by mouth daily as needed (acid reflux).   ONDANSETRON (ZOFRAN) 8  MG TABLET    Take 1 tablet (8 mg total) by mouth every 8 (eight) hours as needed for nausea.   OXYCODONE-ACETAMINOPHEN (PERCOCET) 10-325 MG TABLET    Take 1 tablet by mouth every 6 (six) hours.   TEMAZEPAM (RESTORIL) 30 MG CAPSULE    Take 1 capsule (30 mg total) by mouth at bedtime.   TIZANIDINE (ZANAFLEX) 4 MG TABLET    Take 4 mg by mouth every 8 (eight) hours as needed for muscle spasms.   VITAMIN C (ASCORBIC ACID) 500 MG TABLET    Take 500 mg by mouth daily.   ZINC GLUCONATE 50 MG TABLET    Take 50 mg by mouth daily.   ZIPRASIDONE  (GEODON) 80 MG CAPSULE    Take 1 capsule (80 mg total) by mouth at bedtime.  Modified Medications   No medications on file  Discontinued Medications   No medications on file    Subjective: 46 year old female with past medical history as below presents for hospital follow-up.  She was admitted to [facility] 6/2 - 6/6 for meningitis.  Found to have nausea, headaches, fever x3 days prior to admission.  She also had erythematous papular rash on her trunk and periorbital region.  Her LP showed 32 WBC, 8 RBC, protein 64.  Work-up including HIV ,RMSF was negative.  There was insufficient sample from the LP to do ME panel, HSV, VZV as that she was empirically treated with acyclovir inpatient and discharged on Valtrex to complete 2 weeks of antiviral therapy.  ID follow-up given as arbovirus and VDRL is pending. Today 10/14/2020 patient reports that she continues to have headache although much improved.  She does not have neck stiffness that she did during the admission.  Admitted as in the external parietal region.  She has no associated photophobia, nausea moderate and fever rash.  She completed  Valtrex  on 6/15.  Review of Systems: Review of Systems  All other systems reviewed and are negative.   Past Medical History:  Diagnosis Date   Anemia    Anorexia    Anxiety    Asthma    controlled with meds   Benign juvenile melanoma    Chronic headaches    Colon polyps    found on colonoscopy 40/98/1191   Complication of anesthesia    itching after epidural for c section   Constipation    Depression    Depression    several suicide attempts, hospitaluzed in 2012 for this; hx pf ECT treatments ; pt sees Dr. Adele Schilder pyschiatrist  and doing well on medication   Dysrhythmia    hx of PVC's (pt hasn't seen cardiologist since 2021- no follow up needed)   GERD (gastroesophageal reflux disease)    Headache    Heart murmur    as a child - no one mentions hearing murmur anymore, sometimes it is heard.  Echo has been performed 02/03/18   Heartburn    no meds   History of pneumonia    x 3  years ago - no recent problems   Hx of blood clots    Neuromuscular disorder (HCC)    small fiber neuropathy   Obesity    Pneumonia    Polycystic ovary    takes metformin to treat   Renal atrophy, left 2023   Skin lesion    Excisional biopsy of moles - none cancerous   Sleep apnea    yrs ago - diagnosed mild sleep apnea - did not have to  use cpap     Social History   Tobacco Use   Smoking status: Former    Packs/day: 1.00    Years: 5.00    Total pack years: 5.00    Types: Cigarettes    Start date: 45    Quit date: 08/26/1997    Years since quitting: 24.1   Smokeless tobacco: Never  Vaping Use   Vaping Use: Never used  Substance Use Topics   Alcohol use: No    Alcohol/week: 0.0 standard drinks of alcohol   Drug use: No    Family History  Problem Relation Age of Onset   Hypercholesterolemia Mother    Hypertension Mother        Iterstitial Cystist   Hyperlipidemia Mother    Cancer Mother 50       breast    Depression Brother    Alcohol abuse Brother    Colon polyps Father    Depression Father    Irritable bowel syndrome Father    Alcohol abuse Father    Colon cancer Paternal Aunt 28   Heart attack Paternal Grandfather    Kidney cancer Paternal Grandfather    Cancer Maternal Grandfather        unknown type   Esophageal cancer Neg Hx    Stomach cancer Neg Hx     Allergies  Allergen Reactions   Ciprofloxacin Other (See Comments)    Nerve pain     Lyrica [Pregabalin] Anaphylaxis   Duloxetine Hcl     Make her more depressed, having nausea, vomiting, headache and threw up.    Feraheme [Ferumoxytol] Hives   Injectafer [Ferric Carboxymaltose] Other (See Comments)    Fevers   Cyanocobalamin     Injectable only - numbness, tingling, muscle cramping, and heart palpations    Penicillins     Has patient had a PCN reaction causing immediate rash, facial/tongue/throat  swelling, SOB or lightheadedness with hypotension: Yes Has patient had a PCN reaction causing severe rash involving mucus membranes or skin necrosis: No Has patient had a PCN reaction that required hospitalization No Has patient had a PCN reaction occurring within the last 10 years: No If all of the above answers are "NO", then may proceed with Cephalosporin use.     REACTION: Rash    Health Maintenance  Topic Date Due   PAP SMEAR-Modifier  09/06/2017   COVID-19 Vaccine (3 - Moderna risk series) 09/09/2019   TETANUS/TDAP  03/30/2021   INFLUENZA VACCINE  11/26/2021   COLONOSCOPY (Pts 45-79yr Insurance coverage will need to be confirmed)  10/18/2022   Hepatitis C Screening  Completed   HIV Screening  Completed   HPV VACCINES  Aged Out    Objective:  There were no vitals filed for this visit. There is no height or weight on file to calculate BMI.  Physical Exam Constitutional:      Appearance: Normal appearance.  HENT:     Head: Normocephalic and atraumatic.     Right Ear: Tympanic membrane normal.     Left Ear: Tympanic membrane normal.     Nose: Nose normal.     Mouth/Throat:     Mouth: Mucous membranes are moist.  Eyes:     Extraocular Movements: Extraocular movements intact.     Conjunctiva/sclera: Conjunctivae normal.     Pupils: Pupils are equal, round, and reactive to light.  Cardiovascular:     Rate and Rhythm: Normal rate and regular rhythm.     Heart sounds: No murmur heard.  No friction rub. No gallop.  Pulmonary:     Effort: Pulmonary effort is normal.     Breath sounds: Normal breath sounds.  Abdominal:     General: Abdomen is flat.     Palpations: Abdomen is soft.  Musculoskeletal:        General: Normal range of motion.  Skin:    General: Skin is warm and dry.  Neurological:     General: No focal deficit present.     Mental Status: She is alert and oriented to person, place, and time.  Psychiatric:        Mood and Affect: Mood normal.      Lab Results Lab Results  Component Value Date   WBC 9.1 10/02/2021   HGB 14.6 10/02/2021   HCT 44.8 10/02/2021   MCV 99.6 10/02/2021   PLT 469 (H) 10/02/2021    Lab Results  Component Value Date   CREATININE 0.58 10/02/2021   BUN 7 10/02/2021   NA 142 10/02/2021   K 3.8 10/02/2021   CL 107 10/02/2021   CO2 27 10/02/2021    Lab Results  Component Value Date   ALT 18 10/02/2021   AST 16 10/02/2021   ALKPHOS 99 10/02/2021   BILITOT 0.3 10/02/2021    Lab Results  Component Value Date   CHOL 140 01/12/2019   HDL 65 01/12/2019   LDLCALC 67 01/12/2019   TRIG 39 01/12/2019   CHOLHDL 2.2 01/12/2019   Lab Results  Component Value Date   LABRPR NON REACTIVE 09/28/2021   No results found for: "HIV1RNAQUANT", "HIV1RNAVL", "CD4TABS"   A/P #Meningitis 2/2 likely viral etiology #GAD and MDD SP ECT -Completed Valtrex 1gm tid to complete 2 weeks of anti--viral therapy EOT 6/15 -Arbovirus panel and VDRL negative. Pt reports occipital and parietal HA continues although improved. She has had a long history of HA starting form ECT treatments, now has additional stress(husband ill) which is likely contributing to HA rather than an infectious etiology. No photophobia, full ROM of neck.  Plna: -Follow-up PRN Laurice Record, MD [place] [facility] 10/14/2021, 3:20 PM

## 2021-10-17 DIAGNOSIS — F331 Major depressive disorder, recurrent, moderate: Secondary | ICD-10-CM | POA: Diagnosis not present

## 2021-10-24 ENCOUNTER — Ambulatory Visit (INDEPENDENT_AMBULATORY_CARE_PROVIDER_SITE_OTHER): Payer: PPO | Admitting: Family Medicine

## 2021-10-24 VITALS — BP 130/82 | HR 115 | Temp 96.6°F | Ht 65.5 in

## 2021-10-24 DIAGNOSIS — R030 Elevated blood-pressure reading, without diagnosis of hypertension: Secondary | ICD-10-CM

## 2021-10-24 NOTE — Progress Notes (Signed)
   Subjective:    Patient ID: Katelyn [name], female    DOB: [birth year], 46 y.o.   MRN: [id number]  HPI Hospitalization follow up for viral meningitis-completed treatment Patient is starting to improve This is gradual Blood pressure readings at home have been elevated Headaches have been less frequent  Elevated BP readings home cuff today     Review of Systems     Objective:   Physical Exam  General-in no acute distress Eyes-no discharge Lungs-respiratory rate normal, CTA CV-no murmurs,RRR Extremities skin warm dry no edema Neuro grossly normal Behavior normal, alert       Assessment & Plan:  Blood pressure on recheck was reasonable I believe her home readings are elevated beyond what they really are because of her machine She will work hard on healthy diet regular activity and follow-up again within the next 3 months send [place] blood pressure readings

## 2021-10-31 DIAGNOSIS — M7062 Trochanteric bursitis, left hip: Secondary | ICD-10-CM | POA: Diagnosis not present

## 2021-10-31 DIAGNOSIS — M533 Sacrococcygeal disorders, not elsewhere classified: Secondary | ICD-10-CM | POA: Diagnosis not present

## 2021-10-31 DIAGNOSIS — M7918 Myalgia, other site: Secondary | ICD-10-CM | POA: Diagnosis not present

## 2021-10-31 DIAGNOSIS — G894 Chronic pain syndrome: Secondary | ICD-10-CM | POA: Diagnosis not present

## 2021-10-31 DIAGNOSIS — G8929 Other chronic pain: Secondary | ICD-10-CM | POA: Diagnosis not present

## 2021-10-31 DIAGNOSIS — M5136 Other intervertebral disc degeneration, lumbar region: Secondary | ICD-10-CM | POA: Diagnosis not present

## 2021-10-31 DIAGNOSIS — M461 Sacroiliitis, not elsewhere classified: Secondary | ICD-10-CM | POA: Diagnosis not present

## 2021-11-01 DIAGNOSIS — F331 Major depressive disorder, recurrent, moderate: Secondary | ICD-10-CM | POA: Diagnosis not present

## 2021-11-15 ENCOUNTER — Telehealth (HOSPITAL_COMMUNITY): Payer: Self-pay | Admitting: *Deleted

## 2021-11-15 NOTE — Telephone Encounter (Signed)
She stopped taking Geodon because she was told by the doctor that she is taking antiviral and antifungal.  If she is not taking antiviral or antifungal then she can resume Geodon 80 mg at bedtime.  However if she is still taking antiviral and antifungal then she need to contact the provider if they can switch to a different medication because Geodon has helped her paranoia and anxiety.  We can try to see her earlier but stopping the antiviral and antifungal or if she completed the course may help the situation.

## 2021-11-15 NOTE — Telephone Encounter (Signed)
Just because having too long on Geodon does not mean that it does not work.  She has paranoia and she had tried a lot of the medication and Geodon seems to be working better than the medication.  I agree she can make a earlier appointment but she would go back to [place] until her next appointment.

## 2021-11-15 NOTE — Telephone Encounter (Signed)
Writer returned pt call requesting to know when she started taking Geodon. Writer did provide that information. Pt states that she has stopped the Geodon a few months ago and states you have discussed with her. Pt says that she is feeling very depressed without S.I., as well as having severe anxiety and panic attacks; racing heart, palpitations. Pt stated that she had panic attack while driving yesterday which caused her to throw up. Pt also endorses very frequent crying spells. When asked pt says that the Klonopin 0.5 mg does not "do much" for this level of anxiety. Pt next appointment scheduled for 01/07/22. I can check your schedule to see if she be seen earlier if needed. Thanks.

## 2021-11-15 NOTE — Telephone Encounter (Signed)
Ok thanks 

## 2021-11-15 NOTE — Telephone Encounter (Signed)
She has finished course of those meds. But wanted to stay off the Geodon having been on it for so long. Making earlier appointment.

## 2021-11-18 ENCOUNTER — Other Ambulatory Visit (HOSPITAL_COMMUNITY): Payer: Self-pay | Admitting: Nephrology

## 2021-11-18 ENCOUNTER — Encounter: Payer: Self-pay | Admitting: Family Medicine

## 2021-11-18 ENCOUNTER — Other Ambulatory Visit: Payer: Self-pay | Admitting: Nephrology

## 2021-11-18 DIAGNOSIS — G629 Polyneuropathy, unspecified: Secondary | ICD-10-CM | POA: Diagnosis not present

## 2021-11-18 DIAGNOSIS — N261 Atrophy of kidney (terminal): Secondary | ICD-10-CM | POA: Diagnosis not present

## 2021-11-18 DIAGNOSIS — N182 Chronic kidney disease, stage 2 (mild): Secondary | ICD-10-CM | POA: Diagnosis not present

## 2021-11-18 DIAGNOSIS — I129 Hypertensive chronic kidney disease with stage 1 through stage 4 chronic kidney disease, or unspecified chronic kidney disease: Secondary | ICD-10-CM | POA: Diagnosis not present

## 2021-11-18 DIAGNOSIS — R809 Proteinuria, unspecified: Secondary | ICD-10-CM | POA: Diagnosis not present

## 2021-11-18 DIAGNOSIS — N390 Urinary tract infection, site not specified: Secondary | ICD-10-CM | POA: Diagnosis not present

## 2021-11-19 ENCOUNTER — Other Ambulatory Visit: Payer: Self-pay | Admitting: *Deleted

## 2021-11-19 DIAGNOSIS — N182 Chronic kidney disease, stage 2 (mild): Secondary | ICD-10-CM

## 2021-11-19 DIAGNOSIS — R739 Hyperglycemia, unspecified: Secondary | ICD-10-CM

## 2021-11-19 NOTE — Telephone Encounter (Signed)
Nurses I read Katelyn [name]'s note Please connect with her to have her do the following We can let her know the results of the test as they are completed  I would recommend the following: Fasting glucose, A1c, urine ACR Diagnosis chronic kidney disease, hyperglycemia  Should be noted that her blood work in June showed normal glucose but it is still important to make sure there is not diabetes in the face of chronic kidney disease.  The urine ACR is to look at protein levels within her urine  Please order these and have [place] complete this lab order-thanks-Dr. Nicki Reaper

## 2021-11-20 DIAGNOSIS — N182 Chronic kidney disease, stage 2 (mild): Secondary | ICD-10-CM | POA: Insufficient documentation

## 2021-11-21 ENCOUNTER — Encounter (HOSPITAL_COMMUNITY): Payer: Self-pay | Admitting: Hematology

## 2021-11-21 DIAGNOSIS — N760 Acute vaginitis: Secondary | ICD-10-CM | POA: Diagnosis not present

## 2021-11-21 DIAGNOSIS — R350 Frequency of micturition: Secondary | ICD-10-CM | POA: Diagnosis not present

## 2021-11-21 DIAGNOSIS — F331 Major depressive disorder, recurrent, moderate: Secondary | ICD-10-CM | POA: Diagnosis not present

## 2021-11-21 DIAGNOSIS — B372 Candidiasis of skin and nail: Secondary | ICD-10-CM | POA: Diagnosis not present

## 2021-11-21 DIAGNOSIS — N898 Other specified noninflammatory disorders of vagina: Secondary | ICD-10-CM | POA: Diagnosis not present

## 2021-11-25 DIAGNOSIS — Z79899 Other long term (current) drug therapy: Secondary | ICD-10-CM | POA: Diagnosis not present

## 2021-11-25 DIAGNOSIS — F5001 Anorexia nervosa, restricting type: Secondary | ICD-10-CM | POA: Diagnosis not present

## 2021-11-25 DIAGNOSIS — F331 Major depressive disorder, recurrent, moderate: Secondary | ICD-10-CM | POA: Diagnosis not present

## 2021-11-25 DIAGNOSIS — F411 Generalized anxiety disorder: Secondary | ICD-10-CM | POA: Diagnosis not present

## 2021-11-26 ENCOUNTER — Telehealth (HOSPITAL_COMMUNITY): Payer: Self-pay | Admitting: *Deleted

## 2021-11-26 DIAGNOSIS — R739 Hyperglycemia, unspecified: Secondary | ICD-10-CM | POA: Diagnosis not present

## 2021-11-26 DIAGNOSIS — N182 Chronic kidney disease, stage 2 (mild): Secondary | ICD-10-CM | POA: Diagnosis not present

## 2021-11-26 NOTE — Telephone Encounter (Signed)
Thanks for update

## 2021-11-26 NOTE — Telephone Encounter (Signed)
Pt called to inform that she has restarted Geodon 80 mg qhs. Pt will keep appointment for 01/07/22 she says. FYI.

## 2021-11-27 LAB — MICROALBUMIN / CREATININE URINE RATIO
Creatinine, Urine: 111.7 mg/dL
Microalb/Creat Ratio: 12 mg/g creat (ref 0–29)
Microalbumin, Urine: 13.6 ug/mL

## 2021-11-27 LAB — HEMOGLOBIN A1C
Est. average glucose Bld gHb Est-mCnc: 100 mg/dL
Hgb A1c MFr Bld: 5.1 % (ref 4.8–5.6)

## 2021-11-28 ENCOUNTER — Ambulatory Visit (HOSPITAL_COMMUNITY)
Admission: RE | Admit: 2021-11-28 | Discharge: 2021-11-28 | Disposition: A | Discharge status: Home / Self Care | Payer: PPO | Source: Ambulatory Visit | Attending: Nephrology | Admitting: Nephrology

## 2021-11-28 DIAGNOSIS — N1330 Unspecified hydronephrosis: Secondary | ICD-10-CM | POA: Diagnosis not present

## 2021-11-28 DIAGNOSIS — N261 Atrophy of kidney (terminal): Secondary | ICD-10-CM | POA: Diagnosis not present

## 2021-12-02 ENCOUNTER — Telehealth (HOSPITAL_COMMUNITY): Payer: PPO | Admitting: Psychiatry

## 2021-12-05 DIAGNOSIS — N130 Hydronephrosis with ureteropelvic junction obstruction: Secondary | ICD-10-CM | POA: Diagnosis not present

## 2021-12-05 DIAGNOSIS — F5001 Anorexia nervosa, restricting type: Secondary | ICD-10-CM | POA: Diagnosis not present

## 2021-12-05 DIAGNOSIS — F331 Major depressive disorder, recurrent, moderate: Secondary | ICD-10-CM | POA: Diagnosis not present

## 2021-12-05 DIAGNOSIS — N261 Atrophy of kidney (terminal): Secondary | ICD-10-CM | POA: Diagnosis not present

## 2021-12-05 DIAGNOSIS — F411 Generalized anxiety disorder: Secondary | ICD-10-CM | POA: Diagnosis not present

## 2021-12-06 ENCOUNTER — Ambulatory Visit (INDEPENDENT_AMBULATORY_CARE_PROVIDER_SITE_OTHER): Payer: PPO

## 2021-12-06 ENCOUNTER — Ambulatory Visit (INDEPENDENT_AMBULATORY_CARE_PROVIDER_SITE_OTHER): Payer: PPO | Admitting: Orthopaedic Surgery

## 2021-12-06 ENCOUNTER — Other Ambulatory Visit: Payer: Self-pay | Admitting: Urology

## 2021-12-06 ENCOUNTER — Other Ambulatory Visit (HOSPITAL_COMMUNITY): Payer: Self-pay | Admitting: Urology

## 2021-12-06 DIAGNOSIS — G8929 Other chronic pain: Secondary | ICD-10-CM

## 2021-12-06 DIAGNOSIS — M25512 Pain in left shoulder: Secondary | ICD-10-CM

## 2021-12-06 DIAGNOSIS — M25511 Pain in right shoulder: Secondary | ICD-10-CM

## 2021-12-06 DIAGNOSIS — S76012A Strain of muscle, fascia and tendon of left hip, initial encounter: Secondary | ICD-10-CM

## 2021-12-06 DIAGNOSIS — N130 Hydronephrosis with ureteropelvic junction obstruction: Secondary | ICD-10-CM

## 2021-12-06 DIAGNOSIS — M7500 Adhesive capsulitis of unspecified shoulder: Secondary | ICD-10-CM
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
## 2021-12-09 ENCOUNTER — Ambulatory Visit: Payer: Self-pay | Admitting: Family Medicine

## 2021-12-13 ENCOUNTER — Encounter: Payer: Self-pay | Admitting: Family Medicine

## 2021-12-13 NOTE — Telephone Encounter (Signed)
Nurses There is not an easy answer I would recommend office visit with me next week in a open slot Please bring blood pressure cuff with her when she comes  Thanks-Dr. Nicki Reaper

## 2021-12-15 ENCOUNTER — Other Ambulatory Visit (HOSPITAL_COMMUNITY): Payer: Self-pay | Admitting: Psychiatry

## 2021-12-15 DIAGNOSIS — F411 Generalized anxiety disorder: Secondary | ICD-10-CM

## 2021-12-15 DIAGNOSIS — F331 Major depressive disorder, recurrent, moderate: Secondary | ICD-10-CM

## 2021-12-17 ENCOUNTER — Ambulatory Visit (HOSPITAL_COMMUNITY)
Admission: RE | Admit: 2021-12-17 | Discharge: 2021-12-17 | Disposition: A | Discharge status: Home / Self Care | Payer: PPO | Source: Ambulatory Visit | Attending: Urology | Admitting: Urology

## 2021-12-17 DIAGNOSIS — N130 Hydronephrosis with ureteropelvic junction obstruction: Secondary | ICD-10-CM | POA: Insufficient documentation

## 2021-12-17 DIAGNOSIS — N1330 Unspecified hydronephrosis: Secondary | ICD-10-CM | POA: Diagnosis not present

## 2021-12-17 MED ORDER — FUROSEMIDE 10 MG/ML IJ SOLN: 46.0000 mg | Freq: Once | INTRAMUSCULAR | Status: DC

## 2021-12-17 MED ORDER — FUROSEMIDE 10 MG/ML IJ SOLN
INTRAMUSCULAR | Status: AC
  Filled 2021-12-17: qty 8

## 2021-12-17 MED ORDER — TECHNETIUM TC 99M MERTIATIDE
5.4800 | Freq: Once | INTRAVENOUS | Status: AC | PRN
  Administered 2021-12-17: 5.48 via INTRAVENOUS

## 2021-12-19 ENCOUNTER — Ambulatory Visit (INDEPENDENT_AMBULATORY_CARE_PROVIDER_SITE_OTHER): Payer: PPO | Admitting: Family Medicine

## 2021-12-19 ENCOUNTER — Encounter: Payer: Self-pay | Admitting: Family Medicine

## 2021-12-19 VITALS — BP 137/80 | HR 97 | Ht 65.5 in

## 2021-12-19 DIAGNOSIS — F5001 Anorexia nervosa, restricting type: Secondary | ICD-10-CM | POA: Diagnosis not present

## 2021-12-19 DIAGNOSIS — N261 Atrophy of kidney (terminal): Secondary | ICD-10-CM | POA: Diagnosis not present

## 2021-12-19 DIAGNOSIS — F331 Major depressive disorder, recurrent, moderate: Secondary | ICD-10-CM | POA: Diagnosis not present

## 2021-12-19 DIAGNOSIS — F411 Generalized anxiety disorder: Secondary | ICD-10-CM | POA: Diagnosis not present

## 2021-12-19 HISTORY — DX: Atrophy of kidney (terminal): N26.1

## 2021-12-19 NOTE — Progress Notes (Signed)
   Subjective:    Patient ID: Katelyn [name], female    DOB: [birth year], 46 y.o.   MRN: [id number]  HPI Follow up for blood pressure evaluation  Left arm 137/80  right arm 135/78  Patient has had multiple episodes over the past couple weeks of the having some low blood pressure but other blood pressures have been elevated plus also at x1 1 arm is significantly elevated compared to the other she denies any major setbacks but has ongoing troubles with fatigue tiredness dizziness when she stands and some of the blood pressures have been on the low end   Review of Systems     Objective:   Physical Exam Lungs are clear hearts regular pulse normal extremities no edema skin warm dry  She has left kidney atrophy and her right side is doing 85% of the work compared to the left side and there does appear to be some hydronephrosis on the left side she will be seeing urology for this     Assessment & Plan:  The nephrologist would like for her to be on losartan There is concerned that it could be drop in blood pressure associated with this given that some of her readings are on the low end She states she can use a pill cutter to split the losartan in half and do half per day then send [place] some readings within 2 weeks At that point and then can decide whether or not to go up to the full 25 mg

## 2021-12-20 ENCOUNTER — Other Ambulatory Visit: Payer: Self-pay | Admitting: Family Medicine

## 2021-12-20 MED ORDER — LOSARTAN POTASSIUM 25 MG PO TABS: ORAL_TABLET | ORAL | Status: DC

## 2021-12-20 NOTE — Progress Notes (Signed)
Losartan 25 mg 1/2 daily placed on med list.

## 2021-12-27 DIAGNOSIS — F331 Major depressive disorder, recurrent, moderate: Secondary | ICD-10-CM | POA: Diagnosis not present

## 2021-12-27 DIAGNOSIS — F5001 Anorexia nervosa, restricting type: Secondary | ICD-10-CM | POA: Diagnosis not present

## 2021-12-27 DIAGNOSIS — F411 Generalized anxiety disorder: Secondary | ICD-10-CM | POA: Diagnosis not present

## 2021-12-31 DIAGNOSIS — F5001 Anorexia nervosa, restricting type: Secondary | ICD-10-CM | POA: Diagnosis not present

## 2021-12-31 DIAGNOSIS — F411 Generalized anxiety disorder: Secondary | ICD-10-CM | POA: Diagnosis not present

## 2021-12-31 DIAGNOSIS — F331 Major depressive disorder, recurrent, moderate: Secondary | ICD-10-CM | POA: Diagnosis not present

## 2022-01-03 DIAGNOSIS — N182 Chronic kidney disease, stage 2 (mild): Secondary | ICD-10-CM | POA: Diagnosis not present

## 2022-01-06 ENCOUNTER — Telehealth (HOSPITAL_COMMUNITY): Payer: PPO | Admitting: Psychiatry

## 2022-01-07 ENCOUNTER — Encounter (HOSPITAL_COMMUNITY): Payer: Self-pay | Admitting: Psychiatry

## 2022-01-07 ENCOUNTER — Telehealth (HOSPITAL_BASED_OUTPATIENT_CLINIC_OR_DEPARTMENT_OTHER): Payer: PPO | Admitting: Psychiatry

## 2022-01-07 DIAGNOSIS — F509 Eating disorder, unspecified: Secondary | ICD-10-CM | POA: Diagnosis not present

## 2022-01-07 DIAGNOSIS — F331 Major depressive disorder, recurrent, moderate: Secondary | ICD-10-CM | POA: Diagnosis not present

## 2022-01-07 DIAGNOSIS — F411 Generalized anxiety disorder: Secondary | ICD-10-CM

## 2022-01-07 DIAGNOSIS — F5101 Primary insomnia: Secondary | ICD-10-CM | POA: Diagnosis not present

## 2022-01-07 MED ORDER — BUPROPION HCL 75 MG PO TABS: ORAL_TABLET | ORAL | 0 refills | Status: AC

## 2022-01-07 MED ORDER — TEMAZEPAM 30 MG PO CAPS: 30.0000 mg | ORAL_CAPSULE | Freq: Every day | ORAL | 0 refills | Status: AC

## 2022-01-07 MED ORDER — ZIPRASIDONE HCL 80 MG PO CAPS: 80.0000 mg | ORAL_CAPSULE | Freq: Every day | ORAL | 0 refills | Status: AC

## 2022-01-07 NOTE — Progress Notes (Signed)
Virtual Visit via Telephone Note  I connected with Katelyn [name] on 01/07/22 at  8:20 AM EDT by telephone and verified that I am speaking with the correct person using two identifiers.  Location: Patient: Home  Provider: Home Office   I discussed the limitations, risks, security and privacy concerns of performing an evaluation and management service by telephone and the availability of in person appointments. I also discussed with the patient that there may be a patient responsible charge related to this service. The patient expressed understanding and agreed to proceed.   History of Present Illness: Patient is evaluated by phone session.  She endorsed a lot of stress and anxiety.  Recently she started taking Geodon at bedtime and that helps the paranoia and sleep but does not have too many things going on in her life.  She feels a lot of pressure from her therapist to see the nurse practitioner that there office at [facility].  She did saw a nurse practitioner Katelyn [name] and now she is taking Xanax and they have stopped the Klonopin.  She does not want to continue bed management there but she is afraid that she may lose her therapist Katelyn [name].  Patient told that she is with this Probation officer for more than 10 years and she trust this Probation officer but not sure what to do.  She also have a lot of other psychosocial issues.  Her son is now using marijuana and he tried to come out but having a lot of nausea and he was seen in the emergency room.  Patient reported some still have a lot of anger issues.  Patient cannot drive and her husband drives her to the appointments.  Recently she has seen nephrologist and found that she have hydronephrosis because one of the kidney is not working and second kidney is enlarged.  She was told at some point the kidney which is not working may need to remove.  Started to have crying spells when she is talking about her issues.  She also reported her daughter is seeing at  the same place where she is going for a therapy.  She feels pressured that she need to move her treatment at same place.  Patient told that she does not drive and her husband has to take the time off for the appointments because the new nurse practitioner wants to visit in person.  She reported having few times panic attack.  She does not feel that Xanax helping which she is taking 0.5 mg 2 times a day.  She is also taking gabapentin, Percocet, muscle relaxant.  She reported ruminative thoughts and still have some time paranoia but denies any suicidal thoughts or homicidal thoughts.  She worries about her chronic health and now worried about her children's health.  Her daughter recently had endoscopy and colonoscopy.  Patient feels sometime much burden to her husband who is taking all the family members to the doctor's appointments.  Patient cannot drive because of her neuropathy and hip pain.  Patient denies drinking or using any illegal substances.  Her appetite is fair.  She denies any impulsive eating or purging.  Her weight is unchanged from the past.  Recently she had a visit with her PCP and taking losartan to protect the kidney.   Past Psychiatric History:  H/O multiple inpatient. Last admission Feb 2015.  Good response with ECT but scared to continue maintenance ECT.  Tried Cymbalta (suicidal thoughts), Lexapro, Prozac (sexual side effects), Abilify, lithium,  Lamictal, Ritalin, Remeron, Risperdal,Vistaril, BuSpar and Valium.  Recent Results (from the past 2160 hour(s))  Hemoglobin A1C     Status: None   Collection Time: 11/26/21  7:39 AM  Result Value Ref Range   Hgb A1c MFr Bld 5.1 4.8 - 5.6 %    Comment:          Prediabetes: 5.7 - 6.4          Diabetes: >6.4          Glycemic control for adults with diabetes: <7.0    Est. average glucose Bld gHb Est-mCnc 100 mg/dL  Microalbumin/Creatinine Ratio, Urine     Status: None   Collection Time: 11/26/21  7:39 AM  Result Value Ref Range    Creatinine, Urine 111.7 Not Estab. mg/dL   Microalbumin, Urine 13.6 Not Estab. ug/mL   Microalb/Creat Ratio 12 0 - 29 mg/g creat    Comment:                        Normal:                0 -  29                        Moderately increased: 30 - 300                        Severely increased:       >300      Psychiatric Specialty Exam: Physical Exam  Review of Systems  Neurological:  Positive for numbness.    Weight 203 lb (92.1 kg).There is no height or weight on file to calculate BMI.  General Appearance: NA  Eye Contact:  NA  Speech:  Slow  Volume:  Decreased  Mood:  Anxious, Depressed, and Dysphoric  Affect:  NA  Thought Process:  Descriptions of Associations: Intact  Orientation:  Full (Time, Place, and Person)  Thought Content:  Paranoid Ideation and Rumination  Suicidal Thoughts:  No  Homicidal Thoughts:  No  Memory:  Immediate;   Good Recent;   Good Remote;   Good  Judgement:  Intact  Insight:  Present  Psychomotor Activity:  NA  Concentration:  Concentration: Fair and Attention Span: Fair  Recall:  Faith of Knowledge:  Good  Language:  Good  Akathisia:  No  Handed:  Right  AIMS (if indicated):     Assets:  Communication Skills Desire for Improvement Housing Social Support  ADL's:  Intact  Cognition:  WNL  Sleep:   better with Geodon.      Assessment and Plan: Generalized anxiety disorder.  Primary insomnia.  Eating disorder.  Major depressive disorder, recurrent.  I reviewed blood work results.  Her kidney function tests are normal.  I reviewed notes from primary care doctor.  She is taking losartan to protect the kidney.  She is under a lot of stress as does not want to switch the provider but feels pressured from the therapist to switch provider for medication management at [facility], her daughter also go there and she feels if she does not go there then it will effect her therapy and her daughters treatment plan.  She is taking Xanax and not  taking Klonopin as she had a visit there by nurse practitioner Katelyn [name].  I recommend that is okay to get second opinion and should see the nurse practitioner there but given  the complex history, medical issues and prior treatment failure should be addressed and discussed in detail with the therapist.  She does not want to close the case from our office and I promise that if she did not have good response from the treatment plan provided by provider there then she is okay to come back.  We discussed about the difference between Xanax and Klonopin.  She prefer Klonopin but like to try Xanax until her next appointment with her nurse practitioner in 3 to 4 weeks.  No other changes in the medication.  I encourage continue Geodon which is helping her paranoia and sleep.  Continue Wellbutrin 75 mg 2 tablet daily, temazepam 30 mg at bedtime.  Discussed safety concerns at any time having active suicidal thoughts or homicidal thought then she need to call 911 or go to local emergency room.  Patient will call us if she need future appointments.  I recommend if she wants her records to be transferred there then we will need her consent and I am happy to forward her record to the new provider.  Follow Up Instructions:    I discussed the assessment and treatment plan with the patient. The patient was provided an opportunity to ask questions and all were answered. The patient agreed with the plan and demonstrated an understanding of the instructions.   The patient was advised to call back or seek an in-person evaluation if the symptoms worsen or if the condition fails to improve as anticipated.  Collaboration of Care: Other provider involved in patient's care AEB notes are available in epic to review.  Patient/Guardian was advised Release of Information must be obtained prior to any record release in order to collaborate their care with an outside provider. Patient/Guardian was advised if they have not already  done so to contact the registration department to sign all necessary forms in order for [place] to release information regarding their care.   Consent: Patient/Guardian gives verbal consent for treatment and assignment of benefits for services provided during this visit. Patient/Guardian expressed understanding and agreed to proceed.    I provided 30 minutes of non-face-to-face time during this encounter.   Kathlee Nations, MD

## 2022-01-07 NOTE — Addendum Note (Signed)
Addended by: Berniece Andreas T on: 01/07/2022 10:24 AM   Modules accepted: Level of Service

## 2022-01-09 DIAGNOSIS — N130 Hydronephrosis with ureteropelvic junction obstruction: Secondary | ICD-10-CM | POA: Diagnosis not present

## 2022-01-09 DIAGNOSIS — R3915 Urgency of urination: Secondary | ICD-10-CM | POA: Diagnosis not present

## 2022-01-14 DIAGNOSIS — F5001 Anorexia nervosa, restricting type: Secondary | ICD-10-CM | POA: Diagnosis not present

## 2022-01-14 DIAGNOSIS — F331 Major depressive disorder, recurrent, moderate: Secondary | ICD-10-CM | POA: Diagnosis not present

## 2022-01-14 DIAGNOSIS — F411 Generalized anxiety disorder: Secondary | ICD-10-CM | POA: Diagnosis not present

## 2022-01-17 ENCOUNTER — Inpatient Hospital Stay: Payer: PPO | Attending: Physician Assistant

## 2022-01-17 DIAGNOSIS — G2581 Restless legs syndrome: Secondary | ICD-10-CM | POA: Diagnosis not present

## 2022-01-17 DIAGNOSIS — K912 Postsurgical malabsorption, not elsewhere classified: Secondary | ICD-10-CM | POA: Diagnosis not present

## 2022-01-17 DIAGNOSIS — D508 Other iron deficiency anemias: Secondary | ICD-10-CM | POA: Insufficient documentation

## 2022-01-17 DIAGNOSIS — E538 Deficiency of other specified B group vitamins: Secondary | ICD-10-CM | POA: Diagnosis not present

## 2022-01-17 DIAGNOSIS — Z9884 Bariatric surgery status: Secondary | ICD-10-CM | POA: Insufficient documentation

## 2022-01-17 LAB — COMPREHENSIVE METABOLIC PANEL
ALT: 24 U/L (ref 0–44)
AST: 20 U/L (ref 15–41)
Albumin: 4.2 g/dL (ref 3.5–5.0)
Alkaline Phosphatase: 126 U/L (ref 38–126)
Anion gap: 8 (ref 5–15)
BUN: 15 mg/dL (ref 6–20)
CO2: 25 mmol/L (ref 22–32)
Calcium: 9.1 mg/dL (ref 8.9–10.3)
Chloride: 106 mmol/L (ref 98–111)
Creatinine, Ser: 0.64 mg/dL (ref 0.44–1.00)
GFR, Estimated: >60 mL/min (ref >60)
Glucose, Bld: 94 mg/dL (ref 70–99)
Potassium: 4.3 mmol/L (ref 3.5–5.1)
Sodium: 139 mmol/L (ref 135–145)
Total Bilirubin: 0.6 mg/dL (ref 0.3–1.2)
Total Protein: 7.4 g/dL (ref 6.5–8.1)

## 2022-01-17 LAB — CBC WITH DIFFERENTIAL/PLATELET
Abs Immature Granulocytes: 0.07 10*3/uL (ref 0.00–0.07)
Basophils Absolute: 0.1 10*3/uL (ref 0.0–0.1)
Basophils Relative: 1 %
Eosinophils Absolute: 0.2 10*3/uL (ref 0.0–0.5)
Eosinophils Relative: 2 %
HCT: 43.5 % (ref 36.0–46.0)
Hemoglobin: 14.2 g/dL (ref 12.0–15.0)
Immature Granulocytes: 1 %
Lymphocytes Relative: 19 %
Lymphs Abs: 2.1 10*3/uL (ref 0.7–4.0)
MCH: 31.9 pg (ref 26.0–34.0)
MCHC: 32.6 g/dL (ref 30.0–36.0)
MCV: 97.8 fL (ref 80.0–100.0)
Monocytes Absolute: 0.6 10*3/uL (ref 0.1–1.0)
Monocytes Relative: 6 %
Neutro Abs: 8 10*3/uL — ABNORMAL HIGH (ref 1.7–7.7)
Neutrophils Relative %: 71 %
Platelets: 353 10*3/uL (ref 150–400)
RBC: 4.45 MIL/uL (ref 3.87–5.11)
RDW: 12.6 % (ref 11.5–15.5)
WBC: 11.1 10*3/uL — ABNORMAL HIGH (ref 4.0–10.5)
nRBC: 0 % (ref 0.0–0.2)

## 2022-01-17 LAB — FERRITIN: Ferritin: 62 ng/mL (ref 11–307)

## 2022-01-17 LAB — IRON AND TIBC
Iron: 38 ug/dL (ref 28–170)
Saturation Ratios: 11 % (ref 10.4–31.8)
TIBC: 331 ug/dL (ref 250–450)
UIBC: 293 ug/dL

## 2022-01-23 ENCOUNTER — Encounter (HOSPITAL_BASED_OUTPATIENT_CLINIC_OR_DEPARTMENT_OTHER): Payer: Self-pay | Admitting: Physical Therapy

## 2022-01-23 ENCOUNTER — Other Ambulatory Visit: Payer: Self-pay

## 2022-01-23 ENCOUNTER — Ambulatory Visit (HOSPITAL_BASED_OUTPATIENT_CLINIC_OR_DEPARTMENT_OTHER): Payer: PPO | Attending: Orthopaedic Surgery | Admitting: Physical Therapy

## 2022-01-23 DIAGNOSIS — M25511 Pain in right shoulder: Secondary | ICD-10-CM | POA: Diagnosis not present

## 2022-01-23 DIAGNOSIS — S76012A Strain of muscle, fascia and tendon of left hip, initial encounter: Secondary | ICD-10-CM | POA: Diagnosis not present

## 2022-01-23 DIAGNOSIS — M25552 Pain in left hip: Secondary | ICD-10-CM | POA: Diagnosis not present

## 2022-01-23 DIAGNOSIS — G8929 Other chronic pain: Secondary | ICD-10-CM | POA: Diagnosis not present

## 2022-01-23 DIAGNOSIS — M25512 Pain in left shoulder: Secondary | ICD-10-CM | POA: Diagnosis not present

## 2022-01-23 NOTE — Therapy (Signed)
OUTPATIENT PHYSICAL THERAPY LOWER EXTREMITY EVALUATION   Patient Name: Katelyn [name] MRN: [id number] DOB:1975/06/25, 46 y.o., female Today's Date: 01/23/2022   PT End of Session - 01/23/22 1345     Visit Number 1    Number of Visits 15    Date for PT Re-Evaluation 04/26/22    Authorization Type HTA    PT Start Time 1343    PT Stop Time 1430    PT Time Calculation (min) 47 min    Activity Tolerance Patient tolerated treatment well;Patient limited by pain    Behavior During Therapy [facility] for tasks assessed/performed             Past Medical History:  Diagnosis Date   Anemia    Anorexia    Anxiety    Asthma    controlled with meds   Benign juvenile melanoma    Chronic headaches    Colon polyps    found on colonoscopy 28/78/6767   Complication of anesthesia    itching after epidural for c section   Constipation    Depression    Depression    several suicide attempts, hospitaluzed in 2012 for this; hx pf ECT treatments ; pt sees Dr. Adele Schilder pyschiatrist  and doing well on medication   Dysrhythmia    hx of PVC's (pt hasn't seen cardiologist since 2021- no follow up needed)   GERD (gastroesophageal reflux disease)    Headache    Heart murmur    as a child - no one mentions hearing murmur anymore, sometimes it is heard. Echo has been performed 02/03/18   Heartburn    no meds   History of pneumonia    x 3  years ago - no recent problems   Hx of blood clots    Left renal atrophy 12/19/2021   Neuromuscular disorder (HCC)    small fiber neuropathy   Obesity    Pneumonia    Polycystic ovary    takes metformin to treat   Renal atrophy, left 2023   Skin lesion    Excisional biopsy of moles - none cancerous   Sleep apnea    yrs ago - diagnosed mild sleep apnea - did not have to use cpap    Past Surgical History:  Procedure Laterality Date   BREAST CAPSULECTOMY Bilateral 12/13/2020   1 week later pt had hematoma surgery on left breast   BREAST ENHANCEMENT SURGERY  Bilateral 02/11/2017   BREATH TEK H PYLORI N/A 07/22/2013   Procedure: BREATH TEK H PYLORI;  Surgeon: Edward Jolly, MD;  Location: [facility];  Service: General;  Laterality: N/A;   CESAREAN SECTION  2004, 2007   x 2   COLONOSCOPY  2013   CYSTOSCOPY WITH URETHRAL DILATATION  age 55    GASTRIC ROUX-EN-Y N/A 10/31/2013   Procedure: LAPAROSCOPIC ROUX-EN-Y GASTRIC BYPASS WITH UPPER ENDOSCOPY;  Surgeon: Edward Jolly, MD;  Location: [facility];  Service: General;  Laterality: N/A;   GLUTEUS MINIMUS REPAIR Left 05/28/2021   Procedure: Left [place];  Surgeon: Vanetta Mulders, MD;  Location: [place];  Service: Orthopedics;  Laterality: Left;   ingrown toenail Right    Ingrown nail on great right toe   kidney stent  08/28/2014   mole excision     "benign juvenile melanoma" removed from left leg - inner thigh   POLYPECTOMY     2013   SKIN LESION EXCISION     back   TONSILLECTOMY  age 57  TUBAL LIGATION  2018   Patient Active Problem List   Diagnosis Date Noted   Left renal atrophy 12/19/2021   Meningitis 09/27/2021   Epigastric pain 09/27/2021   GERD (gastroesophageal reflux disease) 09/27/2021   Rash and nonspecific skin eruption 09/27/2021   Hypocalcemia 09/27/2021   Chronic pain syndrome 09/27/2021   Sepsis ([place])    Fever 09/25/2021   Tear of left gluteus medius tendon    Asthma 07/18/2020   Vitamin B 12 deficiency 10/16/2019   Elevated LFTs 10/16/2019   Asthma, exogenous, unspecified asthma severity, uncomplicated 48/18/5631   B12 deficiency anemia 06/21/2018   Venous insufficiency 04/23/2018   Onychomycosis of toenail 04/17/2018   Chronic myofascial pain 03/18/2018   Small fiber neuropathy 03/18/2018   Idiopathic small fiber sensory neuropathy 02/23/2018   Varicose veins of both lower extremities 01/13/2018   Cervical spondylosis without myelopathy 08/28/2017   Migraine with aura and without status migrainosus, not intractable 07/22/2017   Lumbar  degenerative disc disease 07/20/2017   Spondylosis of lumbar spine 07/20/2017   Coccydynia 03/12/2017   Bariatric surgery status 05/02/2016   Major depressive disorder, recurrent episode, moderate ([place]) 02/18/2016   Tachy-brady syndrome ([place]) 12/19/2015   Iron deficiency anemia 06/10/2015   Sleep disorder 06/01/2013   Generalized anxiety disorder 05/30/2013   Chronic headaches 07/01/2012   Headache 07/01/2012   Depression, major, recurrent, severe with psychosis ([place]) 06/17/2012   Severe recurrent major depressive disorder with psychotic symptoms ([place]) 06/17/2012   Family history of colonic polyps 03/03/2012   Eating disorder 12/18/2011   Nausea & vomiting 01/22/2010   Polycystic ovarian syndrome 09/01/2008   Intermittent left-sided chest pain 08/02/2008    REFERRING PROVIDER: Vanetta Mulders, MD  REFERRING DIAG:  M25.511,G89.29,M25.512 (ICD-10-CM) - Chronic pain of both shoulders  S76.012A (ICD-10-CM) - Tear of left gluteus medius tendon, initial encounter  B/L shoulder program ROM and strenghening  L hip strengthening program  THERAPY DIAG:  Pain in left hip  Chronic left shoulder pain  Acute pain of right shoulder  Rationale for Evaluation and Treatment Rehabilitation  ONSET DATE: most recently June 2023  SUBJECTIVE:   SUBJECTIVE STATEMENT: June was admitted for Meningitis, after dx both shoulders started hurting and resulted in adhesive capsulitis. Unable to push in bed mobility with left arm due to stabbing pain after laying on it, can use right okay. Sleeping has been awful. I like to sleep prone but when I turn my neck the opp shoulder hurts.  Laying on left hip still really hurts. It's not as much pain as it is aching and buttock region. It was hurting quite a bit around incision. Stairs are really hard. Walking from parking lot to the clinic okay but cannot tolerate long distances.  Still cannot tolerate sitting more than 30 min. I feel the tailbone too much.    PERTINENT HISTORY: Sensory neuropathy, chronic pain, MDD, DDD  PAIN:  Are you having pain? Yes: NPRS scale: 7-shoulder, 3-hip/10 Pain location: bil shoulder, Lt hip Pain description: see subj Aggravating factors: see subj Relieving factors: see subj  PRECAUTIONS: Other: sensory neropathy Does not tolerate seated or supine positioning well.   WEIGHT BEARING RESTRICTIONS No  FALLS:  Has patient fallen in last 6 months? No  LIVING ENVIRONMENT: Lives with: lives with their family  OCCUPATION: not working  PLOF: Independent with basic ADLs and Independent with community mobility without device  PATIENT GOALS decr pain, incr ability and stamina, out of the bed more, up steps, dressing  OBJECTIVE:   DIAGNOSTIC FINDINGS: bil shoulder xrays unremarkable on 12/06/21  PATIENT SURVEYS:  FOTO Hip 40, Shoulder 45  COGNITION:  Overall cognitive status: Within functional limits for tasks assessed     SENSATION: Lt UE neuropathy-painful to touch, light sensation in Rt UE LLE neuropathy, both feet have a little bit  POSTURE: weight off of LLE  PALPATION: TTP at Lt hip surgical site  LOWER EXTREMITY ROM:  Active ROM- in standing Right eval Left eval  Hip flexion 78 74  Hip extension 14 20  Hip abduction 20 18   (Blank rows = not tested)  LOWER EXTREMITY MMT:  MMT Right eval Left eval  Hip flexion    Hip extension    Hip abduction    Hip adduction    Hip internal rotation    Hip external rotation    Knee flexion    Knee extension    Ankle dorsiflexion    Ankle plantarflexion    Ankle inversion    Ankle eversion     (Blank rows = not tested)   FUNCTIONAL TESTS:  6MWT 1033 ft    RPE: 8/10; pain up to 8/10 on both sides (Lt more than Rt)  GAIT: Distance walked: Rt trendelenburg- pt verbalizes sensation of instability Assistive device utilized: none at eval  AROM Right EVAL  Left EVAL  Shoulder flexion 110 112  Shoulder extension 26 22  Shoulder  abduction 108 100  Shoulder adduction    Shoulder internal rotation Below Sacrum mid buttock Just past neutral not behind body  Shoulder external rotation t2 c5  (Blank rows = not tested)  UEROM: Unable to hook bra behind back Difficulty shaving under arms, brushing hair, crossing arms hurts, unable to reach over either shoulder. *pt verbalized numbness in feet with end range overpressure in standing shoulder flexion   TODAY'S TREATMENT: Treatment                            01/23/22: Scap retraction Pulleys Table L stretch for shoulder flexion IR stretch with strap Glut set Standing hip abd Heel raises    PATIENT EDUCATION:  Education details: Anatomy of condition, POC, HEP, exercise form/rationale Person educated: Patient Education method: Explanation, Demonstration, Tactile cues, Verbal cues, and Handouts Education comprehension: verbalized understanding, returned demonstration, verbal cues required, tactile cues required, and needs further education   HOME EXERCISE PROGRAM: [id number]  ASSESSMENT:  CLINICAL IMPRESSION: Patient is a 46 y.o. F who was seen today for physical therapy evaluation and treatment for Lt hip pain 8 mo s/p Lt glut med repair and bilateral adhesive capsulitis which is suspected to be the result of meningitis.    OBJECTIVE IMPAIRMENTS decreased activity tolerance, difficulty walking, decreased ROM, decreased strength, increased muscle spasms, impaired flexibility, impaired sensation, improper body mechanics, postural dysfunction, and pain.   ACTIVITY LIMITATIONS carrying, lifting, bending, sitting, standing, squatting, sleeping, stairs, transfers, bed mobility, bathing, toileting, dressing, reach over head, hygiene/grooming, locomotion level, and caring for others  PARTICIPATION LIMITATIONS: meal prep, cleaning, laundry, driving, shopping, and community activity  PERSONAL FACTORS 3+ comorbidities: see PMH  are also affecting patient's functional  outcome.   REHAB POTENTIAL: Good  CLINICAL DECISION MAKING: Evolving/moderate complexity  EVALUATION COMPLEXITY: Moderate   GOALS: Goals reviewed with patient? Yes  SHORT TERM GOALS: Target date: 02/21/2022  GHJ IR to midline bilaterally Baseline: Goal status: INITIAL  2.  Pt will verbalize awareness of equal WB and more frequent corrections Baseline:  Goal status: INITIAL  3.  Independent in HEP as it has been established in the short term Baseline:  Goal status: INITIAL   LONG TERM GOALS: Target date: 03/28/22  Pt will meet FOTO goals for hip and shoulder Baseline:  Goal status: INITIAL  2.  6 MWT to distance greater than 50-59 age normative values Baseline:  Goal status: INITIAL  3.  Able to fix hair independently pain <=3/10 Baseline:  Goal status: INITIAL  4.  Able to dress with minimal difficulty for shoulders and hip Baseline:  Goal status: INITIAL  5.  Able to navigate stairs with reciprocal pattern Baseline:  Goal status: INITIAL  6.  Able to tolerate ADLs without requiring long periods in her bed due to pain Baseline:  Goal status: INITIAL   PLAN: PT FREQUENCY: 1-2x/week  PT DURATION: 12 weeks  PLANNED INTERVENTIONS: Therapeutic exercises, Therapeutic activity, Neuromuscular re-education, Balance training, Gait training, Patient/Family education, Self Care, Joint mobilization, Stair training, Aquatic Therapy, Dry Needling, Electrical stimulation, Spinal mobilization, Cryotherapy, Moist heat, Taping, Manual therapy, and Re-evaluation  PLAN FOR NEXT SESSION: continue GHJ ROM as tolerated, gross lumbopelvic strength   Tyana Butzer C. Dalton Molesworth PT, DPT 01/23/22 9:30 PM

## 2022-01-24 ENCOUNTER — Inpatient Hospital Stay (HOSPITAL_BASED_OUTPATIENT_CLINIC_OR_DEPARTMENT_OTHER): Payer: PPO | Admitting: Physician Assistant

## 2022-01-24 ENCOUNTER — Other Ambulatory Visit: Payer: Self-pay

## 2022-01-24 ENCOUNTER — Encounter: Payer: Self-pay | Admitting: Physician Assistant

## 2022-01-24 DIAGNOSIS — R7989 Other specified abnormal findings of blood chemistry: Secondary | ICD-10-CM | POA: Diagnosis not present

## 2022-01-24 DIAGNOSIS — D508 Other iron deficiency anemias: Secondary | ICD-10-CM

## 2022-01-24 DIAGNOSIS — D513 Other dietary vitamin B12 deficiency anemia: Secondary | ICD-10-CM | POA: Diagnosis not present

## 2022-01-24 NOTE — Therapy (Deleted)
OUTPATIENT PHYSICAL THERAPY LOWER EXTREMITY EVALUATION   Patient Name: Katelyn [name] MRN: [id number] DOB:1975/07/29, 46 y.o., female Today's Date: 01/24/2022     Past Medical History:  Diagnosis Date   Anemia    Anorexia    Anxiety    Asthma    controlled with meds   Benign juvenile melanoma    Chronic headaches    Colon polyps    found on colonoscopy 63/14/9702   Complication of anesthesia    itching after epidural for c section   Constipation    Depression    Depression    several suicide attempts, hospitaluzed in 2012 for this; hx pf ECT treatments ; pt sees Dr. Adele Schilder pyschiatrist  and doing well on medication   Dysrhythmia    hx of PVC's (pt hasn't seen cardiologist since 2021- no follow up needed)   GERD (gastroesophageal reflux disease)    Headache    Heart murmur    as a child - no one mentions hearing murmur anymore, sometimes it is heard. Echo has been performed 02/03/18   Heartburn    no meds   History of pneumonia    x 3  years ago - no recent problems   Hx of blood clots    Left renal atrophy 12/19/2021   Neuromuscular disorder (HCC)    small fiber neuropathy   Obesity    Pneumonia    Polycystic ovary    takes metformin to treat   Renal atrophy, left 2023   Skin lesion    Excisional biopsy of moles - none cancerous   Sleep apnea    yrs ago - diagnosed mild sleep apnea - did not have to use cpap    Past Surgical History:  Procedure Laterality Date   BREAST CAPSULECTOMY Bilateral 12/13/2020   1 week later pt had hematoma surgery on left breast   BREAST ENHANCEMENT SURGERY Bilateral 02/11/2017   BREATH TEK H PYLORI N/A 07/22/2013   Procedure: BREATH TEK H PYLORI;  Surgeon: Edward Jolly, MD;  Location: [facility];  Service: General;  Laterality: N/A;   CESAREAN SECTION  2004, 2007   x 2   COLONOSCOPY  2013   CYSTOSCOPY WITH URETHRAL DILATATION  age 11    GASTRIC ROUX-EN-Y N/A 10/31/2013   Procedure: LAPAROSCOPIC ROUX-EN-Y GASTRIC  BYPASS WITH UPPER ENDOSCOPY;  Surgeon: Edward Jolly, MD;  Location: [facility];  Service: General;  Laterality: N/A;   GLUTEUS MINIMUS REPAIR Left 05/28/2021   Procedure: Left [place];  Surgeon: Vanetta Mulders, MD;  Location: [place];  Service: Orthopedics;  Laterality: Left;   ingrown toenail Right    Ingrown nail on great right toe   kidney stent  08/28/2014   mole excision     "benign juvenile melanoma" removed from left leg - inner thigh   POLYPECTOMY     2013   SKIN LESION EXCISION     back   TONSILLECTOMY  age 51   TUBAL LIGATION  2018   Patient Active Problem List   Diagnosis Date Noted   Left renal atrophy 12/19/2021   Meningitis 09/27/2021   Epigastric pain 09/27/2021   GERD (gastroesophageal reflux disease) 09/27/2021   Rash and nonspecific skin eruption 09/27/2021   Hypocalcemia 09/27/2021   Chronic pain syndrome 09/27/2021   Sepsis ([place])    Fever 09/25/2021   Tear of left gluteus medius tendon    Asthma 07/18/2020   Vitamin B 12 deficiency 10/16/2019   Elevated LFTs 10/16/2019  Asthma, exogenous, unspecified asthma severity, uncomplicated 27/06/5007   B12 deficiency anemia 06/21/2018   Venous insufficiency 04/23/2018   Onychomycosis of toenail 04/17/2018   Chronic myofascial pain 03/18/2018   Small fiber neuropathy 03/18/2018   Idiopathic small fiber sensory neuropathy 02/23/2018   Varicose veins of both lower extremities 01/13/2018   Cervical spondylosis without myelopathy 08/28/2017   Migraine with aura and without status migrainosus, not intractable 07/22/2017   Lumbar degenerative disc disease 07/20/2017   Spondylosis of lumbar spine 07/20/2017   Coccydynia 03/12/2017   Bariatric surgery status 05/02/2016   Major depressive disorder, recurrent episode, moderate ([place]) 02/18/2016   Tachy-brady syndrome ([place]) 12/19/2015   Iron deficiency anemia 06/10/2015   Sleep disorder 06/01/2013   Generalized anxiety disorder 05/30/2013   Chronic  headaches 07/01/2012   Headache 07/01/2012   Depression, major, recurrent, severe with psychosis ([employer]) 06/17/2012   Severe recurrent major depressive disorder with psychotic symptoms ([place]) 06/17/2012   Family history of colonic polyps 03/03/2012   Eating disorder 12/18/2011   Nausea & vomiting 01/22/2010   Polycystic ovarian syndrome 09/01/2008   Intermittent left-sided chest pain 08/02/2008    REFERRING PROVIDER: Vanetta Mulders, MD  REFERRING DIAG:  M25.511,G89.29,M25.512 (ICD-10-CM) - Chronic pain of both shoulders  S76.012A (ICD-10-CM) - Tear of left gluteus medius tendon, initial encounter  B/L shoulder program ROM and strenghening  L hip strengthening program  THERAPY DIAG:  No diagnosis found.  Rationale for Evaluation and Treatment Rehabilitation  ONSET DATE: most recently June 2023  SUBJECTIVE:   SUBJECTIVE STATEMENT: June was admitted for Meningitis, after dx both shoulders started hurting and resulted in adhesive capsulitis. Unable to push in bed mobility with left arm due to stabbing pain after laying on it, can use right okay. Sleeping has been awful. I like to sleep prone but when I turn my neck the opp shoulder hurts.  Laying on left hip still really hurts. It's not as much pain as it is aching and buttock region. It was hurting quite a bit around incision. Stairs are really hard. Walking from parking lot to the clinic okay but cannot tolerate long distances.  Still cannot tolerate sitting more than 30 min. I feel the tailbone too much.   PERTINENT HISTORY: Sensory neuropathy, chronic pain, MDD, DDD  PAIN:  Are you having pain? Yes: NPRS scale: 7-shoulder, 3-hip/10 Pain location: bil shoulder, Lt hip Pain description: see subj Aggravating factors: see subj Relieving factors: see subj  PRECAUTIONS: Other: sensory neropathy Does not tolerate seated or supine positioning well.   WEIGHT BEARING RESTRICTIONS No  FALLS:  Has patient fallen in last 6 months?  No  LIVING ENVIRONMENT: Lives with: lives with their family  OCCUPATION: not working  PLOF: Independent with basic ADLs and Independent with community mobility without device  PATIENT GOALS decr pain, incr ability and stamina, out of the bed more, up steps, dressing    OBJECTIVE:   DIAGNOSTIC FINDINGS: bil shoulder xrays unremarkable on 12/06/21  PATIENT SURVEYS:  FOTO Hip 40, Shoulder 45  COGNITION:  Overall cognitive status: Within functional limits for tasks assessed     SENSATION: Lt UE neuropathy-painful to touch, light sensation in Rt UE LLE neuropathy, both feet have a little bit  POSTURE: weight off of LLE  PALPATION: TTP at Lt hip surgical site  LOWER EXTREMITY ROM:  Active ROM- in standing Right eval Left eval  Hip flexion 78 74  Hip extension 14 20  Hip abduction 20 18   (Blank rows = not  tested)  LOWER EXTREMITY MMT:  MMT Right eval Left eval  Hip flexion    Hip extension    Hip abduction    Hip adduction    Hip internal rotation    Hip external rotation    Knee flexion    Knee extension    Ankle dorsiflexion    Ankle plantarflexion    Ankle inversion    Ankle eversion     (Blank rows = not tested)   FUNCTIONAL TESTS:  6MWT 1033 ft    RPE: 8/10; pain up to 8/10 on both sides (Lt more than Rt)  GAIT: Distance walked: Rt trendelenburg- pt verbalizes sensation of instability Assistive device utilized: none at eval  AROM Right EVAL  Left EVAL  Shoulder flexion 110 112  Shoulder extension 26 22  Shoulder abduction 108 100  Shoulder adduction    Shoulder internal rotation Below Sacrum mid buttock Just past neutral not behind body  Shoulder external rotation t2 c5  (Blank rows = not tested)  UEROM: Unable to hook bra behind back Difficulty shaving under arms, brushing hair, crossing arms hurts, unable to reach over either shoulder. *pt verbalized numbness in feet with end range overpressure in standing shoulder  flexion   TODAY'S TREATMENT: Treatment                            01/23/22: Scap retraction Pulleys Table L stretch for shoulder flexion IR stretch with strap Glut set Standing hip abd Heel raises    PATIENT EDUCATION:  Education details: Anatomy of condition, POC, HEP, exercise form/rationale Person educated: Patient Education method: Explanation, Demonstration, Tactile cues, Verbal cues, and Handouts Education comprehension: verbalized understanding, returned demonstration, verbal cues required, tactile cues required, and needs further education   HOME EXERCISE PROGRAM: [id number]  ASSESSMENT:  CLINICAL IMPRESSION: Patient is a 46 y.o. F who was seen today for physical therapy evaluation and treatment for Lt hip pain 8 mo s/p Lt glut med repair and bilateral adhesive capsulitis which is suspected to be the result of meningitis.    OBJECTIVE IMPAIRMENTS decreased activity tolerance, difficulty walking, decreased ROM, decreased strength, increased muscle spasms, impaired flexibility, impaired sensation, improper body mechanics, postural dysfunction, and pain.   ACTIVITY LIMITATIONS carrying, lifting, bending, sitting, standing, squatting, sleeping, stairs, transfers, bed mobility, bathing, toileting, dressing, reach over head, hygiene/grooming, locomotion level, and caring for others  PARTICIPATION LIMITATIONS: meal prep, cleaning, laundry, driving, shopping, and community activity  PERSONAL FACTORS 3+ comorbidities: see PMH  are also affecting patient's functional outcome.   REHAB POTENTIAL: Good  CLINICAL DECISION MAKING: Evolving/moderate complexity  EVALUATION COMPLEXITY: Moderate   GOALS: Goals reviewed with patient? Yes  SHORT TERM GOALS: Target date: 02/21/2022  GHJ IR to midline bilaterally Baseline: Goal status: INITIAL  2.  Pt will verbalize awareness of equal WB and more frequent corrections Baseline:  Goal status: INITIAL  3.  Independent in HEP as  it has been established in the short term Baseline:  Goal status: INITIAL   LONG TERM GOALS: Target date: 03/28/22  Pt will meet FOTO goals for hip and shoulder Baseline:  Goal status: INITIAL  2.  6 MWT to distance greater than 50-59 age normative values Baseline:  Goal status: INITIAL  3.  Able to fix hair independently pain <=3/10 Baseline:  Goal status: INITIAL  4.  Able to dress with minimal difficulty for shoulders and hip Baseline:  Goal status: INITIAL  5.  Able to  navigate stairs with reciprocal pattern Baseline:  Goal status: INITIAL  6.  Able to tolerate ADLs without requiring long periods in her bed due to pain Baseline:  Goal status: INITIAL   PLAN: PT FREQUENCY: 1-2x/week  PT DURATION: 12 weeks  PLANNED INTERVENTIONS: Therapeutic exercises, Therapeutic activity, Neuromuscular re-education, Balance training, Gait training, Patient/Family education, Self Care, Joint mobilization, Stair training, Aquatic Therapy, Dry Needling, Electrical stimulation, Spinal mobilization, Cryotherapy, Moist heat, Taping, Manual therapy, and Re-evaluation  PLAN FOR NEXT SESSION: continue GHJ ROM as tolerated, gross lumbopelvic strength   Rudi Heap PT, DPT 01/24/22  9:37 AM

## 2022-01-24 NOTE — Progress Notes (Signed)
Virtual Visit via Telephone Note [facility]  I connected with Katelyn [name]  on 01/24/22 at  2:29 PM by telephone and verified that I am speaking with the correct person using two identifiers.  Location: Patient: Home Provider: [facility]   I discussed the limitations, risks, security and privacy concerns of performing an evaluation and management service by telephone and the availability of in person appointments. I also discussed with the patient that there may be a patient responsible charge related to this service. The patient expressed understanding and agreed to proceed.   INTERVAL HISTORY: [name] follows at our clinic for iron deficiency and B12 deficiency secondary to malabsorption from gastric bypass surgery and chronic PPI use.  She was last evaluated by Tarri Abernethy PA-C via telemedicine visit on 09/17/2021.  At today's visit, she reports feeling fair.  She was hospitalized for meningitis from 09/27/2021 through 10/01/2021.  She was also told that her left kidney is nonfunctioning.  She continues to have chronic fatigue that is worse from hospitalization in June.  She reports headaches and restless legs.  She denies any pica, chest pain, dyspnea on exertion, lightheadedness, or syncope.  She has not had any abnormal bleeding such as hematemesis, hematochezia, melena, or epistaxis.  She reports that her menstrual periods are unchanged - last 2 days of every 28 days, but very heavy during that time.    She takes bariatric vitamin at home daily.  She has 50% energy and 100% appetite.  She is eating well to maintain a stable weight at this time.    OBSERVATIONS/OBJECTIVE: Review of Systems  Constitutional:  Positive for malaise/fatigue. Negative for chills, diaphoresis, fever and weight loss.  Respiratory:  Negative for cough and shortness of breath.   Cardiovascular:  Negative for chest pain and palpitations.   Gastrointestinal:  Positive for constipation and nausea. Negative for abdominal pain, blood in stool, melena and vomiting.  Musculoskeletal:  Positive for back pain, joint pain and myalgias (diffuse chronic pain).  Neurological:  Positive for tingling and headaches. Negative for dizziness.  Psychiatric/Behavioral:  Positive for depression. The patient is nervous/anxious and has insomnia.      PHYSICAL EXAM (per limitations of virtual telephone visit): The patient is alert and oriented x 3, exhibiting adequate mentation, good mood, and ability to speak in full sentences and execute sound judgement.   ASSESSMENT & PLAN: 1.  Iron deficiency state: - Secondary to malabsorption in the setting of prior gastric bypass surgery and chronic PPI use. - Patient denies any signs or symptoms of abnormal blood loss; no bright red blood per rectum or melena.  Menses are regular, last 3 days, but with heavy blood loss and clots.     - She reports that her surgery in August 2022 (removal of breast implants) resulted in large hematoma requiring surgical drainage - Injectafer on 01/28/2019 and 05/04/2020 - patient had a reaction after her Injectafer treatment (fever, headaches, general malaise), despite being premedicated with Benadryl, Tylenol, and Solu-Medrol.  She had previous reaction to [facility] and has multiple medication allergies. - Most recent IV iron (Venofer 200 mg x 5) from 03/15/2021 through 04/19/2021 - Most recent labs (01/17/2022): Hgb 14.2/MCV 97.8, ferritin 62, iron saturation 11% - Symptomatic with chronic fatigue, slightly worse than baseline. - PLAN: Commend IV Venofer 300 mg x 3 due to symptomatic iron deficiency (ferritin <100, iron saturation <20%) with inability to absorb oral iron supplementation - Due to history of  side effects and mild reaction to Injectafer and Feraheme, we will premedicate with steroids for any future IV iron infusions.  - Discussed with patient that flulike syndrome and  malaise is a normal side effect in some patients, and does not constitute a medical allergy.  - We will recheck labs and RTC in 6 months with phone visit   2.  Macrocytosis, mild: - She has intermittent mild macrocytosis - She had history of fatty infiltration of liver prior to her gastric bypass surgery (per abdominal US on 07/13/2009).  No subsequent imaging after surgery in 2015. - Additional work-up including SPEP and TSH was unremarkable.  LFTs normal.  Normal LDH, ESR, CRP, copper. - Most recent MCV normal at 97.8 (01/17/2022) - No vitamin K24 or folic acid deficiency per labs from 09/10/2021 - Most recent abdominal imaging with CT abdomen/renal stone study (10/14/2020): Unremarkable unenhanced appearance of liver, no focal lesion identified; spleen normal in size without focal abnormality - PLAN: Suspect macrocytosis possibly due to fatty liver disease.  In the absence of other hematologic abnormalities, we will continue to watch and wait at this time.   3.  Elevated B12 levels: - She had a persistently elevated B12 level since at least June of 2021 - She takes bariatric vitamin at home daily. - Unclear etiology, I have explained to her that this is most likely an acute phase reactant.  - PLAN: We will continue to watch and wait.  No immediate cause for concern from a hematology/oncology standpoint, patient may benefit from additional work-up by other specialists in this regard. - We will recheck B12, folate, methylmalonic acid, homocystine every 6-12 months.   4.  Erythrocytosis & thrombocytosis, RESOLVED - CBC (06/10/2021) showed elevated hemoglobin 15.6 and elevated platelets 572 - She has had occasional intermittent thrombocytosis, likely reactive - She has had occasional intermittent erythrocytosis - Suspect the most recent elevations are reactive following her recent surgery in February 2023 for left gluteus medius tear repair. - PLAN:  No suspicion for MPN at this time.   FOLLOW  UP INSTRUCTIONS: Venofer 300 mg x 3 Labs in 6 months (CBC/D+ ferritin + iron/TIBC + B12 + folate + MMA + homocystine + CMP)   I discussed the assessment and treatment plan with the patient. The patient was provided an opportunity to ask questions and all were answered. The patient agreed with the plan and demonstrated an understanding of the instructions.   The patient was advised to call back or seek an in-person evaluation if the symptoms worsen or if the condition fails to improve as anticipated.  I provided 23 minutes of non-face-to-face time during this encounter.   Harriett Rush, PA-C 01/24/22 9:25 PM

## 2022-01-27 DIAGNOSIS — F411 Generalized anxiety disorder: Secondary | ICD-10-CM | POA: Diagnosis not present

## 2022-01-27 DIAGNOSIS — F5001 Anorexia nervosa, restricting type: Secondary | ICD-10-CM | POA: Diagnosis not present

## 2022-01-27 DIAGNOSIS — F331 Major depressive disorder, recurrent, moderate: Secondary | ICD-10-CM | POA: Diagnosis not present

## 2022-01-28 ENCOUNTER — Ambulatory Visit (INDEPENDENT_AMBULATORY_CARE_PROVIDER_SITE_OTHER): Payer: PPO | Admitting: Family Medicine

## 2022-01-28 VITALS — BP 134/83 | HR 97 | Temp 97.5°F | Ht 65.5 in

## 2022-01-28 DIAGNOSIS — Z79899 Other long term (current) drug therapy: Secondary | ICD-10-CM

## 2022-01-28 DIAGNOSIS — Z13220 Encounter for screening for lipoid disorders: Secondary | ICD-10-CM | POA: Diagnosis not present

## 2022-01-28 DIAGNOSIS — Z23 Encounter for immunization: Secondary | ICD-10-CM

## 2022-01-28 DIAGNOSIS — N261 Atrophy of kidney (terminal): Secondary | ICD-10-CM

## 2022-01-28 MED ORDER — FLUCONAZOLE 150 MG PO TABS
ORAL_TABLET | ORAL | 4 refills | Status: DC
Start: 2022-01-28 — End: 2022-02-25

## 2022-01-28 NOTE — Progress Notes (Signed)
   Subjective:    Patient ID: Katelyn [name], female    DOB: [birth year], 46 y.o.   MRN: [id number]  HPI Discuss blood sugars and blood pressures Patient at times has middle-of-the-road blood pressure sometimes a little bit low but nothing dangerous.  Her sugars on the other hand though have had some low sugars she has difficult time with eating that she has seen a nutritionist for to help her with her underlying medical issues and we did discuss various ways to try to have small meals frequently to try to level her glucose readings as best as possible   Yeast infection - itchiness    Review of Systems     Objective:   Physical Exam Lungs clear heart regular BP good       Assessment & Plan:  Relative hypoglycemia-small meals frequently to avoid low sugars patient to keep ongoing details of this and let us know if any side issues are going on  1. Left renal atrophy We will check metabolic 7 level bumping up and losartan to 25 mg every single day she will do the blood work approximately 2 weeks after doing this - Lipid panel - Basic metabolic panel  2. Screening, lipid Screening - Lipid panel  3. High risk medication use Check metabolic 7 - Lipid panel - Basic metabolic panel  4. Immunization due Flu shot today - Flu Vaccine QUAD 76moIM (Fluarix, Fluzone & Alfiuria Quad PF)

## 2022-01-30 ENCOUNTER — Ambulatory Visit (HOSPITAL_BASED_OUTPATIENT_CLINIC_OR_DEPARTMENT_OTHER): Payer: PPO | Admitting: Physical Therapy

## 2022-01-30 DIAGNOSIS — M461 Sacroiliitis, not elsewhere classified: Secondary | ICD-10-CM | POA: Diagnosis not present

## 2022-01-30 DIAGNOSIS — M533 Sacrococcygeal disorders, not elsewhere classified: Secondary | ICD-10-CM | POA: Diagnosis not present

## 2022-01-30 DIAGNOSIS — M7918 Myalgia, other site: Secondary | ICD-10-CM | POA: Diagnosis not present

## 2022-01-30 DIAGNOSIS — Z79891 Long term (current) use of opiate analgesic: Secondary | ICD-10-CM | POA: Diagnosis not present

## 2022-01-30 DIAGNOSIS — M5136 Other intervertebral disc degeneration, lumbar region: Secondary | ICD-10-CM | POA: Diagnosis not present

## 2022-01-30 DIAGNOSIS — G894 Chronic pain syndrome: Secondary | ICD-10-CM | POA: Diagnosis not present

## 2022-01-30 DIAGNOSIS — G8929 Other chronic pain: Secondary | ICD-10-CM | POA: Diagnosis not present

## 2022-02-06 DIAGNOSIS — F411 Generalized anxiety disorder: Secondary | ICD-10-CM | POA: Diagnosis not present

## 2022-02-06 DIAGNOSIS — F5001 Anorexia nervosa, restricting type: Secondary | ICD-10-CM | POA: Diagnosis not present

## 2022-02-06 DIAGNOSIS — F331 Major depressive disorder, recurrent, moderate: Secondary | ICD-10-CM | POA: Diagnosis not present

## 2022-02-07 ENCOUNTER — Ambulatory Visit (HOSPITAL_BASED_OUTPATIENT_CLINIC_OR_DEPARTMENT_OTHER): Payer: PPO | Attending: Orthopaedic Surgery | Admitting: Physical Therapy

## 2022-02-07 ENCOUNTER — Encounter (HOSPITAL_BASED_OUTPATIENT_CLINIC_OR_DEPARTMENT_OTHER): Payer: Self-pay | Admitting: Physical Therapy

## 2022-02-07 DIAGNOSIS — M25511 Pain in right shoulder: Secondary | ICD-10-CM | POA: Diagnosis not present

## 2022-02-07 DIAGNOSIS — M25652 Stiffness of left hip, not elsewhere classified: Secondary | ICD-10-CM | POA: Diagnosis not present

## 2022-02-07 DIAGNOSIS — G8929 Other chronic pain: Secondary | ICD-10-CM | POA: Insufficient documentation

## 2022-02-07 DIAGNOSIS — M25512 Pain in left shoulder: Secondary | ICD-10-CM | POA: Diagnosis not present

## 2022-02-07 DIAGNOSIS — R262 Difficulty in walking, not elsewhere classified: Secondary | ICD-10-CM | POA: Insufficient documentation

## 2022-02-07 DIAGNOSIS — M6281 Muscle weakness (generalized): Secondary | ICD-10-CM | POA: Insufficient documentation

## 2022-02-07 DIAGNOSIS — M25552 Pain in left hip: Secondary | ICD-10-CM | POA: Diagnosis not present

## 2022-02-07 NOTE — Therapy (Signed)
OUTPATIENT PHYSICAL THERAPY LOWER EXTREMITY TREATMENT   Patient Name: Katelyn [name] MRN: [id number] DOB:03-25-1976, 46 y.o., female Today's Date: 02/07/2022   PT End of Session - 02/07/22 0933     Visit Number 2    Number of Visits 15    Date for PT Re-Evaluation 04/26/22    Authorization Type HTA    PT Start Time 0931    PT Stop Time 1005    PT Time Calculation (min) 34 min    Activity Tolerance Patient tolerated treatment well;Patient limited by pain    Behavior During Therapy [facility] for tasks assessed/performed              Past Medical History:  Diagnosis Date   Anemia    Anorexia    Anxiety    Asthma    controlled with meds   Benign juvenile melanoma    Chronic headaches    Colon polyps    found on colonoscopy 25/08/3974   Complication of anesthesia    itching after epidural for c section   Constipation    Depression    Depression    several suicide attempts, hospitaluzed in 2012 for this; hx pf ECT treatments ; pt sees Dr. Adele Schilder pyschiatrist  and doing well on medication   Dysrhythmia    hx of PVC's (pt hasn't seen cardiologist since 2021- no follow up needed)   GERD (gastroesophageal reflux disease)    Headache    Heart murmur    as a child - no one mentions hearing murmur anymore, sometimes it is heard. Echo has been performed 02/03/18   Heartburn    no meds   History of pneumonia    x 3  years ago - no recent problems   Hx of blood clots    Left renal atrophy 12/19/2021   Neuromuscular disorder (HCC)    small fiber neuropathy   Obesity    Pneumonia    Polycystic ovary    takes metformin to treat   Renal atrophy, left 2023   Skin lesion    Excisional biopsy of moles - none cancerous   Sleep apnea    yrs ago - diagnosed mild sleep apnea - did not have to use cpap    Past Surgical History:  Procedure Laterality Date   BREAST CAPSULECTOMY Bilateral 12/13/2020   1 week later pt had hematoma surgery on left breast   BREAST ENHANCEMENT  SURGERY Bilateral 02/11/2017   BREATH TEK H PYLORI N/A 07/22/2013   Procedure: BREATH TEK H PYLORI;  Surgeon: Edward Jolly, MD;  Location: [facility];  Service: General;  Laterality: N/A;   CESAREAN SECTION  2004, 2007   x 2   COLONOSCOPY  2013   CYSTOSCOPY WITH URETHRAL DILATATION  age 65    GASTRIC ROUX-EN-Y N/A 10/31/2013   Procedure: LAPAROSCOPIC ROUX-EN-Y GASTRIC BYPASS WITH UPPER ENDOSCOPY;  Surgeon: Edward Jolly, MD;  Location: [facility];  Service: General;  Laterality: N/A;   GLUTEUS MINIMUS REPAIR Left 05/28/2021   Procedure: Left [place];  Surgeon: Vanetta Mulders, MD;  Location: [place];  Service: Orthopedics;  Laterality: Left;   ingrown toenail Right    Ingrown nail on great right toe   kidney stent  08/28/2014   mole excision     "benign juvenile melanoma" removed from left leg - inner thigh   POLYPECTOMY     2013   SKIN LESION EXCISION     back   TONSILLECTOMY  age 25  TUBAL LIGATION  2018   Patient Active Problem List   Diagnosis Date Noted   Left renal atrophy 12/19/2021   Stage 2 chronic kidney disease 11/20/2021   Meningitis 09/27/2021   Epigastric pain 09/27/2021   GERD (gastroesophageal reflux disease) 09/27/2021   Rash and nonspecific skin eruption 09/27/2021   Hypocalcemia 09/27/2021   Chronic pain syndrome 09/27/2021   Sepsis ([place])    Fever 09/25/2021   Tear of left gluteus medius tendon    Asthma 07/18/2020   Vitamin B 12 deficiency 10/16/2019   Elevated LFTs 10/16/2019   Asthma, exogenous, unspecified asthma severity, uncomplicated 65/46/5035   B12 deficiency anemia 06/21/2018   Venous insufficiency 04/23/2018   Onychomycosis of toenail 04/17/2018   Chronic myofascial pain 03/18/2018   Small fiber neuropathy 03/18/2018   Idiopathic small fiber sensory neuropathy 02/23/2018   Varicose veins of both lower extremities 01/13/2018   Cervical spondylosis without myelopathy 08/28/2017   Migraine with aura and without status  migrainosus, not intractable 07/22/2017   Lumbar degenerative disc disease 07/20/2017   Spondylosis of lumbar spine 07/20/2017   Coccydynia 03/12/2017   Bariatric surgery status 05/02/2016   Major depressive disorder, recurrent episode, moderate ([place]) 02/18/2016   Tachy-brady syndrome ([place]) 12/19/2015   Iron deficiency anemia 06/10/2015   Sleep disorder 06/01/2013   Generalized anxiety disorder 05/30/2013   Chronic headaches 07/01/2012   Headache 07/01/2012   Depression, major, recurrent, severe with psychosis ([place]) 06/17/2012   Severe recurrent major depressive disorder with psychotic symptoms ([place]) 06/17/2012   Family history of colonic polyps 03/03/2012   Eating disorder 12/18/2011   Nausea & vomiting 01/22/2010   Polycystic ovarian syndrome 09/01/2008   Intermittent left-sided chest pain 08/02/2008    REFERRING PROVIDER: Vanetta Mulders, MD  REFERRING DIAG:  M25.511,G89.29,M25.512 (ICD-10-CM) - Chronic pain of both shoulders  S76.012A (ICD-10-CM) - Tear of left gluteus medius tendon, initial encounter  B/L shoulder program ROM and strenghening  L hip strengthening program  THERAPY DIAG:  Pain in left hip  Chronic left shoulder pain  Acute pain of right shoulder  Rationale for Evaluation and Treatment Rehabilitation  ONSET DATE: most recently June 2023  SUBJECTIVE:   SUBJECTIVE STATEMENT: Shoulders are bad. Cannot sleep. Lay prone to sleep- turn head and contralateral anterior shoulder hurts. Arms go numb. Having to lay on my side which makes by buttock pain increase. Left arm feels really weak.   PERTINENT HISTORY: Sensory neuropathy, chronic pain, MDD, DDD  PAIN:  Are you having pain? Yes: NPRS scale: 7-shoulder, 3-hip/10 Pain location: bil shoulder, Lt hip Pain description: see subj Aggravating factors: see subj Relieving factors: see subj  PRECAUTIONS: Other: sensory neropathy Does not tolerate seated or supine positioning well.   WEIGHT BEARING  RESTRICTIONS No  FALLS:  Has patient fallen in last 6 months? No  LIVING ENVIRONMENT: Lives with: lives with their family  OCCUPATION: not working  PLOF: Independent with basic ADLs and Independent with community mobility without device  PATIENT GOALS decr pain, incr ability and stamina, out of the bed more, up steps, dressing    OBJECTIVE:   DIAGNOSTIC FINDINGS: bil shoulder xrays unremarkable on 12/06/21  PATIENT SURVEYS:  FOTO Hip 40, Shoulder 45  COGNITION:  Overall cognitive status: Within functional limits for tasks assessed     SENSATION: Lt UE neuropathy-painful to touch, light sensation in Rt UE LLE neuropathy, both feet have a little bit  POSTURE: weight off of LLE  PALPATION: TTP at Lt hip surgical site  LOWER EXTREMITY  ROM:  Active ROM- in standing Right eval Left eval  Hip flexion 78 74  Hip extension 14 20  Hip abduction 20 18   (Blank rows = not tested)  LOWER EXTREMITY MMT:  MMT Right eval Left eval  Hip flexion    Hip extension    Hip abduction    Hip adduction    Hip internal rotation    Hip external rotation    Knee flexion    Knee extension    Ankle dorsiflexion    Ankle plantarflexion    Ankle inversion    Ankle eversion     (Blank rows = not tested)   FUNCTIONAL TESTS:  6MWT 1033 ft    RPE: 8/10; pain up to 8/10 on both sides (Lt more than Rt)  GAIT: Distance walked: Rt trendelenburg- pt verbalizes sensation of instability Assistive device utilized: none at eval  AROM Right EVAL  Left EVAL  Shoulder flexion 110 112  Shoulder extension 26 22  Shoulder abduction 108 100  Shoulder adduction    Shoulder internal rotation Below Sacrum mid buttock Just past neutral not behind body  Shoulder external rotation t2 c5  (Blank rows = not tested)  UEROM: Unable to hook bra behind back Difficulty shaving under arms, brushing hair, crossing arms hurts, unable to reach over either shoulder. *pt verbalized numbness in feet  with end range overpressure in standing shoulder flexion   TODAY'S TREATMENT: Treatment                            02/07/22:  MANUAL: STM periscap, upper traps; passive scap mobilization; thoracic & rib mobs Prone scap retraction Standing W with breathing for rib cage expansion Standing pulleys into flexion Mini wall squat- UE horiz abd supported by pulleys Plank/pull off for hip hinge at silver bar   Treatment                            01/23/22: Scap retraction Pulleys Table L stretch for shoulder flexion IR stretch with strap Glut set Standing hip abd Heel raises    PATIENT EDUCATION:  Education details: Anatomy of condition, POC, HEP, exercise form/rationale Person educated: Patient Education method: Explanation, Demonstration, Tactile cues, Verbal cues, and Handouts Education comprehension: verbalized understanding, returned demonstration, verbal cues required, tactile cues required, and needs further education   HOME EXERCISE PROGRAM: [id number]  ASSESSMENT:  CLINICAL IMPRESSION: Good tolerance to prone position with faceplate putting her in slight cervical flexion- less numbness in UEs. Will consider a prone face place for sleeping. Minimal endurance for exercise and legs felt exhausted- does not take seated rest breaks due to buttock pain. Overall shoulder motion is going slightly above shoulder height actively but pt reports increased pain in doing so.    OBJECTIVE IMPAIRMENTS decreased activity tolerance, difficulty walking, decreased ROM, decreased strength, increased muscle spasms, impaired flexibility, impaired sensation, improper body mechanics, postural dysfunction, and pain.   ACTIVITY LIMITATIONS carrying, lifting, bending, sitting, standing, squatting, sleeping, stairs, transfers, bed mobility, bathing, toileting, dressing, reach over head, hygiene/grooming, locomotion level, and caring for others  PARTICIPATION LIMITATIONS: meal prep, cleaning, laundry,  driving, shopping, and community activity  PERSONAL FACTORS 3+ comorbidities: see PMH  are also affecting patient's functional outcome.     GOALS: Goals reviewed with patient? Yes  SHORT TERM GOALS: Target date: 02/21/2022  GHJ IR to midline bilaterally Baseline: Goal status: INITIAL  2.  Pt will verbalize awareness of equal WB and more frequent corrections Baseline:  Goal status: INITIAL  3.  Independent in HEP as it has been established in the short term Baseline:  Goal status: INITIAL   LONG TERM GOALS: Target date: 03/28/22  Pt will meet FOTO goals for hip and shoulder Baseline:  Goal status: INITIAL  2.  6 MWT to distance greater than 50-59 age normative values Baseline:  Goal status: INITIAL  3.  Able to fix hair independently pain <=3/10 Baseline:  Goal status: INITIAL  4.  Able to dress with minimal difficulty for shoulders and hip Baseline:  Goal status: INITIAL  5.  Able to navigate stairs with reciprocal pattern Baseline:  Goal status: INITIAL  6.  Able to tolerate ADLs without requiring long periods in her bed due to pain Baseline:  Goal status: INITIAL   PLAN: PT FREQUENCY: 1-2x/week  PT DURATION: 12 weeks  PLANNED INTERVENTIONS: Therapeutic exercises, Therapeutic activity, Neuromuscular re-education, Balance training, Gait training, Patient/Family education, Self Care, Joint mobilization, Stair training, Aquatic Therapy, Dry Needling, Electrical stimulation, Spinal mobilization, Cryotherapy, Moist heat, Taping, Manual therapy, and Re-evaluation  PLAN FOR NEXT SESSION: continue prone manual work   [employer] C. Marielena Harvell PT, DPT 02/07/22 10:08 AM

## 2022-02-10 DIAGNOSIS — Z13220 Encounter for screening for lipoid disorders: Secondary | ICD-10-CM | POA: Diagnosis not present

## 2022-02-10 DIAGNOSIS — Z79899 Other long term (current) drug therapy: Secondary | ICD-10-CM | POA: Diagnosis not present

## 2022-02-10 DIAGNOSIS — N261 Atrophy of kidney (terminal): Secondary | ICD-10-CM | POA: Diagnosis not present

## 2022-02-11 LAB — LIPID PANEL
Chol/HDL Ratio: 3.1 ratio (ref 0.0–4.4)
Cholesterol, Total: 178 mg/dL (ref 100–199)
HDL: 58 mg/dL (ref >39)
LDL Chol Calc (NIH): 99 mg/dL (ref 0–99)
Triglycerides: 117 mg/dL (ref 0–149)
VLDL Cholesterol Cal: 21 mg/dL (ref 5–40)

## 2022-02-11 LAB — BASIC METABOLIC PANEL
BUN/Creatinine Ratio: 15 (ref 9–23)
BUN: 13 mg/dL (ref 6–24)
CO2: 25 mmol/L (ref 20–29)
Calcium: 9.7 mg/dL (ref 8.7–10.2)
Chloride: 100 mmol/L (ref 96–106)
Creatinine, Ser: 0.85 mg/dL (ref 0.57–1.00)
Glucose: 96 mg/dL (ref 70–99)
Potassium: 4.8 mmol/L (ref 3.5–5.2)
Sodium: 143 mmol/L (ref 134–144)
eGFR: 86 mL/min/{1.73_m2} (ref >59)

## 2022-02-11 NOTE — Therapy (Unsigned)
OUTPATIENT PHYSICAL THERAPY LOWER EXTREMITY TREATMENT   Patient Name: Katelyn [name] MRN: [id number] DOB:Apr 04, 1976, 46 y.o., female Today's Date: 02/13/2022   PT End of Session - 02/13/22 1210     Visit Number 3    Number of Visits 15    Date for PT Re-Evaluation 04/26/22    Authorization Type HTA    PT Start Time 1015    PT Stop Time 1055    PT Time Calculation (min) 40 min    Activity Tolerance Patient tolerated treatment well;Patient limited by pain    Behavior During Therapy [facility] for tasks assessed/performed               Past Medical History:  Diagnosis Date   Anemia    Anorexia    Anxiety    Asthma    controlled with meds   Benign juvenile melanoma    Chronic headaches    Colon polyps    found on colonoscopy 29/79/8921   Complication of anesthesia    itching after epidural for c section   Constipation    Depression    Depression    several suicide attempts, hospitaluzed in 2012 for this; hx pf ECT treatments ; pt sees Dr. Adele Schilder pyschiatrist  and doing well on medication   Dysrhythmia    hx of PVC's (pt hasn't seen cardiologist since 2021- no follow up needed)   GERD (gastroesophageal reflux disease)    Headache    Heart murmur    as a child - no one mentions hearing murmur anymore, sometimes it is heard. Echo has been performed 02/03/18   Heartburn    no meds   History of pneumonia    x 3  years ago - no recent problems   Hx of blood clots    Left renal atrophy 12/19/2021   Neuromuscular disorder (HCC)    small fiber neuropathy   Obesity    Pneumonia    Polycystic ovary    takes metformin to treat   Renal atrophy, left 2023   Skin lesion    Excisional biopsy of moles - none cancerous   Sleep apnea    yrs ago - diagnosed mild sleep apnea - did not have to use cpap    Past Surgical History:  Procedure Laterality Date   BREAST CAPSULECTOMY Bilateral 12/13/2020   1 week later pt had hematoma surgery on left breast   BREAST ENHANCEMENT  SURGERY Bilateral 02/11/2017   BREATH TEK H PYLORI N/A 07/22/2013   Procedure: BREATH TEK H PYLORI;  Surgeon: Edward Jolly, MD;  Location: [facility];  Service: General;  Laterality: N/A;   CESAREAN SECTION  2004, 2007   x 2   COLONOSCOPY  2013   CYSTOSCOPY WITH URETHRAL DILATATION  age 69    GASTRIC ROUX-EN-Y N/A 10/31/2013   Procedure: LAPAROSCOPIC ROUX-EN-Y GASTRIC BYPASS WITH UPPER ENDOSCOPY;  Surgeon: Edward Jolly, MD;  Location: [facility];  Service: General;  Laterality: N/A;   GLUTEUS MINIMUS REPAIR Left 05/28/2021   Procedure: Left [place];  Surgeon: Vanetta Mulders, MD;  Location: [facility];  Service: Orthopedics;  Laterality: Left;   ingrown toenail Right    Ingrown nail on great right toe   kidney stent  08/28/2014   mole excision     "benign juvenile melanoma" removed from left leg - inner thigh   POLYPECTOMY     2013   SKIN LESION EXCISION     back   TONSILLECTOMY  age 21  TUBAL LIGATION  2018   Patient Active Problem List   Diagnosis Date Noted   Left renal atrophy 12/19/2021   Stage 2 chronic kidney disease 11/20/2021   Meningitis 09/27/2021   Epigastric pain 09/27/2021   GERD (gastroesophageal reflux disease) 09/27/2021   Rash and nonspecific skin eruption 09/27/2021   Hypocalcemia 09/27/2021   Chronic pain syndrome 09/27/2021   Sepsis ([place])    Fever 09/25/2021   Tear of left gluteus medius tendon    Asthma 07/18/2020   Vitamin B 12 deficiency 10/16/2019   Elevated LFTs 10/16/2019   Asthma, exogenous, unspecified asthma severity, uncomplicated 44/31/5400   B12 deficiency anemia 06/21/2018   Venous insufficiency 04/23/2018   Onychomycosis of toenail 04/17/2018   Chronic myofascial pain 03/18/2018   Small fiber neuropathy 03/18/2018   Idiopathic small fiber sensory neuropathy 02/23/2018   Varicose veins of both lower extremities 01/13/2018   Cervical spondylosis without myelopathy 08/28/2017   Migraine with aura and without status  migrainosus, not intractable 07/22/2017   Lumbar degenerative disc disease 07/20/2017   Spondylosis of lumbar spine 07/20/2017   Coccydynia 03/12/2017   Bariatric surgery status 05/02/2016   Major depressive disorder, recurrent episode, moderate ([place]) 02/18/2016   Tachy-brady syndrome ([place]) 12/19/2015   Iron deficiency anemia 06/10/2015   Sleep disorder 06/01/2013   Generalized anxiety disorder 05/30/2013   Chronic headaches 07/01/2012   Headache 07/01/2012   Depression, major, recurrent, severe with psychosis ([place]) 06/17/2012   Severe recurrent major depressive disorder with psychotic symptoms ([place]) 06/17/2012   Family history of colonic polyps 03/03/2012   Eating disorder 12/18/2011   Nausea & vomiting 01/22/2010   Polycystic ovarian syndrome 09/01/2008   Intermittent left-sided chest pain 08/02/2008    REFERRING PROVIDER: Vanetta Mulders, MD  REFERRING DIAG:  M25.511,G89.29,M25.512 (ICD-10-CM) - Chronic pain of both shoulders  S76.012A (ICD-10-CM) - Tear of left gluteus medius tendon, initial encounter  B/L shoulder program ROM and strenghening  L hip strengthening program  THERAPY DIAG:  Pain in left hip  Chronic left shoulder pain  Acute pain of right shoulder  Muscle weakness (generalized)  Stiffness of left hip, not elsewhere classified  Difficulty in walking, not elsewhere classified  Rationale for Evaluation and Treatment Rehabilitation  ONSET DATE: most recently June 2023  SUBJECTIVE:   SUBJECTIVE STATEMENT: Pt states that she continues to have a lot of pain in her entire L side.   Shoulders are bad. Cannot sleep. Lay prone to sleep- turn head and contralateral anterior shoulder hurts. Arms go numb. Having to lay on my side which makes by buttock pain increase. Left arm feels really weak.   PERTINENT HISTORY: Sensory neuropathy, chronic pain, MDD, DDD  PAIN:  Are you having pain? Yes: NPRS scale: 7-shoulder, 3-hip/10 Pain location: bil shoulder, Lt  hip Pain description: see subj Aggravating factors: see subj Relieving factors: see subj  PRECAUTIONS: Other: sensory neropathy Does not tolerate seated or supine positioning well.   WEIGHT BEARING RESTRICTIONS No  FALLS:  Has patient fallen in last 6 months? No  LIVING ENVIRONMENT: Lives with: lives with their family  OCCUPATION: not working  PLOF: Independent with basic ADLs and Independent with community mobility without device  PATIENT GOALS decr pain, incr ability and stamina, out of the bed more, up steps, dressing    OBJECTIVE:   DIAGNOSTIC FINDINGS: bil shoulder xrays unremarkable on 12/06/21  PATIENT SURVEYS:  FOTO Hip 40, Shoulder 45  COGNITION:  Overall cognitive status: Within functional limits for tasks assessed  SENSATION: Lt UE neuropathy-painful to touch, light sensation in Rt UE LLE neuropathy, both feet have a little bit  POSTURE: weight off of LLE  PALPATION: TTP at Lt hip surgical site  LOWER EXTREMITY ROM:  Active ROM- in standing Right eval Left eval  Hip flexion 78 74  Hip extension 14 20  Hip abduction 20 18   (Blank rows = not tested)  LOWER EXTREMITY MMT:  MMT Right eval Left eval  Hip flexion    Hip extension    Hip abduction    Hip adduction    Hip internal rotation    Hip external rotation    Knee flexion    Knee extension    Ankle dorsiflexion    Ankle plantarflexion    Ankle inversion    Ankle eversion     (Blank rows = not tested)   FUNCTIONAL TESTS:  6MWT 1033 ft    RPE: 8/10; pain up to 8/10 on both sides (Lt more than Rt)  GAIT: Distance walked: Rt trendelenburg- pt verbalizes sensation of instability Assistive device utilized: none at eval  AROM Right EVAL  Left EVAL  Shoulder flexion 110 112  Shoulder extension 26 22  Shoulder abduction 108 100  Shoulder adduction    Shoulder internal rotation Below Sacrum mid buttock Just past neutral not behind body  Shoulder external rotation t2 c5   (Blank rows = not tested)  UEROM: Unable to hook bra behind back Difficulty shaving under arms, brushing hair, crossing arms hurts, unable to reach over either shoulder. *pt verbalized numbness in feet with end range overpressure in standing shoulder flexion   TODAY'S TREATMENT: Treatment                            02/07/22:  MANUAL: STM periscap, upper traps; passive scap mobilization; thoracic & rib mobs Prone scap retraction Standing W with breathing for rib cage expansion Standing pulleys into flexion Mini wall squat Plank/pull off for hip hinge at silver bar   Treatment                            01/23/22: Scap retraction Pulleys Table L stretch for shoulder flexion IR stretch with strap Glut set Standing hip abd Heel raises    PATIENT EDUCATION:  Education details: Anatomy of condition, POC, HEP, exercise form/rationale Person educated: Patient Education method: Explanation, Demonstration, Tactile cues, Verbal cues, and Handouts Education comprehension: verbalized understanding, returned demonstration, verbal cues required, tactile cues required, and needs further education   HOME EXERCISE PROGRAM: [id number]  ASSESSMENT:  CLINICAL IMPRESSION: Pt tolerated session well today. She had notable tension in her bilat UT's today that reduced with STM. Minimal endurance for exercises today due to reported pain in shoulders and L hip. Continued with similar exercise's this session. Discussed possibility of trying elliptical next session. Attempted to walk, but pt instantly denied due to parking on the opposite side of the parking lot.   OBJECTIVE IMPAIRMENTS decreased activity tolerance, difficulty walking, decreased ROM, decreased strength, increased muscle spasms, impaired flexibility, impaired sensation, improper body mechanics, postural dysfunction, and pain.   ACTIVITY LIMITATIONS carrying, lifting, bending, sitting, standing, squatting, sleeping, stairs, transfers,  bed mobility, bathing, toileting, dressing, reach over head, hygiene/grooming, locomotion level, and caring for others  PARTICIPATION LIMITATIONS: meal prep, cleaning, laundry, driving, shopping, and community activity  PERSONAL FACTORS 3+ comorbidities: see PMH  are also affecting patient's functional  outcome.     GOALS: Goals reviewed with patient? Yes  SHORT TERM GOALS: Target date: 02/21/2022  GH[facility] IR to midline bilaterally Baseline: Goal status: INITIAL  2.  Pt will verbalize awareness of equal WB and more frequent corrections Baseline:  Goal status: INITIAL  3.  Independent in HEP as it has been established in the short term Baseline:  Goal status: INITIAL   LONG TERM GOALS: Target date: 03/28/22  Pt will meet FOTO goals for hip and shoulder Baseline:  Goal status: INITIAL  2.  6 MWT to distance greater than 50-59 age normative values Baseline:  Goal status: INITIAL  3.  Able to fix hair independently pain <=3/10 Baseline:  Goal status: INITIAL  4.  Able to dress with minimal difficulty for shoulders and hip Baseline:  Goal status: INITIAL  5.  Able to navigate stairs with reciprocal pattern Baseline:  Goal status: INITIAL  6.  Able to tolerate ADLs without requiring long periods in her bed due to pain Baseline:  Goal status: INITIAL   PLAN: PT FREQUENCY: 1-2x/week  PT DURATION: 12 weeks  PLANNED INTERVENTIONS: Therapeutic exercises, Therapeutic activity, Neuromuscular re-education, Balance training, Gait training, Patient/Family education, Self Care, Joint mobilization, Stair training, Aquatic Therapy, Dry Needling, Electrical stimulation, Spinal mobilization, Cryotherapy, Moist heat, Taping, Manual therapy, and Re-evaluation  PLAN FOR NEXT SESSION: continue prone manual work   Rudi Heap PT, DPT 02/13/22  12:16 PM

## 2022-02-13 ENCOUNTER — Ambulatory Visit (HOSPITAL_BASED_OUTPATIENT_CLINIC_OR_DEPARTMENT_OTHER): Payer: PPO | Admitting: Physical Therapy

## 2022-02-13 DIAGNOSIS — M25511 Pain in right shoulder: Secondary | ICD-10-CM

## 2022-02-13 DIAGNOSIS — R262 Difficulty in walking, not elsewhere classified: Secondary | ICD-10-CM

## 2022-02-13 DIAGNOSIS — G8929 Other chronic pain: Secondary | ICD-10-CM

## 2022-02-13 DIAGNOSIS — M25552 Pain in left hip: Secondary | ICD-10-CM | POA: Diagnosis not present

## 2022-02-13 DIAGNOSIS — M25652 Stiffness of left hip, not elsewhere classified: Secondary | ICD-10-CM

## 2022-02-13 DIAGNOSIS — M6281 Muscle weakness (generalized): Secondary | ICD-10-CM

## 2022-02-14 ENCOUNTER — Encounter (HOSPITAL_BASED_OUTPATIENT_CLINIC_OR_DEPARTMENT_OTHER): Payer: PPO | Admitting: Physical Therapy

## 2022-02-14 ENCOUNTER — Inpatient Hospital Stay: Payer: PPO | Attending: Physician Assistant

## 2022-02-14 VITALS — BP 105/62 | HR 72 | Temp 98.2°F | Resp 18

## 2022-02-14 DIAGNOSIS — D508 Other iron deficiency anemias: Secondary | ICD-10-CM | POA: Insufficient documentation

## 2022-02-14 DIAGNOSIS — D7589 Other specified diseases of blood and blood-forming organs: Secondary | ICD-10-CM | POA: Insufficient documentation

## 2022-02-14 DIAGNOSIS — R7989 Other specified abnormal findings of blood chemistry: Secondary | ICD-10-CM | POA: Insufficient documentation

## 2022-02-14 MED ORDER — SODIUM CHLORIDE 0.9 % IV SOLN: Freq: Once | INTRAVENOUS | Status: AC

## 2022-02-14 MED ORDER — FAMOTIDINE IN NACL 20-0.9 MG/50ML-% IV SOLN
20.0000 mg | Freq: Once | INTRAVENOUS | Status: AC
  Administered 2022-02-14: 20 mg via INTRAVENOUS
  Filled 2022-02-14: qty 50

## 2022-02-14 MED ORDER — SODIUM CHLORIDE 0.9 % IV SOLN
200.0000 mg | Freq: Once | INTRAVENOUS | Status: AC
  Administered 2022-02-14: 200 mg via INTRAVENOUS
  Filled 2022-02-14: qty 10

## 2022-02-14 NOTE — Progress Notes (Signed)
Patient presents today for Venofer. Patient reports taking tylenol and benadryl at home. Patient refuses to take solu-medrol, pharmacy aware. Patient tolerated iron infusion with no complaints voiced. Peripheral IV site clean and dry with good blood return noted before and after infusion. Patient refusing to wait recommended 30 minute wait time.  Band aid applied. VSS with discharge and left in satisfactory condition with no s/s of distress noted.

## 2022-02-14 NOTE — Patient Instructions (Signed)
[facility]  Discharge Instructions: Thank you for choosing [facility] to provide your oncology and hematology care.  If you have a lab appointment with the [facility], please come in thru the Main Entrance and check in at the main information desk.  Wear comfortable clothing and clothing appropriate for easy access to any Portacath or PICC line.   We strive to give you quality time with your provider. You may need to reschedule your appointment if you arrive late (15 or more minutes).  Arriving late affects you and other patients whose appointments are after yours.  Also, if you miss three or more appointments without notifying the office, you may be dismissed from the clinic at the provider's discretion.      For prescription refill requests, have your pharmacy contact our office and allow 72 hours for refills to be completed.    Today you received the following '200mg'$  Venofer, return as scheduled.  To help prevent nausea and vomiting after your treatment, we encourage you to take your nausea medication as directed.  BELOW ARE SYMPTOMS THAT SHOULD BE REPORTED IMMEDIATELY: *FEVER GREATER THAN 100.4 F (38 C) OR HIGHER *CHILLS OR SWEATING *NAUSEA AND VOMITING THAT IS NOT CONTROLLED WITH YOUR NAUSEA MEDICATION *UNUSUAL SHORTNESS OF BREATH *UNUSUAL BRUISING OR BLEEDING *URINARY PROBLEMS (pain or burning when urinating, or frequent urination) *BOWEL PROBLEMS (unusual diarrhea, constipation, pain near the anus) TENDERNESS IN MOUTH AND THROAT WITH OR WITHOUT PRESENCE OF ULCERS (sore throat, sores in mouth, or a toothache) UNUSUAL RASH, SWELLING OR PAIN  UNUSUAL VAGINAL DISCHARGE OR ITCHING   Items with * indicate a potential emergency and should be followed up as soon as possible or go to the Emergency Department if any problems should occur.  Please show the CHEMOTHERAPY ALERT CARD or IMMUNOTHERAPY ALERT CARD at check-in to the Emergency Department and  triage nurse.  Should you have questions after your visit or need to cancel or reschedule your appointment, please contact [facility] [phone]  and follow the prompts.  Office hours are 8:00 a.m. to 4:30 p.m. Monday - Friday. Please note that voicemails left after 4:00 p.m. may not be returned until the following business day.  We are closed weekends and major holidays. You have access to a nurse at all times for urgent questions. Please call the main number to the clinic [phone] and follow the prompts.  For any non-urgent questions, you may also contact your provider using MyChart. We now offer e-Visits for anyone 17 and older to request care online for non-urgent symptoms. For details visit [email or web address].   Also download the MyChart app! Go to the app store, search "MyChart", open the app, select [facility], and log in with your MyChart username and password.  Masks are optional in the cancer centers. If you would like for your care team to wear a mask while they are taking care of you, please let them know. You may have one support person who is at least 46 years old accompany you for your appointments.

## 2022-02-18 NOTE — Therapy (Unsigned)
OUTPATIENT PHYSICAL THERAPY LOWER EXTREMITY TREATMENT   Patient Name: Katelyn [name] MRN: [id number] DOB:04/17/1976, 46 y.o., female Today's Date: 02/20/2022   PT End of Session - 02/20/22 0925     Visit Number 4    Number of Visits 15    Date for PT Re-Evaluation 04/26/22    Authorization Type HTA    PT Start Time 0925    PT Stop Time 1005    PT Time Calculation (min) 40 min    Activity Tolerance Patient tolerated treatment well    Behavior During Therapy [facility] for tasks assessed/performed                Past Medical History:  Diagnosis Date   Anemia    Anorexia    Anxiety    Asthma    controlled with meds   Benign juvenile melanoma    Chronic headaches    Colon polyps    found on colonoscopy 67/20/9470   Complication of anesthesia    itching after epidural for c section   Constipation    Depression    Depression    several suicide attempts, hospitaluzed in 2012 for this; hx pf ECT treatments ; pt sees Dr. Adele Schilder pyschiatrist  and doing well on medication   Dysrhythmia    hx of PVC's (pt hasn't seen cardiologist since 2021- no follow up needed)   GERD (gastroesophageal reflux disease)    Headache    Heart murmur    as a child - no one mentions hearing murmur anymore, sometimes it is heard. Echo has been performed 02/03/18   Heartburn    no meds   History of pneumonia    x 3  years ago - no recent problems   Hx of blood clots    Left renal atrophy 12/19/2021   Neuromuscular disorder (HCC)    small fiber neuropathy   Obesity    Pneumonia    Polycystic ovary    takes metformin to treat   Renal atrophy, left 2023   Skin lesion    Excisional biopsy of moles - none cancerous   Sleep apnea    yrs ago - diagnosed mild sleep apnea - did not have to use cpap    Past Surgical History:  Procedure Laterality Date   BREAST CAPSULECTOMY Bilateral 12/13/2020   1 week later pt had hematoma surgery on left breast   BREAST ENHANCEMENT SURGERY Bilateral  02/11/2017   BREATH TEK H PYLORI N/A 07/22/2013   Procedure: BREATH TEK H PYLORI;  Surgeon: Edward Jolly, MD;  Location: [facility];  Service: General;  Laterality: N/A;   CESAREAN SECTION  2004, 2007   x 2   COLONOSCOPY  2013   CYSTOSCOPY WITH URETHRAL DILATATION  age 4    GASTRIC ROUX-EN-Y N/A 10/31/2013   Procedure: LAPAROSCOPIC ROUX-EN-Y GASTRIC BYPASS WITH UPPER ENDOSCOPY;  Surgeon: Edward Jolly, MD;  Location: [facility];  Service: General;  Laterality: N/A;   GLUTEUS MINIMUS REPAIR Left 05/28/2021   Procedure: Left [place];  Surgeon: Vanetta Mulders, MD;  Location: [facility];  Service: Orthopedics;  Laterality: Left;   ingrown toenail Right    Ingrown nail on great right toe   kidney stent  08/28/2014   mole excision     "benign juvenile melanoma" removed from left leg - inner thigh   POLYPECTOMY     2013   SKIN LESION EXCISION     back   TONSILLECTOMY  age 66  TUBAL LIGATION  2018   Patient Active Problem List   Diagnosis Date Noted   Left renal atrophy 12/19/2021   Stage 2 chronic kidney disease 11/20/2021   Meningitis 09/27/2021   Epigastric pain 09/27/2021   GERD (gastroesophageal reflux disease) 09/27/2021   Rash and nonspecific skin eruption 09/27/2021   Hypocalcemia 09/27/2021   Chronic pain syndrome 09/27/2021   Sepsis ([place])    Fever 09/25/2021   Tear of left gluteus medius tendon    Asthma 07/18/2020   Vitamin B 12 deficiency 10/16/2019   Elevated LFTs 10/16/2019   Asthma, exogenous, unspecified asthma severity, uncomplicated 19/50/9326   B12 deficiency anemia 06/21/2018   Venous insufficiency 04/23/2018   Onychomycosis of toenail 04/17/2018   Chronic myofascial pain 03/18/2018   Small fiber neuropathy 03/18/2018   Idiopathic small fiber sensory neuropathy 02/23/2018   Varicose veins of both lower extremities 01/13/2018   Cervical spondylosis without myelopathy 08/28/2017   Migraine with aura and without status migrainosus, not  intractable 07/22/2017   Lumbar degenerative disc disease 07/20/2017   Spondylosis of lumbar spine 07/20/2017   Coccydynia 03/12/2017   Bariatric surgery status 05/02/2016   Major depressive disorder, recurrent episode, moderate ([place]) 02/18/2016   Tachy-brady syndrome ([place]) 12/19/2015   Iron deficiency anemia 06/10/2015   Sleep disorder 06/01/2013   Generalized anxiety disorder 05/30/2013   Chronic headaches 07/01/2012   Headache 07/01/2012   Depression, major, recurrent, severe with psychosis ([place]) 06/17/2012   Severe recurrent major depressive disorder with psychotic symptoms ([place]) 06/17/2012   Family history of colonic polyps 03/03/2012   Eating disorder 12/18/2011   Nausea & vomiting 01/22/2010   Polycystic ovarian syndrome 09/01/2008   Intermittent left-sided chest pain 08/02/2008    REFERRING PROVIDER: Vanetta Mulders, MD  REFERRING DIAG:  M25.511,G89.29,M25.512 (ICD-10-CM) - Chronic pain of both shoulders  S76.012A (ICD-10-CM) - Tear of left gluteus medius tendon, initial encounter  B/L shoulder program ROM and strenghening  L hip strengthening program  THERAPY DIAG:  Pain in left hip  Chronic left shoulder pain  Acute pain of right shoulder  Muscle weakness (generalized)  Stiffness of left hip, not elsewhere classified  Difficulty in walking, not elsewhere classified  Rationale for Evaluation and Treatment Rehabilitation  ONSET DATE: most recently June 2023  SUBJECTIVE:   SUBJECTIVE STATEMENT: I have noticed some improvements in my pain. I am no longer only having just bad days. There are now days where the pain is tolerable in my shoulders.   Shoulders are bad. Cannot sleep. Lay prone to sleep- turn head and contralateral anterior shoulder hurts. Arms go numb. Having to lay on my side which makes by buttock pain increase. Left arm feels really weak.   PERTINENT HISTORY: Sensory neuropathy, chronic pain, MDD, DDD  PAIN:  Are you having pain? Yes: NPRS  scale: 7-shoulder, 3-hip/10 Pain location: bil shoulder, Lt hip Pain description: see subj Aggravating factors: see subj Relieving factors: see subj  PRECAUTIONS: Other: sensory neropathy Does not tolerate seated or supine positioning well.   WEIGHT BEARING RESTRICTIONS No  FALLS:  Has patient fallen in last 6 months? No  LIVING ENVIRONMENT: Lives with: lives with their family  OCCUPATION: not working  PLOF: Independent with basic ADLs and Independent with community mobility without device  PATIENT GOALS decr pain, incr ability and stamina, out of the bed more, up steps, dressing    OBJECTIVE:   DIAGNOSTIC FINDINGS: bil shoulder xrays unremarkable on 12/06/21  PATIENT SURVEYS:  FOTO Hip 40, Shoulder 45  COGNITION:  Overall cognitive status: Within functional limits for tasks assessed     SENSATION: Lt UE neuropathy-painful to touch, light sensation in Rt UE LLE neuropathy, both feet have a little bit  POSTURE: weight off of LLE  PALPATION: TTP at Lt hip surgical site  LOWER EXTREMITY ROM:  Active ROM- in standing Right eval Left eval  Hip flexion 78 74  Hip extension 14 20  Hip abduction 20 18   (Blank rows = not tested)  LOWER EXTREMITY MMT:  MMT Right eval Left eval  Hip flexion    Hip extension    Hip abduction    Hip adduction    Hip internal rotation    Hip external rotation    Knee flexion    Knee extension    Ankle dorsiflexion    Ankle plantarflexion    Ankle inversion    Ankle eversion     (Blank rows = not tested)   FUNCTIONAL TESTS:  6MWT 1033 ft    RPE: 8/10; pain up to 8/10 on both sides (Lt more than Rt)  GAIT: Distance walked: Rt trendelenburg- pt verbalizes sensation of instability Assistive device utilized: none at eval  AROM Right EVAL  Left EVAL  Shoulder flexion 110 112  Shoulder extension 26 22  Shoulder abduction 108 100  Shoulder adduction    Shoulder internal rotation Below Sacrum mid buttock Just past  neutral not behind body  Shoulder external rotation t2 c5  (Blank rows = not tested)  UEROM: Unable to hook bra behind back Difficulty shaving under arms, brushing hair, crossing arms hurts, unable to reach over either shoulder. *pt verbalized numbness in feet with end range overpressure in standing shoulder flexion   TODAY'S TREATMENT: Treatment                            02/20/22:  MANUAL: STM periscap, upper traps; passive scap mobilization; thoracic & rib mobs Prone scap retraction Prone glute squeezes Mini wall squats with physioball Open books Standing W with breathing for rib cage expansion with physioball Standing wall slides into scaphion  Plank/pull off for hip hinge at silver bar  Treatment                            02/07/22:  MANUAL: STM periscap, upper traps; passive scap mobilization; thoracic & rib mobs Prone scap retraction Standing W with breathing for rib cage expansion Standing pulleys into flexion Mini wall squat Plank/pull off for hip hinge at silver bar   Treatment                            01/23/22: Scap retraction Pulleys Table L stretch for shoulder flexion IR stretch with strap Glut set Standing hip abd Heel raises    PATIENT EDUCATION:  Education details: Anatomy of condition, POC, HEP, exercise form/rationale Person educated: Patient Education method: Explanation, Demonstration, Tactile cues, Verbal cues, and Handouts Education comprehension: verbalized understanding, returned demonstration, verbal cues required, tactile cues required, and needs further education   HOME EXERCISE PROGRAM: [id number]  ASSESSMENT:  CLINICAL IMPRESSION: Pt tolerated session well today but reports sharp pain isolated to her deltoid insertion on her R side when laying prone. She continues to have tension in her bilat UT's that is reduced with STM. Minimal endurance for exercises today due to reported pain in shoulders. She was willing to actively  walk  today, but reports a sharp pain in her L knee at the patella insertion. Encouraged pt to look into getting a soft tissue massage to help with tissue tension throughout her body. Challenged pt with wall slides today with increased pain reported, but willing to perform 8/10. Standing open books with limited thoracic rotation bilaterally, but reports of improved pain after.Pt will continue to benefit from skilled PT to address continued deficits.    OBJECTIVE IMPAIRMENTS decreased activity tolerance, difficulty walking, decreased ROM, decreased strength, increased muscle spasms, impaired flexibility, impaired sensation, improper body mechanics, postural dysfunction, and pain.   ACTIVITY LIMITATIONS carrying, lifting, bending, sitting, standing, squatting, sleeping, stairs, transfers, bed mobility, bathing, toileting, dressing, reach over head, hygiene/grooming, locomotion level, and caring for others  PARTICIPATION LIMITATIONS: meal prep, cleaning, laundry, driving, shopping, and community activity  PERSONAL FACTORS 3+ comorbidities: see PMH  are also affecting patient's functional outcome.     GOALS: Goals reviewed with patient? Yes  SHORT TERM GOALS: Target date: 02/21/2022  GHJ IR to midline bilaterally Baseline: Goal status: INITIAL  2.  Pt will verbalize awareness of equal WB and more frequent corrections Baseline:  Goal status: INITIAL  3.  Independent in HEP as it has been established in the short term Baseline:  Goal status: INITIAL   LONG TERM GOALS: Target date: 03/28/22  Pt will meet FOTO goals for hip and shoulder Baseline:  Goal status: INITIAL  2.  6 MWT to distance greater than 50-59 age normative values Baseline:  Goal status: INITIAL  3.  Able to fix hair independently pain <=3/10 Baseline:  Goal status: INITIAL  4.  Able to dress with minimal difficulty for shoulders and hip Baseline:  Goal status: INITIAL  5.  Able to navigate stairs with reciprocal  pattern Baseline:  Goal status: INITIAL  6.  Able to tolerate ADLs without requiring long periods in her bed due to pain Baseline:  Goal status: INITIAL   PLAN: PT FREQUENCY: 1-2x/week  PT DURATION: 12 weeks  PLANNED INTERVENTIONS: Therapeutic exercises, Therapeutic activity, Neuromuscular re-education, Balance training, Gait training, Patient/Family education, Self Care, Joint mobilization, Stair training, Aquatic Therapy, Dry Needling, Electrical stimulation, Spinal mobilization, Cryotherapy, Moist heat, Taping, Manual therapy, and Re-evaluation  PLAN FOR NEXT SESSION: continue prone manual work   [employer] PT, DPT 02/20/22  10:09 AM

## 2022-02-20 ENCOUNTER — Ambulatory Visit (HOSPITAL_BASED_OUTPATIENT_CLINIC_OR_DEPARTMENT_OTHER): Payer: PPO | Admitting: Physical Therapy

## 2022-02-20 DIAGNOSIS — M6281 Muscle weakness (generalized): Secondary | ICD-10-CM

## 2022-02-20 DIAGNOSIS — M25552 Pain in left hip: Secondary | ICD-10-CM | POA: Diagnosis not present

## 2022-02-20 DIAGNOSIS — M25652 Stiffness of left hip, not elsewhere classified: Secondary | ICD-10-CM

## 2022-02-20 DIAGNOSIS — M25511 Pain in right shoulder: Secondary | ICD-10-CM

## 2022-02-20 DIAGNOSIS — G8929 Other chronic pain: Secondary | ICD-10-CM

## 2022-02-20 DIAGNOSIS — R262 Difficulty in walking, not elsewhere classified: Secondary | ICD-10-CM

## 2022-02-21 ENCOUNTER — Inpatient Hospital Stay: Payer: PPO

## 2022-02-21 VITALS — BP 110/52 | HR 77 | Temp 98.5°F | Resp 16

## 2022-02-21 DIAGNOSIS — D508 Other iron deficiency anemias: Secondary | ICD-10-CM | POA: Diagnosis not present

## 2022-02-21 MED ORDER — SODIUM CHLORIDE 0.9 % IV SOLN: Freq: Once | INTRAVENOUS | Status: AC

## 2022-02-21 MED ORDER — SODIUM CHLORIDE 0.9 % IV SOLN
200.0000 mg | Freq: Once | INTRAVENOUS | Status: AC
  Administered 2022-02-21: 200 mg via INTRAVENOUS
  Filled 2022-02-21: qty 200

## 2022-02-21 NOTE — Patient Instructions (Signed)
[facility]  Discharge Instructions: Thank you for choosing [facility] to provide your oncology and hematology care.  If you have a lab appointment with the Cancer Center, please come in thru the Main Entrance and check in at the main information desk.  Wear comfortable clothing and clothing appropriate for easy access to any Portacath or PICC line.   We strive to give you quality time with your provider. You may need to reschedule your appointment if you arrive late (15 or more minutes).  Arriving late affects you and other patients whose appointments are after yours.  Also, if you miss three or more appointments without notifying the office, you may be dismissed from the clinic at the provider's discretion.      For prescription refill requests, have your pharmacy contact our office and allow 72 hours for refills to be completed.    Today you received Venofer IV iron infusion.     BELOW ARE SYMPTOMS THAT SHOULD BE REPORTED IMMEDIATELY: *FEVER GREATER THAN 100.4 F (38 C) OR HIGHER *CHILLS OR SWEATING *NAUSEA AND VOMITING THAT IS NOT CONTROLLED WITH YOUR NAUSEA MEDICATION *UNUSUAL SHORTNESS OF BREATH *UNUSUAL BRUISING OR BLEEDING *URINARY PROBLEMS (pain or burning when urinating, or frequent urination) *BOWEL PROBLEMS (unusual diarrhea, constipation, pain near the anus) TENDERNESS IN MOUTH AND THROAT WITH OR WITHOUT PRESENCE OF ULCERS (sore throat, sores in mouth, or a toothache) UNUSUAL RASH, SWELLING OR PAIN  UNUSUAL VAGINAL DISCHARGE OR ITCHING   Items with * indicate a potential emergency and should be followed up as soon as possible or go to the Emergency Department if any problems should occur.  Please show the CHEMOTHERAPY ALERT CARD or IMMUNOTHERAPY ALERT CARD at check-in to the Emergency Department and triage nurse.  Should you have questions after your visit or need to cancel or reschedule your appointment, please contact [facility] [phone]  and follow the prompts.  Office hours are 8:00 a.m. to 4:30 p.m. Monday - Friday. Please note that voicemails left after 4:00 p.m. may not be returned until the following business day.  We are closed weekends and major holidays. You have access to a nurse at all times for urgent questions. Please call the main number to the clinic [phone] and follow the prompts.  For any non-urgent questions, you may also contact your provider using MyChart. We now offer e-Visits for anyone 18 and older to request care online for non-urgent symptoms. For details visit [email or web address].   Also download the MyChart app! Go to the app store, search "MyChart", open the app, select [facility], and log in with your MyChart username and password.  Masks are optional in the cancer centers. If you would like for your care team to wear a mask while they are taking care of you, please let them know. You may have one support person who is at least 46 years old accompany you for your appointments.  

## 2022-02-21 NOTE — Progress Notes (Addendum)
Pt presents today for Venofer IV iron. Pt took pre-meds Tylenol, Claritin, and Pepcid  at home prior to arrival pt refused to take Solu-Medrol '125mg'$ /53m IV.  Peripheral IV started with good blood return pre and post infusion.  Venofer 200 mg given today per MD orders. Tolerated infusion without adverse affects. Vital signs stable. No complaints at this time. Discharged from clinic ambulatory in stable condition. Alert and oriented x 3. [facility]

## 2022-02-24 ENCOUNTER — Ambulatory Visit (HOSPITAL_BASED_OUTPATIENT_CLINIC_OR_DEPARTMENT_OTHER): Payer: PPO | Admitting: Physical Therapy

## 2022-02-24 ENCOUNTER — Encounter (HOSPITAL_BASED_OUTPATIENT_CLINIC_OR_DEPARTMENT_OTHER): Payer: Self-pay | Admitting: Physical Therapy

## 2022-02-24 DIAGNOSIS — G8929 Other chronic pain: Secondary | ICD-10-CM

## 2022-02-24 DIAGNOSIS — M25552 Pain in left hip: Secondary | ICD-10-CM

## 2022-02-24 DIAGNOSIS — M25511 Pain in right shoulder: Secondary | ICD-10-CM

## 2022-02-24 DIAGNOSIS — M6281 Muscle weakness (generalized): Secondary | ICD-10-CM

## 2022-02-24 NOTE — Therapy (Signed)
OUTPATIENT PHYSICAL THERAPY LOWER EXTREMITY TREATMENT   Patient Name: Katelyn [name] MRN: [id number] DOB:[birth year], 46 y.o., female Today's Date: 02/24/2022   PT End of Session - 02/24/22 1146     Visit Number 5    Number of Visits 15    Date for PT Re-Evaluation 04/26/22    Authorization Type HTA    PT Start Time 1145    PT Stop Time 1215    PT Time Calculation (min) 30 min    Activity Tolerance Patient tolerated treatment well    Behavior During Therapy [facility] for tasks assessed/performed                Past Medical History:  Diagnosis Date   Anemia    Anorexia    Anxiety    Asthma    controlled with meds   Benign juvenile melanoma    Chronic headaches    Colon polyps    found on colonoscopy 63/87/5643   Complication of anesthesia    itching after epidural for c section   Constipation    Depression    Depression    several suicide attempts, hospitaluzed in 2012 for this; hx pf ECT treatments ; pt sees Dr. Adele Schilder pyschiatrist  and doing well on medication   Dysrhythmia    hx of PVC's (pt hasn't seen cardiologist since 2021- no follow up needed)   GERD (gastroesophageal reflux disease)    Headache    Heart murmur    as a child - no one mentions hearing murmur anymore, sometimes it is heard. Echo has been performed 02/03/18   Heartburn    no meds   History of pneumonia    x 3  years ago - no recent problems   Hx of blood clots    Left renal atrophy 12/19/2021   Neuromuscular disorder (HCC)    small fiber neuropathy   Obesity    Pneumonia    Polycystic ovary    takes metformin to treat   Renal atrophy, left 2023   Skin lesion    Excisional biopsy of moles - none cancerous   Sleep apnea    yrs ago - diagnosed mild sleep apnea - did not have to use cpap    Past Surgical History:  Procedure Laterality Date   BREAST CAPSULECTOMY Bilateral 12/13/2020   1 week later pt had hematoma surgery on left breast   BREAST ENHANCEMENT SURGERY Bilateral  02/11/2017   BREATH TEK H PYLORI N/A 07/22/2013   Procedure: BREATH TEK H PYLORI;  Surgeon: Edward Jolly, MD;  Location: [facility];  Service: General;  Laterality: N/A;   CESAREAN SECTION  2004, 2007   x 2   COLONOSCOPY  2013   CYSTOSCOPY WITH URETHRAL DILATATION  age 54    GASTRIC ROUX-EN-Y N/A 10/31/2013   Procedure: LAPAROSCOPIC ROUX-EN-Y GASTRIC BYPASS WITH UPPER ENDOSCOPY;  Surgeon: Edward Jolly, MD;  Location: [facility];  Service: General;  Laterality: N/A;   GLUTEUS MINIMUS REPAIR Left 05/28/2021   Procedure: Left [place];  Surgeon: Vanetta Mulders, MD;  Location: [facility];  Service: Orthopedics;  Laterality: Left;   ingrown toenail Right    Ingrown nail on great right toe   kidney stent  08/28/2014   mole excision     "benign juvenile melanoma" removed from left leg - inner thigh   POLYPECTOMY     2013   SKIN LESION EXCISION     back   TONSILLECTOMY  age 87  TUBAL LIGATION  2018   Patient Active Problem List   Diagnosis Date Noted   Left renal atrophy 12/19/2021   Stage 2 chronic kidney disease 11/20/2021   Meningitis 09/27/2021   Epigastric pain 09/27/2021   GERD (gastroesophageal reflux disease) 09/27/2021   Rash and nonspecific skin eruption 09/27/2021   Hypocalcemia 09/27/2021   Chronic pain syndrome 09/27/2021   Sepsis ([place])    Fever 09/25/2021   Tear of left gluteus medius tendon    Asthma 07/18/2020   Vitamin B 12 deficiency 10/16/2019   Elevated LFTs 10/16/2019   Asthma, exogenous, unspecified asthma severity, uncomplicated 68/02/5725   B12 deficiency anemia 06/21/2018   Venous insufficiency 04/23/2018   Onychomycosis of toenail 04/17/2018   Chronic myofascial pain 03/18/2018   Small fiber neuropathy 03/18/2018   Idiopathic small fiber sensory neuropathy 02/23/2018   Varicose veins of both lower extremities 01/13/2018   Cervical spondylosis without myelopathy 08/28/2017   Migraine with aura and without status migrainosus, not  intractable 07/22/2017   Lumbar degenerative disc disease 07/20/2017   Spondylosis of lumbar spine 07/20/2017   Coccydynia 03/12/2017   Bariatric surgery status 05/02/2016   Major depressive disorder, recurrent episode, moderate ([place]) 02/18/2016   Tachy-brady syndrome ([place]) 12/19/2015   Iron deficiency anemia 06/10/2015   Sleep disorder 06/01/2013   Generalized anxiety disorder 05/30/2013   Chronic headaches 07/01/2012   Headache 07/01/2012   Depression, major, recurrent, severe with psychosis ([place]) 06/17/2012   Severe recurrent major depressive disorder with psychotic symptoms ([place]) 06/17/2012   Family history of colonic polyps 03/03/2012   Eating disorder 12/18/2011   Nausea & vomiting 01/22/2010   Polycystic ovarian syndrome 09/01/2008   Intermittent left-sided chest pain 08/02/2008    REFERRING PROVIDER: Vanetta Mulders, MD  REFERRING DIAG:  M25.511,G89.29,M25.512 (ICD-10-CM) - Chronic pain of both shoulders  S76.012A (ICD-10-CM) - Tear of left gluteus medius tendon, initial encounter  B/L shoulder program ROM and strenghening  L hip strengthening program  THERAPY DIAG:  Pain in left hip  Chronic left shoulder pain  Acute pain of right shoulder  Muscle weakness (generalized)  Rationale for Evaluation and Treatment Rehabilitation  ONSET DATE: most recently June 2023  SUBJECTIVE:   SUBJECTIVE STATEMENT: I really notice how my left leg is weaker. Sharp pains into bilateral deltoids.   PERTINENT HISTORY: Sensory neuropathy, chronic pain, MDD, DDD  PAIN:  Are you having pain? Yes: NPRS scale: 7-shoulder, 3-hip/10 Pain location: bil shoulder, Lt hip Pain description: see subj Aggravating factors: see subj Relieving factors: see subj  PRECAUTIONS: Other: sensory neropathy Does not tolerate seated or supine positioning well.   WEIGHT BEARING RESTRICTIONS No  FALLS:  Has patient fallen in last 6 months? No  LIVING ENVIRONMENT: Lives with: lives with their  family  OCCUPATION: not working  PLOF: Independent with basic ADLs and Independent with community mobility without device  PATIENT GOALS decr pain, incr ability and stamina, out of the bed more, up steps, dressing    OBJECTIVE:   DIAGNOSTIC FINDINGS: bil shoulder xrays unremarkable on 12/06/21  PATIENT SURVEYS:  FOTO Hip 40, Shoulder 45  COGNITION:  Overall cognitive status: Within functional limits for tasks assessed     SENSATION: Lt UE neuropathy-painful to touch, light sensation in Rt UE LLE neuropathy, both feet have a little bit  POSTURE: weight off of LLE  PALPATION: TTP at Lt hip surgical site  LOWER EXTREMITY ROM:  Active ROM- in standing Right eval Left eval  Hip flexion 78 74  Hip extension  14 20  Hip abduction 20 18   (Blank rows = not tested)  LOWER EXTREMITY MMT:  MMT Right eval Left eval  Hip flexion    Hip extension    Hip abduction    Hip adduction    Hip internal rotation    Hip external rotation    Knee flexion    Knee extension    Ankle dorsiflexion    Ankle plantarflexion    Ankle inversion    Ankle eversion     (Blank rows = not tested)   FUNCTIONAL TESTS:  6MWT 1033 ft    RPE: 8/10; pain up to 8/10 on both sides (Lt more than Rt)  GAIT: Distance walked: Rt trendelenburg- pt verbalizes sensation of instability Assistive device utilized: none at eval  AROM Right EVAL  Left EVAL  Shoulder flexion 110 112  Shoulder extension 26 22  Shoulder abduction 108 100  Shoulder adduction    Shoulder internal rotation Below Sacrum mid buttock Just past neutral not behind body  Shoulder external rotation t2 c5  (Blank rows = not tested)  UEROM: Unable to hook bra behind back Difficulty shaving under arms, brushing hair, crossing arms hurts, unable to reach over either shoulder. *pt verbalized numbness in feet with end range overpressure in standing shoulder flexion   TODAY'S TREATMENT: Treatment                             10/30:  [place] AAROM with SPC Traction stretch for shoulders using stretch strap from door Forward pulleys in wide tandem Open book at wall Alt shoulder flexion +bil, 1lb each hand in mirror Discussed exercise repetition and awareness    Treatment                            02/20/22:  MANUAL: STM periscap, upper traps; passive scap mobilization; thoracic & rib mobs Prone scap retraction Prone glute squeezes Mini wall squats with physioball Open books Standing W with breathing for rib cage expansion with physioball Standing wall slides into scaphion  Plank/pull off for hip hinge at silver bar  Treatment                            02/07/22:  MANUAL: STM periscap, upper traps; passive scap mobilization; thoracic & rib mobs Prone scap retraction Standing W with breathing for rib cage expansion Standing pulleys into flexion Mini wall squat Plank/pull off for hip hinge at silver bar   PATIENT EDUCATION:  Education details: Geophysicist/field seismologist of condition, POC, HEP, exercise form/rationale Person educated: Patient Education method: Explanation, Demonstration, Tactile cues, Verbal cues, and Handouts Education comprehension: verbalized understanding, returned demonstration, verbal cues required, tactile cues required, and needs further education   HOME EXERCISE PROGRAM: [id number]  ASSESSMENT:  CLINICAL IMPRESSION: Tends to try and do all reps quickly in an exercise and is fatiguing past the point where she can regularly do exercises. Made adjustments to HEP today to improve tolerance and compliance. Has achieved all STGs.    OBJECTIVE IMPAIRMENTS decreased activity tolerance, difficulty walking, decreased ROM, decreased strength, increased muscle spasms, impaired flexibility, impaired sensation, improper body mechanics, postural dysfunction, and pain.   ACTIVITY LIMITATIONS carrying, lifting, bending, sitting, standing, squatting, sleeping, stairs, transfers, bed mobility, bathing,  toileting, dressing, reach over head, hygiene/grooming, locomotion level, and caring for others  PARTICIPATION LIMITATIONS: meal prep, cleaning, laundry,  driving, shopping, and community activity  PERSONAL FACTORS 3+ comorbidities: see PMH  are also affecting patient's functional outcome.     GOALS: Goals reviewed with patient? Yes  SHORT TERM GOALS: Target date: 02/21/2022  [facility] IR to midline bilaterally Baseline: Goal status: achieved when done passively, pt reported Lt AC joint pain  2.  Pt will verbalize awareness of equal WB and more frequent corrections Baseline:  Goal status: achieved  3.  Independent in HEP as it has been established in the short term Baseline:  Goal status: achieved   LONG TERM GOALS: Target date: 03/28/22  Pt will meet FOTO goals for hip and shoulder Baseline:  Goal status: INITIAL  2.  6 MWT to distance greater than 50-59 age normative values Baseline:  Goal status: INITIAL  3.  Able to fix hair independently pain <=3/10 Baseline:  Goal status: INITIAL  4.  Able to dress with minimal difficulty for shoulders and hip Baseline:  Goal status: INITIAL  5.  Able to navigate stairs with reciprocal pattern Baseline:  Goal status: INITIAL  6.  Able to tolerate ADLs without requiring long periods in her bed due to pain Baseline:  Goal status: INITIAL   PLAN: PT FREQUENCY: 1-2x/week  PT DURATION: 12 weeks  PLANNED INTERVENTIONS: Therapeutic exercises, Therapeutic activity, Neuromuscular re-education, Balance training, Gait training, Patient/Family education, Self Care, Joint mobilization, Stair training, Aquatic Therapy, Dry Needling, Electrical stimulation, Spinal mobilization, Cryotherapy, Moist heat, Taping, Manual therapy, and Re-evaluation  PLAN FOR NEXT SESSION: continue prone manual work  [employer] C. Deaken Jurgens PT, DPT 02/24/22 12:30 PM

## 2022-02-25 ENCOUNTER — Encounter: Payer: Self-pay | Admitting: *Deleted

## 2022-02-25 ENCOUNTER — Telehealth: Payer: Self-pay | Admitting: *Deleted

## 2022-02-25 NOTE — Patient Outreach (Signed)
M Care Coordination   Initial Visit Note   02/25/2022 Name: Katelyn [name] MRN: [id number] DOB: 18-Jul-1975  Katelyn [name] is a 46 y.o. year old female who sees Luking, Elayne Snare, MD for primary care. I spoke with  Katelyn [name] by phone today.  What matters to the patients health and wellness today?  I have a lot of health issues and I have 2 teenagers at home and one of them has special needs.   SDOH assessments and interventions completed:  Yes  SDOH Interventions Today    Flowsheet Row Most Recent Value  SDOH Interventions   Food Insecurity Interventions Intervention Not Indicated  Housing Interventions Intervention Not Indicated  Transportation Interventions --  [Pt manner of transportation needs to be assessed on home visit (if allowed). She has to utilize alot of pillows and cushions to be able to drive. When she arrives at some offices and parks it is difficult to ambulate in with her walker due to inclines]  Utilities Interventions Intervention Not Indicated  Depression Interventions/Treatment  Referral to Psychiatry, Medication, Counseling        Care Coordination Interventions Activated:  Yes  Care Coordination Interventions:  Yes, provided   Follow up plan: Follow up call scheduled for Friday.    Encounter Outcome:  Pt. Visit Completed   Katelyn Memos C. Katelyn Neither, MSN, [facility] Gerontological Nurse Practitioner [facility] Management [phone]

## 2022-02-27 DIAGNOSIS — F331 Major depressive disorder, recurrent, moderate: Secondary | ICD-10-CM | POA: Diagnosis not present

## 2022-02-27 DIAGNOSIS — F411 Generalized anxiety disorder: Secondary | ICD-10-CM | POA: Diagnosis not present

## 2022-02-27 MED FILL — Iron Sucrose Inj 20 MG/ML (Fe Equiv): INTRAVENOUS | Qty: 10 | Status: AC

## 2022-02-27 NOTE — Therapy (Signed)
OUTPATIENT PHYSICAL THERAPY FEMALE PELVIC EVALUATION   Patient Name: Katelyn [name] MRN: [id number] DOB:[birth year], 46 y.o., female Today's Date: 03/03/2022   PT End of Session - 03/03/22 0949     Visit Number 6   pelvic 1   Date for PT Re-Evaluation 04/26/22    Authorization Type HTA    PT Start Time 0930    PT Stop Time 0175    PT Time Calculation (min) 45 min    Activity Tolerance Patient tolerated treatment well    Behavior During Therapy [facility] for tasks assessed/performed             Past Medical History:  Diagnosis Date   Anemia    Anorexia    Anxiety    Asthma    controlled with meds   Benign juvenile melanoma    Chronic headaches    Colon polyps    found on colonoscopy 02/19/8526   Complication of anesthesia    itching after epidural for c section   Constipation    Depression    Depression    several suicide attempts, hospitaluzed in 2012 for this; hx pf ECT treatments ; pt sees Dr. Adele Schilder pyschiatrist  and doing well on medication   Dysrhythmia    hx of PVC's (pt hasn't seen cardiologist since 2021- no follow up needed)   GERD (gastroesophageal reflux disease)    Headache    Heart murmur    as a child - no one mentions hearing murmur anymore, sometimes it is heard. Echo has been performed 02/03/18   Heartburn    no meds   History of pneumonia    x 3  years ago - no recent problems   Hx of blood clots    Left renal atrophy 12/19/2021   Neuromuscular disorder (HCC)    small fiber neuropathy   Obesity    Pneumonia    Polycystic ovary    takes metformin to treat   Renal atrophy, left 2023   Skin lesion    Excisional biopsy of moles - none cancerous   Sleep apnea    yrs ago - diagnosed mild sleep apnea - did not have to use cpap    Past Surgical History:  Procedure Laterality Date   BREAST CAPSULECTOMY Bilateral 12/13/2020   1 week later pt had hematoma surgery on left breast   BREAST ENHANCEMENT SURGERY Bilateral 02/11/2017   BREATH TEK H  PYLORI N/A 07/22/2013   Procedure: BREATH TEK H PYLORI;  Surgeon: Edward Jolly, MD;  Location: [facility];  Service: General;  Laterality: N/A;   CESAREAN SECTION  2004, 2007   x 2   COLONOSCOPY  2013   CYSTOSCOPY WITH URETHRAL DILATATION  age 37    GASTRIC ROUX-EN-Y N/A 10/31/2013   Procedure: LAPAROSCOPIC ROUX-EN-Y GASTRIC BYPASS WITH UPPER ENDOSCOPY;  Surgeon: Edward Jolly, MD;  Location: [facility];  Service: General;  Laterality: N/A;   GLUTEUS MINIMUS REPAIR Left 05/28/2021   Procedure: Left [place];  Surgeon: Vanetta Mulders, MD;  Location: [facility];  Service: Orthopedics;  Laterality: Left;   ingrown toenail Right    Ingrown nail on great right toe   kidney stent  08/28/2014   mole excision     "benign juvenile melanoma" removed from left leg - inner thigh   POLYPECTOMY     2013   SKIN LESION EXCISION     back   TONSILLECTOMY  age 65   TUBAL LIGATION  2018   Patient  Active Problem List   Diagnosis Date Noted   Left renal atrophy 12/19/2021   Stage 2 chronic kidney disease 11/20/2021   Meningitis 09/27/2021   Epigastric pain 09/27/2021   GERD (gastroesophageal reflux disease) 09/27/2021   Rash and nonspecific skin eruption 09/27/2021   Hypocalcemia 09/27/2021   Chronic pain syndrome 09/27/2021   Sepsis ([place])    Fever 09/25/2021   Tear of left gluteus medius tendon    Asthma 07/18/2020   Vitamin B 12 deficiency 10/16/2019   Elevated LFTs 10/16/2019   Asthma, exogenous, unspecified asthma severity, uncomplicated 02/19/8526   B12 deficiency anemia 06/21/2018   Venous insufficiency 04/23/2018   Onychomycosis of toenail 04/17/2018   Chronic myofascial pain 03/18/2018   Small fiber neuropathy 03/18/2018   Idiopathic small fiber sensory neuropathy 02/23/2018   Varicose veins of both lower extremities 01/13/2018   Cervical spondylosis without myelopathy 08/28/2017   Migraine with aura and without status migrainosus, not intractable 07/22/2017    Lumbar degenerative disc disease 07/20/2017   Spondylosis of lumbar spine 07/20/2017   Coccydynia 03/12/2017   Bariatric surgery status 05/02/2016   Major depressive disorder, recurrent episode, moderate ([place]) 02/18/2016   Tachy-brady syndrome ([place]) 12/19/2015   Iron deficiency anemia 06/10/2015   Sleep disorder 06/01/2013   Generalized anxiety disorder 05/30/2013   Chronic headaches 07/01/2012   Headache 07/01/2012   Depression, major, recurrent, severe with psychosis ([place]) 06/17/2012   Severe recurrent major depressive disorder with psychotic symptoms ([place]) 06/17/2012   Family history of colonic polyps 03/03/2012   Eating disorder 12/18/2011   Nausea & vomiting 01/22/2010   Polycystic ovarian syndrome 09/01/2008   Intermittent left-sided chest pain 08/02/2008    PCP: Kathyrn Drown, MD   REFERRING PROVIDER: Robb Matar, LAT  REFERRING DIAG: M25.511,G89.29,M25.512 (ICD-10-CM) - Chronic pain of both shoulders S76.012A (ICD-10-CM) - Tear of left gluteus medius tendon, initial encounter  THERAPY DIAG:  Abnormal posture  Other muscle spasm  Unspecified lack of coordination  Rationale for Evaluation and Treatment: Rehabilitation  ONSET DATE: 2018  SUBJECTIVE:                                                                                                                                                                                           03/03/22 SUBJECTIVE STATEMENT: Pt had surgery for Lt glute med tear that most likely happened during fall 3 years ago. She has still had pain in Lt hip, but she is in rehab due to it. She had meningitis in June and ended up having Bil frozen shoulders. Her biggest complaint is of back pain, which impacts her bil shoulder pain because she  has to lay on either side. She mostly lies on Rt side, which causes pain in Rt low back and Rt hip. She is rarely getting pressure off of buttocks. She continues to have sacral/coccydynia with sitting and  avoids this position. She is not currently having vaginal/pelvic pain, but states this is coming and going - she manages by decreasing frequency of intercourse - no 1x/week. She is having issue with Lt kidney, but MD states that urinary issues are not related.  Is currently in PT for hip and shoulders - primary working on shoulders. Is only doing some basic leg strengthening and shoulder exercises at home.  Fluid intake: Yes: -    PAIN:  Are you having pain? Yes NPRS scale: 8/10Right and Left Pain location:   Pain type: sharp and pressure Pain description: constant   Aggravating factors: sitting, driving, finding comfortable position Relieving factors: lying on stomach relieves buttock pain if she can, but shoulders prevent this  PRECAUTIONS: None  WEIGHT BEARING RESTRICTIONS: No  FALLS:  Has patient fallen in last 6 months? No  LIVING ENVIRONMENT: Lives with: lives with their family Lives in: House/apartment  OCCUPATION: disabled  PLOF: Independent  PATIENT GOALS: would like to be able to sit; decrease bil hip/low back pain  PERTINENT HISTORY:  Breast augmentation surgeries with follow-up reduction with complication, c/s, gastric bypass, bil tubal ligation, long history of chronic low back/pelvic pain, pudendal neuralgia, coccydynia, Lt glute med repair Sexual abuse: No  BOWEL MOVEMENT: Pain with bowel movement: No Type of bowel movement:Frequency 1x/day and Strain Yes Fully empty rectum: Yes: - Leakage: No Pads: No Fiber supplement: No no medication to help stimulate bowel movements  URINATION: Pain with urination: No Fully empty bladder: No Stream:  trouble starting the stream Urgency: no Frequency: every 30 minutes to an hour - worse once she does go, then she has to keep going - easier to just hold it Leakage:  none Pads: No  INTERCOURSE: Pain with intercourse: Initial Penetration and During Penetration Ability to have vaginal penetration:  Yes: pain - only  tolerates 1x/week Climax: yes Marinoff Scale: 2/3  PREGNANCY: Vaginal deliveries 0 Tearing No C-section deliveries 2 Currently pregnant No    OBJECTIVE:  03/03/22: DIAGNOSTIC FINDINGS:  Lasix renogram with no abnormal findings  PATIENT SURVEYS:   PFIQ-7 100%  COGNITION: Overall cognitive status: Within functional limits for tasks assessed     SENSATION: Light touch: Appears intact Proprioception: Appears intact  MUSCLE LENGTH:   FUNCTIONAL TESTS:   5xSTS: 29 seconds  GAIT:  Comments: decreased bil hip extension, posterior pelvic tilt  POSTURE: rounded shoulders, forward head, increased lumbar lordosis, and posterior pelvic tilt  PELVIC ALIGNMENT:  LUMBARAROM/PROM:  A/PROM A/PROM  Eval (%)  Flexion 50, pain on Rt  Extension 20, no pain, very limited mobility  Right lateral flexion 50, pain in Rt low back  Left lateral flexion 75, pain in Rt low back  Right rotation 50, good stretch  Left rotation 50, good stretch   (Blank rows = not tested)   LOWER EXTREMITY MMT: tested in standing due to comfort  Rt: 3/5 hip abduction and extension, 4/5 adduction and flexion  Lt: 3/5 hip abduction, extension, flexion, 4/5 adduction   PALPATION:   General  tenderness throughout bil lumbar paraspinals and gluts, Lt>Rt; tenderness and spasm in thoracic paraspinals with decreased mobility upon deep breathing; scar tissue restriction under bil breasts that is tender; scar tissue restriction Lt glut med surgical incision  External Perineal Exam NA                             Internal Pelvic Floor NA  Patient confirms identification and approves PT to assess internal pelvic floor and treatment Yes for future need  PELVIC MMT:   MMT eval  Vaginal   Internal Anal Sphincter   External Anal Sphincter   Puborectalis   Diastasis Recti   (Blank rows = not tested)        TONE: NA  PROLAPSE: NA  TODAY'S TREATMENT:                                                                                                                               DATE: 03/03/22  EVAL  Neuromuscular re-education: Down training: Diaphragmatic breathing for anxiety/pain management and improved abdominal/thoracic mobility Self-care: Self-scar tissue mobilization for bil breasts and Lt hip - light touch to tolerance, 5 min a day    PATIENT EDUCATION:  Education details: see above self-care Person educated: Patient Education method: Explanation, Demonstration, Tactile cues, Verbal cues, and Handouts Education comprehension: verbalized understanding  HOME EXERCISE PROGRAM: NA  ASSESSMENT:  CLINICAL IMPRESSION: Patient is a 46 y.o. female who was seen today for physical therapy evaluation and treatment for pelvic pain (and bil hip/low back pain), difficulty with urinary voiding, functional difficulty with sitting, lying down, and mobility. Exam findings focused externally today with significant limitations in lumbar A/ROM, decreased bil hip strength, tenderness throughout bil hips, lumbar musculature, and sacral/coccyx borders, scar tissue restriction distal to bil breasts/Lt hip, and poor breathing patterns/restricted abdominal wall/thoracic spine/ribs. Signs and symptoms are most consistent with chronic pain associated with fall that tore Lt glute med and caused tailbone injury, years of weakness due to this with associated muscle spasm, and alternate movement patterns and avoidance behaviors. Initial treatment consisted of education on self-scar tissue mobilization and diaphragmatic breathing. She will benefit from skilled PT intervention in order to decrease pain, improve functional ability, increase ease of starting stream/emptying bladder, and improve QOL.   OBJECTIVE IMPAIRMENTS: decreased activity tolerance, decreased coordination, decreased endurance, decreased mobility, decreased ROM, decreased strength, hypomobility, increased fascial restrictions, increased  muscle spasms, impaired flexibility, impaired tone, postural dysfunction, and pain.   ACTIVITY LIMITATIONS: bending, sitting, standing, stairs, transfers, bed mobility, and locomotion level  PARTICIPATION LIMITATIONS: cleaning, laundry, interpersonal relationship, driving, and community activity  PERSONAL FACTORS: 3+ comorbidities: Breast augmentation surgeries with follow-up reduction with complication, c/s, gastric bypass, bil tubal ligation, long history of chronic low back/pelvic pain, pudendal neuralgia, coccydynia, Lt glut med repair  are also affecting patient's functional outcome.   REHAB POTENTIAL: Good  CLINICAL DECISION MAKING: Stable/uncomplicated  EVALUATION COMPLEXITY: Low   GOALS: Goals reviewed with patient? Yes  SHORT TERM GOALS: Target date: 03/31/2022  Pt will be independent with HEP.   Baseline: Goal status: INITIAL  2.  Pt will be independent with  diaphragmatic breathing and down training activities in order to improve pelvic floor relaxation and abdominal wall mobility.  Baseline:  Goal status: INITIAL  3.  Pt will report improve stream initiation and complete voiding with urination in order to improve QOL. Baseline:  Goal status: INITIAL   4.  Pt will increase all impaired lumbar A/ROM by 25% without pain.  Baseline:  Goal status: INITIAL  LONG TERM GOALS: Target date: 04/28/2022   Pt will be independent with advanced HEP.   Baseline:  Goal status: INITIAL  2.  Pt will be able to use regular toilet without difficulty. Baseline: Using high toilet chair Goal status: INITIAL  3.  Pt will be able to ascend/descend 12 steps at regular intervals throughout the day safely and with minimal difficulty.  Baseline:  Goal status: INITIAL  4.  Pt will improve 5xSTS to less than 20 seconds and improved PFIQ by 25% in order to demonstrate improved QOL and functional ability.  Baseline: 28 seconds Goal status: INITIAL  5.  Patient will report no greater  than 5/10 pain with sitting in order to more easily attend appointments, drive, and spend time with family.  Baseline:  Goal status: INITIAL  6.  Pt will demonstrate increase in all impaired hip strength by 1 muscle grades in order to demonstrate improved lumbopelvic support and increase functional ability.    Baseline:  Goal status: INITIAL  PLAN:  PT FREQUENCY: 1x/week  PT DURATION: 8 weeks  PLANNED INTERVENTIONS: Therapeutic exercises, Therapeutic activity, Neuromuscular re-education, Balance training, Gait training, Patient/Family education, Self Care, Joint mobilization, Aquatic Therapy, Dry Needling, Biofeedback, and Manual therapy  PLAN FOR NEXT SESSION: Begin manual techniques to help address pain and scar tissue restriction; begin gentle mobility and strengthening activities.    Heather Roberts, PT, DPT11/09/2309:40 AM

## 2022-02-28 ENCOUNTER — Telehealth: Payer: Self-pay | Admitting: *Deleted

## 2022-02-28 ENCOUNTER — Encounter: Payer: Self-pay | Admitting: *Deleted

## 2022-02-28 ENCOUNTER — Inpatient Hospital Stay: Payer: PPO | Attending: Physician Assistant

## 2022-02-28 ENCOUNTER — Encounter (HOSPITAL_BASED_OUTPATIENT_CLINIC_OR_DEPARTMENT_OTHER): Payer: PPO | Admitting: Physical Therapy

## 2022-02-28 VITALS — BP 132/78 | HR 76 | Temp 97.6°F | Resp 18

## 2022-02-28 DIAGNOSIS — D508 Other iron deficiency anemias: Secondary | ICD-10-CM | POA: Insufficient documentation

## 2022-02-28 MED ORDER — SODIUM CHLORIDE 0.9 % IV SOLN
200.0000 mg | Freq: Once | INTRAVENOUS | Status: AC
  Administered 2022-02-28: 200 mg via INTRAVENOUS
  Filled 2022-02-28: qty 10

## 2022-02-28 MED ORDER — SODIUM CHLORIDE 0.9 % IV SOLN: Freq: Once | INTRAVENOUS | Status: AC

## 2022-02-28 NOTE — Patient Outreach (Signed)
  Care Coordination   02/28/2022 Name: Katelyn [name] MRN: [id number] DOB: 03/06/1976   Care Coordination Outreach Attempts:  An unsuccessful telephone outreach was attempted today to offer the patient information about available care coordination services as a benefit of their health plan.   Follow Up Plan:  Additional outreach attempts will be made to offer the patient care coordination information and services.  Left 2 messages today on pt voice mail. Requested permission to visit with her next Tuesday, November 7th. Will call again on Monday.  Encounter Outcome:  No Answer  Care Coordination Interventions Activated:  No   Care Coordination Interventions:  No, not indicated    SIG Lucie Friedlander C. Myrtie Neither, MSN, [facility] Gerontological Nurse Practitioner [facility] Care Management [phone]

## 2022-02-28 NOTE — Patient Instructions (Signed)
[facility]  Discharge Instructions: Thank you for choosing [facility] to provide your oncology and hematology care.  If you have a lab appointment with the Cancer Center, please come in thru the Main Entrance and check in at the main information desk.  Wear comfortable clothing and clothing appropriate for easy access to any Portacath or PICC line.   We strive to give you quality time with your provider. You may need to reschedule your appointment if you arrive late (15 or more minutes).  Arriving late affects you and other patients whose appointments are after yours.  Also, if you miss three or more appointments without notifying the office, you may be dismissed from the clinic at the provider's discretion.      For prescription refill requests, have your pharmacy contact our office and allow 72 hours for refills to be completed.     To help prevent nausea and vomiting after your treatment, we encourage you to take your nausea medication as directed.  BELOW ARE SYMPTOMS THAT SHOULD BE REPORTED IMMEDIATELY: *FEVER GREATER THAN 100.4 F (38 C) OR HIGHER *CHILLS OR SWEATING *NAUSEA AND VOMITING THAT IS NOT CONTROLLED WITH YOUR NAUSEA MEDICATION *UNUSUAL SHORTNESS OF BREATH *UNUSUAL BRUISING OR BLEEDING *URINARY PROBLEMS (pain or burning when urinating, or frequent urination) *BOWEL PROBLEMS (unusual diarrhea, constipation, pain near the anus) TENDERNESS IN MOUTH AND THROAT WITH OR WITHOUT PRESENCE OF ULCERS (sore throat, sores in mouth, or a toothache) UNUSUAL RASH, SWELLING OR PAIN  UNUSUAL VAGINAL DISCHARGE OR ITCHING   Items with * indicate a potential emergency and should be followed up as soon as possible or go to the Emergency Department if any problems should occur.  Please show the CHEMOTHERAPY ALERT CARD or IMMUNOTHERAPY ALERT CARD at check-in to the Emergency Department and triage nurse.  Should you have questions after your visit or need to  cancel or reschedule your appointment, please contact [facility] [phone]  and follow the prompts.  Office hours are 8:00 a.m. to 4:30 p.m. Monday - Friday. Please note that voicemails left after 4:00 p.m. may not be returned until the following business day.  We are closed weekends and major holidays. You have access to a nurse at all times for urgent questions. Please call the main number to the clinic [phone] and follow the prompts.  For any non-urgent questions, you may also contact your provider using MyChart. We now offer e-Visits for anyone 18 and older to request care online for non-urgent symptoms. For details visit [email or web address].   Also download the MyChart app! Go to the app store, search "MyChart", open the app, select [place], and log in with your MyChart username and password.  Masks are optional in the cancer centers. If you would like for your care team to wear a mask while they are taking care of you, please let them know. You may have one support person who is at least 46 years old accompany you for your appointments.  

## 2022-02-28 NOTE — Progress Notes (Signed)
Patient presents today for iron infusion.  Patient is in satisfactory condition with no new complaints voiced. Vital signs are stable.  Pre-medications were taken at home at 0700 prior to visit.  We will proceed with infusion per provider orders.   Patient tolerated treatment well with no complaints voiced.  Patient left ambulatory in stable condition.  Vital signs stable at discharge.  Follow up as scheduled.

## 2022-03-03 ENCOUNTER — Ambulatory Visit: Payer: Self-pay | Admitting: *Deleted

## 2022-03-03 ENCOUNTER — Ambulatory Visit: Payer: PPO | Attending: Urology

## 2022-03-03 ENCOUNTER — Other Ambulatory Visit: Payer: Self-pay

## 2022-03-03 DIAGNOSIS — M6281 Muscle weakness (generalized): Secondary | ICD-10-CM | POA: Insufficient documentation

## 2022-03-03 DIAGNOSIS — R293 Abnormal posture: Secondary | ICD-10-CM | POA: Insufficient documentation

## 2022-03-03 DIAGNOSIS — N261 Atrophy of kidney (terminal): Secondary | ICD-10-CM | POA: Insufficient documentation

## 2022-03-03 DIAGNOSIS — R3915 Urgency of urination: Secondary | ICD-10-CM | POA: Diagnosis not present

## 2022-03-03 DIAGNOSIS — M62838 Other muscle spasm: Secondary | ICD-10-CM | POA: Diagnosis not present

## 2022-03-03 DIAGNOSIS — N1330 Unspecified hydronephrosis: Secondary | ICD-10-CM | POA: Diagnosis not present

## 2022-03-03 DIAGNOSIS — R279 Unspecified lack of coordination: Secondary | ICD-10-CM | POA: Insufficient documentation

## 2022-03-03 NOTE — Patient Outreach (Signed)
  Care Coordination   Follow Up Visit Note   03/03/2022 Name: Katelyn [name] MRN: [id number] DOB: Jul 21, 1975  Katelyn [name] is a 46 y.o. year old female who sees Luking, Elayne Snare, MD for primary care. I spoke with  [name] by phone today. HOWEVER, SHE HAD HAD A DEATH IN HER FAMILY AND SHE WOULD LIKE TO RESCHEDULE FOR FRIDAY.  Follow up plan: Follow up call scheduled for FRIDAY AFTERNOON    Encounter Outcome:  Pt. Visit Completed   [name] C. Myrtie Neither, MSN, [facility] Gerontological Nurse Practitioner [facility] Care Management [phone]

## 2022-03-06 DIAGNOSIS — F411 Generalized anxiety disorder: Secondary | ICD-10-CM | POA: Diagnosis not present

## 2022-03-06 DIAGNOSIS — F331 Major depressive disorder, recurrent, moderate: Secondary | ICD-10-CM | POA: Diagnosis not present

## 2022-03-07 ENCOUNTER — Ambulatory Visit (INDEPENDENT_AMBULATORY_CARE_PROVIDER_SITE_OTHER): Payer: PPO | Admitting: Orthopaedic Surgery

## 2022-03-07 ENCOUNTER — Encounter: Payer: Self-pay | Admitting: *Deleted

## 2022-03-07 ENCOUNTER — Inpatient Hospital Stay: Payer: PPO

## 2022-03-07 VITALS — BP 144/72 | HR 88 | Temp 97.5°F | Resp 18

## 2022-03-07 DIAGNOSIS — M7500 Adhesive capsulitis of unspecified shoulder: Secondary | ICD-10-CM

## 2022-03-07 DIAGNOSIS — D508 Other iron deficiency anemias: Secondary | ICD-10-CM | POA: Diagnosis not present

## 2022-03-07 MED ORDER — SODIUM CHLORIDE 0.9 % IV SOLN: Freq: Once | INTRAVENOUS | Status: AC

## 2022-03-07 MED ORDER — SODIUM CHLORIDE 0.9 % IV SOLN
200.0000 mg | Freq: Once | INTRAVENOUS | Status: AC
  Administered 2022-03-07: 200 mg via INTRAVENOUS
  Filled 2022-03-07: qty 10

## 2022-03-07 NOTE — Progress Notes (Signed)
Patient presents today for iron infusion.  Patient is in satisfactory condition with no new complaints voiced.  Vital signs are stable.  Pepcid, Tylenol and Claritin were taken at home at 0930 prior to office visit.  Patient is refusing steroid.  We will proceed with infusion per provider orders.   Patient tolerated treatment well with no complaints voiced.  Patient left ambulatory in stable condition.  Vital signs stable at discharge.  Follow up as scheduled.
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
## 2022-03-07 NOTE — Patient Instructions (Signed)
[facility]  Discharge Instructions: Thank you for choosing [facility] to provide your oncology and hematology care.  If you have a lab appointment with the Cancer Center, please come in thru the Main Entrance and check in at the main information desk.  Wear comfortable clothing and clothing appropriate for easy access to any Portacath or PICC line.   We strive to give you quality time with your provider. You may need to reschedule your appointment if you arrive late (15 or more minutes).  Arriving late affects you and other patients whose appointments are after yours.  Also, if you miss three or more appointments without notifying the office, you may be dismissed from the clinic at the provider's discretion.      For prescription refill requests, have your pharmacy contact our office and allow 72 hours for refills to be completed.     To help prevent nausea and vomiting after your treatment, we encourage you to take your nausea medication as directed.  BELOW ARE SYMPTOMS THAT SHOULD BE REPORTED IMMEDIATELY: *FEVER GREATER THAN 100.4 F (38 C) OR HIGHER *CHILLS OR SWEATING *NAUSEA AND VOMITING THAT IS NOT CONTROLLED WITH YOUR NAUSEA MEDICATION *UNUSUAL SHORTNESS OF BREATH *UNUSUAL BRUISING OR BLEEDING *URINARY PROBLEMS (pain or burning when urinating, or frequent urination) *BOWEL PROBLEMS (unusual diarrhea, constipation, pain near the anus) TENDERNESS IN MOUTH AND THROAT WITH OR WITHOUT PRESENCE OF ULCERS (sore throat, sores in mouth, or a toothache) UNUSUAL RASH, SWELLING OR PAIN  UNUSUAL VAGINAL DISCHARGE OR ITCHING   Items with * indicate a potential emergency and should be followed up as soon as possible or go to the Emergency Department if any problems should occur.  Please show the CHEMOTHERAPY ALERT CARD or IMMUNOTHERAPY ALERT CARD at check-in to the Emergency Department and triage nurse.  Should you have questions after your visit or need to  cancel or reschedule your appointment, please contact [facility] [phone]  and follow the prompts.  Office hours are 8:00 a.m. to 4:30 p.m. Monday - Friday. Please note that voicemails left after 4:00 p.m. may not be returned until the following business day.  We are closed weekends and major holidays. You have access to a nurse at all times for urgent questions. Please call the main number to the clinic [phone] and follow the prompts.  For any non-urgent questions, you may also contact your provider using MyChart. We now offer e-Visits for anyone 18 and older to request care online for non-urgent symptoms. For details visit [email or web address].   Also download the MyChart app! Go to the app store, search "MyChart", open the app, select [facility], and log in with your MyChart username and password.  Masks are optional in the cancer centers. If you would like for your care team to wear a mask while they are taking care of you, please let them know. You may have one support person who is at least 46 years old accompany you for your appointments.  

## 2022-03-09 DIAGNOSIS — Z23 Encounter for immunization: Secondary | ICD-10-CM | POA: Diagnosis not present

## 2022-03-10 ENCOUNTER — Encounter: Payer: Self-pay | Admitting: Hematology

## 2022-03-10 NOTE — Therapy (Signed)
OUTPATIENT PHYSICAL THERAPY TREATMENT NOTE   Patient Name: Katelyn [name] MRN: [id number] DOB:March 24, 1976, 46 y.o., female Today's Date: 03/11/2022  PCP: Kathyrn Drown, MD  REFERRING PROVIDER: Vira Agar, MD  END OF SESSION:   PT End of Session - 03/11/22 1401     Visit Number 7   2 pelvic   Date for PT Re-Evaluation 04/26/22    Authorization Type HTA    PT Start Time 1400    PT Stop Time 4034    PT Time Calculation (min) 45 min    Activity Tolerance Patient tolerated treatment well    Behavior During Therapy [facility] for tasks assessed/performed             Past Medical History:  Diagnosis Date   Anemia    Anorexia    Anxiety    Asthma    controlled with meds   Benign juvenile melanoma    Chronic headaches    Colon polyps    found on colonoscopy 74/25/9563   Complication of anesthesia    itching after epidural for c section   Constipation    Depression    Depression    several suicide attempts, hospitaluzed in 2012 for this; hx pf ECT treatments ; pt sees Dr. Adele Schilder pyschiatrist  and doing well on medication   Dysrhythmia    hx of PVC's (pt hasn't seen cardiologist since 2021- no follow up needed)   GERD (gastroesophageal reflux disease)    Headache    Heart murmur    as a child - no one mentions hearing murmur anymore, sometimes it is heard. Echo has been performed 02/03/18   Heartburn    no meds   History of pneumonia    x 3  years ago - no recent problems   Hx of blood clots    Left renal atrophy 12/19/2021   Neuromuscular disorder (HCC)    small fiber neuropathy   Obesity    Pneumonia    Polycystic ovary    takes metformin to treat   Renal atrophy, left 2023   Skin lesion    Excisional biopsy of moles - none cancerous   Sleep apnea    yrs ago - diagnosed mild sleep apnea - did not have to use cpap    Past Surgical History:  Procedure Laterality Date   BREAST CAPSULECTOMY Bilateral 12/13/2020   1 week later pt had hematoma surgery on  left breast   BREAST ENHANCEMENT SURGERY Bilateral 02/11/2017   BREATH TEK H PYLORI N/A 07/22/2013   Procedure: BREATH TEK H PYLORI;  Surgeon: Edward Jolly, MD;  Location: [facility];  Service: General;  Laterality: N/A;   CESAREAN SECTION  2004, 2007   x 2   COLONOSCOPY  2013   CYSTOSCOPY WITH URETHRAL DILATATION  age 46    GASTRIC ROUX-EN-Y N/A 10/31/2013   Procedure: LAPAROSCOPIC ROUX-EN-Y GASTRIC BYPASS WITH UPPER ENDOSCOPY;  Surgeon: Edward Jolly, MD;  Location: [facility];  Service: General;  Laterality: N/A;   GLUTEUS MINIMUS REPAIR Left 05/28/2021   Procedure: Left [place];  Surgeon: Vanetta Mulders, MD;  Location: [place];  Service: Orthopedics;  Laterality: Left;   ingrown toenail Right    Ingrown nail on great right toe   kidney stent  08/28/2014   mole excision     "benign juvenile melanoma" removed from left leg - inner thigh   POLYPECTOMY     2013   SKIN LESION EXCISION  back   TONSILLECTOMY  age 51   TUBAL LIGATION  2018   Patient Active Problem List   Diagnosis Date Noted   Left renal atrophy 12/19/2021   Stage 2 chronic kidney disease 11/20/2021   Meningitis 09/27/2021   Epigastric pain 09/27/2021   GERD (gastroesophageal reflux disease) 09/27/2021   Rash and nonspecific skin eruption 09/27/2021   Hypocalcemia 09/27/2021   Chronic pain syndrome 09/27/2021   Sepsis ([place])    Fever 09/25/2021   Tear of left gluteus medius tendon    Asthma 07/18/2020   Vitamin B 12 deficiency 10/16/2019   Elevated LFTs 10/16/2019   Asthma, exogenous, unspecified asthma severity, uncomplicated 10/22/9483   B12 deficiency anemia 06/21/2018   Venous insufficiency 04/23/2018   Onychomycosis of toenail 04/17/2018   Chronic myofascial pain 03/18/2018   Small fiber neuropathy 03/18/2018   Idiopathic small fiber sensory neuropathy 02/23/2018   Varicose veins of both lower extremities 01/13/2018   Cervical spondylosis without myelopathy 08/28/2017    Migraine with aura and without status migrainosus, not intractable 07/22/2017   Lumbar degenerative disc disease 07/20/2017   Spondylosis of lumbar spine 07/20/2017   Coccydynia 03/12/2017   Bariatric surgery status 05/02/2016   Major depressive disorder, recurrent episode, moderate ([place]) 02/18/2016   Tachy-brady syndrome ([place]) 12/19/2015   Iron deficiency anemia 06/10/2015   Sleep disorder 06/01/2013   Generalized anxiety disorder 05/30/2013   Chronic headaches 07/01/2012   Headache 07/01/2012   Depression, major, recurrent, severe with psychosis ([place]) 06/17/2012   Severe recurrent major depressive disorder with psychotic symptoms ([place]) 06/17/2012   Family history of colonic polyps 03/03/2012   Eating disorder 12/18/2011   Nausea & vomiting 01/22/2010   Polycystic ovarian syndrome 09/01/2008   Intermittent left-sided chest pain 08/02/2008    REFERRING DIAG: M25.511,G89.29,M25.512 (ICD-10-CM) - Chronic pain of both shoulders S76.012A (ICD-10-CM) - Tear of left gluteus medius tendon, initial encounter  THERAPY DIAG:  Abnormal posture  Muscle weakness (generalized)  Other muscle spasm  Unspecified lack of coordination  Rationale for Evaluation and Treatment Rehabilitation  PERTINENT HISTORY: Breast augmentation surgeries with follow-up reduction with complication, c/s, gastric bypass, bil tubal ligation, long history of chronic low back/pelvic pain, pudendal neuralgia, coccydynia, Lt glute med repair  PRECAUTIONS: NA  SUBJECTIVE:                                                                                                                                                                                      SUBJECTIVE STATEMENT:  pt reports no changes, Lt hip is aggravated by riding in the car over here.    PAIN:  Are you having pain? Yes, 7/10, Lt hip and  low back   03/03/22 SUBJECTIVE STATEMENT: Pt had surgery for Lt glute med tear that most likely happened during fall  3 years ago. She has still had pain in Lt hip, but she is in rehab due to it. She had meningitis in June and ended up having Bil frozen shoulders. Her biggest complaint is of back pain, which impacts her bil shoulder pain because she has to lay on either side. She mostly lies on Rt side, which causes pain in Rt low back and Rt hip. She is rarely getting pressure off of buttocks. She continues to have sacral/coccydynia with sitting and avoids this position. She is not currently having vaginal/pelvic pain, but states this is coming and going - she manages by decreasing frequency of intercourse - no 1x/week. She is having issue with Lt kidney, but MD states that urinary issues are not related.  Is currently in PT for hip and shoulders - primary working on shoulders. Is only doing some basic leg strengthening and shoulder exercises at home.  Fluid intake: Yes: -     PAIN:  Are you having pain? Yes NPRS scale: 8/10Right and Left Pain location:    Pain type: sharp and pressure Pain description: constant    Aggravating factors: sitting, driving, finding comfortable position Relieving factors: lying on stomach relieves buttock pain if she can, but shoulders prevent this   PRECAUTIONS: None   WEIGHT BEARING RESTRICTIONS: No   FALLS:  Has patient fallen in last 6 months? No   LIVING ENVIRONMENT: Lives with: lives with their family Lives in: House/apartment   OCCUPATION: disabled   PLOF: Independent   PATIENT GOALS: would like to be able to sit; decrease bil hip/low back pain   PERTINENT HISTORY:  Breast augmentation surgeries with follow-up reduction with complication, c/s, gastric bypass, bil tubal ligation, long history of chronic low back/pelvic pain, pudendal neuralgia, coccydynia, Lt glute med repair Sexual abuse: No   BOWEL MOVEMENT: Pain with bowel movement: No Type of bowel movement:Frequency 1x/day and Strain Yes Fully empty rectum: Yes: - Leakage: No Pads: No Fiber  supplement: No no medication to help stimulate bowel movements   URINATION: Pain with urination: No Fully empty bladder: No Stream:  trouble starting the stream Urgency: no Frequency: every 30 minutes to an hour - worse once she does go, then she has to keep going - easier to just hold it Leakage:  none Pads: No   INTERCOURSE: Pain with intercourse: Initial Penetration and During Penetration Ability to have vaginal penetration:  Yes: pain - only tolerates 1x/week Climax: yes Marinoff Scale: 2/3   PREGNANCY: Vaginal deliveries 0 Tearing No C-section deliveries 2 Currently pregnant No       OBJECTIVE:  03/03/22: DIAGNOSTIC FINDINGS:  Lasix renogram with no abnormal findings   PATIENT SURVEYS:    PFIQ-7 100%   COGNITION: Overall cognitive status: Within functional limits for tasks assessed                          SENSATION: Light touch: Appears intact Proprioception: Appears intact   MUSCLE LENGTH:     FUNCTIONAL TESTS:              5xSTS: 29 seconds   GAIT:   Comments: decreased bil hip extension, posterior pelvic tilt   POSTURE: rounded shoulders, forward head, increased lumbar lordosis, and posterior pelvic tilt   PELVIC ALIGNMENT:   LUMBARAROM/PROM:   A/PROM A/PROM  Eval (%)  Flexion 50,  pain on Rt  Extension 20, no pain, very limited mobility  Right lateral flexion 50, pain in Rt low back  Left lateral flexion 75, pain in Rt low back  Right rotation 50, good stretch  Left rotation 50, good stretch   (Blank rows = not tested)     LOWER EXTREMITY MMT: tested in standing due to comfort             Rt: 3/5 hip abduction and extension, 4/5 adduction and flexion             Lt: 3/5 hip abduction, extension, flexion, 4/5 adduction     PALPATION:   General  tenderness throughout bil lumbar paraspinals and gluts, Lt>Rt; tenderness and spasm in thoracic paraspinals with decreased mobility upon deep breathing; scar tissue restriction under bil breasts  that is tender; scar tissue restriction Lt glut med surgical incision                 External Perineal Exam NA                             Internal Pelvic Floor NA   Patient confirms identification and approves PT to assess internal pelvic floor and treatment Yes for future need   PELVIC MMT:   MMT eval  Vaginal    Internal Anal Sphincter    External Anal Sphincter    Puborectalis    Diastasis Recti    (Blank rows = not tested)         TONE: NA   PROLAPSE: NA   TODAY'S TREATMENT 03/11/22: Manual: Soft tissue mobilization: Lt hip, bil lumbar paraspinals Scar tissue mobilization: Lt hip incision Lt breast incision Neuromuscular re-education: Core retraining:  Transversus abdominus training with multimodal cues for improved motor control and breath coordination Core facilitation: Pelvic tilts with breath coordination and abdominal activation with exhale Standing march 2 x 10 Exercises: Stretches/mobility: Standing hip circles bil, 2 x 10 Self-care: Self-scar tissue massage                                                                                                                              DATE: 03/03/22  EVAL  Neuromuscular re-education: Down training: Diaphragmatic breathing for anxiety/pain management and improved abdominal/thoracic mobility Self-care: Self-scar tissue mobilization for bil breasts and Lt hip - light touch to tolerance, 5 min a day       PATIENT EDUCATION:  Education details: see above self-care Person educated: Patient Education method: Explanation, Demonstration, Tactile cues, Verbal cues, and Handouts Education comprehension: verbalized understanding   HOME EXERCISE PROGRAM: Written handout   ASSESSMENT:   CLINICAL IMPRESSION: Patient saw no change after initial evaluation. We reviewed scar tissue mobilization and she was encouraged not to press quite as hard to minimize soreness. Manual techniques performed to scar tissue,  lumbar paraspinals, and Lt gluteals with excellent tolerance and starting to see some improvement  in tissue restriction. She did have increase in pain from 7/10 to 8/10, but states that it was a good increase in pain/soreness. She was able to start core training with progression to standing march with good activation. Some discomfort with hip circles in Rt SIJ; she was encouraged to keep circles small. She will continue to benefit from skilled PT intervention in order to decrease pain, improve functional ability, increase ease of starting stream/emptying bladder, and improve QOL.    OBJECTIVE IMPAIRMENTS: decreased activity tolerance, decreased coordination, decreased endurance, decreased mobility, decreased ROM, decreased strength, hypomobility, increased fascial restrictions, increased muscle spasms, impaired flexibility, impaired tone, postural dysfunction, and pain.    ACTIVITY LIMITATIONS: bending, sitting, standing, stairs, transfers, bed mobility, and locomotion level   PARTICIPATION LIMITATIONS: cleaning, laundry, interpersonal relationship, driving, and community activity   PERSONAL FACTORS: 3+ comorbidities: Breast augmentation surgeries with follow-up reduction with complication, c/s, gastric bypass, bil tubal ligation, long history of chronic low back/pelvic pain, pudendal neuralgia, coccydynia, Lt glut med repair  are also affecting patient's functional outcome.    REHAB POTENTIAL: Good   CLINICAL DECISION MAKING: Stable/uncomplicated   EVALUATION COMPLEXITY: Low     GOALS: Goals reviewed with patient? Yes   SHORT TERM GOALS: Target date: 03/31/2022   Pt will be independent with HEP.    Baseline: Goal status: INITIAL   2.  Pt will be independent with diaphragmatic breathing and down training activities in order to improve pelvic floor relaxation and abdominal wall mobility.   Baseline:  Goal status: INITIAL   3.  Pt will report improve stream initiation and complete  voiding with urination in order to improve QOL. Baseline:  Goal status: INITIAL              4.  Pt will increase all impaired lumbar A/ROM by 25% without pain.   Baseline:  Goal status: INITIAL   LONG TERM GOALS: Target date: 04/28/2022    Pt will be independent with advanced HEP.    Baseline:  Goal status: INITIAL   2.  Pt will be able to use regular toilet without difficulty. Baseline: Using high toilet chair Goal status: INITIAL   3.  Pt will be able to ascend/descend 12 steps at regular intervals throughout the day safely and with minimal difficulty.  Baseline:  Goal status: INITIAL   4.  Pt will improve 5xSTS to less than 20 seconds and improved PFIQ by 25% in order to demonstrate improved QOL and functional ability.  Baseline: 28 seconds Goal status: INITIAL   5.  Patient will report no greater than 5/10 pain with sitting in order to more easily attend appointments, drive, and spend time with family.  Baseline:  Goal status: INITIAL   6.  Pt will demonstrate increase in all impaired hip strength by 1 muscle grades in order to demonstrate improved lumbopelvic support and increase functional ability.      Baseline:  Goal status: INITIAL   PLAN:   PT FREQUENCY: 1x/week   PT DURATION: 8 weeks   PLANNED INTERVENTIONS: Therapeutic exercises, Therapeutic activity, Neuromuscular re-education, Balance training, Gait training, Patient/Family education, Self Care, Joint mobilization, Aquatic Therapy, Dry Needling, Biofeedback, and Manual therapy   PLAN FOR NEXT SESSION: Manual techniques to help address pain and scar tissue restriction; gentle mobility and strengthening activities.    Heather Roberts, PT, DPT11/14/232:49 PM

## 2022-03-11 ENCOUNTER — Ambulatory Visit: Payer: PPO

## 2022-03-11 ENCOUNTER — Telehealth: Payer: Self-pay | Admitting: *Deleted

## 2022-03-11 ENCOUNTER — Encounter: Payer: Self-pay | Admitting: *Deleted

## 2022-03-11 DIAGNOSIS — R293 Abnormal posture: Secondary | ICD-10-CM

## 2022-03-11 DIAGNOSIS — M62838 Other muscle spasm: Secondary | ICD-10-CM

## 2022-03-11 DIAGNOSIS — M6281 Muscle weakness (generalized): Secondary | ICD-10-CM

## 2022-03-11 DIAGNOSIS — R279 Unspecified lack of coordination: Secondary | ICD-10-CM

## 2022-03-11 DIAGNOSIS — R3915 Urgency of urination: Secondary | ICD-10-CM | POA: Diagnosis not present

## 2022-03-11 NOTE — Patient Outreach (Signed)
  Care Coordination   03/11/2022 Name: Katelyn [name] MRN: [id number] DOB: Dec 04, 1975   Care Coordination Outreach Attempts:  A second unsuccessful outreach was attempted today to offer the patient with information about available care coordination services as a benefit of their health plan.     Follow Up Plan:  Additional outreach attempts will be made to offer the patient care coordination information and services.   Encounter Outcome:  No Answer  Care Coordination Interventions Activated:  No   Care Coordination Interventions:  No, not indicated    SIG Roosevelt Eimers C. Myrtie Neither, MSN, [facility] Gerontological Nurse Practitioner [facility] Care Management [phone]

## 2022-03-13 ENCOUNTER — Ambulatory Visit (HOSPITAL_BASED_OUTPATIENT_CLINIC_OR_DEPARTMENT_OTHER): Payer: PPO | Attending: Orthopaedic Surgery | Admitting: Physical Therapy

## 2022-03-13 DIAGNOSIS — R279 Unspecified lack of coordination: Secondary | ICD-10-CM | POA: Diagnosis not present

## 2022-03-13 DIAGNOSIS — M62838 Other muscle spasm: Secondary | ICD-10-CM | POA: Diagnosis not present

## 2022-03-13 DIAGNOSIS — R293 Abnormal posture: Secondary | ICD-10-CM | POA: Insufficient documentation

## 2022-03-13 DIAGNOSIS — M6281 Muscle weakness (generalized): Secondary | ICD-10-CM | POA: Insufficient documentation

## 2022-03-13 MED FILL — Iron Sucrose Inj 20 MG/ML (Fe Equiv): INTRAVENOUS | Qty: 10 | Status: AC

## 2022-03-13 NOTE — Therapy (Signed)
OUTPATIENT PHYSICAL THERAPY LOWER EXTREMITY TREATMENT   Patient Name: Katelyn [name] MRN: [id number] DOB:14-Jan-1976, 46 y.o., female Today's Date: 03/13/2022   PT End of Session - 03/13/22 0945     Visit Number 8    Number of Visits 15    Date for PT Re-Evaluation 04/26/22    Authorization Type HTA    PT Start Time 0845    PT Stop Time 0928    PT Time Calculation (min) 43 min    Activity Tolerance Patient tolerated treatment well    Behavior During Therapy [facility] for tasks assessed/performed                 Past Medical History:  Diagnosis Date   Anemia    Anorexia    Anxiety    Asthma    controlled with meds   Benign juvenile melanoma    Chronic headaches    Colon polyps    found on colonoscopy 62/70/3500   Complication of anesthesia    itching after epidural for c section   Constipation    Depression    Depression    several suicide attempts, hospitaluzed in 2012 for this; hx pf ECT treatments ; pt sees Dr. Adele Schilder pyschiatrist  and doing well on medication   Dysrhythmia    hx of PVC's (pt hasn't seen cardiologist since 2021- no follow up needed)   GERD (gastroesophageal reflux disease)    Headache    Heart murmur    as a child - no one mentions hearing murmur anymore, sometimes it is heard. Echo has been performed 02/03/18   Heartburn    no meds   History of pneumonia    x 3  years ago - no recent problems   Hx of blood clots    Left renal atrophy 12/19/2021   Neuromuscular disorder (HCC)    small fiber neuropathy   Obesity    Pneumonia    Polycystic ovary    takes metformin to treat   Renal atrophy, left 2023   Skin lesion    Excisional biopsy of moles - none cancerous   Sleep apnea    yrs ago - diagnosed mild sleep apnea - did not have to use cpap    Past Surgical History:  Procedure Laterality Date   BREAST CAPSULECTOMY Bilateral 12/13/2020   1 week later pt had hematoma surgery on left breast   BREAST ENHANCEMENT SURGERY Bilateral  02/11/2017   BREATH TEK H PYLORI N/A 07/22/2013   Procedure: BREATH TEK H PYLORI;  Surgeon: Edward Jolly, MD;  Location: [facility];  Service: General;  Laterality: N/A;   CESAREAN SECTION  2004, 2007   x 2   COLONOSCOPY  2013   CYSTOSCOPY WITH URETHRAL DILATATION  age 74    GASTRIC ROUX-EN-Y N/A 10/31/2013   Procedure: LAPAROSCOPIC ROUX-EN-Y GASTRIC BYPASS WITH UPPER ENDOSCOPY;  Surgeon: Edward Jolly, MD;  Location: [facility];  Service: General;  Laterality: N/A;   GLUTEUS MINIMUS REPAIR Left 05/28/2021   Procedure: Left [place];  Surgeon: Vanetta Mulders, MD;  Location: [place];  Service: Orthopedics;  Laterality: Left;   ingrown toenail Right    Ingrown nail on great right toe   kidney stent  08/28/2014   mole excision     "benign juvenile melanoma" removed from left leg - inner thigh   POLYPECTOMY     2013   SKIN LESION EXCISION     back   TONSILLECTOMY  age 78  TUBAL LIGATION  2018   Patient Active Problem List   Diagnosis Date Noted   Left renal atrophy 12/19/2021   Stage 2 chronic kidney disease 11/20/2021   Meningitis 09/27/2021   Epigastric pain 09/27/2021   GERD (gastroesophageal reflux disease) 09/27/2021   Rash and nonspecific skin eruption 09/27/2021   Hypocalcemia 09/27/2021   Chronic pain syndrome 09/27/2021   Sepsis ([place])    Fever 09/25/2021   Tear of left gluteus medius tendon    Asthma 07/18/2020   Vitamin B 12 deficiency 10/16/2019   Elevated LFTs 10/16/2019   Asthma, exogenous, unspecified asthma severity, uncomplicated 59/74/1638   B12 deficiency anemia 06/21/2018   Venous insufficiency 04/23/2018   Onychomycosis of toenail 04/17/2018   Chronic myofascial pain 03/18/2018   Small fiber neuropathy 03/18/2018   Idiopathic small fiber sensory neuropathy 02/23/2018   Varicose veins of both lower extremities 01/13/2018   Cervical spondylosis without myelopathy 08/28/2017   Migraine with aura and without status migrainosus, not  intractable 07/22/2017   Lumbar degenerative disc disease 07/20/2017   Spondylosis of lumbar spine 07/20/2017   Coccydynia 03/12/2017   Bariatric surgery status 05/02/2016   Major depressive disorder, recurrent episode, moderate ([place]) 02/18/2016   Tachy-brady syndrome ([place]) 12/19/2015   Iron deficiency anemia 06/10/2015   Sleep disorder 06/01/2013   Generalized anxiety disorder 05/30/2013   Chronic headaches 07/01/2012   Headache 07/01/2012   Depression, major, recurrent, severe with psychosis ([place]) 06/17/2012   Severe recurrent major depressive disorder with psychotic symptoms ([place]) 06/17/2012   Family history of colonic polyps 03/03/2012   Eating disorder 12/18/2011   Nausea & vomiting 01/22/2010   Polycystic ovarian syndrome 09/01/2008   Intermittent left-sided chest pain 08/02/2008    REFERRING PROVIDER: Vanetta Mulders, MD  REFERRING DIAG:  M25.511,G89.29,M25.512 (ICD-10-CM) - Chronic pain of both shoulders  S76.012A (ICD-10-CM) - Tear of left gluteus medius tendon, initial encounter  B/L shoulder program ROM and strenghening  L hip strengthening program  THERAPY DIAG:  Abnormal posture  Unspecified lack of coordination  Other muscle spasm  Muscle weakness (generalized)  Rationale for Evaluation and Treatment Rehabilitation  ONSET DATE: most recently June 2023  SUBJECTIVE:   SUBJECTIVE STATEMENT: Pt states that she continues to have significant pain in her R biceps. She is scheduled for an MRI, but is unsure of the date.  Pt also inquiring about scar tissue from breast surgery that may be contributing to her shoulder pain.  PERTINENT HISTORY: Sensory neuropathy, chronic pain, MDD, DDD  PAIN:  Are you having pain? Yes: NPRS scale: 7-shoulder, 3-hip/10 Pain location: bil shoulder, Lt hip Pain description: see subj Aggravating factors: see subj Relieving factors: see subj  PRECAUTIONS: Other: sensory neropathy Does not tolerate seated or supine positioning  well.   WEIGHT BEARING RESTRICTIONS No  FALLS:  Has patient fallen in last 6 months? No  LIVING ENVIRONMENT: Lives with: lives with their family  OCCUPATION: not working  PLOF: Independent with basic ADLs and Independent with community mobility without device  PATIENT GOALS decr pain, incr ability and stamina, out of the bed more, up steps, dressing    OBJECTIVE:   DIAGNOSTIC FINDINGS: bil shoulder xrays unremarkable on 12/06/21  PATIENT SURVEYS:  FOTO Hip 40, Shoulder 45  COGNITION:  Overall cognitive status: Within functional limits for tasks assessed     SENSATION: Lt UE neuropathy-painful to touch, light sensation in Rt UE LLE neuropathy, both feet have a little bit  POSTURE: weight off of LLE  PALPATION: TTP at [facility]  hip surgical site  LOWER EXTREMITY ROM:  Active ROM- in standing Right eval Left eval  Hip flexion 78 74  Hip extension 14 20  Hip abduction 20 18   (Blank rows = not tested)  LOWER EXTREMITY MMT:  MMT Right eval Left eval  Hip flexion    Hip extension    Hip abduction    Hip adduction    Hip internal rotation    Hip external rotation    Knee flexion    Knee extension    Ankle dorsiflexion    Ankle plantarflexion    Ankle inversion    Ankle eversion     (Blank rows = not tested)   FUNCTIONAL TESTS:  6MWT 1033 ft    RPE: 8/10; pain up to 8/10 on both sides (Lt more than Rt)  GAIT: Distance walked: Rt trendelenburg- pt verbalizes sensation of instability Assistive device utilized: none at eval  AROM Right EVAL  Left EVAL  Shoulder flexion 110 112  Shoulder extension 26 22  Shoulder abduction 108 100  Shoulder adduction    Shoulder internal rotation Below Sacrum mid buttock Just past neutral not behind body  Shoulder external rotation t2 c5  (Blank rows = not tested)  UEROM: Unable to hook bra behind back Difficulty shaving under arms, brushing hair, crossing arms hurts, unable to reach over either shoulder. *pt  verbalized numbness in feet with end range overpressure in standing shoulder flexion   TODAY'S TREATMENT: Treatment                            03/13/2022:  STM to scar tissue under R breast per pt request. STM to supraspinatus, biceps and UT ([place]) due to significant tension.  Chin tucks in supine.  GHJ mobs in posterior and inferior direction with PROM into flexion, Abduction to pt tolerance.    Treatment                            10/30:  [place] AAROM with SPC Traction stretch for shoulders using stretch strap from door Forward pulleys in wide tandem Open book at wall Alt shoulder flexion +bil, 1lb each hand in mirror Discussed exercise repetition and awareness    Treatment                            02/20/22:  MANUAL: STM periscap, upper traps; passive scap mobilization; thoracic & rib mobs Prone scap retraction Prone glute squeezes Mini wall squats with physioball Open books Standing W with breathing for rib cage expansion with physioball Standing wall slides into scaphion  Plank/pull off for hip hinge at silver bar  Treatment                            02/07/22:  MANUAL: STM periscap, upper traps; passive scap mobilization; thoracic & rib mobs Prone scap retraction Standing W with breathing for rib cage expansion Standing pulleys into flexion Mini wall squat Plank/pull off for hip hinge at silver bar   PATIENT EDUCATION:  Education details: Anatomy of condition, POC, HEP, exercise form/rationale Person educated: Patient Education method: Explanation, Demonstration, Tactile cues, Verbal cues, and Handouts Education comprehension: verbalized understanding, returned demonstration, verbal cues required, tactile cues required, and needs further education   HOME EXERCISE PROGRAM: [id number]  ASSESSMENT:  CLINICAL IMPRESSION: Pt demonstrates  poor posture with elevated/ rounded shoulders and a forward head. She states that she has a hard time relaxing due to the  pain, despite multiple cues throughout session to relax. Assessed scar tissue under right breast per request. Pt has mild scar tissue in 2 places along scar, but nothing significant enough to affect shoulder pain. GHJ mobs and STM to R shoulder with improved tolerance today. Pt is improving very slowly at this time. She is scheduled for an MRI, but is unaware of the date at this time. Pt will continue to benefit from skilled PT to address continued deficits.    OBJECTIVE IMPAIRMENTS decreased activity tolerance, difficulty walking, decreased ROM, decreased strength, increased muscle spasms, impaired flexibility, impaired sensation, improper body mechanics, postural dysfunction, and pain.   ACTIVITY LIMITATIONS carrying, lifting, bending, sitting, standing, squatting, sleeping, stairs, transfers, bed mobility, bathing, toileting, dressing, reach over head, hygiene/grooming, locomotion level, and caring for others  PARTICIPATION LIMITATIONS: meal prep, cleaning, laundry, driving, shopping, and community activity  PERSONAL FACTORS 3+ comorbidities: see PMH  are also affecting patient's functional outcome.     GOALS: Goals reviewed with patient? Yes  SHORT TERM GOALS: Target date: 02/21/2022  GHJ IR to midline bilaterally Baseline: Goal status: achieved when done passively, pt reported Lt AC joint pain  2.  Pt will verbalize awareness of equal WB and more frequent corrections Baseline:  Goal status: achieved  3.  Independent in HEP as it has been established in the short term Baseline:  Goal status: achieved   LONG TERM GOALS: Target date: 03/28/22  Pt will meet FOTO goals for hip and shoulder Baseline:  Goal status: INITIAL  2.  6 MWT to distance greater than 50-59 age normative values Baseline:  Goal status: INITIAL  3.  Able to fix hair independently pain <=3/10 Baseline:  Goal status: INITIAL  4.  Able to dress with minimal difficulty for shoulders and hip Baseline:  Goal  status: INITIAL  5.  Able to navigate stairs with reciprocal pattern Baseline:  Goal status: INITIAL  6.  Able to tolerate ADLs without requiring long periods in her bed due to pain Baseline:  Goal status: INITIAL   PLAN: PT FREQUENCY: 1-2x/week  PT DURATION: 12 weeks  PLANNED INTERVENTIONS: Therapeutic exercises, Therapeutic activity, Neuromuscular re-education, Balance training, Gait training, Patient/Family education, Self Care, Joint mobilization, Stair training, Aquatic Therapy, Dry Needling, Electrical stimulation, Spinal mobilization, Cryotherapy, Moist heat, Taping, Manual therapy, and Re-evaluation  PLAN FOR NEXT SESSION: continue prone manual work  [employer] C. Hightower PT, DPT 03/13/22 9:51 AM

## 2022-03-14 ENCOUNTER — Inpatient Hospital Stay: Payer: PPO

## 2022-03-14 VITALS — BP 151/54 | HR 77 | Temp 98.2°F | Resp 16

## 2022-03-14 DIAGNOSIS — D508 Other iron deficiency anemias: Secondary | ICD-10-CM

## 2022-03-14 MED ORDER — SODIUM CHLORIDE 0.9 % IV SOLN
200.0000 mg | Freq: Once | INTRAVENOUS | Status: AC
  Administered 2022-03-14: 200 mg via INTRAVENOUS
  Filled 2022-03-14: qty 200

## 2022-03-14 MED ORDER — SODIUM CHLORIDE 0.9 % IV SOLN: Freq: Once | INTRAVENOUS | Status: AC

## 2022-03-14 NOTE — Progress Notes (Addendum)
Pt presents today for Venofer IV iron per provider's order. Vital signs stable and pt voiced no new complaints at this time.   Peripheral IV started with good blood return. Pt took pre-meds Tylenol, Claritin, and Pepcid at home prior to arrival. Pt refused taking steroids.   Venofer 200 mg given today per MD orders. Tolerated infusion without adverse affects. Vital signs stable. No complaints at this time. Discharged from clinic ambulatory in stable condition. Alert and oriented x 3. F/U with [facility] as scheduled.  Pt refused to wait 30 minute wait time per policy. Pt educated on the importance of waiting 30 minute wait time, but pt still refused to wait.

## 2022-03-14 NOTE — Patient Instructions (Signed)
[facility]   Discharge Instructions: Thank you for choosing [facility] to provide your oncology and hematology care.  If you have a lab appointment with the Cancer Center, please come in thru the Main Entrance and check in at the main information desk.  Wear comfortable clothing and clothing appropriate for easy access to any Portacath or PICC line.   We strive to give you quality time with your provider. You may need to reschedule your appointment if you arrive late (15 or more minutes).  Arriving late affects you and other patients whose appointments are after yours.  Also, if you miss three or more appointments without notifying the office, you may be dismissed from the clinic at the provider's discretion.      For prescription refill requests, have your pharmacy contact our office and allow 72 hours for refills to be completed.    Today you received Venofer IV iron.   BELOW ARE SYMPTOMS THAT SHOULD BE REPORTED IMMEDIATELY: *FEVER GREATER THAN 100.4 F (38 C) OR HIGHER *CHILLS OR SWEATING *NAUSEA AND VOMITING THAT IS NOT CONTROLLED WITH YOUR NAUSEA MEDICATION *UNUSUAL SHORTNESS OF BREATH *UNUSUAL BRUISING OR BLEEDING *URINARY PROBLEMS (pain or burning when urinating, or frequent urination) *BOWEL PROBLEMS (unusual diarrhea, constipation, pain near the anus) TENDERNESS IN MOUTH AND THROAT WITH OR WITHOUT PRESENCE OF ULCERS (sore throat, sores in mouth, or a toothache) UNUSUAL RASH, SWELLING OR PAIN  UNUSUAL VAGINAL DISCHARGE OR ITCHING   Items with * indicate a potential emergency and should be followed up as soon as possible or go to the Emergency Department if any problems should occur.  Please show the CHEMOTHERAPY ALERT CARD or IMMUNOTHERAPY ALERT CARD at check-in to the Emergency Department and triage nurse.  Should you have questions after your visit or need to cancel or reschedule your appointment, please contact [facility] [phone]  and follow the prompts.  Office hours are 8:00 a.m. to 4:30 p.m. Monday - Friday. Please note that voicemails left after 4:00 p.m. may not be returned until the following business day.  We are closed weekends and major holidays. You have access to a nurse at all times for urgent questions. Please call the main number to the clinic [phone] and follow the prompts.  For any non-urgent questions, you may also contact your provider using MyChart. We now offer e-Visits for anyone 18 and older to request care online for non-urgent symptoms. For details visit [email or web address].   Also download the MyChart app! Go to the app store, search "MyChart", open the app, select [facility], and log in with your MyChart username and password.  Masks are optional in the cancer centers. If you would like for your care team to wear a mask while they are taking care of you, please let them know. You may have one support person who is at least 46 years old accompany you for your appointments.  

## 2022-03-17 ENCOUNTER — Ambulatory Visit: Payer: PPO

## 2022-03-17 DIAGNOSIS — M62838 Other muscle spasm: Secondary | ICD-10-CM

## 2022-03-17 DIAGNOSIS — R3915 Urgency of urination: Secondary | ICD-10-CM | POA: Diagnosis not present

## 2022-03-17 DIAGNOSIS — R293 Abnormal posture: Secondary | ICD-10-CM

## 2022-03-17 DIAGNOSIS — R279 Unspecified lack of coordination: Secondary | ICD-10-CM

## 2022-03-17 DIAGNOSIS — M6281 Muscle weakness (generalized): Secondary | ICD-10-CM

## 2022-03-17 NOTE — Therapy (Signed)
OUTPATIENT PHYSICAL THERAPY TREATMENT NOTE   Patient Name: Katelyn [name] MRN: [id number] DOB:April 30, 1975, 46 y.o., female Today's Date: 03/17/2022  PCP: Kathyrn Drown, MD  REFERRING PROVIDER: Vira Agar, MD  END OF SESSION:   PT End of Session - 03/17/22 1144     Visit Number 9   3rd PF   Date for PT Re-Evaluation 04/26/22    Authorization Type HTA    PT Start Time 1145    PT Stop Time 1225    PT Time Calculation (min) 40 min    Activity Tolerance Patient tolerated treatment well    Behavior During Therapy [facility] for tasks assessed/performed             Past Medical History:  Diagnosis Date   Anemia    Anorexia    Anxiety    Asthma    controlled with meds   Benign juvenile melanoma    Chronic headaches    Colon polyps    found on colonoscopy 12/09/4816   Complication of anesthesia    itching after epidural for c section   Constipation    Depression    Depression    several suicide attempts, hospitaluzed in 2012 for this; hx pf ECT treatments ; pt sees Dr. Adele Schilder pyschiatrist  and doing well on medication   Dysrhythmia    hx of PVC's (pt hasn't seen cardiologist since 2021- no follow up needed)   GERD (gastroesophageal reflux disease)    Headache    Heart murmur    as a child - no one mentions hearing murmur anymore, sometimes it is heard. Echo has been performed 02/03/18   Heartburn    no meds   History of pneumonia    x 3  years ago - no recent problems   Hx of blood clots    Left renal atrophy 12/19/2021   Neuromuscular disorder (HCC)    small fiber neuropathy   Obesity    Pneumonia    Polycystic ovary    takes metformin to treat   Renal atrophy, left 2023   Skin lesion    Excisional biopsy of moles - none cancerous   Sleep apnea    yrs ago - diagnosed mild sleep apnea - did not have to use cpap    Past Surgical History:  Procedure Laterality Date   BREAST CAPSULECTOMY Bilateral 12/13/2020   1 week later pt had hematoma surgery on left  breast   BREAST ENHANCEMENT SURGERY Bilateral 02/11/2017   BREATH TEK H PYLORI N/A 07/22/2013   Procedure: BREATH TEK H PYLORI;  Surgeon: Edward Jolly, MD;  Location: [facility];  Service: General;  Laterality: N/A;   CESAREAN SECTION  2004, 2007   x 2   COLONOSCOPY  2013   CYSTOSCOPY WITH URETHRAL DILATATION  age 35    GASTRIC ROUX-EN-Y N/A 10/31/2013   Procedure: LAPAROSCOPIC ROUX-EN-Y GASTRIC BYPASS WITH UPPER ENDOSCOPY;  Surgeon: Edward Jolly, MD;  Location: [facility];  Service: General;  Laterality: N/A;   GLUTEUS MINIMUS REPAIR Left 05/28/2021   Procedure: Left [place];  Surgeon: Vanetta Mulders, MD;  Location: [facility];  Service: Orthopedics;  Laterality: Left;   ingrown toenail Right    Ingrown nail on great right toe   kidney stent  08/28/2014   mole excision     "benign juvenile melanoma" removed from left leg - inner thigh   POLYPECTOMY     2013   SKIN LESION EXCISION  back   TONSILLECTOMY  age 53   TUBAL LIGATION  2018   Patient Active Problem List   Diagnosis Date Noted   Left renal atrophy 12/19/2021   Stage 2 chronic kidney disease 11/20/2021   Meningitis 09/27/2021   Epigastric pain 09/27/2021   GERD (gastroesophageal reflux disease) 09/27/2021   Rash and nonspecific skin eruption 09/27/2021   Hypocalcemia 09/27/2021   Chronic pain syndrome 09/27/2021   Sepsis ([place])    Fever 09/25/2021   Tear of left gluteus medius tendon    Asthma 07/18/2020   Vitamin B 12 deficiency 10/16/2019   Elevated LFTs 10/16/2019   Asthma, exogenous, unspecified asthma severity, uncomplicated 42/68/3419   B12 deficiency anemia 06/21/2018   Venous insufficiency 04/23/2018   Onychomycosis of toenail 04/17/2018   Chronic myofascial pain 03/18/2018   Small fiber neuropathy 03/18/2018   Idiopathic small fiber sensory neuropathy 02/23/2018   Varicose veins of both lower extremities 01/13/2018   Cervical spondylosis without myelopathy 08/28/2017   Migraine  with aura and without status migrainosus, not intractable 07/22/2017   Lumbar degenerative disc disease 07/20/2017   Spondylosis of lumbar spine 07/20/2017   Coccydynia 03/12/2017   Bariatric surgery status 05/02/2016   Major depressive disorder, recurrent episode, moderate ([place]) 02/18/2016   Tachy-brady syndrome ([place]) 12/19/2015   Iron deficiency anemia 06/10/2015   Sleep disorder 06/01/2013   Generalized anxiety disorder 05/30/2013   Chronic headaches 07/01/2012   Headache 07/01/2012   Depression, major, recurrent, severe with psychosis ([place]) 06/17/2012   Severe recurrent major depressive disorder with psychotic symptoms ([place]) 06/17/2012   Family history of colonic polyps 03/03/2012   Eating disorder 12/18/2011   Nausea & vomiting 01/22/2010   Polycystic ovarian syndrome 09/01/2008   Intermittent left-sided chest pain 08/02/2008    REFERRING DIAG: M25.511,G89.29,M25.512 (ICD-10-CM) - Chronic pain of both shoulders S76.012A (ICD-10-CM) - Tear of left gluteus medius tendon, initial encounter  THERAPY DIAG:  Abnormal posture  Muscle weakness (generalized)  Other muscle spasm  Unspecified lack of coordination  Rationale for Evaluation and Treatment Rehabilitation  PERTINENT HISTORY: Breast augmentation surgeries with follow-up reduction with complication, c/s, gastric bypass, bil tubal ligation, long history of chronic low back/pelvic pain, pudendal neuralgia, coccydynia, Lt glute med repair  PRECAUTIONS: NA  SUBJECTIVE:                                                                                                                                                                                      SUBJECTIVE STATEMENT:  Pt states that she is still very sore from scar tissue massage in orthopedic PT. She reports improvement in low back pain after last treatment session. She is tolerating  exercises, but states that that are uncomfortable - especially the hip circles.    PAIN:   Are you having pain? Yes, 7/10, Lt hip and low back   03/03/22 SUBJECTIVE STATEMENT: Pt had surgery for Lt glute med tear that most likely happened during fall 3 years ago. She has still had pain in Lt hip, but she is in rehab due to it. She had meningitis in June and ended up having Bil frozen shoulders. Her biggest complaint is of back pain, which impacts her bil shoulder pain because she has to lay on either side. She mostly lies on Rt side, which causes pain in Rt low back and Rt hip. She is rarely getting pressure off of buttocks. She continues to have sacral/coccydynia with sitting and avoids this position. She is not currently having vaginal/pelvic pain, but states this is coming and going - she manages by decreasing frequency of intercourse - no 1x/week. She is having issue with Lt kidney, but MD states that urinary issues are not related.  Is currently in PT for hip and shoulders - primary working on shoulders. Is only doing some basic leg strengthening and shoulder exercises at home.  Fluid intake: Yes: -     PAIN:  Are you having pain? Yes NPRS scale: 8/10Right and Left Pain location:    Pain type: sharp and pressure Pain description: constant    Aggravating factors: sitting, driving, finding comfortable position Relieving factors: lying on stomach relieves buttock pain if she can, but shoulders prevent this   PRECAUTIONS: None   WEIGHT BEARING RESTRICTIONS: No   FALLS:  Has patient fallen in last 6 months? No   LIVING ENVIRONMENT: Lives with: lives with their family Lives in: House/apartment   OCCUPATION: disabled   PLOF: Independent   PATIENT GOALS: would like to be able to sit; decrease bil hip/low back pain   PERTINENT HISTORY:  Breast augmentation surgeries with follow-up reduction with complication, c/s, gastric bypass, bil tubal ligation, long history of chronic low back/pelvic pain, pudendal neuralgia, coccydynia, Lt glute med repair Sexual abuse: No   BOWEL  MOVEMENT: Pain with bowel movement: No Type of bowel movement:Frequency 1x/day and Strain Yes Fully empty rectum: Yes: - Leakage: No Pads: No Fiber supplement: No no medication to help stimulate bowel movements   URINATION: Pain with urination: No Fully empty bladder: No Stream:  trouble starting the stream Urgency: no Frequency: every 30 minutes to an hour - worse once she does go, then she has to keep going - easier to just hold it Leakage:  none Pads: No   INTERCOURSE: Pain with intercourse: Initial Penetration and During Penetration Ability to have vaginal penetration:  Yes: pain - only tolerates 1x/week Climax: yes Marinoff Scale: 2/3   PREGNANCY: Vaginal deliveries 0 Tearing No C-section deliveries 2 Currently pregnant No       OBJECTIVE:  03/03/22: DIAGNOSTIC FINDINGS:  Lasix renogram with no abnormal findings   PATIENT SURVEYS:    PFIQ-7 100%   COGNITION: Overall cognitive status: Within functional limits for tasks assessed                          SENSATION: Light touch: Appears intact Proprioception: Appears intact   MUSCLE LENGTH:     FUNCTIONAL TESTS:              5xSTS: 29 seconds   GAIT:   Comments: decreased bil hip extension, posterior pelvic tilt   POSTURE: rounded shoulders,  forward head, increased lumbar lordosis, and posterior pelvic tilt   PELVIC ALIGNMENT:   LUMBARAROM/PROM:   A/PROM A/PROM  Eval (%)  Flexion 50, pain on Rt  Extension 20, no pain, very limited mobility  Right lateral flexion 50, pain in Rt low back  Left lateral flexion 75, pain in Rt low back  Right rotation 50, good stretch  Left rotation 50, good stretch   (Blank rows = not tested)     LOWER EXTREMITY MMT: tested in standing due to comfort             Rt: 3/5 hip abduction and extension, 4/5 adduction and flexion             Lt: 3/5 hip abduction, extension, flexion, 4/5 adduction     PALPATION:   General  tenderness throughout bil lumbar  paraspinals and gluts, Lt>Rt; tenderness and spasm in thoracic paraspinals with decreased mobility upon deep breathing; scar tissue restriction under bil breasts that is tender; scar tissue restriction Lt glut med surgical incision                 External Perineal Exam NA                             Internal Pelvic Floor NA   Patient confirms identification and approves PT to assess internal pelvic floor and treatment Yes for future need   PELVIC MMT:   MMT eval  Vaginal    Internal Anal Sphincter    External Anal Sphincter    Puborectalis    Diastasis Recti    (Blank rows = not tested)         TONE: NA   PROLAPSE: NA   TODAY'S TREATMENT 03/17/22: Manual: Soft tissue mobilization: Lt hip, bil lumbar paraspinals Scar tissue mobilization: Lt hip incision - negative pressure soft tissue mobilization  Lt breast incision Neuromuscular re-education: Form correction: Standing march with hold 12x5 sec hold Standing pelvic tilts with breath coordination Exercises: Stretches/mobility: Standing hip circles 10x Standing hip lateral glides with core activation and breath coordination Standing lateral trunk flexion without UE   TREATMENT 03/11/22: Manual: Soft tissue mobilization: Lt hip, bil lumbar paraspinals Scar tissue mobilization: Lt hip incision Lt breast incision Neuromuscular re-education: Core retraining:  Transversus abdominus training with multimodal cues for improved motor control and breath coordination Core facilitation: Pelvic tilts with breath coordination and abdominal activation with exhale Standing march 2 x 10 Exercises: Stretches/mobility: Standing hip circles bil, 2 x 10 Self-care: Self-scar tissue massage                                                                                                                              DATE: 03/03/22  EVAL  Neuromuscular re-education: Down training: Diaphragmatic breathing for anxiety/pain  management and improved abdominal/thoracic mobility Self-care: Self-scar tissue mobilization for bil breasts and Lt hip - light touch to tolerance,  5 min a day       PATIENT EDUCATION:  Education details: see above self-care Person educated: Patient Education method: Explanation, Demonstration, Tactile cues, Verbal cues, and Handouts Education comprehension: verbalized understanding   HOME EXERCISE PROGRAM: Written handout   ASSESSMENT:   CLINICAL IMPRESSION: Exercise modifications to hip circles due to discomfort; lateral hip glides performed instead in order to perform with breath and better core activation. She reported improvement in comfort. She did well with all other exercises. Good tolerance to manual which was largely kept the same due to good improvement in pain; we did add negative pressure instrument assisted soft tissue mobilization with good tolerance. She was concerned about lump under Lt breast; from a physical therapy perspective, only palpation of scar tissue found, but she was encouraged to talk with MD about concerns. She will continue to benefit from skilled PT intervention in order to decrease pain, improve functional ability, increase ease of starting stream/emptying bladder, and improve QOL.    OBJECTIVE IMPAIRMENTS: decreased activity tolerance, decreased coordination, decreased endurance, decreased mobility, decreased ROM, decreased strength, hypomobility, increased fascial restrictions, increased muscle spasms, impaired flexibility, impaired tone, postural dysfunction, and pain.    ACTIVITY LIMITATIONS: bending, sitting, standing, stairs, transfers, bed mobility, and locomotion level   PARTICIPATION LIMITATIONS: cleaning, laundry, interpersonal relationship, driving, and community activity   PERSONAL FACTORS: 3+ comorbidities: Breast augmentation surgeries with follow-up reduction with complication, c/s, gastric bypass, bil tubal ligation, long history of chronic  low back/pelvic pain, pudendal neuralgia, coccydynia, Lt glut med repair  are also affecting patient's functional outcome.    REHAB POTENTIAL: Good   CLINICAL DECISION MAKING: Stable/uncomplicated   EVALUATION COMPLEXITY: Low     GOALS: Goals reviewed with patient? Yes   SHORT TERM GOALS: Target date: 03/31/2022   Pt will be independent with HEP.    Baseline: Goal status: INITIAL   2.  Pt will be independent with diaphragmatic breathing and down training activities in order to improve pelvic floor relaxation and abdominal wall mobility.   Baseline:  Goal status: INITIAL   3.  Pt will report improve stream initiation and complete voiding with urination in order to improve QOL. Baseline:  Goal status: INITIAL              4.  Pt will increase all impaired lumbar A/ROM by 25% without pain.   Baseline:  Goal status: INITIAL   LONG TERM GOALS: Target date: 04/28/2022    Pt will be independent with advanced HEP.    Baseline:  Goal status: INITIAL   2.  Pt will be able to use regular toilet without difficulty. Baseline: Using high toilet chair Goal status: INITIAL   3.  Pt will be able to ascend/descend 12 steps at regular intervals throughout the day safely and with minimal difficulty.  Baseline:  Goal status: INITIAL   4.  Pt will improve 5xSTS to less than 20 seconds and improved PFIQ by 25% in order to demonstrate improved QOL and functional ability.  Baseline: 28 seconds Goal status: INITIAL   5.  Patient will report no greater than 5/10 pain with sitting in order to more easily attend appointments, drive, and spend time with family.  Baseline:  Goal status: INITIAL   6.  Pt will demonstrate increase in all impaired hip strength by 1 muscle grades in order to demonstrate improved lumbopelvic support and increase functional ability.      Baseline:  Goal status: INITIAL   PLAN:   PT FREQUENCY:  1x/week   PT DURATION: 8 weeks   PLANNED INTERVENTIONS:  Therapeutic exercises, Therapeutic activity, Neuromuscular re-education, Balance training, Gait training, Patient/Family education, Self Care, Joint mobilization, Aquatic Therapy, Dry Needling, Biofeedback, and Manual therapy   PLAN FOR NEXT SESSION: Manual techniques to help address pain and scar tissue restriction; gentle mobility and strengthening activities.    Heather Roberts, PT, DPT11/20/2312:31 PM

## 2022-03-25 DIAGNOSIS — F331 Major depressive disorder, recurrent, moderate: Secondary | ICD-10-CM | POA: Diagnosis not present

## 2022-03-25 DIAGNOSIS — F411 Generalized anxiety disorder: Secondary | ICD-10-CM | POA: Diagnosis not present

## 2022-03-26 ENCOUNTER — Ambulatory Visit: Payer: PPO

## 2022-03-26 DIAGNOSIS — R279 Unspecified lack of coordination: Secondary | ICD-10-CM

## 2022-03-26 DIAGNOSIS — R293 Abnormal posture: Secondary | ICD-10-CM

## 2022-03-26 DIAGNOSIS — M62838 Other muscle spasm: Secondary | ICD-10-CM

## 2022-03-26 DIAGNOSIS — M25552 Pain in left hip: Secondary | ICD-10-CM

## 2022-03-26 DIAGNOSIS — M6281 Muscle weakness (generalized): Secondary | ICD-10-CM

## 2022-03-26 DIAGNOSIS — R3915 Urgency of urination: Secondary | ICD-10-CM | POA: Diagnosis not present

## 2022-03-26 NOTE — Therapy (Signed)
OUTPATIENT PHYSICAL THERAPY TREATMENT NOTE Progress Note  Reporting Period 03/03/22 to 03/26/22  See note below for Objective Data and Assessment of Progress/Goals.    Patient Name: Katelyn [name] MRN: [id number] DOB:[birth year], 46 y.o., female Today's Date: 03/26/2022  PCP: Kathyrn Drown, MD  REFERRING PROVIDER: Vira Agar, MD  END OF SESSION:   PT End of Session - 03/26/22 0800     Visit Number 10   PR done for 10th visit on pelvic health appointment   Date for PT Re-Evaluation 04/26/22    Authorization Type HTA    PT Start Time 0800    PT Stop Time 0840    PT Time Calculation (min) 40 min    Activity Tolerance Patient tolerated treatment well    Behavior During Therapy [facility] for tasks assessed/performed             Past Medical History:  Diagnosis Date   Anemia    Anorexia    Anxiety    Asthma    controlled with meds   Benign juvenile melanoma    Chronic headaches    Colon polyps    found on colonoscopy 01/77/9390   Complication of anesthesia    itching after epidural for c section   Constipation    Depression    Depression    several suicide attempts, hospitaluzed in 2012 for this; hx pf ECT treatments ; pt sees Dr. Adele Schilder pyschiatrist  and doing well on medication   Dysrhythmia    hx of PVC's (pt hasn't seen cardiologist since 2021- no follow up needed)   GERD (gastroesophageal reflux disease)    Headache    Heart murmur    as a child - no one mentions hearing murmur anymore, sometimes it is heard. Echo has been performed 02/03/18   Heartburn    no meds   History of pneumonia    x 3  years ago - no recent problems   Hx of blood clots    Left renal atrophy 12/19/2021   Neuromuscular disorder (HCC)    small fiber neuropathy   Obesity    Pneumonia    Polycystic ovary    takes metformin to treat   Renal atrophy, left 2023   Skin lesion    Excisional biopsy of moles - none cancerous   Sleep apnea    yrs ago - diagnosed mild sleep apnea -  did not have to use cpap    Past Surgical History:  Procedure Laterality Date   BREAST CAPSULECTOMY Bilateral 12/13/2020   1 week later pt had hematoma surgery on left breast   BREAST ENHANCEMENT SURGERY Bilateral 02/11/2017   BREATH TEK H PYLORI N/A 07/22/2013   Procedure: BREATH TEK H PYLORI;  Surgeon: Edward Jolly, MD;  Location: [facility];  Service: General;  Laterality: N/A;   CESAREAN SECTION  2004, 2007   x 2   COLONOSCOPY  2013   CYSTOSCOPY WITH URETHRAL DILATATION  age 32    GASTRIC ROUX-EN-Y N/A 10/31/2013   Procedure: LAPAROSCOPIC ROUX-EN-Y GASTRIC BYPASS WITH UPPER ENDOSCOPY;  Surgeon: Edward Jolly, MD;  Location: [facility];  Service: General;  Laterality: N/A;   GLUTEUS MINIMUS REPAIR Left 05/28/2021   Procedure: Left [place];  Surgeon: Vanetta Mulders, MD;  Location: [facility];  Service: Orthopedics;  Laterality: Left;   ingrown toenail Right    Ingrown nail on great right toe   kidney stent  08/28/2014   mole excision     "  benign juvenile melanoma" removed from left leg - inner thigh   POLYPECTOMY     2013   SKIN LESION EXCISION     back   TONSILLECTOMY  age 57   TUBAL LIGATION  2018   Patient Active Problem List   Diagnosis Date Noted   Left renal atrophy 12/19/2021   Stage 2 chronic kidney disease 11/20/2021   Meningitis 09/27/2021   Epigastric pain 09/27/2021   GERD (gastroesophageal reflux disease) 09/27/2021   Rash and nonspecific skin eruption 09/27/2021   Hypocalcemia 09/27/2021   Chronic pain syndrome 09/27/2021   Sepsis ([place])    Fever 09/25/2021   Tear of left gluteus medius tendon    Asthma 07/18/2020   Vitamin B 12 deficiency 10/16/2019   Elevated LFTs 10/16/2019   Asthma, exogenous, unspecified asthma severity, uncomplicated 44/46/1901   B12 deficiency anemia 06/21/2018   Venous insufficiency 04/23/2018   Onychomycosis of toenail 04/17/2018   Chronic myofascial pain 03/18/2018   Small fiber neuropathy 03/18/2018    Idiopathic small fiber sensory neuropathy 02/23/2018   Varicose veins of both lower extremities 01/13/2018   Cervical spondylosis without myelopathy 08/28/2017   Migraine with aura and without status migrainosus, not intractable 07/22/2017   Lumbar degenerative disc disease 07/20/2017   Spondylosis of lumbar spine 07/20/2017   Coccydynia 03/12/2017   Bariatric surgery status 05/02/2016   Major depressive disorder, recurrent episode, moderate ([place]) 02/18/2016   Tachy-brady syndrome ([place]) 12/19/2015   Iron deficiency anemia 06/10/2015   Sleep disorder 06/01/2013   Generalized anxiety disorder 05/30/2013   Chronic headaches 07/01/2012   Headache 07/01/2012   Depression, major, recurrent, severe with psychosis ([place]) 06/17/2012   Severe recurrent major depressive disorder with psychotic symptoms ([place]) 06/17/2012   Family history of colonic polyps 03/03/2012   Eating disorder 12/18/2011   Nausea & vomiting 01/22/2010   Polycystic ovarian syndrome 09/01/2008   Intermittent left-sided chest pain 08/02/2008    REFERRING DIAG: M25.511,G89.29,M25.512 (ICD-10-CM) - Chronic pain of both shoulders S76.012A (ICD-10-CM) - Tear of left gluteus medius tendon, initial encounter  THERAPY DIAG:  Abnormal posture  Muscle weakness (generalized)  Other muscle spasm  Unspecified lack of coordination  Pain in left hip  Rationale for Evaluation and Treatment Rehabilitation  PERTINENT HISTORY: Breast augmentation surgeries with follow-up reduction with complication, c/s, gastric bypass, bil tubal ligation, long history of chronic low back/pelvic pain, pudendal neuralgia, coccydynia, Lt glute med repair  PRECAUTIONS: NA  SUBJECTIVE:  SUBJECTIVE STATEMENT:  Pt states that she she did well after all manual therapy last  treatment session. She feels like she is starting to see overall improvements in pain levels over the course of the week. She believes that cupping was very helpful.    PAIN:  Are you having pain? Yes, 7/10, Lt hip and low back   03/03/22 SUBJECTIVE STATEMENT: Pt had surgery for Lt glute med tear that most likely happened during fall 3 years ago. She has still had pain in Lt hip, but she is in rehab due to it. She had meningitis in June and ended up having Bil frozen shoulders. Her biggest complaint is of back pain, which impacts her bil shoulder pain because she has to lay on either side. She mostly lies on Rt side, which causes pain in Rt low back and Rt hip. She is rarely getting pressure off of buttocks. She continues to have sacral/coccydynia with sitting and avoids this position. She is not currently having vaginal/pelvic pain, but states this is coming and going - she manages by decreasing frequency of intercourse - no 1x/week. She is having issue with Lt kidney, but MD states that urinary issues are not related.  Is currently in PT for hip and shoulders - primary working on shoulders. Is only doing some basic leg strengthening and shoulder exercises at home.  Fluid intake: Yes: -     PAIN:  Are you having pain? Yes NPRS scale: 8/10Right and Left Pain location:    Pain type: sharp and pressure Pain description: constant    Aggravating factors: sitting, driving, finding comfortable position Relieving factors: lying on stomach relieves buttock pain if she can, but shoulders prevent this   PRECAUTIONS: None   WEIGHT BEARING RESTRICTIONS: No   FALLS:  Has patient fallen in last 6 months? No   LIVING ENVIRONMENT: Lives with: lives with their family Lives in: House/apartment   OCCUPATION: disabled   PLOF: Independent   PATIENT GOALS: would like to be able to sit; decrease bil hip/low back pain   PERTINENT HISTORY:  Breast augmentation surgeries with follow-up reduction with  complication, c/s, gastric bypass, bil tubal ligation, long history of chronic low back/pelvic pain, pudendal neuralgia, coccydynia, Lt glute med repair Sexual abuse: No   BOWEL MOVEMENT: Pain with bowel movement: No Type of bowel movement:Frequency 1x/day and Strain Yes Fully empty rectum: Yes: - Leakage: No Pads: No Fiber supplement: No no medication to help stimulate bowel movements   URINATION: Pain with urination: No Fully empty bladder: No Stream:  trouble starting the stream Urgency: no Frequency: every 30 minutes to an hour - worse once she does go, then she has to keep going - easier to just hold it Leakage:  none Pads: No   INTERCOURSE: Pain with intercourse: Initial Penetration and During Penetration Ability to have vaginal penetration:  Yes: pain - only tolerates 1x/week Climax: yes Marinoff Scale: 2/3   PREGNANCY: Vaginal deliveries 0 Tearing No C-section deliveries 2 Currently pregnant No       OBJECTIVE:  03/03/22: DIAGNOSTIC FINDINGS:  Lasix renogram with no abnormal findings   PATIENT SURVEYS:    PFIQ-7 100%   COGNITION: Overall cognitive status: Within functional limits for tasks assessed                          SENSATION: Light touch: Appears intact Proprioception: Appears intact   MUSCLE LENGTH:     FUNCTIONAL TESTS:  5xSTS: 29 seconds   GAIT:   Comments: decreased bil hip extension, posterior pelvic tilt   POSTURE: rounded shoulders, forward head, increased lumbar lordosis, and posterior pelvic tilt   PELVIC ALIGNMENT:   LUMBARAROM/PROM:   A/PROM A/PROM  Eval (%)  Flexion 50, pain on Rt  Extension 20, no pain, very limited mobility  Right lateral flexion 50, pain in Rt low back  Left lateral flexion 75, pain in Rt low back  Right rotation 50, good stretch  Left rotation 50, good stretch   (Blank rows = not tested)     LOWER EXTREMITY MMT: tested in standing due to comfort             Rt: 3/5 hip abduction  and extension, 4/5 adduction and flexion             Lt: 3/5 hip abduction, extension, flexion, 4/5 adduction     PALPATION:   General  tenderness throughout bil lumbar paraspinals and gluts, Lt>Rt; tenderness and spasm in thoracic paraspinals with decreased mobility upon deep breathing; scar tissue restriction under bil breasts that is tender; scar tissue restriction Lt glut med surgical incision                 External Perineal Exam NA                             Internal Pelvic Floor NA   Patient confirms identification and approves PT to assess internal pelvic floor and treatment Yes for future need   PELVIC MMT:   MMT eval  Vaginal    Internal Anal Sphincter    External Anal Sphincter    Puborectalis    Diastasis Recti    (Blank rows = not tested)         TONE: NA   PROLAPSE: NA   TODAY'S TREATMENT 03/26/22: Manual: Soft tissue mobilization: Lt hip, bil lumbar paraspinals Scar tissue mobilization: Lt hip incision - negative pressure soft tissue mobilization  Lt breast incision Neuromuscular re-education: Core retraining:  Core facilitation: Form correction: Pelvic floor contraction training: Down training: Exercises: Stretches/mobility: Strengthening: Therapeutic activities: Functional strengthening activities: Self-care: We discussed HEP and decided to remain at current program due to lack of performance several days because of the holiday   TREATMENT 03/17/22: Manual: Soft tissue mobilization: Lt hip, bil lumbar paraspinals Scar tissue mobilization: Lt hip incision - negative pressure soft tissue mobilization  Lt breast incision Neuromuscular re-education: Form correction: Standing march with hold 12x5 sec hold Standing pelvic tilts with breath coordination Exercises: Stretches/mobility: Standing hip circles 10x Standing hip lateral glides with core activation and breath coordination Standing lateral trunk flexion without UE   TREATMENT  03/11/22: Manual: Soft tissue mobilization: Lt hip, bil lumbar paraspinals Scar tissue mobilization: Lt hip incision Lt breast incision Neuromuscular re-education: Core retraining:  Transversus abdominus training with multimodal cues for improved motor control and breath coordination Core facilitation: Pelvic tilts with breath coordination and abdominal activation with exhale Standing march 2 x 10 Exercises: Stretches/mobility: Standing hip circles bil, 2 x 10 Self-care: Self-scar tissue massage  DATE: 03/03/22  EVAL  Neuromuscular re-education: Down training: Diaphragmatic breathing for anxiety/pain management and improved abdominal/thoracic mobility Self-care: Self-scar tissue mobilization for bil breasts and Lt hip - light touch to tolerance, 5 min a day       PATIENT EDUCATION:  Education details: see above self-care Person educated: Patient Education method: Explanation, Demonstration, Tactile cues, Verbal cues, and Handouts Education comprehension: verbalized understanding   HOME EXERCISE PROGRAM: Written handout   ASSESSMENT:   CLINICAL IMPRESSION: Pt is beginning to see progress with overall pain reduction, although this last week was difficult due to more time spent in the car and less time with exercises. She is feeling stronger in exercises demonstrated by ability to increase repetitions. Negative pressure instrument assisted manual has been very helpful over more sensitive areas and to decrease scar tissue restriction in Lt posterolateral hip area. More time spent in Rt lumbar paraspinals and Rt SIJ today due to pain here from having to lie on this side for long periods of time; good response to manual techniques. She reported increase in pain at end of session, but stated that it was a good pain. She will continue to benefit from skilled PT  intervention in order to decrease pain, improve functional ability, increase ease of starting stream/emptying bladder, and improve QOL.    OBJECTIVE IMPAIRMENTS: decreased activity tolerance, decreased coordination, decreased endurance, decreased mobility, decreased ROM, decreased strength, hypomobility, increased fascial restrictions, increased muscle spasms, impaired flexibility, impaired tone, postural dysfunction, and pain.    ACTIVITY LIMITATIONS: bending, sitting, standing, stairs, transfers, bed mobility, and locomotion level   PARTICIPATION LIMITATIONS: cleaning, laundry, interpersonal relationship, driving, and community activity   PERSONAL FACTORS: 3+ comorbidities: Breast augmentation surgeries with follow-up reduction with complication, c/s, gastric bypass, bil tubal ligation, long history of chronic low back/pelvic pain, pudendal neuralgia, coccydynia, Lt glut med repair  are also affecting patient's functional outcome.    REHAB POTENTIAL: Good   CLINICAL DECISION MAKING: Stable/uncomplicated   EVALUATION COMPLEXITY: Low     GOALS: Goals reviewed with patient? Yes   SHORT TERM GOALS: Target date: 03/31/2022 - updated 03/26/22   Pt will be independent with HEP.    Baseline: Goal status: MET 03/26/22   2.  Pt will be independent with diaphragmatic breathing and down training activities in order to improve pelvic floor relaxation and abdominal wall mobility.   Baseline:  Goal status: IN PROGRESS   3.  Pt will report improve stream initiation and complete voiding with urination in order to improve QOL. Baseline:  Goal status: IN PROGRESS              4.  Pt will increase all impaired lumbar A/ROM by 25% without pain.   Baseline:  Goal status: IN PROGRESS   LONG TERM GOALS: Target date: 04/28/2022 - updated 03/26/22   Pt will be independent with advanced HEP.    Baseline:  Goal status:IN PROGRESS   2.  Pt will be able to use regular toilet without  difficulty. Baseline: Using high toilet chair Goal status: IN PROGRESS   3.  Pt will be able to ascend/descend 12 steps at regular intervals throughout the day safely and with minimal difficulty.  Baseline:  Goal status: IN PROGRESS   4.  Pt will improve 5xSTS to less than 20 seconds and improved PFIQ by 25% in order to demonstrate improved QOL and functional ability.  Baseline: 28 seconds Goal status: IN PROGRESS   5.  Patient will report no greater than 5/10  pain with sitting in order to more easily attend appointments, drive, and spend time with family.  Baseline:  Goal status:IN PROGRESS   6.  Pt will demonstrate increase in all impaired hip strength by 1 muscle grades in order to demonstrate improved lumbopelvic support and increase functional ability.      Baseline:  Goal status: IN PROGRESS   PLAN:   PT FREQUENCY: 1x/week   PT DURATION: 8 weeks   PLANNED INTERVENTIONS: Therapeutic exercises, Therapeutic activity, Neuromuscular re-education, Balance training, Gait training, Patient/Family education, Self Care, Joint mobilization, Aquatic Therapy, Dry Needling, Biofeedback, and Manual therapy   PLAN FOR NEXT SESSION: Manual techniques to help address pain and scar tissue restriction; gentle mobility and strengthening activities.    Heather Roberts, PT, DPT11/29/238:50 AM

## 2022-03-27 ENCOUNTER — Encounter (HOSPITAL_BASED_OUTPATIENT_CLINIC_OR_DEPARTMENT_OTHER): Payer: Self-pay | Admitting: Physical Therapy

## 2022-03-27 ENCOUNTER — Ambulatory Visit (HOSPITAL_BASED_OUTPATIENT_CLINIC_OR_DEPARTMENT_OTHER): Payer: PPO | Admitting: Physical Therapy

## 2022-03-27 DIAGNOSIS — M6281 Muscle weakness (generalized): Secondary | ICD-10-CM

## 2022-03-27 DIAGNOSIS — M62838 Other muscle spasm: Secondary | ICD-10-CM

## 2022-03-27 DIAGNOSIS — R279 Unspecified lack of coordination: Secondary | ICD-10-CM

## 2022-03-27 DIAGNOSIS — R293 Abnormal posture: Secondary | ICD-10-CM

## 2022-03-27 NOTE — Therapy (Signed)
OUTPATIENT PHYSICAL THERAPY LOWER EXTREMITY TREATMENT   Patient Name: Katelyn [name] MRN: [id number] DOB:1976-03-07, 46 y.o., female Today's Date: 03/27/2022   PT End of Session - 03/27/22 0856     Visit Number 11    Number of Visits 15    Date for PT Re-Evaluation 04/26/22    Authorization Type HTA    PT Start Time 0845    PT Stop Time 0925    PT Time Calculation (min) 40 min    Activity Tolerance Patient limited by pain    Behavior During Therapy [facility] for tasks assessed/performed                  Past Medical History:  Diagnosis Date   Anemia    Anorexia    Anxiety    Asthma    controlled with meds   Benign juvenile melanoma    Chronic headaches    Colon polyps    found on colonoscopy 50/53/9767   Complication of anesthesia    itching after epidural for c section   Constipation    Depression    Depression    several suicide attempts, hospitaluzed in 2012 for this; hx pf ECT treatments ; pt sees Dr. Adele Schilder pyschiatrist  and doing well on medication   Dysrhythmia    hx of PVC's (pt hasn't seen cardiologist since 2021- no follow up needed)   GERD (gastroesophageal reflux disease)    Headache    Heart murmur    as a child - no one mentions hearing murmur anymore, sometimes it is heard. Echo has been performed 02/03/18   Heartburn    no meds   History of pneumonia    x 3  years ago - no recent problems   Hx of blood clots    Left renal atrophy 12/19/2021   Neuromuscular disorder (HCC)    small fiber neuropathy   Obesity    Pneumonia    Polycystic ovary    takes metformin to treat   Renal atrophy, left 2023   Skin lesion    Excisional biopsy of moles - none cancerous   Sleep apnea    yrs ago - diagnosed mild sleep apnea - did not have to use cpap    Past Surgical History:  Procedure Laterality Date   BREAST CAPSULECTOMY Bilateral 12/13/2020   1 week later pt had hematoma surgery on left breast   BREAST ENHANCEMENT SURGERY Bilateral 02/11/2017    BREATH TEK H PYLORI N/A 07/22/2013   Procedure: BREATH TEK H PYLORI;  Surgeon: Edward Jolly, MD;  Location: [facility];  Service: General;  Laterality: N/A;   CESAREAN SECTION  2004, 2007   x 2   COLONOSCOPY  2013   CYSTOSCOPY WITH URETHRAL DILATATION  age 11    GASTRIC ROUX-EN-Y N/A 10/31/2013   Procedure: LAPAROSCOPIC ROUX-EN-Y GASTRIC BYPASS WITH UPPER ENDOSCOPY;  Surgeon: Edward Jolly, MD;  Location: [facility];  Service: General;  Laterality: N/A;   GLUTEUS MINIMUS REPAIR Left 05/28/2021   Procedure: Left [place];  Surgeon: Vanetta Mulders, MD;  Location: [place];  Service: Orthopedics;  Laterality: Left;   ingrown toenail Right    Ingrown nail on great right toe   kidney stent  08/28/2014   mole excision     "benign juvenile melanoma" removed from left leg - inner thigh   POLYPECTOMY     2013   SKIN LESION EXCISION     back   TONSILLECTOMY  age 25  TUBAL LIGATION  2018   Patient Active Problem List   Diagnosis Date Noted   Left renal atrophy 12/19/2021   Stage 2 chronic kidney disease 11/20/2021   Meningitis 09/27/2021   Epigastric pain 09/27/2021   GERD (gastroesophageal reflux disease) 09/27/2021   Rash and nonspecific skin eruption 09/27/2021   Hypocalcemia 09/27/2021   Chronic pain syndrome 09/27/2021   Sepsis ([place])    Fever 09/25/2021   Tear of left gluteus medius tendon    Asthma 07/18/2020   Vitamin B 12 deficiency 10/16/2019   Elevated LFTs 10/16/2019   Asthma, exogenous, unspecified asthma severity, uncomplicated 75/17/0017   B12 deficiency anemia 06/21/2018   Venous insufficiency 04/23/2018   Onychomycosis of toenail 04/17/2018   Chronic myofascial pain 03/18/2018   Small fiber neuropathy 03/18/2018   Idiopathic small fiber sensory neuropathy 02/23/2018   Varicose veins of both lower extremities 01/13/2018   Cervical spondylosis without myelopathy 08/28/2017   Migraine with aura and without status migrainosus, not intractable  07/22/2017   Lumbar degenerative disc disease 07/20/2017   Spondylosis of lumbar spine 07/20/2017   Coccydynia 03/12/2017   Bariatric surgery status 05/02/2016   Major depressive disorder, recurrent episode, moderate ([place]) 02/18/2016   Tachy-brady syndrome ([place]) 12/19/2015   Iron deficiency anemia 06/10/2015   Sleep disorder 06/01/2013   Generalized anxiety disorder 05/30/2013   Chronic headaches 07/01/2012   Headache 07/01/2012   Depression, major, recurrent, severe with psychosis ([place]) 06/17/2012   Severe recurrent major depressive disorder with psychotic symptoms ([place]) 06/17/2012   Family history of colonic polyps 03/03/2012   Eating disorder 12/18/2011   Nausea & vomiting 01/22/2010   Polycystic ovarian syndrome 09/01/2008   Intermittent left-sided chest pain 08/02/2008    REFERRING PROVIDER: Vanetta Mulders, MD  REFERRING DIAG:  M25.511,G89.29,M25.512 (ICD-10-CM) - Chronic pain of both shoulders  S76.012A (ICD-10-CM) - Tear of left gluteus medius tendon, initial encounter  B/L shoulder program ROM and strenghening  L hip strengthening program  THERAPY DIAG:  Abnormal posture  Other muscle spasm  Muscle weakness (generalized)  Unspecified lack of coordination  Rationale for Evaluation and Treatment Rehabilitation  ONSET DATE: most recently June 2023  SUBJECTIVE:   SUBJECTIVE STATEMENT: Pt states that she continues to get relief from [facility], but continues to have chronic pain due to compensation with sleeping on her L side.   PERTINENT HISTORY: Sensory neuropathy, chronic pain, MDD, DDD  PAIN:  Are you having pain? Yes: NPRS scale: 7-shoulder, 3-hip/10 Pain location: bil shoulder, Lt hip Pain description: see subj Aggravating factors: see subj Relieving factors: see subj  PRECAUTIONS: Other: sensory neropathy Does not tolerate seated or supine positioning well.   WEIGHT BEARING RESTRICTIONS No  FALLS:  Has patient fallen in last 6 months? No  LIVING  ENVIRONMENT: Lives with: lives with their family  OCCUPATION: not working  PLOF: Independent with basic ADLs and Independent with community mobility without device  PATIENT GOALS decr pain, incr ability and stamina, out of the bed more, up steps, dressing    OBJECTIVE:   DIAGNOSTIC FINDINGS: bil shoulder xrays unremarkable on 12/06/21  PATIENT SURVEYS:  FOTO Hip 40, Shoulder 45  COGNITION:  Overall cognitive status: Within functional limits for tasks assessed     SENSATION: Lt UE neuropathy-painful to touch, light sensation in Rt UE LLE neuropathy, both feet have a little bit  POSTURE: weight off of LLE  PALPATION: TTP at Lt hip surgical site  LOWER EXTREMITY ROM:  Active ROM- in standing Right eval Left eval  Hip flexion 78 74  Hip extension 14 20  Hip abduction 20 18   (Blank rows = not tested)  LOWER EXTREMITY MMT:  MMT Right eval Left eval  Hip flexion    Hip extension    Hip abduction    Hip adduction    Hip internal rotation    Hip external rotation    Knee flexion    Knee extension    Ankle dorsiflexion    Ankle plantarflexion    Ankle inversion    Ankle eversion     (Blank rows = not tested)   FUNCTIONAL TESTS:  6MWT 1033 ft    RPE: 8/10; pain up to 8/10 on both sides (Lt more than Rt)  GAIT: Distance walked: Rt trendelenburg- pt verbalizes sensation of instability Assistive device utilized: none at eval  AROM Right EVAL  Left EVAL  Shoulder flexion 110 112  Shoulder extension 26 22  Shoulder abduction 108 100  Shoulder adduction    Shoulder internal rotation Below Sacrum mid buttock Just past neutral not behind body  Shoulder external rotation t2 c5  (Blank rows = not tested)  UEROM: Unable to hook bra behind back Difficulty shaving under arms, brushing hair, crossing arms hurts, unable to reach over either shoulder. *pt verbalized numbness in feet with end range overpressure in standing shoulder flexion   TODAY'S  TREATMENT: Treatment                            03/27/2022:  STM to supraspinatus, biceps and UT (Bilat) due to significant tension.  Chin tucks in supine to pt tolerance.  Standing scap retraction Standing shoulder rolls in posterior direction.  Bilat wall slides to tolerance Pulleys in standing to tolerance Discussed different sleeping positions.  Introduced the idea of pain science to patient due to chronic ongoing pain.  Treatment                            03/13/2022:  STM to scar tissue under R breast per pt request. STM to supraspinatus, biceps and UT (Bilat) due to significant tension.  Chin tucks in supine.  GHJ mobs in posterior and inferior direction with PROM into flexion, Abduction to pt tolerance.    Treatment                            10/30:  [place] AAROM with SPC Traction stretch for shoulders using stretch strap from door Forward pulleys in wide tandem Open book at wall Alt shoulder flexion +bil, 1lb each hand in mirror Discussed exercise repetition and awareness    Treatment                            02/20/22:  MANUAL: STM periscap, upper traps; passive scap mobilization; thoracic & rib mobs Prone scap retraction Prone glute squeezes Mini wall squats with physioball Open books Standing W with breathing for rib cage expansion with physioball Standing wall slides into scaphion  Plank/pull off for hip hinge at silver bar  Treatment                            02/07/22:  MANUAL: STM periscap, upper traps; passive scap mobilization; thoracic & rib mobs Prone scap retraction Standing W with breathing for rib cage  expansion Standing pulleys into flexion Mini wall squat Plank/pull off for hip hinge at silver bar   PATIENT EDUCATION:  Education details: Anatomy of condition, POC, HEP, exercise form/rationale Person educated: Patient Education method: Explanation, Demonstration, Tactile cues, Verbal cues, and Handouts Education comprehension: verbalized  understanding, returned demonstration, verbal cues required, tactile cues required, and needs further education   HOME EXERCISE PROGRAM: [id number]  ASSESSMENT:  CLINICAL IMPRESSION: Pt demonstrates poor posture with elevated/ rounded shoulders and a forward head. She states that she has a hard time relaxing due to the pain, despite multiple cues throughout session to relax. Session initatied with STM to R shoulder today per pt request due to significant pain. Remainder of session with focus on mobility to tolerance. Discussions about HEP, sleeping positions with use of pillows to help alleviate shoulder pain and support neck. Ended session with introduction to pain science due to pt being in chronic pain for many years. Pt seemed interested, and states that's that she will look into it.  Pt is improving very slowly at this time. She is scheduled for an MRI, on Sunday to assess L shoulder. Pt will continue to benefit from skilled PT to address continued deficits.      OBJECTIVE IMPAIRMENTS decreased activity tolerance, difficulty walking, decreased ROM, decreased strength, increased muscle spasms, impaired flexibility, impaired sensation, improper body mechanics, postural dysfunction, and pain.   ACTIVITY LIMITATIONS carrying, lifting, bending, sitting, standing, squatting, sleeping, stairs, transfers, bed mobility, bathing, toileting, dressing, reach over head, hygiene/grooming, locomotion level, and caring for others  PARTICIPATION LIMITATIONS: meal prep, cleaning, laundry, driving, shopping, and community activity  PERSONAL FACTORS 3+ comorbidities: see PMH  are also affecting patient's functional outcome.     GOALS: Goals reviewed with patient? Yes  SHORT TERM GOALS: Target date: 02/21/2022  GHJ IR to midline bilaterally Baseline: Goal status: achieved when done passively, pt reported Lt AC joint pain  2.  Pt will verbalize awareness of equal WB and more frequent  corrections Baseline:  Goal status: achieved  3.  Independent in HEP as it has been established in the short term Baseline:  Goal status: achieved   LONG TERM GOALS: Target date: 03/28/22  Pt will meet FOTO goals for hip and shoulder Baseline:  Goal status: INITIAL  2.  6 MWT to distance greater than 50-59 age normative values Baseline:  Goal status: INITIAL  3.  Able to fix hair independently pain <=3/10 Baseline:  Goal status: INITIAL  4.  Able to dress with minimal difficulty for shoulders and hip Baseline:  Goal status: INITIAL  5.  Able to navigate stairs with reciprocal pattern Baseline:  Goal status: INITIAL  6.  Able to tolerate ADLs without requiring long periods in her bed due to pain Baseline:  Goal status: INITIAL   PLAN: PT FREQUENCY: 1-2x/week  PT DURATION: 12 weeks  PLANNED INTERVENTIONS: Therapeutic exercises, Therapeutic activity, Neuromuscular re-education, Balance training, Gait training, Patient/Family education, Self Care, Joint mobilization, Stair training, Aquatic Therapy, Dry Needling, Electrical stimulation, Spinal mobilization, Cryotherapy, Moist heat, Taping, Manual therapy, and Re-evaluation  PLAN FOR NEXT SESSION: continue prone manual work, mobility as tolerated. Review MRI results if present. Discuss POC.   Jessica C. Hightower PT, DPT 03/27/22 9:33 AM

## 2022-03-30 ENCOUNTER — Ambulatory Visit
Admission: RE | Admit: 2022-03-30 | Discharge: 2022-03-30 | Disposition: A | Discharge status: Home / Self Care | Payer: PPO | Source: Ambulatory Visit | Attending: Orthopaedic Surgery | Admitting: Orthopaedic Surgery

## 2022-03-30 DIAGNOSIS — M75102 Unspecified rotator cuff tear or rupture of left shoulder, not specified as traumatic: Secondary | ICD-10-CM | POA: Diagnosis not present

## 2022-03-30 DIAGNOSIS — M7500 Adhesive capsulitis of unspecified shoulder: Secondary | ICD-10-CM

## 2022-03-31 DIAGNOSIS — F5001 Anorexia nervosa, restricting type: Secondary | ICD-10-CM | POA: Diagnosis not present

## 2022-03-31 DIAGNOSIS — F411 Generalized anxiety disorder: Secondary | ICD-10-CM | POA: Diagnosis not present

## 2022-03-31 DIAGNOSIS — F331 Major depressive disorder, recurrent, moderate: Secondary | ICD-10-CM | POA: Diagnosis not present

## 2022-04-01 ENCOUNTER — Telehealth: Payer: Self-pay | Admitting: *Deleted

## 2022-04-01 ENCOUNTER — Ambulatory Visit: Payer: PPO

## 2022-04-01 NOTE — Patient Outreach (Signed)
  Care Coordination   04/01/2022 Name: Katelyn [name] MRN: [id number] DOB: 09/12/1975   Care Coordination Outreach Attempts:  A third unsuccessful outreach was attempted today to offer the patient with information about available care coordination services as a benefit of their health plan.  ADVISED IN MESSAGE THAT THIS WOULD BE LAST OUTREACH.  Follow Up Plan:  No further outreach attempts will be made at this time. We have been unable to contact the patient to offer or enroll patient in care coordination services  Encounter Outcome:  No Answer   Care Coordination Interventions:  No, not indicated    SIG Sheriece Jefcoat C. Myrtie Neither, MSN, [facility] Gerontological Nurse Practitioner [facility] Care Management [phone]

## 2022-04-03 ENCOUNTER — Encounter: Payer: Self-pay | Admitting: Hematology

## 2022-04-04 ENCOUNTER — Encounter (HOSPITAL_BASED_OUTPATIENT_CLINIC_OR_DEPARTMENT_OTHER): Payer: Self-pay | Admitting: Physical Therapy

## 2022-04-04 ENCOUNTER — Ambulatory Visit (HOSPITAL_BASED_OUTPATIENT_CLINIC_OR_DEPARTMENT_OTHER): Payer: PPO | Attending: Orthopaedic Surgery | Admitting: Physical Therapy

## 2022-04-04 DIAGNOSIS — M6281 Muscle weakness (generalized): Secondary | ICD-10-CM | POA: Diagnosis not present

## 2022-04-04 DIAGNOSIS — R293 Abnormal posture: Secondary | ICD-10-CM | POA: Diagnosis not present

## 2022-04-04 DIAGNOSIS — M62838 Other muscle spasm: Secondary | ICD-10-CM | POA: Diagnosis not present

## 2022-04-04 NOTE — Therapy (Signed)
OUTPATIENT PHYSICAL THERAPY LOWER EXTREMITY TREATMENT   Patient Name: Katelyn [name] MRN: [id number] DOB:December 12, 1975, 46 y.o., female Today's Date: 04/04/2022   PT End of Session - 04/04/22 1104     Visit Number 12    Number of Visits 15    Date for PT Re-Evaluation 04/26/22    Authorization Type HTA    PT Start Time 1103    PT Stop Time 1141    PT Time Calculation (min) 38 min    Activity Tolerance Patient limited by pain    Behavior During Therapy [facility] for tasks assessed/performed                  Past Medical History:  Diagnosis Date   Anemia    Anorexia    Anxiety    Asthma    controlled with meds   Benign juvenile melanoma    Chronic headaches    Colon polyps    found on colonoscopy 01/60/1093   Complication of anesthesia    itching after epidural for c section   Constipation    Depression    Depression    several suicide attempts, hospitaluzed in 2012 for this; hx pf ECT treatments ; pt sees Dr. Adele Schilder pyschiatrist  and doing well on medication   Dysrhythmia    hx of PVC's (pt hasn't seen cardiologist since 2021- no follow up needed)   GERD (gastroesophageal reflux disease)    Headache    Heart murmur    as a child - no one mentions hearing murmur anymore, sometimes it is heard. Echo has been performed 02/03/18   Heartburn    no meds   History of pneumonia    x 3  years ago - no recent problems   Hx of blood clots    Left renal atrophy 12/19/2021   Neuromuscular disorder (HCC)    small fiber neuropathy   Obesity    Pneumonia    Polycystic ovary    takes metformin to treat   Renal atrophy, left 2023   Skin lesion    Excisional biopsy of moles - none cancerous   Sleep apnea    yrs ago - diagnosed mild sleep apnea - did not have to use cpap    Past Surgical History:  Procedure Laterality Date   BREAST CAPSULECTOMY Bilateral 12/13/2020   1 week later pt had hematoma surgery on left breast   BREAST ENHANCEMENT SURGERY Bilateral 02/11/2017    BREATH TEK H PYLORI N/A 07/22/2013   Procedure: BREATH TEK H PYLORI;  Surgeon: Edward Jolly, MD;  Location: [facility];  Service: General;  Laterality: N/A;   CESAREAN SECTION  2004, 2007   x 2   COLONOSCOPY  2013   CYSTOSCOPY WITH URETHRAL DILATATION  age 54    GASTRIC ROUX-EN-Y N/A 10/31/2013   Procedure: LAPAROSCOPIC ROUX-EN-Y GASTRIC BYPASS WITH UPPER ENDOSCOPY;  Surgeon: Edward Jolly, MD;  Location: [facility];  Service: General;  Laterality: N/A;   GLUTEUS MINIMUS REPAIR Left 05/28/2021   Procedure: Left [place];  Surgeon: Vanetta Mulders, MD;  Location: [facility];  Service: Orthopedics;  Laterality: Left;   ingrown toenail Right    Ingrown nail on great right toe   kidney stent  08/28/2014   mole excision     "benign juvenile melanoma" removed from left leg - inner thigh   POLYPECTOMY     2013   SKIN LESION EXCISION     back   TONSILLECTOMY  age 80  TUBAL LIGATION  2018   Patient Active Problem List   Diagnosis Date Noted   Left renal atrophy 12/19/2021   Stage 2 chronic kidney disease 11/20/2021   Meningitis 09/27/2021   Epigastric pain 09/27/2021   GERD (gastroesophageal reflux disease) 09/27/2021   Rash and nonspecific skin eruption 09/27/2021   Hypocalcemia 09/27/2021   Chronic pain syndrome 09/27/2021   Sepsis ([place])    Fever 09/25/2021   Tear of left gluteus medius tendon    Asthma 07/18/2020   Vitamin B 12 deficiency 10/16/2019   Elevated LFTs 10/16/2019   Asthma, exogenous, unspecified asthma severity, uncomplicated 20/94/7096   B12 deficiency anemia 06/21/2018   Venous insufficiency 04/23/2018   Onychomycosis of toenail 04/17/2018   Chronic myofascial pain 03/18/2018   Small fiber neuropathy 03/18/2018   Idiopathic small fiber sensory neuropathy 02/23/2018   Varicose veins of both lower extremities 01/13/2018   Cervical spondylosis without myelopathy 08/28/2017   Migraine with aura and without status migrainosus, not intractable  07/22/2017   Lumbar degenerative disc disease 07/20/2017   Spondylosis of lumbar spine 07/20/2017   Coccydynia 03/12/2017   Bariatric surgery status 05/02/2016   Major depressive disorder, recurrent episode, moderate ([place]) 02/18/2016   Tachy-brady syndrome ([place]) 12/19/2015   Iron deficiency anemia 06/10/2015   Sleep disorder 06/01/2013   Generalized anxiety disorder 05/30/2013   Chronic headaches 07/01/2012   Headache 07/01/2012   Depression, major, recurrent, severe with psychosis ([place]) 06/17/2012   Severe recurrent major depressive disorder with psychotic symptoms ([place]) 06/17/2012   Family history of colonic polyps 03/03/2012   Eating disorder 12/18/2011   Nausea & vomiting 01/22/2010   Polycystic ovarian syndrome 09/01/2008   Intermittent left-sided chest pain 08/02/2008    REFERRING PROVIDER: Vanetta Mulders, MD  REFERRING DIAG:  M25.511,G89.29,M25.512 (ICD-10-CM) - Chronic pain of both shoulders  S76.012A (ICD-10-CM) - Tear of left gluteus medius tendon, initial encounter  B/L shoulder program ROM and strenghening  L hip strengthening program  THERAPY DIAG:  Abnormal posture  Other muscle spasm  Muscle weakness (generalized)  Rationale for Evaluation and Treatment Rehabilitation  ONSET DATE: most recently June 2023  SUBJECTIVE:   SUBJECTIVE STATEMENT: Got my MRI but was in excruciating pain by the end. F/u with MD on Wed.   PERTINENT HISTORY: Sensory neuropathy, chronic pain, MDD, DDD  PAIN:  Are you having pain? Yes: NPRS scale: 7-shoulder, 3-hip/10 Pain location: bil shoulder, Lt hip Pain description: see subj Aggravating factors: see subj Relieving factors: see subj  PRECAUTIONS: Other: sensory neropathy Does not tolerate seated or supine positioning well.   WEIGHT BEARING RESTRICTIONS No  FALLS:  Has patient fallen in last 6 months? No  LIVING ENVIRONMENT: Lives with: lives with their family  OCCUPATION: not working  PLOF: Independent with  basic ADLs and Independent with community mobility without device  PATIENT GOALS decr pain, incr ability and stamina, out of the bed more, up steps, dressing    OBJECTIVE:   DIAGNOSTIC FINDINGS: bil shoulder xrays unremarkable on 12/06/21  MRI 03/30/22 IMPRESSION: 1. Mild tendinosis of the supraspinatus tendon with a tiny interstitial tear. 2. Mild tendinosis of the infraspinatus tendon. 3. Mild tendinosis of the intra-articular portion of the long head of the biceps tendon adjacent to the bicipital-labral complex.  PATIENT SURVEYS:  FOTO Hip 40, Shoulder 45 FOTO 04/04/22: hip 43, shoulder 48  COGNITION:  Overall cognitive status: Within functional limits for tasks assessed     SENSATION: Lt UE neuropathy-painful to touch, light sensation in Rt UE LLE  neuropathy, both feet have a little bit  POSTURE: stands in neutral posture with swaying side to side  PALPATION: TTP at Lt hip surgical site  LOWER EXTREMITY ROM:  Active ROM- in standing Right eval Left eval Rt/Lt 12/8  Hip flexion 78 74 82/70  Hip extension 14 20 40/34  Hip abduction 20 18 30/20   (Blank rows = not tested)    FUNCTIONAL TESTS:  6MWT 1033 ft    RPE: 8/10; pain up to 8/10 on both sides (Lt more than Rt)  GAIT: Distance walked: Rt trendelenburg- pt verbalizes sensation of instability Assistive device utilized: none at eval  AROM Right EVAL  Left EVAL Rt/Lt 12/8  Shoulder flexion 110 112 130/100  Shoulder extension 26 22 32/30  Shoulder abduction 108 100 120/80  Shoulder adduction     Shoulder internal rotation Below Sacrum mid buttock Just past neutral not behind body To ipsilateral sacral border/to midline of buttock  Shoulder external rotation t2 c5 T2/C4  (Blank rows = not tested)  UEROM: Unable to hook bra behind back Difficulty shaving under arms, brushing hair, crossing arms hurts, unable to reach over either shoulder. *pt verbalized numbness in feet with end range overpressure in  standing shoulder flexion   TODAY'S TREATMENT: Treatment                            04/04/22:  Objective measures PROM shoulder & pec stretching; STM Lt suboccipitals Supine pec stretch and overhead flexion stretch with cane   Treatment                            03/27/2022:  STM to supraspinatus, biceps and UT (Bilat) due to significant tension.  Chin tucks in supine to pt tolerance.  Standing scap retraction Standing shoulder rolls in posterior direction.  Bilat wall slides to tolerance Pulleys in standing to tolerance Discussed different sleeping positions.  Introduced the idea of pain science to patient due to chronic ongoing pain.  Treatment                            03/13/2022:  STM to scar tissue under R breast per pt request. STM to supraspinatus, biceps and UT (Bilat) due to significant tension.  Chin tucks in supine.  GHJ mobs in posterior and inferior direction with PROM into flexion, Abduction to pt tolerance.    Treatment                            10/30:  [place] AAROM with SPC Traction stretch for shoulders using stretch strap from door Forward pulleys in wide tandem Open book at wall Alt shoulder flexion +bil, 1lb each hand in mirror Discussed exercise repetition and awareness    Treatment                            02/20/22:  MANUAL: STM periscap, upper traps; passive scap mobilization; thoracic & rib mobs Prone scap retraction Prone glute squeezes Mini wall squats with physioball Open books Standing W with breathing for rib cage expansion with physioball Standing wall slides into scaphion  Plank/pull off for hip hinge at silver bar  Treatment  02/07/22:  MANUAL: STM periscap, upper traps; passive scap mobilization; thoracic & rib mobs Prone scap retraction Standing W with breathing for rib cage expansion Standing pulleys into flexion Mini wall squat Plank/pull off for hip hinge at silver bar   PATIENT EDUCATION:   Education details: Anatomy of condition, POC, HEP, exercise form/rationale Person educated: Patient Education method: Explanation, Demonstration, Tactile cues, Verbal cues, and Handouts Education comprehension: verbalized understanding, returned demonstration, verbal cues required, tactile cues required, and needs further education   HOME EXERCISE PROGRAM: [id number]  ASSESSMENT:  CLINICAL IMPRESSION: Patient demonstrates decreased active range of motion in the left shoulder compared to readings taking out evaluation.  Passively in supine left shoulder is able to move farther with stretchy end feel but patient reports pain.  Added home pectoralis stretches that she can do without hurting to reduce forward shoulder roll.  I had her scheduled for the end of January today however will depend on follow-up with MD on Wednesday to determine if she is going to choose surgery as an option for her shoulder.  OBJECTIVE IMPAIRMENTS decreased activity tolerance, difficulty walking, decreased ROM, decreased strength, increased muscle spasms, impaired flexibility, impaired sensation, improper body mechanics, postural dysfunction, and pain.   ACTIVITY LIMITATIONS carrying, lifting, bending, sitting, standing, squatting, sleeping, stairs, transfers, bed mobility, bathing, toileting, dressing, reach over head, hygiene/grooming, locomotion level, and caring for others  PARTICIPATION LIMITATIONS: meal prep, cleaning, laundry, driving, shopping, and community activity  PERSONAL FACTORS 3+ comorbidities: see PMH  are also affecting patient's functional outcome.     GOALS: Goals reviewed with patient? Yes  SHORT TERM GOALS: Target date: 02/21/2022  GHJ IR to midline bilaterally Baseline: Goal status: achieved when done passively, pt reported Lt AC joint pain  2.  Pt will verbalize awareness of equal WB and more frequent corrections Baseline:  Goal status: achieved  3.  Independent in HEP as it has been  established in the short term Baseline:  Goal status: achieved   LONG TERM GOALS: Target date: 03/28/22  Pt will meet FOTO goals for hip and shoulder Baseline:  Goal status: INITIAL  2.  6 MWT to distance greater than 50-59 age normative values Baseline:  Goal status: INITIAL  3.  Able to fix hair independently pain <=3/10 Baseline: severe pain still ongoing but I feel like I can move it better Goal status:ongoing  4.  Able to dress with minimal difficulty for shoulders and hip Baseline:  Goal status: INITIAL  5.  Able to navigate stairs with reciprocal pattern Baseline:  Goal status: INITIAL  6.  Able to tolerate ADLs without requiring long periods in her bed due to pain Baseline:  Goal status: INITIAL   PLAN: PT FREQUENCY: 1-2x/week  PT DURATION: 12 weeks  PLANNED INTERVENTIONS: Therapeutic exercises, Therapeutic activity, Neuromuscular re-education, Balance training, Gait training, Patient/Family education, Self Care, Joint mobilization, Stair training, Aquatic Therapy, Dry Needling, Electrical stimulation, Spinal mobilization, Cryotherapy, Moist heat, Taping, Manual therapy, and Re-evaluation  PLAN FOR NEXT SESSION: continue prone manual work, mobility as tolerated. Complete other obj tests, outcome of MD visit?  Dayson Aboud C. Sumit Branham PT, DPT 04/04/22 12:27 PM

## 2022-04-07 DIAGNOSIS — F5001 Anorexia nervosa, restricting type: Secondary | ICD-10-CM | POA: Diagnosis not present

## 2022-04-07 DIAGNOSIS — F331 Major depressive disorder, recurrent, moderate: Secondary | ICD-10-CM | POA: Diagnosis not present

## 2022-04-07 DIAGNOSIS — F411 Generalized anxiety disorder: Secondary | ICD-10-CM | POA: Diagnosis not present

## 2022-04-09 ENCOUNTER — Ambulatory Visit (INDEPENDENT_AMBULATORY_CARE_PROVIDER_SITE_OTHER): Payer: PPO | Admitting: Orthopaedic Surgery

## 2022-04-09 DIAGNOSIS — M7502 Adhesive capsulitis of left shoulder: Secondary | ICD-10-CM

## 2022-04-09 DIAGNOSIS — M7500 Adhesive capsulitis of unspecified shoulder: Secondary | ICD-10-CM

## 2022-04-09 MED ORDER — LIDOCAINE HCL 1 % IJ SOLN
4.0000 mL | INTRAMUSCULAR | Status: AC | PRN
  Administered 2022-04-09: 4 mL
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
## 2022-04-10 ENCOUNTER — Ambulatory Visit: Payer: PPO

## 2022-04-10 ENCOUNTER — Encounter (HOSPITAL_BASED_OUTPATIENT_CLINIC_OR_DEPARTMENT_OTHER): Payer: Self-pay | Admitting: Orthopaedic Surgery

## 2022-04-10 NOTE — Therapy (Signed)
OUTPATIENT PHYSICAL THERAPY LOWER EXTREMITY TREATMENT   Patient Name: Katelyn [name] MRN: [id number] DOB:February 06, 1976, 46 y.o., female Today's Date: 04/11/2022   PT End of Session - 04/11/22 0930     Visit Number 13    Number of Visits 15    Date for PT Re-Evaluation 04/26/22    Authorization Type HTA    PT Start Time 0930    PT Stop Time 1008    PT Time Calculation (min) 38 min    Activity Tolerance Patient limited by pain    Behavior During Therapy [facility] for tasks assessed/performed                   Past Medical History:  Diagnosis Date   Anemia    Anorexia    Anxiety    Asthma    controlled with meds   Benign juvenile melanoma    Chronic headaches    Colon polyps    found on colonoscopy 40/98/1191   Complication of anesthesia    itching after epidural for c section   Constipation    Depression    Depression    several suicide attempts, hospitaluzed in 2012 for this; hx pf ECT treatments ; pt sees Dr. Adele Schilder pyschiatrist  and doing well on medication   Dysrhythmia    hx of PVC's (pt hasn't seen cardiologist since 2021- no follow up needed)   GERD (gastroesophageal reflux disease)    Headache    Heart murmur    as a child - no one mentions hearing murmur anymore, sometimes it is heard. Echo has been performed 02/03/18   Heartburn    no meds   History of pneumonia    x 3  years ago - no recent problems   Hx of blood clots    Left renal atrophy 12/19/2021   Neuromuscular disorder (HCC)    small fiber neuropathy   Obesity    Pneumonia    Polycystic ovary    takes metformin to treat   Renal atrophy, left 2023   Skin lesion    Excisional biopsy of moles - none cancerous   Sleep apnea    yrs ago - diagnosed mild sleep apnea - did not have to use cpap    Past Surgical History:  Procedure Laterality Date   BREAST CAPSULECTOMY Bilateral 12/13/2020   1 week later pt had hematoma surgery on left breast   BREAST ENHANCEMENT SURGERY Bilateral 02/11/2017    BREATH TEK H PYLORI N/A 07/22/2013   Procedure: BREATH TEK H PYLORI;  Surgeon: Edward Jolly, MD;  Location: [facility];  Service: General;  Laterality: N/A;   CESAREAN SECTION  2004, 2007   x 2   COLONOSCOPY  2013   CYSTOSCOPY WITH URETHRAL DILATATION  age 47    GASTRIC ROUX-EN-Y N/A 10/31/2013   Procedure: LAPAROSCOPIC ROUX-EN-Y GASTRIC BYPASS WITH UPPER ENDOSCOPY;  Surgeon: Edward Jolly, MD;  Location: [facility];  Service: General;  Laterality: N/A;   GLUTEUS MINIMUS REPAIR Left 05/28/2021   Procedure: Left [place];  Surgeon: Vanetta Mulders, MD;  Location: [facility];  Service: Orthopedics;  Laterality: Left;   ingrown toenail Right    Ingrown nail on great right toe   kidney stent  08/28/2014   mole excision     "benign juvenile melanoma" removed from left leg - inner thigh   POLYPECTOMY     2013   SKIN LESION EXCISION     back   TONSILLECTOMY  age  [place]  2018   Patient Active Problem List   Diagnosis Date Noted   Left renal atrophy 12/19/2021   Stage 2 chronic kidney disease 11/20/2021   Meningitis 09/27/2021   Epigastric pain 09/27/2021   GERD (gastroesophageal reflux disease) 09/27/2021   Rash and nonspecific skin eruption 09/27/2021   Hypocalcemia 09/27/2021   Chronic pain syndrome 09/27/2021   Sepsis ([place])    Fever 09/25/2021   Tear of left gluteus medius tendon    Asthma 07/18/2020   Vitamin B 12 deficiency 10/16/2019   Elevated LFTs 10/16/2019   Asthma, exogenous, unspecified asthma severity, uncomplicated 03/54/6568   B12 deficiency anemia 06/21/2018   Venous insufficiency 04/23/2018   Onychomycosis of toenail 04/17/2018   Chronic myofascial pain 03/18/2018   Small fiber neuropathy 03/18/2018   Idiopathic small fiber sensory neuropathy 02/23/2018   Varicose veins of both lower extremities 01/13/2018   Cervical spondylosis without myelopathy 08/28/2017   Migraine with aura and without status migrainosus, not intractable  07/22/2017   Lumbar degenerative disc disease 07/20/2017   Spondylosis of lumbar spine 07/20/2017   Coccydynia 03/12/2017   Bariatric surgery status 05/02/2016   Major depressive disorder, recurrent episode, moderate ([place]) 02/18/2016   Tachy-brady syndrome ([place]) 12/19/2015   Iron deficiency anemia 06/10/2015   Sleep disorder 06/01/2013   Generalized anxiety disorder 05/30/2013   Chronic headaches 07/01/2012   Headache 07/01/2012   Depression, major, recurrent, severe with psychosis ([place]) 06/17/2012   Severe recurrent major depressive disorder with psychotic symptoms ([place]) 06/17/2012   Family history of colonic polyps 03/03/2012   Eating disorder 12/18/2011   Nausea & vomiting 01/22/2010   Polycystic ovarian syndrome 09/01/2008   Intermittent left-sided chest pain 08/02/2008    REFERRING PROVIDER: Vanetta Mulders, MD  REFERRING DIAG:  M25.511,G89.29,M25.512 (ICD-10-CM) - Chronic pain of both shoulders  S76.012A (ICD-10-CM) - Tear of left gluteus medius tendon, initial encounter  B/L shoulder program ROM and strenghening  L hip strengthening program  THERAPY DIAG:  Abnormal posture  Other muscle spasm  Muscle weakness (generalized)  Rationale for Evaluation and Treatment Rehabilitation  ONSET DATE: most recently June 2023  SUBJECTIVE:   SUBJECTIVE STATEMENT: 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 Right hand is very painful and numbness increasing- NCV testing with Dr Ernestina Patches next Wed. Arm and face were very red and had a fever after steroid injection. In ABD+ER feels anterior pain.   PERTINENT HISTORY: Sensory neuropathy, chronic pain, MDD, DDD  PAIN:  Are you having pain? Yes: NPRS scale: 7-shoulder, 3-hip/10 Pain location: bil shoulder, Lt hip Pain description: see subj Aggravating factors: see subj Relieving factors: see subj  PRECAUTIONS: Other: sensory neropathy Does not tolerate seated or supine positioning well.   WEIGHT BEARING RESTRICTIONS No  FALLS:   Has patient fallen in last 6 months? No  LIVING ENVIRONMENT: Lives with: lives with their family  OCCUPATION: not working  PLOF: Independent with basic ADLs and Independent with community mobility without device  PATIENT GOALS decr pain, incr ability and stamina, out of the bed more, up steps, dressing    OBJECTIVE:   DIAGNOSTIC FINDINGS: bil shoulder xrays unremarkable on 12/06/21  MRI 03/30/22 IMPRESSION: 1. Mild tendinosis of the supraspinatus tendon with a tiny interstitial tear. 2. Mild tendinosis of the infraspinatus tendon. 3. Mild tendinosis of the intra-articular portion of the long head of the biceps tendon adjacent to the bicipital-labral complex.  PATIENT SURVEYS:  FOTO Hip 40, Shoulder 45 FOTO 04/04/22: hip 43, shoulder 48  COGNITION:  Overall cognitive status: Within functional limits for tasks assessed     SENSATION: Lt UE neuropathy-painful to touch, light sensation in Rt UE LLE neuropathy, both feet have a little bit  POSTURE: stands in neutral posture with swaying side to side  PALPATION: TTP at Lt hip surgical site  LOWER EXTREMITY ROM:  Active ROM- in standing Right eval Left eval Rt/Lt 12/8  Hip flexion 78 74 82/70  Hip extension 14 20 40/34  Hip abduction 20 18 30/20   (Blank rows = not tested)    FUNCTIONAL TESTS:  6MWT 1033 ft    RPE: 8/10; pain up to 8/10 on both sides (Lt more than Rt)  GAIT: Distance walked: Rt trendelenburg- pt verbalizes sensation of instability Assistive device utilized: none at eval  AROM Right EVAL  Left EVAL Rt/Lt 12/8  Shoulder flexion 110 112 130/100  Shoulder extension 26 22 32/30  Shoulder abduction 108 100 120/80  Shoulder adduction     Shoulder internal rotation Below Sacrum mid buttock Just past neutral not behind body To ipsilateral sacral border/to midline of buttock  Shoulder external rotation t2 c5 T2/C4  (Blank rows = not tested)  UEROM: Unable to hook bra behind back Difficulty  shaving under arms, brushing hair, crossing arms hurts, unable to reach over either shoulder. *pt verbalized numbness in feet with end range overpressure in standing shoulder flexion   TODAY'S TREATMENT:  Treatment                            04/11/22:  Supine STM to upper trap, levator, supra&infra spinatus; PROM flexion & abd ; AP joint mobs with movement through abd  Standing at wall, scap retraction, elevation/depression Pulleys flexion & scaption Door pec stretch Reviewed HEP & gentle progression   Treatment                            04/04/22:  Objective measures PROM shoulder & pec stretching; STM Lt suboccipitals Supine pec stretch and overhead flexion stretch with cane   Treatment                            03/27/2022:  STM to supraspinatus, biceps and UT (Bilat) due to significant tension.  Chin tucks in supine to pt tolerance.  Standing scap retraction Standing shoulder rolls in posterior direction.  Bilat wall slides to tolerance Pulleys in standing to tolerance Discussed different sleeping positions.  Introduced the idea of pain science to patient due to chronic ongoing pain.   PATIENT EDUCATION:  Education details: Geophysicist/field seismologist of condition, POC, HEP, exercise form/rationale Person educated: Patient Education method: Explanation, Demonstration, Tactile cues, Verbal cues, and Handouts Education comprehension: verbalized understanding, returned demonstration, verbal cues required, tactile cues required, and needs further education   HOME EXERCISE PROGRAM: [id number]  ASSESSMENT:  CLINICAL IMPRESSION: Was able to achieve passive shoulder flexion to 90 today but patient consistently showed in facial expressions at this point.  Abduction to approximately and then pain began.  Sending referral to chronic pain counselor as a child patient also agreed to begin aquatic therapy in February.  OBJECTIVE IMPAIRMENTS decreased activity tolerance, difficulty walking, decreased  ROM, decreased strength, increased muscle spasms, impaired flexibility, impaired sensation, improper body mechanics, postural dysfunction, and pain.   ACTIVITY LIMITATIONS carrying, lifting, bending, sitting, standing, squatting, sleeping, stairs, transfers, bed mobility, bathing, toileting, dressing, reach over  head, hygiene/grooming, locomotion level, and caring for others  PARTICIPATION LIMITATIONS: meal prep, cleaning, laundry, driving, shopping, and community activity  PERSONAL FACTORS 3+ comorbidities: see PMH  are also affecting patient's functional outcome.     GOALS: Goals reviewed with patient? Yes  SHORT TERM GOALS: Target date: 02/21/2022  [facility] IR to midline bilaterally Baseline: Goal status: achieved when done passively, pt reported Lt AC joint pain  2.  Pt will verbalize awareness of equal WB and more frequent corrections Baseline:  Goal status: achieved  3.  Independent in HEP as it has been established in the short term Baseline:  Goal status: achieved   LONG TERM GOALS: Target date: 03/28/22  Pt will meet FOTO goals for hip and shoulder Baseline:  Goal status: INITIAL  2.  6 MWT to distance greater than 50-59 age normative values Baseline:  Goal status: INITIAL  3.  Able to fix hair independently pain <=3/10 Baseline: severe pain still ongoing but I feel like I can move it better Goal status:ongoing  4.  Able to dress with minimal difficulty for shoulders and hip Baseline:  Goal status: INITIAL  5.  Able to navigate stairs with reciprocal pattern Baseline:  Goal status: INITIAL  6.  Able to tolerate ADLs without requiring long periods in her bed due to pain Baseline:  Goal status: INITIAL   PLAN: PT FREQUENCY: 1-2x/week  PT DURATION: 12 weeks  PLANNED INTERVENTIONS: Therapeutic exercises, Therapeutic activity, Neuromuscular re-education, Balance training, Gait training, Patient/Family education, Self Care, Joint mobilization, Stair training,  Aquatic Therapy, Dry Needling, Electrical stimulation, Spinal mobilization, Cryotherapy, Moist heat, Taping, Manual therapy, and Re-evaluation  PLAN FOR NEXT SESSION: continue prone manual work, mobility as tolerated. Complete other obj tests. Extend POC  Belladonna Lubinski C. Jaquell Seddon PT, DPT 04/11/22 10:11 AM

## 2022-04-11 ENCOUNTER — Ambulatory Visit (HOSPITAL_BASED_OUTPATIENT_CLINIC_OR_DEPARTMENT_OTHER): Payer: PPO | Admitting: Orthopaedic Surgery

## 2022-04-11 ENCOUNTER — Encounter (HOSPITAL_BASED_OUTPATIENT_CLINIC_OR_DEPARTMENT_OTHER): Payer: Self-pay | Admitting: Physical Therapy

## 2022-04-11 ENCOUNTER — Ambulatory Visit (HOSPITAL_BASED_OUTPATIENT_CLINIC_OR_DEPARTMENT_OTHER): Payer: PPO | Admitting: Physical Therapy

## 2022-04-11 DIAGNOSIS — M62838 Other muscle spasm: Secondary | ICD-10-CM

## 2022-04-11 DIAGNOSIS — R293 Abnormal posture: Secondary | ICD-10-CM

## 2022-04-11 DIAGNOSIS — M6281 Muscle weakness (generalized): Secondary | ICD-10-CM

## 2022-04-14 ENCOUNTER — Ambulatory Visit: Payer: PPO | Attending: Urology

## 2022-04-14 DIAGNOSIS — M6281 Muscle weakness (generalized): Secondary | ICD-10-CM | POA: Diagnosis not present

## 2022-04-14 DIAGNOSIS — M62838 Other muscle spasm: Secondary | ICD-10-CM | POA: Diagnosis not present

## 2022-04-14 DIAGNOSIS — R279 Unspecified lack of coordination: Secondary | ICD-10-CM | POA: Diagnosis not present

## 2022-04-14 DIAGNOSIS — R293 Abnormal posture: Secondary | ICD-10-CM | POA: Diagnosis not present

## 2022-04-14 NOTE — Therapy (Signed)
OUTPATIENT PHYSICAL THERAPY TREATMENT NOTE Progress Note  Reporting Period 03/03/22 to 03/26/22  See note below for Objective Data and Assessment of Progress/Goals.    Patient Name: Katelyn [name] MRN: [id number] DOB:1976-02-07, 46 y.o., female Today's Date: 04/14/2022  PCP: Kathyrn Drown, MD  REFERRING PROVIDER: Vira Agar, MD  END OF SESSION:   PT End of Session - 04/14/22 0900     Visit Number 14    Date for PT Re-Evaluation 04/26/22    Authorization Type HTA    PT Start Time 0801    PT Stop Time 2800    PT Time Calculation (min) 42 min    Activity Tolerance Patient tolerated treatment well    Behavior During Therapy [facility] for tasks assessed/performed             Past Medical History:  Diagnosis Date   Anemia    Anorexia    Anxiety    Asthma    controlled with meds   Benign juvenile melanoma    Chronic headaches    Colon polyps    found on colonoscopy 34/91/7915   Complication of anesthesia    itching after epidural for c section   Constipation    Depression    Depression    several suicide attempts, hospitaluzed in 2012 for this; hx pf ECT treatments ; pt sees Dr. Adele Schilder pyschiatrist  and doing well on medication   Dysrhythmia    hx of PVC's (pt hasn't seen cardiologist since 2021- no follow up needed)   GERD (gastroesophageal reflux disease)    Headache    Heart murmur    as a child - no one mentions hearing murmur anymore, sometimes it is heard. Echo has been performed 02/03/18   Heartburn    no meds   History of pneumonia    x 3  years ago - no recent problems   Hx of blood clots    Left renal atrophy 12/19/2021   Neuromuscular disorder (HCC)    small fiber neuropathy   Obesity    Pneumonia    Polycystic ovary    takes metformin to treat   Renal atrophy, left 2023   Skin lesion    Excisional biopsy of moles - none cancerous   Sleep apnea    yrs ago - diagnosed mild sleep apnea - did not have to use cpap    Past Surgical History:   Procedure Laterality Date   BREAST CAPSULECTOMY Bilateral 12/13/2020   1 week later pt had hematoma surgery on left breast   BREAST ENHANCEMENT SURGERY Bilateral 02/11/2017   BREATH TEK H PYLORI N/A 07/22/2013   Procedure: BREATH TEK H PYLORI;  Surgeon: Edward Jolly, MD;  Location: [facility];  Service: General;  Laterality: N/A;   CESAREAN SECTION  2004, 2007   x 2   COLONOSCOPY  2013   CYSTOSCOPY WITH URETHRAL DILATATION  age 66    GASTRIC ROUX-EN-Y N/A 10/31/2013   Procedure: LAPAROSCOPIC ROUX-EN-Y GASTRIC BYPASS WITH UPPER ENDOSCOPY;  Surgeon: Edward Jolly, MD;  Location: [facility];  Service: General;  Laterality: N/A;   GLUTEUS MINIMUS REPAIR Left 05/28/2021   Procedure: Left [place];  Surgeon: Vanetta Mulders, MD;  Location: [facility];  Service: Orthopedics;  Laterality: Left;   ingrown toenail Right    Ingrown nail on great right toe   kidney stent  08/28/2014   mole excision     "benign juvenile melanoma" removed from left leg - inner thigh  POLYPECTOMY     2013   SKIN LESION EXCISION     back   TONSILLECTOMY  age 50   TUBAL LIGATION  2018   Patient Active Problem List   Diagnosis Date Noted   Left renal atrophy 12/19/2021   Stage 2 chronic kidney disease 11/20/2021   Meningitis 09/27/2021   Epigastric pain 09/27/2021   GERD (gastroesophageal reflux disease) 09/27/2021   Rash and nonspecific skin eruption 09/27/2021   Hypocalcemia 09/27/2021   Chronic pain syndrome 09/27/2021   Sepsis ([place])    Fever 09/25/2021   Tear of left gluteus medius tendon    Asthma 07/18/2020   Vitamin B 12 deficiency 10/16/2019   Elevated LFTs 10/16/2019   Asthma, exogenous, unspecified asthma severity, uncomplicated 25/36/6440   B12 deficiency anemia 06/21/2018   Venous insufficiency 04/23/2018   Onychomycosis of toenail 04/17/2018   Chronic myofascial pain 03/18/2018   Small fiber neuropathy 03/18/2018   Idiopathic small fiber sensory neuropathy 02/23/2018    Varicose veins of both lower extremities 01/13/2018   Cervical spondylosis without myelopathy 08/28/2017   Migraine with aura and without status migrainosus, not intractable 07/22/2017   Lumbar degenerative disc disease 07/20/2017   Spondylosis of lumbar spine 07/20/2017   Coccydynia 03/12/2017   Bariatric surgery status 05/02/2016   Major depressive disorder, recurrent episode, moderate ([place]) 02/18/2016   Tachy-brady syndrome ([place]) 12/19/2015   Iron deficiency anemia 06/10/2015   Sleep disorder 06/01/2013   Generalized anxiety disorder 05/30/2013   Chronic headaches 07/01/2012   Headache 07/01/2012   Depression, major, recurrent, severe with psychosis ([place]) 06/17/2012   Severe recurrent major depressive disorder with psychotic symptoms ([place]) 06/17/2012   Family history of colonic polyps 03/03/2012   Eating disorder 12/18/2011   Nausea & vomiting 01/22/2010   Polycystic ovarian syndrome 09/01/2008   Intermittent left-sided chest pain 08/02/2008    REFERRING DIAG: M25.511,G89.29,M25.512 (ICD-10-CM) - Chronic pain of both shoulders S76.012A (ICD-10-CM) - Tear of left gluteus medius tendon, initial encounter  THERAPY DIAG:  Abnormal posture  Muscle weakness (generalized)  Other muscle spasm  Unspecified lack of coordination  Rationale for Evaluation and Treatment Rehabilitation  PERTINENT HISTORY: Breast augmentation surgeries with follow-up reduction with complication, c/s, gastric bypass, bil tubal ligation, long history of chronic low back/pelvic pain, pudendal neuralgia, coccydynia, Lt glute med repair  PRECAUTIONS: NA  SUBJECTIVE:                                                                                                                                                                                      SUBJECTIVE STATEMENT:  Pt had steroid injection in LT shoulder last week and states that it did  not help at all. She has had much worse LT hip pain over the last two  weeks in addition to more pudendal neuralgia and bil labia pain. She has been driving and performing much more sitting over the last two weeks.    PAIN:  Are you having pain? Yes, 9/10, Lt hip and low back   03/03/22 SUBJECTIVE STATEMENT: Pt had surgery for Lt glute med tear that most likely happened during fall 3 years ago. She has still had pain in Lt hip, but she is in rehab due to it. She had meningitis in June and ended up having Bil frozen shoulders. Her biggest complaint is of back pain, which impacts her bil shoulder pain because she has to lay on either side. She mostly lies on Rt side, which causes pain in Rt low back and Rt hip. She is rarely getting pressure off of buttocks. She continues to have sacral/coccydynia with sitting and avoids this position. She is not currently having vaginal/pelvic pain, but states this is coming and going - she manages by decreasing frequency of intercourse - no 1x/week. She is having issue with Lt kidney, but MD states that urinary issues are not related.  Is currently in PT for hip and shoulders - primary working on shoulders. Is only doing some basic leg strengthening and shoulder exercises at home.  Fluid intake: Yes: -     PAIN:  Are you having pain? Yes NPRS scale: 8/10Right and Left Pain location:    Pain type: sharp and pressure Pain description: constant    Aggravating factors: sitting, driving, finding comfortable position Relieving factors: lying on stomach relieves buttock pain if she can, but shoulders prevent this   PRECAUTIONS: None   WEIGHT BEARING RESTRICTIONS: No   FALLS:  Has patient fallen in last 6 months? No   LIVING ENVIRONMENT: Lives with: lives with their family Lives in: House/apartment   OCCUPATION: disabled   PLOF: Independent   PATIENT GOALS: would like to be able to sit; decrease bil hip/low back pain   PERTINENT HISTORY:  Breast augmentation surgeries with follow-up reduction with complication, c/s,  gastric bypass, bil tubal ligation, long history of chronic low back/pelvic pain, pudendal neuralgia, coccydynia, Lt glute med repair Sexual abuse: No   BOWEL MOVEMENT: Pain with bowel movement: No Type of bowel movement:Frequency 1x/day and Strain Yes Fully empty rectum: Yes: - Leakage: No Pads: No Fiber supplement: No no medication to help stimulate bowel movements   URINATION: Pain with urination: No Fully empty bladder: No Stream:  trouble starting the stream Urgency: no Frequency: every 30 minutes to an hour - worse once she does go, then she has to keep going - easier to just hold it Leakage:  none Pads: No   INTERCOURSE: Pain with intercourse: Initial Penetration and During Penetration Ability to have vaginal penetration:  Yes: pain - only tolerates 1x/week Climax: yes Marinoff Scale: 2/3   PREGNANCY: Vaginal deliveries 0 Tearing No C-section deliveries 2 Currently pregnant No       OBJECTIVE:  03/03/22: DIAGNOSTIC FINDINGS:  Lasix renogram with no abnormal findings   PATIENT SURVEYS:    PFIQ-7 100%   COGNITION: Overall cognitive status: Within functional limits for tasks assessed                          SENSATION: Light touch: Appears intact Proprioception: Appears intact   MUSCLE LENGTH:     FUNCTIONAL TESTS:  5xSTS: 29 seconds   GAIT:   Comments: decreased bil hip extension, posterior pelvic tilt   POSTURE: rounded shoulders, forward head, increased lumbar lordosis, and posterior pelvic tilt   PELVIC ALIGNMENT:   LUMBARAROM/PROM:   A/PROM A/PROM  Eval (%)  Flexion 50, pain on Rt  Extension 20, no pain, very limited mobility  Right lateral flexion 50, pain in Rt low back  Left lateral flexion 75, pain in Rt low back  Right rotation 50, good stretch  Left rotation 50, good stretch   (Blank rows = not tested)     LOWER EXTREMITY MMT: tested in standing due to comfort             Rt: 3/5 hip abduction and extension, 4/5  adduction and flexion             Lt: 3/5 hip abduction, extension, flexion, 4/5 adduction     PALPATION:   General  tenderness throughout bil lumbar paraspinals and gluts, Lt>Rt; tenderness and spasm in thoracic paraspinals with decreased mobility upon deep breathing; scar tissue restriction under bil breasts that is tender; scar tissue restriction Lt glut med surgical incision                 External Perineal Exam NA                             Internal Pelvic Floor NA   Patient confirms identification and approves PT to assess internal pelvic floor and treatment Yes for future need   PELVIC MMT:   MMT eval  Vaginal    Internal Anal Sphincter    External Anal Sphincter    Puborectalis    Diastasis Recti    (Blank rows = not tested)         TONE: NA   PROLAPSE: NA   TODAY'S TREATMENT 04/14/22: Manual: Negative pressure soft tissue mobilization to Lt hip, glutes, sacral/coccygeal border Soft tissue mobilization to posterolateral hip muscles Scar tissue mobilization to Lt hip incision and surrounding area Neuro: Cat cow on table  Modified child's pose gliding on table Forward hold hang  TREATMENT 03/26/22: Manual: Soft tissue mobilization: Lt hip, bil lumbar paraspinals Scar tissue mobilization: Lt hip incision - negative pressure soft tissue mobilization  Lt breast incision Neuromuscular re-education: Core retraining:  Core facilitation: Form correction: Pelvic floor contraction training: Down training: Exercises: Stretches/mobility: Strengthening: Therapeutic activities: Functional strengthening activities: Self-care: We discussed HEP and decided to remain at current program due to lack of performance several days because of the holiday   TREATMENT 03/17/22: Manual: Soft tissue mobilization: Lt hip, bil lumbar paraspinals Scar tissue mobilization: Lt hip incision - negative pressure soft tissue mobilization  Lt breast incision Neuromuscular  re-education: Form correction: Standing march with hold 12x5 sec hold Standing pelvic tilts with breath coordination Exercises: Stretches/mobility: Standing hip circles 10x Standing hip lateral glides with core activation and breath coordination Standing lateral trunk flexion without UE  PATIENT EDUCATION:  Education details: see above self-care Person educated: Patient Education method: Explanation, Demonstration, Tactile cues, Verbal cues, and Handouts Education comprehension: verbalized understanding   HOME EXERCISE PROGRAM: Written handout   ASSESSMENT:   CLINICAL IMPRESSION: Pt has not been doing well over the last 2 weeks with increased Lt hip/low back pain. Due to increase in pudendal neuralgia symptoms, neural mobility exercises performed to help improve with good tolerance/no increase in symptoms. She did well with all manual techniques, but did have increased sensitivity surrounding Lt hip incision and Lt PSIS. She reported significant decrease in pain at end of session. She will continue to benefit from skilled PT intervention in order to decrease pain, improve functional ability, increase ease of starting stream/emptying bladder, and improve QOL.    OBJECTIVE IMPAIRMENTS: decreased activity tolerance, decreased coordination, decreased endurance, decreased mobility, decreased ROM, decreased strength, hypomobility, increased fascial restrictions, increased muscle spasms, impaired flexibility, impaired tone, postural dysfunction, and pain.    ACTIVITY LIMITATIONS: bending, sitting, standing, stairs, transfers, bed mobility, and locomotion level   PARTICIPATION LIMITATIONS: cleaning, laundry, interpersonal relationship, driving, and community activity   PERSONAL FACTORS: 3+ comorbidities: Breast augmentation surgeries with follow-up reduction with complication, c/s, gastric  bypass, bil tubal ligation, long history of chronic low back/pelvic pain, pudendal neuralgia, coccydynia, Lt glut med repair  are also affecting patient's functional outcome.    REHAB POTENTIAL: Good   CLINICAL DECISION MAKING: Stable/uncomplicated   EVALUATION COMPLEXITY: Low     GOALS: Goals reviewed with patient? Yes   SHORT TERM GOALS: Target date: 03/31/2022 - updated 03/26/22   Pt will be independent with HEP.    Baseline: Goal status: MET 03/26/22   2.  Pt will be independent with diaphragmatic breathing and down training activities in order to improve pelvic floor relaxation and abdominal wall mobility.   Baseline:  Goal status: IN PROGRESS   3.  Pt will report improve stream initiation and complete voiding with urination in order to improve QOL. Baseline:  Goal status: IN PROGRESS              4.  Pt will increase all impaired lumbar A/ROM by 25% without pain.   Baseline:  Goal status: IN PROGRESS   LONG TERM GOALS: Target date: 04/28/2022 - updated 03/26/22   Pt will be independent with advanced HEP.    Baseline:  Goal status:IN PROGRESS   2.  Pt will be able to use regular toilet without difficulty. Baseline: Using high toilet chair Goal status: IN PROGRESS   3.  Pt will be able to ascend/descend 12 steps at regular intervals throughout the day safely and with minimal difficulty.  Baseline:  Goal status: IN PROGRESS   4.  Pt will improve 5xSTS to less than 20 seconds and improved PFIQ by 25% in order to demonstrate improved QOL and functional ability.  Baseline: 28 seconds Goal status: IN PROGRESS   5.  Patient will report no greater than 5/10 pain with sitting in order to more easily attend appointments, drive, and spend time with family.  Baseline:  Goal status:IN PROGRESS   6.  Pt will demonstrate increase in all impaired hip strength by 1 muscle grades in order to demonstrate improved lumbopelvic support and increase functional ability.       Baseline:  Goal status: IN PROGRESS   PLAN:   PT FREQUENCY: 1x/week   PT DURATION: 8 weeks   PLANNED INTERVENTIONS: Therapeutic exercises, Therapeutic activity, Neuromuscular re-education, Balance training, Gait training, Patient/Family education,  Self Care, Joint mobilization, Aquatic Therapy, Dry Needling, Biofeedback, and Manual therapy   PLAN FOR NEXT SESSION: Manual techniques to help address pain and scar tissue restriction; gentle mobility and strengthening activities.    Heather Roberts, PT, DPT12/18/239:36 AM

## 2022-04-16 ENCOUNTER — Ambulatory Visit (INDEPENDENT_AMBULATORY_CARE_PROVIDER_SITE_OTHER): Payer: PPO | Admitting: Physical Medicine and Rehabilitation

## 2022-04-16 DIAGNOSIS — R202 Paresthesia of skin: Secondary | ICD-10-CM

## 2022-04-16 NOTE — Progress Notes (Signed)
Functional Pain Scale - descriptive words and definitions  Unmanageable (7)  Pain interferes with normal ADL's/nothing seems to help/sleep is very difficult/active distractions are very difficult to concentrate on. Severe range order  Average Pain 7  Right handed. Bilateral hand numbness that radiates up the forearm. Pain is between the thumb and first finger

## 2022-04-17 NOTE — Therapy (Signed)
OUTPATIENT PHYSICAL THERAPY LOWER EXTREMITY TREATMENT   Patient Name: Katelyn [name] MRN: [id number] DOB:03/06/1976, 46 y.o., female Today's Date: 04/18/2022   PT End of Session - 04/18/22 1102     Visit Number 15    Date for PT Re-Evaluation 06/27/22    Authorization Type HTA    PT Start Time 1100    PT Stop Time 1144    PT Time Calculation (min) 44 min    Activity Tolerance Patient tolerated treatment well    Behavior During Therapy [facility] for tasks assessed/performed                    Past Medical History:  Diagnosis Date   Anemia    Anorexia    Anxiety    Asthma    controlled with meds   Benign juvenile melanoma    Chronic headaches    Colon polyps    found on colonoscopy 28/41/3244   Complication of anesthesia    itching after epidural for c section   Constipation    Depression    Depression    several suicide attempts, hospitaluzed in 2012 for this; hx pf ECT treatments ; pt sees Dr. Adele Schilder pyschiatrist  and doing well on medication   Dysrhythmia    hx of PVC's (pt hasn't seen cardiologist since 2021- no follow up needed)   GERD (gastroesophageal reflux disease)    Headache    Heart murmur    as a child - no one mentions hearing murmur anymore, sometimes it is heard. Echo has been performed 02/03/18   Heartburn    no meds   History of pneumonia    x 3  years ago - no recent problems   Hx of blood clots    Left renal atrophy 12/19/2021   Neuromuscular disorder (HCC)    small fiber neuropathy   Obesity    Pneumonia    Polycystic ovary    takes metformin to treat   Renal atrophy, left 2023   Skin lesion    Excisional biopsy of moles - none cancerous   Sleep apnea    yrs ago - diagnosed mild sleep apnea - did not have to use cpap    Past Surgical History:  Procedure Laterality Date   BREAST CAPSULECTOMY Bilateral 12/13/2020   1 week later pt had hematoma surgery on left breast   BREAST ENHANCEMENT SURGERY Bilateral 02/11/2017   BREATH  TEK H PYLORI N/A 07/22/2013   Procedure: BREATH TEK H PYLORI;  Surgeon: Edward Jolly, MD;  Location: [facility];  Service: General;  Laterality: N/A;   CESAREAN SECTION  2004, 2007   x 2   COLONOSCOPY  2013   CYSTOSCOPY WITH URETHRAL DILATATION  age 35    GASTRIC ROUX-EN-Y N/A 10/31/2013   Procedure: LAPAROSCOPIC ROUX-EN-Y GASTRIC BYPASS WITH UPPER ENDOSCOPY;  Surgeon: Edward Jolly, MD;  Location: [facility];  Service: General;  Laterality: N/A;   GLUTEUS MINIMUS REPAIR Left 05/28/2021   Procedure: Left [place];  Surgeon: Vanetta Mulders, MD;  Location: [facility];  Service: Orthopedics;  Laterality: Left;   ingrown toenail Right    Ingrown nail on great right toe   kidney stent  08/28/2014   mole excision     "benign juvenile melanoma" removed from left leg - inner thigh   POLYPECTOMY     2013   SKIN LESION EXCISION     back   TONSILLECTOMY  age 56   TUBAL LIGATION  2018   Patient Active Problem List   Diagnosis Date Noted   Left renal atrophy 12/19/2021   Stage 2 chronic kidney disease 11/20/2021   Meningitis 09/27/2021   Epigastric pain 09/27/2021   GERD (gastroesophageal reflux disease) 09/27/2021   Rash and nonspecific skin eruption 09/27/2021   Hypocalcemia 09/27/2021   Chronic pain syndrome 09/27/2021   Sepsis ([place])    Fever 09/25/2021   Tear of left gluteus medius tendon    Asthma 07/18/2020   Vitamin B 12 deficiency 10/16/2019   Elevated LFTs 10/16/2019   Asthma, exogenous, unspecified asthma severity, uncomplicated 65/78/4696   B12 deficiency anemia 06/21/2018   Venous insufficiency 04/23/2018   Onychomycosis of toenail 04/17/2018   Chronic myofascial pain 03/18/2018   Small fiber neuropathy 03/18/2018   Idiopathic small fiber sensory neuropathy 02/23/2018   Varicose veins of both lower extremities 01/13/2018   Cervical spondylosis without myelopathy 08/28/2017   Migraine with aura and without status migrainosus, not intractable 07/22/2017    Lumbar degenerative disc disease 07/20/2017   Spondylosis of lumbar spine 07/20/2017   Coccydynia 03/12/2017   Bariatric surgery status 05/02/2016   Major depressive disorder, recurrent episode, moderate ([place]) 02/18/2016   Tachy-brady syndrome ([place]) 12/19/2015   Iron deficiency anemia 06/10/2015   Sleep disorder 06/01/2013   Generalized anxiety disorder 05/30/2013   Chronic headaches 07/01/2012   Headache 07/01/2012   Depression, major, recurrent, severe with psychosis ([place]) 06/17/2012   Severe recurrent major depressive disorder with psychotic symptoms ([place]) 06/17/2012   Family history of colonic polyps 03/03/2012   Eating disorder 12/18/2011   Nausea & vomiting 01/22/2010   Polycystic ovarian syndrome 09/01/2008   Intermittent left-sided chest pain 08/02/2008    REFERRING PROVIDER: Vanetta Mulders, MD  REFERRING DIAG:  M25.511,G89.29,M25.512 (ICD-10-CM) - Chronic pain of both shoulders  S76.012A (ICD-10-CM) - Tear of left gluteus medius tendon, initial encounter  B/L shoulder program ROM and strenghening  L hip strengthening program  THERAPY DIAG:  Abnormal posture  Muscle weakness (generalized)  Other muscle spasm  Rationale for Evaluation and Treatment Rehabilitation  ONSET DATE: most recently June 2023  SUBJECTIVE:   SUBJECTIVE STATEMENT: I do have a pinched nerve in my Rt wrist per NCV testing. Neck is really tight.   PERTINENT HISTORY: Sensory neuropathy, chronic pain, MDD, DDD  PAIN:  Are you having pain? Yes: NPRS scale: 7-shoulder, 3-hip/10 Pain location: bil shoulder, Lt hip Pain description: see subj Aggravating factors: see subj Relieving factors: see subj  PRECAUTIONS: Other: sensory neropathy Does not tolerate seated or supine positioning well.   WEIGHT BEARING RESTRICTIONS No  FALLS:  Has patient fallen in last 6 months? No  LIVING ENVIRONMENT: Lives with: lives with their family  OCCUPATION: not working  PLOF: Independent with  basic ADLs and Independent with community mobility without device  PATIENT GOALS decr pain, incr ability and stamina, out of the bed more, up steps, dressing    OBJECTIVE:   DIAGNOSTIC FINDINGS: bil shoulder xrays unremarkable on 12/06/21  MRI 03/30/22 IMPRESSION: 1. Mild tendinosis of the supraspinatus tendon with a tiny interstitial tear. 2. Mild tendinosis of the infraspinatus tendon. 3. Mild tendinosis of the intra-articular portion of the long head of the biceps tendon adjacent to the bicipital-labral complex.  PATIENT SURVEYS:  FOTO Hip 40, Shoulder 45 FOTO 04/04/22: hip 43, shoulder 48  COGNITION:  Overall cognitive status: Within functional limits for tasks assessed     SENSATION: Lt UE neuropathy-painful to touch, light sensation in Rt UE LLE neuropathy, both  feet have a little bit  POSTURE: 12/22 noted that pt stands at midline with frequent swaying equal Rt to Lt  PALPATION: 12/22: pt presents with gross hypersensitivity but still very tender around hip and pelvis.   LOWER EXTREMITY ROM:  Active ROM- in standing Right eval Left eval Rt/Lt 12/8  Hip flexion 78 74 82/70  Hip extension 14 20 40/34  Hip abduction 20 18 30/20   (Blank rows = not tested)    FUNCTIONAL TESTS:  6MWT 1033 ft    RPE: 8/10; pain up to 8/10 on both sides (Lt more than Rt)  GAIT: Distance walked: Rt trendelenburg- pt verbalizes sensation of instability Assistive device utilized: none at eval  AROM Right EVAL  Left EVAL Rt/Lt 12/8  Shoulder flexion 110 112 130/100  Shoulder extension 26 22 32/30  Shoulder abduction 108 100 120/80  Shoulder adduction     Shoulder internal rotation Below Sacrum mid buttock Just past neutral not behind body To ipsilateral sacral border/to midline of buttock  Shoulder external rotation t2 c5 T2/C4  (Blank rows = not tested)  UEROM: Unable to hook bra behind back Difficulty shaving under arms, brushing hair, crossing arms hurts, unable to  reach over either shoulder. *pt verbalized numbness in feet with end range overpressure in standing shoulder flexion   TODAY'S TREATMENT:  Treatment                            04/18/22:  Frozen water bottle massage to carpal tunnel and opponens group [place] massage to opponens Opponens stretching, wrist flexor stretch with elbow bent, finger to thumb and open Prone rib & thoracic mobs, STM to levators, upper traps; supine cervical distraction, suboccipital release, Lt cervical sidebend with Lt to Rt cervical mobs   Treatment                            04/11/22:  Supine STM to upper trap, levator, supra&infra spinatus; PROM flexion & abd ; AP joint mobs with movement through abd  Standing at wall, scap retraction, elevation/depression Pulleys flexion & scaption Door pec stretch Reviewed HEP & gentle progression   Treatment                            04/04/22:  Objective measures PROM shoulder & pec stretching; STM Lt suboccipitals Supine pec stretch and overhead flexion stretch with cane    PATIENT EDUCATION:  Education details: Anatomy of condition, POC, HEP, exercise form/rationale Person educated: Patient Education method: Explanation, Demonstration, Tactile cues, Verbal cues, and Handouts Education comprehension: verbalized understanding, returned demonstration, verbal cues required, tactile cues required, and needs further education   HOME EXERCISE PROGRAM: [id number]  ASSESSMENT:  CLINICAL IMPRESSION: Discussed stretches and exercises today to address carpal tunnel more specifically and she will work on these at home. Noted that she had very limited passive Lt cervical sidebend due to limited joint mobility- improved pain and motion (visually) following manual therapy today. POC extended and for a long period of time as there is a possibility we will be scheduling around a carpal tunnel surgical intervention.   OBJECTIVE IMPAIRMENTS decreased activity tolerance,  difficulty walking, decreased ROM, decreased strength, increased muscle spasms, impaired flexibility, impaired sensation, improper body mechanics, postural dysfunction, and pain.   ACTIVITY LIMITATIONS carrying, lifting, bending, sitting, standing, squatting, sleeping, stairs, transfers, bed mobility, bathing, toileting, dressing,  reach over head, hygiene/grooming, locomotion level, and caring for others  PARTICIPATION LIMITATIONS: meal prep, cleaning, laundry, driving, shopping, and community activity  PERSONAL FACTORS 3+ comorbidities: see PMH  are also affecting patient's functional outcome.     GOALS: Goals reviewed with patient? Yes  SHORT TERM GOALS: Target date: 02/21/2022  [facility] IR to midline bilaterally Baseline: Goal status: achieved when done passively, pt reported Lt AC joint pain  2.  Pt will verbalize awareness of equal WB and more frequent corrections Baseline:  Goal status: achieved  3.  Independent in HEP as it has been established in the short term Baseline:  Goal status: achieved   LONG TERM GOALS: Target date: POC date  Pt will meet FOTO goals for hip and shoulder Baseline:  Goal status: ongoing  2.  6 MWT to distance greater than 50-59 age normative values Baseline:  Goal status:not tested today  3.  Able to fix hair independently pain <=3/10 Baseline: severe pain still ongoing but I feel like I can move it better Goal status:ongoing  4.  Able to dress with minimal difficulty for shoulders and hip Baseline:  Goal status: ongoing  5.  Able to navigate stairs with reciprocal pattern Baseline:  Goal status: ongoing  6.  Able to tolerate ADLs without requiring long periods in her bed due to pain Baseline:  Goal status:ongoing   PLAN: PT FREQUENCY: 1-2x/week  PT DURATION: 10 weeks  PLANNED INTERVENTIONS: Therapeutic exercises, Therapeutic activity, Neuromuscular re-education, Balance training, Gait training, Patient/Family education, Self Care,  Joint mobilization, Stair training, Aquatic Therapy, Dry Needling, Electrical stimulation, Spinal mobilization, Cryotherapy, Moist heat, Taping, Manual therapy, and Re-evaluation  PLAN FOR NEXT SESSION: continue prone manual work, mobility as tolerated. Review goals that were not tested  Lubna Stegeman C. Ronell Duffus PT, DPT 04/18/22 3:03 PM

## 2022-04-18 ENCOUNTER — Ambulatory Visit (HOSPITAL_BASED_OUTPATIENT_CLINIC_OR_DEPARTMENT_OTHER): Payer: PPO | Admitting: Physical Therapy

## 2022-04-18 ENCOUNTER — Encounter (HOSPITAL_BASED_OUTPATIENT_CLINIC_OR_DEPARTMENT_OTHER): Payer: Self-pay | Admitting: Physical Therapy

## 2022-04-18 DIAGNOSIS — R293 Abnormal posture: Secondary | ICD-10-CM | POA: Diagnosis not present

## 2022-04-18 DIAGNOSIS — M62838 Other muscle spasm: Secondary | ICD-10-CM

## 2022-04-18 DIAGNOSIS — M6281 Muscle weakness (generalized): Secondary | ICD-10-CM

## 2022-04-19 NOTE — Procedures (Unsigned)
EMG & NCV Findings: Evaluation of the right median motor nerve showed prolonged distal onset latency (4.3 ms).  The right median (across palm) sensory nerve showed prolonged distal peak latency (Wrist, 4.1 ms).  All remaining nerves (as indicated in the following tables) were within normal limits.  Left vs. Right side comparison data for the ulnar motor nerve indicates abnormal L-R latency difference (0.7 ms) and abnormal L-R velocity difference (A Elbow-B Elbow, 29 m/s).  All remaining left vs. right side differences were within normal limits.    All examined muscles (as indicated in the following table) showed no evidence of electrical instability.    Impression: The above electrodiagnostic study is ABNORMAL and reveals evidence of a mild right median nerve entrapment at the wrist (carpal tunnel syndrome) affecting sensory components. There is no significant electrodiagnostic evidence of any other focal nerve entrapment, brachial plexopathy or cervical radiculopathy in either upper limb.   Recommendations: 1.  Follow-up with referring physician. 2.  Continue current management of symptoms. 3.  Continue use of resting splint at night-time and as needed during the day.  ___________________________ Katelyn [name] Board Certified, American Board of Physical Medicine and Rehabilitation    Nerve Conduction Studies Anti Sensory Summary Table   Stim Site NR Peak (ms) Norm Peak (ms) P-T Amp (V) Norm P-T Amp Site1 Site2 Delta-P (ms) Dist (cm) Vel (m/s) Norm Vel (m/s)  Left Median Acr Palm Anti Sensory (2nd Digit)  28.7C  Wrist    3.3 <3.6 26.2 >10 Wrist Palm 1.6 0.0    Palm    1.7 <2.0 71.4         Right Median Acr Palm Anti Sensory (2nd Digit)  28.6C  Wrist    *4.1 <3.6 23.0 >10 Wrist Palm 2.1 0.0    Palm    2.0 <2.0 21.7         Left Radial Anti Sensory (Base 1st Digit)  29.3C  Wrist    2.0 <3.1 22.6  Wrist Base 1st Digit 2.0 0.0    Right Radial Anti Sensory (Base 1st Digit)  28.9C   Wrist    1.9 <3.1 36.0  Wrist Base 1st Digit 1.9 0.0    Left Ulnar Anti Sensory (5th Digit)  29.3C  Wrist    3.1 <3.7 28.1 >15.0 Wrist 5th Digit 3.1 14.0 45 >38  Right Ulnar Anti Sensory (5th Digit)  29C  Wrist    2.9 <3.7 25.7 >15.0 Wrist 5th Digit 2.9 14.0 48 >38   Motor Summary Table   Stim Site NR Onset (ms) Norm Onset (ms) O-P Amp (mV) Norm O-P Amp Site1 Site2 Delta-0 (ms) Dist (cm) Vel (m/s) Norm Vel (m/s)  Left Median Motor (Abd Poll Brev)  29.7C  Wrist    3.8 <4.2 5.3 >5 Elbow Wrist 3.2 19.5 61 >50  Elbow    7.0  5.6         Right Median Motor (Abd Poll Brev)  29C  Wrist    *4.3 <4.2 8.0 >5 Elbow Wrist 3.4 18.0 53 >50  Elbow    7.7  7.9         Left Ulnar Motor (Abd Dig Min)  29.9C  Wrist    3.4 <4.2 9.0 >3 B Elbow Wrist 2.9 19.0 66 >53  B Elbow    6.3  11.7  A Elbow B Elbow 1.1 11.0 100 >53  A Elbow    7.4  11.7         Right Ulnar Motor (Abd  Dig Min)  29.2C  Wrist    2.7 <4.2 10.2 >3 B Elbow Wrist 3.0 20.0 67 >53  B Elbow    5.7  9.9  A Elbow B Elbow 1.4 10.0 71 >53  A Elbow    7.1  9.5          EMG   Side Muscle Nerve Root Ins Act Fibs Psw Amp Dur Poly Recrt Int Fraser Din Comment  Left Abd Poll Brev Median C8-T1 Nml Nml Nml Nml Nml 0 Nml Nml   Left 1stDorInt Ulnar C8-T1 Nml Nml Nml Nml Nml 0 Nml Nml   Left PronatorTeres Median C6-7 Nml Nml Nml Nml Nml 0 Nml Nml   Left Biceps Musculocut C5-6 Nml Nml Nml Nml Nml 0 Nml Nml   Left Deltoid Axillary C5-6 Nml Nml Nml Nml Nml 0 Nml Nml     Nerve Conduction Studies Anti Sensory Left/Right Comparison   Stim Site L Lat (ms) R Lat (ms) L-R Lat (ms) L Amp (V) R Amp (V) L-R Amp (%) Site1 Site2 L Vel (m/s) R Vel (m/s) L-R Vel (m/s)  Median Acr Palm Anti Sensory (2nd Digit)  28.7C  Wrist 3.3 *4.1 0.8 26.2 23.0 12.2 Wrist Palm     Palm 1.7 2.0 0.3 71.4 21.7 69.6       Radial Anti Sensory (Base 1st Digit)  29.3C  Wrist 2.0 1.9 0.1 22.6 36.0 37.2 Wrist Base 1st Digit     Ulnar Anti Sensory (5th Digit)  29.3C  Wrist 3.1 2.9  0.2 28.1 25.7 8.5 Wrist 5th Digit 45 48 3   Motor Left/Right Comparison   Stim Site L Lat (ms) R Lat (ms) L-R Lat (ms) L Amp (mV) R Amp (mV) L-R Amp (%) Site1 Site2 L Vel (m/s) R Vel (m/s) L-R Vel (m/s)  Median Motor (Abd Poll Brev)  29.7C  Wrist 3.8 *4.3 0.5 5.3 8.0 33.8 Elbow Wrist 61 53 8  Elbow 7.0 7.7 0.7 5.6 7.9 29.1       Ulnar Motor (Abd Dig Min)  29.9C  Wrist 3.4 2.7 *0.7 9.0 10.2 11.8 B Elbow Wrist 66 67 1  B Elbow 6.3 5.7 0.6 11.7 9.9 15.4 A Elbow B Elbow 100 71 *29  A Elbow 7.4 7.1 0.3 11.7 9.5 18.8          Waveforms:

## 2022-04-22 ENCOUNTER — Ambulatory Visit: Payer: PPO

## 2022-04-22 DIAGNOSIS — M62838 Other muscle spasm: Secondary | ICD-10-CM

## 2022-04-22 DIAGNOSIS — R279 Unspecified lack of coordination: Secondary | ICD-10-CM

## 2022-04-22 DIAGNOSIS — M6281 Muscle weakness (generalized): Secondary | ICD-10-CM

## 2022-04-22 DIAGNOSIS — R293 Abnormal posture: Secondary | ICD-10-CM

## 2022-04-22 NOTE — Therapy (Signed)
OUTPATIENT PHYSICAL THERAPY TREATMENT NOTE Progress Note  Reporting Period 03/03/22 to 03/26/22  See note below for Objective Data and Assessment of Progress/Goals.    Patient Name: Katelyn [name] MRN: [id number] DOB:03-17-1976, 46 y.o., female Today's Date: 04/22/2022  PCP: Kathyrn Drown, MD  REFERRING PROVIDER: Vira Agar, MD  END OF SESSION:   PT End of Session - 04/22/22 0754     Visit Number 16    Date for PT Re-Evaluation 06/27/22    Authorization Type HTA    PT Start Time 0800    PT Stop Time 0845    PT Time Calculation (min) 45 min    Activity Tolerance Patient tolerated treatment well    Behavior During Therapy [facility] for tasks assessed/performed             Past Medical History:  Diagnosis Date   Anemia    Anorexia    Anxiety    Asthma    controlled with meds   Benign juvenile melanoma    Chronic headaches    Colon polyps    found on colonoscopy 64/40/3474   Complication of anesthesia    itching after epidural for c section   Constipation    Depression    Depression    several suicide attempts, hospitaluzed in 2012 for this; hx pf ECT treatments ; pt sees Dr. Adele Schilder pyschiatrist  and doing well on medication   Dysrhythmia    hx of PVC's (pt hasn't seen cardiologist since 2021- no follow up needed)   GERD (gastroesophageal reflux disease)    Headache    Heart murmur    as a child - no one mentions hearing murmur anymore, sometimes it is heard. Echo has been performed 02/03/18   Heartburn    no meds   History of pneumonia    x 3  years ago - no recent problems   Hx of blood clots    Left renal atrophy 12/19/2021   Neuromuscular disorder (HCC)    small fiber neuropathy   Obesity    Pneumonia    Polycystic ovary    takes metformin to treat   Renal atrophy, left 2023   Skin lesion    Excisional biopsy of moles - none cancerous   Sleep apnea    yrs ago - diagnosed mild sleep apnea - did not have to use cpap    Past Surgical History:   Procedure Laterality Date   BREAST CAPSULECTOMY Bilateral 12/13/2020   1 week later pt had hematoma surgery on left breast   BREAST ENHANCEMENT SURGERY Bilateral 02/11/2017   BREATH TEK H PYLORI N/A 07/22/2013   Procedure: BREATH TEK H PYLORI;  Surgeon: Edward Jolly, MD;  Location: [facility];  Service: General;  Laterality: N/A;   CESAREAN SECTION  2004, 2007   x 2   COLONOSCOPY  2013   CYSTOSCOPY WITH URETHRAL DILATATION  age 89    GASTRIC ROUX-EN-Y N/A 10/31/2013   Procedure: LAPAROSCOPIC ROUX-EN-Y GASTRIC BYPASS WITH UPPER ENDOSCOPY;  Surgeon: Edward Jolly, MD;  Location: [facility];  Service: General;  Laterality: N/A;   GLUTEUS MINIMUS REPAIR Left 05/28/2021   Procedure: Left [place];  Surgeon: Vanetta Mulders, MD;  Location: [facility];  Service: Orthopedics;  Laterality: Left;   ingrown toenail Right    Ingrown nail on great right toe   kidney stent  08/28/2014   mole excision     "benign juvenile melanoma" removed from left leg - inner thigh  POLYPECTOMY     2013   SKIN LESION EXCISION     back   TONSILLECTOMY  age 37   TUBAL LIGATION  2018   Patient Active Problem List   Diagnosis Date Noted   Left renal atrophy 12/19/2021   Stage 2 chronic kidney disease 11/20/2021   Meningitis 09/27/2021   Epigastric pain 09/27/2021   GERD (gastroesophageal reflux disease) 09/27/2021   Rash and nonspecific skin eruption 09/27/2021   Hypocalcemia 09/27/2021   Chronic pain syndrome 09/27/2021   Sepsis ([place])    Fever 09/25/2021   Tear of left gluteus medius tendon    Asthma 07/18/2020   Vitamin B 12 deficiency 10/16/2019   Elevated LFTs 10/16/2019   Asthma, exogenous, unspecified asthma severity, uncomplicated 83/15/1761   B12 deficiency anemia 06/21/2018   Venous insufficiency 04/23/2018   Onychomycosis of toenail 04/17/2018   Chronic myofascial pain 03/18/2018   Small fiber neuropathy 03/18/2018   Idiopathic small fiber sensory neuropathy 02/23/2018    Varicose veins of both lower extremities 01/13/2018   Cervical spondylosis without myelopathy 08/28/2017   Migraine with aura and without status migrainosus, not intractable 07/22/2017   Lumbar degenerative disc disease 07/20/2017   Spondylosis of lumbar spine 07/20/2017   Coccydynia 03/12/2017   Bariatric surgery status 05/02/2016   Major depressive disorder, recurrent episode, moderate ([place]) 02/18/2016   Tachy-brady syndrome ([place]) 12/19/2015   Iron deficiency anemia 06/10/2015   Sleep disorder 06/01/2013   Generalized anxiety disorder 05/30/2013   Chronic headaches 07/01/2012   Headache 07/01/2012   Depression, major, recurrent, severe with psychosis ([place]) 06/17/2012   Severe recurrent major depressive disorder with psychotic symptoms ([place]) 06/17/2012   Family history of colonic polyps 03/03/2012   Eating disorder 12/18/2011   Nausea & vomiting 01/22/2010   Polycystic ovarian syndrome 09/01/2008   Intermittent left-sided chest pain 08/02/2008    REFERRING DIAG: M25.511,G89.29,M25.512 (ICD-10-CM) - Chronic pain of both shoulders S76.012A (ICD-10-CM) - Tear of left gluteus medius tendon, initial encounter  THERAPY DIAG:  Abnormal posture  Muscle weakness (generalized)  Other muscle spasm  Unspecified lack of coordination  Rationale for Evaluation and Treatment Rehabilitation  PERTINENT HISTORY: Breast augmentation surgeries with follow-up reduction with complication, c/s, gastric bypass, bil tubal ligation, long history of chronic low back/pelvic pain, pudendal neuralgia, coccydynia, Lt glute med repair  PRECAUTIONS: NA  SUBJECTIVE:                                                                                                                                                                                      SUBJECTIVE STATEMENT:  Pt states that labial pain continues to be worse. Wearing underwear and sitting makes  it worse. She does feels like additional stretches for  this has helped.   PAIN:  Are you having pain? Yes, 6/10, Lt hip and low back   03/03/22 SUBJECTIVE STATEMENT: Pt had surgery for Lt glute med tear that most likely happened during fall 3 years ago. She has still had pain in Lt hip, but she is in rehab due to it. She had meningitis in June and ended up having Bil frozen shoulders. Her biggest complaint is of back pain, which impacts her bil shoulder pain because she has to lay on either side. She mostly lies on Rt side, which causes pain in Rt low back and Rt hip. She is rarely getting pressure off of buttocks. She continues to have sacral/coccydynia with sitting and avoids this position. She is not currently having vaginal/pelvic pain, but states this is coming and going - she manages by decreasing frequency of intercourse - no 1x/week. She is having issue with Lt kidney, but MD states that urinary issues are not related.  Is currently in PT for hip and shoulders - primary working on shoulders. Is only doing some basic leg strengthening and shoulder exercises at home.  Fluid intake: Yes: -     PAIN:  Are you having pain? Yes NPRS scale: 8/10Right and Left Pain location:    Pain type: sharp and pressure Pain description: constant    Aggravating factors: sitting, driving, finding comfortable position Relieving factors: lying on stomach relieves buttock pain if she can, but shoulders prevent this   PRECAUTIONS: None   WEIGHT BEARING RESTRICTIONS: No   FALLS:  Has patient fallen in last 6 months? No   LIVING ENVIRONMENT: Lives with: lives with their family Lives in: House/apartment   OCCUPATION: disabled   PLOF: Independent   PATIENT GOALS: would like to be able to sit; decrease bil hip/low back pain   PERTINENT HISTORY:  Breast augmentation surgeries with follow-up reduction with complication, c/s, gastric bypass, bil tubal ligation, long history of chronic low back/pelvic pain, pudendal neuralgia, coccydynia, Lt glute med  repair Sexual abuse: No   BOWEL MOVEMENT: Pain with bowel movement: No Type of bowel movement:Frequency 1x/day and Strain Yes Fully empty rectum: Yes: - Leakage: No Pads: No Fiber supplement: No no medication to help stimulate bowel movements   URINATION: Pain with urination: No Fully empty bladder: No Stream:  trouble starting the stream Urgency: no Frequency: every 30 minutes to an hour - worse once she does go, then she has to keep going - easier to just hold it Leakage:  none Pads: No   INTERCOURSE: Pain with intercourse: Initial Penetration and During Penetration Ability to have vaginal penetration:  Yes: pain - only tolerates 1x/week Climax: yes Marinoff Scale: 2/3   PREGNANCY: Vaginal deliveries 0 Tearing No C-section deliveries 2 Currently pregnant No       OBJECTIVE:  03/03/22: DIAGNOSTIC FINDINGS:  Lasix renogram with no abnormal findings   PATIENT SURVEYS:    PFIQ-7 100%   COGNITION: Overall cognitive status: Within functional limits for tasks assessed                          SENSATION: Light touch: Appears intact Proprioception: Appears intact   MUSCLE LENGTH:     FUNCTIONAL TESTS:              5xSTS: 29 seconds   GAIT:   Comments: decreased bil hip extension, posterior pelvic tilt   POSTURE: rounded shoulders, forward  head, increased lumbar lordosis, and posterior pelvic tilt   PELVIC ALIGNMENT:   LUMBARAROM/PROM:   A/PROM A/PROM  Eval (%)  Flexion 50, pain on Rt  Extension 20, no pain, very limited mobility  Right lateral flexion 50, pain in Rt low back  Left lateral flexion 75, pain in Rt low back  Right rotation 50, good stretch  Left rotation 50, good stretch   (Blank rows = not tested)     LOWER EXTREMITY MMT: tested in standing due to comfort             Rt: 3/5 hip abduction and extension, 4/5 adduction and flexion             Lt: 3/5 hip abduction, extension, flexion, 4/5 adduction     PALPATION:   General   tenderness throughout bil lumbar paraspinals and gluts, Lt>Rt; tenderness and spasm in thoracic paraspinals with decreased mobility upon deep breathing; scar tissue restriction under bil breasts that is tender; scar tissue restriction Lt glut med surgical incision                 External Perineal Exam NA                             Internal Pelvic Floor NA   Patient confirms identification and approves PT to assess internal pelvic floor and treatment Yes for future need   PELVIC MMT:   MMT eval  Vaginal    Internal Anal Sphincter    External Anal Sphincter    Puborectalis    Diastasis Recti    (Blank rows = not tested)         TONE: NA   PROLAPSE: NA   TODAY'S  Manual: Soft tissue mobilization to posterolateral hip muscles Scar tissue mobilization to Lt hip incision and surrounding area External tailbone muscle attachment release Internal vaginal deep pelvic floor release bil No emotional/communication barriers or cognitive limitation. Patient is motivated to learn. Patient understands and agrees with treatment goals and plan. PT explains patient will be examined in standing, sitting, and lying down to see how their muscles and joints work. When they are ready, they will be asked to remove their underwear so PT can examine their perineum. The patient is also given the option of providing their own chaperone as one is not provided in our facility. The patient also has the right and is explained the right to defer or refuse any part of the evaluation or treatment including the internal exam. With the patient's consent, PT will use one gloved finger to gently assess the muscles of the pelvic floor, seeing how well it contracts and relaxes and if there is muscle symmetry. After, the patient will get dressed and PT and patient will discuss exam findings and plan of care. PT and patient discuss plan of care, schedule, attendance policy and HEP activities. External perineal generalized  release on Lt, pressing caudally with exhales Soft tissue mobilization to Lt adductors Exercises: Press wall extensions Standing lumbar extensions with support  TREATMENT 04/14/22: Manual: Negative pressure soft tissue mobilization to Lt hip, glutes, sacral/coccygeal border Soft tissue mobilization to posterolateral hip muscles Scar tissue mobilization to Lt hip incision and surrounding area Neuro: Cat cow on table  Modified child's pose gliding on table Forward hold hang  TREATMENT 03/26/22: Manual: Soft tissue mobilization: Lt hip, bil lumbar paraspinals Scar tissue mobilization: Lt hip incision - negative pressure soft tissue mobilization  Lt breast incision Neuromuscular re-education: Core retraining:  Core facilitation: Form correction: Pelvic floor contraction training: Down training: Exercises: Stretches/mobility: Strengthening: Therapeutic activities: Functional strengthening activities: Self-care: We discussed HEP and decided to remain at current program due to lack of performance several days because of the holiday    PATIENT EDUCATION:  Education details: see above self-care Person educated: Patient Education method: Explanation, Demonstration, Tactile cues, Verbal cues, and Handouts Education comprehension: verbalized understanding   HOME EXERCISE PROGRAM: Written handout   ASSESSMENT:   CLINICAL IMPRESSION: Pt had less Lt hip pain after last session. With increased labial/vulvar pain, internal vaginal pelvic floor release performed to deep muscle layers. She demonstrated good release with less restriction surrounding pudendal nerve. With increased adductor restriction on LT, soft tissue mobilization performed to help reduce. Manual techniques previously performed to Lt posterolateral hip and incision repeated this session due to benefit. She will continue to benefit from skilled PT intervention in order to decrease pain, improve functional ability,  increase ease of starting stream/emptying bladder, and improve QOL.    OBJECTIVE IMPAIRMENTS: decreased activity tolerance, decreased coordination, decreased endurance, decreased mobility, decreased ROM, decreased strength, hypomobility, increased fascial restrictions, increased muscle spasms, impaired flexibility, impaired tone, postural dysfunction, and pain.    ACTIVITY LIMITATIONS: bending, sitting, standing, stairs, transfers, bed mobility, and locomotion level   PARTICIPATION LIMITATIONS: cleaning, laundry, interpersonal relationship, driving, and community activity   PERSONAL FACTORS: 3+ comorbidities: Breast augmentation surgeries with follow-up reduction with complication, c/s, gastric bypass, bil tubal ligation, long history of chronic low back/pelvic pain, pudendal neuralgia, coccydynia, Lt glut med repair  are also affecting patient's functional outcome.    REHAB POTENTIAL: Good   CLINICAL DECISION MAKING: Stable/uncomplicated   EVALUATION COMPLEXITY: Low     GOALS: Goals reviewed with patient? Yes   SHORT TERM GOALS: Target date: 03/31/2022 - updated 03/26/22   Pt will be independent with HEP.    Baseline: Goal status: MET 03/26/22   2.  Pt will be independent with diaphragmatic breathing and down training activities in order to improve pelvic floor relaxation and abdominal wall mobility.   Baseline:  Goal status: IN PROGRESS   3.  Pt will report improve stream initiation and complete voiding with urination in order to improve QOL. Baseline:  Goal status: IN PROGRESS              4.  Pt will increase all impaired lumbar A/ROM by 25% without pain.   Baseline:  Goal status: IN PROGRESS   LONG TERM GOALS: Target date: 04/28/2022 - updated 03/26/22   Pt will be independent with advanced HEP.    Baseline:  Goal status:IN PROGRESS   2.  Pt will be able to use regular toilet without difficulty. Baseline: Using high toilet chair Goal status: IN PROGRESS   3.  Pt  will be able to ascend/descend 12 steps at regular intervals throughout the day safely and with minimal difficulty.  Baseline:  Goal status: IN PROGRESS   4.  Pt will improve 5xSTS to less than 20 seconds and improved PFIQ by 25% in order to demonstrate improved QOL and functional ability.  Baseline: 28 seconds Goal status: IN PROGRESS   5.  Patient will report no greater than 5/10 pain with sitting in order to more easily attend appointments, drive, and spend time with family.  Baseline:  Goal status:IN PROGRESS   6.  Pt will demonstrate increase in all impaired hip strength by 1 muscle grades in order to demonstrate improved  lumbopelvic support and increase functional ability.      Baseline:  Goal status: IN PROGRESS   PLAN:   PT FREQUENCY: 1x/week   PT DURATION: 8 weeks   PLANNED INTERVENTIONS: Therapeutic exercises, Therapeutic activity, Neuromuscular re-education, Balance training, Gait training, Patient/Family education, Self Care, Joint mobilization, Aquatic Therapy, Dry Needling, Biofeedback, and Manual therapy   PLAN FOR NEXT SESSION: Address goals; continue manual techniques as needed; progress mobility exercises.    Heather Roberts, PT, DPT12/26/239:11 AM

## 2022-04-23 NOTE — Progress Notes (Signed)
Katelyn [name] - 46 y.o. female MRN [id number]  Date of birth: December 21, 1975  Office Visit Note: Visit Date: 04/16/2022 PCP: Kathyrn Drown, MD Referred by: Vanetta Mulders, MD  Subjective: Chief Complaint  Patient presents with   Left Wrist - Pain   Right Wrist - Pain   Right Hand - Numbness   Left Knee - Numbness   HPI:  Katelyn [name] is a 46 y.o. female who comes in today at the request of Dr. Vanetta Mulders for evaluation and management of the Bilateral upper extremities.  Patient is Right hand dominant.  She has been followed by bilateral adhesive capsulitis of both shoulders.  She has had right more than left pain numbness and tingling and more of a radial digit distribution.  She can have somewhat global symptoms as well.  No prior electrodiagnostic studies.  No frank radicular symptoms.   ROS Otherwise per HPI.  Assessment & Plan: Visit Diagnoses:    ICD-10-CM   1. Paresthesia of skin  R20.2 NCV with EMG (electromyography)      Plan: Impression: The above electrodiagnostic study is ABNORMAL and reveals evidence of a mild right median nerve entrapment at the wrist (carpal tunnel syndrome) affecting sensory components.   There is no significant electrodiagnostic evidence of any other focal nerve entrapment, brachial plexopathy or cervical radiculopathy in either upper limb.   Recommendations: 1.  Follow-up with referring physician. 2.  Continue current management of symptoms. 3.  Continue use of resting splint at night-time and as needed during the day.  Meds & Orders: No orders of the defined types were placed in this encounter.   Orders Placed This Encounter  Procedures   NCV with EMG (electromyography)    Follow-up: Return in about 2 weeks (around 04/30/2022) for Vanetta Mulders, MD.   Procedures: No procedures performed  EMG & NCV Findings: Evaluation of the right median motor nerve showed prolonged distal onset latency (4.3 ms).  The right median  (across palm) sensory nerve showed prolonged distal peak latency (Wrist, 4.1 ms).  All remaining nerves (as indicated in the following tables) were within normal limits.  Left vs. Right side comparison data for the ulnar motor nerve indicates abnormal L-R latency difference (0.7 ms) and abnormal L-R velocity difference (A Elbow-B Elbow, 29 m/s).  All remaining left vs. right side differences were within normal limits.    All examined muscles (as indicated in the following table) showed no evidence of electrical instability.    Impression: The above electrodiagnostic study is ABNORMAL and reveals evidence of a mild right median nerve entrapment at the wrist (carpal tunnel syndrome) affecting sensory components.   There is no significant electrodiagnostic evidence of any other focal nerve entrapment, brachial plexopathy or cervical radiculopathy in either upper limb.   Recommendations: 1.  Follow-up with referring physician. 2.  Continue current management of symptoms. 3.  Continue use of resting splint at night-time and as needed during the day.  ___________________________ Wonda [employer]s Board Certified, American Board of Physical Medicine and Rehabilitation    Nerve Conduction Studies Anti Sensory Summary Table   Stim Site NR Peak (ms) Norm Peak (ms) P-T Amp (V) Norm P-T Amp Site1 Site2 Delta-P (ms) Dist (cm) Vel (m/s) Norm Vel (m/s)  Left Median Acr Palm Anti Sensory (2nd Digit)  28.7C  Wrist    3.3 <3.6 26.2 >10 Wrist Palm 1.6 0.0    Palm    1.7 <2.0 71.4  Right Median Acr Palm Anti Sensory (2nd Digit)  28.6C  Wrist    *4.1 <3.6 23.0 >10 Wrist Palm 2.1 0.0    Palm    2.0 <2.0 21.7         Left Radial Anti Sensory (Base 1st Digit)  29.3C  Wrist    2.0 <3.1 22.6  Wrist Base 1st Digit 2.0 0.0    Right Radial Anti Sensory (Base 1st Digit)  28.9C  Wrist    1.9 <3.1 36.0  Wrist Base 1st Digit 1.9 0.0    Left Ulnar Anti Sensory (5th Digit)  29.3C  Wrist    3.1 <3.7  28.1 >15.0 Wrist 5th Digit 3.1 14.0 45 >38  Right Ulnar Anti Sensory (5th Digit)  29C  Wrist    2.9 <3.7 25.7 >15.0 Wrist 5th Digit 2.9 14.0 48 >38   Motor Summary Table   Stim Site NR Onset (ms) Norm Onset (ms) O-P Amp (mV) Norm O-P Amp Site1 Site2 Delta-0 (ms) Dist (cm) Vel (m/s) Norm Vel (m/s)  Left Median Motor (Abd Poll Brev)  29.7C  Wrist    3.8 <4.2 5.3 >5 Elbow Wrist 3.2 19.5 61 >50  Elbow    7.0  5.6         Right Median Motor (Abd Poll Brev)  29C  Wrist    *4.3 <4.2 8.0 >5 Elbow Wrist 3.4 18.0 53 >50  Elbow    7.7  7.9         Left Ulnar Motor (Abd Dig Min)  29.9C  Wrist    3.4 <4.2 9.0 >3 B Elbow Wrist 2.9 19.0 66 >53  B Elbow    6.3  11.7  A Elbow B Elbow 1.1 11.0 100 >53  A Elbow    7.4  11.7         Right Ulnar Motor (Abd Dig Min)  29.2C  Wrist    2.7 <4.2 10.2 >3 B Elbow Wrist 3.0 20.0 67 >53  B Elbow    5.7  9.9  A Elbow B Elbow 1.4 10.0 71 >53  A Elbow    7.1  9.5          EMG   Side Muscle Nerve Root Ins Act Fibs Psw Amp Dur Poly Recrt Int Fraser Din Comment  Left Abd Poll Brev Median C8-T1 Nml Nml Nml Nml Nml 0 Nml Nml   Left 1stDorInt Ulnar C8-T1 Nml Nml Nml Nml Nml 0 Nml Nml   Left PronatorTeres Median C6-7 Nml Nml Nml Nml Nml 0 Nml Nml   Left Biceps Musculocut C5-6 Nml Nml Nml Nml Nml 0 Nml Nml   Left Deltoid Axillary C5-6 Nml Nml Nml Nml Nml 0 Nml Nml     Nerve Conduction Studies Anti Sensory Left/Right Comparison   Stim Site L Lat (ms) R Lat (ms) L-R Lat (ms) L Amp (V) R Amp (V) L-R Amp (%) Site1 Site2 L Vel (m/s) R Vel (m/s) L-R Vel (m/s)  Median Acr Palm Anti Sensory (2nd Digit)  28.7C  Wrist 3.3 *4.1 0.8 26.2 23.0 12.2 Wrist Palm     Palm 1.7 2.0 0.3 71.4 21.7 69.6       Radial Anti Sensory (Base 1st Digit)  29.3C  Wrist 2.0 1.9 0.1 22.6 36.0 37.2 Wrist Base 1st Digit     Ulnar Anti Sensory (5th Digit)  29.3C  Wrist 3.1 2.9 0.2 28.1 25.7 8.5 Wrist 5th Digit 45 48 3   Motor Left/Right Comparison   Stim Site L  Lat (ms) R Lat (ms) L-R Lat (ms)  L Amp (mV) R Amp (mV) L-R Amp (%) Site1 Site2 L Vel (m/s) R Vel (m/s) L-R Vel (m/s)  Median Motor (Abd Poll Brev)  29.7C  Wrist 3.8 *4.3 0.5 5.3 8.0 33.8 Elbow Wrist 61 53 8  Elbow 7.0 7.7 0.7 5.6 7.9 29.1       Ulnar Motor (Abd Dig Min)  29.9C  Wrist 3.4 2.7 *0.7 9.0 10.2 11.8 B Elbow Wrist 66 67 1  B Elbow 6.3 5.7 0.6 11.7 9.9 15.4 A Elbow B Elbow 100 71 *29  A Elbow 7.4 7.1 0.3 11.7 9.5 18.8          Waveforms:                     Clinical History: No specialty comments available.     Objective:  VS:  HT:    WT:   BMI:     BP:   HR: bpm  TEMP: ( )  RESP:  Physical Exam Musculoskeletal:        General: No swelling, tenderness or deformity.     Comments: Inspection reveals no atrophy of the bilateral APB or FDI or hand intrinsics. There is no swelling, color changes, allodynia or dystrophic changes. There is 5 out of 5 strength in the bilateral wrist extension, finger abduction and long finger flexion. There is intact sensation to light touch in all dermatomal and peripheral nerve distributions.  There is a negative Phalen's test bilaterally. There is a negative Hoffmann's test bilaterally.  Skin:    General: Skin is warm and dry.     Findings: No erythema or rash.  Neurological:     General: No focal deficit present.     Mental Status: She is alert and oriented to person, place, and time.     Motor: No weakness or abnormal muscle tone.     Coordination: Coordination normal.  Psychiatric:        Mood and Affect: Mood normal.        Behavior: Behavior normal.      Imaging: No results found.

## 2022-04-25 ENCOUNTER — Encounter (HOSPITAL_BASED_OUTPATIENT_CLINIC_OR_DEPARTMENT_OTHER): Payer: Self-pay | Admitting: Physical Therapy

## 2022-04-25 ENCOUNTER — Ambulatory Visit (HOSPITAL_BASED_OUTPATIENT_CLINIC_OR_DEPARTMENT_OTHER): Payer: PPO | Admitting: Physical Therapy

## 2022-04-25 DIAGNOSIS — R293 Abnormal posture: Secondary | ICD-10-CM

## 2022-04-25 DIAGNOSIS — M6281 Muscle weakness (generalized): Secondary | ICD-10-CM

## 2022-04-25 DIAGNOSIS — M62838 Other muscle spasm: Secondary | ICD-10-CM

## 2022-04-25 NOTE — Therapy (Signed)
OUTPATIENT PHYSICAL THERAPY LOWER EXTREMITY TREATMENT   Patient Name: Katelyn [name] MRN: [id number] DOB:1975/09/26, 46 y.o., female Today's Date: 04/25/2022   PT End of Session - 04/25/22 1058     Visit Number 17    Date for PT Re-Evaluation 06/27/22    Authorization Type HTA    PT Start Time 1058    PT Stop Time 1138    PT Time Calculation (min) 40 min    Activity Tolerance Patient tolerated treatment well    Behavior During Therapy [facility] for tasks assessed/performed                     Past Medical History:  Diagnosis Date   Anemia    Anorexia    Anxiety    Asthma    controlled with meds   Benign juvenile melanoma    Chronic headaches    Colon polyps    found on colonoscopy 66/09/3014   Complication of anesthesia    itching after epidural for c section   Constipation    Depression    Depression    several suicide attempts, hospitaluzed in 2012 for this; hx pf ECT treatments ; pt sees Dr. Adele Schilder pyschiatrist  and doing well on medication   Dysrhythmia    hx of PVC's (pt hasn't seen cardiologist since 2021- no follow up needed)   GERD (gastroesophageal reflux disease)    Headache    Heart murmur    as a child - no one mentions hearing murmur anymore, sometimes it is heard. Echo has been performed 02/03/18   Heartburn    no meds   History of pneumonia    x 3  years ago - no recent problems   Hx of blood clots    Left renal atrophy 12/19/2021   Neuromuscular disorder (HCC)    small fiber neuropathy   Obesity    Pneumonia    Polycystic ovary    takes metformin to treat   Renal atrophy, left 2023   Skin lesion    Excisional biopsy of moles - none cancerous   Sleep apnea    yrs ago - diagnosed mild sleep apnea - did not have to use cpap    Past Surgical History:  Procedure Laterality Date   BREAST CAPSULECTOMY Bilateral 12/13/2020   1 week later pt had hematoma surgery on left breast   BREAST ENHANCEMENT SURGERY Bilateral 02/11/2017   BREATH  TEK H PYLORI N/A 07/22/2013   Procedure: BREATH TEK H PYLORI;  Surgeon: Edward Jolly, MD;  Location: [facility];  Service: General;  Laterality: N/A;   CESAREAN SECTION  2004, 2007   x 2   COLONOSCOPY  2013   CYSTOSCOPY WITH URETHRAL DILATATION  age 89    GASTRIC ROUX-EN-Y N/A 10/31/2013   Procedure: LAPAROSCOPIC ROUX-EN-Y GASTRIC BYPASS WITH UPPER ENDOSCOPY;  Surgeon: Edward Jolly, MD;  Location: [facility];  Service: General;  Laterality: N/A;   GLUTEUS MINIMUS REPAIR Left 05/28/2021   Procedure: Left [place];  Surgeon: Vanetta Mulders, MD;  Location: [place];  Service: Orthopedics;  Laterality: Left;   ingrown toenail Right    Ingrown nail on great right toe   kidney stent  08/28/2014   mole excision     "benign juvenile melanoma" removed from left leg - inner thigh   POLYPECTOMY     2013   SKIN LESION EXCISION     back   TONSILLECTOMY  age 89   TUBAL LIGATION  2018   Patient Active Problem List   Diagnosis Date Noted   Left renal atrophy 12/19/2021   Stage 2 chronic kidney disease 11/20/2021   Meningitis 09/27/2021   Epigastric pain 09/27/2021   GERD (gastroesophageal reflux disease) 09/27/2021   Rash and nonspecific skin eruption 09/27/2021   Hypocalcemia 09/27/2021   Chronic pain syndrome 09/27/2021   Sepsis ([place])    Fever 09/25/2021   Tear of left gluteus medius tendon    Asthma 07/18/2020   Vitamin B 12 deficiency 10/16/2019   Elevated LFTs 10/16/2019   Asthma, exogenous, unspecified asthma severity, uncomplicated 57/84/6962   B12 deficiency anemia 06/21/2018   Venous insufficiency 04/23/2018   Onychomycosis of toenail 04/17/2018   Chronic myofascial pain 03/18/2018   Small fiber neuropathy 03/18/2018   Idiopathic small fiber sensory neuropathy 02/23/2018   Varicose veins of both lower extremities 01/13/2018   Cervical spondylosis without myelopathy 08/28/2017   Migraine with aura and without status migrainosus, not intractable 07/22/2017    Lumbar degenerative disc disease 07/20/2017   Spondylosis of lumbar spine 07/20/2017   Coccydynia 03/12/2017   Bariatric surgery status 05/02/2016   Major depressive disorder, recurrent episode, moderate ([place]) 02/18/2016   Tachy-brady syndrome ([place]) 12/19/2015   Iron deficiency anemia 06/10/2015   Sleep disorder 06/01/2013   Generalized anxiety disorder 05/30/2013   Chronic headaches 07/01/2012   Headache 07/01/2012   Depression, major, recurrent, severe with psychosis ([place]) 06/17/2012   Severe recurrent major depressive disorder with psychotic symptoms ([place]) 06/17/2012   Family history of colonic polyps 03/03/2012   Eating disorder 12/18/2011   Nausea & vomiting 01/22/2010   Polycystic ovarian syndrome 09/01/2008   Intermittent left-sided chest pain 08/02/2008    REFERRING PROVIDER: Vanetta Mulders, MD  REFERRING DIAG:  M25.511,G89.29,M25.512 (ICD-10-CM) - Chronic pain of both shoulders  S76.012A (ICD-10-CM) - Tear of left gluteus medius tendon, initial encounter  B/L shoulder program ROM and strenghening  L hip strengthening program  THERAPY DIAG:  Abnormal posture  Muscle weakness (generalized)  Other muscle spasm  Rationale for Evaluation and Treatment Rehabilitation  ONSET DATE: most recently June 2023  SUBJECTIVE:   SUBJECTIVE STATEMENT: Neck/shoulders are a little bit better after a short term increase.   PERTINENT HISTORY: Sensory neuropathy, chronic pain, MDD, DDD  PAIN:  Are you having pain? Yes: NPRS scale: 7-shoulder, 3-hip/10 Pain location: bil shoulder, Lt hip Pain description: see subj Aggravating factors: see subj Relieving factors: see subj  PRECAUTIONS: Other: sensory neropathy Does not tolerate seated or supine positioning well.   WEIGHT BEARING RESTRICTIONS No  FALLS:  Has patient fallen in last 6 months? No  LIVING ENVIRONMENT: Lives with: lives with their family  OCCUPATION: not working  PLOF: Independent with basic ADLs and  Independent with community mobility without device  PATIENT GOALS decr pain, incr ability and stamina, out of the bed more, up steps, dressing    OBJECTIVE:   DIAGNOSTIC FINDINGS: bil shoulder xrays unremarkable on 12/06/21  MRI 03/30/22 IMPRESSION: 1. Mild tendinosis of the supraspinatus tendon with a tiny interstitial tear. 2. Mild tendinosis of the infraspinatus tendon. 3. Mild tendinosis of the intra-articular portion of the long head of the biceps tendon adjacent to the bicipital-labral complex.  PATIENT SURVEYS:  FOTO Hip 40, Shoulder 45 FOTO 04/04/22: hip 43, shoulder 48  COGNITION:  Overall cognitive status: Within functional limits for tasks assessed     SENSATION: Lt UE neuropathy-painful to touch, light sensation in Rt UE LLE neuropathy, both feet have a little bit  POSTURE: 12/22 noted that pt stands at midline with frequent swaying equal Rt to Lt  PALPATION: 12/22: pt presents with gross hypersensitivity but still very tender around hip and pelvis.   LOWER EXTREMITY ROM:  Active ROM- in standing Right eval Left eval Rt/Lt 12/8  Hip flexion 78 74 82/70  Hip extension 14 20 40/34  Hip abduction 20 18 30/20   (Blank rows = not tested)    FUNCTIONAL TESTS:  6MWT 1033 ft    RPE: 8/10; pain up to 8/10 on both sides (Lt more than Rt)  12/29 998 ft   RPE 6/10; pain up to 6/10- mostly on left side (just a little on right)  GAIT: Distance walked: Rt trendelenburg- pt verbalizes sensation of instability Assistive device utilized: none at eval  AROM Right EVAL  Left EVAL Rt/Lt 12/8  Shoulder flexion 110 112 130/100  Shoulder extension 26 22 32/30  Shoulder abduction 108 100 120/80  Shoulder adduction     Shoulder internal rotation Below Sacrum mid buttock Just past neutral not behind body To ipsilateral sacral border/to midline of buttock  Shoulder external rotation t2 c5 T2/C4  (Blank rows = not tested)  UEROM: Unable to hook bra behind  back Difficulty shaving under arms, brushing hair, crossing arms hurts, unable to reach over either shoulder. *pt verbalized numbness in feet with end range overpressure in standing shoulder flexion   TODAY'S TREATMENT:  Treatment                            04/25/22:  Trigger Point Dry Needling, Manual Therapy Treatment:  Initial or subsequent education regarding Trigger Point Dry Needling: Initial Did patient give consent to treatment with Trigger Point Dry Needling: Yes TPDN with skilled palpation and monitoring followed by STM to the following muscles: Left upper trap  6 min walk  Manual prone rib mobs, scapular mobilzation on left side, STM at left periscapular region Prone scap retraction Prone deep breathing Yellow tband- triceps, ER, row, biceps curl   Treatment                            04/18/22:  Frozen water bottle massage to carpal tunnel and opponens group [place] massage to opponens Opponens stretching, wrist flexor stretch with elbow bent, finger to thumb and open Prone rib & thoracic mobs, STM to levators, upper traps; supine cervical distraction, suboccipital release, Lt cervical sidebend with Lt to Rt cervical mobs      PATIENT EDUCATION:  Education details: Geophysicist/field seismologist of condition, POC, HEP, exercise form/rationale Person educated: Patient Education method: Explanation, Demonstration, Tactile cues, Verbal cues, and Handouts Education comprehension: verbalized understanding, returned demonstration, verbal cues required, tactile cues required, and needs further education   HOME EXERCISE PROGRAM: [id number]  ASSESSMENT:  CLINICAL IMPRESSION: Tactile cues for scapular movement during resisted UE exercises. Seeing MD next week- reports the injection helped the shoulder but the symptoms made her too nervous. Good tolerance to DN and reported soreness as expected. While distance in 6MWT did not increase today, pain and difficulty report decreased significantly.    OBJECTIVE IMPAIRMENTS decreased activity tolerance, difficulty walking, decreased ROM, decreased strength, increased muscle spasms, impaired flexibility, impaired sensation, improper body mechanics, postural dysfunction, and pain.   ACTIVITY LIMITATIONS carrying, lifting, bending, sitting, standing, squatting, sleeping, stairs, transfers, bed mobility, bathing, toileting, dressing, reach over head, hygiene/grooming, locomotion level, and caring for others  PARTICIPATION LIMITATIONS:  meal prep, cleaning, laundry, driving, shopping, and community activity  PERSONAL FACTORS 3+ comorbidities: see PMH  are also affecting patient's functional outcome.     GOALS: Goals reviewed with patient? Yes  SHORT TERM GOALS: Target date: 02/21/2022  GHJ IR to midline bilaterally Baseline: Goal status: achieved when done passively, pt reported Lt AC joint pain  2.  Pt will verbalize awareness of equal WB and more frequent corrections Baseline:  Goal status: achieved  3.  Independent in HEP as it has been established in the short term Baseline:  Goal status: achieved   LONG TERM GOALS: Target date: POC date  Pt will meet FOTO goals for hip and shoulder Baseline:  Goal status: ongoing  2.  6 MWT to distance greater than 50-59 age normative values Baseline: 68' on 12/29 Goal status:  3.  Able to fix hair independently pain <=3/10 Baseline: severe pain still ongoing but I feel like I can move it better Goal status:ongoing  4.  Able to dress with minimal difficulty for shoulders and hip Baseline:  Goal status: ongoing  5.  Able to navigate stairs with reciprocal pattern Baseline:  Goal status: ongoing  6.  Able to tolerate ADLs without requiring long periods in her bed due to pain Baseline:  Goal status:ongoing   PLAN: PT FREQUENCY: 1-2x/week  PT DURATION: 10 weeks  PLANNED INTERVENTIONS: Therapeutic exercises, Therapeutic activity, Neuromuscular re-education, Balance  training, Gait training, Patient/Family education, Self Care, Joint mobilization, Stair training, Aquatic Therapy, Dry Needling, Electrical stimulation, Spinal mobilization, Cryotherapy, Moist heat, Taping, Manual therapy, and Re-evaluation  PLAN FOR NEXT SESSION: continue prone manual work, mobility as tolerated.   Brewster Wolters C. Chloris Marcoux PT, DPT 04/25/22 11:40 AM

## 2022-04-30 ENCOUNTER — Ambulatory Visit (INDEPENDENT_AMBULATORY_CARE_PROVIDER_SITE_OTHER): Payer: PPO | Admitting: Orthopaedic Surgery

## 2022-04-30 DIAGNOSIS — M7502 Adhesive capsulitis of left shoulder: Secondary | ICD-10-CM

## 2022-04-30 DIAGNOSIS — M7500 Adhesive capsulitis of unspecified shoulder: Secondary | ICD-10-CM
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
## 2022-05-01 DIAGNOSIS — F331 Major depressive disorder, recurrent, moderate: Secondary | ICD-10-CM | POA: Diagnosis not present

## 2022-05-01 DIAGNOSIS — F5001 Anorexia nervosa, restricting type: Secondary | ICD-10-CM | POA: Diagnosis not present

## 2022-05-01 DIAGNOSIS — F411 Generalized anxiety disorder: Secondary | ICD-10-CM | POA: Diagnosis not present

## 2022-05-02 DIAGNOSIS — M12812 Other specific arthropathies, not elsewhere classified, left shoulder: Secondary | ICD-10-CM | POA: Diagnosis not present

## 2022-05-02 DIAGNOSIS — Z79891 Long term (current) use of opiate analgesic: Secondary | ICD-10-CM | POA: Diagnosis not present

## 2022-05-02 DIAGNOSIS — M533 Sacrococcygeal disorders, not elsewhere classified: Secondary | ICD-10-CM | POA: Diagnosis not present

## 2022-05-02 DIAGNOSIS — M47816 Spondylosis without myelopathy or radiculopathy, lumbar region: Secondary | ICD-10-CM | POA: Diagnosis not present

## 2022-05-02 DIAGNOSIS — G894 Chronic pain syndrome: Secondary | ICD-10-CM | POA: Diagnosis not present

## 2022-05-06 DIAGNOSIS — F331 Major depressive disorder, recurrent, moderate: Secondary | ICD-10-CM | POA: Diagnosis not present

## 2022-05-06 DIAGNOSIS — F5001 Anorexia nervosa, restricting type: Secondary | ICD-10-CM | POA: Diagnosis not present

## 2022-05-06 DIAGNOSIS — F411 Generalized anxiety disorder: Secondary | ICD-10-CM | POA: Diagnosis not present

## 2022-05-07 ENCOUNTER — Ambulatory Visit: Payer: PPO

## 2022-05-09 ENCOUNTER — Encounter (HOSPITAL_BASED_OUTPATIENT_CLINIC_OR_DEPARTMENT_OTHER): Payer: Self-pay | Admitting: Physical Therapy

## 2022-05-09 ENCOUNTER — Ambulatory Visit (HOSPITAL_BASED_OUTPATIENT_CLINIC_OR_DEPARTMENT_OTHER): Payer: PPO | Attending: Orthopaedic Surgery | Admitting: Physical Therapy

## 2022-05-09 DIAGNOSIS — M62838 Other muscle spasm: Secondary | ICD-10-CM | POA: Diagnosis not present

## 2022-05-09 DIAGNOSIS — R293 Abnormal posture: Secondary | ICD-10-CM | POA: Diagnosis not present

## 2022-05-09 DIAGNOSIS — M6281 Muscle weakness (generalized): Secondary | ICD-10-CM | POA: Insufficient documentation

## 2022-05-09 NOTE — Therapy (Signed)
OUTPATIENT PHYSICAL THERAPY LOWER EXTREMITY TREATMENT   Patient Name: Katelyn [name] MRN: [id number] DOB:[birth year], 47 y.o., female Today's Date: 05/09/2022   PT End of Session - 05/09/22 1012     Visit Number 18    Date for PT Re-Evaluation 06/27/22    Authorization Type HTA    PT Start Time 1012    PT Stop Time 1050    PT Time Calculation (min) 38 min    Activity Tolerance Patient tolerated treatment well    Behavior During Therapy [facility] for tasks assessed/performed                     Past Medical History:  Diagnosis Date   Anemia    Anorexia    Anxiety    Asthma    controlled with meds   Benign juvenile melanoma    Chronic headaches    Colon polyps    found on colonoscopy 52/77/8242   Complication of anesthesia    itching after epidural for c section   Constipation    Depression    Depression    several suicide attempts, hospitaluzed in 2012 for this; hx pf ECT treatments ; pt sees Dr. Adele Schilder pyschiatrist  and doing well on medication   Dysrhythmia    hx of PVC's (pt hasn't seen cardiologist since 2021- no follow up needed)   GERD (gastroesophageal reflux disease)    Headache    Heart murmur    as a child - no one mentions hearing murmur anymore, sometimes it is heard. Echo has been performed 02/03/18   Heartburn    no meds   History of pneumonia    x 3  years ago - no recent problems   Hx of blood clots    Left renal atrophy 12/19/2021   Neuromuscular disorder (HCC)    small fiber neuropathy   Obesity    Pneumonia    Polycystic ovary    takes metformin to treat   Renal atrophy, left 2023   Skin lesion    Excisional biopsy of moles - none cancerous   Sleep apnea    yrs ago - diagnosed mild sleep apnea - did not have to use cpap    Past Surgical History:  Procedure Laterality Date   BREAST CAPSULECTOMY Bilateral 12/13/2020   1 week later pt had hematoma surgery on left breast   BREAST ENHANCEMENT SURGERY Bilateral 02/11/2017   BREATH  TEK H PYLORI N/A 07/22/2013   Procedure: BREATH TEK H PYLORI;  Surgeon: Edward Jolly, MD;  Location: [facility];  Service: General;  Laterality: N/A;   CESAREAN SECTION  2004, 2007   x 2   COLONOSCOPY  2013   CYSTOSCOPY WITH URETHRAL DILATATION  age 76    GASTRIC ROUX-EN-Y N/A 10/31/2013   Procedure: LAPAROSCOPIC ROUX-EN-Y GASTRIC BYPASS WITH UPPER ENDOSCOPY;  Surgeon: Edward Jolly, MD;  Location: [facility];  Service: General;  Laterality: N/A;   GLUTEUS MINIMUS REPAIR Left 05/28/2021   Procedure: Left Carpenter;  Surgeon: Vanetta Mulders, MD;  Location: [place];  Service: Orthopedics;  Laterality: Left;   ingrown toenail Right    Ingrown nail on great right toe   kidney stent  08/28/2014   mole excision     "benign juvenile melanoma" removed from left leg - inner thigh   POLYPECTOMY     2013   SKIN LESION EXCISION     back   TONSILLECTOMY  age 55   TUBAL LIGATION  2018   Patient Active Problem List   Diagnosis Date Noted   Left renal atrophy 12/19/2021   Stage 2 chronic kidney disease 11/20/2021   Meningitis 09/27/2021   Epigastric pain 09/27/2021   GERD (gastroesophageal reflux disease) 09/27/2021   Rash and nonspecific skin eruption 09/27/2021   Hypocalcemia 09/27/2021   Chronic pain syndrome 09/27/2021   Sepsis ([place])    Fever 09/25/2021   Tear of left gluteus medius tendon    Asthma 07/18/2020   Vitamin B 12 deficiency 10/16/2019   Elevated LFTs 10/16/2019   Asthma, exogenous, unspecified asthma severity, uncomplicated 38/18/2993   B12 deficiency anemia 06/21/2018   Venous insufficiency 04/23/2018   Onychomycosis of toenail 04/17/2018   Chronic myofascial pain 03/18/2018   Small fiber neuropathy 03/18/2018   Idiopathic small fiber sensory neuropathy 02/23/2018   Varicose veins of both lower extremities 01/13/2018   Cervical spondylosis without myelopathy 08/28/2017   Migraine with aura and without status migrainosus, not intractable 07/22/2017    Lumbar degenerative disc disease 07/20/2017   Spondylosis of lumbar spine 07/20/2017   Coccydynia 03/12/2017   Bariatric surgery status 05/02/2016   Major depressive disorder, recurrent episode, moderate ([place]) 02/18/2016   Tachy-brady syndrome ([place]) 12/19/2015   Iron deficiency anemia 06/10/2015   Sleep disorder 06/01/2013   Generalized anxiety disorder 05/30/2013   Chronic headaches 07/01/2012   Headache 07/01/2012   Depression, major, recurrent, severe with psychosis ([place]) 06/17/2012   Severe recurrent major depressive disorder with psychotic symptoms ([place]) 06/17/2012   Family history of colonic polyps 03/03/2012   Eating disorder 12/18/2011   Nausea & vomiting 01/22/2010   Polycystic ovarian syndrome 09/01/2008   Intermittent left-sided chest pain 08/02/2008    REFERRING PROVIDER: Vanetta Mulders, MD  REFERRING DIAG:  M25.511,G89.29,M25.512 (ICD-10-CM) - Chronic pain of both shoulders  S76.012A (ICD-10-CM) - Tear of left gluteus medius tendon, initial encounter  B/L shoulder program ROM and strenghening  L hip strengthening program  THERAPY DIAG:  Abnormal posture  Muscle weakness (generalized)  Other muscle spasm  Rationale for Evaluation and Treatment Rehabilitation  ONSET DATE: most recently June 2023  SUBJECTIVE:   SUBJECTIVE STATEMENT: Excruciating pain on post shoulder esp if I ER and reach behind. DN to upper traps was helpful. He thinks the pain is from the frozen shoulder.    PERTINENT HISTORY: Sensory neuropathy, chronic pain, MDD, DDD  PAIN:  Are you having pain? Yes: NPRS scale: 7-shoulder, 3-hip/10 Pain location: bil shoulder, Lt hip Pain description: see subj Aggravating factors: see subj Relieving factors: see subj  PRECAUTIONS: Other: sensory neropathy Does not tolerate seated or supine positioning well.   WEIGHT BEARING RESTRICTIONS No  FALLS:  Has patient fallen in last 6 months? No  LIVING ENVIRONMENT: Lives with: lives with  their family  OCCUPATION: not working  PLOF: Independent with basic ADLs and Independent with community mobility without device  PATIENT GOALS decr pain, incr ability and stamina, out of the bed more, up steps, dressing    OBJECTIVE:   DIAGNOSTIC FINDINGS: bil shoulder xrays unremarkable on 12/06/21  MRI 03/30/22 IMPRESSION: 1. Mild tendinosis of the supraspinatus tendon with a tiny interstitial tear. 2. Mild tendinosis of the infraspinatus tendon. 3. Mild tendinosis of the intra-articular portion of the long head of the biceps tendon adjacent to the bicipital-labral complex.  PATIENT SURVEYS:  FOTO Hip 40, Shoulder 45 FOTO 04/04/22: hip 43, shoulder 48  COGNITION:  Overall cognitive status: Within functional limits for tasks assessed     SENSATION: Lt UE  neuropathy-painful to touch, light sensation in Rt UE LLE neuropathy, both feet have a little bit  POSTURE: 12/22 noted that pt stands at midline with frequent swaying equal Rt to Lt  PALPATION: 12/22: pt presents with gross hypersensitivity but still very tender around hip and pelvis.   LOWER EXTREMITY ROM:  Active ROM- in standing Right eval Left eval Rt/Lt 12/8  Hip flexion 78 74 82/70  Hip extension 14 20 40/34  Hip abduction 20 18 30/20   (Blank rows = not tested)  Shoulder ROM:  1/12- prone ER at 60 deg abd-   36 passive ER is tolerable, able to passively achieve 50 with pain; actively to 42 deg  FUNCTIONAL TESTS:  6MWT 1033 ft    RPE: 8/10; pain up to 8/10 on both sides (Lt more than Rt)  12/29 998 ft   RPE 6/10; pain up to 6/10- mostly on left side (just a little on right)  GAIT: Distance walked: Rt trendelenburg- pt verbalizes sensation of instability Assistive device utilized: none at eval  AROM Right EVAL  Left EVAL Rt/Lt 12/8  Shoulder flexion 110 112 130/100  Shoulder extension 26 22 32/30  Shoulder abduction 108 100 120/80  Shoulder adduction     Shoulder internal rotation Below Sacrum  mid buttock Just past neutral not behind body To ipsilateral sacral border/to midline of buttock  Shoulder external rotation t2 c5 T2/C4  (Blank rows = not tested)  UEROM: Unable to hook bra behind back Difficulty shaving under arms, brushing hair, crossing arms hurts, unable to reach over either shoulder. *pt verbalized numbness in feet with end range overpressure in standing shoulder flexion   TODAY'S TREATMENT:  Treatment                            05/09/22:  Trigger Point Dry Needling, Manual Therapy Treatment:  Initial or subsequent education regarding Trigger Point Dry Needling: Subsequent Did patient give consent to treatment with Trigger Point Dry Needling: Yes TPDN with skilled palpation and monitoring followed by STM to the following muscles: Lt upper trap & levator Prone mobs: shoulder abducted to 60 deg with towel roll under shoulder/arm; inf & PA mobs to GHJ, inf first rib mobs, gross rib ER mobs  Paired mobs with passive MWM of GHJ ER Prone active ER from position listed above Standing single arm W- PT tactile cues for scapular motion   Treatment                            04/25/22:  Trigger Point Dry Needling, Manual Therapy Treatment:  Initial or subsequent education regarding Trigger Point Dry Needling: Initial Did patient give consent to treatment with Trigger Point Dry Needling: Yes TPDN with skilled palpation and monitoring followed by STM to the following muscles: Left upper trap  6 min walk  Manual prone rib mobs, scapular mobilzation on left side, STM at left periscapular region Prone scap retraction Prone deep breathing Yellow tband- triceps, ER, row, biceps curl   Treatment                            04/18/22:  Frozen water bottle massage to carpal tunnel and opponens group [place] massage to opponens Opponens stretching, wrist flexor stretch with elbow bent, finger to thumb and open Prone rib & thoracic mobs, STM to levators, upper traps; supine  cervical  distraction, suboccipital release, Lt cervical sidebend with Lt to Rt cervical mobs      PATIENT EDUCATION:  Education details: Anatomy of condition, POC, HEP, exercise form/rationale Person educated: Patient Education method: Explanation, Demonstration, Tactile cues, Verbal cues, and Handouts Education comprehension: verbalized understanding, returned demonstration, verbal cues required, tactile cues required, and needs further education   HOME EXERCISE PROGRAM: [id number]  ASSESSMENT:  CLINICAL IMPRESSION: Able to improve tolerance to ER in abd position today following treatment.   OBJECTIVE IMPAIRMENTS decreased activity tolerance, difficulty walking, decreased ROM, decreased strength, increased muscle spasms, impaired flexibility, impaired sensation, improper body mechanics, postural dysfunction, and pain.   ACTIVITY LIMITATIONS carrying, lifting, bending, sitting, standing, squatting, sleeping, stairs, transfers, bed mobility, bathing, toileting, dressing, reach over head, hygiene/grooming, locomotion level, and caring for others  PARTICIPATION LIMITATIONS: meal prep, cleaning, laundry, driving, shopping, and community activity  PERSONAL FACTORS 3+ comorbidities: see PMH  are also affecting patient's functional outcome.     GOALS: Goals reviewed with patient? Yes  SHORT TERM GOALS: Target date: 02/21/2022  GHJ IR to midline bilaterally Baseline: Goal status: achieved when done passively, pt reported Lt AC joint pain  2.  Pt will verbalize awareness of equal WB and more frequent corrections Baseline:  Goal status: achieved  3.  Independent in HEP as it has been established in the short term Baseline:  Goal status: achieved   LONG TERM GOALS: Target date: POC date  Pt will meet FOTO goals for hip and shoulder Baseline:  Goal status: ongoing  2.  6 MWT to distance greater than 50-59 age normative values Baseline: 41' on 12/29 Goal status:  3.  Able  to fix hair independently pain <=3/10 Baseline: severe pain still ongoing but I feel like I can move it better Goal status:ongoing  4.  Able to dress with minimal difficulty for shoulders and hip Baseline:  Goal status: ongoing  5.  Able to navigate stairs with reciprocal pattern Baseline:  Goal status: ongoing  6.  Able to tolerate ADLs without requiring long periods in her bed due to pain Baseline:  Goal status:ongoing   PLAN: PT FREQUENCY: 1-2x/week  PT DURATION: 10 weeks  PLANNED INTERVENTIONS: Therapeutic exercises, Therapeutic activity, Neuromuscular re-education, Balance training, Gait training, Patient/Family education, Self Care, Joint mobilization, Stair training, Aquatic Therapy, Dry Needling, Electrical stimulation, Spinal mobilization, Cryotherapy, Moist heat, Taping, Manual therapy, and Re-evaluation  PLAN FOR NEXT SESSION: continue prone manual work, mobility as tolerated.   Itzayanna Kaster C. Carlisia Geno PT, DPT 05/09/22 10:53 AM

## 2022-05-12 DIAGNOSIS — F331 Major depressive disorder, recurrent, moderate: Secondary | ICD-10-CM | POA: Diagnosis not present

## 2022-05-12 DIAGNOSIS — F411 Generalized anxiety disorder: Secondary | ICD-10-CM | POA: Diagnosis not present

## 2022-05-12 DIAGNOSIS — F5001 Anorexia nervosa, restricting type: Secondary | ICD-10-CM | POA: Diagnosis not present

## 2022-05-14 ENCOUNTER — Ambulatory Visit: Payer: PPO | Attending: Urology

## 2022-05-14 DIAGNOSIS — R279 Unspecified lack of coordination: Secondary | ICD-10-CM | POA: Diagnosis not present

## 2022-05-14 DIAGNOSIS — M6281 Muscle weakness (generalized): Secondary | ICD-10-CM | POA: Diagnosis not present

## 2022-05-14 DIAGNOSIS — M25552 Pain in left hip: Secondary | ICD-10-CM

## 2022-05-14 DIAGNOSIS — R293 Abnormal posture: Secondary | ICD-10-CM | POA: Diagnosis not present

## 2022-05-14 DIAGNOSIS — M62838 Other muscle spasm: Secondary | ICD-10-CM | POA: Diagnosis not present

## 2022-05-14 NOTE — Therapy (Signed)
OUTPATIENT PHYSICAL THERAPY TREATMENT NOTE Progress Note  Reporting Period 03/03/22 to 03/26/22  See note below for Objective Data and Assessment of Progress/Goals.    Patient Name: Katelyn [name] MRN: [id number] DOB:July 30, 1975, 48 y.o., female Today's Date: 05/14/2022  PCP: Kathyrn Drown, MD  REFERRING PROVIDER: Vira Agar, MD  END OF SESSION:   PT End of Session - 05/14/22 0754     Visit Number 19    Date for PT Re-Evaluation 06/27/22    Authorization Type HTA    PT Start Time 0800    PT Stop Time 0840    PT Time Calculation (min) 40 min    Activity Tolerance Patient tolerated treatment well    Behavior During Therapy [facility] for tasks assessed/performed             Past Medical History:  Diagnosis Date   Anemia    Anorexia    Anxiety    Asthma    controlled with meds   Benign juvenile melanoma    Chronic headaches    Colon polyps    found on colonoscopy 03/55/9741   Complication of anesthesia    itching after epidural for c section   Constipation    Depression    Depression    several suicide attempts, hospitaluzed in 2012 for this; hx pf ECT treatments ; pt sees Dr. Adele Schilder pyschiatrist  and doing well on medication   Dysrhythmia    hx of PVC's (pt hasn't seen cardiologist since 2021- no follow up needed)   GERD (gastroesophageal reflux disease)    Headache    Heart murmur    as a child - no one mentions hearing murmur anymore, sometimes it is heard. Echo has been performed 02/03/18   Heartburn    no meds   History of pneumonia    x 3  years ago - no recent problems   Hx of blood clots    Left renal atrophy 12/19/2021   Neuromuscular disorder (HCC)    small fiber neuropathy   Obesity    Pneumonia    Polycystic ovary    takes metformin to treat   Renal atrophy, left 2023   Skin lesion    Excisional biopsy of moles - none cancerous   Sleep apnea    yrs ago - diagnosed mild sleep apnea - did not have to use cpap    Past Surgical History:   Procedure Laterality Date   BREAST CAPSULECTOMY Bilateral 12/13/2020   1 week later pt had hematoma surgery on left breast   BREAST ENHANCEMENT SURGERY Bilateral 02/11/2017   BREATH TEK H PYLORI N/A 07/22/2013   Procedure: BREATH TEK H PYLORI;  Surgeon: Edward Jolly, MD;  Location: [facility];  Service: General;  Laterality: N/A;   CESAREAN SECTION  2004, 2007   x 2   COLONOSCOPY  2013   CYSTOSCOPY WITH URETHRAL DILATATION  age 32    GASTRIC ROUX-EN-Y N/A 10/31/2013   Procedure: LAPAROSCOPIC ROUX-EN-Y GASTRIC BYPASS WITH UPPER ENDOSCOPY;  Surgeon: Edward Jolly, MD;  Location: [facility];  Service: General;  Laterality: N/A;   GLUTEUS MINIMUS REPAIR Left 05/28/2021   Procedure: Left [place];  Surgeon: Vanetta Mulders, MD;  Location: [facility];  Service: Orthopedics;  Laterality: Left;   ingrown toenail Right    Ingrown nail on great right toe   kidney stent  08/28/2014   mole excision     "benign juvenile melanoma" removed from left leg - inner thigh  POLYPECTOMY     2013   SKIN LESION EXCISION     back   TONSILLECTOMY  age 26   TUBAL LIGATION  2018   Patient Active Problem List   Diagnosis Date Noted   Left renal atrophy 12/19/2021   Stage 2 chronic kidney disease 11/20/2021   Meningitis 09/27/2021   Epigastric pain 09/27/2021   GERD (gastroesophageal reflux disease) 09/27/2021   Rash and nonspecific skin eruption 09/27/2021   Hypocalcemia 09/27/2021   Chronic pain syndrome 09/27/2021   Sepsis ([place])    Fever 09/25/2021   Tear of left gluteus medius tendon    Asthma 07/18/2020   Vitamin B 12 deficiency 10/16/2019   Elevated LFTs 10/16/2019   Asthma, exogenous, unspecified asthma severity, uncomplicated 30/16/0109   B12 deficiency anemia 06/21/2018   Venous insufficiency 04/23/2018   Onychomycosis of toenail 04/17/2018   Chronic myofascial pain 03/18/2018   Small fiber neuropathy 03/18/2018   Idiopathic small fiber sensory neuropathy 02/23/2018    Varicose veins of both lower extremities 01/13/2018   Cervical spondylosis without myelopathy 08/28/2017   Migraine with aura and without status migrainosus, not intractable 07/22/2017   Lumbar degenerative disc disease 07/20/2017   Spondylosis of lumbar spine 07/20/2017   Coccydynia 03/12/2017   Bariatric surgery status 05/02/2016   Major depressive disorder, recurrent episode, moderate ([place]) 02/18/2016   Tachy-brady syndrome ([place]) 12/19/2015   Iron deficiency anemia 06/10/2015   Sleep disorder 06/01/2013   Generalized anxiety disorder 05/30/2013   Chronic headaches 07/01/2012   Headache 07/01/2012   Depression, major, recurrent, severe with psychosis ([place]) 06/17/2012   Severe recurrent major depressive disorder with psychotic symptoms ([place]) 06/17/2012   Family history of colonic polyps 03/03/2012   Eating disorder 12/18/2011   Nausea & vomiting 01/22/2010   Polycystic ovarian syndrome 09/01/2008   Intermittent left-sided chest pain 08/02/2008    REFERRING DIAG: M25.511,G89.29,M25.512 (ICD-10-CM) - Chronic pain of both shoulders S76.012A (ICD-10-CM) - Tear of left gluteus medius tendon, initial encounter  THERAPY DIAG:  Abnormal posture  Muscle weakness (generalized)  Other muscle spasm  Unspecified lack of coordination  Pain in left hip  Rationale for Evaluation and Treatment Rehabilitation  PERTINENT HISTORY: Breast augmentation surgeries with follow-up reduction with complication, c/s, gastric bypass, bil tubal ligation, long history of chronic low back/pelvic pain, pudendal neuralgia, coccydynia, Lt glute med repair  PRECAUTIONS: NA  SUBJECTIVE:                                                                                                                                                                                      SUBJECTIVE STATEMENT:  Pt states that she has not been here for  two weeks and can tell that her pain has been much worse. She does not want to do  any internal work due to being on menstrual cycle.    PAIN:  Are you having pain? Yes, 7/10, Lt hip and low back   03/03/22 SUBJECTIVE STATEMENT: Pt had surgery for Lt glute med tear that most likely happened during fall 3 years ago. She has still had pain in Lt hip, but she is in rehab due to it. She had meningitis in June and ended up having Bil frozen shoulders. Her biggest complaint is of back pain, which impacts her bil shoulder pain because she has to lay on either side. She mostly lies on Rt side, which causes pain in Rt low back and Rt hip. She is rarely getting pressure off of buttocks. She continues to have sacral/coccydynia with sitting and avoids this position. She is not currently having vaginal/pelvic pain, but states this is coming and going - she manages by decreasing frequency of intercourse - no 1x/week. She is having issue with Lt kidney, but MD states that urinary issues are not related.  Is currently in PT for hip and shoulders - primary working on shoulders. Is only doing some basic leg strengthening and shoulder exercises at home.  Fluid intake: Yes: -     PAIN:  Are you having pain? Yes NPRS scale: 8/10Right and Left Pain location:    Pain type: sharp and pressure Pain description: constant    Aggravating factors: sitting, driving, finding comfortable position Relieving factors: lying on stomach relieves buttock pain if she can, but shoulders prevent this   PRECAUTIONS: None   WEIGHT BEARING RESTRICTIONS: No   FALLS:  Has patient fallen in last 6 months? No   LIVING ENVIRONMENT: Lives with: lives with their family Lives in: House/apartment   OCCUPATION: disabled   PLOF: Independent   PATIENT GOALS: would like to be able to sit; decrease bil hip/low back pain   PERTINENT HISTORY:  Breast augmentation surgeries with follow-up reduction with complication, c/s, gastric bypass, bil tubal ligation, long history of chronic low back/pelvic pain, pudendal  neuralgia, coccydynia, Lt glute med repair Sexual abuse: No   BOWEL MOVEMENT: Pain with bowel movement: No Type of bowel movement:Frequency 1x/day and Strain Yes Fully empty rectum: Yes: - Leakage: No Pads: No Fiber supplement: No no medication to help stimulate bowel movements   URINATION: Pain with urination: No Fully empty bladder: No Stream:  trouble starting the stream Urgency: no Frequency: every 30 minutes to an hour - worse once she does go, then she has to keep going - easier to just hold it Leakage:  none Pads: No   INTERCOURSE: Pain with intercourse: Initial Penetration and During Penetration Ability to have vaginal penetration:  Yes: pain - only tolerates 1x/week Climax: yes Marinoff Scale: 2/3   PREGNANCY: Vaginal deliveries 0 Tearing No C-section deliveries 2 Currently pregnant No       OBJECTIVE:  03/03/22: DIAGNOSTIC FINDINGS:  Lasix renogram with no abnormal findings   PATIENT SURVEYS:    PFIQ-7 100%   COGNITION: Overall cognitive status: Within functional limits for tasks assessed                          SENSATION: Light touch: Appears intact Proprioception: Appears intact   MUSCLE LENGTH:     FUNCTIONAL TESTS:              5xSTS: 29 seconds   GAIT:  Comments: decreased bil hip extension, posterior pelvic tilt   POSTURE: rounded shoulders, forward head, increased lumbar lordosis, and posterior pelvic tilt   PELVIC ALIGNMENT:   LUMBARAROM/PROM:   A/PROM A/PROM  Eval (%)  Flexion 50, pain on Rt  Extension 20, no pain, very limited mobility  Right lateral flexion 50, pain in Rt low back  Left lateral flexion 75, pain in Rt low back  Right rotation 50, good stretch  Left rotation 50, good stretch   (Blank rows = not tested)     LOWER EXTREMITY MMT: tested in standing due to comfort             Rt: 3/5 hip abduction and extension, 4/5 adduction and flexion             Lt: 3/5 hip abduction, extension, flexion, 4/5  adduction     PALPATION:   General  tenderness throughout bil lumbar paraspinals and gluts, Lt>Rt; tenderness and spasm in thoracic paraspinals with decreased mobility upon deep breathing; scar tissue restriction under bil breasts that is tender; scar tissue restriction Lt glut med surgical incision                 External Perineal Exam NA                             Internal Pelvic Floor NA   Patient confirms identification and approves PT to assess internal pelvic floor and treatment Yes for future need   PELVIC MMT:   MMT eval  Vaginal    Internal Anal Sphincter    External Anal Sphincter    Puborectalis    Diastasis Recti    (Blank rows = not tested)         TONE: NA   PROLAPSE: NA   TREATMENT 05/14/22 Manual: Negative pressure soft tissue mobilization to Lt hip, glutes, sacral/coccygeal border Soft tissue mobilization to posterolateral hip muscles Scar tissue mobilization to Lt hip incision and surrounding area Soft tissue mobilization to bil lumbar paraspinals and QL  Exercises: Cat cow Wagging dog Standing lateral flexion Supine ball reverse table top knee rocks   TREATMENT 04/22/22 Manual: Soft tissue mobilization to posterolateral hip muscles Scar tissue mobilization to Lt hip incision and surrounding area External tailbone muscle attachment release Internal vaginal deep pelvic floor release bil No emotional/communication barriers or cognitive limitation. Patient is motivated to learn. Patient understands and agrees with treatment goals and plan. PT explains patient will be examined in standing, sitting, and lying down to see how their muscles and joints work. When they are ready, they will be asked to remove their underwear so PT can examine their perineum. The patient is also given the option of providing their own chaperone as one is not provided in our facility. The patient also has the right and is explained the right to defer or refuse any part of the  evaluation or treatment including the internal exam. With the patient's consent, PT will use one gloved finger to gently assess the muscles of the pelvic floor, seeing how well it contracts and relaxes and if there is muscle symmetry. After, the patient will get dressed and PT and patient will discuss exam findings and plan of care. PT and patient discuss plan of care, schedule, attendance policy and HEP activities. External perineal generalized release on Lt, pressing caudally with exhales Soft tissue mobilization to Lt adductors Exercises: Press wall extensions Standing lumbar extensions with support  TREATMENT 04/14/22: Manual: Negative pressure soft tissue mobilization to Lt hip, glutes, sacral/coccygeal border Soft tissue mobilization to posterolateral hip muscles Scar tissue mobilization to Lt hip incision and surrounding area Neuro: Cat cow on table  Modified child's pose gliding on table Forward hold hang    PATIENT EDUCATION:  Education details: see above self-care Person educated: Patient Education method: Explanation, Demonstration, Tactile cues, Verbal cues, and Handouts Education comprehension: verbalized understanding   HOME EXERCISE PROGRAM: Written handout   ASSESSMENT:   CLINICAL IMPRESSION: Pt has seen an increase in pain over the last several weeks without being able to come to PT; however, she is overall seeing an improvement in symptoms and is able to now use toilet without seat for half of the day each day. She is working on performing more exercises. Internal pelvic floor release last session helped reduce labial pain and we will continue once patient is no longer on menstrual cycle per her request. Good tolerance to all techniques performed today with pt-reported decrease in pain. She will continue to benefit from skilled PT intervention in order to decrease pain, improve functional ability, increase ease of starting stream/emptying bladder, and improve QOL.     OBJECTIVE IMPAIRMENTS: decreased activity tolerance, decreased coordination, decreased endurance, decreased mobility, decreased ROM, decreased strength, hypomobility, increased fascial restrictions, increased muscle spasms, impaired flexibility, impaired tone, postural dysfunction, and pain.    ACTIVITY LIMITATIONS: bending, sitting, standing, stairs, transfers, bed mobility, and locomotion level   PARTICIPATION LIMITATIONS: cleaning, laundry, interpersonal relationship, driving, and community activity   PERSONAL FACTORS: 3+ comorbidities: Breast augmentation surgeries with follow-up reduction with complication, c/s, gastric bypass, bil tubal ligation, long history of chronic low back/pelvic pain, pudendal neuralgia, coccydynia, Lt glut med repair  are also affecting patient's functional outcome.    REHAB POTENTIAL: Good   CLINICAL DECISION MAKING: Stable/uncomplicated   EVALUATION COMPLEXITY: Low     GOALS: Goals reviewed with patient? Yes   SHORT TERM GOALS: Target date: 03/31/2022 - updated 03/26/22 updated 05/14/22   Pt will be independent with HEP.    Baseline: Goal status: MET 03/26/22   2.  Pt will be independent with diaphragmatic breathing and down training activities in order to improve pelvic floor relaxation and abdominal wall mobility.   Baseline:  Goal status: MET 05/14/22   3.  Pt will report improve stream initiation and complete voiding with urination in order to improve QOL. Baseline:  Goal status: IN PROGRESS              4.  Pt will increase all impaired lumbar A/ROM by 25% without pain.   Baseline:  Goal status: IN PROGRESS   LONG TERM GOALS: Target date: 04/28/2022 - updated 03/26/22 - updated 05/14/22   Pt will be independent with advanced HEP.    Baseline:  Goal status:IN PROGRESS   2.  Pt will be able to use regular toilet without difficulty. Baseline: Only using toilet chair 50% of the time now 05/14/22 Goal status: IN PROGRESS   3.  Pt will be  able to ascend/descend 12 steps at regular intervals throughout the day safely and with minimal difficulty.  Baseline:  Goal status: IN PROGRESS   4.  Pt will improve 5xSTS to less than 20 seconds and improved PFIQ by 25% in order to demonstrate improved QOL and functional ability.  Baseline: 28 seconds Goal status: IN PROGRESS   5.  Patient will report no greater than 5/10 pain with sitting in order to more easily attend  appointments, drive, and spend time with family.  Baseline:  Goal status:IN PROGRESS   6.  Pt will demonstrate increase in all impaired hip strength by 1 muscle grades in order to demonstrate improved lumbopelvic support and increase functional ability.      Baseline:  Goal status: IN PROGRESS   PLAN:   PT FREQUENCY: 1x/week   PT DURATION: 8 weeks   PLANNED INTERVENTIONS: Therapeutic exercises, Therapeutic activity, Neuromuscular re-education, Balance training, Gait training, Patient/Family education, Self Care, Joint mobilization, Aquatic Therapy, Dry Needling, Biofeedback, and Manual therapy   PLAN FOR NEXT SESSION: Address goals; continue manual techniques as needed; progress mobility exercises.    Heather Roberts, PT, DPT01/17/248:46 AM

## 2022-05-15 ENCOUNTER — Ambulatory Visit (INDEPENDENT_AMBULATORY_CARE_PROVIDER_SITE_OTHER): Payer: PPO | Admitting: Family Medicine

## 2022-05-15 ENCOUNTER — Encounter: Payer: Self-pay | Admitting: Family Medicine

## 2022-05-15 VITALS — BP 128/68

## 2022-05-15 DIAGNOSIS — R1012 Left upper quadrant pain: Secondary | ICD-10-CM | POA: Diagnosis not present

## 2022-05-15 MED ORDER — SUCRALFATE 1 G PO TABS: 1.0000 g | ORAL_TABLET | Freq: Three times a day (TID) | ORAL | 0 refills | Status: DC

## 2022-05-15 NOTE — Progress Notes (Signed)
   Subjective:    Patient ID: Katelyn [name], female    DOB: [birth year], 47 y.o.   MRN: [id number]  Abdominal Pain This is a new problem. The current episode started in the past 7 days (Monday 05/12/22). The pain is located in the LLQ. The quality of the pain is a sensation of fullness.  Pt states she has not been able to eat; only able to eat a few bites.  Very nice patient She is here today because of abdominal pain which started up earlier this week She describes it more in the left upper quadrant It is of pain and discomfort and also fullness she has had a previous gastric bypass surgery she is also had previous ulcers where the bypass anastomosis is she recently received a steroid shot she shot was in her shoulder.  She denies any vomiting except for bringing up some whitish phlegm denies any hematochezia or hematemesis.  No fever chills or sweats.   Review of Systems  Gastrointestinal:  Positive for abdominal pain.  She denies wheezing and coughing denies fever sweats or chills denies rectal bleeding.  She does have chronic discomfort but this is not contributing to this issue     Objective:   Physical Exam Lungs are clear hearts regular abdomen is mild tenderness in the left upper quadrant and epigastric region no guarding or rebound       Assessment & Plan:  Abdominal pain I do not feel she needs to have a CT scan or EGD currently.  She is already doing omeprazole twice daily Add Carafate 3 times daily Lab work ordered She will do this first thing in the morning we will await the results Obviously if she gets worse we will need to get gastroenterology involved currently right now I do not feel she needs to have a CT scan.

## 2022-05-16 ENCOUNTER — Other Ambulatory Visit: Payer: Self-pay | Admitting: Family Medicine

## 2022-05-16 ENCOUNTER — Encounter (HOSPITAL_BASED_OUTPATIENT_CLINIC_OR_DEPARTMENT_OTHER): Payer: Self-pay | Admitting: Physical Therapy

## 2022-05-16 ENCOUNTER — Ambulatory Visit (HOSPITAL_BASED_OUTPATIENT_CLINIC_OR_DEPARTMENT_OTHER): Payer: PPO | Admitting: Physical Therapy

## 2022-05-16 ENCOUNTER — Other Ambulatory Visit (HOSPITAL_COMMUNITY)
Admission: RE | Admit: 2022-05-16 | Discharge: 2022-05-16 | Disposition: A | Discharge status: Home / Self Care | Payer: PPO | Source: Ambulatory Visit | Attending: Family Medicine | Admitting: Family Medicine

## 2022-05-16 DIAGNOSIS — R1012 Left upper quadrant pain: Secondary | ICD-10-CM | POA: Diagnosis not present

## 2022-05-16 DIAGNOSIS — R293 Abnormal posture: Secondary | ICD-10-CM | POA: Diagnosis not present

## 2022-05-16 DIAGNOSIS — M6281 Muscle weakness (generalized): Secondary | ICD-10-CM

## 2022-05-16 DIAGNOSIS — M62838 Other muscle spasm: Secondary | ICD-10-CM

## 2022-05-16 LAB — CBC WITH DIFFERENTIAL/PLATELET
Abs Immature Granulocytes: 0.07 10*3/uL (ref 0.00–0.07)
Basophils Absolute: 0 10*3/uL (ref 0.0–0.1)
Basophils Relative: 0 %
Eosinophils Absolute: 0.1 10*3/uL (ref 0.0–0.5)
Eosinophils Relative: 1 %
HCT: 44 % (ref 36.0–46.0)
Hemoglobin: 14.3 g/dL (ref 12.0–15.0)
Immature Granulocytes: 1 %
Lymphocytes Relative: 13 %
Lymphs Abs: 1.6 10*3/uL (ref 0.7–4.0)
MCH: 32.2 pg (ref 26.0–34.0)
MCHC: 32.5 g/dL (ref 30.0–36.0)
MCV: 99.1 fL (ref 80.0–100.0)
Monocytes Absolute: 0.6 10*3/uL (ref 0.1–1.0)
Monocytes Relative: 5 %
Neutro Abs: 9.5 10*3/uL — ABNORMAL HIGH (ref 1.7–7.7)
Neutrophils Relative %: 80 %
Platelets: 441 10*3/uL — ABNORMAL HIGH (ref 150–400)
RBC: 4.44 MIL/uL (ref 3.87–5.11)
RDW: 13 % (ref 11.5–15.5)
WBC: 11.9 10*3/uL — ABNORMAL HIGH (ref 4.0–10.5)
nRBC: 0 % (ref 0.0–0.2)

## 2022-05-16 LAB — COMPREHENSIVE METABOLIC PANEL
ALT: 22 U/L (ref 0–44)
AST: 21 U/L (ref 15–41)
Albumin: 4.4 g/dL (ref 3.5–5.0)
Alkaline Phosphatase: 111 U/L (ref 38–126)
Anion gap: 10 (ref 5–15)
BUN: 13 mg/dL (ref 6–20)
CO2: 28 mmol/L (ref 22–32)
Calcium: 8.8 mg/dL — ABNORMAL LOW (ref 8.9–10.3)
Chloride: 100 mmol/L (ref 98–111)
Creatinine, Ser: 0.84 mg/dL (ref 0.44–1.00)
GFR, Estimated: >60 mL/min (ref >60)
Glucose, Bld: 107 mg/dL — ABNORMAL HIGH (ref 70–99)
Potassium: 4.4 mmol/L (ref 3.5–5.1)
Sodium: 138 mmol/L (ref 135–145)
Total Bilirubin: 0.5 mg/dL (ref 0.3–1.2)
Total Protein: 7.6 g/dL (ref 6.5–8.1)

## 2022-05-16 LAB — LIPASE, BLOOD: Lipase: 37 U/L (ref 11–51)

## 2022-05-16 NOTE — Therapy (Signed)
OUTPATIENT PHYSICAL THERAPY LOWER EXTREMITY TREATMENT   Patient Name: Katelyn [name] MRN: [id number] DOB:1975-08-02, 47 y.o., female Today's Date: 05/09/2022   PT End of Session - 05/09/22 1012     Visit Number 18    Date for PT Re-Evaluation 06/27/22    Authorization Type HTA    PT Start Time 1012    PT Stop Time 1050    PT Time Calculation (min) 38 min    Activity Tolerance Patient tolerated treatment well    Behavior During Therapy [facility] for tasks assessed/performed                     Past Medical History:  Diagnosis Date   Anemia    Anorexia    Anxiety    Asthma    controlled with meds   Benign juvenile melanoma    Chronic headaches    Colon polyps    found on colonoscopy 96/78/9381   Complication of anesthesia    itching after epidural for c section   Constipation    Depression    Depression    several suicide attempts, hospitaluzed in 2012 for this; hx pf ECT treatments ; pt sees Dr. Adele Schilder pyschiatrist  and doing well on medication   Dysrhythmia    hx of PVC's (pt hasn't seen cardiologist since 2021- no follow up needed)   GERD (gastroesophageal reflux disease)    Headache    Heart murmur    as a child - no one mentions hearing murmur anymore, sometimes it is heard. Echo has been performed 02/03/18   Heartburn    no meds   History of pneumonia    x 3  years ago - no recent problems   Hx of blood clots    Left renal atrophy 12/19/2021   Neuromuscular disorder (HCC)    small fiber neuropathy   Obesity    Pneumonia    Polycystic ovary    takes metformin to treat   Renal atrophy, left 2023   Skin lesion    Excisional biopsy of moles - none cancerous   Sleep apnea    yrs ago - diagnosed mild sleep apnea - did not have to use cpap    Past Surgical History:  Procedure Laterality Date   BREAST CAPSULECTOMY Bilateral 12/13/2020   1 week later pt had hematoma surgery on left breast   BREAST ENHANCEMENT SURGERY Bilateral 02/11/2017   BREATH  TEK H PYLORI N/A 07/22/2013   Procedure: BREATH TEK H PYLORI;  Surgeon: Edward Jolly, MD;  Location: [facility];  Service: General;  Laterality: N/A;   CESAREAN SECTION  2004, 2007   x 2   COLONOSCOPY  2013   CYSTOSCOPY WITH URETHRAL DILATATION  age 55    GASTRIC ROUX-EN-Y N/A 10/31/2013   Procedure: LAPAROSCOPIC ROUX-EN-Y GASTRIC BYPASS WITH UPPER ENDOSCOPY;  Surgeon: Edward Jolly, MD;  Location: [facility];  Service: General;  Laterality: N/A;   GLUTEUS MINIMUS REPAIR Left 05/28/2021   Procedure: Left [place];  Surgeon: Vanetta Mulders, MD;  Location: [place];  Service: Orthopedics;  Laterality: Left;   ingrown toenail Right    Ingrown nail on great right toe   kidney stent  08/28/2014   mole excision     "benign juvenile melanoma" removed from left leg - inner thigh   POLYPECTOMY     2013   SKIN LESION EXCISION     back   TONSILLECTOMY  age 77   TUBAL LIGATION  2018   Patient Active Problem List   Diagnosis Date Noted   Left renal atrophy 12/19/2021   Stage 2 chronic kidney disease 11/20/2021   Meningitis 09/27/2021   Epigastric pain 09/27/2021   GERD (gastroesophageal reflux disease) 09/27/2021   Rash and nonspecific skin eruption 09/27/2021   Hypocalcemia 09/27/2021   Chronic pain syndrome 09/27/2021   Sepsis ([place])    Fever 09/25/2021   Tear of left gluteus medius tendon    Asthma 07/18/2020   Vitamin B 12 deficiency 10/16/2019   Elevated LFTs 10/16/2019   Asthma, exogenous, unspecified asthma severity, uncomplicated 48/54/6270   B12 deficiency anemia 06/21/2018   Venous insufficiency 04/23/2018   Onychomycosis of toenail 04/17/2018   Chronic myofascial pain 03/18/2018   Small fiber neuropathy 03/18/2018   Idiopathic small fiber sensory neuropathy 02/23/2018   Varicose veins of both lower extremities 01/13/2018   Cervical spondylosis without myelopathy 08/28/2017   Migraine with aura and without status migrainosus, not intractable 07/22/2017    Lumbar degenerative disc disease 07/20/2017   Spondylosis of lumbar spine 07/20/2017   Coccydynia 03/12/2017   Bariatric surgery status 05/02/2016   Major depressive disorder, recurrent episode, moderate ([place]) 02/18/2016   Tachy-brady syndrome ([place]) 12/19/2015   Iron deficiency anemia 06/10/2015   Sleep disorder 06/01/2013   Generalized anxiety disorder 05/30/2013   Chronic headaches 07/01/2012   Headache 07/01/2012   Depression, major, recurrent, severe with psychosis ([place]) 06/17/2012   Severe recurrent major depressive disorder with psychotic symptoms ([place]) 06/17/2012   Family history of colonic polyps 03/03/2012   Eating disorder 12/18/2011   Nausea & vomiting 01/22/2010   Polycystic ovarian syndrome 09/01/2008   Intermittent left-sided chest pain 08/02/2008    REFERRING PROVIDER: Vanetta Mulders, MD  REFERRING DIAG:  M25.511,G89.29,M25.512 (ICD-10-CM) - Chronic pain of both shoulders  S76.012A (ICD-10-CM) - Tear of left gluteus medius tendon, initial encounter  B/L shoulder program ROM and strenghening  L hip strengthening program  THERAPY DIAG:  Abnormal posture  Muscle weakness (generalized)  Other muscle spasm  Rationale for Evaluation and Treatment Rehabilitation  ONSET DATE: most recently June 2023  SUBJECTIVE:   SUBJECTIVE STATEMENT: Was pretty sore after needling. A lot of stomach pain since Monday.    PERTINENT HISTORY: Sensory neuropathy, chronic pain, MDD, DDD  PAIN:  Are you having pain? Yes: NPRS scale: 7-shoulder, 6-hip/10 Pain location: bil shoulder, Lt hip Pain description: see subj Aggravating factors: see subj Relieving factors: see subj  PRECAUTIONS: Other: sensory neropathy Does not tolerate seated or supine positioning well.   WEIGHT BEARING RESTRICTIONS No  FALLS:  Has patient fallen in last 6 months? No  LIVING ENVIRONMENT: Lives with: lives with their family  OCCUPATION: not working  PLOF: Independent with basic ADLs  and Independent with community mobility without device  PATIENT GOALS decr pain, incr ability and stamina, out of the bed more, up steps, dressing    OBJECTIVE:   DIAGNOSTIC FINDINGS: bil shoulder xrays unremarkable on 12/06/21  MRI 03/30/22 IMPRESSION: 1. Mild tendinosis of the supraspinatus tendon with a tiny interstitial tear. 2. Mild tendinosis of the infraspinatus tendon. 3. Mild tendinosis of the intra-articular portion of the long head of the biceps tendon adjacent to the bicipital-labral complex.  PATIENT SURVEYS:  FOTO Hip 40, Shoulder 45 FOTO 04/04/22: hip 43, shoulder 48  COGNITION:  Overall cognitive status: Within functional limits for tasks assessed     SENSATION: Lt UE neuropathy-painful to touch, light sensation in Rt UE LLE neuropathy, both feet have a little  bit  POSTURE: 12/22 noted that pt stands at midline with frequent swaying equal Rt to Lt  PALPATION: 12/22: pt presents with gross hypersensitivity but still very tender around hip and pelvis.   LOWER EXTREMITY ROM:  Active ROM- in standing Right eval Left eval Rt/Lt 12/8  Hip flexion 78 74 82/70  Hip extension 14 20 40/34  Hip abduction 20 18 30/20   (Blank rows = not tested)  Shoulder ROM:  1/12- prone ER at 60 deg abd-   36 passive ER is tolerable, able to passively achieve 50 with pain; actively to 42 deg  FUNCTIONAL TESTS:  6MWT 1033 ft    RPE: 8/10; pain up to 8/10 on both sides (Lt more than Rt)  12/29 998 ft   RPE 6/10; pain up to 6/10- mostly on left side (just a little on right)  GAIT: Distance walked: Rt trendelenburg- pt verbalizes sensation of instability Assistive device utilized: none at eval  AROM Right EVAL  Left EVAL Rt/Lt 12/8 Rt/Lt 1/19  Shoulder flexion 110 112 130/100 144/115  Shoulder extension 26 22 32/30   Shoulder abduction 108 100 120/80 124/110  Shoulder adduction      Shoulder internal rotation Below Sacrum mid buttock Just past neutral not behind body  To ipsilateral sacral border/to midline of buttock Rt- ipsilat SIJ Lt-mid ipsilat buttock   Shoulder external rotation t2 c5 T2/C4 C6/T4  (Blank rows = not tested)  UEROM: Unable to hook bra behind back Difficulty shaving under arms, brushing hair, crossing arms hurts, unable to reach over either shoulder. *pt verbalized numbness in feet with end range overpressure in standing shoulder flexion   TODAY'S TREATMENT:  Treatment                            05/16/22:  Prone manual rib mobilization, STM to Lt upper trap, levator & periscapular region, assisted scapular retraction Pulleys into flexion AAROM flexion with wand, W with wand Cross body adduction stretch Door pec stretch    05/09/22:  Trigger Point Dry Needling, Manual Therapy Treatment:  Initial or subsequent education regarding Trigger Point Dry Needling: Subsequent Did patient give consent to treatment with Trigger Point Dry Needling: Yes TPDN with skilled palpation and monitoring followed by STM to the following muscles: Lt upper trap & levator Prone mobs: shoulder abducted to 60 deg with towel roll under shoulder/arm; inf & PA mobs to GHJ, inf first rib mobs, gross rib ER mobs  Paired mobs with passive MWM of GHJ ER Prone active ER from position listed above Standing single arm W- PT tactile cues for scapular motion   Treatment                            04/25/22:  Trigger Point Dry Needling, Manual Therapy Treatment:  Initial or subsequent education regarding Trigger Point Dry Needling: Initial Did patient give consent to treatment with Trigger Point Dry Needling: Yes TPDN with skilled palpation and monitoring followed by STM to the following muscles: Left upper trap  6 min walk  Manual prone rib mobs, scapular mobilzation on left side, STM at left periscapular region Prone scap retraction Prone deep breathing Yellow tband- triceps, ER, row, biceps curl   Treatment                            04/18/22:  Frozen  water  bottle massage to carpal tunnel and opponens group [place] massage to opponens Opponens stretching, wrist flexor stretch with elbow bent, finger to thumb and open Prone rib & thoracic mobs, STM to levators, upper traps; supine cervical distraction, suboccipital release, Lt cervical sidebend with Lt to Rt cervical mobs      PATIENT EDUCATION:  Education details: Geophysicist/field seismologist of condition, POC, HEP, exercise form/rationale Person educated: Patient Education method: Explanation, Demonstration, Tactile cues, Verbal cues, and Handouts Education comprehension: verbalized understanding, returned demonstration, verbal cues required, tactile cues required, and needs further education   HOME EXERCISE PROGRAM: [id number]  ASSESSMENT:  CLINICAL IMPRESSION: Pt reported muscle soreness rather than pain with exercises today. Improving ROM notable with fewer visual, facial signs of pain in motion.   OBJECTIVE IMPAIRMENTS decreased activity tolerance, difficulty walking, decreased ROM, decreased strength, increased muscle spasms, impaired flexibility, impaired sensation, improper body mechanics, postural dysfunction, and pain.   ACTIVITY LIMITATIONS carrying, lifting, bending, sitting, standing, squatting, sleeping, stairs, transfers, bed mobility, bathing, toileting, dressing, reach over head, hygiene/grooming, locomotion level, and caring for others  PARTICIPATION LIMITATIONS: meal prep, cleaning, laundry, driving, shopping, and community activity  PERSONAL FACTORS 3+ comorbidities: see PMH  are also affecting patient's functional outcome.     GOALS: Goals reviewed with patient? Yes  SHORT TERM GOALS: Target date: 02/21/2022  GHJ IR to midline bilaterally Baseline: Goal status: achieved when done passively, pt reported Lt AC joint pain  2.  Pt will verbalize awareness of equal WB and more frequent corrections Baseline:  Goal status: achieved  3.  Independent in HEP as it has been  established in the short term Baseline:  Goal status: achieved   LONG TERM GOALS: Target date: POC date  Pt will meet FOTO goals for hip and shoulder Baseline:  Goal status: ongoing  2.  6 MWT to distance greater than 50-59 age normative values Baseline: 70' on 12/29 Goal status:  3.  Able to fix hair independently pain <=3/10 Baseline: severe pain still ongoing but I feel like I can move it better Goal status:ongoing  4.  Able to dress with minimal difficulty for shoulders and hip Baseline:  Goal status: ongoing  5.  Able to navigate stairs with reciprocal pattern Baseline:  Goal status: ongoing  6.  Able to tolerate ADLs without requiring long periods in her bed due to pain Baseline:  Goal status:ongoing   PLAN: PT FREQUENCY: 1-2x/week  PT DURATION: 10 weeks  PLANNED INTERVENTIONS: Therapeutic exercises, Therapeutic activity, Neuromuscular re-education, Balance training, Gait training, Patient/Family education, Self Care, Joint mobilization, Stair training, Aquatic Therapy, Dry Needling, Electrical stimulation, Spinal mobilization, Cryotherapy, Moist heat, Taping, Manual therapy, and Re-evaluation  PLAN FOR NEXT SESSION: continue prone manual work, mobility as tolerated.   Barclay Lennox C. Cici Rodriges PT, DPT 05/09/22 10:53 AM

## 2022-05-19 ENCOUNTER — Ambulatory Visit: Payer: PPO

## 2022-05-19 DIAGNOSIS — R279 Unspecified lack of coordination: Secondary | ICD-10-CM

## 2022-05-19 DIAGNOSIS — M62838 Other muscle spasm: Secondary | ICD-10-CM

## 2022-05-19 DIAGNOSIS — M6281 Muscle weakness (generalized): Secondary | ICD-10-CM

## 2022-05-19 DIAGNOSIS — R293 Abnormal posture: Secondary | ICD-10-CM | POA: Diagnosis not present

## 2022-05-19 DIAGNOSIS — M25552 Pain in left hip: Secondary | ICD-10-CM | POA: Diagnosis not present

## 2022-05-19 NOTE — Therapy (Signed)
OUTPATIENT PHYSICAL THERAPY TREATMENT NOTE Progress Note  Reporting Period 03/03/22 to 03/26/22  See note below for Objective Data and Assessment of Progress/Goals.    Patient Name: Katelyn [name] MRN: [id number] DOB:Jan 19, 1976, 47 y.o., female Today's Date: 05/19/2022  PCP: Kathyrn Drown, MD  REFERRING PROVIDER: Vira Agar, MD  END OF SESSION:   PT End of Session - 05/19/22 0753     Visit Number 21    Date for PT Re-Evaluation 06/27/22    Authorization Type HTA    PT Start Time 0800    PT Stop Time 0839    PT Time Calculation (min) 39 min    Activity Tolerance Patient tolerated treatment well    Behavior During Therapy [facility] for tasks assessed/performed             Past Medical History:  Diagnosis Date   Anemia    Anorexia    Anxiety    Asthma    controlled with meds   Benign juvenile melanoma    Chronic headaches    Colon polyps    found on colonoscopy 54/27/0623   Complication of anesthesia    itching after epidural for c section   Constipation    Depression    Depression    several suicide attempts, hospitaluzed in 2012 for this; hx pf ECT treatments ; pt sees Dr. Adele Schilder pyschiatrist  and doing well on medication   Dysrhythmia    hx of PVC's (pt hasn't seen cardiologist since 2021- no follow up needed)   GERD (gastroesophageal reflux disease)    Headache    Heart murmur    as a child - no one mentions hearing murmur anymore, sometimes it is heard. Echo has been performed 02/03/18   Heartburn    no meds   History of pneumonia    x 3  years ago - no recent problems   Hx of blood clots    Left renal atrophy 12/19/2021   Neuromuscular disorder (HCC)    small fiber neuropathy   Obesity    Pneumonia    Polycystic ovary    takes metformin to treat   Renal atrophy, left 2023   Skin lesion    Excisional biopsy of moles - none cancerous   Sleep apnea    yrs ago - diagnosed mild sleep apnea - did not have to use cpap    Past Surgical History:   Procedure Laterality Date   BREAST CAPSULECTOMY Bilateral 12/13/2020   1 week later pt had hematoma surgery on left breast   BREAST ENHANCEMENT SURGERY Bilateral 02/11/2017   BREATH TEK H PYLORI N/A 07/22/2013   Procedure: BREATH TEK H PYLORI;  Surgeon: Edward Jolly, MD;  Location: [facility];  Service: General;  Laterality: N/A;   CESAREAN SECTION  2004, 2007   x 2   COLONOSCOPY  2013   CYSTOSCOPY WITH URETHRAL DILATATION  age 67    GASTRIC ROUX-EN-Y N/A 10/31/2013   Procedure: LAPAROSCOPIC ROUX-EN-Y GASTRIC BYPASS WITH UPPER ENDOSCOPY;  Surgeon: Edward Jolly, MD;  Location: [facility];  Service: General;  Laterality: N/A;   GLUTEUS MINIMUS REPAIR Left 05/28/2021   Procedure: Left [place];  Surgeon: Vanetta Mulders, MD;  Location: [facility];  Service: Orthopedics;  Laterality: Left;   ingrown toenail Right    Ingrown nail on great right toe   kidney stent  08/28/2014   mole excision     "benign juvenile melanoma" removed from left leg - inner thigh  POLYPECTOMY     2013   SKIN LESION EXCISION     back   TONSILLECTOMY  age 87   TUBAL LIGATION  2018   Patient Active Problem List   Diagnosis Date Noted   Left renal atrophy 12/19/2021   Stage 2 chronic kidney disease 11/20/2021   Meningitis 09/27/2021   Epigastric pain 09/27/2021   GERD (gastroesophageal reflux disease) 09/27/2021   Rash and nonspecific skin eruption 09/27/2021   Hypocalcemia 09/27/2021   Chronic pain syndrome 09/27/2021   Sepsis ([place])    Fever 09/25/2021   Tear of left gluteus medius tendon    Asthma 07/18/2020   Vitamin B 12 deficiency 10/16/2019   Elevated LFTs 10/16/2019   Asthma, exogenous, unspecified asthma severity, uncomplicated 38/25/0539   B12 deficiency anemia 06/21/2018   Venous insufficiency 04/23/2018   Onychomycosis of toenail 04/17/2018   Chronic myofascial pain 03/18/2018   Small fiber neuropathy 03/18/2018   Idiopathic small fiber sensory neuropathy 02/23/2018    Varicose veins of both lower extremities 01/13/2018   Cervical spondylosis without myelopathy 08/28/2017   Migraine with aura and without status migrainosus, not intractable 07/22/2017   Lumbar degenerative disc disease 07/20/2017   Spondylosis of lumbar spine 07/20/2017   Coccydynia 03/12/2017   Bariatric surgery status 05/02/2016   Major depressive disorder, recurrent episode, moderate ([place]) 02/18/2016   Tachy-brady syndrome ([place]) 12/19/2015   Iron deficiency anemia 06/10/2015   Sleep disorder 06/01/2013   Generalized anxiety disorder 05/30/2013   Chronic headaches 07/01/2012   Headache 07/01/2012   Depression, major, recurrent, severe with psychosis ([place]) 06/17/2012   Severe recurrent major depressive disorder with psychotic symptoms ([place]) 06/17/2012   Family history of colonic polyps 03/03/2012   Eating disorder 12/18/2011   Nausea & vomiting 01/22/2010   Polycystic ovarian syndrome 09/01/2008   Intermittent left-sided chest pain 08/02/2008    REFERRING DIAG: M25.511,G89.29,M25.512 (ICD-10-CM) - Chronic pain of both shoulders S76.012A (ICD-10-CM) - Tear of left gluteus medius tendon, initial encounter  THERAPY DIAG:  Abnormal posture  Muscle weakness (generalized)  Other muscle spasm  Unspecified lack of coordination  Rationale for Evaluation and Treatment Rehabilitation  PERTINENT HISTORY: Breast augmentation surgeries with follow-up reduction with complication, c/s, gastric bypass, bil tubal ligation, long history of chronic low back/pelvic pain, pudendal neuralgia, coccydynia, Lt glute med repair  PRECAUTIONS: NA  SUBJECTIVE:                                                                                                                                                                                      SUBJECTIVE STATEMENT:  Pt states that she started having stomach issues last week, possibly due to steroids  and having stomach ulcer. Due to this, she was not able to  perform as many exercises as she wanted to do. She was able to try some of the new exercises. Lt hip is bothering her more due to having to lie down more. Lt labial pain is better, but still is bothering her.    PAIN:  Are you having pain? Yes, 6/10, Lt hip and low back   03/03/22 SUBJECTIVE STATEMENT: Pt had surgery for Lt glute med tear that most likely happened during fall 3 years ago. She has still had pain in Lt hip, but she is in rehab due to it. She had meningitis in June and ended up having Bil frozen shoulders. Her biggest complaint is of back pain, which impacts her bil shoulder pain because she has to lay on either side. She mostly lies on Rt side, which causes pain in Rt low back and Rt hip. She is rarely getting pressure off of buttocks. She continues to have sacral/coccydynia with sitting and avoids this position. She is not currently having vaginal/pelvic pain, but states this is coming and going - she manages by decreasing frequency of intercourse - no 1x/week. She is having issue with Lt kidney, but MD states that urinary issues are not related.  Is currently in PT for hip and shoulders - primary working on shoulders. Is only doing some basic leg strengthening and shoulder exercises at home.  Fluid intake: Yes: -     PAIN:  Are you having pain? Yes NPRS scale: 8/10Right and Left Pain location:    Pain type: sharp and pressure Pain description: constant    Aggravating factors: sitting, driving, finding comfortable position Relieving factors: lying on stomach relieves buttock pain if she can, but shoulders prevent this   PRECAUTIONS: None   WEIGHT BEARING RESTRICTIONS: No   FALLS:  Has patient fallen in last 6 months? No   LIVING ENVIRONMENT: Lives with: lives with their family Lives in: House/apartment   OCCUPATION: disabled   PLOF: Independent   PATIENT GOALS: would like to be able to sit; decrease bil hip/low back pain   PERTINENT HISTORY:  Breast augmentation  surgeries with follow-up reduction with complication, c/s, gastric bypass, bil tubal ligation, long history of chronic low back/pelvic pain, pudendal neuralgia, coccydynia, Lt glute med repair Sexual abuse: No   BOWEL MOVEMENT: Pain with bowel movement: No Type of bowel movement:Frequency 1x/day and Strain Yes Fully empty rectum: Yes: - Leakage: No Pads: No Fiber supplement: No no medication to help stimulate bowel movements   URINATION: Pain with urination: No Fully empty bladder: No Stream:  trouble starting the stream Urgency: no Frequency: every 30 minutes to an hour - worse once she does go, then she has to keep going - easier to just hold it Leakage:  none Pads: No   INTERCOURSE: Pain with intercourse: Initial Penetration and During Penetration Ability to have vaginal penetration:  Yes: pain - only tolerates 1x/week Climax: yes Marinoff Scale: 2/3   PREGNANCY: Vaginal deliveries 0 Tearing No C-section deliveries 2 Currently pregnant No       OBJECTIVE:  03/03/22: DIAGNOSTIC FINDINGS:  Lasix renogram with no abnormal findings   PATIENT SURVEYS:    PFIQ-7 100%   COGNITION: Overall cognitive status: Within functional limits for tasks assessed                          SENSATION: Light touch: Appears intact Proprioception: Appears intact  MUSCLE LENGTH:     FUNCTIONAL TESTS:              5xSTS: 29 seconds   GAIT:   Comments: decreased bil hip extension, posterior pelvic tilt   POSTURE: rounded shoulders, forward head, increased lumbar lordosis, and posterior pelvic tilt   PELVIC ALIGNMENT:   LUMBARAROM/PROM:   A/PROM A/PROM  Eval (%)  Flexion 50, pain on Rt  Extension 20, no pain, very limited mobility  Right lateral flexion 50, pain in Rt low back  Left lateral flexion 75, pain in Rt low back  Right rotation 50, good stretch  Left rotation 50, good stretch   (Blank rows = not tested)     LOWER EXTREMITY MMT: tested in standing due to  comfort             Rt: 3/5 hip abduction and extension, 4/5 adduction and flexion             Lt: 3/5 hip abduction, extension, flexion, 4/5 adduction     PALPATION:   General  tenderness throughout bil lumbar paraspinals and gluts, Lt>Rt; tenderness and spasm in thoracic paraspinals with decreased mobility upon deep breathing; scar tissue restriction under bil breasts that is tender; scar tissue restriction Lt glut med surgical incision                 External Perineal Exam NA                             Internal Pelvic Floor NA   Patient confirms identification and approves PT to assess internal pelvic floor and treatment Yes for future need   PELVIC MMT:   MMT eval  Vaginal    Internal Anal Sphincter    External Anal Sphincter    Puborectalis    Diastasis Recti    (Blank rows = not tested)         TONE: NA   PROLAPSE: NA   TREATMENT 05/19/22 Manual: Negative pressure soft tissue mobilization to Lt hip, glutes, sacral/coccygeal border Soft tissue mobilization to posterolateral hip muscles Scar tissue mobilization to Lt hip incision and surrounding area Soft tissue mobilization to bil lumbar paraspinals and QL Soft tissue mobilization to lateral Lt knee and thigh    05/14/22 Manual: Negative pressure soft tissue mobilization to Lt hip, glutes, sacral/coccygeal border Soft tissue mobilization to posterolateral hip muscles Scar tissue mobilization to Lt hip incision and surrounding area Soft tissue mobilization to bil lumbar paraspinals and QL  Exercises: Cat cow Wagging dog Standing lateral flexion Supine ball reverse table top knee rocks   TREATMENT 04/22/22 Manual: Soft tissue mobilization to posterolateral hip muscles Scar tissue mobilization to Lt hip incision and surrounding area External tailbone muscle attachment release Internal vaginal deep pelvic floor release bil No emotional/communication barriers or cognitive limitation. Patient is motivated  to learn. Patient understands and agrees with treatment goals and plan. PT explains patient will be examined in standing, sitting, and lying down to see how their muscles and joints work. When they are ready, they will be asked to remove their underwear so PT can examine their perineum. The patient is also given the option of providing their own chaperone as one is not provided in our facility. The patient also has the right and is explained the right to defer or refuse any part of the evaluation or treatment including the internal exam. With the patient's consent, PT will use one  gloved finger to gently assess the muscles of the pelvic floor, seeing how well it contracts and relaxes and if there is muscle symmetry. After, the patient will get dressed and PT and patient will discuss exam findings and plan of care. PT and patient discuss plan of care, schedule, attendance policy and HEP activities. External perineal generalized release on Lt, pressing caudally with exhales Soft tissue mobilization to Lt adductors Exercises: Press wall extensions Standing lumbar extensions with support    PATIENT EDUCATION:  Education details: see above self-care Person educated: Patient Education method: Explanation, Demonstration, Tactile cues, Verbal cues, and Handouts Education comprehension: verbalized understanding   HOME EXERCISE PROGRAM: Written handout   ASSESSMENT:   CLINICAL IMPRESSION: Pt had a difficult week with stomach issues, but was still able to perform some of her new exercises with good tolerance. She did well with all manual techniques today; progressed to include soft tissue mobilization to lateral Lt thigh in order to address restriction inferior to greater trochanter in hopes that this will help reduce pain further. We discussed working on lateral step ups at home being aware of knee position and not letting it fall inward. She will continue to benefit from skilled PT intervention in order  to decrease pain, improve functional ability, increase ease of starting stream/emptying bladder, and improve QOL.    OBJECTIVE IMPAIRMENTS: decreased activity tolerance, decreased coordination, decreased endurance, decreased mobility, decreased ROM, decreased strength, hypomobility, increased fascial restrictions, increased muscle spasms, impaired flexibility, impaired tone, postural dysfunction, and pain.    ACTIVITY LIMITATIONS: bending, sitting, standing, stairs, transfers, bed mobility, and locomotion level   PARTICIPATION LIMITATIONS: cleaning, laundry, interpersonal relationship, driving, and community activity   PERSONAL FACTORS: 3+ comorbidities: Breast augmentation surgeries with follow-up reduction with complication, c/s, gastric bypass, bil tubal ligation, long history of chronic low back/pelvic pain, pudendal neuralgia, coccydynia, Lt glut med repair  are also affecting patient's functional outcome.    REHAB POTENTIAL: Good   CLINICAL DECISION MAKING: Stable/uncomplicated   EVALUATION COMPLEXITY: Low     GOALS: Goals reviewed with patient? Yes   SHORT TERM GOALS: Target date: 03/31/2022 - updated 03/26/22 updated 05/14/22   Pt will be independent with HEP.    Baseline: Goal status: MET 03/26/22   2.  Pt will be independent with diaphragmatic breathing and down training activities in order to improve pelvic floor relaxation and abdominal wall mobility.   Baseline:  Goal status: MET 05/14/22   3.  Pt will report improve stream initiation and complete voiding with urination in order to improve QOL. Baseline:  Goal status: IN PROGRESS              4.  Pt will increase all impaired lumbar A/ROM by 25% without pain.   Baseline:  Goal status: IN PROGRESS   LONG TERM GOALS: Target date: 04/28/2022 - updated 03/26/22 - updated 05/14/22   Pt will be independent with advanced HEP.    Baseline:  Goal status:IN PROGRESS   2.  Pt will be able to use regular toilet without  difficulty. Baseline: Only using toilet chair 50% of the time now 05/14/22 Goal status: IN PROGRESS   3.  Pt will be able to ascend/descend 12 steps at regular intervals throughout the day safely and with minimal difficulty.  Baseline:  Goal status: IN PROGRESS   4.  Pt will improve 5xSTS to less than 20 seconds and improved PFIQ by 25% in order to demonstrate improved QOL and functional ability.  Baseline: 28 seconds  Goal status: IN PROGRESS   5.  Patient will report no greater than 5/10 pain with sitting in order to more easily attend appointments, drive, and spend time with family.  Baseline:  Goal status:IN PROGRESS   6.  Pt will demonstrate increase in all impaired hip strength by 1 muscle grades in order to demonstrate improved lumbopelvic support and increase functional ability.      Baseline:  Goal status: IN PROGRESS   PLAN:   PT FREQUENCY: 1x/week   PT DURATION: 8 weeks   PLANNED INTERVENTIONS: Therapeutic exercises, Therapeutic activity, Neuromuscular re-education, Balance training, Gait training, Patient/Family education, Self Care, Joint mobilization, Aquatic Therapy, Dry Needling, Biofeedback, and Manual therapy   PLAN FOR NEXT SESSION: continue manual techniques as needed; progress mobility exercises.    Heather Roberts, PT, DPT01/22/248:45 AM

## 2022-05-20 ENCOUNTER — Other Ambulatory Visit: Payer: Self-pay | Admitting: Family Medicine

## 2022-05-20 ENCOUNTER — Encounter: Payer: Self-pay | Admitting: Family Medicine

## 2022-05-20 MED ORDER — SUCRALFATE 1 G PO TABS: 1.0000 g | ORAL_TABLET | Freq: Three times a day (TID) | ORAL | 0 refills | Status: AC

## 2022-05-21 ENCOUNTER — Ambulatory Visit (INDEPENDENT_AMBULATORY_CARE_PROVIDER_SITE_OTHER): Payer: PPO | Admitting: Psychologist

## 2022-05-21 DIAGNOSIS — F411 Generalized anxiety disorder: Secondary | ICD-10-CM | POA: Diagnosis not present

## 2022-05-21 DIAGNOSIS — F331 Major depressive disorder, recurrent, moderate: Secondary | ICD-10-CM

## 2022-05-21 DIAGNOSIS — F4310 Post-traumatic stress disorder, unspecified: Secondary | ICD-10-CM
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
## 2022-05-21 NOTE — Progress Notes (Signed)
                Errica Dutil, PsyD 

## 2022-05-23 ENCOUNTER — Ambulatory Visit (HOSPITAL_BASED_OUTPATIENT_CLINIC_OR_DEPARTMENT_OTHER): Payer: PPO | Admitting: Physical Therapy

## 2022-05-23 ENCOUNTER — Encounter (HOSPITAL_BASED_OUTPATIENT_CLINIC_OR_DEPARTMENT_OTHER): Payer: Self-pay | Admitting: Physical Therapy

## 2022-05-23 DIAGNOSIS — M62838 Other muscle spasm: Secondary | ICD-10-CM

## 2022-05-23 DIAGNOSIS — R293 Abnormal posture: Secondary | ICD-10-CM | POA: Diagnosis not present

## 2022-05-23 DIAGNOSIS — M6281 Muscle weakness (generalized): Secondary | ICD-10-CM

## 2022-05-23 NOTE — Therapy (Signed)
OUTPATIENT PHYSICAL THERAPY LOWER EXTREMITY TREATMENT   Patient Name: Katelyn [name] MRN: [id number] DOB:June 17, 1975, 47 y.o., female Today's Date: 05/23/2022   PT End of Session - 05/23/22 1014     Visit Number 22    Date for PT Re-Evaluation 06/27/22    Authorization Type HTA    PT Start Time 1014    PT Stop Time 1045    PT Time Calculation (min) 31 min    Activity Tolerance Patient tolerated treatment well    Behavior During Therapy [facility] for tasks assessed/performed                      Past Medical History:  Diagnosis Date   Anemia    Anorexia    Anxiety    Asthma    controlled with meds   Benign juvenile melanoma    Chronic headaches    Colon polyps    found on colonoscopy 09/38/1829   Complication of anesthesia    itching after epidural for c section   Constipation    Depression    Depression    several suicide attempts, hospitaluzed in 2012 for this; hx pf ECT treatments ; pt sees Dr. Adele Schilder pyschiatrist  and doing well on medication   Dysrhythmia    hx of PVC's (pt hasn't seen cardiologist since 2021- no follow up needed)   GERD (gastroesophageal reflux disease)    Headache    Heart murmur    as a child - no one mentions hearing murmur anymore, sometimes it is heard. Echo has been performed 02/03/18   Heartburn    no meds   History of pneumonia    x 3  years ago - no recent problems   Hx of blood clots    Left renal atrophy 12/19/2021   Neuromuscular disorder (HCC)    small fiber neuropathy   Obesity    Pneumonia    Polycystic ovary    takes metformin to treat   Renal atrophy, left 2023   Skin lesion    Excisional biopsy of moles - none cancerous   Sleep apnea    yrs ago - diagnosed mild sleep apnea - did not have to use cpap    Past Surgical History:  Procedure Laterality Date   BREAST CAPSULECTOMY Bilateral 12/13/2020   1 week later pt had hematoma surgery on left breast   BREAST ENHANCEMENT SURGERY Bilateral 02/11/2017   BREATH  TEK H PYLORI N/A 07/22/2013   Procedure: BREATH TEK H PYLORI;  Surgeon: Edward Jolly, MD;  Location: [facility];  Service: General;  Laterality: N/A;   CESAREAN SECTION  2004, 2007   x 2   COLONOSCOPY  2013   CYSTOSCOPY WITH URETHRAL DILATATION  age 66    GASTRIC ROUX-EN-Y N/A 10/31/2013   Procedure: LAPAROSCOPIC ROUX-EN-Y GASTRIC BYPASS WITH UPPER ENDOSCOPY;  Surgeon: Edward Jolly, MD;  Location: [facility];  Service: General;  Laterality: N/A;   GLUTEUS MINIMUS REPAIR Left 05/28/2021   Procedure: Left [place];  Surgeon: Vanetta Mulders, MD;  Location: [facility];  Service: Orthopedics;  Laterality: Left;   ingrown toenail Right    Ingrown nail on great right toe   kidney stent  08/28/2014   mole excision     "benign juvenile melanoma" removed from left leg - inner thigh   POLYPECTOMY     2013   SKIN LESION EXCISION     back   TONSILLECTOMY  age 70   TUBAL  LIGATION  2018   Patient Active Problem List   Diagnosis Date Noted   Left renal atrophy 12/19/2021   Stage 2 chronic kidney disease 11/20/2021   Meningitis 09/27/2021   Epigastric pain 09/27/2021   GERD (gastroesophageal reflux disease) 09/27/2021   Rash and nonspecific skin eruption 09/27/2021   Hypocalcemia 09/27/2021   Chronic pain syndrome 09/27/2021   Sepsis ([place])    Fever 09/25/2021   Tear of left gluteus medius tendon    Asthma 07/18/2020   Vitamin B 12 deficiency 10/16/2019   Elevated LFTs 10/16/2019   Asthma, exogenous, unspecified asthma severity, uncomplicated 29/92/4268   B12 deficiency anemia 06/21/2018   Venous insufficiency 04/23/2018   Onychomycosis of toenail 04/17/2018   Chronic myofascial pain 03/18/2018   Small fiber neuropathy 03/18/2018   Idiopathic small fiber sensory neuropathy 02/23/2018   Varicose veins of both lower extremities 01/13/2018   Cervical spondylosis without myelopathy 08/28/2017   Migraine with aura and without status migrainosus, not intractable 07/22/2017    Lumbar degenerative disc disease 07/20/2017   Spondylosis of lumbar spine 07/20/2017   Coccydynia 03/12/2017   Bariatric surgery status 05/02/2016   Major depressive disorder, recurrent episode, moderate ([place]) 02/18/2016   Tachy-brady syndrome ([place]) 12/19/2015   Iron deficiency anemia 06/10/2015   Sleep disorder 06/01/2013   Generalized anxiety disorder 05/30/2013   Chronic headaches 07/01/2012   Headache 07/01/2012   Depression, major, recurrent, severe with psychosis ([place]) 06/17/2012   Severe recurrent major depressive disorder with psychotic symptoms ([place]) 06/17/2012   Family history of colonic polyps 03/03/2012   Eating disorder 12/18/2011   Nausea & vomiting 01/22/2010   Polycystic ovarian syndrome 09/01/2008   Intermittent left-sided chest pain 08/02/2008    REFERRING PROVIDER: Vanetta Mulders, MD  REFERRING DIAG:  M25.511,G89.29,M25.512 (ICD-10-CM) - Chronic pain of both shoulders  S76.012A (ICD-10-CM) - Tear of left gluteus medius tendon, initial encounter  B/L shoulder program ROM and strenghening  L hip strengthening program  THERAPY DIAG:  Abnormal posture  Muscle weakness (generalized)  Other muscle spasm  Rationale for Evaluation and Treatment Rehabilitation  ONSET DATE: most recently June 2023  SUBJECTIVE:   SUBJECTIVE STATEMENT: Pain with pushing a car door closed.    PERTINENT HISTORY: Sensory neuropathy, chronic pain, MDD, DDD  PAIN:  Are you having pain? Yes: NPRS scale: 7-shoulder, 6-hip/10 Pain location: bil shoulder, Lt hip Pain description: see subj Aggravating factors: see subj Relieving factors: see subj  PRECAUTIONS: Other: sensory neropathy Does not tolerate seated or supine positioning well.   WEIGHT BEARING RESTRICTIONS No  FALLS:  Has patient fallen in last 6 months? No  LIVING ENVIRONMENT: Lives with: lives with their family  OCCUPATION: not working  PLOF: Independent with basic ADLs and Independent with community  mobility without device  PATIENT GOALS decr pain, incr ability and stamina, out of the bed more, up steps, dressing    OBJECTIVE:   DIAGNOSTIC FINDINGS: bil shoulder xrays unremarkable on 12/06/21  MRI 03/30/22 IMPRESSION: 1. Mild tendinosis of the supraspinatus tendon with a tiny interstitial tear. 2. Mild tendinosis of the infraspinatus tendon. 3. Mild tendinosis of the intra-articular portion of the long head of the biceps tendon adjacent to the bicipital-labral complex.  PATIENT SURVEYS:  FOTO Hip 40, Shoulder 45 FOTO 04/04/22: hip 43, shoulder 48  COGNITION:  Overall cognitive status: Within functional limits for tasks assessed     SENSATION: Lt UE neuropathy-painful to touch, light sensation in Rt UE LLE neuropathy, both feet have a little bit  POSTURE:  12/22 noted that pt stands at midline with frequent swaying equal Rt to Lt  PALPATION: 12/22: pt presents with gross hypersensitivity but still very tender around hip and pelvis.   LOWER EXTREMITY ROM:  Active ROM- in standing Right eval Left eval Rt/Lt 12/8  Hip flexion 78 74 82/70  Hip extension 14 20 40/34  Hip abduction 20 18 30/20   (Blank rows = not tested)  Shoulder ROM:  1/12- prone ER at 60 deg abd-   36 passive ER is tolerable, able to passively achieve 50 with pain; actively to 42 deg  FUNCTIONAL TESTS:  6MWT 1033 ft    RPE: 8/10; pain up to 8/10 on both sides (Lt more than Rt)  12/29 998 ft   RPE 6/10; pain up to 6/10- mostly on left side (just a little on right)  GAIT: Distance walked: Rt trendelenburg- pt verbalizes sensation of instability Assistive device utilized: none at eval  AROM Right EVAL  Left EVAL Rt/Lt 12/8 Rt/Lt 1/19  Shoulder flexion 110 112 130/100 144/115  Shoulder extension 26 22 32/30   Shoulder abduction 108 100 120/80 124/110  Shoulder adduction      Shoulder internal rotation Below Sacrum mid buttock Just past neutral not behind body To ipsilateral sacral border/to  midline of buttock Rt- ipsilat SIJ Lt-mid ipsilat buttock   Shoulder external rotation t2 c5 T2/C4 C6/T4  (Blank rows = not tested)  UEROM: Unable to hook bra behind back Difficulty shaving under arms, brushing hair, crossing arms hurts, unable to reach over either shoulder. *pt verbalized numbness in feet with end range overpressure in standing shoulder flexion   TODAY'S TREATMENT:  Treatment                            05/23/22:  + belly press test for subscap on Left side.  Prone rib mobs, periscap STM on right Adapted W with cane to supinated & elbows forward Lt GHJ AP mobs grade 4, instanding against wall- able to reduce sharp pinch on post shoulder Wall walk with scap retraction at 90 deg forward flexion Small range IYT (to 45 deg) with 1lb in hand    Treatment                            05/16/22:  Prone manual rib mobilization, STM to Lt upper trap, levator & periscapular region, assisted scapular retraction Pulleys into flexion AAROM flexion with wand, W with wand Cross body adduction stretch Door pec stretch    05/09/22:  Trigger Point Dry Needling, Manual Therapy Treatment:  Initial or subsequent education regarding Trigger Point Dry Needling: Subsequent Did patient give consent to treatment with Trigger Point Dry Needling: Yes TPDN with skilled palpation and monitoring followed by STM to the following muscles: Lt upper trap & levator Prone mobs: shoulder abducted to 60 deg with towel roll under shoulder/arm; inf & PA mobs to GHJ, inf first rib mobs, gross rib ER mobs  Paired mobs with passive MWM of GHJ ER Prone active ER from position listed above Standing single arm W- PT tactile cues for scapular motion     PATIENT EDUCATION:  Education details: Geophysicist/field seismologist of condition, POC, HEP, exercise form/rationale Person educated: Patient Education method: Explanation, Demonstration, Tactile cues, Verbal cues, and Handouts Education comprehension: verbalized  understanding, returned demonstration, verbal cues required, tactile cues required, and needs further education   HOME EXERCISE PROGRAM: [id number]  ASSESSMENT:  CLINICAL IMPRESSION: Session ended slightly early today due to feet becoming very hot and unable to tolerate further standing or exercise, this is from another medical issue that is unrelated to the shoulder.  Overall shoulder range of motion continues to improve as well as decrease in overall pain levels able to reduce posterior impingement with mobilizations and standing today.  Signs and symptoms consistent with subscapularis irritation.  Past MRI shows that it is intact and will continue to monitor pain levels here.  OBJECTIVE IMPAIRMENTS decreased activity tolerance, difficulty walking, decreased ROM, decreased strength, increased muscle spasms, impaired flexibility, impaired sensation, improper body mechanics, postural dysfunction, and pain.   ACTIVITY LIMITATIONS carrying, lifting, bending, sitting, standing, squatting, sleeping, stairs, transfers, bed mobility, bathing, toileting, dressing, reach over head, hygiene/grooming, locomotion level, and caring for others  PARTICIPATION LIMITATIONS: meal prep, cleaning, laundry, driving, shopping, and community activity  PERSONAL FACTORS 3+ comorbidities: see PMH  are also affecting patient's functional outcome.     GOALS: Goals reviewed with patient? Yes  SHORT TERM GOALS: Target date: 02/21/2022  GHJ IR to midline bilaterally Baseline: Goal status: achieved when done passively, pt reported Lt AC joint pain  2.  Pt will verbalize awareness of equal WB and more frequent corrections Baseline:  Goal status: achieved  3.  Independent in HEP as it has been established in the short term Baseline:  Goal status: achieved   LONG TERM GOALS: Target date: POC date  Pt will meet FOTO goals for hip and shoulder Baseline:  Goal status: ongoing  2.  6 MWT to distance greater than  50-59 age normative values Baseline: 54' on 12/29 Goal status:  3.  Able to fix hair independently pain <=3/10 Baseline: severe pain still ongoing but I feel like I can move it better Goal status:ongoing  4.  Able to dress with minimal difficulty for shoulders and hip Baseline:  Goal status: ongoing  5.  Able to navigate stairs with reciprocal pattern Baseline:  Goal status: ongoing  6.  Able to tolerate ADLs without requiring long periods in her bed due to pain Baseline:  Goal status:ongoing   PLAN: PT FREQUENCY: 1-2x/week  PT DURATION: 10 weeks  PLANNED INTERVENTIONS: Therapeutic exercises, Therapeutic activity, Neuromuscular re-education, Balance training, Gait training, Patient/Family education, Self Care, Joint mobilization, Stair training, Aquatic Therapy, Dry Needling, Electrical stimulation, Spinal mobilization, Cryotherapy, Moist heat, Taping, Manual therapy, and Re-evaluation  PLAN FOR NEXT SESSION: continue prone manual work, mobility as tolerated.   Dyllon Henken C. Keeana Pieratt PT, DPT 05/23/22 10:51 AM

## 2022-05-26 ENCOUNTER — Ambulatory Visit: Payer: PPO

## 2022-05-26 DIAGNOSIS — R293 Abnormal posture: Secondary | ICD-10-CM | POA: Diagnosis not present

## 2022-05-26 DIAGNOSIS — M62838 Other muscle spasm: Secondary | ICD-10-CM

## 2022-05-26 DIAGNOSIS — R279 Unspecified lack of coordination: Secondary | ICD-10-CM

## 2022-05-26 DIAGNOSIS — M6281 Muscle weakness (generalized): Secondary | ICD-10-CM

## 2022-05-26 DIAGNOSIS — M25552 Pain in left hip: Secondary | ICD-10-CM

## 2022-05-26 NOTE — Therapy (Signed)
OUTPATIENT PHYSICAL THERAPY TREATMENT NOTE   Reporting Period 03/03/22 to 03/26/22  See note below for Objective Data and Assessment of Progress/Goals.    Patient Name: Katelyn [name] MRN: [id number] DOB:Feb 02, 1976, 47 y.o., female Today's Date: 05/26/2022  PCP: Kathyrn Drown, MD  REFERRING PROVIDER: Vira Agar, MD  END OF SESSION:   PT End of Session - 05/26/22 0759     Visit Number 23    Date for PT Re-Evaluation 06/27/22    Authorization Type HTA    PT Start Time 0800    PT Stop Time 0840    PT Time Calculation (min) 40 min    Activity Tolerance Patient tolerated treatment well    Behavior During Therapy [facility] for tasks assessed/performed              Past Medical History:  Diagnosis Date   Anemia    Anorexia    Anxiety    Asthma    controlled with meds   Benign juvenile melanoma    Chronic headaches    Colon polyps    found on colonoscopy 16/10/3708   Complication of anesthesia    itching after epidural for c section   Constipation    Depression    Depression    several suicide attempts, hospitaluzed in 2012 for this; hx pf ECT treatments ; pt sees Dr. Adele Schilder pyschiatrist  and doing well on medication   Dysrhythmia    hx of PVC's (pt hasn't seen cardiologist since 2021- no follow up needed)   GERD (gastroesophageal reflux disease)    Headache    Heart murmur    as a child - no one mentions hearing murmur anymore, sometimes it is heard. Echo has been performed 02/03/18   Heartburn    no meds   History of pneumonia    x 3  years ago - no recent problems   Hx of blood clots    Left renal atrophy 12/19/2021   Neuromuscular disorder (HCC)    small fiber neuropathy   Obesity    Pneumonia    Polycystic ovary    takes metformin to treat   Renal atrophy, left 2023   Skin lesion    Excisional biopsy of moles - none cancerous   Sleep apnea    yrs ago - diagnosed mild sleep apnea - did not have to use cpap    Past Surgical History:  Procedure  Laterality Date   BREAST CAPSULECTOMY Bilateral 12/13/2020   1 week later pt had hematoma surgery on left breast   BREAST ENHANCEMENT SURGERY Bilateral 02/11/2017   BREATH TEK H PYLORI N/A 07/22/2013   Procedure: BREATH TEK H PYLORI;  Surgeon: Edward Jolly, MD;  Location: [facility];  Service: General;  Laterality: N/A;   CESAREAN SECTION  2004, 2007   x 2   COLONOSCOPY  2013   CYSTOSCOPY WITH URETHRAL DILATATION  age 24    GASTRIC ROUX-EN-Y N/A 10/31/2013   Procedure: LAPAROSCOPIC ROUX-EN-Y GASTRIC BYPASS WITH UPPER ENDOSCOPY;  Surgeon: Edward Jolly, MD;  Location: [facility];  Service: General;  Laterality: N/A;   GLUTEUS MINIMUS REPAIR Left 05/28/2021   Procedure: Left [place];  Surgeon: Vanetta Mulders, MD;  Location: [place];  Service: Orthopedics;  Laterality: Left;   ingrown toenail Right    Ingrown nail on great right toe   kidney stent  08/28/2014   mole excision     "benign juvenile melanoma" removed from left leg - inner thigh  POLYPECTOMY     2013   SKIN LESION EXCISION     back   TONSILLECTOMY  age 89   TUBAL LIGATION  2018   Patient Active Problem List   Diagnosis Date Noted   Left renal atrophy 12/19/2021   Stage 2 chronic kidney disease 11/20/2021   Meningitis 09/27/2021   Epigastric pain 09/27/2021   GERD (gastroesophageal reflux disease) 09/27/2021   Rash and nonspecific skin eruption 09/27/2021   Hypocalcemia 09/27/2021   Chronic pain syndrome 09/27/2021   Sepsis ([place])    Fever 09/25/2021   Tear of left gluteus medius tendon    Asthma 07/18/2020   Vitamin B 12 deficiency 10/16/2019   Elevated LFTs 10/16/2019   Asthma, exogenous, unspecified asthma severity, uncomplicated 56/38/7564   B12 deficiency anemia 06/21/2018   Venous insufficiency 04/23/2018   Onychomycosis of toenail 04/17/2018   Chronic myofascial pain 03/18/2018   Small fiber neuropathy 03/18/2018   Idiopathic small fiber sensory neuropathy 02/23/2018   Varicose  veins of both lower extremities 01/13/2018   Cervical spondylosis without myelopathy 08/28/2017   Migraine with aura and without status migrainosus, not intractable 07/22/2017   Lumbar degenerative disc disease 07/20/2017   Spondylosis of lumbar spine 07/20/2017   Coccydynia 03/12/2017   Bariatric surgery status 05/02/2016   Major depressive disorder, recurrent episode, moderate ([place]) 02/18/2016   Tachy-brady syndrome ([place]) 12/19/2015   Iron deficiency anemia 06/10/2015   Sleep disorder 06/01/2013   Generalized anxiety disorder 05/30/2013   Chronic headaches 07/01/2012   Headache 07/01/2012   Depression, major, recurrent, severe with psychosis ([place]) 06/17/2012   Severe recurrent major depressive disorder with psychotic symptoms ([place]) 06/17/2012   Family history of colonic polyps 03/03/2012   Eating disorder 12/18/2011   Nausea & vomiting 01/22/2010   Polycystic ovarian syndrome 09/01/2008   Intermittent left-sided chest pain 08/02/2008    REFERRING DIAG: M25.511,G89.29,M25.512 (ICD-10-CM) - Chronic pain of both shoulders S76.012A (ICD-10-CM) - Tear of left gluteus medius tendon, initial encounter  THERAPY DIAG:  Abnormal posture  Muscle weakness (generalized)  Other muscle spasm  Unspecified lack of coordination  Pain in left hip  Rationale for Evaluation and Treatment Rehabilitation  PERTINENT HISTORY: Breast augmentation surgeries with follow-up reduction with complication, c/s, gastric bypass, bil tubal ligation, long history of chronic low back/pelvic pain, pudendal neuralgia, coccydynia, Lt glute med repair  PRECAUTIONS: NA  SUBJECTIVE:                                                                                                                                                                                      SUBJECTIVE STATEMENT:  Pt states that not very long after our last  session she started having more of the Lt labial pain that comes and goes in intensity.  She felt like work down her Lt lateral thigh was beneficial and her low back is feeling a little better.    PAIN:  Are you having pain? Yes, 7/10, Lt hip/labia   03/03/22 SUBJECTIVE STATEMENT: Pt had surgery for Lt glute med tear that most likely happened during fall 3 years ago. She has still had pain in Lt hip, but she is in rehab due to it. She had meningitis in June and ended up having Bil frozen shoulders. Her biggest complaint is of back pain, which impacts her bil shoulder pain because she has to lay on either side. She mostly lies on Rt side, which causes pain in Rt low back and Rt hip. She is rarely getting pressure off of buttocks. She continues to have sacral/coccydynia with sitting and avoids this position. She is not currently having vaginal/pelvic pain, but states this is coming and going - she manages by decreasing frequency of intercourse - no 1x/week. She is having issue with Lt kidney, but MD states that urinary issues are not related.  Is currently in PT for hip and shoulders - primary working on shoulders. Is only doing some basic leg strengthening and shoulder exercises at home.  Fluid intake: Yes: -     PAIN:  Are you having pain? Yes NPRS scale: 8/10Right and Left Pain location:    Pain type: sharp and pressure Pain description: constant    Aggravating factors: sitting, driving, finding comfortable position Relieving factors: lying on stomach relieves buttock pain if she can, but shoulders prevent this   PRECAUTIONS: None   WEIGHT BEARING RESTRICTIONS: No   FALLS:  Has patient fallen in last 6 months? No   LIVING ENVIRONMENT: Lives with: lives with their family Lives in: House/apartment   OCCUPATION: disabled   PLOF: Independent   PATIENT GOALS: would like to be able to sit; decrease bil hip/low back pain   PERTINENT HISTORY:  Breast augmentation surgeries with follow-up reduction with complication, c/s, gastric bypass, bil tubal ligation, long history of  chronic low back/pelvic pain, pudendal neuralgia, coccydynia, Lt glute med repair Sexual abuse: No   BOWEL MOVEMENT: Pain with bowel movement: No Type of bowel movement:Frequency 1x/day and Strain Yes Fully empty rectum: Yes: - Leakage: No Pads: No Fiber supplement: No no medication to help stimulate bowel movements   URINATION: Pain with urination: No Fully empty bladder: No Stream:  trouble starting the stream Urgency: no Frequency: every 30 minutes to an hour - worse once she does go, then she has to keep going - easier to just hold it Leakage:  none Pads: No   INTERCOURSE: Pain with intercourse: Initial Penetration and During Penetration Ability to have vaginal penetration:  Yes: pain - only tolerates 1x/week Climax: yes Marinoff Scale: 2/3   PREGNANCY: Vaginal deliveries 0 Tearing No C-section deliveries 2 Currently pregnant No       OBJECTIVE:  03/03/22: DIAGNOSTIC FINDINGS:  Lasix renogram with no abnormal findings   PATIENT SURVEYS:    PFIQ-7 100%   COGNITION: Overall cognitive status: Within functional limits for tasks assessed                          SENSATION: Light touch: Appears intact Proprioception: Appears intact   MUSCLE LENGTH:     FUNCTIONAL TESTS:  5xSTS: 29 seconds   GAIT:   Comments: decreased bil hip extension, posterior pelvic tilt   POSTURE: rounded shoulders, forward head, increased lumbar lordosis, and posterior pelvic tilt   PELVIC ALIGNMENT:   LUMBARAROM/PROM:   A/PROM A/PROM  Eval (%)  Flexion 50, pain on Rt  Extension 20, no pain, very limited mobility  Right lateral flexion 50, pain in Rt low back  Left lateral flexion 75, pain in Rt low back  Right rotation 50, good stretch  Left rotation 50, good stretch   (Blank rows = not tested)     LOWER EXTREMITY MMT: tested in standing due to comfort             Rt: 3/5 hip abduction and extension, 4/5 adduction and flexion             Lt: 3/5 hip  abduction, extension, flexion, 4/5 adduction     PALPATION:   General  tenderness throughout bil lumbar paraspinals and gluts, Lt>Rt; tenderness and spasm in thoracic paraspinals with decreased mobility upon deep breathing; scar tissue restriction under bil breasts that is tender; scar tissue restriction Lt glut med surgical incision                 External Perineal Exam NA                             Internal Pelvic Floor NA   Patient confirms identification and approves PT to assess internal pelvic floor and treatment Yes for future need   PELVIC MMT:   MMT eval  Vaginal    Internal Anal Sphincter    External Anal Sphincter    Puborectalis    Diastasis Recti    (Blank rows = not tested)         TONE: NA   PROLAPSE: NA   TREATMENT 05/26/22 Manual: Negative pressure soft tissue mobilization to Lt hip, glutes, sacral/coccygeal border Soft tissue mobilization to posterolateral hip muscles Scar tissue mobilization to Lt hip incision and surrounding area Soft tissue mobilization to lateral Lt knee and thigh Lt adductor/adductor attachment release   TREATMENT 05/19/22 Manual: Negative pressure soft tissue mobilization to Lt hip, glutes, sacral/coccygeal border Soft tissue mobilization to posterolateral hip muscles Scar tissue mobilization to Lt hip incision and surrounding area Soft tissue mobilization to bil lumbar paraspinals and QL Soft tissue mobilization to lateral Lt knee and thigh    05/14/22 Manual: Negative pressure soft tissue mobilization to Lt hip, glutes, sacral/coccygeal border Soft tissue mobilization to posterolateral hip muscles Scar tissue mobilization to Lt hip incision and surrounding area Soft tissue mobilization to bil lumbar paraspinals and QL  Exercises: Cat cow Wagging dog Standing lateral flexion Supine ball reverse table top knee rocks     PATIENT EDUCATION:  Education details: see above self-care Person educated: Patient Education  method: Consulting civil engineer, Media planner, Corporate treasurer cues, Verbal cues, and Handouts Education comprehension: verbalized understanding   HOME EXERCISE PROGRAM: Written handout   ASSESSMENT:   CLINICAL IMPRESSION: [name] low back feeling a little better today, but has had exacerbation of labial pain. This is likely due to increased pelvic floor tension, which also is consistent with increase in dyspareunia this last week. Good tolerance to all manual techniques. We discussed modifying counter top child's pose to include bil hip internal rotation to help decompress pudendal nerve. She will continue to benefit from skilled PT intervention in order to decrease pain, improve functional ability, increase ease  of starting stream/emptying bladder, and improve QOL.    OBJECTIVE IMPAIRMENTS: decreased activity tolerance, decreased coordination, decreased endurance, decreased mobility, decreased ROM, decreased strength, hypomobility, increased fascial restrictions, increased muscle spasms, impaired flexibility, impaired tone, postural dysfunction, and pain.    ACTIVITY LIMITATIONS: bending, sitting, standing, stairs, transfers, bed mobility, and locomotion level   PARTICIPATION LIMITATIONS: cleaning, laundry, interpersonal relationship, driving, and community activity   PERSONAL FACTORS: 3+ comorbidities: Breast augmentation surgeries with follow-up reduction with complication, c/s, gastric bypass, bil tubal ligation, long history of chronic low back/pelvic pain, pudendal neuralgia, coccydynia, Lt glut med repair  are also affecting patient's functional outcome.    REHAB POTENTIAL: Good   CLINICAL DECISION MAKING: Stable/uncomplicated   EVALUATION COMPLEXITY: Low     GOALS: Goals reviewed with patient? Yes   SHORT TERM GOALS: Target date: 03/31/2022 - updated 03/26/22 updated 05/14/22   Pt will be independent with HEP.    Baseline: Goal status: MET 03/26/22   2.  Pt will be independent with diaphragmatic  breathing and down training activities in order to improve pelvic floor relaxation and abdominal wall mobility.   Baseline:  Goal status: MET 05/14/22   3.  Pt will report improve stream initiation and complete voiding with urination in order to improve QOL. Baseline:  Goal status: IN PROGRESS              4.  Pt will increase all impaired lumbar A/ROM by 25% without pain.   Baseline:  Goal status: IN PROGRESS   LONG TERM GOALS: Target date: 04/28/2022 - updated 03/26/22 - updated 05/14/22   Pt will be independent with advanced HEP.    Baseline:  Goal status:IN PROGRESS   2.  Pt will be able to use regular toilet without difficulty. Baseline: Only using toilet chair 50% of the time now 05/14/22 Goal status: IN PROGRESS   3.  Pt will be able to ascend/descend 12 steps at regular intervals throughout the day safely and with minimal difficulty.  Baseline:  Goal status: IN PROGRESS   4.  Pt will improve 5xSTS to less than 20 seconds and improved PFIQ by 25% in order to demonstrate improved QOL and functional ability.  Baseline: 28 seconds Goal status: IN PROGRESS   5.  Patient will report no greater than 5/10 pain with sitting in order to more easily attend appointments, drive, and spend time with family.  Baseline:  Goal status:IN PROGRESS   6.  Pt will demonstrate increase in all impaired hip strength by 1 muscle grades in order to demonstrate improved lumbopelvic support and increase functional ability.      Baseline:  Goal status: IN PROGRESS   PLAN:   PT FREQUENCY: 1x/week   PT DURATION: 8 weeks   PLANNED INTERVENTIONS: Therapeutic exercises, Therapeutic activity, Neuromuscular re-education, Balance training, Gait training, Patient/Family education, Self Care, Joint mobilization, Aquatic Therapy, Dry Needling, Biofeedback, and Manual therapy   PLAN FOR NEXT SESSION: continue manual techniques as needed; progress mobility exercises.    Heather Roberts, PT,  DPT01/29/248:45 AM

## 2022-05-27 ENCOUNTER — Ambulatory Visit (HOSPITAL_BASED_OUTPATIENT_CLINIC_OR_DEPARTMENT_OTHER): Payer: PPO | Admitting: Physical Therapy

## 2022-05-29 DIAGNOSIS — F5001 Anorexia nervosa, restricting type: Secondary | ICD-10-CM | POA: Diagnosis not present

## 2022-05-29 DIAGNOSIS — F411 Generalized anxiety disorder: Secondary | ICD-10-CM | POA: Diagnosis not present

## 2022-05-29 DIAGNOSIS — F331 Major depressive disorder, recurrent, moderate: Secondary | ICD-10-CM | POA: Diagnosis not present

## 2022-05-30 ENCOUNTER — Ambulatory Visit (HOSPITAL_BASED_OUTPATIENT_CLINIC_OR_DEPARTMENT_OTHER): Payer: PPO | Attending: Orthopaedic Surgery | Admitting: Physical Therapy

## 2022-05-30 ENCOUNTER — Encounter (HOSPITAL_BASED_OUTPATIENT_CLINIC_OR_DEPARTMENT_OTHER): Payer: Self-pay | Admitting: Physical Therapy

## 2022-05-30 DIAGNOSIS — R293 Abnormal posture: Secondary | ICD-10-CM | POA: Diagnosis not present

## 2022-05-30 DIAGNOSIS — M62838 Other muscle spasm: Secondary | ICD-10-CM

## 2022-05-30 DIAGNOSIS — M6281 Muscle weakness (generalized): Secondary | ICD-10-CM

## 2022-05-30 NOTE — Therapy (Signed)
OUTPATIENT PHYSICAL THERAPY LOWER EXTREMITY TREATMENT   Patient Name: Katelyn [name] MRN: [id number] DOB:[birth year], 47 y.o., female Today's Date: 05/30/2022   PT End of Session - 05/30/22 0933     Visit Number 24    Date for PT Re-Evaluation 06/27/22    Authorization Type HTA    PT Start Time 0930    PT Stop Time 1012    PT Time Calculation (min) 42 min    Activity Tolerance Patient tolerated treatment well    Behavior During Therapy [facility] for tasks assessed/performed                      Past Medical History:  Diagnosis Date   Anemia    Anorexia    Anxiety    Asthma    controlled with meds   Benign juvenile melanoma    Chronic headaches    Colon polyps    found on colonoscopy 08/65/7846   Complication of anesthesia    itching after epidural for c section   Constipation    Depression    Depression    several suicide attempts, hospitaluzed in 2012 for this; hx pf ECT treatments ; pt sees Dr. Adele Schilder pyschiatrist  and doing well on medication   Dysrhythmia    hx of PVC's (pt hasn't seen cardiologist since 2021- no follow up needed)   GERD (gastroesophageal reflux disease)    Headache    Heart murmur    as a child - no one mentions hearing murmur anymore, sometimes it is heard. Echo has been performed 02/03/18   Heartburn    no meds   History of pneumonia    x 3  years ago - no recent problems   Hx of blood clots    Left renal atrophy 12/19/2021   Neuromuscular disorder (HCC)    small fiber neuropathy   Obesity    Pneumonia    Polycystic ovary    takes metformin to treat   Renal atrophy, left 2023   Skin lesion    Excisional biopsy of moles - none cancerous   Sleep apnea    yrs ago - diagnosed mild sleep apnea - did not have to use cpap    Past Surgical History:  Procedure Laterality Date   BREAST CAPSULECTOMY Bilateral 12/13/2020   1 week later pt had hematoma surgery on left breast   BREAST ENHANCEMENT SURGERY Bilateral 02/11/2017   BREATH  TEK H PYLORI N/A 07/22/2013   Procedure: BREATH TEK H PYLORI;  Surgeon: Edward Jolly, MD;  Location: [facility];  Service: General;  Laterality: N/A;   CESAREAN SECTION  2004, 2007   x 2   COLONOSCOPY  2013   CYSTOSCOPY WITH URETHRAL DILATATION  age 45    GASTRIC ROUX-EN-Y N/A 10/31/2013   Procedure: LAPAROSCOPIC ROUX-EN-Y GASTRIC BYPASS WITH UPPER ENDOSCOPY;  Surgeon: Edward Jolly, MD;  Location: [facility];  Service: General;  Laterality: N/A;   GLUTEUS MINIMUS REPAIR Left 05/28/2021   Procedure: Left [place];  Surgeon: Vanetta Mulders, MD;  Location: [facility];  Service: Orthopedics;  Laterality: Left;   ingrown toenail Right    Ingrown nail on great right toe   kidney stent  08/28/2014   mole excision     "benign juvenile melanoma" removed from left leg - inner thigh   POLYPECTOMY     2013   SKIN LESION EXCISION     back   TONSILLECTOMY  age 47   TUBAL  LIGATION  2018   Patient Active Problem List   Diagnosis Date Noted   Left renal atrophy 12/19/2021   Stage 2 chronic kidney disease 11/20/2021   Meningitis 09/27/2021   Epigastric pain 09/27/2021   GERD (gastroesophageal reflux disease) 09/27/2021   Rash and nonspecific skin eruption 09/27/2021   Hypocalcemia 09/27/2021   Chronic pain syndrome 09/27/2021   Sepsis ([place])    Fever 09/25/2021   Tear of left gluteus medius tendon    Asthma 07/18/2020   Vitamin B 12 deficiency 10/16/2019   Elevated LFTs 10/16/2019   Asthma, exogenous, unspecified asthma severity, uncomplicated 85/63/1497   B12 deficiency anemia 06/21/2018   Venous insufficiency 04/23/2018   Onychomycosis of toenail 04/17/2018   Chronic myofascial pain 03/18/2018   Small fiber neuropathy 03/18/2018   Idiopathic small fiber sensory neuropathy 02/23/2018   Varicose veins of both lower extremities 01/13/2018   Cervical spondylosis without myelopathy 08/28/2017   Migraine with aura and without status migrainosus, not intractable 07/22/2017    Lumbar degenerative disc disease 07/20/2017   Spondylosis of lumbar spine 07/20/2017   Coccydynia 03/12/2017   Bariatric surgery status 05/02/2016   Major depressive disorder, recurrent episode, moderate ([place]) 02/18/2016   Tachy-brady syndrome ([place]) 12/19/2015   Iron deficiency anemia 06/10/2015   Sleep disorder 06/01/2013   Generalized anxiety disorder 05/30/2013   Chronic headaches 07/01/2012   Headache 07/01/2012   Depression, major, recurrent, severe with psychosis ([place]) 06/17/2012   Severe recurrent major depressive disorder with psychotic symptoms ([place]) 06/17/2012   Family history of colonic polyps 03/03/2012   Eating disorder 12/18/2011   Nausea & vomiting 01/22/2010   Polycystic ovarian syndrome 09/01/2008   Intermittent left-sided chest pain 08/02/2008    REFERRING PROVIDER: Vanetta Mulders, MD  REFERRING DIAG:  M25.511,G89.29,M25.512 (ICD-10-CM) - Chronic pain of both shoulders  S76.012A (ICD-10-CM) - Tear of left gluteus medius tendon, initial encounter  B/L shoulder program ROM and strenghening  L hip strengthening program  THERAPY DIAG:  Abnormal posture  Muscle weakness (generalized)  Other muscle spasm  Rationale for Evaluation and Treatment Rehabilitation  ONSET DATE: most recently June 2023  SUBJECTIVE:   SUBJECTIVE STATEMENT: Lateral left foot/ankle pain. Discomfort at greater trochanter. Notice when I sway side to side I bend right knee but not left. Planning 3 more mo of PT and then possible PRP injections.    PERTINENT HISTORY: Sensory neuropathy, chronic pain, MDD, DDD  PAIN:  Are you having pain? Yes: NPRS scale: 7-shoulder, 6-hip/10 Pain location: bil shoulder, Lt hip Pain description: see subj Aggravating factors: see subj Relieving factors: see subj  PRECAUTIONS: Other: sensory neropathy Does not tolerate seated or supine positioning well.   WEIGHT BEARING RESTRICTIONS No  FALLS:  Has patient fallen in last 6 months?  No  LIVING ENVIRONMENT: Lives with: lives with their family  OCCUPATION: not working  PLOF: Independent with basic ADLs and Independent with community mobility without device  PATIENT GOALS decr pain, incr ability and stamina, out of the bed more, up steps, dressing    OBJECTIVE:   DIAGNOSTIC FINDINGS: bil shoulder xrays unremarkable on 12/06/21  MRI 03/30/22 IMPRESSION: 1. Mild tendinosis of the supraspinatus tendon with a tiny interstitial tear. 2. Mild tendinosis of the infraspinatus tendon. 3. Mild tendinosis of the intra-articular portion of the long head of the biceps tendon adjacent to the bicipital-labral complex.  PATIENT SURVEYS:  FOTO Hip 40, Shoulder 45 FOTO 04/04/22: hip 43, shoulder 48  COGNITION:  Overall cognitive status: Within functional limits for tasks  assessed     SENSATION: Lt UE neuropathy-painful to touch, light sensation in Rt UE LLE neuropathy, both feet have a little bit  POSTURE: 12/22 noted that pt stands at midline with frequent swaying equal Rt to Lt  PALPATION: 12/22: pt presents with gross hypersensitivity but still very tender around hip and pelvis.   LOWER EXTREMITY ROM:  Active ROM- in standing Right eval Left eval Rt/Lt 12/8  Hip flexion 78 74 82/70  Hip extension 14 20 40/34  Hip abduction 20 18 30/20   (Blank rows = not tested)  Shoulder ROM:  1/12- prone ER at 60 deg abd-   36 passive ER is tolerable, able to passively achieve 50 with pain; actively to 42 deg  FUNCTIONAL TESTS:  6MWT 1033 ft    RPE: 8/10; pain up to 8/10 on both sides (Lt more than Rt)  12/29 998 ft   RPE 6/10; pain up to 6/10- mostly on left side (just a little on right)  GAIT: Distance walked: Rt trendelenburg- pt verbalizes sensation of instability Assistive device utilized: none at eval  AROM Right EVAL  Left EVAL Rt/Lt 12/8 Rt/Lt 1/19  Shoulder flexion 110 112 130/100 144/115  Shoulder extension 26 22 32/30   Shoulder abduction 108 100  120/80 124/110  Shoulder adduction      Shoulder internal rotation Below Sacrum mid buttock Just past neutral not behind body To ipsilateral sacral border/to midline of buttock Rt- ipsilat SIJ Lt-mid ipsilat buttock   Shoulder external rotation t2 c5 T2/C4 C6/T4  (Blank rows = not tested)  UEROM: Unable to hook bra behind back Difficulty shaving under arms, brushing hair, crossing arms hurts, unable to reach over either shoulder. *pt verbalized numbness in feet with end range overpressure in standing shoulder flexion   TODAY'S TREATMENT:  Treatment                            2/2:  Side lunges with hands on table Heel raises on wall Backward walking Standing hip hige- active & with elbows on table.  Standing mini lateral rocking with knee unlocking Pec major adapted to be in pronated low, diag stretch Prone manual rib & thoraicc mobs, scapular mobilization on Left, STM to infraspinatus, upper trap, levator   Treatment                            05/23/22:  + belly press test for subscap on Left side.  Prone rib mobs, periscap STM on right Adapted W with cane to supinated & elbows forward Lt GHJ AP mobs grade 4, instanding against wall- able to reduce sharp pinch on post shoulder Wall walk with scap retraction at 90 deg forward flexion Small range IYT (to 45 deg) with 1lb in hand    Treatment                            05/16/22:  Prone manual rib mobilization, STM to Lt upper trap, levator & periscapular region, assisted scapular retraction Pulleys into flexion AAROM flexion with wand, W with wand Cross body adduction stretch Door pec stretch     PATIENT EDUCATION:  Education details: Geophysicist/field seismologist of condition, POC, HEP, exercise form/rationale Person educated: Patient Education method: Explanation, Demonstration, Tactile cues, Verbal cues, and Handouts Education comprehension: verbalized understanding, returned demonstration, verbal cues required, tactile cues required, and  needs  further education   HOME EXERCISE PROGRAM: [id number]  ASSESSMENT:  CLINICAL IMPRESSION: Notable weakness in left lower extremity leading to overuse of ankle mobility in order to avoid knee flexion that requires strength control from quads and hamstrings.  OBJECTIVE IMPAIRMENTS decreased activity tolerance, difficulty walking, decreased ROM, decreased strength, increased muscle spasms, impaired flexibility, impaired sensation, improper body mechanics, postural dysfunction, and pain.   ACTIVITY LIMITATIONS carrying, lifting, bending, sitting, standing, squatting, sleeping, stairs, transfers, bed mobility, bathing, toileting, dressing, reach over head, hygiene/grooming, locomotion level, and caring for others  PARTICIPATION LIMITATIONS: meal prep, cleaning, laundry, driving, shopping, and community activity  PERSONAL FACTORS 3+ comorbidities: see PMH  are also affecting patient's functional outcome.     GOALS: Goals reviewed with patient? Yes  SHORT TERM GOALS: Target date: 02/21/2022  GHJ IR to midline bilaterally Baseline: Goal status: achieved when done passively, pt reported Lt AC joint pain  2.  Pt will verbalize awareness of equal WB and more frequent corrections Baseline:  Goal status: achieved  3.  Independent in HEP as it has been established in the short term Baseline:  Goal status: achieved   LONG TERM GOALS: Target date: POC date  Pt will meet FOTO goals for hip and shoulder Baseline:  Goal status: ongoing  2.  6 MWT to distance greater than 50-59 age normative values Baseline: 63' on 12/29 Goal status:  3.  Able to fix hair independently pain <=3/10 Baseline: severe pain still ongoing but I feel like I can move it better Goal status:ongoing  4.  Able to dress with minimal difficulty for shoulders and hip Baseline:  Goal status: ongoing  5.  Able to navigate stairs with reciprocal pattern Baseline:  Goal status: ongoing  6.  Able to tolerate  ADLs without requiring long periods in her bed due to pain Baseline:  Goal status:ongoing   PLAN: PT FREQUENCY: 1-2x/week  PT DURATION: 10 weeks  PLANNED INTERVENTIONS: Therapeutic exercises, Therapeutic activity, Neuromuscular re-education, Balance training, Gait training, Patient/Family education, Self Care, Joint mobilization, Stair training, Aquatic Therapy, Dry Needling, Electrical stimulation, Spinal mobilization, Cryotherapy, Moist heat, Taping, Manual therapy, and Re-evaluation  PLAN FOR NEXT SESSION: continue prone manual work, mobility as tolerated.   Trace Wirick C. Breyton Vanscyoc PT, DPT 05/30/22 8:15 PM

## 2022-06-02 ENCOUNTER — Ambulatory Visit (INDEPENDENT_AMBULATORY_CARE_PROVIDER_SITE_OTHER): Payer: PPO | Admitting: Family Medicine

## 2022-06-02 VITALS — BP 118/80

## 2022-06-02 DIAGNOSIS — R1012 Left upper quadrant pain: Secondary | ICD-10-CM

## 2022-06-02 DIAGNOSIS — L219 Seborrheic dermatitis, unspecified: Secondary | ICD-10-CM

## 2022-06-02 DIAGNOSIS — D72829 Elevated white blood cell count, unspecified: Secondary | ICD-10-CM | POA: Diagnosis not present

## 2022-06-02 MED ORDER — FLUCONAZOLE 150 MG PO TABS: ORAL_TABLET | ORAL | 2 refills | Status: DC

## 2022-06-02 MED ORDER — DESONIDE 0.05 % EX CREA: TOPICAL_CREAM | CUTANEOUS | 2 refills | Status: AC

## 2022-06-02 NOTE — Progress Notes (Signed)
   Subjective:    Patient ID: Katelyn [name], female    DOB: Jan 01, 1976, 47 y.o.   MRN: [id number]  HPI Patient arrives today for follow up. Very nice patient Here for follow-up regarding her abdomen The area is feeling better but she still has some spells when she eats that will last anywhere from an hour to several hours of sharp pain discomfort that sometimes she has to put pressure on there is some nausea with it but no vomiting.  No reflux symptoms.  No dysphagia.  She has had a gastric bypass in the past no weight loss.  Previous labs showed slight elevation white blood count  She also relates that she has a yeast infection and needs Diflucan today In addition to this she relates some intermittent numbness pins-and-needles feeling on her nose She also relates seborrheic dermatitis on the facial area   Review of Systems     Objective:   Physical Exam Minimal seborrheic dermatitis Facial area looks good         Assessment & Plan:  1. Leukocytosis, unspecified type Minimal elevation.  Patient is concerned because she states her white blood count is never.  She has had previous [facility]up at [facility] which was negative She does follow-up with the [facility] for hematology purposes They will be rechecking her blood counts at the next follow-up if persistent elevated white blood count I am sure they will look into it further-will send notification to Tarri Abernethy PA regarding this issue  2. Seborrheic dermatitis Desonide cream thin amount twice daily as needed  3. LUQ abdominal pain Continue PPI, patient states Carafate helped she is stopped this If she has ongoing trouble neck step would be gastroenterology along with possible reconsult with gastric bypass surgeon-Dr. Excell Seltzer did her surgery but he is passed on Dr. Hassell Done has taken his place.  [place] surgery

## 2022-06-03 ENCOUNTER — Ambulatory Visit: Payer: PPO | Attending: Urology

## 2022-06-03 DIAGNOSIS — M62838 Other muscle spasm: Secondary | ICD-10-CM

## 2022-06-03 DIAGNOSIS — M25552 Pain in left hip: Secondary | ICD-10-CM

## 2022-06-03 DIAGNOSIS — R279 Unspecified lack of coordination: Secondary | ICD-10-CM | POA: Diagnosis not present

## 2022-06-03 DIAGNOSIS — R293 Abnormal posture: Secondary | ICD-10-CM | POA: Diagnosis not present

## 2022-06-03 DIAGNOSIS — M6281 Muscle weakness (generalized): Secondary | ICD-10-CM

## 2022-06-03 NOTE — Therapy (Signed)
OUTPATIENT PHYSICAL THERAPY TREATMENT NOTE   Reporting Period 03/03/22 to 03/26/22  See note below for Objective Data and Assessment of Progress/Goals.    Patient Name: Katelyn [name] MRN: [id number] DOB:1975/07/20, 47 y.o., female Today's Date: 06/03/2022  PCP: Kathyrn Drown, MD  REFERRING PROVIDER: Vira Agar, MD  END OF SESSION:   PT End of Session - 06/03/22 0751     Visit Number 25    Date for PT Re-Evaluation 06/27/22    Authorization Type HTA    PT Start Time 0800    PT Stop Time 0840    PT Time Calculation (min) 40 min    Activity Tolerance Patient tolerated treatment well    Behavior During Therapy [facility] for tasks assessed/performed              Past Medical History:  Diagnosis Date   Anemia    Anorexia    Anxiety    Asthma    controlled with meds   Benign juvenile melanoma    Chronic headaches    Colon polyps    found on colonoscopy 95/18/8416   Complication of anesthesia    itching after epidural for c section   Constipation    Depression    Depression    several suicide attempts, hospitaluzed in 2012 for this; hx pf ECT treatments ; pt sees Dr. Adele Schilder pyschiatrist  and doing well on medication   Dysrhythmia    hx of PVC's (pt hasn't seen cardiologist since 2021- no follow up needed)   GERD (gastroesophageal reflux disease)    Headache    Heart murmur    as a child - no one mentions hearing murmur anymore, sometimes it is heard. Echo has been performed 02/03/18   Heartburn    no meds   History of pneumonia    x 3  years ago - no recent problems   Hx of blood clots    Left renal atrophy 12/19/2021   Neuromuscular disorder (HCC)    small fiber neuropathy   Obesity    Pneumonia    Polycystic ovary    takes metformin to treat   Renal atrophy, left 2023   Skin lesion    Excisional biopsy of moles - none cancerous   Sleep apnea    yrs ago - diagnosed mild sleep apnea - did not have to use cpap    Past Surgical History:  Procedure  Laterality Date   BREAST CAPSULECTOMY Bilateral 12/13/2020   1 week later pt had hematoma surgery on left breast   BREAST ENHANCEMENT SURGERY Bilateral 02/11/2017   BREATH TEK H PYLORI N/A 07/22/2013   Procedure: BREATH TEK H PYLORI;  Surgeon: Edward Jolly, MD;  Location: [facility];  Service: General;  Laterality: N/A;   CESAREAN SECTION  2004, 2007   x 2   COLONOSCOPY  2013   CYSTOSCOPY WITH URETHRAL DILATATION  age 47    GASTRIC ROUX-EN-Y N/A 10/31/2013   Procedure: LAPAROSCOPIC ROUX-EN-Y GASTRIC BYPASS WITH UPPER ENDOSCOPY;  Surgeon: Edward Jolly, MD;  Location: [facility];  Service: General;  Laterality: N/A;   GLUTEUS MINIMUS REPAIR Left 05/28/2021   Procedure: Left [place];  Surgeon: Vanetta Mulders, MD;  Location: [facility];  Service: Orthopedics;  Laterality: Left;   ingrown toenail Right    Ingrown nail on great right toe   kidney stent  08/28/2014   mole excision     "benign juvenile melanoma" removed from left leg - inner thigh  POLYPECTOMY     2013   SKIN LESION EXCISION     back   TONSILLECTOMY  age 47   TUBAL LIGATION  2018   Patient Active Problem List   Diagnosis Date Noted   Left renal atrophy 12/19/2021   Stage 2 chronic kidney disease 11/20/2021   Meningitis 09/27/2021   Epigastric pain 09/27/2021   GERD (gastroesophageal reflux disease) 09/27/2021   Rash and nonspecific skin eruption 09/27/2021   Hypocalcemia 09/27/2021   Chronic pain syndrome 09/27/2021   Sepsis ([place])    Fever 09/25/2021   Tear of left gluteus medius tendon    Asthma 07/18/2020   Vitamin B 12 deficiency 10/16/2019   Elevated LFTs 10/16/2019   Asthma, exogenous, unspecified asthma severity, uncomplicated 11/91/4782   B12 deficiency anemia 06/21/2018   Venous insufficiency 04/23/2018   Onychomycosis of toenail 04/17/2018   Chronic myofascial pain 03/18/2018   Small fiber neuropathy 03/18/2018   Idiopathic small fiber sensory neuropathy 02/23/2018   Varicose  veins of both lower extremities 01/13/2018   Cervical spondylosis without myelopathy 08/28/2017   Migraine with aura and without status migrainosus, not intractable 07/22/2017   Lumbar degenerative disc disease 07/20/2017   Spondylosis of lumbar spine 07/20/2017   Coccydynia 03/12/2017   Bariatric surgery status 05/02/2016   Major depressive disorder, recurrent episode, moderate ([place]) 02/18/2016   Tachy-brady syndrome ([place]) 12/19/2015   Iron deficiency anemia 06/10/2015   Sleep disorder 06/01/2013   Generalized anxiety disorder 05/30/2013   Chronic headaches 07/01/2012   Headache 07/01/2012   Depression, major, recurrent, severe with psychosis ([place]) 06/17/2012   Severe recurrent major depressive disorder with psychotic symptoms ([place]) 06/17/2012   Family history of colonic polyps 03/03/2012   Eating disorder 12/18/2011   Nausea & vomiting 01/22/2010   Polycystic ovarian syndrome 09/01/2008   Intermittent left-sided chest pain 08/02/2008    REFERRING DIAG: M25.511,G89.29,M25.512 (ICD-10-CM) - Chronic pain of both shoulders S76.012A (ICD-10-CM) - Tear of left gluteus medius tendon, initial encounter  THERAPY DIAG:  Abnormal posture  Muscle weakness (generalized)  Other muscle spasm  Unspecified lack of coordination  Pain in left hip  Rationale for Evaluation and Treatment Rehabilitation  PERTINENT HISTORY: Breast augmentation surgeries with follow-up reduction with complication, c/s, gastric bypass, bil tubal ligation, long history of chronic low back/pelvic pain, pudendal neuralgia, coccydynia, Lt glute med repair  PRECAUTIONS: NA  SUBJECTIVE:                                                                                                                                                                                      SUBJECTIVE STATEMENT:  Pt states that she has been having Lt foot  pain. She has started a couple of gentle strength exercises to help Lt LE. Pt is still  working on getting off toilet seat, but she is able to get up the stairs once a day.   PAIN:  Are you having pain? Yes, 7/10, Lt hip/labia   03/03/22 SUBJECTIVE STATEMENT: Pt had surgery for Lt glute med tear that most likely happened during fall 3 years ago. She has still had pain in Lt hip, but she is in rehab due to it. She had meningitis in June and ended up having Bil frozen shoulders. Her biggest complaint is of back pain, which impacts her bil shoulder pain because she has to lay on either side. She mostly lies on Rt side, which causes pain in Rt low back and Rt hip. She is rarely getting pressure off of buttocks. She continues to have sacral/coccydynia with sitting and avoids this position. She is not currently having vaginal/pelvic pain, but states this is coming and going - she manages by decreasing frequency of intercourse - no 1x/week. She is having issue with Lt kidney, but MD states that urinary issues are not related.  Is currently in PT for hip and shoulders - primary working on shoulders. Is only doing some basic leg strengthening and shoulder exercises at home.  Fluid intake: Yes: -     PAIN:  Are you having pain? Yes NPRS scale: 8/10Right and Left Pain location:    Pain type: sharp and pressure Pain description: constant    Aggravating factors: sitting, driving, finding comfortable position Relieving factors: lying on stomach relieves buttock pain if she can, but shoulders prevent this   PRECAUTIONS: None   WEIGHT BEARING RESTRICTIONS: No   FALLS:  Has patient fallen in last 6 months? No   LIVING ENVIRONMENT: Lives with: lives with their family Lives in: House/apartment   OCCUPATION: disabled   PLOF: Independent   PATIENT GOALS: would like to be able to sit; decrease bil hip/low back pain   PERTINENT HISTORY:  Breast augmentation surgeries with follow-up reduction with complication, c/s, gastric bypass, bil tubal ligation, long history of chronic low  back/pelvic pain, pudendal neuralgia, coccydynia, Lt glute med repair Sexual abuse: No   BOWEL MOVEMENT: Pain with bowel movement: No Type of bowel movement:Frequency 1x/day and Strain Yes Fully empty rectum: Yes: - Leakage: No Pads: No Fiber supplement: No no medication to help stimulate bowel movements   URINATION: Pain with urination: No Fully empty bladder: No Stream:  trouble starting the stream Urgency: no Frequency: every 30 minutes to an hour - worse once she does go, then she has to keep going - easier to just hold it Leakage:  none Pads: No   INTERCOURSE: Pain with intercourse: Initial Penetration and During Penetration Ability to have vaginal penetration:  Yes: pain - only tolerates 1x/week Climax: yes Marinoff Scale: 2/3   PREGNANCY: Vaginal deliveries 0 Tearing No C-section deliveries 2 Currently pregnant No       OBJECTIVE:  03/03/22: DIAGNOSTIC FINDINGS:  Lasix renogram with no abnormal findings   PATIENT SURVEYS:    PFIQ-7 100%   COGNITION: Overall cognitive status: Within functional limits for tasks assessed                          SENSATION: Light touch: Appears intact Proprioception: Appears intact   MUSCLE LENGTH:     FUNCTIONAL TESTS:              5xSTS: 29  seconds   GAIT:   Comments: decreased bil hip extension, posterior pelvic tilt   POSTURE: rounded shoulders, forward head, increased lumbar lordosis, and posterior pelvic tilt   PELVIC ALIGNMENT:   LUMBARAROM/PROM:   A/PROM A/PROM  Eval (%)  Flexion 50, pain on Rt  Extension 20, no pain, very limited mobility  Right lateral flexion 50, pain in Rt low back  Left lateral flexion 75, pain in Rt low back  Right rotation 50, good stretch  Left rotation 50, good stretch   (Blank rows = not tested)     LOWER EXTREMITY MMT: tested in standing due to comfort             Rt: 3/5 hip abduction and extension, 4/5 adduction and flexion             Lt: 3/5 hip abduction,  extension, flexion, 4/5 adduction     PALPATION:   General  tenderness throughout bil lumbar paraspinals and gluts, Lt>Rt; tenderness and spasm in thoracic paraspinals with decreased mobility upon deep breathing; scar tissue restriction under bil breasts that is tender; scar tissue restriction Lt glut med surgical incision                 External Perineal Exam NA                             Internal Pelvic Floor NA   Patient confirms identification and approves PT to assess internal pelvic floor and treatment Yes for future need   PELVIC MMT:   MMT eval  Vaginal    Internal Anal Sphincter    External Anal Sphincter    Puborectalis    Diastasis Recti    (Blank rows = not tested)         TONE: NA   PROLAPSE: NA   TREATMENT 06/03/22 Manual: Negative pressure soft tissue mobilization to Lt hip, glutes, sacral/coccygeal border Soft tissue mobilization to posterolateral hip muscles Scar tissue mobilization to Lt hip incision and surrounding area Soft tissue mobilization to lateral Lt knee and thigh Sift tissue mobilization to bil lumbar paraspinals, Rt>Lt Exercises: 3-way kick Marching Heel raises   TREATMENT 05/26/22 Manual: Negative pressure soft tissue mobilization to Lt hip, glutes, sacral/coccygeal border Soft tissue mobilization to posterolateral hip muscles Scar tissue mobilization to Lt hip incision and surrounding area Soft tissue mobilization to lateral Lt knee and thigh Lt adductor/adductor attachment release   TREATMENT 05/19/22 Manual: Negative pressure soft tissue mobilization to Lt hip, glutes, sacral/coccygeal border Soft tissue mobilization to posterolateral hip muscles Scar tissue mobilization to Lt hip incision and surrounding area Soft tissue mobilization to bil lumbar paraspinals and QL Soft tissue mobilization to lateral Lt knee and thigh      PATIENT EDUCATION:  Education details: see above self-care Person educated: Patient Education  method: Consulting civil engineer, Demonstration, Tactile cues, Verbal cues, and Handouts Education comprehension: verbalized understanding   HOME EXERCISE PROGRAM: Written handout   ASSESSMENT:   CLINICAL IMPRESSION: Pt having a harder time with pain today and feels like she has been doing too much in general. We discussed that only getting 2-3 hours of sleep a night is not enough to allow her body to heal. Focus of manual techniques placed on Lt lateral thigh, hip, and Rt low back; she tolerated all treatment well with reduction in pain from 7/10 to 5/10. Due to report of being able to progressing strengthening exercises at home, we progressed HEP  to include hip 3-way. She will continue to benefit from skilled PT intervention in order to decrease pain, improve functional ability, increase ease of starting stream/emptying bladder, and improve QOL.    OBJECTIVE IMPAIRMENTS: decreased activity tolerance, decreased coordination, decreased endurance, decreased mobility, decreased ROM, decreased strength, hypomobility, increased fascial restrictions, increased muscle spasms, impaired flexibility, impaired tone, postural dysfunction, and pain.    ACTIVITY LIMITATIONS: bending, sitting, standing, stairs, transfers, bed mobility, and locomotion level   PARTICIPATION LIMITATIONS: cleaning, laundry, interpersonal relationship, driving, and community activity   PERSONAL FACTORS: 3+ comorbidities: Breast augmentation surgeries with follow-up reduction with complication, c/s, gastric bypass, bil tubal ligation, long history of chronic low back/pelvic pain, pudendal neuralgia, coccydynia, Lt glut med repair  are also affecting patient's functional outcome.    REHAB POTENTIAL: Good   CLINICAL DECISION MAKING: Stable/uncomplicated   EVALUATION COMPLEXITY: Low     GOALS: Goals reviewed with patient? Yes   SHORT TERM GOALS: Target date: 03/31/2022 - updated 03/26/22 updated 05/14/22   Pt will be independent with HEP.     Baseline: Goal status: MET 03/26/22   2.  Pt will be independent with diaphragmatic breathing and down training activities in order to improve pelvic floor relaxation and abdominal wall mobility.   Baseline:  Goal status: MET 05/14/22   3.  Pt will report improve stream initiation and complete voiding with urination in order to improve QOL. Baseline:  Goal status: IN PROGRESS              4.  Pt will increase all impaired lumbar A/ROM by 25% without pain.   Baseline:  Goal status: IN PROGRESS   LONG TERM GOALS: Target date: 04/28/2022 - updated 03/26/22 - updated 05/14/22   Pt will be independent with advanced HEP.    Baseline:  Goal status:IN PROGRESS   2.  Pt will be able to use regular toilet without difficulty. Baseline: Only using toilet chair 50% of the time now 05/14/22 Goal status: IN PROGRESS   3.  Pt will be able to ascend/descend 12 steps at regular intervals throughout the day safely and with minimal difficulty.  Baseline:  Goal status: IN PROGRESS   4.  Pt will improve 5xSTS to less than 20 seconds and improved PFIQ by 25% in order to demonstrate improved QOL and functional ability.  Baseline: 28 seconds Goal status: IN PROGRESS   5.  Patient will report no greater than 5/10 pain with sitting in order to more easily attend appointments, drive, and spend time with family.  Baseline:  Goal status:IN PROGRESS   6.  Pt will demonstrate increase in all impaired hip strength by 1 muscle grades in order to demonstrate improved lumbopelvic support and increase functional ability.      Baseline:  Goal status: IN PROGRESS   PLAN:   PT FREQUENCY: 1x/week   PT DURATION: 8 weeks   PLANNED INTERVENTIONS: Therapeutic exercises, Therapeutic activity, Neuromuscular re-education, Balance training, Gait training, Patient/Family education, Self Care, Joint mobilization, Aquatic Therapy, Dry Needling, Biofeedback, and Manual therapy   PLAN FOR NEXT SESSION: continue  manual techniques as needed; progress mobility exercises.    Heather Roberts, PT, DPT02/06/248:46 AM

## 2022-06-04 ENCOUNTER — Ambulatory Visit: Payer: PPO | Admitting: Psychologist

## 2022-06-04 DIAGNOSIS — F331 Major depressive disorder, recurrent, moderate: Secondary | ICD-10-CM | POA: Diagnosis not present

## 2022-06-04 DIAGNOSIS — F4310 Post-traumatic stress disorder, unspecified: Secondary | ICD-10-CM | POA: Diagnosis not present

## 2022-06-04 DIAGNOSIS — F411 Generalized anxiety disorder: Secondary | ICD-10-CM | POA: Diagnosis not present

## 2022-06-04 NOTE — Progress Notes (Signed)
[facility] Counselor/Therapist Progress Note  Patient ID: Katelyn [name], MRN: [id number],    Date: 06/04/2022  Time Spent: 09:05 am to 09:45 am; total time: 40 minutes   Treatment Type: Individual Therapy  This session was held via in person. The patient consented to in-person therapy and was in the clinician's office. Limits of confidentiality were discussed with the patient.    Reported Symptoms: Pain and anxiety  Mental Status Exam: Appearance:  Well Groomed     Behavior: Appropriate  Motor: Normal  Speech/Language:  Clear and Coherent  Affect: Appropriate  Mood: normal  Thought process: normal  Thought content:   WNL  Sensory/Perceptual disturbances:   WNL  Orientation: oriented to person, place, and time/date  Attention: Good  Concentration: Good  Memory: WNL  Fund of knowledge:  Good  Insight:   Good  Judgment:  Good  Impulse Control: Good   Risk Assessment: Danger to Self:  No Self-injurious Behavior: No Danger to Others: No Duty to Warn:no Physical Aggression / Violence:No  Access to Firearms a concern: No  Gang Involvement:No   Subjective: Beginning the session, patient described herself as doing okay. After reviewing the treatment plan, patient reflected on the previous conversation with the clinician from the intake regarding the use of ACT, ketamine, EMDR, and seeing two therapists simultaneously. From there, she asked for coping skills. After practicing a skill, she stated she felt better. She was agreeable to homework and following up. She denied suicidal and homicidal ideation.    Interventions:  Worked on developing a therapeutic relationship with the patient using active listening and reflective statements. Provided emotional support using empathy and validation. Reviewed the treatment plan with the patient. Reviewed events since the last session. Validated thoughts and praised patient for talking with other therapist. Identified goals  for the session. Provided additional information regarding ACT and how it works with individuals who experience pain. Provided examples to the patient. Normalized and validated emotions expressed. Used socratic questions to assist the patient. Assisted in problem solving. Provided psychoeducation about guided imagery. Practiced and processed guided imagery. Praised patient for less distress. Assisted in problem solving. Assigned homework. Assessed for suicidal and homicidal ideation.   Homework: Implement guided imagery  Next Session: Review homework, mindfulness, PMR, and defusion  Diagnosis: F43.10 posttraumatic stress disorder, F33.1 major depressive affective disorder, recurrent, moderate and F41.1 generalized anxiety disorder  Plan:   Goals Work through the grieving process and face reality of own death Accept emotional support from others around them Live life to the fullest, event though time may be limited Become as knowledgeable about the medical condition  Reduce fear, anxiety about the health condition  Accept the illness Accept the role of psychological and behavioral factors  Stabilize anxiety level wile increasing ability to function Learn and implement coping skills that result in a reduction of anxiety  Alleviate depressive symptoms Recognize, accept, and cope with depressive feelings Develop healthy thinking patterns Develop healthy interpersonal relationships  Objectives target date for all objectives is 05/22/2023 Identify feelings associated with the illness Family members share with each other feelings Identify the losses or limitations that have been experienced Verbalize acceptance of the reality of the medical condition Commit to learning and implement a proactive approach to managing personal stresses Verbalize an understanding of the medical condition Work with therapist to develop a plan for coping with stress Learn and implement skills for managing  stress Engage in social, productive activities that are possible Engage in faith based  activities implement positive imagery Identify coping skills and sources of emotional support Patient's partner and family members verbalize their fears regarding severity of health condition Identify sources of emotional distress  Learning and implement calming skills to reduce overall anxiety Learn and implement problem solving strategies Identify and engage in pleasant activities Learning and implement personal and interpersonal skills to reduce anxiety and improve interpersonal relationships Learn to accept limitations in life and commit to tolerating, rather than avoiding, unpleasant emotions while accomplishing meaningful goals Identify major life conflicts from the past and present that form the basis for present anxiety Learn and implement behavioral strategies Verbalize an understanding and resolution of current interpersonal problems Learn and implement problem solving and decision making skills Learn and implement conflict resolution skills to resolve interpersonal problems Verbalize an understanding of healthy and unhealthy emotions verbalize insight into how past relationships may be influence current experiences with depression Use mindfulness and acceptance strategies and increase value based behavior  Increase hopeful statements about the future.   Interventions Teach about stress and ways to handle stress Assist the patient in developing a coping action plan for stressors Conduct skills based training for coping strategies Train problem focused skills Sort out what activities the individual can do Encourage patient to rely upon his/her spiritual faith Teach the patient to use guided imagery Probe and evaluate family's ability to provide emotional support Allow family to share their fears Assist the patient in identifying, sorting through, and verbalizing the various feelings  generated by his/her medical condition Meet with family members  Ask patient list out limitations  Use stress inoculation training  Use Acceptance and Commitment Therapy to help client accept uncomfortable realities in order to accomplish value-consistent goals Reinforce the client's insight into the role of his/her past emotional pain and present anxiety  Discuss examples demonstrating that unrealistic worry overestimates the probability of threats and underestimate patient's ability  Assist the patient in analyzing his or her worries Help patient understand that avoidance is reinforcing  Behavioral activation help the client explore the relationship, nature of the dispute,  Help the client develop new interpersonal skills and relationships Conduct Problem so living therapy Teach conflict resolution skills Use a process-experiential approach Conduct TLDP Conduct ACT  Goals Reduce overall frequency, intensity, and duration of trauma related symptoms Stabilize  trauma while increasing ability to function Enhance ability to effectively cope with triggers, reminders, flashbacks, moods, and cognitions of trauma Learn and implement coping skills that result in a reduction of trauma related symptomsl  Objectives target date for all objectives is 05/22/2023 Verbalize an understanding of the cognitive, physiological, and behavioral components of post traumatic stress disorder Describe in detail reminders of the triggers related to trauma  Learn and implement strategies to manage thoughts, behaviors, and feelings of trauma Learning and implement calming skills to reduce overall trauma Verbalize an understanding of the role that cognitive biases play in excessive irrational worry and persistent trauma symptoms Learn and implement personal skills to manage challenging situations Learn and implement problem solving strategies Identify and engage in pleasant activities Learn to accept limitations  in life and commit to tolerating, rather than avoiding, unpleasant emotions while accomplishing meaningful goals Maintain involvement in work, family, and social activities Reestablish a consistent sleep-wake cycle Cooperate with a medical evaluation  Interventions Engage the patient in behavioral activation Use instruction, modeling, and role-playing to build the client's general social, communication, and/or conflict resolution skills Use Acceptance and Commitment Therapy to help client accept uncomfortable realities in order  to accomplish value-consistent goals Support the client in following through with work, family, and social activities Teach and implement sleep hygiene practices  Refer the patient to a physician for a psychotropic medication consultation Monito the clint's psychotropic medication compliance Discuss how trauma typically involves excessive worry, various bodily expressions of tension, and avoidance of what is threatening that interact to maintain the problem  Teach the patient relaxation skills Assign the patient homework Discuss examples demonstrating that unrealistic worry overestimates the probability of threats and underestimates patient's ability  Assist the patient in analyzing his or her worries Help patient understand that avoidance is reinforcing   The patient and clinician reviewed the treatment plan on 06/04/2022. The patient approved of the treatment plan.   Conception Chancy, PsyD

## 2022-06-04 NOTE — Progress Notes (Signed)
                Beauty Pless, PsyD 

## 2022-06-05 ENCOUNTER — Ambulatory Visit (HOSPITAL_BASED_OUTPATIENT_CLINIC_OR_DEPARTMENT_OTHER): Payer: PPO | Admitting: Physical Therapy

## 2022-06-05 DIAGNOSIS — F331 Major depressive disorder, recurrent, moderate: Secondary | ICD-10-CM | POA: Diagnosis not present

## 2022-06-05 DIAGNOSIS — F5001 Anorexia nervosa, restricting type: Secondary | ICD-10-CM | POA: Diagnosis not present

## 2022-06-05 DIAGNOSIS — F411 Generalized anxiety disorder: Secondary | ICD-10-CM | POA: Diagnosis not present

## 2022-06-05 NOTE — Therapy (Signed)
OUTPATIENT PHYSICAL THERAPY LOWER EXTREMITY TREATMENT   Patient Name: Katelyn [name] MRN: [id number] DOB:12/01/1975, 47 y.o., female Today's Date: 06/06/2022   PT End of Session - 06/06/22 0932     Visit Number 26    Date for PT Re-Evaluation 06/27/22    Authorization Type HTA    PT Start Time 0931    PT Stop Time 1010    PT Time Calculation (min) 39 min    Activity Tolerance Patient tolerated treatment well    Behavior During Therapy [facility] for tasks assessed/performed                       Past Medical History:  Diagnosis Date   Anemia    Anorexia    Anxiety    Asthma    controlled with meds   Benign juvenile melanoma    Chronic headaches    Colon polyps    found on colonoscopy [id number]   Complication of anesthesia    itching after epidural for c section   Constipation    Depression    Depression    several suicide attempts, hospitaluzed in 2012 for this; hx pf ECT treatments ; pt sees Dr. Adele Schilder pyschiatrist  and doing well on medication   Dysrhythmia    hx of PVC's (pt hasn't seen cardiologist since 2021- no follow up needed)   GERD (gastroesophageal reflux disease)    Headache    Heart murmur    as a child - no one mentions hearing murmur anymore, sometimes it is heard. Echo has been performed 02/03/18   Heartburn    no meds   History of pneumonia    x 3  years ago - no recent problems   Hx of blood clots    Left renal atrophy 12/19/2021   Neuromuscular disorder (HCC)    small fiber neuropathy   Obesity    Pneumonia    Polycystic ovary    takes metformin to treat   Renal atrophy, left 2023   Skin lesion    Excisional biopsy of moles - none cancerous   Sleep apnea    yrs ago - diagnosed mild sleep apnea - did not have to use cpap    Past Surgical History:  Procedure Laterality Date   BREAST CAPSULECTOMY Bilateral 12/13/2020   1 week later pt had hematoma surgery on left breast   BREAST ENHANCEMENT SURGERY Bilateral 02/11/2017    BREATH TEK H PYLORI N/A 07/22/2013   Procedure: BREATH TEK H PYLORI;  Surgeon: Edward Jolly, MD;  Location: [facility];  Service: General;  Laterality: N/A;   CESAREAN SECTION  2004, 2007   x 2   COLONOSCOPY  2013   CYSTOSCOPY WITH URETHRAL DILATATION  age 8    GASTRIC ROUX-EN-Y N/A 10/31/2013   Procedure: LAPAROSCOPIC ROUX-EN-Y GASTRIC BYPASS WITH UPPER ENDOSCOPY;  Surgeon: Edward Jolly, MD;  Location: [facility];  Service: General;  Laterality: N/A;   GLUTEUS MINIMUS REPAIR Left 05/28/2021   Procedure: Left [place];  Surgeon: Vanetta Mulders, MD;  Location: [facility];  Service: Orthopedics;  Laterality: Left;   ingrown toenail Right    Ingrown nail on great right toe   kidney stent  08/28/2014   mole excision     "benign juvenile melanoma" removed from left leg - inner thigh   POLYPECTOMY     2013   SKIN LESION EXCISION     back   TONSILLECTOMY  age 7  TUBAL LIGATION  2018   Patient Active Problem List   Diagnosis Date Noted   Left renal atrophy 12/19/2021   Stage 2 chronic kidney disease 11/20/2021   Meningitis 09/27/2021   Epigastric pain 09/27/2021   GERD (gastroesophageal reflux disease) 09/27/2021   Rash and nonspecific skin eruption 09/27/2021   Hypocalcemia 09/27/2021   Chronic pain syndrome 09/27/2021   Sepsis ([place])    Fever 09/25/2021   Tear of left gluteus medius tendon    Asthma 07/18/2020   Vitamin B 12 deficiency 10/16/2019   Elevated LFTs 10/16/2019   Asthma, exogenous, unspecified asthma severity, uncomplicated [id number]   B12 deficiency anemia 06/21/2018   Venous insufficiency 04/23/2018   Onychomycosis of toenail 04/17/2018   Chronic myofascial pain 03/18/2018   Small fiber neuropathy 03/18/2018   Idiopathic small fiber sensory neuropathy 02/23/2018   Varicose veins of both lower extremities 01/13/2018   Cervical spondylosis without myelopathy 08/28/2017   Migraine with aura and without status migrainosus, not intractable  07/22/2017   Lumbar degenerative disc disease 07/20/2017   Spondylosis of lumbar spine 07/20/2017   Coccydynia 03/12/2017   Bariatric surgery status 05/02/2016   Major depressive disorder, recurrent episode, moderate ([place]) 02/18/2016   Tachy-brady syndrome ([place]) 12/19/2015   Iron deficiency anemia 06/10/2015   Sleep disorder 06/01/2013   Generalized anxiety disorder 05/30/2013   Chronic headaches 07/01/2012   Headache 07/01/2012   Depression, major, recurrent, severe with psychosis ([place]) 06/17/2012   Severe recurrent major depressive disorder with psychotic symptoms ([place]) 06/17/2012   Family history of colonic polyps 03/03/2012   Eating disorder 12/18/2011   Nausea & vomiting 01/22/2010   Polycystic ovarian syndrome 09/01/2008   Intermittent left-sided chest pain 08/02/2008    REFERRING PROVIDER: Vanetta Mulders, MD  REFERRING DIAG:  M25.511,G89.29,M25.512 (ICD-10-CM) - Chronic pain of both shoulders  S76.012A (ICD-10-CM) - Tear of left gluteus medius tendon, initial encounter  B/L shoulder program ROM and strenghening  L hip strengthening program  THERAPY DIAG:  Abnormal posture  Muscle weakness (generalized)  Rationale for Evaluation and Treatment Rehabilitation  ONSET DATE: most recently June 2023  SUBJECTIVE:   SUBJECTIVE STATEMENT: Leg is feeling stronger, more purposeful about softening the knee.    PERTINENT HISTORY: Sensory neuropathy, chronic pain, MDD, DDD  PAIN:  Are you having pain? Yes: NPRS scale: 7-shoulder, 6-hip/10 Pain location: bil shoulder, Lt hip Pain description: see subj Aggravating factors: see subj Relieving factors: see subj  PRECAUTIONS: Other: sensory neropathy Does not tolerate seated or supine positioning well.   WEIGHT BEARING RESTRICTIONS No  FALLS:  Has patient fallen in last 6 months? No  LIVING ENVIRONMENT: Lives with: lives with their family  OCCUPATION: not working  PLOF: Independent with basic ADLs and  Independent with community mobility without device  PATIENT GOALS decr pain, incr ability and stamina, out of the bed more, up steps, dressing    OBJECTIVE:   DIAGNOSTIC FINDINGS: bil shoulder xrays unremarkable on 12/06/21  MRI 03/30/22 IMPRESSION: 1. Mild tendinosis of the supraspinatus tendon with a tiny interstitial tear. 2. Mild tendinosis of the infraspinatus tendon. 3. Mild tendinosis of the intra-articular portion of the long head of the biceps tendon adjacent to the bicipital-labral complex.  PATIENT SURVEYS:  FOTO Hip 40, Shoulder 45 FOTO 04/04/22: hip 43, shoulder 48  COGNITION:  Overall cognitive status: Within functional limits for tasks assessed     SENSATION: Lt UE neuropathy-painful to touch, light sensation in Rt UE LLE neuropathy, both feet have a little bit  POSTURE:  12/22 noted that pt stands at midline with frequent swaying equal Rt to Lt  PALPATION: 12/22: pt presents with gross hypersensitivity but still very tender around hip and pelvis.   LOWER EXTREMITY ROM:  Active ROM- in standing Right eval Left eval Rt/Lt 12/8  Hip flexion 78 74 82/70  Hip extension 14 20 40/34  Hip abduction 20 18 30/20   (Blank rows = not tested)  Shoulder ROM:  1/12- prone ER at 60 deg abd-   36 passive ER is tolerable, able to passively achieve 50 with pain; actively to 42 deg  FUNCTIONAL TESTS:  6MWT 1033 ft    RPE: 8/10; pain up to 8/10 on both sides (Lt more than Rt)  12/29 998 ft   RPE 6/10; pain up to 6/10- mostly on left side (just a little on right)  GAIT: Distance walked: Rt trendelenburg- pt verbalizes sensation of instability Assistive device utilized: none at eval  AROM Right EVAL  Left EVAL Rt/Lt 12/8 Rt/Lt 1/19  Shoulder flexion 110 112 130/100 144/115  Shoulder extension 26 22 32/30   Shoulder abduction 108 100 120/80 124/110  Shoulder adduction      Shoulder internal rotation Below Sacrum mid buttock Just past neutral not behind body To  ipsilateral sacral border/to midline of buttock Rt- ipsilat SIJ Lt-mid ipsilat buttock   Shoulder external rotation t2 c5 T2/C4 C6/T4  (Blank rows = not tested)  UEROM: Unable to hook bra behind back Difficulty shaving under arms, brushing hair, crossing arms hurts, unable to reach over either shoulder. *pt verbalized numbness in feet with end range overpressure in standing shoulder flexion   TODAY'S TREATMENT:  Treatment                            06/06/22:  MANUAL: prone rib mobs- short duration with shoulders in slight flexion; scapular mobs; GHJ PA grade 3 mobs and distraction Prone scap retraction Ktape deltoid support Pulleys- forward flexion, in standing Leaning against wall, scap retraction, biceps curls- supinated & neutral Standing diag hip abd- wall behind for stop point, heel raises with glut set in turnout   Treatment                            2/2:  Side lunges with hands on table Heel raises on wall Backward walking Standing hip hige- active & with elbows on table.  Standing mini lateral rocking with knee unlocking Pec major adapted to be in pronated low, diag stretch Prone manual rib & thoraicc mobs, scapular mobilization on Left, STM to infraspinatus, upper trap, levator   Treatment                            05/23/22:  + belly press test for subscap on Left side.  Prone rib mobs, periscap STM on right Adapted W with cane to supinated & elbows forward Lt GHJ AP mobs grade 4, instanding against wall- able to reduce sharp pinch on post shoulder Wall walk with scap retraction at 90 deg forward flexion Small range IYT (to 45 deg) with 1lb in hand     PATIENT EDUCATION:  Education details: Anatomy of condition, POC, HEP, exercise form/rationale Person educated: Patient Education method: Explanation, Demonstration, Tactile cues, Verbal cues, and Handouts Education comprehension: verbalized understanding, returned demonstration, verbal cues required, tactile  cues required, and needs further education  HOME EXERCISE PROGRAM: [id number]  ASSESSMENT:  CLINICAL IMPRESSION: No complaints of pain with mobilizations applied to glenohumeral joint, more consistent with acromioclavicular joint irritation.  Patient reported feeling the tape was helpful in stabilizing the joint and I did show her the body helix shoulder brace as a long-term option.  May consider taping both shoulders if her skin tolerates the adhesive as well.  OBJECTIVE IMPAIRMENTS decreased activity tolerance, difficulty walking, decreased ROM, decreased strength, increased muscle spasms, impaired flexibility, impaired sensation, improper body mechanics, postural dysfunction, and pain.   ACTIVITY LIMITATIONS carrying, lifting, bending, sitting, standing, squatting, sleeping, stairs, transfers, bed mobility, bathing, toileting, dressing, reach over head, hygiene/grooming, locomotion level, and caring for others  PARTICIPATION LIMITATIONS: meal prep, cleaning, laundry, driving, shopping, and community activity  PERSONAL FACTORS 3+ comorbidities: see PMH  are also affecting patient's functional outcome.     GOALS: Goals reviewed with patient? Yes  SHORT TERM GOALS: Target date: 02/21/2022  GHJ IR to midline bilaterally Baseline: Goal status: achieved when done passively, pt reported Lt AC joint pain  2.  Pt will verbalize awareness of equal WB and more frequent corrections Baseline:  Goal status: achieved  3.  Independent in HEP as it has been established in the short term Baseline:  Goal status: achieved   LONG TERM GOALS: Target date: POC date  Pt will meet FOTO goals for hip and shoulder Baseline:  Goal status: ongoing  2.  6 MWT to distance greater than 50-59 age normative values Baseline: 48' on 12/29 Goal status:  3.  Able to fix hair independently pain <=3/10 Baseline: severe pain still ongoing but I feel like I can move it better Goal status:ongoing  4.   Able to dress with minimal difficulty for shoulders and hip Baseline:  Goal status: ongoing  5.  Able to navigate stairs with reciprocal pattern Baseline:  Goal status: ongoing  6.  Able to tolerate ADLs without requiring long periods in her bed due to pain Baseline:  Goal status:ongoing   PLAN: PT FREQUENCY: 1-2x/week  PT DURATION: 10 weeks  PLANNED INTERVENTIONS: Therapeutic exercises, Therapeutic activity, Neuromuscular re-education, Balance training, Gait training, Patient/Family education, Self Care, Joint mobilization, Stair training, Aquatic Therapy, Dry Needling, Electrical stimulation, Spinal mobilization, Cryotherapy, Moist heat, Taping, Manual therapy, and Re-evaluation  PLAN FOR NEXT SESSION: continue prone manual work, mobility as tolerated.   Sole Lengacher C. Jonika Critz PT, DPT 06/06/22 10:12 AM

## 2022-06-06 ENCOUNTER — Ambulatory Visit (HOSPITAL_BASED_OUTPATIENT_CLINIC_OR_DEPARTMENT_OTHER): Payer: PPO | Admitting: Physical Therapy

## 2022-06-06 ENCOUNTER — Encounter (HOSPITAL_BASED_OUTPATIENT_CLINIC_OR_DEPARTMENT_OTHER): Payer: Self-pay | Admitting: Physical Therapy

## 2022-06-06 DIAGNOSIS — M6281 Muscle weakness (generalized): Secondary | ICD-10-CM

## 2022-06-06 DIAGNOSIS — R293 Abnormal posture: Secondary | ICD-10-CM

## 2022-06-09 ENCOUNTER — Ambulatory Visit: Payer: PPO

## 2022-06-10 ENCOUNTER — Ambulatory Visit (HOSPITAL_BASED_OUTPATIENT_CLINIC_OR_DEPARTMENT_OTHER): Payer: PPO | Admitting: Physical Therapy

## 2022-06-12 DIAGNOSIS — F5001 Anorexia nervosa, restricting type: Secondary | ICD-10-CM | POA: Diagnosis not present

## 2022-06-12 DIAGNOSIS — F411 Generalized anxiety disorder: Secondary | ICD-10-CM | POA: Diagnosis not present

## 2022-06-12 DIAGNOSIS — F331 Major depressive disorder, recurrent, moderate: Secondary | ICD-10-CM | POA: Diagnosis not present

## 2022-06-13 ENCOUNTER — Encounter (HOSPITAL_BASED_OUTPATIENT_CLINIC_OR_DEPARTMENT_OTHER): Payer: Self-pay | Admitting: Physical Therapy

## 2022-06-13 ENCOUNTER — Ambulatory Visit (HOSPITAL_BASED_OUTPATIENT_CLINIC_OR_DEPARTMENT_OTHER): Payer: PPO | Admitting: Physical Therapy

## 2022-06-13 DIAGNOSIS — M6281 Muscle weakness (generalized): Secondary | ICD-10-CM

## 2022-06-13 DIAGNOSIS — M62838 Other muscle spasm: Secondary | ICD-10-CM

## 2022-06-13 DIAGNOSIS — R293 Abnormal posture: Secondary | ICD-10-CM | POA: Diagnosis not present

## 2022-06-13 NOTE — Therapy (Signed)
OUTPATIENT PHYSICAL THERAPY LOWER EXTREMITY TREATMENT   Patient Name: Katelyn [name] MRN: [id number] DOB:09-07-1975, 47 y.o., female Today's Date: 06/13/2022   PT End of Session - 06/13/22 0929     Visit Number 27    Date for PT Re-Evaluation 06/27/22    Authorization Type HTA    PT Start Time 0929    PT Stop Time 1010    PT Time Calculation (min) 41 min    Activity Tolerance Patient tolerated treatment well    Behavior During Therapy [facility] for tasks assessed/performed                       Past Medical History:  Diagnosis Date   Anemia    Anorexia    Anxiety    Asthma    controlled with meds   Benign juvenile melanoma    Chronic headaches    Colon polyps    found on colonoscopy [id number]   Complication of anesthesia    itching after epidural for c section   Constipation    Depression    Depression    several suicide attempts, hospitaluzed in 2012 for this; hx pf ECT treatments ; pt sees Dr. Adele Schilder pyschiatrist  and doing well on medication   Dysrhythmia    hx of PVC's (pt hasn't seen cardiologist since 2021- no follow up needed)   GERD (gastroesophageal reflux disease)    Headache    Heart murmur    as a child - no one mentions hearing murmur anymore, sometimes it is heard. Echo has been performed 02/03/18   Heartburn    no meds   History of pneumonia    x 3  years ago - no recent problems   Hx of blood clots    Left renal atrophy 12/19/2021   Neuromuscular disorder (HCC)    small fiber neuropathy   Obesity    Pneumonia    Polycystic ovary    takes metformin to treat   Renal atrophy, left 2023   Skin lesion    Excisional biopsy of moles - none cancerous   Sleep apnea    yrs ago - diagnosed mild sleep apnea - did not have to use cpap    Past Surgical History:  Procedure Laterality Date   BREAST CAPSULECTOMY Bilateral 12/13/2020   1 week later pt had hematoma surgery on left breast   BREAST ENHANCEMENT SURGERY Bilateral 02/11/2017    BREATH TEK H PYLORI N/A 07/22/2013   Procedure: BREATH TEK H PYLORI;  Surgeon: Edward Jolly, MD;  Location: [facility];  Service: General;  Laterality: N/A;   CESAREAN SECTION  2004, 2007   x 2   COLONOSCOPY  2013   CYSTOSCOPY WITH URETHRAL DILATATION  age 40    GASTRIC ROUX-EN-Y N/A 10/31/2013   Procedure: LAPAROSCOPIC ROUX-EN-Y GASTRIC BYPASS WITH UPPER ENDOSCOPY;  Surgeon: Edward Jolly, MD;  Location: [facility];  Service: General;  Laterality: N/A;   GLUTEUS MINIMUS REPAIR Left 05/28/2021   Procedure: Left [place];  Surgeon: Vanetta Mulders, MD;  Location: [facility];  Service: Orthopedics;  Laterality: Left;   ingrown toenail Right    Ingrown nail on great right toe   kidney stent  08/28/2014   mole excision     "benign juvenile melanoma" removed from left leg - inner thigh   POLYPECTOMY     2013   SKIN LESION EXCISION     back   TONSILLECTOMY  age 45  TUBAL LIGATION  2018   Patient Active Problem List   Diagnosis Date Noted   Left renal atrophy 12/19/2021   Stage 2 chronic kidney disease 11/20/2021   Meningitis 09/27/2021   Epigastric pain 09/27/2021   GERD (gastroesophageal reflux disease) 09/27/2021   Rash and nonspecific skin eruption 09/27/2021   Hypocalcemia 09/27/2021   Chronic pain syndrome 09/27/2021   Sepsis ([place])    Fever 09/25/2021   Tear of left gluteus medius tendon    Asthma 07/18/2020   Vitamin B 12 deficiency 10/16/2019   Elevated LFTs 10/16/2019   Asthma, exogenous, unspecified asthma severity, uncomplicated [id number]   B12 deficiency anemia 06/21/2018   Venous insufficiency 04/23/2018   Onychomycosis of toenail 04/17/2018   Chronic myofascial pain 03/18/2018   Small fiber neuropathy 03/18/2018   Idiopathic small fiber sensory neuropathy 02/23/2018   Varicose veins of both lower extremities 01/13/2018   Cervical spondylosis without myelopathy 08/28/2017   Migraine with aura and without status migrainosus, not intractable  07/22/2017   Lumbar degenerative disc disease 07/20/2017   Spondylosis of lumbar spine 07/20/2017   Coccydynia 03/12/2017   Bariatric surgery status 05/02/2016   Major depressive disorder, recurrent episode, moderate ([place]) 02/18/2016   Tachy-brady syndrome ([place]) 12/19/2015   Iron deficiency anemia 06/10/2015   Sleep disorder 06/01/2013   Generalized anxiety disorder 05/30/2013   Chronic headaches 07/01/2012   Headache 07/01/2012   Depression, major, recurrent, severe with psychosis ([place]) 06/17/2012   Severe recurrent major depressive disorder with psychotic symptoms ([place]) 06/17/2012   Family history of colonic polyps 03/03/2012   Eating disorder 12/18/2011   Nausea & vomiting 01/22/2010   Polycystic ovarian syndrome 09/01/2008   Intermittent left-sided chest pain 08/02/2008    REFERRING PROVIDER: Vanetta Mulders, MD  REFERRING DIAG:  M25.511,G89.29,M25.512 (ICD-10-CM) - Chronic pain of both shoulders  S76.012A (ICD-10-CM) - Tear of left gluteus medius tendon, initial encounter  B/L shoulder program ROM and strenghening  L hip strengthening program  THERAPY DIAG:  Abnormal posture  Muscle weakness (generalized)  Other muscle spasm  Rationale for Evaluation and Treatment Rehabilitation  ONSET DATE: most recently June 2023  SUBJECTIVE:   SUBJECTIVE STATEMENT: Have not used the raised toilet seat for 2 days. The onlydifference is the soreness of gluts lateral to SIJs. Tape was helpful on left shoulder and would like to do both today.   PERTINENT HISTORY: Sensory neuropathy, chronic pain, MDD, DDD  PAIN:  Are you having pain? Yes: NPRS scale: 7-shoulder, 6-hip/10 Pain location: bil shoulder, Lt hip Pain description: see subj Aggravating factors: see subj Relieving factors: see subj  PRECAUTIONS: Other: sensory neropathy Does not tolerate seated or supine positioning well.   WEIGHT BEARING RESTRICTIONS No  FALLS:  Has patient fallen in last 6 months?  No  LIVING ENVIRONMENT: Lives with: lives with their family  OCCUPATION: not working  PLOF: Independent with basic ADLs and Independent with community mobility without device  PATIENT GOALS decr pain, incr ability and stamina, out of the bed more, up steps, dressing    OBJECTIVE:   DIAGNOSTIC FINDINGS: bil shoulder xrays unremarkable on 12/06/21  MRI 03/30/22 IMPRESSION: 1. Mild tendinosis of the supraspinatus tendon with a tiny interstitial tear. 2. Mild tendinosis of the infraspinatus tendon. 3. Mild tendinosis of the intra-articular portion of the long head of the biceps tendon adjacent to the bicipital-labral complex.  PATIENT SURVEYS:  FOTO Hip 40, Shoulder 45 FOTO 04/04/22: hip 43, shoulder 48  COGNITION:  Overall cognitive status: Within functional limits for tasks  assessed     SENSATION: Lt UE neuropathy-painful to touch, light sensation in Rt UE LLE neuropathy, both feet have a little bit  POSTURE: 12/22 noted that pt stands at midline with frequent swaying equal Rt to Lt  PALPATION: 12/22: pt presents with gross hypersensitivity but still very tender around hip and pelvis.   LOWER EXTREMITY ROM:  Active ROM- in standing Right eval Left eval Rt/Lt 12/8  Hip flexion 78 74 82/70  Hip extension 14 20 40/34  Hip abduction 20 18 30/20   (Blank rows = not tested)  Shoulder ROM:  1/12- prone ER at 60 deg abd-   36 passive ER is tolerable, able to passively achieve 50 with pain; actively to 42 deg  FUNCTIONAL TESTS:  6MWT 1033 ft    RPE: 8/10; pain up to 8/10 on both sides (Lt more than Rt)  12/29 998 ft   RPE 6/10; pain up to 6/10- mostly on left side (just a little on right)  GAIT: Distance walked: Rt trendelenburg- pt verbalizes sensation of instability Assistive device utilized: none at eval  AROM Right EVAL  Left EVAL Rt/Lt 12/8 Rt/Lt 1/19 Rt/Lt  Shoulder flexion 110 112 130/100 144/115   Shoulder extension 26 22 32/30    Shoulder abduction  108 100 120/80 124/110 140/95  Shoulder adduction       Shoulder internal rotation Below Sacrum mid buttock Just past neutral not behind body To ipsilateral sacral border/to midline of buttock Rt- ipsilat SIJ Lt-mid ipsilat buttock    Shoulder external rotation t2 c5 T2/C4 C6/T4   (Blank rows = not tested)  UEROM: Unable to hook bra behind back Difficulty shaving under arms, brushing hair, crossing arms hurts, unable to reach over either shoulder. *pt verbalized numbness in feet with end range overpressure in standing shoulder flexion   TODAY'S TREATMENT:  Treatment                            06/13/22:  Prone rib mobilization, STM Lt scapula Hip hinge to toilet seat using rolling walker Scapular retraction with bil UE ER holding 1lb Reach and row back 1lb each hand Scap retraction with small shoulder extension Gait with arm swing & trunk rotation Ktape bil deltoids   Treatment                            06/06/22:  MANUAL: prone rib mobs- short duration with shoulders in slight flexion; scapular mobs; GHJ PA grade 3 mobs and distraction Prone scap retraction Ktape deltoid support Pulleys- forward flexion, in standing Leaning against wall, scap retraction, biceps curls- supinated & neutral Standing diag hip abd- wall behind for stop point, heel raises with glut set in turnout   Treatment                            2/2:  Side lunges with hands on table Heel raises on wall Backward walking Standing hip hige- active & with elbows on table.  Standing mini lateral rocking with knee unlocking Pec major adapted to be in pronated low, diag stretch Prone manual rib & thoraicc mobs, scapular mobilization on Left, STM to infraspinatus, upper trap, levator    PATIENT EDUCATION:  Education details: Anatomy of condition, POC, HEP, exercise form/rationale Person educated: Patient Education method: Explanation, Demonstration, Tactile cues, Verbal cues, and Handouts Education  comprehension: verbalized  understanding, returned demonstration, verbal cues required, tactile cues required, and needs further education   HOME EXERCISE PROGRAM: [id number]  ASSESSMENT:  CLINICAL IMPRESSION: Able to move to/from toilet height with hip hinge and use of RW in front of her so she will try this at home. Improved trunk rotation in gait in order to decrease demand on shoulder joints for arm swing and decr lateral weight shift in gait.   OBJECTIVE IMPAIRMENTS decreased activity tolerance, difficulty walking, decreased ROM, decreased strength, increased muscle spasms, impaired flexibility, impaired sensation, improper body mechanics, postural dysfunction, and pain.   ACTIVITY LIMITATIONS carrying, lifting, bending, sitting, standing, squatting, sleeping, stairs, transfers, bed mobility, bathing, toileting, dressing, reach over head, hygiene/grooming, locomotion level, and caring for others  PARTICIPATION LIMITATIONS: meal prep, cleaning, laundry, driving, shopping, and community activity  PERSONAL FACTORS 3+ comorbidities: see PMH  are also affecting patient's functional outcome.     GOALS: Goals reviewed with patient? Yes  SHORT TERM GOALS: Target date: 02/21/2022  GHJ IR to midline bilaterally Baseline: Goal status: achieved when done passively, pt reported Lt AC joint pain  2.  Pt will verbalize awareness of equal WB and more frequent corrections Baseline:  Goal status: achieved  3.  Independent in HEP as it has been established in the short term Baseline:  Goal status: achieved   LONG TERM GOALS: Target date: POC date  Pt will meet FOTO goals for hip and shoulder Baseline:  Goal status: ongoing  2.  6 MWT to distance greater than 50-59 age normative values Baseline: 30' on 12/29 Goal status:  3.  Able to fix hair independently pain <=3/10 Baseline: severe pain still ongoing but I feel like I can move it better Goal status:ongoing  4.  Able to dress  with minimal difficulty for shoulders and hip Baseline:  Goal status: ongoing  5.  Able to navigate stairs with reciprocal pattern Baseline:  Goal status: ongoing  6.  Able to tolerate ADLs without requiring long periods in her bed due to pain Baseline:  Goal status:ongoing   PLAN: PT FREQUENCY: 1-2x/week  PT DURATION: 10 weeks  PLANNED INTERVENTIONS: Therapeutic exercises, Therapeutic activity, Neuromuscular re-education, Balance training, Gait training, Patient/Family education, Self Care, Joint mobilization, Stair training, Aquatic Therapy, Dry Needling, Electrical stimulation, Spinal mobilization, Cryotherapy, Moist heat, Taping, Manual therapy, and Re-evaluation  PLAN FOR NEXT SESSION: continue prone manual work, mobility as tolerated.   Itzayana Pardy C. Sundi Slevin PT, DPT 06/13/22 10:11 AM

## 2022-06-16 ENCOUNTER — Ambulatory Visit: Payer: PPO

## 2022-06-16 DIAGNOSIS — R293 Abnormal posture: Secondary | ICD-10-CM | POA: Diagnosis not present

## 2022-06-16 DIAGNOSIS — R279 Unspecified lack of coordination: Secondary | ICD-10-CM

## 2022-06-16 DIAGNOSIS — M25552 Pain in left hip: Secondary | ICD-10-CM

## 2022-06-16 DIAGNOSIS — M6281 Muscle weakness (generalized): Secondary | ICD-10-CM

## 2022-06-16 DIAGNOSIS — M62838 Other muscle spasm: Secondary | ICD-10-CM

## 2022-06-16 NOTE — Therapy (Signed)
OUTPATIENT PHYSICAL THERAPY TREATMENT NOTE   Reporting Period 03/03/22 to 03/26/22  See note below for Objective Data and Assessment of Progress/Goals.    Patient Name: Katelyn [name] MRN: [id number] DOB:12/22/1975, 47 y.o., female Today's Date: 06/16/2022  PCP: Kathyrn Drown, MD  REFERRING PROVIDER: Vira Agar, MD  END OF SESSION:   PT End of Session - 06/16/22 0800     Visit Number 28    Date for PT Re-Evaluation 06/27/22    Authorization Type HTA    PT Start Time 0800    PT Stop Time 0840    PT Time Calculation (min) 40 min    Activity Tolerance Patient tolerated treatment well    Behavior During Therapy [facility] for tasks assessed/performed              Past Medical History:  Diagnosis Date   Anemia    Anorexia    Anxiety    Asthma    controlled with meds   Benign juvenile melanoma    Chronic headaches    Colon polyps    found on colonoscopy [id number]   Complication of anesthesia    itching after epidural for c section   Constipation    Depression    Depression    several suicide attempts, hospitaluzed in 2012 for this; hx pf ECT treatments ; pt sees Dr. Adele Schilder pyschiatrist  and doing well on medication   Dysrhythmia    hx of PVC's (pt hasn't seen cardiologist since 2021- no follow up needed)   GERD (gastroesophageal reflux disease)    Headache    Heart murmur    as a child - no one mentions hearing murmur anymore, sometimes it is heard. Echo has been performed 02/03/18   Heartburn    no meds   History of pneumonia    x 3  years ago - no recent problems   Hx of blood clots    Left renal atrophy 12/19/2021   Neuromuscular disorder (HCC)    small fiber neuropathy   Obesity    Pneumonia    Polycystic ovary    takes metformin to treat   Renal atrophy, left 2023   Skin lesion    Excisional biopsy of moles - none cancerous   Sleep apnea    yrs ago - diagnosed mild sleep apnea - did not have to use cpap    Past Surgical History:  Procedure  Laterality Date   BREAST CAPSULECTOMY Bilateral 12/13/2020   1 week later pt had hematoma surgery on left breast   BREAST ENHANCEMENT SURGERY Bilateral 02/11/2017   BREATH TEK H PYLORI N/A 07/22/2013   Procedure: BREATH TEK H PYLORI;  Surgeon: Edward Jolly, MD;  Location: [facility];  Service: General;  Laterality: N/A;   CESAREAN SECTION  2004, 2007   x 2   COLONOSCOPY  2013   CYSTOSCOPY WITH URETHRAL DILATATION  age 87    GASTRIC ROUX-EN-Y N/A 10/31/2013   Procedure: LAPAROSCOPIC ROUX-EN-Y GASTRIC BYPASS WITH UPPER ENDOSCOPY;  Surgeon: Edward Jolly, MD;  Location: [facility];  Service: General;  Laterality: N/A;   GLUTEUS MINIMUS REPAIR Left 05/28/2021   Procedure: Left [place];  Surgeon: Vanetta Mulders, MD;  Location: [facility];  Service: Orthopedics;  Laterality: Left;   ingrown toenail Right    Ingrown nail on great right toe   kidney stent  08/28/2014   mole excision     "benign juvenile melanoma" removed from left leg - inner thigh  POLYPECTOMY     2013   SKIN LESION EXCISION     back   TONSILLECTOMY  age 25   TUBAL LIGATION  2018   Patient Active Problem List   Diagnosis Date Noted   Left renal atrophy 12/19/2021   Stage 2 chronic kidney disease 11/20/2021   Meningitis 09/27/2021   Epigastric pain 09/27/2021   GERD (gastroesophageal reflux disease) 09/27/2021   Rash and nonspecific skin eruption 09/27/2021   Hypocalcemia 09/27/2021   Chronic pain syndrome 09/27/2021   Sepsis ([place])    Fever 09/25/2021   Tear of left gluteus medius tendon    Asthma 07/18/2020   Vitamin B 12 deficiency 10/16/2019   Elevated LFTs 10/16/2019   Asthma, exogenous, unspecified asthma severity, uncomplicated [id number]   B12 deficiency anemia 06/21/2018   Venous insufficiency 04/23/2018   Onychomycosis of toenail 04/17/2018   Chronic myofascial pain 03/18/2018   Small fiber neuropathy 03/18/2018   Idiopathic small fiber sensory neuropathy 02/23/2018   Varicose  veins of both lower extremities 01/13/2018   Cervical spondylosis without myelopathy 08/28/2017   Migraine with aura and without status migrainosus, not intractable 07/22/2017   Lumbar degenerative disc disease 07/20/2017   Spondylosis of lumbar spine 07/20/2017   Coccydynia 03/12/2017   Bariatric surgery status 05/02/2016   Major depressive disorder, recurrent episode, moderate ([place]) 02/18/2016   Tachy-brady syndrome ([place]) 12/19/2015   Iron deficiency anemia 06/10/2015   Sleep disorder 06/01/2013   Generalized anxiety disorder 05/30/2013   Chronic headaches 07/01/2012   Headache 07/01/2012   Depression, major, recurrent, severe with psychosis ([place]) 06/17/2012   Severe recurrent major depressive disorder with psychotic symptoms ([place]) 06/17/2012   Family history of colonic polyps 03/03/2012   Eating disorder 12/18/2011   Nausea & vomiting 01/22/2010   Polycystic ovarian syndrome 09/01/2008   Intermittent left-sided chest pain 08/02/2008    REFERRING DIAG: M25.511,G89.29,M25.512 (ICD-10-CM) - Chronic pain of both shoulders S76.012A (ICD-10-CM) - Tear of left gluteus medius tendon, initial encounter  THERAPY DIAG:  Abnormal posture  Muscle weakness (generalized)  Other muscle spasm  Unspecified lack of coordination  Pain in left hip  Rationale for Evaluation and Treatment Rehabilitation  PERTINENT HISTORY: Breast augmentation surgeries with follow-up reduction with complication, c/s, gastric bypass, bil tubal ligation, long history of chronic low back/pelvic pain, pudendal neuralgia, coccydynia, Lt glute med repair  PRECAUTIONS: NA  SUBJECTIVE:                                                                                                                                                                                      SUBJECTIVE STATEMENT:  Pt states that she has had diffiuclty walking due  to severe yeast infection. She made herself stop using toilet seat and has been  very sore due to this in Rt low back/SIJ.    PAIN:  Are you having pain? Yes, 7/10, Lt hip/low back   03/03/22 SUBJECTIVE STATEMENT: Pt had surgery for Lt glute med tear that most likely happened during fall 3 years ago. She has still had pain in Lt hip, but she is in rehab due to it. She had meningitis in June and ended up having Bil frozen shoulders. Her biggest complaint is of back pain, which impacts her bil shoulder pain because she has to lay on either side. She mostly lies on Rt side, which causes pain in Rt low back and Rt hip. She is rarely getting pressure off of buttocks. She continues to have sacral/coccydynia with sitting and avoids this position. She is not currently having vaginal/pelvic pain, but states this is coming and going - she manages by decreasing frequency of intercourse - no 1x/week. She is having issue with Lt kidney, but MD states that urinary issues are not related.  Is currently in PT for hip and shoulders - primary working on shoulders. Is only doing some basic leg strengthening and shoulder exercises at home.  Fluid intake: Yes: -     PAIN:  Are you having pain? Yes NPRS scale: 8/10Right and Left Pain location:    Pain type: sharp and pressure Pain description: constant    Aggravating factors: sitting, driving, finding comfortable position Relieving factors: lying on stomach relieves buttock pain if she can, but shoulders prevent this   PRECAUTIONS: None   WEIGHT BEARING RESTRICTIONS: No   FALLS:  Has patient fallen in last 6 months? No   LIVING ENVIRONMENT: Lives with: lives with their family Lives in: House/apartment   OCCUPATION: disabled   PLOF: Independent   PATIENT GOALS: would like to be able to sit; decrease bil hip/low back pain   PERTINENT HISTORY:  Breast augmentation surgeries with follow-up reduction with complication, c/s, gastric bypass, bil tubal ligation, long history of chronic low back/pelvic pain, pudendal neuralgia,  coccydynia, Lt glute med repair Sexual abuse: No   BOWEL MOVEMENT: Pain with bowel movement: No Type of bowel movement:Frequency 1x/day and Strain Yes Fully empty rectum: Yes: - Leakage: No Pads: No Fiber supplement: No no medication to help stimulate bowel movements   URINATION: Pain with urination: No Fully empty bladder: No Stream:  trouble starting the stream Urgency: no Frequency: every 30 minutes to an hour - worse once she does go, then she has to keep going - easier to just hold it Leakage:  none Pads: No   INTERCOURSE: Pain with intercourse: Initial Penetration and During Penetration Ability to have vaginal penetration:  Yes: pain - only tolerates 1x/week Climax: yes Marinoff Scale: 2/3   PREGNANCY: Vaginal deliveries 0 Tearing No C-section deliveries 2 Currently pregnant No       OBJECTIVE:  03/03/22: DIAGNOSTIC FINDINGS:  Lasix renogram with no abnormal findings   PATIENT SURVEYS:    PFIQ-7 100%   COGNITION: Overall cognitive status: Within functional limits for tasks assessed                          SENSATION: Light touch: Appears intact Proprioception: Appears intact   MUSCLE LENGTH:     FUNCTIONAL TESTS:              5xSTS: 29 seconds   GAIT:   Comments: decreased bil hip  extension, posterior pelvic tilt   POSTURE: rounded shoulders, forward head, increased lumbar lordosis, and posterior pelvic tilt   PELVIC ALIGNMENT:   LUMBARAROM/PROM:   A/PROM A/PROM  Eval (%)  Flexion 50, pain on Rt  Extension 20, no pain, very limited mobility  Right lateral flexion 50, pain in Rt low back  Left lateral flexion 75, pain in Rt low back  Right rotation 50, good stretch  Left rotation 50, good stretch   (Blank rows = not tested)     LOWER EXTREMITY MMT: tested in standing due to comfort             Rt: 3/5 hip abduction and extension, 4/5 adduction and flexion             Lt: 3/5 hip abduction, extension, flexion, 4/5 adduction      PALPATION:   General  tenderness throughout bil lumbar paraspinals and gluts, Lt>Rt; tenderness and spasm in thoracic paraspinals with decreased mobility upon deep breathing; scar tissue restriction under bil breasts that is tender; scar tissue restriction Lt glut med surgical incision                 External Perineal Exam NA                             Internal Pelvic Floor NA   Patient confirms identification and approves PT to assess internal pelvic floor and treatment Yes for future need   PELVIC MMT:   MMT eval  Vaginal    Internal Anal Sphincter    External Anal Sphincter    Puborectalis    Diastasis Recti    (Blank rows = not tested)         TONE: NA   PROLAPSE: NA   TREATMENT 06/16/22 Manual: Soft tissue mobilization to posterolateral hip muscles Scar tissue mobilization to Lt hip incision and surrounding area Sift tissue mobilization to bil lumbar paraspinals, Rt>Lt Addaday to Lt TFL/glute med Neuromuscular re-education: Met for Rt posterior/Lt anterior innominate rotation Core training with mini squat to help with toileting and having less discomfort    TREATMENT 06/03/22 Manual: Negative pressure soft tissue mobilization to Lt hip, glutes, sacral/coccygeal border Soft tissue mobilization to posterolateral hip muscles Scar tissue mobilization to Lt hip incision and surrounding area Soft tissue mobilization to lateral Lt knee and thigh Sift tissue mobilization to bil lumbar paraspinals, Rt>Lt Exercises: 3-way kick Marching Heel raises   TREATMENT 05/26/22 Manual: Negative pressure soft tissue mobilization to Lt hip, glutes, sacral/coccygeal border Soft tissue mobilization to posterolateral hip muscles Scar tissue mobilization to Lt hip incision and surrounding area Soft tissue mobilization to lateral Lt knee and thigh Lt adductor/adductor attachment release   PATIENT EDUCATION:  Education details: see above self-care Person educated:  Patient Education method: Explanation, Demonstration, Tactile cues, Verbal cues, and Handouts Education comprehension: verbalized understanding   HOME EXERCISE PROGRAM: Written handout   ASSESSMENT:   CLINICAL IMPRESSION: Rt posterior/Lt anterior innominate rotation possible in pelvis, but diffiuclt to tell in supine position. She was able to perform standing corrective MET for this rotation with improved comfort and ability to perform mini squat in hopes of helping toileting. Good tolerance to all manual techniques with improved pain at end of session. Addaday tried with moderate amount of pain. Believe that dry needling will be beneficial in the future if she can tolerate, but Addaday may be good substitute until then. She will continue to benefit  from skilled PT intervention in order to decrease pain, improve functional ability, increase ease of starting stream/emptying bladder, and improve QOL.    OBJECTIVE IMPAIRMENTS: decreased activity tolerance, decreased coordination, decreased endurance, decreased mobility, decreased ROM, decreased strength, hypomobility, increased fascial restrictions, increased muscle spasms, impaired flexibility, impaired tone, postural dysfunction, and pain.    ACTIVITY LIMITATIONS: bending, sitting, standing, stairs, transfers, bed mobility, and locomotion level   PARTICIPATION LIMITATIONS: cleaning, laundry, interpersonal relationship, driving, and community activity   PERSONAL FACTORS: 3+ comorbidities: Breast augmentation surgeries with follow-up reduction with complication, c/s, gastric bypass, bil tubal ligation, long history of chronic low back/pelvic pain, pudendal neuralgia, coccydynia, Lt glut med repair  are also affecting patient's functional outcome.    REHAB POTENTIAL: Good   CLINICAL DECISION MAKING: Stable/uncomplicated   EVALUATION COMPLEXITY: Low     GOALS: Goals reviewed with patient? Yes   SHORT TERM GOALS: Target date: 03/31/2022 -  updated 03/26/22 updated 05/14/22 - updated 06/16/22   Pt will be independent with HEP.    Baseline: Goal status: MET 03/26/22   2.  Pt will be independent with diaphragmatic breathing and down training activities in order to improve pelvic floor relaxation and abdominal wall mobility.   Baseline:  Goal status: MET 05/14/22   3.  Pt will report improve stream initiation and complete voiding with urination in order to improve QOL. Baseline:  Goal status: IN PROGRESS              4.  Pt will increase all impaired lumbar A/ROM by 25% without pain.   Baseline:  Goal status: IN PROGRESS   LONG TERM GOALS: Target date: 04/28/2022 - updated 03/26/22 - updated 05/14/22 - updated 06/16/22   Pt will be independent with advanced HEP.    Baseline:  Goal status:IN PROGRESS   2.  Pt will be able to use regular toilet without difficulty. Baseline: Only using toilet chair 50% of the time now 05/14/22 Goal status: IN PROGRESS   3.  Pt will be able to ascend/descend 12 steps at regular intervals throughout the day safely and with minimal difficulty.  Baseline:  Goal status: IN PROGRESS   4.  Pt will improve 5xSTS to less than 20 seconds and improved PFIQ by 25% in order to demonstrate improved QOL and functional ability.  Baseline: 28 seconds Goal status: IN PROGRESS   5.  Patient will report no greater than 5/10 pain with sitting in order to more easily attend appointments, drive, and spend time with family.  Baseline:  Goal status:IN PROGRESS   6.  Pt will demonstrate increase in all impaired hip strength by 1 muscle grades in order to demonstrate improved lumbopelvic support and increase functional ability.      Baseline:  Goal status: IN PROGRESS   PLAN:   PT FREQUENCY: 1x/week   PT DURATION: 8 weeks   PLANNED INTERVENTIONS: Therapeutic exercises, Therapeutic activity, Neuromuscular re-education, Balance training, Gait training, Patient/Family education, Self Care, Joint  mobilization, Aquatic Therapy, Dry Needling, Biofeedback, and Manual therapy   PLAN FOR NEXT SESSION: continue manual techniques as needed; progress mobility exercises.    Heather Roberts, PT, DPT02/19/249:32 AM

## 2022-06-17 ENCOUNTER — Ambulatory Visit (HOSPITAL_BASED_OUTPATIENT_CLINIC_OR_DEPARTMENT_OTHER): Payer: PPO | Admitting: Physical Therapy

## 2022-06-17 ENCOUNTER — Ambulatory Visit: Payer: PPO | Admitting: Psychologist

## 2022-06-19 NOTE — Therapy (Signed)
OUTPATIENT PHYSICAL THERAPY LOWER EXTREMITY TREATMENT   Patient Name: Katelyn [name] MRN: [id number] DOB:04-Oct-1975, 47 y.o., female Today's Date: 06/20/2022   PT End of Session - 06/20/22 0930     Visit Number 29    Date for PT Re-Evaluation 06/27/22    Authorization Type HTA    PT Start Time 0929    PT Stop Time 1009    PT Time Calculation (min) 40 min    Activity Tolerance Patient tolerated treatment well    Behavior During Therapy [facility] for tasks assessed/performed                        Past Medical History:  Diagnosis Date   Anemia    Anorexia    Anxiety    Asthma    controlled with meds   Benign juvenile melanoma    Chronic headaches    Colon polyps    found on colonoscopy [id number]   Complication of anesthesia    itching after epidural for c section   Constipation    Depression    Depression    several suicide attempts, hospitaluzed in 2012 for this; hx pf ECT treatments ; pt sees Dr. Adele Schilder pyschiatrist  and doing well on medication   Dysrhythmia    hx of PVC's (pt hasn't seen cardiologist since 2021- no follow up needed)   GERD (gastroesophageal reflux disease)    Headache    Heart murmur    as a child - no one mentions hearing murmur anymore, sometimes it is heard. Echo has been performed 02/03/18   Heartburn    no meds   History of pneumonia    x 3  years ago - no recent problems   Hx of blood clots    Left renal atrophy 12/19/2021   Neuromuscular disorder (HCC)    small fiber neuropathy   Obesity    Pneumonia    Polycystic ovary    takes metformin to treat   Renal atrophy, left 2023   Skin lesion    Excisional biopsy of moles - none cancerous   Sleep apnea    yrs ago - diagnosed mild sleep apnea - did not have to use cpap    Past Surgical History:  Procedure Laterality Date   BREAST CAPSULECTOMY Bilateral 12/13/2020   1 week later pt had hematoma surgery on left breast   BREAST ENHANCEMENT SURGERY Bilateral 02/11/2017    BREATH TEK H PYLORI N/A 07/22/2013   Procedure: BREATH TEK H PYLORI;  Surgeon: Edward Jolly, MD;  Location: [facility];  Service: General;  Laterality: N/A;   CESAREAN SECTION  2004, 2007   x 2   COLONOSCOPY  2013   CYSTOSCOPY WITH URETHRAL DILATATION  age 71    GASTRIC ROUX-EN-Y N/A 10/31/2013   Procedure: LAPAROSCOPIC ROUX-EN-Y GASTRIC BYPASS WITH UPPER ENDOSCOPY;  Surgeon: Edward Jolly, MD;  Location: [facility];  Service: General;  Laterality: N/A;   GLUTEUS MINIMUS REPAIR Left 05/28/2021   Procedure: Left [place];  Surgeon: Vanetta Mulders, MD;  Location: [place];  Service: Orthopedics;  Laterality: Left;   ingrown toenail Right    Ingrown nail on great right toe   kidney stent  08/28/2014   mole excision     "benign juvenile melanoma" removed from left leg - inner thigh   POLYPECTOMY     2013   SKIN LESION EXCISION     back   TONSILLECTOMY  age 64  TUBAL LIGATION  2018   Patient Active Problem List   Diagnosis Date Noted   Left renal atrophy 12/19/2021   Stage 2 chronic kidney disease 11/20/2021   Meningitis 09/27/2021   Epigastric pain 09/27/2021   GERD (gastroesophageal reflux disease) 09/27/2021   Rash and nonspecific skin eruption 09/27/2021   Hypocalcemia 09/27/2021   Chronic pain syndrome 09/27/2021   Sepsis ([place])    Fever 09/25/2021   Tear of left gluteus medius tendon    Asthma 07/18/2020   Vitamin B 12 deficiency 10/16/2019   Elevated LFTs 10/16/2019   Asthma, exogenous, unspecified asthma severity, uncomplicated [id number]   B12 deficiency anemia 06/21/2018   Venous insufficiency 04/23/2018   Onychomycosis of toenail 04/17/2018   Chronic myofascial pain 03/18/2018   Small fiber neuropathy 03/18/2018   Idiopathic small fiber sensory neuropathy 02/23/2018   Varicose veins of both lower extremities 01/13/2018   Cervical spondylosis without myelopathy 08/28/2017   Migraine with aura and without status migrainosus, not intractable  07/22/2017   Lumbar degenerative disc disease 07/20/2017   Spondylosis of lumbar spine 07/20/2017   Coccydynia 03/12/2017   Bariatric surgery status 05/02/2016   Major depressive disorder, recurrent episode, moderate ([place]) 02/18/2016   Tachy-brady syndrome ([place]) 12/19/2015   Iron deficiency anemia 06/10/2015   Sleep disorder 06/01/2013   Generalized anxiety disorder 05/30/2013   Chronic headaches 07/01/2012   Headache 07/01/2012   Depression, major, recurrent, severe with psychosis ([place]) 06/17/2012   Severe recurrent major depressive disorder with psychotic symptoms ([place]) 06/17/2012   Family history of colonic polyps 03/03/2012   Eating disorder 12/18/2011   Nausea & vomiting 01/22/2010   Polycystic ovarian syndrome 09/01/2008   Intermittent left-sided chest pain 08/02/2008    REFERRING PROVIDER: Vanetta Mulders, MD  REFERRING DIAG:  M25.511,G89.29,M25.512 (ICD-10-CM) - Chronic pain of both shoulders  S76.012A (ICD-10-CM) - Tear of left gluteus medius tendon, initial encounter  B/L shoulder program ROM and strenghening  L hip strengthening program  THERAPY DIAG:  Abnormal posture  Muscle weakness (generalized)  Other muscle spasm  Rationale for Evaluation and Treatment Rehabilitation  ONSET DATE: most recently June 2023  SUBJECTIVE:   SUBJECTIVE STATEMENT: Rt post innom rotation noted by pelvic floor therapist- pt reproted treatement of this did help with concordant pain.   PERTINENT HISTORY: Sensory neuropathy, chronic pain, MDD, DDD  PAIN:  Are you having pain? Yes: NPRS scale: 7-shoulder, 6-hip/10 Pain location: bil shoulder, Lt hip Pain description: see subj Aggravating factors: see subj Relieving factors: see subj  PRECAUTIONS: Other: sensory neropathy Does not tolerate seated or supine positioning well.   WEIGHT BEARING RESTRICTIONS No  FALLS:  Has patient fallen in last 6 months? No  LIVING ENVIRONMENT: Lives with: lives with their  family  OCCUPATION: not working  PLOF: Independent with basic ADLs and Independent with community mobility without device  PATIENT GOALS decr pain, incr ability and stamina, out of the bed more, up steps, dressing    OBJECTIVE:   DIAGNOSTIC FINDINGS: bil shoulder xrays unremarkable on 12/06/21  MRI 03/30/22 IMPRESSION: 1. Mild tendinosis of the supraspinatus tendon with a tiny interstitial tear. 2. Mild tendinosis of the infraspinatus tendon. 3. Mild tendinosis of the intra-articular portion of the long head of the biceps tendon adjacent to the bicipital-labral complex.  PATIENT SURVEYS:  FOTO Hip 40, Shoulder 45 FOTO 04/04/22: hip 43, shoulder 48  COGNITION:  Overall cognitive status: Within functional limits for tasks assessed     SENSATION: Lt UE neuropathy-painful to touch, light sensation in  Rt UE LLE neuropathy, both feet have a little bit  POSTURE: 12/22 noted that pt stands at midline with frequent swaying equal Rt to Lt  PALPATION: 12/22: pt presents with gross hypersensitivity but still very tender around hip and pelvis.   LOWER EXTREMITY ROM:  Active ROM- in standing Right eval Left eval Rt/Lt 12/8  Hip flexion 78 74 82/70  Hip extension 14 20 40/34  Hip abduction 20 18 30/20   (Blank rows = not tested)    FUNCTIONAL TESTS:  6MWT 1033 ft    RPE: 8/10; pain up to 8/10 on both sides (Lt more than Rt)  12/29 998 ft   RPE 6/10; pain up to 6/10- mostly on left side (just a little on right)  GAIT: Distance walked: Rt trendelenburg- pt verbalizes sensation of instability  Shoulder ROM:  1/12- prone ER at 60 deg abd-   36 passive ER is tolerable, able to passively achieve 50 with pain; actively to 42 degAssistive device utilized: none at eval  AROM Right EVAL  Left EVAL Rt/Lt 12/8 Rt/Lt 1/19 Rt/Lt Rt/Lt 2/23  Shoulder flexion 110 112 130/100 144/115  150/124  Shoulder extension 26 22 32/30     Shoulder abduction 108 100 120/80 124/110 140/95  114/92  Shoulder adduction        Shoulder internal rotation Below Sacrum mid buttock Just past neutral not behind body To ipsilateral sacral border/to midline of buttock Rt- ipsilat SIJ Lt-mid ipsilat buttock     Shoulder external rotation t2 c5 T2/C4 C6/T4    (Blank rows = not tested)  UEROM: Unable to hook bra behind back Difficulty shaving under arms, brushing hair, crossing arms hurts, unable to reach over either shoulder. *pt verbalized numbness in feet with end range overpressure in standing shoulder flexion   TODAY'S TREATMENT:  Treatment                            06/20/22:  Manual: prone rib mobs, STM to upper traps, IASTM across bil mid traps with arms resting on lowered arm rests to abduct scapula Standing lateral sway with scaption stretch over blue swiss ball- overpressure applied to assist in upper trap inhibition & first rib depression on left side Standing lat press down onto blue swiss ball Standing pec stretch- pronated position reduces impingement   Treatment                            06/13/22:  Prone rib mobilization, STM Lt scapula Hip hinge to toilet seat using rolling walker Scapular retraction with bil UE ER holding 1lb Reach and row back 1lb each hand Scap retraction with small shoulder extension Gait with arm swing & trunk rotation Ktape bil deltoids   Treatment                            06/06/22:  MANUAL: prone rib mobs- short duration with shoulders in slight flexion; scapular mobs; GHJ PA grade 3 mobs and distraction Prone scap retraction Ktape deltoid support Pulleys- forward flexion, in standing Leaning against wall, scap retraction, biceps curls- supinated & neutral Standing diag hip abd- wall behind for stop point, heel raises with glut set in turnout    PATIENT EDUCATION:  Education details: Anatomy of condition, POC, HEP, exercise form/rationale Person educated: Patient Education method: Explanation, Demonstration, Tactile cues, Verbal  cues, and Handouts  Education comprehension: verbalized understanding, returned demonstration, verbal cues required, tactile cues required, and needs further education   HOME EXERCISE PROGRAM: [id number]  ASSESSMENT:  CLINICAL IMPRESSION: ROM continues to slowly improve. was able to lay with arm overhead rested on lowered arm rest in prone today. Also notes that she is now able to use both hands to wash her hair with available motion but still limited by pain. Discussed injections with MD that I believe would present a great benefit to continued progression- will discuss with MD.   OBJECTIVE IMPAIRMENTS decreased activity tolerance, difficulty walking, decreased ROM, decreased strength, increased muscle spasms, impaired flexibility, impaired sensation, improper body mechanics, postural dysfunction, and pain.   ACTIVITY LIMITATIONS carrying, lifting, bending, sitting, standing, squatting, sleeping, stairs, transfers, bed mobility, bathing, toileting, dressing, reach over head, hygiene/grooming, locomotion level, and caring for others  PARTICIPATION LIMITATIONS: meal prep, cleaning, laundry, driving, shopping, and community activity  PERSONAL FACTORS 3+ comorbidities: see PMH  are also affecting patient's functional outcome.     GOALS: Goals reviewed with patient? Yes  SHORT TERM GOALS: Target date: 02/21/2022  GHJ IR to midline bilaterally Baseline: Goal status: achieved when done passively, pt reported Lt AC joint pain  2.  Pt will verbalize awareness of equal WB and more frequent corrections Baseline:  Goal status: achieved  3.  Independent in HEP as it has been established in the short term Baseline:  Goal status: achieved   LONG TERM GOALS: Target date: POC date  Pt will meet FOTO goals for hip and shoulder Baseline:  Goal status: ongoing  2.  6 MWT to distance greater than 50-59 age normative values Baseline: 41' on 12/29 Goal status:  3.  Able to fix hair  independently pain <=3/10 Baseline: severe pain still ongoing but I feel like I can move it better Goal status:ongoing  4.  Able to dress with minimal difficulty for shoulders and hip Baseline:  Goal status: ongoing  5.  Able to navigate stairs with reciprocal pattern Baseline:  Goal status: ongoing  6.  Able to tolerate ADLs without requiring long periods in her bed due to pain Baseline:  Goal status:ongoing   PLAN: PT FREQUENCY: 1-2x/week  PT DURATION: 10 weeks  PLANNED INTERVENTIONS: Therapeutic exercises, Therapeutic activity, Neuromuscular re-education, Balance training, Gait training, Patient/Family education, Self Care, Joint mobilization, Stair training, Aquatic Therapy, Dry Needling, Electrical stimulation, Spinal mobilization, Cryotherapy, Moist heat, Taping, Manual therapy, and Re-evaluation  PLAN FOR NEXT SESSION: continue prone manual work, mobility as tolerated.   Koltan Portocarrero C. Rick Carruthers PT, DPT 06/20/22 10:11 AM

## 2022-06-20 ENCOUNTER — Ambulatory Visit (HOSPITAL_BASED_OUTPATIENT_CLINIC_OR_DEPARTMENT_OTHER): Payer: PPO | Admitting: Physical Therapy

## 2022-06-20 ENCOUNTER — Encounter (HOSPITAL_BASED_OUTPATIENT_CLINIC_OR_DEPARTMENT_OTHER): Payer: Self-pay | Admitting: Physical Therapy

## 2022-06-20 DIAGNOSIS — R293 Abnormal posture: Secondary | ICD-10-CM | POA: Diagnosis not present

## 2022-06-20 DIAGNOSIS — M6281 Muscle weakness (generalized): Secondary | ICD-10-CM

## 2022-06-20 DIAGNOSIS — M62838 Other muscle spasm: Secondary | ICD-10-CM

## 2022-06-23 ENCOUNTER — Ambulatory Visit: Payer: PPO

## 2022-06-23 DIAGNOSIS — M25552 Pain in left hip: Secondary | ICD-10-CM

## 2022-06-23 DIAGNOSIS — R293 Abnormal posture: Secondary | ICD-10-CM

## 2022-06-23 DIAGNOSIS — R279 Unspecified lack of coordination: Secondary | ICD-10-CM

## 2022-06-23 DIAGNOSIS — M62838 Other muscle spasm: Secondary | ICD-10-CM

## 2022-06-23 DIAGNOSIS — M6281 Muscle weakness (generalized): Secondary | ICD-10-CM

## 2022-06-23 NOTE — Therapy (Signed)
OUTPATIENT PHYSICAL THERAPY TREATMENT NOTE   Reporting Period 03/03/22 to 03/26/22  See note below for Objective Data and Assessment of Progress/Goals.    Patient Name: Katelyn [name] MRN: [id number] DOB:26-Dec-1975, 47 y.o., female Today's Date: 06/23/2022  PCP: Kathyrn Drown, MD  REFERRING PROVIDER: Vira Agar, MD  END OF SESSION:   PT End of Session - 06/23/22 0802     Visit Number 30    Date for PT Re-Evaluation 09/01/22    Authorization Type HTA    Authorization Time Period 1    Authorization - Visit Number 10    PT Start Time 0800    PT Stop Time 0840    PT Time Calculation (min) 40 min    Activity Tolerance Patient tolerated treatment well    Behavior During Therapy [facility] for tasks assessed/performed              Past Medical History:  Diagnosis Date   Anemia    Anorexia    Anxiety    Asthma    controlled with meds   Benign juvenile melanoma    Chronic headaches    Colon polyps    found on colonoscopy [id number]   Complication of anesthesia    itching after epidural for c section   Constipation    Depression    Depression    several suicide attempts, hospitaluzed in 2012 for this; hx pf ECT treatments ; pt sees Dr. Adele Schilder pyschiatrist  and doing well on medication   Dysrhythmia    hx of PVC's (pt hasn't seen cardiologist since 2021- no follow up needed)   GERD (gastroesophageal reflux disease)    Headache    Heart murmur    as a child - no one mentions hearing murmur anymore, sometimes it is heard. Echo has been performed 02/03/18   Heartburn    no meds   History of pneumonia    x 3  years ago - no recent problems   Hx of blood clots    Left renal atrophy 12/19/2021   Neuromuscular disorder (HCC)    small fiber neuropathy   Obesity    Pneumonia    Polycystic ovary    takes metformin to treat   Renal atrophy, left 2023   Skin lesion    Excisional biopsy of moles - none cancerous   Sleep apnea    yrs ago - diagnosed mild sleep  apnea - did not have to use cpap    Past Surgical History:  Procedure Laterality Date   BREAST CAPSULECTOMY Bilateral 12/13/2020   1 week later pt had hematoma surgery on left breast   BREAST ENHANCEMENT SURGERY Bilateral 02/11/2017   BREATH TEK H PYLORI N/A 07/22/2013   Procedure: BREATH TEK H PYLORI;  Surgeon: Edward Jolly, MD;  Location: [facility];  Service: General;  Laterality: N/A;   CESAREAN SECTION  2004, 2007   x 2   COLONOSCOPY  2013   CYSTOSCOPY WITH URETHRAL DILATATION  age 28    GASTRIC ROUX-EN-Y N/A 10/31/2013   Procedure: LAPAROSCOPIC ROUX-EN-Y GASTRIC BYPASS WITH UPPER ENDOSCOPY;  Surgeon: Edward Jolly, MD;  Location: [facility];  Service: General;  Laterality: N/A;   GLUTEUS MINIMUS REPAIR Left 05/28/2021   Procedure: Left [place];  Surgeon: Vanetta Mulders, MD;  Location: [facility];  Service: Orthopedics;  Laterality: Left;   ingrown toenail Right    Ingrown nail on great right toe   kidney stent  08/28/2014   mole  excision     "benign juvenile melanoma" removed from left leg - inner thigh   POLYPECTOMY     2013   SKIN LESION EXCISION     back   TONSILLECTOMY  age 42   TUBAL LIGATION  2018   Patient Active Problem List   Diagnosis Date Noted   Left renal atrophy 12/19/2021   Stage 2 chronic kidney disease 11/20/2021   Meningitis 09/27/2021   Epigastric pain 09/27/2021   GERD (gastroesophageal reflux disease) 09/27/2021   Rash and nonspecific skin eruption 09/27/2021   Hypocalcemia 09/27/2021   Chronic pain syndrome 09/27/2021   Sepsis ([place])    Fever 09/25/2021   Tear of left gluteus medius tendon    Asthma 07/18/2020   Vitamin B 12 deficiency 10/16/2019   Elevated LFTs 10/16/2019   Asthma, exogenous, unspecified asthma severity, uncomplicated [id number]   B12 deficiency anemia 06/21/2018   Venous insufficiency 04/23/2018   Onychomycosis of toenail 04/17/2018   Chronic myofascial pain 03/18/2018   Small fiber neuropathy  03/18/2018   Idiopathic small fiber sensory neuropathy 02/23/2018   Varicose veins of both lower extremities 01/13/2018   Cervical spondylosis without myelopathy 08/28/2017   Migraine with aura and without status migrainosus, not intractable 07/22/2017   Lumbar degenerative disc disease 07/20/2017   Spondylosis of lumbar spine 07/20/2017   Coccydynia 03/12/2017   Bariatric surgery status 05/02/2016   Major depressive disorder, recurrent episode, moderate ([place]) 02/18/2016   Tachy-brady syndrome ([place]) 12/19/2015   Iron deficiency anemia 06/10/2015   Sleep disorder 06/01/2013   Generalized anxiety disorder 05/30/2013   Chronic headaches 07/01/2012   Headache 07/01/2012   Depression, major, recurrent, severe with psychosis ([place]) 06/17/2012   Severe recurrent major depressive disorder with psychotic symptoms ([place]) 06/17/2012   Family history of colonic polyps 03/03/2012   Eating disorder 12/18/2011   Nausea & vomiting 01/22/2010   Polycystic ovarian syndrome 09/01/2008   Intermittent left-sided chest pain 08/02/2008    REFERRING DIAG: M25.511,G89.29,M25.512 (ICD-10-CM) - Chronic pain of both shoulders S76.012A (ICD-10-CM) - Tear of left gluteus medius tendon, initial encounter  THERAPY DIAG:  Abnormal posture  Muscle weakness (generalized)  Other muscle spasm  Unspecified lack of coordination  Pain in left hip  Rationale for Evaluation and Treatment Rehabilitation  PERTINENT HISTORY: Breast augmentation surgeries with follow-up reduction with complication, c/s, gastric bypass, bil tubal ligation, long history of chronic low back/pelvic pain, pudendal neuralgia, coccydynia, Lt glute med repair  PRECAUTIONS: NA  SUBJECTIVE:  SUBJECTIVE STATEMENT:  Pt states that low back was better du eto lifting  toilet seat back up and treatment last week. She has lowered down toilet seat just one level and cleaned house and has now had large increase in low back pain. She is unsure which has flared her pain up. Rt hip is more painful as well. Lt side is currently tolerable and only acts up when she is lying on it.    PAIN:  Are you having pain? Yes, 9/10, Bil hip/low back   03/03/22 SUBJECTIVE STATEMENT: Pt had surgery for Lt glute med tear that most likely happened during fall 3 years ago. She has still had pain in Lt hip, but she is in rehab due to it. She had meningitis in June and ended up having Bil frozen shoulders. Her biggest complaint is of back pain, which impacts her bil shoulder pain because she has to lay on either side. She mostly lies on Rt side, which causes pain in Rt low back and Rt hip. She is rarely getting pressure off of buttocks. She continues to have sacral/coccydynia with sitting and avoids this position. She is not currently having vaginal/pelvic pain, but states this is coming and going - she manages by decreasing frequency of intercourse - no 1x/week. She is having issue with Lt kidney, but MD states that urinary issues are not related.  Is currently in PT for hip and shoulders - primary working on shoulders. Is only doing some basic leg strengthening and shoulder exercises at home.  Fluid intake: Yes: -     PAIN:  Are you having pain? Yes NPRS scale: 8/10Right and Left Pain location:    Pain type: sharp and pressure Pain description: constant    Aggravating factors: sitting, driving, finding comfortable position Relieving factors: lying on stomach relieves buttock pain if she can, but shoulders prevent this   PRECAUTIONS: None   WEIGHT BEARING RESTRICTIONS: No   FALLS:  Has patient fallen in last 6 months? No   LIVING ENVIRONMENT: Lives with: lives with their family Lives in: House/apartment   OCCUPATION: disabled   PLOF: Independent   PATIENT GOALS: would  like to be able to sit; decrease bil hip/low back pain   PERTINENT HISTORY:  Breast augmentation surgeries with follow-up reduction with complication, c/s, gastric bypass, bil tubal ligation, long history of chronic low back/pelvic pain, pudendal neuralgia, coccydynia, Lt glute med repair Sexual abuse: No   BOWEL MOVEMENT: Pain with bowel movement: No Type of bowel movement:Frequency 1x/day and Strain Yes Fully empty rectum: Yes: - Leakage: No Pads: No Fiber supplement: No no medication to help stimulate bowel movements   URINATION: Pain with urination: No Fully empty bladder: No Stream:  trouble starting the stream Urgency: no Frequency: every 30 minutes to an hour - worse once she does go, then she has to keep going - easier to just hold it Leakage:  none Pads: No   INTERCOURSE: Pain with intercourse: Initial Penetration and During Penetration Ability to have vaginal penetration:  Yes: pain - only tolerates 1x/week Climax: yes Marinoff Scale: 2/3   PREGNANCY: Vaginal deliveries 0 Tearing No C-section deliveries 2 Currently pregnant No       OBJECTIVE:  06/23/22: Significant restriction in Rt hip with trigger points palpated Rt posterior/Lt anterior innominate rotation possible in pelvic  03/03/22: DIAGNOSTIC FINDINGS:  Lasix renogram with no abnormal findings   PATIENT SURVEYS:    PFIQ-7 100%   COGNITION: Overall cognitive status: Within functional limits for  tasks assessed                          SENSATION: Light touch: Appears intact Proprioception: Appears intact   MUSCLE LENGTH:     FUNCTIONAL TESTS:              5xSTS: 29 seconds   GAIT:   Comments: decreased bil hip extension, posterior pelvic tilt   POSTURE: rounded shoulders, forward head, increased lumbar lordosis, and posterior pelvic tilt   PELVIC ALIGNMENT:   LUMBARAROM/PROM:   A/PROM A/PROM  Eval (%)  Flexion 50, pain on Rt  Extension 20, no pain, very limited mobility  Right  lateral flexion 50, pain in Rt low back  Left lateral flexion 75, pain in Rt low back  Right rotation 50, good stretch  Left rotation 50, good stretch   (Blank rows = not tested)     LOWER EXTREMITY MMT: tested in standing due to comfort             Rt: 3/5 hip abduction and extension, 4/5 adduction and flexion             Lt: 3/5 hip abduction, extension, flexion, 4/5 adduction     PALPATION:   General  tenderness throughout bil lumbar paraspinals and gluts, Lt>Rt; tenderness and spasm in thoracic paraspinals with decreased mobility upon deep breathing; scar tissue restriction under bil breasts that is tender; scar tissue restriction Lt glut med surgical incision                 External Perineal Exam NA                             Internal Pelvic Floor NA   Patient confirms identification and approves PT to assess internal pelvic floor and treatment Yes for future need   PELVIC MMT:   MMT eval  Vaginal    Internal Anal Sphincter    External Anal Sphincter    Puborectalis    Diastasis Recti    (Blank rows = not tested)         TONE: NA   PROLAPSE: NA   TREATMENT 06/23/22 RE-EVALUATION Manual: Trigger Point Dry-Needling  Treatment instructions: Expect mild to moderate muscle soreness. S/S of pneumothorax if dry needled over a lung field, and to seek immediate medical attention should they occur. Patient verbalized understanding of these instructions and education.  Patient Consent Given: Yes Education handout provided: Previously provided Muscles treated: Rt glutes/piriformis Electrical stimulation performed: No Parameters: N/A Treatment response/outcome: twitch response/release Soft tissue mobilization to lumbar paraspinals, bil glutes Therapeutic activities: Core facilitation/posterior pelvic tilt with sit<>stand transitions   TREATMENT 06/16/22 Manual: Soft tissue mobilization to posterolateral hip muscles Scar tissue mobilization to Lt hip incision and  surrounding area Sift tissue mobilization to bil lumbar paraspinals, Rt>Lt Addaday to Lt TFL/glute med Neuromuscular re-education: Met for Rt posterior/Lt anterior innominate rotation Core training with mini squat to help with toileting and having less discomfort    TREATMENT 06/03/22 Manual: Negative pressure soft tissue mobilization to Lt hip, glutes, sacral/coccygeal border Soft tissue mobilization to posterolateral hip muscles Scar tissue mobilization to Lt hip incision and surrounding area Soft tissue mobilization to lateral Lt knee and thigh Sift tissue mobilization to bil lumbar paraspinals, Rt>Lt Exercises: 3-way kick Marching Heel raises  PATIENT EDUCATION:  Education details: see above self-care Person educated: Patient Education method: Explanation, Demonstration, Tactile cues,  Verbal cues, and Handouts Education comprehension: verbalized understanding   HOME EXERCISE PROGRAM: Written handout   ASSESSMENT:   CLINICAL IMPRESSION: Pt has made some good progress with being decreasing Lt hip pain and coccydynia/vulvar pain. However, as she is attempting to put less pressure on this side lying on it, she has had increased Rt hip/low back pain. She thinks that lowering toilet seat, one of her goals is not to use this anymore, has flared her pain up; believe this is more likely due to the increase in cleaning vs toilet seat. We did discuss trying to actively facilitate core contraction during sitting on toilet seat in order to help decrease any instability with lowering down. Dry needling and manual techniques performed to Rt hip and low back with good improvement in discomfort. She is overall making progress with condition and strives for improved functional ability each week. She will continue to benefit from skilled PT intervention in order to decrease pain, improve functional ability, increase ease of starting stream/emptying bladder, and improve QOL.    OBJECTIVE  IMPAIRMENTS: decreased activity tolerance, decreased coordination, decreased endurance, decreased mobility, decreased ROM, decreased strength, hypomobility, increased fascial restrictions, increased muscle spasms, impaired flexibility, impaired tone, postural dysfunction, and pain.    ACTIVITY LIMITATIONS: bending, sitting, standing, stairs, transfers, bed mobility, and locomotion level   PARTICIPATION LIMITATIONS: cleaning, laundry, interpersonal relationship, driving, and community activity   PERSONAL FACTORS: 3+ comorbidities: Breast augmentation surgeries with follow-up reduction with complication, c/s, gastric bypass, bil tubal ligation, long history of chronic low back/pelvic pain, pudendal neuralgia, coccydynia, Lt glut med repair  are also affecting patient's functional outcome.    REHAB POTENTIAL: Good   CLINICAL DECISION MAKING: Stable/uncomplicated   EVALUATION COMPLEXITY: Low     GOALS: Goals reviewed with patient? Yes   SHORT TERM GOALS: Target date: 03/31/2022 - updated 03/26/22 updated 05/14/22 - updated 06/16/22 - updated 2/26 new target 09/01/2022   Pt will be independent with HEP.    Baseline: Goal status: MET 03/26/22   2.  Pt will be independent with diaphragmatic breathing and down training activities in order to improve pelvic floor relaxation and abdominal wall mobility.   Baseline:  Goal status: MET 05/14/22   3.  Pt will report improve stream initiation and complete voiding with urination in order to improve QOL. Baseline:  Goal status: IN PROGRESS              4.  Pt will increase all impaired lumbar A/ROM by 25% without pain.   Baseline:  Goal status: IN PROGRESS   LONG TERM GOALS: Target date: 04/28/2022 - updated 03/26/22 - updated 05/14/22 - updated 06/16/22 - updated 06/23/22 new target 09/01/2022   Pt will be independent with advanced HEP.    Baseline:  Goal status:IN PROGRESS   2.  Pt will be able to use regular toilet without difficulty. Baseline:  Pt has returned to using toilet chair 100% of the time due to increase in low back pain Goal status: IN PROGRESS   3.  Pt will be able to ascend/descend 12 steps at regular intervals throughout the day safely and with minimal difficulty.  Baseline: Pt has not been working on steps due to increase in low back pain Goal status: IN PROGRESS   4.  Pt will improve 5xSTS to less than 20 seconds and improved PFIQ by 25% in order to demonstrate improved QOL and functional ability.  Baseline: 28 seconds Goal status: IN PROGRESS   5.  Patient will  report no greater than 5/10 pain with sitting in order to more easily attend appointments, drive, and spend time with family.  Baseline:  Goal status:IN PROGRESS   6.  Pt will demonstrate increase in all impaired hip strength by 1 muscle grades in order to demonstrate improved lumbopelvic support and increase functional ability.      Baseline:  Goal status: IN PROGRESS   PLAN:   PT FREQUENCY: 1x/week   PT DURATION: 8 weeks   PLANNED INTERVENTIONS: Therapeutic exercises, Therapeutic activity, Neuromuscular re-education, Balance training, Gait training, Patient/Family education, Self Care, Joint mobilization, Aquatic Therapy, Dry Needling, Biofeedback, and Manual therapy   PLAN FOR NEXT SESSION: continue manual techniques as needed; progress mobility exercises.    Heather Roberts, PT, DPT02/26/248:44 AM

## 2022-06-24 ENCOUNTER — Ambulatory Visit (HOSPITAL_BASED_OUTPATIENT_CLINIC_OR_DEPARTMENT_OTHER): Payer: PPO | Admitting: Physical Therapy

## 2022-06-27 DIAGNOSIS — F411 Generalized anxiety disorder: Secondary | ICD-10-CM | POA: Diagnosis not present

## 2022-06-27 DIAGNOSIS — F331 Major depressive disorder, recurrent, moderate: Secondary | ICD-10-CM | POA: Diagnosis not present

## 2022-06-27 DIAGNOSIS — F5001 Anorexia nervosa, restricting type: Secondary | ICD-10-CM | POA: Diagnosis not present

## 2022-07-02 ENCOUNTER — Ambulatory Visit: Payer: PPO | Attending: Urology

## 2022-07-02 DIAGNOSIS — M6281 Muscle weakness (generalized): Secondary | ICD-10-CM

## 2022-07-02 DIAGNOSIS — R279 Unspecified lack of coordination: Secondary | ICD-10-CM | POA: Diagnosis not present

## 2022-07-02 DIAGNOSIS — M25552 Pain in left hip: Secondary | ICD-10-CM

## 2022-07-02 DIAGNOSIS — M62838 Other muscle spasm: Secondary | ICD-10-CM | POA: Diagnosis not present

## 2022-07-02 DIAGNOSIS — R293 Abnormal posture: Secondary | ICD-10-CM

## 2022-07-02 NOTE — Therapy (Signed)
OUTPATIENT PHYSICAL THERAPY TREATMENT NOTE  See note below for Objective Data and Assessment of Progress/Goals.    Patient Name: Katelyn [name] MRN: [id number] DOB:[birth year], 47 y.o., female Today's Date: 07/02/2022  PCP: Kathyrn Drown, MD  REFERRING PROVIDER: Vira Agar, MD  END OF SESSION:   PT End of Session - 07/02/22 0756     Visit Number 31    Date for PT Re-Evaluation 09/01/22    Authorization Type HTA    PT Start Time 0800    PT Stop Time 0840    PT Time Calculation (min) 40 min    Activity Tolerance Patient tolerated treatment well    Behavior During Therapy [facility] for tasks assessed/performed              Past Medical History:  Diagnosis Date   Anemia    Anorexia    Anxiety    Asthma    controlled with meds   Benign juvenile melanoma    Chronic headaches    Colon polyps    found on colonoscopy [id number]   Complication of anesthesia    itching after epidural for c section   Constipation    Depression    Depression    several suicide attempts, hospitaluzed in 2012 for this; hx pf ECT treatments ; pt sees Dr. Adele Schilder pyschiatrist  and doing well on medication   Dysrhythmia    hx of PVC's (pt hasn't seen cardiologist since 2021- no follow up needed)   GERD (gastroesophageal reflux disease)    Headache    Heart murmur    as a child - no one mentions hearing murmur anymore, sometimes it is heard. Echo has been performed 02/03/18   Heartburn    no meds   History of pneumonia    x 3  years ago - no recent problems   Hx of blood clots    Left renal atrophy 12/19/2021   Neuromuscular disorder (HCC)    small fiber neuropathy   Obesity    Pneumonia    Polycystic ovary    takes metformin to treat   Renal atrophy, left 2023   Skin lesion    Excisional biopsy of moles - none cancerous   Sleep apnea    yrs ago - diagnosed mild sleep apnea - did not have to use cpap    Past Surgical History:  Procedure Laterality Date   BREAST CAPSULECTOMY  Bilateral 12/13/2020   1 week later pt had hematoma surgery on left breast   BREAST ENHANCEMENT SURGERY Bilateral 02/11/2017   BREATH TEK H PYLORI N/A 07/22/2013   Procedure: BREATH TEK H PYLORI;  Surgeon: Edward Jolly, MD;  Location: [facility];  Service: General;  Laterality: N/A;   CESAREAN SECTION  2004, 2007   x 2   COLONOSCOPY  2013   CYSTOSCOPY WITH URETHRAL DILATATION  age 74    GASTRIC ROUX-EN-Y N/A 10/31/2013   Procedure: LAPAROSCOPIC ROUX-EN-Y GASTRIC BYPASS WITH UPPER ENDOSCOPY;  Surgeon: Edward Jolly, MD;  Location: [facility];  Service: General;  Laterality: N/A;   GLUTEUS MINIMUS REPAIR Left 05/28/2021   Procedure: Left [place];  Surgeon: Vanetta Mulders, MD;  Location: [place];  Service: Orthopedics;  Laterality: Left;   ingrown toenail Right    Ingrown nail on great right toe   kidney stent  08/28/2014   mole excision     "benign juvenile melanoma" removed from left leg - inner thigh   POLYPECTOMY  2013   SKIN LESION EXCISION     back   TONSILLECTOMY  age 27   TUBAL LIGATION  2018   Patient Active Problem List   Diagnosis Date Noted   Left renal atrophy 12/19/2021   Stage 2 chronic kidney disease 11/20/2021   Meningitis 09/27/2021   Epigastric pain 09/27/2021   GERD (gastroesophageal reflux disease) 09/27/2021   Rash and nonspecific skin eruption 09/27/2021   Hypocalcemia 09/27/2021   Chronic pain syndrome 09/27/2021   Sepsis ([place])    Fever 09/25/2021   Tear of left gluteus medius tendon    Asthma 07/18/2020   Vitamin B 12 deficiency 10/16/2019   Elevated LFTs 10/16/2019   Asthma, exogenous, unspecified asthma severity, uncomplicated [id number]   B12 deficiency anemia 06/21/2018   Venous insufficiency 04/23/2018   Onychomycosis of toenail 04/17/2018   Chronic myofascial pain 03/18/2018   Small fiber neuropathy 03/18/2018   Idiopathic small fiber sensory neuropathy 02/23/2018   Varicose veins of both lower extremities 01/13/2018    Cervical spondylosis without myelopathy 08/28/2017   Migraine with aura and without status migrainosus, not intractable 07/22/2017   Lumbar degenerative disc disease 07/20/2017   Spondylosis of lumbar spine 07/20/2017   Coccydynia 03/12/2017   Bariatric surgery status 05/02/2016   Major depressive disorder, recurrent episode, moderate ([place]) 02/18/2016   Tachy-brady syndrome ([place]) 12/19/2015   Iron deficiency anemia 06/10/2015   Sleep disorder 06/01/2013   Generalized anxiety disorder 05/30/2013   Chronic headaches 07/01/2012   Headache 07/01/2012   Depression, major, recurrent, severe with psychosis ([place]) 06/17/2012   Severe recurrent major depressive disorder with psychotic symptoms ([place]) 06/17/2012   Family history of colonic polyps 03/03/2012   Eating disorder 12/18/2011   Nausea & vomiting 01/22/2010   Polycystic ovarian syndrome 09/01/2008   Intermittent left-sided chest pain 08/02/2008    REFERRING DIAG: M25.511,G89.29,M25.512 (ICD-10-CM) - Chronic pain of both shoulders S76.012A (ICD-10-CM) - Tear of left gluteus medius tendon, initial encounter  THERAPY DIAG:  Abnormal posture  Muscle weakness (generalized)  Other muscle spasm  Unspecified lack of coordination  Pain in left hip  Rationale for Evaluation and Treatment Rehabilitation  PERTINENT HISTORY: Breast augmentation surgeries with follow-up reduction with complication, c/s, gastric bypass, bil tubal ligation, long history of chronic low back/pelvic pain, pudendal neuralgia, coccydynia, Lt glute med repair  PRECAUTIONS: NA  SUBJECTIVE:                                                                                                                                                                                      SUBJECTIVE STATEMENT:  Pt states that she is having much more low back pain, likely due to  all the cleaning that she is doing to get ready for Daughter's graduation party Saturday. She said that Rt  hip responded very well to manual techniques and DN last treatment session.     PAIN:  Are you having pain? Yes, 9/10, Bil hip/low back   03/03/22 SUBJECTIVE STATEMENT: Pt had surgery for Lt glute med tear that most likely happened during fall 3 years ago. She has still had pain in Lt hip, but she is in rehab due to it. She had meningitis in June and ended up having Bil frozen shoulders. Her biggest complaint is of back pain, which impacts her bil shoulder pain because she has to lay on either side. She mostly lies on Rt side, which causes pain in Rt low back and Rt hip. She is rarely getting pressure off of buttocks. She continues to have sacral/coccydynia with sitting and avoids this position. She is not currently having vaginal/pelvic pain, but states this is coming and going - she manages by decreasing frequency of intercourse - no 1x/week. She is having issue with Lt kidney, but MD states that urinary issues are not related.  Is currently in PT for hip and shoulders - primary working on shoulders. Is only doing some basic leg strengthening and shoulder exercises at home.  Fluid intake: Yes: -     PAIN:  Are you having pain? Yes NPRS scale: 8/10Right and Left Pain location:    Pain type: sharp and pressure Pain description: constant    Aggravating factors: sitting, driving, finding comfortable position Relieving factors: lying on stomach relieves buttock pain if she can, but shoulders prevent this   PRECAUTIONS: None   WEIGHT BEARING RESTRICTIONS: No   FALLS:  Has patient fallen in last 6 months? No   LIVING ENVIRONMENT: Lives with: lives with their family Lives in: House/apartment   OCCUPATION: disabled   PLOF: Independent   PATIENT GOALS: would like to be able to sit; decrease bil hip/low back pain   PERTINENT HISTORY:  Breast augmentation surgeries with follow-up reduction with complication, c/s, gastric bypass, bil tubal ligation, long history of chronic low back/pelvic  pain, pudendal neuralgia, coccydynia, Lt glute med repair Sexual abuse: No   BOWEL MOVEMENT: Pain with bowel movement: No Type of bowel movement:Frequency 1x/day and Strain Yes Fully empty rectum: Yes: - Leakage: No Pads: No Fiber supplement: No no medication to help stimulate bowel movements   URINATION: Pain with urination: No Fully empty bladder: No Stream:  trouble starting the stream Urgency: no Frequency: every 30 minutes to an hour - worse once she does go, then she has to keep going - easier to just hold it Leakage:  none Pads: No   INTERCOURSE: Pain with intercourse: Initial Penetration and During Penetration Ability to have vaginal penetration:  Yes: pain - only tolerates 1x/week Climax: yes Marinoff Scale: 2/3   PREGNANCY: Vaginal deliveries 0 Tearing No C-section deliveries 2 Currently pregnant No       OBJECTIVE:  06/23/22: Significant restriction in Rt hip with trigger points palpated Rt posterior/Lt anterior innominate rotation possible in pelvic  03/03/22: DIAGNOSTIC FINDINGS:  Lasix renogram with no abnormal findings   PATIENT SURVEYS:    PFIQ-7 100%   COGNITION: Overall cognitive status: Within functional limits for tasks assessed                          SENSATION: Light touch: Appears intact Proprioception: Appears intact   MUSCLE LENGTH:  FUNCTIONAL TESTS:              5xSTS: 29 seconds   GAIT:   Comments: decreased bil hip extension, posterior pelvic tilt   POSTURE: rounded shoulders, forward head, increased lumbar lordosis, and posterior pelvic tilt   PELVIC ALIGNMENT:   LUMBARAROM/PROM:   A/PROM A/PROM  Eval (%)  Flexion 50, pain on Rt  Extension 20, no pain, very limited mobility  Right lateral flexion 50, pain in Rt low back  Left lateral flexion 75, pain in Rt low back  Right rotation 50, good stretch  Left rotation 50, good stretch   (Blank rows = not tested)     LOWER EXTREMITY MMT: tested in standing due  to comfort             Rt: 3/5 hip abduction and extension, 4/5 adduction and flexion             Lt: 3/5 hip abduction, extension, flexion, 4/5 adduction     PALPATION:   General  tenderness throughout bil lumbar paraspinals and gluts, Lt>Rt; tenderness and spasm in thoracic paraspinals with decreased mobility upon deep breathing; scar tissue restriction under bil breasts that is tender; scar tissue restriction Lt glut med surgical incision                 External Perineal Exam NA                             Internal Pelvic Floor NA   Patient confirms identification and approves PT to assess internal pelvic floor and treatment Yes for future need   PELVIC MMT:   MMT eval  Vaginal    Internal Anal Sphincter    External Anal Sphincter    Puborectalis    Diastasis Recti    (Blank rows = not tested)         TONE: NA   PROLAPSE: NA   TREATMENT 07/02/22 Manual: Trigger Point Dry-Needling  Treatment instructions: Expect mild to moderate muscle soreness. S/S of pneumothorax if dry needled over a lung field, and to seek immediate medical attention should they occur. Patient verbalized understanding of these instructions and education.  Patient Consent Given: Yes Education handout provided: Previously provided Muscles treated: Rt glutes/piriformis Electrical stimulation performed: No Parameters: N/A Treatment response/outcome: twitch response/release Soft tissue mobilization to lumbar paraspinals, bil glutes  06/23/22 RE-EVALUATION Manual: Trigger Point Dry-Needling  Treatment instructions: Expect mild to moderate muscle soreness. S/S of pneumothorax if dry needled over a lung field, and to seek immediate medical attention should they occur. Patient verbalized understanding of these instructions and education.  Patient Consent Given: Yes Education handout provided: Previously provided Muscles treated: Rt glutes/piriformis Electrical stimulation performed: No Parameters:  N/A Treatment response/outcome: twitch response/release Soft tissue mobilization to lumbar paraspinals, bil glutes Therapeutic activities: Core facilitation/posterior pelvic tilt with sit<>stand transitions   TREATMENT 06/16/22 Manual: Soft tissue mobilization to posterolateral hip muscles Scar tissue mobilization to Lt hip incision and surrounding area Sift tissue mobilization to bil lumbar paraspinals, Rt>Lt Addaday to Lt TFL/glute med Neuromuscular re-education: Met for Rt posterior/Lt anterior innominate rotation Core training with mini squat to help with toileting and having less discomfort   PATIENT EDUCATION:  Education details: see above self-care Person educated: Patient Education method: Explanation, Demonstration, Tactile cues, Verbal cues, and Handouts Education comprehension: verbalized understanding   HOME EXERCISE PROGRAM: Written handout   ASSESSMENT:   CLINICAL IMPRESSION: Pt tolerated manual  techniques to low back and Lt hip very well with large decrease in pain and improved movement patterns at end of session. Significant restriction surrounding Lt sacrum/coccyx that responded less well to manual than lumbar paraspinals. We discussed working on strengthening in flexed positions to help decrease exacerbations when she is cleaning once her pain is under control again. She will continue to benefit from skilled PT intervention in order to decrease pain, improve functional ability, increase ease of starting stream/emptying bladder, and improve QOL.    OBJECTIVE IMPAIRMENTS: decreased activity tolerance, decreased coordination, decreased endurance, decreased mobility, decreased ROM, decreased strength, hypomobility, increased fascial restrictions, increased muscle spasms, impaired flexibility, impaired tone, postural dysfunction, and pain.    ACTIVITY LIMITATIONS: bending, sitting, standing, stairs, transfers, bed mobility, and locomotion level   PARTICIPATION  LIMITATIONS: cleaning, laundry, interpersonal relationship, driving, and community activity   PERSONAL FACTORS: 3+ comorbidities: Breast augmentation surgeries with follow-up reduction with complication, c/s, gastric bypass, bil tubal ligation, long history of chronic low back/pelvic pain, pudendal neuralgia, coccydynia, Lt glut med repair  are also affecting patient's functional outcome.    REHAB POTENTIAL: Good   CLINICAL DECISION MAKING: Stable/uncomplicated   EVALUATION COMPLEXITY: Low     GOALS: Goals reviewed with patient? Yes   SHORT TERM GOALS: Target date: 03/31/2022 - updated 03/26/22 updated 05/14/22 - updated 06/16/22 - updated 2/26 new target 09/01/2022   Pt will be independent with HEP.    Baseline: Goal status: MET 03/26/22   2.  Pt will be independent with diaphragmatic breathing and down training activities in order to improve pelvic floor relaxation and abdominal wall mobility.   Baseline:  Goal status: MET 05/14/22   3.  Pt will report improve stream initiation and complete voiding with urination in order to improve QOL. Baseline:  Goal status: IN PROGRESS              4.  Pt will increase all impaired lumbar A/ROM by 25% without pain.   Baseline:  Goal status: IN PROGRESS   LONG TERM GOALS: Target date: 04/28/2022 - updated 03/26/22 - updated 05/14/22 - updated 06/16/22 - updated 06/23/22 new target 09/01/2022   Pt will be independent with advanced HEP.    Baseline:  Goal status:IN PROGRESS   2.  Pt will be able to use regular toilet without difficulty. Baseline: Pt has returned to using toilet chair 100% of the time due to increase in low back pain Goal status: IN PROGRESS   3.  Pt will be able to ascend/descend 12 steps at regular intervals throughout the day safely and with minimal difficulty.  Baseline: Pt has not been working on steps due to increase in low back pain Goal status: IN PROGRESS   4.  Pt will improve 5xSTS to less than 20 seconds and improved  PFIQ by 25% in order to demonstrate improved QOL and functional ability.  Baseline: 28 seconds Goal status: IN PROGRESS   5.  Patient will report no greater than 5/10 pain with sitting in order to more easily attend appointments, drive, and spend time with family.  Baseline:  Goal status:IN PROGRESS   6.  Pt will demonstrate increase in all impaired hip strength by 1 muscle grades in order to demonstrate improved lumbopelvic support and increase functional ability.      Baseline:  Goal status: IN PROGRESS   PLAN:   PT FREQUENCY: 1x/week   PT DURATION: 8 weeks   PLANNED INTERVENTIONS: Therapeutic exercises, Therapeutic activity, Neuromuscular re-education, Balance training,  Gait training, Patient/Family education, Self Care, Joint mobilization, Aquatic Therapy, Dry Needling, Biofeedback, and Manual therapy   PLAN FOR NEXT SESSION: continue manual techniques as needed; progress mobility exercises.    Heather Roberts, PT, DPT03/06/248:41 AM

## 2022-07-04 ENCOUNTER — Encounter (HOSPITAL_BASED_OUTPATIENT_CLINIC_OR_DEPARTMENT_OTHER): Payer: PPO | Admitting: Physical Therapy

## 2022-07-07 DIAGNOSIS — F331 Major depressive disorder, recurrent, moderate: Secondary | ICD-10-CM | POA: Diagnosis not present

## 2022-07-07 DIAGNOSIS — F5001 Anorexia nervosa, restricting type: Secondary | ICD-10-CM | POA: Diagnosis not present

## 2022-07-07 DIAGNOSIS — F411 Generalized anxiety disorder: Secondary | ICD-10-CM | POA: Diagnosis not present

## 2022-07-08 ENCOUNTER — Ambulatory Visit: Payer: PPO

## 2022-07-08 DIAGNOSIS — R279 Unspecified lack of coordination: Secondary | ICD-10-CM

## 2022-07-08 DIAGNOSIS — R293 Abnormal posture: Secondary | ICD-10-CM | POA: Diagnosis not present

## 2022-07-08 DIAGNOSIS — M25552 Pain in left hip: Secondary | ICD-10-CM

## 2022-07-08 DIAGNOSIS — M62838 Other muscle spasm: Secondary | ICD-10-CM

## 2022-07-08 DIAGNOSIS — M6281 Muscle weakness (generalized): Secondary | ICD-10-CM

## 2022-07-08 NOTE — Therapy (Signed)
OUTPATIENT PHYSICAL THERAPY TREATMENT NOTE  See note below for Objective Data and Assessment of Progress/Goals.    Patient Name: Katelyn [name] MRN: [id number] DOB:1975/06/01, 47 y.o., female Today's Date: 07/08/2022  PCP: Kathyrn Drown, MD  REFERRING PROVIDER: Vira Agar, MD  END OF SESSION:   PT End of Session - 07/08/22 0759     Visit Number 32    Date for PT Re-Evaluation 09/01/22    Authorization Type HTA    Authorization Time Period 3    Authorization - Visit Number 10    PT Start Time 0800    PT Stop Time 0840    PT Time Calculation (min) 40 min    Activity Tolerance Patient tolerated treatment well    Behavior During Therapy [facility] for tasks assessed/performed               Past Medical History:  Diagnosis Date   Anemia    Anorexia    Anxiety    Asthma    controlled with meds   Benign juvenile melanoma    Chronic headaches    Colon polyps    found on colonoscopy [id number]   Complication of anesthesia    itching after epidural for c section   Constipation    Depression    Depression    several suicide attempts, hospitaluzed in 2012 for this; hx pf ECT treatments ; pt sees Dr. Adele Schilder pyschiatrist  and doing well on medication   Dysrhythmia    hx of PVC's (pt hasn't seen cardiologist since 2021- no follow up needed)   GERD (gastroesophageal reflux disease)    Headache    Heart murmur    as a child - no one mentions hearing murmur anymore, sometimes it is heard. Echo has been performed 02/03/18   Heartburn    no meds   History of pneumonia    x 3  years ago - no recent problems   Hx of blood clots    Left renal atrophy 12/19/2021   Neuromuscular disorder (HCC)    small fiber neuropathy   Obesity    Pneumonia    Polycystic ovary    takes metformin to treat   Renal atrophy, left 2023   Skin lesion    Excisional biopsy of moles - none cancerous   Sleep apnea    yrs ago - diagnosed mild sleep apnea - did not have to use cpap    Past  Surgical History:  Procedure Laterality Date   BREAST CAPSULECTOMY Bilateral 12/13/2020   1 week later pt had hematoma surgery on left breast   BREAST ENHANCEMENT SURGERY Bilateral 02/11/2017   BREATH TEK H PYLORI N/A 07/22/2013   Procedure: BREATH TEK H PYLORI;  Surgeon: Edward Jolly, MD;  Location: [facility];  Service: General;  Laterality: N/A;   CESAREAN SECTION  2004, 2007   x 2   COLONOSCOPY  2013   CYSTOSCOPY WITH URETHRAL DILATATION  age 79    GASTRIC ROUX-EN-Y N/A 10/31/2013   Procedure: LAPAROSCOPIC ROUX-EN-Y GASTRIC BYPASS WITH UPPER ENDOSCOPY;  Surgeon: Edward Jolly, MD;  Location: [facility];  Service: General;  Laterality: N/A;   GLUTEUS MINIMUS REPAIR Left 05/28/2021   Procedure: Left [place];  Surgeon: Vanetta Mulders, MD;  Location: [place];  Service: Orthopedics;  Laterality: Left;   ingrown toenail Right    Ingrown nail on great right toe   kidney stent  08/28/2014   mole excision     "benign  juvenile melanoma" removed from left leg - inner thigh   POLYPECTOMY     2013   SKIN LESION EXCISION     back   TONSILLECTOMY  age 27   TUBAL LIGATION  2018   Patient Active Problem List   Diagnosis Date Noted   Left renal atrophy 12/19/2021   Stage 2 chronic kidney disease 11/20/2021   Meningitis 09/27/2021   Epigastric pain 09/27/2021   GERD (gastroesophageal reflux disease) 09/27/2021   Rash and nonspecific skin eruption 09/27/2021   Hypocalcemia 09/27/2021   Chronic pain syndrome 09/27/2021   Sepsis ([place])    Fever 09/25/2021   Tear of left gluteus medius tendon    Asthma 07/18/2020   Vitamin B 12 deficiency 10/16/2019   Elevated LFTs 10/16/2019   Asthma, exogenous, unspecified asthma severity, uncomplicated [id number]   B12 deficiency anemia 06/21/2018   Venous insufficiency 04/23/2018   Onychomycosis of toenail 04/17/2018   Chronic myofascial pain 03/18/2018   Small fiber neuropathy 03/18/2018   Idiopathic small fiber sensory  neuropathy 02/23/2018   Varicose veins of both lower extremities 01/13/2018   Cervical spondylosis without myelopathy 08/28/2017   Migraine with aura and without status migrainosus, not intractable 07/22/2017   Lumbar degenerative disc disease 07/20/2017   Spondylosis of lumbar spine 07/20/2017   Coccydynia 03/12/2017   Bariatric surgery status 05/02/2016   Major depressive disorder, recurrent episode, moderate ([place]) 02/18/2016   Tachy-brady syndrome ([place]) 12/19/2015   Iron deficiency anemia 06/10/2015   Sleep disorder 06/01/2013   Generalized anxiety disorder 05/30/2013   Chronic headaches 07/01/2012   Headache 07/01/2012   Depression, major, recurrent, severe with psychosis ([place]) 06/17/2012   Severe recurrent major depressive disorder with psychotic symptoms ([place]) 06/17/2012   Family history of colonic polyps 03/03/2012   Eating disorder 12/18/2011   Nausea & vomiting 01/22/2010   Polycystic ovarian syndrome 09/01/2008   Intermittent left-sided chest pain 08/02/2008    REFERRING DIAG: M25.511,G89.29,M25.512 (ICD-10-CM) - Chronic pain of both shoulders S76.012A (ICD-10-CM) - Tear of left gluteus medius tendon, initial encounter  THERAPY DIAG:  Abnormal posture  Muscle weakness (generalized)  Other muscle spasm  Unspecified lack of coordination  Pain in left hip  Rationale for Evaluation and Treatment Rehabilitation  PERTINENT HISTORY: Breast augmentation surgeries with follow-up reduction with complication, c/s, gastric bypass, bil tubal ligation, long history of chronic low back/pelvic pain, pudendal neuralgia, coccydynia, Lt glute med repair  PRECAUTIONS: NA  SUBJECTIVE:                                                                                                                                                                                      SUBJECTIVE  STATEMENT:  Pt states that she has not used the elevated toilet seat for two days straight now and no increase  in pain. She feels like she is getting tired and she feels like there is a little more strain in her low back - getting up is the hardest part. She feels like her body is recovering now that party is over. She did have episode of urinary incontinence with crying/laughing.    PAIN:  Are you having pain? Yes, 7/10, Bil hip/low back   03/03/22 SUBJECTIVE STATEMENT: Pt had surgery for Lt glute med tear that most likely happened during fall 3 years ago. She has still had pain in Lt hip, but she is in rehab due to it. She had meningitis in June and ended up having Bil frozen shoulders. Her biggest complaint is of back pain, which impacts her bil shoulder pain because she has to lay on either side. She mostly lies on Rt side, which causes pain in Rt low back and Rt hip. She is rarely getting pressure off of buttocks. She continues to have sacral/coccydynia with sitting and avoids this position. She is not currently having vaginal/pelvic pain, but states this is coming and going - she manages by decreasing frequency of intercourse - no 1x/week. She is having issue with Lt kidney, but MD states that urinary issues are not related.  Is currently in PT for hip and shoulders - primary working on shoulders. Is only doing some basic leg strengthening and shoulder exercises at home.  Fluid intake: Yes: -     PAIN:  Are you having pain? Yes NPRS scale: 8/10Right and Left Pain location:    Pain type: sharp and pressure Pain description: constant    Aggravating factors: sitting, driving, finding comfortable position Relieving factors: lying on stomach relieves buttock pain if she can, but shoulders prevent this   PRECAUTIONS: None   WEIGHT BEARING RESTRICTIONS: No   FALLS:  Has patient fallen in last 6 months? No   LIVING ENVIRONMENT: Lives with: lives with their family Lives in: House/apartment   OCCUPATION: disabled   PLOF: Independent   PATIENT GOALS: would like to be able to sit; decrease bil  hip/low back pain   PERTINENT HISTORY:  Breast augmentation surgeries with follow-up reduction with complication, c/s, gastric bypass, bil tubal ligation, long history of chronic low back/pelvic pain, pudendal neuralgia, coccydynia, Lt glute med repair Sexual abuse: No   BOWEL MOVEMENT: Pain with bowel movement: No Type of bowel movement:Frequency 1x/day and Strain Yes Fully empty rectum: Yes: - Leakage: No Pads: No Fiber supplement: No no medication to help stimulate bowel movements   URINATION: Pain with urination: No Fully empty bladder: No Stream:  trouble starting the stream Urgency: no Frequency: every 30 minutes to an hour - worse once she does go, then she has to keep going - easier to just hold it Leakage:  none Pads: No   INTERCOURSE: Pain with intercourse: Initial Penetration and During Penetration Ability to have vaginal penetration:  Yes: pain - only tolerates 1x/week Climax: yes Marinoff Scale: 2/3   PREGNANCY: Vaginal deliveries 0 Tearing No C-section deliveries 2 Currently pregnant No       OBJECTIVE:  06/23/22: Significant restriction in Rt hip with trigger points palpated Rt posterior/Lt anterior innominate rotation possible in pelvic  03/03/22: DIAGNOSTIC FINDINGS:  Lasix renogram with no abnormal findings   PATIENT SURVEYS:    PFIQ-7 100%   COGNITION: Overall cognitive status: Within functional limits for tasks assessed  SENSATION: Light touch: Appears intact Proprioception: Appears intact   MUSCLE LENGTH:     FUNCTIONAL TESTS:              5xSTS: 29 seconds   GAIT:   Comments: decreased bil hip extension, posterior pelvic tilt   POSTURE: rounded shoulders, forward head, increased lumbar lordosis, and posterior pelvic tilt   PELVIC ALIGNMENT:   LUMBARAROM/PROM:   A/PROM A/PROM  Eval (%)  Flexion 50, pain on Rt  Extension 20, no pain, very limited mobility  Right lateral flexion 50, pain in Rt low  back  Left lateral flexion 75, pain in Rt low back  Right rotation 50, good stretch  Left rotation 50, good stretch   (Blank rows = not tested)     LOWER EXTREMITY MMT: tested in standing due to comfort             Rt: 3/5 hip abduction and extension, 4/5 adduction and flexion             Lt: 3/5 hip abduction, extension, flexion, 4/5 adduction     PALPATION:   General  tenderness throughout bil lumbar paraspinals and gluts, Lt>Rt; tenderness and spasm in thoracic paraspinals with decreased mobility upon deep breathing; scar tissue restriction under bil breasts that is tender; scar tissue restriction Lt glut med surgical incision                 External Perineal Exam NA                             Internal Pelvic Floor NA   Patient confirms identification and approves PT to assess internal pelvic floor and treatment Yes for future need   PELVIC MMT:   MMT eval  Vaginal    Internal Anal Sphincter    External Anal Sphincter    Puborectalis    Diastasis Recti    (Blank rows = not tested)         TONE: NA   PROLAPSE: NA   TREATMENT 07/08/22 Manual: Soft tissue mobilization to low back/hips Myofascial release of low back and bil hips Therapeutic activities: Squat training in parallel bars in order to achieve better descent to toilet   TREATMENT 07/02/22 Manual: Trigger Point Dry-Needling  Treatment instructions: Expect mild to moderate muscle soreness. S/S of pneumothorax if dry needled over a lung field, and to seek immediate medical attention should they occur. Patient verbalized understanding of these instructions and education.  Patient Consent Given: Yes Education handout provided: Previously provided Muscles treated: Rt glutes/piriformis Electrical stimulation performed: No Parameters: N/A Treatment response/outcome: twitch response/release Soft tissue mobilization to lumbar paraspinals, bil glutes  06/23/22 RE-EVALUATION Manual: Trigger Point Dry-Needling   Treatment instructions: Expect mild to moderate muscle soreness. S/S of pneumothorax if dry needled over a lung field, and to seek immediate medical attention should they occur. Patient verbalized understanding of these instructions and education.  Patient Consent Given: Yes Education handout provided: Previously provided Muscles treated: Rt glutes/piriformis Electrical stimulation performed: No Parameters: N/A Treatment response/outcome: twitch response/release Soft tissue mobilization to lumbar paraspinals, bil glutes Therapeutic activities: Core facilitation/posterior pelvic tilt with sit<>stand transitions    PATIENT EDUCATION:  Education details: see above self-care Person educated: Patient Education method: Explanation, Demonstration, Tactile cues, Verbal cues, and Handouts Education comprehension: verbalized understanding   HOME EXERCISE PROGRAM: Written handout   ASSESSMENT:   CLINICAL IMPRESSION: Patient doing much better today. She is having pain  and difficulty with squat to/from toilet seat still. Because of this, we worked on squatting form in parallel bars. Believe squats with wall ball are good, but not allowing her the correct strengthening technique that she will need for lowering and rising form toilet correctly and with less discomfort. She did very well with this today and reported some pull in low back but no pain. Excellent tolerance to manual techniques with pain 4/10 at end of session - the lowest it has been since returning to PT. She will continue to benefit from skilled PT intervention in order to decrease pain, improve functional ability, increase ease of starting stream/emptying bladder, and improve QOL.    OBJECTIVE IMPAIRMENTS: decreased activity tolerance, decreased coordination, decreased endurance, decreased mobility, decreased ROM, decreased strength, hypomobility, increased fascial restrictions, increased muscle spasms, impaired flexibility, impaired  tone, postural dysfunction, and pain.    ACTIVITY LIMITATIONS: bending, sitting, standing, stairs, transfers, bed mobility, and locomotion level   PARTICIPATION LIMITATIONS: cleaning, laundry, interpersonal relationship, driving, and community activity   PERSONAL FACTORS: 3+ comorbidities: Breast augmentation surgeries with follow-up reduction with complication, c/s, gastric bypass, bil tubal ligation, long history of chronic low back/pelvic pain, pudendal neuralgia, coccydynia, Lt glut med repair  are also affecting patient's functional outcome.    REHAB POTENTIAL: Good   CLINICAL DECISION MAKING: Stable/uncomplicated   EVALUATION COMPLEXITY: Low     GOALS: Goals reviewed with patient? Yes   SHORT TERM GOALS: Target date: 03/31/2022 - updated 03/26/22 updated 05/14/22 - updated 06/16/22 - updated 2/26 new target 09/01/2022   Pt will be independent with HEP.    Baseline: Goal status: MET 03/26/22   2.  Pt will be independent with diaphragmatic breathing and down training activities in order to improve pelvic floor relaxation and abdominal wall mobility.   Baseline:  Goal status: MET 05/14/22   3.  Pt will report improve stream initiation and complete voiding with urination in order to improve QOL. Baseline:  Goal status: IN PROGRESS              4.  Pt will increase all impaired lumbar A/ROM by 25% without pain.   Baseline:  Goal status: IN PROGRESS   LONG TERM GOALS: Target date: 04/28/2022 - updated 03/26/22 - updated 05/14/22 - updated 06/16/22 - updated 06/23/22 new target 09/01/2022   Pt will be independent with advanced HEP.    Baseline:  Goal status:IN PROGRESS   2.  Pt will be able to use regular toilet without difficulty. Baseline: Pt has returned to using toilet chair 100% of the time due to increase in low back pain Goal status: IN PROGRESS   3.  Pt will be able to ascend/descend 12 steps at regular intervals throughout the day safely and with minimal difficulty.   Baseline: Pt has not been working on steps due to increase in low back pain Goal status: IN PROGRESS   4.  Pt will improve 5xSTS to less than 20 seconds and improved PFIQ by 25% in order to demonstrate improved QOL and functional ability.  Baseline: 28 seconds Goal status: IN PROGRESS   5.  Patient will report no greater than 5/10 pain with sitting in order to more easily attend appointments, drive, and spend time with family.  Baseline:  Goal status:IN PROGRESS   6.  Pt will demonstrate increase in all impaired hip strength by 1 muscle grades in order to demonstrate improved lumbopelvic support and increase functional ability.      Baseline:  Goal  status: IN PROGRESS   PLAN:   PT FREQUENCY: 1x/week   PT DURATION: 8 weeks   PLANNED INTERVENTIONS: Therapeutic exercises, Therapeutic activity, Neuromuscular re-education, Balance training, Gait training, Patient/Family education, Self Care, Joint mobilization, Aquatic Therapy, Dry Needling, Biofeedback, and Manual therapy   PLAN FOR NEXT SESSION: continue manual techniques as needed; progress mobility exercises.    Heather Roberts, PT, DPT03/12/248:47 AM

## 2022-07-10 DIAGNOSIS — F411 Generalized anxiety disorder: Secondary | ICD-10-CM | POA: Diagnosis not present

## 2022-07-10 DIAGNOSIS — F5001 Anorexia nervosa, restricting type: Secondary | ICD-10-CM | POA: Insufficient documentation

## 2022-07-10 DIAGNOSIS — F410 Panic disorder [episodic paroxysmal anxiety] without agoraphobia: Secondary | ICD-10-CM | POA: Insufficient documentation

## 2022-07-10 DIAGNOSIS — F331 Major depressive disorder, recurrent, moderate: Secondary | ICD-10-CM | POA: Insufficient documentation

## 2022-07-11 ENCOUNTER — Encounter (HOSPITAL_BASED_OUTPATIENT_CLINIC_OR_DEPARTMENT_OTHER): Payer: PPO | Admitting: Physical Therapy

## 2022-07-14 ENCOUNTER — Ambulatory Visit: Payer: PPO

## 2022-07-16 ENCOUNTER — Ambulatory Visit (HOSPITAL_BASED_OUTPATIENT_CLINIC_OR_DEPARTMENT_OTHER): Payer: PPO | Attending: Orthopaedic Surgery | Admitting: Physical Therapy

## 2022-07-16 ENCOUNTER — Encounter (HOSPITAL_BASED_OUTPATIENT_CLINIC_OR_DEPARTMENT_OTHER): Payer: Self-pay | Admitting: Physical Therapy

## 2022-07-16 DIAGNOSIS — M6281 Muscle weakness (generalized): Secondary | ICD-10-CM | POA: Insufficient documentation

## 2022-07-16 DIAGNOSIS — R293 Abnormal posture: Secondary | ICD-10-CM | POA: Insufficient documentation

## 2022-07-16 DIAGNOSIS — M62838 Other muscle spasm: Secondary | ICD-10-CM | POA: Insufficient documentation

## 2022-07-16 NOTE — Therapy (Signed)
OUTPATIENT PHYSICAL THERAPY LOWER EXTREMITY TREATMENT   Patient Name: Katelyn [name] MRN: [id number] DOB:Nov 14, 1975, 47 y.o., female Today's Date: 07/16/2022   PT End of Session - 07/16/22 1020     Visit Number 33    Date for PT Re-Evaluation 09/01/22    Authorization Type HTA    Authorization Time Period 3    PT Start Time 1020    PT Stop Time 1101    PT Time Calculation (min) 41 min    Activity Tolerance Patient tolerated treatment well    Behavior During Therapy [facility] for tasks assessed/performed                        Past Medical History:  Diagnosis Date   Anemia    Anorexia    Anxiety    Asthma    controlled with meds   Benign juvenile melanoma    Chronic headaches    Colon polyps    found on colonoscopy [id number]   Complication of anesthesia    itching after epidural for c section   Constipation    Depression    Depression    several suicide attempts, hospitaluzed in 2012 for this; hx pf ECT treatments ; pt sees Dr. Adele Schilder pyschiatrist  and doing well on medication   Dysrhythmia    hx of PVC's (pt hasn't seen cardiologist since 2021- no follow up needed)   GERD (gastroesophageal reflux disease)    Headache    Heart murmur    as a child - no one mentions hearing murmur anymore, sometimes it is heard. Echo has been performed 02/03/18   Heartburn    no meds   History of pneumonia    x 3  years ago - no recent problems   Hx of blood clots    Left renal atrophy 12/19/2021   Neuromuscular disorder (HCC)    small fiber neuropathy   Obesity    Pneumonia    Polycystic ovary    takes metformin to treat   Renal atrophy, left 2023   Skin lesion    Excisional biopsy of moles - none cancerous   Sleep apnea    yrs ago - diagnosed mild sleep apnea - did not have to use cpap    Past Surgical History:  Procedure Laterality Date   BREAST CAPSULECTOMY Bilateral 12/13/2020   1 week later pt had hematoma surgery on left breast   BREAST ENHANCEMENT  SURGERY Bilateral 02/11/2017   BREATH TEK H PYLORI N/A 07/22/2013   Procedure: BREATH TEK H PYLORI;  Surgeon: Edward Jolly, MD;  Location: [facility];  Service: General;  Laterality: N/A;   CESAREAN SECTION  2004, 2007   x 2   COLONOSCOPY  2013   CYSTOSCOPY WITH URETHRAL DILATATION  age 13    GASTRIC ROUX-EN-Y N/A 10/31/2013   Procedure: LAPAROSCOPIC ROUX-EN-Y GASTRIC BYPASS WITH UPPER ENDOSCOPY;  Surgeon: Edward Jolly, MD;  Location: [facility];  Service: General;  Laterality: N/A;   GLUTEUS MINIMUS REPAIR Left 05/28/2021   Procedure: Left Chical;  Surgeon: Vanetta Mulders, MD;  Location: [facility];  Service: Orthopedics;  Laterality: Left;   ingrown toenail Right    Ingrown nail on great right toe   kidney stent  08/28/2014   mole excision     "benign juvenile melanoma" removed from left leg - inner thigh   POLYPECTOMY     2013   SKIN LESION EXCISION     back  TONSILLECTOMY  age 39   TUBAL LIGATION  2018   Patient Active Problem List   Diagnosis Date Noted   Left renal atrophy 12/19/2021   Stage 2 chronic kidney disease 11/20/2021   Meningitis 09/27/2021   Epigastric pain 09/27/2021   GERD (gastroesophageal reflux disease) 09/27/2021   Rash and nonspecific skin eruption 09/27/2021   Hypocalcemia 09/27/2021   Chronic pain syndrome 09/27/2021   Sepsis ([place])    Fever 09/25/2021   Tear of left gluteus medius tendon    Asthma 07/18/2020   Vitamin B 12 deficiency 10/16/2019   Elevated LFTs 10/16/2019   Asthma, exogenous, unspecified asthma severity, uncomplicated 10/22/9483   B12 deficiency anemia 06/21/2018   Venous insufficiency 04/23/2018   Onychomycosis of toenail 04/17/2018   Chronic myofascial pain 03/18/2018   Small fiber neuropathy 03/18/2018   Idiopathic small fiber sensory neuropathy 02/23/2018   Varicose veins of both lower extremities 01/13/2018   Cervical spondylosis without myelopathy 08/28/2017   Migraine with aura and without status  migrainosus, not intractable 07/22/2017   Lumbar degenerative disc disease 07/20/2017   Spondylosis of lumbar spine 07/20/2017   Coccydynia 03/12/2017   Bariatric surgery status 05/02/2016   Major depressive disorder, recurrent episode, moderate ([place]) 02/18/2016   Tachy-brady syndrome ([place]) 12/19/2015   Iron deficiency anemia 06/10/2015   Sleep disorder 06/01/2013   Generalized anxiety disorder 05/30/2013   Chronic headaches 07/01/2012   Headache 07/01/2012   Depression, major, recurrent, severe with psychosis ([place]) 06/17/2012   Severe recurrent major depressive disorder with psychotic symptoms ([place]) 06/17/2012   Family history of colonic polyps 03/03/2012   Eating disorder 12/18/2011   Nausea & vomiting 01/22/2010   Polycystic ovarian syndrome 09/01/2008   Intermittent left-sided chest pain 08/02/2008    REFERRING PROVIDER: Vanetta Mulders, MD  REFERRING DIAG:  M25.511,G89.29,M25.512 (ICD-10-CM) - Chronic pain of both shoulders  S76.012A (ICD-10-CM) - Tear of left gluteus medius tendon, initial encounter  B/L shoulder program ROM and strenghening  L hip strengthening program  THERAPY DIAG:  Abnormal posture  Muscle weakness (generalized)  Other muscle spasm  Rationale for Evaluation and Treatment Rehabilitation  ONSET DATE: most recently June 2023  SUBJECTIVE:   SUBJECTIVE STATEMENT: Able to tolerate laying prone with Lt UE ER slightly for about 15 min now whereas it was not tolerable at all before. I feel it in my upper back. Reaching behind/ER still created [facility] joint-area pain. Trying to catch something I double over in pain. Not feeling well in the last few weeks. Better: washing hair, hooking bra. Can reach to top of head to wash hair with both hands. Able to pull bra straps off shoulders easier.   PERTINENT HISTORY: Sensory neuropathy, chronic pain, MDD, DDD  PAIN:  Are you having pain? Yes: NPRS scale: 7-shoulder, 6-hip/10 Pain location: bil shoulder, Lt  hip Pain description: see subj Aggravating factors: see subj Relieving factors: see subj  PRECAUTIONS: Other: sensory neropathy Does not tolerate seated or supine positioning well.   WEIGHT BEARING RESTRICTIONS No  FALLS:  Has patient fallen in last 6 months? No  LIVING ENVIRONMENT: Lives with: lives with their family  OCCUPATION: not working  PLOF: Independent with basic ADLs and Independent with community mobility without device  PATIENT GOALS decr pain, incr ability and stamina, out of the bed more, up steps, dressing    OBJECTIVE:   DIAGNOSTIC FINDINGS: bil shoulder xrays unremarkable on 12/06/21  MRI 03/30/22 IMPRESSION: 1. Mild tendinosis of the supraspinatus tendon with a tiny interstitial tear. 2.  Mild tendinosis of the infraspinatus tendon. 3. Mild tendinosis of the intra-articular portion of the long head of the biceps tendon adjacent to the bicipital-labral complex.  PATIENT SURVEYS:  FOTO Hip 40, Shoulder 45 FOTO 04/04/22: hip 43, shoulder 48  COGNITION:  Overall cognitive status: Within functional limits for tasks assessed     SENSATION: Lt UE neuropathy-painful to touch, light sensation in Rt UE LLE neuropathy, both feet have a little bit  POSTURE: 12/22 noted that pt stands at midline with frequent swaying equal Rt to Lt  PALPATION: 12/22: pt presents with gross hypersensitivity but still very tender around hip and pelvis.   LOWER EXTREMITY ROM:  Active ROM- in standing Right eval Left eval Rt/Lt 12/8  Hip flexion 78 74 82/70  Hip extension 14 20 40/34  Hip abduction 20 18 30/20   (Blank rows = not tested)    FUNCTIONAL TESTS:  6MWT 1033 ft    RPE: 8/10; pain up to 8/10 on both sides (Lt more than Rt)  12/29 998 ft   RPE 6/10; pain up to 6/10- mostly on left side (just a little on right)  GAIT: Distance walked: Rt trendelenburg- pt verbalizes sensation of instability  Shoulder ROM:  1/12- prone ER at 60 deg abd-   36 passive ER is  tolerable, able to passively achieve 50 with pain; actively to 42 degAssistive device utilized: none at eval  AROM Right EVAL  Left EVAL Rt/Lt 12/8 Rt/Lt 1/19 Rt/Lt Rt/Lt 2/23 Rt/Lt 3/20  Shoulder flexion 110 112 130/100 144/115  150/124 150/130  Shoulder extension 26 22 32/30      Shoulder abduction 108 100 120/80 124/110 140/95 114/92 130/120  Shoulder adduction         Shoulder internal rotation Below Sacrum mid buttock Just past neutral not behind body To ipsilateral sacral border/to midline of buttock Rt- ipsilat SIJ Lt-mid ipsilat buttock       Shoulder external rotation t2 c5 T2/C4 C6/T4     (Blank rows = not tested)  UEROM: Unable to hook bra behind back Difficulty shaving under arms, brushing hair, crossing arms hurts, unable to reach over either shoulder. *pt verbalized numbness in feet with end range overpressure in standing shoulder flexion   TODAY'S TREATMENT:  Treatment                            07/16/22:  Manual: Prone rib and thoracic mobs Prone scap retraction Prone retraction+shoulder ext Ktape bil deltoid support Standing pec stretch- pronated position reduces impingement Standing lat press down onto blue swiss ball- forward flexion & in scaption Rows green tband   Treatment                            06/20/22:  Manual: prone rib mobs, STM to upper traps, IASTM across bil mid traps with arms resting on lowered arm rests to abduct scapula Standing lateral sway with scaption stretch over blue swiss ball- overpressure applied to assist in upper trap inhibition & first rib depression on left side Standing lat press down onto blue swiss ball Standing pec stretch- pronated position reduces impingement   Treatment                            06/13/22:  Prone rib mobilization, STM Lt scapula Hip hinge to toilet seat using rolling walker Scapular retraction with  bil UE ER holding 1lb Reach and row back 1lb each hand Scap retraction with small shoulder  extension Gait with arm swing & trunk rotation Ktape bil deltoids    PATIENT EDUCATION:  Education details: Anatomy of condition, POC, HEP, exercise form/rationale Person educated: Patient Education method: Explanation, Demonstration, Tactile cues, Verbal cues, and Handouts Education comprehension: verbalized understanding, returned demonstration, verbal cues required, tactile cues required, and needs further education   HOME EXERCISE PROGRAM: [id number]  ASSESSMENT:  CLINICAL IMPRESSION: Fatigued in prone scap retraction on Left side. Lacking strength and endurance down post chain for bent over activities- encouraged her to add upright rows to squats she is doing for pelvic flooor to begin bridging the gap.   OBJECTIVE IMPAIRMENTS decreased activity tolerance, difficulty walking, decreased ROM, decreased strength, increased muscle spasms, impaired flexibility, impaired sensation, improper body mechanics, postural dysfunction, and pain.   ACTIVITY LIMITATIONS carrying, lifting, bending, sitting, standing, squatting, sleeping, stairs, transfers, bed mobility, bathing, toileting, dressing, reach over head, hygiene/grooming, locomotion level, and caring for others  PARTICIPATION LIMITATIONS: meal prep, cleaning, laundry, driving, shopping, and community activity  PERSONAL FACTORS 3+ comorbidities: see PMH  are also affecting patient's functional outcome.     GOALS: Goals reviewed with patient? Yes  SHORT TERM GOALS: Target date: 02/21/2022  GHJ IR to midline bilaterally Baseline: Goal status: achieved when done passively, pt reported Lt AC joint pain  2.  Pt will verbalize awareness of equal WB and more frequent corrections Baseline:  Goal status: achieved  3.  Independent in HEP as it has been established in the short term Baseline:  Goal status: achieved   LONG TERM GOALS: Target date: POC date  Pt will meet FOTO goals for hip and shoulder Baseline:  Goal status:  ongoing  2.  6 MWT to distance greater than 50-59 age normative values Baseline: 10' on 12/29 Goal status:  3.  Able to fix hair independently pain <=3/10 Baseline: severe pain still ongoing but I feel like I can move it better Goal status:ongoing  4.  Able to dress with minimal difficulty for shoulders and hip Baseline:  Goal status: ongoing  5.  Able to navigate stairs with reciprocal pattern Baseline:  Goal status: ongoing  6.  Able to tolerate ADLs without requiring long periods in her bed due to pain Baseline:  Goal status:ongoing   PLAN: PT FREQUENCY: 1-2x/week  PT DURATION: 10 weeks  PLANNED INTERVENTIONS: Therapeutic exercises, Therapeutic activity, Neuromuscular re-education, Balance training, Gait training, Patient/Family education, Self Care, Joint mobilization, Stair training, Aquatic Therapy, Dry Needling, Electrical stimulation, Spinal mobilization, Cryotherapy, Moist heat, Taping, Manual therapy, and Re-evaluation  PLAN FOR NEXT SESSION: continue prone manual work, mobility as tolerated.   Nestor Wieneke C. Adair Lemar PT, DPT 07/16/22 11:46 AM

## 2022-07-21 ENCOUNTER — Ambulatory Visit: Payer: PPO

## 2022-07-21 DIAGNOSIS — M6281 Muscle weakness (generalized): Secondary | ICD-10-CM

## 2022-07-21 DIAGNOSIS — M62838 Other muscle spasm: Secondary | ICD-10-CM

## 2022-07-21 DIAGNOSIS — R293 Abnormal posture: Secondary | ICD-10-CM | POA: Diagnosis not present

## 2022-07-21 DIAGNOSIS — R279 Unspecified lack of coordination: Secondary | ICD-10-CM

## 2022-07-21 DIAGNOSIS — M25552 Pain in left hip: Secondary | ICD-10-CM

## 2022-07-21 NOTE — Therapy (Signed)
OUTPATIENT PHYSICAL THERAPY TREATMENT NOTE  See note below for Objective Data and Assessment of Progress/Goals.    Patient Name: Katelyn [name] MRN: [id number] DOB:1975-12-15, 47 y.o., female Today's Date: 07/21/2022  PCP: Kathyrn Drown, MD  REFERRING PROVIDER: Vira Agar, MD  END OF SESSION:   PT End of Session - 07/21/22 0801     Visit Number [id number]    Date for PT Re-Evaluation 09/01/22    Authorization Type HTA    Authorization Time Period 4    Authorization - Visit Number 10    PT Start Time 0800    PT Stop Time 0840    PT Time Calculation (min) 40 min    Activity Tolerance Patient tolerated treatment well    Behavior During Therapy [facility] for tasks assessed/performed               Past Medical History:  Diagnosis Date   Anemia    Anorexia    Anxiety    Asthma    controlled with meds   Benign juvenile melanoma    Chronic headaches    Colon polyps    found on colonoscopy [id number]   Complication of anesthesia    itching after epidural for c section   Constipation    Depression    Depression    several suicide attempts, hospitaluzed in 2012 for this; hx pf ECT treatments ; pt sees Dr. Adele Schilder pyschiatrist  and doing well on medication   Dysrhythmia    hx of PVC's (pt hasn't seen cardiologist since 2021- no follow up needed)   GERD (gastroesophageal reflux disease)    Headache    Heart murmur    as a child - no one mentions hearing murmur anymore, sometimes it is heard. Echo has been performed 02/03/18   Heartburn    no meds   History of pneumonia    x 3  years ago - no recent problems   Hx of blood clots    Left renal atrophy 12/19/2021   Neuromuscular disorder (HCC)    small fiber neuropathy   Obesity    Pneumonia    Polycystic ovary    takes metformin to treat   Renal atrophy, left 2023   Skin lesion    Excisional biopsy of moles - none cancerous   Sleep apnea    yrs ago - diagnosed mild sleep apnea - did not have to use cpap    Past  Surgical History:  Procedure Laterality Date   BREAST CAPSULECTOMY Bilateral 12/13/2020   1 week later pt had hematoma surgery on left breast   BREAST ENHANCEMENT SURGERY Bilateral 02/11/2017   BREATH TEK H PYLORI N/A 07/22/2013   Procedure: BREATH TEK H PYLORI;  Surgeon: Edward Jolly, MD;  Location: [facility];  Service: General;  Laterality: N/A;   CESAREAN SECTION  2004, 2007   x 2   COLONOSCOPY  2013   CYSTOSCOPY WITH URETHRAL DILATATION  age 35    GASTRIC ROUX-EN-Y N/A 10/31/2013   Procedure: LAPAROSCOPIC ROUX-EN-Y GASTRIC BYPASS WITH UPPER ENDOSCOPY;  Surgeon: Edward Jolly, MD;  Location: [facility];  Service: General;  Laterality: N/A;   GLUTEUS MINIMUS REPAIR Left 05/28/2021   Procedure: Left [place];  Surgeon: Vanetta Mulders, MD;  Location: [facility];  Service: Orthopedics;  Laterality: Left;   ingrown toenail Right    Ingrown nail on great right toe   kidney stent  08/28/2014   mole excision     "benign  juvenile melanoma" removed from left leg - inner thigh   POLYPECTOMY     2013   SKIN LESION EXCISION     back   TONSILLECTOMY  age 27   TUBAL LIGATION  2018   Patient Active Problem List   Diagnosis Date Noted   Left renal atrophy 12/19/2021   Stage 2 chronic kidney disease 11/20/2021   Meningitis 09/27/2021   Epigastric pain 09/27/2021   GERD (gastroesophageal reflux disease) 09/27/2021   Rash and nonspecific skin eruption 09/27/2021   Hypocalcemia 09/27/2021   Chronic pain syndrome 09/27/2021   Sepsis ([place])    Fever 09/25/2021   Tear of left gluteus medius tendon    Asthma 07/18/2020   Vitamin B 12 deficiency 10/16/2019   Elevated LFTs 10/16/2019   Asthma, exogenous, unspecified asthma severity, uncomplicated [id number]   B12 deficiency anemia 06/21/2018   Venous insufficiency 04/23/2018   Onychomycosis of toenail 04/17/2018   Chronic myofascial pain 03/18/2018   Small fiber neuropathy 03/18/2018   Idiopathic small fiber sensory  neuropathy 02/23/2018   Varicose veins of both lower extremities 01/13/2018   Cervical spondylosis without myelopathy 08/28/2017   Migraine with aura and without status migrainosus, not intractable 07/22/2017   Lumbar degenerative disc disease 07/20/2017   Spondylosis of lumbar spine 07/20/2017   Coccydynia 03/12/2017   Bariatric surgery status 05/02/2016   Major depressive disorder, recurrent episode, moderate ([place]) 02/18/2016   Tachy-brady syndrome ([place]) 12/19/2015   Iron deficiency anemia 06/10/2015   Sleep disorder 06/01/2013   Generalized anxiety disorder 05/30/2013   Chronic headaches 07/01/2012   Headache 07/01/2012   Depression, major, recurrent, severe with psychosis ([place]) 06/17/2012   Severe recurrent major depressive disorder with psychotic symptoms ([place]) 06/17/2012   Family history of colonic polyps 03/03/2012   Eating disorder 12/18/2011   Nausea & vomiting 01/22/2010   Polycystic ovarian syndrome 09/01/2008   Intermittent left-sided chest pain 08/02/2008    REFERRING DIAG: M25.511,G89.29,M25.512 (ICD-10-CM) - Chronic pain of both shoulders S76.012A (ICD-10-CM) - Tear of left gluteus medius tendon, initial encounter  THERAPY DIAG:  Abnormal posture  Muscle weakness (generalized)  Other muscle spasm  Unspecified lack of coordination  Pain in left hip  Rationale for Evaluation and Treatment Rehabilitation  PERTINENT HISTORY: Breast augmentation surgeries with follow-up reduction with complication, c/s, gastric bypass, bil tubal ligation, long history of chronic low back/pelvic pain, pudendal neuralgia, coccydynia, Lt glute med repair  PRECAUTIONS: NA  SUBJECTIVE:                                                                                                                                                                                      SUBJECTIVE  STATEMENT:  Pt states that her low back is really hurting her. Over a week ago she played a game of pool and  she believes that the slightly bent over position was very aggravating. This was followed by leaning over a ball for an exercise in PT. She feels the same pain when she leans over to put on eye make up or unloading dishwasher. She had to walk down gravel hill and that has Lt hip aggravated.   PAIN:  Are you having pain? Yes,9/10, Bil hip/low back   03/03/22 SUBJECTIVE STATEMENT: Pt had surgery for Lt glute med tear that most likely happened during fall 3 years ago. She has still had pain in Lt hip, but she is in rehab due to it. She had meningitis in June and ended up having Bil frozen shoulders. Her biggest complaint is of back pain, which impacts her bil shoulder pain because she has to lay on either side. She mostly lies on Rt side, which causes pain in Rt low back and Rt hip. She is rarely getting pressure off of buttocks. She continues to have sacral/coccydynia with sitting and avoids this position. She is not currently having vaginal/pelvic pain, but states this is coming and going - she manages by decreasing frequency of intercourse - no 1x/week. She is having issue with Lt kidney, but MD states that urinary issues are not related.  Is currently in PT for hip and shoulders - primary working on shoulders. Is only doing some basic leg strengthening and shoulder exercises at home.  Fluid intake: Yes: -     PAIN:  Are you having pain? Yes NPRS scale: 8/10Right and Left Pain location:    Pain type: sharp and pressure Pain description: constant    Aggravating factors: sitting, driving, finding comfortable position Relieving factors: lying on stomach relieves buttock pain if she can, but shoulders prevent this   PRECAUTIONS: None   WEIGHT BEARING RESTRICTIONS: No   FALLS:  Has patient fallen in last 6 months? No   LIVING ENVIRONMENT: Lives with: lives with their family Lives in: House/apartment   OCCUPATION: disabled   PLOF: Independent   PATIENT GOALS: would like to be able to  sit; decrease bil hip/low back pain   PERTINENT HISTORY:  Breast augmentation surgeries with follow-up reduction with complication, c/s, gastric bypass, bil tubal ligation, long history of chronic low back/pelvic pain, pudendal neuralgia, coccydynia, Lt glute med repair Sexual abuse: No   BOWEL MOVEMENT: Pain with bowel movement: No Type of bowel movement:Frequency 1x/day and Strain Yes Fully empty rectum: Yes: - Leakage: No Pads: No Fiber supplement: No no medication to help stimulate bowel movements   URINATION: Pain with urination: No Fully empty bladder: No Stream:  trouble starting the stream Urgency: no Frequency: every 30 minutes to an hour - worse once she does go, then she has to keep going - easier to just hold it Leakage:  none Pads: No   INTERCOURSE: Pain with intercourse: Initial Penetration and During Penetration Ability to have vaginal penetration:  Yes: pain - only tolerates 1x/week Climax: yes Marinoff Scale: 2/3   PREGNANCY: Vaginal deliveries 0 Tearing No C-section deliveries 2 Currently pregnant No       OBJECTIVE:  06/23/22: Significant restriction in Rt hip with trigger points palpated Rt posterior/Lt anterior innominate rotation possible in pelvic  03/03/22: DIAGNOSTIC FINDINGS:  Lasix renogram with no abnormal findings   PATIENT SURVEYS:    PFIQ-7 100%   COGNITION: Overall cognitive status: Within functional  limits for tasks assessed                          SENSATION: Light touch: Appears intact Proprioception: Appears intact   MUSCLE LENGTH:     FUNCTIONAL TESTS:              5xSTS: 29 seconds   GAIT:   Comments: decreased bil hip extension, posterior pelvic tilt   POSTURE: rounded shoulders, forward head, increased lumbar lordosis, and posterior pelvic tilt   PELVIC ALIGNMENT:   LUMBARAROM/PROM:   A/PROM A/PROM  Eval (%)  Flexion 50, pain on Rt  Extension 20, no pain, very limited mobility  Right lateral flexion 50,  pain in Rt low back  Left lateral flexion 75, pain in Rt low back  Right rotation 50, good stretch  Left rotation 50, good stretch   (Blank rows = not tested)     LOWER EXTREMITY MMT: tested in standing due to comfort             Rt: 3/5 hip abduction and extension, 4/5 adduction and flexion             Lt: 3/5 hip abduction, extension, flexion, 4/5 adduction     PALPATION:   General  tenderness throughout bil lumbar paraspinals and gluts, Lt>Rt; tenderness and spasm in thoracic paraspinals with decreased mobility upon deep breathing; scar tissue restriction under bil breasts that is tender; scar tissue restriction Lt glut med surgical incision                 External Perineal Exam NA                             Internal Pelvic Floor NA   Patient confirms identification and approves PT to assess internal pelvic floor and treatment Yes for future need   PELVIC MMT:   MMT eval  Vaginal    Internal Anal Sphincter    External Anal Sphincter    Puborectalis    Diastasis Recti    (Blank rows = not tested)         TONE: NA   PROLAPSE: NA   TREATMENT 07/21/22 Manual: Soft tissue mobilization to low back/hips Myofascial release of low back and bil hips Neuromuscular re-education: Transversus abdominus training with multimodal cues for improved motor control and breath coordination Standing bil UE ball press isometric core activation with breath 10x Standing side UE ball press into wall with isometric core activation and breath 10x bil    TREATMENT 07/08/22 Manual: Soft tissue mobilization to low back/hips Myofascial release of low back and bil hips Therapeutic activities: Squat training in parallel bars in order to achieve better descent to toilet   TREATMENT 07/02/22 Manual: Trigger Point Dry-Needling  Treatment instructions: Expect mild to moderate muscle soreness. S/S of pneumothorax if dry needled over a lung field, and to seek immediate medical attention should  they occur. Patient verbalized understanding of these instructions and education.  Patient Consent Given: Yes Education handout provided: Previously provided Muscles treated: Rt glutes/piriformis Electrical stimulation performed: No Parameters: N/A Treatment response/outcome: twitch response/release Soft tissue mobilization to lumbar paraspinals, bil glutes   PATIENT EDUCATION:  Education details: see above self-care Person educated: Patient Education method: Explanation, Demonstration, Tactile cues, Verbal cues, and Handouts Education comprehension: verbalized understanding   HOME EXERCISE PROGRAM: Written handout   ASSESSMENT:   CLINICAL IMPRESSION: Pt had very high  pain levels today at 9/10. Believe this is due to lack of core control in standing positions with small amounts of lumbar flexion. We re-visited transversus abdominus activation and she was able to perform gentle isometric core contractions in standing with minimal shoulder discomfort. She was instructed to do these at home with emphasis in breath coordination and slow pacing of activity. Manual techniques performed to low back, sacrum, coccyx, and bil hips with excellent reduction of pain to 6/10. She will continue to benefit from skilled PT intervention in order to decrease pain, improve functional ability, increase ease of starting stream/emptying bladder, and improve QOL.    OBJECTIVE IMPAIRMENTS: decreased activity tolerance, decreased coordination, decreased endurance, decreased mobility, decreased ROM, decreased strength, hypomobility, increased fascial restrictions, increased muscle spasms, impaired flexibility, impaired tone, postural dysfunction, and pain.    ACTIVITY LIMITATIONS: bending, sitting, standing, stairs, transfers, bed mobility, and locomotion level   PARTICIPATION LIMITATIONS: cleaning, laundry, interpersonal relationship, driving, and community activity   PERSONAL FACTORS: 3+ comorbidities: Breast  augmentation surgeries with follow-up reduction with complication, c/s, gastric bypass, bil tubal ligation, long history of chronic low back/pelvic pain, pudendal neuralgia, coccydynia, Lt glut med repair  are also affecting patient's functional outcome.    REHAB POTENTIAL: Good   CLINICAL DECISION MAKING: Stable/uncomplicated   EVALUATION COMPLEXITY: Low     GOALS: Goals reviewed with patient? Yes   SHORT TERM GOALS: Target date: 03/31/2022 - updated 03/26/22 updated 05/14/22 - updated 06/16/22 - updated 2/26 new target 09/01/2022   Pt will be independent with HEP.    Baseline: Goal status: MET 03/26/22   2.  Pt will be independent with diaphragmatic breathing and down training activities in order to improve pelvic floor relaxation and abdominal wall mobility.   Baseline:  Goal status: MET 05/14/22   3.  Pt will report improve stream initiation and complete voiding with urination in order to improve QOL. Baseline:  Goal status: IN PROGRESS              4.  Pt will increase all impaired lumbar A/ROM by 25% without pain.   Baseline:  Goal status: IN PROGRESS   LONG TERM GOALS: Target date: 04/28/2022 - updated 03/26/22 - updated 05/14/22 - updated 06/16/22 - updated 06/23/22 new target 09/01/2022   Pt will be independent with advanced HEP.    Baseline:  Goal status:IN PROGRESS   2.  Pt will be able to use regular toilet without difficulty. Baseline: Pt has returned to using toilet chair 100% of the time due to increase in low back pain Goal status: IN PROGRESS   3.  Pt will be able to ascend/descend 12 steps at regular intervals throughout the day safely and with minimal difficulty.  Baseline: Pt has not been working on steps due to increase in low back pain Goal status: IN PROGRESS   4.  Pt will improve 5xSTS to less than 20 seconds and improved PFIQ by 25% in order to demonstrate improved QOL and functional ability.  Baseline: 28 seconds Goal status: IN PROGRESS   5.  Patient  will report no greater than 5/10 pain with sitting in order to more easily attend appointments, drive, and spend time with family.  Baseline:  Goal status:IN PROGRESS   6.  Pt will demonstrate increase in all impaired hip strength by 1 muscle grades in order to demonstrate improved lumbopelvic support and increase functional ability.      Baseline:  Goal status: IN PROGRESS   PLAN:  PT FREQUENCY: 1x/week   PT DURATION: 8 weeks   PLANNED INTERVENTIONS: Therapeutic exercises, Therapeutic activity, Neuromuscular re-education, Balance training, Gait training, Patient/Family education, Self Care, Joint mobilization, Aquatic Therapy, Dry Needling, Biofeedback, and Manual therapy   PLAN FOR NEXT SESSION: continue manual techniques as needed; progress mobility exercises.    Heather Roberts, PT, DPT03/25/248:50 AM

## 2022-07-22 DIAGNOSIS — F411 Generalized anxiety disorder: Secondary | ICD-10-CM | POA: Diagnosis not present

## 2022-07-22 DIAGNOSIS — F331 Major depressive disorder, recurrent, moderate: Secondary | ICD-10-CM | POA: Diagnosis not present

## 2022-07-22 DIAGNOSIS — F5001 Anorexia nervosa, restricting type: Secondary | ICD-10-CM | POA: Diagnosis not present

## 2022-07-23 ENCOUNTER — Encounter (HOSPITAL_BASED_OUTPATIENT_CLINIC_OR_DEPARTMENT_OTHER): Payer: PPO | Admitting: Physical Therapy

## 2022-07-25 ENCOUNTER — Inpatient Hospital Stay: Payer: PPO | Attending: Hematology

## 2022-07-25 DIAGNOSIS — R7989 Other specified abnormal findings of blood chemistry: Secondary | ICD-10-CM

## 2022-07-25 DIAGNOSIS — D72829 Elevated white blood cell count, unspecified: Secondary | ICD-10-CM | POA: Diagnosis not present

## 2022-07-25 DIAGNOSIS — E611 Iron deficiency: Secondary | ICD-10-CM | POA: Insufficient documentation

## 2022-07-25 DIAGNOSIS — D513 Other dietary vitamin B12 deficiency anemia: Secondary | ICD-10-CM

## 2022-07-25 DIAGNOSIS — D508 Other iron deficiency anemias: Secondary | ICD-10-CM

## 2022-07-25 LAB — COMPREHENSIVE METABOLIC PANEL
ALT: 21 U/L (ref 0–44)
AST: 19 U/L (ref 15–41)
Albumin: 4.4 g/dL (ref 3.5–5.0)
Alkaline Phosphatase: 104 U/L (ref 38–126)
Anion gap: 9 (ref 5–15)
BUN: 12 mg/dL (ref 6–20)
CO2: 27 mmol/L (ref 22–32)
Calcium: 8.9 mg/dL (ref 8.9–10.3)
Chloride: 99 mmol/L (ref 98–111)
Creatinine, Ser: 0.77 mg/dL (ref 0.44–1.00)
GFR, Estimated: >60 mL/min (ref >60)
Glucose, Bld: 99 mg/dL (ref 70–99)
Potassium: 4.1 mmol/L (ref 3.5–5.1)
Sodium: 135 mmol/L (ref 135–145)
Total Bilirubin: 0.5 mg/dL (ref 0.3–1.2)
Total Protein: 7.4 g/dL (ref 6.5–8.1)

## 2022-07-25 LAB — CBC WITH DIFFERENTIAL/PLATELET
Abs Immature Granulocytes: 0.11 10*3/uL — ABNORMAL HIGH (ref 0.00–0.07)
Basophils Absolute: 0.1 10*3/uL (ref 0.0–0.1)
Basophils Relative: 0 %
Eosinophils Absolute: 0.1 10*3/uL (ref 0.0–0.5)
Eosinophils Relative: 0 %
HCT: 44.9 % (ref 36.0–46.0)
Hemoglobin: 14.8 g/dL (ref 12.0–15.0)
Immature Granulocytes: 1 %
Lymphocytes Relative: 12 %
Lymphs Abs: 2.3 10*3/uL (ref 0.7–4.0)
MCH: 32.1 pg (ref 26.0–34.0)
MCHC: 33 g/dL (ref 30.0–36.0)
MCV: 97.4 fL (ref 80.0–100.0)
Monocytes Absolute: 0.9 10*3/uL (ref 0.1–1.0)
Monocytes Relative: 5 %
Neutro Abs: 15.1 10*3/uL — ABNORMAL HIGH (ref 1.7–7.7)
Neutrophils Relative %: 82 %
Platelets: 372 10*3/uL (ref 150–400)
RBC: 4.61 MIL/uL (ref 3.87–5.11)
RDW: 12.5 % (ref 11.5–15.5)
WBC: 18.6 10*3/uL — ABNORMAL HIGH (ref 4.0–10.5)
nRBC: 0 % (ref 0.0–0.2)

## 2022-07-25 LAB — IRON AND TIBC
Iron: 19 ug/dL — ABNORMAL LOW (ref 28–170)
Saturation Ratios: 7 % — ABNORMAL LOW (ref 10.4–31.8)
TIBC: 273 ug/dL (ref 250–450)
UIBC: 254 ug/dL

## 2022-07-25 LAB — FERRITIN: Ferritin: 202 ng/mL (ref 11–307)

## 2022-07-25 LAB — VITAMIN B12: Vitamin B-12: 1043 pg/mL — ABNORMAL HIGH (ref 180–914)

## 2022-07-29 ENCOUNTER — Encounter: Payer: Self-pay | Admitting: Hematology

## 2022-07-30 ENCOUNTER — Inpatient Hospital Stay: Payer: PPO | Attending: Physician Assistant | Admitting: Physician Assistant

## 2022-07-30 ENCOUNTER — Inpatient Hospital Stay: Payer: PPO

## 2022-07-30 ENCOUNTER — Ambulatory Visit (HOSPITAL_BASED_OUTPATIENT_CLINIC_OR_DEPARTMENT_OTHER): Payer: PPO | Admitting: Orthopaedic Surgery

## 2022-07-30 VITALS — BP 154/96 | HR 103 | Temp 98.7°F | Resp 18

## 2022-07-30 DIAGNOSIS — D72829 Elevated white blood cell count, unspecified: Secondary | ICD-10-CM | POA: Diagnosis not present

## 2022-07-30 DIAGNOSIS — D513 Other dietary vitamin B12 deficiency anemia: Secondary | ICD-10-CM

## 2022-07-30 DIAGNOSIS — D75839 Thrombocytosis, unspecified: Secondary | ICD-10-CM | POA: Insufficient documentation

## 2022-07-30 DIAGNOSIS — Z79899 Other long term (current) drug therapy: Secondary | ICD-10-CM | POA: Diagnosis not present

## 2022-07-30 DIAGNOSIS — D751 Secondary polycythemia: Secondary | ICD-10-CM | POA: Insufficient documentation

## 2022-07-30 DIAGNOSIS — D508 Other iron deficiency anemias: Secondary | ICD-10-CM

## 2022-07-30 LAB — CBC WITH DIFFERENTIAL/PLATELET
Abs Immature Granulocytes: 0.09 10*3/uL — ABNORMAL HIGH (ref 0.00–0.07)
Basophils Absolute: 0.1 10*3/uL (ref 0.0–0.1)
Basophils Relative: 1 %
Eosinophils Absolute: 0.1 10*3/uL (ref 0.0–0.5)
Eosinophils Relative: 1 %
HCT: 46.2 % — ABNORMAL HIGH (ref 36.0–46.0)
Hemoglobin: 15.2 g/dL — ABNORMAL HIGH (ref 12.0–15.0)
Immature Granulocytes: 1 %
Lymphocytes Relative: 16 %
Lymphs Abs: 2.5 10*3/uL (ref 0.7–4.0)
MCH: 32.2 pg (ref 26.0–34.0)
MCHC: 32.9 g/dL (ref 30.0–36.0)
MCV: 97.9 fL (ref 80.0–100.0)
Monocytes Absolute: 0.7 10*3/uL (ref 0.1–1.0)
Monocytes Relative: 5 %
Neutro Abs: 12.3 10*3/uL — ABNORMAL HIGH (ref 1.7–7.7)
Neutrophils Relative %: 76 %
Platelets: 459 10*3/uL — ABNORMAL HIGH (ref 150–400)
RBC: 4.72 MIL/uL (ref 3.87–5.11)
RDW: 12.6 % (ref 11.5–15.5)
WBC: 15.8 10*3/uL — ABNORMAL HIGH (ref 4.0–10.5)
nRBC: 0 % (ref 0.0–0.2)

## 2022-07-30 LAB — METHYLMALONIC ACID, SERUM: Methylmalonic Acid, Quantitative: 196 nmol/L (ref 0–378)

## 2022-07-30 LAB — TSH: TSH: 2.103 u[IU]/mL (ref 0.350–4.500)

## 2022-07-30 LAB — C-REACTIVE PROTEIN: CRP: 3.1 mg/dL — ABNORMAL HIGH (ref <1.0)

## 2022-07-30 LAB — SEDIMENTATION RATE: Sed Rate: 40 mm/hr — ABNORMAL HIGH (ref 0–22)
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
## 2022-07-30 NOTE — Patient Instructions (Signed)
[facility] [place] **   You were seen today by Tarri Abernethy PA-C for your follow-up visit.    HIGH WHITE BLOOD CELLS We do not know the cause of your elevated white blood cells.  I suspect that it may be "reactive" to some underlying inflammation. However, we will check labs today to make sure you do not have any DNA mutations associated with certain types of blood cancers.  OTHER HEMATOLOGY ISSUES Your blood and iron levels are adequate right now.  You do not need any IV iron. Your vitamin B12 levels have come down to almost normal.  OTHER SYMPTOMS I do not know the cause of your other symptoms, but we will check some labs today that may assist your primary care doctor in further testing. I will also send a message to your primary care doctor to let him know what I have observed.  He should be reaching out to you for additional testing at his discretion.  FOLLOW-UP APPOINTMENT: We will schedule you for PHONE visit in 2 weeks to discuss lab results.  ** Thank you for trusting me with your healthcare!  I strive to provide all of my patients with quality care at each visit.  If you receive a survey for this visit, I would be so grateful to you for taking the time to provide feedback.  Thank you in advance!  ~ Kaydin Labo                   Dr. Derek Jack   &   Tarri Abernethy, PA-C   - - - - - - - - - - - - - - - - - -    Thank you for choosing [facility] at [facility] to provide your oncology and hematology care.  To afford each patient quality time with our provider, please arrive at least 15 minutes before your scheduled appointment time.   If you have a lab appointment with the [facility] please come in thru the Main Entrance and check in at the main information desk.  You need to re-schedule your appointment should you arrive 10 or more minutes late.  We strive to give you  quality time with our providers, and arriving late affects you and other patients whose appointments are after yours.  Also, if you no show three or more times for appointments you may be dismissed from the clinic at the providers discretion.     Again, thank you for choosing [facility].  Our hope is that these requests will decrease the amount of time that you wait before being seen by our physicians.       _____________________________________________________________  Should you have questions after your visit to [name], please contact our office at [phone] and follow the prompts.  Our office hours are 8:00 a.m. and 4:30 p.m. Monday - Friday.  Please note that voicemails left after 4:00 p.m. may not be returned until the following business day.  We are closed weekends and major holidays.  You do have access to a nurse 24-7, just call the main number to the clinic [phone] and do not press any options, hold on the line and a nurse will answer the phone.    For prescription refill requests, have your pharmacy contact our office and allow 72 hours.

## 2022-07-31 ENCOUNTER — Other Ambulatory Visit (HOSPITAL_COMMUNITY)
Admission: RE | Admit: 2022-07-31 | Discharge: 2022-07-31 | Disposition: A | Discharge status: Home / Self Care | Payer: PPO | Source: Ambulatory Visit | Attending: Family Medicine | Admitting: Family Medicine

## 2022-07-31 ENCOUNTER — Telehealth: Payer: Self-pay | Admitting: Family Medicine

## 2022-07-31 ENCOUNTER — Ambulatory Visit (INDEPENDENT_AMBULATORY_CARE_PROVIDER_SITE_OTHER): Payer: PPO | Admitting: Family Medicine

## 2022-07-31 VITALS — BP 129/83 | HR 104 | Temp 97.5°F | Ht 65.5 in

## 2022-07-31 DIAGNOSIS — D72829 Elevated white blood cell count, unspecified: Secondary | ICD-10-CM

## 2022-07-31 DIAGNOSIS — R0789 Other chest pain: Secondary | ICD-10-CM

## 2022-07-31 DIAGNOSIS — R0609 Other forms of dyspnea: Secondary | ICD-10-CM

## 2022-07-31 DIAGNOSIS — R1031 Right lower quadrant pain: Secondary | ICD-10-CM | POA: Diagnosis not present

## 2022-07-31 DIAGNOSIS — R7989 Other specified abnormal findings of blood chemistry: Secondary | ICD-10-CM

## 2022-07-31 LAB — TROPONIN I (HIGH SENSITIVITY): Troponin I (High Sensitivity): 3 ng/L (ref <18)

## 2022-07-31 LAB — RHEUMATOID FACTOR: Rheumatoid fact SerPl-aCnc: <10 IU/mL (ref <14.0)

## 2022-07-31 LAB — D-DIMER, QUANTITATIVE: D-Dimer, Quant: 0.84 ug/mL-FEU — ABNORMAL HIGH (ref 0.00–0.50)

## 2022-07-31 NOTE — Progress Notes (Signed)
   Subjective:    Patient ID: Katelyn [name], female    DOB: 01-12-1976, 47 y.o.   MRN: [id number]  HPI Patient has been having pain in chest , tachycardia, and RLQ ABD pain  Having sharp pains in the right lower quadrant of the abdomen present over the past several weeks.  Sometimes severe enough to double her over.  Other times with nausea.  No bloody stools.  No hematemesis.  Worsening pain with sitting and standing.  Does not wake her up at night.  When the pain occurs it can be anywhere from a short span of time to several hours.  Patient also has significant elevation of the white blood count which is much higher than what it has been.  Denies fever chills but does relate some sweats.  Denies wheezing  Mild DOE noted.  Review of Systems     Objective:   Physical Exam General-in no acute distress Eyes-no discharge Lungs-respiratory rate normal, CTA CV-no murmurs,RRR Extremities skin warm dry no edema Neuro grossly normal Behavior normal, alert Abdomen is soft with mid abdominal pain and discomfort left lower quadrant tenderness and right lower quadrant tenderness worse on the right side  EKG no acute changes noted     Assessment & Plan:  1. Chest discomfort Intermittent sharp chest pains unlikely to be cardiovascular.  If progressive troubles may need cardiology consult to come in right now we will lean more toward doing an echo - EKG 12-Lead  2. DOE (dyspnea on exertion) We will look toward doing an echo but first need to do D-dimer will also do troponin I but the likelihood of this being a heart attack is very low.  If D-dimer is elevated will need CT angio of chest if D-dimer is negative then will pursue echo - D-dimer, quantitative - Troponin I  3. RLQ abdominal pain Pursue forward with CT of the abdomen and pelvis with contrast.  This is because of intermittent right lower quadrant pain and discomfort lower abdominal pain and discomfort present over the past few  weeks worse in the past week.  With significant elevation of white blood count of 15,800 as well as elevated CRP  4. Leukocytosis, unspecified type Some lab test pending from hematology.  Patient will need further workup.

## 2022-07-31 NOTE — Telephone Encounter (Signed)
D-dimer came back elevated Patient having DOE shortness of breath Needs stat CTA of the chest pulmonary embolism protocol to rule out pulmonary embolism.  Patient is aware that we are getting this set up.  She states she feels too bad to go back to the hospital this evening because of her underlying medical condition therefore set up the test for tomorrow morning.

## 2022-08-01 ENCOUNTER — Telehealth: Payer: Self-pay | Admitting: Family Medicine

## 2022-08-01 ENCOUNTER — Inpatient Hospital Stay: Payer: PPO | Admitting: Physician Assistant

## 2022-08-01 ENCOUNTER — Ambulatory Visit (HOSPITAL_COMMUNITY)
Admission: RE | Admit: 2022-08-01 | Discharge: 2022-08-01 | Disposition: A | Discharge status: Home / Self Care | Payer: PPO | Source: Ambulatory Visit | Attending: Family Medicine | Admitting: Family Medicine

## 2022-08-01 ENCOUNTER — Other Ambulatory Visit: Payer: Self-pay

## 2022-08-01 DIAGNOSIS — R079 Chest pain, unspecified: Secondary | ICD-10-CM | POA: Diagnosis not present

## 2022-08-01 DIAGNOSIS — R0609 Other forms of dyspnea: Secondary | ICD-10-CM | POA: Diagnosis not present

## 2022-08-01 DIAGNOSIS — J9811 Atelectasis: Secondary | ICD-10-CM | POA: Diagnosis not present

## 2022-08-01 DIAGNOSIS — R1031 Right lower quadrant pain: Secondary | ICD-10-CM

## 2022-08-01 DIAGNOSIS — R0602 Shortness of breath: Secondary | ICD-10-CM | POA: Diagnosis not present

## 2022-08-01 DIAGNOSIS — D72829 Elevated white blood cell count, unspecified: Secondary | ICD-10-CM

## 2022-08-01 LAB — T4: T4, Total: 9.6 ug/dL (ref 4.5–12.0)

## 2022-08-01 LAB — ANA: Anti Nuclear Antibody (ANA): NEGATIVE

## 2022-08-01 MED ORDER — IOHEXOL 350 MG/ML SOLN
75.0000 mL | Freq: Once | INTRAVENOUS | Status: AC | PRN
  Administered 2022-08-01: 75 mL via INTRAVENOUS

## 2022-08-01 NOTE — Telephone Encounter (Signed)
CT ordered in EPIC, referral coordinator notified.. Radiology stated it takes several days to process and remove contrast from body but it should be clear by mid next week

## 2022-08-01 NOTE — Telephone Encounter (Signed)
Patient scheduled today 08/01/22 at 10:30am at [facility]

## 2022-08-01 NOTE — Telephone Encounter (Signed)
Please go ahead and set up for CT abdomen pelvis with contrast due to right lower quadrant pain and elevated white blood cell count.  I would like to have this CT scan done next week.  Please call CT and find out from them how many days is typically recommended to wait until doing the next scan.  Thank you

## 2022-08-02 LAB — BCR-ABL1 FISH
Cells Analyzed: 200
Cells Counted: 200

## 2022-08-04 ENCOUNTER — Ambulatory Visit: Payer: PPO | Attending: Urology

## 2022-08-04 DIAGNOSIS — M25552 Pain in left hip: Secondary | ICD-10-CM | POA: Diagnosis not present

## 2022-08-04 DIAGNOSIS — M6281 Muscle weakness (generalized): Secondary | ICD-10-CM | POA: Diagnosis not present

## 2022-08-04 DIAGNOSIS — R293 Abnormal posture: Secondary | ICD-10-CM | POA: Diagnosis not present

## 2022-08-04 DIAGNOSIS — R279 Unspecified lack of coordination: Secondary | ICD-10-CM | POA: Insufficient documentation

## 2022-08-04 DIAGNOSIS — M62838 Other muscle spasm: Secondary | ICD-10-CM | POA: Diagnosis not present

## 2022-08-04 NOTE — Therapy (Signed)
OUTPATIENT PHYSICAL THERAPY TREATMENT NOTE  See note below for Objective Data and Assessment of Progress/Goals.    Patient Name: Katelyn [name] MRN: [id number] DOB:[birth year], 47 y.o., female Today's Date: 08/04/2022  PCP: Babs Sciara, MD  REFERRING PROVIDER: Despina Arias, MD  END OF SESSION:   PT End of Session - 08/04/22 0756     Visit Number 35    Date for PT Re-Evaluation 09/01/22    Authorization Type HTA    Authorization Time Period 5    Authorization - Visit Number 10    PT Start Time 0800    PT Stop Time 0840    PT Time Calculation (min) 40 min    Activity Tolerance Patient tolerated treatment well    Behavior During Therapy [facility] for tasks assessed/performed                Past Medical History:  Diagnosis Date   Anemia    Anorexia    Anxiety    Asthma    controlled with meds   Benign juvenile melanoma    Chronic headaches    Colon polyps    found on colonoscopy 04/26/2012   Complication of anesthesia    itching after epidural for c section   Constipation    Depression    Depression    several suicide attempts, hospitaluzed in 2012 for this; hx pf ECT treatments ; pt sees Dr. Lolly Mustache pyschiatrist  and doing well on medication   Dysrhythmia    hx of PVC's (pt hasn't seen cardiologist since 2021- no follow up needed)   GERD (gastroesophageal reflux disease)    Headache    Heart murmur    as a child - no one mentions hearing murmur anymore, sometimes it is heard. Echo has been performed 02/03/18   Heartburn    no meds   History of pneumonia    x 3  years ago - no recent problems   Hx of blood clots    Left renal atrophy 12/19/2021   Neuromuscular disorder    small fiber neuropathy   Obesity    Pneumonia    Polycystic ovary    takes metformin to treat   Renal atrophy, left 2023   Skin lesion    Excisional biopsy of moles - none cancerous   Sleep apnea    yrs ago - diagnosed mild sleep apnea - did not have to use cpap    Past  Surgical History:  Procedure Laterality Date   BREAST CAPSULECTOMY Bilateral 12/13/2020   1 week later pt had hematoma surgery on left breast   BREAST ENHANCEMENT SURGERY Bilateral 02/11/2017   BREATH TEK H PYLORI N/A 07/22/2013   Procedure: BREATH TEK H PYLORI;  Surgeon: Mariella Saa, MD;  Location: [facility];  Service: General;  Laterality: N/A;   CESAREAN SECTION  2004, 2007   x 2   COLONOSCOPY  2013   CYSTOSCOPY WITH URETHRAL DILATATION  age 14    GASTRIC ROUX-EN-Y N/A 10/31/2013   Procedure: LAPAROSCOPIC ROUX-EN-Y GASTRIC BYPASS WITH UPPER ENDOSCOPY;  Surgeon: Mariella Saa, MD;  Location: [facility];  Service: General;  Laterality: N/A;   GLUTEUS MINIMUS REPAIR Left 05/28/2021   Procedure: Left GLUTEUS Medius REPAIR;  Surgeon: Huel Cote, MD;  Location: [facility];  Service: Orthopedics;  Laterality: Left;   ingrown toenail Right    Ingrown nail on great right toe   kidney stent  08/28/2014   mole excision     "benign  juvenile melanoma" removed from left leg - inner thigh   POLYPECTOMY     2013   SKIN LESION EXCISION     back   TONSILLECTOMY  age 47   TUBAL LIGATION  2018   Patient Active Problem List   Diagnosis Date Noted   Left renal atrophy 12/19/2021   Stage 2 chronic kidney disease 11/20/2021   Meningitis 09/27/2021   Epigastric pain 09/27/2021   GERD (gastroesophageal reflux disease) 09/27/2021   Rash and nonspecific skin eruption 09/27/2021   Hypocalcemia 09/27/2021   Chronic pain syndrome 09/27/2021   Sepsis    Fever 09/25/2021   Tear of left gluteus medius tendon    Asthma 07/18/2020   Vitamin B 12 deficiency 10/16/2019   Elevated LFTs 10/16/2019   Asthma, exogenous, unspecified asthma severity, uncomplicated 01/04/2019   B12 deficiency anemia 06/21/2018   Venous insufficiency 04/23/2018   Onychomycosis of toenail 04/17/2018   Chronic myofascial pain 03/18/2018   Small fiber neuropathy 03/18/2018   Idiopathic small fiber sensory neuropathy  02/23/2018   Varicose veins of both lower extremities 01/13/2018   Cervical spondylosis without myelopathy 08/28/2017   Migraine with aura and without status migrainosus, not intractable 07/22/2017   Lumbar degenerative disc disease 07/20/2017   Spondylosis of lumbar spine 07/20/2017   Coccydynia 03/12/2017   Bariatric surgery status 05/02/2016   Major depressive disorder, recurrent episode, moderate 02/18/2016   Tachy-brady syndrome 12/19/2015   Iron deficiency anemia 06/10/2015   Sleep disorder 06/01/2013   Generalized anxiety disorder 05/30/2013   Chronic headaches 07/01/2012   Headache 07/01/2012   Depression, major, recurrent, severe with psychosis 06/17/2012   Severe recurrent major depressive disorder with psychotic symptoms 06/17/2012   Family history of colonic polyps 03/03/2012   Eating disorder 12/18/2011   Nausea & vomiting 01/22/2010   Polycystic ovarian syndrome 09/01/2008   Intermittent left-sided chest pain 08/02/2008    REFERRING DIAG: M25.511,G89.29,M25.512 (ICD-10-CM) - Chronic pain of both shoulders S76.012A (ICD-10-CM) - Tear of left gluteus medius tendon, initial encounter  THERAPY DIAG:  Abnormal posture  Muscle weakness (generalized)  Other muscle spasm  Unspecified lack of coordination  Pain in left hip  Rationale for Evaluation and Treatment Rehabilitation  PERTINENT HISTORY: Breast augmentation surgeries with follow-up reduction with complication, c/s, gastric bypass, bil tubal ligation, long history of chronic low back/pelvic pain, pudendal neuralgia, coccydynia, Lt glute med repair  PRECAUTIONS: NA  SUBJECTIVE:                                                                                                                                                                                      SUBJECTIVE STATEMENT:  Pt states that  she is having some strange symptoms and a high white count. She was having chest pain/SOB, Rt lower quadrant pain, and  deep Rt hip pain. So she has been lying on her stomach more which is giving her more abdominal pain. She describes the pain as nerve pain   PAIN:  Are you having pain? Yes, 9/10, Bil hip/low back   03/03/22 SUBJECTIVE STATEMENT: Pt had surgery for Lt glute med tear that most likely happened during fall 3 years ago. She has still had pain in Lt hip, but she is in rehab due to it. She had meningitis in June and ended up having Bil frozen shoulders. Her biggest complaint is of back pain, which impacts her bil shoulder pain because she has to lay on either side. She mostly lies on Rt side, which causes pain in Rt low back and Rt hip. She is rarely getting pressure off of buttocks. She continues to have sacral/coccydynia with sitting and avoids this position. She is not currently having vaginal/pelvic pain, but states this is coming and going - she manages by decreasing frequency of intercourse - no 1x/week. She is having issue with Lt kidney, but MD states that urinary issues are not related.  Is currently in PT for hip and shoulders - primary working on shoulders. Is only doing some basic leg strengthening and shoulder exercises at home.  Fluid intake: Yes: -     PAIN:  Are you having pain? Yes NPRS scale: 8/10Right and Left Pain location:    Pain type: sharp and pressure Pain description: constant    Aggravating factors: sitting, driving, finding comfortable position Relieving factors: lying on stomach relieves buttock pain if she can, but shoulders prevent this   PRECAUTIONS: None   WEIGHT BEARING RESTRICTIONS: No   FALLS:  Has patient fallen in last 6 months? No   LIVING ENVIRONMENT: Lives with: lives with their family Lives in: House/apartment   OCCUPATION: disabled   PLOF: Independent   PATIENT GOALS: would like to be able to sit; decrease bil hip/low back pain   PERTINENT HISTORY:  Breast augmentation surgeries with follow-up reduction with complication, c/s, gastric bypass,  bil tubal ligation, long history of chronic low back/pelvic pain, pudendal neuralgia, coccydynia, Lt glute med repair Sexual abuse: No   BOWEL MOVEMENT: Pain with bowel movement: No Type of bowel movement:Frequency 1x/day and Strain Yes Fully empty rectum: Yes: - Leakage: No Pads: No Fiber supplement: No no medication to help stimulate bowel movements   URINATION: Pain with urination: No Fully empty bladder: No Stream:  trouble starting the stream Urgency: no Frequency: every 30 minutes to an hour - worse once she does go, then she has to keep going - easier to just hold it Leakage:  none Pads: No   INTERCOURSE: Pain with intercourse: Initial Penetration and During Penetration Ability to have vaginal penetration:  Yes: pain - only tolerates 1x/week Climax: yes Marinoff Scale: 2/3   PREGNANCY: Vaginal deliveries 0 Tearing No C-section deliveries 2 Currently pregnant No       OBJECTIVE:  06/23/22: Significant restriction in Rt hip with trigger points palpated Rt posterior/Lt anterior innominate rotation possible in pelvic  03/03/22: DIAGNOSTIC FINDINGS:  Lasix renogram with no abnormal findings   PATIENT SURVEYS:    PFIQ-7 100%   COGNITION: Overall cognitive status: Within functional limits for tasks assessed  SENSATION: Light touch: Appears intact Proprioception: Appears intact   MUSCLE LENGTH:     FUNCTIONAL TESTS:              5xSTS: 29 seconds   GAIT:   Comments: decreased bil hip extension, posterior pelvic tilt   POSTURE: rounded shoulders, forward head, increased lumbar lordosis, and posterior pelvic tilt   PELVIC ALIGNMENT:   LUMBARAROM/PROM:   A/PROM A/PROM  Eval (%)  Flexion 50, pain on Rt  Extension 20, no pain, very limited mobility  Right lateral flexion 50, pain in Rt low back  Left lateral flexion 75, pain in Rt low back  Right rotation 50, good stretch  Left rotation 50, good stretch   (Blank rows = not  tested)     LOWER EXTREMITY MMT: tested in standing due to comfort             Rt: 3/5 hip abduction and extension, 4/5 adduction and flexion             Lt: 3/5 hip abduction, extension, flexion, 4/5 adduction     PALPATION:   General  tenderness throughout bil lumbar paraspinals and gluts, Lt>Rt; tenderness and spasm in thoracic paraspinals with decreased mobility upon deep breathing; scar tissue restriction under bil breasts that is tender; scar tissue restriction Lt glut med surgical incision                 External Perineal Exam NA                             Internal Pelvic Floor NA   Patient confirms identification and approves PT to assess internal pelvic floor and treatment Yes for future need   PELVIC MMT:   MMT eval  Vaginal    Internal Anal Sphincter    External Anal Sphincter    Puborectalis    Diastasis Recti    (Blank rows = not tested)         TONE: NA   PROLAPSE: NA   TREATMENT 08/04/22 Manual: Soft tissue mobilization to low back/hips Myofascial release of low back and bil hips Neuromuscular re-education: Glute sets Abd sets Exercises: Piriformis stretch in various positions    TREATMENT 07/21/22 Manual: Soft tissue mobilization to low back/hips Myofascial release of low back and bil hips Neuromuscular re-education: Transversus abdominus training with multimodal cues for improved motor control and breath coordination Standing bil UE ball press isometric core activation with breath 10x Standing side UE ball press into wall with isometric core activation and breath 10x bil    TREATMENT 07/08/22 Manual: Soft tissue mobilization to low back/hips Myofascial release of low back and bil hips Therapeutic activities: Squat training in parallel bars in order to achieve better descent to toilet   PATIENT EDUCATION:  Education details: see above self-care Person educated: Patient Education method: Explanation, Demonstration, Tactile cues, Verbal  cues, and Handouts Education comprehension: verbalized understanding   HOME EXERCISE PROGRAM: Written handout   ASSESSMENT:   CLINICAL IMPRESSION: Pt having new myriad of symptoms that are being worked up by MD. No DN performed with elevated white blood cell count today. Discussed benefit of gentle facilitation exercises to help stay strong as much as she can, increase blood flow, and decrease generalized soreness. She tolerated all treatment well with reduced pain levels. She will continue to benefit from skilled PT intervention in order to decrease pain, improve functional ability, increase ease of starting stream/emptying bladder,  and improve QOL.    OBJECTIVE IMPAIRMENTS: decreased activity tolerance, decreased coordination, decreased endurance, decreased mobility, decreased ROM, decreased strength, hypomobility, increased fascial restrictions, increased muscle spasms, impaired flexibility, impaired tone, postural dysfunction, and pain.    ACTIVITY LIMITATIONS: bending, sitting, standing, stairs, transfers, bed mobility, and locomotion level   PARTICIPATION LIMITATIONS: cleaning, laundry, interpersonal relationship, driving, and community activity   PERSONAL FACTORS: 3+ comorbidities: Breast augmentation surgeries with follow-up reduction with complication, c/s, gastric bypass, bil tubal ligation, long history of chronic low back/pelvic pain, pudendal neuralgia, coccydynia, Lt glut med repair  are also affecting patient's functional outcome.    REHAB POTENTIAL: Good   CLINICAL DECISION MAKING: Stable/uncomplicated   EVALUATION COMPLEXITY: Low     GOALS: Goals reviewed with patient? Yes   SHORT TERM GOALS: Target date: 03/31/2022 - updated 03/26/22 updated 05/14/22 - updated 06/16/22 - updated 2/26 new target 09/01/2022   Pt will be independent with HEP.    Baseline: Goal status: MET 03/26/22   2.  Pt will be independent with diaphragmatic breathing and down training activities in  order to improve pelvic floor relaxation and abdominal wall mobility.   Baseline:  Goal status: MET 05/14/22   3.  Pt will report improve stream initiation and complete voiding with urination in order to improve QOL. Baseline:  Goal status: IN PROGRESS              4.  Pt will increase all impaired lumbar A/ROM by 25% without pain.   Baseline:  Goal status: IN PROGRESS   LONG TERM GOALS: Target date: 04/28/2022 - updated 03/26/22 - updated 05/14/22 - updated 06/16/22 - updated 06/23/22 new target 09/01/2022   Pt will be independent with advanced HEP.    Baseline:  Goal status:IN PROGRESS   2.  Pt will be able to use regular toilet without difficulty. Baseline: Pt has returned to using toilet chair 100% of the time due to increase in low back pain Goal status: IN PROGRESS   3.  Pt will be able to ascend/descend 12 steps at regular intervals throughout the day safely and with minimal difficulty.  Baseline: Pt has not been working on steps due to increase in low back pain Goal status: IN PROGRESS   4.  Pt will improve 5xSTS to less than 20 seconds and improved PFIQ by 25% in order to demonstrate improved QOL and functional ability.  Baseline: 28 seconds Goal status: IN PROGRESS   5.  Patient will report no greater than 5/10 pain with sitting in order to more easily attend appointments, drive, and spend time with family.  Baseline:  Goal status:IN PROGRESS   6.  Pt will demonstrate increase in all impaired hip strength by 1 muscle grades in order to demonstrate improved lumbopelvic support and increase functional ability.      Baseline:  Goal status: IN PROGRESS   PLAN:   PT FREQUENCY: 1x/week   PT DURATION: 8 weeks   PLANNED INTERVENTIONS: Therapeutic exercises, Therapeutic activity, Neuromuscular re-education, Balance training, Gait training, Patient/Family education, Self Care, Joint mobilization, Aquatic Therapy, Dry Needling, Biofeedback, and Manual therapy   PLAN FOR NEXT  SESSION: continue manual techniques as needed; progress mobility exercises.    Julio Alm, PT, DPT04/08/248:46 AM

## 2022-08-05 DIAGNOSIS — F331 Major depressive disorder, recurrent, moderate: Secondary | ICD-10-CM | POA: Diagnosis not present

## 2022-08-05 DIAGNOSIS — F5001 Anorexia nervosa, restricting type: Secondary | ICD-10-CM | POA: Diagnosis not present

## 2022-08-05 DIAGNOSIS — F410 Panic disorder [episodic paroxysmal anxiety] without agoraphobia: Secondary | ICD-10-CM | POA: Diagnosis not present

## 2022-08-06 DIAGNOSIS — F900 Attention-deficit hyperactivity disorder, predominantly inattentive type: Secondary | ICD-10-CM | POA: Diagnosis not present

## 2022-08-06 DIAGNOSIS — F331 Major depressive disorder, recurrent, moderate: Secondary | ICD-10-CM | POA: Diagnosis not present

## 2022-08-06 DIAGNOSIS — F411 Generalized anxiety disorder: Secondary | ICD-10-CM | POA: Diagnosis not present

## 2022-08-07 DIAGNOSIS — G894 Chronic pain syndrome: Secondary | ICD-10-CM | POA: Diagnosis not present

## 2022-08-07 DIAGNOSIS — M47816 Spondylosis without myelopathy or radiculopathy, lumbar region: Secondary | ICD-10-CM | POA: Diagnosis not present

## 2022-08-07 DIAGNOSIS — Z1330 Encounter for screening examination for mental health and behavioral disorders, unspecified: Secondary | ICD-10-CM | POA: Diagnosis not present

## 2022-08-07 DIAGNOSIS — M7061 Trochanteric bursitis, right hip: Secondary | ICD-10-CM | POA: Diagnosis not present

## 2022-08-07 DIAGNOSIS — Z79891 Long term (current) use of opiate analgesic: Secondary | ICD-10-CM | POA: Diagnosis not present

## 2022-08-07 LAB — JAK2 V617F RFX CALR/MPL/E12-15

## 2022-08-07 LAB — CALR +MPL + E12-E15  (REFLEX)

## 2022-08-08 ENCOUNTER — Ambulatory Visit (INDEPENDENT_AMBULATORY_CARE_PROVIDER_SITE_OTHER): Payer: PPO | Admitting: Orthopaedic Surgery

## 2022-08-08 DIAGNOSIS — M7500 Adhesive capsulitis of unspecified shoulder: Secondary | ICD-10-CM

## 2022-08-08 DIAGNOSIS — M25552 Pain in left hip: Secondary | ICD-10-CM
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
## 2022-08-11 ENCOUNTER — Ambulatory Visit: Payer: PPO

## 2022-08-15 ENCOUNTER — Ambulatory Visit (HOSPITAL_COMMUNITY)
Admission: RE | Admit: 2022-08-15 | Discharge: 2022-08-15 | Disposition: A | Discharge status: Home / Self Care | Payer: PPO | Source: Ambulatory Visit | Attending: Family Medicine | Admitting: Family Medicine

## 2022-08-15 DIAGNOSIS — R1031 Right lower quadrant pain: Secondary | ICD-10-CM | POA: Diagnosis not present

## 2022-08-15 DIAGNOSIS — D72829 Elevated white blood cell count, unspecified: Secondary | ICD-10-CM | POA: Diagnosis not present

## 2022-08-15 DIAGNOSIS — R1012 Left upper quadrant pain: Secondary | ICD-10-CM | POA: Diagnosis not present

## 2022-08-15 MED ORDER — IOHEXOL 300 MG/ML  SOLN
100.0000 mL | Freq: Once | INTRAMUSCULAR | Status: AC | PRN
  Administered 2022-08-15: 100 mL via INTRAVENOUS

## 2022-08-18 DIAGNOSIS — F900 Attention-deficit hyperactivity disorder, predominantly inattentive type: Secondary | ICD-10-CM | POA: Diagnosis not present

## 2022-08-18 DIAGNOSIS — F331 Major depressive disorder, recurrent, moderate: Secondary | ICD-10-CM | POA: Diagnosis not present

## 2022-08-18 DIAGNOSIS — F411 Generalized anxiety disorder: Secondary | ICD-10-CM | POA: Diagnosis not present

## 2022-08-19 ENCOUNTER — Telehealth: Payer: PPO | Admitting: Physician Assistant

## 2022-08-19 ENCOUNTER — Encounter: Payer: Self-pay | Admitting: Family Medicine

## 2022-08-19 DIAGNOSIS — R7989 Other specified abnormal findings of blood chemistry: Secondary | ICD-10-CM

## 2022-08-19 DIAGNOSIS — R1084 Generalized abdominal pain: Secondary | ICD-10-CM

## 2022-08-19 NOTE — Progress Notes (Unsigned)
VIRTUAL VISIT via TELEPHONE NOTE [facility]   I connected with Katelyn [name]  on 08/20/22 at  9:08 AM by telephone and verified that I am speaking with the correct person using two identifiers.  Location: Patient: Home Provider: [facility]   I discussed the limitations, risks, security and privacy concerns of performing an evaluation and management service by telephone and the availability of in person appointments. I also discussed with the patient that there may be a patient responsible charge related to this service. The patient expressed understanding and agreed to proceed.   INTERVAL HISTORY:  Katelyn [name] is contacted today to discuss results of leukocytosis workup that was initiated after her office visit with Rojelio Brenner PA-C on 07/30/2022.  At her last visit, she was noted to have increasing leukocytosis and reported symptoms of general malaise, fatigue, dyspnea on exertion, hair loss, abdominal pain, nausea/vomiting, early satiety, unintentional weight loss, low-grade fevers (100 F), and chest pain.  These symptoms are being worked up by her primary care provider.  At today's visit, she reports feeling about the same.  She has 25% energy and 50% appetite.   REVIEW OF SYSTEMS:   Review of Systems  Constitutional:  Positive for fever (100 F), malaise/fatigue and weight loss. Negative for chills and diaphoresis.  Respiratory:  Negative for cough and shortness of breath.   Cardiovascular:  Positive for chest pain. Negative for palpitations.  Gastrointestinal:  Positive for abdominal pain, diarrhea, nausea and vomiting. Negative for blood in stool and melena.  Neurological:  Positive for dizziness, tingling and headaches.  Psychiatric/Behavioral:  The patient has insomnia.      PHYSICAL EXAM: (per limitations of virtual telephone visit)  The patient is alert and oriented x 3, exhibiting adequate mentation, good mood, and ability  to speak in full sentences and execute sound judgement.  ASSESSMENT & PLAN:  1.  Intermittent leukocytosis - She has had intermittent leukocytosis since at least 2013, neutrophil predominant. - Most recent CBC/D (07/25/2022): With WBC 18.6/ANC 5.1. - Clinically seems to have some underlying pro-inflammatory state, although previous autoimmune workup at [facility] was negative. - Leukocytosis has been persistent since she had meningitis in June 2023. - She denies any active steroids (reports Symbicort is old prescription, and has not yet used desonide cream) - Non-smoker. - No recent infections. - No masses or lymphadenopathy. - Denies shaking chills, fevers, night sweats. - Reports 8 pound weight loss in the past month attributed to early satiety and LUQ discomfort. - Reports facial flushing and diaphoresis that occurs intermittently, usually when standing. - Labs from 07/30/2022 show improved but persistent leukocytosis, with WBC 15.8.  Patient also had mild erythrocytosis with hemoglobin 15.2 and thrombocytosis with platelets 459. BCR-ABL FISH negative JAK2 with reflex to CALR, MPL, Exons 12-15 negative. Elevated ESR 40, elevated CRP 3.1 Negative ANA, normal rheumatoid factor. - PLAN: MPN workup appears negative.  Suspect reactive leukocytosis, thrombocytosis, and erythrocytosis from underlying pro-inflammatory state. - However, due to increases in all three major cell lines, we will continue to watch closely and consider bone marrow biopsy if any major deviations from baseline. - CBC/differential monthly x 3 - Labs in 4 months = CBC/D, CMP, LDH, ESR, CRP - OFFICE visit in 4 months  2.  Unintentional weight loss & other symptoms - Reported symptoms of general malaise, fatigue, dyspnea on exertion, hair loss, abdominal pain, nausea/vomiting, early satiety, unintentional weight loss, low-grade fevers (100 F), and chest pain. -  These symptoms are being worked up by her primary care provider. CTA  chest (08/01/2022) negative for PE.  Showed mild respiratory motion, low lung volumes, and atelectasis.  Moderate atrophy of left renal cortex.  Progressed since 2022. CT abdomen/pelvis (08/15/2022): 4.6 cm left-sided uterine fibroid.  Benign-appearing left ovarian cyst measuring 2.4 cm. - Labs from 07/30/2022 showed normal TSH and normal T4 - PLAN: Continue workup per primary care provider.  3.  OTHER ISSUES (not addressed at this visit - see full note from 07/30/2022) - Iron deficiency state: Check CBC, ferritin, iron/TIBC in 4 months - Macrocytosis - Elevated B12: Check B12, folate, MMA, homocystine in 6 to 12 months   PLAN SUMMARY: >> Monthly CBC/differential >> Labs in 4 months = CBC/D, CMP, LDH, ESR, CRP, ferritin, iron/TIBC >> OFFICE visit in 4 months     I discussed the assessment and treatment plan with the patient. The patient was provided an opportunity to ask questions and all were answered. The patient agreed with the plan and demonstrated an understanding of the instructions.   The patient was advised to call back or seek an in-person evaluation if the symptoms worsen or if the condition fails to improve as anticipated.  I provided 22 minutes of non-face-to-face time during this encounter.  Carnella Guadalajara, PA-C 08/20/22 9:30 AM

## 2022-08-19 NOTE — Telephone Encounter (Signed)
Nurses I appreciated the message from Makenley  Please make sure a copy of her CAT scan is forwarded to her gynecologist  I would recommend that Sarahlynn do a follow-up visit with her gynecologist although the findings on the CAT scan more than likely are benign it would still would be wise to do a follow-up visit  As for GI please go ahead with referral to gastroenterology-Dr. Lucita Lora of persistent abdominal pain (CAT scan did not reveal any findings that would account for the abdominal discomfort)  Nurses-go ahead and order venous ultrasound of the legs to rule out the possibility of blood clots in the legs because she did have an elevated D-dimer her CT angio of the chest earlier this month was negative  Thanks-Dr. Lilyan Punt

## 2022-08-20 ENCOUNTER — Inpatient Hospital Stay (HOSPITAL_BASED_OUTPATIENT_CLINIC_OR_DEPARTMENT_OTHER): Payer: PPO | Admitting: Physician Assistant

## 2022-08-20 ENCOUNTER — Other Ambulatory Visit: Payer: Self-pay

## 2022-08-20 DIAGNOSIS — D7589 Other specified diseases of blood and blood-forming organs: Secondary | ICD-10-CM

## 2022-08-20 DIAGNOSIS — D72829 Elevated white blood cell count, unspecified: Secondary | ICD-10-CM

## 2022-08-21 ENCOUNTER — Ambulatory Visit: Payer: PPO

## 2022-08-21 DIAGNOSIS — R293 Abnormal posture: Secondary | ICD-10-CM | POA: Diagnosis not present

## 2022-08-21 DIAGNOSIS — M6281 Muscle weakness (generalized): Secondary | ICD-10-CM

## 2022-08-21 DIAGNOSIS — M25552 Pain in left hip: Secondary | ICD-10-CM

## 2022-08-21 DIAGNOSIS — R279 Unspecified lack of coordination: Secondary | ICD-10-CM

## 2022-08-21 DIAGNOSIS — M62838 Other muscle spasm: Secondary | ICD-10-CM

## 2022-08-21 NOTE — Therapy (Signed)
OUTPATIENT PHYSICAL THERAPY TREATMENT NOTE  See note below for Objective Data and Assessment of Progress/Goals.    Patient Name: Katelyn [name] MRN: [id number] DOB:02-12-1976, 47 y.o., female Today's Date: 08/21/2022  PCP: Babs Sciara, MD  REFERRING PROVIDER: Despina Arias, MD  END OF SESSION:   PT End of Session - 08/21/22 0843     Visit Number [id number]    Date for PT Re-Evaluation 09/01/22    Authorization Time Period 6    Authorization - Visit Number 10    PT Start Time 0845    PT Stop Time 0925    PT Time Calculation (min) 40 min    Activity Tolerance Patient tolerated treatment well    Behavior During Therapy [facility] for tasks assessed/performed                 Past Medical History:  Diagnosis Date   Anemia    Anorexia    Anxiety    Asthma    controlled with meds   Benign juvenile melanoma    Chronic headaches    Colon polyps    found on colonoscopy 04/26/2012   Complication of anesthesia    itching after epidural for c section   Constipation    Depression    Depression    several suicide attempts, hospitaluzed in 2012 for this; hx pf ECT treatments ; pt sees Dr. Lolly Mustache pyschiatrist  and doing well on medication   Dysrhythmia    hx of PVC's (pt hasn't seen cardiologist since 2021- no follow up needed)   GERD (gastroesophageal reflux disease)    Headache    Heart murmur    as a child - no one mentions hearing murmur anymore, sometimes it is heard. Echo has been performed 02/03/18   Heartburn    no meds   History of pneumonia    x 3  years ago - no recent problems   Hx of blood clots    Left renal atrophy 12/19/2021   Neuromuscular disorder    small fiber neuropathy   Obesity    Pneumonia    Polycystic ovary    takes metformin to treat   Renal atrophy, left 2023   Skin lesion    Excisional biopsy of moles - none cancerous   Sleep apnea    yrs ago - diagnosed mild sleep apnea - did not have to use cpap    Past Surgical History:  Procedure  Laterality Date   BREAST CAPSULECTOMY Bilateral 12/13/2020   1 week later pt had hematoma surgery on left breast   BREAST ENHANCEMENT SURGERY Bilateral 02/11/2017   BREATH TEK H PYLORI N/A 07/22/2013   Procedure: BREATH TEK H PYLORI;  Surgeon: Mariella Saa, MD;  Location: [facility];  Service: General;  Laterality: N/A;   CESAREAN SECTION  2004, 2007   x 2   COLONOSCOPY  2013   CYSTOSCOPY WITH URETHRAL DILATATION  age 68    GASTRIC ROUX-EN-Y N/A 10/31/2013   Procedure: LAPAROSCOPIC ROUX-EN-Y GASTRIC BYPASS WITH UPPER ENDOSCOPY;  Surgeon: Mariella Saa, MD;  Location: [facility];  Service: General;  Laterality: N/A;   GLUTEUS MINIMUS REPAIR Left 05/28/2021   Procedure: Left GLUTEUS Medius REPAIR;  Surgeon: Huel Cote, MD;  Location: [facility];  Service: Orthopedics;  Laterality: Left;   ingrown toenail Right    Ingrown nail on great right toe   kidney stent  08/28/2014   mole excision     "benign juvenile melanoma" removed from left  leg - inner thigh   POLYPECTOMY     2013   SKIN LESION EXCISION     back   TONSILLECTOMY  age 39   TUBAL LIGATION  2018   Patient Active Problem List   Diagnosis Date Noted   Left renal atrophy 12/19/2021   Stage 2 chronic kidney disease 11/20/2021   Meningitis 09/27/2021   Epigastric pain 09/27/2021   GERD (gastroesophageal reflux disease) 09/27/2021   Rash and nonspecific skin eruption 09/27/2021   Hypocalcemia 09/27/2021   Chronic pain syndrome 09/27/2021   Sepsis    Fever 09/25/2021   Tear of left gluteus medius tendon    Asthma 07/18/2020   Vitamin B 12 deficiency 10/16/2019   Elevated LFTs 10/16/2019   Asthma, exogenous, unspecified asthma severity, uncomplicated 01/04/2019   B12 deficiency anemia 06/21/2018   Venous insufficiency 04/23/2018   Onychomycosis of toenail 04/17/2018   Chronic myofascial pain 03/18/2018   Small fiber neuropathy 03/18/2018   Idiopathic small fiber sensory neuropathy 02/23/2018   Varicose veins of  both lower extremities 01/13/2018   Cervical spondylosis without myelopathy 08/28/2017   Migraine with aura and without status migrainosus, not intractable 07/22/2017   Lumbar degenerative disc disease 07/20/2017   Spondylosis of lumbar spine 07/20/2017   Coccydynia 03/12/2017   Bariatric surgery status 05/02/2016   Major depressive disorder, recurrent episode, moderate 02/18/2016   Tachy-brady syndrome 12/19/2015   Iron deficiency anemia 06/10/2015   Sleep disorder 06/01/2013   Generalized anxiety disorder 05/30/2013   Chronic headaches 07/01/2012   Headache 07/01/2012   Depression, major, recurrent, severe with psychosis 06/17/2012   Severe recurrent major depressive disorder with psychotic symptoms 06/17/2012   Family history of colonic polyps 03/03/2012   Eating disorder 12/18/2011   Nausea & vomiting 01/22/2010   Polycystic ovarian syndrome 09/01/2008   Intermittent left-sided chest pain 08/02/2008    REFERRING DIAG: M25.511,G89.29,M25.512 (ICD-10-CM) - Chronic pain of both shoulders S76.012A (ICD-10-CM) - Tear of left gluteus medius tendon, initial encounter  THERAPY DIAG:  Abnormal posture  Muscle weakness (generalized)  Other muscle spasm  Pain in left hip  Unspecified lack of coordination  Rationale for Evaluation and Treatment Rehabilitation  PERTINENT HISTORY: Breast augmentation surgeries with follow-up reduction with complication, c/s, gastric bypass, bil tubal ligation, long history of chronic low back/pelvic pain, pudendal neuralgia, coccydynia, Lt glute med repair  PRECAUTIONS: NA  SUBJECTIVE:                                                                                                                                                                                      SUBJECTIVE STATEMENT:  Pt has been diagnosed with Lt uterine fibroid  and ovarian cyst that she will be seeing gynecology for soon. Rt hip is feeling slightly better, but she has been lying  on Lt side more which has caused an increase in pain. She reports isometrics are helpful.    PAIN:  Are you having pain? Yes, 8/10, Bil hip/low back   03/03/22 SUBJECTIVE STATEMENT: Pt had surgery for Lt glute med tear that most likely happened during fall 3 years ago. She has still had pain in Lt hip, but she is in rehab due to it. She had meningitis in June and ended up having Bil frozen shoulders. Her biggest complaint is of back pain, which impacts her bil shoulder pain because she has to lay on either side. She mostly lies on Rt side, which causes pain in Rt low back and Rt hip. She is rarely getting pressure off of buttocks. She continues to have sacral/coccydynia with sitting and avoids this position. She is not currently having vaginal/pelvic pain, but states this is coming and going - she manages by decreasing frequency of intercourse - no 1x/week. She is having issue with Lt kidney, but MD states that urinary issues are not related.  Is currently in PT for hip and shoulders - primary working on shoulders. Is only doing some basic leg strengthening and shoulder exercises at home.  Fluid intake: Yes: -     PAIN:  Are you having pain? Yes NPRS scale: 8/10Right and Left Pain location:    Pain type: sharp and pressure Pain description: constant    Aggravating factors: sitting, driving, finding comfortable position Relieving factors: lying on stomach relieves buttock pain if she can, but shoulders prevent this   PRECAUTIONS: None   WEIGHT BEARING RESTRICTIONS: No   FALLS:  Has patient fallen in last 6 months? No   LIVING ENVIRONMENT: Lives with: lives with their family Lives in: House/apartment   OCCUPATION: disabled   PLOF: Independent   PATIENT GOALS: would like to be able to sit; decrease bil hip/low back pain   PERTINENT HISTORY:  Breast augmentation surgeries with follow-up reduction with complication, c/s, gastric bypass, bil tubal ligation, long history of chronic  low back/pelvic pain, pudendal neuralgia, coccydynia, Lt glute med repair Sexual abuse: No   BOWEL MOVEMENT: Pain with bowel movement: No Type of bowel movement:Frequency 1x/day and Strain Yes Fully empty rectum: Yes: - Leakage: No Pads: No Fiber supplement: No no medication to help stimulate bowel movements   URINATION: Pain with urination: No Fully empty bladder: No Stream:  trouble starting the stream Urgency: no Frequency: every 30 minutes to an hour - worse once she does go, then she has to keep going - easier to just hold it Leakage:  none Pads: No   INTERCOURSE: Pain with intercourse: Initial Penetration and During Penetration Ability to have vaginal penetration:  Yes: pain - only tolerates 1x/week Climax: yes Marinoff Scale: 2/3   PREGNANCY: Vaginal deliveries 0 Tearing No C-section deliveries 2 Currently pregnant No       OBJECTIVE:  06/23/22: Significant restriction in Rt hip with trigger points palpated Rt posterior/Lt anterior innominate rotation possible in pelvic  03/03/22: DIAGNOSTIC FINDINGS:  Lasix renogram with no abnormal findings   PATIENT SURVEYS:    PFIQ-7 100%   COGNITION: Overall cognitive status: Within functional limits for tasks assessed                          SENSATION: Light touch: Appears intact Proprioception: Appears intact  MUSCLE LENGTH:     FUNCTIONAL TESTS:              5xSTS: 29 seconds   GAIT:   Comments: decreased bil hip extension, posterior pelvic tilt   POSTURE: rounded shoulders, forward head, increased lumbar lordosis, and posterior pelvic tilt   PELVIC ALIGNMENT:   LUMBARAROM/PROM:   A/PROM A/PROM  Eval (%)  Flexion 50, pain on Rt  Extension 20, no pain, very limited mobility  Right lateral flexion 50, pain in Rt low back  Left lateral flexion 75, pain in Rt low back  Right rotation 50, good stretch  Left rotation 50, good stretch   (Blank rows = not tested)     LOWER EXTREMITY MMT: tested  in standing due to comfort             Rt: 3/5 hip abduction and extension, 4/5 adduction and flexion             Lt: 3/5 hip abduction, extension, flexion, 4/5 adduction     PALPATION:   General  tenderness throughout bil lumbar paraspinals and gluts, Lt>Rt; tenderness and spasm in thoracic paraspinals with decreased mobility upon deep breathing; scar tissue restriction under bil breasts that is tender; scar tissue restriction Lt glut med surgical incision                 External Perineal Exam NA                             Internal Pelvic Floor NA   Patient confirms identification and approves PT to assess internal pelvic floor and treatment Yes for future need   PELVIC MMT:   MMT eval  Vaginal    Internal Anal Sphincter    External Anal Sphincter    Puborectalis    Diastasis Recti    (Blank rows = not tested)         TONE: NA   PROLAPSE: NA   TREATMENT 08/21/22 Manual: Trigger Point Dry-Needling  Treatment instructions: Expect mild to moderate muscle soreness. S/S of pneumothorax if dry needled over a lung field, and to seek immediate medical attention should they occur. Patient verbalized understanding of these instructions and education.  Patient Consent Given: Yes Education handout provided: Previously provided Muscles treated: Lt glutes Electrical stimulation performed: No Parameters: N/A Treatment response/outcome: Twitch response and release Soft tissue mobilization to low back/hips Myofascial release of low back and bil hips   TREATMENT 08/04/22 Manual: Soft tissue mobilization to low back/hips Myofascial release of low back and bil hips Neuromuscular re-education: Glute sets Abd sets Exercises: Piriformis stretch in various positions    TREATMENT 07/21/22 Manual: Soft tissue mobilization to low back/hips Myofascial release of low back and bil hips Neuromuscular re-education: Transversus abdominus training with multimodal cues for improved motor  control and breath coordination Standing bil UE ball press isometric core activation with breath 10x Standing side UE ball press into wall with isometric core activation and breath 10x bil    TREATMENT 07/08/22 Manual: Soft tissue mobilization to low back/hips Myofascial release of low back and bil hips Therapeutic activities: Squat training in parallel bars in order to achieve better descent to toilet   PATIENT EDUCATION:  Education details: see above self-care Person educated: Patient Education method: Explanation, Demonstration, Tactile cues, Verbal cues, and Handouts Education comprehension: verbalized understanding   HOME EXERCISE PROGRAM: Written handout   ASSESSMENT:   CLINICAL IMPRESSION: Pt doing better  with Rt hip pain but has aggravated LT hip by side lying on it more. DN performed to LT hip with painful tolerance, but good release of restriction. She did very well with treatment session reporting 5/10 pain at end of session, which she states is very good for her. She will continue to benefit from skilled PT intervention in order to decrease pain, improve functional ability, increase ease of starting stream/emptying bladder, and improve QOL.    OBJECTIVE IMPAIRMENTS: decreased activity tolerance, decreased coordination, decreased endurance, decreased mobility, decreased ROM, decreased strength, hypomobility, increased fascial restrictions, increased muscle spasms, impaired flexibility, impaired tone, postural dysfunction, and pain.    ACTIVITY LIMITATIONS: bending, sitting, standing, stairs, transfers, bed mobility, and locomotion level   PARTICIPATION LIMITATIONS: cleaning, laundry, interpersonal relationship, driving, and community activity   PERSONAL FACTORS: 3+ comorbidities: Breast augmentation surgeries with follow-up reduction with complication, c/s, gastric bypass, bil tubal ligation, long history of chronic low back/pelvic pain, pudendal neuralgia, coccydynia, Lt  glut med repair  are also affecting patient's functional outcome.    REHAB POTENTIAL: Good   CLINICAL DECISION MAKING: Stable/uncomplicated   EVALUATION COMPLEXITY: Low     GOALS: Goals reviewed with patient? Yes   SHORT TERM GOALS: Target date: 03/31/2022 - updated 03/26/22 updated 05/14/22 - updated 06/16/22 - updated 2/26 new target 09/01/2022 - updated 08/21/22   Pt will be independent with HEP.    Baseline: Goal status: MET 03/26/22   2.  Pt will be independent with diaphragmatic breathing and down training activities in order to improve pelvic floor relaxation and abdominal wall mobility.   Baseline:  Goal status: MET 05/14/22   3.  Pt will report improve stream initiation and complete voiding with urination in order to improve QOL. Baseline:  Goal status: IN PROGRESS              4.  Pt will increase all impaired lumbar A/ROM by 25% without pain.   Baseline:  Goal status: IN PROGRESS   LONG TERM GOALS: Target date: 04/28/2022 - updated 03/26/22 - updated 05/14/22 - updated 06/16/22 - updated 06/23/22 new target 09/01/2022 - updated 08/21/22   Pt will be independent with advanced HEP.    Baseline:  Goal status:IN PROGRESS   2.  Pt will be able to use regular toilet without difficulty. Baseline: Pt has returned to using toilet chair 100% of the time due to increase in low back pain Goal status: IN PROGRESS   3.  Pt will be able to ascend/descend 12 steps at regular intervals throughout the day safely and with minimal difficulty.  Baseline: Pt has not been working on steps due to increase in low back pain Goal status: IN PROGRESS   4.  Pt will improve 5xSTS to less than 20 seconds and improved PFIQ by 25% in order to demonstrate improved QOL and functional ability.  Baseline: 28 seconds Goal status: IN PROGRESS   5.  Patient will report no greater than 5/10 pain with sitting in order to more easily attend appointments, drive, and spend time with family.  Baseline:  Goal  status:IN PROGRESS   6.  Pt will demonstrate increase in all impaired hip strength by 1 muscle grades in order to demonstrate improved lumbopelvic support and increase functional ability.      Baseline:  Goal status: IN PROGRESS   PLAN:   PT FREQUENCY: 1x/week   PT DURATION: 8 weeks   PLANNED INTERVENTIONS: Therapeutic exercises, Therapeutic activity, Neuromuscular re-education, Balance training, Gait training, Patient/Family  education, Self Care, Joint mobilization, Aquatic Therapy, Dry Needling, Biofeedback, and Manual therapy   PLAN FOR NEXT SESSION: Progress length of isometric holds to tolerance; continue manual techniques as needed.    Julio Alm, PT, DPT04/25/248:43 AM

## 2022-08-27 ENCOUNTER — Encounter: Payer: Self-pay | Admitting: Family Medicine

## 2022-08-27 ENCOUNTER — Ambulatory Visit (HOSPITAL_COMMUNITY)
Admission: RE | Admit: 2022-08-27 | Discharge: 2022-08-27 | Disposition: A | Discharge status: Home / Self Care | Payer: PPO | Source: Ambulatory Visit | Attending: Family Medicine | Admitting: Family Medicine

## 2022-08-27 DIAGNOSIS — R7989 Other specified abnormal findings of blood chemistry: Secondary | ICD-10-CM | POA: Diagnosis not present

## 2022-08-27 DIAGNOSIS — Z86711 Personal history of pulmonary embolism: Secondary | ICD-10-CM

## 2022-08-27 NOTE — Progress Notes (Signed)
Bilateral lower extremity venous duplex has been completed. Preliminary results can be found in CV Proc through chart review.  Results were faxed to Dr. Gerda Diss.  08/27/22 10:48 AM Olen Cordial RVT

## 2022-09-01 DIAGNOSIS — F900 Attention-deficit hyperactivity disorder, predominantly inattentive type: Secondary | ICD-10-CM | POA: Diagnosis not present

## 2022-09-01 DIAGNOSIS — F411 Generalized anxiety disorder: Secondary | ICD-10-CM | POA: Diagnosis not present

## 2022-09-01 DIAGNOSIS — F331 Major depressive disorder, recurrent, moderate: Secondary | ICD-10-CM | POA: Diagnosis not present

## 2022-09-02 ENCOUNTER — Ambulatory Visit: Payer: PPO | Attending: Urology

## 2022-09-02 DIAGNOSIS — M6281 Muscle weakness (generalized): Secondary | ICD-10-CM | POA: Diagnosis not present

## 2022-09-02 DIAGNOSIS — M62838 Other muscle spasm: Secondary | ICD-10-CM | POA: Diagnosis not present

## 2022-09-02 DIAGNOSIS — R279 Unspecified lack of coordination: Secondary | ICD-10-CM | POA: Diagnosis not present

## 2022-09-02 DIAGNOSIS — G8929 Other chronic pain: Secondary | ICD-10-CM | POA: Diagnosis not present

## 2022-09-02 DIAGNOSIS — M25552 Pain in left hip: Secondary | ICD-10-CM | POA: Insufficient documentation

## 2022-09-02 DIAGNOSIS — R293 Abnormal posture: Secondary | ICD-10-CM

## 2022-09-02 DIAGNOSIS — M25512 Pain in left shoulder: Secondary | ICD-10-CM | POA: Insufficient documentation

## 2022-09-02 NOTE — Therapy (Signed)
OUTPATIENT PHYSICAL THERAPY TREATMENT NOTE  See note below for Objective Data and Assessment of Progress/Goals.    Patient Name: Katelyn [name] MRN: [id number] DOB:05-Dec-1975, 47 y.o., female Today's Date: 09/02/2022  PCP: Babs Sciara, MD  REFERRING PROVIDER: Despina Arias, MD  END OF SESSION:   PT End of Session - 09/02/22 0843     Visit Number 37    Date for PT Re-Evaluation 11/11/22    Authorization Type HTA    Authorization Time Period 1    Authorization - Visit Number 10    Progress Note Due on Visit 10    PT Start Time 0845    PT Stop Time 0925    PT Time Calculation (min) 40 min    Activity Tolerance Patient tolerated treatment well    Behavior During Therapy [facility] for tasks assessed/performed                  Past Medical History:  Diagnosis Date   Anemia    Anorexia    Anxiety    Asthma    controlled with meds   Benign juvenile melanoma    Chronic headaches    Colon polyps    found on colonoscopy 04/26/2012   Complication of anesthesia    itching after epidural for c section   Constipation    Depression    Depression    several suicide attempts, hospitaluzed in 2012 for this; hx pf ECT treatments ; pt sees Dr. Lolly Mustache pyschiatrist  and doing well on medication   Dysrhythmia    hx of PVC's (pt hasn't seen cardiologist since 2021- no follow up needed)   GERD (gastroesophageal reflux disease)    Headache    Heart murmur    as a child - no one mentions hearing murmur anymore, sometimes it is heard. Echo has been performed 02/03/18   Heartburn    no meds   History of pneumonia    x 3  years ago - no recent problems   Hx of blood clots    Left renal atrophy 12/19/2021   Neuromuscular disorder (HCC)    small fiber neuropathy   Obesity    Pneumonia    Polycystic ovary    takes metformin to treat   Renal atrophy, left 2023   Skin lesion    Excisional biopsy of moles - none cancerous   Sleep apnea    yrs ago - diagnosed mild sleep apnea  - did not have to use cpap    Past Surgical History:  Procedure Laterality Date   BREAST CAPSULECTOMY Bilateral 12/13/2020   1 week later pt had hematoma surgery on left breast   BREAST ENHANCEMENT SURGERY Bilateral 02/11/2017   BREATH TEK H PYLORI N/A 07/22/2013   Procedure: BREATH TEK H PYLORI;  Surgeon: Mariella Saa, MD;  Location: [facility];  Service: General;  Laterality: N/A;   CESAREAN SECTION  2004, 2007   x 2   COLONOSCOPY  2013   CYSTOSCOPY WITH URETHRAL DILATATION  age 89    GASTRIC ROUX-EN-Y N/A 10/31/2013   Procedure: LAPAROSCOPIC ROUX-EN-Y GASTRIC BYPASS WITH UPPER ENDOSCOPY;  Surgeon: Mariella Saa, MD;  Location: [facility];  Service: General;  Laterality: N/A;   GLUTEUS MINIMUS REPAIR Left 05/28/2021   Procedure: Left GLUTEUS Medius REPAIR;  Surgeon: Huel Cote, MD;  Location: [facility];  Service: Orthopedics;  Laterality: Left;   ingrown toenail Right    Ingrown nail on great right toe   kidney  stent  08/28/2014   mole excision     "benign juvenile melanoma" removed from left leg - inner thigh   POLYPECTOMY     2013   SKIN LESION EXCISION     back   TONSILLECTOMY  age 77   TUBAL LIGATION  2018   Patient Active Problem List   Diagnosis Date Noted   Left renal atrophy 12/19/2021   Stage 2 chronic kidney disease 11/20/2021   Meningitis 09/27/2021   Epigastric pain 09/27/2021   GERD (gastroesophageal reflux disease) 09/27/2021   Rash and nonspecific skin eruption 09/27/2021   Hypocalcemia 09/27/2021   Chronic pain syndrome 09/27/2021   Sepsis (HCC)    Fever 09/25/2021   Tear of left gluteus medius tendon    Asthma 07/18/2020   Vitamin B 12 deficiency 10/16/2019   Elevated LFTs 10/16/2019   Asthma, exogenous, unspecified asthma severity, uncomplicated 01/04/2019   B12 deficiency anemia 06/21/2018   Venous insufficiency 04/23/2018   Onychomycosis of toenail 04/17/2018   Chronic myofascial pain 03/18/2018   Small fiber neuropathy 03/18/2018    Idiopathic small fiber sensory neuropathy 02/23/2018   Varicose veins of both lower extremities 01/13/2018   Cervical spondylosis without myelopathy 08/28/2017   Migraine with aura and without status migrainosus, not intractable 07/22/2017   Lumbar degenerative disc disease 07/20/2017   Spondylosis of lumbar spine 07/20/2017   Coccydynia 03/12/2017   Bariatric surgery status 05/02/2016   Major depressive disorder, recurrent episode, moderate (HCC) 02/18/2016   Tachy-brady syndrome (HCC) 12/19/2015   Iron deficiency anemia 06/10/2015   Sleep disorder 06/01/2013   Generalized anxiety disorder 05/30/2013   Chronic headaches 07/01/2012   Headache 07/01/2012   Depression, major, recurrent, severe with psychosis (HCC) 06/17/2012   Severe recurrent major depressive disorder with psychotic symptoms (HCC) 06/17/2012   Family history of colonic polyps 03/03/2012   Eating disorder 12/18/2011   Nausea & vomiting 01/22/2010   Polycystic ovarian syndrome 09/01/2008   Intermittent left-sided chest pain 08/02/2008    REFERRING DIAG: M25.511,G89.29,M25.512 (ICD-10-CM) - Chronic pain of both shoulders S76.012A (ICD-10-CM) - Tear of left gluteus medius tendon, initial encounter  THERAPY DIAG:  Abnormal posture  Muscle weakness (generalized)  Other muscle spasm  Pain in left hip  Unspecified lack of coordination  Rationale for Evaluation and Treatment Rehabilitation  PERTINENT HISTORY: Breast augmentation surgeries with follow-up reduction with complication, c/s, gastric bypass, bil tubal ligation, long history of chronic low back/pelvic pain, pudendal neuralgia, coccydynia, Lt glute med repair  PRECAUTIONS: NA  SUBJECTIVE:  SUBJECTIVE STATEMENT:  Pt has had [place] of Bil LE and was found not to have blood clots. She  is having more pain in Rt hip today.   Problem list: -pudendal neuroalgia/intermittent labial pain -Bil hip and low back pain -being unable to sit -deconditioning -urinary urgency   PAIN:  Are you having pain? Yes, 8/10, Bil hip/low back   03/03/22 SUBJECTIVE STATEMENT: Pt had surgery for Lt glute med tear that most likely happened during fall 3 years ago. She has still had pain in Lt hip, but she is in rehab due to it. She had meningitis in June and ended up having Bil frozen shoulders. Her biggest complaint is of back pain, which impacts her bil shoulder pain because she has to lay on either side. She mostly lies on Rt side, which causes pain in Rt low back and Rt hip. She is rarely getting pressure off of buttocks. She continues to have sacral/coccydynia with sitting and avoids this position. She is not currently having vaginal/pelvic pain, but states this is coming and going - she manages by decreasing frequency of intercourse - no 1x/week. She is having issue with Lt kidney, but MD states that urinary issues are not related.  Is currently in PT for hip and shoulders - primary working on shoulders. Is only doing some basic leg strengthening and shoulder exercises at home.  Fluid intake: Yes: -     PAIN:  Are you having pain? Yes NPRS scale: 8/10Right and Left Pain location:    Pain type: sharp and pressure Pain description: constant    Aggravating factors: sitting, driving, finding comfortable position Relieving factors: lying on stomach relieves buttock pain if she can, but shoulders prevent this   PRECAUTIONS: None   WEIGHT BEARING RESTRICTIONS: No   FALLS:  Has patient fallen in last 6 months? No   LIVING ENVIRONMENT: Lives with: lives with their family Lives in: House/apartment   OCCUPATION: disabled   PLOF: Independent   PATIENT GOALS: would like to be able to sit; decrease bil hip/low back pain   PERTINENT HISTORY:  Breast augmentation surgeries with follow-up  reduction with complication, c/s, gastric bypass, bil tubal ligation, long history of chronic low back/pelvic pain, pudendal neuralgia, coccydynia, Lt glute med repair Sexual abuse: No   BOWEL MOVEMENT: Pain with bowel movement: No Type of bowel movement:Frequency 1x/day and Strain Yes Fully empty rectum: Yes: - Leakage: No Pads: No Fiber supplement: No no medication to help stimulate bowel movements   URINATION: Pain with urination: No Fully empty bladder: No Stream:  trouble starting the stream Urgency: no Frequency: every 30 minutes to an hour - worse once she does go, then she has to keep going - easier to just hold it Leakage:  none Pads: No   INTERCOURSE: Pain with intercourse: Initial Penetration and During Penetration Ability to have vaginal penetration:  Yes: pain - only tolerates 1x/week Climax: yes Marinoff Scale: 2/3   PREGNANCY: Vaginal deliveries 0 Tearing No C-section deliveries 2 Currently pregnant No       OBJECTIVE:  09/02/22: Bil trigger points and myofascial restriction in bil glutes/TFL/lumbar paraspinals  Squat: initially anterior weight translation and bil heel lift; made good corrections with cues and getting weight more posterior  Lumbar A/ROM: extension 20%, flexion 20%, lateral flexion bil 50%  06/23/22: Significant restriction in Rt hip with trigger points palpated Rt posterior/Lt anterior innominate rotation possible in pelvic  03/03/22: DIAGNOSTIC FINDINGS:  Lasix renogram with no abnormal findings   PATIENT  SURVEYS:    PFIQ-7 100%   COGNITION: Overall cognitive status: Within functional limits for tasks assessed                          SENSATION: Light touch: Appears intact Proprioception: Appears intact   MUSCLE LENGTH:     FUNCTIONAL TESTS:              5xSTS: 29 seconds   GAIT:   Comments: decreased bil hip extension, posterior pelvic tilt   POSTURE: rounded shoulders, forward head, increased lumbar lordosis, and  posterior pelvic tilt   PELVIC ALIGNMENT:   LUMBARAROM/PROM:   A/PROM A/PROM  Eval (%)  Flexion 50, pain on Rt  Extension 20, no pain, very limited mobility  Right lateral flexion 50, pain in Rt low back  Left lateral flexion 75, pain in Rt low back  Right rotation 50, good stretch  Left rotation 50, good stretch   (Blank rows = not tested)     LOWER EXTREMITY MMT: tested in standing due to comfort             Rt: 3/5 hip abduction and extension, 4/5 adduction and flexion             Lt: 3/5 hip abduction, extension, flexion, 4/5 adduction     PALPATION:   General  tenderness throughout bil lumbar paraspinals and gluts, Lt>Rt; tenderness and spasm in thoracic paraspinals with decreased mobility upon deep breathing; scar tissue restriction under bil breasts that is tender; scar tissue restriction Lt glut med surgical incision                 External Perineal Exam NA                             Internal Pelvic Floor NA   Patient confirms identification and approves PT to assess internal pelvic floor and treatment Yes for future need   PELVIC MMT:   MMT eval  Vaginal    Internal Anal Sphincter    External Anal Sphincter    Puborectalis    Diastasis Recti    (Blank rows = not tested)         TONE: NA   PROLAPSE: NA   TREATMENT 09/02/22 RE-EVAL Manual: Soft tissue mobilization to low back/hips Myofascial release of low back and bil hips Therapeutic activities: Double voiding  Urge drill  Squat form/ergonomics Using shopping cart at grocery store    TREATMENT 08/21/22 Manual: Trigger Point Dry-Needling  Treatment instructions: Expect mild to moderate muscle soreness. S/S of pneumothorax if dry needled over a lung field, and to seek immediate medical attention should they occur. Patient verbalized understanding of these instructions and education.  Patient Consent Given: Yes Education handout provided: Previously provided Muscles treated: Lt  glutes Electrical stimulation performed: No Parameters: N/A Treatment response/outcome: Twitch response and release Soft tissue mobilization to low back/hips Myofascial release of low back and bil hips   TREATMENT 08/04/22 Manual: Soft tissue mobilization to low back/hips Myofascial release of low back and bil hips Neuromuscular re-education: Glute sets Abd sets Exercises: Piriformis stretch in various positions     PATIENT EDUCATION:  Education details: see above self-care Person educated: Patient Education method: Explanation, Demonstration, Tactile cues, Verbal cues, and Handouts Education comprehension: verbalized understanding   HOME EXERCISE PROGRAM: Written handout   ASSESSMENT:   CLINICAL IMPRESSION: Pt has seen varying progress over the  last 7 visits. She was making progress with not using raised toilet seat but had to return to this full time with increase in other pain and other illness. Believe overall deconditioning is increasing generalized hip/low back pain and we discussed ways to be more active, like pushing cart at grocery store. She is making good effort to increase activity throughout the day. She has seen worsening of urinary urgency; we discussed double voiding and urge drill to help combat this. Believe that anterior vaginal wall laxity may be preventing full bladder emptying. We worked on squatting form in order to improve ease and ergonomics. She is still very limited with lumbar A/ROM. Good tolerance to all manual techniques this treatment session. She will continue to benefit from skilled PT intervention in order to decrease pain, improve functional ability, increase ease of starting stream/emptying bladder, and improve QOL.    OBJECTIVE IMPAIRMENTS: decreased activity tolerance, decreased coordination, decreased endurance, decreased mobility, decreased ROM, decreased strength, hypomobility, increased fascial restrictions, increased muscle spasms, impaired  flexibility, impaired tone, postural dysfunction, and pain.    ACTIVITY LIMITATIONS: bending, sitting, standing, stairs, transfers, bed mobility, and locomotion level   PARTICIPATION LIMITATIONS: cleaning, laundry, interpersonal relationship, driving, and community activity   PERSONAL FACTORS: 3+ comorbidities: Breast augmentation surgeries with follow-up reduction with complication, c/s, gastric bypass, bil tubal ligation, long history of chronic low back/pelvic pain, pudendal neuralgia, coccydynia, Lt glut med repair  are also affecting patient's functional outcome.    REHAB POTENTIAL: Good   CLINICAL DECISION MAKING: Stable/uncomplicated   EVALUATION COMPLEXITY: Low     GOALS: Goals reviewed with patient? Yes   SHORT TERM GOALS: Target date: 03/31/2022 - updated 03/26/22 updated 05/14/22 - updated 06/16/22 - updated 2/26 new target 09/01/2022 - updated 08/21/22 - updated 09/01/22 with new goal 11/11/2022    Pt will be independent with HEP.    Baseline: Goal status: MET 03/26/22   2.  Pt will be independent with diaphragmatic breathing and down training activities in order to improve pelvic floor relaxation and abdominal wall mobility.   Baseline:  Goal status: MET 05/14/22   3.  Pt will report improve stream initiation and complete voiding with urination in order to improve QOL. Baseline:  Goal status: IN PROGRESS              4.  Pt will increase all impaired lumbar A/ROM by 25% without pain.   Baseline:  Goal status: IN PROGRESS   LONG TERM GOALS: Target date: 04/28/2022 - updated 03/26/22 - updated 05/14/22 - updated 06/16/22 - updated 06/23/22 new target 09/01/2022 - updated 08/21/22 - updated 09/01/22 with new goal 11/11/2022    Pt will be independent with advanced HEP.    Baseline:  Goal status:IN PROGRESS   2.  Pt will be able to use regular toilet without difficulty. Baseline: She has returned to raised toilet seat 100% of the time Goal status: IN PROGRESS   3.  Pt will be  able to ascend/descend 12 steps at regular intervals throughout the day safely and with minimal difficulty.  Baseline: Pt has not been working on steps due to increase in low back pain - she has been trying to go up 1x/day but it has been more difficult Goal status: IN PROGRESS   4.  Pt will improve 5xSTS to less than 20 seconds and improved PFIQ by 25% in order to demonstrate improved QOL and functional ability.  Baseline: 28 seconds Goal status: IN PROGRESS   5.  Patient  will report no greater than 5/10 pain with sitting in order to more easily attend appointments, drive, and spend time with family.  Baseline:  Goal status:IN PROGRESS   6.  Pt will demonstrate increase in all impaired hip strength by 1 muscle grades in order to demonstrate improved lumbopelvic support and increase functional ability.      Baseline:  Goal status: IN PROGRESS   PLAN:   PT FREQUENCY: 1x/week   PT DURATION: 10 weeks   PLANNED INTERVENTIONS: Therapeutic exercises, Therapeutic activity, Neuromuscular re-education, Balance training, Gait training, Patient/Family education, Self Care, Joint mobilization, Aquatic Therapy, Dry Needling, Biofeedback, and Manual therapy   PLAN FOR NEXT SESSION: Squatting to practice using toilet without raised seat; manual techniques.    Julio Alm, PT, DPT05/10/2408:25 AM

## 2022-09-02 NOTE — Patient Instructions (Signed)
Double-voiding:  This technique is to help with post-void dribbling, or leaking a little bit when you stand up right after urinating.  Use relaxed toileting mechanics to urinate as much as you feel like you have to without straining.  Sit back upright from leaning forward and relax this way for 10-20 seconds.  Lean forward again to finish voiding any amount more.     Urge suppression technique: A technique to help you hold urine until it's an appropriate time to go, whether this is making it home or trying to reach a specific voiding time frame according to your schedule. It helps to send signals from the bladder to the brain that say you don't actually have to void urine right now. This most likely only give you several minutes of relief at first, but repeat as needed; the benefit will last longer as you use this technique more and get into better bladder habits. ?  The technique: o Perform 5 quick flicks (Kegels) rapidly, not worrying about fully relaxing in between each (only in this technique). o Then perform several deep belly breaths while focusing on relaxing the pelvic floor. o Go do something else to help distract yourself from the urge to urinate. o Repeat as needed.   [facility] [address], [address] [place], [place] [address] Phone # [phone] Fax [phone]  

## 2022-09-09 DIAGNOSIS — D259 Leiomyoma of uterus, unspecified: Secondary | ICD-10-CM | POA: Diagnosis not present

## 2022-09-09 DIAGNOSIS — N898 Other specified noninflammatory disorders of vagina: Secondary | ICD-10-CM | POA: Diagnosis not present

## 2022-09-09 DIAGNOSIS — Z01411 Encounter for gynecological examination (general) (routine) with abnormal findings: Secondary | ICD-10-CM | POA: Diagnosis not present

## 2022-09-09 DIAGNOSIS — B372 Candidiasis of skin and nail: Secondary | ICD-10-CM | POA: Diagnosis not present

## 2022-09-09 DIAGNOSIS — Z113 Encounter for screening for infections with a predominantly sexual mode of transmission: Secondary | ICD-10-CM | POA: Diagnosis not present

## 2022-09-09 DIAGNOSIS — B3731 Acute candidiasis of vulva and vagina: Secondary | ICD-10-CM | POA: Diagnosis not present

## 2022-09-09 DIAGNOSIS — Z124 Encounter for screening for malignant neoplasm of cervix: Secondary | ICD-10-CM | POA: Diagnosis not present

## 2022-09-09 DIAGNOSIS — Z6841 Body Mass Index (BMI) 40.0 and over, adult: Secondary | ICD-10-CM | POA: Diagnosis not present

## 2022-09-09 DIAGNOSIS — Z01419 Encounter for gynecological examination (general) (routine) without abnormal findings: Secondary | ICD-10-CM | POA: Diagnosis not present

## 2022-09-09 DIAGNOSIS — N6310 Unspecified lump in the right breast, unspecified quadrant: Secondary | ICD-10-CM | POA: Diagnosis not present

## 2022-09-09 DIAGNOSIS — Z1231 Encounter for screening mammogram for malignant neoplasm of breast: Secondary | ICD-10-CM | POA: Diagnosis not present

## 2022-09-10 ENCOUNTER — Other Ambulatory Visit: Payer: Self-pay | Admitting: Obstetrics and Gynecology

## 2022-09-10 ENCOUNTER — Ambulatory Visit: Payer: PPO

## 2022-09-10 DIAGNOSIS — N6310 Unspecified lump in the right breast, unspecified quadrant: Secondary | ICD-10-CM

## 2022-09-11 ENCOUNTER — Encounter: Payer: Self-pay | Admitting: Gastroenterology

## 2022-09-12 DIAGNOSIS — R928 Other abnormal and inconclusive findings on diagnostic imaging of breast: Secondary | ICD-10-CM | POA: Diagnosis not present

## 2022-09-12 DIAGNOSIS — N6320 Unspecified lump in the left breast, unspecified quadrant: Secondary | ICD-10-CM | POA: Diagnosis not present

## 2022-09-12 DIAGNOSIS — N644 Mastodynia: Secondary | ICD-10-CM | POA: Diagnosis not present

## 2022-09-12 DIAGNOSIS — R92333 Mammographic heterogeneous density, bilateral breasts: Secondary | ICD-10-CM | POA: Diagnosis not present

## 2022-09-12 LAB — HM MAMMOGRAPHY

## 2022-09-15 DIAGNOSIS — F411 Generalized anxiety disorder: Secondary | ICD-10-CM | POA: Diagnosis not present

## 2022-09-15 DIAGNOSIS — F900 Attention-deficit hyperactivity disorder, predominantly inattentive type: Secondary | ICD-10-CM | POA: Diagnosis not present

## 2022-09-15 DIAGNOSIS — F331 Major depressive disorder, recurrent, moderate: Secondary | ICD-10-CM | POA: Diagnosis not present

## 2022-09-17 ENCOUNTER — Ambulatory Visit: Payer: PPO

## 2022-09-17 DIAGNOSIS — M25552 Pain in left hip: Secondary | ICD-10-CM

## 2022-09-17 DIAGNOSIS — R293 Abnormal posture: Secondary | ICD-10-CM

## 2022-09-17 DIAGNOSIS — G8929 Other chronic pain: Secondary | ICD-10-CM

## 2022-09-17 DIAGNOSIS — R279 Unspecified lack of coordination: Secondary | ICD-10-CM

## 2022-09-17 DIAGNOSIS — M62838 Other muscle spasm: Secondary | ICD-10-CM

## 2022-09-17 DIAGNOSIS — M6281 Muscle weakness (generalized): Secondary | ICD-10-CM

## 2022-09-17 NOTE — Therapy (Signed)
OUTPATIENT PHYSICAL THERAPY TREATMENT NOTE  See note below for Objective Data and Assessment of Progress/Goals.    Patient Name: Katelyn [name] MRN: [id number] DOB:January 25, 1976, 47 y.o., female Today's Date: 09/17/2022  PCP: Babs Sciara, MD  REFERRING PROVIDER: Despina Arias, MD  END OF SESSION:   PT End of Session - 09/17/22 0756     Visit Number 38    Date for PT Re-Evaluation 11/11/22    Authorization Type HTA    Progress Note Due on Visit 40    PT Start Time 0800    PT Stop Time 0840    PT Time Calculation (min) 40 min    Activity Tolerance Patient tolerated treatment well    Behavior During Therapy [facility] for tasks assessed/performed                  Past Medical History:  Diagnosis Date   Anemia    Anorexia    Anxiety    Asthma    controlled with meds   Benign juvenile melanoma    Chronic headaches    Colon polyps    found on colonoscopy 04/26/2012   Complication of anesthesia    itching after epidural for c section   Constipation    Depression    Depression    several suicide attempts, hospitaluzed in 2012 for this; hx pf ECT treatments ; pt sees Dr. Lolly Mustache pyschiatrist  and doing well on medication   Dysrhythmia    hx of PVC's (pt hasn't seen cardiologist since 2021- no follow up needed)   GERD (gastroesophageal reflux disease)    Headache    Heart murmur    as a child - no one mentions hearing murmur anymore, sometimes it is heard. Echo has been performed 02/03/18   Heartburn    no meds   History of pneumonia    x 3  years ago - no recent problems   Hx of blood clots    Left renal atrophy 12/19/2021   Neuromuscular disorder (HCC)    small fiber neuropathy   Obesity    Pneumonia    Polycystic ovary    takes metformin to treat   Renal atrophy, left 2023   Skin lesion    Excisional biopsy of moles - none cancerous   Sleep apnea    yrs ago - diagnosed mild sleep apnea - did not have to use cpap    Past Surgical History:  Procedure  Laterality Date   BREAST CAPSULECTOMY Bilateral 12/13/2020   1 week later pt had hematoma surgery on left breast   BREAST ENHANCEMENT SURGERY Bilateral 02/11/2017   BREATH TEK H PYLORI N/A 07/22/2013   Procedure: BREATH TEK H PYLORI;  Surgeon: Mariella Saa, MD;  Location: [facility];  Service: General;  Laterality: N/A;   CESAREAN SECTION  2004, 2007   x 2   COLONOSCOPY  2013   CYSTOSCOPY WITH URETHRAL DILATATION  age 66    GASTRIC ROUX-EN-Y N/A 10/31/2013   Procedure: LAPAROSCOPIC ROUX-EN-Y GASTRIC BYPASS WITH UPPER ENDOSCOPY;  Surgeon: Mariella Saa, MD;  Location: [facility];  Service: General;  Laterality: N/A;   GLUTEUS MINIMUS REPAIR Left 05/28/2021   Procedure: Left GLUTEUS Medius REPAIR;  Surgeon: Huel Cote, MD;  Location: [facility];  Service: Orthopedics;  Laterality: Left;   ingrown toenail Right    Ingrown nail on great right toe   kidney stent  08/28/2014   mole excision     "benign juvenile melanoma" removed  from left leg - inner thigh   POLYPECTOMY     2013   SKIN LESION EXCISION     back   TONSILLECTOMY  age 49   TUBAL LIGATION  2018   Patient Active Problem List   Diagnosis Date Noted   Left renal atrophy 12/19/2021   Stage 2 chronic kidney disease 11/20/2021   Meningitis 09/27/2021   Epigastric pain 09/27/2021   GERD (gastroesophageal reflux disease) 09/27/2021   Rash and nonspecific skin eruption 09/27/2021   Hypocalcemia 09/27/2021   Chronic pain syndrome 09/27/2021   Sepsis (HCC)    Fever 09/25/2021   Tear of left gluteus medius tendon    Asthma 07/18/2020   Vitamin B 12 deficiency 10/16/2019   Elevated LFTs 10/16/2019   Asthma, exogenous, unspecified asthma severity, uncomplicated 01/04/2019   B12 deficiency anemia 06/21/2018   Venous insufficiency 04/23/2018   Onychomycosis of toenail 04/17/2018   Chronic myofascial pain 03/18/2018   Small fiber neuropathy 03/18/2018   Idiopathic small fiber sensory neuropathy 02/23/2018   Varicose  veins of both lower extremities 01/13/2018   Cervical spondylosis without myelopathy 08/28/2017   Migraine with aura and without status migrainosus, not intractable 07/22/2017   Lumbar degenerative disc disease 07/20/2017   Spondylosis of lumbar spine 07/20/2017   Coccydynia 03/12/2017   Bariatric surgery status 05/02/2016   Major depressive disorder, recurrent episode, moderate (HCC) 02/18/2016   Tachy-brady syndrome (HCC) 12/19/2015   Iron deficiency anemia 06/10/2015   Sleep disorder 06/01/2013   Generalized anxiety disorder 05/30/2013   Chronic headaches 07/01/2012   Headache 07/01/2012   Depression, major, recurrent, severe with psychosis (HCC) 06/17/2012   Severe recurrent major depressive disorder with psychotic symptoms (HCC) 06/17/2012   Family history of colonic polyps 03/03/2012   Eating disorder 12/18/2011   Nausea & vomiting 01/22/2010   Polycystic ovarian syndrome 09/01/2008   Intermittent left-sided chest pain 08/02/2008    REFERRING DIAG: M25.511,G89.29,M25.512 (ICD-10-CM) - Chronic pain of both shoulders S76.012A (ICD-10-CM) - Tear of left gluteus medius tendon, initial encounter  THERAPY DIAG:  Abnormal posture  Muscle weakness (generalized)  Other muscle spasm  Pain in left hip  Unspecified lack of coordination  Chronic left shoulder pain  Rationale for Evaluation and Treatment Rehabilitation  PERTINENT HISTORY: Breast augmentation surgeries with follow-up reduction with complication, c/s, gastric bypass, bil tubal ligation, long history of chronic low back/pelvic pain, pudendal neuralgia, coccydynia, Lt glute med repair  PRECAUTIONS: NA  SUBJECTIVE:  SUBJECTIVE STATEMENT:  Pt has been to see gynecologist and had a lot of pain with examination. She has been working on double  voiding and states that when she sits up to reposition is when she empties, but she is able to empty her bladder better. She has more urgency in the morning and then before bed. She states that it is hard to no if she is squeezing in the urge drill.   Problem list: -pudendal neuroalgia/intermittent labial pain -Bil hip and low back pain -being unable to sit -deconditioning -urinary urgency   PAIN:  Are you having pain? Yes, 7/10, Bil hip/low back   03/03/22 SUBJECTIVE STATEMENT: Pt had surgery for Lt glute med tear that most likely happened during fall 3 years ago. She has still had pain in Lt hip, but she is in rehab due to it. She had meningitis in June and ended up having Bil frozen shoulders. Her biggest complaint is of back pain, which impacts her bil shoulder pain because she has to lay on either side. She mostly lies on Rt side, which causes pain in Rt low back and Rt hip. She is rarely getting pressure off of buttocks. She continues to have sacral/coccydynia with sitting and avoids this position. She is not currently having vaginal/pelvic pain, but states this is coming and going - she manages by decreasing frequency of intercourse - no 1x/week. She is having issue with Lt kidney, but MD states that urinary issues are not related.  Is currently in PT for hip and shoulders - primary working on shoulders. Is only doing some basic leg strengthening and shoulder exercises at home.  Fluid intake: Yes: -     PAIN:  Are you having pain? Yes NPRS scale: 8/10Right and Left Pain location:    Pain type: sharp and pressure Pain description: constant    Aggravating factors: sitting, driving, finding comfortable position Relieving factors: lying on stomach relieves buttock pain if she can, but shoulders prevent this   PRECAUTIONS: None   WEIGHT BEARING RESTRICTIONS: No   FALLS:  Has patient fallen in last 6 months? No   LIVING ENVIRONMENT: Lives with: lives with their family Lives in:  House/apartment   OCCUPATION: disabled   PLOF: Independent   PATIENT GOALS: would like to be able to sit; decrease bil hip/low back pain   PERTINENT HISTORY:  Breast augmentation surgeries with follow-up reduction with complication, c/s, gastric bypass, bil tubal ligation, long history of chronic low back/pelvic pain, pudendal neuralgia, coccydynia, Lt glute med repair Sexual abuse: No   BOWEL MOVEMENT: Pain with bowel movement: No Type of bowel movement:Frequency 1x/day and Strain Yes Fully empty rectum: Yes: - Leakage: No Pads: No Fiber supplement: No no medication to help stimulate bowel movements   URINATION: Pain with urination: No Fully empty bladder: No Stream:  trouble starting the stream Urgency: no Frequency: every 30 minutes to an hour - worse once she does go, then she has to keep going - easier to just hold it Leakage:  none Pads: No   INTERCOURSE: Pain with intercourse: Initial Penetration and During Penetration Ability to have vaginal penetration:  Yes: pain - only tolerates 1x/week Climax: yes Marinoff Scale: 2/3   PREGNANCY: Vaginal deliveries 0 Tearing No C-section deliveries 2 Currently pregnant No       OBJECTIVE:  09/02/22: Bil trigger points and myofascial restriction in bil glutes/TFL/lumbar paraspinals  Squat: initially anterior weight translation and bil heel lift; made good corrections with cues and getting weight  more posterior  Lumbar A/ROM: extension 20%, flexion 20%, lateral flexion bil 50%  06/23/22: Significant restriction in Rt hip with trigger points palpated Rt posterior/Lt anterior innominate rotation possible in pelvic  03/03/22: DIAGNOSTIC FINDINGS:  Lasix renogram with no abnormal findings   PATIENT SURVEYS:    PFIQ-7 100%   COGNITION: Overall cognitive status: Within functional limits for tasks assessed                          SENSATION: Light touch: Appears intact Proprioception: Appears intact   MUSCLE  LENGTH:     FUNCTIONAL TESTS:              5xSTS: 29 seconds   GAIT:   Comments: decreased bil hip extension, posterior pelvic tilt   POSTURE: rounded shoulders, forward head, increased lumbar lordosis, and posterior pelvic tilt   PELVIC ALIGNMENT:   LUMBARAROM/PROM:   A/PROM A/PROM  Eval (%)  Flexion 50, pain on Rt  Extension 20, no pain, very limited mobility  Right lateral flexion 50, pain in Rt low back  Left lateral flexion 75, pain in Rt low back  Right rotation 50, good stretch  Left rotation 50, good stretch   (Blank rows = not tested)     LOWER EXTREMITY MMT: tested in standing due to comfort             Rt: 3/5 hip abduction and extension, 4/5 adduction and flexion             Lt: 3/5 hip abduction, extension, flexion, 4/5 adduction     PALPATION:   General  tenderness throughout bil lumbar paraspinals and gluts, Lt>Rt; tenderness and spasm in thoracic paraspinals with decreased mobility upon deep breathing; scar tissue restriction under bil breasts that is tender; scar tissue restriction Lt glut med surgical incision                 External Perineal Exam NA                             Internal Pelvic Floor NA   Patient confirms identification and approves PT to assess internal pelvic floor and treatment Yes for future need   PELVIC MMT:   MMT eval  Vaginal    Internal Anal Sphincter    External Anal Sphincter    Puborectalis    Diastasis Recti    (Blank rows = not tested)         TONE: NA   PROLAPSE: NA   TODAY'S TREATMENT  Manual: Soft tissue mobilization to low back/hips Myofascial release of low back, bil hips, and coccyx Neuromuscular re-education: Pelvic floor muscle contraction training Therapeutic activities: Urge drill Doing something fun each day to help with chronic pain  09/02/22 RE-EVAL Manual: Soft tissue mobilization to low back/hips Myofascial release of low back and bil hips Therapeutic activities: Double voiding   Urge drill  Squat form/ergonomics Using shopping cart at grocery store    TREATMENT 08/21/22 Manual: Trigger Point Dry-Needling  Treatment instructions: Expect mild to moderate muscle soreness. S/S of pneumothorax if dry needled over a lung field, and to seek immediate medical attention should they occur. Patient verbalized understanding of these instructions and education.  Patient Consent Given: Yes Education handout provided: Previously provided Muscles treated: Lt glutes Electrical stimulation performed: No Parameters: N/A Treatment response/outcome: Twitch response and release Soft tissue mobilization to low back/hips Myofascial release of  low back and bil hips  PATIENT EDUCATION:  Education details: see above self-care Person educated: Patient Education method: Explanation, Demonstration, Tactile cues, Verbal cues, and Handouts Education comprehension: verbalized understanding   HOME EXERCISE PROGRAM: Written handout   ASSESSMENT:   CLINICAL IMPRESSION: Discussed benefit of continuing pelvic floor treatment to address tightness and pain along Lt side of coccyx; pt wants to hold off on this until yeast infection is better. Good tolerance to all manual techniques with reduced pain/tension. Believe beginning to use gentle pelvic floor active range of motion will help with motor control, circulation, and relaxation of pelvic floor. We discussed doing something fun/relaxing each day just for herself to help with chronic pain. Good tolerance to all manual techniques this treatment session. She will continue to benefit from skilled PT intervention in order to decrease pain, improve functional ability, increase ease of starting stream/emptying bladder, and improve QOL.    OBJECTIVE IMPAIRMENTS: decreased activity tolerance, decreased coordination, decreased endurance, decreased mobility, decreased ROM, decreased strength, hypomobility, increased fascial restrictions, increased muscle  spasms, impaired flexibility, impaired tone, postural dysfunction, and pain.    ACTIVITY LIMITATIONS: bending, sitting, standing, stairs, transfers, bed mobility, and locomotion level   PARTICIPATION LIMITATIONS: cleaning, laundry, interpersonal relationship, driving, and community activity   PERSONAL FACTORS: 3+ comorbidities: Breast augmentation surgeries with follow-up reduction with complication, c/s, gastric bypass, bil tubal ligation, long history of chronic low back/pelvic pain, pudendal neuralgia, coccydynia, Lt glut med repair  are also affecting patient's functional outcome.    REHAB POTENTIAL: Good   CLINICAL DECISION MAKING: Stable/uncomplicated   EVALUATION COMPLEXITY: Low     GOALS: Goals reviewed with patient? Yes   SHORT TERM GOALS: Target date: 03/31/2022 - updated 03/26/22 updated 05/14/22 - updated 06/16/22 - updated 2/26 new target 09/01/2022 - updated 08/21/22 - updated 09/01/22 with new goal 11/11/2022    Pt will be independent with HEP.    Baseline: Goal status: MET 03/26/22   2.  Pt will be independent with diaphragmatic breathing and down training activities in order to improve pelvic floor relaxation and abdominal wall mobility.   Baseline:  Goal status: MET 05/14/22   3.  Pt will report improve stream initiation and complete voiding with urination in order to improve QOL. Baseline:  Goal status: IN PROGRESS              4.  Pt will increase all impaired lumbar A/ROM by 25% without pain.   Baseline:  Goal status: IN PROGRESS   LONG TERM GOALS: Target date: 04/28/2022 - updated 03/26/22 - updated 05/14/22 - updated 06/16/22 - updated 06/23/22 new target 09/01/2022 - updated 08/21/22 - updated 09/01/22 with new goal 11/11/2022    Pt will be independent with advanced HEP.    Baseline:  Goal status:IN PROGRESS   2.  Pt will be able to use regular toilet without difficulty. Baseline: She has returned to raised toilet seat 100% of the time Goal status: IN PROGRESS    3.  Pt will be able to ascend/descend 12 steps at regular intervals throughout the day safely and with minimal difficulty.  Baseline: Pt has not been working on steps due to increase in low back pain - she has been trying to go up 1x/day but it has been more difficult Goal status: IN PROGRESS   4.  Pt will improve 5xSTS to less than 20 seconds and improved PFIQ by 25% in order to demonstrate improved QOL and functional ability.  Baseline: 28 seconds Goal status:  IN PROGRESS   5.  Patient will report no greater than 5/10 pain with sitting in order to more easily attend appointments, drive, and spend time with family.  Baseline:  Goal status:IN PROGRESS   6.  Pt will demonstrate increase in all impaired hip strength by 1 muscle grades in order to demonstrate improved lumbopelvic support and increase functional ability.      Baseline:  Goal status: IN PROGRESS   PLAN:   PT FREQUENCY: 1x/week   PT DURATION: 10 weeks   PLANNED INTERVENTIONS: Therapeutic exercises, Therapeutic activity, Neuromuscular re-education, Balance training, Gait training, Patient/Family education, Self Care, Joint mobilization, Aquatic Therapy, Dry Needling, Biofeedback, and Manual therapy   PLAN FOR NEXT SESSION: Continue to progress strengthening to tolerance; possible internal pelvic floor muscle release.    Julio Alm, PT, DPT05/22/248:46 AM

## 2022-09-18 ENCOUNTER — Inpatient Hospital Stay: Payer: PPO | Attending: Physician Assistant

## 2022-09-18 DIAGNOSIS — D72829 Elevated white blood cell count, unspecified: Secondary | ICD-10-CM | POA: Insufficient documentation

## 2022-09-18 DIAGNOSIS — D7589 Other specified diseases of blood and blood-forming organs: Secondary | ICD-10-CM

## 2022-09-18 LAB — CBC WITH DIFFERENTIAL/PLATELET
Abs Immature Granulocytes: 0.06 10*3/uL (ref 0.00–0.07)
Basophils Absolute: 0 10*3/uL (ref 0.0–0.1)
Basophils Relative: 0 %
Eosinophils Absolute: 0.1 10*3/uL (ref 0.0–0.5)
Eosinophils Relative: 1 %
HCT: 44.2 % (ref 36.0–46.0)
Hemoglobin: 14.5 g/dL (ref 12.0–15.0)
Immature Granulocytes: 1 %
Lymphocytes Relative: 19 %
Lymphs Abs: 2.2 10*3/uL (ref 0.7–4.0)
MCH: 32.1 pg (ref 26.0–34.0)
MCHC: 32.8 g/dL (ref 30.0–36.0)
MCV: 97.8 fL (ref 80.0–100.0)
Monocytes Absolute: 0.6 10*3/uL (ref 0.1–1.0)
Monocytes Relative: 5 %
Neutro Abs: 8.4 10*3/uL — ABNORMAL HIGH (ref 1.7–7.7)
Neutrophils Relative %: 74 %
Platelets: 376 10*3/uL (ref 150–400)
RBC: 4.52 MIL/uL (ref 3.87–5.11)
RDW: 13 % (ref 11.5–15.5)
WBC: 11.4 10*3/uL — ABNORMAL HIGH (ref 4.0–10.5)
nRBC: 0 % (ref 0.0–0.2)

## 2022-09-19 ENCOUNTER — Other Ambulatory Visit: Payer: Self-pay

## 2022-09-25 ENCOUNTER — Ambulatory Visit: Payer: PPO

## 2022-09-25 ENCOUNTER — Encounter: Payer: Self-pay | Admitting: Hematology

## 2022-09-25 DIAGNOSIS — M6281 Muscle weakness (generalized): Secondary | ICD-10-CM

## 2022-09-25 DIAGNOSIS — M62838 Other muscle spasm: Secondary | ICD-10-CM

## 2022-09-25 DIAGNOSIS — R279 Unspecified lack of coordination: Secondary | ICD-10-CM

## 2022-09-25 DIAGNOSIS — R293 Abnormal posture: Secondary | ICD-10-CM | POA: Diagnosis not present

## 2022-09-25 DIAGNOSIS — M25552 Pain in left hip: Secondary | ICD-10-CM

## 2022-09-25 NOTE — Therapy (Signed)
OUTPATIENT PHYSICAL THERAPY TREATMENT NOTE  See note below for Objective Data and Assessment of Progress/Goals.    Patient Name: Katelyn [name] MRN: [id number] DOB:04/04/1976, 47 y.o., female Today's Date: 09/25/2022  PCP: Babs Sciara, MD  REFERRING PROVIDER: Despina Arias, MD  END OF SESSION:   PT End of Session - 09/25/22 0800     Visit Number 39    Date for PT Re-Evaluation 11/11/22    Authorization Type HTA    Progress Note Due on Visit 40    PT Start Time 0800    PT Stop Time 0840    PT Time Calculation (min) 40 min    Activity Tolerance Patient tolerated treatment well    Behavior During Therapy [facility] for tasks assessed/performed                   Past Medical History:  Diagnosis Date   Anemia    Anorexia    Anxiety    Asthma    controlled with meds   Benign juvenile melanoma    Chronic headaches    Colon polyps    found on colonoscopy 04/26/2012   Complication of anesthesia    itching after epidural for c section   Constipation    Depression    Depression    several suicide attempts, hospitaluzed in 2012 for this; hx pf ECT treatments ; pt sees Dr. Lolly Mustache pyschiatrist  and doing well on medication   Dysrhythmia    hx of PVC's (pt hasn't seen cardiologist since 2021- no follow up needed)   GERD (gastroesophageal reflux disease)    Headache    Heart murmur    as a child - no one mentions hearing murmur anymore, sometimes it is heard. Echo has been performed 02/03/18   Heartburn    no meds   History of pneumonia    x 3  years ago - no recent problems   Hx of blood clots    Left renal atrophy 12/19/2021   Neuromuscular disorder (HCC)    small fiber neuropathy   Obesity    Pneumonia    Polycystic ovary    takes metformin to treat   Renal atrophy, left 2023   Skin lesion    Excisional biopsy of moles - none cancerous   Sleep apnea    yrs ago - diagnosed mild sleep apnea - did not have to use cpap    Past Surgical History:   Procedure Laterality Date   BREAST CAPSULECTOMY Bilateral 12/13/2020   1 week later pt had hematoma surgery on left breast   BREAST ENHANCEMENT SURGERY Bilateral 02/11/2017   BREATH TEK H PYLORI N/A 07/22/2013   Procedure: BREATH TEK H PYLORI;  Surgeon: Mariella Saa, MD;  Location: [facility];  Service: General;  Laterality: N/A;   CESAREAN SECTION  2004, 2007   x 2   COLONOSCOPY  2013   CYSTOSCOPY WITH URETHRAL DILATATION  age 36    GASTRIC ROUX-EN-Y N/A 10/31/2013   Procedure: LAPAROSCOPIC ROUX-EN-Y GASTRIC BYPASS WITH UPPER ENDOSCOPY;  Surgeon: Mariella Saa, MD;  Location: [facility];  Service: General;  Laterality: N/A;   GLUTEUS MINIMUS REPAIR Left 05/28/2021   Procedure: Left GLUTEUS Medius REPAIR;  Surgeon: Huel Cote, MD;  Location: [facility];  Service: Orthopedics;  Laterality: Left;   ingrown toenail Right    Ingrown nail on great right toe   kidney stent  08/28/2014   mole excision     "benign juvenile melanoma"  removed from left leg - inner thigh   POLYPECTOMY     2013   SKIN LESION EXCISION     back   TONSILLECTOMY  age 71   TUBAL LIGATION  2018   Patient Active Problem List   Diagnosis Date Noted   Left renal atrophy 12/19/2021   Stage 2 chronic kidney disease 11/20/2021   Meningitis 09/27/2021   Epigastric pain 09/27/2021   GERD (gastroesophageal reflux disease) 09/27/2021   Rash and nonspecific skin eruption 09/27/2021   Hypocalcemia 09/27/2021   Chronic pain syndrome 09/27/2021   Sepsis (HCC)    Fever 09/25/2021   Tear of left gluteus medius tendon    Asthma 07/18/2020   Vitamin B 12 deficiency 10/16/2019   Elevated LFTs 10/16/2019   Asthma, exogenous, unspecified asthma severity, uncomplicated 01/04/2019   B12 deficiency anemia 06/21/2018   Venous insufficiency 04/23/2018   Onychomycosis of toenail 04/17/2018   Chronic myofascial pain 03/18/2018   Small fiber neuropathy 03/18/2018   Idiopathic small fiber sensory neuropathy 02/23/2018    Varicose veins of both lower extremities 01/13/2018   Cervical spondylosis without myelopathy 08/28/2017   Migraine with aura and without status migrainosus, not intractable 07/22/2017   Lumbar degenerative disc disease 07/20/2017   Spondylosis of lumbar spine 07/20/2017   Coccydynia 03/12/2017   Bariatric surgery status 05/02/2016   Major depressive disorder, recurrent episode, moderate (HCC) 02/18/2016   Tachy-brady syndrome (HCC) 12/19/2015   Iron deficiency anemia 06/10/2015   Sleep disorder 06/01/2013   Generalized anxiety disorder 05/30/2013   Chronic headaches 07/01/2012   Headache 07/01/2012   Depression, major, recurrent, severe with psychosis (HCC) 06/17/2012   Severe recurrent major depressive disorder with psychotic symptoms (HCC) 06/17/2012   Family history of colonic polyps 03/03/2012   Eating disorder 12/18/2011   Nausea & vomiting 01/22/2010   Polycystic ovarian syndrome 09/01/2008   Intermittent left-sided chest pain 08/02/2008    REFERRING DIAG: M25.511,G89.29,M25.512 (ICD-10-CM) - Chronic pain of both shoulders S76.012A (ICD-10-CM) - Tear of left gluteus medius tendon, initial encounter  THERAPY DIAG:  Abnormal posture  Muscle weakness (generalized)  Other muscle spasm  Unspecified lack of coordination  Pain in left hip  Rationale for Evaluation and Treatment Rehabilitation  PERTINENT HISTORY: Breast augmentation surgeries with follow-up reduction with complication, c/s, gastric bypass, bil tubal ligation, long history of chronic low back/pelvic pain, pudendal neuralgia, coccydynia, Lt glute med repair  PRECAUTIONS: NA  SUBJECTIVE:                                                                                                                                                                                      SUBJECTIVE STATEMENT:  Pt having Rt sided low back up to Rt shoulder pain that is bad when she is wiping. She is having to push up with her Rt UE  when getting off of toilet and sleeping with her Rt arm above her head, both of which also reproduce the pain  Problem list: -pudendal neuroalgia/intermittent labial pain -Bil hip and low back pain -being unable to sit -deconditioning -urinary urgency   PAIN:  Are you having pain? Yes, 7/10, Bil hip/low back   03/03/22 SUBJECTIVE STATEMENT: Pt had surgery for Lt glute med tear that most likely happened during fall 3 years ago. She has still had pain in Lt hip, but she is in rehab due to it. She had meningitis in June and ended up having Bil frozen shoulders. Her biggest complaint is of back pain, which impacts her bil shoulder pain because she has to lay on either side. She mostly lies on Rt side, which causes pain in Rt low back and Rt hip. She is rarely getting pressure off of buttocks. She continues to have sacral/coccydynia with sitting and avoids this position. She is not currently having vaginal/pelvic pain, but states this is coming and going - she manages by decreasing frequency of intercourse - no 1x/week. She is having issue with Lt kidney, but MD states that urinary issues are not related.  Is currently in PT for hip and shoulders - primary working on shoulders. Is only doing some basic leg strengthening and shoulder exercises at home.  Fluid intake: Yes: -     PAIN:  Are you having pain? Yes NPRS scale: 8/10Right and Left Pain location:    Pain type: sharp and pressure Pain description: constant    Aggravating factors: sitting, driving, finding comfortable position Relieving factors: lying on stomach relieves buttock pain if she can, but shoulders prevent this   PRECAUTIONS: None   WEIGHT BEARING RESTRICTIONS: No   FALLS:  Has patient fallen in last 6 months? No   LIVING ENVIRONMENT: Lives with: lives with their family Lives in: House/apartment   OCCUPATION: disabled   PLOF: Independent   PATIENT GOALS: would like to be able to sit; decrease bil hip/low back  pain   PERTINENT HISTORY:  Breast augmentation surgeries with follow-up reduction with complication, c/s, gastric bypass, bil tubal ligation, long history of chronic low back/pelvic pain, pudendal neuralgia, coccydynia, Lt glute med repair Sexual abuse: No   BOWEL MOVEMENT: Pain with bowel movement: No Type of bowel movement:Frequency 1x/day and Strain Yes Fully empty rectum: Yes: - Leakage: No Pads: No Fiber supplement: No no medication to help stimulate bowel movements   URINATION: Pain with urination: No Fully empty bladder: No Stream:  trouble starting the stream Urgency: no Frequency: every 30 minutes to an hour - worse once she does go, then she has to keep going - easier to just hold it Leakage:  none Pads: No   INTERCOURSE: Pain with intercourse: Initial Penetration and During Penetration Ability to have vaginal penetration:  Yes: pain - only tolerates 1x/week Climax: yes Marinoff Scale: 2/3   PREGNANCY: Vaginal deliveries 0 Tearing No C-section deliveries 2 Currently pregnant No       OBJECTIVE:  09/02/22: Bil trigger points and myofascial restriction in bil glutes/TFL/lumbar paraspinals  Squat: initially anterior weight translation and bil heel lift; made good corrections with cues and getting weight more posterior  Lumbar A/ROM: extension 20%, flexion 20%, lateral flexion bil 50%  06/23/22: Significant restriction in Rt hip with trigger points palpated Rt  posterior/Lt anterior innominate rotation possible in pelvic  03/03/22: DIAGNOSTIC FINDINGS:  Lasix renogram with no abnormal findings   PATIENT SURVEYS:    PFIQ-7 100%   COGNITION: Overall cognitive status: Within functional limits for tasks assessed                          SENSATION: Light touch: Appears intact Proprioception: Appears intact   MUSCLE LENGTH:     FUNCTIONAL TESTS:              5xSTS: 29 seconds   GAIT:   Comments: decreased bil hip extension, posterior pelvic tilt    POSTURE: rounded shoulders, forward head, increased lumbar lordosis, and posterior pelvic tilt   PELVIC ALIGNMENT:   LUMBARAROM/PROM:   A/PROM A/PROM  Eval (%)  Flexion 50, pain on Rt  Extension 20, no pain, very limited mobility  Right lateral flexion 50, pain in Rt low back  Left lateral flexion 75, pain in Rt low back  Right rotation 50, good stretch  Left rotation 50, good stretch   (Blank rows = not tested)     LOWER EXTREMITY MMT: tested in standing due to comfort             Rt: 3/5 hip abduction and extension, 4/5 adduction and flexion             Lt: 3/5 hip abduction, extension, flexion, 4/5 adduction     PALPATION:   General  tenderness throughout bil lumbar paraspinals and gluts, Lt>Rt; tenderness and spasm in thoracic paraspinals with decreased mobility upon deep breathing; scar tissue restriction under bil breasts that is tender; scar tissue restriction Lt glut med surgical incision                 External Perineal Exam NA                             Internal Pelvic Floor NA   Patient confirms identification and approves PT to assess internal pelvic floor and treatment Yes for future need   PELVIC MMT:   MMT eval  Vaginal    Internal Anal Sphincter    External Anal Sphincter    Puborectalis    Diastasis Recti    (Blank rows = not tested)         TONE: NA   PROLAPSE: NA   TODAY'S TREATMENT  09/25/22 Manual: Soft tissue mobilization Bil lumbar paraspinals Bil posterolateral hip muscles Rt thoracic paraspinals Rt scapular muscles Neuromuscular re-education: Serratus protraction/retraction on wall Lat isometric with shoulder depression  09/17/22 Manual: Soft tissue mobilization to low back/hips Myofascial release of low back, bil hips, and coccyx Neuromuscular re-education: Pelvic floor muscle contraction training Therapeutic activities: Urge drill Doing something fun each day to help with chronic pain  09/02/22 RE-EVAL Manual: Soft  tissue mobilization to low back/hips Myofascial release of low back and bil hips Therapeutic activities: Double voiding  Urge drill  Squat form/ergonomics Using shopping cart at grocery store   PATIENT EDUCATION:  Education details: see above self-care Person educated: Patient Education method: Explanation, Demonstration, Tactile cues, Verbal cues, and Handouts Education comprehension: verbalized understanding   HOME EXERCISE PROGRAM: Written handout   ASSESSMENT:   CLINICAL IMPRESSION: Pt doing better functionally, being able to consistently use toilet without the chair, but it is leading to some Rt shoulder/back pain. We addressed this today with isometrics to help improve activation of  paraspinals and scapular muscles for improved support. Believe most of issue is in lats and serratus anterior. She tolerated exercises and manual techniques to this area very well. She will continue to benefit from skilled PT intervention in order to decrease pain, improve functional ability, increase ease of starting stream/emptying bladder, and improve QOL.    OBJECTIVE IMPAIRMENTS: decreased activity tolerance, decreased coordination, decreased endurance, decreased mobility, decreased ROM, decreased strength, hypomobility, increased fascial restrictions, increased muscle spasms, impaired flexibility, impaired tone, postural dysfunction, and pain.    ACTIVITY LIMITATIONS: bending, sitting, standing, stairs, transfers, bed mobility, and locomotion level   PARTICIPATION LIMITATIONS: cleaning, laundry, interpersonal relationship, driving, and community activity   PERSONAL FACTORS: 3+ comorbidities: Breast augmentation surgeries with follow-up reduction with complication, c/s, gastric bypass, bil tubal ligation, long history of chronic low back/pelvic pain, pudendal neuralgia, coccydynia, Lt glut med repair  are also affecting patient's functional outcome.    REHAB POTENTIAL: Good   CLINICAL DECISION  MAKING: Stable/uncomplicated   EVALUATION COMPLEXITY: Low     GOALS: Goals reviewed with patient? Yes   SHORT TERM GOALS: Target date: 03/31/2022 - updated 03/26/22 updated 05/14/22 - updated 06/16/22 - updated 2/26 new target 09/01/2022 - updated 08/21/22 - updated 09/01/22 with new goal 11/11/2022    Pt will be independent with HEP.    Baseline: Goal status: MET 03/26/22   2.  Pt will be independent with diaphragmatic breathing and down training activities in order to improve pelvic floor relaxation and abdominal wall mobility.   Baseline:  Goal status: MET 05/14/22   3.  Pt will report improve stream initiation and complete voiding with urination in order to improve QOL. Baseline:  Goal status: IN PROGRESS              4.  Pt will increase all impaired lumbar A/ROM by 25% without pain.   Baseline:  Goal status: IN PROGRESS   LONG TERM GOALS: Target date: 04/28/2022 - updated 03/26/22 - updated 05/14/22 - updated 06/16/22 - updated 06/23/22 new target 09/01/2022 - updated 08/21/22 - updated 09/01/22 with new goal 11/11/2022    Pt will be independent with advanced HEP.    Baseline:  Goal status:IN PROGRESS   2.  Pt will be able to use regular toilet without difficulty. Baseline: She has returned to raised toilet seat 100% of the time Goal status: IN PROGRESS   3.  Pt will be able to ascend/descend 12 steps at regular intervals throughout the day safely and with minimal difficulty.  Baseline: Pt has not been working on steps due to increase in low back pain - she has been trying to go up 1x/day but it has been more difficult Goal status: IN PROGRESS   4.  Pt will improve 5xSTS to less than 20 seconds and improved PFIQ by 25% in order to demonstrate improved QOL and functional ability.  Baseline: 28 seconds Goal status: IN PROGRESS   5.  Patient will report no greater than 5/10 pain with sitting in order to more easily attend appointments, drive, and spend time with family.  Baseline:   Goal status:IN PROGRESS   6.  Pt will demonstrate increase in all impaired hip strength by 1 muscle grades in order to demonstrate improved lumbopelvic support and increase functional ability.      Baseline:  Goal status: IN PROGRESS   PLAN:   PT FREQUENCY: 1x/week   PT DURATION: 10 weeks   PLANNED INTERVENTIONS: Therapeutic exercises, Therapeutic activity, Neuromuscular re-education, Balance training, Gait  training, Patient/Family education, Self Care, Joint mobilization, Aquatic Therapy, Dry Needling, Biofeedback, and Manual therapy   PLAN FOR NEXT SESSION: Review scapular isometrics for good form; continue manual techniques as needed; consider pelvic floor manual techniques.    Julio Alm, PT, DPT05/30/248:41 AM

## 2022-09-30 ENCOUNTER — Ambulatory Visit: Payer: PPO | Attending: Urology

## 2022-09-30 DIAGNOSIS — M25512 Pain in left shoulder: Secondary | ICD-10-CM | POA: Insufficient documentation

## 2022-09-30 DIAGNOSIS — R279 Unspecified lack of coordination: Secondary | ICD-10-CM | POA: Diagnosis not present

## 2022-09-30 DIAGNOSIS — M25511 Pain in right shoulder: Secondary | ICD-10-CM | POA: Diagnosis not present

## 2022-09-30 DIAGNOSIS — M25552 Pain in left hip: Secondary | ICD-10-CM | POA: Insufficient documentation

## 2022-09-30 DIAGNOSIS — R293 Abnormal posture: Secondary | ICD-10-CM | POA: Insufficient documentation

## 2022-09-30 DIAGNOSIS — G8929 Other chronic pain: Secondary | ICD-10-CM | POA: Diagnosis not present

## 2022-09-30 DIAGNOSIS — M62838 Other muscle spasm: Secondary | ICD-10-CM | POA: Diagnosis not present

## 2022-09-30 DIAGNOSIS — M6281 Muscle weakness (generalized): Secondary | ICD-10-CM | POA: Diagnosis not present

## 2022-09-30 NOTE — Therapy (Signed)
OUTPATIENT PHYSICAL THERAPY TREATMENT NOTE  Progress Note Reporting Period 06/23/22 to 09/30/22  See note below for Objective Data and Assessment of Progress/Goals.      Patient Name: Katelyn [name] MRN: [id number] DOB:[birth year], 47 y.o., female Today's Date: 09/30/2022  PCP: Babs Sciara, MD  REFERRING PROVIDER: Despina Arias, MD  END OF SESSION:   PT End of Session - 09/30/22 0848     Visit Number 40    Date for PT Re-Evaluation 11/11/22    Authorization Type HTA    Progress Note Due on Visit 50    PT Start Time 0845    PT Stop Time 0925    PT Time Calculation (min) 40 min    Activity Tolerance Patient tolerated treatment well    Behavior During Therapy [facility] for tasks assessed/performed                    Past Medical History:  Diagnosis Date   Anemia    Anorexia    Anxiety    Asthma    controlled with meds   Benign juvenile melanoma    Chronic headaches    Colon polyps    found on colonoscopy 04/26/2012   Complication of anesthesia    itching after epidural for c section   Constipation    Depression    Depression    several suicide attempts, hospitaluzed in 2012 for this; hx pf ECT treatments ; pt sees Dr. Lolly Mustache pyschiatrist  and doing well on medication   Dysrhythmia    hx of PVC's (pt hasn't seen cardiologist since 2021- no follow up needed)   GERD (gastroesophageal reflux disease)    Headache    Heart murmur    as a child - no one mentions hearing murmur anymore, sometimes it is heard. Echo has been performed 02/03/18   Heartburn    no meds   History of pneumonia    x 3  years ago - no recent problems   Hx of blood clots    Left renal atrophy 12/19/2021   Neuromuscular disorder (HCC)    small fiber neuropathy   Obesity    Pneumonia    Polycystic ovary    takes metformin to treat   Renal atrophy, left 2023   Skin lesion    Excisional biopsy of moles - none cancerous   Sleep apnea    yrs ago - diagnosed mild sleep apnea - did  not have to use cpap    Past Surgical History:  Procedure Laterality Date   BREAST CAPSULECTOMY Bilateral 12/13/2020   1 week later pt had hematoma surgery on left breast   BREAST ENHANCEMENT SURGERY Bilateral 02/11/2017   BREATH TEK H PYLORI N/A 07/22/2013   Procedure: BREATH TEK H PYLORI;  Surgeon: Mariella Saa, MD;  Location: [facility];  Service: General;  Laterality: N/A;   CESAREAN SECTION  2004, 2007   x 2   COLONOSCOPY  2013   CYSTOSCOPY WITH URETHRAL DILATATION  age 42    GASTRIC ROUX-EN-Y N/A 10/31/2013   Procedure: LAPAROSCOPIC ROUX-EN-Y GASTRIC BYPASS WITH UPPER ENDOSCOPY;  Surgeon: Mariella Saa, MD;  Location: [facility];  Service: General;  Laterality: N/A;   GLUTEUS MINIMUS REPAIR Left 05/28/2021   Procedure: Left GLUTEUS Medius REPAIR;  Surgeon: Huel Cote, MD;  Location: [facility];  Service: Orthopedics;  Laterality: Left;   ingrown toenail Right    Ingrown nail on great right toe   kidney stent  08/28/2014  mole excision     "benign juvenile melanoma" removed from left leg - inner thigh   POLYPECTOMY     2013   SKIN LESION EXCISION     back   TONSILLECTOMY  age 49   TUBAL LIGATION  2018   Patient Active Problem List   Diagnosis Date Noted   Left renal atrophy 12/19/2021   Stage 2 chronic kidney disease 11/20/2021   Meningitis 09/27/2021   Epigastric pain 09/27/2021   GERD (gastroesophageal reflux disease) 09/27/2021   Rash and nonspecific skin eruption 09/27/2021   Hypocalcemia 09/27/2021   Chronic pain syndrome 09/27/2021   Sepsis (HCC)    Fever 09/25/2021   Tear of left gluteus medius tendon    Asthma 07/18/2020   Vitamin B 12 deficiency 10/16/2019   Elevated LFTs 10/16/2019   Asthma, exogenous, unspecified asthma severity, uncomplicated 01/04/2019   B12 deficiency anemia 06/21/2018   Venous insufficiency 04/23/2018   Onychomycosis of toenail 04/17/2018   Chronic myofascial pain 03/18/2018   Small fiber neuropathy 03/18/2018    Idiopathic small fiber sensory neuropathy 02/23/2018   Varicose veins of both lower extremities 01/13/2018   Cervical spondylosis without myelopathy 08/28/2017   Migraine with aura and without status migrainosus, not intractable 07/22/2017   Lumbar degenerative disc disease 07/20/2017   Spondylosis of lumbar spine 07/20/2017   Coccydynia 03/12/2017   Bariatric surgery status 05/02/2016   Major depressive disorder, recurrent episode, moderate (HCC) 02/18/2016   Tachy-brady syndrome (HCC) 12/19/2015   Iron deficiency anemia 06/10/2015   Sleep disorder 06/01/2013   Generalized anxiety disorder 05/30/2013   Chronic headaches 07/01/2012   Headache 07/01/2012   Depression, major, recurrent, severe with psychosis (HCC) 06/17/2012   Severe recurrent major depressive disorder with psychotic symptoms (HCC) 06/17/2012   Family history of colonic polyps 03/03/2012   Eating disorder 12/18/2011   Nausea & vomiting 01/22/2010   Polycystic ovarian syndrome 09/01/2008   Intermittent left-sided chest pain 08/02/2008    REFERRING DIAG: M25.511,G89.29,M25.512 (ICD-10-CM) - Chronic pain of both shoulders S76.012A (ICD-10-CM) - Tear of left gluteus medius tendon, initial encounter  THERAPY DIAG:  Abnormal posture  Muscle weakness (generalized)  Other muscle spasm  Unspecified lack of coordination  Pain in left hip  Chronic left shoulder pain  Rationale for Evaluation and Treatment Rehabilitation  PERTINENT HISTORY: Breast augmentation surgeries with follow-up reduction with complication, c/s, gastric bypass, bil tubal ligation, long history of chronic low back/pelvic pain, pudendal neuralgia, coccydynia, Lt glute med repair  PRECAUTIONS: NA  SUBJECTIVE:  SUBJECTIVE STATEMENT:    Problem list: -pudendal  neuroalgia/intermittent labial pain -Bil hip and low back pain -being unable to sit -deconditioning -urinary urgency   PAIN:  Are you having pain? Yes, 7/10, Bil hip/low back   03/03/22 SUBJECTIVE STATEMENT: Pt had surgery for Lt glute med tear that most likely happened during fall 3 years ago. She has still had pain in Lt hip, but she is in rehab due to it. She had meningitis in June and ended up having Bil frozen shoulders. Her biggest complaint is of back pain, which impacts her bil shoulder pain because she has to lay on either side. She mostly lies on Rt side, which causes pain in Rt low back and Rt hip. She is rarely getting pressure off of buttocks. She continues to have sacral/coccydynia with sitting and avoids this position. She is not currently having vaginal/pelvic pain, but states this is coming and going - she manages by decreasing frequency of intercourse - no 1x/week. She is having issue with Lt kidney, but MD states that urinary issues are not related.  Is currently in PT for hip and shoulders - primary working on shoulders. Is only doing some basic leg strengthening and shoulder exercises at home.  Fluid intake: Yes: -     PAIN:  Are you having pain? Yes NPRS scale: 8/10Right and Left Pain location:    Pain type: sharp and pressure Pain description: constant    Aggravating factors: sitting, driving, finding comfortable position Relieving factors: lying on stomach relieves buttock pain if she can, but shoulders prevent this   PRECAUTIONS: None   WEIGHT BEARING RESTRICTIONS: No   FALLS:  Has patient fallen in last 6 months? No   LIVING ENVIRONMENT: Lives with: lives with their family Lives in: House/apartment   OCCUPATION: disabled   PLOF: Independent   PATIENT GOALS: would like to be able to sit; decrease bil hip/low back pain   PERTINENT HISTORY:  Breast augmentation surgeries with follow-up reduction with complication, c/s, gastric bypass, bil tubal  ligation, long history of chronic low back/pelvic pain, pudendal neuralgia, coccydynia, Lt glute med repair Sexual abuse: No   BOWEL MOVEMENT: Pain with bowel movement: No Type of bowel movement:Frequency 1x/day and Strain Yes Fully empty rectum: Yes: - Leakage: No Pads: No Fiber supplement: No no medication to help stimulate bowel movements   URINATION: Pain with urination: No Fully empty bladder: No Stream:  trouble starting the stream Urgency: no Frequency: every 30 minutes to an hour - worse once she does go, then she has to keep going - easier to just hold it Leakage:  none Pads: No   INTERCOURSE: Pain with intercourse: Initial Penetration and During Penetration Ability to have vaginal penetration:  Yes: pain - only tolerates 1x/week Climax: yes Marinoff Scale: 2/3   PREGNANCY: Vaginal deliveries 0 Tearing No C-section deliveries 2 Currently pregnant No       OBJECTIVE:  09/02/22: Bil trigger points and myofascial restriction in bil glutes/TFL/lumbar paraspinals  Squat: initially anterior weight translation and bil heel lift; made good corrections with cues and getting weight more posterior  Lumbar A/ROM: extension 20%, flexion 20%, lateral flexion bil 50%  06/23/22: Significant restriction in Rt hip with trigger points palpated Rt posterior/Lt anterior innominate rotation possible in pelvic  03/03/22: DIAGNOSTIC FINDINGS:  Lasix renogram with no abnormal findings   PATIENT SURVEYS:    PFIQ-7 100%   COGNITION: Overall cognitive status: Within functional limits for tasks assessed  SENSATION: Light touch: Appears intact Proprioception: Appears intact   MUSCLE LENGTH:     FUNCTIONAL TESTS:              5xSTS: 29 seconds   GAIT:   Comments: decreased bil hip extension, posterior pelvic tilt   POSTURE: rounded shoulders, forward head, increased lumbar lordosis, and posterior pelvic tilt   PELVIC ALIGNMENT:    LUMBARAROM/PROM:   A/PROM A/PROM  Eval (%)  Flexion 50, pain on Rt  Extension 20, no pain, very limited mobility  Right lateral flexion 50, pain in Rt low back  Left lateral flexion 75, pain in Rt low back  Right rotation 50, good stretch  Left rotation 50, good stretch   (Blank rows = not tested)     LOWER EXTREMITY MMT: tested in standing due to comfort             Rt: 3/5 hip abduction and extension, 4/5 adduction and flexion             Lt: 3/5 hip abduction, extension, flexion, 4/5 adduction     PALPATION:   General  tenderness throughout bil lumbar paraspinals and gluts, Lt>Rt; tenderness and spasm in thoracic paraspinals with decreased mobility upon deep breathing; scar tissue restriction under bil breasts that is tender; scar tissue restriction Lt glut med surgical incision                 External Perineal Exam NA                             Internal Pelvic Floor NA   Patient confirms identification and approves PT to assess internal pelvic floor and treatment Yes for future need   PELVIC MMT:   MMT eval  Vaginal    Internal Anal Sphincter    External Anal Sphincter    Puborectalis    Diastasis Recti    (Blank rows = not tested)         TONE: NA   PROLAPSE: NA   TODAY'S TREATMENT  09/30/22 Manual: Soft tissue mobilization Bil lumbar paraspinals Bil posterolateral hip muscles Rt thoracic paraspinals Rt scapular muscles Neuromuscular re-education: Serratus protraction/retraction on wall Lat isometric with shoulder depression    09/25/22 Manual: Soft tissue mobilization Bil lumbar paraspinals Bil posterolateral hip muscles Rt thoracic paraspinals Rt scapular muscles Neuromuscular re-education: Serratus protraction/retraction on wall Lat isometric with shoulder depression  09/17/22 Manual: Soft tissue mobilization to low back/hips Myofascial release of low back, bil hips, and coccyx Neuromuscular re-education: Pelvic floor muscle  contraction training Therapeutic activities: Urge drill Doing something fun each day to help with chronic pain   PATIENT EDUCATION:  Education details: see above self-care Person educated: Patient Education method: Explanation, Demonstration, Tactile cues, Verbal cues, and Handouts Education comprehension: verbalized understanding   HOME EXERCISE PROGRAM: Written handout   ASSESSMENT:   CLINICAL IMPRESSION: Due to persistent and more localized pain under Rt scapula that is preventing her from wiping, exercises and manual treatment performed again to these areas. Trigger points in rhomboids continued to bother her in wall serratus punch after treatment session; we discussed possibility of DN to these muscles next session to help decrease the pain. Good tolerance to all manual techniques in surrounding areas to help reduce restriction. She will continue to benefit from skilled PT intervention in order to decrease pain, improve functional ability, increase ease of starting stream/emptying bladder, and improve QOL.    OBJECTIVE IMPAIRMENTS:  decreased activity tolerance, decreased coordination, decreased endurance, decreased mobility, decreased ROM, decreased strength, hypomobility, increased fascial restrictions, increased muscle spasms, impaired flexibility, impaired tone, postural dysfunction, and pain.    ACTIVITY LIMITATIONS: bending, sitting, standing, stairs, transfers, bed mobility, and locomotion level   PARTICIPATION LIMITATIONS: cleaning, laundry, interpersonal relationship, driving, and community activity   PERSONAL FACTORS: 3+ comorbidities: Breast augmentation surgeries with follow-up reduction with complication, c/s, gastric bypass, bil tubal ligation, long history of chronic low back/pelvic pain, pudendal neuralgia, coccydynia, Lt glut med repair  are also affecting patient's functional outcome.    REHAB POTENTIAL: Good   CLINICAL DECISION MAKING: Stable/uncomplicated    EVALUATION COMPLEXITY: Low     GOALS: Goals reviewed with patient? Yes   SHORT TERM GOALS: Target date: 03/31/2022 - updated 03/26/22 updated 05/14/22 - updated 06/16/22 - updated 2/26 new target 09/01/2022 - updated 08/21/22 - updated 09/01/22 with new goal 11/11/2022 -c updated 09/30/22   Pt will be independent with HEP.    Baseline: Goal status: MET 03/26/22   2.  Pt will be independent with diaphragmatic breathing and down training activities in order to improve pelvic floor relaxation and abdominal wall mobility.   Baseline:  Goal status: MET 05/14/22   3.  Pt will report improve stream initiation and complete voiding with urination in order to improve QOL. Baseline:  Goal status: IN PROGRESS              4.  Pt will increase all impaired lumbar A/ROM by 25% without pain.   Baseline:  Goal status: IN PROGRESS   LONG TERM GOALS: Target date: 04/28/2022 - updated 03/26/22 - updated 05/14/22 - updated 06/16/22 - updated 06/23/22 new target 09/01/2022 - updated 08/21/22 - updated 09/01/22 with new goal 11/11/2022 - updated 09/30/22   Pt will be independent with advanced HEP.    Baseline:  Goal status:IN PROGRESS   2.  Pt will be able to use regular toilet without difficulty. Baseline: She has returned to raised toilet seat 100% of the time Goal status: IN PROGRESS   3.  Pt will be able to ascend/descend 12 steps at regular intervals throughout the day safely and with minimal difficulty.  Baseline: Pt has not been working on steps due to increase in low back pain - she has been trying to go up 1x/day but it has been more difficult Goal status: IN PROGRESS   4.  Pt will improve 5xSTS to less than 20 seconds and improved PFIQ by 25% in order to demonstrate improved QOL and functional ability.  Baseline: 28 seconds Goal status: IN PROGRESS   5.  Patient will report no greater than 5/10 pain with sitting in order to more easily attend appointments, drive, and spend time with family.  Baseline:   Goal status:IN PROGRESS   6.  Pt will demonstrate increase in all impaired hip strength by 1 muscle grades in order to demonstrate improved lumbopelvic support and increase functional ability.      Baseline:  Goal status: IN PROGRESS   PLAN:   PT FREQUENCY: 1x/week   PT DURATION: 10 weeks   PLANNED INTERVENTIONS: Therapeutic exercises, Therapeutic activity, Neuromuscular re-education, Balance training, Gait training, Patient/Family education, Self Care, Joint mobilization, Aquatic Therapy, Dry Needling, Biofeedback, and Manual therapy   PLAN FOR NEXT SESSION: DN to Rt rhomboids; manual techniques as needed; scapular exercises/mobility to improve wiping capability.    Julio Alm, PT, DPT06/04/249:27 AM

## 2022-10-01 ENCOUNTER — Ambulatory Visit (INDEPENDENT_AMBULATORY_CARE_PROVIDER_SITE_OTHER): Payer: PPO | Admitting: Family Medicine

## 2022-10-01 ENCOUNTER — Encounter: Payer: Self-pay | Admitting: Family Medicine

## 2022-10-01 VITALS — BP 126/86 | HR 97

## 2022-10-01 DIAGNOSIS — D72829 Elevated white blood cell count, unspecified: Secondary | ICD-10-CM

## 2022-10-01 DIAGNOSIS — N83209 Unspecified ovarian cyst, unspecified side: Secondary | ICD-10-CM | POA: Diagnosis not present

## 2022-10-01 DIAGNOSIS — Z0001 Encounter for general adult medical examination with abnormal findings: Secondary | ICD-10-CM

## 2022-10-01 DIAGNOSIS — Z0000 Encounter for general adult medical examination without abnormal findings: Secondary | ICD-10-CM

## 2022-10-01 DIAGNOSIS — M25511 Pain in right shoulder: Secondary | ICD-10-CM | POA: Diagnosis not present

## 2022-10-01 DIAGNOSIS — D229 Melanocytic nevi, unspecified: Secondary | ICD-10-CM | POA: Diagnosis not present

## 2022-10-01 NOTE — Progress Notes (Signed)
Subjective:    Patient ID: Katelyn [name], female    DOB: [birth year], 47 y.o.   MRN: [id number]  HPI Patient arrives today for follow up.  Outpatient Encounter Medications as of 10/01/2022  Medication Sig Note   albuterol (VENTOLIN HFA) 108 (90 Base) MCG/ACT inhaler Inhale 2 puffs into the lungs every 6 (six) hours as needed for wheezing or shortness of breath.    buPROPion (WELLBUTRIN) 75 MG tablet Take two tablets (75 mg total dose) by mouth daily. 02/25/2022: Taking a total of 150 mg.daily   Calcium Citrate-Vitamin D 500-12.5 MG-MCG CHEW Chew 1 each by mouth daily.    clonazePAM (KLONOPIN) 0.5 MG tablet TAKE (1) TABLET BY MOUTH TWICE DAILY AS NEEDED FOR ANXIETY. 02/25/2022: Only takes as needed.   desonide (DESOWEN) 0.05 % cream Thin amount bid prn when dermatitis flares    fluconazole (DIFLUCAN) 150 MG tablet Take one now and in 4 days and in 7 days    gabapentin (NEURONTIN) 300 MG capsule Take 600 mg by mouth 3 (three) times daily.    lidocaine (LIDODERM) 5 % Place 1 patch onto the skin daily. Remove & Discard patch within 12 hours or as directed by MD    losartan (COZAAR) 25 MG tablet Take 1/2 tablet po daily (Patient taking differently: Take whole tablet po daily)    methylphenidate (RITALIN) 10 MG tablet Take 10 mg by mouth 2 (two) times daily.    Multiple Vitamins-Minerals (BARIATRIC FUSION) CHEW Chew 1 each by mouth daily.    naloxone (NARCAN) nasal spray 4 mg/0.1 mL Place 1 spray into the nose once.    NONFORMULARY OR COMPOUNDED ITEM Apply 1 to 2 grams (1 gram = 1 pump) to the affected area 3 to 4 times daily 01/24/2022: Compound Cream: Baclofen, Clonidine, Gabapentin, Ketamine, Lidocaine   nystatin (MYCOSTATIN) 100000 UNIT/ML suspension Swish and swallow 5 mls po QID for 7 days    omeprazole (PRILOSEC) 20 MG capsule Take 1 capsule (20 mg total) by mouth daily as needed (acid reflux).    ondansetron (ZOFRAN) 8 MG tablet TAKE 1 TABLET EVERY 8 HOURS AS NEEDED FOR NAUSEA.     Oxycodone HCl 10 MG TABS Take 10 mg by mouth 4 (four) times daily as needed. 02/25/2022: Takes when the oxycodone /apap is not available.   oxyCODONE-acetaminophen (PERCOCET) 10-325 MG tablet Take 1 tablet by mouth every 6 (six) hours.    sucralfate (CARAFATE) 1 g tablet Take 1 tablet (1 g total) by mouth 4 (four) times daily -  with meals and at bedtime.    temazepam (RESTORIL) 30 MG capsule Take 1 capsule (30 mg total) by mouth at bedtime.    tiZANidine (ZANAFLEX) 4 MG tablet Take 4 mg by mouth every 8 (eight) hours as needed for muscle spasms.    traZODone (DESYREL) 50 MG tablet Take by mouth.    vitamin C (ASCORBIC ACID) 500 MG tablet Take 500 mg by mouth daily.    zinc gluconate 50 MG tablet Take 50 mg by mouth daily.    ziprasidone (GEODON) 80 MG capsule Take 1 capsule (80 mg total) by mouth at bedtime.    Facility-Administered Encounter Medications as of 10/01/2022  Medication   lidocaine (LIDODERM) 5 % 1 patch   Results for orders placed or performed in visit on 09/18/22  CBC with Differential/Platelet  Result Value Ref Range   WBC 11.4 (H) 4.0 - 10.5 K/uL   RBC 4.52 3.87 - 5.11 MIL/uL   Hemoglobin 14.5 12.0 -  15.0 g/dL   HCT 16.1 09.6 - 04.5 %   MCV 97.8 80.0 - 100.0 fL   MCH 32.1 26.0 - 34.0 pg   MCHC 32.8 30.0 - 36.0 g/dL   RDW 40.9 81.1 - 91.4 %   Platelets 376 150 - 400 K/uL   nRBC 0.0 0.0 - 0.2 %   Neutrophils Relative % 74 %   Neutro Abs 8.4 (H) 1.7 - 7.7 K/uL   Lymphocytes Relative 19 %   Lymphs Abs 2.2 0.7 - 4.0 K/uL   Monocytes Relative 5 %   Monocytes Absolute 0.6 0.1 - 1.0 K/uL   Eosinophils Relative 1 %   Eosinophils Absolute 0.1 0.0 - 0.5 K/uL   Basophils Relative 0 %   Basophils Absolute 0.0 0.0 - 0.1 K/uL   Immature Granulocytes 1 %   Abs Immature Granulocytes 0.06 0.00 - 0.07 K/uL      Cyst of ovary, unspecified laterality  Atypical mole - Plan: Ambulatory referral to Dermatology  Leukocytosis, unspecified type  Right shoulder pain, unspecified  chronicity - Plan: Ambulatory referral to Physical Therapy  Encounter for subsequent annual wellness visit (AWV) in Medicare patient She relates that she has multiple moles on her back and legs that she would like to have dermatology look at she requests a female dermatologist  She also states that she has developed right shoulder pain and discomfort over the course of the past several weeks hurts with certain movements raising forward putting her arm behind her back describes a burning pain and discomfort.  She has tried gentle range of motion cool compresses nothing seem to help so much currently  AWV- Annual Wellness Visit  The patient was seen for their annual wellness visit. The patient's past medical history, surgical history, and family history were reviewed. Pertinent vaccines were reviewed ( tetanus, pneumonia, shingles, flu) The patient's medication list was reviewed and updated.  The height and weight were entered.  BMI recorded in electronic record elsewhere  Cognitive screening was completed. Outcome of Mini - Cog: Passes   Falls /depression screening electronically recorded within record elsewhere  Current tobacco usage: None (All patients who use tobacco were given written and verbal information on quitting)  Recent listing of emergency department/hospitalizations over the past year were reviewed.  current specialist the patient sees on a regular basis: She does see hematologist on a regular basis, has also seen orthopedics and neurosurgery as well as gynecology   Medicare annual wellness visit patient questionnaire was reviewed.  A written screening schedule for the patient for the next 5-10 years was given. Appropriate discussion of followup regarding next visit was discussed.  She is up-to-date on her flu shot and COVID.  Her Tdap is due I encouraged her to get that through pharmacy  Patient states she needs for dermatology consult and having issues with right  shoulder.  Review of Systems     Objective:   Physical Exam  General-in no acute distress Eyes-no discharge Lungs-respiratory rate normal, CTA CV-no murmurs,RRR Extremities skin warm dry no edema Neuro grossly normal Behavior normal, alert       Assessment & Plan:  1. Cyst of ovary, unspecified laterality She will follow-up with gynecology regarding this nonemergent  2. Atypical mole Referral to dermatology - Ambulatory referral to Dermatology  3. Leukocytosis, unspecified type Persistent leukocytosis Will connect with hematology Patient uncertain when she needs to do her next lab work  4. Right shoulder pain, unspecified chronicity Physical therapy should help Try to avoid  steroid injection because of persistent leukocytosis and trying to figure that issue - Ambulatory referral to Physical Therapy  Annual wellness visit-medications reviewed immunizations reviewed patient being treated by her specialist for depression She does get counseling Patient is disabled

## 2022-10-02 ENCOUNTER — Encounter: Payer: Self-pay | Admitting: Family Medicine

## 2022-10-03 DIAGNOSIS — F331 Major depressive disorder, recurrent, moderate: Secondary | ICD-10-CM | POA: Diagnosis not present

## 2022-10-03 DIAGNOSIS — F900 Attention-deficit hyperactivity disorder, predominantly inattentive type: Secondary | ICD-10-CM | POA: Diagnosis not present

## 2022-10-03 DIAGNOSIS — F411 Generalized anxiety disorder: Secondary | ICD-10-CM | POA: Diagnosis not present

## 2022-10-06 DIAGNOSIS — F900 Attention-deficit hyperactivity disorder, predominantly inattentive type: Secondary | ICD-10-CM | POA: Diagnosis not present

## 2022-10-06 DIAGNOSIS — F331 Major depressive disorder, recurrent, moderate: Secondary | ICD-10-CM | POA: Diagnosis not present

## 2022-10-06 DIAGNOSIS — F411 Generalized anxiety disorder: Secondary | ICD-10-CM | POA: Diagnosis not present

## 2022-10-07 ENCOUNTER — Ambulatory Visit: Payer: PPO

## 2022-10-07 DIAGNOSIS — R293 Abnormal posture: Secondary | ICD-10-CM

## 2022-10-07 DIAGNOSIS — R279 Unspecified lack of coordination: Secondary | ICD-10-CM

## 2022-10-07 DIAGNOSIS — M25511 Pain in right shoulder: Secondary | ICD-10-CM

## 2022-10-07 DIAGNOSIS — M62838 Other muscle spasm: Secondary | ICD-10-CM

## 2022-10-07 DIAGNOSIS — M25552 Pain in left hip: Secondary | ICD-10-CM

## 2022-10-07 DIAGNOSIS — G8929 Other chronic pain: Secondary | ICD-10-CM

## 2022-10-07 DIAGNOSIS — M6281 Muscle weakness (generalized): Secondary | ICD-10-CM

## 2022-10-07 NOTE — Therapy (Signed)
OUTPATIENT PHYSICAL THERAPY TREATMENT NOTE   Patient Name: Katelyn [name] MRN: [id number] DOB:[birth year], 47 y.o., female Today's Date: 10/07/2022  PCP: Babs Sciara, MD  REFERRING PROVIDER: Despina Arias, MD  END OF SESSION:   PT End of Session - 10/07/22 0803     Visit Number 41    Date for PT Re-Evaluation 11/11/22    Authorization Type HTA    Authorization Time Period 2    Authorization - Visit Number 10    Progress Note Due on Visit 50    PT Start Time 0800    PT Stop Time 0840    PT Time Calculation (min) 40 min    Activity Tolerance Patient tolerated treatment well    Behavior During Therapy [facility] for tasks assessed/performed                    Past Medical History:  Diagnosis Date   Anemia    Anorexia    Anxiety    Asthma    controlled with meds   Benign juvenile melanoma    Chronic headaches    Colon polyps    found on colonoscopy 04/26/2012   Complication of anesthesia    itching after epidural for c section   Constipation    Depression    Depression    several suicide attempts, hospitaluzed in 2012 for this; hx pf ECT treatments ; pt sees Dr. Lolly Mustache pyschiatrist  and doing well on medication   Dysrhythmia    hx of PVC's (pt hasn't seen cardiologist since 2021- no follow up needed)   GERD (gastroesophageal reflux disease)    Headache    Heart murmur    as a child - no one mentions hearing murmur anymore, sometimes it is heard. Echo has been performed 02/03/18   Heartburn    no meds   History of pneumonia    x 3  years ago - no recent problems   Hx of blood clots    Left renal atrophy 12/19/2021   Neuromuscular disorder (HCC)    small fiber neuropathy   Obesity    Pneumonia    Polycystic ovary    takes metformin to treat   Renal atrophy, left 2023   Skin lesion    Excisional biopsy of moles - none cancerous   Sleep apnea    yrs ago - diagnosed mild sleep apnea - did not have to use cpap    Past Surgical History:  Procedure  Laterality Date   BREAST CAPSULECTOMY Bilateral 12/13/2020   1 week later pt had hematoma surgery on left breast   BREAST ENHANCEMENT SURGERY Bilateral 02/11/2017   BREATH TEK H PYLORI N/A 07/22/2013   Procedure: BREATH TEK H PYLORI;  Surgeon: Mariella Saa, MD;  Location: [facility];  Service: General;  Laterality: N/A;   CESAREAN SECTION  2004, 2007   x 2   COLONOSCOPY  2013   CYSTOSCOPY WITH URETHRAL DILATATION  age 54    GASTRIC ROUX-EN-Y N/A 10/31/2013   Procedure: LAPAROSCOPIC ROUX-EN-Y GASTRIC BYPASS WITH UPPER ENDOSCOPY;  Surgeon: Mariella Saa, MD;  Location: [facility];  Service: General;  Laterality: N/A;   GLUTEUS MINIMUS REPAIR Left 05/28/2021   Procedure: Left GLUTEUS Medius REPAIR;  Surgeon: Huel Cote, MD;  Location: [facility];  Service: Orthopedics;  Laterality: Left;   ingrown toenail Right    Ingrown nail on great right toe   kidney stent  08/28/2014   mole excision     "  benign juvenile melanoma" removed from left leg - inner thigh   POLYPECTOMY     2013   SKIN LESION EXCISION     back   TONSILLECTOMY  age 35   TUBAL LIGATION  2018   Patient Active Problem List   Diagnosis Date Noted   Left renal atrophy 12/19/2021   Stage 2 chronic kidney disease 11/20/2021   Meningitis 09/27/2021   Epigastric pain 09/27/2021   GERD (gastroesophageal reflux disease) 09/27/2021   Rash and nonspecific skin eruption 09/27/2021   Hypocalcemia 09/27/2021   Chronic pain syndrome 09/27/2021   Sepsis (HCC)    Fever 09/25/2021   Tear of left gluteus medius tendon    Asthma 07/18/2020   Vitamin B 12 deficiency 10/16/2019   Elevated LFTs 10/16/2019   Asthma, exogenous, unspecified asthma severity, uncomplicated 01/04/2019   B12 deficiency anemia 06/21/2018   Venous insufficiency 04/23/2018   Onychomycosis of toenail 04/17/2018   Chronic myofascial pain 03/18/2018   Small fiber neuropathy 03/18/2018   Idiopathic small fiber sensory neuropathy 02/23/2018   Varicose  veins of both lower extremities 01/13/2018   Cervical spondylosis without myelopathy 08/28/2017   Migraine with aura and without status migrainosus, not intractable 07/22/2017   Lumbar degenerative disc disease 07/20/2017   Spondylosis of lumbar spine 07/20/2017   Coccydynia 03/12/2017   Bariatric surgery status 05/02/2016   Major depressive disorder, recurrent episode, moderate (HCC) 02/18/2016   Tachy-brady syndrome (HCC) 12/19/2015   Iron deficiency anemia 06/10/2015   Sleep disorder 06/01/2013   Generalized anxiety disorder 05/30/2013   Chronic headaches 07/01/2012   Headache 07/01/2012   Depression, major, recurrent, severe with psychosis (HCC) 06/17/2012   Severe recurrent major depressive disorder with psychotic symptoms (HCC) 06/17/2012   Family history of colonic polyps 03/03/2012   Eating disorder 12/18/2011   Nausea & vomiting 01/22/2010   Polycystic ovarian syndrome 09/01/2008   Intermittent left-sided chest pain 08/02/2008    REFERRING DIAG: M25.511,G89.29,M25.512 (ICD-10-CM) - Chronic pain of both shoulders S76.012A (ICD-10-CM) - Tear of left gluteus medius tendon, initial encounter  THERAPY DIAG:  Abnormal posture  Muscle weakness (generalized)  Other muscle spasm  Unspecified lack of coordination  Pain in left hip  Chronic left shoulder pain  Acute pain of right shoulder  Rationale for Evaluation and Treatment Rehabilitation  PERTINENT HISTORY: Breast augmentation surgeries with follow-up reduction with complication, c/s, gastric bypass, bil tubal ligation, long history of chronic low back/pelvic pain, pudendal neuralgia, coccydynia, Lt glute med repair  PRECAUTIONS: NA  SUBJECTIVE:  SUBJECTIVE STATEMENT:  Pt reports good relief for several days after last session in Rt  shoulder, but pain has returned and is preventing  Problem list: -pudendal neuroalgia/intermittent labial pain -Bil hip and low back pain -being unable to sit -deconditioning -urinary urgency   PAIN:  Are you having pain? Yes, 7/10, Bil hip/low back   03/03/22 SUBJECTIVE STATEMENT: Pt had surgery for Lt glute med tear that most likely happened during fall 3 years ago. She has still had pain in Lt hip, but she is in rehab due to it. She had meningitis in June and ended up having Bil frozen shoulders. Her biggest complaint is of back pain, which impacts her bil shoulder pain because she has to lay on either side. She mostly lies on Rt side, which causes pain in Rt low back and Rt hip. She is rarely getting pressure off of buttocks. She continues to have sacral/coccydynia with sitting and avoids this position. She is not currently having vaginal/pelvic pain, but states this is coming and going - she manages by decreasing frequency of intercourse - no 1x/week. She is having issue with Lt kidney, but MD states that urinary issues are not related.  Is currently in PT for hip and shoulders - primary working on shoulders. Is only doing some basic leg strengthening and shoulder exercises at home.  Fluid intake: Yes: -     PAIN:  Are you having pain? Yes NPRS scale: 8/10Right and Left Pain location:    Pain type: sharp and pressure Pain description: constant    Aggravating factors: sitting, driving, finding comfortable position Relieving factors: lying on stomach relieves buttock pain if she can, but shoulders prevent this   PRECAUTIONS: None   WEIGHT BEARING RESTRICTIONS: No   FALLS:  Has patient fallen in last 6 months? No   LIVING ENVIRONMENT: Lives with: lives with their family Lives in: House/apartment   OCCUPATION: disabled   PLOF: Independent   PATIENT GOALS: would like to be able to sit; decrease bil hip/low back pain   PERTINENT HISTORY:  Breast augmentation surgeries  with follow-up reduction with complication, c/s, gastric bypass, bil tubal ligation, long history of chronic low back/pelvic pain, pudendal neuralgia, coccydynia, Lt glute med repair Sexual abuse: No   BOWEL MOVEMENT: Pain with bowel movement: No Type of bowel movement:Frequency 1x/day and Strain Yes Fully empty rectum: Yes: - Leakage: No Pads: No Fiber supplement: No no medication to help stimulate bowel movements   URINATION: Pain with urination: No Fully empty bladder: No Stream:  trouble starting the stream Urgency: no Frequency: every 30 minutes to an hour - worse once she does go, then she has to keep going - easier to just hold it Leakage:  none Pads: No   INTERCOURSE: Pain with intercourse: Initial Penetration and During Penetration Ability to have vaginal penetration:  Yes: pain - only tolerates 1x/week Climax: yes Marinoff Scale: 2/3   PREGNANCY: Vaginal deliveries 0 Tearing No C-section deliveries 2 Currently pregnant No       OBJECTIVE:  09/02/22: Bil trigger points and myofascial restriction in bil glutes/TFL/lumbar paraspinals  Squat: initially anterior weight translation and bil heel lift; made good corrections with cues and getting weight more posterior  Lumbar A/ROM: extension 20%, flexion 20%, lateral flexion bil 50%  06/23/22: Significant restriction in Rt hip with trigger points palpated Rt posterior/Lt anterior innominate rotation possible in pelvic  03/03/22: DIAGNOSTIC FINDINGS:  Lasix renogram with no abnormal findings   PATIENT SURVEYS:    PFIQ-7  100%   COGNITION: Overall cognitive status: Within functional limits for tasks assessed                          SENSATION: Light touch: Appears intact Proprioception: Appears intact   MUSCLE LENGTH:     FUNCTIONAL TESTS:              5xSTS: 29 seconds   GAIT:   Comments: decreased bil hip extension, posterior pelvic tilt   POSTURE: rounded shoulders, forward head, increased lumbar  lordosis, and posterior pelvic tilt   PELVIC ALIGNMENT:   LUMBARAROM/PROM:   A/PROM A/PROM  Eval (%)  Flexion 50, pain on Rt  Extension 20, no pain, very limited mobility  Right lateral flexion 50, pain in Rt low back  Left lateral flexion 75, pain in Rt low back  Right rotation 50, good stretch  Left rotation 50, good stretch   (Blank rows = not tested)     LOWER EXTREMITY MMT: tested in standing due to comfort             Rt: 3/5 hip abduction and extension, 4/5 adduction and flexion             Lt: 3/5 hip abduction, extension, flexion, 4/5 adduction     PALPATION:   General  tenderness throughout bil lumbar paraspinals and gluts, Lt>Rt; tenderness and spasm in thoracic paraspinals with decreased mobility upon deep breathing; scar tissue restriction under bil breasts that is tender; scar tissue restriction Lt glut med surgical incision                 External Perineal Exam NA                             Internal Pelvic Floor NA   Patient confirms identification and approves PT to assess internal pelvic floor and treatment Yes for future need   PELVIC MMT:   MMT eval  Vaginal    Internal Anal Sphincter    External Anal Sphincter    Puborectalis    Diastasis Recti    (Blank rows = not tested)         TONE: NA   PROLAPSE: NA   TODAY'S TREATMENT  10/07/22 Manual: Soft tissue mobilization Rt lumbar paraspinals Rt posterolateral hip muscles Rt thoracic paraspinals Rt scapular muscles Exercises: Standing lateral side bend with doorway support 6 min Clam shells 10x Scapular retraction 10x  09/30/22 Manual: Soft tissue mobilization Bil lumbar paraspinals Bil posterolateral hip muscles Rt thoracic paraspinals Rt scapular muscles Neuromuscular re-education: Serratus protraction/retraction on wall Lat isometric with shoulder depression    09/25/22 Manual: Soft tissue mobilization Bil lumbar paraspinals Bil posterolateral hip muscles Rt thoracic  paraspinals Rt scapular muscles Neuromuscular re-education: Serratus protraction/retraction on wall Lat isometric with shoulder depression   PATIENT EDUCATION:  Education details: see above self-care Person educated: Patient Education method: Explanation, Demonstration, Tactile cues, Verbal cues, and Handouts Education comprehension: verbalized understanding   HOME EXERCISE PROGRAM: Written handout   ASSESSMENT:   CLINICAL IMPRESSION: Manual techniques were helpful last session and repeated today with focus on Rt rhomboids; she is still uncomfortable with idea of dry needling to these muscles at this time. Focus placed on thoracolumbar fascial release with excellent tolerance. Stretches provided to further facilitate this release. She was encouraged to continue working on hip strengthening and not using raised toilet seat. She will continue to benefit  from skilled PT intervention in order to decrease pain, improve functional ability, increase ease of starting stream/emptying bladder, and improve QOL.    OBJECTIVE IMPAIRMENTS: decreased activity tolerance, decreased coordination, decreased endurance, decreased mobility, decreased ROM, decreased strength, hypomobility, increased fascial restrictions, increased muscle spasms, impaired flexibility, impaired tone, postural dysfunction, and pain.    ACTIVITY LIMITATIONS: bending, sitting, standing, stairs, transfers, bed mobility, and locomotion level   PARTICIPATION LIMITATIONS: cleaning, laundry, interpersonal relationship, driving, and community activity   PERSONAL FACTORS: 3+ comorbidities: Breast augmentation surgeries with follow-up reduction with complication, c/s, gastric bypass, bil tubal ligation, long history of chronic low back/pelvic pain, pudendal neuralgia, coccydynia, Lt glut med repair  are also affecting patient's functional outcome.    REHAB POTENTIAL: Good   CLINICAL DECISION MAKING: Stable/uncomplicated   EVALUATION  COMPLEXITY: Low     GOALS: Goals reviewed with patient? Yes   SHORT TERM GOALS: Target date: 03/31/2022 - updated 03/26/22 updated 05/14/22 - updated 06/16/22 - updated 2/26 new target 09/01/2022 - updated 08/21/22 - updated 09/01/22 with new goal 11/11/2022 -c updated 09/30/22   Pt will be independent with HEP.    Baseline: Goal status: MET 03/26/22   2.  Pt will be independent with diaphragmatic breathing and down training activities in order to improve pelvic floor relaxation and abdominal wall mobility.   Baseline:  Goal status: MET 05/14/22   3.  Pt will report improve stream initiation and complete voiding with urination in order to improve QOL. Baseline:  Goal status: IN PROGRESS              4.  Pt will increase all impaired lumbar A/ROM by 25% without pain.   Baseline:  Goal status: IN PROGRESS   LONG TERM GOALS: Target date: 04/28/2022 - updated 03/26/22 - updated 05/14/22 - updated 06/16/22 - updated 06/23/22 new target 09/01/2022 - updated 08/21/22 - updated 09/01/22 with new goal 11/11/2022 - updated 09/30/22   Pt will be independent with advanced HEP.    Baseline:  Goal status:IN PROGRESS   2.  Pt will be able to use regular toilet without difficulty. Baseline: She has returned to raised toilet seat 100% of the time Goal status: IN PROGRESS   3.  Pt will be able to ascend/descend 12 steps at regular intervals throughout the day safely and with minimal difficulty.  Baseline: Pt has not been working on steps due to increase in low back pain - she has been trying to go up 1x/day but it has been more difficult Goal status: IN PROGRESS   4.  Pt will improve 5xSTS to less than 20 seconds and improved PFIQ by 25% in order to demonstrate improved QOL and functional ability.  Baseline: 28 seconds Goal status: IN PROGRESS   5.  Patient will report no greater than 5/10 pain with sitting in order to more easily attend appointments, drive, and spend time with family.  Baseline:  Goal  status:IN PROGRESS   6.  Pt will demonstrate increase in all impaired hip strength by 1 muscle grades in order to demonstrate improved lumbopelvic support and increase functional ability.      Baseline:  Goal status: IN PROGRESS   PLAN:   PT FREQUENCY: 1x/week   PT DURATION: 10 weeks   PLANNED INTERVENTIONS: Therapeutic exercises, Therapeutic activity, Neuromuscular re-education, Balance training, Gait training, Patient/Family education, Self Care, Joint mobilization, Aquatic Therapy, Dry Needling, Biofeedback, and Manual therapy   PLAN FOR NEXT SESSION: DN to Rt rhomboids; manual techniques as needed;  scapular exercises/mobility to improve wiping capability.    Julio Alm, PT, DPT06/11/248:43 AM

## 2022-10-15 DIAGNOSIS — F900 Attention-deficit hyperactivity disorder, predominantly inattentive type: Secondary | ICD-10-CM | POA: Diagnosis not present

## 2022-10-15 DIAGNOSIS — F331 Major depressive disorder, recurrent, moderate: Secondary | ICD-10-CM | POA: Diagnosis not present

## 2022-10-15 DIAGNOSIS — F411 Generalized anxiety disorder: Secondary | ICD-10-CM | POA: Diagnosis not present

## 2022-10-16 ENCOUNTER — Encounter: Payer: Self-pay | Admitting: Family Medicine

## 2022-10-16 ENCOUNTER — Ambulatory Visit: Payer: PPO

## 2022-10-16 DIAGNOSIS — M62838 Other muscle spasm: Secondary | ICD-10-CM

## 2022-10-16 DIAGNOSIS — M6281 Muscle weakness (generalized): Secondary | ICD-10-CM

## 2022-10-16 DIAGNOSIS — R293 Abnormal posture: Secondary | ICD-10-CM | POA: Diagnosis not present

## 2022-10-16 DIAGNOSIS — R279 Unspecified lack of coordination: Secondary | ICD-10-CM

## 2022-10-16 DIAGNOSIS — M25552 Pain in left hip: Secondary | ICD-10-CM

## 2022-10-16 NOTE — Patient Instructions (Signed)
Exercises 6/20 Standing ball presses for core activation 12x Standing UE side press for core activation 10x bil Standing ball roll outs 12x Standing lateral body stretch 3 min bil Supported standing over peanut leg extension 10x bil Standing adductor stretch 2 min bil Standing hip flexor stretch 2 min bil Lunge rocking on 6 inch step 10x bil Sidestepping 2 x 10 bil

## 2022-10-16 NOTE — Therapy (Signed)
OUTPATIENT PHYSICAL THERAPY TREATMENT NOTE   Patient Name: Katelyn [name] MRN: [id number] DOB:1976/04/26, 47 y.o., female Today's Date: 10/16/2022  PCP: Babs Sciara, MD  REFERRING PROVIDER: Despina Arias, MD  END OF SESSION:   PT End of Session - 10/16/22 0931     Visit Number 42    Date for PT Re-Evaluation 11/11/22    Authorization Type HTA    Authorization Time Period 3    Authorization - Visit Number 10    Progress Note Due on Visit 50    PT Start Time 0930    PT Stop Time 1010    PT Time Calculation (min) 40 min    Activity Tolerance Patient tolerated treatment well    Behavior During Therapy [facility] for tasks assessed/performed                    Past Medical History:  Diagnosis Date   Anemia    Anorexia    Anxiety    Asthma    controlled with meds   Benign juvenile melanoma    Chronic headaches    Colon polyps    found on colonoscopy 04/26/2012   Complication of anesthesia    itching after epidural for c section   Constipation    Depression    Depression    several suicide attempts, hospitaluzed in 2012 for this; hx pf ECT treatments ; pt sees Dr. Lolly Mustache pyschiatrist  and doing well on medication   Dysrhythmia    hx of PVC's (pt hasn't seen cardiologist since 2021- no follow up needed)   GERD (gastroesophageal reflux disease)    Headache    Heart murmur    as a child - no one mentions hearing murmur anymore, sometimes it is heard. Echo has been performed 02/03/18   Heartburn    no meds   History of pneumonia    x 3  years ago - no recent problems   Hx of blood clots    Left renal atrophy 12/19/2021   Neuromuscular disorder (HCC)    small fiber neuropathy   Obesity    Pneumonia    Polycystic ovary    takes metformin to treat   Renal atrophy, left 2023   Skin lesion    Excisional biopsy of moles - none cancerous   Sleep apnea    yrs ago - diagnosed mild sleep apnea - did not have to use cpap    Past Surgical History:  Procedure  Laterality Date   BREAST CAPSULECTOMY Bilateral 12/13/2020   1 week later pt had hematoma surgery on left breast   BREAST ENHANCEMENT SURGERY Bilateral 02/11/2017   BREATH TEK H PYLORI N/A 07/22/2013   Procedure: BREATH TEK H PYLORI;  Surgeon: Mariella Saa, MD;  Location: [facility];  Service: General;  Laterality: N/A;   CESAREAN SECTION  2004, 2007   x 2   COLONOSCOPY  2013   CYSTOSCOPY WITH URETHRAL DILATATION  age 39    GASTRIC ROUX-EN-Y N/A 10/31/2013   Procedure: LAPAROSCOPIC ROUX-EN-Y GASTRIC BYPASS WITH UPPER ENDOSCOPY;  Surgeon: Mariella Saa, MD;  Location: [facility];  Service: General;  Laterality: N/A;   GLUTEUS MINIMUS REPAIR Left 05/28/2021   Procedure: Left GLUTEUS Medius REPAIR;  Surgeon: Huel Cote, MD;  Location: [facility];  Service: Orthopedics;  Laterality: Left;   ingrown toenail Right    Ingrown nail on great right toe   kidney stent  08/28/2014   mole excision     "  benign juvenile melanoma" removed from left leg - inner thigh   POLYPECTOMY     2013   SKIN LESION EXCISION     back   TONSILLECTOMY  age 10   TUBAL LIGATION  2018   Patient Active Problem List   Diagnosis Date Noted   Panic disorder (episodic paroxysmal anxiety) 07/10/2022   Major depressive disorder, recurrent, moderate (HCC) 07/10/2022   Left renal atrophy 12/19/2021   Stage 2 chronic kidney disease 11/20/2021   Meningitis 09/27/2021   Epigastric pain 09/27/2021   GERD (gastroesophageal reflux disease) 09/27/2021   Rash and nonspecific skin eruption 09/27/2021   Hypocalcemia 09/27/2021   Chronic pain syndrome 09/27/2021   Sepsis (HCC)    Fever 09/25/2021   Tear of left gluteus medius tendon    Asthma 07/18/2020   Vitamin B 12 deficiency 10/16/2019   Elevated LFTs 10/16/2019   Asthma, exogenous, unspecified asthma severity, uncomplicated 01/04/2019   B12 deficiency anemia 06/21/2018   Venous insufficiency 04/23/2018   Onychomycosis of toenail 04/17/2018   Chronic  myofascial pain 03/18/2018   Small fiber neuropathy 03/18/2018   Idiopathic small fiber sensory neuropathy 02/23/2018   Varicose veins of both lower extremities 01/13/2018   Cervical spondylosis without myelopathy 08/28/2017   Migraine with aura and without status migrainosus, not intractable 07/22/2017   Lumbar degenerative disc disease 07/20/2017   Spondylosis of lumbar spine 07/20/2017   Coccydynia 03/12/2017   Bariatric surgery status 05/02/2016   Major depressive disorder, recurrent episode, moderate (HCC) 02/18/2016   Tachy-brady syndrome (HCC) 12/19/2015   Iron deficiency anemia 06/10/2015   Sleep disorder 06/01/2013   Generalized anxiety disorder 05/30/2013   Chronic headaches 07/01/2012   Headache 07/01/2012   Depression, major, recurrent, severe with psychosis (HCC) 06/17/2012   Severe recurrent major depressive disorder with psychotic symptoms (HCC) 06/17/2012   Family history of colonic polyps 03/03/2012   Eating disorder 12/18/2011   Constipation 01/20/2011   Nausea & vomiting 01/22/2010   Polycystic ovarian syndrome 09/01/2008   Intermittent left-sided chest pain 08/02/2008    REFERRING DIAG: M25.511,G89.29,M25.512 (ICD-10-CM) - Chronic pain of both shoulders S76.012A (ICD-10-CM) - Tear of left gluteus medius tendon, initial encounter  THERAPY DIAG:  Abnormal posture  Muscle weakness (generalized)  Other muscle spasm  Unspecified lack of coordination  Pain in left hip  Rationale for Evaluation and Treatment Rehabilitation  PERTINENT HISTORY: Breast augmentation surgeries with follow-up reduction with complication, c/s, gastric bypass, bil tubal ligation, long history of chronic low back/pelvic pain, pudendal neuralgia, coccydynia, Lt glute med repair  PRECAUTIONS: NA  SUBJECTIVE:  SUBJECTIVE STATEMENT:  Pt having more severe low back and bil hip pain. She feels like constantly lying on her hips is too much pressure. She cannot bend even small amount due to it flaring up low back pain. She is having a lot of tailbone with sitting - pain is worse once she is done with sitting and is standing up. She states that she can only bend over once and she is done for the day due to increase in pain.   Problem list: -pudendal neuroalgia/intermittent labial pain -Bil hip and low back pain -being unable to sit -deconditioning -urinary urgency   PAIN:  Are you having pain? Yes, 9/10, Bil hip/low back   03/03/22 SUBJECTIVE STATEMENT: Pt had surgery for Lt glute med tear that most likely happened during fall 3 years ago. She has still had pain in Lt hip, but she is in rehab due to it. She had meningitis in June and ended up having Bil frozen shoulders. Her biggest complaint is of back pain, which impacts her bil shoulder pain because she has to lay on either side. She mostly lies on Rt side, which causes pain in Rt low back and Rt hip. She is rarely getting pressure off of buttocks. She continues to have sacral/coccydynia with sitting and avoids this position. She is not currently having vaginal/pelvic pain, but states this is coming and going - she manages by decreasing frequency of intercourse - no 1x/week. She is having issue with Lt kidney, but MD states that urinary issues are not related.  Is currently in PT for hip and shoulders - primary working on shoulders. Is only doing some basic leg strengthening and shoulder exercises at home.  Fluid intake: Yes: -     PAIN:  Are you having pain? Yes NPRS scale: 8/10Right and Left Pain location:    Pain type: sharp and pressure Pain description: constant    Aggravating factors: sitting, driving, finding comfortable position Relieving factors: lying on stomach relieves buttock pain if she can, but shoulders prevent this    PRECAUTIONS: None   WEIGHT BEARING RESTRICTIONS: No   FALLS:  Has patient fallen in last 6 months? No   LIVING ENVIRONMENT: Lives with: lives with their family Lives in: House/apartment   OCCUPATION: disabled   PLOF: Independent   PATIENT GOALS: would like to be able to sit; decrease bil hip/low back pain   PERTINENT HISTORY:  Breast augmentation surgeries with follow-up reduction with complication, c/s, gastric bypass, bil tubal ligation, long history of chronic low back/pelvic pain, pudendal neuralgia, coccydynia, Lt glute med repair Sexual abuse: No   BOWEL MOVEMENT: Pain with bowel movement: No Type of bowel movement:Frequency 1x/day and Strain Yes Fully empty rectum: Yes: - Leakage: No Pads: No Fiber supplement: No no medication to help stimulate bowel movements   URINATION: Pain with urination: No Fully empty bladder: No Stream:  trouble starting the stream Urgency: no Frequency: every 30 minutes to an hour - worse once she does go, then she has to keep going - easier to just hold it Leakage:  none Pads: No   INTERCOURSE: Pain with intercourse: Initial Penetration and During Penetration Ability to have vaginal penetration:  Yes: pain - only tolerates 1x/week Climax: yes Marinoff Scale: 2/3   PREGNANCY: Vaginal deliveries 0 Tearing No C-section deliveries 2 Currently pregnant No       OBJECTIVE:  09/02/22: Bil trigger points and myofascial restriction in bil glutes/TFL/lumbar paraspinals  Squat: initially anterior weight translation and bil heel  lift; made good corrections with cues and getting weight more posterior  Lumbar A/ROM: extension 20%, flexion 20%, lateral flexion bil 50%  06/23/22: Significant restriction in Rt hip with trigger points palpated Rt posterior/Lt anterior innominate rotation possible in pelvic  03/03/22: DIAGNOSTIC FINDINGS:  Lasix renogram with no abnormal findings   PATIENT SURVEYS:    PFIQ-7 100%    COGNITION: Overall cognitive status: Within functional limits for tasks assessed                          SENSATION: Light touch: Appears intact Proprioception: Appears intact   MUSCLE LENGTH:     FUNCTIONAL TESTS:              5xSTS: 29 seconds   GAIT:   Comments: decreased bil hip extension, posterior pelvic tilt   POSTURE: rounded shoulders, forward head, increased lumbar lordosis, and posterior pelvic tilt   PELVIC ALIGNMENT:   LUMBARAROM/PROM:   A/PROM A/PROM  Eval (%)  Flexion 50, pain on Rt  Extension 20, no pain, very limited mobility  Right lateral flexion 50, pain in Rt low back  Left lateral flexion 75, pain in Rt low back  Right rotation 50, good stretch  Left rotation 50, good stretch   (Blank rows = not tested)     LOWER EXTREMITY MMT: tested in standing due to comfort             Rt: 3/5 hip abduction and extension, 4/5 adduction and flexion             Lt: 3/5 hip abduction, extension, flexion, 4/5 adduction     PALPATION:   General  tenderness throughout bil lumbar paraspinals and gluts, Lt>Rt; tenderness and spasm in thoracic paraspinals with decreased mobility upon deep breathing; scar tissue restriction under bil breasts that is tender; scar tissue restriction Lt glut med surgical incision                 External Perineal Exam NA                             Internal Pelvic Floor NA   Patient confirms identification and approves PT to assess internal pelvic floor and treatment Yes for future need   PELVIC MMT:   MMT eval  Vaginal    Internal Anal Sphincter    External Anal Sphincter    Puborectalis    Diastasis Recti    (Blank rows = not tested)         TONE: NA   PROLAPSE: NA   TODAY'S TREATMENT  10/16/22 Manual:  Neuromuscular re-education: Transversus abdominus training with multimodal cues for improved motor control and breath coordination Standing ball presses for core activation 12x Standing UE side press for core  activation 10x bil Exercises: Standing ball roll outs 12x Standing lateral body stretch 3 min bil Supported standing over peanut leg extension 10x bil Standing adductor stretch 2 min bil Standing hip flexor stretch 2 min bil Lunge rocking on 6 inch step 10x bil Sidestepping 2 x 10 bil    10/07/22 Manual: Soft tissue mobilization Rt lumbar paraspinals Rt posterolateral hip muscles Rt thoracic paraspinals Rt scapular muscles Exercises: Standing lateral side bend with doorway support 6 min Clam shells 10x Scapular retraction 10x  09/30/22 Manual: Soft tissue mobilization Bil lumbar paraspinals Bil posterolateral hip muscles Rt thoracic paraspinals Rt scapular muscles Neuromuscular re-education: Serratus protraction/retraction on  wall Lat isometric with shoulder depression   PATIENT EDUCATION:  Education details: see above self-care Person educated: Patient Education method: Explanation, Demonstration, Tactile cues, Verbal cues, and Handouts Education comprehension: verbalized understanding   HOME EXERCISE PROGRAM: Written handout   ASSESSMENT:   CLINICAL IMPRESSION: Due to limited ability to make herself do exercises at home and very little benefit from manual techniques each week, focus placed on mobility and gentle strengthening exercises this session. Pt tolerated well with no sharp increases in pain; she is concerned about how much she will feel exercises later. Believe that increasing deep core motor control will help with lumbar and pelvic stability. She was strongly encouraged to use at least core activation exercises in order to help decrease pain when she is in severe flare ups; she is agreeable to try. She will continue to benefit from skilled PT intervention in order to decrease pain, improve functional ability, increase ease of starting stream/emptying bladder, and improve QOL.    OBJECTIVE IMPAIRMENTS: decreased activity tolerance, decreased coordination,  decreased endurance, decreased mobility, decreased ROM, decreased strength, hypomobility, increased fascial restrictions, increased muscle spasms, impaired flexibility, impaired tone, postural dysfunction, and pain.    ACTIVITY LIMITATIONS: bending, sitting, standing, stairs, transfers, bed mobility, and locomotion level   PARTICIPATION LIMITATIONS: cleaning, laundry, interpersonal relationship, driving, and community activity   PERSONAL FACTORS: 3+ comorbidities: Breast augmentation surgeries with follow-up reduction with complication, c/s, gastric bypass, bil tubal ligation, long history of chronic low back/pelvic pain, pudendal neuralgia, coccydynia, Lt glut med repair  are also affecting patient's functional outcome.    REHAB POTENTIAL: Good   CLINICAL DECISION MAKING: Stable/uncomplicated   EVALUATION COMPLEXITY: Low     GOALS: Goals reviewed with patient? Yes   SHORT TERM GOALS: Target date: 03/31/2022 - updated 03/26/22 updated 05/14/22 - updated 06/16/22 - updated 2/26 new target 09/01/2022 - updated 08/21/22 - updated 09/01/22 with new goal 11/11/2022 -c updated 09/30/22   Pt will be independent with HEP.    Baseline: Goal status: MET 03/26/22   2.  Pt will be independent with diaphragmatic breathing and down training activities in order to improve pelvic floor relaxation and abdominal wall mobility.   Baseline:  Goal status: MET 05/14/22   3.  Pt will report improve stream initiation and complete voiding with urination in order to improve QOL. Baseline:  Goal status: IN PROGRESS              4.  Pt will increase all impaired lumbar A/ROM by 25% without pain.   Baseline:  Goal status: IN PROGRESS   LONG TERM GOALS: Target date: 04/28/2022 - updated 03/26/22 - updated 05/14/22 - updated 06/16/22 - updated 06/23/22 new target 09/01/2022 - updated 08/21/22 - updated 09/01/22 with new goal 11/11/2022 - updated 09/30/22   Pt will be independent with advanced HEP.    Baseline:  Goal status:IN  PROGRESS   2.  Pt will be able to use regular toilet without difficulty. Baseline: She has returned to raised toilet seat 100% of the time Goal status: IN PROGRESS   3.  Pt will be able to ascend/descend 12 steps at regular intervals throughout the day safely and with minimal difficulty.  Baseline: Pt has not been working on steps due to increase in low back pain - she has been trying to go up 1x/day but it has been more difficult Goal status: IN PROGRESS   4.  Pt will improve 5xSTS to less than 20 seconds and improved PFIQ by 25%  in order to demonstrate improved QOL and functional ability.  Baseline: 28 seconds Goal status: IN PROGRESS   5.  Patient will report no greater than 5/10 pain with sitting in order to more easily attend appointments, drive, and spend time with family.  Baseline:  Goal status:IN PROGRESS   6.  Pt will demonstrate increase in all impaired hip strength by 1 muscle grades in order to demonstrate improved lumbopelvic support and increase functional ability.      Baseline:  Goal status: IN PROGRESS   PLAN:   PT FREQUENCY: 1x/week   PT DURATION: 10 weeks   PLANNED INTERVENTIONS: Therapeutic exercises, Therapeutic activity, Neuromuscular re-education, Balance training, Gait training, Patient/Family education, Self Care, Joint mobilization, Aquatic Therapy, Dry Needling, Biofeedback, and Manual therapy   PLAN FOR NEXT SESSION: Continue focus on mobility and strengthening to tolerance.    Julio Alm, PT, DPT06/20/2410:13 AM

## 2022-10-20 ENCOUNTER — Inpatient Hospital Stay: Payer: PPO | Attending: Physician Assistant

## 2022-10-20 DIAGNOSIS — D72829 Elevated white blood cell count, unspecified: Secondary | ICD-10-CM | POA: Diagnosis not present

## 2022-10-20 DIAGNOSIS — D7589 Other specified diseases of blood and blood-forming organs: Secondary | ICD-10-CM

## 2022-10-20 LAB — CBC WITH DIFFERENTIAL/PLATELET
Abs Immature Granulocytes: 0.06 10*3/uL (ref 0.00–0.07)
Basophils Absolute: 0.1 10*3/uL (ref 0.0–0.1)
Basophils Relative: 0 %
Eosinophils Absolute: 0.1 10*3/uL (ref 0.0–0.5)
Eosinophils Relative: 1 %
HCT: 43.3 % (ref 36.0–46.0)
Hemoglobin: 14 g/dL (ref 12.0–15.0)
Immature Granulocytes: 1 %
Lymphocytes Relative: 18 %
Lymphs Abs: 2.1 10*3/uL (ref 0.7–4.0)
MCH: 31.7 pg (ref 26.0–34.0)
MCHC: 32.3 g/dL (ref 30.0–36.0)
MCV: 98 fL (ref 80.0–100.0)
Monocytes Absolute: 0.6 10*3/uL (ref 0.1–1.0)
Monocytes Relative: 5 %
Neutro Abs: 9.3 10*3/uL — ABNORMAL HIGH (ref 1.7–7.7)
Neutrophils Relative %: 75 %
Platelets: 359 10*3/uL (ref 150–400)
RBC: 4.42 MIL/uL (ref 3.87–5.11)
RDW: 13.2 % (ref 11.5–15.5)
WBC: 12.2 10*3/uL — ABNORMAL HIGH (ref 4.0–10.5)
nRBC: 0 % (ref 0.0–0.2)

## 2022-10-21 DIAGNOSIS — F900 Attention-deficit hyperactivity disorder, predominantly inattentive type: Secondary | ICD-10-CM | POA: Diagnosis not present

## 2022-10-21 DIAGNOSIS — F331 Major depressive disorder, recurrent, moderate: Secondary | ICD-10-CM | POA: Diagnosis not present

## 2022-10-21 DIAGNOSIS — F411 Generalized anxiety disorder: Secondary | ICD-10-CM | POA: Diagnosis not present

## 2022-10-22 ENCOUNTER — Ambulatory Visit: Payer: PPO

## 2022-10-22 ENCOUNTER — Encounter: Payer: Self-pay | Admitting: *Deleted

## 2022-10-22 DIAGNOSIS — R279 Unspecified lack of coordination: Secondary | ICD-10-CM

## 2022-10-22 DIAGNOSIS — R293 Abnormal posture: Secondary | ICD-10-CM

## 2022-10-22 DIAGNOSIS — M62838 Other muscle spasm: Secondary | ICD-10-CM

## 2022-10-22 DIAGNOSIS — M25552 Pain in left hip: Secondary | ICD-10-CM

## 2022-10-22 DIAGNOSIS — M6281 Muscle weakness (generalized): Secondary | ICD-10-CM

## 2022-10-22 NOTE — Therapy (Signed)
OUTPATIENT PHYSICAL THERAPY TREATMENT NOTE   Patient Name: Katelyn [name] MRN: [id number] DOB:Feb 01, 1976, 47 y.o., female Today's Date: 10/22/2022  PCP: Babs Sciara, MD  REFERRING PROVIDER: Despina Arias, MD  END OF SESSION:   PT End of Session - 10/22/22 0757     Visit Number 43    Date for PT Re-Evaluation 11/11/22    Authorization Type HTA    Authorization Time Period 4    Authorization - Visit Number 10    Progress Note Due on Visit 50    PT Start Time 0800    PT Stop Time 0840    PT Time Calculation (min) 40 min    Activity Tolerance Patient tolerated treatment well    Behavior During Therapy [facility] for tasks assessed/performed                    Past Medical History:  Diagnosis Date   Anemia    Anorexia    Anxiety    Asthma    controlled with meds   Benign juvenile melanoma    Chronic headaches    Colon polyps    found on colonoscopy 04/26/2012   Complication of anesthesia    itching after epidural for c section   Constipation    Depression    Depression    several suicide attempts, hospitaluzed in 2012 for this; hx pf ECT treatments ; pt sees Dr. Lolly Mustache pyschiatrist  and doing well on medication   Dysrhythmia    hx of PVC's (pt hasn't seen cardiologist since 2021- no follow up needed)   GERD (gastroesophageal reflux disease)    Headache    Heart murmur    as a child - no one mentions hearing murmur anymore, sometimes it is heard. Echo has been performed 02/03/18   Heartburn    no meds   History of pneumonia    x 3  years ago - no recent problems   Hx of blood clots    Left renal atrophy 12/19/2021   Neuromuscular disorder (HCC)    small fiber neuropathy   Obesity    Pneumonia    Polycystic ovary    takes metformin to treat   Renal atrophy, left 2023   Skin lesion    Excisional biopsy of moles - none cancerous   Sleep apnea    yrs ago - diagnosed mild sleep apnea - did not have to use cpap    Past Surgical History:  Procedure  Laterality Date   BREAST CAPSULECTOMY Bilateral 12/13/2020   1 week later pt had hematoma surgery on left breast   BREAST ENHANCEMENT SURGERY Bilateral 02/11/2017   BREATH TEK H PYLORI N/A 07/22/2013   Procedure: BREATH TEK H PYLORI;  Surgeon: Mariella Saa, MD;  Location: [facility];  Service: General;  Laterality: N/A;   CESAREAN SECTION  2004, 2007   x 2   COLONOSCOPY  2013   CYSTOSCOPY WITH URETHRAL DILATATION  age 87    GASTRIC ROUX-EN-Y N/A 10/31/2013   Procedure: LAPAROSCOPIC ROUX-EN-Y GASTRIC BYPASS WITH UPPER ENDOSCOPY;  Surgeon: Mariella Saa, MD;  Location: [facility];  Service: General;  Laterality: N/A;   GLUTEUS MINIMUS REPAIR Left 05/28/2021   Procedure: Left GLUTEUS Medius REPAIR;  Surgeon: Huel Cote, MD;  Location: [facility];  Service: Orthopedics;  Laterality: Left;   ingrown toenail Right    Ingrown nail on great right toe   kidney stent  08/28/2014   mole excision     "  benign juvenile melanoma" removed from left leg - inner thigh   POLYPECTOMY     2013   SKIN LESION EXCISION     back   TONSILLECTOMY  age 10   TUBAL LIGATION  2018   Patient Active Problem List   Diagnosis Date Noted   Panic disorder (episodic paroxysmal anxiety) 07/10/2022   Major depressive disorder, recurrent, moderate (HCC) 07/10/2022   Left renal atrophy 12/19/2021   Stage 2 chronic kidney disease 11/20/2021   Meningitis 09/27/2021   Epigastric pain 09/27/2021   GERD (gastroesophageal reflux disease) 09/27/2021   Rash and nonspecific skin eruption 09/27/2021   Hypocalcemia 09/27/2021   Chronic pain syndrome 09/27/2021   Sepsis (HCC)    Fever 09/25/2021   Tear of left gluteus medius tendon    Asthma 07/18/2020   Vitamin B 12 deficiency 10/16/2019   Elevated LFTs 10/16/2019   Asthma, exogenous, unspecified asthma severity, uncomplicated 01/04/2019   B12 deficiency anemia 06/21/2018   Venous insufficiency 04/23/2018   Onychomycosis of toenail 04/17/2018   Chronic  myofascial pain 03/18/2018   Small fiber neuropathy 03/18/2018   Idiopathic small fiber sensory neuropathy 02/23/2018   Varicose veins of both lower extremities 01/13/2018   Cervical spondylosis without myelopathy 08/28/2017   Migraine with aura and without status migrainosus, not intractable 07/22/2017   Lumbar degenerative disc disease 07/20/2017   Spondylosis of lumbar spine 07/20/2017   Coccydynia 03/12/2017   Bariatric surgery status 05/02/2016   Major depressive disorder, recurrent episode, moderate (HCC) 02/18/2016   Tachy-brady syndrome (HCC) 12/19/2015   Iron deficiency anemia 06/10/2015   Sleep disorder 06/01/2013   Generalized anxiety disorder 05/30/2013   Chronic headaches 07/01/2012   Headache 07/01/2012   Depression, major, recurrent, severe with psychosis (HCC) 06/17/2012   Severe recurrent major depressive disorder with psychotic symptoms (HCC) 06/17/2012   Family history of colonic polyps 03/03/2012   Eating disorder 12/18/2011   Constipation 01/20/2011   Nausea & vomiting 01/22/2010   Polycystic ovarian syndrome 09/01/2008   Intermittent left-sided chest pain 08/02/2008    REFERRING DIAG: M25.511,G89.29,M25.512 (ICD-10-CM) - Chronic pain of both shoulders S76.012A (ICD-10-CM) - Tear of left gluteus medius tendon, initial encounter  THERAPY DIAG:  Abnormal posture  Muscle weakness (generalized)  Other muscle spasm  Unspecified lack of coordination  Pain in left hip  Rationale for Evaluation and Treatment Rehabilitation  PERTINENT HISTORY: Breast augmentation surgeries with follow-up reduction with complication, c/s, gastric bypass, bil tubal ligation, long history of chronic low back/pelvic pain, pudendal neuralgia, coccydynia, Lt glute med repair  PRECAUTIONS: NA  SUBJECTIVE:  SUBJECTIVE STATEMENT:  Pt states that she is doing well with increase in exercises. She feels sore, but in a good way. She is having a little more Rt lower quadrant pain, but states that it may be due to starting menstrual cycle next week. She does state that she is having more anterior/lateral hip pain.   Problem list: -pudendal neuroalgia/intermittent labial pain -Bil hip and low back pain -being unable to sit -deconditioning -urinary urgency   PAIN:  Are you having pain? Yes, 8/10, Bil hip/low back   03/03/22 SUBJECTIVE STATEMENT: Pt had surgery for Lt glute med tear that most likely happened during fall 3 years ago. She has still had pain in Lt hip, but she is in rehab due to it. She had meningitis in June and ended up having Bil frozen shoulders. Her biggest complaint is of back pain, which impacts her bil shoulder pain because she has to lay on either side. She mostly lies on Rt side, which causes pain in Rt low back and Rt hip. She is rarely getting pressure off of buttocks. She continues to have sacral/coccydynia with sitting and avoids this position. She is not currently having vaginal/pelvic pain, but states this is coming and going - she manages by decreasing frequency of intercourse - no 1x/week. She is having issue with Lt kidney, but MD states that urinary issues are not related.  Is currently in PT for hip and shoulders - primary working on shoulders. Is only doing some basic leg strengthening and shoulder exercises at home.  Fluid intake: Yes: -     PAIN:  Are you having pain? Yes NPRS scale: 8/10Right and Left Pain location:    Pain type: sharp and pressure Pain description: constant    Aggravating factors: sitting, driving, finding comfortable position Relieving factors: lying on stomach relieves buttock pain if she can, but shoulders prevent this   PRECAUTIONS: None   WEIGHT BEARING RESTRICTIONS: No   FALLS:  Has patient fallen in last 6 months? No   LIVING  ENVIRONMENT: Lives with: lives with their family Lives in: House/apartment   OCCUPATION: disabled   PLOF: Independent   PATIENT GOALS: would like to be able to sit; decrease bil hip/low back pain   PERTINENT HISTORY:  Breast augmentation surgeries with follow-up reduction with complication, c/s, gastric bypass, bil tubal ligation, long history of chronic low back/pelvic pain, pudendal neuralgia, coccydynia, Lt glute med repair Sexual abuse: No   BOWEL MOVEMENT: Pain with bowel movement: No Type of bowel movement:Frequency 1x/day and Strain Yes Fully empty rectum: Yes: - Leakage: No Pads: No Fiber supplement: No no medication to help stimulate bowel movements   URINATION: Pain with urination: No Fully empty bladder: No Stream:  trouble starting the stream Urgency: no Frequency: every 30 minutes to an hour - worse once she does go, then she has to keep going - easier to just hold it Leakage:  none Pads: No   INTERCOURSE: Pain with intercourse: Initial Penetration and During Penetration Ability to have vaginal penetration:  Yes: pain - only tolerates 1x/week Climax: yes Marinoff Scale: 2/3   PREGNANCY: Vaginal deliveries 0 Tearing No C-section deliveries 2 Currently pregnant No       OBJECTIVE:  09/02/22: Bil trigger points and myofascial restriction in bil glutes/TFL/lumbar paraspinals  Squat: initially anterior weight translation and bil heel lift; made good corrections with cues and getting weight more posterior  Lumbar A/ROM: extension 20%, flexion 20%, lateral flexion bil 50%  06/23/22: Significant restriction in  Rt hip with trigger points palpated Rt posterior/Lt anterior innominate rotation possible in pelvic  03/03/22: DIAGNOSTIC FINDINGS:  Lasix renogram with no abnormal findings   PATIENT SURVEYS:    PFIQ-7 100%   COGNITION: Overall cognitive status: Within functional limits for tasks assessed                          SENSATION: Light touch:  Appears intact Proprioception: Appears intact   MUSCLE LENGTH:     FUNCTIONAL TESTS:              5xSTS: 29 seconds   GAIT:   Comments: decreased bil hip extension, posterior pelvic tilt   POSTURE: rounded shoulders, forward head, increased lumbar lordosis, and posterior pelvic tilt   PELVIC ALIGNMENT:   LUMBARAROM/PROM:   A/PROM A/PROM  Eval (%)  Flexion 50, pain on Rt  Extension 20, no pain, very limited mobility  Right lateral flexion 50, pain in Rt low back  Left lateral flexion 75, pain in Rt low back  Right rotation 50, good stretch  Left rotation 50, good stretch   (Blank rows = not tested)     LOWER EXTREMITY MMT: tested in standing due to comfort             Rt: 3/5 hip abduction and extension, 4/5 adduction and flexion             Lt: 3/5 hip abduction, extension, flexion, 4/5 adduction     PALPATION:   General  tenderness throughout bil lumbar paraspinals and gluts, Lt>Rt; tenderness and spasm in thoracic paraspinals with decreased mobility upon deep breathing; scar tissue restriction under bil breasts that is tender; scar tissue restriction Lt glut med surgical incision                 External Perineal Exam NA                             Internal Pelvic Floor NA   Patient confirms identification and approves PT to assess internal pelvic floor and treatment Yes for future need   PELVIC MMT:   MMT eval  Vaginal    Internal Anal Sphincter    External Anal Sphincter    Puborectalis    Diastasis Recti    (Blank rows = not tested)         TONE: NA   PROLAPSE: NA   TODAY'S TREATMENT  10/22/22 Manual: Myofascial release to lumbar paraspinals Neuromuscular re-education: Standing march 10x Standing march wit hanterior ball hold 10x Standing march with ball twist 10x Supine march 10x Standing lateral crunch 10x bil Exercises: Standing ball roll outs 12x Standing lateral ball roll outs 6x bil Standing heel raises 2 x 10 Seated piriformis  stretch 60 sec bil Su[ine piriformis stretch 60 sec bil   10/16/22 Manual:  Neuromuscular re-education: Transversus abdominus training with multimodal cues for improved motor control and breath coordination Standing ball presses for core activation 12x Standing UE side press for core activation 10x bil Exercises: Standing ball roll outs 12x Standing lateral body stretch 3 min bil Supported standing over peanut leg extension 10x bil Standing adductor stretch 2 min bil Standing hip flexor stretch 2 min bil Lunge rocking on 6 inch step 10x bil Sidestepping 2 x 10 bil    10/07/22 Manual: Soft tissue mobilization Rt lumbar paraspinals Rt posterolateral hip muscles Rt thoracic paraspinals Rt scapular muscles Exercises:  Standing lateral side bend with doorway support 6 min Clam shells 10x Scapular retraction 10x  PATIENT EDUCATION:  Education details: see above self-care Person educated: Patient Education method: Explanation, Demonstration, Tactile cues, Verbal cues, and Handouts Education comprehension: verbalized understanding   HOME EXERCISE PROGRAM: Written handout   ASSESSMENT:   CLINICAL IMPRESSION: Pt has done well with increasing exercise performance at home over the last week. We did perform manual techniques to low back to help decrease pain with good response; she was able to focus on exercises the rest of session. She demonstrated improved mobility with ball roll outs. We discussed that performing a seated stretch may help to reduce restriction that causes pain in this position and we tried piriformis stretch. Seated too painful today, but she was able to tolerate piriformis stretch in supine better in addition to single knee to chest/march. She reported decrease in sharp pain to 7/10 at end of session. She will continue to benefit from skilled PT intervention in order to decrease pain, improve functional ability, increase ease of starting stream/emptying bladder, and  improve QOL.    OBJECTIVE IMPAIRMENTS: decreased activity tolerance, decreased coordination, decreased endurance, decreased mobility, decreased ROM, decreased strength, hypomobility, increased fascial restrictions, increased muscle spasms, impaired flexibility, impaired tone, postural dysfunction, and pain.    ACTIVITY LIMITATIONS: bending, sitting, standing, stairs, transfers, bed mobility, and locomotion level   PARTICIPATION LIMITATIONS: cleaning, laundry, interpersonal relationship, driving, and community activity   PERSONAL FACTORS: 3+ comorbidities: Breast augmentation surgeries with follow-up reduction with complication, c/s, gastric bypass, bil tubal ligation, long history of chronic low back/pelvic pain, pudendal neuralgia, coccydynia, Lt glut med repair  are also affecting patient's functional outcome.    REHAB POTENTIAL: Good   CLINICAL DECISION MAKING: Stable/uncomplicated   EVALUATION COMPLEXITY: Low     GOALS: Goals reviewed with patient? Yes   SHORT TERM GOALS: Target date: 03/31/2022 - updated 03/26/22 updated 05/14/22 - updated 06/16/22 - updated 2/26 new target 09/01/2022 - updated 08/21/22 - updated 09/01/22 with new goal 11/11/2022 -c updated 09/30/22   Pt will be independent with HEP.    Baseline: Goal status: MET 03/26/22   2.  Pt will be independent with diaphragmatic breathing and down training activities in order to improve pelvic floor relaxation and abdominal wall mobility.   Baseline:  Goal status: MET 05/14/22   3.  Pt will report improve stream initiation and complete voiding with urination in order to improve QOL. Baseline:  Goal status: IN PROGRESS              4.  Pt will increase all impaired lumbar A/ROM by 25% without pain.   Baseline:  Goal status: IN PROGRESS   LONG TERM GOALS: Target date: 04/28/2022 - updated 03/26/22 - updated 05/14/22 - updated 06/16/22 - updated 06/23/22 new target 09/01/2022 - updated 08/21/22 - updated 09/01/22 with new goal 11/11/2022 -  updated 09/30/22   Pt will be independent with advanced HEP.    Baseline:  Goal status:IN PROGRESS   2.  Pt will be able to use regular toilet without difficulty. Baseline: She has returned to raised toilet seat 100% of the time Goal status: IN PROGRESS   3.  Pt will be able to ascend/descend 12 steps at regular intervals throughout the day safely and with minimal difficulty.  Baseline: Pt has not been working on steps due to increase in low back pain - she has been trying to go up 1x/day but it has been more difficult Goal status:  IN PROGRESS   4.  Pt will improve 5xSTS to less than 20 seconds and improved PFIQ by 25% in order to demonstrate improved QOL and functional ability.  Baseline: 28 seconds Goal status: IN PROGRESS   5.  Patient will report no greater than 5/10 pain with sitting in order to more easily attend appointments, drive, and spend time with family.  Baseline:  Goal status:IN PROGRESS   6.  Pt will demonstrate increase in all impaired hip strength by 1 muscle grades in order to demonstrate improved lumbopelvic support and increase functional ability.      Baseline:  Goal status: IN PROGRESS   PLAN:   PT FREQUENCY: 1x/week   PT DURATION: 10 weeks   PLANNED INTERVENTIONS: Therapeutic exercises, Therapeutic activity, Neuromuscular re-education, Balance training, Gait training, Patient/Family education, Self Care, Joint mobilization, Aquatic Therapy, Dry Needling, Biofeedback, and Manual therapy   PLAN FOR NEXT SESSION: Continue focus on mobility and strengthening to tolerance. Re-evaluate.    Julio Alm, PT, DPT06/26/248:37 AM

## 2022-10-24 ENCOUNTER — Encounter: Payer: Self-pay | Admitting: Gastroenterology

## 2022-10-24 ENCOUNTER — Ambulatory Visit (AMBULATORY_SURGERY_CENTER): Payer: PPO

## 2022-10-24 VITALS — Ht 65.0 in | Wt 245.0 lb

## 2022-10-24 DIAGNOSIS — Z86010 Personal history of colonic polyps: Secondary | ICD-10-CM

## 2022-10-24 MED ORDER — SUTAB 1479-225-188 MG PO TABS: 24.0000 | ORAL_TABLET | ORAL | 0 refills | Status: DC

## 2022-10-24 NOTE — Progress Notes (Signed)
Pre visit completed via phone call; Patient verified name, DOB, and address;  No egg or soy allergy known to patient; No issues known to pt with past sedation with any surgeries or procedures; Patient denies ever being told they had issues or difficulty with intubation;  No FH of Malignant Hyperthermia; Pt is not on diet pills; Pt is not on home 02;  Pt is not on blood thinners  Pt reports issues with constipation-in the last 30 days-off/on-patient reports takes a stool softener/Mag oxide/Dulcolax as relief measures for constipation- also advised to increase fluid intake, activity, fruits/veggies;  No A fib or A flutter; Have any cardiac testing pending--NO Pt instructed to use [email or web address] or GoodRx for a price reduction on prep;   Insurance verified during PV appt=HTA  Patient's chart reviewed by Cathlyn Parsons CNRA prior to previsit and patient appropriate for the [facility].  Previsit completed and red dot placed by patient's name on their procedure day (on provider's schedule).    Instructions sent to patient via MyChart per her request; Also per patient request, Sutab Rx with Sutab coupon info sent in [facility]: [id number]  PCN: [id number]  GROUP: [id number] MEMBER ID: [id number])

## 2022-10-28 DIAGNOSIS — F5001 Anorexia nervosa, restricting type: Secondary | ICD-10-CM | POA: Diagnosis not present

## 2022-10-28 DIAGNOSIS — F410 Panic disorder [episodic paroxysmal anxiety] without agoraphobia: Secondary | ICD-10-CM | POA: Diagnosis not present

## 2022-10-28 DIAGNOSIS — F331 Major depressive disorder, recurrent, moderate: Secondary | ICD-10-CM | POA: Diagnosis not present

## 2022-10-29 ENCOUNTER — Ambulatory Visit (INDEPENDENT_AMBULATORY_CARE_PROVIDER_SITE_OTHER): Payer: PPO | Admitting: Family Medicine

## 2022-10-29 VITALS — BP 156/87 | HR 105 | Temp 99.2°F | Ht 65.0 in

## 2022-10-29 DIAGNOSIS — I10 Essential (primary) hypertension: Secondary | ICD-10-CM

## 2022-10-29 MED ORDER — LOSARTAN POTASSIUM 50 MG PO TABS: 50.0000 mg | ORAL_TABLET | Freq: Every day | ORAL | 3 refills | Status: DC

## 2022-10-29 NOTE — Patient Instructions (Signed)
Losartan 50 mg daily.  Lab in 10 days.

## 2022-11-03 ENCOUNTER — Encounter: Payer: Self-pay | Admitting: Family Medicine

## 2022-11-04 ENCOUNTER — Encounter: Payer: Self-pay | Admitting: Hematology

## 2022-11-04 DIAGNOSIS — I10 Essential (primary) hypertension: Secondary | ICD-10-CM | POA: Insufficient documentation
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
## 2022-11-04 NOTE — Assessment & Plan Note (Signed)
Uncontrolled/worsening.  Increasing losartan.  Labs in 10 days.

## 2022-11-05 ENCOUNTER — Ambulatory Visit: Payer: PPO | Attending: Urology

## 2022-11-05 DIAGNOSIS — M25552 Pain in left hip: Secondary | ICD-10-CM

## 2022-11-05 DIAGNOSIS — M62838 Other muscle spasm: Secondary | ICD-10-CM | POA: Diagnosis not present

## 2022-11-05 DIAGNOSIS — R279 Unspecified lack of coordination: Secondary | ICD-10-CM | POA: Diagnosis not present

## 2022-11-05 DIAGNOSIS — R293 Abnormal posture: Secondary | ICD-10-CM | POA: Diagnosis not present

## 2022-11-05 DIAGNOSIS — M6281 Muscle weakness (generalized): Secondary | ICD-10-CM

## 2022-11-05 NOTE — Therapy (Signed)
OUTPATIENT PHYSICAL THERAPY TREATMENT NOTE   Patient Name: Katelyn [name] MRN: [id number] DOB:08/25/1975, 47 y.o., female Today's Date: 11/05/2022  PCP: Babs Sciara, MD  REFERRING PROVIDER: Despina Arias, MD  END OF SESSION:   PT End of Session - 11/05/22 0754     Visit Number 44    Date for PT Re-Evaluation 12/31/22    Authorization Type HTA    Authorization Time Period 4    Authorization - Visit Number 10    Progress Note Due on Visit 50    PT Start Time 0800    PT Stop Time 0840    PT Time Calculation (min) 40 min    Activity Tolerance Patient tolerated treatment well    Behavior During Therapy [facility] for tasks assessed/performed                    Past Medical History:  Diagnosis Date   Anemia    Anorexia    Anxiety    on meds   Asthma    controlled with meds   Benign juvenile melanoma    Chronic headaches    Colon polyps    found on colonoscopy 04/26/2012   Complication of anesthesia    itching after epidural for c section   Constipation    Depression    on meds   Depression    several suicide attempts, hospitaluzed in 2012 for this; hx pf ECT treatments ; pt sees Dr. Lolly Mustache pyschiatrist  and doing well on medication   Dysrhythmia    hx of PVC's (pt hasn't seen cardiologist since 2021- no follow up needed)   GERD (gastroesophageal reflux disease)    Headache    Heart murmur    as a child - no one mentions hearing murmur anymore, sometimes it is heard. Echo has been performed 02/03/18   Heartburn    no meds   History of pneumonia    x 3  years ago - no recent problems   Hx of blood clots 2019   bilateral LE   Left renal atrophy 12/19/2021   LEFT   Neuromuscular disorder (HCC)    small fiber neuropathy   Obesity    Pneumonia    Polycystic ovary    takes metformin to treat   Skin lesion    Excisional biopsy of moles - none cancerous   Sleep apnea    yrs ago - diagnosed mild sleep apnea - did not have to use cpap    Past  Surgical History:  Procedure Laterality Date   BREAST CAPSULECTOMY Bilateral 12/13/2020   1 week later pt had hematoma surgery on left breast   BREAST ENHANCEMENT SURGERY Bilateral 02/11/2017   BREATH TEK H PYLORI N/A 07/22/2013   Procedure: BREATH TEK H PYLORI;  Surgeon: Mariella Saa, MD;  Location: [facility];  Service: General;  Laterality: N/A;   CESAREAN SECTION  2004, 2007   x 2   COLONOSCOPY  2013   COLONOSCOPY  2021   MS-F/V-sutab(good)-[facility]   CYSTOSCOPY WITH URETHRAL DILATATION  age 67    GASTRIC ROUX-EN-Y N/A 10/31/2013   Procedure: LAPAROSCOPIC ROUX-EN-Y GASTRIC BYPASS WITH UPPER ENDOSCOPY;  Surgeon: Mariella Saa, MD;  Location: [facility];  Service: General;  Laterality: N/A;   GLUTEUS MINIMUS REPAIR Left 05/28/2021   Procedure: Left GLUTEUS Medius REPAIR;  Surgeon: Huel Cote, MD;  Location: [facility];  Service: Orthopedics;  Laterality: Left;   ingrown toenail Right    Ingrown nail  on great right toe   kidney stent  08/28/2014   mole excision     "benign juvenile melanoma" removed from left leg - inner thigh   POLYPECTOMY     2013   SKIN LESION EXCISION     back   TONSILLECTOMY  age 43   TUBAL LIGATION  2018   Patient Active Problem List   Diagnosis Date Noted   Essential hypertension 11/04/2022   Panic disorder (episodic paroxysmal anxiety) 07/10/2022   Major depressive disorder, recurrent, moderate (HCC) 07/10/2022   Left renal atrophy 12/19/2021   Stage 2 chronic kidney disease 11/20/2021   GERD (gastroesophageal reflux disease) 09/27/2021   Chronic pain syndrome 09/27/2021   Asthma 07/18/2020   Chronic myofascial pain 03/18/2018   Small fiber neuropathy 03/18/2018   Idiopathic small fiber sensory neuropathy 02/23/2018   Varicose veins of both lower extremities 01/13/2018   Cervical spondylosis without myelopathy 08/28/2017   Migraine with aura and without status migrainosus, not intractable 07/22/2017   Lumbar degenerative disc disease  07/20/2017   Spondylosis of lumbar spine 07/20/2017   Coccydynia 03/12/2017   Bariatric surgery status 05/02/2016   Tachy-brady syndrome (HCC) 12/19/2015   Iron deficiency anemia 06/10/2015   Sleep disorder 06/01/2013   Generalized anxiety disorder 05/30/2013   Depression, major, recurrent, severe with psychosis (HCC) 06/17/2012   Eating disorder 12/18/2011   Constipation 01/20/2011   Polycystic ovarian syndrome 09/01/2008    REFERRING DIAG: M25.511,G89.29,M25.512 (ICD-10-CM) - Chronic pain of both shoulders S76.012A (ICD-10-CM) - Tear of left gluteus medius tendon, initial encounter  THERAPY DIAG:  Abnormal posture  Muscle weakness (generalized)  Other muscle spasm  Unspecified lack of coordination  Pain in left hip  Rationale for Evaluation and Treatment Rehabilitation  PERTINENT HISTORY: Breast augmentation surgeries with follow-up reduction with complication, c/s, gastric bypass, bil tubal ligation, long history of chronic low back/pelvic pain, pudendal neuralgia, coccydynia, Lt glute med repair  PRECAUTIONS: NA  SUBJECTIVE:                                                                                                                                                                                      SUBJECTIVE STATEMENT:  Pt states that she is having HR/BP issues - too high when she is standing and too low when she is lying down. Due to this, she does not want to perform any exercise.   Pt states that she has not been doing exercises like she should be due to feeling bad because of HR/BP abnormalities. She states that pain is a little but better overall resting more. Most of her current pain is low back, tailbone, bil SIJ, and labial  pain. She states that she feels very out-of wack. She is not using raised toilet seat at all currently and has removed from bathroom.   Problem list: -pudendal neuroalgia/intermittent labial pain -Bil hip and low back pain -being unable  to sit -deconditioning -urinary urgency   PAIN:  Are you having pain? Yes, 7/10, Bil hip/low back   03/03/22 SUBJECTIVE STATEMENT: Pt had surgery for Lt glute med tear that most likely happened during fall 3 years ago. She has still had pain in Lt hip, but she is in rehab due to it. She had meningitis in June and ended up having Bil frozen shoulders. Her biggest complaint is of back pain, which impacts her bil shoulder pain because she has to lay on either side. She mostly lies on Rt side, which causes pain in Rt low back and Rt hip. She is rarely getting pressure off of buttocks. She continues to have sacral/coccydynia with sitting and avoids this position. She is not currently having vaginal/pelvic pain, but states this is coming and going - she manages by decreasing frequency of intercourse - no 1x/week. She is having issue with Lt kidney, but MD states that urinary issues are not related.  Is currently in PT for hip and shoulders - primary working on shoulders. Is only doing some basic leg strengthening and shoulder exercises at home.  Fluid intake: Yes: -     PAIN:  Are you having pain? Yes NPRS scale: 8/10Right and Left Pain location:    Pain type: sharp and pressure Pain description: constant    Aggravating factors: sitting, driving, finding comfortable position Relieving factors: lying on stomach relieves buttock pain if she can, but shoulders prevent this   PRECAUTIONS: None   WEIGHT BEARING RESTRICTIONS: No   FALLS:  Has patient fallen in last 6 months? No   LIVING ENVIRONMENT: Lives with: lives with their family Lives in: House/apartment   OCCUPATION: disabled   PLOF: Independent   PATIENT GOALS: would like to be able to sit; decrease bil hip/low back pain   PERTINENT HISTORY:  Breast augmentation surgeries with follow-up reduction with complication, c/s, gastric bypass, bil tubal ligation, long history of chronic low back/pelvic pain, pudendal neuralgia,  coccydynia, Lt glute med repair Sexual abuse: No   BOWEL MOVEMENT: Pain with bowel movement: No Type of bowel movement:Frequency 1x/day and Strain Yes Fully empty rectum: Yes: - Leakage: No Pads: No Fiber supplement: No no medication to help stimulate bowel movements   URINATION: Pain with urination: No Fully empty bladder: No Stream:  trouble starting the stream Urgency: no Frequency: every 30 minutes to an hour - worse once she does go, then she has to keep going - easier to just hold it Leakage:  none Pads: No   INTERCOURSE: Pain with intercourse: Initial Penetration and During Penetration Ability to have vaginal penetration:  Yes: pain - only tolerates 1x/week Climax: yes Marinoff Scale: 2/3   PREGNANCY: Vaginal deliveries 0 Tearing No C-section deliveries 2 Currently pregnant No       OBJECTIVE:  11/05/22: Sitting for 5 minutes BP: 171/103 HR: 103  Bil trigger points and myofascial restriction in bil glutes/TFL/lumbar paraspinals still present with tenderness in bil hips and low back  Squat: Improved form with awareness of heel lift and able to correct, posterior weight shift to activate gluteals, limited decent  Lumbar A/ROM: extension 50% (pain, but not as bad as forward flexion), flexion 50% (going down did not hurt until she started return to standing) ,  lateral flexion bil 100%  Lower extremity strength testing: tested in standing due to comfort  Rt: 3+/5 hip abduction, extension 4/5, 4/5 adduction, and flexion 4+/5             Lt: 3/5 hip abduction, extension 4/5, flexion 4/5, 4/5 adduction *lacking endurance with all strength testing  5x Sit-to-stand: 36 seconds - attempting minimal Rt UE support - noted to have increased BOS and bil valgus knee collapse/pes planus  PFIQ-7: 81%  09/02/22: Bil trigger points and myofascial restriction in bil glutes/TFL/lumbar paraspinals  Squat: initially anterior weight translation and bil heel lift; made good  corrections with cues and getting weight more posterior  Lumbar A/ROM: extension 20%, flexion 20%, lateral flexion bil 50%  06/23/22: Significant restriction in Rt hip with trigger points palpated Rt posterior/Lt anterior innominate rotation possible in pelvic  03/03/22: DIAGNOSTIC FINDINGS:  Lasix renogram with no abnormal findings   PATIENT SURVEYS:    PFIQ-7 100%   COGNITION: Overall cognitive status: Within functional limits for tasks assessed                          SENSATION: Light touch: Appears intact Proprioception: Appears intact   MUSCLE LENGTH:     FUNCTIONAL TESTS:              5xSTS: 29 seconds   GAIT:   Comments: decreased bil hip extension, posterior pelvic tilt   POSTURE: rounded shoulders, forward head, increased lumbar lordosis, and posterior pelvic tilt     LUMBARAROM/PROM:   A/PROM A/PROM  Eval (%)  Flexion 50, pain on Rt  Extension 20, no pain, very limited mobility  Right lateral flexion 50, pain in Rt low back  Left lateral flexion 75, pain in Rt low back  Right rotation 50, good stretch  Left rotation 50, good stretch   (Blank rows = not tested)     LOWER EXTREMITY MMT: tested in standing due to comfort             Rt: 3/5 hip abduction and extension, 4/5 adduction and flexion             Lt: 3/5 hip abduction, extension, flexion, 4/5 adduction     PALPATION:   General  tenderness throughout bil lumbar paraspinals and gluts, Lt>Rt; tenderness and spasm in thoracic paraspinals with decreased mobility upon deep breathing; scar tissue restriction under bil breasts that is tender; scar tissue restriction Lt glut med surgical incision                 External Perineal Exam NA                             Internal Pelvic Floor NA   Patient confirms identification and approves PT to assess internal pelvic floor and treatment Yes for future need   PELVIC MMT:   MMT eval  Vaginal    Internal Anal Sphincter    External Anal Sphincter     Puborectalis    Diastasis Recti    (Blank rows = not tested)         TONE: NA   PROLAPSE: NA   TODAY'S TREATMENT  11/05/22 RE-EVALUATION  10/22/22 Manual: Myofascial release to lumbar paraspinals Neuromuscular re-education: Standing march 10x Standing march wit hanterior ball hold 10x Standing march with ball twist 10x Supine march 10x Standing lateral crunch 10x bil Exercises: Standing ball roll outs 12x  Standing lateral ball roll outs 6x bil Standing heel raises 2 x 10 Seated piriformis stretch 60 sec bil Su[ine piriformis stretch 60 sec bil   10/16/22 Manual:  Neuromuscular re-education: Transversus abdominus training with multimodal cues for improved motor control and breath coordination Standing ball presses for core activation 12x Standing UE side press for core activation 10x bil Exercises: Standing ball roll outs 12x Standing lateral body stretch 3 min bil Supported standing over peanut leg extension 10x bil Standing adductor stretch 2 min bil Standing hip flexor stretch 2 min bil Lunge rocking on 6 inch step 10x bil Sidestepping 2 x 10 bil    PATIENT EDUCATION:  Education details: see above self-care Person educated: Patient Education method: Programmer, multimedia, Demonstration, Tactile cues, Verbal cues, and Handouts Education comprehension: verbalized understanding   HOME EXERCISE PROGRAM: Written handout   ASSESSMENT:   CLINICAL IMPRESSION: Re-evaluation performed today demonstrating improvements in lumbar A/ROM, hip strength, ability to squat, PFIQ-7 score; she had increased time on 5x STS test, but was able to perform with minimal assistance with Rt UE. Subjectively, she feels like she is able to go up steps with increased ease, but still has to come down full flight laterally. She has been able to stop using raised toilet seat. Believe increasing strengthening exercises is having good impact on functional ability, but does cause her increased pain  levels throughout the day. Our intention is that as she gets stronger and sees increased functional ability, she'll be able to progress to activities that will also lower her pain levels. Also believe that increased functional ability and improved activity levels will help with chronic pain. She will continue to benefit from skilled PT intervention in order to decrease pain, improve functional ability, increase ease of starting stream/emptying bladder, and improve QOL.    OBJECTIVE IMPAIRMENTS: decreased activity tolerance, decreased coordination, decreased endurance, decreased mobility, decreased ROM, decreased strength, hypomobility, increased fascial restrictions, increased muscle spasms, impaired flexibility, impaired tone, postural dysfunction, and pain.    ACTIVITY LIMITATIONS: bending, sitting, standing, stairs, transfers, bed mobility, and locomotion level   PARTICIPATION LIMITATIONS: cleaning, laundry, interpersonal relationship, driving, and community activity   PERSONAL FACTORS: 3+ comorbidities: Breast augmentation surgeries with follow-up reduction with complication, c/s, gastric bypass, bil tubal ligation, long history of chronic low back/pelvic pain, pudendal neuralgia, coccydynia, Lt glut med repair  are also affecting patient's functional outcome.    REHAB POTENTIAL: Good   CLINICAL DECISION MAKING: Stable/uncomplicated   EVALUATION COMPLEXITY: Low     GOALS: Goals reviewed with patient? Yes   SHORT TERM GOALS: Target date: 03/31/2022 - updated 03/26/22 updated 05/14/22 - updated 06/16/22 - updated 2/26 new target 09/01/2022 - updated 08/21/22 - updated 09/01/22 with new goal 11/11/2022 -c updated 09/30/22 updated 11/05/22 new goal 12/31/22   Pt will be independent with HEP.    Baseline: Goal status: MET 03/26/22   2.  Pt will be independent with diaphragmatic breathing and down training activities in order to improve pelvic floor relaxation and abdominal wall mobility.   Baseline:   Goal status: MET 05/14/22   3.  Pt will report improve stream initiation and complete voiding with urination in order to improve QOL. Baseline: Improving, but still not completely emptying - double voiding is helpful Goal status: IN PROGRESS              4.  Pt will increase all impaired lumbar A/ROM by 25% without pain.   Baseline:  Goal status: MET 11/05/22   LONG TERM  GOALS: Target date: 04/28/2022 - updated 03/26/22 - updated 05/14/22 - updated 06/16/22 - updated 06/23/22 new target 09/01/2022 - updated 08/21/22 - updated 09/01/22 with new goal 11/11/2022 - updated 09/30/22 - updated 11/05/22 new goal 12/31/22   Pt will be independent with advanced HEP.    Baseline:  Goal status:IN PROGRESS   2.  Pt will be able to use regular toilet without difficulty. Baseline: She has returned to raised toilet seat 100% of the time Goal status: IN PROGRESS   3.  Pt will be able to ascend/descend 12 steps at regular intervals throughout the day safely and with minimal difficulty.  Baseline: She is able to perform 1 flight of stairs a day, but no more - feels like it is getting easier - down is still harder Goal status: IN PROGRESS   4.  Pt will improve 5xSTS to less than 20 seconds and improved PFIQ by 25% in order to demonstrate improved QOL and functional ability.  Baseline: Increased to 36 seconds on 5x STS, but limited use of Bil UE support during this testing; PFIQ 81% (from 100%) Goal status: IN PROGRESS   5.  Patient will report no greater than 5/10 pain with sitting in order to more easily attend appointments, drive, and spend time with family.  Baseline: Pt has not been down to 5/10 pain yet - lowest pain level she reports is 7/10 Goal status:IN PROGRESS   6.  Pt will demonstrate increase in all impaired hip strength by 1 muscle grades in order to demonstrate improved lumbopelvic support and increase functional ability.      Baseline: Hip strength improving, especially on Rt side; bil adduction  and Lt abduction has not seen improvements Goal status: IN PROGRESS  7. Pt will be able to bend over to pick things off of floor and get things out of lower drawers in bathroom/dresser.      Baseline: unable/causing severe pain the rest of the day Goal status: INITIAL   PLAN:   PT FREQUENCY: 1x/week   PT DURATION: 8 weeks   PLANNED INTERVENTIONS: Therapeutic exercises, Therapeutic activity, Neuromuscular re-education, Balance training, Gait training, Patient/Family education, Self Care, Joint mobilization, Aquatic Therapy, Dry Needling, Biofeedback, and Manual therapy   PLAN FOR NEXT SESSION: Continue to focus on strengthening and mobility program; possible internal pelvic floor exam   Julio Alm, PT, DPT07/10/247:55 AM

## 2022-11-05 NOTE — Telephone Encounter (Signed)
Front-please set up the patient to do office visit with me Monday if no availability on Monday and then Tuesday at the latest

## 2022-11-06 DIAGNOSIS — F331 Major depressive disorder, recurrent, moderate: Secondary | ICD-10-CM | POA: Diagnosis not present

## 2022-11-06 DIAGNOSIS — F900 Attention-deficit hyperactivity disorder, predominantly inattentive type: Secondary | ICD-10-CM | POA: Diagnosis not present

## 2022-11-06 DIAGNOSIS — F411 Generalized anxiety disorder: Secondary | ICD-10-CM | POA: Diagnosis not present

## 2022-11-10 ENCOUNTER — Ambulatory Visit: Payer: PPO | Admitting: Family Medicine

## 2022-11-10 DIAGNOSIS — I10 Essential (primary) hypertension: Secondary | ICD-10-CM | POA: Diagnosis not present

## 2022-11-11 LAB — BASIC METABOLIC PANEL
BUN/Creatinine Ratio: 10 (ref 9–23)
BUN: 8 mg/dL (ref 6–24)
CO2: 24 mmol/L (ref 20–29)
Calcium: 9.4 mg/dL (ref 8.7–10.2)
Chloride: 101 mmol/L (ref 96–106)
Creatinine, Ser: 0.81 mg/dL (ref 0.57–1.00)
Glucose: 93 mg/dL (ref 70–99)
Potassium: 4.7 mmol/L (ref 3.5–5.2)
Sodium: 140 mmol/L (ref 134–144)
eGFR: 90 mL/min/{1.73_m2} (ref >59)

## 2022-11-12 ENCOUNTER — Ambulatory Visit (AMBULATORY_SURGERY_CENTER): Payer: PPO | Admitting: Gastroenterology

## 2022-11-12 ENCOUNTER — Encounter: Payer: Self-pay | Admitting: Gastroenterology

## 2022-11-12 VITALS — BP 119/73 | HR 80 | Temp 96.2°F | Resp 13 | Ht 65.0 in | Wt 245.0 lb

## 2022-11-12 DIAGNOSIS — K635 Polyp of colon: Secondary | ICD-10-CM

## 2022-11-12 DIAGNOSIS — D122 Benign neoplasm of ascending colon: Secondary | ICD-10-CM

## 2022-11-12 DIAGNOSIS — Z86010 Personal history of colonic polyps: Secondary | ICD-10-CM | POA: Diagnosis not present

## 2022-11-12 DIAGNOSIS — D120 Benign neoplasm of cecum: Secondary | ICD-10-CM

## 2022-11-12 DIAGNOSIS — D128 Benign neoplasm of rectum: Secondary | ICD-10-CM

## 2022-11-12 DIAGNOSIS — Z09 Encounter for follow-up examination after completed treatment for conditions other than malignant neoplasm: Secondary | ICD-10-CM

## 2022-11-12 DIAGNOSIS — D125 Benign neoplasm of sigmoid colon: Secondary | ICD-10-CM | POA: Diagnosis not present

## 2022-11-12 DIAGNOSIS — D123 Benign neoplasm of transverse colon: Secondary | ICD-10-CM

## 2022-11-12 DIAGNOSIS — D124 Benign neoplasm of descending colon: Secondary | ICD-10-CM

## 2022-11-12 MED ORDER — SODIUM CHLORIDE 0.9 % IV SOLN
500.0000 mL | Freq: Once | INTRAVENOUS | Status: DC
Start: 2022-11-12 — End: 2022-11-12

## 2022-11-12 NOTE — Progress Notes (Signed)
 See 10/28/2022 H&P no changes

## 2022-11-12 NOTE — Patient Instructions (Signed)
Discharge instructions given. Handouts on polyps. Resume previous medications. YOU HAD AN ENDOSCOPIC PROCEDURE TODAY AT THE [facility]:   Refer to the procedure report that was given to you for any specific questions about what was found during the examination.  If the procedure report does not answer your questions, please call your gastroenterologist to clarify.  If you requested that your care partner not be given the details of your procedure findings, then the procedure report has been included in a sealed envelope for you to review at your convenience later.  YOU SHOULD EXPECT: Some feelings of bloating in the abdomen. Passage of more gas than usual.  Walking can help get rid of the air that was put into your GI tract during the procedure and reduce the bloating. If you had a lower endoscopy (such as a colonoscopy or flexible sigmoidoscopy) you may notice spotting of blood in your stool or on the toilet paper. If you underwent a bowel prep for your procedure, you may not have a normal bowel movement for a few days.  Please Note:  You might notice some irritation and congestion in your nose or some drainage.  This is from the oxygen used during your procedure.  There is no need for concern and it should clear up in a day or so.  SYMPTOMS TO REPORT IMMEDIATELY:  Following lower endoscopy (colonoscopy or flexible sigmoidoscopy):  Excessive amounts of blood in the stool  Significant tenderness or worsening of abdominal pains  Swelling of the abdomen that is new, acute  Fever of 100F or higher   For urgent or emergent issues, a gastroenterologist can be reached at any hour by calling [phone]. Do not use MyChart messaging for urgent concerns.    DIET:  We do recommend a small meal at first, but then you may proceed to your regular diet.  Drink plenty of fluids but you should avoid alcoholic beverages for 24 hours.  ACTIVITY:  You should plan to take it easy for the rest  of today and you should NOT DRIVE or use heavy machinery until tomorrow (because of the sedation medicines used during the test).    FOLLOW UP: Our staff will call the number listed on your records the next business day following your procedure.  We will call around 7:15- 8:00 am to check on you and address any questions or concerns that you may have regarding the information given to you following your procedure. If we do not reach you, we will leave a message.     If any biopsies were taken you will be contacted by phone or by letter within the next 1-3 weeks.  Please call us at [phone] if you have not heard about the biopsies in 3 weeks.    SIGNATURES/CONFIDENTIALITY: You and/or your care partner have signed paperwork which will be entered into your electronic medical record.  These signatures attest to the fact that that the information above on your After Visit Summary has been reviewed and is understood.  Full responsibility of the confidentiality of this discharge information lies with you and/or your care-partner.

## 2022-11-12 NOTE — Op Note (Signed)
[facility] Patient Name: Katelyn [name] Procedure Date: 11/12/2022 7:26 AM MRN: [id number] Endoscopist: Meryl Dare , MD, [phone] Age: 47 Referring MD:  Date of Birth: [birth year] Gender: Female Account #: [id number] Procedure:                Colonoscopy Indications:              High risk colon cancer surveillance: Personal                            history of sessile serrated colon polyp (less than                            10 mm in size) with no dysplasia Medicines:                Monitored Anesthesia Care Procedure:                Pre-Anesthesia Assessment:                           - Prior to the procedure, a History and Physical                            was performed, and patient medications and                            allergies were reviewed. The patient's tolerance of                            previous anesthesia was also reviewed. The risks                            and benefits of the procedure and the sedation                            options and risks were discussed with the patient.                            All questions were answered, and informed consent                            was obtained. Prior Anticoagulants: The patient has                            taken no anticoagulant or antiplatelet agents. ASA                            Grade Assessment: III - A patient with severe                            systemic disease. After reviewing the risks and                            benefits, the patient was deemed in satisfactory  condition to undergo the procedure.                           After obtaining informed consent, the colonoscope                            was passed under direct vision. Throughout the                            procedure, the patient's blood pressure, pulse, and                            oxygen saturations were monitored continuously. The                            Olympus CF-HQ190L  [id number]) Colonoscope was                            introduced through the anus and advanced to the the                            cecum, identified by appendiceal orifice and                            ileocecal valve. The ileocecal valve, appendiceal                            orifice, and rectum were photographed. The quality                            of the bowel preparation was good. The colonoscopy                            was performed without difficulty. The patient                            tolerated the procedure well. Scope In: 8:04:52 AM Scope Out: 8:27:35 AM Scope Withdrawal Time: 0 hours 17 minutes 34 seconds  Total Procedure Duration: 0 hours 22 minutes 43 seconds  Findings:                 The perianal and digital rectal examinations were                            normal.                           Six sessile polyps were found in the rectum,                            sigmoid colon, descending colon, transverse colon,                            ascending colon and cecum. The polyps were 4 to 7  mm in size. These polyps were removed with a cold                            snare. Resection and retrieval were complete.                           The exam was otherwise without abnormality on                            direct and retroflexion views. Complications:            No immediate complications. Estimated blood loss:                            None. Estimated Blood Loss:     Estimated blood loss: none. Impression:               - Six 4 to 7 mm polyps in the rectum, in the                            sigmoid colon, in the descending colon, in the                            transverse colon, in the ascending colon and in the                            cecum, removed with a cold snare. Resected and                            retrieved.                           - The examination was otherwise normal on direct                            and  retroflexion views. Recommendation:           - Repeat colonoscopy date to be determined, after                            pending pathology results are reviewed for                            surveillance based on pathology results.                           - Patient has a contact number available for                            emergencies. The signs and symptoms of potential                            delayed complications were discussed with the  patient. Return to normal activities tomorrow.                            Written discharge instructions were provided to the                            patient.                           - Resume previous diet.                           - Continue present medications.                           - Await pathology results. Meryl Dare, MD 11/12/2022 8:32:59 AM This report has been signed electronically.

## 2022-11-12 NOTE — Progress Notes (Signed)
Vitals-Katelyn [name]  [name] states no medical or surgical changes since previsit or office visit.  Pt states wants to use propofol.   Pt has very poor veins.  Patient uses foam support to help relieve pain.

## 2022-11-12 NOTE — Progress Notes (Signed)
Patient with chronic left hip pain. Positioned self to comfort with pad prior to sedation.  Uneventful Propofol anesthetic. Report to pacu rn. Vss. Care resumed by rn.

## 2022-11-12 NOTE — Progress Notes (Signed)
 Called to room to assist during endoscopic procedure.  Patient ID and intended procedure confirmed with present staff. Received instructions for my participation in the procedure from the performing physician.  

## 2022-11-13 ENCOUNTER — Telehealth: Payer: Self-pay

## 2022-11-13 NOTE — Telephone Encounter (Signed)
  Follow up Call-     11/12/2022    7:17 AM 11/12/2022    7:13 AM  Call back number  Post procedure Call Back phone  # [phone]   Permission to leave phone message  Yes     Patient questions:  Do you have a fever, pain , or abdominal swelling? No. Pain Score  0 *  Have you tolerated food without any problems? Yes.    Have you been able to return to your normal activities? Yes.    Do you have any questions about your discharge instructions: Diet   No. Medications  No. Follow up visit  No.  Do you have questions or concerns about your Care? No.  Actions: * If pain score is 4 or above: No action needed, pain <4.

## 2022-11-14 DIAGNOSIS — F331 Major depressive disorder, recurrent, moderate: Secondary | ICD-10-CM | POA: Diagnosis not present

## 2022-11-14 DIAGNOSIS — F411 Generalized anxiety disorder: Secondary | ICD-10-CM | POA: Diagnosis not present

## 2022-11-14 DIAGNOSIS — F900 Attention-deficit hyperactivity disorder, predominantly inattentive type: Secondary | ICD-10-CM | POA: Diagnosis not present

## 2022-11-17 ENCOUNTER — Ambulatory Visit (INDEPENDENT_AMBULATORY_CARE_PROVIDER_SITE_OTHER): Payer: PPO | Admitting: Family Medicine

## 2022-11-17 VITALS — BP 124/78 | HR 83

## 2022-11-17 DIAGNOSIS — I10 Essential (primary) hypertension: Secondary | ICD-10-CM

## 2022-11-17 NOTE — Progress Notes (Unsigned)
   Subjective:    Patient ID: Katelyn [name], female    DOB: Sep 22, 1975, 47 y.o.   MRN: [id number]  HPI Patient arrives today for 2 week follow up. Patient states she is having BP issues and she also needs excuse for jury duty for the end of August.  Patient here for follow-up Taking her medicine Blood pressure seemingly under good control Initially blood pressure was elevated but most recent ones look good Tolerating medicine well Review of Systems     Objective:   Physical Exam  General-in no acute distress Eyes-no discharge Lungs-respiratory rate normal, CTA CV-no murmurs,RRR Extremities skin warm dry no edema Neuro grossly normal Behavior normal, alert Blood pressure check laying and sitting no appreciable changes      Assessment & Plan:  HTN Good control Continue current measures Follow-up if any ongoing troubles Keep regular follow-up visit Will go ahead and give jury note patient unable to do jury because of her neuropathy issues and unable to stay seated on a regular basis

## 2022-11-18 ENCOUNTER — Inpatient Hospital Stay: Payer: PPO | Attending: Physician Assistant

## 2022-11-18 ENCOUNTER — Encounter: Payer: Self-pay | Admitting: Gastroenterology

## 2022-11-18 ENCOUNTER — Encounter: Payer: Self-pay | Admitting: Family Medicine

## 2022-11-18 DIAGNOSIS — D72829 Elevated white blood cell count, unspecified: Secondary | ICD-10-CM | POA: Diagnosis not present

## 2022-11-18 DIAGNOSIS — D7589 Other specified diseases of blood and blood-forming organs: Secondary | ICD-10-CM

## 2022-11-18 LAB — CBC WITH DIFFERENTIAL/PLATELET
Abs Immature Granulocytes: 0.08 10*3/uL — ABNORMAL HIGH (ref 0.00–0.07)
Basophils Absolute: 0 10*3/uL (ref 0.0–0.1)
Basophils Relative: 0 %
Eosinophils Absolute: 0.1 10*3/uL (ref 0.0–0.5)
Eosinophils Relative: 1 %
HCT: 44.5 % (ref 36.0–46.0)
Hemoglobin: 14.5 g/dL (ref 12.0–15.0)
Immature Granulocytes: 1 %
Lymphocytes Relative: 17 %
Lymphs Abs: 1.9 10*3/uL (ref 0.7–4.0)
MCH: 32 pg (ref 26.0–34.0)
MCHC: 32.6 g/dL (ref 30.0–36.0)
MCV: 98.2 fL (ref 80.0–100.0)
Monocytes Absolute: 0.6 10*3/uL (ref 0.1–1.0)
Monocytes Relative: 5 %
Neutro Abs: 8.6 10*3/uL — ABNORMAL HIGH (ref 1.7–7.7)
Neutrophils Relative %: 76 %
Platelets: 376 10*3/uL (ref 150–400)
RBC: 4.53 MIL/uL (ref 3.87–5.11)
RDW: 13.2 % (ref 11.5–15.5)
WBC: 11.3 10*3/uL — ABNORMAL HIGH (ref 4.0–10.5)
nRBC: 0 % (ref 0.0–0.2)

## 2022-11-18 LAB — C-REACTIVE PROTEIN: CRP: 1.5 mg/dL — ABNORMAL HIGH (ref <1.0)

## 2022-11-18 LAB — SEDIMENTATION RATE: Sed Rate: 33 mm/h — ABNORMAL HIGH (ref 0–22)

## 2022-11-18 NOTE — Progress Notes (Signed)
Please print and mail to patient or she can pick it up

## 2022-11-18 NOTE — Telephone Encounter (Signed)
Refill request sent to MD.

## 2022-11-19 ENCOUNTER — Inpatient Hospital Stay: Payer: PPO

## 2022-11-23 ENCOUNTER — Encounter: Payer: Self-pay | Admitting: Family Medicine

## 2022-11-24 DIAGNOSIS — N182 Chronic kidney disease, stage 2 (mild): Secondary | ICD-10-CM | POA: Diagnosis not present

## 2022-11-24 DIAGNOSIS — N261 Atrophy of kidney (terminal): Secondary | ICD-10-CM | POA: Diagnosis not present

## 2022-11-24 DIAGNOSIS — R809 Proteinuria, unspecified: Secondary | ICD-10-CM | POA: Diagnosis not present

## 2022-11-24 DIAGNOSIS — I129 Hypertensive chronic kidney disease with stage 1 through stage 4 chronic kidney disease, or unspecified chronic kidney disease: Secondary | ICD-10-CM | POA: Diagnosis not present

## 2022-11-24 DIAGNOSIS — G629 Polyneuropathy, unspecified: Secondary | ICD-10-CM | POA: Diagnosis not present

## 2022-12-02 DIAGNOSIS — M7061 Trochanteric bursitis, right hip: Secondary | ICD-10-CM | POA: Diagnosis not present

## 2022-12-02 DIAGNOSIS — M533 Sacrococcygeal disorders, not elsewhere classified: Secondary | ICD-10-CM | POA: Diagnosis not present

## 2022-12-02 DIAGNOSIS — G894 Chronic pain syndrome: Secondary | ICD-10-CM | POA: Diagnosis not present

## 2022-12-02 DIAGNOSIS — Z79891 Long term (current) use of opiate analgesic: Secondary | ICD-10-CM | POA: Diagnosis not present

## 2022-12-02 DIAGNOSIS — M47816 Spondylosis without myelopathy or radiculopathy, lumbar region: Secondary | ICD-10-CM | POA: Diagnosis not present

## 2022-12-04 DIAGNOSIS — F411 Generalized anxiety disorder: Secondary | ICD-10-CM | POA: Diagnosis not present

## 2022-12-04 DIAGNOSIS — F331 Major depressive disorder, recurrent, moderate: Secondary | ICD-10-CM | POA: Diagnosis not present

## 2022-12-04 DIAGNOSIS — F900 Attention-deficit hyperactivity disorder, predominantly inattentive type: Secondary | ICD-10-CM | POA: Diagnosis not present

## 2022-12-07 NOTE — Progress Notes (Signed)
Pharmacy Quality Measure Review  This patient is appearing on a report for being at risk of failing the adherence measure for hypertension (ACEi/ARB) medications this calendar year.   Medication: losartan 25 mg Last fill date: 10/29/22 for 90 day supply - refills remaining  Insurance report was not up to date. No action needed at this time.   Nils Pyle, PharmD PGY1 Pharmacy Resident

## 2022-12-08 ENCOUNTER — Ambulatory Visit: Payer: PPO | Attending: Urology

## 2022-12-08 DIAGNOSIS — M6281 Muscle weakness (generalized): Secondary | ICD-10-CM | POA: Diagnosis not present

## 2022-12-08 DIAGNOSIS — M25552 Pain in left hip: Secondary | ICD-10-CM

## 2022-12-08 DIAGNOSIS — R293 Abnormal posture: Secondary | ICD-10-CM | POA: Diagnosis not present

## 2022-12-08 DIAGNOSIS — M62838 Other muscle spasm: Secondary | ICD-10-CM | POA: Diagnosis not present

## 2022-12-08 DIAGNOSIS — R279 Unspecified lack of coordination: Secondary | ICD-10-CM

## 2022-12-08 NOTE — Therapy (Signed)
OUTPATIENT PHYSICAL THERAPY TREATMENT NOTE   Patient Name: Katelyn [name] MRN: [id number] DOB:[birth year], 47 y.o., female Today's Date: 12/08/2022  PCP: Babs Sciara, MD  REFERRING PROVIDER: Despina Arias, MD  END OF SESSION:   PT End of Session - 12/08/22 0930     Visit Number 45    Date for PT Re-Evaluation 12/31/22    Authorization Type HTA    Authorization Time Period 5    Authorization - Visit Number 10    Progress Note Due on Visit 50    PT Start Time 0930    PT Stop Time 1010    PT Time Calculation (min) 40 min    Activity Tolerance Patient tolerated treatment well    Behavior During Therapy [facility] for tasks assessed/performed                     Past Medical History:  Diagnosis Date   Anemia    Anorexia    Anxiety    on meds   Asthma    controlled with meds   Benign juvenile melanoma    Chronic headaches    Colon polyps    found on colonoscopy 04/26/2012   Complication of anesthesia    itching after epidural for c section   Constipation    Depression    on meds   Depression    several suicide attempts, hospitaluzed in 2012 for this; hx pf ECT treatments ; pt sees Dr. Lolly Mustache pyschiatrist  and doing well on medication   Dysrhythmia    hx of PVC's (pt hasn't seen cardiologist since 2021- no follow up needed)   GERD (gastroesophageal reflux disease)    Headache    Heart murmur    as a child - no one mentions hearing murmur anymore, sometimes it is heard. Echo has been performed 02/03/18   Heartburn    no meds   History of pneumonia    x 3  years ago - no recent problems   Hx of blood clots 2019   bilateral LE   Left renal atrophy 12/19/2021   LEFT   Neuromuscular disorder (HCC)    small fiber neuropathy   Obesity    Pneumonia    Polycystic ovary    takes metformin to treat   Skin lesion    Excisional biopsy of moles - none cancerous   Sleep apnea    yrs ago - diagnosed mild sleep apnea - did not have to use cpap    Past  Surgical History:  Procedure Laterality Date   BREAST CAPSULECTOMY Bilateral 12/13/2020   1 week later pt had hematoma surgery on left breast   BREAST ENHANCEMENT SURGERY Bilateral 02/11/2017   BREATH TEK H PYLORI N/A 07/22/2013   Procedure: BREATH TEK H PYLORI;  Surgeon: Mariella Saa, MD;  Location: [facility];  Service: General;  Laterality: N/A;   CESAREAN SECTION  2004, 2007   x 2   COLONOSCOPY  2013   COLONOSCOPY  2021   MS-F/V-sutab(good)-[facility]   CYSTOSCOPY WITH URETHRAL DILATATION  age 68    GASTRIC ROUX-EN-Y N/A 10/31/2013   Procedure: LAPAROSCOPIC ROUX-EN-Y GASTRIC BYPASS WITH UPPER ENDOSCOPY;  Surgeon: Mariella Saa, MD;  Location: [facility];  Service: General;  Laterality: N/A;   GLUTEUS MINIMUS REPAIR Left 05/28/2021   Procedure: Left GLUTEUS Medius REPAIR;  Surgeon: Huel Cote, MD;  Location: [facility];  Service: Orthopedics;  Laterality: Left;   ingrown toenail Right    Ingrown  nail on great right toe   kidney stent  08/28/2014   mole excision     "benign juvenile melanoma" removed from left leg - inner thigh   POLYPECTOMY     2013   SKIN LESION EXCISION     back   TONSILLECTOMY  age 19   TUBAL LIGATION  2018   Patient Active Problem List   Diagnosis Date Noted   Essential hypertension 11/04/2022   Panic disorder (episodic paroxysmal anxiety) 07/10/2022   Major depressive disorder, recurrent, moderate (HCC) 07/10/2022   Left renal atrophy 12/19/2021   Stage 2 chronic kidney disease 11/20/2021   GERD (gastroesophageal reflux disease) 09/27/2021   Chronic pain syndrome 09/27/2021   Asthma 07/18/2020   Chronic myofascial pain 03/18/2018   Small fiber neuropathy 03/18/2018   Idiopathic small fiber sensory neuropathy 02/23/2018   Varicose veins of both lower extremities 01/13/2018   Cervical spondylosis without myelopathy 08/28/2017   Migraine with aura and without status migrainosus, not intractable 07/22/2017   Lumbar degenerative disc disease  07/20/2017   Spondylosis of lumbar spine 07/20/2017   Coccydynia 03/12/2017   Bariatric surgery status 05/02/2016   Tachy-brady syndrome (HCC) 12/19/2015   Iron deficiency anemia 06/10/2015   Sleep disorder 06/01/2013   Generalized anxiety disorder 05/30/2013   Depression, major, recurrent, severe with psychosis (HCC) 06/17/2012   Eating disorder 12/18/2011   Constipation 01/20/2011   Polycystic ovarian syndrome 09/01/2008    REFERRING DIAG: M25.511,G89.29,M25.512 (ICD-10-CM) - Chronic pain of both shoulders S76.012A (ICD-10-CM) - Tear of left gluteus medius tendon, initial encounter  THERAPY DIAG:  Abnormal posture  Muscle weakness (generalized)  Other muscle spasm  Unspecified lack of coordination  Pain in left hip  Rationale for Evaluation and Treatment Rehabilitation  PERTINENT HISTORY: Breast augmentation surgeries with follow-up reduction with complication, c/s, gastric bypass, bil tubal ligation, long history of chronic low back/pelvic pain, pudendal neuralgia, coccydynia, Lt glute med repair  PRECAUTIONS: NA  SUBJECTIVE:                                                                                                                                                                                      SUBJECTIVE STATEMENT:  Pt states that she is having more pain since last visit but she has been working on doing more. She has been walking through the grocery store, driving there and back, and putting groceries away. She feels like stamina is getting a little bit better. She is still having a lot of urinary urgency, but thinks this may be more due to small fiber neuropathy. She is working on urge drill to help with this.   Problem list: -pudendal neuroalgia/intermittent  labial pain -Bil hip and low back pain -being unable to sit -deconditioning -urinary urgency   PAIN:  Are you having pain? Yes, 7/10, Bil hip/low back   03/03/22 SUBJECTIVE STATEMENT: Pt had  surgery for Lt glute med tear that most likely happened during fall 3 years ago. She has still had pain in Lt hip, but she is in rehab due to it. She had meningitis in June and ended up having Bil frozen shoulders. Her biggest complaint is of back pain, which impacts her bil shoulder pain because she has to lay on either side. She mostly lies on Rt side, which causes pain in Rt low back and Rt hip. She is rarely getting pressure off of buttocks. She continues to have sacral/coccydynia with sitting and avoids this position. She is not currently having vaginal/pelvic pain, but states this is coming and going - she manages by decreasing frequency of intercourse - no 1x/week. She is having issue with Lt kidney, but MD states that urinary issues are not related.  Is currently in PT for hip and shoulders - primary working on shoulders. Is only doing some basic leg strengthening and shoulder exercises at home.  Fluid intake: Yes: -     PAIN:  Are you having pain? Yes NPRS scale: 8/10Right and Left Pain location:    Pain type: sharp and pressure Pain description: constant    Aggravating factors: sitting, driving, finding comfortable position Relieving factors: lying on stomach relieves buttock pain if she can, but shoulders prevent this   PRECAUTIONS: None   WEIGHT BEARING RESTRICTIONS: No   FALLS:  Has patient fallen in last 6 months? No   LIVING ENVIRONMENT: Lives with: lives with their family Lives in: House/apartment   OCCUPATION: disabled   PLOF: Independent   PATIENT GOALS: would like to be able to sit; decrease bil hip/low back pain   PERTINENT HISTORY:  Breast augmentation surgeries with follow-up reduction with complication, c/s, gastric bypass, bil tubal ligation, long history of chronic low back/pelvic pain, pudendal neuralgia, coccydynia, Lt glute med repair Sexual abuse: No   BOWEL MOVEMENT: Pain with bowel movement: No Type of bowel movement:Frequency 1x/day and Strain  Yes Fully empty rectum: Yes: - Leakage: No Pads: No Fiber supplement: No no medication to help stimulate bowel movements   URINATION: Pain with urination: No Fully empty bladder: No Stream:  trouble starting the stream Urgency: no Frequency: every 30 minutes to an hour - worse once she does go, then she has to keep going - easier to just hold it Leakage:  none Pads: No   INTERCOURSE: Pain with intercourse: Initial Penetration and During Penetration Ability to have vaginal penetration:  Yes: pain - only tolerates 1x/week Climax: yes Marinoff Scale: 2/3   PREGNANCY: Vaginal deliveries 0 Tearing No C-section deliveries 2 Currently pregnant No       OBJECTIVE:  11/05/22: Sitting for 5 minutes BP: 171/103 HR: 103  Bil trigger points and myofascial restriction in bil glutes/TFL/lumbar paraspinals still present with tenderness in bil hips and low back  Squat: Improved form with awareness of heel lift and able to correct, posterior weight shift to activate gluteals, limited decent  Lumbar A/ROM: extension 50% (pain, but not as bad as forward flexion), flexion 50% (going down did not hurt until she started return to standing) , lateral flexion bil 100%  Lower extremity strength testing: tested in standing due to comfort  Rt: 3+/5 hip abduction, extension 4/5, 4/5 adduction, and flexion 4+/5  Lt: 3/5 hip abduction, extension 4/5, flexion 4/5, 4/5 adduction *lacking endurance with all strength testing  5x Sit-to-stand: 36 seconds - attempting minimal Rt UE support - noted to have increased BOS and bil valgus knee collapse/pes planus  PFIQ-7: 81%  09/02/22: Bil trigger points and myofascial restriction in bil glutes/TFL/lumbar paraspinals  Squat: initially anterior weight translation and bil heel lift; made good corrections with cues and getting weight more posterior  Lumbar A/ROM: extension 20%, flexion 20%, lateral flexion bil 50%  06/23/22: Significant  restriction in Rt hip with trigger points palpated Rt posterior/Lt anterior innominate rotation possible in pelvic  03/03/22: DIAGNOSTIC FINDINGS:  Lasix renogram with no abnormal findings   PATIENT SURVEYS:    PFIQ-7 100%   COGNITION: Overall cognitive status: Within functional limits for tasks assessed                          SENSATION: Light touch: Appears intact Proprioception: Appears intact   MUSCLE LENGTH:     FUNCTIONAL TESTS:              5xSTS: 29 seconds   GAIT:   Comments: decreased bil hip extension, posterior pelvic tilt   POSTURE: rounded shoulders, forward head, increased lumbar lordosis, and posterior pelvic tilt     LUMBARAROM/PROM:   A/PROM A/PROM  Eval (%)  Flexion 50, pain on Rt  Extension 20, no pain, very limited mobility  Right lateral flexion 50, pain in Rt low back  Left lateral flexion 75, pain in Rt low back  Right rotation 50, good stretch  Left rotation 50, good stretch   (Blank rows = not tested)     LOWER EXTREMITY MMT: tested in standing due to comfort             Rt: 3/5 hip abduction and extension, 4/5 adduction and flexion             Lt: 3/5 hip abduction, extension, flexion, 4/5 adduction     PALPATION:   General  tenderness throughout bil lumbar paraspinals and gluts, Lt>Rt; tenderness and spasm in thoracic paraspinals with decreased mobility upon deep breathing; scar tissue restriction under bil breasts that is tender; scar tissue restriction Lt glut med surgical incision                 External Perineal Exam NA                             Internal Pelvic Floor NA   Patient confirms identification and approves PT to assess internal pelvic floor and treatment Yes for future need   PELVIC MMT:   MMT eval  Vaginal    Internal Anal Sphincter    External Anal Sphincter    Puborectalis    Diastasis Recti    (Blank rows = not tested)         TONE: NA   PROLAPSE: NA   TODAY'S TREATMENT   12/08/22 Manual: Myofascial release to lumbar paraspinals Negative pressure soft tissue mobilization Neuromuscular re-education: Pelvic tilts against wall for improved abdominal activation and motor control in low back/pelvis 10x - breath coordination Diaphragmatic breathing in seated position with pelvic floor muscle bulge  Exercises: Doorway side body stretch 60 sec bil Seated piriformis stretch 60 sec bil Seated hamstring stretch 60 sec bil  11/05/22 RE-EVALUATION  10/22/22 Manual: Myofascial release to lumbar paraspinals Neuromuscular re-education: Standing march 10x Standing march  wit hanterior ball hold 10x Standing march with ball twist 10x Supine march 10x Standing lateral crunch 10x bil Exercises: Standing ball roll outs 12x Standing lateral ball roll outs 6x bil Standing heel raises 2 x 10 Seated piriformis stretch 60 sec bil Supine piriformis stretch 60 sec bil   PATIENT EDUCATION:  Education details: see above self-care Person educated: Patient Education method: Explanation, Demonstration, Tactile cues, Verbal cues, and Handouts Education comprehension: verbalized understanding   HOME EXERCISE PROGRAM: Written handout   ASSESSMENT:   CLINICAL IMPRESSION: Pt overall doing well considering she has not been in to PT in weeks. She has been working hard on building stamina back up, but states this has caused her more pain. She tolerated manual techniques well to low back, hips, and coccyx. Believe internal pelvic exam and treatment will be beneficial in future sessions in order to help decrease pain and urgency. She did well with seated stretches, starting to slowly work on building her sitting tolerance back up. We discussed ergonomics in this position and trying to remain in anterior pelvic tilt. She will continue to benefit from skilled PT intervention in order to decrease pain, improve functional ability, increase ease of starting stream/emptying bladder, and  improve QOL.    OBJECTIVE IMPAIRMENTS: decreased activity tolerance, decreased coordination, decreased endurance, decreased mobility, decreased ROM, decreased strength, hypomobility, increased fascial restrictions, increased muscle spasms, impaired flexibility, impaired tone, postural dysfunction, and pain.    ACTIVITY LIMITATIONS: bending, sitting, standing, stairs, transfers, bed mobility, and locomotion level   PARTICIPATION LIMITATIONS: cleaning, laundry, interpersonal relationship, driving, and community activity   PERSONAL FACTORS: 3+ comorbidities: Breast augmentation surgeries with follow-up reduction with complication, c/s, gastric bypass, bil tubal ligation, long history of chronic low back/pelvic pain, pudendal neuralgia, coccydynia, Lt glut med repair  are also affecting patient's functional outcome.    REHAB POTENTIAL: Good   CLINICAL DECISION MAKING: Stable/uncomplicated   EVALUATION COMPLEXITY: Low     GOALS: Goals reviewed with patient? Yes   SHORT TERM GOALS: Target date: 03/31/2022 - updated 03/26/22 updated 05/14/22 - updated 06/16/22 - updated 2/26 new target 09/01/2022 - updated 08/21/22 - updated 09/01/22 with new goal 11/11/2022 -c updated 09/30/22 updated 11/05/22 new goal 12/31/22   Pt will be independent with HEP.    Baseline: Goal status: MET 03/26/22   2.  Pt will be independent with diaphragmatic breathing and down training activities in order to improve pelvic floor relaxation and abdominal wall mobility.   Baseline:  Goal status: MET 05/14/22   3.  Pt will report improve stream initiation and complete voiding with urination in order to improve QOL. Baseline: Improving, but still not completely emptying - double voiding is helpful Goal status: IN PROGRESS              4.  Pt will increase all impaired lumbar A/ROM by 25% without pain.   Baseline:  Goal status: MET 11/05/22   LONG TERM GOALS: Target date: 04/28/2022 - updated 03/26/22 - updated 05/14/22 - updated  06/16/22 - updated 06/23/22 new target 09/01/2022 - updated 08/21/22 - updated 09/01/22 with new goal 11/11/2022 - updated 09/30/22 - updated 11/05/22 new goal 12/31/22   Pt will be independent with advanced HEP.    Baseline:  Goal status:IN PROGRESS   2.  Pt will be able to use regular toilet without difficulty. Baseline: She has returned to raised toilet seat 100% of the time Goal status: IN PROGRESS   3.  Pt will be able to ascend/descend  12 steps at regular intervals throughout the day safely and with minimal difficulty.  Baseline: She is able to perform 1 flight of stairs a day, but no more - feels like it is getting easier - down is still harder Goal status: IN PROGRESS   4.  Pt will improve 5xSTS to less than 20 seconds and improved PFIQ by 25% in order to demonstrate improved QOL and functional ability.  Baseline: Increased to 36 seconds on 5x STS, but limited use of Bil UE support during this testing; PFIQ 81% (from 100%) Goal status: IN PROGRESS   5.  Patient will report no greater than 5/10 pain with sitting in order to more easily attend appointments, drive, and spend time with family.  Baseline: Pt has not been down to 5/10 pain yet - lowest pain level she reports is 7/10 Goal status:IN PROGRESS   6.  Pt will demonstrate increase in all impaired hip strength by 1 muscle grades in order to demonstrate improved lumbopelvic support and increase functional ability.      Baseline: Hip strength improving, especially on Rt side; bil adduction and Lt abduction has not seen improvements Goal status: IN PROGRESS  7. Pt will be able to bend over to pick things off of floor and get things out of lower drawers in bathroom/dresser.      Baseline: unable/causing severe pain the rest of the day Goal status: INITIAL   PLAN:   PT FREQUENCY: 1x/week   PT DURATION: 8 weeks   PLANNED INTERVENTIONS: Therapeutic exercises, Therapeutic activity, Neuromuscular re-education, Balance training, Gait  training, Patient/Family education, Self Care, Joint mobilization, Aquatic Therapy, Dry Needling, Biofeedback, and Manual therapy   PLAN FOR NEXT SESSION: Continue to focus on strengthening and mobility program; possible internal pelvic floor exam   Julio Alm, PT, DPT08/03/2409:15 AM

## 2022-12-11 ENCOUNTER — Encounter: Payer: Self-pay | Admitting: Family Medicine

## 2022-12-11 DIAGNOSIS — N83209 Unspecified ovarian cyst, unspecified side: Secondary | ICD-10-CM

## 2022-12-11 NOTE — Telephone Encounter (Signed)
Nurses Please go ahead with referral as planned to the gynecologist mentioned in Katelyn [name]'s note  He will follow-up with them for the pelvic issue as well as breast area thank you

## 2022-12-15 ENCOUNTER — Other Ambulatory Visit: Payer: Self-pay

## 2022-12-15 DIAGNOSIS — D72829 Elevated white blood cell count, unspecified: Secondary | ICD-10-CM

## 2022-12-16 ENCOUNTER — Inpatient Hospital Stay: Payer: PPO | Attending: Physician Assistant

## 2022-12-16 DIAGNOSIS — G5600 Carpal tunnel syndrome, unspecified upper limb: Secondary | ICD-10-CM | POA: Insufficient documentation

## 2022-12-16 DIAGNOSIS — R109 Unspecified abdominal pain: Secondary | ICD-10-CM | POA: Insufficient documentation

## 2022-12-16 DIAGNOSIS — R634 Abnormal weight loss: Secondary | ICD-10-CM | POA: Insufficient documentation

## 2022-12-16 DIAGNOSIS — D7589 Other specified diseases of blood and blood-forming organs: Secondary | ICD-10-CM

## 2022-12-16 DIAGNOSIS — R61 Generalized hyperhidrosis: Secondary | ICD-10-CM | POA: Insufficient documentation

## 2022-12-16 DIAGNOSIS — R232 Flushing: Secondary | ICD-10-CM | POA: Insufficient documentation

## 2022-12-16 DIAGNOSIS — R030 Elevated blood-pressure reading, without diagnosis of hypertension: Secondary | ICD-10-CM | POA: Insufficient documentation

## 2022-12-16 DIAGNOSIS — R6881 Early satiety: Secondary | ICD-10-CM | POA: Insufficient documentation

## 2022-12-16 DIAGNOSIS — G8929 Other chronic pain: Secondary | ICD-10-CM | POA: Insufficient documentation

## 2022-12-16 DIAGNOSIS — E611 Iron deficiency: Secondary | ICD-10-CM | POA: Insufficient documentation

## 2022-12-16 DIAGNOSIS — R700 Elevated erythrocyte sedimentation rate: Secondary | ICD-10-CM | POA: Diagnosis not present

## 2022-12-16 DIAGNOSIS — R112 Nausea with vomiting, unspecified: Secondary | ICD-10-CM | POA: Diagnosis not present

## 2022-12-16 DIAGNOSIS — R5383 Other fatigue: Secondary | ICD-10-CM | POA: Insufficient documentation

## 2022-12-16 DIAGNOSIS — Z86010 Personal history of colonic polyps: Secondary | ICD-10-CM | POA: Insufficient documentation

## 2022-12-16 DIAGNOSIS — D72829 Elevated white blood cell count, unspecified: Secondary | ICD-10-CM | POA: Diagnosis not present

## 2022-12-16 DIAGNOSIS — G629 Polyneuropathy, unspecified: Secondary | ICD-10-CM | POA: Insufficient documentation

## 2022-12-16 LAB — CBC WITH DIFFERENTIAL/PLATELET
Abs Immature Granulocytes: 0.09 10*3/uL — ABNORMAL HIGH (ref 0.00–0.07)
Basophils Absolute: 0.1 10*3/uL (ref 0.0–0.1)
Basophils Relative: 0 %
Eosinophils Absolute: 0.1 10*3/uL (ref 0.0–0.5)
Eosinophils Relative: 1 %
HCT: 44.5 % (ref 36.0–46.0)
Hemoglobin: 14.6 g/dL (ref 12.0–15.0)
Immature Granulocytes: 1 %
Lymphocytes Relative: 17 %
Lymphs Abs: 2.1 10*3/uL (ref 0.7–4.0)
MCH: 32.4 pg (ref 26.0–34.0)
MCHC: 32.8 g/dL (ref 30.0–36.0)
MCV: 98.7 fL (ref 80.0–100.0)
Monocytes Absolute: 0.5 10*3/uL (ref 0.1–1.0)
Monocytes Relative: 4 %
Neutro Abs: 9.8 10*3/uL — ABNORMAL HIGH (ref 1.7–7.7)
Neutrophils Relative %: 77 %
Platelets: 395 10*3/uL (ref 150–400)
RBC: 4.51 MIL/uL (ref 3.87–5.11)
RDW: 13 % (ref 11.5–15.5)
WBC: 12.7 10*3/uL — ABNORMAL HIGH (ref 4.0–10.5)
nRBC: 0 % (ref 0.0–0.2)

## 2022-12-16 LAB — COMPREHENSIVE METABOLIC PANEL
ALT: 21 U/L (ref 0–44)
AST: 19 U/L (ref 15–41)
Albumin: 4.2 g/dL (ref 3.5–5.0)
Alkaline Phosphatase: 98 U/L (ref 38–126)
Anion gap: 7 (ref 5–15)
BUN: 12 mg/dL (ref 6–20)
CO2: 28 mmol/L (ref 22–32)
Calcium: 8.6 mg/dL — ABNORMAL LOW (ref 8.9–10.3)
Chloride: 102 mmol/L (ref 98–111)
Creatinine, Ser: 0.77 mg/dL (ref 0.44–1.00)
GFR, Estimated: >60 mL/min (ref >60)
Glucose, Bld: 98 mg/dL (ref 70–99)
Potassium: 4 mmol/L (ref 3.5–5.1)
Sodium: 137 mmol/L (ref 135–145)
Total Bilirubin: 0.3 mg/dL (ref 0.3–1.2)
Total Protein: 7.2 g/dL (ref 6.5–8.1)

## 2022-12-16 LAB — IRON AND TIBC
Iron: 44 ug/dL (ref 28–170)
Saturation Ratios: 18 % (ref 10.4–31.8)
TIBC: 252 ug/dL (ref 250–450)
UIBC: 208 ug/dL

## 2022-12-16 LAB — SEDIMENTATION RATE: Sed Rate: 37 mm/hr — ABNORMAL HIGH (ref 0–22)

## 2022-12-16 LAB — C-REACTIVE PROTEIN: CRP: 3 mg/dL — ABNORMAL HIGH (ref <1.0)

## 2022-12-16 LAB — FERRITIN: Ferritin: 151 ng/mL (ref 11–307)

## 2022-12-16 LAB — LACTATE DEHYDROGENASE: LDH: 147 U/L (ref 98–192)

## 2022-12-18 ENCOUNTER — Inpatient Hospital Stay: Payer: PPO

## 2022-12-18 DIAGNOSIS — D225 Melanocytic nevi of trunk: Secondary | ICD-10-CM | POA: Diagnosis not present

## 2022-12-18 DIAGNOSIS — L814 Other melanin hyperpigmentation: Secondary | ICD-10-CM | POA: Diagnosis not present

## 2022-12-18 DIAGNOSIS — D485 Neoplasm of uncertain behavior of skin: Secondary | ICD-10-CM | POA: Diagnosis not present

## 2022-12-23 DIAGNOSIS — F900 Attention-deficit hyperactivity disorder, predominantly inattentive type: Secondary | ICD-10-CM | POA: Diagnosis not present

## 2022-12-23 DIAGNOSIS — F331 Major depressive disorder, recurrent, moderate: Secondary | ICD-10-CM | POA: Diagnosis not present

## 2022-12-23 DIAGNOSIS — F411 Generalized anxiety disorder: Secondary | ICD-10-CM | POA: Diagnosis not present

## 2022-12-25 ENCOUNTER — Inpatient Hospital Stay (HOSPITAL_BASED_OUTPATIENT_CLINIC_OR_DEPARTMENT_OTHER): Payer: PPO | Admitting: Oncology

## 2022-12-25 VITALS — BP 123/91 | HR 122 | Temp 99.2°F | Resp 16

## 2022-12-25 DIAGNOSIS — D72829 Elevated white blood cell count, unspecified: Secondary | ICD-10-CM | POA: Diagnosis not present

## 2022-12-25 NOTE — Progress Notes (Signed)
[facility]   INTERVAL HISTORY:  Katelyn [name] is here for follow-up for leukocytosis.  She was last seen in clinic virtually on 07/30/2022.  In the interim, she has been seen by her PCP for uncontrolled high blood pressure.  Pressures have improved since manipulating her medications.  Today, she reports low energy and appetite level 75%.  Has chronic pain all over rates an 8 out of 10.  Spends most of her time laying down because it hurts to sit.  She sees pain management for this and takes oxycodone as needed. Has neuropathy and carpal tunnel syndrome.   Denies any recent infections or changes in medications.  Reports she just wants to know why her white count is elevated.  States since last year she has had malaise, fatigue, dyspnea with exertion, hair loss, intermittent abdominal pain, early satiety , unintentional weight loss and low-grade fevers around 100.0.  White count has been elevated since she had meningitis last summer.  Was evaluated by infectious disease on the hospital but never saw them outpatient.  Interested in following up with them given white count has been persistent since the diagnosis of meningitis.  States she normally gets iron infusions once per year.  Last Venofer was in October/November 2023.  Feels like she may be due soon.  Denies any hematochezia, melena or hemoptysis.  Denies vaginal bleeding.   REVIEW OF SYSTEMS:   Review of Systems  Constitutional:  Positive for diaphoresis, fever, malaise/fatigue and weight loss.  Gastrointestinal:  Positive for nausea and vomiting. Negative for blood in stool, diarrhea and melena.  Neurological:  Positive for dizziness, sensory change, weakness and headaches.  Psychiatric/Behavioral:  The patient is nervous/anxious and has insomnia.      PHYSICAL EXAM: (per limitations of virtual telephone visit)  Physical Exam Constitutional:      Appearance: Normal appearance. She is obese.  Cardiovascular:      Rate and Rhythm: Tachycardia present.  Pulmonary:     Effort: Pulmonary effort is normal.  Neurological:     Mental Status: She is alert and oriented to person, place, and time.      ASSESSMENT & PLAN:  1.  Intermittent leukocytosis - She has had intermittent leukocytosis since at least 2013, neutrophil predominant. - Clinically seems to have some underlying pro-inflammatory state, although previous autoimmune workup at [facility] was negative. - Leukocytosis has been persistent since she had meningitis in June 2023. - She denies any active steroids (reports Symbicort is old prescription, and has not yet used desonide cream) - Non-smoker. - No recent infections. - No masses or lymphadenopathy. - Denies shaking chills, fevers, night sweats. - Reports persistent weight loss although she did not weigh today. - Reports facial flushing and diaphoresis that occurs intermittently, usually when standing. -Labs from 07/31/2022: BCR-ABL FISH negative JAK2 with reflex to CALR, MPL, Exons 12-15 negative. Elevated ESR 40, elevated CRP 3.1 Negative ANA, normal rheumatoid factor. -Workup from 07/30/2022 is suggestive of reactive leukocytosis, thrombocytosis and erythrocytosis from underlying pro inflammatory state. -Given an increase in all 3 major cell lines, we will continue to monitor and would consider bone marrow biopsy if any major deviations from baseline. -Most recent labs from 12/16/2022 show an unremarkable CMP, LDH and iron levels.  Ferritin 151 iron sat 18%.  Hemoglobin 14.6.  CRP continues to be elevated at 3.0.  WBC 12.7 (11.3), ANC 9.8.  Sedimentation rate 37.  -Patient would like a referral to infectious disease given persistent leukocytosis  since meningitis back in June 2023.  We discussed bone marrow biopsy but she would like to hold off at this time.  - PLAN: -Continue monthly CBC with differential. -RTC in 4 months virtually for follow-up with provider and lab work. - Referral to  infectious disease.  2.  Unintentional weight loss & other symptoms - Reported symptoms of general malaise, fatigue, dyspnea on exertion, hair loss, abdominal pain, nausea/vomiting, early satiety, unintentional weight loss, low-grade fevers (100 F), and chest pain. - These symptoms are being worked up by her primary care provider. CTA chest (08/01/2022) negative for PE.  Showed mild respiratory motion, low lung volumes, and atelectasis.  Moderate atrophy of left renal cortex.  Progressed since 2022. CT abdomen/pelvis (08/15/2022): 4.6 cm left-sided uterine fibroid.  Benign-appearing left ovarian cyst measuring 2.4 cm. - Labs from 07/30/2022 showed normal TSH and normal T4. - PLAN:  -Continue workup per primary care provider.  3.  Iron deficiency state:  -Reports she receives IV iron once a year. -Most recent ferritin was 151, with iron saturations of 18%. -She last received IV iron 02/15/23-03/14/22.  -Given extreme fatigue, would recommend 3 doses of IV Venofer 200 mg given 1 week apart from each other. -Will recheck labs in 4 months including B12, folate, MMA, ferritin, iron panel and homocystine. -Had colonoscopy on 11/12/2022 which showed six 4 to 7 mm polyps in sigmoid colon, transverse colon and cecum.  Unable to review pathology results but recommendation was repeat colonoscopy in 3 years.  PLAN SUMMARY: >> Continue monthly CBC with differential. >> Discussed referral to infectious disease. >> Proceed with 3 doses of IV Venofer (200 mg) given at least 1 week apart. >> Virtual visit in 4 months.      I discussed the assessment and treatment plan with the patient. The patient was provided an opportunity to ask questions and all were answered. The patient agreed with the plan and demonstrated an understanding of the instructions.   The patient was advised to call back or seek an in-person evaluation if the symptoms worsen or if the condition fails to improve as anticipated.  I spent 30  minutes dedicated to the care of this patient (face-to-face and non-face-to-face) on the date of the encounter to include what is described in the assessment and plan.  Durenda Hurt, NP 12/25/2022 10:25 AM

## 2023-01-01 ENCOUNTER — Ambulatory Visit: Payer: PPO

## 2023-01-05 ENCOUNTER — Encounter: Payer: Self-pay | Admitting: Internal Medicine

## 2023-01-05 ENCOUNTER — Ambulatory Visit: Payer: PPO | Admitting: Internal Medicine

## 2023-01-05 ENCOUNTER — Encounter: Payer: Self-pay | Admitting: Oncology

## 2023-01-05 ENCOUNTER — Other Ambulatory Visit: Payer: Self-pay

## 2023-01-05 VITALS — BP 136/86 | HR 76 | Resp 16 | Ht 65.0 in | Wt 245.0 lb

## 2023-01-05 DIAGNOSIS — G039 Meningitis, unspecified: Secondary | ICD-10-CM | POA: Diagnosis not present

## 2023-01-05 DIAGNOSIS — D72829 Elevated white blood cell count, unspecified: Secondary | ICD-10-CM

## 2023-01-05 NOTE — Telephone Encounter (Signed)
Please advise 

## 2023-01-05 NOTE — Progress Notes (Signed)
Patient Active Problem List   Diagnosis Date Noted   Essential hypertension 11/04/2022   Panic disorder (episodic paroxysmal anxiety) 07/10/2022   Major depressive disorder, recurrent, moderate (HCC) 07/10/2022   Left renal atrophy 12/19/2021   Stage 2 chronic kidney disease 11/20/2021   GERD (gastroesophageal reflux disease) 09/27/2021   Chronic pain syndrome 09/27/2021   Asthma 07/18/2020   Chronic myofascial pain 03/18/2018   Small fiber neuropathy 03/18/2018   Idiopathic small fiber sensory neuropathy 02/23/2018   Varicose veins of both lower extremities 01/13/2018   Cervical spondylosis without myelopathy 08/28/2017   Migraine with aura and without status migrainosus, not intractable 07/22/2017   Lumbar degenerative disc disease 07/20/2017   Spondylosis of lumbar spine 07/20/2017   Coccydynia 03/12/2017   Bariatric surgery status 05/02/2016   Tachy-brady syndrome (HCC) 12/19/2015   Iron deficiency anemia 06/10/2015   Sleep disorder 06/01/2013   Generalized anxiety disorder 05/30/2013   Depression, major, recurrent, severe with psychosis (HCC) 06/17/2012   Eating disorder 12/18/2011   Constipation 01/20/2011   Polycystic ovarian syndrome 09/01/2008    Patient's Medications  New Prescriptions   No medications on file  Previous Medications   ALBUTEROL (VENTOLIN HFA) 108 (90 BASE) MCG/ACT INHALER    Inhale 2 puffs into the lungs every 6 (six) hours as needed for wheezing or shortness of breath.   ALPRAZOLAM (XANAX) 1 MG TABLET    Take 1 mg by mouth as directed. 1/2 to 1 tablet up to two times daily.   BUPROPION (WELLBUTRIN) 75 MG TABLET    Take two tablets (75 mg total dose) by mouth daily.   CALCIUM CITRATE-VITAMIN D 500-12.5 MG-MCG CHEW    Chew 1 each by mouth daily.   CLONAZEPAM (KLONOPIN) 0.5 MG TABLET    TAKE (1) TABLET BY MOUTH TWICE DAILY AS NEEDED FOR ANXIETY.   DESONIDE (DESOWEN) 0.05 % CREAM    Thin amount bid prn when dermatitis flares   GABAPENTIN  (NEURONTIN) 300 MG CAPSULE    Take 600 mg by mouth 4 (four) times daily.   LIDOCAINE (LIDODERM) 5 %    Place 1 patch onto the skin daily. Remove & Discard patch within 12 hours or as directed by MD   LOSARTAN (COZAAR) 50 MG TABLET    Take 1 tablet (50 mg total) by mouth daily.   METHYLPHENIDATE (RITALIN) 10 MG TABLET    Take 10 mg by mouth 2 (two) times daily.   MULTIPLE VITAMINS-MINERALS (BARIATRIC FUSION) CHEW    Chew 1 each by mouth daily.   NALOXONE (NARCAN) NASAL SPRAY 4 MG/0.1 ML    Place 1 spray into the nose once.   NONFORMULARY OR COMPOUNDED ITEM    Apply 1 to 2 grams (1 gram = 1 pump) to the affected area 3 to 4 times daily   NYSTATIN (MYCOSTATIN/NYSTOP) POWDER    Apply 1 Application topically 2 (two) times daily.   OMEPRAZOLE (PRILOSEC) 20 MG CAPSULE    Take 1 capsule (20 mg total) by mouth daily as needed (acid reflux).   ONDANSETRON (ZOFRAN) 8 MG TABLET    TAKE 1 TABLET EVERY 8 HOURS AS NEEDED FOR NAUSEA.   OXYCODONE HCL 10 MG TABS    Take 10 mg by mouth 4 (four) times daily as needed.   SUCRALFATE (CARAFATE) 1 G TABLET    Take 1 tablet (1 g total) by mouth 4 (four) times daily -  with meals and at bedtime.   TEMAZEPAM (  RESTORIL) 30 MG CAPSULE    Take 1 capsule (30 mg total) by mouth at bedtime.   TIZANIDINE (ZANAFLEX) 4 MG TABLET    Take 4 mg by mouth every 8 (eight) hours as needed for muscle spasms.   ZIPRASIDONE (GEODON) 80 MG CAPSULE    Take 1 capsule (80 mg total) by mouth at bedtime.  Modified Medications   No medications on file  Discontinued Medications   No medications on file    Subjective: 47 YF with complicated PMHX including, GAD, Anorexia, PCOS, possible viral meningitis in 2023 referred to ID for chronic leukocytosis. She was offered to get bone Bx, but wanted to be seen by ID.  She was admitted to [facility] 6/2 - 6/6 for meningitis.  Found to have nausea, headaches, fever x3 days prior to admission.  She also had erythematous papular rash on her trunk and  periorbital region.  Her LP showed 32 WBC, 8 RBC, protein 64.  Work-up including HIV ,RMSF was negative.  There was insufficient sample from the LP to do ME panel, HSV, VZV as that she was empirically treated with acyclovir inpatient and discharged on Valtrex to complete 2 weeks of antiviral therapy eot 6/15. Arbovirus panel and VDRL negative. On 6/19 ID visit Pt repored occipital and parietal HA continues although improved. She has had a long history of HA starting form ECT treatments, now has additional stress(husband ill) which is likely contributing to HA rather than an infectious etiology. No photophobia, full ROM of neck. F.U wit ID PRN. Today 01/05/23: Pt is anxious. Has had on and off HA but not botercome. She had a temp of 1Feels fatigued. 00.6 once this month. Followed by heme.onc for anemia, gets fe infouson No abdominal pain,  She states she gets dizzy when she stands up.  Review of Systems: Review of Systems  All other systems reviewed and are negative.   Past Medical History:  Diagnosis Date   Anemia    Anorexia    Anxiety    on meds   Asthma    controlled with meds   Benign juvenile melanoma    Chronic headaches    Colon polyps    found on colonoscopy 04/26/2012   Complication of anesthesia    itching after epidural for c section   Constipation    Depression    on meds   Depression    several suicide attempts, hospitaluzed in 2012 for this; hx pf ECT treatments ; pt sees Dr. Lolly Mustache pyschiatrist  and doing well on medication   Dysrhythmia    hx of PVC's (pt hasn't seen cardiologist since 2021- no follow up needed)   GERD (gastroesophageal reflux disease)    Headache    Heart murmur    as a child - no one mentions hearing murmur anymore, sometimes it is heard. Echo has been performed 02/03/18   Heartburn    no meds   History of pneumonia    x 3  years ago - no recent problems   Hx of blood clots 2019   bilateral LE   Left renal atrophy 12/19/2021   LEFT    Neuromuscular disorder (HCC)    small fiber neuropathy   Obesity    Pneumonia    Polycystic ovary    takes metformin to treat   Skin lesion    Excisional biopsy of moles - none cancerous   Sleep apnea    yrs ago - diagnosed mild sleep apnea - did not have  to use cpap     Social History   Tobacco Use   Smoking status: Former    Current packs/day: 0.00    Average packs/day: 1 pack/day for 5.3 years (5.3 ttl pk-yrs)    Types: Cigarettes    Start date: 29    Quit date: 08/26/1997    Years since quitting: 25.3   Smokeless tobacco: Never  Vaping Use   Vaping status: Never Used  Substance Use Topics   Alcohol use: No    Alcohol/week: 0.0 standard drinks of alcohol   Drug use: No    Family History  Problem Relation Age of Onset   Hypercholesterolemia Mother    Hypertension Mother        Iterstitial Cystist   Hyperlipidemia Mother    Breast cancer Mother 44   Colon polyps Father    Depression Father    Irritable bowel syndrome Father    Alcohol abuse Father    Depression Brother    Alcohol abuse Brother    Colon cancer Paternal Aunt 35   Rectal cancer Paternal Aunt 35   Colon polyps Paternal Aunt 78   Cancer Maternal Grandfather        unknown type   Heart attack Paternal Grandfather    Kidney cancer Paternal Grandfather    Esophageal cancer Neg Hx    Stomach cancer Neg Hx     Allergies  Allergen Reactions   Ciprofloxacin Other (See Comments)    Nerve pain     Lyrica [Pregabalin] Anaphylaxis   Duloxetine Hcl     Make her more depressed, having nausea, vomiting, headache and threw up.    Feraheme [Ferumoxytol] Hives   Injectafer [Ferric Carboxymaltose] Other (See Comments)    Fevers   Cyanocobalamin     Injectable only - numbness, tingling, muscle cramping, and heart palpations    Penicillins     Has patient had a PCN reaction causing immediate rash, facial/tongue/throat swelling, SOB or lightheadedness with hypotension: Yes Has patient had a PCN reaction  causing severe rash involving mucus membranes or skin necrosis: No Has patient had a PCN reaction that required hospitalization No Has patient had a PCN reaction occurring within the last 10 years: No If all of the above answers are "NO", then may proceed with Cephalosporin use.     REACTION: Rash    Health Maintenance  Topic Date Due   PAP SMEAR-Modifier  09/06/2017   COVID-19 Vaccine (3 - Moderna risk series) 09/09/2019   DTaP/Tdap/Td (2 - Td or Tdap) 03/30/2021   INFLUENZA VACCINE  01/05/2023 (Originally 11/27/2022)   Medicare Annual Wellness (AWV)  10/01/2023   Colonoscopy  11/11/2025   Hepatitis C Screening  Completed   HIV Screening  Completed   HPV VACCINES  Aged Out    Objective:  There were no vitals filed for this visit. There is no height or weight on file to calculate BMI.  Physical Exam Constitutional:      Appearance: Normal appearance.  HENT:     Head: Normocephalic and atraumatic.     Right Ear: Tympanic membrane normal.     Left Ear: Tympanic membrane normal.     Nose: Nose normal.     Mouth/Throat:     Mouth: Mucous membranes are moist.  Eyes:     Extraocular Movements: Extraocular movements intact.     Conjunctiva/sclera: Conjunctivae normal.     Pupils: Pupils are equal, round, and reactive to light.  Cardiovascular:     Rate and Rhythm:  Normal rate and regular rhythm.     Heart sounds: No murmur heard.    No friction rub. No gallop.  Pulmonary:     Effort: Pulmonary effort is normal.     Breath sounds: Normal breath sounds.  Abdominal:     General: Abdomen is flat.     Palpations: Abdomen is soft.  Musculoskeletal:        General: Normal range of motion.  Skin:    General: Skin is warm and dry.  Neurological:     General: No focal deficit present.     Mental Status: She is alert and oriented to person, place, and time.  Psychiatric:        Mood and Affect: Mood normal.     Lab Results Lab Results  Component Value Date   WBC 12.7  (H) 12/16/2022   HGB 14.6 12/16/2022   HCT 44.5 12/16/2022   MCV 98.7 12/16/2022   PLT 395 12/16/2022    Lab Results  Component Value Date   CREATININE 0.77 12/16/2022   BUN 12 12/16/2022   NA 137 12/16/2022   K 4.0 12/16/2022   CL 102 12/16/2022   CO2 28 12/16/2022    Lab Results  Component Value Date   ALT 21 12/16/2022   AST 19 12/16/2022   ALKPHOS 98 12/16/2022   BILITOT 0.3 12/16/2022    Lab Results  Component Value Date   CHOL 178 02/10/2022   HDL 58 02/10/2022   LDLCALC 99 02/10/2022   TRIG 117 02/10/2022   CHOLHDL 3.1 02/10/2022   Lab Results  Component Value Date   LABRPR NON REACTIVE 09/28/2021   No results found for: "HIV1RNAQUANT", "HIV1RNAVL", "CD4TABS"   Problem List Items Addressed This Visit   None  Assessment/Plan #Leukocytosis-persistent #Meningitis 2/2 likely viral etiology #GAD and MDD SP ECT->pt states 10 years ago -Pt has persistent leukocytosis since 5/23 as such unlikly related to piror Hx of meningitis.I discussed that I agree with BM biopsy. Pt has no fevers/ chills or focal Sx. She is followed by onc. Noted on 4/24 that pt seems to have pro-inflammatory state(previous [facility] w/u). Also, noted increase in all three major cell line with plan to watch closely and considerBM Bx if major deviation for baseline.  Plan: -Pt is going to F/U with onc, given she has had persistent luekocystosis ->would agree with BM Bx if feesible.  -HA continues/worsens then we can consider  MRI brain -Labs today -F/U 3 months  Danelle Earthly, MD [facility] 01/05/2023, 9:37 AM   I have personally spent 68 minutes involved in face-to-face and non-face-to-face activities for this patient on the day of the visit. Professional time spent includes the following activities: Preparing to see the patient (review of tests), Obtaining and/or reviewing separately obtained history (admission/discharge record), Performing a  medically appropriate examination and/or evaluation , Ordering medications/tests/procedures, referring and communicating with other health care professionals, Documenting clinical information in the EMR, Independently interpreting results (not separately reported), Communicating results to the patient/family/caregiver, Counseling and educating the patient/family/caregiver and Care coordination (not separately reported).

## 2023-01-06 ENCOUNTER — Other Ambulatory Visit (HOSPITAL_COMMUNITY): Payer: Self-pay | Admitting: Oncology

## 2023-01-06 ENCOUNTER — Ambulatory Visit: Payer: PPO | Attending: Urology

## 2023-01-06 ENCOUNTER — Encounter: Payer: Self-pay | Admitting: Hematology

## 2023-01-06 DIAGNOSIS — G8929 Other chronic pain: Secondary | ICD-10-CM | POA: Diagnosis not present

## 2023-01-06 DIAGNOSIS — M25512 Pain in left shoulder: Secondary | ICD-10-CM | POA: Insufficient documentation

## 2023-01-06 DIAGNOSIS — R279 Unspecified lack of coordination: Secondary | ICD-10-CM | POA: Diagnosis not present

## 2023-01-06 DIAGNOSIS — M6281 Muscle weakness (generalized): Secondary | ICD-10-CM

## 2023-01-06 DIAGNOSIS — M25552 Pain in left hip: Secondary | ICD-10-CM | POA: Diagnosis not present

## 2023-01-06 DIAGNOSIS — M62838 Other muscle spasm: Secondary | ICD-10-CM | POA: Diagnosis not present

## 2023-01-06 DIAGNOSIS — R293 Abnormal posture: Secondary | ICD-10-CM | POA: Diagnosis not present

## 2023-01-06 DIAGNOSIS — D72829 Elevated white blood cell count, unspecified: Secondary | ICD-10-CM
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
## 2023-01-06 NOTE — Telephone Encounter (Signed)
Yes ma'am! 

## 2023-01-09 ENCOUNTER — Inpatient Hospital Stay: Payer: PPO

## 2023-01-11 LAB — CULTURE, BLOOD (SINGLE)
MICRO NUMBER:: 15440946
Result:: NO GROWTH
SPECIMEN QUALITY:: ADEQUATE

## 2023-01-12 DIAGNOSIS — F331 Major depressive disorder, recurrent, moderate: Secondary | ICD-10-CM | POA: Diagnosis not present

## 2023-01-12 DIAGNOSIS — F411 Generalized anxiety disorder: Secondary | ICD-10-CM | POA: Diagnosis not present

## 2023-01-15 ENCOUNTER — Other Ambulatory Visit: Payer: Self-pay | Admitting: Family Medicine

## 2023-01-15 ENCOUNTER — Encounter: Payer: Self-pay | Admitting: Family Medicine

## 2023-01-15 MED ORDER — MOLNUPIRAVIR EUA 200MG CAPSULE: 4.0000 | ORAL_CAPSULE | Freq: Two times a day (BID) | ORAL | 0 refills | Status: AC

## 2023-01-15 NOTE — Progress Notes (Signed)
Patient is concerned that she might get COVID over the weekend and not have anything to take Paxil bid not indicated please see MyChart message she will pick up the prescription if she is interested

## 2023-01-16 ENCOUNTER — Inpatient Hospital Stay: Payer: PPO

## 2023-01-23 ENCOUNTER — Inpatient Hospital Stay: Payer: PPO

## 2023-01-26 ENCOUNTER — Inpatient Hospital Stay: Payer: PPO | Attending: Physician Assistant

## 2023-01-26 ENCOUNTER — Other Ambulatory Visit: Payer: Self-pay | Admitting: Radiology

## 2023-01-26 DIAGNOSIS — D72829 Elevated white blood cell count, unspecified: Secondary | ICD-10-CM

## 2023-01-26 DIAGNOSIS — F5001 Anorexia nervosa, restricting type: Secondary | ICD-10-CM | POA: Diagnosis not present

## 2023-01-26 DIAGNOSIS — F410 Panic disorder [episodic paroxysmal anxiety] without agoraphobia: Secondary | ICD-10-CM | POA: Diagnosis not present

## 2023-01-26 DIAGNOSIS — Z79899 Other long term (current) drug therapy: Secondary | ICD-10-CM | POA: Diagnosis not present

## 2023-01-26 DIAGNOSIS — F411 Generalized anxiety disorder: Secondary | ICD-10-CM | POA: Diagnosis not present

## 2023-01-26 DIAGNOSIS — F331 Major depressive disorder, recurrent, moderate: Secondary | ICD-10-CM | POA: Diagnosis not present

## 2023-01-26 DIAGNOSIS — D7589 Other specified diseases of blood and blood-forming organs: Secondary | ICD-10-CM

## 2023-01-26 LAB — CBC WITH DIFFERENTIAL/PLATELET
Abs Immature Granulocytes: 0.12 10*3/uL — ABNORMAL HIGH (ref 0.00–0.07)
Basophils Absolute: 0.1 10*3/uL (ref 0.0–0.1)
Basophils Relative: 0 %
Eosinophils Absolute: 0.1 10*3/uL (ref 0.0–0.5)
Eosinophils Relative: 1 %
HCT: 41.9 % (ref 36.0–46.0)
Hemoglobin: 13.7 g/dL (ref 12.0–15.0)
Immature Granulocytes: 1 %
Lymphocytes Relative: 17 %
Lymphs Abs: 2.9 10*3/uL (ref 0.7–4.0)
MCH: 32.1 pg (ref 26.0–34.0)
MCHC: 32.7 g/dL (ref 30.0–36.0)
MCV: 98.1 fL (ref 80.0–100.0)
Monocytes Absolute: 0.9 10*3/uL (ref 0.1–1.0)
Monocytes Relative: 5 %
Neutro Abs: 12.9 10*3/uL — ABNORMAL HIGH (ref 1.7–7.7)
Neutrophils Relative %: 76 %
Platelets: 426 10*3/uL — ABNORMAL HIGH (ref 150–400)
RBC: 4.27 MIL/uL (ref 3.87–5.11)
RDW: 12.8 % (ref 11.5–15.5)
WBC: 17 10*3/uL — ABNORMAL HIGH (ref 4.0–10.5)
nRBC: 0 % (ref 0.0–0.2)
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
## 2023-01-27 ENCOUNTER — Encounter: Payer: Self-pay | Admitting: Oncology

## 2023-01-27 ENCOUNTER — Encounter (HOSPITAL_COMMUNITY): Payer: Self-pay

## 2023-01-27 ENCOUNTER — Ambulatory Visit (HOSPITAL_COMMUNITY)
Admission: RE | Admit: 2023-01-27 | Discharge: 2023-01-27 | Disposition: A | Discharge status: Home / Self Care | Payer: PPO | Source: Ambulatory Visit | Attending: Oncology | Admitting: Oncology

## 2023-01-27 ENCOUNTER — Other Ambulatory Visit: Payer: Self-pay

## 2023-01-27 ENCOUNTER — Other Ambulatory Visit: Payer: Self-pay | Admitting: Radiology

## 2023-01-27 DIAGNOSIS — D649 Anemia, unspecified: Secondary | ICD-10-CM | POA: Insufficient documentation

## 2023-01-27 DIAGNOSIS — Z1379 Encounter for other screening for genetic and chromosomal anomalies: Secondary | ICD-10-CM | POA: Insufficient documentation

## 2023-01-27 DIAGNOSIS — D75839 Thrombocytosis, unspecified: Secondary | ICD-10-CM | POA: Diagnosis not present

## 2023-01-27 DIAGNOSIS — D72829 Elevated white blood cell count, unspecified: Secondary | ICD-10-CM | POA: Insufficient documentation

## 2023-01-27 DIAGNOSIS — I10 Essential (primary) hypertension: Secondary | ICD-10-CM | POA: Insufficient documentation

## 2023-01-27 HISTORY — DX: Essential (primary) hypertension: I10

## 2023-01-27 LAB — CBC WITH DIFFERENTIAL/PLATELET
Abs Immature Granulocytes: 0.08 10*3/uL — ABNORMAL HIGH (ref 0.00–0.07)
Basophils Absolute: 0.1 10*3/uL (ref 0.0–0.1)
Basophils Relative: 1 %
Eosinophils Absolute: 0.1 10*3/uL (ref 0.0–0.5)
Eosinophils Relative: 1 %
HCT: 43.9 % (ref 36.0–46.0)
Hemoglobin: 14.3 g/dL (ref 12.0–15.0)
Immature Granulocytes: 1 %
Lymphocytes Relative: 13 %
Lymphs Abs: 1.7 10*3/uL (ref 0.7–4.0)
MCH: 32.4 pg (ref 26.0–34.0)
MCHC: 32.6 g/dL (ref 30.0–36.0)
MCV: 99.3 fL (ref 80.0–100.0)
Monocytes Absolute: 0.6 10*3/uL (ref 0.1–1.0)
Monocytes Relative: 4 %
Neutro Abs: 10.6 10*3/uL — ABNORMAL HIGH (ref 1.7–7.7)
Neutrophils Relative %: 80 %
Platelets: 413 10*3/uL — ABNORMAL HIGH (ref 150–400)
RBC: 4.42 MIL/uL (ref 3.87–5.11)
RDW: 12.8 % (ref 11.5–15.5)
WBC: 13.1 10*3/uL — ABNORMAL HIGH (ref 4.0–10.5)
nRBC: 0 % (ref 0.0–0.2)

## 2023-01-27 MED ORDER — MIDAZOLAM HCL 2 MG/2ML IJ SOLN
INTRAMUSCULAR | Status: AC
  Filled 2023-01-27: qty 2

## 2023-01-27 MED ORDER — OXYCODONE HCL 5 MG PO TABS
10.0000 mg | ORAL_TABLET | ORAL | Status: DC | PRN
  Administered 2023-01-27: 10 mg via ORAL
  Filled 2023-01-27: qty 2

## 2023-01-27 MED ORDER — FENTANYL CITRATE (PF) 100 MCG/2ML IJ SOLN
INTRAMUSCULAR | Status: AC | PRN
Start: 2023-01-27 — End: 2023-01-27
  Administered 2023-01-27: 50 ug via INTRAVENOUS
  Administered 2023-01-27: 100 ug via INTRAVENOUS

## 2023-01-27 MED ORDER — SODIUM CHLORIDE 0.9 % IV SOLN: INTRAVENOUS | Status: DC

## 2023-01-27 MED ORDER — FENTANYL CITRATE (PF) 100 MCG/2ML IJ SOLN
INTRAMUSCULAR | Status: AC
  Filled 2023-01-27: qty 2

## 2023-01-27 MED ORDER — MIDAZOLAM HCL 2 MG/2ML IJ SOLN
INTRAMUSCULAR | Status: AC | PRN
Start: 2023-01-27 — End: 2023-01-27
  Administered 2023-01-27 (×2): 1 mg via INTRAVENOUS

## 2023-01-27 NOTE — Discharge Instructions (Signed)
Discharge Instructions:   Please call Interventional Radiology clinic [phone] with any questions or concerns.  You may remove your dressing and shower tomorrow.    Bone Marrow Aspiration and Bone Marrow Biopsy, Adult, Care After This sheet gives you information about how to care for yourself after your procedure. Your health care provider may also give you more specific instructions. If you have problems or questions, contact your health care provider. What can I expect after the procedure? After the procedure, it is common to have: Mild pain and tenderness. Swelling. Bruising. Follow these instructions at home: Puncture site care  Follow instructions from your health care provider about how to take care of the puncture site. Make sure you: Wash your hands with soap and water before and after you change your bandage (dressing). If soap and water are not available, use hand sanitizer. Change your dressing as told by your health care provider. Check your puncture site every day for signs of infection. Check for: More redness, swelling, or pain. Fluid or blood. Warmth. Pus or a bad smell. Activity Return to your normal activities as told by your health care provider. Ask your health care provider what activities are safe for you. Do not lift anything that is heavier than 10 lb (4.5 kg), or the limit that you are told, until your health care provider says that it is safe. Do not drive for 24 hours if you were given a sedative during your procedure. General instructions  Take over-the-counter and prescription medicines only as told by your health care provider. Do not take baths, swim, or use a hot tub until your health care provider approves. Ask your health care provider if you may take showers. You may only be allowed to take sponge baths. If directed, put ice on the affected area. To do this: Put ice in a plastic bag. Place a towel between your skin and the bag. Leave the ice  on for 20 minutes, 2-3 times a day. Keep all follow-up visits as told by your health care provider. This is important. Contact a health care provider if: Your pain is not controlled with medicine. You have a fever. You have more redness, swelling, or pain around the puncture site. You have fluid or blood coming from the puncture site. Your puncture site feels warm to the touch. You have pus or a bad smell coming from the puncture site. Summary After the procedure, it is common to have mild pain, tenderness, swelling, and bruising. Follow instructions from your health care provider about how to take care of the puncture site and what activities are safe for you. Take over-the-counter and prescription medicines only as told by your health care provider. Contact a health care provider if you have any signs of infection, such as fluid or blood coming from the puncture site. This information is not intended to replace advice given to you by your health care provider. Make sure you discuss any questions you have with your health care provider. Document Revised: 08/31/2018 Document Reviewed: 08/31/2018 Elsevier Patient Education  2023 Elsevier Inc.   Moderate Conscious Sedation, Adult, Care After This sheet gives you information about how to care for yourself after your procedure. Your health care provider may also give you more specific instructions. If you have problems or questions, contact your health care provider. What can I expect after the procedure? After the procedure, it is common to have: Sleepiness for several hours. Impaired judgment for several hours. Difficulty with balance. Vomiting if   you eat too soon. Follow these instructions at home: For the time period you were told by your health care provider: Rest. Do not participate in activities where you could fall or become injured. Do not drive or use machinery. Do not drink alcohol. Do not take sleeping pills or medicines that  cause drowsiness. Do not make important decisions or sign legal documents. Do not take care of children on your own. Eating and drinking  Follow the diet recommended by your health care provider. Drink enough fluid to keep your urine pale yellow. If you vomit: Drink water, juice, or soup when you can drink without vomiting. Make sure you have little or no nausea before eating solid foods. General instructions Take over-the-counter and prescription medicines only as told by your health care provider. Have a responsible adult stay with you for the time you are told. It is important to have someone help care for you until you are awake and alert. Do not smoke. Keep all follow-up visits as told by your health care provider. This is important. Contact a health care provider if: You are still sleepy or having trouble with balance after 24 hours. You feel light-headed. You keep feeling nauseous or you keep vomiting. You develop a rash. You have a fever. You have redness or swelling around the IV site. Get help right away if: You have trouble breathing. You have new-onset confusion at home. Summary After the procedure, it is common to feel sleepy, have impaired judgment, or feel nauseous if you eat too soon. Rest after you get home. Know the things you should not do after the procedure. Follow the diet recommended by your health care provider and drink enough fluid to keep your urine pale yellow. Get help right away if you have trouble breathing or new-onset confusion at home. This information is not intended to replace advice given to you by your health care provider. Make sure you discuss any questions you have with your health care provider. Document Revised: 08/12/2019 Document Reviewed: 03/10/2019 Elsevier Patient Education  2023 Elsevier Inc.  

## 2023-01-27 NOTE — Procedures (Signed)
Vascular and Interventional Radiology Procedure Note  Patient: Katelyn [name] DOB: 1975-12-30 Medical Record Number: [id number] Note Date/Time: 01/27/23 9:13 AM   Performing Physician: Roanna Banning, MD Assistant(s): None  Diagnosis: Leukocytosis   Procedure: BONE MARROW ASPIRATION and BIOPSY  Anesthesia: Conscious Sedation Complications: None Estimated Blood Loss: Minimal Specimens: Sent for Pathology  Findings:  Successful CT-guided bone marrow aspiration and biopsy A total of 1 cores were obtained. Hemostasis of the tract was achieved using Manual Pressure.  Plan: Bed rest for 1 hours.  See detailed procedure note with images in PACS. The patient tolerated the procedure well without incident or complication and was returned to Recovery in stable condition.    Roanna Banning, MD [facility] Radiology   Pager. [phone] Clinic. [phone]

## 2023-01-28 LAB — SURGICAL PATHOLOGY

## 2023-02-02 ENCOUNTER — Ambulatory Visit: Payer: PPO | Admitting: Family Medicine

## 2023-02-02 VITALS — BP 136/85 | HR 118 | Temp 99.8°F

## 2023-02-02 DIAGNOSIS — E785 Hyperlipidemia, unspecified: Secondary | ICD-10-CM

## 2023-02-02 DIAGNOSIS — R509 Fever, unspecified: Secondary | ICD-10-CM

## 2023-02-02 DIAGNOSIS — I10 Essential (primary) hypertension: Secondary | ICD-10-CM

## 2023-02-02 DIAGNOSIS — Z23 Encounter for immunization: Secondary | ICD-10-CM | POA: Diagnosis not present

## 2023-02-02 DIAGNOSIS — R270 Ataxia, unspecified: Secondary | ICD-10-CM | POA: Diagnosis not present

## 2023-02-02 DIAGNOSIS — B354 Tinea corporis: Secondary | ICD-10-CM | POA: Diagnosis not present

## 2023-02-02 DIAGNOSIS — R739 Hyperglycemia, unspecified: Secondary | ICD-10-CM | POA: Diagnosis not present

## 2023-02-02 MED ORDER — KETOCONAZOLE 2 % EX CREA: 1.0000 | TOPICAL_CREAM | Freq: Two times a day (BID) | CUTANEOUS | 4 refills | Status: DC

## 2023-02-02 MED ORDER — GRISEOFULVIN MICROSIZE 500 MG PO TABS: ORAL_TABLET | ORAL | 0 refills | Status: DC

## 2023-02-02 MED ORDER — ONDANSETRON HCL 8 MG PO TABS: 8.0000 mg | ORAL_TABLET | Freq: Three times a day (TID) | ORAL | 5 refills | Status: AC | PRN

## 2023-02-02 NOTE — Progress Notes (Signed)
Subjective:    Patient ID: Katelyn [name], female    DOB: 1976-04-13, 47 y.o.   MRN: [id number]  Discussed the use of AI scribe software for clinical note transcription with the patient, who gave verbal consent to proceed.  History of Present Illness   The patient, with a complex medical history, presented with multiple concerns. The most pressing issue was the recent painful bone marrow biopsy, performed under interventional radiology without anesthesia. She expressed a strong desire for anesthesia if a similar procedure is required in the future.  The patient reported persistent headaches, increased stumbling, and dizziness, which led to a significant fall in the bathroom. These symptoms have raised concerns about balance and coordination, prompting a recommendation for a brain MRI from an infectious disease specialist. She also reported increased autonomic symptoms related to small fiber neuropathy, including reflux, constipation, loose stools, and difficulty swallowing.  The patient has been experiencing significant pain in the right hip and buttock region, which a physical therapist suggested might be soft tissue related. She also reported a persistent yeast infection under the belly, which has been resistant to nystatin powder and cream. She requested a refill of Zofran for nausea, which is more prevalent in the mornings.  The patient also reported foot pain, particularly in the right foot, and changes in the toenails suggestive of a fungal infection. She has been applying Aquaphor to the foot, which has provided some relief. She also reported intermittent fevers and requested a flu shot.  The patient expressed frustration with perceived misinterpretations of her symptoms as anxiety by some healthcare providers. She clarified that her need to stand or move frequently during consultations is due to pain, not anxiety. She also expressed concerns about potential interactions between her  current medications and potential treatments for her various symptoms.         Review of Systems     Objective:    Physical Exam   VITALS: BP- 110/?, P- 100.4 HEENT: Right ear with wax, eardrum normal. Left ear clear, eardrum normal. Throat no abnormalities. CHEST: Lungs clear. CARDIOVASCULAR: Heart rate approximately 110 bpm standing, 80s lying down. No murmurs, rubs, or gallops. NEUROLOGICAL: Romberg test and finger to nose test normal. Extraocular movements intact. Pupillary response to light normal. SKIN: Yeast infection under abdominal folds and under breasts, significant irritation and odor. Nystatin powder and cream in use.           Assessment & Plan:  Assessment and Plan    Headaches and Dizziness Reports of constant headaches, stumbling, and dizziness. History of falling due to imbalance. Recommendation for MRI of the brain from another provider. -Message infectious disease specialist for input on MRI recommendation.  Right Hip and Buttock Pain Reports of pain in the right hip and buttock region. Pain possibly related to laying on the right side due to recent surgery. Recommendation for ultrasound from physical therapist. -Consider ultrasound if pain persists.  Tinea Pedis Reports of foot pain and concern about possible fungal infection. Previous treatment with Lamisil. -Start Griseofulvin 1 tablet daily for 4 weeks. Monitor for potential interactions with current medications.  Candidiasis Reports of severe yeast infection under the belly and between breasts. Current treatment with Nystatin powder and cream not effective. -Start Fluconazole 150mg  once weekly for 4-6 weeks. -Consider Ketoconazole cream twice daily.  Reflux Reports of reflux when laying on stomach. -Advise against laying on stomach.  Vaccinations Reports of running fevers and sore throat. Request for flu shot. -Administer flu shot today.  Follow-up Reports of autonomic symptoms of small  fiber neuropathy, reflux, constipation, loose stools, and difficulty swallowing. -Schedule follow-up in 3 months. -Order labs for A1c and cholesterol. -Consider referral to allergist for penicillin allergy testing.      1. Hyperglycemia Given her tinea corporis as well as intermittent hyperglycemia check A1c - Hemoglobin A1c  2. Hyperlipidemia, unspecified hyperlipidemia type Cholesterol profile ordered - Lipid panel  3. Needs flu shot Flu vaccine today - Flu vaccine trivalent PF, 6mos and older(Flulaval,Afluria,Fluarix,Fluzone)  4. Tinea corporis Griseofulvin 500 mg daily patient relates that she had minimal allergy reaction to penicillin as a child with a rash not anaphylactic she should do fine with the griseofulvin if she was ever interested in doing allergy testing for the presumed penicillin rash we can do so  5. Essential hypertension Blood pressure decent control continue current measures  6. Fever, unspecified fever cause Given that she has these intermittent fevers follow-up with infectious disease Infectious disease specialist mention the possibility of doing an MRI as a follow-up   7. Ataxia Today her finger-nose was good MI was good Romberg negative.  We will message infectious disease doctor to see if they want to pursue doing MRI.  Currently I am not going to order this but have specialist pursue this  If they deem necessary  Very nice patient-I do not feel she is anxious I feel she stands a fair amount with her office visits because of back and leg pain  Follow-up again in 3 to 4 months

## 2023-02-04 ENCOUNTER — Encounter (HOSPITAL_COMMUNITY): Payer: Self-pay

## 2023-02-05 ENCOUNTER — Encounter (HOSPITAL_COMMUNITY): Payer: Self-pay

## 2023-02-10 DIAGNOSIS — F331 Major depressive disorder, recurrent, moderate: Secondary | ICD-10-CM | POA: Diagnosis not present

## 2023-02-10 DIAGNOSIS — F411 Generalized anxiety disorder: Secondary | ICD-10-CM | POA: Diagnosis not present

## 2023-02-11 DIAGNOSIS — E785 Hyperlipidemia, unspecified: Secondary | ICD-10-CM | POA: Diagnosis not present

## 2023-02-11 DIAGNOSIS — R739 Hyperglycemia, unspecified: Secondary | ICD-10-CM | POA: Diagnosis not present

## 2023-02-12 ENCOUNTER — Inpatient Hospital Stay: Payer: PPO | Attending: Physician Assistant

## 2023-02-12 DIAGNOSIS — D509 Iron deficiency anemia, unspecified: Secondary | ICD-10-CM | POA: Insufficient documentation

## 2023-02-12 DIAGNOSIS — D72829 Elevated white blood cell count, unspecified: Secondary | ICD-10-CM | POA: Insufficient documentation

## 2023-02-12 DIAGNOSIS — Z79899 Other long term (current) drug therapy: Secondary | ICD-10-CM | POA: Diagnosis not present

## 2023-02-12 DIAGNOSIS — D7589 Other specified diseases of blood and blood-forming organs: Secondary | ICD-10-CM

## 2023-02-12 LAB — CBC WITH DIFFERENTIAL/PLATELET
Abs Immature Granulocytes: 0.09 10*3/uL — ABNORMAL HIGH (ref 0.00–0.07)
Basophils Absolute: 0.1 10*3/uL (ref 0.0–0.1)
Basophils Relative: 0 %
Eosinophils Absolute: 0.1 10*3/uL (ref 0.0–0.5)
Eosinophils Relative: 1 %
HCT: 45.5 % (ref 36.0–46.0)
Hemoglobin: 14.9 g/dL (ref 12.0–15.0)
Immature Granulocytes: 1 %
Lymphocytes Relative: 16 %
Lymphs Abs: 2.3 10*3/uL (ref 0.7–4.0)
MCH: 32 pg (ref 26.0–34.0)
MCHC: 32.7 g/dL (ref 30.0–36.0)
MCV: 97.8 fL (ref 80.0–100.0)
Monocytes Absolute: 0.6 10*3/uL (ref 0.1–1.0)
Monocytes Relative: 4 %
Neutro Abs: 11.4 10*3/uL — ABNORMAL HIGH (ref 1.7–7.7)
Neutrophils Relative %: 78 %
Platelets: 430 10*3/uL — ABNORMAL HIGH (ref 150–400)
RBC: 4.65 MIL/uL (ref 3.87–5.11)
RDW: 12.8 % (ref 11.5–15.5)
WBC: 14.6 10*3/uL — ABNORMAL HIGH (ref 4.0–10.5)
nRBC: 0 % (ref 0.0–0.2)

## 2023-02-12 LAB — HEMOGLOBIN A1C
Est. average glucose Bld gHb Est-mCnc: 108 mg/dL
Hgb A1c MFr Bld: 5.4 % (ref 4.8–5.6)

## 2023-02-12 LAB — LIPID PANEL
Chol/HDL Ratio: 3.9 {ratio} (ref 0.0–4.4)
Cholesterol, Total: 178 mg/dL (ref 100–199)
HDL: 46 mg/dL (ref >39)
LDL Chol Calc (NIH): 105 mg/dL — ABNORMAL HIGH (ref 0–99)
Triglycerides: 154 mg/dL — ABNORMAL HIGH (ref 0–149)
VLDL Cholesterol Cal: 27 mg/dL (ref 5–40)

## 2023-02-13 ENCOUNTER — Inpatient Hospital Stay (HOSPITAL_BASED_OUTPATIENT_CLINIC_OR_DEPARTMENT_OTHER): Payer: PPO | Admitting: Oncology

## 2023-02-13 VITALS — BP 146/97 | HR 115 | Temp 99.7°F | Resp 19

## 2023-02-13 DIAGNOSIS — R7989 Other specified abnormal findings of blood chemistry: Secondary | ICD-10-CM

## 2023-02-13 DIAGNOSIS — D509 Iron deficiency anemia, unspecified: Secondary | ICD-10-CM | POA: Diagnosis not present

## 2023-02-13 DIAGNOSIS — D72829 Elevated white blood cell count, unspecified: Secondary | ICD-10-CM

## 2023-02-13 NOTE — Progress Notes (Unsigned)
[facility]   INTERVAL HISTORY:  [name] is here for follow-up for leukocytosis.  She was last seen in clinic 12/25/2022.  In the interim, she had a bone marrow biopsy.  She is here to review results.  Reports she was evaluated by infectious disease since her last visit who agrees with bone marrow biopsy and does not believe leukocytosis is secondary to previous history of meningitis.  Denies any recurrent fevers.  Has chronic constipation, diarrhea, nausea and shortness of breath.  Has dizziness, headaches, anxiety, depression and sleep problems secondary to chronic pain.  Reports she spends most of her time laying down because it hurts to sit.  Reports many of her health problems started after breast surgery at [facility] in 2018.  Developed coccydynia after breast augmentation surgery followed by chronic neck and low back pain.  She is evaluated by [facility] neurology for presumptive diagnosis of small fiber neuropathy confirmed by punch biopsy per patient.  She takes pain medicines daily and gets intermittent Toradol injections for chronic pain.  Uses ketamine compound cream and takes gabapentin 3 times daily.   More recently she has developed headaches and dizziness and trouble with her balance.  PCP and neurology have recommended an MRI of her brain.  Reports a flare of her autonomic symptoms of small fiber neuropathy and unsure if it is related to your elevated white count.  During her last visit, she was supposed to get 3 doses of IV Venofer which she did not have done because she wanted to make sure it did not interfere with her bone marrow biopsy.  Reports she needs to get this scheduled.  REVIEW OF SYSTEMS:   Review of Systems  Respiratory:  Positive for shortness of breath.   Gastrointestinal:  Positive for constipation, diarrhea and nausea.  Musculoskeletal:  Positive for back pain and joint pain.  Neurological:  Positive for dizziness, sensory change and  headaches.  Psychiatric/Behavioral:  The patient is nervous/anxious and has insomnia.      PHYSICAL EXAM:   Physical Exam Constitutional:      Appearance: Normal appearance. She is obese.  Cardiovascular:     Rate and Rhythm: Normal rate and regular rhythm.  Pulmonary:     Effort: Pulmonary effort is normal.     Breath sounds: Normal breath sounds.  Abdominal:     General: Bowel sounds are normal.     Palpations: Abdomen is soft.  Musculoskeletal:        General: No swelling. Normal range of motion.  Neurological:     Mental Status: She is alert and oriented to person, place, and time. Mental status is at baseline.      ASSESSMENT & PLAN:  1.  Intermittent leukocytosis - She has had intermittent leukocytosis since at least 2013, neutrophil predominant. - Clinically seems to have some underlying pro-inflammatory state, although previous autoimmune workup at [facility] was negative. - Leukocytosis has been persistent since she had meningitis in June 2023. - She denies any active steroids (reports Symbicort is old prescription, and has not yet used desonide cream) - Non-smoker. - No recent infections. - No masses or lymphadenopathy. - Denies shaking chills, fevers, night sweats. - Reports persistent weight loss although she did not weigh today. - Reports facial flushing and diaphoresis that occurs intermittently, usually when standing. -Labs from 07/31/2022: BCR-ABL FISH negative JAK2 with reflex to CALR, MPL, Exons 12-15 negative. Elevated ESR 40, elevated CRP 3.1 Negative ANA, normal rheumatoid factor. -Workup  from 07/30/2022 is suggestive of reactive leukocytosis, thrombocytosis and erythrocytosis from underlying pro inflammatory state.  2.  Unintentional weight loss & other symptoms - Reported symptoms of general malaise, fatigue, dyspnea on exertion, hair loss, abdominal pain, nausea/vomiting, early satiety, unintentional weight loss, low-grade fevers (100 F), and chest pain. -  These symptoms are being worked up by her primary care provider. CTA chest (08/01/2022) negative for PE.  Showed mild respiratory motion, low lung volumes, and atelectasis.  Moderate atrophy of left renal cortex.  Progressed since 2022. CT abdomen/pelvis (08/15/2022): 4.6 cm left-sided uterine fibroid.  Benign-appearing left ovarian cyst measuring 2.4 cm.  3.  Iron deficiency state:  -Reports she receives IV iron once a year. -Most recent ferritin was 151, with iron saturations of 18%. -She last received IV iron 02/14/22-03/14/22.  -Had colonoscopy on 11/12/2022 which showed six 4 to 7 mm polyps in sigmoid colon, transverse colon and cecum.  Pathology of polyps were all tubular adenomas without evidence of dysplasia.  Repeat in 3 years -No active bleeding per patient.   PLAN: 1. Leukocytosis, unspecified type -Labs from 02/12/2023 show a white blood cell count of 14.6.  ANC 11.4. -Bone marrow biopsy from 01/27/2023 showed no significant CD3 4 positive blastic population identified.  T cells with nonspecific changes.  No monoclonal B-cell population identified.  Normocellular bone marrow for age with trilineage hematopoiesis.  There are mild dyspoietic changes involving the granular lytic cell lines with no increase in blastic cells.  The findings are not considered specific or diagnostic of a myeloid neoplasm and may be secondary in nature to infection, immune mediated process or medication.   -We discussed results in detail.   -Previous CRP and sed rate were elevated likely indicating inflammation. -Continue to favor elevated white count secondary to inflammatory state.  2. Elevated platelet count -Most recent platelet count from 02/12/2023 was 430,000. -Continue to monitor.  3. Iron deficiency anemia, unspecified iron deficiency anemia type -Most recent labs from 02/12/2023 show hemoglobin 14.9. -Previously iron saturations were 18% with TIBC 252 ferritin 151. -She was supposed to get 3 doses  of IV Venofer 200 mg but postponed secondary to bone marrow biopsy.  Proceed with 3 doses IV Venofer over the next several weeks. -Discussed follow-up in approximately 3 months or so.  PLAN SUMMARY: >> Proceed with 3 doses of IV Venofer (200 mg) given at least 1 week apart. >> RTC in 4 months with labs a few days before and virtual visit     Addendum: Discussed case with Dr. Ellin Saba who reviewed labs and bone marrow biopsy.  Favors pro inflammatory state likely secondary to underlying small fiber neuropathy verse new autonomic symptoms.  Recommend follow-up with neurology.   I discussed the assessment and treatment plan with the patient. The patient was provided an opportunity to ask questions and all were answered. The patient agreed with the plan and demonstrated an understanding of the instructions.   The patient was advised to call back or seek an in-person evaluation if the symptoms worsen or if the condition fails to improve as anticipated.  I spent 30 minutes dedicated to the care of this patient (face-to-face and non-face-to-face) on the date of the encounter to include what is described in the assessment and plan.  Durenda Hurt, NP 02/13/2023 11:31 AM

## 2023-02-16 ENCOUNTER — Encounter: Payer: Self-pay | Admitting: Oncology

## 2023-02-16 ENCOUNTER — Encounter: Payer: Self-pay | Admitting: Hematology

## 2023-02-17 ENCOUNTER — Ambulatory Visit: Payer: PPO

## 2023-02-18 LAB — CBC WITH DIFFERENTIAL/PLATELET
Absolute Lymphocytes: 1915 {cells}/uL (ref 850–3900)
Absolute Monocytes: 493 {cells}/uL (ref 200–950)
Basophils Absolute: 45 {cells}/uL (ref 0–200)
Basophils Relative: 0.4 %
Eosinophils Absolute: 112 {cells}/uL (ref 15–500)
Eosinophils Relative: 1 %
HCT: 45 % (ref 35.0–45.0)
Hemoglobin: 14.9 g/dL (ref 11.7–15.5)
MCH: 32 pg (ref 27.0–33.0)
MCHC: 33.1 g/dL (ref 32.0–36.0)
MCV: 96.8 fL (ref 80.0–100.0)
MPV: 10 fL (ref 7.5–12.5)
Monocytes Relative: 4.4 %
Neutro Abs: 8635 {cells}/uL — ABNORMAL HIGH (ref 1500–7800)
Neutrophils Relative %: 77.1 %
Platelets: 467 10*3/uL — ABNORMAL HIGH (ref 140–400)
RBC: 4.65 10*6/uL (ref 3.80–5.10)
RDW: 12.5 % (ref 11.0–15.0)
Total Lymphocyte: 17.1 %
WBC: 11.2 10*3/uL — ABNORMAL HIGH (ref 3.8–10.8)

## 2023-02-18 LAB — CULTURE, BLOOD (SINGLE)
MICRO NUMBER:: 15440947
Result:: NO GROWTH
SPECIMEN QUALITY:: ADEQUATE

## 2023-02-18 LAB — AFB CULTURE, BLOOD
MICRO NUMBER:: 15440948
SPECIMEN QUALITY:: ADEQUATE

## 2023-02-18 LAB — COMPLETE METABOLIC PANEL WITH GFR
AG Ratio: 1.7 (calc) (ref 1.0–2.5)
ALT: 23 U/L (ref 6–29)
AST: 22 U/L (ref 10–35)
Albumin: 4.6 g/dL (ref 3.6–5.1)
Alkaline phosphatase (APISO): 112 U/L (ref 31–125)
BUN: 8 mg/dL (ref 7–25)
CO2: 26 mmol/L (ref 20–32)
Calcium: 9.9 mg/dL (ref 8.6–10.2)
Chloride: 102 mmol/L (ref 98–110)
Creat: 0.87 mg/dL (ref 0.50–0.99)
Globulin: 2.7 g/dL (ref 1.9–3.7)
Glucose, Bld: 115 mg/dL — ABNORMAL HIGH (ref 65–99)
Potassium: 4.9 mmol/L (ref 3.5–5.3)
Sodium: 142 mmol/L (ref 135–146)
Total Bilirubin: 0.3 mg/dL (ref 0.2–1.2)
Total Protein: 7.3 g/dL (ref 6.1–8.1)
eGFR: 83 mL/min/{1.73_m2} (ref >=60)

## 2023-02-18 LAB — HEPATITIS PANEL, ACUTE
Hep A IgM: NONREACTIVE
Hep B C IgM: NONREACTIVE
Hepatitis B Surface Ag: NONREACTIVE
Hepatitis C Ab: NONREACTIVE

## 2023-02-18 LAB — HIV ANTIBODY (ROUTINE TESTING W REFLEX): HIV 1&2 Ab, 4th Generation: NONREACTIVE

## 2023-02-20 ENCOUNTER — Inpatient Hospital Stay: Payer: PPO

## 2023-02-20 VITALS — BP 137/83 | HR 86 | Temp 98.7°F | Resp 18

## 2023-02-20 DIAGNOSIS — D72829 Elevated white blood cell count, unspecified: Secondary | ICD-10-CM | POA: Diagnosis not present

## 2023-02-20 DIAGNOSIS — D508 Other iron deficiency anemias: Secondary | ICD-10-CM

## 2023-02-20 MED ORDER — SODIUM CHLORIDE 0.9 % IV SOLN: Freq: Once | INTRAVENOUS | Status: AC

## 2023-02-20 MED ORDER — SODIUM CHLORIDE 0.9% FLUSH
10.0000 mL | Freq: Two times a day (BID) | INTRAVENOUS | Status: DC
  Administered 2023-02-20: 10 mL via INTRAVENOUS

## 2023-02-20 MED ORDER — FAMOTIDINE IN NACL 20-0.9 MG/50ML-% IV SOLN: 20.0000 mg | Freq: Once | INTRAVENOUS | Status: DC

## 2023-02-20 MED ORDER — ACETAMINOPHEN 325 MG PO TABS: 650.0000 mg | ORAL_TABLET | Freq: Once | ORAL | Status: DC

## 2023-02-20 MED ORDER — METHYLPREDNISOLONE SODIUM SUCC 125 MG IJ SOLR: 125.0000 mg | Freq: Once | INTRAMUSCULAR | Status: DC

## 2023-02-20 MED ORDER — CETIRIZINE HCL 10 MG PO TABS: 10.0000 mg | ORAL_TABLET | Freq: Once | ORAL | Status: DC

## 2023-02-20 MED ORDER — IRON SUCROSE 20 MG/ML IV SOLN
200.0000 mg | Freq: Once | INTRAVENOUS | Status: AC
  Administered 2023-02-20: 200 mg via INTRAVENOUS
  Filled 2023-02-20: qty 10

## 2023-02-20 NOTE — Progress Notes (Signed)
Per pt she has already taken her Pepcid PO, Tylenol PO, and Zyretec PO prior to appointment. Pt refuses to take solu-medrol at this time prior to venofer infusion. Boneta Lucks, NP made aware. No new orders given at this time.   Patient tolerated venofer infusion with no complaints voiced.  Peripheral IV site clean and dry with good blood return noted before and after infusion.  Band aid applied.  Pt  observed for 30 minutes post venofer infusion without any complications. VSS with discharge and left in satisfactory condition with no s/s of distress noted. All follow ups as scheduled.   Phoebie [employer]

## 2023-02-20 NOTE — Patient Instructions (Signed)
Iron Sucrose Injection What is this medication? IRON SUCROSE (EYE ern SOO krose) treats low levels of iron (iron deficiency anemia) in people with kidney disease. Iron is a mineral that plays an important role in making red blood cells, which carry oxygen from your lungs to the rest of your body. This medicine may be used for other purposes; ask your health care provider or pharmacist if you have questions. COMMON BRAND NAME(S): Venofer What should I tell my care team before I take this medication? They need to know if you have any of these conditions: Anemia not caused by low iron levels Heart disease High levels of iron in the blood Kidney disease Liver disease An unusual or allergic reaction to iron, other medications, foods, dyes, or preservatives Pregnant or trying to get pregnant Breastfeeding How should I use this medication? This medication is for infusion into a vein. It is given in a hospital or clinic setting. Talk to your care team about the use of this medication in children. While this medication may be prescribed for children as young as 2 years for selected conditions, precautions do apply. Overdosage: If you think you have taken too much of this medicine contact a poison control center or emergency room at once. NOTE: This medicine is only for you. Do not share this medicine with others. What if I miss a dose? Keep appointments for follow-up doses. It is important not to miss your dose. Call your care team if you are unable to keep an appointment. What may interact with this medication? Do not take this medication with any of the following: Deferoxamine Dimercaprol Other iron products This medication may also interact with the following: Chloramphenicol Deferasirox This list may not describe all possible interactions. Give your health care provider a list of all the medicines, herbs, non-prescription drugs, or dietary supplements you use. Also tell them if you smoke,  drink alcohol, or use illegal drugs. Some items may interact with your medicine. What should I watch for while using this medication? Visit your care team regularly. Tell your care team if your symptoms do not start to get better or if they get worse. You may need blood work done while you are taking this medication. You may need to follow a special diet. Talk to your care team. Foods that contain iron include: whole grains/cereals, dried fruits, beans, or peas, leafy green vegetables, and organ meats (liver, kidney). What side effects may I notice from receiving this medication? Side effects that you should report to your care team as soon as possible: Allergic reactions--skin rash, itching, hives, swelling of the face, lips, tongue, or throat Low blood pressure--dizziness, feeling faint or lightheaded, blurry vision Shortness of breath Side effects that usually do not require medical attention (report to your care team if they continue or are bothersome): Flushing Headache Joint pain Muscle pain Nausea Pain, redness, or irritation at injection site This list may not describe all possible side effects. Call your doctor for medical advice about side effects. You may report side effects to FDA at 1-800-FDA-1088. Where should I keep my medication? This medication is given in a hospital or clinic. It will not be stored at home. NOTE: This sheet is a summary. It may not cover all possible information. If you have questions about this medicine, talk to your doctor, pharmacist, or health care provider.  2024 Elsevier/Gold Standard (2022-09-19 00:00:00)

## 2023-02-24 DIAGNOSIS — M533 Sacrococcygeal disorders, not elsewhere classified: Secondary | ICD-10-CM | POA: Diagnosis not present

## 2023-02-24 DIAGNOSIS — Z79891 Long term (current) use of opiate analgesic: Secondary | ICD-10-CM | POA: Diagnosis not present

## 2023-02-24 DIAGNOSIS — M7061 Trochanteric bursitis, right hip: Secondary | ICD-10-CM | POA: Diagnosis not present

## 2023-02-24 DIAGNOSIS — G8929 Other chronic pain: Secondary | ICD-10-CM | POA: Diagnosis not present

## 2023-02-24 DIAGNOSIS — G894 Chronic pain syndrome: Secondary | ICD-10-CM | POA: Diagnosis not present

## 2023-02-24 DIAGNOSIS — M47816 Spondylosis without myelopathy or radiculopathy, lumbar region: Secondary | ICD-10-CM | POA: Diagnosis not present

## 2023-02-24 DIAGNOSIS — M7918 Myalgia, other site: Secondary | ICD-10-CM | POA: Diagnosis not present

## 2023-02-27 ENCOUNTER — Inpatient Hospital Stay: Payer: PPO | Attending: Physician Assistant

## 2023-03-03 DIAGNOSIS — F331 Major depressive disorder, recurrent, moderate: Secondary | ICD-10-CM | POA: Diagnosis not present

## 2023-03-03 DIAGNOSIS — F411 Generalized anxiety disorder: Secondary | ICD-10-CM | POA: Diagnosis not present

## 2023-03-06 ENCOUNTER — Inpatient Hospital Stay: Payer: PPO | Attending: Physician Assistant

## 2023-03-06 VITALS — BP 107/46 | HR 87 | Temp 97.4°F | Resp 18

## 2023-03-06 DIAGNOSIS — D508 Other iron deficiency anemias: Secondary | ICD-10-CM | POA: Diagnosis not present

## 2023-03-06 MED ORDER — IRON SUCROSE 20 MG/ML IV SOLN: 200.0000 mg | Freq: Once | INTRAVENOUS | Status: DC

## 2023-03-06 MED ORDER — SODIUM CHLORIDE 0.9 % IV SOLN
200.0000 mg | Freq: Once | INTRAVENOUS | Status: AC
  Administered 2023-03-06: 200 mg via INTRAVENOUS
  Filled 2023-03-06: qty 10

## 2023-03-06 MED ORDER — SODIUM CHLORIDE 0.9 % IV SOLN: INTRAVENOUS | Status: DC

## 2023-03-06 MED ORDER — IRON SUCROSE 200 MG IVPB - SIMPLE MED
200.0000 mg | Freq: Once | Status: DC
  Filled 2023-03-06: qty 110

## 2023-03-06 MED ORDER — IRON SUCROSE 200 MG IVPB - SIMPLE MED: 200.0000 mg | Freq: Once | Status: DC

## 2023-03-06 NOTE — Patient Instructions (Addendum)
[facility] - A DEPT OF [facility]  Discharge Instructions: Thank you for choosing [facility] to provide your oncology and hematology care.  If you have a lab appointment with the Cancer Center - please note that after April 8th, 2024, all labs will be drawn in the cancer center.  You do not have to check in or register with the main entrance as you have in the past but will complete your check-in in the cancer center.  Wear comfortable clothing and clothing appropriate for easy access to any Portacath or PICC line.   We strive to give you quality time with your provider. You may need to reschedule your appointment if you arrive late (15 or more minutes).  Arriving late affects you and other patients whose appointments are after yours.  Also, if you miss three or more appointments without notifying the office, you may be dismissed from the clinic at the provider's discretion.      For prescription refill requests, have your pharmacy contact our office and allow 72 hours for refills to be completed.    Today you received Venofer 200 mg IV iron infusion.    BELOW ARE SYMPTOMS THAT SHOULD BE REPORTED IMMEDIATELY: *FEVER GREATER THAN 100.4 F (38 C) OR HIGHER *CHILLS OR SWEATING *NAUSEA AND VOMITING THAT IS NOT CONTROLLED WITH YOUR NAUSEA MEDICATION *UNUSUAL SHORTNESS OF BREATH *UNUSUAL BRUISING OR BLEEDING *URINARY PROBLEMS (pain or burning when urinating, or frequent urination) *BOWEL PROBLEMS (unusual diarrhea, constipation, pain near the anus) TENDERNESS IN MOUTH AND THROAT WITH OR WITHOUT PRESENCE OF ULCERS (sore throat, sores in mouth, or a toothache) UNUSUAL RASH, SWELLING OR PAIN  UNUSUAL VAGINAL DISCHARGE OR ITCHING   Items with * indicate a potential emergency and should be followed up as soon as possible or go to the Emergency Department if any problems should occur.  Please show the CHEMOTHERAPY ALERT CARD or IMMUNOTHERAPY ALERT  CARD at check-in to the Emergency Department and triage nurse.  Should you have questions after your visit or need to cancel or reschedule your appointment, please contact [facility] - A DEPT OF [facility] [phone]  and follow the prompts.  Office hours are 8:00 a.m. to 4:30 p.m. Monday - Friday. Please note that voicemails left after 4:00 p.m. may not be returned until the following business day.  We are closed weekends and major holidays. You have access to a nurse at all times for urgent questions. Please call the main number to the clinic [phone] and follow the prompts.  For any non-urgent questions, you may also contact your provider using MyChart. We now offer e-Visits for anyone 23 and older to request care online for non-urgent symptoms. For details visit [email or web address].   Also download the MyChart app! Go to the app store, search "MyChart", open the app, select [place], and log in with your MyChart username and password.

## 2023-03-06 NOTE — Progress Notes (Signed)
Patient presents today for iron infusion. Patient is in satisfactory condition with no new complaints voiced.  Vital signs are stable.  We will proceed with infusion per provider orders.    Peripheral IV started with good blood return pre and post infusion. Patient took pre-meds Tylenol p.o,Zyrtec p.o, and Pepcid p.o at home prior to arrival. Patient refused to take Solu-medrol IV due pt's GI provider recommendation.   Patient received 200 mg Venofer IV push on 02/20/23. Patient stated she had severe side effects headaches, dizziness, and severe abdominal pain and bloating that lasted three days after receiving the IV push iron. Rico Ala and Anola Gurney, [facility] made aware and Rico Ala stated to give IV iron as a IV piggyback instead of the IV push. Patient made aware and verbalized understanding. In the future patient will receive IV iron piggyback per Rico Ala. Pharmacy made aware and updated patient's supportive therapy plan.  Venofer 200 mg given today per MD orders. Tolerated infusion without adverse affects. Vital signs stable. No complaints at this time. Discharged from clinic ambulatory in stable condition. Alert and oriented x 3. F/U with [facility] as scheduled.

## 2023-03-11 ENCOUNTER — Encounter: Payer: Self-pay | Admitting: Hematology

## 2023-03-13 ENCOUNTER — Inpatient Hospital Stay: Payer: PPO

## 2023-03-13 VITALS — BP 119/66 | HR 96 | Temp 97.9°F | Resp 19

## 2023-03-13 DIAGNOSIS — D508 Other iron deficiency anemias: Secondary | ICD-10-CM | POA: Diagnosis not present

## 2023-03-13 MED ORDER — SODIUM CHLORIDE 0.9 % IV SOLN
200.0000 mg | Freq: Once | INTRAVENOUS | Status: AC
  Administered 2023-03-13: 200 mg via INTRAVENOUS
  Filled 2023-03-13: qty 200

## 2023-03-13 MED ORDER — SODIUM CHLORIDE 0.9 % IV SOLN: INTRAVENOUS | Status: DC

## 2023-03-13 NOTE — Patient Instructions (Signed)
 [facility] - A DEPT OF [facility]  Discharge Instructions: Thank you for choosing [facility] to provide your oncology and hematology care.  If you have a lab appointment with the Cancer Center - please note that after April 8th, 2024, all labs will be drawn in the cancer center.  You do not have to check in or register with the main entrance as you have in the past but will complete your check-in in the cancer center.  Wear comfortable clothing and clothing appropriate for easy access to any Portacath or PICC line.   We strive to give you quality time with your provider. You may need to reschedule your appointment if you arrive late (15 or more minutes).  Arriving late affects you and other patients whose appointments are after yours.  Also, if you miss three or more appointments without notifying the office, you may be dismissed from the clinic at the provider's discretion.      For prescription refill requests, have your pharmacy contact our office and allow 72 hours for refills to be completed.    Today you received the following Venofer, return as scheduled.   To help prevent nausea and vomiting after your treatment, we encourage you to take your nausea medication as directed.  BELOW ARE SYMPTOMS THAT SHOULD BE REPORTED IMMEDIATELY: *FEVER GREATER THAN 100.4 F (38 C) OR HIGHER *CHILLS OR SWEATING *NAUSEA AND VOMITING THAT IS NOT CONTROLLED WITH YOUR NAUSEA MEDICATION *UNUSUAL SHORTNESS OF BREATH *UNUSUAL BRUISING OR BLEEDING *URINARY PROBLEMS (pain or burning when urinating, or frequent urination) *BOWEL PROBLEMS (unusual diarrhea, constipation, pain near the anus) TENDERNESS IN MOUTH AND THROAT WITH OR WITHOUT PRESENCE OF ULCERS (sore throat, sores in mouth, or a toothache) UNUSUAL RASH, SWELLING OR PAIN  UNUSUAL VAGINAL DISCHARGE OR ITCHING   Items with * indicate a potential emergency and should be followed up as soon as possible or  go to the Emergency Department if any problems should occur.  Please show the CHEMOTHERAPY ALERT CARD or IMMUNOTHERAPY ALERT CARD at check-in to the Emergency Department and triage nurse.  Should you have questions after your visit or need to cancel or reschedule your appointment, please contact [facility] - A DEPT OF [facility] [phone]  and follow the prompts.  Office hours are 8:00 a.m. to 4:30 p.m. Monday - Friday. Please note that voicemails left after 4:00 p.m. may not be returned until the following business day.  We are closed weekends and major holidays. You have access to a nurse at all times for urgent questions. Please call the main number to the clinic [phone] and follow the prompts.  For any non-urgent questions, you may also contact your provider using MyChart. We now offer e-Visits for anyone 60 and older to request care online for non-urgent symptoms. For details visit [email or web address].   Also download the MyChart app! Go to the app store, search "MyChart", open the app, select [facility], and log in with your MyChart username and password.

## 2023-03-13 NOTE — Progress Notes (Signed)
Patient presents today for Venofer, patient reports taking pre-meds at home. Patient tolerated iron infusion with no complaints voiced. Peripheral IV site clean and dry with good blood return noted before and after infusion. Band aid applied. VSS with discharge and left in satisfactory condition with no s/s of distress noted.

## 2023-03-19 DIAGNOSIS — F331 Major depressive disorder, recurrent, moderate: Secondary | ICD-10-CM | POA: Diagnosis not present

## 2023-03-19 DIAGNOSIS — F411 Generalized anxiety disorder: Secondary | ICD-10-CM | POA: Diagnosis not present

## 2023-03-20 ENCOUNTER — Inpatient Hospital Stay: Payer: PPO

## 2023-04-07 ENCOUNTER — Encounter: Payer: Self-pay | Admitting: Oncology

## 2023-04-07 ENCOUNTER — Encounter: Payer: Self-pay | Admitting: Hematology

## 2023-04-07 DIAGNOSIS — F411 Generalized anxiety disorder: Secondary | ICD-10-CM | POA: Diagnosis not present

## 2023-04-07 DIAGNOSIS — F331 Major depressive disorder, recurrent, moderate: Secondary | ICD-10-CM | POA: Diagnosis not present

## 2023-04-07 NOTE — Telephone Encounter (Signed)
FYI

## 2023-04-09 DIAGNOSIS — F331 Major depressive disorder, recurrent, moderate: Secondary | ICD-10-CM | POA: Diagnosis not present

## 2023-04-09 DIAGNOSIS — F411 Generalized anxiety disorder: Secondary | ICD-10-CM | POA: Diagnosis not present

## 2023-04-09 DIAGNOSIS — F410 Panic disorder [episodic paroxysmal anxiety] without agoraphobia: Secondary | ICD-10-CM | POA: Diagnosis not present

## 2023-04-14 ENCOUNTER — Other Ambulatory Visit: Payer: Self-pay

## 2023-04-14 ENCOUNTER — Encounter: Payer: Self-pay | Admitting: Internal Medicine

## 2023-04-14 ENCOUNTER — Encounter: Payer: Self-pay | Admitting: Hematology

## 2023-04-14 ENCOUNTER — Ambulatory Visit (INDEPENDENT_AMBULATORY_CARE_PROVIDER_SITE_OTHER): Payer: PPO | Admitting: Internal Medicine

## 2023-04-14 VITALS — BP 142/89 | HR 86 | Temp 97.9°F

## 2023-04-14 DIAGNOSIS — R519 Headache, unspecified: Secondary | ICD-10-CM

## 2023-04-14 NOTE — Progress Notes (Signed)
Patient Active Problem List   Diagnosis Date Noted   Essential hypertension 11/04/2022   Panic disorder (episodic paroxysmal anxiety) 07/10/2022   Major depressive disorder, recurrent, moderate (HCC) 07/10/2022   Left renal atrophy 12/19/2021   Stage 2 chronic kidney disease 11/20/2021   GERD (gastroesophageal reflux disease) 09/27/2021   Chronic pain syndrome 09/27/2021   Asthma 07/18/2020   Chronic myofascial pain 03/18/2018   Small fiber neuropathy 03/18/2018   Idiopathic small fiber sensory neuropathy 02/23/2018   Varicose veins of both lower extremities 01/13/2018   Cervical spondylosis without myelopathy 08/28/2017   Migraine with aura and without status migrainosus, not intractable 07/22/2017   Lumbar degenerative disc disease 07/20/2017   Spondylosis of lumbar spine 07/20/2017   Coccydynia 03/12/2017   Bariatric surgery status 05/02/2016   Tachy-brady syndrome (HCC) 12/19/2015   Iron deficiency anemia 06/10/2015   Sleep disorder 06/01/2013   Generalized anxiety disorder 05/30/2013   Depression, major, recurrent, severe with psychosis (HCC) 06/17/2012   Eating disorder 12/18/2011   Constipation 01/20/2011   Polycystic ovarian syndrome 09/01/2008    Patient's Medications  New Prescriptions   No medications on file  Previous Medications   ALBUTEROL (VENTOLIN HFA) 108 (90 BASE) MCG/ACT INHALER    Inhale 2 puffs into the lungs every 6 (six) hours as needed for wheezing or shortness of breath.   ALPRAZOLAM (XANAX) 1 MG TABLET    Take 1 mg by mouth as directed. 1/2 to 1 tablet up to two times daily.   BUPROPION (WELLBUTRIN) 75 MG TABLET    Take two tablets (75 mg total dose) by mouth daily.   CALCIUM CITRATE-VITAMIN D 500-12.5 MG-MCG CHEW    Chew 1 each by mouth daily.   CLONAZEPAM (KLONOPIN) 0.5 MG TABLET    TAKE (1) TABLET BY MOUTH TWICE DAILY AS NEEDED FOR ANXIETY.   DESONIDE (DESOWEN) 0.05 % CREAM    Thin amount bid prn when dermatitis flares   GABAPENTIN  (NEURONTIN) 300 MG CAPSULE    Take 600 mg by mouth 4 (four) times daily.   GRISEOFULVIN (GRIFULVIN V) 500 MG TABLET    1 qd   KETOCONAZOLE (NIZORAL) 2 % CREAM    Apply 1 Application topically 2 (two) times daily.   LIDOCAINE (LIDODERM) 5 %    Place 1 patch onto the skin daily. Remove & Discard patch within 12 hours or as directed by MD   LOSARTAN (COZAAR) 50 MG TABLET    Take 1 tablet (50 mg total) by mouth daily.   METHYLPHENIDATE (RITALIN) 10 MG TABLET    Take 10 mg by mouth 2 (two) times daily.   MULTIPLE VITAMINS-MINERALS (BARIATRIC FUSION) CHEW    Chew 1 each by mouth daily.   NALOXONE (NARCAN) NASAL SPRAY 4 MG/0.1 ML    Place 1 spray into the nose once.   NONFORMULARY OR COMPOUNDED ITEM    Apply 1 to 2 grams (1 gram = 1 pump) to the affected area 3 to 4 times daily   NYSTATIN (MYCOSTATIN/NYSTOP) POWDER    Apply 1 Application topically 2 (two) times daily.   OMEPRAZOLE (PRILOSEC) 20 MG CAPSULE    Take 1 capsule (20 mg total) by mouth daily as needed (acid reflux).   ONDANSETRON (ZOFRAN) 8 MG TABLET    Take 1 tablet (8 mg total) by mouth every 8 (eight) hours as needed for nausea or vomiting.   OXYCODONE HCL 10 MG TABS    Take 10 mg by  mouth 4 (four) times daily as needed.   SUCRALFATE (CARAFATE) 1 G TABLET    Take 1 tablet (1 g total) by mouth 4 (four) times daily -  with meals and at bedtime.   TEMAZEPAM (RESTORIL) 30 MG CAPSULE    Take 1 capsule (30 mg total) by mouth at bedtime.   TIZANIDINE (ZANAFLEX) 4 MG TABLET    Take 4 mg by mouth every 8 (eight) hours as needed for muscle spasms.   ZIPRASIDONE (GEODON) 80 MG CAPSULE    Take 1 capsule (80 mg total) by mouth at bedtime.  Modified Medications   No medications on file  Discontinued Medications   No medications on file    Subjective: 47 YF with complicated PMHX including, GAD, Anorexia, PCOS, possible viral meningitis in 2023 referred to ID for chronic leukocytosis. She was offered to get bone Bx, but wanted to be seen by ID.  She  was admitted to [facility] 6/2 - 6/6 for meningitis.  Found to have nausea, headaches, fever x3 days prior to admission.  She also had erythematous papular rash on her trunk and periorbital region.  Her LP showed 32 WBC, 8 RBC, protein 64.  Work-up including HIV ,RMSF was negative.  There was insufficient sample from the LP to do ME panel, HSV, VZV as that she was empirically treated with acyclovir inpatient and discharged on Valtrex to complete 2 weeks of antiviral therapy eot 6/15. Arbovirus panel and VDRL negative. On 6/19 ID visit Pt repored occipital and parietal HA continues although improved. She has had a long history of HA starting form ECT treatments, now has additional stress(husband ill) which is likely contributing to HA rather than an infectious etiology. No photophobia, full ROM of neck. F.U wit ID PRN.  01/05/23: Pt is anxious. Has had on and off HA but not botercome. She had a temp of 1Feels fatigued. 00.6 once this month. Followed by heme.onc for anemia, gets fe infouson No abdominal pain,  Today 12/17 : Discussed the use of AI scribe software for clinical note transcription with the patient, who gave verbal consent to proceed.  History of Present Illness   The patient, with a history of small fiber neuropathy diagnosed in 2019, presents with worsening headaches since a meningitis episode. The headaches are described as migraines, sometimes milder, and can last for hours. She is associated with sensitivity to light and sound, and are not related to any specific time of day, food, or stress. The patient also reports a sensation of falling while sitting or walking,.  The patient has been on gabapentin since 2018 for neuropathic pain, which has led to significant weight gain. Other medications include Xanax and Wellbutrin. The patient denies current use of Klonopin.        Review of Systems: Review of Systems  All other systems reviewed and are negative.   Past Medical History:   Diagnosis Date   Anemia    Anorexia    Anxiety    on meds   Asthma    controlled with meds   Benign juvenile melanoma    Chronic headaches    Colon polyps    found on colonoscopy 04/26/2012   Complication of anesthesia    itching after epidural for c section   Constipation    Depression    on meds   Depression    several suicide attempts, hospitaluzed in 2012 for this; hx pf ECT treatments ; pt sees Dr. Lolly Mustache pyschiatrist  and doing well  on medication   Dysrhythmia    hx of PVC's (pt hasn't seen cardiologist since 2021- no follow up needed)   GERD (gastroesophageal reflux disease)    Headache    Heart murmur    as a child - no one mentions hearing murmur anymore, sometimes it is heard. Echo has been performed 02/03/18   Heartburn    no meds   History of pneumonia    x 3  years ago - no recent problems   Hx of blood clots 2019   bilateral LE   Hypertension    Left renal atrophy 12/19/2021   LEFT   Neuromuscular disorder (HCC)    small fiber neuropathy   Obesity    Pneumonia    Polycystic ovary    takes metformin to treat   Skin lesion    Excisional biopsy of moles - none cancerous   Sleep apnea    yrs ago - diagnosed mild sleep apnea - did not have to use cpap     Social History   Tobacco Use   Smoking status: Former    Current packs/day: 0.00    Average packs/day: 1 pack/day for 5.3 years (5.3 ttl pk-yrs)    Types: Cigarettes    Start date: 70    Quit date: 08/26/1997    Years since quitting: 25.6   Smokeless tobacco: Never  Vaping Use   Vaping status: Never Used  Substance Use Topics   Alcohol use: No    Alcohol/week: 0.0 standard drinks of alcohol   Drug use: No    Family History  Problem Relation Age of Onset   Hypercholesterolemia Mother    Hypertension Mother        Iterstitial Cystist   Hyperlipidemia Mother    Breast cancer Mother 17   Colon polyps Father    Depression Father    Irritable bowel syndrome Father    Alcohol abuse Father     Depression Brother    Alcohol abuse Brother    Colon cancer Paternal Aunt 35   Rectal cancer Paternal Aunt 35   Colon polyps Paternal Aunt 76   Cancer Maternal Grandfather        unknown type   Heart attack Paternal Grandfather    Kidney cancer Paternal Grandfather    Esophageal cancer Neg Hx    Stomach cancer Neg Hx     Allergies  Allergen Reactions   Ciprofloxacin Other (See Comments)    Nerve pain     Lyrica [Pregabalin] Anaphylaxis   Duloxetine Hcl     Make her more depressed, having nausea, vomiting, headache and threw up.    Feraheme [Ferumoxytol] Hives   Injectafer [Ferric Carboxymaltose] Other (See Comments)    Fevers   Cyanocobalamin     Injectable only - numbness, tingling, muscle cramping, and heart palpations    Penicillins     Has patient had a PCN reaction causing immediate rash, facial/tongue/throat swelling, SOB or lightheadedness with hypotension: Yes Has patient had a PCN reaction causing severe rash involving mucus membranes or skin necrosis: No Has patient had a PCN reaction that required hospitalization No Has patient had a PCN reaction occurring within the last 10 years: No If all of the above answers are "NO", then may proceed with Cephalosporin use.     REACTION: Rash    Health Maintenance  Topic Date Due   Cervical Cancer Screening (HPV/Pap Cotest)  Never done   DTaP/Tdap/Td (2 - Td or Tdap) 03/30/2021   COVID-19  Vaccine (4 - 2024-25 season) 04/04/2023   Medicare Annual Wellness (AWV)  10/01/2023   Colonoscopy  11/11/2025   INFLUENZA VACCINE  Completed   Hepatitis C Screening  Completed   HIV Screening  Completed   HPV VACCINES  Aged Out    Objective:  Vitals:   04/14/23 0844  BP: (!) 142/89  Pulse: 86  Temp: 97.9 F (36.6 C)  TempSrc: Temporal  SpO2: 93%   There is no height or weight on file to calculate BMI.  Physical Exam Constitutional:      Appearance: Normal appearance.  HENT:     Head: Normocephalic and  atraumatic.     Right Ear: Tympanic membrane normal.     Left Ear: Tympanic membrane normal.     Nose: Nose normal.     Mouth/Throat:     Mouth: Mucous membranes are moist.  Eyes:     Extraocular Movements: Extraocular movements intact.     Conjunctiva/sclera: Conjunctivae normal.     Pupils: Pupils are equal, round, and reactive to light.  Cardiovascular:     Rate and Rhythm: Normal rate and regular rhythm.     Heart sounds: No murmur heard.    No friction rub. No gallop.  Pulmonary:     Effort: Pulmonary effort is normal.     Breath sounds: Normal breath sounds.  Abdominal:     General: Abdomen is flat.     Palpations: Abdomen is soft.  Musculoskeletal:        General: Normal range of motion.  Skin:    General: Skin is warm and dry.  Neurological:     General: No focal deficit present.     Mental Status: She is alert and oriented to person, place, and time.  Psychiatric:        Mood and Affect: Mood normal.    Lab Results Lab Results  Component Value Date   WBC 14.6 (H) 02/12/2023   HGB 14.9 02/12/2023   HCT 45.5 02/12/2023   MCV 97.8 02/12/2023   PLT 430 (H) 02/12/2023    Lab Results  Component Value Date   CREATININE 0.87 01/05/2023   BUN 8 01/05/2023   NA 142 01/05/2023   K 4.9 01/05/2023   CL 102 01/05/2023   CO2 26 01/05/2023    Lab Results  Component Value Date   ALT 23 01/05/2023   AST 22 01/05/2023   ALKPHOS 98 12/16/2022   BILITOT 0.3 01/05/2023    Lab Results  Component Value Date   CHOL 178 02/11/2023   HDL 46 02/11/2023   LDLCALC 105 (H) 02/11/2023   TRIG 154 (H) 02/11/2023   CHOLHDL 3.9 02/11/2023   Lab Results  Component Value Date   LABRPR NON REACTIVE 09/28/2021   No results found for: "HIV1RNAQUANT", "HIV1RNAVL", "CD4TABS"   Problem List Items Addressed This Visit   None Visit Diagnoses       Nonintractable headache, unspecified chronicity pattern, unspecified headache type    -  Primary   Relevant Orders   MR BRAIN W WO  CONTRAST (Completed)   Ambulatory referral to Neurology      Results   LABS AFB: negative HIV: negative WBC: 17 ESR: elevated CRP: elevated  PATHOLOGY Bone marrow biopsy: inflammation versus infection Small fiber neuropathy biopsy: positive (2019)      Assessment/Plan #Leukocytosis-persistent # Hx of Meningitis 2/2 likely viral etiology #GAD and MDD SP ECT->pt states 10 years ago -Pt has persistent leukocytosis since 5/23 as such unlikly related to  piror Hx of meningitis.I discussed that I agree with BM biopsy. Pt has no fevers/ chills or focal Sx. She is followed by onc. Noted on 4/24 that pt seems to have pro-inflammatory state(previous [facility] w/u). Also, noted increase in all three major cell line with plan to watch closely and considerBM Bx if major deviation for baseline.  Undergone bone marrow biopsy which showed no significant CD34 positive blastic population identified.  No monoclonal B-cell.  Normocellular bone marrow.  Findings were nonspecific could be secondary to infection versus immune-mediated process versus medication.. - Infectious workup with HIV, hepatitis panel, AFB and blood cultures have been negative. Plan: -HA continue to worsen will get MRI brain. Refer to neurology for HA and equilibrium issues. -Labs today -F/U PRN as infectious work up has been negative(noted that leukocytosis started in September , meningitis episode was in June 2023->unlikely the events are related). Pt plans to continue to f.u with heme/onc    #Equilibrium issues -Reports feeling of falling while stationary. -Include in referral to neurology as above  #Medication review On multiple medications including Xanax, Wellbutrin, and Neurontin. Pharmacy review did not identify likely cause for leukocytosis. -No change in medication regimen.              Danelle Earthly, MD [facility] 04/19/2023, 7:00 PM    I have personally  spent 42 minutes involved in face-to-face and non-face-to-face activities for this patient on the day of the visit. Professional time spent includes the following activities: Preparing to see the patient (review of tests), Obtaining and/or reviewing separately obtained history (admission/discharge record), Performing a medically appropriate examination and/or evaluation , Ordering medications/tests/procedures, referring and communicating with other health care professionals, Documenting clinical information in the EMR, Independently interpreting results (not separately reported), Communicating results to the patient/family/caregiver, Counseling and educating the patient/family/caregiver and Care coordination (not separately reported).

## 2023-04-14 NOTE — Patient Instructions (Addendum)
-  HEADACHES: You are experiencing worsening headaches that sometimes feel like migraines and are associated with sensitivity to light and sound. We have ordered an MRI of your brain to investigate further and will refer you to a neurologist based on the MRI results.  -EQUILIBRIUM ISSUES: You have been feeling a sensation of falling while sitting or walking and have had instances of stumbling. This will be included in your referral to the neurologist.  -ELEVATED WHITE CELL COUNT: Your white cell count remains high, which can indicate inflammation or infection, but your infectious workup was negative. W monitor your white cell count.   -MEDICATION REVIEW: We reviewed your medications, including Xanax, Wellbutrin, and Neurontin, and found no likely cause for your elevated white cell count. There will be no changes to your medication regimen at this time.   INSTRUCTIONS:  Please proceed with the MRI of your brain as ordered. Follow up with the neurologist after the MRI results are available. F/U ID PRN

## 2023-04-16 ENCOUNTER — Encounter: Payer: Self-pay | Admitting: Obstetrics and Gynecology

## 2023-04-16 ENCOUNTER — Other Ambulatory Visit: Payer: Self-pay

## 2023-04-16 ENCOUNTER — Ambulatory Visit (INDEPENDENT_AMBULATORY_CARE_PROVIDER_SITE_OTHER): Payer: PPO | Admitting: Obstetrics and Gynecology

## 2023-04-16 VITALS — BP 152/97 | HR 109 | Wt 245.2 lb

## 2023-04-16 DIAGNOSIS — Z1331 Encounter for screening for depression: Secondary | ICD-10-CM | POA: Diagnosis not present

## 2023-04-16 DIAGNOSIS — R102 Pelvic and perineal pain: Secondary | ICD-10-CM | POA: Diagnosis not present

## 2023-04-16 DIAGNOSIS — G8929 Other chronic pain: Secondary | ICD-10-CM | POA: Diagnosis not present

## 2023-04-16 NOTE — Progress Notes (Unsigned)
NEW GYNECOLOGY PATIENT Patient name: Katelyn [name] MRN [id number]  Date of birth: Jul 04, 1975 Chief Complaint:   Gynecologic Exam     History:  Katelyn [name] is a 47 y.o. G0P0000 being seen today for pain belived due to pudendal neuralgia . Unable to sit. Has been in pelvic floor PT. Since breast surgery she has had issues. Gabapentin has been helping with the outside burning of the labia. Excruciating pelvic pain. States she doesn't need any more medicine  Nerve pain everywhere. Has been there since she woke up from surgery. Feels like her tail born ebut also inner glutes. When she sits for 5 minute s then she will have pain for sveral hours. If she sits for longer amounts of time, the pain will ocnitnue on longer. If she lays on her sides. Then had hip surgery and has to be on her sides. Will have pain when lying on her stomach, which is hte least amoutn of pain. Different sensation with intecourse. Unable to orgasm. Was going to have breast implatns and skin rmoal surgeyr. Seen in pain management clinic. Not able to drive due ot hte pain  Burnign pain with intercourse  Rx: gabapetin and PFPT Was on estrogen cream at one point and didn't see much difference despite use for 1 year  Using the coconut oil and lubrication helps; more natural solutions otherwise she will get an yeast infection  Limits intecourse to 1x a week to limit the pain Having the sensation that needs to pee Getting PT and using the wand at home  Using xanax, not klonipin. Hasn't used in a couple weeks Using tazaadine at least once day - helps a little bit; has been on other muscle relaxers, but has worked the best of all that she has tried  Prior left SI joint injection and saccral injection wtihout impotovement Glute pain - has       Gynecologic History No LMP recorded. Contraception: {method:5051} Last Pap: No results found for: "DIAGPAP", "HPVHIGH", "ADEQPAP"  High Risk  HPV: Positive  Adequacy:  Satisfactory for evaluation, transformation zone component PRESENT  Diagnosis:  Atypical squamous cells of undetermined significance (ASC-US)  Last Mammogram: {NA AND WUJWJXBJ:47829} Last Colonoscopy: {NA AND FAOZHYQM:57846}  Obstetric History OB History  Gravida Para Term Preterm AB Living  0 0 0 0 0   SAB IAB Ectopic Multiple Live Births  0 0 0      Past Medical History:  Diagnosis Date  . Anemia   . Anorexia   . Anxiety    on meds  . Asthma    controlled with meds  . Benign juvenile melanoma   . Chronic headaches   . Colon polyps    found on colonoscopy 04/26/2012  . Complication of anesthesia    itching after epidural for c section  . Constipation   . Depression    on meds  . Depression    several suicide attempts, hospitaluzed in 2012 for this; hx pf ECT treatments ; pt sees Dr. Lolly Mustache pyschiatrist  and doing well on medication  . Dysrhythmia    hx of PVC's (pt hasn't seen cardiologist since 2021- no follow up needed)  . GERD (gastroesophageal reflux disease)   . Headache   . Heart murmur    as a child - no one mentions hearing murmur anymore, sometimes it is heard. Echo has been performed 02/03/18  . Heartburn    no meds  . History of pneumonia    x 3  years ago - no recent problems  . Hx of blood clots 2019   bilateral LE  . Hypertension   . Left renal atrophy 12/19/2021   LEFT  . Neuromuscular disorder (HCC)    small fiber neuropathy  . Obesity   . Pneumonia   . Polycystic ovary    takes metformin to treat  . Skin lesion    Excisional biopsy of moles - none cancerous  . Sleep apnea    yrs ago - diagnosed mild sleep apnea - did not have to use cpap     Past Surgical History:  Procedure Laterality Date  . BREAST CAPSULECTOMY Bilateral 12/13/2020   1 week later pt had hematoma surgery on left breast  . BREAST ENHANCEMENT SURGERY Bilateral 02/11/2017  . BREATH TEK H PYLORI N/A 07/22/2013   Procedure: BREATH TEK H  PYLORI;  Surgeon: Mariella Saa, MD;  Location: [facility];  Service: General;  Laterality: N/A;  . CESAREAN SECTION  2004, 2007   x 2  . COLONOSCOPY  2013  . COLONOSCOPY  2021   MS-F/V-sutab(good)-[facility]  . CYSTOSCOPY WITH URETHRAL DILATATION  age 11   . GASTRIC ROUX-EN-Y N/A 10/31/2013   Procedure: LAPAROSCOPIC ROUX-EN-Y GASTRIC BYPASS WITH UPPER ENDOSCOPY;  Surgeon: Mariella Saa, MD;  Location: [facility];  Service: General;  Laterality: N/A;  . GLUTEUS MINIMUS REPAIR Left 05/28/2021   Procedure: Left GLUTEUS Medius REPAIR;  Surgeon: Huel Cote, MD;  Location: [facility];  Service: Orthopedics;  Laterality: Left;  . ingrown toenail Right    Ingrown nail on great right toe  . kidney stent  08/28/2014  . mole excision     "benign juvenile melanoma" removed from left leg - inner thigh  . POLYPECTOMY     2013  . SKIN LESION EXCISION     back  . TONSILLECTOMY  age 52  . TUBAL LIGATION  2018    Current Outpatient Medications on File Prior to Visit  Medication Sig Dispense Refill  . albuterol (VENTOLIN HFA) 108 (90 Base) MCG/ACT inhaler Inhale 2 puffs into the lungs every 6 (six) hours as needed for wheezing or shortness of breath. (Patient not taking: Reported on 03/13/2023) 8 g 2  . ALPRAZolam (XANAX) 1 MG tablet Take 1 mg by mouth as directed. 1/2 to 1 tablet up to two times daily.    Marland Kitchen buPROPion (WELLBUTRIN) 75 MG tablet Take two tablets (75 mg total dose) by mouth daily. (Patient taking differently: Take 150 mg by mouth daily. Take two tablets (75 mg total dose) by mouth daily.) 60 tablet 0  . Calcium Citrate-Vitamin D 500-12.5 MG-MCG CHEW Chew 1 each by mouth daily.    . clonazePAM (KLONOPIN) 0.5 MG tablet TAKE (1) TABLET BY MOUTH TWICE DAILY AS NEEDED FOR ANXIETY. 60 tablet 0  . desonide (DESOWEN) 0.05 % cream Thin amount bid prn when dermatitis flares (Patient taking differently: Apply 1 Application topically daily as needed. Thin amount bid prn when dermatitis flares) 30 g  2  . gabapentin (NEURONTIN) 300 MG capsule Take 600 mg by mouth 4 (four) times daily.    Marland Kitchen griseofulvin (GRIFULVIN V) 500 MG tablet 1 qd 30 tablet 0  . ketoconazole (NIZORAL) 2 % cream Apply 1 Application topically 2 (two) times daily. 30 g 4  . lidocaine (LIDODERM) 5 % Place 1 patch onto the skin daily. Remove & Discard patch within 12 hours or as directed by MD 30 patch 3  . losartan (COZAAR) 50 MG tablet Take  1 tablet (50 mg total) by mouth daily. 90 tablet 3  . methylphenidate (RITALIN) 10 MG tablet Take 10 mg by mouth 2 (two) times daily.    . Multiple Vitamins-Minerals (BARIATRIC FUSION) CHEW Chew 1 each by mouth daily.    . naloxone (NARCAN) nasal spray 4 mg/0.1 mL Place 1 spray into the nose once.    . NONFORMULARY OR COMPOUNDED ITEM Apply 1 to 2 grams (1 gram = 1 pump) to the affected area 3 to 4 times daily    . nystatin (MYCOSTATIN/NYSTOP) powder Apply 1 Application topically 2 (two) times daily.    Marland Kitchen omeprazole (PRILOSEC) 20 MG capsule Take 1 capsule (20 mg total) by mouth daily as needed (acid reflux). 60 capsule 6  . ondansetron (ZOFRAN) 8 MG tablet Take 1 tablet (8 mg total) by mouth every 8 (eight) hours as needed for nausea or vomiting. 20 tablet 5  . Oxycodone HCl 10 MG TABS Take 10 mg by mouth 4 (four) times daily as needed.    . sucralfate (CARAFATE) 1 g tablet Take 1 tablet (1 g total) by mouth 4 (four) times daily -  with meals and at bedtime. (Patient taking differently: Take 1 g by mouth 4 (four) times daily -  with meals and at bedtime. Only using as needed) 42 tablet 0  . temazepam (RESTORIL) 30 MG capsule Take 1 capsule (30 mg total) by mouth at bedtime. 30 capsule 0  . tiZANidine (ZANAFLEX) 4 MG tablet Take 4 mg by mouth every 8 (eight) hours as needed for muscle spasms.    . ziprasidone (GEODON) 80 MG capsule Take 1 capsule (80 mg total) by mouth at bedtime. 30 capsule 0   No current facility-administered medications on file prior to visit.    Allergies  Allergen  Reactions  . Ciprofloxacin Other (See Comments)    Nerve pain    . Lyrica [Pregabalin] Anaphylaxis  . Duloxetine Hcl     Make her more depressed, having nausea, vomiting, headache and threw up.   [place] [Ferumoxytol] Hives  . Injectafer [Ferric Carboxymaltose] Other (See Comments)    Fevers  . Cyanocobalamin     Injectable only - numbness, tingling, muscle cramping, and heart palpations   . Penicillins     Has patient had a PCN reaction causing immediate rash, facial/tongue/throat swelling, SOB or lightheadedness with hypotension: Yes Has patient had a PCN reaction causing severe rash involving mucus membranes or skin necrosis: No Has patient had a PCN reaction that required hospitalization No Has patient had a PCN reaction occurring within the last 10 years: No If all of the above answers are "NO", then may proceed with Cephalosporin use.     REACTION: Rash    Social History:  reports that she quit smoking about 25 years ago. Her smoking use included cigarettes. She started smoking about 30 years ago. She has a 5.3 pack-year smoking history. She has never used smokeless tobacco. She reports that she does not drink alcohol and does not use drugs.  Family History  Problem Relation Age of Onset  . Hypercholesterolemia Mother   . Hypertension Mother        Iterstitial Cystist  . Hyperlipidemia Mother   . Breast cancer Mother 30  . Colon polyps Father   . Depression Father   . Irritable bowel syndrome Father   . Alcohol abuse Father   . Depression Brother   . Alcohol abuse Brother   . Colon cancer Paternal Aunt 43  .  Rectal cancer Paternal Aunt 39  . Colon polyps Paternal Aunt 76  . Cancer Maternal Grandfather        unknown type  . Heart attack Paternal Grandfather   . Kidney cancer Paternal Grandfather   . Esophageal cancer Neg Hx   . Stomach cancer Neg Hx     The following portions of the patient's history were reviewed and updated as appropriate: allergies,  current medications, past family history, past medical history, past social history, past surgical history and problem list.  Review of Systems Pertinent items noted in HPI and remainder of comprehensive ROS otherwise negative.  Physical Exam:  There were no vitals taken for this visit. Physical Exam Vitals and nursing note reviewed. Exam conducted with a chaperone present.  Constitutional:      Appearance: Normal appearance.  Pulmonary:     Effort: Pulmonary effort is normal.  Abdominal:     Palpations: Abdomen is soft.     Comments: *** [place]  Genitourinary:    General: Normal vulva.     Exam position: Lithotomy position.     Comments: No allodynia around introitus Mild discomfort with qtip of inner lower labia minor and majora  Normal appearing vulva Anal wink not present Right levator ani very tender Right ischiococcygeous slightly tender Right obturator internus slightly tender Left levator ani 1/10 Left ischioccocygeous 4/10 Left obturator internus 1/10    Neurological:     Mental Status: She is alert.       Assessment and Plan:   There are no diagnoses linked to this encounter.   Routine preventative health maintenance measures emphasized. Please refer to After Visit Summary for other counseling recommendations.   Follow-up: No follow-ups on file.      Lorriane Shire, MD Obstetrician & Gynecologist, Faculty Practice [facility] for [employer], [facility]

## 2023-04-17 ENCOUNTER — Ambulatory Visit (HOSPITAL_COMMUNITY): Payer: PPO

## 2023-04-19 ENCOUNTER — Ambulatory Visit (HOSPITAL_COMMUNITY)
Admission: RE | Admit: 2023-04-19 | Discharge: 2023-04-19 | Disposition: A | Discharge status: Home / Self Care | Payer: PPO | Source: Ambulatory Visit | Attending: Internal Medicine | Admitting: Internal Medicine

## 2023-04-19 DIAGNOSIS — R519 Headache, unspecified: Secondary | ICD-10-CM | POA: Diagnosis not present

## 2023-04-19 DIAGNOSIS — Q048 Other specified congenital malformations of brain: Secondary | ICD-10-CM | POA: Diagnosis not present

## 2023-04-19 MED ORDER — GADOBUTROL 1 MMOL/ML IV SOLN
10.0000 mL | Freq: Once | INTRAVENOUS | Status: AC | PRN
  Administered 2023-04-19: 10 mL via INTRAVENOUS

## 2023-04-23 ENCOUNTER — Other Ambulatory Visit: Payer: Self-pay

## 2023-04-23 DIAGNOSIS — D508 Other iron deficiency anemias: Secondary | ICD-10-CM

## 2023-04-23 DIAGNOSIS — D72829 Elevated white blood cell count, unspecified: Secondary | ICD-10-CM

## 2023-04-23 DIAGNOSIS — D509 Iron deficiency anemia, unspecified: Secondary | ICD-10-CM

## 2023-04-23 DIAGNOSIS — R7989 Other specified abnormal findings of blood chemistry: Secondary | ICD-10-CM

## 2023-04-24 ENCOUNTER — Inpatient Hospital Stay: Payer: PPO | Attending: Physician Assistant

## 2023-04-24 DIAGNOSIS — R634 Abnormal weight loss: Secondary | ICD-10-CM | POA: Diagnosis not present

## 2023-04-24 DIAGNOSIS — R0600 Dyspnea, unspecified: Secondary | ICD-10-CM | POA: Insufficient documentation

## 2023-04-24 DIAGNOSIS — D75839 Thrombocytosis, unspecified: Secondary | ICD-10-CM | POA: Diagnosis not present

## 2023-04-24 DIAGNOSIS — D509 Iron deficiency anemia, unspecified: Secondary | ICD-10-CM

## 2023-04-24 DIAGNOSIS — G8929 Other chronic pain: Secondary | ICD-10-CM | POA: Diagnosis not present

## 2023-04-24 DIAGNOSIS — D508 Other iron deficiency anemias: Secondary | ICD-10-CM | POA: Diagnosis not present

## 2023-04-24 DIAGNOSIS — D72829 Elevated white blood cell count, unspecified: Secondary | ICD-10-CM | POA: Insufficient documentation

## 2023-04-24 DIAGNOSIS — R7989 Other specified abnormal findings of blood chemistry: Secondary | ICD-10-CM

## 2023-04-24 LAB — FERRITIN: Ferritin: 216 ng/mL (ref 11–307)

## 2023-04-24 LAB — COMPREHENSIVE METABOLIC PANEL
ALT: 17 U/L (ref 0–44)
AST: 20 U/L (ref 15–41)
Albumin: 4.5 g/dL (ref 3.5–5.0)
Alkaline Phosphatase: 84 U/L (ref 38–126)
Anion gap: 9 (ref 5–15)
BUN: 11 mg/dL (ref 6–20)
CO2: 25 mmol/L (ref 22–32)
Calcium: 9 mg/dL (ref 8.9–10.3)
Chloride: 103 mmol/L (ref 98–111)
Creatinine, Ser: 0.77 mg/dL (ref 0.44–1.00)
GFR, Estimated: >60 mL/min (ref >60)
Glucose, Bld: 106 mg/dL — ABNORMAL HIGH (ref 70–99)
Potassium: 4.1 mmol/L (ref 3.5–5.1)
Sodium: 137 mmol/L (ref 135–145)
Total Bilirubin: 0.6 mg/dL (ref <1.2)
Total Protein: 7.6 g/dL (ref 6.5–8.1)

## 2023-04-24 LAB — CBC WITH DIFFERENTIAL/PLATELET
Abs Immature Granulocytes: 0.07 10*3/uL (ref 0.00–0.07)
Basophils Absolute: 0.1 10*3/uL (ref 0.0–0.1)
Basophils Relative: 0 %
Eosinophils Absolute: 0.1 10*3/uL (ref 0.0–0.5)
Eosinophils Relative: 1 %
HCT: 43.3 % (ref 36.0–46.0)
Hemoglobin: 14.3 g/dL (ref 12.0–15.0)
Immature Granulocytes: 1 %
Lymphocytes Relative: 20 %
Lymphs Abs: 2.6 10*3/uL (ref 0.7–4.0)
MCH: 31.9 pg (ref 26.0–34.0)
MCHC: 33 g/dL (ref 30.0–36.0)
MCV: 96.7 fL (ref 80.0–100.0)
Monocytes Absolute: 0.7 10*3/uL (ref 0.1–1.0)
Monocytes Relative: 5 %
Neutro Abs: 9.4 10*3/uL — ABNORMAL HIGH (ref 1.7–7.7)
Neutrophils Relative %: 73 %
Platelets: 436 10*3/uL — ABNORMAL HIGH (ref 150–400)
RBC: 4.48 MIL/uL (ref 3.87–5.11)
RDW: 13.2 % (ref 11.5–15.5)
WBC: 12.9 10*3/uL — ABNORMAL HIGH (ref 4.0–10.5)
nRBC: 0 % (ref 0.0–0.2)

## 2023-04-24 LAB — IRON AND TIBC
Iron: 31 ug/dL (ref 28–170)
Saturation Ratios: 12 % (ref 10.4–31.8)
TIBC: 256 ug/dL (ref 250–450)
UIBC: 225 ug/dL

## 2023-04-24 LAB — VITAMIN B12: Vitamin B-12: 1115 pg/mL — ABNORMAL HIGH (ref 180–914)

## 2023-04-24 LAB — FOLATE: Folate: 27.6 ng/mL (ref >5.9)

## 2023-04-24 LAB — SEDIMENTATION RATE: Sed Rate: 33 mm/h — ABNORMAL HIGH (ref 0–22)

## 2023-04-24 LAB — C-REACTIVE PROTEIN: CRP: 3 mg/dL — ABNORMAL HIGH (ref <1.0)

## 2023-04-24 LAB — VITAMIN D 25 HYDROXY (VIT D DEFICIENCY, FRACTURES): Vit D, 25-Hydroxy: 48.58 ng/mL (ref 30–100)

## 2023-04-25 LAB — HOMOCYSTEINE: Homocysteine: 8.1 umol/L (ref 0.0–14.5)

## 2023-04-26 LAB — METHYLMALONIC ACID, SERUM: Methylmalonic Acid, Quantitative: 194 nmol/L (ref 0–378)

## 2023-04-27 DIAGNOSIS — F331 Major depressive disorder, recurrent, moderate: Secondary | ICD-10-CM | POA: Diagnosis not present

## 2023-04-27 DIAGNOSIS — F411 Generalized anxiety disorder: Secondary | ICD-10-CM | POA: Diagnosis not present

## 2023-04-30 NOTE — Progress Notes (Signed)
 Virtual Visit via Telephone Note  I connected with Katelyn [name] on 04/30/23 at  8:30 AM EST by telephone and verified that I am speaking with the correct person using two identifiers.  Location: Patient: Home Provider: Clinic    I discussed the limitations, risks, security and privacy concerns of performing an evaluation and management service by telephone and the availability of in person appointments. I also discussed with the patient that there may be a patient responsible charge related to this service. The patient expressed understanding and agreed to proceed.  History of Present Illness: Patient is a 48 year old female who is followed by cancer center for leukocytosis.  She has past medical history significant for GAD, anorexia, PCOS, headaches, pelvic floor dysfunction, hip pain and low back pain and small fiber neuropathy.  She was last seen in clinic on 02/13/2023.  Since her last visit, she denies any hospitalizations, surgeries or changes to her baseline health.  Patient did meet with infectious disease on 04/14/2023 for follow-up for viral meningitis, persistent leukocytosis and headaches.  Per note, it appears that ID does not feel that persistent leukocytosis is related to previous history of meningitis.  She met with gynecology on 04/16/2023 for pudendal neuralgia and excruciating pelvic pain.  They discussed pelvic floor Botox versus vaginal Valium  but she would have to discontinue oral benzodiazepines which she was okay with.  MD is concerned for Botox given poor reactions to medications in the past.  Patient was seen by [facility] surgery by Dr. Lucillie (02/24/2023) for spondylosis of lumbar spine.  She has received previous injections in the past but these were held secondary to elevated white count.  Previously they have discussed spinal cord stimulator but they recommend injections first.  She is currently on narcotics for these pains.  Had MRI of brain on  04/19/23 for dizziness, headaches and balance concerns which showed no evidence of an acute intracranial abnormality. Cerebellar tonsillar ectopia as described and unchanged from the prior MRI of 12/07/2017. Otherwise unremarkable MRI appearance of the brain.  She had a bone marrow biopsy (01/27/2023) which showed no significant CD34 positive blastic population.  No monoclonal B cells.  Normocellular bone marrow.  Findings were nonspecific could be secondary to infection versus immune mediated process versus medication.  There was no identifiable medication to cause persistent leukocytosis.  Reports low-grade fevers intermittently.  She is no longer taking Tylenol .  Has chronic constipation, diarrhea, nausea and shortness of breath.  Has dizziness, headaches, anxiety, depression and sleep problems secondary to chronic pain.  Reports she spends most of her time laying down because it hurts to sit.  States everything was normal before her hip surgery back in January 2023. Since then her WBC have been elevated.  Reports left hip pain improved postsurgery but symptoms have slowly started to return.  Now her right hip is hurting because she is forced to lay on that side because it hurts to lay on the left side.  Reports some sort of mesh was placed during surgery and she is wondering if it is infected.  She is interested in imaging.  She was referred back to [facility] for small fiber neuropathy and sees them at the end of April.   Observations/Objective:  Review of Systems  Constitutional:  Positive for malaise/fatigue. Negative for chills and fever.  Gastrointestinal:  Positive for constipation. Negative for abdominal pain, blood in stool, diarrhea and melena.  Musculoskeletal:  Positive for back pain, falls, joint pain and  myalgias.  Neurological:  Positive for dizziness, tingling, sensory change and headaches.  Psychiatric/Behavioral:  Positive for depression. The patient is nervous/anxious and has  insomnia.    Physical Exam Neurological:     Mental Status: She is alert and oriented to person, place, and time.     Assessment and Plan: 1. Leukocytosis, unspecified type (Primary) -Lab work from 04/24/2023 shows slight improvement of white blood cell count now at 12.9 (14.6) with ANC elevated at 9.4 (11.4). -Bone marrow biopsy from 01/27/2023 showed no significant CD3 4 positive blastic population identified.  T cells with nonspecific changes.  No monoclonal B-cell population identified.  Normocellular bone marrow for age with trilineage hematopoiesis.  There are mild dyspoietic changes involving the granular lytic cell lines with no increase in blastic cells.  The findings are not considered specific or diagnostic of a myeloid neoplasm and may be secondary in nature to infection, immune mediated process or medication.   -We discussed results in detail.   -Previous CRP and sed rate were elevated likely indicating inflammation. -Continue to favor elevated white count secondary to inflammatory state.  2. Elevated platelet count -Labs from 04/24/2023 show a platelet count of 436 (430).  3. Iron  deficiency anemia, unspecified iron  deficiency anemia type -Labs from 04/24/2023 show hemoglobin of 14.3, ferritin 216 with iron  saturation 12%.  TIBC is normal at 256.  -She has received intermittent IV Venofer  last given on 02/20/2023, 03/06/2023 and 03/13/2023. -In the setting of CKD, iron  saturations typically should be around 30%. -If patient is agreeable would recommend additional IV iron . -IDA likely secondary to previous bariatric surgery. -Vitamin B 12 level continues to be elevated (1115) but has significantly improved over the course of the past few months.   4. Chronic pain syndrome -Continue follow-up with [facility].  -Continue narcotics as prescribed. -Continue taking stool softener or laxative.  5.  Left hip pain -Status post gluteal medius repair with bursitis on 05/28/2021. -Per op  review 2 anchors were placed along with an implant. -She has concerns given poor toleration of medications that she may be rejecting the foreign body that was placed in her hip. -Reports worsening pain over the past 6 months or so. -Recommend follow-up with Dr. Genelle.   Follow Up Instructions: -Recommend 3-4 additional doses of IV Venofer . -We discussed MRI of left hip if insurance will approve.  If not, left hip x-rays followed by MRI.  He may need referral back to Ortho. -Return to clinic in approximately 3 months for follow-up with labs a few days before.    I discussed the assessment and treatment plan with the patient. The patient was provided an opportunity to ask questions and all were answered. The patient agreed with the plan and demonstrated an understanding of the instructions.   The patient was advised to call back or seek an in-person evaluation if the symptoms worsen or if the condition fails to improve as anticipated.  I provided 30 minutes of non-face-to-face time during this encounter.   Delon FORBES Hope, NP

## 2023-05-01 ENCOUNTER — Inpatient Hospital Stay: Payer: PPO | Attending: Physician Assistant | Admitting: Oncology

## 2023-05-01 DIAGNOSIS — Z9884 Bariatric surgery status: Secondary | ICD-10-CM | POA: Insufficient documentation

## 2023-05-01 DIAGNOSIS — R509 Fever, unspecified: Secondary | ICD-10-CM | POA: Insufficient documentation

## 2023-05-01 DIAGNOSIS — K5909 Other constipation: Secondary | ICD-10-CM | POA: Insufficient documentation

## 2023-05-01 DIAGNOSIS — F411 Generalized anxiety disorder: Secondary | ICD-10-CM | POA: Insufficient documentation

## 2023-05-01 DIAGNOSIS — R102 Pelvic and perineal pain: Secondary | ICD-10-CM | POA: Insufficient documentation

## 2023-05-01 DIAGNOSIS — D509 Iron deficiency anemia, unspecified: Secondary | ICD-10-CM

## 2023-05-01 DIAGNOSIS — R5383 Other fatigue: Secondary | ICD-10-CM | POA: Insufficient documentation

## 2023-05-01 DIAGNOSIS — R42 Dizziness and giddiness: Secondary | ICD-10-CM | POA: Insufficient documentation

## 2023-05-01 DIAGNOSIS — D72829 Elevated white blood cell count, unspecified: Secondary | ICD-10-CM

## 2023-05-01 DIAGNOSIS — R110 Nausea: Secondary | ICD-10-CM | POA: Insufficient documentation

## 2023-05-01 DIAGNOSIS — D508 Other iron deficiency anemias: Secondary | ICD-10-CM | POA: Insufficient documentation

## 2023-05-01 DIAGNOSIS — M25552 Pain in left hip: Secondary | ICD-10-CM | POA: Diagnosis not present

## 2023-05-01 DIAGNOSIS — R197 Diarrhea, unspecified: Secondary | ICD-10-CM | POA: Insufficient documentation

## 2023-05-01 DIAGNOSIS — M47816 Spondylosis without myelopathy or radiculopathy, lumbar region: Secondary | ICD-10-CM | POA: Insufficient documentation

## 2023-05-01 DIAGNOSIS — R519 Headache, unspecified: Secondary | ICD-10-CM | POA: Insufficient documentation

## 2023-05-01 DIAGNOSIS — G894 Chronic pain syndrome: Secondary | ICD-10-CM | POA: Diagnosis not present

## 2023-05-01 DIAGNOSIS — F32A Depression, unspecified: Secondary | ICD-10-CM | POA: Insufficient documentation

## 2023-05-01 DIAGNOSIS — M791 Myalgia, unspecified site: Secondary | ICD-10-CM | POA: Insufficient documentation

## 2023-05-01 DIAGNOSIS — M549 Dorsalgia, unspecified: Secondary | ICD-10-CM | POA: Insufficient documentation

## 2023-05-01 DIAGNOSIS — R7989 Other specified abnormal findings of blood chemistry: Secondary | ICD-10-CM | POA: Diagnosis not present

## 2023-05-01 DIAGNOSIS — M2550 Pain in unspecified joint: Secondary | ICD-10-CM | POA: Insufficient documentation

## 2023-05-01 DIAGNOSIS — R630 Anorexia: Secondary | ICD-10-CM | POA: Insufficient documentation

## 2023-05-01 DIAGNOSIS — Z79899 Other long term (current) drug therapy: Secondary | ICD-10-CM | POA: Insufficient documentation

## 2023-05-01 DIAGNOSIS — E282 Polycystic ovarian syndrome: Secondary | ICD-10-CM | POA: Insufficient documentation

## 2023-05-01 DIAGNOSIS — M545 Low back pain, unspecified: Secondary | ICD-10-CM | POA: Insufficient documentation

## 2023-05-01 DIAGNOSIS — G629 Polyneuropathy, unspecified: Secondary | ICD-10-CM | POA: Insufficient documentation

## 2023-05-01 DIAGNOSIS — G479 Sleep disorder, unspecified: Secondary | ICD-10-CM | POA: Insufficient documentation

## 2023-05-01 DIAGNOSIS — R0602 Shortness of breath: Secondary | ICD-10-CM | POA: Insufficient documentation

## 2023-05-05 ENCOUNTER — Ambulatory Visit: Payer: PPO | Admitting: Family Medicine

## 2023-05-08 ENCOUNTER — Inpatient Hospital Stay: Payer: PPO

## 2023-05-08 VITALS — BP 119/64 | HR 80 | Temp 97.0°F | Resp 18

## 2023-05-08 DIAGNOSIS — R630 Anorexia: Secondary | ICD-10-CM | POA: Diagnosis not present

## 2023-05-08 DIAGNOSIS — G894 Chronic pain syndrome: Secondary | ICD-10-CM | POA: Diagnosis not present

## 2023-05-08 DIAGNOSIS — R197 Diarrhea, unspecified: Secondary | ICD-10-CM | POA: Diagnosis not present

## 2023-05-08 DIAGNOSIS — M545 Low back pain, unspecified: Secondary | ICD-10-CM | POA: Diagnosis not present

## 2023-05-08 DIAGNOSIS — K5909 Other constipation: Secondary | ICD-10-CM | POA: Diagnosis not present

## 2023-05-08 DIAGNOSIS — R519 Headache, unspecified: Secondary | ICD-10-CM | POA: Diagnosis not present

## 2023-05-08 DIAGNOSIS — E282 Polycystic ovarian syndrome: Secondary | ICD-10-CM | POA: Diagnosis not present

## 2023-05-08 DIAGNOSIS — D508 Other iron deficiency anemias: Secondary | ICD-10-CM | POA: Diagnosis not present

## 2023-05-08 DIAGNOSIS — Z9884 Bariatric surgery status: Secondary | ICD-10-CM | POA: Diagnosis not present

## 2023-05-08 DIAGNOSIS — R42 Dizziness and giddiness: Secondary | ICD-10-CM | POA: Diagnosis not present

## 2023-05-08 DIAGNOSIS — R5383 Other fatigue: Secondary | ICD-10-CM | POA: Diagnosis not present

## 2023-05-08 DIAGNOSIS — G479 Sleep disorder, unspecified: Secondary | ICD-10-CM | POA: Diagnosis not present

## 2023-05-08 DIAGNOSIS — M549 Dorsalgia, unspecified: Secondary | ICD-10-CM | POA: Diagnosis not present

## 2023-05-08 DIAGNOSIS — M2550 Pain in unspecified joint: Secondary | ICD-10-CM | POA: Diagnosis not present

## 2023-05-08 DIAGNOSIS — R110 Nausea: Secondary | ICD-10-CM | POA: Diagnosis not present

## 2023-05-08 DIAGNOSIS — M47816 Spondylosis without myelopathy or radiculopathy, lumbar region: Secondary | ICD-10-CM | POA: Diagnosis not present

## 2023-05-08 DIAGNOSIS — G629 Polyneuropathy, unspecified: Secondary | ICD-10-CM | POA: Diagnosis not present

## 2023-05-08 DIAGNOSIS — R0602 Shortness of breath: Secondary | ICD-10-CM | POA: Diagnosis not present

## 2023-05-08 DIAGNOSIS — R509 Fever, unspecified: Secondary | ICD-10-CM | POA: Diagnosis not present

## 2023-05-08 DIAGNOSIS — F32A Depression, unspecified: Secondary | ICD-10-CM | POA: Diagnosis not present

## 2023-05-08 DIAGNOSIS — M25552 Pain in left hip: Secondary | ICD-10-CM | POA: Diagnosis not present

## 2023-05-08 DIAGNOSIS — D72829 Elevated white blood cell count, unspecified: Secondary | ICD-10-CM | POA: Diagnosis not present

## 2023-05-08 DIAGNOSIS — F411 Generalized anxiety disorder: Secondary | ICD-10-CM | POA: Diagnosis not present

## 2023-05-08 DIAGNOSIS — R102 Pelvic and perineal pain: Secondary | ICD-10-CM | POA: Diagnosis not present

## 2023-05-08 MED ORDER — SODIUM CHLORIDE 0.9 % IV SOLN
300.0000 mg | Freq: Once | INTRAVENOUS | Status: AC
  Administered 2023-05-08: 300.0065 mg via INTRAVENOUS
  Filled 2023-05-08: qty 300

## 2023-05-08 MED ORDER — SODIUM CHLORIDE 0.9 % IV SOLN: INTRAVENOUS | Status: DC

## 2023-05-08 NOTE — Progress Notes (Signed)
 Patient presents today for venofer 300 mg iron infusion. Heart rate on arrival elevated. Heart rate after rest 103. Patient denies any chest pain, shortness of breath. Patient took blood pressure medication this am. Patient took pre-medications at 08:30 am

## 2023-05-08 NOTE — Progress Notes (Signed)
 Patient tolerated iron infusion with no complaints voiced.  Peripheral IV site clean and dry with good blood return noted before and after infusion.  Band aid applied.  VSS with discharge and left in satisfactory condition with no s/s of distress noted.

## 2023-05-08 NOTE — Patient Instructions (Signed)
 [facility] - A DEPT OF [facility]  Discharge Instructions: Thank you for choosing [facility] to provide your oncology and hematology care.  If you have a lab appointment with the Cancer Center - please note that after April 8th, 2024, all labs will be drawn in the cancer center.  You do not have to check in or register with the main entrance as you have in the past but will complete your check-in in the cancer center.  Wear comfortable clothing and clothing appropriate for easy access to any Portacath or PICC line.   We strive to give you quality time with your provider. You may need to reschedule your appointment if you arrive late (15 or more minutes).  Arriving late affects you and other patients whose appointments are after yours.  Also, if you miss three or more appointments without notifying the office, you may be dismissed from the clinic at the provider's discretion.      For prescription refill requests, have your pharmacy contact our office and allow 72 hours for refills to be completed.    Today you received the following Venofer , return as scheduled.   To help prevent nausea and vomiting after your treatment, we encourage you to take your nausea medication as directed.  BELOW ARE SYMPTOMS THAT SHOULD BE REPORTED IMMEDIATELY: *FEVER GREATER THAN 100.4 F (38 C) OR HIGHER *CHILLS OR SWEATING *NAUSEA AND VOMITING THAT IS NOT CONTROLLED WITH YOUR NAUSEA MEDICATION *UNUSUAL SHORTNESS OF BREATH *UNUSUAL BRUISING OR BLEEDING *URINARY PROBLEMS (pain or burning when urinating, or frequent urination) *BOWEL PROBLEMS (unusual diarrhea, constipation, pain near the anus) TENDERNESS IN MOUTH AND THROAT WITH OR WITHOUT PRESENCE OF ULCERS (sore throat, sores in mouth, or a toothache) UNUSUAL RASH, SWELLING OR PAIN  UNUSUAL VAGINAL DISCHARGE OR ITCHING   Items with * indicate a potential emergency and should be followed up as soon as possible or  go to the Emergency Department if any problems should occur.  Please show the CHEMOTHERAPY ALERT CARD or IMMUNOTHERAPY ALERT CARD at check-in to the Emergency Department and triage nurse.  Should you have questions after your visit or need to cancel or reschedule your appointment, please contact [facility] [place] - A DEPT OF [facility] [phone]  and follow the prompts.  Office hours are 8:00 a.m. to 4:30 p.m. Monday - Friday. Please note that voicemails left after 4:00 p.m. may not be returned until the following business day.  We are closed weekends and major holidays. You have access to a nurse at all times for urgent questions. Please call the main number to the clinic [phone] and follow the prompts.  For any non-urgent questions, you may also contact your provider using MyChart. We now offer e-Visits for anyone 59 and older to request care online for non-urgent symptoms. For details visit [email or web address].   Also download the MyChart app! Go to the app store, search MyChart, open the app, select [facility], and log in with your MyChart username and password.

## 2023-05-09 ENCOUNTER — Ambulatory Visit (HOSPITAL_COMMUNITY): Payer: PPO

## 2023-05-11 ENCOUNTER — Ambulatory Visit: Admission: RE | Admit: 2023-05-11 | Payer: PPO | Source: Ambulatory Visit

## 2023-05-11 DIAGNOSIS — F411 Generalized anxiety disorder: Secondary | ICD-10-CM | POA: Diagnosis not present

## 2023-05-11 DIAGNOSIS — F331 Major depressive disorder, recurrent, moderate: Secondary | ICD-10-CM | POA: Diagnosis not present

## 2023-05-12 ENCOUNTER — Telehealth: Payer: Self-pay | Admitting: *Deleted

## 2023-05-12 DIAGNOSIS — F331 Major depressive disorder, recurrent, moderate: Secondary | ICD-10-CM | POA: Diagnosis not present

## 2023-05-12 DIAGNOSIS — F410 Panic disorder [episodic paroxysmal anxiety] without agoraphobia: Secondary | ICD-10-CM | POA: Diagnosis not present

## 2023-05-12 DIAGNOSIS — F411 Generalized anxiety disorder: Secondary | ICD-10-CM | POA: Diagnosis not present

## 2023-05-12 NOTE — Telephone Encounter (Signed)
 Patient called to advise that she had severe vomiting on Friday, Saturday and Sunday, associated with sore throat.  Denies fever.  States that she has gained 6 lb since Friday.  Consulted with [facility], PAC who does not feel this is related to iron  infusion received Friday and has recommended that she reach out to PCP.  We will push iron  infusion out an additional week until she is feeling better.  Patient made aware and will reach out to Dr. Alphonsa for further recommendations.

## 2023-05-13 ENCOUNTER — Ambulatory Visit: Payer: PPO | Admitting: Family Medicine

## 2023-05-13 VITALS — BP 123/85 | HR 124 | Temp 97.4°F | Ht 65.0 in

## 2023-05-13 DIAGNOSIS — I495 Sick sinus syndrome: Secondary | ICD-10-CM

## 2023-05-13 DIAGNOSIS — D508 Other iron deficiency anemias: Secondary | ICD-10-CM | POA: Diagnosis not present

## 2023-05-13 DIAGNOSIS — F333 Major depressive disorder, recurrent, severe with psychotic symptoms: Secondary | ICD-10-CM | POA: Diagnosis not present

## 2023-05-13 DIAGNOSIS — R110 Nausea: Secondary | ICD-10-CM

## 2023-05-13 MED ORDER — METOPROLOL TARTRATE 25 MG PO TABS: 12.5000 mg | ORAL_TABLET | Freq: Two times a day (BID) | ORAL | 4 refills | Status: DC | PRN

## 2023-05-13 MED ORDER — PROMETHAZINE HCL 25 MG PO TABS: 25.0000 mg | ORAL_TABLET | Freq: Three times a day (TID) | ORAL | 1 refills | Status: DC | PRN

## 2023-05-13 NOTE — Progress Notes (Signed)
   Subjective:    Patient ID: Katelyn [name], female    DOB: Feb 13, 1976, 48 y.o.   MRN: [id number]  HPI Very nice patient Was doing well until she had iron  infusion since then has had some nausea dizziness not feeling good some intermittent vomiting as well no diarrhea.  No fever chills or sweats. She also suffers with reflex tachycardia that gets up into the 120s to 130s when she gets up and moves around which makes her feel fatigued and tired Patient states previously when she gets iron  infusions she gets similar symptoms but recently had a larger dose of iron  and states that she had more symptoms than usual She is requesting Phenergan  for nausea when trying these iron  infusions   Review of Systems     Objective:   Physical Exam  General-in no acute distress Eyes-no discharge Lungs-respiratory rate normal, CTA CV-no murmurs Blood pressure approximately 110/72 laying with a heart rate of the 100 when she sits it drops to 100/68 with a heart rate of approximately 110-120 when she stands blood pressure drops to 90/60 with a heart rate of 1 20-1 30 Extremities skin warm dry no edema Neuro grossly normal Behavior normal, alert       Assessment & Plan:  1. Tachy-brady syndrome (HCC) We did discuss low-dose metoprolol  25 mg 1/2 tablet twice daily she is to give us  feedback on how this is doing send us  a message if it is causing problems stop medicine if it is causing problems ideally buffered the heart rate to be lower but not down into the 70s or 60s  2. Obesity, morbid (HCC) Healthy diet regular activity  3. Depression, major, recurrent, severe with psychosis (HCC) Continue antidepressants and following with her specialist  4. Nausea (Primary) Recommend Phenergan  every 4 hours as needed nausea  5. Other iron  deficiency anemia Follow through with iron  infusions but discussed with hematology the possibility going back to 200 mg because she seemed to tolerate that better-we  will relay this to hematology but ultimately treatment choices for the back aspect will be left to a collaborative effort between the specialist and the patient  I do not find any evidence of a GI issue currently or evidence of gastroenteritis currently

## 2023-05-14 ENCOUNTER — Ambulatory Visit (HOSPITAL_COMMUNITY)
Admission: RE | Admit: 2023-05-14 | Discharge: 2023-05-14 | Disposition: A | Discharge status: Home / Self Care | Payer: PPO | Source: Ambulatory Visit | Attending: Oncology | Admitting: Oncology

## 2023-05-14 DIAGNOSIS — M1612 Unilateral primary osteoarthritis, left hip: Secondary | ICD-10-CM | POA: Diagnosis not present

## 2023-05-14 DIAGNOSIS — G8929 Other chronic pain: Secondary | ICD-10-CM | POA: Diagnosis not present

## 2023-05-14 DIAGNOSIS — D259 Leiomyoma of uterus, unspecified: Secondary | ICD-10-CM | POA: Diagnosis not present

## 2023-05-14 DIAGNOSIS — M25552 Pain in left hip: Secondary | ICD-10-CM | POA: Diagnosis not present

## 2023-05-15 ENCOUNTER — Inpatient Hospital Stay: Payer: PPO

## 2023-05-20 ENCOUNTER — Encounter: Payer: Self-pay | Admitting: Oncology

## 2023-05-22 ENCOUNTER — Encounter: Payer: Self-pay | Admitting: Family Medicine

## 2023-05-22 ENCOUNTER — Ambulatory Visit: Payer: PPO | Admitting: Family Medicine

## 2023-05-22 ENCOUNTER — Inpatient Hospital Stay: Payer: PPO

## 2023-05-22 VITALS — BP 118/74 | HR 119 | Temp 97.2°F | Ht 65.0 in

## 2023-05-22 DIAGNOSIS — E785 Hyperlipidemia, unspecified: Secondary | ICD-10-CM

## 2023-05-22 DIAGNOSIS — D259 Leiomyoma of uterus, unspecified: Secondary | ICD-10-CM

## 2023-05-22 DIAGNOSIS — I495 Sick sinus syndrome: Secondary | ICD-10-CM

## 2023-05-22 DIAGNOSIS — D508 Other iron deficiency anemias: Secondary | ICD-10-CM | POA: Diagnosis not present

## 2023-05-22 MED ORDER — OMEPRAZOLE 20 MG PO CPDR: 20.0000 mg | DELAYED_RELEASE_CAPSULE | Freq: Every day | ORAL | 6 refills | Status: AC | PRN

## 2023-05-23 NOTE — Progress Notes (Signed)
Subjective:    Patient ID: Katelyn [name], female    DOB: April 01, 1976, 48 y.o.   MRN: [id number]  Discussed the use of AI scribe software for clinical note transcription with the patient, who gave verbal consent to proceed.  History of Present Illness   The patient, with a history of small fiber neuropathy, has been experiencing a progressive decline in health over the past year. Despite multiple consultations and tests, the cause of the patient's deteriorating condition remains undetermined. The patient reports a myriad of symptoms, including dysautonomia, blood pressure fluctuations, elevated heart rate, temperature dysregulation, and difficulty swallowing, which occasionally leads to choking episodes. The patient also experiences instability when turning quickly.  The patient has been referred to a small fiber neuropathy specialist at [name] and is awaiting an appointment in April. The patient also reports persistent pain in the left hip, which was recently evaluated with an MRI. The results suggested osteoarthritis but did not indicate a source of infection that could explain the patient's elevated white blood cell count.  The patient also reports changes in menstrual cycles, which have become more unpredictable over the past year. A recent MRI revealed a growing fibroid, which has increased by one centimeter since April. The patient denies any vaginal bleeding issues outside of regular menstrual cycles.  The patient also reports generalized weakness and pain in both hips, which has progressively worsened. The pain initially started in the left hip and has now spread to the right. Despite engaging in physical therapy and stretching exercises, the patient has not seen significant improvement.  The patient also reports sensitivity to medications and physical contact, including clothing pressure. This sensitivity extends to the patient's hand, where even holding hands with her spouse can cause  discomfort. The patient also reports difficulty tolerating heat and often experiences low-grade fevers during the summer months.  The patient's condition has significantly impacted her daily life, leading to concerns about her long-term prognosis and the potential need for her spouse to take intermittent leave from work to assist with medical appointments and care.         Review of Systems     Objective:    Physical Exam   VITALS: P- 196 SKIN: Cold with sweating.     General-in no acute distress Eyes-no discharge Lungs-respiratory rate normal, CTA CV-no murmurs,RRR Extremities skin warm dry no edema Neuro grossly normal Behavior normal, alert       Assessment & Plan:  Assessment and Plan    Autonomic Dysfunction Symptoms of blood pressure fluctuations, heart rate elevations, temperature dysregulation, and difficulty swallowing. -Referral to [facility] for further evaluation in April 2025.  Osteoarthritis of the Left Hip MRI results indicate osteoarthritis, no signs of infection. -Continue current management and consider follow-up with orthopedist for further evaluation.  Uterine Fibroid Growth noted since April, causing changes in menstrual cycle but no excessive bleeding. -Communicate with OB/GYN regarding the fibroid and its potential contribution to iron deficiency.  Tachycardia Reports of heart rate reaching 196 bpm. -Coordinate with cardiology for telemetry monitoring to assess rhythm abnormalities.  Chronic Pain Reports of worsening pain in hips and back, with physical therapy providing some relief. -Consider re-initiation of physical therapy, with potential for aquatic therapy.  Elevated White Blood Count and Platelets Persistent elevation, possibly indicative of inflammation. -Reach out to rheumatology for potential re-evaluation of autoimmune status.  Follow-up appointment scheduled for May 2025.     1. Tachy-brady syndrome (HCC) (Primary) It is concerning  that her heart rate  goes for so far out.  We will look into to see if we can get telemetry monitor for her.  If we cannot do this we will consult her through cardiology  2. Obesity, morbid (HCC) Portion control regular physical activity as tolerated  Major depression Patient under the care of a specialist for this  4. Other iron deficiency anemia Under the care of hematology  5. Hyperlipidemia, unspecified hyperlipidemia type Follow periodically healthy diet  6. Uterine leiomyoma, unspecified location Contact OB/GYN to see if they feel that this needs further intervention or just watching

## 2023-05-29 ENCOUNTER — Inpatient Hospital Stay: Payer: PPO

## 2023-05-29 VITALS — BP 112/61 | HR 82 | Temp 98.4°F | Resp 19

## 2023-05-29 DIAGNOSIS — D508 Other iron deficiency anemias: Secondary | ICD-10-CM | POA: Diagnosis not present

## 2023-05-29 MED ORDER — SODIUM CHLORIDE 0.9 % IV SOLN
200.0000 mg | Freq: Once | INTRAVENOUS | Status: AC
  Administered 2023-05-29: 200 mg via INTRAVENOUS
  Filled 2023-05-29: qty 10

## 2023-05-29 MED ORDER — SODIUM CHLORIDE 0.9 % IV SOLN: INTRAVENOUS | Status: DC

## 2023-05-29 NOTE — Progress Notes (Signed)
Venofer 200 mg iron infusion given per orders. Patient tolerated it well without problems. Vitals stable and discharged home from clinic ambulatory. Follow up as scheduled.

## 2023-05-29 NOTE — Patient Instructions (Signed)
[facility] [place] - A DEPT OF [facility]  Discharge Instructions: Thank you for choosing [facility] to provide your oncology and hematology care.  If you have a lab appointment with the Cancer Center - please note that after April 8th, 2024, all labs will be drawn in the cancer center.  You do not have to check in or register with the main entrance as you have in the past but will complete your check-in in the cancer center.  Wear comfortable clothing and clothing appropriate for easy access to any Portacath or PICC line.   We strive to give you quality time with your provider. You may need to reschedule your appointment if you arrive late (15 or more minutes).  Arriving late affects you and other patients whose appointments are after yours.  Also, if you miss three or more appointments without notifying the office, you may be dismissed from the clinic at the provider's discretion.      For prescription refill requests, have your pharmacy contact our office and allow 72 hours for refills to be completed.    Today you received the following iron infusion: venofer 200mg    To help prevent nausea and vomiting after your treatment, we encourage you to take your nausea medication as directed.  BELOW ARE SYMPTOMS THAT SHOULD BE REPORTED IMMEDIATELY: *FEVER GREATER THAN 100.4 F (38 C) OR HIGHER *CHILLS OR SWEATING *NAUSEA AND VOMITING THAT IS NOT CONTROLLED WITH YOUR NAUSEA MEDICATION *UNUSUAL SHORTNESS OF BREATH *UNUSUAL BRUISING OR BLEEDING *URINARY PROBLEMS (pain or burning when urinating, or frequent urination) *BOWEL PROBLEMS (unusual diarrhea, constipation, pain near the anus) TENDERNESS IN MOUTH AND THROAT WITH OR WITHOUT PRESENCE OF ULCERS (sore throat, sores in mouth, or a toothache) UNUSUAL RASH, SWELLING OR PAIN  UNUSUAL VAGINAL DISCHARGE OR ITCHING   Items with * indicate a potential emergency and should be followed up as soon as possible or  go to the Emergency Department if any problems should occur.  Please show the CHEMOTHERAPY ALERT CARD or IMMUNOTHERAPY ALERT CARD at check-in to the Emergency Department and triage nurse.  Should you have questions after your visit or need to cancel or reschedule your appointment, please contact [facility] [place] - A DEPT OF [facility] [phone]  and follow the prompts.  Office hours are 8:00 a.m. to 4:30 p.m. Monday - Friday. Please note that voicemails left after 4:00 p.m. may not be returned until the following business day.  We are closed weekends and major holidays. You have access to a nurse at all times for urgent questions. Please call the main number to the clinic [phone] and follow the prompts.  For any non-urgent questions, you may also contact your provider using MyChart. We now offer e-Visits for anyone 110 and older to request care online for non-urgent symptoms. For details visit [email or web address].   Also download the MyChart app! Go to the app store, search "MyChart", open the app, select [place], and log in with your MyChart username and password.

## 2023-05-31 ENCOUNTER — Telehealth: Payer: Self-pay | Admitting: Family Medicine

## 2023-05-31 DIAGNOSIS — I495 Sick sinus syndrome: Secondary | ICD-10-CM

## 2023-05-31 NOTE — Telephone Encounter (Signed)
Nurses I did talk with Autumn regarding trying to get telemetry.  I was informed that this needs to be through cardiology.  Please connect with patient let her know that we would have to get telemetry through cardiology.  We would recommend referral to cardiology Reason for referral tachycardia autonomic dysfunction If she is on board go ahead with the referral thank you

## 2023-06-01 NOTE — Telephone Encounter (Signed)
 Left message to return call

## 2023-06-03 ENCOUNTER — Other Ambulatory Visit: Payer: Self-pay | Admitting: Family Medicine

## 2023-06-05 ENCOUNTER — Inpatient Hospital Stay: Payer: PPO | Attending: Physician Assistant

## 2023-06-05 VITALS — BP 137/83 | HR 86 | Temp 97.5°F | Resp 18

## 2023-06-05 DIAGNOSIS — D508 Other iron deficiency anemias: Secondary | ICD-10-CM | POA: Diagnosis not present

## 2023-06-05 MED ORDER — SODIUM CHLORIDE 0.9 % IV SOLN: INTRAVENOUS | Status: DC

## 2023-06-05 MED ORDER — SODIUM CHLORIDE 0.9 % IV SOLN
200.0000 mg | Freq: Once | INTRAVENOUS | Status: AC
  Administered 2023-06-05: 200 mg via INTRAVENOUS
  Filled 2023-06-05: qty 10

## 2023-06-05 NOTE — Patient Instructions (Signed)
 [facility] [place] - [facility]  Discharge Instructions: Thank you for choosing [facility] to provide your oncology and hematology care.  If you have a lab appointment with the Cancer Center - please note that after April 8th, 2024, all labs will be drawn in the cancer center.  You do not have to check in or register with the main entrance as you have in the past but will complete your check-in in the cancer center.  Wear comfortable clothing and clothing appropriate for easy access to any Portacath or PICC line.   We strive to give you quality time with your provider. You may need to reschedule your appointment if you arrive late (15 or more minutes).  Arriving late affects you and other patients whose appointments are after yours.  Also, if you miss three or more appointments without notifying the office, you may be dismissed from the clinic at the provider's discretion.      For prescription refill requests, have your pharmacy contact our office and allow 72 hours for refills to be completed.    Today you received the following chemotherapy and/or immunotherapy agents Venofer. Iron Sucrose Injection What is this medication? IRON SUCROSE (EYE ern SOO krose) treats low levels of iron (iron deficiency anemia) in people with kidney disease. Iron is a mineral that plays an important role in making red blood cells, which carry oxygen from your lungs to the rest of your body. This medicine may be used for other purposes; ask your health care provider or pharmacist if you have questions. COMMON BRAND NAME(S): Venofer What should I tell my care team before I take this medication? They need to know if you have any of these conditions: Anemia not caused by low iron levels Heart disease High levels of iron in the blood Kidney disease Liver disease An unusual or allergic reaction to iron, other medications, foods, dyes, or preservatives Pregnant or  trying to get pregnant Breastfeeding How should I use this medication? This medication is infused into a vein. It is given by your care team in a hospital or clinic setting. Talk to your care team about the use of this medication in children. While it may be prescribed for children as young as 2 years for selected conditions, precautions do apply. Overdosage: If you think you have taken too much of this medicine contact a poison control center or emergency room at once. NOTE: This medicine is only for you. Do not share this medicine with others. What if I miss a dose? Keep appointments for follow-up doses. It is important not to miss your dose. Call your care team if you are unable to keep an appointment. What may interact with this medication? Do not take this medication with any of the following: Deferoxamine Dimercaprol Other iron products This medication may also interact with the following: Chloramphenicol Deferasirox This list may not describe all possible interactions. Give your health care provider a list of all the medicines, herbs, non-prescription drugs, or dietary supplements you use. Also tell them if you smoke, drink alcohol, or use illegal drugs. Some items may interact with your medicine. What should I watch for while using this medication? Visit your care team for regular checks on your progress. Tell your care team if your symptoms do not start to get better or if they get worse. You may need blood work done while you are taking this medication. You may need to eat  more foods that contain iron. Talk to your care team. Foods that contain iron include whole grains or cereals, dried fruits, beans, peas, leafy green vegetables, and organ meats (liver, kidney). What side effects may I notice from receiving this medication? Side effects that you should report to your care team as soon as possible: Allergic reactions--skin rash, itching, hives, swelling of the face, lips, tongue,  or throat Low blood pressure--dizziness, feeling faint or lightheaded, blurry vision Shortness of breath Side effects that usually do not require medical attention (report to your care team if they continue or are bothersome): Flushing Headache Joint pain Muscle pain Nausea Pain, redness, or irritation at injection site This list may not describe all possible side effects. Call your doctor for medical advice about side effects. You may report side effects to FDA at 1-800-FDA-1088. Where should I keep my medication? This medication is given in a hospital or clinic. It will not be stored at home. NOTE: This sheet is a summary. It may not cover all possible information. If you have questions about this medicine, talk to your doctor, pharmacist, or health care provider.  2024 Elsevier/Gold Standard (2022-12-03 00:00:00)      To help prevent nausea and vomiting after your treatment, we encourage you to take your nausea medication as directed.  BELOW ARE SYMPTOMS THAT SHOULD BE REPORTED IMMEDIATELY: *FEVER GREATER THAN 100.4 F (38 C) OR HIGHER *CHILLS OR SWEATING *NAUSEA AND VOMITING THAT IS NOT CONTROLLED WITH YOUR NAUSEA MEDICATION *UNUSUAL SHORTNESS OF BREATH *UNUSUAL BRUISING OR BLEEDING *URINARY PROBLEMS (pain or burning when urinating, or frequent urination) *BOWEL PROBLEMS (unusual diarrhea, constipation, pain near the anus) TENDERNESS IN MOUTH AND THROAT WITH OR WITHOUT PRESENCE OF ULCERS (sore throat, sores in mouth, or a toothache) UNUSUAL RASH, SWELLING OR PAIN  UNUSUAL VAGINAL DISCHARGE OR ITCHING   Items with * indicate a potential emergency and should be followed up as soon as possible or go to the Emergency Department if any problems should occur.  Please show the CHEMOTHERAPY ALERT CARD or IMMUNOTHERAPY ALERT CARD at check-in to the Emergency Department and triage nurse.  Should you have questions after your visit or need to cancel or reschedule your appointment, please  contact [facility] [place] - A DEPT OF [facility] [phone]  and follow the prompts.  Office hours are 8:00 a.m. to 4:30 p.m. Monday - Friday. Please note that voicemails left after 4:00 p.m. may not be returned until the following business day.  We are closed weekends and major holidays. You have access to a nurse at all times for urgent questions. Please call the main number to the clinic [phone] and follow the prompts.  For any non-urgent questions, you may also contact your provider using MyChart. We now offer e-Visits for anyone 70 and older to request care online for non-urgent symptoms. For details visit [email or web address].   Also download the MyChart app! Go to the app store, search "MyChart", open the app, select [place], and log in with your MyChart username and password.

## 2023-06-05 NOTE — Telephone Encounter (Signed)
 Explained and discussed Dr Fairy Homer comments with pt, pt verbalized understanding and is okay with referral to cardio.

## 2023-06-05 NOTE — Telephone Encounter (Signed)
 Left a message to follow up on information regarding referral to cardiology.

## 2023-06-05 NOTE — Progress Notes (Signed)
 Patient presents today for venofer  iron  infusion. Heart rate elevated on arrival and diastolic blood pressure elevated on arrival. Patient denies any chest pain, shortness of breath or dizziness.   Heart rate recheck 95 and blood pressure 139/78.   Patient took Pepcid  20 mg , Tylenol  650 mg and Benadryl  25 mg prior to arrival.   Venofer  given today per MD orders. Tolerated infusion without adverse affects. Vital signs stable. No complaints at this time. Discharged from clinic ambulatory in stable condition. Alert and oriented x 3. F/U with [facility] as scheduled.

## 2023-06-09 DIAGNOSIS — Z79891 Long term (current) use of opiate analgesic: Secondary | ICD-10-CM | POA: Diagnosis not present

## 2023-06-09 DIAGNOSIS — M47816 Spondylosis without myelopathy or radiculopathy, lumbar region: Secondary | ICD-10-CM | POA: Diagnosis not present

## 2023-06-09 DIAGNOSIS — M7918 Myalgia, other site: Secondary | ICD-10-CM | POA: Diagnosis not present

## 2023-06-09 DIAGNOSIS — G8929 Other chronic pain: Secondary | ICD-10-CM | POA: Diagnosis not present

## 2023-06-09 DIAGNOSIS — G894 Chronic pain syndrome: Secondary | ICD-10-CM | POA: Diagnosis not present

## 2023-06-09 DIAGNOSIS — Z5181 Encounter for therapeutic drug level monitoring: Secondary | ICD-10-CM | POA: Diagnosis not present

## 2023-06-09 DIAGNOSIS — M533 Sacrococcygeal disorders, not elsewhere classified: Secondary | ICD-10-CM | POA: Diagnosis not present

## 2023-06-09 DIAGNOSIS — R102 Pelvic and perineal pain: Secondary | ICD-10-CM | POA: Diagnosis not present

## 2023-06-10 ENCOUNTER — Encounter: Payer: Self-pay | Admitting: Obstetrics and Gynecology

## 2023-06-15 DIAGNOSIS — F411 Generalized anxiety disorder: Secondary | ICD-10-CM | POA: Diagnosis not present

## 2023-06-15 DIAGNOSIS — F331 Major depressive disorder, recurrent, moderate: Secondary | ICD-10-CM | POA: Diagnosis not present
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
## 2023-06-25 ENCOUNTER — Encounter: Payer: Self-pay | Admitting: Cardiovascular Disease

## 2023-06-25 ENCOUNTER — Other Ambulatory Visit: Payer: Self-pay

## 2023-06-25 ENCOUNTER — Ambulatory Visit: Payer: PPO | Attending: Cardiovascular Disease | Admitting: Cardiovascular Disease

## 2023-06-25 VITALS — BP 134/88 | HR 120 | Ht 65.0 in

## 2023-06-25 DIAGNOSIS — G901 Familial dysautonomia [Riley-Day]: Secondary | ICD-10-CM

## 2023-06-25 DIAGNOSIS — I10 Essential (primary) hypertension: Secondary | ICD-10-CM | POA: Diagnosis not present

## 2023-06-25 DIAGNOSIS — I495 Sick sinus syndrome: Secondary | ICD-10-CM

## 2023-06-25 DIAGNOSIS — G629 Polyneuropathy, unspecified: Secondary | ICD-10-CM

## 2023-06-25 NOTE — Patient Instructions (Signed)
 Medication Instructions:  Your physician recommends that you continue on your current medications as directed. Please refer to the Current Medication list given to you today.  *If you need a refill on your cardiac medications before your next appointment, please call your pharmacy*  Lab Work: Your physician recommends that you have lab work for [employer], urine light chains, and multiple myeloma profile at [facility]. If you have labs (blood work) drawn today and your tests are completely normal, you will receive your results only by: MyChart Message (if you have MyChart) OR A paper copy in the mail If you have any lab test that is abnormal or we need to change your treatment, we will call you to review the results.  Testing/Procedures: Your physician has requested that you have an echocardiogram. Echocardiography is a painless test that uses sound waves to create images of your heart. It provides your doctor with information about the size and shape of your heart and how well your heart's chambers and valves are working. This procedure takes approximately one hour. There are no restrictions for this procedure. Please do NOT wear cologne, perfume, aftershave, or lotions (deodorant is allowed). Please arrive 15 minutes prior to your appointment time.  Please note: We ask at that you not bring children with you during ultrasound (echo/ vascular) testing. Due to room size and safety concerns, children are not allowed in the ultrasound rooms during exams. Our front office staff cannot provide observation of children in our lobby area while testing is being conducted. An adult accompanying a patient to their appointment will only be allowed in the ultrasound room at the discretion of the ultrasound technician under special circumstances. We apologize for any inconvenience. Your physician has recommended that you wear an event monitor. Event monitors are medical devices that record the heart's electrical  activity. Doctors most often [place] these monitors to diagnose arrhythmias. Arrhythmias are problems with the speed or rhythm of the heartbeat. The monitor is a small, portable device. You can wear one while you do your normal daily activities. This is usually used to diagnose what is causing palpitations/syncope (passing out).  Follow-Up: At [facility], you and your health needs are our priority.  As part of our continuing mission to provide you with exceptional heart care, we have created designated Provider Care Teams.  These Care Teams include your primary Cardiologist (physician) and Advanced Practice Providers (APPs -  Physician Assistants and Nurse Practitioners) who all work together to provide you with the care you need, when you need it.  We recommend signing up for the patient portal called "MyChart".  Sign up information is provided on this After Visit Summary.  MyChart is used to connect with patients for Virtual Visits (Telemedicine).  Patients are able to view lab/test results, encounter notes, upcoming appointments, etc.  Non-urgent messages can be sent to your provider as well.   To learn more about what you can do with MyChart, go to [email or web address].    Your next appointment:   As needed after test  Provider:   Charlton Haws, MD     Other Instructions       1st Floor: - Lobby - Registration  - Pharmacy  - Lab - Cafe  2nd Floor: - PV Lab - Diagnostic Testing (echo, CT, nuclear med)  3rd Floor: - Vacant  4th Floor: - TCTS (cardiothoracic surgery) - AFib Clinic - Structural Heart Clinic - Vascular Surgery  - Vascular Ultrasound  5th Floor: [facility] (  general and EP) - Clinical Pharmacy for coumadin, hypertension, lipid, weight-loss medications, and med management appointments    Valet parking services will be available as well.

## 2023-07-02 DIAGNOSIS — F331 Major depressive disorder, recurrent, moderate: Secondary | ICD-10-CM | POA: Diagnosis not present

## 2023-07-02 DIAGNOSIS — F411 Generalized anxiety disorder: Secondary | ICD-10-CM | POA: Diagnosis not present

## 2023-07-02 DIAGNOSIS — I495 Sick sinus syndrome: Secondary | ICD-10-CM | POA: Diagnosis not present

## 2023-07-05 LAB — PROTEIN ELECTROPHORESIS, URINE REFLEX

## 2023-07-05 LAB — STATUS REPORT

## 2023-07-06 LAB — PROTEIN ELECTROPHORESIS, SERUM
A/G Ratio: 1.1 (ref 0.7–1.7)
Albumin ELP: 3.6 g/dL (ref 2.9–4.4)
Alpha 1: 0.3 g/dL (ref 0.0–0.4)
Alpha 2: 1 g/dL (ref 0.4–1.0)
Beta: 1 g/dL (ref 0.7–1.3)
Gamma Globulin: 0.9 g/dL (ref 0.4–1.8)
Globulin, Total: 3.2 g/dL (ref 2.2–3.9)
Total Protein: 6.8 g/dL (ref 6.0–8.5)

## 2023-07-06 LAB — KAPPA/LAMBDA LIGHT CHAINS
Ig Kappa Free Light Chain: 22.3 mg/L — ABNORMAL HIGH (ref 3.3–19.4)
Ig Lambda Free Light Chain: 21.7 mg/L (ref 5.7–26.3)
KAPPA/LAMBDA RATIO: 1.03 (ref 0.26–1.65)

## 2023-07-06 LAB — PROTEIN ELECTROPHORESIS, URINE REFLEX
Albumin ELP, Urine: 100 %
Alpha-1-Globulin, U: 0 %
Alpha-2-Globulin, U: 0 %
Beta Globulin, U: 0 %
Gamma Globulin, U: 0 %
Protein, Ur: 9 mg/dL

## 2023-07-06 LAB — MULTIPLE MYELOMA PROFILE

## 2023-07-07 ENCOUNTER — Encounter: Payer: Self-pay | Admitting: Cardiovascular Disease

## 2023-07-07 DIAGNOSIS — I495 Sick sinus syndrome: Secondary | ICD-10-CM

## 2023-07-07 DIAGNOSIS — G629 Polyneuropathy, unspecified: Secondary | ICD-10-CM

## 2023-07-07 DIAGNOSIS — I10 Essential (primary) hypertension: Secondary | ICD-10-CM

## 2023-07-07 DIAGNOSIS — G901 Familial dysautonomia [Riley-Day]: Secondary | ICD-10-CM

## 2023-07-08 ENCOUNTER — Other Ambulatory Visit: Payer: Self-pay

## 2023-07-08 ENCOUNTER — Ambulatory Visit (INDEPENDENT_AMBULATORY_CARE_PROVIDER_SITE_OTHER): Payer: PPO | Admitting: Obstetrics and Gynecology

## 2023-07-08 ENCOUNTER — Other Ambulatory Visit (HOSPITAL_COMMUNITY)
Admission: RE | Admit: 2023-07-08 | Discharge: 2023-07-08 | Disposition: A | Discharge status: Home / Self Care | Source: Ambulatory Visit | Attending: Obstetrics and Gynecology | Admitting: Obstetrics and Gynecology

## 2023-07-08 VITALS — BP 139/90 | HR 120

## 2023-07-08 DIAGNOSIS — M7918 Myalgia, other site: Secondary | ICD-10-CM | POA: Diagnosis not present

## 2023-07-08 DIAGNOSIS — Z1331 Encounter for screening for depression: Secondary | ICD-10-CM | POA: Diagnosis not present

## 2023-07-08 DIAGNOSIS — R102 Pelvic and perineal pain: Secondary | ICD-10-CM | POA: Diagnosis not present

## 2023-07-08 DIAGNOSIS — G8929 Other chronic pain: Secondary | ICD-10-CM

## 2023-07-08 DIAGNOSIS — N898 Other specified noninflammatory disorders of vagina: Secondary | ICD-10-CM | POA: Diagnosis not present

## 2023-07-08 DIAGNOSIS — D219 Benign neoplasm of connective and other soft tissue, unspecified: Secondary | ICD-10-CM | POA: Diagnosis not present

## 2023-07-08 DIAGNOSIS — B3731 Acute candidiasis of vulva and vagina: Secondary | ICD-10-CM | POA: Diagnosis not present

## 2023-07-08 NOTE — Progress Notes (Unsigned)
 GYNECOLOGY VISIT  Patient name: Katelyn [name] MRN [id number]  Date of birth: 12-Mar-1976 Chief Complaint:   Follow-up  History:  Katelyn [name] is a 48 y.o. G0P0000 being seen today for elvic pain - notes that last cycle was last week, 3d heavy with clots with last cycle had pain with tampon insertion.  N  Discussed the use of AI scribe software for clinical note transcription with the patient, who gave verbal consent to proceed.  History of Present Illness   The patient presents with pelvic pain and concerns about pelvic floor dysfunction.  She experiences significant pelvic pain, particularly on the right side, which worsens during menstruation and tampon use. The pain is more pronounced when removing a tampon than inserting it. This is the first time she has experienced such severe pain during her period.  She has a history of pelvic floor tenderness and previously underwent pelvic floor therapy with Julio Alm. Therapy was paused due to health issues and a focus on hip surgery recovery.  Various imaging studies, including MRIs and CTs, have monitored a pelvic tumor that grew rapidly between April and the last scan. There is also a persistent cyst on her left ovary.  She has a history of two C-sections, and during a tubal ligation, it was noted that her uterus is attached to the abdominal wall. She also has a prolapsed uterus as noted by pelvic floor therapy.  She is concerned about a possible yeast or bacterial infection, noting external symptoms that appear yeast-like.   She receives annual IV iron treatments at a cancer center due to low iron saturation levels, which were last checked in December. Her hemoglobin levels are normal, but iron saturation was low, prompting the recent treatment.  She has a family history of ovarian and uterine cancer on her maternal grandmother's side.  Her husband had to quit his job to provide her with 24-hour care due to her health  needs.       Past Medical History:  Diagnosis Date   Anemia    Anorexia    Anxiety    on meds   Asthma    controlled with meds   Benign juvenile melanoma    Chronic headaches    Colon polyps    found on colonoscopy 04/26/2012   Complication of anesthesia    itching after epidural for c section   Constipation    Depression    on meds   Depression    several suicide attempts, hospitaluzed in 2012 for this; hx pf ECT treatments ; pt sees Dr. Lolly Mustache pyschiatrist  and doing well on medication   Dysrhythmia    hx of PVC's (pt hasn't seen cardiologist since 2021- no follow up needed)   GERD (gastroesophageal reflux disease)    Headache    Heart murmur    as a child - no one mentions hearing murmur anymore, sometimes it is heard. Echo has been performed 02/03/18   Heartburn    no meds   History of pneumonia    x 3  years ago - no recent problems   Hx of blood clots 2019   bilateral LE   Hypertension    Left renal atrophy 12/19/2021   LEFT   Neuromuscular disorder (HCC)    small fiber neuropathy   Obesity    Pneumonia    Polycystic ovary    takes metformin to treat   Skin lesion    Excisional biopsy of moles - none  cancerous   Sleep apnea    yrs ago - diagnosed mild sleep apnea - did not have to use cpap     Past Surgical History:  Procedure Laterality Date   BREAST CAPSULECTOMY Bilateral 12/13/2020   1 week later pt had hematoma surgery on left breast   BREAST ENHANCEMENT SURGERY Bilateral 02/11/2017   BREATH TEK H PYLORI N/A 07/22/2013   Procedure: BREATH TEK H PYLORI;  Surgeon: Mariella Saa, MD;  Location: [facility];  Service: General;  Laterality: N/A;   CESAREAN SECTION  2004, 2007   x 2   COLONOSCOPY  2013   COLONOSCOPY  2021   MS-F/V-sutab(good)-[facility]   CYSTOSCOPY WITH URETHRAL DILATATION  age 89    GASTRIC ROUX-EN-Y N/A 10/31/2013   Procedure: LAPAROSCOPIC ROUX-EN-Y GASTRIC BYPASS WITH UPPER ENDOSCOPY;  Surgeon: Mariella Saa, MD;  Location:  [facility];  Service: General;  Laterality: N/A;   GLUTEUS MINIMUS REPAIR Left 05/28/2021   Procedure: Left GLUTEUS Medius REPAIR;  Surgeon: Huel Cote, MD;  Location: [facility];  Service: Orthopedics;  Laterality: Left;   ingrown toenail Right    Ingrown nail on great right toe   kidney stent  08/28/2014   mole excision     "benign juvenile melanoma" removed from left leg - inner thigh   POLYPECTOMY     2013   SKIN LESION EXCISION     back   TONSILLECTOMY  age 53   TUBAL LIGATION  2018    The following portions of the patient's history were reviewed and updated as appropriate: allergies, current medications, past family history, past medical history, past social history, past surgical history and problem list.   Health Maintenance:   Last pap not seen on file Last mammogram: history result, ordered but not scheduled   Review of Systems:  Pertinent items are noted in HPI. Comprehensive review of systems was otherwise negative.   Objective:  Physical Exam BP (!) 139/90   Pulse (!) 120   LMP 06/23/2023 (Exact Date)    Physical Exam Vitals and nursing note reviewed.  Constitutional:      Appearance: Normal appearance.  HENT:     Head: Normocephalic and atraumatic.  Pulmonary:     Effort: Pulmonary effort is normal.  Skin:    General: Skin is warm and dry.  Neurological:     General: No focal deficit present.     Mental Status: She is alert.  Psychiatric:        Mood and Affect: Mood normal.        Behavior: Behavior normal.        Thought Content: Thought content normal.        Judgment: Judgment normal.        Assessment & Plan:   Assessment and Plan    Pelvic Pain Chronic right-sided pelvic pain likely due to pelvic floor muscle tightness and possible pudendal nerve involvement. Previous imaging showed uterine fibroid and left ovarian cyst. Family history of ovarian and uterine cancer. Noted that fibroids are benign and ovarian cyst appeared benign on prior CT  scan - Order pelvic ultrasound to reassess cyst and  - Refer to pelvic floor therapy with Ottis Stain. - Prescribe suppository for trial to manage pelvic pain. - Consider pudendal nerve block if suppository and therapy are ineffective.  Vaginal Infection Possible yeast or bacterial infection with external symptoms resembling yeast. Internal pain suggests possible bacterial vaginosis. - Perform self-swab to differentiate between yeast infection and BV.  Iron  Deficiency Reports low iron saturation levels requiring annual IV iron treatments. Recent levels low, prompting another round of IV iron. Oncologist monitors levels. Notes that other provider was wondering if iron deficiency due to fibroid.  No surgical intervention needed as hemoglobin levels normal. - Review recent iron level results from oncologist. - Coordinate with oncologist to assess need for further intervention.       Routine preventative health maintenance measures emphasized.  Lorriane Shire, MD [facility], [facility]

## 2023-07-08 NOTE — Telephone Encounter (Signed)
-----   Message from Charlton Haws sent at 07/08/2023 12:04 PM EDT ----- Can order cardiac MRI r/o amyloid and refer to hematology for w/u non ATTR amyloid ----- Message ----- From: Ethelda Chick, RN Sent: 07/08/2023   9:42 AM EDT To: Wendall Stade, MD  Patient called patient. She was concerned about Ig Kappa Free Light Chain being elevated and the Albumin ELP, Urine being 100, she stated normal is in the 30's. Patient also was wondering if this was the testing for amyloidosis or is there another test to do. Will forward to Dr. Eden Emms for advisement. Patient stated you could send a mychart of response.

## 2023-07-09 LAB — CERVICOVAGINAL ANCILLARY ONLY
Bacterial Vaginitis (gardnerella): NEGATIVE
Candida Glabrata: NEGATIVE
Candida Vaginitis: POSITIVE — AB
Comment: NEGATIVE
Comment: NEGATIVE
Comment: NEGATIVE

## 2023-07-09 MED ORDER — AMBULATORY NON FORMULARY MEDICATION: 0 refills | Status: AC

## 2023-07-09 MED ORDER — FLUCONAZOLE 150 MG PO TABS: 150.0000 mg | ORAL_TABLET | Freq: Once | ORAL | 2 refills | Status: AC

## 2023-07-09 NOTE — Addendum Note (Signed)
 Addended by: Frutoso Schatz on: 07/09/2023 08:08 AM   Modules accepted: Orders

## 2023-07-10 ENCOUNTER — Encounter: Payer: Self-pay | Admitting: Obstetrics and Gynecology

## 2023-07-10 ENCOUNTER — Ambulatory Visit (HOSPITAL_COMMUNITY)
Admission: RE | Admit: 2023-07-10 | Discharge: 2023-07-10 | Disposition: A | Discharge status: Home / Self Care | Payer: PPO | Source: Ambulatory Visit | Attending: Cardiovascular Disease | Admitting: Cardiovascular Disease

## 2023-07-10 DIAGNOSIS — I495 Sick sinus syndrome: Secondary | ICD-10-CM | POA: Insufficient documentation

## 2023-07-10 LAB — ECHOCARDIOGRAM COMPLETE
AR max vel: 1.93 cm2
AV Area VTI: 2.15 cm2
AV Area mean vel: 2.1 cm2
AV Mean grad: 5 mmHg
AV Peak grad: 9.9 mmHg
Ao pk vel: 1.57 m/s
Area-P 1/2: 5.31 cm2
S' Lateral: 2.7 cm

## 2023-07-15 ENCOUNTER — Other Ambulatory Visit: Payer: Self-pay | Admitting: Family Medicine

## 2023-07-16 ENCOUNTER — Ambulatory Visit (HOSPITAL_COMMUNITY)
Admission: RE | Admit: 2023-07-16 | Discharge: 2023-07-16 | Disposition: A | Discharge status: Home / Self Care | Source: Ambulatory Visit | Attending: Obstetrics and Gynecology | Admitting: Obstetrics and Gynecology

## 2023-07-16 DIAGNOSIS — D219 Benign neoplasm of connective and other soft tissue, unspecified: Secondary | ICD-10-CM | POA: Insufficient documentation

## 2023-07-16 DIAGNOSIS — N858 Other specified noninflammatory disorders of uterus: Secondary | ICD-10-CM | POA: Diagnosis not present

## 2023-07-16 DIAGNOSIS — D259 Leiomyoma of uterus, unspecified: Secondary | ICD-10-CM | POA: Diagnosis not present

## 2023-07-16 DIAGNOSIS — N852 Hypertrophy of uterus: Secondary | ICD-10-CM | POA: Diagnosis not present

## 2023-07-16 DIAGNOSIS — R102 Pelvic and perineal pain: Secondary | ICD-10-CM | POA: Diagnosis not present

## 2023-07-20 DIAGNOSIS — F411 Generalized anxiety disorder: Secondary | ICD-10-CM | POA: Diagnosis not present

## 2023-07-20 DIAGNOSIS — F331 Major depressive disorder, recurrent, moderate: Secondary | ICD-10-CM | POA: Diagnosis not present

## 2023-07-23 ENCOUNTER — Telehealth: Payer: Self-pay

## 2023-07-23 ENCOUNTER — Other Ambulatory Visit: Payer: Self-pay

## 2023-07-23 DIAGNOSIS — D72829 Elevated white blood cell count, unspecified: Secondary | ICD-10-CM

## 2023-07-23 DIAGNOSIS — R7989 Other specified abnormal findings of blood chemistry: Secondary | ICD-10-CM

## 2023-07-23 DIAGNOSIS — D509 Iron deficiency anemia, unspecified: Secondary | ICD-10-CM

## 2023-07-23 DIAGNOSIS — D513 Other dietary vitamin B12 deficiency anemia: Secondary | ICD-10-CM

## 2023-07-23 DIAGNOSIS — I472 Ventricular tachycardia, unspecified: Secondary | ICD-10-CM

## 2023-07-23 DIAGNOSIS — D508 Other iron deficiency anemias: Secondary | ICD-10-CM

## 2023-07-23 DIAGNOSIS — D7589 Other specified diseases of blood and blood-forming organs: Secondary | ICD-10-CM

## 2023-07-23 MED ORDER — METOPROLOL TARTRATE 25 MG PO TABS: 25.0000 mg | ORAL_TABLET | Freq: Two times a day (BID) | ORAL | 1 refills | Status: DC

## 2023-07-23 NOTE — Telephone Encounter (Signed)
   Cardiac Monitor Alert  Date of alert:  07/23/2023   Patient Name: Katelyn [name]  DOB: Aug 28, 1975  MRN: [id number]   [facility] Cardiologist: Charlton Haws, MD  [facility] EP:  None    Monitor Information: Cardiac Event Monitor [[employer]]  Reason:  14 beat run of VT Ordering provider:  Dr Eden Emms :1}  Alert Ventricular Tachycardia This is the 1st alert for this rhythm.   No follow up appointment currently scheduled, upcoming cardiac MRI on 08/14/23 along with completion of monitor results for Dr Eden Emms to review.   The patient was contacted today.  She is symptomatic.  She reports the following symptoms:  Lightheaded, shortness of breath, and palpitations. Arrhythmia, symptoms and history reviewed with Dr Odis Hollingshead ([facility]).   Plan:  Increase metoprolol tartrate to 25 mg twice daily    Other: Spoke with patient, this occurred when she arose from a sitting to a standing position. Patient states she felt lightheaded, slight shortness of breath, and palpitations but lasted less than a minute. Patient currently on metoprolol tartrate 12.5 twice daily, Dr Odis Hollingshead ([facility]) increased to 25 mg twice daily. Patient agrees with plan, sent in updated prescription to [facility]    Festus Holts, RN  07/23/2023 11:48 AM

## 2023-07-24 ENCOUNTER — Inpatient Hospital Stay: Payer: PPO | Attending: Physician Assistant

## 2023-07-24 DIAGNOSIS — D508 Other iron deficiency anemias: Secondary | ICD-10-CM | POA: Insufficient documentation

## 2023-07-24 DIAGNOSIS — M25552 Pain in left hip: Secondary | ICD-10-CM | POA: Diagnosis not present

## 2023-07-24 DIAGNOSIS — D509 Iron deficiency anemia, unspecified: Secondary | ICD-10-CM

## 2023-07-24 DIAGNOSIS — I495 Sick sinus syndrome: Secondary | ICD-10-CM | POA: Insufficient documentation

## 2023-07-24 DIAGNOSIS — Z7983 Long term (current) use of bisphosphonates: Secondary | ICD-10-CM | POA: Insufficient documentation

## 2023-07-24 DIAGNOSIS — E785 Hyperlipidemia, unspecified: Secondary | ICD-10-CM | POA: Insufficient documentation

## 2023-07-24 DIAGNOSIS — R0602 Shortness of breath: Secondary | ICD-10-CM | POA: Diagnosis not present

## 2023-07-24 DIAGNOSIS — R7989 Other specified abnormal findings of blood chemistry: Secondary | ICD-10-CM

## 2023-07-24 DIAGNOSIS — D75839 Thrombocytosis, unspecified: Secondary | ICD-10-CM | POA: Insufficient documentation

## 2023-07-24 DIAGNOSIS — D72829 Elevated white blood cell count, unspecified: Secondary | ICD-10-CM | POA: Diagnosis not present

## 2023-07-24 DIAGNOSIS — D513 Other dietary vitamin B12 deficiency anemia: Secondary | ICD-10-CM

## 2023-07-24 DIAGNOSIS — D7589 Other specified diseases of blood and blood-forming organs: Secondary | ICD-10-CM

## 2023-07-24 LAB — CBC WITH DIFFERENTIAL/PLATELET
Abs Immature Granulocytes: 0.35 10*3/uL — ABNORMAL HIGH (ref 0.00–0.07)
Basophils Absolute: 0.1 10*3/uL (ref 0.0–0.1)
Basophils Relative: 1 %
Eosinophils Absolute: 0.4 10*3/uL (ref 0.0–0.5)
Eosinophils Relative: 3 %
HCT: 41.6 % (ref 36.0–46.0)
Hemoglobin: 13.6 g/dL (ref 12.0–15.0)
Immature Granulocytes: 3 %
Lymphocytes Relative: 25 %
Lymphs Abs: 2.9 10*3/uL (ref 0.7–4.0)
MCH: 32.2 pg (ref 26.0–34.0)
MCHC: 32.7 g/dL (ref 30.0–36.0)
MCV: 98.3 fL (ref 80.0–100.0)
Monocytes Absolute: 0.7 10*3/uL (ref 0.1–1.0)
Monocytes Relative: 6 %
Neutro Abs: 7.1 10*3/uL (ref 1.7–7.7)
Neutrophils Relative %: 62 %
Platelets: 477 10*3/uL — ABNORMAL HIGH (ref 150–400)
RBC: 4.23 MIL/uL (ref 3.87–5.11)
RDW: 12.9 % (ref 11.5–15.5)
WBC: 11.5 10*3/uL — ABNORMAL HIGH (ref 4.0–10.5)
nRBC: 0 % (ref 0.0–0.2)

## 2023-07-24 LAB — COMPREHENSIVE METABOLIC PANEL WITH GFR
ALT: 21 U/L (ref 0–44)
AST: 23 U/L (ref 15–41)
Albumin: 3.6 g/dL (ref 3.5–5.0)
Alkaline Phosphatase: 94 U/L (ref 38–126)
Anion gap: 10 (ref 5–15)
BUN: 14 mg/dL (ref 6–20)
CO2: 27 mmol/L (ref 22–32)
Calcium: 9 mg/dL (ref 8.9–10.3)
Chloride: 103 mmol/L (ref 98–111)
Creatinine, Ser: 0.83 mg/dL (ref 0.44–1.00)
GFR, Estimated: >60 mL/min (ref >60)
Glucose, Bld: 86 mg/dL (ref 70–99)
Potassium: 4.1 mmol/L (ref 3.5–5.1)
Sodium: 140 mmol/L (ref 135–145)
Total Bilirubin: 0.4 mg/dL (ref 0.0–1.2)
Total Protein: 6.9 g/dL (ref 6.5–8.1)

## 2023-07-24 LAB — IRON AND TIBC
Iron: 75 ug/dL (ref 28–170)
Saturation Ratios: 31 % (ref 10.4–31.8)
TIBC: 245 ug/dL — ABNORMAL LOW (ref 250–450)
UIBC: 170 ug/dL

## 2023-07-24 LAB — VITAMIN B12: Vitamin B-12: 1163 pg/mL — ABNORMAL HIGH (ref 180–914)

## 2023-07-24 LAB — C-REACTIVE PROTEIN: CRP: 4 mg/dL — ABNORMAL HIGH (ref <1.0)

## 2023-07-24 LAB — VITAMIN D 25 HYDROXY (VIT D DEFICIENCY, FRACTURES): Vit D, 25-Hydroxy: 36.74 ng/mL (ref 30–100)

## 2023-07-24 LAB — FOLATE: Folate: 17.2 ng/mL (ref >5.9)

## 2023-07-24 LAB — SEDIMENTATION RATE: Sed Rate: 42 mm/h — ABNORMAL HIGH (ref 0–22)

## 2023-07-24 LAB — FERRITIN: Ferritin: 320 ng/mL — ABNORMAL HIGH (ref 11–307)

## 2023-07-24 NOTE — Telephone Encounter (Signed)
 Wendall Stade, MD  Ethelda Chick, RN Have her see EP for some atypical VT  This note per Dr. Eden Emms after reviewing monitor that was scanned.

## 2023-07-24 NOTE — Telephone Encounter (Signed)
 Left message for patient to call back

## 2023-07-25 LAB — HOMOCYSTEINE: Homocysteine: 7.2 umol/L (ref 0.0–14.5)

## 2023-07-27 ENCOUNTER — Telehealth: Payer: Self-pay | Admitting: Neurology

## 2023-07-27 ENCOUNTER — Ambulatory Visit: Payer: PPO | Admitting: Diagnostic Neuroimaging

## 2023-07-27 ENCOUNTER — Encounter: Payer: Self-pay | Admitting: Obstetrics and Gynecology

## 2023-07-27 ENCOUNTER — Telehealth: Payer: Self-pay | Admitting: Pharmacist

## 2023-07-27 ENCOUNTER — Encounter: Payer: Self-pay | Admitting: Diagnostic Neuroimaging

## 2023-07-27 VITALS — BP 138/92 | HR 95 | Ht 65.0 in | Wt 245.0 lb

## 2023-07-27 DIAGNOSIS — G43109 Migraine with aura, not intractable, without status migrainosus: Secondary | ICD-10-CM

## 2023-07-27 MED ORDER — QULIPTA 60 MG PO TABS: 60.0000 mg | ORAL_TABLET | Freq: Every day | ORAL | 12 refills | Status: DC

## 2023-07-27 MED ORDER — NURTEC 75 MG PO TBDP: 75.0000 mg | ORAL_TABLET | Freq: Every day | ORAL | 6 refills | Status: DC | PRN

## 2023-07-27 MED ORDER — RIZATRIPTAN BENZOATE 10 MG PO TBDP
10.0000 mg | ORAL_TABLET | ORAL | 11 refills | Status: AC | PRN
Start: 2023-07-27 — End: ?

## 2023-07-27 NOTE — Telephone Encounter (Signed)
 MD wanted the pt to complete the Hattr genetic testing through [facility]. Reviewed the process with the pt. Completed form and made a copy. Advised the pt how to collect and send off the sample.

## 2023-07-27 NOTE — Telephone Encounter (Signed)
 Patient was returning call. Please advise ?

## 2023-07-27 NOTE — Patient Instructions (Signed)
 MIGRAINE PREVENTION  LIFESTYLE CHANGES -Stop or avoid smoking -Decrease or avoid caffeine / alcohol -Eat and sleep on a regular schedule -Exercise several times per week - atogepant Bennie Pierini) 60mg  daily  Consider - erenumab (Aimovig) 70mg  monthly (may increase to 140mg  monthly) - fremanezumab (Ajovy) 225mg  monthly (or 675mg  every 3 months) - galazanezumab (Emgality) 240mg  loading dose; then 120mg  monthly  MIGRAINE RESCUE  - ibuprofen, tylenol as needed - rizatriptan (Maxalt) 10mg  as needed for breakthrough headache; may repeat x 1 after 2 hours; max 2 tabs per day or 8 per month - rimegepant (Nurtec) 75mg  as needed for breakthrough headache; max 8 per month  IDIOPATHIC SMALL FIBER NEUROPATHY - continue gabapentin, oxycodone per pain mgmt - check hATTR gene testing  DYSAUTONOMIA - follow up with cardiology and [facility]
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
## 2023-07-27 NOTE — Telephone Encounter (Signed)
 Pharmacy Patient Advocate Encounter   Received notification from Patient Pharmacy that prior authorization for Nurtec 75MG  dispersible tablets is required/requested.   Insurance verification completed.   The patient is insured through  [employer]  .   Per test claim: PA required; PA submitted to above mentioned insurance via CoverMyMeds Key/confirmation #/EOC [id number] Status is pending

## 2023-07-27 NOTE — Telephone Encounter (Signed)
 Spoke with patient and she is aware of Dr. Fabio Bering recommendation.   Will forward to EP scheduling to get her scheduled

## 2023-07-27 NOTE — Addendum Note (Signed)
 Addended by: Virl Axe, Zacariah Belue L on: 07/27/2023 12:46 PM   Modules accepted: Orders

## 2023-07-28 ENCOUNTER — Other Ambulatory Visit: Payer: Self-pay | Admitting: Obstetrics and Gynecology

## 2023-07-28 MED ORDER — UBRELVY 50 MG PO TABS: 50.0000 mg | ORAL_TABLET | ORAL | 6 refills | Status: DC | PRN

## 2023-07-28 NOTE — Addendum Note (Signed)
 Addended by: Joycelyn Schmid R on: 07/28/2023 02:57 PM   Modules accepted: Orders

## 2023-07-28 NOTE — Telephone Encounter (Signed)
 Okay to switch to [place].  Meds ordered this encounter  Medications   Ubrogepant (UBRELVY) 50 MG TABS    Sig: Take 1 tablet (50 mg total) by mouth as needed. May repeat x 1 tab after 2 hours; max 2 tabs per day or 8 per month    Dispense:  8 tablet    Refill:  6    Suanne Marker, MD 07/28/2023, 2:57 PM Certified in Neurology, Neurophysiology and Neuroimaging  [facility] [address] [place], [place] [address] [phone]

## 2023-07-28 NOTE — Telephone Encounter (Signed)
 Dr Marjory Lies, ok to ask patient if she is ok with switching to formulary alternative, Bernita Raisin?

## 2023-07-28 NOTE — Telephone Encounter (Signed)
 Pharmacy Patient Advocate Encounter  Received notification from  [employer]  that Prior Authorization for Nurtec ODT has been DENIED.  Full denial letter will be uploaded to the media tab. See denial reason below.   PA #/Case ID/Reference #: [id number]

## 2023-07-28 NOTE — Telephone Encounter (Signed)
 I called the patient and LVM (ok per DPR) advising that her insurance didn't approve the Nurtec but they do prefer Ubrelvy instead. I provided Bernita Raisin instructions in the message and asked patient to let us know that she received this message and let us know if she has any questions. I also told pt that I am sending detailed drug information for Bernita Raisin to her mychart.

## 2023-07-29 ENCOUNTER — Telehealth: Payer: Self-pay

## 2023-07-29 ENCOUNTER — Encounter: Payer: Self-pay | Admitting: *Deleted

## 2023-07-29 ENCOUNTER — Encounter: Payer: Self-pay | Admitting: Hematology

## 2023-07-29 ENCOUNTER — Other Ambulatory Visit (HOSPITAL_COMMUNITY): Payer: Self-pay

## 2023-07-29 LAB — METHYLMALONIC ACID, SERUM: Methylmalonic Acid, Quantitative: 258 nmol/L (ref 0–378)

## 2023-07-29 NOTE — Telephone Encounter (Signed)
 Pharmacy Patient Advocate Encounter   Received notification from Physician's Office that prior authorization for Ubrelvy 50MG  tablets is required/requested.   Insurance verification completed.   The patient is insured through [facility] [employer] ADVANTAGE/[employer] .   Per test claim: PA required; PA submitted to above mentioned insurance via CoverMyMeds Key/confirmation #/EOC [id number] Status is pending

## 2023-07-29 NOTE — Telephone Encounter (Signed)
 Bernita Raisin ok to use with current kidney function based on lab testing. VRP

## 2023-07-29 NOTE — Telephone Encounter (Signed)
 Pharmacy Patient Advocate Encounter   Received notification from CoverMyMeds that prior authorization for Qulipta 60MG  tablets is required/requested.   Insurance verification completed.   The patient is insured through [facility] ADVANTAGE/RX ADVANCE .   Per test claim: PA required; PA submitted to above mentioned insurance via CoverMyMeds Key/confirmation #/EOC [id number] Status is pending

## 2023-07-30 ENCOUNTER — Encounter: Payer: Self-pay | Admitting: Obstetrics and Gynecology

## 2023-07-30 ENCOUNTER — Encounter: Payer: Self-pay | Admitting: Hematology

## 2023-07-30 ENCOUNTER — Other Ambulatory Visit (HOSPITAL_COMMUNITY): Payer: Self-pay

## 2023-07-30 NOTE — Telephone Encounter (Signed)
 Pharmacy Patient Advocate Encounter  Received notification from [facility] ADVANTAGE/[employer] that Prior Authorization for Ubrelvy 50MG  tablets has been APPROVED from 07/29/2023 to 10/28/2023   PA #/Case ID/Reference #: PA Case ID #: [id number]  Tried to run test claim but got a rejection-had to call the plan and have them reverse the claim.   Call reference [phone]

## 2023-07-30 NOTE — Telephone Encounter (Signed)
 Pharmacy Patient Advocate Encounter  Received notification from [facility] ADVANTAGE/RX ADVANCE that Prior Authorization for Qulipta 60MG  tablets has been APPROVED from 07/29/2023 to 01/25/2024   PA #/Case ID/Reference #: PA Case ID #: [id number]  Could not run test claim due to a rejection when running test claim earlier and having to call plan to have them reverse the claim.  Call Reference# [phone]

## 2023-07-31 ENCOUNTER — Inpatient Hospital Stay: Payer: PPO | Attending: Physician Assistant | Admitting: Oncology

## 2023-07-31 ENCOUNTER — Encounter: Payer: Self-pay | Admitting: Oncology

## 2023-07-31 DIAGNOSIS — D509 Iron deficiency anemia, unspecified: Secondary | ICD-10-CM

## 2023-07-31 DIAGNOSIS — D513 Other dietary vitamin B12 deficiency anemia: Secondary | ICD-10-CM

## 2023-07-31 DIAGNOSIS — D508 Other iron deficiency anemias: Secondary | ICD-10-CM | POA: Diagnosis not present

## 2023-07-31 DIAGNOSIS — Z9884 Bariatric surgery status: Secondary | ICD-10-CM | POA: Insufficient documentation

## 2023-07-31 DIAGNOSIS — D75839 Thrombocytosis, unspecified: Secondary | ICD-10-CM | POA: Insufficient documentation

## 2023-07-31 DIAGNOSIS — R7989 Other specified abnormal findings of blood chemistry: Secondary | ICD-10-CM | POA: Diagnosis not present

## 2023-07-31 DIAGNOSIS — D7589 Other specified diseases of blood and blood-forming organs: Secondary | ICD-10-CM

## 2023-07-31 DIAGNOSIS — D72829 Elevated white blood cell count, unspecified: Secondary | ICD-10-CM | POA: Diagnosis not present

## 2023-07-31 NOTE — Progress Notes (Signed)
 History of Present Illness: Patient is a 48 year old female who is followed by cancer center for leukocytosis.  She has past medical history significant for GAD, anorexia, PCOS, headaches, pelvic floor dysfunction, hip pain and low back pain and small fiber neuropathy.  She was last seen in clinic on 05/01/2023.  Since her last visit, she denies any hospitalizations, surgeries or changes to her baseline health.  She met with infectious disease on 04/14/2023 for follow-up for viral meningitis, persistent leukocytosis and headaches.  Per note, it appears that ID does not feel that persistent leukocytosis is related to previous history of meningitis.  She met with gynecology on 04/16/2023 for pudendal neuralgia and excruciating pelvic pain.  They discussed pelvic floor Botox versus vaginal Valium but she would have to discontinue oral benzodiazepines which she was okay with.  MD is concerned for Botox given poor reactions to medications in the past.  She has follow-up with them at the end of the month and they have discussed hysterectomy but right now she is not interested in having that surgery.  Patient was seen by [facility] brain and spine surgery by Dr. Dierdre Searles (02/24/2023) for spondylosis of lumbar spine.  She has received previous injections in the past but these were held secondary to elevated white count.  Previously they have discussed spinal cord stimulator but they recommend injections first.  She is currently on narcotics for these pains.  Had MRI of brain on 04/19/23 for dizziness, headaches and balance concerns which showed no evidence of an acute intracranial abnormality. Cerebellar tonsillar ectopia as described and unchanged from the prior MRI of 12/07/2017. Otherwise unremarkable MRI appearance of the brain.  She had a bone marrow biopsy (01/27/2023) which showed no significant CD34 positive blastic population.  No monoclonal B cells.  Normocellular bone marrow.  Findings were nonspecific  could be secondary to infection versus immune mediated process versus medication.  There was no identifiable medication to cause persistent leukocytosis.  She was referred to cardiology for palpitations.  She was placed on 30-day event monitor.   She was referred to a specialist whom she sees in a couple of weeks for several runs of V. tach.  She had an echocardiogram which showed an EF of 60 to 65%.  Left ventricular diastolic parameters are consistent with grade 1 diastolic dysfunction.  Mild calcification of aortic valve.  Mild thickening of aortic valve.  No regurgitation.  No aortic stenosis. She is also scheduled for a cardiac MRI on 08/14/2023.  Cardiology would like to rule out amyloidosis.  Today, she reports more or less feeling about the same.  Appetite is 50% energy levels are 30%.  Has 10 out of 10 chronic pain.  Reports her husband had to quit work to take care of her given she continues to decline.  Reports her over the past few months having trouble swallowing and has aspirated on several occasions.  Reports she has acid reflux and occasionally will wake up in the middle of the night and have to throw up.    She continues to spend most of her time lying down because it hurts to sit.  Reports she uses a walker when she walks long distances because she gives out easily.  Patient had left hip surgery back in January 2023 and had concerns for possible infection left hip hardware.  She had an MRI of her hip on 05/14/23 which showed no signs of a hip fracture, dislocation or avascular necrosis.  Mid to moderate osteoarthritis  of left hip.  She was referred back to [facility] for small fiber neuropathy and sees them at the end of April.  Reports she does not sleep well at night due to nap she takes throughout the day.   Observations/Objective:  Review of Systems  Constitutional:  Positive for chills, fever and malaise/fatigue.  Respiratory:  Positive for cough, sputum production and shortness of  breath.   Cardiovascular:  Positive for chest pain and palpitations.  Gastrointestinal:  Positive for constipation, nausea and vomiting.  Musculoskeletal:  Positive for back pain and joint pain. Negative for falls.  Neurological:  Positive for dizziness, tingling and headaches.  Psychiatric/Behavioral:  The patient is nervous/anxious and has insomnia.    Physical Exam Vitals reviewed.  Constitutional:      Appearance: She is obese.  HENT:     Right Ear: Tympanic membrane normal.     Left Ear: Tympanic membrane normal.     Nose: Nose normal.     Mouth/Throat:     Pharynx: No oropharyngeal exudate.  Cardiovascular:     Rate and Rhythm: Normal rate.  Pulmonary:     Effort: Pulmonary effort is normal.     Breath sounds: Normal breath sounds.  Abdominal:     General: Bowel sounds are normal.     Palpations: Abdomen is soft.  Neurological:     Mental Status: She is alert and oriented to person, place, and time.     Assessment and Plan: 1. Leukocytosis, unspecified type (Primary) -Lab work from 07/24/2018 shows slight improvement of white blood cell count now at 11.5 (12.9) with normal ANC.  -Bone marrow biopsy from 01/27/2023 showed no significant CD3 4 positive blastic population identified.  T cells with nonspecific changes.  No monoclonal B-cell population identified.  Normocellular bone marrow for age with trilineage hematopoiesis.  There are mild dyspoietic changes involving the granular lytic cell lines with no increase in blastic cells.  The findings are not considered specific or diagnostic of a myeloid neoplasm and may be secondary in nature to infection, immune mediated process or medication.   -We discussed results in detail.   -Previous CRP and sed rate were elevated likely indicating inflammation. -Continue to favor elevated white count secondary to inflammatory state.  2. Elevated platelet count -Labs from 07/24/2023 show a platelet count of 477 (436 ).  3. Iron  deficiency anemia, unspecified iron deficiency anemia type -She received 2 doses of IV Venofer 200 mg on 05/29/2023 and 06/05/2023.  Nausea and vomiting for 2 to 3 days following her infusions. -Labs from 07/24/2023 show hemoglobin of 13.6, ferritin 320 iron saturation is 31%.  TIBC is low at 245. -IDA likely secondary to previous bariatric surgery. -Vitamin B 12 level continues to be elevated (1163) but has significantly improved over the course of the past few months.  MMA is 258. -She does not need any additional iron at this time.  B12 level would be elevated due to chronic inflammation.   4. Chronic pain syndrome -Continue follow-up with [facility].  -Continue narcotics as prescribed. -Continue taking stool softener or laxative.  5.  Left hip pain -Status post gluteal medius repair with bursitis on 05/28/2021. -Per op review 2 anchors were placed along with an implant. -She has concerns given poor toleration of medications that she may be rejecting the foreign body that was placed in her hip. -MRI of right hip on 05/13/2023 showed no hip fracture, dislocation or avascular necrosis.  Mild to moderate osteoarthritis of the left hip. -  Reports worsening pain over the past 6 months or so. -Recommend follow-up with Dr. Steward Drone.   6.  Tachybradycardia syndrome -Patient had echocardiogram on 07/10/2023 which showed ejection fraction 60 to 65%.  Left ventricular diastolic parameters are consistent with grade 1 diastolic dysfunction.  Mild calcification of aortic valve.  Mild thickening of aortic valve.  No regurgitation.  No aortic stenosis. -She is scheduled for a cardiac MRI on 08/14/2023. -Systemic signs of undiagnosed dysautonomia.  She had a 30-day cardiac monitor.  No clear signs of POTS.  -Screened for amyloidosis with negative SPEP/UPEP and light chains.  No results from cardiac event monitor.  -Patient self reports several episodes of V. tach and she was referred to a specialist.  She sees them at  the end of the month.  Follow Up Instructions: -She does not need any additional iron at this time. -Lab work has actually improved we will continue monitoring labs.  Patient would prefer every 13-month lab work and we will see her back in 4 months.   I discussed the assessment and treatment plan with the patient. The patient was provided an opportunity to ask questions and all were answered. The patient agreed with the plan and demonstrated an understanding of the instructions.   The patient was advised to call back or seek an in-person evaluation if the symptoms worsen or if the condition fails to improve as anticipated.  I spent 25 minutes dedicated to the care of this patient (face-to-face and non-face-to-face) on the date of the encounter to include what is described in the assessment and plan.   Mauro Kaufmann, NP

## 2023-08-02 ENCOUNTER — Encounter: Payer: Self-pay | Admitting: Diagnostic Neuroimaging

## 2023-08-04 ENCOUNTER — Ambulatory Visit: Attending: Cardiovascular Disease

## 2023-08-04 DIAGNOSIS — I495 Sick sinus syndrome: Secondary | ICD-10-CM

## 2023-08-05 DIAGNOSIS — F411 Generalized anxiety disorder: Secondary | ICD-10-CM | POA: Diagnosis not present

## 2023-08-05 DIAGNOSIS — F331 Major depressive disorder, recurrent, moderate: Secondary | ICD-10-CM | POA: Diagnosis not present

## 2023-08-07 DIAGNOSIS — Z9884 Bariatric surgery status: Secondary | ICD-10-CM | POA: Diagnosis not present

## 2023-08-07 DIAGNOSIS — G4733 Obstructive sleep apnea (adult) (pediatric): Secondary | ICD-10-CM | POA: Diagnosis not present

## 2023-08-07 DIAGNOSIS — F32A Depression, unspecified: Secondary | ICD-10-CM | POA: Diagnosis not present

## 2023-08-07 DIAGNOSIS — Z87891 Personal history of nicotine dependence: Secondary | ICD-10-CM | POA: Diagnosis not present

## 2023-08-07 DIAGNOSIS — E785 Hyperlipidemia, unspecified: Secondary | ICD-10-CM | POA: Diagnosis not present

## 2023-08-07 DIAGNOSIS — F419 Anxiety disorder, unspecified: Secondary | ICD-10-CM | POA: Diagnosis not present

## 2023-08-07 DIAGNOSIS — I10 Essential (primary) hypertension: Secondary | ICD-10-CM | POA: Diagnosis not present

## 2023-08-07 DIAGNOSIS — J45909 Unspecified asthma, uncomplicated: Secondary | ICD-10-CM | POA: Diagnosis not present

## 2023-08-07 DIAGNOSIS — G608 Other hereditary and idiopathic neuropathies: Secondary | ICD-10-CM | POA: Diagnosis not present

## 2023-08-07 DIAGNOSIS — Z86718 Personal history of other venous thrombosis and embolism: Secondary | ICD-10-CM | POA: Diagnosis not present

## 2023-08-07 DIAGNOSIS — Z79899 Other long term (current) drug therapy: Secondary | ICD-10-CM | POA: Diagnosis not present

## 2023-08-07 NOTE — Telephone Encounter (Signed)
 Hey Dr. Anders Simmonds,   Can you look at her chart?   Durenda Hurt, NP 08/07/2023 11:24 AM

## 2023-08-10 DIAGNOSIS — F411 Generalized anxiety disorder: Secondary | ICD-10-CM | POA: Diagnosis not present

## 2023-08-10 DIAGNOSIS — F331 Major depressive disorder, recurrent, moderate: Secondary | ICD-10-CM | POA: Diagnosis not present

## 2023-08-10 DIAGNOSIS — F410 Panic disorder [episodic paroxysmal anxiety] without agoraphobia: Secondary | ICD-10-CM | POA: Diagnosis not present

## 2023-08-12 ENCOUNTER — Encounter (HOSPITAL_COMMUNITY): Payer: Self-pay

## 2023-08-14 ENCOUNTER — Other Ambulatory Visit: Payer: Self-pay | Admitting: Cardiovascular Disease

## 2023-08-14 ENCOUNTER — Ambulatory Visit (HOSPITAL_COMMUNITY)
Admission: RE | Admit: 2023-08-14 | Discharge: 2023-08-14 | Disposition: A | Discharge status: Home / Self Care | Source: Ambulatory Visit | Attending: Cardiovascular Disease | Admitting: Cardiovascular Disease

## 2023-08-14 DIAGNOSIS — G901 Familial dysautonomia [Riley-Day]: Secondary | ICD-10-CM | POA: Diagnosis not present

## 2023-08-14 DIAGNOSIS — G629 Polyneuropathy, unspecified: Secondary | ICD-10-CM | POA: Diagnosis not present

## 2023-08-14 DIAGNOSIS — I495 Sick sinus syndrome: Secondary | ICD-10-CM

## 2023-08-14 DIAGNOSIS — I10 Essential (primary) hypertension: Secondary | ICD-10-CM | POA: Diagnosis not present

## 2023-08-14 MED ORDER — GADOBUTROL 1 MMOL/ML IV SOLN
10.0000 mL | Freq: Once | INTRAVENOUS | Status: AC | PRN
  Administered 2023-08-14: 10 mL via INTRAVENOUS

## 2023-08-14 MED ORDER — GADOBUTROL 1 MMOL/ML IV SOLN: 10.0000 mL | Freq: Once | INTRAVENOUS | Status: DC | PRN

## 2023-08-17 DIAGNOSIS — D259 Leiomyoma of uterus, unspecified: Secondary | ICD-10-CM | POA: Diagnosis not present

## 2023-08-18 ENCOUNTER — Telehealth: Payer: Self-pay | Admitting: Cardiovascular Disease

## 2023-08-18 NOTE — Telephone Encounter (Signed)
 Loyde Rule, MD to Me    08/16/23  9:55 PM Result Note No amyloid normal heart function and valves good  The patient has been notified of the result and verbalized understanding.  All questions (if any) were answered. Antonetta Kitchen, RN 08/18/2023 10:45 AM

## 2023-08-18 NOTE — Telephone Encounter (Signed)
Pt returning call for MRI results  

## 2023-08-20 DIAGNOSIS — F411 Generalized anxiety disorder: Secondary | ICD-10-CM | POA: Diagnosis not present

## 2023-08-20 DIAGNOSIS — F331 Major depressive disorder, recurrent, moderate: Secondary | ICD-10-CM | POA: Diagnosis not present

## 2023-08-23 NOTE — Progress Notes (Unsigned)
  Electrophysiology Office Note:   Date:  08/24/2023  ID:  Katelyn [name], DOB [birth year], MRN [id number]  Primary Cardiologist: Janelle Mediate, MD Primary Heart Failure: None Electrophysiologist: None      History of Present Illness:   Katelyn [name] is a 48 y.o. female with h/o PVCs, hypertension,'s chronic and worsening small fiber neuropathy, dysautonomia seen today for  for Electrophysiology evaluation of palpitations at the request of Janelle Mediate.    # She has been having issues for the last few years with palpitations, and blood pressure issues.  She had breast surgery in 2018 and all of her issues stem from that.  When she woke up from breast surgery, she was in severe pain.  She has been diagnosed with small fiber autonomic neuropathy.  She saw neurology at [facility] who had nothing to offer and said to contact her cardiologist.  She has orthostatic symptoms almost every time she stands.  She notices that her blood pressure drops and her heart rate goes up when she stands up.  She says that her heart rate at home has been up into the 190s.  She has not had syncope.  She does have significant palpitations.  Today, denies symptoms of chest pain, shortness of breath, orthopnea, PND, lower extremity edema, claudication, dizziness, presyncope, syncope, bleeding, or neurologic sequela. The patient is tolerating medications without difficulties.   Review of systems complete and found to be negative unless listed in HPI.   EP Information / Studies Reviewed:    EKG is ordered today. Personal review as below.  EKG Interpretation Date/Time:  Monday August 24 2023 14:14:36 EDT Ventricular Rate:  105 PR Interval:  150 QRS Duration:  72 QT Interval:  330 QTC Calculation: 436 R Axis:   32  Text Interpretation: Sinus tachycardia When compared with ECG of 24-May-2021 08:54, No significant change was found Confirmed by Sonal Dorwart ([id number]) on 08/24/2023 2:15:21 PM     Risk  Assessment/Calculations:             Physical Exam:   VS:  There were no vitals taken for this visit.   Wt Readings from Last 3 Encounters:  07/27/23 245 lb (111.1 kg)  04/16/23 245 lb 3.2 oz (111.2 kg)  01/27/23 245 lb (111.1 kg)     GEN: Well nourished, well developed in no acute distress NECK: No JVD; No carotid bruits CARDIAC: Regular rate and rhythm, no murmurs, rubs, gallops RESPIRATORY:  Clear to auscultation without rales, wheezing or rhonchi  ABDOMEN: Soft, non-tender, non-distended EXTREMITIES:  No edema; No deformity   ASSESSMENT AND PLAN:    1.  Autonomic dysfunction: Has had multiple years worth of the symptoms of orthostasis and tachycardia.  There is likely some component of autonomic dysfunction involved, as she has small vessel neuropathy.  I have advised her of the salt loading up to 12 g/day and 3 L of fluid per day.  Have also given her instructions on an exercise regimen.  She thinks that she Reyah Streeter have trouble with sitting maneuvers, as she has other neuropathies and pain.  2.  Hypertension: Well-controlled   Follow up with EP APP in 3 months  Signed, Dangelo Guzzetta [place] Ding, MD

## 2023-08-24 ENCOUNTER — Ambulatory Visit: Attending: Cardiology | Admitting: Cardiology

## 2023-08-24 ENCOUNTER — Encounter: Payer: Self-pay | Admitting: Cardiology

## 2023-08-24 VITALS — BP 134/84 | HR 105 | Ht 65.0 in | Wt 260.0 lb

## 2023-08-24 DIAGNOSIS — G909 Disorder of the autonomic nervous system, unspecified: Secondary | ICD-10-CM

## 2023-08-24 DIAGNOSIS — I10 Essential (primary) hypertension: Secondary | ICD-10-CM | POA: Diagnosis not present

## 2023-08-24 NOTE — Patient Instructions (Addendum)
 Medication Instructions:  Your physician recommends that you continue on your current medications as directed. Please refer to the Current Medication list given to you today.  *If you need a refill on your cardiac medications before your next appointment, please call your pharmacy*   Lab Work: None ordered If you have labs (blood work) drawn today and your tests are completely normal, you will receive your results only by: MyChart Message (if you have MyChart) OR A paper copy in the mail If you have any lab test that is abnormal or we need to change your treatment, we will call you to review the results.   Testing/Procedures: None ordered   Follow-Up: At [facility], you and your health needs are our priority.  As part of our continuing mission to provide you with exceptional heart care, we have created designated Provider Care Teams.  These Care Teams include your primary Cardiologist (physician) and Advanced Practice Providers (APPs -  Physician Assistants and Nurse Practitioners) who all work together to provide you with the care you need, when you need it.  We recommend signing up for the patient portal called "MyChart".  Sign up information is provided on this After Visit Summary.  MyChart is used to connect with patients for Virtual Visits (Telemedicine).  Patients are able to view lab/test results, encounter notes, upcoming appointments, etc.  Non-urgent messages can be sent to your provider as well.   To learn more about what you can do with MyChart, go to [email or web address].    Your next appointment:   3 month(s)  The format for your next appointment:   In Person  Provider:   You will see one of the following Advanced Practice Providers on your designated Care Team:   Mertha Abrahams, Kennard Pea [address] [place], PA-C Suzann Riddle, NP Creighton Doffing, NP    Thank you for choosing [facility]!!   Reece Cane, RN [phone]  Other  Instructions   [email or web address]

## 2023-09-03 ENCOUNTER — Other Ambulatory Visit: Payer: Self-pay

## 2023-09-03 ENCOUNTER — Ambulatory Visit: Attending: Obstetrics and Gynecology

## 2023-09-03 DIAGNOSIS — R293 Abnormal posture: Secondary | ICD-10-CM | POA: Insufficient documentation

## 2023-09-03 DIAGNOSIS — R279 Unspecified lack of coordination: Secondary | ICD-10-CM | POA: Insufficient documentation

## 2023-09-03 DIAGNOSIS — M5459 Other low back pain: Secondary | ICD-10-CM | POA: Insufficient documentation

## 2023-09-03 DIAGNOSIS — G8929 Other chronic pain: Secondary | ICD-10-CM | POA: Insufficient documentation

## 2023-09-03 DIAGNOSIS — M62838 Other muscle spasm: Secondary | ICD-10-CM | POA: Diagnosis not present

## 2023-09-03 DIAGNOSIS — M6281 Muscle weakness (generalized): Secondary | ICD-10-CM | POA: Insufficient documentation

## 2023-09-03 DIAGNOSIS — R102 Pelvic and perineal pain: Secondary | ICD-10-CM | POA: Insufficient documentation

## 2023-09-03 NOTE — Therapy (Signed)
 OUTPATIENT PHYSICAL THERAPY EVALUATION NOTE   Patient Name: Katelyn [name] MRN: [id number] DOB:[birth year], 48 y.o., female Today's Date: 09/03/2023  PCP: Bennet Brasil, MD  REFERRING PROVIDER: Kiki Pelton, MD   END OF SESSION:   PT End of Session - 09/03/23 0755     Visit Number 1    Date for PT Re-Evaluation 11/26/23    Authorization Type HTA    PT Start Time 0800    PT Stop Time 0840    PT Time Calculation (min) 40 min    Activity Tolerance Patient tolerated treatment well    Behavior During Therapy [facility] for tasks assessed/performed                     Past Medical History:  Diagnosis Date   Anemia    Anorexia    Anxiety    on meds   Asthma    controlled with meds   Benign juvenile melanoma    Chronic headaches    Colon polyps    found on colonoscopy 04/26/2012   Complication of anesthesia    itching after epidural for c section   Constipation    Depression    on meds   Depression    several suicide attempts, hospitaluzed in 2012 for this; hx pf ECT treatments ; pt sees Dr. Arfeen pyschiatrist  and doing well on medication   Dysrhythmia    hx of PVC's (pt hasn't seen cardiologist since 2021- no follow up needed)   GERD (gastroesophageal reflux disease)    Headache    Heart murmur    as a child - no one mentions hearing murmur anymore, sometimes it is heard. Echo has been performed 02/03/18   Heartburn    no meds   History of pneumonia    x 3  years ago - no recent problems   Hx of blood clots 2019   bilateral LE   Hypertension    Left renal atrophy 12/19/2021   LEFT   Neuromuscular disorder (HCC)    small fiber neuropathy   Obesity    Pneumonia    Polycystic ovary    takes metformin  to treat   Skin lesion    Excisional biopsy of moles - none cancerous   Sleep apnea    yrs ago - diagnosed mild sleep apnea - did not have to use cpap    Past Surgical History:  Procedure Laterality Date   BREAST CAPSULECTOMY Bilateral  12/13/2020   1 week later pt had hematoma surgery on left breast   BREAST ENHANCEMENT SURGERY Bilateral 02/11/2017   BREATH TEK H PYLORI N/A 07/22/2013   Procedure: BREATH TEK H PYLORI;  Surgeon: Quitman Bucy, MD;  Location: [facility];  Service: General;  Laterality: N/A;   CESAREAN SECTION  2004, 2007   x 2   COLONOSCOPY  2013   COLONOSCOPY  2021   MS-F/V-sutab (good)-[facility]   CYSTOSCOPY WITH URETHRAL DILATATION  age 3    GASTRIC ROUX-EN-Y N/A 10/31/2013   Procedure: LAPAROSCOPIC ROUX-EN-Y GASTRIC BYPASS WITH UPPER ENDOSCOPY;  Surgeon: Quitman Bucy, MD;  Location: [facility];  Service: General;  Laterality: N/A;   GLUTEUS MINIMUS REPAIR Left 05/28/2021   Procedure: Left GLUTEUS Medius REPAIR;  Surgeon: Wilhelmenia Harada, MD;  Location: [facility] OR;  Service: Orthopedics;  Laterality: Left;   ingrown toenail Right    Ingrown nail on great right toe   kidney stent  08/28/2014   mole excision     "benign  juvenile melanoma" removed from left leg - inner thigh   POLYPECTOMY     2013   SKIN LESION EXCISION     back   TONSILLECTOMY  age 65   TUBAL LIGATION  2018   Patient Active Problem List   Diagnosis Date Noted   Essential hypertension 11/04/2022   Panic disorder (episodic paroxysmal anxiety) 07/10/2022   Major depressive disorder, recurrent, moderate (HCC) 07/10/2022   Left renal atrophy 12/19/2021   Stage 2 chronic kidney disease 11/20/2021   GERD (gastroesophageal reflux disease) 09/27/2021   Chronic pain syndrome 09/27/2021   Asthma 07/18/2020   Chronic myofascial pain 03/18/2018   Small fiber neuropathy 03/18/2018   Idiopathic small fiber sensory neuropathy 02/23/2018   Varicose veins of both lower extremities 01/13/2018   Cervical spondylosis without myelopathy 08/28/2017   Migraine with aura and without status migrainosus, not intractable 07/22/2017   Lumbar degenerative disc disease 07/20/2017   Spondylosis of lumbar spine 07/20/2017   Coccydynia 03/12/2017    Bariatric surgery status 05/02/2016   Tachy-brady syndrome (HCC) 12/19/2015   Iron  deficiency anemia 06/10/2015   Sleep disorder 06/01/2013   Generalized anxiety disorder 05/30/2013   Depression, major, recurrent, severe with psychosis (HCC) 06/17/2012   Eating disorder 12/18/2011   Constipation 01/20/2011   Polycystic ovarian syndrome 09/01/2008    REFERRING DIAG: R10.2,G89.29 (ICD-10-CM) - Chronic pelvic pain in female  THERAPY DIAG:  Muscle weakness (generalized)  Other muscle spasm  Unspecified lack of coordination  Abnormal posture  Other low back pain  Pelvic pain  Rationale for Evaluation and Treatment Rehabilitation  PERTINENT HISTORY: Breast augmentation surgeries with follow-up reduction with complication, c/s X 2, gastric bypass, bil tubal ligation, long history of chronic low back/pelvic pain, pudendal neuralgia, coccydynia, Lt glute med repair, Lt ovarian cyst, pelvic tumor, small fiber neuropathy, Lt hip OA   PRECAUTIONS: extreme tachycardia, no standing exercise - monitor heart rate, pelvic tumor without biopsy, possible cancer? No dx at this point - just watching blood levels   SUBJECTIVE:                                                                                                                                                                                      SUBJECTIVE STATEMENT:   Pt returns to pelvic floor PT for continued chronic pelvic pain. MD has suggested 3 options: vaginal valium  suppository, botox, pudendal nerve block. She has had recent ultrasound that demonstrated that showed growing pelvic tumor. She is going for follow up ultrasound next week. She also has a Lt ovarian cyst. She has also had a full cardiac work out and is on no exercise unless is is reclined (  POTTs protocol) due to extreme tachycardia when she is standing. She feels like pain is much worse because she has not been able to exercise. Her husband has had to stop working in order  to take care of her at home. Her biggest goal is to be able to sit right now. She is having chronic yeast infections as well.   Problem list: -pudendal neuroalgia/intermittent labial pain -Bil hip and low back pain -being unable to sit -deconditioning -urinary urgency/leaking -constipation  -dyspareunia    PAIN:  PAIN:  Are you having pain? Yes NPRS scale: 8/10 Pain location: vagina/labia, low back, hips, lower abdomen Pain orientation: Bilateral  PAIN TYPE: aching, burning, sharp, throbbing, tight, and tingling Pain description: constant  Aggravating factors: not exercising Relieving factors: alternating sides, lying on stomach, small amounts of standing     PRECAUTIONS: None   WEIGHT BEARING RESTRICTIONS: No   FALLS:  Has patient fallen in last 6 months? No   LIVING ENVIRONMENT: Lives with: lives with their family Lives in: House/apartment   OCCUPATION: disabled   PLOF: Independent   PATIENT GOALS: would like to be able to sit; decrease bil hip/low back pain    BOWEL MOVEMENT: Pain with bowel movement: No Type of bowel movement:Frequency 1x/day to every other day and Strain Yes Fully empty rectum: No Leakage: No Pads: No Fiber supplement: No no medication to help stimulate bowel movements   URINATION: Pain with urination: No Fully empty bladder: No Stream:  trouble starting the stream - worse at night Urgency: yes Frequency: every 30 minutes to an hour Leakage:  Laugh, cough, sneeze Pads: No due to irritation    INTERCOURSE: Pain with intercourse: Initial Penetration and During Penetration Ability to have vaginal penetration:  Yes: pain  Climax: yes Marinoff Scale: 2/3   PREGNANCY: Vaginal deliveries 0 Tearing No C-section deliveries 2 Currently pregnant No       OBJECTIVE:   09/03/23:  PATIENT SURVEYS:   PFIQ-7: 100  COGNITION: Overall cognitive status: Within functional limits for tasks assessed     SENSATION: Light touch:  Appears intact  FUNCTIONAL TESTS:  Single leg stance:  Rt: pelvic drop with UE support  Lt: pelvic drop with UE support  5xSTS: 36 seconds  GAIT: Assistive device utilized: None Comments: WNL  POSTURE: No Significant postural limitations   LUMBARAROM/PROM:  A/PROM A/PROM  Eval (% available)  Flexion 25  Extension 20  Right lateral flexion 50  Left lateral flexion 50  Right rotation 50  Left rotation 50   (Blank rows = not tested)  PALPATION:   General: lumbar paraspinal and oblique muscle restriction  Pelvic Alignment: Lt iliac crest elevation, significant Rt posterior rotation   Abdominal: lower abdominal tenderness                 External Perineal Exam: redness and irritation equal bil                             Internal Pelvic Floor: sharp, tender, pain throughout superficial muscles; achy and restricted in bil levator ani with more pain on the Rt compared to the Lt; dryness; paradoxical pelvic floor bearing down  Patient confirms identification and approves PT to assess internal pelvic floor and treatment Yes  PELVIC MMT:   MMT eval  Vaginal 1/5, 5 second hold, 2 repeat contractions - very difficult time with coordination  (Blank rows = not tested)  TONE: High with pain  PROLAPSE: None noted today; she did have paradoxical bearing down contraction so may not have been able to detect     TODAY'S TREATMENT  EVAL Therapeutic activities: Pt education on frequency and duration of care to work on seeing progress with pudendal neuralgia Talking with MD about possible benefit of estrogen cream due to dryness  Pt education on findings, amount of muscle tension, paradoxical pelvic floor muscle contraction    PATIENT EDUCATION:  Education details: see above self-care Person educated: Patient Education method: Explanation, Demonstration, Tactile cues, Verbal cues, and Handouts Education comprehension: verbalized understanding   HOME EXERCISE  PROGRAM: NA   ASSESSMENT:   CLINICAL IMPRESSION: Pt is a 48 year old female returning to pelvic floor PT in order to reassess pelvic pain, pudendal neuralgia, inability to sit, low back pain, bowel/bladder dysfunction, and dyspareunia. Due to diagnosis of cardiac dysfunction, pelvic tumor, and dysautonomia, her main goals currently are to be able to sit for prolonged periods of time without notable pain and reutrn to intercourse with husband without severe pain (who is out of work to help take care of her). Exam findings include being unable to sit for any length of time, decrease in lumbar A/ROM, pelvic drop in single leg stance with UE support, notable pelvic rotation/shift in standing, vulvar irritation and redness bil, dryness and pain throughout internal superficial and deep pelvic floor muscles (most notable surrounding pudendal nerve bil), high tone pelvic floor bil, pelvic floor muscle weakness and decreased endurance, significant difficulty with coordination of pelvic floor muscle contraction and relaxation. Signs and symptoms of pudendal neuralgia and pain with sitting are most consistent with pelvic floor muscle spasm and asymmetries in pelvis; this is also likely the cause of bowel and bladder dysfunction in addition to paradoxical muscle lengthening. Overall deconditioning and inability to be active are likely contributing to condition. She will continue to benefit from skilled PT intervention in order to decrease pain, improve functional ability specifically sitting tolerance, increase ease of starting stream/emptying bladder, decrease dyspareunia, and improve QOL.    OBJECTIVE IMPAIRMENTS: decreased activity tolerance, decreased coordination, decreased endurance, decreased mobility, decreased ROM, decreased strength, hypomobility, increased fascial restrictions, increased muscle spasms, impaired flexibility, impaired tone, postural dysfunction, and pain.    ACTIVITY LIMITATIONS: bending,  sitting, standing, stairs, transfers, bed mobility, and locomotion level, vaginal penetration   PARTICIPATION LIMITATIONS: cleaning, laundry, interpersonal relationship, driving, and community activity   PERSONAL FACTORS: 3+ comorbidities: Breast augmentation surgeries with follow-up reduction with complication, c/s X 2, gastric bypass, bil tubal ligation, long history of chronic low back/pelvic pain, pudendal neuralgia, coccydynia, Lt glute med repair, Lt ovarian cyst, pelvic tumor, small fiber neuropathy, Lt hip OA  are also affecting patient's functional outcome.    REHAB POTENTIAL: Good   CLINICAL DECISION MAKING: Stable/uncomplicated   EVALUATION COMPLEXITY: Low     GOALS: Goals reviewed with patient? Yes   SHORT TERM GOALS: Target date: 10/01/2023   Pt will be independent with HEP.    Baseline: Goal status: INITIAL   2.  Pt will be independent with diaphragmatic breathing and down training activities in order to improve pelvic floor relaxation and abdominal wall mobility.   Baseline:  Goal status: INITIAL   3.  Pt will report improve stream initiation and complete voiding with urination in order to decrease urinary leaking that causes skin irritation. Baseline:  Goal status: INITIAL              4.  Pt will  increase all impaired lumbar A/ROM by 25% without pain in order to decrease pelvic pain and improve pelvic floor muscle length tension relationship.   Baseline:  Goal status: INITIAL  5. Pt will stop straining with bowel movements to decrease strain on pelvic floor muscles in order to reduce pelvic pain, allowing her to sit for longer periods of time.   Baseline:  Goal status: INITIAL   LONG TERM GOALS: Target date: 11/26/23   Pt will be independent with advanced HEP.    Baseline:  Goal status:INITIAL   2.  Pt will be able to have intercourse with no greater than 5/10 pain in order to maintain intimate relationship with partner.  Baseline:  Goal status:  INITIAL   3.  Pt will be able to ascend/descend 12 steps at regular intervals throughout the day safely and with minimal difficulty in order to perform household chores.  Baseline:  Goal status: INITIAL   4.  Pt will improve 5xSTS to less than 20 seconds and improved PFIQ by 25% in order to demonstrate improved QOL and functional ability.  Baseline:  Goal status: INITIAL   5.  Patient will report no greater than 5/10 pain with sitting in order to more easily attend appointments, drive, and spend time with family.  Baseline:  Goal status: INITIAL   6.  Pt will be able to sit for >30 minutes in order to allow heart rate to remain lower and perform seated exercise.    Baseline:  Goal status: INITIAL  7. Pt will be able to stand on one leg in order to put on pants on without pain or difficulty.    Baseline: unable/causing severe pain the rest of the day Goal status: INITIAL   PLAN:   PT FREQUENCY: 1x/week   PT DURATION: 12 weeks   PLANNED INTERVENTIONS: Therapeutic exercises, Therapeutic activity, Neuromuscular re-education, Balance training, Gait training, Patient/Family education, Self Care, Joint mobilization, Aquatic Therapy, Dry Needling, Biofeedback, and Manual therapy   PLAN FOR NEXT SESSION: Internal manual techniques to help improve pelvic floor muscle spasm and pain. Work on pelvic alignment.    Verlena Glenn, PT, DPT05/08/253:25 PM

## 2023-09-08 DIAGNOSIS — D259 Leiomyoma of uterus, unspecified: Secondary | ICD-10-CM | POA: Diagnosis not present

## 2023-09-08 DIAGNOSIS — D252 Subserosal leiomyoma of uterus: Secondary | ICD-10-CM | POA: Diagnosis not present

## 2023-09-09 DIAGNOSIS — F411 Generalized anxiety disorder: Secondary | ICD-10-CM | POA: Diagnosis not present

## 2023-09-09 DIAGNOSIS — F331 Major depressive disorder, recurrent, moderate: Secondary | ICD-10-CM | POA: Diagnosis not present

## 2023-09-14 ENCOUNTER — Inpatient Hospital Stay: Attending: Physician Assistant

## 2023-09-14 DIAGNOSIS — D508 Other iron deficiency anemias: Secondary | ICD-10-CM | POA: Diagnosis not present

## 2023-09-14 DIAGNOSIS — D72829 Elevated white blood cell count, unspecified: Secondary | ICD-10-CM | POA: Insufficient documentation

## 2023-09-14 DIAGNOSIS — D75839 Thrombocytosis, unspecified: Secondary | ICD-10-CM | POA: Insufficient documentation

## 2023-09-14 LAB — COMPREHENSIVE METABOLIC PANEL WITH GFR
ALT: 26 U/L (ref 0–44)
AST: 24 U/L (ref 15–41)
Albumin: 4.2 g/dL (ref 3.5–5.0)
Alkaline Phosphatase: 101 U/L (ref 38–126)
Anion gap: 8 (ref 5–15)
BUN: 12 mg/dL (ref 6–20)
CO2: 27 mmol/L (ref 22–32)
Calcium: 9.2 mg/dL (ref 8.9–10.3)
Chloride: 101 mmol/L (ref 98–111)
Creatinine, Ser: 0.86 mg/dL (ref 0.44–1.00)
GFR, Estimated: >60 mL/min (ref >60)
Glucose, Bld: 108 mg/dL — ABNORMAL HIGH (ref 70–99)
Potassium: 4.1 mmol/L (ref 3.5–5.1)
Sodium: 136 mmol/L (ref 135–145)
Total Bilirubin: 0.6 mg/dL (ref 0.0–1.2)
Total Protein: 7.8 g/dL (ref 6.5–8.1)

## 2023-09-14 LAB — CBC WITH DIFFERENTIAL/PLATELET
Abs Immature Granulocytes: 0.12 10*3/uL — ABNORMAL HIGH (ref 0.00–0.07)
Basophils Absolute: 0.1 10*3/uL (ref 0.0–0.1)
Basophils Relative: 1 %
Eosinophils Absolute: 0.2 10*3/uL (ref 0.0–0.5)
Eosinophils Relative: 1 %
HCT: 46 % (ref 36.0–46.0)
Hemoglobin: 15 g/dL (ref 12.0–15.0)
Immature Granulocytes: 1 %
Lymphocytes Relative: 21 %
Lymphs Abs: 2.6 10*3/uL (ref 0.7–4.0)
MCH: 32.1 pg (ref 26.0–34.0)
MCHC: 32.6 g/dL (ref 30.0–36.0)
MCV: 98.5 fL (ref 80.0–100.0)
Monocytes Absolute: 0.8 10*3/uL (ref 0.1–1.0)
Monocytes Relative: 6 %
Neutro Abs: 8.8 10*3/uL — ABNORMAL HIGH (ref 1.7–7.7)
Neutrophils Relative %: 70 %
Platelets: 455 10*3/uL — ABNORMAL HIGH (ref 150–400)
RBC: 4.67 MIL/uL (ref 3.87–5.11)
RDW: 12.8 % (ref 11.5–15.5)
WBC: 12.5 10*3/uL — ABNORMAL HIGH (ref 4.0–10.5)
nRBC: 0 % (ref 0.0–0.2)

## 2023-09-24 ENCOUNTER — Ambulatory Visit: Payer: Self-pay | Admitting: Diagnostic Neuroimaging

## 2023-09-24 DIAGNOSIS — F411 Generalized anxiety disorder: Secondary | ICD-10-CM | POA: Diagnosis not present

## 2023-09-24 DIAGNOSIS — F331 Major depressive disorder, recurrent, moderate: Secondary | ICD-10-CM | POA: Diagnosis not present

## 2023-09-25 ENCOUNTER — Encounter: Payer: Self-pay | Admitting: Physical Therapy

## 2023-09-25 ENCOUNTER — Ambulatory Visit: Payer: Self-pay | Admitting: Physical Therapy

## 2023-09-25 DIAGNOSIS — R102 Pelvic and perineal pain: Secondary | ICD-10-CM

## 2023-09-25 DIAGNOSIS — M5459 Other low back pain: Secondary | ICD-10-CM

## 2023-09-25 DIAGNOSIS — R279 Unspecified lack of coordination: Secondary | ICD-10-CM

## 2023-09-25 DIAGNOSIS — R293 Abnormal posture: Secondary | ICD-10-CM

## 2023-09-25 DIAGNOSIS — M6281 Muscle weakness (generalized): Secondary | ICD-10-CM

## 2023-09-25 DIAGNOSIS — M62838 Other muscle spasm: Secondary | ICD-10-CM

## 2023-09-25 NOTE — Therapy (Signed)
 OUTPATIENT PHYSICAL THERAPY TREATMENT NOTE   Patient Name: Katelyn [name] MRN: [id number] DOB:11/26/1975, 48 y.o., female Today's Date: 09/25/2023  PCP: Bennet Brasil, MD  REFERRING PROVIDER: Kiki Pelton, MD   END OF SESSION:   PT End of Session - 09/25/23 0849     Visit Number 2    Date for PT Re-Evaluation 11/26/23    Authorization Type HTA    PT Start Time 0845    PT Stop Time 0925    PT Time Calculation (min) 40 min    Activity Tolerance Patient tolerated treatment well    Behavior During Therapy [facility] for tasks assessed/performed                     Past Medical History:  Diagnosis Date   Anemia    Anorexia    Anxiety    on meds   Asthma    controlled with meds   Benign juvenile melanoma    Chronic headaches    Colon polyps    found on colonoscopy 04/26/2012   Complication of anesthesia    itching after epidural for c section   Constipation    Depression    on meds   Depression    several suicide attempts, hospitaluzed in 2012 for this; hx pf ECT treatments ; pt sees Dr. Carlos [place] pyschiatrist  and doing well on medication   Dysrhythmia    hx of PVC's (pt hasn't seen cardiologist since 2021- no follow up needed)   GERD (gastroesophageal reflux disease)    Headache    Heart murmur    as a child - no one mentions hearing murmur anymore, sometimes it is heard. Echo has been performed 02/03/18   Heartburn    no meds   History of pneumonia    x 3  years ago - no recent problems   Hx of blood clots 2019   bilateral LE   Hypertension    Left renal atrophy 12/19/2021   LEFT   Neuromuscular disorder (HCC)    small fiber neuropathy   Obesity    Pneumonia    Polycystic ovary    takes metformin  to treat   Skin lesion    Excisional biopsy of moles - none cancerous   Sleep apnea    yrs ago - diagnosed mild sleep apnea - did not have to use cpap    Past Surgical History:  Procedure Laterality Date   BREAST CAPSULECTOMY Bilateral  12/13/2020   1 week later pt had hematoma surgery on left breast   BREAST ENHANCEMENT SURGERY Bilateral 02/11/2017   BREATH TEK H PYLORI N/A 07/22/2013   Procedure: BREATH TEK H PYLORI;  Surgeon: Quitman Bucy, MD;  Location: [facility];  Service: General;  Laterality: N/A;   CESAREAN SECTION  2004, 2007   x 2   COLONOSCOPY  2013   COLONOSCOPY  2021   MS-F/V-sutab (good)-[facility]   CYSTOSCOPY WITH URETHRAL DILATATION  age 31    GASTRIC ROUX-EN-Y N/A 10/31/2013   Procedure: LAPAROSCOPIC ROUX-EN-Y GASTRIC BYPASS WITH UPPER ENDOSCOPY;  Surgeon: Quitman Bucy, MD;  Location: [facility];  Service: General;  Laterality: N/A;   GLUTEUS MINIMUS REPAIR Left 05/28/2021   Procedure: Left GLUTEUS Medius REPAIR;  Surgeon: Wilhelmenia Harada, MD;  Location: [facility] OR;  Service: Orthopedics;  Laterality: Left;   ingrown toenail Right    Ingrown nail on great right toe   kidney stent  08/28/2014   mole excision     "benign  juvenile melanoma" removed from left leg - inner thigh   POLYPECTOMY     2013   SKIN LESION EXCISION     back   TONSILLECTOMY  age 12   TUBAL LIGATION  2018   Patient Active Problem List   Diagnosis Date Noted   Essential hypertension 11/04/2022   Panic disorder (episodic paroxysmal anxiety) 07/10/2022   Major depressive disorder, recurrent, moderate (HCC) 07/10/2022   Left renal atrophy 12/19/2021   Stage 2 chronic kidney disease 11/20/2021   GERD (gastroesophageal reflux disease) 09/27/2021   Chronic pain syndrome 09/27/2021   Asthma 07/18/2020   Chronic myofascial pain 03/18/2018   Small fiber neuropathy 03/18/2018   Idiopathic small fiber sensory neuropathy 02/23/2018   Varicose veins of both lower extremities 01/13/2018   Cervical spondylosis without myelopathy 08/28/2017   Migraine with aura and without status migrainosus, not intractable 07/22/2017   Lumbar degenerative disc disease 07/20/2017   Spondylosis of lumbar spine 07/20/2017   Coccydynia 03/12/2017    Bariatric surgery status 05/02/2016   Tachy-brady syndrome (HCC) 12/19/2015   Iron  deficiency anemia 06/10/2015   Sleep disorder 06/01/2013   Generalized anxiety disorder 05/30/2013   Depression, major, recurrent, severe with psychosis (HCC) 06/17/2012   Eating disorder 12/18/2011   Constipation 01/20/2011   Polycystic ovarian syndrome 09/01/2008    REFERRING DIAG: R10.2,G89.29 (ICD-10-CM) - Chronic pelvic pain in female  THERAPY DIAG:  Muscle weakness (generalized)  Other muscle spasm  Unspecified lack of coordination  Abnormal posture  Other low back pain  Pelvic pain  Rationale for Evaluation and Treatment Rehabilitation  PERTINENT HISTORY: Breast augmentation surgeries with follow-up reduction with complication, c/s X 2, gastric bypass, bil tubal ligation, long history of chronic low back/pelvic pain, pudendal neuralgia, coccydynia, Lt glute med repair, Lt ovarian cyst, pelvic tumor, small fiber neuropathy, Lt hip OA   PRECAUTIONS: extreme tachycardia, no standing exercise - monitor heart rate, pelvic tumor without biopsy, possible cancer? No dx at this point - just watching blood levels   SUBJECTIVE:                                                                                                                                                                                      SUBJECTIVE STATEMENT:   09/25/23: I am 3 days late with my period. I am still irritated vaginally and will see the gynecologist.  Pt returns to pelvic floor PT for continued chronic pelvic pain. MD has suggested 3 options: vaginal valium  suppository, botox, pudendal nerve block. She has had recent ultrasound that demonstrated that showed growing pelvic tumor. She is going for follow up ultrasound next week. She also has a Animator  ovarian cyst. She has also had a full cardiac work out and is on no exercise unless is is reclined (POTTs protocol) due to extreme tachycardia when she is standing. She feels like  pain is much worse because she has not been able to exercise. Her husband has had to stop working in order to take care of her at home. Her biggest goal is to be able to sit right now. She is having chronic yeast infections as well.   Problem list: -pudendal neuroalgia/intermittent labial pain -Bil hip and low back pain -being unable to sit -deconditioning -urinary urgency/leaking -constipation  -dyspareunia    PAIN:  PAIN:  Are you having pain? Yes NPRS scale: 8/10 09/25/23: 8/10 Pain location: vagina/labia, low back, hips, lower abdomen Pain orientation: Bilateral  PAIN TYPE: aching, burning, sharp, throbbing, tight, and tingling Pain description: constant  Aggravating factors: not exercising Relieving factors: alternating sides, lying on stomach, small amounts of standing     PRECAUTIONS: None   WEIGHT BEARING RESTRICTIONS: No   FALLS:  Has patient fallen in last 6 months? No   LIVING ENVIRONMENT: Lives with: lives with their family Lives in: House/apartment   OCCUPATION: disabled   PLOF: Independent   PATIENT GOALS: would like to be able to sit; decrease bil hip/low back pain    BOWEL MOVEMENT: Pain with bowel movement: No Type of bowel movement:Frequency 1x/day to every other day and Strain Yes Fully empty rectum: No Leakage: No Pads: No Fiber supplement: No no medication to help stimulate bowel movements   URINATION: Pain with urination: No Fully empty bladder: No Stream:  trouble starting the stream - worse at night Urgency: yes Frequency: every 30 minutes to an hour Leakage:  Laugh, cough, sneeze Pads: No due to irritation    INTERCOURSE: Pain with intercourse: Initial Penetration and During Penetration Ability to have vaginal penetration:  Yes: pain  Climax: yes Marinoff Scale: 2/3   PREGNANCY: Vaginal deliveries 0 Tearing No C-section deliveries 2 Currently pregnant No       OBJECTIVE:   09/03/23:  PATIENT SURVEYS:   PFIQ-7:  100  COGNITION: Overall cognitive status: Within functional limits for tasks assessed     SENSATION: Light touch: Appears intact  FUNCTIONAL TESTS:  Single leg stance:  Rt: pelvic drop with UE support  Lt: pelvic drop with UE support  5xSTS: 36 seconds  GAIT: Assistive device utilized: None Comments: WNL  POSTURE: No Significant postural limitations   LUMBARAROM/PROM:  A/PROM A/PROM  Eval (% available)  Flexion 25  Extension 20  Right lateral flexion 50  Left lateral flexion 50  Right rotation 50  Left rotation 50   (Blank rows = not tested)  PALPATION:   General: lumbar paraspinal and oblique muscle restriction  Pelvic Alignment: Lt iliac crest elevation, significant Rt posterior rotation   Abdominal: lower abdominal tenderness                 External Perineal Exam: redness and irritation equal bil                             Internal Pelvic Floor: sharp, tender, pain throughout superficial muscles; achy and restricted in bil levator ani with more pain on the Rt compared to the Lt; dryness; paradoxical pelvic floor bearing down  Patient confirms identification and approves PT to assess internal pelvic floor and treatment Yes  PELVIC MMT:   MMT eval  Vaginal 1/5, 5 second  hold, 2 repeat contractions - very difficult time with coordination  (Blank rows = not tested)        TONE: High with pain  PROLAPSE: None noted today; she did have paradoxical bearing down contraction so may not have been able to detect     TODAY'S TREATMENT  09/25/23 Manual: Myofascial release: Prone one hand on sacrum and other in area of uterus and going through the different restrictions Fascial release of the obturator internist in prone bilaterally with moving the hips into internal and external rotation and holding 90 seconds for trigger points Fascial release with one hand on sacrum and other on T6 area and give slight distraction going through the restrictions slowly in  prone    EVAL Therapeutic activities: Pt education on frequency and duration of care to work on seeing progress with pudendal neuralgia Talking with MD about possible benefit of estrogen cream due to dryness  Pt education on findings, amount of muscle tension, paradoxical pelvic floor muscle contraction    PATIENT EDUCATION:  Education details: see above self-care Person educated: Patient Education method: Explanation, Demonstration, Tactile cues, Verbal cues, and Handouts Education comprehension: verbalized understanding   HOME EXERCISE PROGRAM: NA   ASSESSMENT:   CLINICAL IMPRESSION: Pt is a 48 year old female returning to pelvic floor PT in order to reassess pelvic pain, pudendal neuralgia, inability to sit, low back pain, bowel/bladder dysfunction, and dyspareunia. Due to diagnosis of cardiac dysfunction, pelvic tumor, and dysautonomia, her main goals currently are to be able to sit for prolonged periods of time without notable pain and reutrn to intercourse with husband without severe pain (who is out of work to help take care of her). Today focused on fascial release around the pelvic area to reduce the pain. Her pain level went from 8/10 to 6/10 at the end of treatment. She has fascial restrictions throughout her body contributing to her pain.  She will continue to benefit from skilled PT intervention in order to decrease pain, improve functional ability specifically sitting tolerance, increase ease of starting stream/emptying bladder, decrease dyspareunia, and improve QOL.    OBJECTIVE IMPAIRMENTS: decreased activity tolerance, decreased coordination, decreased endurance, decreased mobility, decreased ROM, decreased strength, hypomobility, increased fascial restrictions, increased muscle spasms, impaired flexibility, impaired tone, postural dysfunction, and pain.    ACTIVITY LIMITATIONS: bending, sitting, standing, stairs, transfers, bed mobility, and locomotion level, vaginal  penetration   PARTICIPATION LIMITATIONS: cleaning, laundry, interpersonal relationship, driving, and community activity   PERSONAL FACTORS: 3+ comorbidities: Breast augmentation surgeries with follow-up reduction with complication, c/s X 2, gastric bypass, bil tubal ligation, long history of chronic low back/pelvic pain, pudendal neuralgia, coccydynia, Lt glute med repair, Lt ovarian cyst, pelvic tumor, small fiber neuropathy, Lt hip OA  are also affecting patient's functional outcome.    REHAB POTENTIAL: Good   CLINICAL DECISION MAKING: Stable/uncomplicated   EVALUATION COMPLEXITY: Low     GOALS: Goals reviewed with patient? Yes   SHORT TERM GOALS: Target date: 10/01/2023   Pt will be independent with HEP.    Baseline: Goal status: INITIAL   2.  Pt will be independent with diaphragmatic breathing and down training activities in order to improve pelvic floor relaxation and abdominal wall mobility.   Baseline:  Goal status: INITIAL   3.  Pt will report improve stream initiation and complete voiding with urination in order to decrease urinary leaking that causes skin irritation. Baseline:  Goal status: INITIAL  4.  Pt will increase all impaired lumbar A/ROM by 25% without pain in order to decrease pelvic pain and improve pelvic floor muscle length tension relationship.   Baseline:  Goal status: INITIAL  5. Pt will stop straining with bowel movements to decrease strain on pelvic floor muscles in order to reduce pelvic pain, allowing her to sit for longer periods of time.   Baseline:  Goal status: INITIAL   LONG TERM GOALS: Target date: 11/26/23   Pt will be independent with advanced HEP.    Baseline:  Goal status:INITIAL   2.  Pt will be able to have intercourse with no greater than 5/10 pain in order to maintain intimate relationship with partner.  Baseline:  Goal status: INITIAL   3.  Pt will be able to ascend/descend 12 steps at regular intervals  throughout the day safely and with minimal difficulty in order to perform household chores.  Baseline:  Goal status: INITIAL   4.  Pt will improve 5xSTS to less than 20 seconds and improved PFIQ by 25% in order to demonstrate improved QOL and functional ability.  Baseline:  Goal status: INITIAL   5.  Patient will report no greater than 5/10 pain with sitting in order to more easily attend appointments, drive, and spend time with family.  Baseline:  Goal status: INITIAL   6.  Pt will be able to sit for >30 minutes in order to allow heart rate to remain lower and perform seated exercise.    Baseline:  Goal status: INITIAL  7. Pt will be able to stand on one leg in order to put on pants on without pain or difficulty.    Baseline: unable/causing severe pain the rest of the day Goal status: INITIAL   PLAN:   PT FREQUENCY: 1x/week   PT DURATION: 12 weeks   PLANNED INTERVENTIONS: Therapeutic exercises, Therapeutic activity, Neuromuscular re-education, Balance training, Gait training, Patient/Family education, Self Care, Joint mobilization, Aquatic Therapy, Dry Needling, Biofeedback, and Manual therapy   PLAN FOR NEXT SESSION: Internal manual techniques to help improve pelvic floor muscle spasm and pain. Work on pelvic alignment. Seems to be a candidate for cranio sacral work, she responds to fascial work, worked around the uterus to reduce restrictions    Marsha Skeen, PT 09/25/23 9:28 AM

## 2023-09-28 ENCOUNTER — Ambulatory Visit: Payer: Self-pay | Attending: Obstetrics and Gynecology | Admitting: Physical Therapy

## 2023-09-28 ENCOUNTER — Encounter: Payer: Self-pay | Admitting: Physical Therapy

## 2023-09-28 DIAGNOSIS — R279 Unspecified lack of coordination: Secondary | ICD-10-CM | POA: Insufficient documentation

## 2023-09-28 DIAGNOSIS — R293 Abnormal posture: Secondary | ICD-10-CM | POA: Diagnosis not present

## 2023-09-28 DIAGNOSIS — M6281 Muscle weakness (generalized): Secondary | ICD-10-CM | POA: Insufficient documentation

## 2023-09-28 DIAGNOSIS — M5459 Other low back pain: Secondary | ICD-10-CM | POA: Insufficient documentation

## 2023-09-28 DIAGNOSIS — R102 Pelvic and perineal pain: Secondary | ICD-10-CM | POA: Diagnosis not present

## 2023-09-28 DIAGNOSIS — M62838 Other muscle spasm: Secondary | ICD-10-CM | POA: Diagnosis not present

## 2023-09-28 NOTE — Therapy (Signed)
 OUTPATIENT PHYSICAL THERAPY TREATMENT NOTE   Patient Name: Katelyn [name] MRN: [id number] DOB:05/20/1975, 48 y.o., female Today's Date: 09/28/2023  PCP: Bennet Brasil, MD  REFERRING PROVIDER: Kiki Pelton, MD   END OF SESSION:   PT End of Session - 09/28/23 1020     Visit Number 3    Date for PT Re-Evaluation 11/26/23    Authorization Type HTA    PT Start Time 1015    PT Stop Time 1055    PT Time Calculation (min) 40 min    Activity Tolerance Patient tolerated treatment well    Behavior During Therapy [facility] for tasks assessed/performed                     Past Medical History:  Diagnosis Date   Anemia    Anorexia    Anxiety    on meds   Asthma    controlled with meds   Benign juvenile melanoma    Chronic headaches    Colon polyps    found on colonoscopy 04/26/2012   Complication of anesthesia    itching after epidural for c section   Constipation    Depression    on meds   Depression    several suicide attempts, hospitaluzed in 2012 for this; hx pf ECT treatments ; pt sees Dr. Carlos [place]field pyschiatrist  and doing well on medication   Dysrhythmia    hx of PVC's (pt hasn't seen cardiologist since 2021- no follow up needed)   GERD (gastroesophageal reflux disease)    Headache    Heart murmur    as a child - no one mentions hearing murmur anymore, sometimes it is heard. Echo has been performed 02/03/18   Heartburn    no meds   History of pneumonia    x 3  years ago - no recent problems   Hx of blood clots 2019   bilateral LE   Hypertension    Left renal atrophy 12/19/2021   LEFT   Neuromuscular disorder (HCC)    small fiber neuropathy   Obesity    Pneumonia    Polycystic ovary    takes metformin  to treat   Skin lesion    Excisional biopsy of moles - none cancerous   Sleep apnea    yrs ago - diagnosed mild sleep apnea - did not have to use cpap    Past Surgical History:  Procedure Laterality Date   BREAST CAPSULECTOMY Bilateral  12/13/2020   1 week later pt had hematoma surgery on left breast   BREAST ENHANCEMENT SURGERY Bilateral 02/11/2017   BREATH TEK H PYLORI N/A 07/22/2013   Procedure: BREATH TEK H PYLORI;  Surgeon: Quitman Bucy, MD;  Location: [facility];  Service: General;  Laterality: N/A;   CESAREAN SECTION  2004, 2007   x 2   COLONOSCOPY  2013   COLONOSCOPY  2021   MS-F/V-sutab (good)-[facility]   CYSTOSCOPY WITH URETHRAL DILATATION  age 63    GASTRIC ROUX-EN-Y N/A 10/31/2013   Procedure: LAPAROSCOPIC ROUX-EN-Y GASTRIC BYPASS WITH UPPER ENDOSCOPY;  Surgeon: Quitman Bucy, MD;  Location: [facility];  Service: General;  Laterality: N/A;   GLUTEUS MINIMUS REPAIR Left 05/28/2021   Procedure: Left GLUTEUS Medius REPAIR;  Surgeon: Wilhelmenia Harada, MD;  Location: [facility];  Service: Orthopedics;  Laterality: Left;   ingrown toenail Right    Ingrown nail on great right toe   kidney stent  08/28/2014   mole excision     "benign  juvenile melanoma" removed from left leg - inner thigh   POLYPECTOMY     2013   SKIN LESION EXCISION     back   TONSILLECTOMY  age 5   TUBAL LIGATION  2018   Patient Active Problem List   Diagnosis Date Noted   Essential hypertension 11/04/2022   Panic disorder (episodic paroxysmal anxiety) 07/10/2022   Major depressive disorder, recurrent, moderate (HCC) 07/10/2022   Left renal atrophy 12/19/2021   Stage 2 chronic kidney disease 11/20/2021   GERD (gastroesophageal reflux disease) 09/27/2021   Chronic pain syndrome 09/27/2021   Asthma 07/18/2020   Chronic myofascial pain 03/18/2018   Small fiber neuropathy 03/18/2018   Idiopathic small fiber sensory neuropathy 02/23/2018   Varicose veins of both lower extremities 01/13/2018   Cervical spondylosis without myelopathy 08/28/2017   Migraine with aura and without status migrainosus, not intractable 07/22/2017   Lumbar degenerative disc disease 07/20/2017   Spondylosis of lumbar spine 07/20/2017   Coccydynia 03/12/2017    Bariatric surgery status 05/02/2016   Tachy-brady syndrome (HCC) 12/19/2015   Iron  deficiency anemia 06/10/2015   Sleep disorder 06/01/2013   Generalized anxiety disorder 05/30/2013   Depression, major, recurrent, severe with psychosis (HCC) 06/17/2012   Eating disorder 12/18/2011   Constipation 01/20/2011   Polycystic ovarian syndrome 09/01/2008    REFERRING DIAG: R10.2,G89.29 (ICD-10-CM) - Chronic pelvic pain in female  THERAPY DIAG:  Muscle weakness (generalized)  Other muscle spasm  Unspecified lack of coordination  Abnormal posture  Other low back pain  Pelvic pain  Rationale for Evaluation and Treatment Rehabilitation  PERTINENT HISTORY: Breast augmentation surgeries with follow-up reduction with complication, c/s X 2, gastric bypass, bil tubal ligation, long history of chronic low back/pelvic pain, pudendal neuralgia, coccydynia, Lt glute med repair, Lt ovarian cyst, pelvic tumor, small fiber neuropathy, Lt hip OA   PRECAUTIONS: extreme tachycardia, no standing exercise - monitor heart rate, pelvic tumor without biopsy, possible cancer? No dx at this point - just watching blood levels   SUBJECTIVE:                                                                                                                                                                                      SUBJECTIVE STATEMENT:   09/28/23: last treatment helped. I am on my cycle.  Pt returns to pelvic floor PT for continued chronic pelvic pain. MD has suggested 3 options: vaginal valium  suppository, botox, pudendal nerve block. She has had recent ultrasound that demonstrated that showed growing pelvic tumor. She is going for follow up ultrasound next week. She also has a Lt ovarian cyst. She has also had a full cardiac work  out and is on no exercise unless is is reclined (POTTs protocol) due to extreme tachycardia when she is standing. She feels like pain is much worse because she has not been able to  exercise. Her husband has had to stop working in order to take care of her at home. Her biggest goal is to be able to sit right now. She is having chronic yeast infections as well.   Problem list: -pudendal neuroalgia/intermittent labial pain -Bil hip and low back pain -being unable to sit -deconditioning -urinary urgency/leaking -constipation  -dyspareunia    PAIN:  PAIN:  Are you having pain? Yes NPRS scale: 8/10 09/25/23: 8/10 09/28/23: pain level 9/10 Pain location: vagina/labia, low back, hips, lower abdomen Pain orientation: Bilateral  PAIN TYPE: aching, burning, sharp, throbbing, tight, and tingling Pain description: constant  Aggravating factors: not exercising Relieving factors: alternating sides, lying on stomach, small amounts of standing     PRECAUTIONS: None   WEIGHT BEARING RESTRICTIONS: No   FALLS:  Has patient fallen in last 6 months? No   LIVING ENVIRONMENT: Lives with: lives with their family Lives in: House/apartment   OCCUPATION: disabled   PLOF: Independent   PATIENT GOALS: would like to be able to sit; decrease bil hip/low back pain    BOWEL MOVEMENT: Pain with bowel movement: No Type of bowel movement:Frequency 1x/day to every other day and Strain Yes Fully empty rectum: No Leakage: No Pads: No Fiber supplement: No no medication to help stimulate bowel movements   URINATION: Pain with urination: No Fully empty bladder: No Stream:  trouble starting the stream - worse at night Urgency: yes Frequency: every 30 minutes to an hour Leakage:  Laugh, cough, sneeze Pads: No due to irritation    INTERCOURSE: Pain with intercourse: Initial Penetration and During Penetration Ability to have vaginal penetration:  Yes: pain  Climax: yes Marinoff Scale: 2/3   PREGNANCY: Vaginal deliveries 0 Tearing No C-section deliveries 2 Currently pregnant No       OBJECTIVE:   09/03/23:  PATIENT SURVEYS:   PFIQ-7: 100  COGNITION: Overall  cognitive status: Within functional limits for tasks assessed     SENSATION: Light touch: Appears intact  FUNCTIONAL TESTS:  Single leg stance:  Rt: pelvic drop with UE support  Lt: pelvic drop with UE support  5xSTS: 36 seconds  GAIT: Assistive device utilized: None Comments: WNL  POSTURE: No Significant postural limitations   LUMBARAROM/PROM:  A/PROM A/PROM  Eval (% available)  Flexion 25  Extension 20  Right lateral flexion 50  Left lateral flexion 50  Right rotation 50  Left rotation 50   (Blank rows = not tested)  PALPATION:   General: lumbar paraspinal and oblique muscle restriction  Pelvic Alignment: Lt iliac crest elevation, significant Rt posterior rotation   Abdominal: lower abdominal tenderness                 External Perineal Exam: redness and irritation equal bil                             Internal Pelvic Floor: sharp, tender, pain throughout superficial muscles; achy and restricted in bil levator ani with more pain on the Rt compared to the Lt; dryness; paradoxical pelvic floor bearing down  Patient confirms identification and approves PT to assess internal pelvic floor and treatment Yes  PELVIC MMT:   MMT eval  Vaginal 1/5, 5 second hold, 2 repeat contractions - very  difficult time with coordination  (Blank rows = not tested)        TONE: High with pain  PROLAPSE: None noted today; she did have paradoxical bearing down contraction so may not have been able to detect     TODAY'S TREATMENT  09/28/23 Manual: Joint mobilization:   Mobilization of L4-S1 in prone grade 2 and 3 to work on movement  Mobilization of the sacrum to reduce the pain with sitting Myofascial release: Fascial release with one hand on sacrum and other on T6 area and give slight distraction going through the restrictions slowly in prone Craniosacral work to the skull to improve the spinal fluid movement for pain relief of the lumbar Distraction of the thoracic spine,  cervical and lumbar to decompress and reduce pain Fascial release of the side of the lumbar going through the restrictions.  Neuromuscular re-education: Form correction: Prone hip internal and external rotation with assistance to work on motion and mobility of the SI joint Down training: Diaphragmatic breathing in prone with filling up the back of the rib cage and sides with therapist giving tactile cues    09/25/23 Manual: Myofascial release: Prone one hand on sacrum and other in area of uterus and going through the different restrictions Fascial release of the obturator internist in prone bilaterally with moving the hips into internal and external rotation and holding 90 seconds for trigger points Fascial release with one hand on sacrum and other on T6 area and give slight distraction going through the restrictions slowly in prone    EVAL Therapeutic activities: Pt education on frequency and duration of care to work on seeing progress with pudendal neuralgia Talking with MD about possible benefit of estrogen cream due to dryness  Pt education on findings, amount of muscle tension, paradoxical pelvic floor muscle contraction    PATIENT EDUCATION:  09/28/23 Education details: Access Code: [email or web address] Person educated: Patient Education method: Explanation, Demonstration, Tactile cues, Verbal cues, and Handouts Education comprehension: verbalized understanding   HOME EXERCISE PROGRAM: 09/28/23 Access Code: [email or web address] URL: [email or web address] Date: 09/28/2023 Prepared by: Marsha Skeen  Exercises - Prone Diaphragmatic Breathing  - 2 x daily - 7 x weekly - 1 sets - 10 reps - Prone Hip External Rotation AROM  - 1 x daily - 7 x weekly - 1 sets - 10 reps   ASSESSMENT:   CLINICAL IMPRESSION: Pt is a 48 year old female returning to pelvic floor PT in order to reassess pelvic pain, pudendal neuralgia, inability to sit, low back pain, bowel/bladder dysfunction, and dyspareunia.  Due to diagnosis of cardiac dysfunction, pelvic tumor, and dysautonomia, her main goals currently are to be able to sit for prolonged periods of time without notable pain and reutrn to intercourse with husband without severe pain (who is out of work to help take care of her). She reports reduction of pain at end of treatment from 9/10 to 7/10. She was able to move better and stand up straighter after treatment. She had improved fascial movement of the back.   She will continue to benefit from skilled PT intervention in order to decrease pain, improve functional ability specifically sitting tolerance, increase ease of starting stream/emptying bladder, decrease dyspareunia, and improve QOL.    OBJECTIVE IMPAIRMENTS: decreased activity tolerance, decreased coordination, decreased endurance, decreased mobility, decreased ROM, decreased strength, hypomobility, increased fascial restrictions, increased muscle spasms, impaired flexibility, impaired tone, postural dysfunction, and pain.    ACTIVITY LIMITATIONS: bending, sitting, standing, stairs, transfers, bed mobility, and locomotion level, vaginal  penetration   PARTICIPATION LIMITATIONS: cleaning, laundry, interpersonal relationship, driving, and community activity   PERSONAL FACTORS: 3+ comorbidities: Breast augmentation surgeries with follow-up reduction with complication, c/s X 2, gastric bypass, bil tubal ligation, long history of chronic low back/pelvic pain, pudendal neuralgia, coccydynia, Lt glute med repair, Lt ovarian cyst, pelvic tumor, small fiber neuropathy, Lt hip OA  are also affecting patient's functional outcome.    REHAB POTENTIAL: Good   CLINICAL DECISION MAKING: Stable/uncomplicated   EVALUATION COMPLEXITY: Low     GOALS: Goals reviewed with patient? Yes   SHORT TERM GOALS: Target date: 10/01/2023   Pt will be independent with HEP.    Baseline: Goal status: INITIAL   2.  Pt will be independent with diaphragmatic breathing and  down training activities in order to improve pelvic floor relaxation and abdominal wall mobility.   Baseline:  Goal status: INITIAL   3.  Pt will report improve stream initiation and complete voiding with urination in order to decrease urinary leaking that causes skin irritation. Baseline:  Goal status: INITIAL              4.  Pt will increase all impaired lumbar A/ROM by 25% without pain in order to decrease pelvic pain and improve pelvic floor muscle length tension relationship.   Baseline:  Goal status: INITIAL  5. Pt will stop straining with bowel movements to decrease strain on pelvic floor muscles in order to reduce pelvic pain, allowing her to sit for longer periods of time.   Baseline:  Goal status: INITIAL   LONG TERM GOALS: Target date: 11/26/23   Pt will be independent with advanced HEP.    Baseline:  Goal status:INITIAL   2.  Pt will be able to have intercourse with no greater than 5/10 pain in order to maintain intimate relationship with partner.  Baseline:  Goal status: INITIAL   3.  Pt will be able to ascend/descend 12 steps at regular intervals throughout the day safely and with minimal difficulty in order to perform household chores.  Baseline:  Goal status: INITIAL   4.  Pt will improve 5xSTS to less than 20 seconds and improved PFIQ by 25% in order to demonstrate improved QOL and functional ability.  Baseline:  Goal status: INITIAL   5.  Patient will report no greater than 5/10 pain with sitting in order to more easily attend appointments, drive, and spend time with family.  Baseline:  Goal status: INITIAL   6.  Pt will be able to sit for >30 minutes in order to allow heart rate to remain lower and perform seated exercise.    Baseline:  Goal status: INITIAL  7. Pt will be able to stand on one leg in order to put on pants on without pain or difficulty.    Baseline: unable/causing severe pain the rest of the day Goal status: INITIAL   PLAN:   PT  FREQUENCY: 1x/week   PT DURATION: 12 weeks   PLANNED INTERVENTIONS: Therapeutic exercises, Therapeutic activity, Neuromuscular re-education, Balance training, Gait training, Patient/Family education, Self Care, Joint mobilization, Aquatic Therapy, Dry Needling, Biofeedback, and Manual therapy   PLAN FOR NEXT SESSION: Internal manual techniques to help improve pelvic floor muscle spasm and pain. Work on pelvic alignment. Seems to be a candidate for cranio sacral work, she responds to fascial work, worked around the uterus to reduce restrictions    Marsha Skeen, PT 09/28/23 2:02 PM

## 2023-09-30 ENCOUNTER — Ambulatory Visit: Admitting: Family Medicine

## 2023-09-30 VITALS — BP 134/82 | HR 91 | Ht 65.0 in | Wt 267.2 lb

## 2023-09-30 DIAGNOSIS — T17908A Unspecified foreign body in respiratory tract, part unspecified causing other injury, initial encounter: Secondary | ICD-10-CM

## 2023-09-30 DIAGNOSIS — D72820 Lymphocytosis (symptomatic): Secondary | ICD-10-CM

## 2023-09-30 DIAGNOSIS — R112 Nausea with vomiting, unspecified: Secondary | ICD-10-CM

## 2023-09-30 DIAGNOSIS — I495 Sick sinus syndrome: Secondary | ICD-10-CM

## 2023-09-30 DIAGNOSIS — T17908D Unspecified foreign body in respiratory tract, part unspecified causing other injury, subsequent encounter: Secondary | ICD-10-CM | POA: Diagnosis not present

## 2023-09-30 DIAGNOSIS — R635 Abnormal weight gain: Secondary | ICD-10-CM | POA: Diagnosis not present

## 2023-09-30 MED ORDER — PROMETHAZINE HCL 25 MG PO TABS: 25.0000 mg | ORAL_TABLET | Freq: Three times a day (TID) | ORAL | 6 refills | Status: DC | PRN

## 2023-09-30 NOTE — Progress Notes (Signed)
 Subjective:    Patient ID: Katelyn [name], female    DOB: June 13, 1975, 48 y.o.   MRN: [id number]  HPI Patient arrives for a follow up visit. Patient would like to discuss rapid weight gain and wheezing r/t aspiration. Patient would also like refill of nausea med. Discussed the use of AI scribe software for clinical note transcription with the patient, who gave verbal consent to proceed. Discussed the use of AI scribe software for clinical note transcription with the patient, who gave verbal consent to proceed.  History of Present Illness   Katelyn [name] is a 48 year old female with dysautonomia who presents with gastrointestinal symptoms and rapid weight gain.  She experiences daily nausea and vomiting a couple of times a week. Her bowel habits fluctuate, with constipation occurring every other day and diarrhea up to ten times a day. No mucus or blood in bowel movements. Her diet is limited to bland foods such as bananas, grits, plain chicken, cottage cheese, and low sodium wheat products. She consumes about half a cup of cottage cheese per day and less than three ounces of meat daily. Protein bars and drinks generally cause nausea and vomiting, although she previously tolerated a flavorless protein powder.  She has experienced a rapid weight gain of approximately 22 pounds over the past month, despite not eating much due to nausea and vomiting. She previously maintained a stable weight of 245 pounds for years but is now at 267 pounds. She experiences significant swelling in her right leg. She uses a walker for mobility.  She reports daily episodes of aspiration, particularly when eating, which leads to choking and requires assistance from her family. This is accompanied by wheezing and bronchial symptoms, especially in the mornings. She raises the head of her bed to reduce nighttime aspiration. She drinks diet ginger ale and reports regurgitation primarily when drinking, feeling as though  liquids enter her airway rather than her esophagus.  She sleeps an average of four hours per day and denies daytime sleepiness. She has a history of dysautonomia, with symptoms managed by various specialists. She finds it challenging to increase fluid and sodium intake due to nausea and her single kidney. She is unable to meet the recommended three liters of fluid and twelve grams of sodium per day.  Her family is concerned about her breathing difficulties and the potential for aspiration pneumonia. She uses Purixan for nausea, which also helps with her appetite, but she is currently out of the medication. She has a history of high white blood cell and platelet counts, with previous workups including a bone marrow biopsy that did not confirm cancer. She expresses frustration with ongoing symptoms and the lack of a definitive diagnosis.     Patient does state she gets a lot of abdominal symptoms nausea vomiting abdominal fullness pain and discomfort and unexplained weight gain  Review of Systems     Objective:   Physical Exam General-in no acute distress Eyes-no discharge Lungs-respiratory rate normal, CTA CV-no murmurs,RRR Extremities skin warm dry no edema Neuro grossly normal Behavior normal, alert        Assessment & Plan:  Assessment and Plan    Aspiration Risk Daily aspiration during eating increases risk of aspiration pneumonia, potentially leading to lung scarring and decreased infection resistance. - Order swallow study to assess swallowing function and guide dietary modifications.  Nausea and Vomiting Chronic nausea and vomiting managed with purixan, preferred for its effectiveness in increasing appetite post-nausea. - Prescribe purixan  with refills.  Unexplained Weight Gain Rapid weight gain with possible fluid retention, especially in the right leg. Differential includes fluid retention versus adipose tissue gain. - Order abdominal CT scan with contrast to evaluate  underlying causes.  Dysautonomia Chronic dysautonomia with heart rate and temperature dysregulation, likely secondary to small fiber neuropathy. Managed by specialists. - Continue management with respective specialists.  Chronic Inflammation Persistent elevated white blood cell and platelet counts, possibly autoimmune-related. Previous evaluations inconclusive. - Refer to hematology oncology at [facility] for further evaluation.  Pelvic Pain and Uterine Thickening Pelvic ultrasound showed uterine thickening. Family history of uterine and ovarian cancer noted. Biopsy or MRI discussed if symptoms persist or worsen. - Discuss potential for biopsy or MRI of pelvis if symptoms persist or worsen.       1. Weight gain (Primary) Has had significant weight gain unfortunately her dietary intake is mainly carbohydrates and he is very inactive because of her severe nausea and small fiber problems as well as dizziness  2. Nausea and vomiting, unspecified vomiting type Patient with severe nausea and vomiting nauseated every day and has intermittent vomiting and intermittent aspiration issues on a regular relatively frequent basis She relates she has aspiration when she vomits she also has cysts when she tries to swallow 3. Tachy-brady syndrome (HCC) Gets dizzy quickly tries to take an adequate amount of liquids  4. Lymphocytosis Will need further workup.  Will help her get in with oncology at [facility]  Aspiration issues-speech therapist swallow study recommended  Follow-up within 3 months to 4 months

## 2023-10-01 ENCOUNTER — Other Ambulatory Visit (HOSPITAL_COMMUNITY): Payer: Self-pay | Admitting: Occupational Therapy

## 2023-10-01 ENCOUNTER — Other Ambulatory Visit: Payer: Self-pay

## 2023-10-01 DIAGNOSIS — T17908A Unspecified foreign body in respiratory tract, part unspecified causing other injury, initial encounter: Secondary | ICD-10-CM

## 2023-10-01 DIAGNOSIS — R1110 Vomiting, unspecified: Secondary | ICD-10-CM

## 2023-10-01 DIAGNOSIS — R110 Nausea: Secondary | ICD-10-CM

## 2023-10-01 DIAGNOSIS — K219 Gastro-esophageal reflux disease without esophagitis: Secondary | ICD-10-CM

## 2023-10-01 DIAGNOSIS — D72820 Lymphocytosis (symptomatic): Secondary | ICD-10-CM

## 2023-10-01 DIAGNOSIS — R1312 Dysphagia, oropharyngeal phase: Secondary | ICD-10-CM

## 2023-10-06 DIAGNOSIS — Z1231 Encounter for screening mammogram for malignant neoplasm of breast: Secondary | ICD-10-CM | POA: Diagnosis not present

## 2023-10-06 DIAGNOSIS — R92323 Mammographic fibroglandular density, bilateral breasts: Secondary | ICD-10-CM | POA: Diagnosis not present

## 2023-10-08 DIAGNOSIS — F411 Generalized anxiety disorder: Secondary | ICD-10-CM | POA: Diagnosis not present

## 2023-10-08 DIAGNOSIS — F331 Major depressive disorder, recurrent, moderate: Secondary | ICD-10-CM | POA: Diagnosis not present

## 2023-10-09 ENCOUNTER — Ambulatory Visit: Payer: Self-pay | Admitting: Family Medicine

## 2023-10-09 ENCOUNTER — Ambulatory Visit (HOSPITAL_COMMUNITY)
Admission: RE | Admit: 2023-10-09 | Discharge: 2023-10-09 | Disposition: A | Discharge status: Home / Self Care | Source: Ambulatory Visit | Attending: Family Medicine | Admitting: Family Medicine

## 2023-10-09 ENCOUNTER — Ambulatory Visit (HOSPITAL_COMMUNITY): Attending: Family Medicine | Admitting: Speech Pathology

## 2023-10-09 DIAGNOSIS — K219 Gastro-esophageal reflux disease without esophagitis: Secondary | ICD-10-CM | POA: Insufficient documentation

## 2023-10-09 DIAGNOSIS — R1312 Dysphagia, oropharyngeal phase: Secondary | ICD-10-CM | POA: Diagnosis not present

## 2023-10-13 ENCOUNTER — Other Ambulatory Visit: Payer: Self-pay | Admitting: Family Medicine

## 2023-10-26 ENCOUNTER — Ambulatory Visit (HOSPITAL_COMMUNITY)
Admission: RE | Admit: 2023-10-26 | Discharge: 2023-10-26 | Disposition: A | Discharge status: Home / Self Care | Source: Ambulatory Visit | Attending: Family Medicine | Admitting: Family Medicine

## 2023-10-26 ENCOUNTER — Other Ambulatory Visit (HOSPITAL_COMMUNITY)

## 2023-10-26 ENCOUNTER — Ambulatory Visit (HOSPITAL_COMMUNITY): Admitting: Speech Pathology

## 2023-10-26 DIAGNOSIS — D259 Leiomyoma of uterus, unspecified: Secondary | ICD-10-CM | POA: Diagnosis not present

## 2023-10-26 DIAGNOSIS — Z9884 Bariatric surgery status: Secondary | ICD-10-CM | POA: Diagnosis not present

## 2023-10-26 DIAGNOSIS — R1110 Vomiting, unspecified: Secondary | ICD-10-CM | POA: Insufficient documentation

## 2023-10-26 DIAGNOSIS — R160 Hepatomegaly, not elsewhere classified: Secondary | ICD-10-CM | POA: Insufficient documentation

## 2023-10-26 DIAGNOSIS — K760 Fatty (change of) liver, not elsewhere classified: Secondary | ICD-10-CM | POA: Insufficient documentation

## 2023-10-26 DIAGNOSIS — F331 Major depressive disorder, recurrent, moderate: Secondary | ICD-10-CM | POA: Diagnosis not present

## 2023-10-26 DIAGNOSIS — R110 Nausea: Secondary | ICD-10-CM | POA: Insufficient documentation

## 2023-10-26 DIAGNOSIS — R112 Nausea with vomiting, unspecified: Secondary | ICD-10-CM | POA: Diagnosis not present

## 2023-10-26 DIAGNOSIS — K828 Other specified diseases of gallbladder: Secondary | ICD-10-CM | POA: Diagnosis not present

## 2023-10-26 DIAGNOSIS — F411 Generalized anxiety disorder: Secondary | ICD-10-CM | POA: Diagnosis not present

## 2023-10-26 LAB — POCT I-STAT CREATININE: Creatinine, Ser: 0.9 mg/dL (ref 0.44–1.00)

## 2023-10-26 MED ORDER — IOHEXOL 300 MG/ML  SOLN
100.0000 mL | Freq: Once | INTRAMUSCULAR | Status: AC | PRN
  Administered 2023-10-26: 100 mL via INTRAVENOUS

## 2023-10-28 ENCOUNTER — Encounter: Payer: Self-pay | Admitting: Physical Therapy

## 2023-10-28 DIAGNOSIS — Z79891 Long term (current) use of opiate analgesic: Secondary | ICD-10-CM | POA: Diagnosis not present

## 2023-10-28 DIAGNOSIS — R102 Pelvic and perineal pain: Secondary | ICD-10-CM | POA: Diagnosis not present

## 2023-10-28 DIAGNOSIS — G894 Chronic pain syndrome: Secondary | ICD-10-CM | POA: Diagnosis not present

## 2023-10-28 DIAGNOSIS — G8929 Other chronic pain: Secondary | ICD-10-CM | POA: Diagnosis not present

## 2023-10-28 DIAGNOSIS — Z5181 Encounter for therapeutic drug level monitoring: Secondary | ICD-10-CM | POA: Diagnosis not present

## 2023-10-29 ENCOUNTER — Ambulatory Visit

## 2023-11-01 ENCOUNTER — Ambulatory Visit: Payer: Self-pay | Admitting: Family Medicine

## 2023-11-02 DIAGNOSIS — B3731 Acute candidiasis of vulva and vagina: Secondary | ICD-10-CM | POA: Diagnosis not present

## 2023-11-03 ENCOUNTER — Ambulatory Visit: Attending: Obstetrics and Gynecology

## 2023-11-03 DIAGNOSIS — R279 Unspecified lack of coordination: Secondary | ICD-10-CM | POA: Insufficient documentation

## 2023-11-03 DIAGNOSIS — R293 Abnormal posture: Secondary | ICD-10-CM | POA: Insufficient documentation

## 2023-11-03 DIAGNOSIS — M6281 Muscle weakness (generalized): Secondary | ICD-10-CM | POA: Diagnosis not present

## 2023-11-03 DIAGNOSIS — M62838 Other muscle spasm: Secondary | ICD-10-CM | POA: Diagnosis not present

## 2023-11-03 DIAGNOSIS — M25552 Pain in left hip: Secondary | ICD-10-CM | POA: Insufficient documentation

## 2023-11-03 DIAGNOSIS — R102 Pelvic and perineal pain: Secondary | ICD-10-CM | POA: Insufficient documentation

## 2023-11-03 DIAGNOSIS — M5459 Other low back pain: Secondary | ICD-10-CM | POA: Diagnosis not present

## 2023-11-03 NOTE — Therapy (Addendum)
 OUTPATIENT PHYSICAL THERAPY TREATMENT NOTE   Patient Name: Katelyn [name] MRN: [id number] DOB:25-Sep-1975, 48 y.o., female Today's Date: 11/03/2023  PCP: Alphonsa Glendia LABOR, MD  REFERRING PROVIDER: Jeralyn Crutch, MD   END OF SESSION:   PT End of Session - 11/03/23 0849     Visit Number 4    Date for PT Re-Evaluation 11/26/23    Authorization Type HTA    PT Start Time 0845    PT Stop Time 0927    PT Time Calculation (min) 42 min    Activity Tolerance Patient tolerated treatment well    Behavior During Therapy [facility] for tasks assessed/performed                  Past Medical History:  Diagnosis Date   Anemia    Anorexia    Anxiety    on meds   Asthma    controlled with meds   Benign juvenile melanoma    Chronic headaches    Colon polyps    found on colonoscopy 04/26/2012   Complication of anesthesia    itching after epidural for c section   Constipation    Depression    on meds   Depression    several suicide attempts, hospitaluzed in 2012 for this; hx pf ECT treatments ; pt sees Dr. Arfeen pyschiatrist  and doing well on medication   Dysrhythmia    hx of PVC's (pt hasn't seen cardiologist since 2021- no follow up needed)   GERD (gastroesophageal reflux disease)    Headache    Heart murmur    as a child - no one mentions hearing murmur anymore, sometimes it is heard. Echo has been performed 02/03/18   Heartburn    no meds   History of pneumonia    x 3  years ago - no recent problems   Hx of blood clots 2019   bilateral LE   Hypertension    Left renal atrophy 12/19/2021   LEFT   Neuromuscular disorder (HCC)    small fiber neuropathy   Obesity    Pneumonia    Polycystic ovary    takes metformin  to treat   Skin lesion    Excisional biopsy of moles - none cancerous   Sleep apnea    yrs ago - diagnosed mild sleep apnea - did not have to use cpap    Past Surgical History:  Procedure Laterality Date   BREAST CAPSULECTOMY Bilateral 12/13/2020    1 week later pt had hematoma surgery on left breast   BREAST ENHANCEMENT SURGERY Bilateral 02/11/2017   BREATH TEK H PYLORI N/A 07/22/2013   Procedure: BREATH TEK H PYLORI;  Surgeon: Morene ONEIDA Olives, MD;  Location: [facility];  Service: General;  Laterality: N/A;   CESAREAN SECTION  2004, 2007   x 2   COLONOSCOPY  2013   COLONOSCOPY  2021   MS-F/V-sutab (good)-[facility]   CYSTOSCOPY WITH URETHRAL DILATATION  age 47    GASTRIC ROUX-EN-Y N/A 10/31/2013   Procedure: LAPAROSCOPIC ROUX-EN-Y GASTRIC BYPASS WITH UPPER ENDOSCOPY;  Surgeon: Morene ONEIDA Olives, MD;  Location: [facility];  Service: General;  Laterality: N/A;   GLUTEUS MINIMUS REPAIR Left 05/28/2021   Procedure: Left GLUTEUS Medius REPAIR;  Surgeon: Genelle Standing, MD;  Location: [facility] OR;  Service: Orthopedics;  Laterality: Left;   ingrown toenail Right    Ingrown nail on great right toe   kidney stent  08/28/2014   mole excision     benign juvenile melanoma removed  from left leg - inner thigh   POLYPECTOMY     2013   SKIN LESION EXCISION     back   TONSILLECTOMY  age 23   TUBAL LIGATION  2018   Patient Active Problem List   Diagnosis Date Noted   Essential hypertension 11/04/2022   Panic disorder (episodic paroxysmal anxiety) 07/10/2022   Major depressive disorder, recurrent, moderate (HCC) 07/10/2022   Left renal atrophy 12/19/2021   Stage 2 chronic kidney disease 11/20/2021   GERD (gastroesophageal reflux disease) 09/27/2021   Chronic pain syndrome 09/27/2021   Asthma 07/18/2020   Chronic myofascial pain 03/18/2018   Small fiber neuropathy 03/18/2018   Idiopathic small fiber sensory neuropathy 02/23/2018   Varicose veins of both lower extremities 01/13/2018   Cervical spondylosis without myelopathy 08/28/2017   Migraine with aura and without status migrainosus, not intractable 07/22/2017   Lumbar degenerative disc disease 07/20/2017   Spondylosis of lumbar spine 07/20/2017   Coccydynia 03/12/2017   Bariatric surgery  status 05/02/2016   Tachy-brady syndrome (HCC) 12/19/2015   Iron  deficiency anemia 06/10/2015   Sleep disorder 06/01/2013   Generalized anxiety disorder 05/30/2013   Depression, major, recurrent, severe with psychosis (HCC) 06/17/2012   Eating disorder 12/18/2011   Constipation 01/20/2011   Polycystic ovarian syndrome 09/01/2008    REFERRING DIAG: R10.2,G89.29 (ICD-10-CM) - Chronic pelvic pain in female  THERAPY DIAG:  Muscle weakness (generalized)  Other muscle spasm  Unspecified lack of coordination  Abnormal posture  Other low back pain  Pelvic pain  Pain in left hip  Rationale for Evaluation and Treatment Rehabilitation  PERTINENT HISTORY: Breast augmentation surgeries with follow-up reduction with complication, c/s X 2, gastric bypass, bil tubal ligation, long history of chronic low back/pelvic pain, pudendal neuralgia, coccydynia, Lt glute med repair, Lt ovarian cyst, pelvic tumor, small fiber neuropathy, Lt hip OA   PRECAUTIONS: extreme tachycardia, no standing exercise - monitor heart rate, pelvic tumor without biopsy, possible cancer? No dx at this point - just watching blood levels   SUBJECTIVE:                                                                                                                                                                                      SUBJECTIVE STATEMENT:   Pt states that the manual work in the last several sessions have been very helpful. She is dealing with rapid weight gain. She is having very heavy cycle. She would like to hold off on internal vaginal exam due to severe yeast infection.   Problem list: -pudendal neuroalgia/intermittent labial pain -Bil hip and low back pain -being unable to sit -deconditioning -urinary urgency/leaking -constipation  -dyspareunia  PAIN: 11/03/23 PAIN:  Are you having pain? Yes NPRS scale: 10/10 Pain location: vagina/labia, low back, hips, lower abdomen Pain orientation:  Bilateral  PAIN TYPE: aching, burning, sharp, throbbing, tight, and tingling Pain description: constant  Aggravating factors: not exercising Relieving factors: alternating sides, lying on stomach, small amounts of standing     PRECAUTIONS: None   WEIGHT BEARING RESTRICTIONS: No   FALLS:  Has patient fallen in last 6 months? No   LIVING ENVIRONMENT: Lives with: lives with their family Lives in: House/apartment   OCCUPATION: disabled   PLOF: Independent   PATIENT GOALS: would like to be able to sit; decrease bil hip/low back pain    BOWEL MOVEMENT: Pain with bowel movement: No Type of bowel movement:Frequency 1x/day to every other day and Strain Yes Fully empty rectum: No Leakage: No Pads: No Fiber supplement: No no medication to help stimulate bowel movements   URINATION: Pain with urination: No Fully empty bladder: No Stream:  trouble starting the stream - worse at night Urgency: yes Frequency: every 30 minutes to an hour Leakage:  Laugh, cough, sneeze Pads: No due to irritation    INTERCOURSE: Pain with intercourse: Initial Penetration and During Penetration Ability to have vaginal penetration:  Yes: pain  Climax: yes Marinoff Scale: 2/3   PREGNANCY: Vaginal deliveries 0 Tearing No C-section deliveries 2 Currently pregnant No       OBJECTIVE:   09/03/23:  PATIENT SURVEYS:   PFIQ-7: 100  COGNITION: Overall cognitive status: Within functional limits for tasks assessed     SENSATION: Light touch: Appears intact  FUNCTIONAL TESTS:  Single leg stance:  Rt: pelvic drop with UE support  Lt: pelvic drop with UE support  5xSTS: 36 seconds  GAIT: Assistive device utilized: None Comments: WNL  POSTURE: No Significant postural limitations   LUMBARAROM/PROM:  A/PROM A/PROM  Eval (% available)  Flexion 25  Extension 20  Right lateral flexion 50  Left lateral flexion 50  Right rotation 50  Left rotation 50   (Blank rows = not  tested)  PALPATION:   General: lumbar paraspinal and oblique muscle restriction  Pelvic Alignment: Lt iliac crest elevation, significant Rt posterior rotation   Abdominal: lower abdominal tenderness                 External Perineal Exam: redness and irritation equal bil                             Internal Pelvic Floor: sharp, tender, pain throughout superficial muscles; achy and restricted in bil levator ani with more pain on the Rt compared to the Lt; dryness; paradoxical pelvic floor bearing down  Patient confirms identification and approves PT to assess internal pelvic floor and treatment Yes  PELVIC MMT:   MMT eval  Vaginal 1/5, 5 second hold, 2 repeat contractions - very difficult time with coordination  (Blank rows = not tested)        TONE: High with pain  PROLAPSE: None noted today; she did have paradoxical bearing down contraction so may not have been able to detect     TODAY'S TREATMENT  11/03/23 Manual: Myofascial release to thoracic and lumbar spine and paraspinals Prone rhythmic rotation of bil LE 20x bil Sacral and L1-5 PA mobilizations grade 1 Soft tissue mobilization to Rt lateral coccyx border and piriformis  Neuromuscular re-education: Prone glute sets 8 x 5 seconds  Prone hip internal/external rotation 2 x 10 Prone  hip internal rotation isometrics with ball at ankles 8 x 5 seconds   09/28/23 Manual: Joint mobilization:   Mobilization of L4-S1 in prone grade 2 and 3 to work on movement  Mobilization of the sacrum to reduce the pain with sitting Myofascial release: Fascial release with one hand on sacrum and other on T6 area and give slight distraction going through the restrictions slowly in prone Craniosacral work to the skull to improve the spinal fluid movement for pain relief of the lumbar Distraction of the thoracic spine, cervical and lumbar to decompress and reduce pain Fascial release of the side of the lumbar going through the restrictions.   Neuromuscular re-education: Form correction: Prone hip internal and external rotation with assistance to work on motion and mobility of the SI joint Down training: Diaphragmatic breathing in prone with filling up the back of the rib cage and sides with therapist giving tactile cues    09/25/23 Manual: Myofascial release: Prone one hand on sacrum and other in area of uterus and going through the different restrictions Fascial release of the obturator internist in prone bilaterally with moving the hips into internal and external rotation and holding 90 seconds for trigger points Fascial release with one hand on sacrum and other on T6 area and give slight distraction going through the restrictions slowly in prone   PATIENT EDUCATION:  09/28/23 Education details: Access Code: [email or web address] Person educated: Patient Education method: Explanation, Demonstration, Tactile cues, Verbal cues, and Handouts Education comprehension: verbalized understanding   HOME EXERCISE PROGRAM: 09/28/23 Access Code: [email or web address] URL: [email or web address] Date: 09/28/2023 Prepared by: Channing Pereyra  Exercises - Prone Diaphragmatic Breathing  - 2 x daily - 7 x weekly - 1 sets - 10 reps - Prone Hip External Rotation AROM  - 1 x daily - 7 x weekly - 1 sets - 10 reps   ASSESSMENT:   CLINICAL IMPRESSION: Pt is a 48 year old female returning to pelvic floor PT in order to reassess pelvic pain, pudendal neuralgia, inability to sit, low back pain, bowel/bladder dysfunction, and dyspareunia. Today we focused on manual techniques to help reduce her 10/10 pain and we were able to decrease to 7/10 with improved mobility. She was also able to perform several prone exercises to work on circulation, proprioception of gluteal relaxation, gentle strengthening, and mobility with very good tolerance even though she fatigued quickly. She will continue to benefit from skilled PT intervention in order to decrease pain, improve  functional ability specifically sitting tolerance, increase ease of starting stream/emptying bladder, decrease dyspareunia, and improve QOL.    OBJECTIVE IMPAIRMENTS: decreased activity tolerance, decreased coordination, decreased endurance, decreased mobility, decreased ROM, decreased strength, hypomobility, increased fascial restrictions, increased muscle spasms, impaired flexibility, impaired tone, postural dysfunction, and pain.    ACTIVITY LIMITATIONS: bending, sitting, standing, stairs, transfers, bed mobility, and locomotion level, vaginal penetration   PARTICIPATION LIMITATIONS: cleaning, laundry, interpersonal relationship, driving, and community activity   PERSONAL FACTORS: 3+ comorbidities: Breast augmentation surgeries with follow-up reduction with complication, c/s X 2, gastric bypass, bil tubal ligation, long history of chronic low back/pelvic pain, pudendal neuralgia, coccydynia, Lt glute med repair, Lt ovarian cyst, pelvic tumor, small fiber neuropathy, Lt hip OA  are also affecting patient's functional outcome.    REHAB POTENTIAL: Good   CLINICAL DECISION MAKING: Stable/uncomplicated   EVALUATION COMPLEXITY: Low     GOALS: Goals reviewed with patient? Yes   SHORT TERM GOALS: Updated 11/03/23   Pt will be independent with HEP.    Baseline:  Goal status: MET 11/03/23   2.  Pt will be independent with diaphragmatic breathing and down training activities in order to improve pelvic floor relaxation and abdominal wall mobility.   Baseline:  Goal status: IN PROGRESS 11/03/23   3.  Pt will report improve stream initiation and complete voiding with urination in order to decrease urinary leaking that causes skin irritation. Baseline:  Goal status: IN PROGRESS 11/03/23              4.  Pt will increase all impaired lumbar A/ROM by 25% without pain in order to decrease pelvic pain and improve pelvic floor muscle length tension relationship.   Baseline:  Goal status: IN PROGRESS  11/03/23  5. Pt will stop straining with bowel movements to decrease strain on pelvic floor muscles in order to reduce pelvic pain, allowing her to sit for longer periods of time.   Baseline:  Goal status: IN PROGRESS 11/03/23   LONG TERM GOALS: Updated 11/03/23   Pt will be independent with advanced HEP.    Baseline:  Goal status:IN PROGRESS 11/03/23   2.  Pt will be able to have intercourse with no greater than 5/10 pain in order to maintain intimate relationship with partner.  Baseline:  Goal status: IN PROGRESS 11/03/23   3.  Pt will be able to ascend/descend 12 steps at regular intervals throughout the day safely and with minimal difficulty in order to perform household chores.  Baseline:  Goal status: IN PROGRESS 11/03/23   4.  Pt will improve 5xSTS to less than 20 seconds and improved PFIQ by 25% in order to demonstrate improved QOL and functional ability.  Baseline:  Goal status: IN PROGRESS 11/03/23   5.  Patient will report no greater than 5/10 pain with sitting in order to more easily attend appointments, drive, and spend time with family.  Baseline:  Goal status: IN PROGRESS 11/03/23   6.  Pt will be able to sit for >30 minutes in order to allow heart rate to remain lower and perform seated exercise.    Baseline:  Goal status: IN PROGRESS 11/03/23  7. Pt will be able to stand on one leg in order to put on pants on without pain or difficulty.    Baseline: unable/causing severe pain the rest of the day Goal status: IN PROGRESS 11/03/23   PLAN:   PT FREQUENCY: 1x/week   PT DURATION: 12 weeks   PLANNED INTERVENTIONS: Therapeutic exercises, Therapeutic activity, Neuromuscular re-education, Balance training, Gait training, Patient/Family education, Self Care, Joint mobilization, Aquatic Therapy, Dry Needling, Biofeedback, and Manual therapy   PLAN FOR NEXT SESSION: Internal manual techniques to help improve pelvic floor muscle spasm and pain. Work on pelvic alignment. Seems to be a  candidate for cranio sacral work, she responds to fascial work, worked around the uterus to reduce restrictions    Josette Mares, PT, DPT07/08/259:29 AM

## 2023-11-05 ENCOUNTER — Encounter: Payer: Self-pay | Admitting: Family Medicine

## 2023-11-05 ENCOUNTER — Ambulatory Visit

## 2023-11-05 DIAGNOSIS — M6281 Muscle weakness (generalized): Secondary | ICD-10-CM

## 2023-11-05 DIAGNOSIS — R293 Abnormal posture: Secondary | ICD-10-CM

## 2023-11-05 DIAGNOSIS — M25552 Pain in left hip: Secondary | ICD-10-CM

## 2023-11-05 DIAGNOSIS — M62838 Other muscle spasm: Secondary | ICD-10-CM

## 2023-11-05 DIAGNOSIS — M5459 Other low back pain: Secondary | ICD-10-CM

## 2023-11-05 DIAGNOSIS — R102 Pelvic and perineal pain: Secondary | ICD-10-CM

## 2023-11-05 DIAGNOSIS — R279 Unspecified lack of coordination: Secondary | ICD-10-CM

## 2023-11-05 NOTE — Therapy (Signed)
 OUTPATIENT PHYSICAL THERAPY TREATMENT NOTE   Patient Name: Katelyn [name] MRN: [id number] DOB:1976-03-14, 48 y.o., female Today's Date: 11/05/2023  PCP: Alphonsa Glendia LABOR, MD  REFERRING PROVIDER: Jeralyn Crutch, MD   END OF SESSION:   PT End of Session - 11/05/23 0805     Visit Number 5    Date for PT Re-Evaluation 11/26/23    Authorization Type HTA    PT Start Time 0800    PT Stop Time 0839    PT Time Calculation (min) 39 min    Activity Tolerance Patient tolerated treatment well    Behavior During Therapy [facility] for tasks assessed/performed                  Past Medical History:  Diagnosis Date   Anemia    Anorexia    Anxiety    on meds   Asthma    controlled with meds   Benign juvenile melanoma    Chronic headaches    Colon polyps    found on colonoscopy 04/26/2012   Complication of anesthesia    itching after epidural for c section   Constipation    Depression    on meds   Depression    several suicide attempts, hospitaluzed in 2012 for this; hx pf ECT treatments ; pt sees Dr. Curry pyschiatrist  and doing well on medication   Dysrhythmia    hx of PVC's (pt hasn't seen cardiologist since 2021- no follow up needed)   GERD (gastroesophageal reflux disease)    Headache    Heart murmur    as a child - no one mentions hearing murmur anymore, sometimes it is heard. Echo has been performed 02/03/18   Heartburn    no meds   History of pneumonia    x 3  years ago - no recent problems   Hx of blood clots 2019   bilateral LE   Hypertension    Left renal atrophy 12/19/2021   LEFT   Neuromuscular disorder (HCC)    small fiber neuropathy   Obesity    Pneumonia    Polycystic ovary    takes metformin  to treat   Skin lesion    Excisional biopsy of moles - none cancerous   Sleep apnea    yrs ago - diagnosed mild sleep apnea - did not have to use cpap    Past Surgical History:  Procedure Laterality Date   BREAST CAPSULECTOMY Bilateral 12/13/2020    1 week later pt had hematoma surgery on left breast   BREAST ENHANCEMENT SURGERY Bilateral 02/11/2017   BREATH TEK H PYLORI N/A 07/22/2013   Procedure: BREATH TEK H PYLORI;  Surgeon: Morene ONEIDA Olives, MD;  Location: [facility];  Service: General;  Laterality: N/A;   CESAREAN SECTION  2004, 2007   x 2   COLONOSCOPY  2013   COLONOSCOPY  2021   MS-F/V-sutab (good)-[facility]   CYSTOSCOPY WITH URETHRAL DILATATION  age 37    GASTRIC ROUX-EN-Y N/A 10/31/2013   Procedure: LAPAROSCOPIC ROUX-EN-Y GASTRIC BYPASS WITH UPPER ENDOSCOPY;  Surgeon: Morene ONEIDA Olives, MD;  Location: [facility];  Service: General;  Laterality: N/A;   GLUTEUS MINIMUS REPAIR Left 05/28/2021   Procedure: Left GLUTEUS Medius REPAIR;  Surgeon: Genelle Standing, MD;  Location: [facility] OR;  Service: Orthopedics;  Laterality: Left;   ingrown toenail Right    Ingrown nail on great right toe   kidney stent  08/28/2014   mole excision     benign juvenile melanoma removed  from left leg - inner thigh   POLYPECTOMY     2013   SKIN LESION EXCISION     back   TONSILLECTOMY  age 24   TUBAL LIGATION  2018   Patient Active Problem List   Diagnosis Date Noted   Essential hypertension 11/04/2022   Panic disorder (episodic paroxysmal anxiety) 07/10/2022   Major depressive disorder, recurrent, moderate (HCC) 07/10/2022   Left renal atrophy 12/19/2021   Stage 2 chronic kidney disease 11/20/2021   GERD (gastroesophageal reflux disease) 09/27/2021   Chronic pain syndrome 09/27/2021   Asthma 07/18/2020   Chronic myofascial pain 03/18/2018   Small fiber neuropathy 03/18/2018   Idiopathic small fiber sensory neuropathy 02/23/2018   Varicose veins of both lower extremities 01/13/2018   Cervical spondylosis without myelopathy 08/28/2017   Migraine with aura and without status migrainosus, not intractable 07/22/2017   Lumbar degenerative disc disease 07/20/2017   Spondylosis of lumbar spine 07/20/2017   Coccydynia 03/12/2017   Bariatric surgery  status 05/02/2016   Tachy-brady syndrome (HCC) 12/19/2015   Iron  deficiency anemia 06/10/2015   Sleep disorder 06/01/2013   Generalized anxiety disorder 05/30/2013   Depression, major, recurrent, severe with psychosis (HCC) 06/17/2012   Eating disorder 12/18/2011   Constipation 01/20/2011   Polycystic ovarian syndrome 09/01/2008    REFERRING DIAG: R10.2,G89.29 (ICD-10-CM) - Chronic pelvic pain in female  THERAPY DIAG:  Muscle weakness (generalized)  Other muscle spasm  Unspecified lack of coordination  Abnormal posture  Other low back pain  Pelvic pain  Pain in left hip  Rationale for Evaluation and Treatment Rehabilitation  PERTINENT HISTORY: Breast augmentation surgeries with follow-up reduction with complication, c/s X 2, gastric bypass, bil tubal ligation, long history of chronic low back/pelvic pain, pudendal neuralgia, coccydynia, Lt glute med repair, Lt ovarian cyst, pelvic tumor, small fiber neuropathy, Lt hip OA   PRECAUTIONS: extreme tachycardia, no standing exercise - monitor heart rate, pelvic tumor without biopsy, possible cancer? No dx at this point - just watching blood levels   SUBJECTIVE:                                                                                                                                                                                      SUBJECTIVE STATEMENT:   Pt reports significant improvement in pain after last session and she could more easily lift bil LE to help get in and out of car.  Problem list: -pudendal neuroalgia/intermittent labial pain -Bil hip and low back pain -being unable to sit -deconditioning -urinary urgency/leaking -constipation  -dyspareunia    PAIN: 11/05/23 PAIN:  Are you having pain? Yes NPRS scale: 7/10 Pain location: vagina/labia, low  back, hips, lower abdomen Pain orientation: Bilateral  PAIN TYPE: aching, burning, sharp, throbbing, tight, and tingling Pain description: constant   Aggravating factors: not exercising Relieving factors: alternating sides, lying on stomach, small amounts of standing     PRECAUTIONS: None   WEIGHT BEARING RESTRICTIONS: No   FALLS:  Has patient fallen in last 6 months? No   LIVING ENVIRONMENT: Lives with: lives with their family Lives in: House/apartment   OCCUPATION: disabled   PLOF: Independent   PATIENT GOALS: would like to be able to sit; decrease bil hip/low back pain    BOWEL MOVEMENT: Pain with bowel movement: No Type of bowel movement:Frequency 1x/day to every other day and Strain Yes Fully empty rectum: No Leakage: No Pads: No Fiber supplement: No no medication to help stimulate bowel movements   URINATION: Pain with urination: No Fully empty bladder: No Stream:  trouble starting the stream - worse at night Urgency: yes Frequency: every 30 minutes to an hour Leakage:  Laugh, cough, sneeze Pads: No due to irritation    INTERCOURSE: Pain with intercourse: Initial Penetration and During Penetration Ability to have vaginal penetration:  Yes: pain  Climax: yes Marinoff Scale: 2/3   PREGNANCY: Vaginal deliveries 0 Tearing No C-section deliveries 2 Currently pregnant No       OBJECTIVE:   09/03/23:  PATIENT SURVEYS:   PFIQ-7: 100  COGNITION: Overall cognitive status: Within functional limits for tasks assessed     SENSATION: Light touch: Appears intact  FUNCTIONAL TESTS:  Single leg stance:  Rt: pelvic drop with UE support  Lt: pelvic drop with UE support  5xSTS: 36 seconds  GAIT: Assistive device utilized: None Comments: WNL  POSTURE: No Significant postural limitations   LUMBARAROM/PROM:  A/PROM A/PROM  Eval (% available)  Flexion 25  Extension 20  Right lateral flexion 50  Left lateral flexion 50  Right rotation 50  Left rotation 50   (Blank rows = not tested)  PALPATION:   General: lumbar paraspinal and oblique muscle restriction  Pelvic Alignment: Lt iliac crest  elevation, significant Rt posterior rotation   Abdominal: lower abdominal tenderness                 External Perineal Exam: redness and irritation equal bil                             Internal Pelvic Floor: sharp, tender, pain throughout superficial muscles; achy and restricted in bil levator ani with more pain on the Rt compared to the Lt; dryness; paradoxical pelvic floor bearing down  Patient confirms identification and approves PT to assess internal pelvic floor and treatment Yes  PELVIC MMT:   MMT eval  Vaginal 1/5, 5 second hold, 2 repeat contractions - very difficult time with coordination  (Blank rows = not tested)        TONE: High with pain  PROLAPSE: None noted today; she did have paradoxical bearing down contraction so may not have been able to detect     TODAY'S TREATMENT  11/05/23 Manual: Myofascial release to thoracic and lumbar spine and paraspinals Prone rhythmic rotation of bil LE 20x bil Sacral and L1-5 PA mobilizations grade 1 Soft tissue mobilization to Rt lateral coccyx border and piriformis   Exercises: Prone P/ROM to bil hips focusing on internal and external rotation in hip flexion   11/03/23 Manual: Myofascial release to thoracic and lumbar spine and paraspinals Prone rhythmic rotation of bil  LE 20x bil Sacral and L1-5 PA mobilizations grade 1 Soft tissue mobilization to Rt lateral coccyx border and piriformis  Neuromuscular re-education: Prone glute sets 8 x 5 seconds  Prone hip internal/external rotation 2 x 10 Prone hip internal rotation isometrics with ball at ankles 8 x 5 seconds   09/28/23 Manual: Joint mobilization:   Mobilization of L4-S1 in prone grade 2 and 3 to work on movement  Mobilization of the sacrum to reduce the pain with sitting Myofascial release: Fascial release with one hand on sacrum and other on T6 area and give slight distraction going through the restrictions slowly in prone Craniosacral work to the skull to  improve the spinal fluid movement for pain relief of the lumbar Distraction of the thoracic spine, cervical and lumbar to decompress and reduce pain Fascial release of the side of the lumbar going through the restrictions.  Neuromuscular re-education: Form correction: Prone hip internal and external rotation with assistance to work on motion and mobility of the SI joint Down training: Diaphragmatic breathing in prone with filling up the back of the rib cage and sides with therapist giving tactile cues   PATIENT EDUCATION:  09/28/23 Education details: Access Code: [email or web address] Person educated: Patient Education method: Explanation, Demonstration, Tactile cues, Verbal cues, and Handouts Education comprehension: verbalized understanding   HOME EXERCISE PROGRAM: 09/28/23 Access Code: [email or web address] URL: [email or web address] Date: 09/28/2023 Prepared by: Channing Pereyra  Exercises - Prone Diaphragmatic Breathing  - 2 x daily - 7 x weekly - 1 sets - 10 reps - Prone Hip External Rotation AROM  - 1 x daily - 7 x weekly - 1 sets - 10 reps   ASSESSMENT:   CLINICAL IMPRESSION: Due to excellent tolerance to treatment last session, we continued these manual techniques to help improve functional ability. We worked more on P/ROM to bil hip into flexion, internal rotation, and external rotation with good tolerance. Believe we are seeing some good improvements in pain that will allow improved ability to perform gentle strengthening. She will continue to benefit from skilled PT intervention in order to decrease pain, improve functional ability specifically sitting tolerance, increase ease of starting stream/emptying bladder, decrease dyspareunia, and improve QOL.    OBJECTIVE IMPAIRMENTS: decreased activity tolerance, decreased coordination, decreased endurance, decreased mobility, decreased ROM, decreased strength, hypomobility, increased fascial restrictions, increased muscle spasms, impaired  flexibility, impaired tone, postural dysfunction, and pain.    ACTIVITY LIMITATIONS: bending, sitting, standing, stairs, transfers, bed mobility, and locomotion level, vaginal penetration   PARTICIPATION LIMITATIONS: cleaning, laundry, interpersonal relationship, driving, and community activity   PERSONAL FACTORS: 3+ comorbidities: Breast augmentation surgeries with follow-up reduction with complication, c/s X 2, gastric bypass, bil tubal ligation, long history of chronic low back/pelvic pain, pudendal neuralgia, coccydynia, Lt glute med repair, Lt ovarian cyst, pelvic tumor, small fiber neuropathy, Lt hip OA  are also affecting patient's functional outcome.    REHAB POTENTIAL: Good   CLINICAL DECISION MAKING: Stable/uncomplicated   EVALUATION COMPLEXITY: Low     GOALS: Goals reviewed with patient? Yes   SHORT TERM GOALS: Updated 11/03/23   Pt will be independent with HEP.    Baseline: Goal status: MET 11/03/23   2.  Pt will be independent with diaphragmatic breathing and down training activities in order to improve pelvic floor relaxation and abdominal wall mobility.   Baseline:  Goal status: IN PROGRESS 11/03/23   3.  Pt will report improve stream initiation and complete voiding with urination in order to decrease urinary leaking that causes  skin irritation. Baseline:  Goal status: IN PROGRESS 11/03/23              4.  Pt will increase all impaired lumbar A/ROM by 25% without pain in order to decrease pelvic pain and improve pelvic floor muscle length tension relationship.   Baseline:  Goal status: IN PROGRESS 11/03/23  5. Pt will stop straining with bowel movements to decrease strain on pelvic floor muscles in order to reduce pelvic pain, allowing her to sit for longer periods of time.   Baseline:  Goal status: IN PROGRESS 11/03/23   LONG TERM GOALS: Updated 11/03/23   Pt will be independent with advanced HEP.    Baseline:  Goal status:IN PROGRESS 11/03/23   2.  Pt will be able  to have intercourse with no greater than 5/10 pain in order to maintain intimate relationship with partner.  Baseline:  Goal status: IN PROGRESS 11/03/23   3.  Pt will be able to ascend/descend 12 steps at regular intervals throughout the day safely and with minimal difficulty in order to perform household chores.  Baseline:  Goal status: IN PROGRESS 11/03/23   4.  Pt will improve 5xSTS to less than 20 seconds and improved PFIQ by 25% in order to demonstrate improved QOL and functional ability.  Baseline:  Goal status: IN PROGRESS 11/03/23   5.  Patient will report no greater than 5/10 pain with sitting in order to more easily attend appointments, drive, and spend time with family.  Baseline:  Goal status: IN PROGRESS 11/03/23   6.  Pt will be able to sit for >30 minutes in order to allow heart rate to remain lower and perform seated exercise.    Baseline:  Goal status: IN PROGRESS 11/03/23  7. Pt will be able to stand on one leg in order to put on pants on without pain or difficulty.    Baseline: unable/causing severe pain the rest of the day Goal status: IN PROGRESS 11/03/23   PLAN:   PT FREQUENCY: 1x/week   PT DURATION: 12 weeks   PLANNED INTERVENTIONS: Therapeutic exercises, Therapeutic activity, Neuromuscular re-education, Balance training, Gait training, Patient/Family education, Self Care, Joint mobilization, Aquatic Therapy, Dry Needling, Biofeedback, and Manual therapy   PLAN FOR NEXT SESSION: Internal manual techniques to help improve pelvic floor muscle spasm and pain. Work on pelvic alignment. Seems to be a candidate for cranio sacral work, she responds to fascial work, worked around the uterus to reduce restrictions    [employer], PT, DPT07/10/258:41 AM

## 2023-11-07 ENCOUNTER — Other Ambulatory Visit: Payer: Self-pay | Admitting: Family Medicine

## 2023-11-07 MED ORDER — KETOCONAZOLE 2 % EX CREA: 1.0000 | TOPICAL_CREAM | Freq: Two times a day (BID) | CUTANEOUS | 4 refills | Status: AC

## 2023-11-07 MED ORDER — GRISEOFULVIN MICROSIZE 500 MG PO TABS: ORAL_TABLET | ORAL | 0 refills | Status: AC

## 2023-11-09 DIAGNOSIS — F411 Generalized anxiety disorder: Secondary | ICD-10-CM | POA: Diagnosis not present

## 2023-11-09 DIAGNOSIS — F410 Panic disorder [episodic paroxysmal anxiety] without agoraphobia: Secondary | ICD-10-CM | POA: Diagnosis not present

## 2023-11-09 DIAGNOSIS — F331 Major depressive disorder, recurrent, moderate: Secondary | ICD-10-CM | POA: Diagnosis not present

## 2023-11-12 ENCOUNTER — Ambulatory Visit

## 2023-11-13 DIAGNOSIS — F331 Major depressive disorder, recurrent, moderate: Secondary | ICD-10-CM | POA: Diagnosis not present

## 2023-11-13 DIAGNOSIS — F411 Generalized anxiety disorder: Secondary | ICD-10-CM | POA: Diagnosis not present

## 2023-11-16 DIAGNOSIS — R635 Abnormal weight gain: Secondary | ICD-10-CM | POA: Diagnosis not present

## 2023-11-16 DIAGNOSIS — N261 Atrophy of kidney (terminal): Secondary | ICD-10-CM | POA: Diagnosis not present

## 2023-11-16 DIAGNOSIS — N182 Chronic kidney disease, stage 2 (mild): Secondary | ICD-10-CM | POA: Diagnosis not present

## 2023-11-16 DIAGNOSIS — I129 Hypertensive chronic kidney disease with stage 1 through stage 4 chronic kidney disease, or unspecified chronic kidney disease: Secondary | ICD-10-CM | POA: Diagnosis not present

## 2023-11-16 DIAGNOSIS — R809 Proteinuria, unspecified: Secondary | ICD-10-CM | POA: Diagnosis not present

## 2023-11-16 DIAGNOSIS — G629 Polyneuropathy, unspecified: Secondary | ICD-10-CM | POA: Diagnosis not present

## 2023-11-17 ENCOUNTER — Ambulatory Visit

## 2023-11-19 ENCOUNTER — Ambulatory Visit

## 2023-11-19 DIAGNOSIS — M5459 Other low back pain: Secondary | ICD-10-CM

## 2023-11-19 DIAGNOSIS — R102 Pelvic and perineal pain: Secondary | ICD-10-CM

## 2023-11-19 DIAGNOSIS — M25552 Pain in left hip: Secondary | ICD-10-CM

## 2023-11-19 DIAGNOSIS — M6281 Muscle weakness (generalized): Secondary | ICD-10-CM | POA: Diagnosis not present

## 2023-11-19 DIAGNOSIS — R279 Unspecified lack of coordination: Secondary | ICD-10-CM

## 2023-11-19 DIAGNOSIS — M62838 Other muscle spasm: Secondary | ICD-10-CM

## 2023-11-19 DIAGNOSIS — R293 Abnormal posture: Secondary | ICD-10-CM

## 2023-11-19 NOTE — Therapy (Signed)
 OUTPATIENT PHYSICAL THERAPY TREATMENT NOTE   Patient Name: Katelyn [name] MRN: [id number] DOB:1975/09/15, 48 y.o., female Today's Date: 11/19/2023  PCP: Alphonsa Glendia LABOR, MD  REFERRING PROVIDER: Jeralyn Crutch, MD   END OF SESSION:   PT End of Session - 11/19/23 9196     Visit Number 6    Date for PT Re-Evaluation 11/26/23    Authorization Type HTA    Progress Note Due on Visit 10    PT Start Time 0800    PT Stop Time 0842    PT Time Calculation (min) 42 min    Activity Tolerance Patient tolerated treatment well    Behavior During Therapy [facility] for tasks assessed/performed                  Past Medical History:  Diagnosis Date   Anemia    Anorexia    Anxiety    on meds   Asthma    controlled with meds   Benign juvenile melanoma    Chronic headaches    Colon polyps    found on colonoscopy 04/26/2012   Complication of anesthesia    itching after epidural for c section   Constipation    Depression    on meds   Depression    several suicide attempts, hospitaluzed in 2012 for this; hx pf ECT treatments ; pt sees Dr. Curry pyschiatrist  and doing well on medication   Dysrhythmia    hx of PVC's (pt hasn't seen cardiologist since 2021- no follow up needed)   GERD (gastroesophageal reflux disease)    Headache    Heart murmur    as a child - no one mentions hearing murmur anymore, sometimes it is heard. Echo has been performed 02/03/18   Heartburn    no meds   History of pneumonia    x 3  years ago - no recent problems   Hx of blood clots 2019   bilateral LE   Hypertension    Left renal atrophy 12/19/2021   LEFT   Neuromuscular disorder (HCC)    small fiber neuropathy   Obesity    Pneumonia    Polycystic ovary    takes metformin  to treat   Skin lesion    Excisional biopsy of moles - none cancerous   Sleep apnea    yrs ago - diagnosed mild sleep apnea - did not have to use cpap    Past Surgical History:  Procedure Laterality Date   BREAST  CAPSULECTOMY Bilateral 12/13/2020   1 week later pt had hematoma surgery on left breast   BREAST ENHANCEMENT SURGERY Bilateral 02/11/2017   BREATH TEK H PYLORI N/A 07/22/2013   Procedure: BREATH TEK H PYLORI;  Surgeon: Morene ONEIDA Olives, MD;  Location: [facility];  Service: General;  Laterality: N/A;   CESAREAN SECTION  2004, 2007   x 2   COLONOSCOPY  2013   COLONOSCOPY  2021   MS-F/V-sutab (good)-[facility]   CYSTOSCOPY WITH URETHRAL DILATATION  age 2    GASTRIC ROUX-EN-Y N/A 10/31/2013   Procedure: LAPAROSCOPIC ROUX-EN-Y GASTRIC BYPASS WITH UPPER ENDOSCOPY;  Surgeon: Morene ONEIDA Olives, MD;  Location: [facility];  Service: General;  Laterality: N/A;   GLUTEUS MINIMUS REPAIR Left 05/28/2021   Procedure: Left GLUTEUS Medius REPAIR;  Surgeon: Genelle Standing, MD;  Location: [facility] OR;  Service: Orthopedics;  Laterality: Left;   ingrown toenail Right    Ingrown nail on great right toe   kidney stent  08/28/2014   mole  excision     benign juvenile melanoma removed from left leg - inner thigh   POLYPECTOMY     2013   SKIN LESION EXCISION     back   TONSILLECTOMY  age 66   TUBAL LIGATION  2018   Patient Active Problem List   Diagnosis Date Noted   Essential hypertension 11/04/2022   Panic disorder (episodic paroxysmal anxiety) 07/10/2022   Major depressive disorder, recurrent, moderate (HCC) 07/10/2022   Left renal atrophy 12/19/2021   Stage 2 chronic kidney disease 11/20/2021   GERD (gastroesophageal reflux disease) 09/27/2021   Chronic pain syndrome 09/27/2021   Asthma 07/18/2020   Chronic myofascial pain 03/18/2018   Small fiber neuropathy 03/18/2018   Idiopathic small fiber sensory neuropathy 02/23/2018   Varicose veins of both lower extremities 01/13/2018   Cervical spondylosis without myelopathy 08/28/2017   Migraine with aura and without status migrainosus, not intractable 07/22/2017   Lumbar degenerative disc disease 07/20/2017   Spondylosis of lumbar spine 07/20/2017    Coccydynia 03/12/2017   Bariatric surgery status 05/02/2016   Tachy-brady syndrome (HCC) 12/19/2015   Iron  deficiency anemia 06/10/2015   Sleep disorder 06/01/2013   Generalized anxiety disorder 05/30/2013   Depression, major, recurrent, severe with psychosis (HCC) 06/17/2012   Eating disorder 12/18/2011   Constipation 01/20/2011   Polycystic ovarian syndrome 09/01/2008    REFERRING DIAG: R10.2,G89.29 (ICD-10-CM) - Chronic pelvic pain in female  THERAPY DIAG:  Muscle weakness (generalized)  Other muscle spasm  Unspecified lack of coordination  Abnormal posture  Other low back pain  Pelvic pain  Pain in left hip  Rationale for Evaluation and Treatment Rehabilitation  PERTINENT HISTORY: Breast augmentation surgeries with follow-up reduction with complication, c/s X 2, gastric bypass, bil tubal ligation, long history of chronic low back/pelvic pain, pudendal neuralgia, coccydynia, Lt glute med repair, Lt ovarian cyst, pelvic tumor, small fiber neuropathy, Lt hip OA   PRECAUTIONS: extreme tachycardia, no standing exercise - monitor heart rate, pelvic tumor without biopsy, possible cancer? No dx at this point - just watching blood levels   SUBJECTIVE:                                                                                                                                                                                      SUBJECTIVE STATEMENT:   Pt would like to schedule 1x/week moving forward. She feels like we are making progress with stretches and manual work on her back and hips.   Problem list: -pudendal neuroalgia/intermittent labial pain -Bil hip and low back pain -being unable to sit -deconditioning -urinary urgency/leaking -constipation  -dyspareunia    PAIN: 11/05/23 PAIN:  Are you  having pain? Yes NPRS scale: 7/10 Pain location: vagina/labia, low back, hips, lower abdomen Pain orientation: Bilateral  PAIN TYPE: aching, burning, sharp, throbbing,  tight, and tingling Pain description: constant  Aggravating factors: not exercising Relieving factors: alternating sides, lying on stomach, small amounts of standing     PRECAUTIONS: None   WEIGHT BEARING RESTRICTIONS: No   FALLS:  Has patient fallen in last 6 months? No   LIVING ENVIRONMENT: Lives with: lives with their family Lives in: House/apartment   OCCUPATION: disabled   PLOF: Independent   PATIENT GOALS: would like to be able to sit; decrease bil hip/low back pain    BOWEL MOVEMENT: Pain with bowel movement: No Type of bowel movement:Frequency 1x/day to every other day and Strain Yes Fully empty rectum: No Leakage: No Pads: No Fiber supplement: No no medication to help stimulate bowel movements   URINATION: Pain with urination: No Fully empty bladder: No Stream:  trouble starting the stream - worse at night Urgency: yes Frequency: every 30 minutes to an hour Leakage:  Laugh, cough, sneeze Pads: No due to irritation    INTERCOURSE: Pain with intercourse: Initial Penetration and During Penetration Ability to have vaginal penetration:  Yes: pain  Climax: yes Marinoff Scale: 2/3   PREGNANCY: Vaginal deliveries 0 Tearing No C-section deliveries 2 Currently pregnant No       OBJECTIVE:   09/03/23:  PATIENT SURVEYS:   PFIQ-7: 100  COGNITION: Overall cognitive status: Within functional limits for tasks assessed     SENSATION: Light touch: Appears intact  FUNCTIONAL TESTS:  Single leg stance:  Rt: pelvic drop with UE support  Lt: pelvic drop with UE support  5xSTS: 36 seconds  GAIT: Assistive device utilized: None Comments: WNL  POSTURE: No Significant postural limitations   LUMBARAROM/PROM:  A/PROM A/PROM  Eval (% available)  Flexion 25  Extension 20  Right lateral flexion 50  Left lateral flexion 50  Right rotation 50  Left rotation 50   (Blank rows = not tested)  PALPATION:   General: lumbar paraspinal and oblique muscle  restriction  Pelvic Alignment: Lt iliac crest elevation, significant Rt posterior rotation   Abdominal: lower abdominal tenderness                 External Perineal Exam: redness and irritation equal bil                             Internal Pelvic Floor: sharp, tender, pain throughout superficial muscles; achy and restricted in bil levator ani with more pain on the Rt compared to the Lt; dryness; paradoxical pelvic floor bearing down  Patient confirms identification and approves PT to assess internal pelvic floor and treatment Yes  PELVIC MMT:   MMT eval  Vaginal 1/5, 5 second hold, 2 repeat contractions - very difficult time with coordination  (Blank rows = not tested)        TONE: High with pain  PROLAPSE: None noted today; she did have paradoxical bearing down contraction so may not have been able to detect     TODAY'S TREATMENT  11/19/23 Manual: Myofascial release to thoracic and lumbar spine and paraspinals Prone rhythmic rotation of bil LE 20x bil Sacral and L1-5 PA mobilizations grade 1 Soft tissue mobilization to Rt lateral coccyx border and piriformis  Exercises: Prone P/ROM to bil hips focusing on internal and external rotation in hip flexion; included bil hip flexion today as well Prone: Glute  set: 5 x 10 second holds Hip IR set: 5 x 10 second holds ball squeeze at ankles Hip ER set: 5 x 10 second holds out against red band with knee flexion Hip abduction set: 5 x 10 second holds out against red band with straight LE   11/05/23 Manual: Myofascial release to thoracic and lumbar spine and paraspinals Prone rhythmic rotation of bil LE 20x bil Sacral and L1-5 PA mobilizations grade 1 Soft tissue mobilization to Rt lateral coccyx border and piriformis   Exercises: Prone P/ROM to bil hips focusing on internal and external rotation in hip flexion   11/03/23 Manual: Myofascial release to thoracic and lumbar spine and paraspinals Prone rhythmic rotation of bil  LE 20x bil Sacral and L1-5 PA mobilizations grade 1 Soft tissue mobilization to Rt lateral coccyx border and piriformis  Neuromuscular re-education: Prone glute sets 8 x 5 seconds  Prone hip internal/external rotation 2 x 10 Prone hip internal rotation isometrics with ball at ankles 8 x 5 seconds    PATIENT EDUCATION:  09/28/23 Education details: Access Code: [email or web address] Person educated: Patient Education method: Explanation, Demonstration, Tactile cues, Verbal cues, and Handouts Education comprehension: verbalized understanding   HOME EXERCISE PROGRAM: 09/28/23 Access Code: [email or web address] URL: [email or web address] Date: 09/28/2023 Prepared by: Channing Pereyra  Exercises - Prone Diaphragmatic Breathing  - 2 x daily - 7 x weekly - 1 sets - 10 reps - Prone Hip External Rotation AROM  - 1 x daily - 7 x weekly - 1 sets - 10 reps   ASSESSMENT:   CLINICAL IMPRESSION: Pt is seeing progress with pain and her functional ability. She feels like she is able to take step into the house without as much UE support. She is also able to get in and out of the car a little easier. She still cannot put pants on with balance in standing position. We continued manual techniques and stretches that have been very helpful in improving her functional ability. She was able to perform gentle isometrics in prone with good tolerance, but did have some increase in Lt hip pain at tensor fascia latte location during hip abduction. She reported no increase in pain at end of session. She will continue to benefit from skilled PT intervention in order to decrease pain, improve functional ability specifically sitting tolerance, increase ease of starting stream/emptying bladder, decrease dyspareunia, and improve QOL.    OBJECTIVE IMPAIRMENTS: decreased activity tolerance, decreased coordination, decreased endurance, decreased mobility, decreased ROM, decreased strength, hypomobility, increased fascial restrictions,  increased muscle spasms, impaired flexibility, impaired tone, postural dysfunction, and pain.    ACTIVITY LIMITATIONS: bending, sitting, standing, stairs, transfers, bed mobility, and locomotion level, vaginal penetration   PARTICIPATION LIMITATIONS: cleaning, laundry, interpersonal relationship, driving, and community activity   PERSONAL FACTORS: 3+ comorbidities: Breast augmentation surgeries with follow-up reduction with complication, c/s X 2, gastric bypass, bil tubal ligation, long history of chronic low back/pelvic pain, pudendal neuralgia, coccydynia, Lt glute med repair, Lt ovarian cyst, pelvic tumor, small fiber neuropathy, Lt hip OA  are also affecting patient's functional outcome.    REHAB POTENTIAL: Good   CLINICAL DECISION MAKING: Stable/uncomplicated   EVALUATION COMPLEXITY: Low     GOALS: Goals reviewed with patient? Yes   SHORT TERM GOALS: Updated 11/03/23   Pt will be independent with HEP.    Baseline: Goal status: MET 11/03/23   2.  Pt will be independent with diaphragmatic breathing and down training activities in order to improve pelvic floor relaxation and abdominal wall mobility.  Baseline:  Goal status: IN PROGRESS 11/03/23   3.  Pt will report improve stream initiation and complete voiding with urination in order to decrease urinary leaking that causes skin irritation. Baseline:  Goal status: IN PROGRESS 11/03/23              4.  Pt will increase all impaired lumbar A/ROM by 25% without pain in order to decrease pelvic pain and improve pelvic floor muscle length tension relationship.   Baseline:  Goal status: IN PROGRESS 11/03/23  5. Pt will stop straining with bowel movements to decrease strain on pelvic floor muscles in order to reduce pelvic pain, allowing her to sit for longer periods of time.   Baseline:  Goal status: IN PROGRESS 11/03/23   LONG TERM GOALS: Updated 11/03/23   Pt will be independent with advanced HEP.    Baseline:  Goal status:IN  PROGRESS 11/03/23   2.  Pt will be able to have intercourse with no greater than 5/10 pain in order to maintain intimate relationship with partner.  Baseline:  Goal status: IN PROGRESS 11/03/23   3.  Pt will be able to ascend/descend 12 steps at regular intervals throughout the day safely and with minimal difficulty in order to perform household chores.  Baseline:  Goal status: IN PROGRESS 11/03/23   4.  Pt will improve 5xSTS to less than 20 seconds and improved PFIQ by 25% in order to demonstrate improved QOL and functional ability.  Baseline:  Goal status: IN PROGRESS 11/03/23   5.  Patient will report no greater than 5/10 pain with sitting in order to more easily attend appointments, drive, and spend time with family.  Baseline:  Goal status: IN PROGRESS 11/03/23   6.  Pt will be able to sit for >30 minutes in order to allow heart rate to remain lower and perform seated exercise.    Baseline:  Goal status: IN PROGRESS 11/03/23  7. Pt will be able to stand on one leg in order to put on pants on without pain or difficulty.    Baseline: unable/causing severe pain the rest of the day Goal status: IN PROGRESS 11/03/23   PLAN:   PT FREQUENCY: 1x/week   PT DURATION: 12 weeks   PLANNED INTERVENTIONS: Therapeutic exercises, Therapeutic activity, Neuromuscular re-education, Balance training, Gait training, Patient/Family education, Self Care, Joint mobilization, Aquatic Therapy, Dry Needling, Biofeedback, and Manual therapy   PLAN FOR NEXT SESSION: Internal manual techniques to help improve pelvic floor muscle spasm and pain. Work on pelvic alignment. Seems to be a candidate for cranio sacral work, she responds to fascial work, worked around the uterus to reduce restrictions    [employer], PT, DPT07/24/258:40 AM

## 2023-11-22 NOTE — Progress Notes (Unsigned)
  Electrophysiology Office Note:   Date:  11/22/2023  ID:  Katelyn [name], DOB 28-Mar-1976, MRN [id number]  Primary Cardiologist: Maude Emmer, MD Electrophysiologist: None    {Click to update primary MD,subspecialty MD or APP then REFRESH:1}    History of Present Illness:   Katelyn [name] is a 48 y.o. female with h/o PVcs, HTNs, chronic and worsening small fiber neuropathy, and dysautonomia seen today for routine electrophysiology followup.   Since last being seen in our clinic the patient reports doing ***.  she denies chest pain, palpitations, dyspnea, PND, orthopnea, nausea, vomiting, dizziness, syncope, edema, weight gain, or early satiety.   Review of systems complete and found to be negative unless listed in HPI.   EP Information / Studies Reviewed:    EKG is not ordered today. EKG from 08/24/2023 reviewed which showed Sinus tachcyardia at 105 bpm       Arrhythmia/Device History No specialty comments available.   Physical Exam:   VS:  There were no vitals taken for this visit.   Wt Readings from Last 3 Encounters:  09/30/23 267 lb 3.2 oz (121.2 kg)  08/24/23 260 lb (117.9 kg)  07/27/23 245 lb (111.1 kg)     GEN: No acute distress NECK: No JVD; No carotid bruits CARDIAC: {EPRHYTHM:28826}, no murmurs, rubs, gallops RESPIRATORY:  Clear to auscultation without rales, wheezing or rhonchi  ABDOMEN: Soft, non-tender, non-distended EXTREMITIES:  {EDEMA LEVEL:28147::No} edema; No deformity   ASSESSMENT AND PLAN:    Autonomic Dysfunction We discussed the role of salt and water repletion, the importance of exercise, often needing to be started in the recumbent position, and the awareness of triggers and the role of ambient heat and dehydration.  Discussed salt substitutes with a  goal of 2-3 gms of Sodium or 5-7 gm of sodium Chloride  daily.  Rehydration solutions include Liquid IV, NUUN, TriOral, Normralyte pedialyte advanced care and Banana Bags.  Salt tablets include  plain salt tablets, SaltStick Vitassium, thermatabs amongst others.   Salt supplements are best used with adjunctive sugar  Discussed the importance of compression wear, specifically thigh or abdominal compression. This can be accomplished separately or with combined thigh/pelvic/abdominal compression wear such as Spanx.    HTN Stable on current regimen   {Click here to Review PMH, Prob List, Meds, Allergies, SHx, FHx  :1}   Follow up with {EPMDS:28135::EP Team} {EPFOLLOW UP:28173}  Signed, Ozell Prentice Passey, PA-C

## 2023-11-23 ENCOUNTER — Ambulatory Visit: Attending: Student | Admitting: Student

## 2023-11-23 ENCOUNTER — Ambulatory Visit: Admitting: Physical Therapy

## 2023-11-23 ENCOUNTER — Encounter: Payer: Self-pay | Admitting: Student

## 2023-11-23 VITALS — BP 126/84 | HR 128 | Ht 65.0 in | Wt 274.1 lb

## 2023-11-23 DIAGNOSIS — M7989 Other specified soft tissue disorders: Secondary | ICD-10-CM | POA: Diagnosis not present

## 2023-11-23 DIAGNOSIS — I10 Essential (primary) hypertension: Secondary | ICD-10-CM | POA: Diagnosis not present

## 2023-11-23 DIAGNOSIS — G901 Familial dysautonomia [Riley-Day]: Secondary | ICD-10-CM

## 2023-11-23 DIAGNOSIS — M79606 Pain in leg, unspecified: Secondary | ICD-10-CM

## 2023-11-23 DIAGNOSIS — G909 Disorder of the autonomic nervous system, unspecified: Secondary | ICD-10-CM

## 2023-11-23 MED ORDER — DILTIAZEM HCL 30 MG PO TABS: 30.0000 mg | ORAL_TABLET | ORAL | 3 refills | Status: AC | PRN

## 2023-11-23 NOTE — Patient Instructions (Addendum)
 Medication Instructions:  Start diltiazem  30 mg as needed for breakthrough tachycardia *If you need a refill on your cardiac medications before your next appointment, please call your pharmacy*  Lab Work: None ordered If you have labs (blood work) drawn today and your tests are completely normal, you will receive your results only by: MyChart Message (if you have MyChart) OR A paper copy in the mail If you have any lab test that is abnormal or we need to change your treatment, we will call you to review the results.  Testing/Procedures: Your provider has recommended that you have a lower extremity venous duplex to rule out DVT.  Your physician has requested that you have an ankle brachial index (ABI). During this test an ultrasound and blood pressure cuff are used to evaluate the arteries that supply the arms and legs with blood. Allow thirty minutes for this exam. There are no restrictions or special instructions.  Please note: We ask at that you not bring children with you during ultrasound (echo/ vascular) testing. Due to room size and safety concerns, children are not allowed in the ultrasound rooms during exams. Our front office staff cannot provide observation of children in our lobby area while testing is being conducted. An adult accompanying a patient to their appointment will only be allowed in the ultrasound room at the discretion of the ultrasound technician under special circumstances. We apologize for any inconvenience.  Follow-Up: At [facility], you and your health needs are our priority.  As part of our continuing mission to provide you with exceptional heart care, our providers are all part of one team.  This team includes your primary Cardiologist (physician) and Advanced Practice Providers or APPs (Physician Assistants and Nurse Practitioners) who all work together to provide you with the care you need, when you need it.  Your next appointment:   3-4  month(s)  Provider:   Soyla Norton, MD

## 2023-11-24 DIAGNOSIS — R809 Proteinuria, unspecified: Secondary | ICD-10-CM | POA: Diagnosis not present

## 2023-11-24 DIAGNOSIS — R635 Abnormal weight gain: Secondary | ICD-10-CM | POA: Diagnosis not present

## 2023-11-24 DIAGNOSIS — N182 Chronic kidney disease, stage 2 (mild): Secondary | ICD-10-CM | POA: Diagnosis not present

## 2023-11-25 ENCOUNTER — Ambulatory Visit

## 2023-11-25 DIAGNOSIS — M5459 Other low back pain: Secondary | ICD-10-CM

## 2023-11-25 DIAGNOSIS — M6281 Muscle weakness (generalized): Secondary | ICD-10-CM

## 2023-11-25 DIAGNOSIS — R102 Pelvic and perineal pain: Secondary | ICD-10-CM

## 2023-11-25 DIAGNOSIS — M25552 Pain in left hip: Secondary | ICD-10-CM

## 2023-11-25 DIAGNOSIS — R293 Abnormal posture: Secondary | ICD-10-CM

## 2023-11-25 DIAGNOSIS — M62838 Other muscle spasm: Secondary | ICD-10-CM

## 2023-11-25 DIAGNOSIS — R279 Unspecified lack of coordination: Secondary | ICD-10-CM

## 2023-11-25 NOTE — Telephone Encounter (Signed)
 Called pt directly to discuss normal findings of cMRI 07/2023 and that there are no concerns for a current or prior MI.  She was thankful for the call.

## 2023-11-25 NOTE — Therapy (Signed)
 OUTPATIENT PHYSICAL THERAPY TREATMENT NOTE   Patient Name: Katelyn [name] MRN: [id number] DOB:November 01, 1975, 48 y.o., female Today's Date: 11/25/2023  PCP: Alphonsa Glendia LABOR, MD  REFERRING PROVIDER: Jeralyn Crutch, MD   END OF SESSION:   PT End of Session - 11/25/23 1007     Visit Number 7    Date for PT Re-Evaluation 02/17/24    Authorization Type HTA    Progress Note Due on Visit 10    PT Start Time 0929    PT Stop Time 1010    PT Time Calculation (min) 41 min    Activity Tolerance Patient tolerated treatment well    Behavior During Therapy [facility] for tasks assessed/performed                   Past Medical History:  Diagnosis Date   Anemia    Anorexia    Anxiety    on meds   Asthma    controlled with meds   Benign juvenile melanoma    Chronic headaches    Colon polyps    found on colonoscopy 04/26/2012   Complication of anesthesia    itching after epidural for c section   Constipation    Depression    on meds   Depression    several suicide attempts, hospitaluzed in 2012 for this; hx pf ECT treatments ; pt sees Dr. Arfeen pyschiatrist  and doing well on medication   Dysrhythmia    hx of PVC's (pt hasn't seen cardiologist since 2021- no follow up needed)   GERD (gastroesophageal reflux disease)    Headache    Heart murmur    as a child - no one mentions hearing murmur anymore, sometimes it is heard. Echo has been performed 02/03/18   Heartburn    no meds   History of pneumonia    x 3  years ago - no recent problems   Hx of blood clots 2019   bilateral LE   Hypertension    Left renal atrophy 12/19/2021   LEFT   Neuromuscular disorder (HCC)    small fiber neuropathy   Obesity    Pneumonia    Polycystic ovary    takes metformin  to treat   Skin lesion    Excisional biopsy of moles - none cancerous   Sleep apnea    yrs ago - diagnosed mild sleep apnea - did not have to use cpap    Past Surgical History:  Procedure Laterality Date   BREAST  CAPSULECTOMY Bilateral 12/13/2020   1 week later pt had hematoma surgery on left breast   BREAST ENHANCEMENT SURGERY Bilateral 02/11/2017   BREATH TEK H PYLORI N/A 07/22/2013   Procedure: BREATH TEK H PYLORI;  Surgeon: Morene ONEIDA Olives, MD;  Location: [facility];  Service: General;  Laterality: N/A;   CESAREAN SECTION  2004, 2007   x 2   COLONOSCOPY  2013   COLONOSCOPY  2021   MS-F/V-sutab (good)-[facility]   CYSTOSCOPY WITH URETHRAL DILATATION  age 59    GASTRIC ROUX-EN-Y N/A 10/31/2013   Procedure: LAPAROSCOPIC ROUX-EN-Y GASTRIC BYPASS WITH UPPER ENDOSCOPY;  Surgeon: Morene ONEIDA Olives, MD;  Location: [facility];  Service: General;  Laterality: N/A;   GLUTEUS MINIMUS REPAIR Left 05/28/2021   Procedure: Left GLUTEUS Medius REPAIR;  Surgeon: Genelle Standing, MD;  Location: [facility] OR;  Service: Orthopedics;  Laterality: Left;   ingrown toenail Right    Ingrown nail on great right toe   kidney stent  08/28/2014  mole excision     benign juvenile melanoma removed from left leg - inner thigh   POLYPECTOMY     2013   SKIN LESION EXCISION     back   TONSILLECTOMY  age 71   TUBAL LIGATION  2018   Patient Active Problem List   Diagnosis Date Noted   Essential hypertension 11/04/2022   Panic disorder (episodic paroxysmal anxiety) 07/10/2022   Major depressive disorder, recurrent, moderate (HCC) 07/10/2022   Left renal atrophy 12/19/2021   Stage 2 chronic kidney disease 11/20/2021   GERD (gastroesophageal reflux disease) 09/27/2021   Chronic pain syndrome 09/27/2021   Asthma 07/18/2020   Chronic myofascial pain 03/18/2018   Small fiber neuropathy 03/18/2018   Idiopathic small fiber sensory neuropathy 02/23/2018   Varicose veins of both lower extremities 01/13/2018   Cervical spondylosis without myelopathy 08/28/2017   Migraine with aura and without status migrainosus, not intractable 07/22/2017   Lumbar degenerative disc disease 07/20/2017   Spondylosis of lumbar spine 07/20/2017    Coccydynia 03/12/2017   Bariatric surgery status 05/02/2016   Tachy-brady syndrome (HCC) 12/19/2015   Iron  deficiency anemia 06/10/2015   Sleep disorder 06/01/2013   Generalized anxiety disorder 05/30/2013   Depression, major, recurrent, severe with psychosis (HCC) 06/17/2012   Eating disorder 12/18/2011   Constipation 01/20/2011   Polycystic ovarian syndrome 09/01/2008    REFERRING DIAG: R10.2,G89.29 (ICD-10-CM) - Chronic pelvic pain in female  THERAPY DIAG:  Muscle weakness (generalized)  Other muscle spasm  Unspecified lack of coordination  Abnormal posture  Other low back pain  Pelvic pain  Pain in left hip  Rationale for Evaluation and Treatment Rehabilitation  PERTINENT HISTORY: Breast augmentation surgeries with follow-up reduction with complication, c/s X 2, gastric bypass, bil tubal ligation, long history of chronic low back/pelvic pain, pudendal neuralgia, coccydynia, Lt glute med repair, Lt ovarian cyst, pelvic tumor, small fiber neuropathy, Lt hip OA   PRECAUTIONS: extreme tachycardia, no standing exercise - monitor heart rate, pelvic tumor without biopsy, possible cancer? No dx at this point - just watching blood levels   SUBJECTIVE:                                                                                                                                                                                      SUBJECTIVE STATEMENT:   Pt has been to see cardiology and nephrology. She is showing signs of active damage to her one remaining kidney. They are working on deciding what her fluid intake should be. Her EKG showed new infarct and she is still having chest pain. They are going to look for DVT in Rt LE due to swelling -  but US  is not until the end of August.   She is seeing progress with getting into car and slight improvement with putting on pants in standing. She is also doing better with the two steps to get into and out of her house. She feels like  stretches are very helpful with pain management.   Problem list: -pudendal neuroalgia/intermittent labial pain -Bil hip and low back pain -being unable to sit -deconditioning -urinary urgency/leaking -constipation  -dyspareunia    PAIN: 11/25/23 PAIN:  Are you having pain? Yes NPRS scale: 9/10 Pain location: vagina/labia, low back, hips, lower abdomen Pain orientation: Bilateral  PAIN TYPE: aching, burning, sharp, throbbing, tight, and tingling Pain description: constant  Aggravating factors: not exercising Relieving factors: alternating sides, lying on stomach, small amounts of standing     PRECAUTIONS: None   WEIGHT BEARING RESTRICTIONS: No   FALLS:  Has patient fallen in last 6 months? No   LIVING ENVIRONMENT: Lives with: lives with their family Lives in: House/apartment   OCCUPATION: disabled   PLOF: Independent   PATIENT GOALS: would like to be able to sit; decrease bil hip/low back pain    BOWEL MOVEMENT: Pain with bowel movement: No Type of bowel movement:Frequency 1x/day to every other day and Strain Yes Fully empty rectum: No Leakage: No Pads: No Fiber supplement: No no medication to help stimulate bowel movements   URINATION: Pain with urination: No Fully empty bladder: No Stream:  trouble starting the stream - worse at night Urgency: yes Frequency: every 30 minutes to an hour Leakage:  Laugh, cough, sneeze Pads: No due to irritation    INTERCOURSE: Pain with intercourse: Initial Penetration and During Penetration Ability to have vaginal penetration:  Yes: pain  Climax: yes Marinoff Scale: 2/3   PREGNANCY: Vaginal deliveries 0 Tearing No C-section deliveries 2 Currently pregnant No       OBJECTIVE:  11/25/23: PFIQ-7: 90  FUNCTIONAL TESTS:  Single leg stance:  Rt: pelvic drop with UE support  Lt: pelvic drop with UE support 5x Sit<>stand: 37 seconds and HR went up to 151  LUMBARAROM/PROM:  A/PROM A/PROM  Eval (% available)   Flexion 35  Extension 40  Right lateral flexion 75  Left lateral flexion 75  Right rotation 60  Left rotation 60   (Blank rows = not tested)  PALPATION: signficiant tightness throughout posterior hips and bil lumbar paraspinals  HIP ROM: improving hip flexion with less reproduction of low back pain  09/03/23:  PATIENT SURVEYS:   PFIQ-7: 100  COGNITION: Overall cognitive status: Within functional limits for tasks assessed     SENSATION: Light touch: Appears intact  FUNCTIONAL TESTS:  Single leg stance:  Rt: pelvic drop with UE support  Lt: pelvic drop with UE support  5xSTS: 36 seconds  GAIT: Assistive device utilized: None Comments: WNL  POSTURE: No Significant postural limitations   LUMBARAROM/PROM:  A/PROM A/PROM  Eval (% available)  Flexion 25  Extension 20  Right lateral flexion 50  Left lateral flexion 50  Right rotation 50  Left rotation 50   (Blank rows = not tested)  PALPATION:   General: lumbar paraspinal and oblique muscle restriction  Pelvic Alignment: Lt iliac crest elevation, significant Rt posterior rotation   Abdominal: lower abdominal tenderness                 External Perineal Exam: redness and irritation equal bil  Internal Pelvic Floor: sharp, tender, pain throughout superficial muscles; achy and restricted in bil levator ani with more pain on the Rt compared to the Lt; dryness; paradoxical pelvic floor bearing down  Patient confirms identification and approves PT to assess internal pelvic floor and treatment Yes  PELVIC MMT:   MMT eval  Vaginal 1/5, 5 second hold, 2 repeat contractions - very difficult time with coordination  (Blank rows = not tested)        TONE: High with pain  PROLAPSE: None noted today; she did have paradoxical bearing down contraction so may not have been able to detect     TODAY'S TREATMENT  11/25/23 RE-EVAL Manual: Myofascial release to thoracic and lumbar spine and  paraspinals Prone rhythmic rotation of bil LE 20x bil Sacral and L1-5 PA mobilizations grade 1 Soft tissue mobilization to Rt lateral coccyx border and piriformis  Exercises: Prone P/ROM to bil hips focusing on internal and external rotation in hip flexion; included bil hip flexion today as well    11/19/23 Manual: Myofascial release to thoracic and lumbar spine and paraspinals Prone rhythmic rotation of bil LE 20x bil Sacral and L1-5 PA mobilizations grade 1 Soft tissue mobilization to Rt lateral coccyx border and piriformis  Exercises: Prone P/ROM to bil hips focusing on internal and external rotation in hip flexion; included bil hip flexion today as well Prone: Glute set: 5 x 10 second holds Hip IR set: 5 x 10 second holds ball squeeze at ankles Hip ER set: 5 x 10 second holds out against red band with knee flexion Hip abduction set: 5 x 10 second holds out against red band with straight LE   11/05/23 Manual: Myofascial release to thoracic and lumbar spine and paraspinals Prone rhythmic rotation of bil LE 20x bil Sacral and L1-5 PA mobilizations grade 1 Soft tissue mobilization to Rt lateral coccyx border and piriformis   Exercises: Prone P/ROM to bil hips focusing on internal and external rotation in hip flexion  PATIENT EDUCATION:  09/28/23 Education details: Access Code: [email or web address] Person educated: Patient Education method: Explanation, Demonstration, Tactile cues, Verbal cues, and Handouts Education comprehension: verbalized understanding   HOME EXERCISE PROGRAM: 09/28/23 Access Code: [email or web address] URL: [email or web address] Date: 09/28/2023 Prepared by: Channing Pereyra  Exercises - Prone Diaphragmatic Breathing  - 2 x daily - 7 x weekly - 1 sets - 10 reps - Prone Hip External Rotation AROM  - 1 x daily - 7 x weekly - 1 sets - 10 reps   ASSESSMENT:   CLINICAL IMPRESSION: Pt is a 48 year old female with chief complaints of pelvic pain, pudendal neuralgia,  inability to sit, low back pain, bowel/bladder dysfunction, and dyspareunia. Pt is demonstrating improved ability to get in and out of the car, improved ability to perform 2 steps in and out of the house, and is reporting increased ability to stand and put on clothes. She still demonstrates significant weakness, pelvic instability in single leg stance, no improvements in 5x sit<>stand with use of UE and significant raise in heart rate. She does show some good improvements in low back A/ROM, but still most limited in flexion which is what she has the most difficulty with when getting dressed. She continued to tolerate manual techniques well today and reported pain decrease from 9/10 to 5/10 today. She will continue to benefit from skilled PT intervention in order to decrease pain, improve functional ability specifically sitting tolerance, increase ease of starting stream/emptying bladder, decrease dyspareunia, and improve QOL.  OBJECTIVE IMPAIRMENTS: decreased activity tolerance, decreased coordination, decreased endurance, decreased mobility, decreased ROM, decreased strength, hypomobility, increased fascial restrictions, increased muscle spasms, impaired flexibility, impaired tone, postural dysfunction, and pain.    ACTIVITY LIMITATIONS: bending, sitting, standing, stairs, transfers, bed mobility, and locomotion level, vaginal penetration   PARTICIPATION LIMITATIONS: cleaning, laundry, interpersonal relationship, driving, and community activity   PERSONAL FACTORS: 3+ comorbidities: Breast augmentation surgeries with follow-up reduction with complication, c/s X 2, gastric bypass, bil tubal ligation, long history of chronic low back/pelvic pain, pudendal neuralgia, coccydynia, Lt glute med repair, Lt ovarian cyst, pelvic tumor, small fiber neuropathy, Lt hip OA  are also affecting patient's functional outcome.    REHAB POTENTIAL: Good   CLINICAL DECISION MAKING: Stable/uncomplicated   EVALUATION  COMPLEXITY: Low     GOALS: Goals reviewed with patient? Yes   SHORT TERM GOALS: Updated 11/25/23   Pt will be independent with HEP.    Baseline: Goal status: MET 11/03/23   2.  Pt will be independent with diaphragmatic breathing and down training activities in order to improve pelvic floor relaxation and abdominal wall mobility.   Baseline:  Goal status: IN PROGRESS 11/25/23   3.  Pt will report improve stream initiation and complete voiding with urination in order to decrease urinary leaking that causes skin irritation. Baseline:  Goal status: IN PROGRESS 11/25/23              4.  Pt will increase all impaired lumbar A/ROM by 25% without pain in order to decrease pelvic pain and improve pelvic floor muscle length tension relationship.   Baseline: good progress with exception of flexion which remains most limited Goal status: IN PROGRESS 11/25/23  5. Pt will stop straining with bowel movements to decrease strain on pelvic floor muscles in order to reduce pelvic pain, allowing her to sit for longer periods of time.   Baseline: she reports some improvements  Goal status: IN PROGRESS 11/25/23   LONG TERM GOALS: Updated 11/25/23   Pt will be independent with advanced HEP.    Baseline:  Goal status:IN PROGRESS 11/25/23   2.  Pt will be able to have intercourse with no greater than 5/10 pain in order to maintain intimate relationship with partner.  Baseline: has not had intercourse Goal status: IN PROGRESS 11/25/23   3.  Pt will be able to ascend/descend 12 steps at regular intervals throughout the day safely and with minimal difficulty in order to perform household chores.  Baseline: increased ease with 2 steps  Goal status: IN PROGRESS 11/25/23   4.  Pt will improve 5xSTS to less than 20 seconds and improved PFIQ by 25% in order to demonstrate improved QOL and functional ability.  Baseline: 37 seconds today with bil UE use Goal status: IN PROGRESS 11/25/23   5.  Patient will report  no greater than 5/10 pain with sitting in order to more easily attend appointments, drive, and spend time with family.  Baseline: reached 5/10 for the first time today after session Goal status: IN PROGRESS 11/25/23   6.  Pt will be able to sit for >30 minutes in order to allow heart rate to remain lower and perform seated exercise.    Baseline: no sitting tolerance Goal status: IN PROGRESS 11/25/23  7. Pt will be able to stand on one leg in order to put on pants on without pain or difficulty.    Baseline: able to, but pelvic drop Goal status: IN PROGRESS 11/25/23   PLAN:  PT FREQUENCY: 1x/week   PT DURATION: 12 weeks   PLANNED INTERVENTIONS: Therapeutic exercises, Therapeutic activity, Neuromuscular re-education, Balance training, Gait training, Patient/Family education, Self Care, Joint mobilization, Aquatic Therapy, Dry Needling, Biofeedback, and Manual therapy   PLAN FOR NEXT SESSION: Internal manual techniques to help improve pelvic floor muscle spasm and pain. Work on pelvic alignment. Seems to be a candidate for cranio sacral work, she responds to fascial work, worked around the uterus to reduce restrictions    [employer], PT, DPT07/30/2510:08 AM

## 2023-12-03 DIAGNOSIS — F411 Generalized anxiety disorder: Secondary | ICD-10-CM | POA: Diagnosis not present

## 2023-12-03 DIAGNOSIS — F331 Major depressive disorder, recurrent, moderate: Secondary | ICD-10-CM | POA: Diagnosis not present

## 2023-12-04 ENCOUNTER — Other Ambulatory Visit

## 2023-12-07 ENCOUNTER — Inpatient Hospital Stay: Attending: Physician Assistant

## 2023-12-07 DIAGNOSIS — D75839 Thrombocytosis, unspecified: Secondary | ICD-10-CM | POA: Insufficient documentation

## 2023-12-07 DIAGNOSIS — D508 Other iron deficiency anemias: Secondary | ICD-10-CM | POA: Insufficient documentation

## 2023-12-07 DIAGNOSIS — Z9884 Bariatric surgery status: Secondary | ICD-10-CM | POA: Insufficient documentation

## 2023-12-07 DIAGNOSIS — Z79899 Other long term (current) drug therapy: Secondary | ICD-10-CM | POA: Insufficient documentation

## 2023-12-07 DIAGNOSIS — R7989 Other specified abnormal findings of blood chemistry: Secondary | ICD-10-CM

## 2023-12-07 DIAGNOSIS — D513 Other dietary vitamin B12 deficiency anemia: Secondary | ICD-10-CM

## 2023-12-07 DIAGNOSIS — D7589 Other specified diseases of blood and blood-forming organs: Secondary | ICD-10-CM

## 2023-12-07 DIAGNOSIS — K909 Intestinal malabsorption, unspecified: Secondary | ICD-10-CM | POA: Insufficient documentation

## 2023-12-07 DIAGNOSIS — D509 Iron deficiency anemia, unspecified: Secondary | ICD-10-CM

## 2023-12-07 DIAGNOSIS — D72829 Elevated white blood cell count, unspecified: Secondary | ICD-10-CM | POA: Diagnosis not present

## 2023-12-07 LAB — CBC WITH DIFFERENTIAL/PLATELET
Abs Immature Granulocytes: 0.16 K/uL — ABNORMAL HIGH (ref 0.00–0.07)
Basophils Absolute: 0.1 K/uL (ref 0.0–0.1)
Basophils Relative: 1 %
Eosinophils Absolute: 0.2 K/uL (ref 0.0–0.5)
Eosinophils Relative: 1 %
HCT: 44.7 % (ref 36.0–46.0)
Hemoglobin: 15 g/dL (ref 12.0–15.0)
Immature Granulocytes: 1 %
Lymphocytes Relative: 19 %
Lymphs Abs: 2.3 K/uL (ref 0.7–4.0)
MCH: 32.8 pg (ref 26.0–34.0)
MCHC: 33.6 g/dL (ref 30.0–36.0)
MCV: 97.6 fL (ref 80.0–100.0)
Monocytes Absolute: 0.6 K/uL (ref 0.1–1.0)
Monocytes Relative: 5 %
Neutro Abs: 9.1 K/uL — ABNORMAL HIGH (ref 1.7–7.7)
Neutrophils Relative %: 73 %
Platelets: 467 K/uL — ABNORMAL HIGH (ref 150–400)
RBC: 4.58 MIL/uL (ref 3.87–5.11)
RDW: 12.9 % (ref 11.5–15.5)
WBC: 12.5 K/uL — ABNORMAL HIGH (ref 4.0–10.5)
nRBC: 0 % (ref 0.0–0.2)

## 2023-12-07 LAB — C-REACTIVE PROTEIN: CRP: 2.9 mg/dL — ABNORMAL HIGH (ref <1.0)

## 2023-12-07 LAB — COMPREHENSIVE METABOLIC PANEL WITH GFR
ALT: 20 U/L (ref 0–44)
AST: 19 U/L (ref 15–41)
Albumin: 4.1 g/dL (ref 3.5–5.0)
Alkaline Phosphatase: 98 U/L (ref 38–126)
Anion gap: 10 (ref 5–15)
BUN: 11 mg/dL (ref 6–20)
CO2: 27 mmol/L (ref 22–32)
Calcium: 9.1 mg/dL (ref 8.9–10.3)
Chloride: 103 mmol/L (ref 98–111)
Creatinine, Ser: 0.84 mg/dL (ref 0.44–1.00)
GFR, Estimated: >60 mL/min (ref >60)
Glucose, Bld: 96 mg/dL (ref 70–99)
Potassium: 3.8 mmol/L (ref 3.5–5.1)
Sodium: 140 mmol/L (ref 135–145)
Total Bilirubin: 0.2 mg/dL (ref 0.0–1.2)
Total Protein: 7.4 g/dL (ref 6.5–8.1)

## 2023-12-07 LAB — IRON AND TIBC
Iron: 46 ug/dL (ref 28–170)
Saturation Ratios: 18 % (ref 10.4–31.8)
TIBC: 258 ug/dL (ref 250–450)
UIBC: 212 ug/dL

## 2023-12-07 LAB — FERRITIN: Ferritin: 286 ng/mL (ref 11–307)

## 2023-12-07 LAB — SEDIMENTATION RATE: Sed Rate: 49 mm/h — ABNORMAL HIGH (ref 0–20)

## 2023-12-07 LAB — VITAMIN B12: Vitamin B-12: 1094 pg/mL — ABNORMAL HIGH (ref 180–914)

## 2023-12-07 LAB — FOLATE: Folate: 19.6 ng/mL (ref >5.9)

## 2023-12-11 ENCOUNTER — Encounter: Payer: Self-pay | Admitting: Physical Therapy

## 2023-12-11 ENCOUNTER — Ambulatory Visit: Attending: Obstetrics and Gynecology | Admitting: Physical Therapy

## 2023-12-11 ENCOUNTER — Telehealth: Admitting: Oncology

## 2023-12-11 DIAGNOSIS — R102 Pelvic and perineal pain: Secondary | ICD-10-CM | POA: Insufficient documentation

## 2023-12-11 DIAGNOSIS — M25552 Pain in left hip: Secondary | ICD-10-CM | POA: Diagnosis not present

## 2023-12-11 DIAGNOSIS — R279 Unspecified lack of coordination: Secondary | ICD-10-CM | POA: Diagnosis not present

## 2023-12-11 DIAGNOSIS — M6281 Muscle weakness (generalized): Secondary | ICD-10-CM | POA: Insufficient documentation

## 2023-12-11 DIAGNOSIS — M5459 Other low back pain: Secondary | ICD-10-CM | POA: Insufficient documentation

## 2023-12-11 DIAGNOSIS — M62838 Other muscle spasm: Secondary | ICD-10-CM | POA: Insufficient documentation

## 2023-12-11 DIAGNOSIS — R293 Abnormal posture: Secondary | ICD-10-CM | POA: Diagnosis not present

## 2023-12-11 NOTE — Therapy (Signed)
 OUTPATIENT PHYSICAL THERAPY TREATMENT NOTE   Patient Name: Katelyn [name] MRN: [id number] DOB:08/27/1975, 48 y.o., female Today's Date: 12/11/2023  PCP: Alphonsa Glendia LABOR, MD  REFERRING PROVIDER: Jeralyn Crutch, MD   END OF SESSION:   PT End of Session - 12/11/23 0801     Visit Number 8    Date for PT Re-Evaluation 02/17/24    Authorization Type HTA    Authorization - Number of Visits 8    Progress Note Due on Visit 10    PT Start Time 0800    PT Stop Time 0840    PT Time Calculation (min) 40 min    Activity Tolerance Patient tolerated treatment well    Behavior During Therapy [facility] for tasks assessed/performed                   Past Medical History:  Diagnosis Date   Anemia    Anorexia    Anxiety    on meds   Asthma    controlled with meds   Benign juvenile melanoma    Chronic headaches    Colon polyps    found on colonoscopy 04/26/2012   Complication of anesthesia    itching after epidural for c section   Constipation    Depression    on meds   Depression    several suicide attempts, hospitaluzed in 2012 for this; hx pf ECT treatments ; pt sees Dr. Curry pyschiatrist  and doing well on medication   Dysrhythmia    hx of PVC's (pt hasn't seen cardiologist since 2021- no follow up needed)   GERD (gastroesophageal reflux disease)    Headache    Heart murmur    as a child - no one mentions hearing murmur anymore, sometimes it is heard. Echo has been performed 02/03/18   Heartburn    no meds   History of pneumonia    x 3  years ago - no recent problems   Hx of blood clots 2019   bilateral LE   Hypertension    Left renal atrophy 12/19/2021   LEFT   Neuromuscular disorder (HCC)    small fiber neuropathy   Obesity    Pneumonia    Polycystic ovary    takes metformin  to treat   Skin lesion    Excisional biopsy of moles - none cancerous   Sleep apnea    yrs ago - diagnosed mild sleep apnea - did not have to use cpap    Past Surgical History:   Procedure Laterality Date   BREAST CAPSULECTOMY Bilateral 12/13/2020   1 week later pt had hematoma surgery on left breast   BREAST ENHANCEMENT SURGERY Bilateral 02/11/2017   BREATH TEK H PYLORI N/A 07/22/2013   Procedure: BREATH TEK H PYLORI;  Surgeon: Morene ONEIDA Olives, MD;  Location: [facility];  Service: General;  Laterality: N/A;   CESAREAN SECTION  2004, 2007   x 2   COLONOSCOPY  2013   COLONOSCOPY  2021   MS-F/V-sutab (good)-[facility]   CYSTOSCOPY WITH URETHRAL DILATATION  age 77    GASTRIC ROUX-EN-Y N/A 10/31/2013   Procedure: LAPAROSCOPIC ROUX-EN-Y GASTRIC BYPASS WITH UPPER ENDOSCOPY;  Surgeon: Morene ONEIDA Olives, MD;  Location: [facility];  Service: General;  Laterality: N/A;   GLUTEUS MINIMUS REPAIR Left 05/28/2021   Procedure: Left GLUTEUS Medius REPAIR;  Surgeon: Genelle Standing, MD;  Location: [facility] OR;  Service: Orthopedics;  Laterality: Left;   ingrown toenail Right    Ingrown nail on great right  toe   kidney stent  08/28/2014   mole excision     benign juvenile melanoma removed from left leg - inner thigh   POLYPECTOMY     2013   SKIN LESION EXCISION     back   TONSILLECTOMY  age 35   TUBAL LIGATION  2018   Patient Active Problem List   Diagnosis Date Noted   Essential hypertension 11/04/2022   Panic disorder (episodic paroxysmal anxiety) 07/10/2022   Major depressive disorder, recurrent, moderate (HCC) 07/10/2022   Left renal atrophy 12/19/2021   Stage 2 chronic kidney disease 11/20/2021   GERD (gastroesophageal reflux disease) 09/27/2021   Chronic pain syndrome 09/27/2021   Asthma 07/18/2020   Chronic myofascial pain 03/18/2018   Small fiber neuropathy 03/18/2018   Idiopathic small fiber sensory neuropathy 02/23/2018   Varicose veins of both lower extremities 01/13/2018   Cervical spondylosis without myelopathy 08/28/2017   Migraine with aura and without status migrainosus, not intractable 07/22/2017   Lumbar degenerative disc disease 07/20/2017   Spondylosis  of lumbar spine 07/20/2017   Coccydynia 03/12/2017   Bariatric surgery status 05/02/2016   Tachy-brady syndrome (HCC) 12/19/2015   Iron  deficiency anemia 06/10/2015   Sleep disorder 06/01/2013   Generalized anxiety disorder 05/30/2013   Depression, major, recurrent, severe with psychosis (HCC) 06/17/2012   Eating disorder 12/18/2011   Constipation 01/20/2011   Polycystic ovarian syndrome 09/01/2008    REFERRING DIAG: R10.2,G89.29 (ICD-10-CM) - Chronic pelvic pain in female  THERAPY DIAG:  Muscle weakness (generalized)  Other muscle spasm  Unspecified lack of coordination  Abnormal posture  Other low back pain  Pelvic pain  Pain in left hip  Rationale for Evaluation and Treatment Rehabilitation  PERTINENT HISTORY: Breast augmentation surgeries with follow-up reduction with complication, c/s X 2, gastric bypass, bil tubal ligation, long history of chronic low back/pelvic pain, pudendal neuralgia, coccydynia, Lt glute med repair, Lt ovarian cyst, pelvic tumor, small fiber neuropathy, Lt hip OA   PRECAUTIONS: extreme tachycardia, no standing exercise - monitor heart rate, pelvic tumor without biopsy, possible cancer? No dx at this point - just watching blood levels   SUBJECTIVE:                                                                                                                                                                                      SUBJECTIVE STATEMENT:   12/11/23: I have had a lot of problems with my back and hips. I have tried to walk the steps every day and that could be aggravating my hips . My inflammation markers are high. Pelvic ultrasound to check fibroid and saw a thickened wall of the uterus.  I want to get through my regular day and get stronger.   Pt has been to see cardiology and nephrology. She is showing signs of active damage to her one remaining kidney. They are working on deciding what her fluid intake should be. Her EKG showed new  infarct and she is still having chest pain. They are going to look for DVT in Rt LE due to swelling - but US  is not until the end of August.   She is seeing progress with getting into car and slight improvement with putting on pants in standing. She is also doing better with the two steps to get into and out of her house. She feels like stretches are very helpful with pain management.   Problem list: -pudendal neuroalgia/intermittent labial pain -Bil hip and low back pain -being unable to sit -deconditioning -urinary urgency/leaking -constipation  -dyspareunia    PAIN: 11/25/23 PAIN:  Are you having pain? Yes NPRS scale: 9/10 12/11/23: pain level 8/10 Pain location: vagina/labia, low back, hips, lower abdomen Pain orientation: Bilateral  PAIN TYPE: aching, burning, sharp, throbbing, tight, and tingling Pain description: constant  Aggravating factors: not exercising Relieving factors: alternating sides, lying on stomach, small amounts of standing     PRECAUTIONS: None   WEIGHT BEARING RESTRICTIONS: No   FALLS:  Has patient fallen in last 6 months? No   LIVING ENVIRONMENT: Lives with: lives with their family Lives in: House/apartment   OCCUPATION: disabled   PLOF: Independent   PATIENT GOALS: would like to be able to sit; decrease bil hip/low back pain    BOWEL MOVEMENT: Pain with bowel movement: No Type of bowel movement:Frequency 1x/day to every other day and Strain Yes Fully empty rectum: No Leakage: No Pads: No Fiber supplement: No no medication to help stimulate bowel movements   URINATION: Pain with urination: No Fully empty bladder: No Stream:  trouble starting the stream - worse at night Urgency: yes Frequency: every 30 minutes to an hour Leakage:  Laugh, cough, sneeze Pads: No due to irritation    INTERCOURSE: Pain with intercourse: Initial Penetration and During Penetration Ability to have vaginal penetration:  Yes: pain  Climax: yes Marinoff  Scale: 2/3   PREGNANCY: Vaginal deliveries 0 Tearing No C-section deliveries 2 Currently pregnant No       OBJECTIVE:  11/25/23: PFIQ-7: 90  FUNCTIONAL TESTS:  Single leg stance:  Rt: pelvic drop with UE support  Lt: pelvic drop with UE support 5x Sit<>stand: 37 seconds and HR went up to 151  LUMBARAROM/PROM:  A/PROM A/PROM  Eval (% available)  Flexion 35  Extension 40  Right lateral flexion 75  Left lateral flexion 75  Right rotation 60  Left rotation 60   (Blank rows = not tested)  PALPATION: signficiant tightness throughout posterior hips and bil lumbar paraspinals  HIP ROM: improving hip flexion with less reproduction of low back pain  09/03/23:  PATIENT SURVEYS:   PFIQ-7: 100  COGNITION: Overall cognitive status: Within functional limits for tasks assessed     SENSATION: Light touch: Appears intact  FUNCTIONAL TESTS:  Single leg stance:  Rt: pelvic drop with UE support  Lt: pelvic drop with UE support  5xSTS: 36 seconds  GAIT: Assistive device utilized: None Comments: WNL  POSTURE: No Significant postural limitations   LUMBARAROM/PROM:  A/PROM A/PROM  Eval (% available)  Flexion 25  Extension 20  Right lateral flexion 50  Left lateral flexion 50  Right rotation 50  Left rotation 50   (  Blank rows = not tested)  PALPATION:   General: lumbar paraspinal and oblique muscle restriction  Pelvic Alignment: Lt iliac crest elevation, significant Rt posterior rotation   Abdominal: lower abdominal tenderness                 External Perineal Exam: redness and irritation equal bil                             Internal Pelvic Floor: sharp, tender, pain throughout superficial muscles; achy and restricted in bil levator ani with more pain on the Rt compared to the Lt; dryness; paradoxical pelvic floor bearing down  Patient confirms identification and approves PT to assess internal pelvic floor and treatment Yes  PELVIC MMT:   MMT eval   Vaginal 1/5, 5 second hold, 2 repeat contractions - very difficult time with coordination  (Blank rows = not tested)        TONE: High with pain  PROLAPSE: None noted today; she did have paradoxical bearing down contraction so may not have been able to detect     TODAY'S TREATMENT  12/11/23 Manual: Soft tissue mobilization: Prone rhythmic rotation of bil LE 20x bil Manual work to the quadratus trigger  point bilaterally for a release Myofascial release: Fascial release of the sacrum to the low thoracic pulling the tissue apart gently  Spinal mobilization: Sacral and L1-5 PA mobilizations grade 1 Exercises: Strengthening: Prone: Glute set: 5 x 10 second holds Hip IR set: 5 x 10 second holds ball squeeze at ankles Hip ER set: 5 x 10 second holds out against red band with knee flexion Hip abduction set: 5 x 10 second holds out against red band with straight LE    11/25/23 RE-EVAL Manual: Myofascial release to thoracic and lumbar spine and paraspinals Prone rhythmic rotation of bil LE 20x bil Sacral and L1-5 PA mobilizations grade 1 Soft tissue mobilization to Rt lateral coccyx border and piriformis  Exercises: Prone P/ROM to bil hips focusing on internal and external rotation in hip flexion; included bil hip flexion today as well    11/19/23 Manual: Myofascial release to thoracic and lumbar spine and paraspinals Prone rhythmic rotation of bil LE 20x bil Sacral and L1-5 PA mobilizations grade 1 Soft tissue mobilization to Rt lateral coccyx border and piriformis  Exercises: Prone P/ROM to bil hips focusing on internal and external rotation in hip flexion; included bil hip flexion today as well Prone: Glute set: 5 x 10 second holds Hip IR set: 5 x 10 second holds ball squeeze at ankles Hip ER set: 5 x 10 second holds out against red band with knee flexion Hip abduction set: 5 x 10 second holds out against red band with straight LE   11/05/23 Manual: Myofascial  release to thoracic and lumbar spine and paraspinals Prone rhythmic rotation of bil LE 20x bil Sacral and L1-5 PA mobilizations grade 1 Soft tissue mobilization to Rt lateral coccyx border and piriformis   Exercises: Prone P/ROM to bil hips focusing on internal and external rotation in hip flexion  PATIENT EDUCATION:  09/28/23 Education details: Access Code: [email or web address] Person educated: Patient Education method: Explanation, Demonstration, Tactile cues, Verbal cues, and Handouts Education comprehension: verbalized understanding   HOME EXERCISE PROGRAM: 09/28/23 Access Code: [email or web address] URL: [email or web address] Date: 09/28/2023 Prepared by: Channing Pereyra  Exercises - Prone Diaphragmatic Breathing  - 2 x daily - 7 x weekly - 1 sets - 10 reps - Prone  Hip External Rotation AROM  - 1 x daily - 7 x weekly - 1 sets - 10 reps   ASSESSMENT:   CLINICAL IMPRESSION: Pt is a 48 year old female with chief complaints of pelvic pain, pudendal neuralgia, inability to sit, low back pain, bowel/bladder dysfunction, and dyspareunia. Patient was able to move her right leg better than the left. She had increased movement of L3 and L4 after manual work. She had tenderness with the sacral mobilization. The left hip internal rotation increased after the rhythmic motion. She will continue to benefit from skilled PT intervention in order to decrease pain, improve functional ability specifically sitting tolerance, increase ease of starting stream/emptying bladder, decrease dyspareunia, and improve QOL.    OBJECTIVE IMPAIRMENTS: decreased activity tolerance, decreased coordination, decreased endurance, decreased mobility, decreased ROM, decreased strength, hypomobility, increased fascial restrictions, increased muscle spasms, impaired flexibility, impaired tone, postural dysfunction, and pain.    ACTIVITY LIMITATIONS: bending, sitting, standing, stairs, transfers, bed mobility, and locomotion level, vaginal  penetration   PARTICIPATION LIMITATIONS: cleaning, laundry, interpersonal relationship, driving, and community activity   PERSONAL FACTORS: 3+ comorbidities: Breast augmentation surgeries with follow-up reduction with complication, c/s X 2, gastric bypass, bil tubal ligation, long history of chronic low back/pelvic pain, pudendal neuralgia, coccydynia, Lt glute med repair, Lt ovarian cyst, pelvic tumor, small fiber neuropathy, Lt hip OA  are also affecting patient's functional outcome.    REHAB POTENTIAL: Good   CLINICAL DECISION MAKING: Stable/uncomplicated   EVALUATION COMPLEXITY: Low     GOALS: Goals reviewed with patient? Yes   SHORT TERM GOALS: Updated 11/25/23   Pt will be independent with HEP.    Baseline: Goal status: MET 11/03/23   2.  Pt will be independent with diaphragmatic breathing and down training activities in order to improve pelvic floor relaxation and abdominal wall mobility.   Baseline:  Goal status: IN PROGRESS 11/25/23   3.  Pt will report improve stream initiation and complete voiding with urination in order to decrease urinary leaking that causes skin irritation. Baseline:  Goal status: IN PROGRESS 11/25/23              4.  Pt will increase all impaired lumbar A/ROM by 25% without pain in order to decrease pelvic pain and improve pelvic floor muscle length tension relationship.   Baseline: good progress with exception of flexion which remains most limited Goal status: IN PROGRESS 11/25/23  5. Pt will stop straining with bowel movements to decrease strain on pelvic floor muscles in order to reduce pelvic pain, allowing her to sit for longer periods of time.   Baseline: she reports some improvements  Goal status: IN PROGRESS 11/25/23   LONG TERM GOALS: Updated 11/25/23   Pt will be independent with advanced HEP.    Baseline:  Goal status:IN PROGRESS 11/25/23   2.  Pt will be able to have intercourse with no greater than 5/10 pain in order to maintain  intimate relationship with partner.  Baseline: has not had intercourse Goal status: IN PROGRESS 11/25/23   3.  Pt will be able to ascend/descend 12 steps at regular intervals throughout the day safely and with minimal difficulty in order to perform household chores.  Baseline: increased ease with 2 steps  Goal status: IN PROGRESS 11/25/23   4.  Pt will improve 5xSTS to less than 20 seconds and improved PFIQ by 25% in order to demonstrate improved QOL and functional ability.  Baseline: 37 seconds today with bil UE use Goal status:  IN PROGRESS 11/25/23   5.  Patient will report no greater than 5/10 pain with sitting in order to more easily attend appointments, drive, and spend time with family.  Baseline: reached 5/10 for the first time today after session Goal status: IN PROGRESS 11/25/23   6.  Pt will be able to sit for >30 minutes in order to allow heart rate to remain lower and perform seated exercise.    Baseline: no sitting tolerance Goal status: IN PROGRESS 11/25/23  7. Pt will be able to stand on one leg in order to put on pants on without pain or difficulty.    Baseline: able to, but pelvic drop Goal status: IN PROGRESS 11/25/23   PLAN:   PT FREQUENCY: 1x/week   PT DURATION: 12 weeks   PLANNED INTERVENTIONS: Therapeutic exercises, Therapeutic activity, Neuromuscular re-education, Balance training, Gait training, Patient/Family education, Self Care, Joint mobilization, Aquatic Therapy, Dry Needling, Biofeedback, and Manual therapy   PLAN FOR NEXT SESSION: Internal manual techniques to help improve pelvic floor muscle spasm and pain. Work on pelvic alignment. Seems to be a candidate for cranio sacral work, she responds to fascial work, worked around the uterus to reduce restrictions   Channing Pereyra, PT 12/11/23 8:46 AM

## 2023-12-13 LAB — METHYLMALONIC ACID, SERUM: Methylmalonic Acid, Quantitative: 195 nmol/L (ref 0–378)

## 2023-12-14 NOTE — Progress Notes (Unsigned)
 VIRTUAL VISIT via TELEPHONE NOTE [facility]   I connected with Katelyn [name]  on 12/15/23 at  8:20 AM by telephone and verified that I am speaking with the correct person using two identifiers.  Location: Patient: Home Provider: [facility]   I discussed the limitations, risks, security and privacy concerns of performing an evaluation and management service by telephone and the availability of in person appointments. I also discussed with the patient that there may be a patient responsible charge related to this service. The patient expressed understanding and agreed to proceed.  REASON FOR VISIT: Iron  deficiency + macrocytosis + elevated B12 + leukocytosis/thrombocytosis  INTERVAL HISTORY:  Katelyn [name] is contacted today for follow-up of her leukocytosis and thrombocytosis as well as her iron  deficiency state.  She was last evaluated via telemedicine visit by NP Delon Hope on 07/31/2023.  Patient continues to report a constellation of symptoms as described in ROS, and feels that overall she has been declining in health ever since she had meningitis in 2023. She continues to have diffuse chronic pain all over her body. She reports subjective low-grade fevers a few times each week, which have been occurring for the past few years (ever since meningitis).   She denies any recent infections or antibiotics.  No current steroid medications. She denies any masses or lymphadenopathy. She reports profound fatigue. She reports normal menstrual bleeding.  She denies any rectal bleeding or melena.  No ice pica. She has little to no energy and 50% appetite.   ASSESSMENT & PLAN:  1.  Intermittent leukocytosis & thrombocytosis - She has had intermittent leukocytosis since at least 2013, neutrophil predominant.  Persistent leukocytosis ever since meningitis in June 2023. - Clinically seems to have some underlying pro-inflammatory state, although  previous autoimmune workup at [facility] was negative. - She denies any active steroids (reports Symbicort  is old prescription, and has not yet used desonide  cream) - Non-smoker. - No recent infections. - No masses or lymphadenopathy. - Reports intermittent low-grade fevers ever since she was diagnosed with meningitis in 2023. - Reports facial flushing and diaphoresis that occurs intermittently, usually when standing. - MPN workup negative, but inflammatory markers elevated. BCR-ABL FISH negative JAK2 with reflex to CALR, MPL, Exons 12-15 negative. Elevated ESR 40, elevated CRP 3.1 Negative ANA, normal rheumatoid factor. - Bone marrow biopsy from 01/27/2023 showed no significant CD34 positive blastic population identified.  T cells with nonspecific changes.  No monoclonal B-cell population identified.  Normocellular bone marrow for age with trilineage hematopoiesis.  There are mild dyspoietic changes involving the granular lytic cell lines with no increase in blastic cells.  The findings are not considered specific or diagnostic of a myeloid neoplasm and may be secondary in nature to infection, immune mediated process or medication. - Most recent labs (12/07/2023): Mild leukocytosis and thrombocytosis remain at baseline with WBC 12.5/ANC 9.1, platelets 467. Elevated ESR 49, elevated CRP 2.9 - Overall, differential diagnosis continues to favor reactive leukocytosis and thrombocytosis secondary to inflammatory state. - PLAN: MPN workup negative to date.  Suspect reactive leukocytosis and thrombocytosis from underlying pro-inflammatory state. - Discussed extensively with patient that she does not have any evidence of a primary blood disorder, but that her hematologic abnormalities are likely reactive secondary to another problem. - Labs in 4 months = CBC/D, LDH, ESR, CRP - OFFICE visit in 4 months - Would consider repeat bone marrow biopsy if any significant changes from baseline. - We have  entered  referral for rheumatology workup of inflammatory state that appears to been present ever since she was diagnosed and treated for meningitis in 2023.  (Scheduled for new patient evaluation with Dr. Jeannetta in January 2026)  2.  Iron  deficiency state: - Malabsorption in the setting of prior gastric bypass surgery and chronic PPI use. - Patient denies any signs or symptoms of abnormal blood loss; no bright red blood per rectum or melena.  Menses are regular, last 3 days, but with heavy blood loss and clots.     - She reports that her surgery in August 2022 (removal of breast implants) resulted in large hematoma requiring surgical drainage - Injectafer  on 01/28/2019 and 05/04/2020 - patient had a reaction (vs. side effects) after her Injectafer  treatment (fever, headaches, general malaise), despite being premedicated with Benadryl , Tylenol , and Solu-Medrol .  She had previous reaction to Feraheme  and has multiple medication allergies. - Most recent IV iron  (Venofer  200 mg x 2) in January/February 2025 - Most recent labs (12/07/2023): Hgb 15.0/MCV 97.6, ferritin 286, iron  saturation 18% - PLAN:  No strong indication for IV iron  at this time  - Due to history of side effects and mild reaction to Injectafer  and Feraheme , we will premedicate with steroids for any future IV iron  infusions.  - Discussed with patient that flulike syndrome and malaise is a normal side effect in some patients, and does not constitute a medical allergy .  - We will recheck labs and RTC in 4 months with phone visit  3.  Macrocytosis, RESOLVED: - She has intermittent mild macrocytosis - She had history of fatty infiltration of liver prior to her gastric bypass surgery (per abdominal US  on 07/13/2009).  No subsequent imaging after surgery in 2015. - Additional work-up including SPEP and TSH was unremarkable.  LFTs normal.  Normal LDH, ESR, CRP, copper . - No vitamin B12 or folic acid  deficiency per labs from 09/10/2021 - Most recent abdominal  imaging with CT abdomen/renal stone study (10/14/2020): Unremarkable unenhanced appearance of liver, no focal lesion identified; spleen normal in size without focal abnormality - Most recent MCV normal  4.  Elevated B12 levels: - She had a persistently elevated B12 level since at least June of 2021 (highest B12 was around 4000 in September 2021) - Most recent labs (12/07/2023) shows nearly normal vitamin B12 at 1,094.  Normal MMA and folate. - She takes half of a bariatric vitamin at home daily. - Unclear etiology, I have explained to her that this is most likely secondary to inflammation - PLAN: We will continue to watch and wait.  No immediate cause for concern from a hematology/oncology standpoint.  5.  OTHER PROBLEMS - She is following with cardiology and neurology due to autonomic dysfunction (POTS) and small fiber neuropathy, with complex chronic pain syndrome. - Overall, patient reports functional decline ever since being diagnosed with meningitis in June 2023. - Patient presents with myriad of other symptoms as described in HPI that are nonspecific and not apparently related to her hematologic complaints  PLAN SUMMARY: >> Referral to rheumatology for workup of inflammatory state >> Labs in 4 months = CBC/D, ferritin, iron /TIBC, ESR, CRP, LDH >> PHONE visit in 4 months (1 week after labs)  **Note:  Patient does not feel comfortable stretching time between visits to longer than 4 months.  Alternating visits between APPs.     REVIEW OF SYSTEMS:   Review of Systems  Constitutional:  Positive for fever and malaise/fatigue. Negative for chills, diaphoresis and weight loss.  Respiratory:  Positive for shortness of breath (with exertion). Negative for cough.   Cardiovascular:  Positive for chest pain. Negative for palpitations.  Gastrointestinal:  Positive for constipation, nausea and vomiting. Negative for abdominal pain, blood in stool and melena.  Musculoskeletal:  Positive for back  pain, joint pain, myalgias and neck pain.  Neurological:  Positive for dizziness, tingling and headaches.  Psychiatric/Behavioral:  The patient is nervous/anxious and has insomnia.      PHYSICAL EXAM: (per limitations of virtual telephone visit)  The patient is alert and oriented x 3, exhibiting adequate mentation, good mood, and ability to speak in full sentences and execute sound judgement.  WRAP UP:   I discussed the assessment and treatment plan with the patient. The patient was provided an opportunity to ask questions and all were answered. The patient agreed with the plan and demonstrated an understanding of the instructions.   The patient was advised to call back or seek an in-person evaluation if the symptoms worsen or if the condition fails to improve as anticipated.  I provided 35 minutes of non-face-to-face time during this encounter, including > 10 minutes of medical discussion.  Pleasant CHRISTELLA Barefoot, PA-C 12/15/23 11:43 AM

## 2023-12-15 ENCOUNTER — Inpatient Hospital Stay (HOSPITAL_BASED_OUTPATIENT_CLINIC_OR_DEPARTMENT_OTHER): Admitting: Physician Assistant

## 2023-12-15 ENCOUNTER — Other Ambulatory Visit: Payer: Self-pay

## 2023-12-15 ENCOUNTER — Encounter: Payer: Self-pay | Admitting: Family Medicine

## 2023-12-15 DIAGNOSIS — R7982 Elevated C-reactive protein (CRP): Secondary | ICD-10-CM

## 2023-12-15 DIAGNOSIS — R700 Elevated erythrocyte sedimentation rate: Secondary | ICD-10-CM | POA: Diagnosis not present

## 2023-12-15 DIAGNOSIS — R7989 Other specified abnormal findings of blood chemistry: Secondary | ICD-10-CM

## 2023-12-15 DIAGNOSIS — D72829 Elevated white blood cell count, unspecified: Secondary | ICD-10-CM

## 2023-12-15 DIAGNOSIS — D509 Iron deficiency anemia, unspecified: Secondary | ICD-10-CM

## 2023-12-15 NOTE — Progress Notes (Signed)
 Ref to e

## 2023-12-15 NOTE — Telephone Encounter (Signed)
 Nurses I was able to read through the message I certainly am sympathetic to her situation  With the TSH being in a normal range but the free T4 being in the low normal range it would be better if we pursue a endocrinology consult because that particular finding goes outside my area of expertise  I would recommend consultation with endocrinology either [place] [place] locally or down in [place] thank you  Katelyn [name] has a follow-up visit with us  in September we look forward to seeing her  You may share the message with Jon thanks-Dr. Glendia

## 2023-12-22 ENCOUNTER — Ambulatory Visit: Admitting: "Endocrinology

## 2023-12-22 ENCOUNTER — Encounter: Payer: Self-pay | Admitting: "Endocrinology

## 2023-12-22 VITALS — BP 110/86 | HR 114 | Ht 65.5 in | Wt 277.6 lb

## 2023-12-22 DIAGNOSIS — R7989 Other specified abnormal findings of blood chemistry: Secondary | ICD-10-CM | POA: Diagnosis not present

## 2023-12-22 DIAGNOSIS — Z9884 Bariatric surgery status: Secondary | ICD-10-CM | POA: Diagnosis not present

## 2023-12-22 DIAGNOSIS — E782 Mixed hyperlipidemia: Secondary | ICD-10-CM | POA: Diagnosis not present
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
## 2023-12-23 DIAGNOSIS — F411 Generalized anxiety disorder: Secondary | ICD-10-CM | POA: Diagnosis not present

## 2023-12-23 DIAGNOSIS — F331 Major depressive disorder, recurrent, moderate: Secondary | ICD-10-CM | POA: Diagnosis not present

## 2023-12-23 LAB — COMPREHENSIVE METABOLIC PANEL WITH GFR
ALT: 21 IU/L (ref 0–32)
AST: 18 IU/L (ref 0–40)
Albumin: 4.4 g/dL (ref 3.9–4.9)
Alkaline Phosphatase: 133 IU/L — ABNORMAL HIGH (ref 44–121)
BUN/Creatinine Ratio: 16 (ref 9–23)
BUN: 13 mg/dL (ref 6–24)
Bilirubin Total: 0.2 mg/dL (ref 0.0–1.2)
CO2: 24 mmol/L (ref 20–29)
Calcium: 9.4 mg/dL (ref 8.7–10.2)
Chloride: 101 mmol/L (ref 96–106)
Creatinine, Ser: 0.8 mg/dL (ref 0.57–1.00)
Globulin, Total: 2.6 g/dL (ref 1.5–4.5)
Glucose: 105 mg/dL — ABNORMAL HIGH (ref 70–99)
Potassium: 4.4 mmol/L (ref 3.5–5.2)
Sodium: 140 mmol/L (ref 134–144)
Total Protein: 7 g/dL (ref 6.0–8.5)
eGFR: 91 mL/min/1.73 (ref >59)

## 2023-12-23 LAB — CORTISOL-AM, BLOOD: Cortisol - AM: 7.8 ug/dL (ref 6.2–19.4)

## 2023-12-23 LAB — T3: T3, Total: 184 ng/dL — ABNORMAL HIGH (ref 71–180)

## 2023-12-23 LAB — LIPID PANEL
Chol/HDL Ratio: 5.6 ratio — ABNORMAL HIGH (ref 0.0–4.4)
Cholesterol, Total: 190 mg/dL (ref 100–199)
HDL: 34 mg/dL — ABNORMAL LOW (ref >39)
LDL Chol Calc (NIH): 103 mg/dL — ABNORMAL HIGH (ref 0–99)
Triglycerides: 314 mg/dL — ABNORMAL HIGH (ref 0–149)
VLDL Cholesterol Cal: 53 mg/dL — ABNORMAL HIGH (ref 5–40)

## 2023-12-23 LAB — VITAMIN B12: Vitamin B-12: 1339 pg/mL — ABNORMAL HIGH (ref 232–1245)

## 2023-12-23 LAB — HEMOGLOBIN A1C
Est. average glucose Bld gHb Est-mCnc: 108 mg/dL
Hgb A1c MFr Bld: 5.4 % (ref 4.8–5.6)

## 2023-12-23 LAB — VITAMIN D 25 HYDROXY (VIT D DEFICIENCY, FRACTURES): Vit D, 25-Hydroxy: 34.6 ng/mL (ref 30.0–100.0)

## 2023-12-23 LAB — THYROID PEROXIDASE ANTIBODY: Thyroperoxidase Ab SerPl-aCnc: <9 [IU]/mL (ref 0–34)

## 2023-12-23 LAB — TSH: TSH: 2.75 u[IU]/mL (ref 0.450–4.500)

## 2023-12-23 LAB — THYROGLOBULIN ANTIBODY: Thyroglobulin Antibody: <1 [IU]/mL (ref 0.0–0.9)

## 2023-12-23 LAB — T4, FREE: Free T4: 0.88 ng/dL (ref 0.82–1.77)

## 2023-12-25 ENCOUNTER — Ambulatory Visit: Admitting: "Endocrinology

## 2023-12-25 ENCOUNTER — Encounter: Payer: Self-pay | Admitting: "Endocrinology

## 2023-12-25 VITALS — BP 138/94 | HR 100 | Ht 65.5 in | Wt 275.6 lb

## 2023-12-25 DIAGNOSIS — Z6841 Body Mass Index (BMI) 40.0 and over, adult: Secondary | ICD-10-CM

## 2023-12-25 DIAGNOSIS — Z9884 Bariatric surgery status: Secondary | ICD-10-CM

## 2023-12-25 DIAGNOSIS — E162 Hypoglycemia, unspecified: Secondary | ICD-10-CM

## 2023-12-25 DIAGNOSIS — E782 Mixed hyperlipidemia: Secondary | ICD-10-CM | POA: Diagnosis not present

## 2023-12-25 DIAGNOSIS — R7989 Other specified abnormal findings of blood chemistry: Secondary | ICD-10-CM

## 2023-12-25 MED ORDER — DEXCOM G7 SENSOR MISC: 2 refills | Status: AC

## 2023-12-25 MED ORDER — EZETIMIBE 10 MG PO TABS: 10.0000 mg | ORAL_TABLET | Freq: Every day | ORAL | 1 refills | Status: AC
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
## 2023-12-25 NOTE — Patient Instructions (Addendum)
                                       Advice for Weight Management  -For most of us  the best way to lose weight is by diet management. Generally speaking, diet management means consuming less calories intentionally which over time brings about progressive weight loss.  This can be achieved more effectively by avoiding ultra processed carbohydrates, processed meats, unhealthy fats.    It is critically important to know your numbers: how much calorie you are consuming and how much calorie you need. More importantly, our carbohydrates sources should be unprocessed naturally occurring  complex starch food items.  It is always important to balance nutrition also by  appropriate intake of proteins (mainly plant-based), healthy fats/oils, plenty of fruits and vegetables.   -The American College of Lifestyle Medicine (ACL M) recommends nutrition derived mostly from Whole Food, Plant Predominant Sources example an apple instead of applesauce or apple pie. Eat Plenty of vegetables, Mushrooms, fruits, Legumes, Whole Grains, Nuts, seeds in lieu of processed meats, processed snacks/pastries red meat, poultry, eggs.  Use only water or unsweetened tea for hydration.  The College also recommends the need to stay away from risky substances including alcohol, smoking; obtaining 7-9 hours of restorative sleep, at least 150 minutes of moderate intensity exercise weekly, importance of healthy social connections, and being mindful of stress and seek help when it is overwhelming.    -Sticking to a routine mealtime to eat 3 meals a day and avoiding unnecessary snacks is shown to have a big role in weight control. Under normal circumstances, the only time we burn stored energy is when we are hungry, so allow  some hunger to take place- hunger means no food between appropriate meal times, only water.  It is not advisable to starve.   -It is better to avoid simple carbohydrates including:  Cakes, Sweet Desserts, Ice Cream, Soda (diet and regular), Sweet Tea, Candies, Chips, Cookies, Store Bought Juices, Alcohol in Excess of  1-2 drinks a day, Lemonade,  Artificial Sweeteners, Doughnuts, Coffee Creamers, Sugar-free Products, etc, etc.  This is not a complete list...SABRA.    -Consulting with certified diabetes educators is proven to provide you with the most accurate and current information on diet.  Also, you may be  interested in discussing diet options/exchanges , we can schedule a visit with Penny Crumpton, RDN, CDE for individualized nutrition education.  -Exercise: If you are able: 30 -60 minutes a day ,4 days a week, or 150 minutes of moderate intensity exercise weekly.    The longer the better if tolerated.  Combine stretch, strength, and aerobic activities.  If you were told in the past that you have high risk for cardiovascular diseases, or if you are currently symptomatic, you may seek evaluation by your heart doctor prior to initiating moderate to intense exercise programs.

## 2023-12-30 ENCOUNTER — Ambulatory Visit (HOSPITAL_COMMUNITY)
Admission: RE | Admit: 2023-12-30 | Discharge: 2023-12-30 | Disposition: A | Discharge status: Home / Self Care | Source: Ambulatory Visit | Attending: Student | Admitting: Student

## 2023-12-30 ENCOUNTER — Ambulatory Visit (HOSPITAL_BASED_OUTPATIENT_CLINIC_OR_DEPARTMENT_OTHER)
Admission: RE | Admit: 2023-12-30 | Discharge: 2023-12-30 | Discharge status: Home / Self Care | Source: Ambulatory Visit | Attending: Student

## 2023-12-30 DIAGNOSIS — M7989 Other specified soft tissue disorders: Secondary | ICD-10-CM

## 2023-12-30 DIAGNOSIS — M79606 Pain in leg, unspecified: Secondary | ICD-10-CM | POA: Diagnosis not present

## 2023-12-30 DIAGNOSIS — M79604 Pain in right leg: Secondary | ICD-10-CM

## 2023-12-30 LAB — VAS US ABI WITH/WO TBI
Left ABI: 0.89
Right ABI: 0.91

## 2023-12-31 ENCOUNTER — Ambulatory Visit: Payer: Self-pay | Admitting: Student

## 2023-12-31 ENCOUNTER — Ambulatory Visit: Admitting: Family Medicine

## 2023-12-31 VITALS — BP 118/78 | HR 89 | Temp 98.1°F | Ht 65.5 in | Wt 275.0 lb

## 2023-12-31 DIAGNOSIS — Z8659 Personal history of other mental and behavioral disorders: Secondary | ICD-10-CM | POA: Diagnosis not present

## 2023-12-31 DIAGNOSIS — E162 Hypoglycemia, unspecified: Secondary | ICD-10-CM

## 2023-12-31 DIAGNOSIS — G608 Other hereditary and idiopathic neuropathies: Secondary | ICD-10-CM

## 2023-12-31 DIAGNOSIS — I495 Sick sinus syndrome: Secondary | ICD-10-CM | POA: Diagnosis not present

## 2023-12-31 MED ORDER — NYSTATIN-TRIAMCINOLONE 100000-0.1 UNIT/GM-% EX OINT: 1.0000 | TOPICAL_OINTMENT | Freq: Two times a day (BID) | CUTANEOUS | 4 refills | Status: AC

## 2023-12-31 NOTE — Progress Notes (Signed)
 The 10-year ASCVD risk score (Arnett DK, et al., 2019) is: 4.1%   Values used to calculate the score:     Age: 48 years     Clincally relevant sex: Female     Is Non-Hispanic African American: No     Diabetic: Yes     Tobacco smoker: No     Systolic Blood Pressure: 118 mmHg     Is BP treated: Yes     HDL Cholesterol: 34 mg/dL     Total Cholesterol: 190 mg/dL  History of Present Illness Katelyn [name] is a 48 year old female with reactive hypoglycemia and gastrointestinal symptoms who presents with concerns about blood sugar management.  She has been experiencing reactive hypoglycemia, particularly after her bariatric surgery. She uses a Dexcom monitor to track her blood sugar levels, which have been running low. She feels anxious about eating due to fear of blood sugar spikes and subsequent drops. She has been on a plant-based diet for the past eight days, resulting in a weight loss of six to seven pounds. She has a history of eating disorders and is concerned that the use of the Dexcom monitor might exacerbate these issues.  She experiences nausea and cyclical vomiting almost every other day, which she attributes to her slow gastrointestinal transit. A CT scan showed slow transit with stools up to the small bowel, an enlarged gallbladder, and fatty liver. She is hesitant about starting GLP-1 medications due to potential gastrointestinal side effects and her history of vomiting.  She reports swelling in one leg, prompting a cardiac workup including a cardiac MRI and ABI studies. She reports that the MRI was performed and that the ABI study showed some mild artery disease, more on the left than the right, as she recalls from what she was told. She experiences pain in her calves and upper thighs, which she attributes to leaky valves in her veins as shown in a previous venous study.  Her social history includes spending time with her husband and children, playing cards, and supporting her  husband in his sobriety journey. She is unable to drive and relies on her husband for support. She is also involved in her children's activities and health management. Past Medical History Copy/Paste - Reactive hypoglycemia - Cyclical vomiting syndrome - Slow transit constipation - Enlarged gallbladder - Non-alcoholic fatty liver disease - Peripheral artery disease, mild, more on the left than the right - Venous insufficiency with leaky valves in the legs - Tachycardia - History of anorexia - Atelectasis versus scarring on CT scan  Surgical History: - Bariatric surgery Medications Copy/Paste - Atenolol Physical Exam CARDIOVASCULAR: Peripheral pulses palpable. Results LABS Continuous Blood Glucose Monitor: Hypoglycemia, fasting glucose not under 100 mg/dL  RADIOLOGY Abdominal CT: Slow transit, fecal matter in small bowel, enlarged gallbladder, hepatic steatosis Cardiac MRI: No significant findings  DIAGNOSTIC Ankle-Brachial Index: Mild peripheral artery disease is possible, more pronounced on the left side EKG: No changes Assessment & Plan Reactive hypoglycemia after bariatric surgery Reactive hypoglycemia post-bariatric surgery with concerns about Dexcom monitor anxiety and potential eating disorder relapse. Discussed GLP-1 agonists, concerns about GI side effects and insurance coverage. - Continue Dexcom monitor for 40 days. - Consider GLP-1 agonists if benefits outweigh risks and insurance approves. - Communicate concerns about information overload to endocrinologist.  Obesity status post bariatric surgery Weight loss of 6-7 pounds on a plant-based diet. Concerns about blood sugar spikes with plant-based proteins. - Continue plant-based diet. - Monitor weight and blood sugar levels.  Cyclical vomiting and chronic nausea Nausea and vomiting almost every other day. Concerns about exacerbation with GLP-1 agonists. Previous CT showed slow GI transit and enlarged  gallbladder.  Slow gastrointestinal transit and constipation CT showed slow GI transit with stools up to the small bowel. Concerns about enlarged gallbladder and fatty liver.  Fatty liver and enlarged gallbladder CT showed fatty liver and enlarged gallbladder. Concerns about gallstone risk with GLP-1 agonists.  Recurrent intertrigo/yeast infection Recurrent intertrigo exacerbated by heat and humidity. Moisture barrier ointment helps manage symptoms. - Prescribe nystatin  ointment.  Peripheral venous insufficiency of lower extremities Previous venous study showed leaky valves. Experiencing swelling and redness with activity.  Peripheral artery disease, mild, left greater than right Mild PAD, more pronounced on the left. ABI slightly decreased, possibly due to elevated BP during testing.  Tachycardia, paroxysmal Experiencing episodes of tachycardia. New breakthrough medication prescribed but not yet used.  Chronic pain in lower extremities Chronic pain in calves and upper thighs. Possible contribution from venous insufficiency and PAD.  Chronic low oxygen saturation and atelectasis versus pulmonary scarring Oxygen saturation in mid-90s. CT showed atelectasis versus scarring. Pain may contribute to shallow breathing. - Encourage deep breathing exercises.  Eating disorder (anorexia) Concerns about potential relapse due to Dexcom monitor use and dietary changes. Discussed psychological impact of monitoring and dietary restrictions. - Communicate concerns about eating disorder history to endocrinologist.  Chronic fatigue and malaise Experiencing chronic fatigue and malaise. Attempting to increase activity levels gradually.  General Health Maintenance Discussion about vaccinations. Recommendation for flu and COVID vaccines. Consideration for pneumonia vaccine at age 18. - Administer flu vaccine. - Administer COVID vaccine. - Consider pneumonia vaccine at age 34. Morbid obesity-doing  vegetarian diet trying to lose weight With her history of GI side effects and symptoms not sure she would be a good candidate for GLP-1's if they were started by specialist I would highly recommend the lowest dose and gradually titrate up depending on tolerance  Follow-up Follow-up with endocrinologist in three months. Rheumatology referral scheduled for January. - Follow up with endocrinologist in three months. - Attend rheumatology appointment in January.  Very nice patient who is also a very complex patient.  Will follow-up again in 4 months. I will send communication to her endocrinologist.  Currently she is utilizing a continuous glucose monitor Although that can give useful information regarding low sugars at the same time she finds herself worried when sugars spike after eating healthy items She is trying a vegetarian diet She is concerned about stress associated with this as well as increased risk of developing anorexia again

## 2024-01-01 NOTE — Telephone Encounter (Signed)
 Patient returning call.

## 2024-01-01 NOTE — Telephone Encounter (Signed)
 Pt requesting a c/b to go over results.

## 2024-01-06 DIAGNOSIS — F411 Generalized anxiety disorder: Secondary | ICD-10-CM | POA: Diagnosis not present

## 2024-01-06 DIAGNOSIS — F331 Major depressive disorder, recurrent, moderate: Secondary | ICD-10-CM | POA: Diagnosis not present

## 2024-01-14 ENCOUNTER — Ambulatory Visit: Attending: Obstetrics and Gynecology

## 2024-01-14 DIAGNOSIS — R293 Abnormal posture: Secondary | ICD-10-CM | POA: Insufficient documentation

## 2024-01-14 DIAGNOSIS — R102 Pelvic and perineal pain: Secondary | ICD-10-CM | POA: Diagnosis not present

## 2024-01-14 DIAGNOSIS — R279 Unspecified lack of coordination: Secondary | ICD-10-CM | POA: Diagnosis not present

## 2024-01-14 DIAGNOSIS — M5459 Other low back pain: Secondary | ICD-10-CM | POA: Insufficient documentation

## 2024-01-14 DIAGNOSIS — M25552 Pain in left hip: Secondary | ICD-10-CM | POA: Insufficient documentation

## 2024-01-14 DIAGNOSIS — M6281 Muscle weakness (generalized): Secondary | ICD-10-CM | POA: Insufficient documentation

## 2024-01-14 DIAGNOSIS — M62838 Other muscle spasm: Secondary | ICD-10-CM | POA: Insufficient documentation

## 2024-01-14 NOTE — Therapy (Signed)
 OUTPATIENT PHYSICAL THERAPY TREATMENT NOTE   Patient Name: Katelyn [name] MRN: [id number] DOB:06-15-1975, 48 y.o., female Today's Date: 01/14/2024  PCP: Alphonsa Glendia LABOR, MD  REFERRING PROVIDER: Jeralyn Crutch, MD   END OF SESSION:   PT End of Session - 01/14/24 0756     Visit Number 9    Date for PT Re-Evaluation 02/17/24    Authorization Type HTA    Progress Note Due on Visit 10    PT Start Time 0800    PT Stop Time 0840    PT Time Calculation (min) 40 min    Activity Tolerance Patient tolerated treatment well    Behavior During Therapy [facility] for tasks assessed/performed                   Past Medical History:  Diagnosis Date   Anemia    Anorexia    Anxiety    on meds   Asthma    controlled with meds   Benign juvenile melanoma    Chronic headaches    Colon polyps    found on colonoscopy 04/26/2012   Complication of anesthesia    itching after epidural for c section   Constipation    Depression    on meds   Depression    several suicide attempts, hospitaluzed in 2012 for this; hx pf ECT treatments ; pt sees Dr. Curry pyschiatrist  and doing well on medication   Dysrhythmia    hx of PVC's (pt hasn't seen cardiologist since 2021- no follow up needed)   GERD (gastroesophageal reflux disease)    Headache    Heart murmur    as a child - no one mentions hearing murmur anymore, sometimes it is heard. Echo has been performed 02/03/18   Heartburn    no meds   History of pneumonia    x 3  years ago - no recent problems   Hx of blood clots 2019   bilateral LE   Hypertension    Left renal atrophy 12/19/2021   LEFT   Neuromuscular disorder (HCC)    small fiber neuropathy   Obesity    Pneumonia    Polycystic ovary    takes metformin  to treat   Skin lesion    Excisional biopsy of moles - none cancerous   Sleep apnea    yrs ago - diagnosed mild sleep apnea - did not have to use cpap    Past Surgical History:  Procedure Laterality Date   BREAST  CAPSULECTOMY Bilateral 12/13/2020   1 week later pt had hematoma surgery on left breast   BREAST ENHANCEMENT SURGERY Bilateral 02/11/2017   BREATH TEK H PYLORI N/A 07/22/2013   Procedure: BREATH TEK H PYLORI;  Surgeon: Morene ONEIDA Olives, MD;  Location: [facility] END[facility]COPY;  Service: General;  Laterality: N/A;   CESAREAN SECTION  2004, 2007   x 2   COLONOSCOPY  2013   COLONOSCOPY  2021   MS-F/V-sutab (good)-[facility]   CYSTOSCOPY WITH URETHRAL DILATATION  age 76    GASTRIC ROUX-EN-Y N/A 10/31/2013   Procedure: LAPAROSCOPIC ROUX-EN-Y GASTRIC BYPASS WITH UPPER ENDOSCOPY;  Surgeon: Morene ONEIDA Olives, MD;  Location: [facility] ORS;  Service: General;  Laterality: N/A;   GLUTEUS MINIMUS REPAIR Left 05/28/2021   Procedure: Left GLUTEUS Medius REPAIR;  Surgeon: Genelle Standing, MD;  Location: [facility] OR;  Service: Orthopedics;  Laterality: Left;   ingrown toenail Right    Ingrown nail on great right toe   kidney stent  08/28/2014  mole excision     benign juvenile melanoma removed from left leg - inner thigh   POLYPECTOMY     2013   SKIN LESION EXCISION     back   TONSILLECTOMY  age 29   TUBAL LIGATION  2018   Patient Active Problem List   Diagnosis Date Noted   Abnormal thyroid  blood test 12/22/2023   Mixed hyperlipidemia 12/22/2023   Essential hypertension 11/04/2022   Panic disorder (episodic paroxysmal anxiety) 07/10/2022   Major depressive disorder, recurrent, moderate (HCC) 07/10/2022   Left renal atrophy 12/19/2021   Stage 2 chronic kidney disease 11/20/2021   GERD (gastroesophageal reflux disease) 09/27/2021   Chronic pain syndrome 09/27/2021   Asthma 07/18/2020   Chronic myofascial pain 03/18/2018   Small fiber neuropathy 03/18/2018   Idiopathic small fiber sensory neuropathy 02/23/2018   Varicose veins of both lower extremities 01/13/2018   Cervical spondylosis without myelopathy 08/28/2017   Migraine with aura and without status migrainosus, not intractable 07/22/2017   Lumbar degenerative  disc disease 07/20/2017   Spondylosis of lumbar spine 07/20/2017   Coccydynia 03/12/2017   Status post bariatric surgery 05/02/2016   Tachy-brady syndrome (HCC) 12/19/2015   Iron  deficiency anemia 06/10/2015   Hypoglycemia, unspecified 04/16/2015   Morbid obesity (HCC) 06/24/2013   Sleep disorder 06/01/2013   Generalized anxiety disorder 05/30/2013   Depression, major, recurrent, severe with psychosis (HCC) 06/17/2012   Eating disorder 12/18/2011   Constipation 01/20/2011   Polycystic ovarian syndrome 09/01/2008    REFERRING DIAG: R10.2,G89.29 (ICD-10-CM) - Chronic pelvic pain in female  THERAPY DIAG:  Muscle weakness (generalized)  Other muscle spasm  Unspecified lack of coordination  Abnormal posture  Other low back pain  Pelvic pain  Pain in left hip  Rationale for Evaluation and Treatment Rehabilitation  PERTINENT HISTORY: Breast augmentation surgeries with follow-up reduction with complication, c/s X 2, gastric bypass, bil tubal ligation, long history of chronic low back/pelvic pain, pudendal neuralgia, coccydynia, Lt glute med repair, Lt ovarian cyst, pelvic tumor, small fiber neuropathy, Lt hip OA   PRECAUTIONS: extreme tachycardia, no standing exercise - monitor heart rate, pelvic tumor without biopsy, possible cancer? No dx at this point - just watching blood levels   SUBJECTIVE:                                                                                                                                                                                      SUBJECTIVE STATEMENT:   Pt saw endocrinology and was put on plant based diet; MD thought this might solve all of her issues. However, this has made her not feel well and she has stopped performing. She  does not have blood clots in LE, but found to have some peripheral artery disease in LE. She sees rheumatology in December. She is working hard on being more active, stretching more, and eating a healthy diet.    Problem list: -pudendal neuroalgia/intermittent labial pain -Bil hip and low back pain -being unable to sit -deconditioning -urinary urgency/leaking -constipation  -dyspareunia    PAIN: 01/14/24 PAIN:  Are you having pain? Yes NPRS scale: 9/10 12/11/23: pain level 8/10 Pain location: vagina/labia, low back, hips, lower abdomen Pain orientation: Bilateral  PAIN TYPE: aching, burning, sharp, throbbing, tight, and tingling Pain description: constant  Aggravating factors: not exercising Relieving factors: alternating sides, lying on stomach, small amounts of standing     PRECAUTIONS: None   WEIGHT BEARING RESTRICTIONS: No   FALLS:  Has patient fallen in last 6 months? No   LIVING ENVIRONMENT: Lives with: lives with their family Lives in: House/apartment   OCCUPATION: disabled   PLOF: Independent   PATIENT GOALS: would like to be able to sit; decrease bil hip/low back pain    BOWEL MOVEMENT: Pain with bowel movement: No Type of bowel movement:Frequency 1x/day to every other day and Strain Yes Fully empty rectum: No Leakage: No Pads: No Fiber supplement: No no medication to help stimulate bowel movements   URINATION: Pain with urination: No Fully empty bladder: No Stream:  trouble starting the stream - worse at night Urgency: yes Frequency: every 30 minutes to an hour Leakage:  Laugh, cough, sneeze Pads: No due to irritation    INTERCOURSE: Pain with intercourse: Initial Penetration and During Penetration Ability to have vaginal penetration:  Yes: pain  Climax: yes Marinoff Scale: 2/3   PREGNANCY: Vaginal deliveries 0 Tearing No C-section deliveries 2 Currently pregnant No       OBJECTIVE:  11/25/23: PFIQ-7: 90  FUNCTIONAL TESTS:  Single leg stance:  Rt: pelvic drop with UE support  Lt: pelvic drop with UE support 5x Sit<>stand: 37 seconds and HR went up to 151  LUMBARAROM/PROM:  A/PROM A/PROM  Eval (% available)  Flexion 35   Extension 40  Right lateral flexion 75  Left lateral flexion 75  Right rotation 60  Left rotation 60   (Blank rows = not tested)  PALPATION: signficiant tightness throughout posterior hips and bil lumbar paraspinals  HIP ROM: improving hip flexion with less reproduction of low back pain  09/03/23:  PATIENT SURVEYS:   PFIQ-7: 100  COGNITION: Overall cognitive status: Within functional limits for tasks assessed     SENSATION: Light touch: Appears intact  FUNCTIONAL TESTS:  Single leg stance:  Rt: pelvic drop with UE support  Lt: pelvic drop with UE support  5xSTS: 36 seconds  GAIT: Assistive device utilized: None Comments: WNL  POSTURE: No Significant postural limitations   LUMBARAROM/PROM:  A/PROM A/PROM  Eval (% available)  Flexion 25  Extension 20  Right lateral flexion 50  Left lateral flexion 50  Right rotation 50  Left rotation 50   (Blank rows = not tested)  PALPATION:   General: lumbar paraspinal and oblique muscle restriction  Pelvic Alignment: Lt iliac crest elevation, significant Rt posterior rotation   Abdominal: lower abdominal tenderness                 External Perineal Exam: redness and irritation equal bil                             Internal Pelvic  Floor: sharp, tender, pain throughout superficial muscles; achy and restricted in bil levator ani with more pain on the Rt compared to the Lt; dryness; paradoxical pelvic floor bearing down  Patient confirms identification and approves PT to assess internal pelvic floor and treatment Yes  PELVIC MMT:   MMT eval  Vaginal 1/5, 5 second hold, 2 repeat contractions - very difficult time with coordination  (Blank rows = not tested)        TONE: High with pain  PROLAPSE: None noted today; she did have paradoxical bearing down contraction so may not have been able to detect     TODAY'S TREATMENT  01/14/24 Manual: Soft tissue mobilization: Prone rhythmic rotation of bil LE 20x  bil Manual work to the quadratus trigger  point bilaterally for a release Myofascial release: Fascial release of the sacrum to the low thoracic pulling the tissue apart gently  Spinal mobilization: Sacral and L1-5 PA mobilizations grade 1 Passive stretching Bil hip flexors Bil external rotators  Exercises: Doorway pec stretch 2 x 20 seconds bil Standing overhead press with no weight unilateral 5x bil Scapular squeezes 5x Rows + yellow band at door 5x Posterior shoulder rolls 10x   12/11/23 Manual: Soft tissue mobilization: Prone rhythmic rotation of bil LE 20x bil Manual work to the quadratus trigger  point bilaterally for a release Myofascial release: Fascial release of the sacrum to the low thoracic pulling the tissue apart gently  Spinal mobilization: Sacral and L1-5 PA mobilizations grade 1 Exercises: Strengthening: Prone: Glute set: 5 x 10 second holds Hip IR set: 5 x 10 second holds ball squeeze at ankles Hip ER set: 5 x 10 second holds out against red band with knee flexion Hip abduction set: 5 x 10 second holds out against red band with straight LE    11/25/23 RE-EVAL Manual: Myofascial release to thoracic and lumbar spine and paraspinals Prone rhythmic rotation of bil LE 20x bil Sacral and L1-5 PA mobilizations grade 1 Soft tissue mobilization to Rt lateral coccyx border and piriformis  Exercises: Prone P/ROM to bil hips focusing on internal and external rotation in hip flexion; included bil hip flexion today as well     PATIENT EDUCATION:  09/28/23 Education details: Access Code: [email or web address] Person educated: Patient Education method: Explanation, Demonstration, Tactile cues, Verbal cues, and Handouts Education comprehension: verbalized understanding   HOME EXERCISE PROGRAM: 09/28/23 Access Code: [email or web address] URL: [email or web address] Date: 09/28/2023 Prepared by: Channing Pereyra  Exercises - Prone Diaphragmatic Breathing  - 2 x daily - 7 x weekly  - 1 sets - 10 reps - Prone Hip External Rotation AROM  - 1 x daily - 7 x weekly - 1 sets - 10 reps   ASSESSMENT:   CLINICAL IMPRESSION: Pt is a 48 year old female with chief complaints of pelvic pain, pudendal neuralgia, inability to sit, low back pain, bowel/bladder dysfunction, and dyspareunia. Pt doing better overall with improving activity levels and trying to be more active. Manual techniques continued to help with myofascial restriction and pain management; she reports they continue to be very helpful. We progressed gentle upper body strengthening to help improve posture and rib cage/diaphragm position. Good improvements of pain at end of session. She will continue to benefit from skilled PT intervention in order to decrease pain, improve functional ability specifically sitting tolerance, increase ease of starting stream/emptying bladder, decrease dyspareunia, and improve QOL.    OBJECTIVE IMPAIRMENTS: decreased activity tolerance, decreased coordination, decreased endurance, decreased mobility, decreased ROM, decreased strength, hypomobility, increased  fascial restrictions, increased muscle spasms, impaired flexibility, impaired tone, postural dysfunction, and pain.    ACTIVITY LIMITATIONS: bending, sitting, standing, stairs, transfers, bed mobility, and locomotion level, vaginal penetration   PARTICIPATION LIMITATIONS: cleaning, laundry, interpersonal relationship, driving, and community activity   PERSONAL FACTORS: 3+ comorbidities: Breast augmentation surgeries with follow-up reduction with complication, c/s X 2, gastric bypass, bil tubal ligation, long history of chronic low back/pelvic pain, pudendal neuralgia, coccydynia, Lt glute med repair, Lt ovarian cyst, pelvic tumor, small fiber neuropathy, Lt hip OA  are also affecting patient's functional outcome.    REHAB POTENTIAL: Good   CLINICAL DECISION MAKING: Stable/uncomplicated   EVALUATION COMPLEXITY: Low     GOALS: Goals  reviewed with patient? Yes   SHORT TERM GOALS: Updated 01/14/24   Pt will be independent with HEP.    Baseline: Goal status: MET 11/03/23   2.  Pt will be independent with diaphragmatic breathing and down training activities in order to improve pelvic floor relaxation and abdominal wall mobility.   Baseline:  Goal status: IN PROGRESS 01/14/24   3.  Pt will report improve stream initiation and complete voiding with urination in order to decrease urinary leaking that causes skin irritation. Baseline:  Goal status: IN PROGRESS 01/14/24              4.  Pt will increase all impaired lumbar A/ROM by 25% without pain in order to decrease pelvic pain and improve pelvic floor muscle length tension relationship.   Baseline: good progress with exception of flexion which remains most limited Goal status: IN PROGRESS 01/14/24  5. Pt will stop straining with bowel movements to decrease strain on pelvic floor muscles in order to reduce pelvic pain, allowing her to sit for longer periods of time.   Baseline: she reports some improvements  Goal status: IN PROGRESS 01/14/24   LONG TERM GOALS: Updated 01/14/24   Pt will be independent with advanced HEP.    Baseline:  Goal status:IN PROGRESS 01/14/24   2.  Pt will be able to have intercourse with no greater than 5/10 pain in order to maintain intimate relationship with partner.  Baseline: has not had intercourse Goal status: IN PROGRESS 01/14/24   3.  Pt will be able to ascend/descend 12 steps at regular intervals throughout the day safely and with minimal difficulty in order to perform household chores.  Baseline: increased ease with 2 steps  Goal status: IN PROGRESS 01/14/24   4.  Pt will improve 5xSTS to less than 20 seconds and improved PFIQ by 25% in order to demonstrate improved QOL and functional ability.  Baseline: 37 seconds today with bil UE use Goal status: IN PROGRESS 01/14/24   5.  Patient will report no greater than 5/10 pain with  sitting in order to more easily attend appointments, drive, and spend time with family.  Baseline: reached 5/10 for the first time today after session Goal status: IN PROGRESS 01/14/24   6.  Pt will be able to sit for >30 minutes in order to allow heart rate to remain lower and perform seated exercise.    Baseline: no sitting tolerance Goal status: IN PROGRESS 01/14/24  7. Pt will be able to stand on one leg in order to put on pants on without pain or difficulty.    Baseline: able to, but pelvic drop Goal status: IN PROGRESS 01/14/24   PLAN:   PT FREQUENCY: 1x/week   PT DURATION: 12 weeks   PLANNED INTERVENTIONS: Therapeutic exercises, Therapeutic activity,  Neuromuscular re-education, Balance training, Gait training, Patient/Family education, Self Care, Joint mobilization, Aquatic Therapy, Dry Needling, Biofeedback, and Manual therapy   PLAN FOR NEXT SESSION: Internal manual techniques to help improve pelvic floor muscle spasm and pain. Work on pelvic alignment. Seems to be a candidate for cranio sacral work, she responds to fascial work, worked around the uterus to reduce restrictions   Josette Mares, PT, DPT09/18/258:42 AM

## 2024-01-21 ENCOUNTER — Encounter

## 2024-01-21 DIAGNOSIS — D1801 Hemangioma of skin and subcutaneous tissue: Secondary | ICD-10-CM | POA: Diagnosis not present

## 2024-01-21 DIAGNOSIS — D224 Melanocytic nevi of scalp and neck: Secondary | ICD-10-CM | POA: Diagnosis not present

## 2024-01-21 DIAGNOSIS — D235 Other benign neoplasm of skin of trunk: Secondary | ICD-10-CM | POA: Diagnosis not present

## 2024-01-21 DIAGNOSIS — D225 Melanocytic nevi of trunk: Secondary | ICD-10-CM | POA: Diagnosis not present

## 2024-01-21 DIAGNOSIS — D2262 Melanocytic nevi of left upper limb, including shoulder: Secondary | ICD-10-CM | POA: Diagnosis not present

## 2024-01-21 DIAGNOSIS — D2271 Melanocytic nevi of right lower limb, including hip: Secondary | ICD-10-CM | POA: Diagnosis not present

## 2024-01-21 DIAGNOSIS — B372 Candidiasis of skin and nail: Secondary | ICD-10-CM | POA: Diagnosis not present

## 2024-01-25 ENCOUNTER — Ambulatory Visit

## 2024-01-25 DIAGNOSIS — M5459 Other low back pain: Secondary | ICD-10-CM

## 2024-01-25 DIAGNOSIS — M25552 Pain in left hip: Secondary | ICD-10-CM

## 2024-01-25 DIAGNOSIS — M6281 Muscle weakness (generalized): Secondary | ICD-10-CM

## 2024-01-25 DIAGNOSIS — M62838 Other muscle spasm: Secondary | ICD-10-CM

## 2024-01-25 DIAGNOSIS — R102 Pelvic and perineal pain: Secondary | ICD-10-CM

## 2024-01-25 DIAGNOSIS — R293 Abnormal posture: Secondary | ICD-10-CM

## 2024-01-25 DIAGNOSIS — R279 Unspecified lack of coordination: Secondary | ICD-10-CM

## 2024-01-25 NOTE — Therapy (Signed)
 OUTPATIENT PHYSICAL THERAPY TREATMENT NOTE  Progress Note Reporting Period 09/03/2023 to 01/25/2024  See note below for Objective Data and Assessment of Progress/Goals.     Patient Name: Katelyn [name] MRN: [id number] DOB:11/10/1975, 48 y.o., female Today's Date: 01/25/2024  PCP: Alphonsa Glendia LABOR, MD  REFERRING PROVIDER: Jeralyn Crutch, MD   END OF SESSION:   PT End of Session - 01/25/24 0757     Visit Number 10    Date for Recertification  02/17/24    Authorization Type HTA    Authorization - Number of Visits 9    Progress Note Due on Visit 10    PT Start Time 0800    PT Stop Time 0840    PT Time Calculation (min) 40 min    Activity Tolerance Patient tolerated treatment well    Behavior During Therapy [facility] for tasks assessed/performed                   Past Medical History:  Diagnosis Date   Anemia    Anorexia    Anxiety    on meds   Asthma    controlled with meds   Benign juvenile melanoma    Chronic headaches    Colon polyps    found on colonoscopy 04/26/2012   Complication of anesthesia    itching after epidural for c section   Constipation    Depression    on meds   Depression    several suicide attempts, hospitaluzed in 2012 for this; hx pf ECT treatments ; pt sees Dr. Curry pyschiatrist  and doing well on medication   Dysrhythmia    hx of PVC's (pt hasn't seen cardiologist since 2021- no follow up needed)   GERD (gastroesophageal reflux disease)    Headache    Heart murmur    as a child - no one mentions hearing murmur anymore, sometimes it is heard. Echo has been performed 02/03/18   Heartburn    no meds   History of pneumonia    x 3  years ago - no recent problems   Hx of blood clots 2019   bilateral LE   Hypertension    Left renal atrophy 12/19/2021   LEFT   Neuromuscular disorder (HCC)    small fiber neuropathy   Obesity    Pneumonia    Polycystic ovary    takes metformin  to treat   Skin lesion    Excisional biopsy of  moles - none cancerous   Sleep apnea    yrs ago - diagnosed mild sleep apnea - did not have to use cpap    Past Surgical History:  Procedure Laterality Date   BREAST CAPSULECTOMY Bilateral 12/13/2020   1 week later pt had hematoma surgery on left breast   BREAST ENHANCEMENT SURGERY Bilateral 02/11/2017   BREATH TEK H PYLORI N/A 07/22/2013   Procedure: BREATH TEK H PYLORI;  Surgeon: Morene ONEIDA Olives, MD;  Location: [facility];  Service: General;  Laterality: N/A;   CESAREAN SECTION  2004, 2007   x 2   COLONOSCOPY  2013   COLONOSCOPY  2021   MS-F/V-sutab (good)-[facility]   CYSTOSCOPY WITH URETHRAL DILATATION  age 62    GASTRIC ROUX-EN-Y N/A 10/31/2013   Procedure: LAPAROSCOPIC ROUX-EN-Y GASTRIC BYPASS WITH UPPER ENDOSCOPY;  Surgeon: Morene ONEIDA Olives, MD;  Location: [facility];  Service: General;  Laterality: N/A;   GLUTEUS MINIMUS REPAIR Left 05/28/2021   Procedure: Left GLUTEUS Medius REPAIR;  Surgeon: Genelle Standing, MD;  Location:  [facility] OR;  Service: Orthopedics;  Laterality: Left;   ingrown toenail Right    Ingrown nail on great right toe   kidney stent  08/28/2014   mole excision     benign juvenile melanoma removed from left leg - inner thigh   POLYPECTOMY     2013   SKIN LESION EXCISION     back   TONSILLECTOMY  age 70   TUBAL LIGATION  2018   Patient Active Problem List   Diagnosis Date Noted   Abnormal thyroid  blood test 12/22/2023   Mixed hyperlipidemia 12/22/2023   Essential hypertension 11/04/2022   Panic disorder (episodic paroxysmal anxiety) 07/10/2022   Major depressive disorder, recurrent, moderate (HCC) 07/10/2022   Left renal atrophy 12/19/2021   Stage 2 chronic kidney disease 11/20/2021   GERD (gastroesophageal reflux disease) 09/27/2021   Chronic pain syndrome 09/27/2021   Asthma 07/18/2020   Chronic myofascial pain 03/18/2018   Small fiber neuropathy 03/18/2018   Idiopathic small fiber sensory neuropathy 02/23/2018   Varicose veins of both lower  extremities 01/13/2018   Cervical spondylosis without myelopathy 08/28/2017   Migraine with aura and without status migrainosus, not intractable 07/22/2017   Lumbar degenerative disc disease 07/20/2017   Spondylosis of lumbar spine 07/20/2017   Coccydynia 03/12/2017   Status post bariatric surgery 05/02/2016   Tachy-brady syndrome (HCC) 12/19/2015   Iron  deficiency anemia 06/10/2015   Hypoglycemia, unspecified 04/16/2015   Morbid obesity (HCC) 06/24/2013   Sleep disorder 06/01/2013   Generalized anxiety disorder 05/30/2013   Depression, major, recurrent, severe with psychosis (HCC) 06/17/2012   Eating disorder 12/18/2011   Constipation 01/20/2011   Polycystic ovarian syndrome 09/01/2008    REFERRING DIAG: R10.2,G89.29 (ICD-10-CM) - Chronic pelvic pain in female  THERAPY DIAG:  Muscle weakness (generalized)  Other muscle spasm  Unspecified lack of coordination  Abnormal posture  Other low back pain  Pelvic pain  Pain in left hip  Rationale for Evaluation and Treatment Rehabilitation  PERTINENT HISTORY: Breast augmentation surgeries with follow-up reduction with complication, c/s X 2, gastric bypass, bil tubal ligation, long history of chronic low back/pelvic pain, pudendal neuralgia, coccydynia, Lt glute med repair, Lt ovarian cyst, pelvic tumor, small fiber neuropathy, Lt hip OA   PRECAUTIONS: extreme tachycardia, no standing exercise - monitor heart rate, pelvic tumor without biopsy, possible cancer? No dx at this point - just watching blood levels   SUBJECTIVE:                                                                                                                                                                                      SUBJECTIVE STATEMENT:   Pt feeling very  dizzy today and states that her blood pressure has been low. She has communicated with doctor. She is not driving to sessions. She has been having some pain with UE exercises at home.   Problem  list: -pudendal neuroalgia/intermittent labial pain -Bil hip and low back pain -being unable to sit -deconditioning -urinary urgency/leaking -constipation  -dyspareunia    PAIN: 01/25/24 PAIN:  Are you having pain? Yes NPRS scale: 10/10 12/11/23: pain level 8/10 Pain location: vagina/labia, low back, hips, lower abdomen Pain orientation: Bilateral  PAIN TYPE: aching, burning, sharp, throbbing, tight, and tingling Pain description: constant  Aggravating factors: not exercising Relieving factors: alternating sides, lying on stomach, small amounts of standing     PRECAUTIONS: None   WEIGHT BEARING RESTRICTIONS: No   FALLS:  Has patient fallen in last 6 months? No   LIVING ENVIRONMENT: Lives with: lives with their family Lives in: House/apartment   OCCUPATION: disabled   PLOF: Independent   PATIENT GOALS: would like to be able to sit; decrease bil hip/low back pain    BOWEL MOVEMENT: Pain with bowel movement: No Type of bowel movement:Frequency 1x/day to every other day and Strain Yes Fully empty rectum: No Leakage: No Pads: No Fiber supplement: No no medication to help stimulate bowel movements   URINATION: Pain with urination: No Fully empty bladder: No Stream:  trouble starting the stream - worse at night Urgency: yes Frequency: every 30 minutes to an hour Leakage:  Laugh, cough, sneeze Pads: No due to irritation    INTERCOURSE: Pain with intercourse: Initial Penetration and During Penetration Ability to have vaginal penetration:  Yes: pain  Climax: yes Marinoff Scale: 2/3   PREGNANCY: Vaginal deliveries 0 Tearing No C-section deliveries 2 Currently pregnant No       OBJECTIVE:  01/25/24 PFIQ-7:  11/25/23: PFIQ-7: 90  FUNCTIONAL TESTS:  Single leg stance:  Rt: pelvic drop with UE support  Lt: pelvic drop with UE support 5x Sit<>stand: 37 seconds and HR went up to 151  LUMBARAROM/PROM:  A/PROM A/PROM  Eval (% available)  Flexion 35   Extension 40  Right lateral flexion 75  Left lateral flexion 75  Right rotation 60  Left rotation 60   (Blank rows = not tested)  PALPATION: signficiant tightness throughout posterior hips and bil lumbar paraspinals  HIP ROM: improving hip flexion with less reproduction of low back pain  09/03/23:  PATIENT SURVEYS:   PFIQ-7: 100  COGNITION: Overall cognitive status: Within functional limits for tasks assessed     SENSATION: Light touch: Appears intact  FUNCTIONAL TESTS:  Single leg stance:  Rt: pelvic drop with UE support  Lt: pelvic drop with UE support  5xSTS: 36 seconds  GAIT: Assistive device utilized: None Comments: WNL  POSTURE: No Significant postural limitations   LUMBARAROM/PROM:  A/PROM A/PROM  Eval (% available)  Flexion 25  Extension 20  Right lateral flexion 50  Left lateral flexion 50  Right rotation 50  Left rotation 50   (Blank rows = not tested)  PALPATION:   General: lumbar paraspinal and oblique muscle restriction  Pelvic Alignment: Lt iliac crest elevation, significant Rt posterior rotation   Abdominal: lower abdominal tenderness                 External Perineal Exam: redness and irritation equal bil                             Internal Pelvic Floor:  sharp, tender, pain throughout superficial muscles; achy and restricted in bil levator ani with more pain on the Rt compared to the Lt; dryness; paradoxical pelvic floor bearing down  Patient confirms identification and approves PT to assess internal pelvic floor and treatment Yes  PELVIC MMT:   MMT eval  Vaginal 1/5, 5 second hold, 2 repeat contractions - very difficult time with coordination  (Blank rows = not tested)        TONE: High with pain  PROLAPSE: None noted today; she did have paradoxical bearing down contraction so may not have been able to detect     TODAY'S TREATMENT  01/25/24 Manual: Soft tissue mobilization: Prone rhythmic rotation of bil LE 20x  bil Manual work to the quadratus trigger  point bilaterally for a release Myofascial release: Fascial release of the sacrum to the low thoracic pulling the tissue apart gently  Spinal mobilization: Sacral and L1-5 PA mobilizations grade 1 Exercises: Passive stretching in prone Bil hip flexors Bil external rotators    01/14/24 Manual: Soft tissue mobilization: Prone rhythmic rotation of bil LE 20x bil Manual work to the quadratus trigger  point bilaterally for a release Myofascial release: Fascial release of the sacrum to the low thoracic pulling the tissue apart gently  Spinal mobilization: Sacral and L1-5 PA mobilizations grade 1 Passive stretching Bil hip flexors Bil external rotators  Exercises: Doorway pec stretch 2 x 20 seconds bil Standing overhead press with no weight unilateral 5x bil Scapular squeezes 5x Rows + yellow band at door 5x Posterior shoulder rolls 10x   12/11/23 Manual: Soft tissue mobilization: Prone rhythmic rotation of bil LE 20x bil Manual work to the quadratus trigger  point bilaterally for a release Myofascial release: Fascial release of the sacrum to the low thoracic pulling the tissue apart gently  Spinal mobilization: Sacral and L1-5 PA mobilizations grade 1 Exercises: Strengthening: Prone: Glute set: 5 x 10 second holds Hip IR set: 5 x 10 second holds ball squeeze at ankles Hip ER set: 5 x 10 second holds out against red band with knee flexion Hip abduction set: 5 x 10 second holds out against red band with straight LE      PATIENT EDUCATION:  09/28/23 Education details: Access Code: [email or web address] Person educated: Patient Education method: Explanation, Demonstration, Tactile cues, Verbal cues, and Handouts Education comprehension: verbalized understanding   HOME EXERCISE PROGRAM: 09/28/23 Access Code: [email or web address] URL: [email or web address] Date: 09/28/2023 Prepared by: Channing Pereyra  Exercises - Prone Diaphragmatic  Breathing  - 2 x daily - 7 x weekly - 1 sets - 10 reps - Prone Hip External Rotation AROM  - 1 x daily - 7 x weekly - 1 sets - 10 reps   ASSESSMENT:   CLINICAL IMPRESSION: Pt is a 48 year old female with chief complaints of pelvic pain, pudendal neuralgia, inability to sit, low back pain, bowel/bladder dysfunction, and dyspareunia. Pt very painful today with most specific pain bil L4-S1. Believe this may be due to such extensive time spent in prone. We discussed making she she is performing glute sets, pelvic tilts, lying on side with bil knees flexed up to chest, and trying to spend some time in child's pose. She tolerated all treatment well with some decrease in pain, but was very dizzy at end of session. She was ok in standing, but did have to have husband come in to help walk her out. Patient was seen to car safely. She will continue to benefit from skilled PT intervention in  order to decrease pain, improve functional ability specifically sitting tolerance, increase ease of starting stream/emptying bladder, decrease dyspareunia, and improve QOL.    OBJECTIVE IMPAIRMENTS: decreased activity tolerance, decreased coordination, decreased endurance, decreased mobility, decreased ROM, decreased strength, hypomobility, increased fascial restrictions, increased muscle spasms, impaired flexibility, impaired tone, postural dysfunction, and pain.    ACTIVITY LIMITATIONS: bending, sitting, standing, stairs, transfers, bed mobility, and locomotion level, vaginal penetration   PARTICIPATION LIMITATIONS: cleaning, laundry, interpersonal relationship, driving, and community activity   PERSONAL FACTORS: 3+ comorbidities: Breast augmentation surgeries with follow-up reduction with complication, c/s X 2, gastric bypass, bil tubal ligation, long history of chronic low back/pelvic pain, pudendal neuralgia, coccydynia, Lt glute med repair, Lt ovarian cyst, pelvic tumor, small fiber neuropathy, Lt hip OA  are also  affecting patient's functional outcome.    REHAB POTENTIAL: Good   CLINICAL DECISION MAKING: Stable/uncomplicated   EVALUATION COMPLEXITY: Low     GOALS: Goals reviewed with patient? Yes   SHORT TERM GOALS: Updated 01/25/24   Pt will be independent with HEP.    Baseline: Goal status: MET 11/03/23   2.  Pt will be independent with diaphragmatic breathing and down training activities in order to improve pelvic floor relaxation and abdominal wall mobility.   Baseline:  Goal status: IN PROGRESS 01/25/24   3.  Pt will report improve stream initiation and complete voiding with urination in order to decrease urinary leaking that causes skin irritation. Baseline:  Goal status: IN PROGRESS 01/25/24              4.  Pt will increase all impaired lumbar A/ROM by 25% without pain in order to decrease pelvic pain and improve pelvic floor muscle length tension relationship.   Baseline: good progress with exception of flexion which remains most limited Goal status: IN PROGRESS 01/25/24  5. Pt will stop straining with bowel movements to decrease strain on pelvic floor muscles in order to reduce pelvic pain, allowing her to sit for longer periods of time.   Baseline: she reports some improvements  Goal status: IN PROGRESS 01/25/24   LONG TERM GOALS: Updated 01/25/24   Pt will be independent with advanced HEP.    Baseline:  Goal status:IN PROGRESS 01/25/24   2.  Pt will be able to have intercourse with no greater than 5/10 pain in order to maintain intimate relationship with partner.  Baseline: has not had intercourse Goal status: IN PROGRESS 01/25/24   3.  Pt will be able to ascend/descend 12 steps at regular intervals throughout the day safely and with minimal difficulty in order to perform household chores.  Baseline: can go up and down steps 1x/day, but does have increased pain Goal status: IN PROGRESS 01/25/24   4.  Pt will improve 5xSTS to less than 20 seconds and improved PFIQ by 25% in  order to demonstrate improved QOL and functional ability.  Baseline: 37 seconds today with bil UE use - not updated today due to feeling dizzy Goal status: IN PROGRESS 01/25/24   5.  Patient will report no greater than 5/10 pain with sitting in order to more easily attend appointments, drive, and spend time with family.  Baseline: pt in 10/10 pain today Goal status: IN PROGRESS 01/25/24   6.  Pt will be able to sit for >30 minutes in order to allow heart rate to remain lower and perform seated exercise.    Baseline: no sitting tolerance; she can sit for short period of time, but less than 5  minutes  Goal status: IN PROGRESS 01/25/24  7. Pt will be able to stand on one leg in order to put on pants on without pain or difficulty.    Baseline: able to, but pelvic drop; continues to have pelvic drop Goal status: IN PROGRESS 01/25/24   PLAN:   PT FREQUENCY: 1x/week   PT DURATION: 12 weeks   PLANNED INTERVENTIONS: Therapeutic exercises, Therapeutic activity, Neuromuscular re-education, Balance training, Gait training, Patient/Family education, Self Care, Joint mobilization, Aquatic Therapy, Dry Needling, Biofeedback, and Manual therapy   PLAN FOR NEXT SESSION: Internal manual techniques to help improve pelvic floor muscle spasm and pain. Work on pelvic alignment. Seems to be a candidate for cranio sacral work, she responds to fascial work, worked around the uterus to reduce restrictions   Josette Mares, PT, DPT09/29/258:48 AM

## 2024-01-26 ENCOUNTER — Encounter: Payer: Self-pay | Admitting: Diagnostic Neuroimaging

## 2024-01-26 ENCOUNTER — Telehealth: Payer: Self-pay | Admitting: Diagnostic Neuroimaging

## 2024-01-26 ENCOUNTER — Telehealth: Admitting: Diagnostic Neuroimaging

## 2024-01-26 DIAGNOSIS — G43109 Migraine with aura, not intractable, without status migrainosus: Secondary | ICD-10-CM

## 2024-01-26 DIAGNOSIS — G43009 Migraine without aura, not intractable, without status migrainosus: Secondary | ICD-10-CM

## 2024-01-26 DIAGNOSIS — F411 Generalized anxiety disorder: Secondary | ICD-10-CM | POA: Diagnosis not present

## 2024-01-26 DIAGNOSIS — F331 Major depressive disorder, recurrent, moderate: Secondary | ICD-10-CM | POA: Diagnosis not present

## 2024-01-26 MED ORDER — QULIPTA 60 MG PO TABS: 60.0000 mg | ORAL_TABLET | Freq: Every day | ORAL | 6 refills | Status: AC

## 2024-01-26 NOTE — Telephone Encounter (Signed)
 Patient said she was on MyChart waiting since 1:30 pm and received call from nurse at 2pm due to Dr. Margaret running late. Patient said she could see him and hear him. Heard him say hello and  then Dr. Margaret hung up. She said she logged off then logged back on.  Messaged  Dr. Margaret, he said he would still be able to do MyChart Video. Instructed patient to close visit and restart. Patient closed and restarted and continued with MyChart Video Visit with Dr. Margaret.
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
## 2024-01-28 DIAGNOSIS — Z5181 Encounter for therapeutic drug level monitoring: Secondary | ICD-10-CM | POA: Diagnosis not present

## 2024-01-28 DIAGNOSIS — G8929 Other chronic pain: Secondary | ICD-10-CM | POA: Diagnosis not present

## 2024-01-28 DIAGNOSIS — G894 Chronic pain syndrome: Secondary | ICD-10-CM | POA: Diagnosis not present

## 2024-01-28 DIAGNOSIS — Z79891 Long term (current) use of opiate analgesic: Secondary | ICD-10-CM | POA: Diagnosis not present

## 2024-01-28 DIAGNOSIS — M461 Sacroiliitis, not elsewhere classified: Secondary | ICD-10-CM | POA: Diagnosis not present

## 2024-01-28 DIAGNOSIS — R102 Pelvic and perineal pain unspecified side: Secondary | ICD-10-CM | POA: Diagnosis not present

## 2024-01-28 DIAGNOSIS — M533 Sacrococcygeal disorders, not elsewhere classified: Secondary | ICD-10-CM | POA: Diagnosis not present

## 2024-02-01 ENCOUNTER — Telehealth: Payer: Self-pay

## 2024-02-01 ENCOUNTER — Other Ambulatory Visit (HOSPITAL_COMMUNITY): Payer: Self-pay

## 2024-02-01 ENCOUNTER — Encounter: Payer: Self-pay | Admitting: Oncology

## 2024-02-01 NOTE — Telephone Encounter (Signed)
 Received faxed PA form for Qulipta -will complete and fax.

## 2024-02-04 ENCOUNTER — Ambulatory Visit: Attending: Obstetrics and Gynecology

## 2024-02-04 DIAGNOSIS — R293 Abnormal posture: Secondary | ICD-10-CM | POA: Diagnosis not present

## 2024-02-04 DIAGNOSIS — M5459 Other low back pain: Secondary | ICD-10-CM | POA: Diagnosis not present

## 2024-02-04 DIAGNOSIS — R279 Unspecified lack of coordination: Secondary | ICD-10-CM | POA: Insufficient documentation

## 2024-02-04 DIAGNOSIS — R102 Pelvic and perineal pain unspecified side: Secondary | ICD-10-CM | POA: Diagnosis not present

## 2024-02-04 DIAGNOSIS — M6281 Muscle weakness (generalized): Secondary | ICD-10-CM | POA: Diagnosis not present

## 2024-02-04 DIAGNOSIS — M62838 Other muscle spasm: Secondary | ICD-10-CM | POA: Diagnosis not present

## 2024-02-04 DIAGNOSIS — M25552 Pain in left hip: Secondary | ICD-10-CM | POA: Insufficient documentation

## 2024-02-04 NOTE — Therapy (Signed)
 OUTPATIENT PHYSICAL THERAPY TREATMENT NOTE   Patient Name: Katelyn [name] MRN: [id number] DOB:Nov 12, 1975, 48 y.o., female Today's Date: 02/04/2024  PCP: Alphonsa Glendia LABOR, MD  REFERRING PROVIDER: Jeralyn Crutch, MD   END OF SESSION:   PT End of Session - 02/04/24 0805     Visit Number 11    Date for Recertification  02/17/24    Authorization Type HTA    Progress Note Due on Visit 20    PT Start Time 0803    PT Stop Time 0841    PT Time Calculation (min) 38 min    Activity Tolerance Patient tolerated treatment well    Behavior During Therapy [facility] for tasks assessed/performed                    Past Medical History:  Diagnosis Date   Anemia    Anorexia    Anxiety    on meds   Asthma    controlled with meds   Benign juvenile melanoma    Chronic headaches    Colon polyps    found on colonoscopy 04/26/2012   Complication of anesthesia    itching after epidural for c section   Constipation    Depression    on meds   Depression    several suicide attempts, hospitaluzed in 2012 for this; hx pf ECT treatments ; pt sees Dr. Arfeen pyschiatrist  and doing well on medication   Dysrhythmia    hx of PVC's (pt hasn't seen cardiologist since 2021- no follow up needed)   GERD (gastroesophageal reflux disease)    Headache    Heart murmur    as a child - no one mentions hearing murmur anymore, sometimes it is heard. Echo has been performed 02/03/18   Heartburn    no meds   History of pneumonia    x 3  years ago - no recent problems   Hx of blood clots 2019   bilateral LE   Hypertension    Left renal atrophy 12/19/2021   LEFT   Neuromuscular disorder (HCC)    small fiber neuropathy   Obesity    Pneumonia    Polycystic ovary    takes metformin  to treat   Skin lesion    Excisional biopsy of moles - none cancerous   Sleep apnea    yrs ago - diagnosed mild sleep apnea - did not have to use cpap    Past Surgical History:  Procedure Laterality Date    BREAST CAPSULECTOMY Bilateral 12/13/2020   1 week later pt had hematoma surgery on left breast   BREAST ENHANCEMENT SURGERY Bilateral 02/11/2017   BREATH TEK H PYLORI N/A 07/22/2013   Procedure: BREATH TEK H PYLORI;  Surgeon: Morene ONEIDA Olives, MD;  Location: [facility];  Service: General;  Laterality: N/A;   CESAREAN SECTION  2004, 2007   x 2   COLONOSCOPY  2013   COLONOSCOPY  2021   MS-F/V-sutab (good)-[facility]   CYSTOSCOPY WITH URETHRAL DILATATION  age 89    GASTRIC ROUX-EN-Y N/A 10/31/2013   Procedure: LAPAROSCOPIC ROUX-EN-Y GASTRIC BYPASS WITH UPPER ENDOSCOPY;  Surgeon: Morene ONEIDA Olives, MD;  Location: [facility];  Service: General;  Laterality: N/A;   GLUTEUS MINIMUS REPAIR Left 05/28/2021   Procedure: Left GLUTEUS Medius REPAIR;  Surgeon: Genelle Standing, MD;  Location: [facility] OR;  Service: Orthopedics;  Laterality: Left;   ingrown toenail Right    Ingrown nail on great right toe   kidney stent  08/28/2014  mole excision     benign juvenile melanoma removed from left leg - inner thigh   POLYPECTOMY     2013   SKIN LESION EXCISION     back   TONSILLECTOMY  age 84   TUBAL LIGATION  2018   Patient Active Problem List   Diagnosis Date Noted   Abnormal thyroid  blood test 12/22/2023   Mixed hyperlipidemia 12/22/2023   Essential hypertension 11/04/2022   Panic disorder (episodic paroxysmal anxiety) 07/10/2022   Major depressive disorder, recurrent, moderate (HCC) 07/10/2022   Left renal atrophy 12/19/2021   Stage 2 chronic kidney disease 11/20/2021   GERD (gastroesophageal reflux disease) 09/27/2021   Chronic pain syndrome 09/27/2021   Asthma 07/18/2020   Chronic myofascial pain 03/18/2018   Small fiber neuropathy 03/18/2018   Idiopathic small fiber sensory neuropathy 02/23/2018   Varicose veins of both lower extremities 01/13/2018   Cervical spondylosis without myelopathy 08/28/2017   Migraine with aura and without status migrainosus, not intractable 07/22/2017   Lumbar  degenerative disc disease 07/20/2017   Spondylosis of lumbar spine 07/20/2017   Coccydynia 03/12/2017   Status post bariatric surgery 05/02/2016   Tachy-brady syndrome (HCC) 12/19/2015   Iron  deficiency anemia 06/10/2015   Hypoglycemia, unspecified 04/16/2015   Morbid obesity (HCC) 06/24/2013   Sleep disorder 06/01/2013   Generalized anxiety disorder 05/30/2013   Depression, major, recurrent, severe with psychosis (HCC) 06/17/2012   Eating disorder 12/18/2011   Constipation 01/20/2011   Polycystic ovarian syndrome 09/01/2008    REFERRING DIAG: R10.2,G89.29 (ICD-10-CM) - Chronic pelvic pain in female  THERAPY DIAG:  Muscle weakness (generalized)  Other muscle spasm  Unspecified lack of coordination  Abnormal posture  Other low back pain  Pelvic pain  Pain in left hip  Rationale for Evaluation and Treatment Rehabilitation  PERTINENT HISTORY: Breast augmentation surgeries with follow-up reduction with complication, c/s X 2, gastric bypass, bil tubal ligation, long history of chronic low back/pelvic pain, pudendal neuralgia, coccydynia, Lt glute med repair, Lt ovarian cyst, pelvic tumor, small fiber neuropathy, Lt hip OA   PRECAUTIONS: extreme tachycardia, no standing exercise - monitor heart rate, pelvic tumor without biopsy, possible cancer? No dx at this point - just watching blood levels   SUBJECTIVE:                                                                                                                                                                                      SUBJECTIVE STATEMENT:   Pt states that she is having a lot of pain, but she thinks standing on one leg to put on pants is getting a little easier.   Problem list: -pudendal neuroalgia/intermittent labial  pain -Bil hip and low back pain -being unable to sit -deconditioning -urinary urgency/leaking -constipation  -dyspareunia    PAIN: 02/04/24 PAIN:  Are you having pain? Yes NPRS scale:  8/10 Pain location: vagina/labia, low back, hips, lower abdomen Pain orientation: Bilateral  PAIN TYPE: aching, burning, sharp, throbbing, tight, and tingling Pain description: constant  Aggravating factors: not exercising Relieving factors: alternating sides, lying on stomach, small amounts of standing     PRECAUTIONS: None   WEIGHT BEARING RESTRICTIONS: No   FALLS:  Has patient fallen in last 6 months? No   LIVING ENVIRONMENT: Lives with: lives with their family Lives in: House/apartment   OCCUPATION: disabled   PLOF: Independent   PATIENT GOALS: would like to be able to sit; decrease bil hip/low back pain    BOWEL MOVEMENT: Pain with bowel movement: No Type of bowel movement:Frequency 1x/day to every other day and Strain Yes Fully empty rectum: No Leakage: No Pads: No Fiber supplement: No no medication to help stimulate bowel movements   URINATION: Pain with urination: No Fully empty bladder: No Stream:  trouble starting the stream - worse at night Urgency: yes Frequency: every 30 minutes to an hour Leakage:  Laugh, cough, sneeze Pads: No due to irritation    INTERCOURSE: Pain with intercourse: Initial Penetration and During Penetration Ability to have vaginal penetration:  Yes: pain  Climax: yes Marinoff Scale: 2/3   PREGNANCY: Vaginal deliveries 0 Tearing No C-section deliveries 2 Currently pregnant No       OBJECTIVE:  01/25/24 PFIQ-7:  11/25/23: PFIQ-7: 90  FUNCTIONAL TESTS:  Single leg stance:  Rt: pelvic drop with UE support  Lt: pelvic drop with UE support 5x Sit<>stand: 37 seconds and HR went up to 151  LUMBARAROM/PROM:  A/PROM A/PROM  Eval (% available)  Flexion 35  Extension 40  Right lateral flexion 75  Left lateral flexion 75  Right rotation 60  Left rotation 60   (Blank rows = not tested)  PALPATION: signficiant tightness throughout posterior hips and bil lumbar paraspinals  HIP ROM: improving hip flexion with less  reproduction of low back pain  09/03/23:  PATIENT SURVEYS:   PFIQ-7: 100  COGNITION: Overall cognitive status: Within functional limits for tasks assessed     SENSATION: Light touch: Appears intact  FUNCTIONAL TESTS:  Single leg stance:  Rt: pelvic drop with UE support  Lt: pelvic drop with UE support  5xSTS: 36 seconds  GAIT: Assistive device utilized: None Comments: WNL  POSTURE: No Significant postural limitations   LUMBARAROM/PROM:  A/PROM A/PROM  Eval (% available)  Flexion 25  Extension 20  Right lateral flexion 50  Left lateral flexion 50  Right rotation 50  Left rotation 50   (Blank rows = not tested)  PALPATION:   General: lumbar paraspinal and oblique muscle restriction  Pelvic Alignment: Lt iliac crest elevation, significant Rt posterior rotation   Abdominal: lower abdominal tenderness                 External Perineal Exam: redness and irritation equal bil                             Internal Pelvic Floor: sharp, tender, pain throughout superficial muscles; achy and restricted in bil levator ani with more pain on the Rt compared to the Lt; dryness; paradoxical pelvic floor bearing down  Patient confirms identification and approves PT to assess internal pelvic floor and treatment Yes  PELVIC MMT:   MMT eval  Vaginal 1/5, 5 second hold, 2 repeat contractions - very difficult time with coordination  (Blank rows = not tested)        TONE: High with pain  PROLAPSE: None noted today; she did have paradoxical bearing down contraction so may not have been able to detect     TODAY'S TREATMENT  02/04/24 Manual: Soft tissue mobilization: Prone rhythmic rotation of bil LE 20x bil Manual work to the quadratus trigger  point bilaterally for a release Myofascial release: Fascial release of the sacrum to the low thoracic pulling the tissue apart gently  Spinal mobilization: Sacral and L1-5 PA mobilizations grade 1 Neuromuscular  re-education: Rhythmic rotation to bil LE Exercises: Passive stretching in prone Bil hip flexors Bil external rotators  Muscle pumping exercises to help decrease orthostatic blood pressure drops: Hand gripping Ankle circles Glute sets Knee flexion  01/25/24 Manual: Soft tissue mobilization: Prone rhythmic rotation of bil LE 20x bil Manual work to the quadratus trigger  point bilaterally for a release Myofascial release: Fascial release of the sacrum to the low thoracic pulling the tissue apart gently  Spinal mobilization: Sacral and L1-5 PA mobilizations grade 1 Exercises: Passive stretching in prone Bil hip flexors Bil external rotators    01/14/24 Manual: Soft tissue mobilization: Prone rhythmic rotation of bil LE 20x bil Manual work to the quadratus trigger  point bilaterally for a release Myofascial release: Fascial release of the sacrum to the low thoracic pulling the tissue apart gently  Spinal mobilization: Sacral and L1-5 PA mobilizations grade 1 Passive stretching Bil hip flexors Bil external rotators  Exercises: Doorway pec stretch 2 x 20 seconds bil Standing overhead press with no weight unilateral 5x bil Scapular squeezes 5x Rows + yellow band at door 5x Posterior shoulder rolls 10x   PATIENT EDUCATION:  09/28/23 Education details: Access Code: [email or web address] Person educated: Patient Education method: Explanation, Demonstration, Tactile cues, Verbal cues, and Handouts Education comprehension: verbalized understanding   HOME EXERCISE PROGRAM: 09/28/23 Access Code: [email or web address] URL: [email or web address] Date: 09/28/2023 Prepared by: Channing Pereyra  Exercises - Prone Diaphragmatic Breathing  - 2 x daily - 7 x weekly - 1 sets - 10 reps - Prone Hip External Rotation AROM  - 1 x daily - 7 x weekly - 1 sets - 10 reps   ASSESSMENT:   CLINICAL IMPRESSION: Pt is a 48 year old female with chief complaints of pelvic pain, pudendal neuralgia, inability  to sit, low back pain, bowel/bladder dysfunction, and dyspareunia. Pt doing better with functional ability over the last week, being able to stand on one leg to put on pants. She demonstrates less hypersensitivity across low back at sacral base. Good tolerance to rhythmic rotation of LE to help decrease tension holding and stretching. Gentle glute set performed to decrease low back pain with prone hip flexor stretch. She will continue to benefit from skilled PT intervention in order to decrease pain, improve functional ability specifically sitting tolerance, increase ease of starting stream/emptying bladder, decrease dyspareunia, and improve QOL.    OBJECTIVE IMPAIRMENTS: decreased activity tolerance, decreased coordination, decreased endurance, decreased mobility, decreased ROM, decreased strength, hypomobility, increased fascial restrictions, increased muscle spasms, impaired flexibility, impaired tone, postural dysfunction, and pain.    ACTIVITY LIMITATIONS: bending, sitting, standing, stairs, transfers, bed mobility, and locomotion level, vaginal penetration   PARTICIPATION LIMITATIONS: cleaning, laundry, interpersonal relationship, driving, and community activity   PERSONAL FACTORS: 3+ comorbidities: Breast augmentation surgeries with follow-up reduction with complication, c/s X  2, gastric bypass, bil tubal ligation, long history of chronic low back/pelvic pain, pudendal neuralgia, coccydynia, Lt glute med repair, Lt ovarian cyst, pelvic tumor, small fiber neuropathy, Lt hip OA  are also affecting patient's functional outcome.    REHAB POTENTIAL: Good   CLINICAL DECISION MAKING: Stable/uncomplicated   EVALUATION COMPLEXITY: Low     GOALS: Goals reviewed with patient? Yes   SHORT TERM GOALS: Updated 01/25/24   Pt will be independent with HEP.    Baseline: Goal status: MET 11/03/23   2.  Pt will be independent with diaphragmatic breathing and down training activities in order to improve  pelvic floor relaxation and abdominal wall mobility.   Baseline:  Goal status: IN PROGRESS 01/25/24   3.  Pt will report improve stream initiation and complete voiding with urination in order to decrease urinary leaking that causes skin irritation. Baseline:  Goal status: IN PROGRESS 01/25/24              4.  Pt will increase all impaired lumbar A/ROM by 25% without pain in order to decrease pelvic pain and improve pelvic floor muscle length tension relationship.   Baseline: good progress with exception of flexion which remains most limited Goal status: IN PROGRESS 01/25/24  5. Pt will stop straining with bowel movements to decrease strain on pelvic floor muscles in order to reduce pelvic pain, allowing her to sit for longer periods of time.   Baseline: she reports some improvements  Goal status: IN PROGRESS 01/25/24   LONG TERM GOALS: Updated 01/25/24   Pt will be independent with advanced HEP.    Baseline:  Goal status:IN PROGRESS 01/25/24   2.  Pt will be able to have intercourse with no greater than 5/10 pain in order to maintain intimate relationship with partner.  Baseline: has not had intercourse Goal status: IN PROGRESS 01/25/24   3.  Pt will be able to ascend/descend 12 steps at regular intervals throughout the day safely and with minimal difficulty in order to perform household chores.  Baseline: can go up and down steps 1x/day, but does have increased pain Goal status: IN PROGRESS 01/25/24   4.  Pt will improve 5xSTS to less than 20 seconds and improved PFIQ by 25% in order to demonstrate improved QOL and functional ability.  Baseline: 37 seconds today with bil UE use - not updated today due to feeling dizzy Goal status: IN PROGRESS 01/25/24   5.  Patient will report no greater than 5/10 pain with sitting in order to more easily attend appointments, drive, and spend time with family.  Baseline: pt in 10/10 pain today Goal status: IN PROGRESS 01/25/24   6.  Pt will be able  to sit for >30 minutes in order to allow heart rate to remain lower and perform seated exercise.    Baseline: no sitting tolerance; she can sit for short period of time, but less than 5 minutes  Goal status: IN PROGRESS 01/25/24  7. Pt will be able to stand on one leg in order to put on pants on without pain or difficulty.    Baseline: able to, but pelvic drop; continues to have pelvic drop Goal status: IN PROGRESS 01/25/24   PLAN:   PT FREQUENCY: 1x/week   PT DURATION: 12 weeks   PLANNED INTERVENTIONS: Therapeutic exercises, Therapeutic activity, Neuromuscular re-education, Balance training, Gait training, Patient/Family education, Self Care, Joint mobilization, Aquatic Therapy, Dry Needling, Biofeedback, and Manual therapy   PLAN FOR NEXT SESSION: Internal manual techniques  to help improve pelvic floor muscle spasm and pain. Work on pelvic alignment. Seems to be a candidate for cranio sacral work, she responds to fascial work, worked around the uterus to reduce restrictions; re-evaluation next session   [employer], PT, DPT10/09/258:47 AM

## 2024-02-05 ENCOUNTER — Other Ambulatory Visit (HOSPITAL_COMMUNITY): Payer: Self-pay

## 2024-02-05 ENCOUNTER — Encounter: Payer: Self-pay | Admitting: Oncology

## 2024-02-05 DIAGNOSIS — F331 Major depressive disorder, recurrent, moderate: Secondary | ICD-10-CM | POA: Diagnosis not present

## 2024-02-05 DIAGNOSIS — F411 Generalized anxiety disorder: Secondary | ICD-10-CM | POA: Diagnosis not present

## 2024-02-05 NOTE — Telephone Encounter (Signed)
 Is this medication a new start for the patient? I see I did an approved PA earlier this year but Qulipta  was not mentioned in the most recent chart note. I also could not find where it has been being filled via the ccm report. If this is a Futures trader will require a documentation as to if the medication is working for the patient as in having fewer migraines per month, lessened severity etc. Please advise.

## 2024-02-08 ENCOUNTER — Other Ambulatory Visit (HOSPITAL_COMMUNITY): Payer: Self-pay

## 2024-02-08 ENCOUNTER — Encounter: Payer: Self-pay | Admitting: Oncology

## 2024-02-08 NOTE — Telephone Encounter (Signed)
 Pharmacy Patient Advocate Encounter   Received notification from CoverMyMeds that prior authorization for Qulipta  is required/requested.   Insurance verification completed.   The patient is insured through [facility] ADVANTAGE/RX ADVANCE.   Per test claim: PA required; PA submitted to above mentioned insurance via Latent Key/confirmation #/EOC [id number] Status is pending

## 2024-02-09 NOTE — Telephone Encounter (Signed)
 Pharmacy Patient Advocate Encounter  Received notification from [employer] that Prior Authorization for Qulipta  has been APPROVED from 02/08/2023 to 08/06/2024   PA #/Case ID/Reference #: [id number]

## 2024-02-10 ENCOUNTER — Encounter

## 2024-02-12 DIAGNOSIS — F410 Panic disorder [episodic paroxysmal anxiety] without agoraphobia: Secondary | ICD-10-CM | POA: Diagnosis not present

## 2024-02-12 DIAGNOSIS — F331 Major depressive disorder, recurrent, moderate: Secondary | ICD-10-CM | POA: Diagnosis not present

## 2024-02-12 DIAGNOSIS — F411 Generalized anxiety disorder: Secondary | ICD-10-CM | POA: Diagnosis not present

## 2024-02-19 DIAGNOSIS — F411 Generalized anxiety disorder: Secondary | ICD-10-CM | POA: Diagnosis not present

## 2024-02-19 DIAGNOSIS — F331 Major depressive disorder, recurrent, moderate: Secondary | ICD-10-CM | POA: Diagnosis not present

## 2024-03-06 NOTE — Progress Notes (Unsigned)
  Electrophysiology Office Note:   Date:  03/06/2024  ID:  Katelyn [name], DOB 28-May-1975, MRN [id number]  Primary Cardiologist: Maude Emmer, MD Primary Heart Failure: None Electrophysiologist: None  {Click to update primary MD,subspecialty MD or APP then REFRESH:1}    History of Present Illness:   Katelyn [name] is a 48 y.o. female with h/o PVCs, hypertension, chronic and worsening small fiber neuropathy, dysautonomia seen today for routine electrophysiology followup.   Since last being seen in our clinic the patient reports doing ***.  she denies chest pain, palpitations, dyspnea, PND, orthopnea, nausea, vomiting, dizziness, syncope, edema, weight gain, or early satiety.   Review of systems complete and found to be negative unless listed in HPI.   EP Information / Studies Reviewed:    {EKGtoday:28818}       Risk Assessment/Calculations:            Physical Exam:   VS:  There were no vitals taken for this visit.   Wt Readings from Last 3 Encounters:  12/31/23 275 lb (124.7 kg)  12/25/23 275 lb 9.6 oz (125 kg)  12/22/23 277 lb 9.6 oz (125.9 kg)     GEN: Well nourished, well developed in no acute distress NECK: No JVD; No carotid bruits CARDIAC: {EPRHYTHM:28826}, no murmurs, rubs, gallops RESPIRATORY:  Clear to auscultation without rales, wheezing or rhonchi  ABDOMEN: Soft, non-tender, non-distended EXTREMITIES:  No edema; No deformity   ASSESSMENT AND PLAN:    1.  Autonomic dysfunction: Patient has small vessel neuropathy.  Advised salt loading up to 12 g/day and 3 L of fluid per day.  She has been given instructions on an exercise regimen.  Neurology prefers sodium of 2 g/day due to small fiber neuropathy.  Unable to tolerate compression.  Has worked with PT for recumbent exercise though unable to tolerate prolonged sitting.  On metoprolol  and as needed diltiazem .***  2.  Hypertension:***  Follow up with {EPMDS:28135::EP Team} {EPFOLLOW  LE:71826}  Signed, Findley Blankenbaker Gladis Norton, MD

## 2024-03-07 ENCOUNTER — Encounter: Payer: Self-pay | Admitting: Cardiology

## 2024-03-07 ENCOUNTER — Ambulatory Visit: Attending: Cardiology | Admitting: Cardiology

## 2024-03-07 VITALS — BP 142/90 | HR 125 | Ht 65.5 in | Wt 272.0 lb

## 2024-03-07 DIAGNOSIS — G909 Disorder of the autonomic nervous system, unspecified: Secondary | ICD-10-CM | POA: Diagnosis not present

## 2024-03-07 DIAGNOSIS — G901 Familial dysautonomia [Riley-Day]: Secondary | ICD-10-CM | POA: Diagnosis not present

## 2024-03-07 MED ORDER — PROPRANOLOL HCL 20 MG PO TABS: 20.0000 mg | ORAL_TABLET | Freq: Two times a day (BID) | ORAL | 3 refills | Status: AC

## 2024-03-07 NOTE — Patient Instructions (Addendum)
 Medication Instructions:  Your physician has recommended you make the following change in your medication:  1) STOP taking metoprolol   2) START taking propanolol 20 mg twice daily  *If you need a refill on your cardiac medications before your next appointment, please call your pharmacy*  Follow-Up: At [facility], you and your health needs are our priority.  As part of our continuing mission to provide you with exceptional heart care, our providers are all part of one team.  This team includes your primary Cardiologist (physician) and Advanced Practice Providers or APPs (Physician Assistants and Nurse Practitioners) who all work together to provide you with the care you need, when you need it.  Your next appointment:   3 months  Provider:   You will see one of the following Advanced Practice Providers on your designated Care Team:   Charlies Arthur, [place] Ozell Jodie Passey, PA-C Suzann Riddle, NP Daphne Barrack, NP Artist Pouch, PA-C

## 2024-03-16 DIAGNOSIS — F411 Generalized anxiety disorder: Secondary | ICD-10-CM | POA: Diagnosis not present

## 2024-03-16 DIAGNOSIS — F331 Major depressive disorder, recurrent, moderate: Secondary | ICD-10-CM | POA: Diagnosis not present

## 2024-03-21 ENCOUNTER — Other Ambulatory Visit: Payer: Self-pay | Admitting: Family Medicine

## 2024-03-21 DIAGNOSIS — R7989 Other specified abnormal findings of blood chemistry: Secondary | ICD-10-CM | POA: Diagnosis not present

## 2024-03-21 DIAGNOSIS — E782 Mixed hyperlipidemia: Secondary | ICD-10-CM | POA: Diagnosis not present

## 2024-03-21 DIAGNOSIS — Z9884 Bariatric surgery status: Secondary | ICD-10-CM | POA: Diagnosis not present

## 2024-03-22 LAB — TSH: TSH: 3.11 u[IU]/mL (ref 0.450–4.500)

## 2024-03-22 LAB — LIPID PANEL
Chol/HDL Ratio: 5.1 ratio — ABNORMAL HIGH (ref 0.0–4.4)
Cholesterol, Total: 159 mg/dL (ref 100–199)
HDL: 31 mg/dL — ABNORMAL LOW (ref >39)
LDL Chol Calc (NIH): 84 mg/dL (ref 0–99)
Triglycerides: 265 mg/dL — ABNORMAL HIGH (ref 0–149)
VLDL Cholesterol Cal: 44 mg/dL — ABNORMAL HIGH (ref 5–40)

## 2024-03-22 LAB — CORTISOL-AM, BLOOD: Cortisol - AM: 13.5 ug/dL (ref 6.2–19.4)

## 2024-03-22 LAB — T4, FREE: Free T4: 1.01 ng/dL (ref 0.82–1.77)

## 2024-03-29 ENCOUNTER — Encounter: Payer: Self-pay | Admitting: "Endocrinology

## 2024-03-29 ENCOUNTER — Ambulatory Visit: Admitting: "Endocrinology

## 2024-03-29 VITALS — BP 126/84 | HR 96 | Ht 65.5 in | Wt 275.2 lb

## 2024-03-29 DIAGNOSIS — E162 Hypoglycemia, unspecified: Secondary | ICD-10-CM

## 2024-03-29 DIAGNOSIS — R7989 Other specified abnormal findings of blood chemistry: Secondary | ICD-10-CM

## 2024-03-29 DIAGNOSIS — E782 Mixed hyperlipidemia: Secondary | ICD-10-CM | POA: Diagnosis not present
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
## 2024-03-29 NOTE — Patient Instructions (Signed)

## 2024-04-01 DIAGNOSIS — F331 Major depressive disorder, recurrent, moderate: Secondary | ICD-10-CM | POA: Diagnosis not present

## 2024-04-01 DIAGNOSIS — F411 Generalized anxiety disorder: Secondary | ICD-10-CM | POA: Diagnosis not present

## 2024-04-08 ENCOUNTER — Inpatient Hospital Stay: Attending: Physician Assistant

## 2024-04-08 ENCOUNTER — Inpatient Hospital Stay

## 2024-04-08 DIAGNOSIS — D72829 Elevated white blood cell count, unspecified: Secondary | ICD-10-CM | POA: Diagnosis present

## 2024-04-08 DIAGNOSIS — R7982 Elevated C-reactive protein (CRP): Secondary | ICD-10-CM

## 2024-04-08 DIAGNOSIS — D508 Other iron deficiency anemias: Secondary | ICD-10-CM | POA: Diagnosis present

## 2024-04-08 DIAGNOSIS — R7989 Other specified abnormal findings of blood chemistry: Secondary | ICD-10-CM

## 2024-04-08 DIAGNOSIS — D509 Iron deficiency anemia, unspecified: Secondary | ICD-10-CM

## 2024-04-08 DIAGNOSIS — R700 Elevated erythrocyte sedimentation rate: Secondary | ICD-10-CM

## 2024-04-08 DIAGNOSIS — D75839 Thrombocytosis, unspecified: Secondary | ICD-10-CM | POA: Insufficient documentation

## 2024-04-08 LAB — CBC WITH DIFFERENTIAL/PLATELET
Abs Immature Granulocytes: 0.08 K/uL — ABNORMAL HIGH (ref 0.00–0.07)
Basophils Absolute: 0.1 K/uL (ref 0.0–0.1)
Basophils Relative: 1 %
Eosinophils Absolute: 0.2 K/uL (ref 0.0–0.5)
Eosinophils Relative: 2 %
HCT: 45.1 % (ref 36.0–46.0)
Hemoglobin: 14.6 g/dL (ref 12.0–15.0)
Immature Granulocytes: 1 %
Lymphocytes Relative: 24 %
Lymphs Abs: 2.7 K/uL (ref 0.7–4.0)
MCH: 31.7 pg (ref 26.0–34.0)
MCHC: 32.4 g/dL (ref 30.0–36.0)
MCV: 98 fL (ref 80.0–100.0)
Monocytes Absolute: 0.6 K/uL (ref 0.1–1.0)
Monocytes Relative: 6 %
Neutro Abs: 7.5 K/uL (ref 1.7–7.7)
Neutrophils Relative %: 66 %
Platelets: 440 K/uL — ABNORMAL HIGH (ref 150–400)
RBC: 4.6 MIL/uL (ref 3.87–5.11)
RDW: 13 % (ref 11.5–15.5)
WBC: 11.2 K/uL — ABNORMAL HIGH (ref 4.0–10.5)
nRBC: 0 % (ref 0.0–0.2)

## 2024-04-08 LAB — SEDIMENTATION RATE: Sed Rate: 33 mm/h — ABNORMAL HIGH (ref 0–20)

## 2024-04-08 LAB — IRON AND TIBC
Iron: 51 ug/dL (ref 28–170)
Saturation Ratios: 20 % (ref 10.4–31.8)
TIBC: 259 ug/dL (ref 250–450)
UIBC: 209 ug/dL

## 2024-04-08 LAB — FERRITIN: Ferritin: 328 ng/mL — ABNORMAL HIGH (ref 11–307)

## 2024-04-08 LAB — C-REACTIVE PROTEIN: CRP: 3.2 mg/dL — ABNORMAL HIGH (ref <1.0)

## 2024-04-08 LAB — LACTATE DEHYDROGENASE: LDH: 189 U/L (ref 105–235)

## 2024-04-11 DIAGNOSIS — F411 Generalized anxiety disorder: Secondary | ICD-10-CM | POA: Diagnosis not present

## 2024-04-11 DIAGNOSIS — F331 Major depressive disorder, recurrent, moderate: Secondary | ICD-10-CM | POA: Diagnosis not present

## 2024-04-15 ENCOUNTER — Inpatient Hospital Stay: Admitting: Oncology

## 2024-04-15 DIAGNOSIS — D509 Iron deficiency anemia, unspecified: Secondary | ICD-10-CM | POA: Diagnosis not present

## 2024-04-15 DIAGNOSIS — D72829 Elevated white blood cell count, unspecified: Secondary | ICD-10-CM

## 2024-04-15 NOTE — Progress Notes (Signed)
 "  VIRTUAL VISIT via TELEPHONE NOTE [facility]   I connected with Katelyn [name]  on 04/15/2024 at  8:20 AM by telephone and verified that I am speaking with the correct person using two identifiers.  Location: Patient: Home Provider: [facility]   I discussed the limitations, risks, security and privacy concerns of performing an evaluation and management service by telephone and the availability of in person appointments. I also discussed with the patient that there may be a patient responsible charge related to this service. The patient expressed understanding and agreed to proceed.  REASON FOR VISIT: Iron  deficiency + macrocytosis + elevated B12 + leukocytosis/thrombocytosis  INTERVAL HISTORY:  [name] is contacted today for follow-up of her leukocytosis and thrombocytosis as well as her iron  deficiency state.    Saw endocrinology and cardiology since her last visit.  She was placed on propranolol  twice daily and diltiazem  for breakthrough palpitations/tachycardia.  Reports this helps some.  Reports some extensive hair loss since February 2025.  Reports concern for weight gain over the past 6 months.  States prior to this, she was losing weight unexpectedly and now feels like she is gaining 10 pounds monthly.  She is swelling in her right leg and has already had a Doppler to rule out blood clot.  Her PCP is aware and keeping an eye on this.  Reports her energy levels are still low.  She has chronic pain 7 out of 10 all over.  Nausea, vomiting and diarrhea with rotating constipation intermittently.  Has wheezing and a cough, low-grade fevers 99.3.  Reports headache similar to when she had meningitis.  Has numbness and tingling in her hands and feet.  Has bruising.  Reports trouble sleeping but takes medication for this that helps.  Patient has endocrinology appointment on 05/17/2024.  She is hoping to get some answers from this visit.     ASSESSMENT & PLAN:  1.  Intermittent leukocytosis & thrombocytosis - She has had intermittent leukocytosis since at least 2013, neutrophil predominant.  Persistent leukocytosis ever since meningitis in June 2023. - Clinically seems to have some underlying pro-inflammatory state, although previous autoimmune workup at [facility] was negative. - She denies any active steroids (reports Symbicort  is old prescription, and has not yet used desonide  cream) - Non-smoker. - No recent infections. - No masses or lymphadenopathy. - Reports intermittent low-grade fevers ever since she was diagnosed with meningitis in 2023. - Reports facial flushing and diaphoresis that occurs intermittently, usually when standing. - MPN workup negative, but inflammatory markers elevated. BCR-ABL FISH negative JAK2 with reflex to CALR, MPL, Exons 12-15 negative. Elevated ESR 40, elevated CRP 3.1 Negative ANA, normal rheumatoid factor. - Bone marrow biopsy from 01/27/2023 showed no significant CD34 positive blastic population identified.  T cells with nonspecific changes.  No monoclonal B-cell population identified.  Normocellular bone marrow for age with trilineage hematopoiesis.  There are mild dyspoietic changes involving the granular lytic cell lines with no increase in blastic cells.  The findings are not considered specific or diagnostic of a myeloid neoplasm and may be secondary in nature to infection, immune mediated process or medication. - Most recent labs (12/07/2023): Mild leukocytosis and thrombocytosis remain at baseline with WBC 12.5/ANC 9.1, platelets 467. Elevated ESR 49, elevated CRP 2.9 - Overall, differential diagnosis continues to favor reactive leukocytosis and thrombocytosis secondary to inflammatory state. -MPN workup negative to date.  Suspect reactive leukocytosis and thrombocytosis from underlying pro-inflammatory state. -  Discussed extensively with patient that she does not have any evidence of a primary  blood disorder, but that her hematologic abnormalities are likely reactive secondary to another problem. - Labs in 4 months = CBC/D, LDH, ESR, CRP - OFFICE visit in 4 months - Would consider repeat bone marrow biopsy if any significant changes from baseline. - We have entered referral for rheumatology workup of inflammatory state that appears to been present ever since she was diagnosed and treated for meningitis in 2023.  (Scheduled for new patient evaluation with Dr. Jeannetta in January 2026)  2.  Iron  deficiency state: - Malabsorption in the setting of prior gastric bypass surgery and chronic PPI use. - Patient denies any signs or symptoms of abnormal blood loss; no bright red blood per rectum or melena.  Menses are regular, last 3 days, but with heavy blood loss and clots.     - She reports that her surgery in August 2022 (removal of breast implants) resulted in large hematoma requiring surgical drainage - Injectafer  on 01/28/2019 and 05/04/2020 - patient had a reaction (vs. side effects) after her Injectafer  treatment (fever, headaches, general malaise), despite being premedicated with Benadryl , Tylenol , and Solu-Medrol .  She had previous reaction to Feraheme  and has multiple medication allergies. - Most recent IV iron  (Venofer  200 mg x 2) in January/February 2025 - Most recent labs from 04/08/2024 show iron  saturation 20% within normal TIBC.  Ferritin 328.  Hemoglobin 14.6 with elevated platelet count at 440.  White blood cell count slightly elevated at 11.2. - We discussed giving her 2 additional doses of IV Venofer  to see if this helped with her hair loss and leg swelling.  Iron  saturation should be closer to 25 to 30%.  Recheck labs in 4 months.  3.  Macrocytosis, RESOLVED: - She has intermittent mild macrocytosis - She had history of fatty infiltration of liver prior to her gastric bypass surgery (per abdominal US  on 07/13/2009).  No subsequent imaging after surgery in 2015. - Additional work-up  including SPEP and TSH was unremarkable.  LFTs normal.  Normal LDH, ESR, CRP, copper . - No vitamin B12 or folic acid  deficiency per labs from 09/10/2021 - Most recent abdominal imaging with CT abdomen/renal stone study (10/14/2020): Unremarkable unenhanced appearance of liver, no focal lesion identified; spleen normal in size without focal abnormality - Most recent MCV normal.  4.  Elevated B12 levels: - She had a persistently elevated B12 level since at least June of 2021 (highest B12 was around 4000 in September 2021) - Most recent labs (12/07/2023) shows nearly normal vitamin B12 at 1,094.  Normal MMA and folate. - She takes half of a bariatric vitamin at home daily. - Unclear etiology, I have explained to her that this is most likely secondary to inflammation - We will continue to watch and wait.  No immediate cause for concern from a hematology/oncology standpoint.  5.  OTHER PROBLEMS - She is following with cardiology and neurology due to autonomic dysfunction (POTS) and small fiber neuropathy, with complex chronic pain syndrome. - Overall, patient reports functional decline ever since being diagnosed with meningitis in June 2023. - Patient presents with myriad of other symptoms as described in HPI that are nonspecific and not apparently related to her hematologic complaints  PLAN SUMMARY: >> Labs in 4 months = CBC/D, ferritin, iron /TIBC, ESR, CRP, LDH >> PHONE visit in 4 months (1 week after labs)  **Note:  Patient does not feel comfortable stretching time between visits to longer than 4  months.  Alternating visits between APPs.     REVIEW OF SYSTEMS:   Review of Systems  Constitutional:  Positive for fever and malaise/fatigue.  Respiratory:  Positive for cough and shortness of breath.   Gastrointestinal:  Positive for abdominal pain, constipation, diarrhea, nausea and vomiting.  Musculoskeletal:  Positive for joint pain and myalgias.  Neurological:  Positive for dizziness,  tingling, sensory change and headaches.  Psychiatric/Behavioral:  Positive for depression. The patient is nervous/anxious and has insomnia.      PHYSICAL EXAM: (per limitations of virtual telephone visit)  The patient is alert and oriented x 3, exhibiting adequate mentation, good mood, and ability to speak in full sentences and execute sound judgement.  WRAP UP:   I discussed the assessment and treatment plan with the patient. The patient was provided an opportunity to ask questions and all were answered. The patient agreed with the plan and demonstrated an understanding of the instructions.   The patient was advised to call back or seek an in-person evaluation if the symptoms worsen or if the condition fails to improve as anticipated.  I provided 35 minutes of non-face-to-face time during this encounter, including > 10 minutes of medical discussion.  Delon Hope, AGNP-C Department of Hematology/Oncology [facility] at [facility]  Phone: [phone]  04/15/2024 9:21 AM  "

## 2024-04-25 ENCOUNTER — Encounter: Payer: Self-pay | Admitting: *Deleted

## 2024-05-04 ENCOUNTER — Other Ambulatory Visit: Payer: Self-pay | Admitting: Family Medicine

## 2024-05-04 ENCOUNTER — Encounter: Payer: Self-pay | Admitting: Family Medicine

## 2024-05-04 DIAGNOSIS — R197 Diarrhea, unspecified: Secondary | ICD-10-CM

## 2024-05-04 DIAGNOSIS — G608 Other hereditary and idiopathic neuropathies: Secondary | ICD-10-CM

## 2024-05-04 DIAGNOSIS — M2550 Pain in unspecified joint: Secondary | ICD-10-CM

## 2024-05-04 DIAGNOSIS — R112 Nausea with vomiting, unspecified: Secondary | ICD-10-CM

## 2024-05-04 MED ORDER — PROMETHAZINE HCL 25 MG PO TABS: 25.0000 mg | ORAL_TABLET | Freq: Three times a day (TID) | ORAL | 6 refills | Status: AC | PRN

## 2024-05-06 ENCOUNTER — Inpatient Hospital Stay: Attending: Physician Assistant

## 2024-05-06 VITALS — BP 143/90 | HR 100 | Temp 97.8°F | Resp 19

## 2024-05-06 DIAGNOSIS — D508 Other iron deficiency anemias: Secondary | ICD-10-CM

## 2024-05-06 DIAGNOSIS — D509 Iron deficiency anemia, unspecified: Secondary | ICD-10-CM

## 2024-05-06 MED ORDER — SODIUM CHLORIDE 0.9 % IV SOLN: INTRAVENOUS | Status: DC

## 2024-05-06 MED ORDER — SODIUM CHLORIDE 0.9 % IV SOLN
200.0000 mg | Freq: Once | INTRAVENOUS | Status: AC
  Administered 2024-05-06: 200 mg via INTRAVENOUS
  Filled 2024-05-06: qty 200

## 2024-05-06 NOTE — Progress Notes (Signed)
 Patient presents today for IV Venofer  HR 105 patient's baseline, Delon Hope NP made aware. Patient reports taking her pre-meds at home Tylenol , benadryl  and Pepcid . Patient tolerated iron  infusion with no complaints voiced. Peripheral IV site clean and dry with good blood return noted before and after infusion. Band aid applied. Patient unable to stay 30 minute wait time post iron  due to pain, patient does report dizziness after infusion which she states is normal for her after every infusion. VSS with discharge and left in satisfactory condition with no s/s of distress noted.

## 2024-05-06 NOTE — Patient Instructions (Signed)
 [facility] [place] - A DEPT OF [facility]  Discharge Instructions: Thank you for choosing [facility] to provide your oncology and hematology care.  If you have a lab appointment with the Cancer Center - please note that after April 8th, 2024, all labs will be drawn in the cancer center.  You do not have to check in or register with the main entrance as you have in the past but will complete your check-in in the cancer center.  Wear comfortable clothing and clothing appropriate for easy access to any Portacath or PICC line.   We strive to give you quality time with your provider. You may need to reschedule your appointment if you arrive late (15 or more minutes).  Arriving late affects you and other patients whose appointments are after yours.  Also, if you miss three or more appointments without notifying the office, you may be dismissed from the clinic at the provider's discretion.      For prescription refill requests, have your pharmacy contact our office and allow 72 hours for refills to be completed.    Today you received the following IV venofer, return as scheduled.   To help prevent nausea and vomiting after your treatment, we encourage you to take your nausea medication as directed.  BELOW ARE SYMPTOMS THAT SHOULD BE REPORTED IMMEDIATELY: *FEVER GREATER THAN 100.4 F (38 C) OR HIGHER *CHILLS OR SWEATING *NAUSEA AND VOMITING THAT IS NOT CONTROLLED WITH YOUR NAUSEA MEDICATION *UNUSUAL SHORTNESS OF BREATH *UNUSUAL BRUISING OR BLEEDING *URINARY PROBLEMS (pain or burning when urinating, or frequent urination) *BOWEL PROBLEMS (unusual diarrhea, constipation, pain near the anus) TENDERNESS IN MOUTH AND THROAT WITH OR WITHOUT PRESENCE OF ULCERS (sore throat, sores in mouth, or a toothache) UNUSUAL RASH, SWELLING OR PAIN  UNUSUAL VAGINAL DISCHARGE OR ITCHING   Items with * indicate a potential emergency and should be followed up as soon as possible  or go to the Emergency Department if any problems should occur.  Please show the CHEMOTHERAPY ALERT CARD or IMMUNOTHERAPY ALERT CARD at check-in to the Emergency Department and triage nurse.  Should you have questions after your visit or need to cancel or reschedule your appointment, please contact [facility] [place] - A DEPT OF [facility] [phone]  and follow the prompts.  Office hours are 8:00 a.m. to 4:30 p.m. Monday - Friday. Please note that voicemails left after 4:00 p.m. may not be returned until the following business day.  We are closed weekends and major holidays. You have access to a nurse at all times for urgent questions. Please call the main number to the clinic [phone] and follow the prompts.  For any non-urgent questions, you may also contact your provider using MyChart. We now offer e-Visits for anyone 14 and older to request care online for non-urgent symptoms. For details visit [email or web address].   Also download the MyChart app! Go to the app store, search "MyChart", open the app, select [place], and log in with your MyChart username and password.

## 2024-05-07 LAB — COMPREHENSIVE METABOLIC PANEL WITH GFR
ALT: 25 IU/L (ref 0–32)
AST: 19 IU/L (ref 0–40)
Albumin: 4.4 g/dL (ref 3.9–4.9)
Alkaline Phosphatase: 130 IU/L — ABNORMAL HIGH (ref 41–116)
BUN/Creatinine Ratio: 15 (ref 9–23)
BUN: 13 mg/dL (ref 6–24)
Bilirubin Total: <0.2 mg/dL (ref 0.0–1.2)
CO2: 25 mmol/L (ref 20–29)
Calcium: 9.4 mg/dL (ref 8.7–10.2)
Chloride: 102 mmol/L (ref 96–106)
Creatinine, Ser: 0.85 mg/dL (ref 0.57–1.00)
Globulin, Total: 2.4 g/dL (ref 1.5–4.5)
Glucose: 90 mg/dL (ref 70–99)
Potassium: 4.4 mmol/L (ref 3.5–5.2)
Sodium: 141 mmol/L (ref 134–144)
Total Protein: 6.8 g/dL (ref 6.0–8.5)
eGFR: 84 mL/min/1.73

## 2024-05-07 LAB — CBC WITH DIFFERENTIAL/PLATELET
Basophils Absolute: 0.1 x10E3/uL (ref 0.0–0.2)
Basos: 1 %
EOS (ABSOLUTE): 0.2 x10E3/uL (ref 0.0–0.4)
Eos: 1 %
Hematocrit: 44.6 % (ref 34.0–46.6)
Hemoglobin: 14.9 g/dL (ref 11.1–15.9)
Immature Grans (Abs): 0.1 x10E3/uL (ref 0.0–0.1)
Immature Granulocytes: 1 %
Lymphocytes Absolute: 2.9 x10E3/uL (ref 0.7–3.1)
Lymphs: 21 %
MCH: 31.8 pg (ref 26.6–33.0)
MCHC: 33.4 g/dL (ref 31.5–35.7)
MCV: 95 fL (ref 79–97)
Monocytes Absolute: 0.7 x10E3/uL (ref 0.1–0.9)
Monocytes: 5 %
Neutrophils Absolute: 9.8 x10E3/uL — ABNORMAL HIGH (ref 1.4–7.0)
Neutrophils: 71 %
Platelets: 501 x10E3/uL — ABNORMAL HIGH (ref 150–450)
RBC: 4.68 x10E6/uL (ref 3.77–5.28)
RDW: 13 % (ref 11.7–15.4)
WBC: 13.7 x10E3/uL — ABNORMAL HIGH (ref 3.4–10.8)

## 2024-05-10 ENCOUNTER — Other Ambulatory Visit: Payer: Self-pay

## 2024-05-10 ENCOUNTER — Ambulatory Visit: Admitting: Family Medicine

## 2024-05-10 VITALS — BP 148/84 | HR 123 | Temp 97.7°F | Ht 65.5 in

## 2024-05-10 DIAGNOSIS — R197 Diarrhea, unspecified: Secondary | ICD-10-CM | POA: Diagnosis not present

## 2024-05-10 DIAGNOSIS — I495 Sick sinus syndrome: Secondary | ICD-10-CM

## 2024-05-10 DIAGNOSIS — R0902 Hypoxemia: Secondary | ICD-10-CM

## 2024-05-10 DIAGNOSIS — R0683 Snoring: Secondary | ICD-10-CM | POA: Diagnosis not present

## 2024-05-10 DIAGNOSIS — R0609 Other forms of dyspnea: Secondary | ICD-10-CM

## 2024-05-10 DIAGNOSIS — I10 Essential (primary) hypertension: Secondary | ICD-10-CM | POA: Diagnosis not present

## 2024-05-10 MED ORDER — METOPROLOL TARTRATE 25 MG PO TABS: ORAL_TABLET | ORAL | 1 refills | Status: AC

## 2024-05-10 MED ORDER — LOSARTAN POTASSIUM 100 MG PO TABS: 100.0000 mg | ORAL_TABLET | Freq: Every day | ORAL | 2 refills | Status: AC

## 2024-05-10 NOTE — Progress Notes (Signed)
 "  Subjective:    Patient ID: Katelyn [name], female    DOB: [birth year], 49 y.o.   MRN: [id number]  HPI Patient is in room 10 Patient states she has been feeling awful lately.  She relates it started off with progression of her chronic underlying issues She then started noticing frequent headaches body aches low-grade fevers some nausea.  In addition to this she also noticed having headaches when she has a bowel movement especially when she is having to bear down Her GI symptoms are primarily loose stools and intermittent for 5 times a day stool studies are pending May need to see gastroenterology Patient relates heart rate jumps up at times and blood pressure jumps up at times she is unable to do much in the way of physical activity because of her significant troubles with her inability to tolerate activity She tends to be fairly sedentary She does snore at night She finds herself tired during the day She states it would be very difficult for her to go to [facility] for sleep lab She had a sleep study over 10 years ago which had mild sleep apnea but not bad enough to use a CPAP She relates low energy Relates a lot of back and hip pain as she has seen her specialist for They have her on pain medication currently She will be seeing her gynecologist coming up for evaluation of pelvic symptoms She has an appointment coming up with [facility] rheumatology Dr. Jeannetta for evaluation of chronic inflammation of sed rate and CRP along with her joint and body discomfort Patient is here for a follow up and go over labs  Patient went to the cancer center and numbers were high like HR, BP, and O2  Results for orders placed or performed in visit on 05/04/24  ANA,IFA RA Diag Pnl w/rflx Tit/Patn   Collection Time: 05/06/24  2:36 PM  Result Value Ref Range   ANA Titer 1 Negative    Rheumatoid fact SerPl-aCnc <10.0 <14.0 IU/mL   Cyclic Citrullin Peptide Ab 10 0 - 19 units  Anti-CCP Ab, IgG + IgA (RDL)    Collection Time: 05/06/24  2:36 PM  Result Value Ref Range   Anti-CCP Ab, IgG + IgA (RDL) WILL FOLLOW   Sedimentation rate   Collection Time: 05/06/24  2:36 PM  Result Value Ref Range   Sed Rate 45 (H) 0 - 32 mm/hr  C-reactive protein   Collection Time: 05/06/24  2:36 PM  Result Value Ref Range   CRP 33 (H) 0 - 10 mg/L  Alpha-Gal Panel   Collection Time: 05/06/24  2:36 PM  Result Value Ref Range   Class Description Allergens Comment    IgE (Immunoglobulin E), Serum 16 6 - 495 IU/mL   Pork IgE <0.10 Class 0 kU/L   Beef IgE <0.10 Class 0 kU/L   Allergen Lamb IgE <0.10 Class 0 kU/L   O215-IgE Alpha-Gal <0.10 Class 0 kU/L  Tissue Transglutaminase Abs,IgG,IgA   Collection Time: 05/06/24  2:36 PM  Result Value Ref Range   t-Transglutaminase (tTG) IgA <2 0 - 3 U/mL   t-Transglutaminase (tTG) IgG <2 0 - 5 U/mL  CBC with Differential   Collection Time: 05/06/24  2:41 PM  Result Value Ref Range   WBC 13.7 (H) 3.4 - 10.8 x10E3/uL   RBC 4.68 3.77 - 5.28 x10E6/uL   Hemoglobin 14.9 11.1 - 15.9 g/dL   Hematocrit 55.3 65.9 - 46.6 %   MCV 95 79 - 97 fL  MCH 31.8 26.6 - 33.0 pg   MCHC 33.4 31.5 - 35.7 g/dL   RDW 86.9 88.2 - 84.5 %   Platelets 501 (H) 150 - 450 x10E3/uL   Neutrophils 71 Not Estab. %   Lymphs 21 Not Estab. %   Monocytes 5 Not Estab. %   Eos 1 Not Estab. %   Basos 1 Not Estab. %   Neutrophils Absolute 9.8 (H) 1.4 - 7.0 x10E3/uL   Lymphocytes Absolute 2.9 0.7 - 3.1 x10E3/uL   Monocytes Absolute 0.7 0.1 - 0.9 x10E3/uL   EOS (ABSOLUTE) 0.2 0.0 - 0.4 x10E3/uL   Basophils Absolute 0.1 0.0 - 0.2 x10E3/uL   Immature Granulocytes 1 Not Estab. %   Immature Grans (Abs) 0.1 0.0 - 0.1 x10E3/uL  Comprehensive metabolic panel   Collection Time: 05/06/24  2:41 PM  Result Value Ref Range   Glucose 90 70 - 99 mg/dL   BUN 13 6 - 24 mg/dL   Creatinine, Ser 9.14 0.57 - 1.00 mg/dL   eGFR 84 >40 fO/fpw/8.26   BUN/Creatinine Ratio 15 9 - 23   Sodium 141 134 - 144 mmol/L   Potassium  4.4 3.5 - 5.2 mmol/L   Chloride 102 96 - 106 mmol/L   CO2 25 20 - 29 mmol/L   Calcium  9.4 8.7 - 10.2 mg/dL   Total Protein 6.8 6.0 - 8.5 g/dL   Albumin 4.4 3.9 - 4.9 g/dL   Globulin, Total 2.4 1.5 - 4.5 g/dL   Bilirubin Total <9.7 0.0 - 1.2 mg/dL   Alkaline Phosphatase 130 (H) 41 - 116 IU/L   AST 19 0 - 40 IU/L   ALT 25 0 - 32 IU/L   *Note: Due to a large number of results and/or encounters for the requested time period, some results have not been displayed. A complete set of results can be found in Results Review.  Patient in the past has seen hematology for evaluation of her elevated white blood count she is also seeing infectious disease they did not feel there is any infection although more inflammation Labs were reviewed in detail Review of Systems     Objective:   Physical Exam Lungs clear respiratory rate normal heart regular Abdomen soft subjective discomfort in the lower abdomen no guarding or rebound blood pressure recheck improved Patient is alert does not seem drowsy O2 saturation at rest 93 with activity 87      Assessment & Plan:  Chronic pain-managed by her pain medicine specialist Mental health-managed by her mental health specialist 1. Diarrhea, unspecified type (Primary) Additional stool testing await the stool culture as well as this may need gastroenterology consult - C difficile Toxins A+B W/Rflx - Calprotectin, Fecal  2. Tachy-brady syndrome [facility]) Patient felt she was doing better on metoprolol  we will switch her back to metoprolol  she will follow-up with cardiology  3. Essential hypertension Bump up losartan  recheck metabolic 7 in approximately 7 to 14 days  4. Hypoxia Hypoxia with exertion chest x-ray first then pulmonary function test - DG Chest 2 View  Patient was snoring-a lot of fatigue tiredness Would benefit from a sleep study Will see if this can be arranged through health team [facility] as a home sleep study Chronic inflammation of sed  rate and CRP will be seeing rheumatology coming up very interested in seeing what their thoughts are  Recheck patient in 4 weeks time "

## 2024-05-11 ENCOUNTER — Ambulatory Visit: Payer: Self-pay

## 2024-05-11 ENCOUNTER — Telehealth: Payer: Self-pay | Admitting: *Deleted

## 2024-05-11 ENCOUNTER — Encounter: Payer: Self-pay | Admitting: *Deleted

## 2024-05-11 NOTE — Telephone Encounter (Signed)
 Called to follow up per Dr. Jeralyn since  patient cancelled her nurse visit, I left a message I was calling to follow up and will send a MyChart message for her to review and to respond to about if she is still having issues with labial irritation and wanted to do a swab [place] PEAK

## 2024-05-12 ENCOUNTER — Ambulatory Visit (HOSPITAL_COMMUNITY)
Admission: RE | Admit: 2024-05-12 | Discharge: 2024-05-12 | Disposition: A | Discharge status: Home / Self Care | Source: Ambulatory Visit | Attending: Family Medicine | Admitting: Family Medicine

## 2024-05-12 ENCOUNTER — Ambulatory Visit: Payer: Self-pay | Admitting: Family Medicine

## 2024-05-12 ENCOUNTER — Encounter: Payer: Self-pay | Admitting: Family Medicine

## 2024-05-12 DIAGNOSIS — R0902 Hypoxemia: Secondary | ICD-10-CM | POA: Insufficient documentation

## 2024-05-12 LAB — ALPHA-GAL PANEL
Allergen Lamb IgE: <0.1 kU/L
Beef IgE: <0.1 kU/L
IgE (Immunoglobulin E), Serum: 16 [IU]/mL (ref 6–495)
O215-IgE Alpha-Gal: <0.1 kU/L
Pork IgE: <0.1 kU/L

## 2024-05-12 LAB — SEDIMENTATION RATE: Sed Rate: 45 mm/h — ABNORMAL HIGH (ref 0–32)

## 2024-05-12 LAB — TISSUE TRANSGLUTAMINASE ABS,IGG,IGA
t-Transglutaminase (tTG) IgA: <2 U/mL (ref 0–3)
t-Transglutaminase (tTG) IgG: <2 U/mL (ref 0–5)

## 2024-05-12 LAB — ANA,IFA RA DIAG PNL W/RFLX TIT/PATN
Cyclic Citrullin Peptide Ab: 10 U (ref 0–19)
Rheumatoid fact SerPl-aCnc: <10 [IU]/mL

## 2024-05-12 LAB — C-REACTIVE PROTEIN: CRP: 33 mg/L — ABNORMAL HIGH (ref 0–10)

## 2024-05-12 LAB — ANTI-CCP AB, IGG + IGA (RDL): Anti-CCP Ab, IgG + IgA (RDL): <20 U

## 2024-05-13 ENCOUNTER — Inpatient Hospital Stay

## 2024-05-13 ENCOUNTER — Other Ambulatory Visit: Payer: Self-pay

## 2024-05-13 DIAGNOSIS — R0902 Hypoxemia: Secondary | ICD-10-CM

## 2024-05-13 DIAGNOSIS — R0609 Other forms of dyspnea: Secondary | ICD-10-CM

## 2024-05-13 LAB — STOOL CULTURE: E coli, Shiga toxin Assay: NEGATIVE

## 2024-05-13 NOTE — Telephone Encounter (Signed)
 Nurses-please see Ciria's message and make sure referral team is aware of this preference Also send Vonnie a message back letting her know that this has been taken into account thank you

## 2024-05-13 NOTE — Telephone Encounter (Signed)
 Message sent to referral coordinator with patient's preference for pulmonologist.

## 2024-05-16 LAB — C DIFFICILE, CYTOTOXIN B

## 2024-05-16 LAB — C DIFFICILE TOXINS A+B W/RFLX: C difficile Toxins A+B, EIA: NEGATIVE

## 2024-05-17 ENCOUNTER — Encounter: Payer: Self-pay | Admitting: Internal Medicine

## 2024-05-17 ENCOUNTER — Ambulatory Visit: Attending: Internal Medicine | Admitting: Internal Medicine

## 2024-05-17 VITALS — BP 135/86 | HR 132 | Temp 97.3°F | Resp 16 | Ht 65.5 in | Wt 276.4 lb

## 2024-05-17 DIAGNOSIS — G608 Other hereditary and idiopathic neuropathies: Secondary | ICD-10-CM

## 2024-05-17 DIAGNOSIS — I7381 Erythromelalgia: Secondary | ICD-10-CM

## 2024-05-17 DIAGNOSIS — R700 Elevated erythrocyte sedimentation rate: Secondary | ICD-10-CM | POA: Diagnosis not present

## 2024-05-17 DIAGNOSIS — M533 Sacrococcygeal disorders, not elsewhere classified: Secondary | ICD-10-CM

## 2024-05-17 DIAGNOSIS — M7918 Myalgia, other site: Secondary | ICD-10-CM | POA: Diagnosis not present

## 2024-05-17 DIAGNOSIS — G8929 Other chronic pain: Secondary | ICD-10-CM

## 2024-05-17 DIAGNOSIS — R899 Unspecified abnormal finding in specimens from other organs, systems and tissues: Secondary | ICD-10-CM | POA: Diagnosis not present

## 2024-05-17 DIAGNOSIS — G629 Polyneuropathy, unspecified: Secondary | ICD-10-CM

## 2024-05-17 LAB — CALPROTECTIN, FECAL: Calprotectin, Fecal: 219 ug/g — ABNORMAL HIGH (ref 0–120)

## 2024-05-17 NOTE — Assessment & Plan Note (Addendum)
 Pain associated with small fiber neuropathy and autonomic dysfunction. Management challenging due to NSAID and steroid contraindications. - Continue gabapentin  for pain management. - Provided reference information on chronic pain management strategies.   Orders:   CK

## 2024-05-17 NOTE — Patient Instructions (Addendum)
 I recommend checking out the [employer]  patient-centered guide for fibromyalgia and chronic pain management: [email or web address]  [email or web address]

## 2024-05-17 NOTE — Assessment & Plan Note (Addendum)
 Persistent inflammation with elevated white count, platelets, ESR, and sed rate. Symptoms include morning stiffness, muscle pain, and intermittent low oxygen levels. Differential includes inflammatory arthritis and autoimmune conditions. Management limited by contraindications to common anti-inflammatories. - Ordered blood tests including recheck of sed rate and C-reactive protein. - Ordered tests for ANA, rheumatoid factor, and CCP markers. - Will evaluate lab results for underlying causes of inflammation.  Orders:   C3 and C4   CK   IgG, IgA, IgM   Beta-2  glycoprotein antibodies   Cardiolipin antibodies, IgG, IgM, IgA   Sjogren's syndrome antibods(ssa + ssb)

## 2024-05-17 NOTE — Assessment & Plan Note (Addendum)
 Chronic dysautonomia and pain since 2018 post-bariatric surgery. Unresponsive to immunosuppressive or anti-inflammatory treatments. Discussed challenging diagnosis often limited response to DMARD more symptomatic management unless underlying process identifiable. - Continue beta blockers and gabapentin . - Provided reference information on exercises for dysautonomia and chronic pain management. - Checking sjogrens Abs complements and immunoglobulin titers for evidence of active immune disease Orders:   C3 and C4   IgG, IgA, IgM   Sjogren's syndrome antibods(ssa + ssb)

## 2024-05-18 ENCOUNTER — Other Ambulatory Visit: Payer: Self-pay

## 2024-05-18 DIAGNOSIS — R195 Other fecal abnormalities: Secondary | ICD-10-CM

## 2024-05-18 LAB — IGG, IGA, IGM
IgG (Immunoglobin G), Serum: 925 mg/dL (ref 600–1640)
IgM, Serum: 102 mg/dL (ref 50–300)
Immunoglobulin A: 138 mg/dL (ref 47–310)

## 2024-05-18 LAB — BETA-2 GLYCOPROTEIN ANTIBODIES
Beta-2 Glyco 1 IgA: <2 U/mL
Beta-2 Glyco 1 IgM: <2 U/mL
Beta-2 Glyco I IgG: <2 U/mL

## 2024-05-18 LAB — CK: Total CK: 80 U/L (ref 20–239)

## 2024-05-18 LAB — CARDIOLIPIN ANTIBODIES, IGG, IGM, IGA
Anticardiolipin IgA: <2 [APL'U]/mL
Anticardiolipin IgG: <2 [GPL'U]/mL
Anticardiolipin IgM: <2 [MPL'U]/mL

## 2024-05-18 LAB — C3 AND C4
C3 Complement: 177 mg/dL (ref 83–193)
C4 Complement: 30 mg/dL (ref 15–57)

## 2024-05-18 LAB — SJOGREN'S SYNDROME ANTIBODS(SSA + SSB)
SSA (Ro) (ENA) Antibody, IgG: <1 AI
SSB (La) (ENA) Antibody, IgG: <1 AI
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
## 2024-05-20 ENCOUNTER — Ambulatory Visit: Admitting: Gastroenterology

## 2024-05-20 ENCOUNTER — Other Ambulatory Visit

## 2024-05-20 ENCOUNTER — Inpatient Hospital Stay

## 2024-05-20 ENCOUNTER — Encounter: Payer: Self-pay | Admitting: Gastroenterology

## 2024-05-20 VITALS — BP 112/72 | HR 109 | Ht 65.5 in | Wt 277.0 lb

## 2024-05-20 VITALS — BP 114/70 | HR 85 | Temp 97.3°F

## 2024-05-20 DIAGNOSIS — R945 Abnormal results of liver function studies: Secondary | ICD-10-CM

## 2024-05-20 DIAGNOSIS — K760 Fatty (change of) liver, not elsewhere classified: Secondary | ICD-10-CM | POA: Diagnosis not present

## 2024-05-20 DIAGNOSIS — R7989 Other specified abnormal findings of blood chemistry: Secondary | ICD-10-CM

## 2024-05-20 DIAGNOSIS — G629 Polyneuropathy, unspecified: Secondary | ICD-10-CM | POA: Diagnosis not present

## 2024-05-20 DIAGNOSIS — R197 Diarrhea, unspecified: Secondary | ICD-10-CM

## 2024-05-20 DIAGNOSIS — Z860101 Personal history of adenomatous and serrated colon polyps: Secondary | ICD-10-CM | POA: Diagnosis not present

## 2024-05-20 DIAGNOSIS — Z79891 Long term (current) use of opiate analgesic: Secondary | ICD-10-CM

## 2024-05-20 DIAGNOSIS — D508 Other iron deficiency anemias: Secondary | ICD-10-CM

## 2024-05-20 DIAGNOSIS — R160 Hepatomegaly, not elsewhere classified: Secondary | ICD-10-CM

## 2024-05-20 DIAGNOSIS — R1084 Generalized abdominal pain: Secondary | ICD-10-CM

## 2024-05-20 DIAGNOSIS — R195 Other fecal abnormalities: Secondary | ICD-10-CM | POA: Diagnosis not present

## 2024-05-20 DIAGNOSIS — K219 Gastro-esophageal reflux disease without esophagitis: Secondary | ICD-10-CM | POA: Diagnosis not present

## 2024-05-20 DIAGNOSIS — G608 Other hereditary and idiopathic neuropathies: Secondary | ICD-10-CM

## 2024-05-20 DIAGNOSIS — Z9884 Bariatric surgery status: Secondary | ICD-10-CM

## 2024-05-20 DIAGNOSIS — G901 Familial dysautonomia [Riley-Day]: Secondary | ICD-10-CM

## 2024-05-20 LAB — COMPREHENSIVE METABOLIC PANEL WITH GFR
ALT: 20 U/L (ref 3–35)
AST: 17 U/L (ref 5–37)
Albumin: 4.4 g/dL (ref 3.5–5.2)
Alkaline Phosphatase: 116 U/L (ref 39–117)
BUN: 14 mg/dL (ref 6–23)
CO2: 30 meq/L (ref 19–32)
Calcium: 9.5 mg/dL (ref 8.4–10.5)
Chloride: 102 meq/L (ref 96–112)
Creatinine, Ser: 0.78 mg/dL (ref 0.40–1.20)
GFR: 89.66 mL/min
Glucose, Bld: 91 mg/dL (ref 70–99)
Potassium: 4.5 meq/L (ref 3.5–5.1)
Sodium: 140 meq/L (ref 135–145)
Total Bilirubin: 0.2 mg/dL (ref 0.2–1.2)
Total Protein: 7 g/dL (ref 6.0–8.3)

## 2024-05-20 LAB — CBC WITH DIFFERENTIAL/PLATELET
Basophils Absolute: 0 K/uL (ref 0.0–0.1)
Basophils Relative: 0.3 % (ref 0.0–3.0)
Eosinophils Absolute: 0.2 K/uL (ref 0.0–0.7)
Eosinophils Relative: 1.9 % (ref 0.0–5.0)
HCT: 43.7 % (ref 36.0–46.0)
Hemoglobin: 14.6 g/dL (ref 12.0–15.0)
Lymphocytes Relative: 18.5 % (ref 12.0–46.0)
Lymphs Abs: 2.3 K/uL (ref 0.7–4.0)
MCHC: 33.4 g/dL (ref 30.0–36.0)
MCV: 95.8 fl (ref 78.0–100.0)
Monocytes Absolute: 0.7 K/uL (ref 0.1–1.0)
Monocytes Relative: 5.5 % (ref 3.0–12.0)
Neutro Abs: 9.2 K/uL — ABNORMAL HIGH (ref 1.4–7.7)
Neutrophils Relative %: 73.8 % (ref 43.0–77.0)
Platelets: 401 K/uL — ABNORMAL HIGH (ref 150.0–400.0)
RBC: 4.56 Mil/uL (ref 3.87–5.11)
RDW: 14 % (ref 11.5–15.5)
WBC: 12.5 K/uL — ABNORMAL HIGH (ref 4.0–10.5)

## 2024-05-20 LAB — SEDIMENTATION RATE: Sed Rate: 50 mm/h — ABNORMAL HIGH (ref 0–20)

## 2024-05-20 LAB — C-REACTIVE PROTEIN: CRP: 3.5 mg/dL (ref 1.0–20.0)

## 2024-05-20 LAB — GAMMA GT: GGT: 24 U/L (ref 7–51)

## 2024-05-20 MED ORDER — SODIUM CHLORIDE 0.9 % IV SOLN
200.0000 mg | Freq: Once | INTRAVENOUS | Status: AC
  Administered 2024-05-20: 200 mg via INTRAVENOUS
  Filled 2024-05-20: qty 200

## 2024-05-20 MED ORDER — SUTAB 1479-225-188 MG PO TABS: 24.0000 | ORAL_TABLET | ORAL | 0 refills | Status: AC

## 2024-05-20 MED ORDER — SODIUM CHLORIDE 0.9 % IV SOLN: INTRAVENOUS | Status: DC

## 2024-05-20 NOTE — Progress Notes (Signed)
 Per pt she took her premedications- tylenol , benadryl , and pepcid  at home prior to appt.   Patient tolerated iron  infusion with no complaints voiced.  Peripheral IV site clean and dry with good blood return noted before and after infusion.  Pt declined to wait 30 minutes post iron  infusion. VSS with discharge and left in ambulatory and satisfactory condition with no s/s of distress noted.   Katelyn [name]

## 2024-05-20 NOTE — Patient Instructions (Signed)

## 2024-05-20 NOTE — Patient Instructions (Addendum)
 We have sent the following medications to your pharmacy for you to pick up at your convenience: Sutab    Your provider has requested that you go to the basement level for lab work before leaving today. Press B on the elevator. The lab is located at the first door on the left as you exit the elevator.  You will be contacted by [facility] in the next 2 days to arrange a RUQ ultrasound.  The number on your caller ID will be [phone], please answer when they call.  If you have not heard from them in 2 days please call [phone] to schedule.     You have been scheduled for a colonoscopy. Please follow written instructions given to you at your visit today.   If you use inhalers (even only as needed), please bring them with you on the day of your procedure.  DO NOT TAKE 7 DAYS PRIOR TO TEST- Trulicity (dulaglutide) Ozempic, Wegovy (semaglutide) Mounjaro, Zepbound (tirzepatide) Bydureon Bcise (exanatide extended release)  DO NOT TAKE 1 DAY PRIOR TO YOUR TEST Rybelsus (semaglutide) Adlyxin (lixisenatide) Victoza (liraglutide) Byetta (exanatide) ___________________________________________________________________________   Due to recent changes in healthcare laws, you may see the results of your imaging and laboratory studies on MyChart before your provider has had a chance to review them.  We understand that in some cases there may be results that are confusing or concerning to you. Not all laboratory results come back in the same time frame and the provider may be waiting for multiple results in order to interpret others.  Please give us  48 hours in order for your provider to thoroughly review all the results before contacting the office for clarification of your results.   Thank you for choosing me and  Gastroenterology.

## 2024-05-21 LAB — HEPATITIS B SURFACE ANTIGEN: Hepatitis B Surface Ag: NONREACTIVE

## 2024-05-21 LAB — HEPATITIS B SURFACE ANTIBODY,QUALITATIVE
Hep B S Ab: NONREACTIVE
Hep B S Ab: NONREACTIVE

## 2024-05-23 NOTE — Progress Notes (Signed)
 Agree with the assessment and plan as outlined by Nestor Blower, PA-C.  Complicated patient with numerous somatic complaints.  Persistent diarrhea with elevated calprotectin warrants repeat colonoscopy.  If unremarkable, would recommend stopping losartan  for a period (if PCP agrees), as losartan  has been associated with causing chronic diarrhea.  Ok for [facility] if patient is ambulatory/able to transfer independently.

## 2024-05-25 ENCOUNTER — Encounter: Payer: Self-pay | Admitting: Family Medicine

## 2024-05-25 ENCOUNTER — Ambulatory Visit: Payer: Self-pay | Admitting: Gastroenterology

## 2024-05-25 LAB — QUANTIFERON-TB GOLD PLUS
Mitogen-NIL: >10 [IU]/mL
NIL: 0.04 [IU]/mL
QuantiFERON-TB Gold Plus: NEGATIVE
TB1-NIL: <0 [IU]/mL
TB2-NIL: <0 [IU]/mL

## 2024-05-25 NOTE — Telephone Encounter (Signed)
 Spoke to patient. Patient schedules for Hep B series: #1 - Tue, 06/14/24 @ 8:30 AM & #2 - Thu, 07/14/24 @ 8:30 AM.

## 2024-06-01 ENCOUNTER — Encounter: Payer: Self-pay | Admitting: Oncology

## 2024-06-01 ENCOUNTER — Ambulatory Visit (HOSPITAL_COMMUNITY)
Admission: RE | Admit: 2024-06-01 | Discharge: 2024-06-01 | Disposition: A | Source: Ambulatory Visit | Attending: Gastroenterology

## 2024-06-01 DIAGNOSIS — D508 Other iron deficiency anemias: Secondary | ICD-10-CM

## 2024-06-01 DIAGNOSIS — R197 Diarrhea, unspecified: Secondary | ICD-10-CM

## 2024-06-01 DIAGNOSIS — R1084 Generalized abdominal pain: Secondary | ICD-10-CM

## 2024-06-01 DIAGNOSIS — G629 Polyneuropathy, unspecified: Secondary | ICD-10-CM

## 2024-06-01 DIAGNOSIS — K760 Fatty (change of) liver, not elsewhere classified: Secondary | ICD-10-CM

## 2024-06-01 DIAGNOSIS — Z9884 Bariatric surgery status: Secondary | ICD-10-CM

## 2024-06-01 DIAGNOSIS — R7989 Other specified abnormal findings of blood chemistry: Secondary | ICD-10-CM

## 2024-06-01 DIAGNOSIS — K219 Gastro-esophageal reflux disease without esophagitis: Secondary | ICD-10-CM

## 2024-06-01 DIAGNOSIS — R195 Other fecal abnormalities: Secondary | ICD-10-CM

## 2024-06-07 ENCOUNTER — Ambulatory Visit: Admitting: Family Medicine

## 2024-06-08 ENCOUNTER — Ambulatory Visit: Admitting: Student

## 2024-06-09 ENCOUNTER — Ambulatory Visit (HOSPITAL_COMMUNITY)

## 2024-06-14 ENCOUNTER — Ambulatory Visit

## 2024-06-16 ENCOUNTER — Ambulatory Visit (HOSPITAL_BASED_OUTPATIENT_CLINIC_OR_DEPARTMENT_OTHER): Admitting: Pulmonary Disease

## 2024-06-23 ENCOUNTER — Encounter: Admitting: Gastroenterology

## 2024-07-14 ENCOUNTER — Ambulatory Visit

## 2024-08-12 ENCOUNTER — Inpatient Hospital Stay

## 2024-08-19 ENCOUNTER — Inpatient Hospital Stay: Admitting: Oncology

## 2024-09-27 ENCOUNTER — Ambulatory Visit: Admitting: "Endocrinology
